# Patient Record
Sex: Male | Born: 1955 | Race: White | Hispanic: No | Marital: Married | Smoking: Never smoker
Health system: Southern US, Community
[De-identification: ages and names within clinical notes are randomized; demographics above are authoritative.]

## PROBLEM LIST (undated history)

## (undated) DIAGNOSIS — J309 Allergic rhinitis, unspecified: Secondary | ICD-10-CM

## (undated) DIAGNOSIS — K219 Gastro-esophageal reflux disease without esophagitis: Secondary | ICD-10-CM

## (undated) DIAGNOSIS — R809 Proteinuria, unspecified: Secondary | ICD-10-CM

## (undated) DIAGNOSIS — R638 Other symptoms and signs concerning food and fluid intake: Secondary | ICD-10-CM

## (undated) DIAGNOSIS — F419 Anxiety disorder, unspecified: Secondary | ICD-10-CM

## (undated) DIAGNOSIS — J449 Chronic obstructive pulmonary disease, unspecified: Secondary | ICD-10-CM

## (undated) DIAGNOSIS — G4730 Sleep apnea, unspecified: Secondary | ICD-10-CM

## (undated) DIAGNOSIS — G8929 Other chronic pain: Secondary | ICD-10-CM

## (undated) DIAGNOSIS — K2270 Barrett's esophagus without dysplasia: Secondary | ICD-10-CM

## (undated) DIAGNOSIS — H8109 Meniere's disease, unspecified ear: Secondary | ICD-10-CM

## (undated) DIAGNOSIS — IMO0001 Reserved for inherently not codable concepts without codable children: Secondary | ICD-10-CM

## (undated) DIAGNOSIS — F29 Unspecified psychosis not due to a substance or known physiological condition: Secondary | ICD-10-CM

## (undated) DIAGNOSIS — E7800 Pure hypercholesterolemia, unspecified: Secondary | ICD-10-CM

## (undated) DIAGNOSIS — E875 Hyperkalemia: Secondary | ICD-10-CM

## (undated) DIAGNOSIS — D649 Anemia, unspecified: Secondary | ICD-10-CM

## (undated) DIAGNOSIS — E785 Hyperlipidemia, unspecified: Secondary | ICD-10-CM

## (undated) DIAGNOSIS — Z9889 Other specified postprocedural states: Secondary | ICD-10-CM

## (undated) DIAGNOSIS — N2581 Secondary hyperparathyroidism of renal origin: Secondary | ICD-10-CM

## (undated) DIAGNOSIS — M5030 Other cervical disc degeneration, unspecified cervical region: Secondary | ICD-10-CM

## (undated) DIAGNOSIS — J40 Bronchitis, not specified as acute or chronic: Secondary | ICD-10-CM

## (undated) DIAGNOSIS — S060X9A Concussion with loss of consciousness of unspecified duration, initial encounter: Secondary | ICD-10-CM

## (undated) DIAGNOSIS — Z8669 Personal history of other diseases of the nervous system and sense organs: Secondary | ICD-10-CM

## (undated) DIAGNOSIS — C649 Malignant neoplasm of unspecified kidney, except renal pelvis: Secondary | ICD-10-CM

## (undated) DIAGNOSIS — G709 Myoneural disorder, unspecified: Secondary | ICD-10-CM

## (undated) DIAGNOSIS — N289 Disorder of kidney and ureter, unspecified: Secondary | ICD-10-CM

## (undated) DIAGNOSIS — M549 Dorsalgia, unspecified: Secondary | ICD-10-CM

## (undated) DIAGNOSIS — I2510 Atherosclerotic heart disease of native coronary artery without angina pectoris: Secondary | ICD-10-CM

## (undated) DIAGNOSIS — I10 Essential (primary) hypertension: Secondary | ICD-10-CM

## (undated) DIAGNOSIS — R1310 Dysphagia, unspecified: Secondary | ICD-10-CM

## (undated) DIAGNOSIS — E119 Type 2 diabetes mellitus without complications: Secondary | ICD-10-CM

## (undated) DIAGNOSIS — I219 Acute myocardial infarction, unspecified: Secondary | ICD-10-CM

## (undated) DIAGNOSIS — I209 Angina pectoris, unspecified: Secondary | ICD-10-CM

## (undated) HISTORY — PX: APPENDECTOMY: SHX54

## (undated) HISTORY — PX: CORONARY ANGIOPLASTY: SHX604

## (undated) HISTORY — PX: SPINAL CORD STIMULATOR IMPLANT: SHX2422

## (undated) HISTORY — PX: LABRINTHECTOMY: SHX1901

## (undated) HISTORY — PX: NEPHRECTOMY: SHX65

## (undated) HISTORY — DX: Concussion with loss of consciousness of unspecified duration, initial encounter: S06.0X9A

## (undated) HISTORY — DX: Unspecified psychosis not due to a substance or known physiological condition: F29

## (undated) HISTORY — PX: BACK SURGERY: SHX140

## (undated) HISTORY — DX: Chronic obstructive pulmonary disease, unspecified: J44.9

## (undated) HISTORY — PX: CARDIAC CATHETERIZATION: SHX172

## (undated) HISTORY — DX: Pure hypercholesterolemia, unspecified: E78.00

## (undated) HISTORY — DX: Personal history of other diseases of the nervous system and sense organs: Z86.69

## (undated) HISTORY — PX: CARDIOVASCULAR STRESS TEST: SHX262

## (undated) HISTORY — PX: OTHER SURGICAL HISTORY: SHX169

## (undated) HISTORY — DX: Reserved for inherently not codable concepts without codable children: IMO0001

## (undated) HISTORY — DX: Other symptoms and signs concerning food and fluid intake: R63.8

## (undated) HISTORY — DX: Other specified postprocedural states: Z98.890

## (undated) HISTORY — DX: Allergic rhinitis, unspecified: J30.9

---

## 2002-07-17 ENCOUNTER — Encounter: Payer: Self-pay | Admitting: Emergency Medicine

## 2002-07-17 ENCOUNTER — Emergency Department (HOSPITAL_COMMUNITY): Admission: AD | Admit: 2002-07-17 | Discharge: 2002-07-17 | Payer: Self-pay | Admitting: Emergency Medicine

## 2003-04-03 ENCOUNTER — Other Ambulatory Visit: Payer: Self-pay

## 2004-07-04 ENCOUNTER — Emergency Department: Payer: Self-pay | Admitting: Emergency Medicine

## 2004-08-10 ENCOUNTER — Ambulatory Visit: Payer: Self-pay | Admitting: Internal Medicine

## 2004-10-18 ENCOUNTER — Ambulatory Visit: Payer: Self-pay | Admitting: Unknown Physician Specialty

## 2004-10-31 ENCOUNTER — Ambulatory Visit: Payer: Self-pay | Admitting: Unknown Physician Specialty

## 2004-11-18 ENCOUNTER — Ambulatory Visit: Payer: Self-pay | Admitting: Unknown Physician Specialty

## 2004-12-19 ENCOUNTER — Ambulatory Visit: Payer: Self-pay | Admitting: Unknown Physician Specialty

## 2005-08-10 ENCOUNTER — Emergency Department: Payer: Self-pay | Admitting: Unknown Physician Specialty

## 2007-05-07 ENCOUNTER — Emergency Department: Payer: Self-pay | Admitting: Unknown Physician Specialty

## 2007-05-07 ENCOUNTER — Other Ambulatory Visit: Payer: Self-pay

## 2007-05-09 ENCOUNTER — Other Ambulatory Visit: Payer: Self-pay

## 2007-05-09 ENCOUNTER — Inpatient Hospital Stay: Payer: Self-pay | Admitting: Internal Medicine

## 2007-06-15 ENCOUNTER — Ambulatory Visit: Payer: Self-pay | Admitting: Anesthesiology

## 2007-07-14 ENCOUNTER — Ambulatory Visit: Payer: Self-pay | Admitting: Thoracic Surgery

## 2007-10-22 ENCOUNTER — Encounter: Payer: Self-pay | Admitting: Neurosurgery

## 2007-11-19 ENCOUNTER — Encounter: Payer: Self-pay | Admitting: Neurosurgery

## 2007-12-01 ENCOUNTER — Ambulatory Visit: Payer: Self-pay | Admitting: Anesthesiology

## 2007-12-20 ENCOUNTER — Encounter: Payer: Self-pay | Admitting: Neurosurgery

## 2007-12-29 ENCOUNTER — Ambulatory Visit: Payer: Self-pay | Admitting: Anesthesiology

## 2008-01-20 ENCOUNTER — Ambulatory Visit: Payer: Self-pay | Admitting: Anesthesiology

## 2008-02-25 ENCOUNTER — Encounter: Payer: Self-pay | Admitting: Orthopedic Surgery

## 2008-02-25 ENCOUNTER — Ambulatory Visit: Payer: Self-pay | Admitting: Anesthesiology

## 2008-04-07 ENCOUNTER — Ambulatory Visit: Payer: Self-pay | Admitting: Anesthesiology

## 2008-04-19 ENCOUNTER — Ambulatory Visit: Payer: Self-pay | Admitting: Unknown Physician Specialty

## 2008-05-16 ENCOUNTER — Ambulatory Visit: Payer: Self-pay | Admitting: Anesthesiology

## 2008-06-06 ENCOUNTER — Ambulatory Visit: Payer: Self-pay | Admitting: Unknown Physician Specialty

## 2008-06-24 ENCOUNTER — Ambulatory Visit: Payer: Self-pay | Admitting: Anesthesiology

## 2008-07-18 ENCOUNTER — Ambulatory Visit: Payer: Self-pay | Admitting: Anesthesiology

## 2008-08-18 ENCOUNTER — Ambulatory Visit: Payer: Self-pay | Admitting: Anesthesiology

## 2008-09-19 ENCOUNTER — Ambulatory Visit: Payer: Self-pay | Admitting: Anesthesiology

## 2008-11-14 ENCOUNTER — Ambulatory Visit: Payer: Self-pay | Admitting: Pain Medicine

## 2008-11-28 ENCOUNTER — Ambulatory Visit: Payer: Self-pay | Admitting: Pain Medicine

## 2009-02-20 ENCOUNTER — Ambulatory Visit: Payer: Self-pay | Admitting: Pain Medicine

## 2009-03-14 ENCOUNTER — Ambulatory Visit: Payer: Self-pay | Admitting: Physician Assistant

## 2009-04-12 ENCOUNTER — Ambulatory Visit: Payer: Self-pay | Admitting: Pain Medicine

## 2009-04-27 ENCOUNTER — Ambulatory Visit: Payer: Self-pay | Admitting: Pain Medicine

## 2009-05-10 ENCOUNTER — Ambulatory Visit: Payer: Self-pay | Admitting: Pain Medicine

## 2009-05-18 ENCOUNTER — Encounter: Payer: Self-pay | Admitting: Rehabilitation

## 2009-05-19 ENCOUNTER — Encounter: Payer: Self-pay | Admitting: Rehabilitation

## 2009-06-01 ENCOUNTER — Ambulatory Visit: Payer: Self-pay | Admitting: Pain Medicine

## 2009-06-18 ENCOUNTER — Encounter: Payer: Self-pay | Admitting: Rehabilitation

## 2009-07-10 ENCOUNTER — Ambulatory Visit: Payer: Self-pay | Admitting: Pain Medicine

## 2009-12-07 ENCOUNTER — Ambulatory Visit: Payer: Self-pay | Admitting: Pain Medicine

## 2009-12-20 ENCOUNTER — Ambulatory Visit: Payer: Self-pay | Admitting: Pain Medicine

## 2010-02-18 DIAGNOSIS — I219 Acute myocardial infarction, unspecified: Secondary | ICD-10-CM

## 2010-02-18 HISTORY — DX: Acute myocardial infarction, unspecified: I21.9

## 2010-06-04 ENCOUNTER — Ambulatory Visit: Payer: Self-pay | Admitting: Unknown Physician Specialty

## 2010-06-05 LAB — PATHOLOGY REPORT

## 2010-06-10 ENCOUNTER — Observation Stay: Payer: Self-pay | Admitting: Internal Medicine

## 2010-06-19 ENCOUNTER — Ambulatory Visit: Payer: Self-pay | Admitting: Internal Medicine

## 2010-07-03 ENCOUNTER — Encounter: Payer: Self-pay | Admitting: Internal Medicine

## 2010-07-03 NOTE — Letter (Signed)
Jul 14, 2007   Blima Dessert, MD  [address] [place], [place] [address]   Re:  Kenneth Osborn, Kenneth Osborn                 DOB:  [birth year]   Dear Dr. Arnoldo Morale:   I saw Kenneth Osborn in the office today.  This 55 year old patient was  worked up in [facility] for severe back pain 5 weeks ago.  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  He had an MRI which showed a  ruptured central disk at T8-9.  He saw you in consultation and was  recommended to have a diskectomy. In the meantime, he developed an upper  respiratory infection and went to [facility] where he was admitted  with questionable swine flu.  During that time, he had some workup there  and has been referred, I guess, to some other surgeon at [facility] for a  second opinion. He is having some weakness in his legs and numbness and  some urinary hesitancy and stumbling.  He feels that this pain is  getting worse.   PAST MEDICAL HISTORY:  Significant for:  1. Meniere's disease bilaterally for which he had a labyrinthectomy on      the right and some type of shunt on the left side.  2. 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  3. He has hypercholesterolemia.  4. Appendicitis.  5. Gastroesophageal reflux.  6. History of a fractured hip in the past.   ALLERGIES:  HYDROMORPHONE which causes a rash, but he was also taking  Dilaudid without problem.   MEDICATIONS:  1. Enalapril 2.5 mg day.  2. Triamterene 75/50 one daily.  3. Cyclobenzaprine 10 mg three times a day.  4. Diazepam 5 mg a day.  5. Meclizine  25 mg three times a  day.  6. Neurontin 1200 mg twice a day.  7. Lipitor 80 mg a day.  8. Nexium 40 mg twice a day.  9. Singulair 10 mg a day.  10.Lantus 50 units daily.  11.Janumet 40 mg twice a day.  12.Glyburide 4 mg daily.   FAMILY HISTORY:  Positive for vascular disease.   SOCIAL HISTORY:  He is married.  He is disabled and does not smoke or  drink.   REVIEW OF SYSTEMS:  He is 222 pounds and 5 feet 10 inches.  GENERAL:  His weights has been stable.  CARDIAC:  No angina or atrial  fibrillation.  PULMONARY:  No hemoptysis, fever, chills, asthma or  wheezing.  GI:  He has some reflux.  GU:  He has frequent urination.  No  dysuria or kidney disease.  VASCULAR:  No claudication, DVT, TIAs.  NEUROLOGICAL:  No dizziness, headaches, blackouts, seizures.  MUSCULOSKELETAL: No arthritis or joint pain.  PSYCHIATRIC:  No problems  with depression or nerves.  EYE AND ENT:  No changes in eyesight but see  Past Medical History or hearing.  HEMATOLOGIC:  No problems with  bleeding, clotting disorders or anemia.   PHYSICAL EXAMINATION:  VITAL SIGNS:  Blood pressure is 138/88, pulse  100, respirations 20, saturation 98%.  HEAD, EYES, EARS, NOSE AND THROAT:  Unremarkable except for surgical  scars on both his right this left ear in the mastoid area.  NECK:  Supple without thyromegaly.  There is no supraclavicular or  axillary adenopathy.  CHEST:  Clear to auscultation and  percussion.  HEART:  Regular sinus rhythm.  No murmurs.  ABDOMEN:  Soft.  There is no splenomegaly.  EXTREMITIES:  Pulses 2+.  There is no clubbing or edema.  NEUROLOGICAL:  He is oriented x3.  Sensory and motor intact.  Did not  have any definite weakness in either his right or his left leg.  SKIN:  Without lesions.   I discussed the thoracic exposure for Kenneth Osborn.  I think probably the  left side would be the better way to go, although we could also approach  this from the right side.  Essentially, the left would probably be the  better exposure.  I told that we would only have to do a rib resection  by another fusion.   I will be happy to do it at any time just as soon as he lets you know  when he wants to do surgery.  I appreciate the opportunity of seeing Mr.  Osborn.   Sincerely,   Nicanor Alcon, M.D.  Electronically Signed   DPB/MEDQ  D:  07/14/2007  T:   07/14/2007  Job:  [id number]

## 2010-07-20 ENCOUNTER — Encounter: Payer: Self-pay | Admitting: Internal Medicine

## 2010-08-06 ENCOUNTER — Emergency Department: Payer: Self-pay | Admitting: Emergency Medicine

## 2010-08-08 ENCOUNTER — Ambulatory Visit: Payer: Self-pay | Admitting: Pain Medicine

## 2010-08-14 ENCOUNTER — Ambulatory Visit: Payer: Self-pay | Admitting: Pain Medicine

## 2010-08-19 ENCOUNTER — Encounter: Payer: Self-pay | Admitting: Internal Medicine

## 2010-08-27 ENCOUNTER — Ambulatory Visit: Payer: Self-pay | Admitting: Pain Medicine

## 2010-09-17 ENCOUNTER — Ambulatory Visit: Payer: Self-pay | Admitting: Pain Medicine

## 2010-09-19 ENCOUNTER — Encounter: Payer: Self-pay | Admitting: Internal Medicine

## 2010-10-20 ENCOUNTER — Encounter: Payer: Self-pay | Admitting: Internal Medicine

## 2010-11-20 ENCOUNTER — Ambulatory Visit: Payer: Self-pay | Admitting: Internal Medicine

## 2010-11-23 ENCOUNTER — Other Ambulatory Visit: Payer: Self-pay

## 2010-12-03 ENCOUNTER — Encounter: Payer: Self-pay | Admitting: Internal Medicine

## 2010-12-10 ENCOUNTER — Ambulatory Visit: Payer: Self-pay | Admitting: Pain Medicine

## 2010-12-20 ENCOUNTER — Encounter: Payer: Self-pay | Admitting: Internal Medicine

## 2011-01-19 ENCOUNTER — Encounter: Payer: Self-pay | Admitting: Internal Medicine

## 2011-02-24 ENCOUNTER — Observation Stay: Payer: Self-pay | Admitting: Internal Medicine

## 2011-02-24 LAB — CBC
HCT: 44.1 % (ref 40.0–52.0)
HGB: 15 g/dL (ref 13.0–18.0)
MCH: 28.5 pg (ref 26.0–34.0)
MCHC: 34 g/dL (ref 32.0–36.0)
MCV: 84 fL (ref 80–100)
RBC: 5.26 10*6/uL (ref 4.40–5.90)

## 2011-02-24 LAB — CK TOTAL AND CKMB (NOT AT ARMC)
CK, Total: 147 U/L (ref 35–232)
CK-MB: 2.1 ng/mL (ref 0.5–3.6)

## 2011-02-24 LAB — BASIC METABOLIC PANEL
Anion Gap: 12 (ref 7–16)
BUN: 26 mg/dL — ABNORMAL HIGH (ref 7–18)
Creatinine: 1.61 mg/dL — ABNORMAL HIGH (ref 0.60–1.30)
EGFR (African American): 57 — ABNORMAL LOW
Osmolality: 294 (ref 275–301)

## 2011-02-24 LAB — TROPONIN I: Troponin-I: <0.02 ng/mL

## 2011-02-24 LAB — MAGNESIUM: Magnesium: 1.3 mg/dL — ABNORMAL LOW

## 2011-02-25 LAB — BASIC METABOLIC PANEL
BUN: 27 mg/dL — ABNORMAL HIGH (ref 7–18)
Chloride: 103 mmol/L (ref 98–107)
Creatinine: 1.46 mg/dL — ABNORMAL HIGH (ref 0.60–1.30)
Potassium: 4.2 mmol/L (ref 3.5–5.1)
Sodium: 142 mmol/L (ref 136–145)

## 2011-02-25 LAB — CBC WITH DIFFERENTIAL/PLATELET
Basophil %: 0.7 %
Eosinophil %: 2 %
HCT: 40.3 % (ref 40.0–52.0)
HGB: 13.8 g/dL (ref 13.0–18.0)
Lymphocyte %: 47.8 %
MCH: 28.7 pg (ref 26.0–34.0)
MCHC: 34.3 g/dL (ref 32.0–36.0)
Monocyte #: 0.7 10*3/uL (ref 0.0–0.7)
Neutrophil #: 3.2 10*3/uL (ref 1.4–6.5)
RBC: 4.82 10*6/uL (ref 4.40–5.90)

## 2011-02-25 LAB — TROPONIN I
Troponin-I: <0.02 ng/mL
Troponin-I: <0.02 ng/mL

## 2011-02-25 LAB — APTT: Activated PTT: 88.7 secs — ABNORMAL HIGH (ref 23.6–35.9)

## 2011-03-11 ENCOUNTER — Ambulatory Visit: Payer: Self-pay | Admitting: Pain Medicine

## 2011-03-19 ENCOUNTER — Encounter: Payer: Self-pay | Admitting: Internal Medicine

## 2011-03-22 ENCOUNTER — Encounter: Payer: Self-pay | Admitting: Internal Medicine

## 2011-04-19 ENCOUNTER — Encounter: Payer: Self-pay | Admitting: Internal Medicine

## 2011-06-19 ENCOUNTER — Ambulatory Visit: Payer: Self-pay | Admitting: Pain Medicine

## 2011-10-09 ENCOUNTER — Ambulatory Visit: Payer: Self-pay | Admitting: Pain Medicine

## 2012-02-05 ENCOUNTER — Ambulatory Visit: Payer: Self-pay | Admitting: Pain Medicine

## 2012-02-10 ENCOUNTER — Ambulatory Visit: Payer: Self-pay

## 2012-03-16 DIAGNOSIS — M25519 Pain in unspecified shoulder: Secondary | ICD-10-CM | POA: Insufficient documentation

## 2012-03-25 ENCOUNTER — Other Ambulatory Visit: Payer: Self-pay | Admitting: Orthopedic Surgery

## 2012-04-01 ENCOUNTER — Encounter (HOSPITAL_COMMUNITY)
Admission: RE | Admit: 2012-04-01 | Discharge: 2012-04-01 | Disposition: A | Discharge status: Home / Self Care | Payer: Managed Care, Other (non HMO) | Source: Ambulatory Visit | Attending: Orthopedic Surgery | Admitting: Orthopedic Surgery

## 2012-04-01 ENCOUNTER — Encounter (HOSPITAL_COMMUNITY)
Admission: RE | Admit: 2012-04-01 | Discharge: 2012-04-01 | Disposition: A | Discharge status: Home / Self Care | Payer: Managed Care, Other (non HMO) | Source: Ambulatory Visit | Attending: Anesthesiology | Admitting: Anesthesiology

## 2012-04-01 ENCOUNTER — Encounter (HOSPITAL_COMMUNITY): Payer: Self-pay | Admitting: Pharmacy Technician

## 2012-04-01 ENCOUNTER — Encounter (HOSPITAL_COMMUNITY): Payer: Self-pay

## 2012-04-01 HISTORY — DX: Atherosclerotic heart disease of native coronary artery without angina pectoris: I25.10

## 2012-04-01 HISTORY — DX: Meniere's disease, unspecified ear: H81.09

## 2012-04-01 HISTORY — DX: Dorsalgia, unspecified: M54.9

## 2012-04-01 HISTORY — DX: Essential (primary) hypertension: I10

## 2012-04-01 HISTORY — DX: Other chronic pain: G89.29

## 2012-04-01 HISTORY — DX: Bronchitis, not specified as acute or chronic: J40

## 2012-04-01 HISTORY — DX: Acute myocardial infarction, unspecified: I21.9

## 2012-04-01 HISTORY — DX: Gastro-esophageal reflux disease without esophagitis: K21.9

## 2012-04-01 HISTORY — DX: Sleep apnea, unspecified: G47.30

## 2012-04-01 HISTORY — DX: Myoneural disorder, unspecified: G70.9

## 2012-04-01 HISTORY — DX: Type 2 diabetes mellitus without complications: E11.9

## 2012-04-01 LAB — SURGICAL PCR SCREEN: Staphylococcus aureus: POSITIVE — AB

## 2012-04-01 LAB — PROTIME-INR: Prothrombin Time: 12 seconds (ref 11.6–15.2)

## 2012-04-01 LAB — BASIC METABOLIC PANEL
BUN: 24 mg/dL — ABNORMAL HIGH (ref 6–23)
Calcium: 10.2 mg/dL (ref 8.4–10.5)
Creatinine, Ser: 1.13 mg/dL (ref 0.50–1.35)
GFR calc non Af Amer: 70 mL/min — ABNORMAL LOW (ref >90)
Glucose, Bld: 137 mg/dL — ABNORMAL HIGH (ref 70–99)
Potassium: 4.3 mEq/L (ref 3.5–5.1)

## 2012-04-01 LAB — CBC
Hemoglobin: 15.2 g/dL (ref 13.0–17.0)
MCH: 28.1 pg (ref 26.0–34.0)
MCHC: 34.9 g/dL (ref 30.0–36.0)
RDW: 13.5 % (ref 11.5–15.5)

## 2012-04-01 MED ORDER — CHLORHEXIDINE GLUCONATE 4 % EX LIQD: 60.0000 mL | Freq: Once | CUTANEOUS | Status: DC

## 2012-04-01 NOTE — Pre-Procedure Instructions (Signed)
Kamen Hanken  04/01/2012   Your procedure is scheduled on:  Monday April 06, 2012  Report to [facility] at 10:00 AM.  Call this number if you have problems the morning of surgery: [phone]   Remember:   Do not eat food or drink liquids after midnight.   Take these medicines the morning of surgery with A SIP OF WATER: nexium, gabapentin, oxycodone, Nitrostat ( if needed), meclizine, diazepam, certirizine, metoprolol, dilitazem, isosorbide   Do not wear jewelry, make-up or nail polish.  Do not wear lotions, powders, or perfumes.  Do not shave 48 hours prior to surgery. Men may shave face and neck.  Do not bring valuables to the hospital.  Contacts, dentures or bridgework may not be worn into surgery.  Leave suitcase in the car. After surgery it may be brought to your room.  For patients admitted to the hospital, checkout time is 11:00 AM the day of  discharge.   Patients discharged the day of surgery will not be allowed to drive home.  Name and phone number of your driver: family / friend  Special Instructions: Shower using CHG 2 nights before surgery and the night before surgery.  If you shower the day of surgery use CHG.  Use special wash - you have one bottle of CHG for all showers.  You should use approximately 1/3 of the bottle for each shower.   Please read over the following fact sheets that you were given: Pain Booklet, Coughing and Deep Breathing, MRSA Information and Surgical Site Infection Prevention

## 2012-04-01 NOTE — Progress Notes (Signed)
Faxed Request to Dr. Ander Purpura office, requesting last office visit notes, EKG, stress test, ECho, and cardiac cath if available.

## 2012-04-02 ENCOUNTER — Encounter (HOSPITAL_COMMUNITY): Payer: Self-pay

## 2012-04-02 NOTE — Consult Note (Signed)
Anesthesia Chart Review:  Patient is a 57 year old male scheduled for left shoulder arthroscopy, subacromial decompression by Dr. Ronnie Derby on 04/06/12.  History includes obesity, non-smoker, CAD/MI s/p LAD stent with PCI LAD for restenosis '11/2010, DM2, HTN, GERD, OSA.  Cardiologist is Dr. [name], records pending, but Dr. Ruel Favors office did receive a note of cardiac clearance with permission for Effient to be held 5-7 days preoperatively.  EKG on 04/01/12 showed NSR, cannot rule out anterior infarct (age undetermined).  Poor r wave progression appears new since his EKG on 02/26/11 [facility]).  Cardiac cath on 02/25/11 [facility]) showed Proximal LAD:  10% stenosis at the site of a prior stent.  Mid LAD:  60% stenosis.  1st DIAG:  60% stenosis.  Proximal CX: 70% stenosis.  Mid CX: 60% stenosis.  OM1: 30% stenosis.  Proximal RCA 30% stenosis.  Recommendations: "Continue medical mgt of cad with consider pci of lcx artery at this time with concerns for small caliber artery."  He had a normal nuclear stress test at [facility] on 04/12/10 showing no evidence of ischemia, EF 72%.  CXR on 04/01/12 showed minimal chronic bronchitic changes.  Preoperative labs noted.  Additional cardiac records are pending from [facility], but his last cath and clearance note are on his surgical chart.  Additional records, if received, can be reviewed by his anesthesiologist on the day of surgery.  Myra Gianotti, PA-C 04/03/12 1138

## 2012-04-06 ENCOUNTER — Ambulatory Visit (HOSPITAL_COMMUNITY)
Admission: RE | Admit: 2012-04-06 | Discharge: 2012-04-06 | Disposition: A | Discharge status: Home / Self Care | Payer: Managed Care, Other (non HMO) | Source: Ambulatory Visit | Attending: Orthopedic Surgery | Admitting: Orthopedic Surgery

## 2012-04-06 ENCOUNTER — Encounter (HOSPITAL_COMMUNITY): Payer: Self-pay | Admitting: Vascular Surgery

## 2012-04-06 ENCOUNTER — Ambulatory Visit (HOSPITAL_COMMUNITY): Payer: Managed Care, Other (non HMO) | Admitting: Vascular Surgery

## 2012-04-06 ENCOUNTER — Encounter (HOSPITAL_COMMUNITY): Payer: Self-pay | Admitting: *Deleted

## 2012-04-06 ENCOUNTER — Encounter (HOSPITAL_COMMUNITY)
Admission: RE | Disposition: A | Discharge status: Home / Self Care | Payer: Self-pay | Source: Ambulatory Visit | Attending: Orthopedic Surgery

## 2012-04-06 DIAGNOSIS — Z794 Long term (current) use of insulin: Secondary | ICD-10-CM | POA: Insufficient documentation

## 2012-04-06 DIAGNOSIS — M19019 Primary osteoarthritis, unspecified shoulder: Secondary | ICD-10-CM | POA: Insufficient documentation

## 2012-04-06 DIAGNOSIS — I10 Essential (primary) hypertension: Secondary | ICD-10-CM | POA: Insufficient documentation

## 2012-04-06 DIAGNOSIS — E119 Type 2 diabetes mellitus without complications: Secondary | ICD-10-CM | POA: Insufficient documentation

## 2012-04-06 DIAGNOSIS — Z01818 Encounter for other preprocedural examination: Secondary | ICD-10-CM | POA: Insufficient documentation

## 2012-04-06 DIAGNOSIS — M25519 Pain in unspecified shoulder: Secondary | ICD-10-CM

## 2012-04-06 DIAGNOSIS — Z01812 Encounter for preprocedural laboratory examination: Secondary | ICD-10-CM | POA: Insufficient documentation

## 2012-04-06 DIAGNOSIS — M25819 Other specified joint disorders, unspecified shoulder: Secondary | ICD-10-CM | POA: Insufficient documentation

## 2012-04-06 DIAGNOSIS — Z01810 Encounter for preprocedural cardiovascular examination: Secondary | ICD-10-CM | POA: Insufficient documentation

## 2012-04-06 HISTORY — PX: SHOULDER ARTHROSCOPY WITH SUBACROMIAL DECOMPRESSION: SHX5684

## 2012-04-06 LAB — APTT: aPTT: 27 seconds (ref 24–37)

## 2012-04-06 LAB — GLUCOSE, CAPILLARY: Glucose-Capillary: 136 mg/dL — ABNORMAL HIGH (ref 70–99)

## 2012-04-06 SURGERY — SHOULDER ARTHROSCOPY WITH SUBACROMIAL DECOMPRESSION
Anesthesia: Regional | Site: Shoulder | Laterality: Left | Wound class: Clean

## 2012-04-06 MED ORDER — STERILE WATER FOR IRRIGATION IR SOLN
Status: DC | PRN
  Administered 2012-04-06: 1000 mL

## 2012-04-06 MED ORDER — BUPIVACAINE HCL (PF) 0.5 % IJ SOLN
INTRAMUSCULAR | Status: DC | PRN
  Administered 2012-04-06: 30 mL

## 2012-04-06 MED ORDER — LACTATED RINGERS IV SOLN
INTRAVENOUS | Status: DC
  Administered 2012-04-06: 11:00:00 via INTRAVENOUS

## 2012-04-06 MED ORDER — FENTANYL CITRATE 0.05 MG/ML IJ SOLN
INTRAMUSCULAR | Status: AC
  Administered 2012-04-06: 100 ug via INTRAVENOUS
  Filled 2012-04-06: qty 2

## 2012-04-06 MED ORDER — FENTANYL CITRATE 0.05 MG/ML IJ SOLN
50.0000 ug | INTRAMUSCULAR | Status: DC | PRN
  Administered 2012-04-06: 100 ug via INTRAVENOUS

## 2012-04-06 MED ORDER — FENTANYL CITRATE 0.05 MG/ML IJ SOLN
INTRAMUSCULAR | Status: DC | PRN
  Administered 2012-04-06: 150 ug via INTRAVENOUS

## 2012-04-06 MED ORDER — PROPOFOL 10 MG/ML IV BOLUS
INTRAVENOUS | Status: DC | PRN
  Administered 2012-04-06: 200 mg via INTRAVENOUS

## 2012-04-06 MED ORDER — LACTATED RINGERS IV SOLN
INTRAVENOUS | Status: DC | PRN
  Administered 2012-04-06 (×2): via INTRAVENOUS

## 2012-04-06 MED ORDER — NEOSTIGMINE METHYLSULFATE 1 MG/ML IJ SOLN
INTRAMUSCULAR | Status: DC | PRN
  Administered 2012-04-06: 3 mg via INTRAVENOUS

## 2012-04-06 MED ORDER — MUPIROCIN 2 % EX OINT
TOPICAL_OINTMENT | Freq: Two times a day (BID) | CUTANEOUS | Status: DC
  Administered 2012-04-06: 1 via NASAL
  Filled 2012-04-06: qty 22

## 2012-04-06 MED ORDER — MIDAZOLAM HCL 2 MG/2ML IJ SOLN
INTRAMUSCULAR | Status: AC
  Administered 2012-04-06: 2 mg via INTRAVENOUS
  Filled 2012-04-06: qty 2

## 2012-04-06 MED ORDER — LIDOCAINE HCL (CARDIAC) 20 MG/ML IV SOLN
INTRAVENOUS | Status: DC | PRN
  Administered 2012-04-06: 50 mg via INTRAVENOUS

## 2012-04-06 MED ORDER — GLYCOPYRROLATE 0.2 MG/ML IJ SOLN
INTRAMUSCULAR | Status: DC | PRN
  Administered 2012-04-06: 0.6 mg via INTRAVENOUS

## 2012-04-06 MED ORDER — MIDAZOLAM HCL 2 MG/2ML IJ SOLN
1.0000 mg | INTRAMUSCULAR | Status: DC | PRN
  Administered 2012-04-06: 2 mg via INTRAVENOUS

## 2012-04-06 MED ORDER — MUPIROCIN 2 % EX OINT
TOPICAL_OINTMENT | CUTANEOUS | Status: AC
  Administered 2012-04-06: 1 via NASAL
  Filled 2012-04-06: qty 22

## 2012-04-06 MED ORDER — ROCURONIUM BROMIDE 100 MG/10ML IV SOLN
INTRAVENOUS | Status: DC | PRN
  Administered 2012-04-06: 50 mg via INTRAVENOUS

## 2012-04-06 SURGICAL SUPPLY — 43 items
BIT DRILL TAK (DRILL) IMPLANT
BLADE CUTTER GATOR 3.5 (BLADE) ×2 IMPLANT
BLADE SURG 11 STRL SS (BLADE) ×2 IMPLANT
BUR OVAL 4.0 (BURR) ×2 IMPLANT
CANNULA SHOULDER 7CM (CANNULA) ×2 IMPLANT
CLOTH BEACON ORANGE TIMEOUT ST (SAFETY) ×2 IMPLANT
CLSR STERI-STRIP ANTIMIC 1/2X4 (GAUZE/BANDAGES/DRESSINGS) ×2 IMPLANT
DRAPE U-SHAPE 47X51 STRL (DRAPES) ×2 IMPLANT
DRILL TAK (DRILL)
DRSG EMULSION OIL 3X3 NADH (GAUZE/BANDAGES/DRESSINGS) ×2 IMPLANT
DRSG PAD ABDOMINAL 8X10 ST (GAUZE/BANDAGES/DRESSINGS) ×2 IMPLANT
DURAPREP 26ML APPLICATOR (WOUND CARE) ×2 IMPLANT
GLOVE BIO SURGEON STRL SZ8.5 (GLOVE) ×2 IMPLANT
GLOVE BIOGEL PI IND STRL 8.5 (GLOVE) ×1 IMPLANT
GLOVE BIOGEL PI INDICATOR 8.5 (GLOVE) ×1
GLOVE SURG ORTHO 8.0 STRL STRW (GLOVE) ×4 IMPLANT
GLOVE SURG SS PI 8.5 STRL IVOR (GLOVE) ×1
GLOVE SURG SS PI 8.5 STRL STRW (GLOVE) ×1 IMPLANT
GOWN PREVENTION PLUS XLARGE (GOWN DISPOSABLE) ×2 IMPLANT
GOWN PREVENTION PLUS XXLARGE (GOWN DISPOSABLE) ×2 IMPLANT
GOWN STRL NON-REIN LRG LVL3 (GOWN DISPOSABLE) ×2 IMPLANT
KIT BASIN OR (CUSTOM PROCEDURE TRAY) ×2 IMPLANT
KIT ROOM TURNOVER OR (KITS) ×2 IMPLANT
MANIFOLD NEPTUNE II (INSTRUMENTS) ×2 IMPLANT
NEEDLE HYPO 25GX1X1/2 BEV (NEEDLE) ×2 IMPLANT
NEEDLE SPNL 18GX3.5 QUINCKE PK (NEEDLE) ×2 IMPLANT
PACK SHOULDER (CUSTOM PROCEDURE TRAY) ×2 IMPLANT
PAD ARMBOARD 7.5X6 YLW CONV (MISCELLANEOUS) ×4 IMPLANT
SET ARTHROSCOPY TUBING (MISCELLANEOUS) ×1
SET ARTHROSCOPY TUBING LN (MISCELLANEOUS) ×1 IMPLANT
SLING ARM FOAM STRAP LRG (SOFTGOODS) ×2 IMPLANT
SPEAR FASTAKII (SLEEVE) IMPLANT
SPONGE GAUZE 4X4 12PLY (GAUZE/BANDAGES/DRESSINGS) ×2 IMPLANT
SPONGE LAP 4X18 X RAY DECT (DISPOSABLE) ×4 IMPLANT
STRIP CLOSURE SKIN 1/2X4 (GAUZE/BANDAGES/DRESSINGS) IMPLANT
SUT ETHIBOND 2 OS 4 DA (SUTURE) IMPLANT
SUT ETHILON 3 0 PS 1 (SUTURE) ×2 IMPLANT
SYR 50ML SLIP (SYRINGE) IMPLANT
SYR CONTROL 10ML LL (SYRINGE) IMPLANT
TAPE CLOTH SURG 6X10 WHT LF (GAUZE/BANDAGES/DRESSINGS) ×2 IMPLANT
TOWEL OR 17X24 6PK STRL BLUE (TOWEL DISPOSABLE) ×4 IMPLANT
WAND 90 DEG TURBOVAC W/CORD (SURGICAL WAND) ×2 IMPLANT
WATER STERILE IRR 1000ML POUR (IV SOLUTION) ×2 IMPLANT

## 2012-04-06 NOTE — Anesthesia Procedure Notes (Addendum)
Procedure Name: Intubation Date/Time: 04/06/2012 12:22 PM Performed by: Eligha Bridegroom Pre-anesthesia Checklist: Patient identified, Timeout performed, Emergency Drugs available, Suction available and Patient being monitored Patient Re-evaluated:Patient Re-evaluated prior to inductionOxygen Delivery Method: Circle system utilized Preoxygenation: Pre-oxygenation with 100% oxygen Intubation Type: IV induction Ventilation: Mask ventilation without difficulty and Oral airway inserted - appropriate to patient size Laryngoscope Size: Mac and 4 Grade View: Grade I Tube type: Oral Number of attempts: 1 Airway Equipment and Method: Stylet Placement Confirmation: ETT inserted through vocal cords under direct vision,  breath sounds checked- equal and bilateral and positive ETCO2 Secured at: 22 cm Tube secured with: Tape Dental Injury: Teeth and Oropharynx as per pre-operative assessment     Anesthesia Regional Block:   Narrative:    Anesthesia Regional Block:  Interscalene brachial plexus block  Pre-Anesthetic Checklist: ,, timeout performed, Correct Patient, Correct Site, Correct Laterality, Correct Procedure, Correct Position, site marked, Risks and benefits discussed,  Surgical consent,  Pre-op evaluation,  At surgeon's request and post-op pain management  Laterality: Left  Prep: chloraprep       Needles:  Injection technique: Single-shot  Needle Type: Echogenic Stimulator Needle     Needle Length: 5cm 5 cm Needle Gauge: 21 G    Additional Needles:  Procedures: ultrasound guided (picture in chart) and nerve stimulator Interscalene brachial plexus block  Nerve Stimulator or Paresthesia:  Response: 0.4 mA,   Additional Responses:   Narrative:  Start time: 04/06/2012 12:00 PM End time: 04/06/2012 12:15 PM Injection made incrementally with aspirations every 5 mL.  Performed by: Personally  Anesthesiologist: Lillia Abed MD  Additional Notes: Monitors applied. Patient  sedated. Sterile prep and drape,hand hygiene and sterile gloves were used. Relevant anatomy identified.Needle position confirmed.Local anesthetic injected incrementally after negative aspiration. Local anesthetic spread visualized around nerve(s). Vascular puncture avoided. No complications. Image printed for medical record.The patient tolerated the procedure well.       Interscalene brachial plexus block

## 2012-04-06 NOTE — Transfer of Care (Signed)
Immediate Anesthesia Transfer of Care Note  Patient: Kenneth Osborn  Procedure(s) Performed: Procedure(s) with comments: SHOULDER ARTHROSCOPY WITH SUBACROMIAL DECOMPRESSION (Left) - left shoulder arthroscopy, subacromial decompression and distal clavicle resection  Patient Location: PACU  Anesthesia Type:GA combined with regional for post-op pain  Level of Consciousness: awake, alert  and oriented  Airway & Oxygen Therapy: Patient Spontanous Breathing and Patient connected to nasal cannula oxygen  Post-op Assessment: Report given to PACU RN and Post -op Vital signs reviewed and stable  Post vital signs: Reviewed and stable  Complications: No apparent anesthesia complications

## 2012-04-06 NOTE — Preoperative (Signed)
Beta Blockers   Reason not to administer Beta Blockers:Not Applicable 

## 2012-04-06 NOTE — Progress Notes (Signed)
Ted hose measured and given, also IS teaching done and given for home use

## 2012-04-06 NOTE — Anesthesia Preprocedure Evaluation (Signed)
Anesthesia Evaluation  Patient identified by MRN, date of birth, ID band Patient awake    Reviewed: Allergy & Precautions, H&P , NPO status , Patient's Chart, lab work & pertinent test results  Airway Mallampati: I TM Distance: >3 FB Neck ROM: Full    Dental   Pulmonary sleep apnea ,          Cardiovascular hypertension, Pt. on medications + CAD     Neuro/Psych    GI/Hepatic GERD-  Medicated and Controlled,  Endo/Other  diabetes, Well Controlled, Type 2, Insulin Dependent and Oral Hypoglycemic Agents  Renal/GU      Musculoskeletal   Abdominal   Peds  Hematology   Anesthesia Other Findings   Reproductive/Obstetrics                           Anesthesia Physical Anesthesia Plan  ASA: III  Anesthesia Plan: General   Post-op Pain Management:    Induction: Intravenous  Airway Management Planned: Oral ETT  Additional Equipment:   Intra-op Plan:   Post-operative Plan: Extubation in OR  Informed Consent: I have reviewed the patients History and Physical, chart, labs and discussed the procedure including the risks, benefits and alternatives for the proposed anesthesia with the patient or authorized representative who has indicated his/her understanding and acceptance.     Plan Discussed with: CRNA and Surgeon  Anesthesia Plan Comments:         Anesthesia Quick Evaluation

## 2012-04-06 NOTE — Anesthesia Postprocedure Evaluation (Signed)
Anesthesia Post Note  Patient: Kenneth Osborn  Procedure(s) Performed: Procedure(s) (LRB): SHOULDER ARTHROSCOPY WITH SUBACROMIAL DECOMPRESSION (Left)  Anesthesia type: general  Patient location: PACU  Post pain: Pain level controlled  Post assessment: Patient's Cardiovascular Status Stable  Last Vitals:  Filed Vitals:   04/06/12 1426  BP: 140/79  Pulse: 76  Temp: 36.4 C  Resp: 18    Post vital signs: Reviewed and stable  Level of consciousness: sedated  Complications: No apparent anesthesia complications

## 2012-04-06 NOTE — H&P (Signed)
NAME: Kenneth Osborn MRN:   [id number] DOB:   1955-06-09     HISTORY AND PHYSICAL  CHIEF COMPLAINT:  Left shoulder pain  HISTORY:   Kenneth Osborn a 57 y.o. male  with left  Shoulder Pain Patient complains of left shoulder pain. The symptoms began several months ago. Aggravating factors: repetitive activity. Pain is located around the acromioclavicular [facility]) joint. Discomfort is described as sharp/stabbing. Symptoms are exacerbated by repetitive movements and overhead movements. Evaluation to date: plain films: abnormal ac osteoarthritis and MRI: abnormal partial rotator cuff tear. Therapy to date includes: rest, ice, avoidance of offending activity, OTC analgesics which are somewhat effective, home exercises which are somewhat effective, physical therapy which was somewhat effective and corticosteroid injection which was somewhat effective.     Plan for left sad dcr  PAST MEDICAL HISTORY:   Past Medical History  Diagnosis Date  . Myocardial infarction     Sees Dr. Drema [place], [place] [facility]  . Coronary artery disease     99% blockage  . Hypertension     sees Dr. Jenny Reichmann walker Jefm Bryant  . 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 Bronchitis     hx of  . Diabetes mellitus without complication     insulin dependent  . GERD (gastroesophageal reflux disease)   . Neuromuscular disorder     diabetic neuropathy in feet  . Chronic back pain     thoracic area  . Sleep apnea     uses cpap and 2 L o2 at hs    PAST SURGICAL HISTORY:   Past Surgical History  Procedure Laterality Date  . Cardiovascular stress test      jan 2014  . Cardiac catheterization      may 2012 and Nov 20, 2010  . Coronary angioplasty      stent placement  . Labrinthectomy      1999 right ear  . Mastoid shunt      left, 2002, 1997 right ear, 1980 right ear  . Appendectomy    . Back surgery      fusion thoracic area    MEDICATIONS:   No prescriptions prior to admission    ALLERGIES:   No Known Allergies  REVIEW OF SYSTEMS:   Negative except none  FAMILY HISTORY:  No family history on file.  SOCIAL HISTORY:   reports that he has never smoked. He does not have any smokeless tobacco history on file. He reports that he does not drink alcohol or use illicit drugs.  PHYSICAL EXAM:  General appearance: alert, cooperative and no distress Resp: clear to auscultation bilaterally Cardio: regular rate and rhythm, S1, S2 normal, no murmur, click, rub or gallop GI: soft, non-tender; bowel sounds normal; no masses,  no organomegaly Extremities: extremities normal, atraumatic, no cyanosis or edema and Homans sign is negative, no sign of DVT Pulses: 2+ and symmetric Skin: Skin color, texture, turgor normal. No rashes or lesions Neurologic: Alert and oriented X 3, normal strength and tone. Normal symmetric reflexes. Normal coordination and gait    LABORATORY STUDIES: No results found for this basename: WBC, HGB, HCT, PLT,  in the last 72 hours  No results found for this basename: NA, K, CL, CO2, GLUCOSE, BUN, CREATININE, CALCIUM,  in the last 72 hours  STUDIES/RESULTS:  Dg Chest 2 View  04/01/2012  *RADIOLOGY REPORT*  Clinical Data: Preoperative assessment for left shoulder arthroscopy, history hypertension, diabetes, MI, GERD, sleep apnea, coronary artery disease  CHEST - 2 VIEW  Comparison: 02/06/2012  Findings: Normal heart size, mediastinal contours, and pulmonary vascularity. Minimal chronic peribronchial thickening. Lungs otherwise clear. No pleural effusion or pneumothorax. No acute osseous findings.  IMPRESSION: Minimal chronic bronchitic changes.   Original Report Authenticated By: Lavonia Dana, M.D.     ASSESSMENT:   left shoulder pain        Active Problems:   * No active hospital problems. *    PLAN:  Left subacromial decompression  Trinadee Verhagen 04/06/2012. 7:01 AM

## 2012-04-08 ENCOUNTER — Encounter (HOSPITAL_COMMUNITY): Payer: Self-pay | Admitting: Orthopedic Surgery

## 2012-04-09 NOTE — Op Note (Signed)
  Dictation Number:  [id number]

## 2012-04-10 NOTE — Op Note (Signed)
NAMEJARMEL, LINHARDT NO.:  [id number]  MEDICAL RECORD NO.:  [id number]  LOCATION:  [facility]                         FACILITY:  [facility]  PHYSICIAN:  Estill Bamberg. Ronnie Derby, M.D. DATE OF BIRTH:  1956-01-29  DATE OF PROCEDURE:  04/06/2012 DATE OF DISCHARGE:  04/06/2012                              OPERATIVE REPORT   SURGEON:  Estill Bamberg. Ronnie Derby, M.D.  ASSISTANT:  [name], PA-C.  ANESTHESIA:  General.  PREOPERATIVE DIAGNOSIS:  Left shoulder impingement syndrome, acromioclavicular joint arthritis.  POSTOPERATIVE DIAGNOSIS:  Left shoulder impingement syndrome, acromioclavicular joint arthritis.  PROCEDURE:  Left shoulder arthroscopy, subacromial decompression, distal clavicle resection.  INDICATION FOR PROCEDURE:  The patient is a 57 year old white male with failure of conservative measures for shoulder impingement syndrome. Informed consent was obtained.  DESCRIPTION OF PROCEDURE:  The patient was laid supine and administered general anesthesia.  He was placed in beach chair position.  Left shoulder was prepped and draped in usual sterile fashion.  Inferolateral __________ portals were created with an #11 blade, blunt trocar, and cannula.  Diagnostic arthroscopy revealed minimal chondromalacia of the joint.  There was a little of antral labral tearing which was debrided through the anterior portal.  I then redirected the scope into the subacromial space from the posterior portal.  From the direct lateral portal, I performed a bursectomy.  I then released the ArthroCare debridement wand to clean off the undersurface of the distal clavicle and acromion __________ ligament.  I then used a 4.8 mm cylindrical bur to perform aggressive acromioplasty and distal clavicle resection, removing 8 to 10-mm of bone off the distal clavicle.  I then completed the acromioplasty of distal clavicle.  I then lavaged the joint.  The subacromial space was then closed with 4-0 nylon  suture.  Dressed with Xeroform, dressing sponges, and a new shoulder dressing.  COMPLICATIONS:  None.  DRAINS:  None.         ______________________________ Estill Bamberg. Ronnie Derby, M.D.    SDL/MEDQ  D:  04/09/2012  T:  04/10/2012  Job:  [id number]

## 2012-06-01 ENCOUNTER — Ambulatory Visit: Payer: Self-pay | Admitting: Pain Medicine

## 2012-09-25 ENCOUNTER — Ambulatory Visit: Payer: Self-pay | Admitting: Pain Medicine

## 2012-09-25 ENCOUNTER — Other Ambulatory Visit: Payer: Self-pay | Admitting: Pain Medicine

## 2012-09-25 LAB — MAGNESIUM: Magnesium: 1.5 mg/dL — ABNORMAL LOW

## 2012-12-11 DIAGNOSIS — M4802 Spinal stenosis, cervical region: Secondary | ICD-10-CM | POA: Insufficient documentation

## 2012-12-30 DIAGNOSIS — H81319 Aural vertigo, unspecified ear: Secondary | ICD-10-CM | POA: Insufficient documentation

## 2012-12-30 DIAGNOSIS — J309 Allergic rhinitis, unspecified: Secondary | ICD-10-CM | POA: Insufficient documentation

## 2012-12-30 HISTORY — DX: Allergic rhinitis, unspecified: J30.9

## 2013-01-04 HISTORY — PX: OTHER SURGICAL HISTORY: SHX169

## 2013-01-22 ENCOUNTER — Ambulatory Visit: Payer: Self-pay | Admitting: Pain Medicine

## 2013-05-14 ENCOUNTER — Ambulatory Visit: Payer: Self-pay | Admitting: Pain Medicine

## 2013-05-14 ENCOUNTER — Other Ambulatory Visit: Payer: Self-pay | Admitting: Pain Medicine

## 2013-05-14 LAB — BASIC METABOLIC PANEL
ANION GAP: 6 — AB (ref 7–16)
BUN: 26 mg/dL — AB (ref 7–18)
CALCIUM: 9.7 mg/dL (ref 8.5–10.1)
CREATININE: 1.54 mg/dL — AB (ref 0.60–1.30)
Chloride: 105 mmol/L (ref 98–107)
Co2: 28 mmol/L (ref 21–32)
EGFR (African American): 57 — ABNORMAL LOW
EGFR (Non-African Amer.): 49 — ABNORMAL LOW
Glucose: 256 mg/dL — ABNORMAL HIGH (ref 65–99)
Osmolality: 291 (ref 275–301)
Potassium: 3.9 mmol/L (ref 3.5–5.1)
Sodium: 139 mmol/L (ref 136–145)

## 2013-05-14 LAB — HEPATIC FUNCTION PANEL A (ARMC)
ALBUMIN: 4 g/dL (ref 3.4–5.0)
ALT: 52 U/L (ref 12–78)
AST: 42 U/L — AB (ref 15–37)
Alkaline Phosphatase: 127 U/L — ABNORMAL HIGH
Bilirubin,Total: 0.3 mg/dL (ref 0.2–1.0)
TOTAL PROTEIN: 7.4 g/dL (ref 6.4–8.2)

## 2013-05-14 LAB — SEDIMENTATION RATE: ERYTHROCYTE SED RATE: 7 mm/h (ref 0–20)

## 2013-05-14 LAB — MAGNESIUM: Magnesium: 1.5 mg/dL — ABNORMAL LOW

## 2013-08-13 DIAGNOSIS — I2510 Atherosclerotic heart disease of native coronary artery without angina pectoris: Secondary | ICD-10-CM | POA: Insufficient documentation

## 2013-08-13 DIAGNOSIS — E1169 Type 2 diabetes mellitus with other specified complication: Secondary | ICD-10-CM | POA: Insufficient documentation

## 2013-08-13 DIAGNOSIS — E785 Hyperlipidemia, unspecified: Secondary | ICD-10-CM

## 2013-08-18 ENCOUNTER — Ambulatory Visit: Payer: Self-pay | Admitting: Internal Medicine

## 2013-08-18 ENCOUNTER — Ambulatory Visit: Payer: Self-pay | Admitting: Pain Medicine

## 2013-08-18 LAB — COMPREHENSIVE METABOLIC PANEL
Albumin: 3.8 g/dL (ref 3.4–5.0)
Alkaline Phosphatase: 106 U/L
Anion Gap: 9 (ref 7–16)
BUN: 25 mg/dL — ABNORMAL HIGH (ref 7–18)
Bilirubin,Total: 0.3 mg/dL (ref 0.2–1.0)
Calcium, Total: 9.3 mg/dL (ref 8.5–10.1)
Chloride: 100 mmol/L (ref 98–107)
Co2: 27 mmol/L (ref 21–32)
Creatinine: 1.21 mg/dL (ref 0.60–1.30)
EGFR (African American): >60
EGFR (Non-African Amer.): >60
Glucose: 198 mg/dL — ABNORMAL HIGH (ref 65–99)
Osmolality: 282 (ref 275–301)
Potassium: 4 mmol/L (ref 3.5–5.1)
SGOT(AST): 48 U/L — ABNORMAL HIGH (ref 15–37)
SGPT (ALT): 70 U/L (ref 12–78)
Sodium: 136 mmol/L (ref 136–145)
Total Protein: 7.2 g/dL (ref 6.4–8.2)

## 2013-08-18 LAB — CK TOTAL AND CKMB (NOT AT ARMC)
CK, Total: 135 U/L
CK-MB: 1.7 ng/mL (ref 0.5–3.6)

## 2013-08-18 LAB — PROTIME-INR
INR: 0.9
PROTHROMBIN TIME: 11.7 s (ref 11.5–14.7)

## 2013-08-18 LAB — CBC
HCT: 46.4 % (ref 40.0–52.0)
HGB: 15.4 g/dL (ref 13.0–18.0)
MCH: 27.8 pg (ref 26.0–34.0)
MCHC: 33.1 g/dL (ref 32.0–36.0)
MCV: 84 fL (ref 80–100)
Platelet: 263 10*3/uL (ref 150–440)
RBC: 5.53 10*6/uL (ref 4.40–5.90)
RDW: 14.5 % (ref 11.5–14.5)
WBC: 8.6 10*3/uL (ref 3.8–10.6)

## 2013-08-18 LAB — TROPONIN I: Troponin-I: <0.02 ng/mL

## 2013-08-18 LAB — APTT: Activated PTT: 25.9 secs (ref 23.6–35.9)

## 2013-08-19 LAB — BASIC METABOLIC PANEL
ANION GAP: 10 (ref 7–16)
BUN: 21 mg/dL — AB (ref 7–18)
CALCIUM: 9.2 mg/dL (ref 8.5–10.1)
CO2: 28 mmol/L (ref 21–32)
Chloride: 101 mmol/L (ref 98–107)
Creatinine: 1.38 mg/dL — ABNORMAL HIGH (ref 0.60–1.30)
EGFR (Non-African Amer.): 56 — ABNORMAL LOW
GLUCOSE: 205 mg/dL — AB (ref 65–99)
Osmolality: 286 (ref 275–301)
Potassium: 3.8 mmol/L (ref 3.5–5.1)
Sodium: 139 mmol/L (ref 136–145)

## 2013-08-19 LAB — CK TOTAL AND CKMB (NOT AT ARMC)
CK, TOTAL: 84 U/L
CK-MB: 0.7 ng/mL (ref 0.5–3.6)

## 2013-12-01 ENCOUNTER — Ambulatory Visit: Payer: Self-pay | Admitting: Pain Medicine

## 2014-01-25 DIAGNOSIS — K219 Gastro-esophageal reflux disease without esophagitis: Secondary | ICD-10-CM

## 2014-02-23 ENCOUNTER — Observation Stay: Payer: Self-pay | Admitting: Internal Medicine

## 2014-02-23 LAB — CBC WITH DIFFERENTIAL/PLATELET
Basophil #: 0.1 10*3/uL (ref 0.0–0.1)
Basophil %: 1.3 %
EOS PCT: 2.5 %
Eosinophil #: 0.2 10*3/uL (ref 0.0–0.7)
HCT: 46 % (ref 40.0–52.0)
HGB: 15.4 g/dL (ref 13.0–18.0)
LYMPHS PCT: 36.5 %
Lymphocyte #: 2.6 10*3/uL (ref 1.0–3.6)
MCH: 28.3 pg (ref 26.0–34.0)
MCHC: 33.5 g/dL (ref 32.0–36.0)
MCV: 85 fL (ref 80–100)
MONO ABS: 0.6 x10 3/mm (ref 0.2–1.0)
MONOS PCT: 8.1 %
NEUTROS PCT: 51.6 %
Neutrophil #: 3.7 10*3/uL (ref 1.4–6.5)
PLATELETS: 234 10*3/uL (ref 150–440)
RBC: 5.43 10*6/uL (ref 4.40–5.90)
RDW: 14.8 % — ABNORMAL HIGH (ref 11.5–14.5)
WBC: 7.1 10*3/uL (ref 3.8–10.6)

## 2014-02-23 LAB — BASIC METABOLIC PANEL
ANION GAP: 7 (ref 7–16)
BUN: 15 mg/dL (ref 7–18)
CHLORIDE: 104 mmol/L (ref 98–107)
CREATININE: 1.26 mg/dL (ref 0.60–1.30)
Calcium, Total: 9.2 mg/dL (ref 8.5–10.1)
Co2: 29 mmol/L (ref 21–32)
EGFR (African American): >60
GLUCOSE: 250 mg/dL — AB (ref 65–99)
OSMOLALITY: 289 (ref 275–301)
POTASSIUM: 3.7 mmol/L (ref 3.5–5.1)
SODIUM: 140 mmol/L (ref 136–145)

## 2014-02-23 LAB — CK TOTAL AND CKMB (NOT AT ARMC)
CK, Total: 155 U/L (ref 39–308)
CK-MB: 3.3 ng/mL (ref 0.5–3.6)

## 2014-02-23 LAB — PROTIME-INR
INR: 1
Prothrombin Time: 12.9 secs (ref 11.5–14.7)

## 2014-02-23 LAB — D-DIMER(ARMC): D-DIMER: 174 ng/mL

## 2014-02-23 LAB — TROPONIN I: Troponin-I: <0.02 ng/mL

## 2014-02-24 LAB — BASIC METABOLIC PANEL
Anion Gap: 5 — ABNORMAL LOW (ref 7–16)
BUN: 12 mg/dL (ref 7–18)
Calcium, Total: 8.4 mg/dL — ABNORMAL LOW (ref 8.5–10.1)
Chloride: 106 mmol/L (ref 98–107)
Co2: 31 mmol/L (ref 21–32)
Creatinine: 1.24 mg/dL (ref 0.60–1.30)
GLUCOSE: 132 mg/dL — AB (ref 65–99)
OSMOLALITY: 285 (ref 275–301)
POTASSIUM: 3.9 mmol/L (ref 3.5–5.1)
SODIUM: 142 mmol/L (ref 136–145)

## 2014-06-12 NOTE — Consult Note (Signed)
General Aspect patient is a 59 year with a history of coronary artery disease status post PCI of the LAD with a drug-eluting stent in May of 2012 with restenosis of the LAD treated with balloon angioplasty in September of 2012.  Diabetes, hypertension hyperlipidemia as well as sleep apnea.  He presented to the emergency room with complaints of midsternal crushing chest pain which he states is identical to what he had previously.  Electrocardiogram cardiac markers are normal.  Patient states that his cardiac markers and electrocardiogram are always normal prior to his interventions.  he states he is is compliant with his CPAP.  He is on Effient as well as metoprolol, diltiazem.  He is on Zetia as well as Lipitor for his hyperlipidemia.  He is ruled out for myocardial infarction thus far.  He states since his pain is similar to his angina he is quite concerned about restenosis.  risk factors include hypertension, diabetes, hyperlipidemia and previous coronary artery disease.  He denies tobacco abuse.   Physical Exam:   GEN WN, NAD    HEENT PERRL    NECK supple    RESP normal resp effort  clear BS    CARD Regular rate and rhythm  No murmur    ABD denies tenderness  normal BS    LYMPH negative neck    EXTR negative cyanosis/clubbing, negative edema    SKIN normal to palpation    NEURO cranial nerves intact, motor/sensory function intact    PSYCH A+O to time, place, person   Review of Systems:   Subjective/Chief Complaint midsternal chest pain with radiation to his left arm    General: No Complaints    Skin: No Complaints    ENT: No Complaints    Eyes: No Complaints    Neck: No Complaints    Respiratory: No Complaints    Cardiovascular: Chest pain or discomfort    Gastrointestinal: No Complaints    Genitourinary: No Complaints    Vascular: No Complaints    Musculoskeletal: No Complaints    Neurologic: No Complaints    Hematologic: No Complaints    Endocrine: No  Complaints    Psychiatric: No Complaints    Review of Systems: All other systems were reviewed and found to be negative    Medications/Allergies Reviewed Medications/Allergies reviewed     Psychosis:    COPD:    Myocardial Infarct:    Hypercholesterolemia:    Hypertension:    Diabetes:    Menieres Disease:    Appendectomy:    Back Surgery: T 8-9 fusion of disc   Stent - Cardiac:    Shoulder Surgery - Right:   Home Medications:  Lipitor 80 mg oral tablet: 1 tab(s) orally once a day (at bedtime), Active  Nexium 40 mg oral delayed release capsule: 1 cap(s) orally 2 times a day, Active  metformin 500 mg oral tablet: 2 tab(s) orally 2 times a day, Active  gabapentin 800 mg oral tablet: 2 tab(s) orally 2 times a day, Active  Celebrex 200 mg oral capsule: 1 cap(s) orally 2 times a day, Active  Fish Oil: 1200 milligram(s) orally 2 times a day, Active  aspirin 81 mg oral tablet: 1 tab(s) orally once a day, Active  Niaspan ER 500 mg oral tablet, extended release: 1 tab(s) orally once a day (at bedtime), Active  meclizine 25 mg oral tablet: 1 tab(s) orally 3 times a day, Active  diazepam 5 mg oral tablet: tab(s) orally 3 times a day, Active  Zyrtec 10 mg oral tablet: 1 tab(s) orally once a day, Active  Victoza 18 mg/3 mL subcutaneous solution:  subcutaneous , Active  Humalog Mix 75/25 subcutaneous suspension: 50 units morning, 60 units PM, Active  Lantus 100 units/mL subcutaneous solution: 60  subcutaneous once a day, Active  glimepiride 4 mg oral tablet: 1 tab(s) orally once a day, Active  metoprolol succinate 50 mg oral tablet, extended release: 1 tab(s) orally once a day, Active  Effient 10 mg oral tablet: 1 tab(s) orally once a day, Active  Cardizem LA 240 mg/24 hours oral tablet, extended release: 1 tab(s) orally once a day, Active  Zetia 10 mg oral tablet: 1 tab(s) orally once a day, Active  oxycodone 5 mg oral tablet: 1 tab(s) orally 4 times a day, As Needed,  Active  enalapril 5 mg oral tablet: 1 tab(s) orally once a day, Active  Flexeril: 10 milligram(s) orally once a day, Active  hydrochlorothiazide-triamterene 50 mg-75 mg oral tablet: 1 tab(s) orally once a day, Active  EKG:   EKG NSR    No Known Allergies:     Impression 59 year old male with history of diabetes, hypertension, hyperlipidemia and history of coronary artery disease status post PCI of the LAD in May of 2012 followed by restenosis and treatment of the LAD lesion with balloon angioplasty in September of 2012.  Is with recurrent chest pain which he states is identical to his angina.  He is ruled out for myocardial infarction.  Electrocardiogram is normal.  Pain is improved with topical nitrates and continuation of his medications.  He states he has been compliant with Effient in addition to his other medications including CPAP for his sleep apnea.  Given his rest pain with symptoms similar to his coronary artery disease, we'll need to assess his stent patency with a cardiac catheterization.  Risk and benefits of cardiac catheterization including bleeding requiring transfusion, stroke, myocardial infarction were explained to the patient.    Plan 1.  Continue with current medications with the exception of holding metformin this a.m. secondary to pending cardiac catheterization 2.  Careful hydration given CK D3. 3.  Low-fat, low-cholesterol, low-sodium diet 4. proceed with left cardiac catheterization to evaluate stent patency in a patient with coronary artery disease and symptoms at rest similar to his angina. 5.  further recommendations after cardiac catheterization   Electronic Signatures: Judd, Mccubbin (MD)  (Signed 07-Jan-13 07:58)  Authored: General Aspect/Present Illness, History and Physical Exam, Review of System, Past Medical History, Home Medications, EKG , Allergies, Impression/Plan   Last Updated: 07-Jan-13 07:58 by Teodoro Spray (MD)

## 2014-06-12 NOTE — H&P (Signed)
PATIENT NAME:  Kenneth Osborn, Kenneth Osborn MR#:  [id number] DATE OF BIRTH:  [birth year]  DATE OF ADMISSION:  02/24/2011  PRIMARY CARE PHYSICIAN: Lisette Grinder, III, MD   CARDIOLOGIST: Serafina Royals, MD   REFERRING PHYSICIAN: Emergency Room physician, Dr. Thomasene Lot    CHIEF COMPLAINT: Chest pain.   HISTORY OF PRESENT ILLNESS: The patient is a 59 year old male with history of known coronary artery disease. The patient had stent to his LAD in April of 2012 and presented in October with chest pain at which time he had another cardiac catheterization which showed that the patient had in-stent stenosis within the LAD. He underwent balloon angioplasty of 99% LAD lesion. The patient was actually doing well until a couple of weeks ago when he started having chest tightness described as pressure with exertion. In addition to developing dyspnea on exertion, the patient reports that over time he has been getting short-winded with minimal exertion. In the last two days, the patient has been getting chest pressure with rest and has been taking several doses of nitroglycerin. He also was diaphoretic today. Therefore, he called Dr. Ubaldo Glassing who recommended that he come to the Emergency Room. Currently the patient is feeling better but continues to have some chest tightness.  PAST MEDICAL HISTORY: 1. Diabetes.  2. Hypertension.  3. Hyperlipidemia.  4. Coronary artery disease. 5. SVT/sinus  tachycardia. 6. Diabetic neuropathy. 7. Microalbuminuria. 8. Obesity. 9. Meniere's disease status post right labyrinthectomy. 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 10. Gastroesophageal reflux disease.  11. History of Barrett's esophagus. 12. History of H. pylori in the past. 13. MR.  14. Sleep apnea, on CPAP. 15. History of degenerative joint disease. 16. Chronic neck and back pain.  PAST SURGICAL HISTORY: 1. Surgery on both ears for Meniere's disease. 2. Discectomy T9 in 2009. 3. Repair of frozen right  shoulder.  4. Appendectomy.  5. Epidural steroid injections.  CURRENT MEDICATIONS:  1. Lipitor 80 mg daily.  2. Nexium 40 mg b.i.d.  3. Metformin 1000 mg b.i.d.  4. Neurontin 800 mg b.i.d.  5. Celebrex 200 mg b.i.d.  6. Flexeril 10 mg t.i.d.  7. Fish Oil 1200 mg b.i.d.  8. Baby aspirin 81 mg daily.  9. Niaspan 500 mg daily.  10. Oxycodone 5 mg p.r.n.  11. Meclizine 25 mg t.i.d.  12. Diazepam 5 mg q.8 hours p.r.n.  13. Triamterene/HCTZ 75/100 1 tablet daily.  14. Cetirizine 10 mg daily.  15. Victoza 18 mg sub-Q daily.  16. Humalog 75/25 50 units in a.m., 60 units in p.m.  17. Lantus 60 units daily.  18. Glimepiride 4 mg daily.  19. Toprol-XL 50 mg daily.  20. Enalapril 2.5 mg daily.  21. Effient 10 mg daily.  22. Cardizem 240 mg daily. 23. Zetia 10 mg daily.   SOCIAL HISTORY: The patient is on disability since 1999. Denies any history of smoking, alcohol or drug abuse.   FAMILY HISTORY: The patient has extensive history of coronary artery disease and congestive heart failure in both his parents. Family history is also positive for hypertension and diabetes.   REVIEW OF SYSTEMS: CONSTITUTIONAL: The patient reports weakness and fatigue. Denies any fever or recent weight changes. EYES: Wears glasses. Denies any vision changes. No glaucoma. ENT: Denies any tinnitus or ear pain. 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 CARDIOVASCULAR: Is report chest pain, tachycardia, palpitations. RESPIRATORY: Denies any painful respiration, wheezing, or cough. GI: Denies any nausea, vomiting, diarrhea,  or abdominal pain. GU: Denies any nocturia or pyuria. MUSCULOSKELETAL: Denies any joint pain, stiffness, or arthritis. INTEGUMENTARY: Denies any rashes or eruptions. NEUROLOGICAL: Denies any fainting, blackouts, or seizures. PSYCH: Denies any personality disorder. ENDOCRINE: Denies any thyroid problems, heat or cold intolerance. HEME/LYMPH: Denies any anemia or easy  bruisability.   PHYSICAL EXAMINATION:   VITAL SIGNS: Temperature 94.9, heart rate 98, respiratory rate 20, blood pressure 156/83, pulse oximetry 96% on room air.   GENERAL: The patient is a middle-aged Caucasian male sitting comfortably in bed not in acute distress.   HEAD: Atraumatic, normocephalic.   EYES: No pallor, icterus, or cyanosis. Pupils equal, round, and reactive to light and accommodation. Extraocular movements intact.   ENT: Wet mucous membranes. No oropharyngeal erythema or thrush.   NECK: Supple. No masses. No JVD. No thyromegaly or lymphadenopathy.   CHEST WALL: Not using accessory muscles of respiration. No intercostal muscle retractions.   LUNGS: Bilaterally clear to auscultation. No wheezing, rales, or rhonchi.   CARDIOVASCULAR: S1, S2 regular. No murmur, rubs, or gallops.   ABDOMEN: Soft, nontender, nondistended. No guarding. No rigidity. No organomegaly.   MUSCULOSKELETAL: No cyanosis or clubbing.   SKIN: No rashes or lesions.   PERIPHERIES: No pedal edema. 2+ pedal pulses.   NEUROLOGICAL: Awake, alert, oriented x3. Nonfocal neurological exam. Cranial nerves grossly intact.   PSYCH: Normal mood and affect.   LABORATORY, DIAGNOSTIC, AND RADIOLOGICAL DATA: CBC normal. Cardiac enzymes negative. Glucose 206, BUN 26, creatinine 1.61. Normal electrolytes other than magnesium of 1.3.   ASSESSMENT AND PLAN: This is a 59 year old male with history of coronary artery disease, diabetes, and hypertension who presents with anginal symptoms.  1. Unstable angina. The patient will be admitted to the hospital. Will check serial cardiac enzymes. Will continue the patient's aspirin, Effient, beta-blocker, ACE inhibitor, and statin. Will place on nitro patch and also nitroglycerin p.r.n. He will be started on a heparin drip. Will obtain a Cardiology consultation. Will hold the patient's metformin in case he needs a cardiac catheterization. 2. 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 Will monitor closely. Avoid any nephrotoxic medications. Will monitor strict I's and O's and monitor his creatinine.  3. Hypomagnesemia. Will replace his low magnesium. His potassium is normal.  4. Diabetes, well controlled at present. Will continue the patient's current regimen.  5. Hyperlipidemia. Will continue the patient's statin therapy.  6. Hypertension, appears to be well controlled at present. Will continue the patient's current treatment regimen.  7. Gastroesophageal reflux disease. Will continue the patient's PPI.  8. History of sleep apnea. Will provide oxygen supplementation and CPAP at night.   Reviewed old medical records, discussed with the patient and his wife at bedside the plan of care and management.   TIME SPENT: 75 minutes.  ____________________________ Cherre Huger, MD sp:drc D: 02/24/2011 20:47:52 ET T: 02/25/2011 06:35:46 ET JOB#: [id number]  cc: Cherre Huger, MD, <Dictator> John B. Sarina Ser, MD Cherre Huger MD ELECTRONICALLY SIGNED 02/25/2011 12:15

## 2014-06-20 DIAGNOSIS — E1142 Type 2 diabetes mellitus with diabetic polyneuropathy: Secondary | ICD-10-CM

## 2014-08-12 DIAGNOSIS — R1310 Dysphagia, unspecified: Secondary | ICD-10-CM | POA: Insufficient documentation

## 2014-08-12 DIAGNOSIS — IMO0001 Reserved for inherently not codable concepts without codable children: Secondary | ICD-10-CM

## 2014-08-12 DIAGNOSIS — Z860101 Personal history of adenomatous and serrated colon polyps: Secondary | ICD-10-CM | POA: Insufficient documentation

## 2014-08-12 DIAGNOSIS — K2270 Barrett's esophagus without dysplasia: Secondary | ICD-10-CM | POA: Insufficient documentation

## 2014-08-12 DIAGNOSIS — Z86010 Personal history of colonic polyps: Secondary | ICD-10-CM | POA: Insufficient documentation

## 2014-08-12 HISTORY — DX: Reserved for inherently not codable concepts without codable children: IMO0001

## 2014-09-14 DIAGNOSIS — I10 Essential (primary) hypertension: Secondary | ICD-10-CM | POA: Insufficient documentation

## 2014-09-14 DIAGNOSIS — E1159 Type 2 diabetes mellitus with other circulatory complications: Secondary | ICD-10-CM | POA: Insufficient documentation

## 2014-09-16 ENCOUNTER — Encounter: Payer: Self-pay | Admitting: *Deleted

## 2014-09-19 ENCOUNTER — Encounter
Admission: RE | Disposition: A | Discharge status: Home / Self Care | Payer: Self-pay | Source: Ambulatory Visit | Attending: Unknown Physician Specialty

## 2014-09-19 ENCOUNTER — Ambulatory Visit: Payer: Managed Care, Other (non HMO) | Admitting: Anesthesiology

## 2014-09-19 ENCOUNTER — Encounter: Payer: Self-pay | Admitting: *Deleted

## 2014-09-19 ENCOUNTER — Ambulatory Visit
Admission: RE | Admit: 2014-09-19 | Discharge: 2014-09-19 | Disposition: A | Discharge status: Home / Self Care | Payer: Managed Care, Other (non HMO) | Source: Ambulatory Visit | Attending: Unknown Physician Specialty | Admitting: Unknown Physician Specialty

## 2014-09-19 DIAGNOSIS — I10 Essential (primary) hypertension: Secondary | ICD-10-CM | POA: Diagnosis not present

## 2014-09-19 DIAGNOSIS — R1310 Dysphagia, unspecified: Secondary | ICD-10-CM | POA: Diagnosis not present

## 2014-09-19 DIAGNOSIS — I252 Old myocardial infarction: Secondary | ICD-10-CM | POA: Diagnosis not present

## 2014-09-19 DIAGNOSIS — K2270 Barrett's esophagus without dysplasia: Secondary | ICD-10-CM | POA: Diagnosis present

## 2014-09-19 DIAGNOSIS — I2510 Atherosclerotic heart disease of native coronary artery without angina pectoris: Secondary | ICD-10-CM | POA: Diagnosis not present

## 2014-09-19 DIAGNOSIS — K2970 Gastritis, unspecified, without bleeding: Secondary | ICD-10-CM | POA: Diagnosis not present

## 2014-09-19 DIAGNOSIS — K640 First degree hemorrhoids: Secondary | ICD-10-CM | POA: Diagnosis not present

## 2014-09-19 DIAGNOSIS — M5030 Other cervical disc degeneration, unspecified cervical region: Secondary | ICD-10-CM | POA: Insufficient documentation

## 2014-09-19 DIAGNOSIS — H9191 Unspecified hearing loss, right ear: Secondary | ICD-10-CM | POA: Diagnosis not present

## 2014-09-19 DIAGNOSIS — M549 Dorsalgia, unspecified: Secondary | ICD-10-CM | POA: Insufficient documentation

## 2014-09-19 DIAGNOSIS — Z86010 Personal history of colonic polyps: Secondary | ICD-10-CM | POA: Diagnosis not present

## 2014-09-19 DIAGNOSIS — E1140 Type 2 diabetes mellitus with diabetic neuropathy, unspecified: Secondary | ICD-10-CM | POA: Diagnosis not present

## 2014-09-19 DIAGNOSIS — G8929 Other chronic pain: Secondary | ICD-10-CM | POA: Diagnosis not present

## 2014-09-19 DIAGNOSIS — Z794 Long term (current) use of insulin: Secondary | ICD-10-CM | POA: Diagnosis not present

## 2014-09-19 DIAGNOSIS — K219 Gastro-esophageal reflux disease without esophagitis: Secondary | ICD-10-CM | POA: Diagnosis not present

## 2014-09-19 DIAGNOSIS — G4730 Sleep apnea, unspecified: Secondary | ICD-10-CM | POA: Diagnosis not present

## 2014-09-19 DIAGNOSIS — D122 Benign neoplasm of ascending colon: Secondary | ICD-10-CM | POA: Diagnosis not present

## 2014-09-19 DIAGNOSIS — Z955 Presence of coronary angioplasty implant and graft: Secondary | ICD-10-CM | POA: Diagnosis not present

## 2014-09-19 DIAGNOSIS — G709 Myoneural disorder, unspecified: Secondary | ICD-10-CM | POA: Insufficient documentation

## 2014-09-19 DIAGNOSIS — D120 Benign neoplasm of cecum: Secondary | ICD-10-CM | POA: Diagnosis not present

## 2014-09-19 DIAGNOSIS — E785 Hyperlipidemia, unspecified: Secondary | ICD-10-CM | POA: Insufficient documentation

## 2014-09-19 DIAGNOSIS — H8101 Meniere's disease, right ear: Secondary | ICD-10-CM | POA: Diagnosis not present

## 2014-09-19 DIAGNOSIS — Z7982 Long term (current) use of aspirin: Secondary | ICD-10-CM | POA: Diagnosis not present

## 2014-09-19 HISTORY — DX: Barrett's esophagus without dysplasia: K22.70

## 2014-09-19 HISTORY — DX: Dysphagia, unspecified: R13.10

## 2014-09-19 HISTORY — PX: SAVORY DILATION: SHX5439

## 2014-09-19 HISTORY — DX: Other cervical disc degeneration, unspecified cervical region: M50.30

## 2014-09-19 HISTORY — PX: ESOPHAGOGASTRODUODENOSCOPY: SHX5428

## 2014-09-19 HISTORY — DX: Hyperlipidemia, unspecified: E78.5

## 2014-09-19 HISTORY — PX: COLONOSCOPY WITH PROPOFOL: SHX5780

## 2014-09-19 LAB — GLUCOSE, CAPILLARY: GLUCOSE-CAPILLARY: 224 mg/dL — AB (ref 65–99)

## 2014-09-19 SURGERY — COLONOSCOPY WITH PROPOFOL
Anesthesia: General

## 2014-09-19 MED ORDER — FENTANYL CITRATE (PF) 100 MCG/2ML IJ SOLN
INTRAMUSCULAR | Status: DC | PRN
  Administered 2014-09-19: 50 ug via INTRAVENOUS

## 2014-09-19 MED ORDER — PROPOFOL INFUSION 10 MG/ML OPTIME
INTRAVENOUS | Status: DC | PRN
  Administered 2014-09-19: 140 ug/kg/min via INTRAVENOUS

## 2014-09-19 MED ORDER — PHENYLEPHRINE HCL 10 MG/ML IJ SOLN
INTRAMUSCULAR | Status: DC | PRN
  Administered 2014-09-19: 50 ug via INTRAVENOUS

## 2014-09-19 MED ORDER — SODIUM CHLORIDE 0.9 % IV SOLN: INTRAVENOUS | Status: DC

## 2014-09-19 MED ORDER — SODIUM CHLORIDE 0.9 % IV SOLN
INTRAVENOUS | Status: DC
  Administered 2014-09-19: 08:00:00 via INTRAVENOUS

## 2014-09-19 MED ORDER — GLYCOPYRROLATE 0.2 MG/ML IJ SOLN
INTRAMUSCULAR | Status: DC | PRN
  Administered 2014-09-19: 0.1 mg via INTRAVENOUS

## 2014-09-19 MED ORDER — MIDAZOLAM HCL 2 MG/2ML IJ SOLN
INTRAMUSCULAR | Status: DC | PRN
  Administered 2014-09-19: 1 mg via INTRAVENOUS

## 2014-09-19 MED ORDER — LIDOCAINE HCL (CARDIAC) 20 MG/ML IV SOLN
INTRAVENOUS | Status: DC | PRN
  Administered 2014-09-19: 60 mg via INTRAVENOUS

## 2014-09-19 NOTE — H&P (Signed)
Primary Care Physician:  Madelyn Brunner, MD Primary Gastroenterologist:  Dr. Vira Agar  Pre-Procedure History & Physical: HPI:  Kenneth Osborn is a 59 y.o. male is here for an endoscopy and colonoscopy.   Past Medical History  Diagnosis Date  . Myocardial infarction     Sees Dr. Drema Dallas, [place] [facility]  . Coronary artery disease     99% blockage  . Hypertension     sees Dr. Jenny Reichmann walker Jefm Bryant  . 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 Bronchitis     hx of  . Diabetes mellitus without complication     insulin dependent  . GERD (gastroesophageal reflux disease)   . Neuromuscular disorder     diabetic neuropathy in feet  . Chronic back pain     thoracic area  . Sleep apnea     uses cpap and 2 L o2 at hs  . Hyperlipidemia   . DDD (degenerative disc disease), cervical   . Short-segment Barrett's esophagus   . Dysphagia     Past Surgical History  Procedure Laterality Date  . Cardiovascular stress test      jan 2014  . Cardiac catheterization      may 2012 and Nov 20, 2010  . Coronary angioplasty      stent placement  . Labrinthectomy      1999 right ear  . Mastoid shunt      left, 2002, 1997 right ear, 1980 right ear  . Appendectomy    . Back surgery      fusion thoracic area  . Shoulder arthroscopy with subacromial decompression Left 04/06/2012    Procedure: SHOULDER ARTHROSCOPY WITH SUBACROMIAL DECOMPRESSION;  Surgeon: Vickey Huger, MD;  Location: [facility];  Service: Orthopedics;  Laterality: Left;  left shoulder arthroscopy, subacromial decompression and distal clavicle resection  . Arthrodesis anterior anterior cervicle spine  01/04/2013    Prior to Admission medications   Medication Sig Start Date End Date Taking? Authorizing Provider  aspirin EC 81 MG tablet Take 81 mg by mouth daily.   Yes Historical Provider, MD  celecoxib (CELEBREX) 200 MG capsule Take 200 mg by mouth 3 (three) times daily.   Yes Historical Provider, MD  cetirizine  (ZYRTEC) 10 MG tablet Take 10 mg by mouth daily.   Yes Historical Provider, MD  cyclobenzaprine (FLEXERIL) 10 MG tablet Take 10 mg by mouth 3 (three) times daily.   Yes Historical Provider, MD  diazepam (VALIUM) 5 MG tablet Take 5 mg by mouth 3 (three) times daily.   Yes Historical Provider, MD  diltiazem (DILACOR XR) 240 MG 24 hr capsule Take 240 mg by mouth daily.   Yes Historical Provider, MD  enalapril (VASOTEC) 5 MG tablet Take 5 mg by mouth daily.   Yes Historical Provider, MD  esomeprazole (NEXIUM) 40 MG capsule Take 40 mg by mouth 2 (two) times daily.   Yes Historical Provider, MD  gabapentin (NEURONTIN) 800 MG tablet Take 1,600 mg by mouth 2 (two) times daily.   Yes Historical Provider, MD  meclizine (ANTIVERT) 25 MG tablet Take 25 mg by mouth 3 (three) times daily.   Yes Historical Provider, MD  metoprolol succinate (TOPROL-XL) 50 MG 24 hr tablet Take 100 mg by mouth daily. Take with or immediately following a meal.   Yes Historical Provider, MD  oxyCODONE (OXY IR/ROXICODONE) 5 MG immediate release tablet Take 5-10 mg by mouth every 4 (four) hours as needed for pain (for  pain).   Yes Historical Provider, MD  prasugrel (EFFIENT) 10 MG TABS Take 10 mg by mouth daily.   Yes Historical Provider, MD  triamterene-hydrochlorothiazide (MAXZIDE) 75-50 MG per tablet Take 1 tablet by mouth daily.   Yes Historical Provider, MD  atorvastatin (LIPITOR) 80 MG tablet Take 80 mg by mouth daily.    Historical Provider, MD  ezetimibe (ZETIA) 10 MG tablet Take 10 mg by mouth daily.    Historical Provider, MD  glimepiride (AMARYL) 4 MG tablet Take 4 mg by mouth every morning.    Historical Provider, MD  insulin glargine (LANTUS) 100 UNIT/ML injection Inject 80 Units into the skin at bedtime.    Historical Provider, MD  insulin lispro protamine-insulin lispro (HUMALOG 75/25) (75-25) 100 UNIT/ML SUSP Inject 50-60 Units into the skin 2 (two) times daily with a meal. 50 units every morning and 60 units every  evening    Historical Provider, MD  isosorbide mononitrate (IMDUR) 30 MG 24 hr tablet Take 30 mg by mouth daily.    Historical Provider, MD  magnesium oxide (MAG-OX) 400 MG tablet Take 400 mg by mouth daily.    Historical Provider, MD  metFORMIN (GLUCOPHAGE) 1000 MG tablet Take 1,000 mg by mouth 2 (two) times daily with a meal.    Historical Provider, MD  niacin (NIASPAN) 500 MG CR tablet Take 500 mg by mouth at bedtime.    Historical Provider, MD  nitroGLYCERIN (NITROSTAT) 0.4 MG SL tablet Place 0.4 mg under the tongue every 5 (five) minutes as needed for chest pain (chest pain).    Historical Provider, MD  Omega-3 Fatty Acids (FISH OIL) 1200 MG CAPS Take 1,200 mg by mouth 2 (two) times daily.    Historical Provider, MD    Allergies as of 08/17/2014  . (No Known Allergies)    History reviewed. No pertinent family history.  History   Social History  . Marital Status: Married    Spouse Name: N/A  . Number of Children: N/A  . Years of Education: N/A   Occupational History  . Not on file.   Social History Main Topics  . Smoking status: Never Smoker   . Smokeless tobacco: Never Used  . Alcohol Use: No  . Drug Use: No  . Sexual Activity: Not on file   Other Topics Concern  . Not on file   Social History Narrative    Review of Systems: See HPI, otherwise negative ROS  Physical Exam: BP 111/84 mmHg  Pulse 116  Temp(Src) 97.4 F (36.3 C) (Tympanic)  Resp 20  Ht 5' 9"  (1.753 m)  Wt 102.059 kg (225 lb)  BMI 33.21 kg/m2  SpO2 96% General:   Alert,  pleasant and cooperative in NAD Head:  Normocephalic and atraumatic. Neck:  Supple; no masses or thyromegaly. Lungs:  Clear throughout to auscultation.    Heart:  Regular rate and rhythm. Abdomen:  Soft, nontender and nondistended. Normal bowel sounds, without guarding, and without rebound.   Neurologic:  Alert and  oriented x4;  grossly normal neurologically.  Impression/Plan: Kenneth Osborn is here for an endoscopy and  colonoscopy to be performed for Barretts esophagus and PH of colon polyps  Risks, benefits, limitations, and alternatives regarding  endoscopy and colonoscopy have been reviewed with the patient.  Questions have been answered.  All parties agreeable.   Gaylyn Cheers, MD  09/19/2014, 8:06 AM   Primary Care Physician:  Madelyn Brunner, MD Primary Gastroenterologist:  Dr. Vira Agar  Pre-Procedure History &  Physical: HPI:  Kenneth Osborn is a 59 y.o. male is here for an endoscopy and colonoscopy.   Past Medical History  Diagnosis Date  . Myocardial infarction     Sees Dr. Drema Dallas, [place] [facility]  . Coronary artery disease     99% blockage  . Hypertension     sees Dr. Jenny Reichmann walker Jefm Bryant  . 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 Bronchitis     hx of  . Diabetes mellitus without complication     insulin dependent  . GERD (gastroesophageal reflux disease)   . Neuromuscular disorder     diabetic neuropathy in feet  . Chronic back pain     thoracic area  . Sleep apnea     uses cpap and 2 L o2 at hs  . Hyperlipidemia   . DDD (degenerative disc disease), cervical   . Short-segment Barrett's esophagus   . Dysphagia     Past Surgical History  Procedure Laterality Date  . Cardiovascular stress test      jan 2014  . Cardiac catheterization      may 2012 and Nov 20, 2010  . Coronary angioplasty      stent placement  . Labrinthectomy      1999 right ear  . Mastoid shunt      left, 2002, 1997 right ear, 1980 right ear  . Appendectomy    . Back surgery      fusion thoracic area  . Shoulder arthroscopy with subacromial decompression Left 04/06/2012    Procedure: SHOULDER ARTHROSCOPY WITH SUBACROMIAL DECOMPRESSION;  Surgeon: Vickey Huger, MD;  Location: [facility];  Service: Orthopedics;  Laterality: Left;  left shoulder arthroscopy, subacromial decompression and distal clavicle resection  . Arthrodesis anterior anterior cervicle spine  01/04/2013    Prior to  Admission medications   Medication Sig Start Date End Date Taking? Authorizing Provider  aspirin EC 81 MG tablet Take 81 mg by mouth daily.   Yes Historical Provider, MD  celecoxib (CELEBREX) 200 MG capsule Take 200 mg by mouth 3 (three) times daily.   Yes Historical Provider, MD  cetirizine (ZYRTEC) 10 MG tablet Take 10 mg by mouth daily.   Yes Historical Provider, MD  cyclobenzaprine (FLEXERIL) 10 MG tablet Take 10 mg by mouth 3 (three) times daily.   Yes Historical Provider, MD  diazepam (VALIUM) 5 MG tablet Take 5 mg by mouth 3 (three) times daily.   Yes Historical Provider, MD  diltiazem (DILACOR XR) 240 MG 24 hr capsule Take 240 mg by mouth daily.   Yes Historical Provider, MD  enalapril (VASOTEC) 5 MG tablet Take 5 mg by mouth daily.   Yes Historical Provider, MD  esomeprazole (NEXIUM) 40 MG capsule Take 40 mg by mouth 2 (two) times daily.   Yes Historical Provider, MD  gabapentin (NEURONTIN) 800 MG tablet Take 1,600 mg by mouth 2 (two) times daily.   Yes Historical Provider, MD  meclizine (ANTIVERT) 25 MG tablet Take 25 mg by mouth 3 (three) times daily.   Yes Historical Provider, MD  metoprolol succinate (TOPROL-XL) 50 MG 24 hr tablet Take 100 mg by mouth daily. Take with or immediately following a meal.   Yes Historical Provider, MD  oxyCODONE (OXY IR/ROXICODONE) 5 MG immediate release tablet Take 5-10 mg by mouth every 4 (four) hours as needed for pain (for pain).   Yes Historical Provider, MD  prasugrel (EFFIENT) 10 MG TABS Take 10 mg by mouth daily.  Yes Historical Provider, MD  triamterene-hydrochlorothiazide (MAXZIDE) 75-50 MG per tablet Take 1 tablet by mouth daily.   Yes Historical Provider, MD  atorvastatin (LIPITOR) 80 MG tablet Take 80 mg by mouth daily.    Historical Provider, MD  ezetimibe (ZETIA) 10 MG tablet Take 10 mg by mouth daily.    Historical Provider, MD  glimepiride (AMARYL) 4 MG tablet Take 4 mg by mouth every morning.    Historical Provider, MD  insulin glargine  (LANTUS) 100 UNIT/ML injection Inject 80 Units into the skin at bedtime.    Historical Provider, MD  insulin lispro protamine-insulin lispro (HUMALOG 75/25) (75-25) 100 UNIT/ML SUSP Inject 50-60 Units into the skin 2 (two) times daily with a meal. 50 units every morning and 60 units every evening    Historical Provider, MD  isosorbide mononitrate (IMDUR) 30 MG 24 hr tablet Take 30 mg by mouth daily.    Historical Provider, MD  magnesium oxide (MAG-OX) 400 MG tablet Take 400 mg by mouth daily.    Historical Provider, MD  metFORMIN (GLUCOPHAGE) 1000 MG tablet Take 1,000 mg by mouth 2 (two) times daily with a meal.    Historical Provider, MD  niacin (NIASPAN) 500 MG CR tablet Take 500 mg by mouth at bedtime.    Historical Provider, MD  nitroGLYCERIN (NITROSTAT) 0.4 MG SL tablet Place 0.4 mg under the tongue every 5 (five) minutes as needed for chest pain (chest pain).    Historical Provider, MD  Omega-3 Fatty Acids (FISH OIL) 1200 MG CAPS Take 1,200 mg by mouth 2 (two) times daily.    Historical Provider, MD    Allergies as of 08/17/2014  . (No Known Allergies)    History reviewed. No pertinent family history.  History   Social History  . Marital Status: Married    Spouse Name: N/A  . Number of Children: N/A  . Years of Education: N/A   Occupational History  . Not on file.   Social History Main Topics  . Smoking status: Never Smoker   . Smokeless tobacco: Never Used  . Alcohol Use: No  . Drug Use: No  . Sexual Activity: Not on file   Other Topics Concern  . Not on file   Social History Narrative    Review of Systems: See HPI, otherwise negative ROS  Physical Exam: BP 111/84 mmHg  Pulse 116  Temp(Src) 97.4 F (36.3 C) (Tympanic)  Resp 20  Ht 5' 9"  (1.753 m)  Wt 102.059 kg (225 lb)  BMI 33.21 kg/m2  SpO2 96% General:   Alert,  pleasant and cooperative in NAD Head:  Normocephalic and atraumatic. Neck:  Supple; no masses or thyromegaly. Lungs:  Clear throughout to  auscultation.    Heart:  Regular rate and rhythm. Abdomen:  Soft, nontender and nondistended. Normal bowel sounds, without guarding, and without rebound.   Neurologic:  Alert and  oriented x4;  grossly normal neurologically.  Impression/Plan: Kenneth Osborn is here for an endoscopy and colonoscopy to be performed for Barretts esophagus and PH colon polyps  Risks, benefits, limitations, and alternatives regarding  endoscopy and colonoscopy have been reviewed with the patient.  Questions have been answered.  All parties agreeable.   Gaylyn Cheers, MD  09/19/2014, 8:06 AM

## 2014-09-19 NOTE — Anesthesia Preprocedure Evaluation (Signed)
Anesthesia Evaluation  Patient identified by MRN, date of birth, ID band Patient awake    Reviewed: Allergy & Precautions, NPO status , Patient's Chart, lab work & pertinent test results  History of Anesthesia Complications Negative for: history of anesthetic complications  Airway Mallampati: II  TM Distance: >3 FB Neck ROM: Full    Dental  (+) Teeth Intact   Pulmonary sleep apnea (CPAP occ.), Continuous Positive Airway Pressure Ventilation and Oxygen sleep apnea ,          Cardiovascular hypertension, Pt. on medications and Pt. on home beta blockers + CAD, + Past MI and + Cardiac Stents     Neuro/Psych  Neuromuscular disease (C4-5 fusion, T8-9 fusion)    GI/Hepatic GERD-  Medicated and Controlled,  Endo/Other  diabetes, Type 2, Oral Hypoglycemic Agents, Insulin Dependent  Renal/GU      Musculoskeletal  (+) Arthritis -, Osteoarthritis,    Abdominal   Peds  Hematology   Anesthesia Other Findings   Reproductive/Obstetrics                             Anesthesia Physical Anesthesia Plan  ASA: III  Anesthesia Plan: General   Post-op Pain Management:    Induction: Intravenous  Airway Management Planned: Nasal Cannula  Additional Equipment:   Intra-op Plan:   Post-operative Plan:   Informed Consent: I have reviewed the patients History and Physical, chart, labs and discussed the procedure including the risks, benefits and alternatives for the proposed anesthesia with the patient or authorized representative who has indicated his/her understanding and acceptance.     Plan Discussed with:   Anesthesia Plan Comments:         Anesthesia Quick Evaluation

## 2014-09-19 NOTE — Transfer of Care (Signed)
Immediate Anesthesia Transfer of Care Note  Patient: Kenneth Osborn  Procedure(s) Performed: Procedure(s): COLONOSCOPY WITH PROPOFOL (N/A) ESOPHAGOGASTRODUODENOSCOPY (EGD) (N/A) SAVORY DILATION (N/A)  Patient Location: PACU  Anesthesia Type:General  Level of Consciousness: awake, alert  and sedated  Airway & Oxygen Therapy: Patient Spontanous Breathing and Patient connected to nasal cannula oxygen  Post-op Assessment: Report given to RN and Post -op Vital signs reviewed and stable  Post vital signs: Reviewed and stable  Last Vitals:  Filed Vitals:   09/19/14 0852  BP:   Pulse: 102  Temp: 36.5 C  Resp: 18    Complications: No apparent anesthesia complications

## 2014-09-19 NOTE — Anesthesia Postprocedure Evaluation (Signed)
  Anesthesia Post-op Note  Patient: Kenneth Osborn  Procedure(s) Performed: Procedure(s): COLONOSCOPY WITH PROPOFOL (N/A) ESOPHAGOGASTRODUODENOSCOPY (EGD) (N/A) SAVORY DILATION (N/A)  Anesthesia type:General  Patient location: PACU  Post pain: Pain level controlled  Post assessment: Post-op Vital signs reviewed, Patient's Cardiovascular Status Stable, Respiratory Function Stable, Patent Airway and No signs of Nausea or vomiting  Post vital signs: Reviewed and stable  Last Vitals:  Filed Vitals:   09/19/14 0919  BP: 111/79  Pulse: 96  Temp:   Resp: 20    Level of consciousness: awake, alert  and patient cooperative  Complications: No apparent anesthesia complications

## 2014-09-19 NOTE — Anesthesia Procedure Notes (Signed)
Performed by: Vaughan Sine Pre-anesthesia Checklist: Patient identified, Emergency Drugs available, Suction available, Patient being monitored and Timeout performed Patient Re-evaluated:Patient Re-evaluated prior to inductionOxygen Delivery Method: Nasal cannula Preoxygenation: Pre-oxygenation with 100% oxygen Intubation Type: IV induction Airway Equipment and Method: Bite block

## 2014-09-19 NOTE — Op Note (Signed)
[facility] Patient Name: Kenneth Osborn Procedure Date: 09/19/2014 7:49 AM MRN: [id number] Account #: [id number] Date of Birth: [birth year] Admit Type: Outpatient Age: 59 Room: [facility] ENDO ROOM 1 Gender: Male Note Status: Finalized Procedure:         Colonoscopy Indications:       Personal history of colonic polyps Providers:         Manya Silvas, MD Medicines:         Propofol per Anesthesia Complications:     No immediate complications. Procedure:         Pre-Anesthesia Assessment:                    - After reviewing the risks and benefits, the patient was                     deemed in satisfactory condition to undergo the procedure.                    After obtaining informed consent, the colonoscope was                     passed under direct vision. Throughout the procedure, the                     patient's blood pressure, pulse, and oxygen saturations                     were monitored continuously. The Olympus PCF-H180AL                     colonoscope ( S#: [id number] ) was introduced through the                     anus and advanced to the the cecum, identified by                     appendiceal orifice and ileocecal valve. The colonoscopy                     was performed without difficulty. The patient tolerated                     the procedure well. The quality of the bowel preparation                     was good. Findings:      Four sessile polyps were found in the ascending colon. The polyps were       diminutive in size. These polyps were removed with a jumbo cold forceps.       Resection and retrieval were complete.      A diminutive polyp was found in the cecum. The polyp was sessile. The       polyp was removed with a jumbo cold forceps. Resection and retrieval       were complete.      A diminutive polyp was found in the descending colon. The polyp was       sessile. The polyp was removed with a jumbo cold forceps. Resection  and       retrieval were complete.      Internal hemorrhoids were found during endoscopy. The hemorrhoids were       small and Grade I (internal hemorrhoids that do not prolapse). Impression:        -  Four diminutive polyps in the ascending colon. Resected                     and retrieved.                    - One diminutive polyp in the cecum. Resected and                     retrieved. Recommendation:    - Await pathology results. Manya Silvas, MD 09/19/2014 8:50:46 AM This report has been signed electronically. Number of Addenda: 0 Note Initiated On: 09/19/2014 7:49 AM Scope Withdrawal Time: 0 hours 17 minutes 15 seconds  Total Procedure Duration: 0 hours 22 minutes 52 seconds       [facility]

## 2014-09-19 NOTE — Op Note (Signed)
[facility] Patient Name: Kenneth Osborn Procedure Date: 09/19/2014 7:49 AM MRN: [id number] Account #: [id number] Date of Birth: 1955/10/29 Admit Type: Outpatient Age: 59 Room: [facility] ENDO ROOM 1 Gender: Male Note Status: Finalized Procedure:         Upper GI endoscopy Indications:       Follow-up of Barrett's esophagus Providers:         Manya Silvas, MD Referring MD:      Hewitt Blade. Sarina Ser, MD (Referring MD) Medicines:         Propofol per Anesthesia Complications:     No immediate complications. Procedure:         Pre-Anesthesia Assessment:                    - After reviewing the risks and benefits, the patient was                     deemed in satisfactory condition to undergo the procedure.                    After obtaining informed consent, the endoscope was passed                     under direct vision. Throughout the procedure, the                     patient's blood pressure, pulse, and oxygen saturations                     were monitored continuously. The Olympus GIF-160 endoscope                     (S#. [id number]) was introduced through the mouth, and                     advanced to the second part of duodenum. The upper GI                     endoscopy was accomplished without difficulty. The patient                     tolerated the procedure well. Findings:      There were esophageal mucosal changes secondary to established       short-segment Barrett's disease present at the lower esophageal       sphincter. The maximum longitudinal extent of these mucosal changes was       1-2 cm in length. Biopsies were taken with a cold forceps for histology.       GEJ 40cm. No evidence of any esophageal narrowing so no dilatation was       done.      Diffuse mildly erythematous mucosa without bleeding was found in the       gastric body and in the gastric antrum. Biopsies were taken with a cold       forceps for Helicobacter pylori  testing.      The examined duodenum was normal. Impression:        - Esophageal mucosal changes secondary to established                     short-segment Barrett's disease. Biopsied.                    - Erythematous mucosa in the  gastric body and antrum.                     Biopsied.                    - Normal examined duodenum. Recommendation:    - Await pathology results. Manya Silvas, MD 09/19/2014 8:22:56 AM This report has been signed electronically. Number of Addenda: 0 Note Initiated On: 09/19/2014 7:49 AM      [facility]

## 2014-09-20 LAB — SURGICAL PATHOLOGY

## 2014-09-22 ENCOUNTER — Encounter: Payer: Self-pay | Admitting: Unknown Physician Specialty

## 2014-09-22 DIAGNOSIS — I959 Hypotension, unspecified: Secondary | ICD-10-CM | POA: Insufficient documentation

## 2014-09-22 DIAGNOSIS — I9589 Other hypotension: Secondary | ICD-10-CM

## 2014-09-22 DIAGNOSIS — E861 Hypovolemia: Secondary | ICD-10-CM | POA: Insufficient documentation

## 2014-09-26 DIAGNOSIS — N1832 Chronic kidney disease, stage 3b: Secondary | ICD-10-CM | POA: Insufficient documentation

## 2014-09-26 DIAGNOSIS — N183 Chronic kidney disease, stage 3 unspecified: Secondary | ICD-10-CM | POA: Insufficient documentation

## 2014-11-14 DIAGNOSIS — Z9889 Other specified postprocedural states: Secondary | ICD-10-CM | POA: Insufficient documentation

## 2014-12-21 ENCOUNTER — Encounter: Payer: Self-pay | Admitting: Pain Medicine

## 2014-12-21 ENCOUNTER — Other Ambulatory Visit: Payer: Self-pay | Admitting: Pain Medicine

## 2014-12-21 ENCOUNTER — Ambulatory Visit: Payer: Managed Care, Other (non HMO) | Attending: Pain Medicine | Admitting: Pain Medicine

## 2014-12-21 VITALS — BP 122/75 | HR 102 | Temp 98.6°F | Resp 16 | Ht 69.0 in | Wt 225.0 lb

## 2014-12-21 DIAGNOSIS — M7918 Myalgia, other site: Secondary | ICD-10-CM

## 2014-12-21 DIAGNOSIS — M47812 Spondylosis without myelopathy or radiculopathy, cervical region: Secondary | ICD-10-CM | POA: Diagnosis not present

## 2014-12-21 DIAGNOSIS — M961 Postlaminectomy syndrome, not elsewhere classified: Secondary | ICD-10-CM

## 2014-12-21 DIAGNOSIS — M5384 Other specified dorsopathies, thoracic region: Secondary | ICD-10-CM

## 2014-12-21 DIAGNOSIS — Z9889 Other specified postprocedural states: Secondary | ICD-10-CM | POA: Diagnosis not present

## 2014-12-21 DIAGNOSIS — M47816 Spondylosis without myelopathy or radiculopathy, lumbar region: Secondary | ICD-10-CM

## 2014-12-21 DIAGNOSIS — F1120 Opioid dependence, uncomplicated: Secondary | ICD-10-CM | POA: Insufficient documentation

## 2014-12-21 DIAGNOSIS — M47814 Spondylosis without myelopathy or radiculopathy, thoracic region: Secondary | ICD-10-CM

## 2014-12-21 DIAGNOSIS — I2510 Atherosclerotic heart disease of native coronary artery without angina pectoris: Secondary | ICD-10-CM | POA: Insufficient documentation

## 2014-12-21 DIAGNOSIS — I252 Old myocardial infarction: Secondary | ICD-10-CM | POA: Diagnosis not present

## 2014-12-21 DIAGNOSIS — Z9689 Presence of other specified functional implants: Secondary | ICD-10-CM

## 2014-12-21 DIAGNOSIS — M5416 Radiculopathy, lumbar region: Secondary | ICD-10-CM

## 2014-12-21 DIAGNOSIS — E1140 Type 2 diabetes mellitus with diabetic neuropathy, unspecified: Secondary | ICD-10-CM | POA: Diagnosis not present

## 2014-12-21 DIAGNOSIS — G4730 Sleep apnea, unspecified: Secondary | ICD-10-CM | POA: Insufficient documentation

## 2014-12-21 DIAGNOSIS — F1190 Opioid use, unspecified, uncomplicated: Secondary | ICD-10-CM | POA: Insufficient documentation

## 2014-12-21 DIAGNOSIS — M47896 Other spondylosis, lumbar region: Secondary | ICD-10-CM

## 2014-12-21 DIAGNOSIS — M545 Low back pain: Secondary | ICD-10-CM | POA: Insufficient documentation

## 2014-12-21 DIAGNOSIS — I129 Hypertensive chronic kidney disease with stage 1 through stage 4 chronic kidney disease, or unspecified chronic kidney disease: Secondary | ICD-10-CM | POA: Diagnosis not present

## 2014-12-21 DIAGNOSIS — M5412 Radiculopathy, cervical region: Secondary | ICD-10-CM

## 2014-12-21 DIAGNOSIS — M549 Dorsalgia, unspecified: Secondary | ICD-10-CM | POA: Insufficient documentation

## 2014-12-21 DIAGNOSIS — M79605 Pain in left leg: Secondary | ICD-10-CM

## 2014-12-21 DIAGNOSIS — Z79891 Long term (current) use of opiate analgesic: Secondary | ICD-10-CM | POA: Insufficient documentation

## 2014-12-21 DIAGNOSIS — G8929 Other chronic pain: Secondary | ICD-10-CM | POA: Insufficient documentation

## 2014-12-21 DIAGNOSIS — K219 Gastro-esophageal reflux disease without esophagitis: Secondary | ICD-10-CM | POA: Insufficient documentation

## 2014-12-21 DIAGNOSIS — Z5181 Encounter for therapeutic drug level monitoring: Secondary | ICD-10-CM | POA: Insufficient documentation

## 2014-12-21 DIAGNOSIS — Z8669 Personal history of other diseases of the nervous system and sense organs: Secondary | ICD-10-CM | POA: Insufficient documentation

## 2014-12-21 DIAGNOSIS — K2270 Barrett's esophagus without dysplasia: Secondary | ICD-10-CM | POA: Diagnosis not present

## 2014-12-21 DIAGNOSIS — M5442 Lumbago with sciatica, left side: Secondary | ICD-10-CM

## 2014-12-21 DIAGNOSIS — M5382 Other specified dorsopathies, cervical region: Secondary | ICD-10-CM

## 2014-12-21 DIAGNOSIS — M546 Pain in thoracic spine: Secondary | ICD-10-CM

## 2014-12-21 DIAGNOSIS — Z7901 Long term (current) use of anticoagulants: Secondary | ICD-10-CM | POA: Diagnosis not present

## 2014-12-21 DIAGNOSIS — Z79899 Other long term (current) drug therapy: Secondary | ICD-10-CM

## 2014-12-21 DIAGNOSIS — M542 Cervicalgia: Secondary | ICD-10-CM | POA: Diagnosis present

## 2014-12-21 DIAGNOSIS — M792 Neuralgia and neuritis, unspecified: Secondary | ICD-10-CM | POA: Insufficient documentation

## 2014-12-21 DIAGNOSIS — M79602 Pain in left arm: Secondary | ICD-10-CM

## 2014-12-21 DIAGNOSIS — G4733 Obstructive sleep apnea (adult) (pediatric): Secondary | ICD-10-CM | POA: Insufficient documentation

## 2014-12-21 DIAGNOSIS — M47894 Other spondylosis, thoracic region: Secondary | ICD-10-CM | POA: Insufficient documentation

## 2014-12-21 DIAGNOSIS — M539 Dorsopathy, unspecified: Secondary | ICD-10-CM

## 2014-12-21 DIAGNOSIS — M791 Myalgia: Secondary | ICD-10-CM

## 2014-12-21 HISTORY — DX: Personal history of other diseases of the nervous system and sense organs: Z86.69

## 2014-12-21 HISTORY — DX: Other specified postprocedural states: Z98.890

## 2014-12-21 MED ORDER — OXYCODONE HCL 10 MG PO TABS: 10.0000 mg | ORAL_TABLET | Freq: Four times a day (QID) | ORAL | Status: DC | PRN

## 2014-12-21 MED ORDER — GABAPENTIN 800 MG PO TABS: 1600.0000 mg | ORAL_TABLET | Freq: Two times a day (BID) | ORAL | Status: DC

## 2014-12-21 MED ORDER — CYCLOBENZAPRINE HCL 10 MG PO TABS: 10.0000 mg | ORAL_TABLET | Freq: Three times a day (TID) | ORAL | Status: DC | PRN

## 2014-12-21 NOTE — Progress Notes (Signed)
Patient's Name: Kenneth Osborn MRN: [id number] DOB: 1955/10/02 DOS: 12/21/2014  Primary Reason(s) for Visit: Encounter for Medication Management. CC: Back Pain and Neck Pain   HPI:   Kenneth Osborn is a 59 y.o. year old, male patient, who returns today as an established patient. 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 Beverely Pace at [facility] on 01/04/2013); Long term current use of opiate analgesic; Long term prescription opiate use; Opiate use; Opiate dependence ([place]); Encounter for therapeutic drug level monitoring; Neurogenic pain; Neuropathic pain; Thoracic facet syndrome (T8-10); Lumbar facet syndrome (Bilateral); Cervical facet syndrome (right side); Cervical spondylosis; Lumbar spondylosis; Chronic pain of left upper extremity; Chronic cervical radicular pain (left side); Chronic upper back pain; History of thoracic spine surgery (S/P T9-10 IVD spacer); Failed back surgical syndrome; Musculoskeletal pain; Myofascial pain; Chronic left lower extremity pain; Chronic radicular lumbar pain (left L4 dermatomal pain); Coronary artery disease; History of MI (myocardial infarction) (May 2012); Chronic anticoagulation; History of Meniere's disease; and Sleep apnea on his problem list.. His primarily concern today is the Back Pain and Neck Pain    The patient returns today after having had his spinal cord stimulator implanted by Dr. Mearl Latin at [facility] neurosurgery.  He is doing well on his medicines and since he is taking oxycodone 5 mg 2 tablets at a time, today I will be switching him to the 10 mg pills so that he can take only 1 tablet. Today's Pain Score: 3  Pain Type: Chronic pain Pain Location: Back (pain is mostly from surgery SCS) Pain Descriptors / Indicators: Sharp Pain Frequency: Constant  Date of Last Visit: Date of Last Visit: 09/22/14 Service Provided on Last Visit: Service Provided on Last Visit: Med Refill  Pharmacotherapy Review:   Side-effects or Adverse reactions: None reported. Effectiveness: Described as relatively effective, allowing for increase in activities of daily living (ADL). Onset of action: Within expected pharmacological parameters. Duration of action: Within normal limits for medication. Peak effect: Timing and results are as within normal expected parameters. [place] PMP: Compliant with practice rules and regulations. DST: Compliant with practice rules and regulations. Lab work: No new labs ordered by our practice. Treatment compliance: Compliant. Substance Use Disorder (SUD) Risk Level: Low Planned course of action: Continue therapy as is.  Allergies: Kenneth Osborn has No Known Allergies.  Meds: The patient has a current medication list which includes the following prescription(s): alprazolam, aspirin ec, atorvastatin, cetirizine, cyclobenzaprine, diazepam, diltiazem, enalapril, esomeprazole, ezetimibe, gabapentin, insulin glargine, insulin lispro protamine-lispro, meclizine, metformin, metoprolol succinate, niacin, nitroglycerin, fish oil, prasugrel, oxycodone hcl, oxycodone hcl, and oxycodone hcl. Requested Prescriptions   Signed Prescriptions Disp Refills  . cyclobenzaprine (FLEXERIL) 10 MG tablet 90 tablet 2    Sig: Take 1 tablet (10 mg total) by mouth every 8 (eight) hours as needed for muscle spasms.  Marland Kitchen gabapentin (NEURONTIN) 800 MG tablet 120 tablet 2    Sig: Take 2 tablets (1,600 mg total) by mouth 2 (two)  times daily.  . Oxycodone HCl 10 MG TABS 120 tablet 0    Sig: Take 1 tablet (  10 mg total) by mouth every 6 (six) hours as needed.  . Oxycodone HCl 10 MG TABS 120 tablet 0    Sig: Take 1 tablet (10 mg total) by mouth every 6 (six) hours as needed.  . Oxycodone HCl 10 MG TABS 120 tablet 0    Sig: Take 1 tablet (10 mg total) by mouth every 6 (six) hours as needed.    ROS: Constitutional: Afebrile, no chills, well hydrated and well nourished Gastrointestinal: negative Musculoskeletal:negative Neurological: negative Behavioral/Psych: negative  PFSH: Medical:  Kenneth Osborn  has a past medical history of Myocardial infarction ([place]); Coronary artery disease; Hypertension; Meniere's disease; Bronchitis; Diabetes mellitus without complication ([place]); GERD (gastroesophageal reflux disease); Neuromuscular disorder ([place]); Chronic back pain; Sleep apnea; Hyperlipidemia; DDD (degenerative disc disease), cervical; Short-segment Barrett's esophagus; Dysphagia; Psychosis; COPD (chronic obstructive pulmonary disease) ([place]); Hypercholesteremia; History of thoracic spine surgery (S/P T9-10 IVD spacer) (12/21/2014); and History of Meniere's disease (12/21/2014). Family: family history includes Cancer in his sister; Diabetes in his mother; Heart disease in his father, maternal aunt, maternal uncle, and mother. Surgical:  has past surgical history that includes Cardiovascular stress test; Cardiac catheterization; Coronary angioplasty; Labrinthectomy; mastoid shunt; Appendectomy; Back surgery; Shoulder arthroscopy with subacromial decompression (Left, 04/06/2012); ARTHRODESIS ANTERIOR ANTERIOR CERVICLE SPINE (01/04/2013); Colonoscopy with propofol (N/A, 09/19/2014); Esophagogastroduodenoscopy (N/A, 09/19/2014); Savory dilation (N/A, 09/19/2014); and Spinal cord stimulator implant (Right). Tobacco:  reports that he has never smoked. He has never used smokeless tobacco. Alcohol:  reports that he does not drink alcohol. Drug:   reports that he does not use illicit drugs.  Physical Exam: Vitals:  Today's Vitals   12/21/14 1034 12/21/14 1058  BP: 122/75   Pulse: 102   Temp: 98.6 F (37 C)   TempSrc: Oral   Resp: 16   Height: 5' 9"  (1.753 m)   Weight: 225 lb (102.059 kg)   SpO2: 97%   PainSc: 3  3   PainLoc: Back   Calculated BMI: Body mass index is 33.21 kg/(m^2). General appearance: alert, cooperative, appears stated age, no distress and moderately obese Eyes: conjunctivae/corneas clear. PERRL, EOM's intact. Fundi benign. Lungs: No evidence respiratory distress, no audible rales or ronchi and no use of accessory muscles of respiration Neck: no adenopathy, no carotid bruit, no JVD, supple, symmetrical, trachea midline and thyroid not enlarged, symmetric, no tenderness/mass/nodules Back: symmetric, no curvature. ROM normal. No CVA tenderness. Extremities: extremities normal, atraumatic, no cyanosis or edema Pulses: 2+ and symmetric Skin: Skin color, texture, turgor normal. No rashes or lesions Neurologic: Grossly normal    Assessment: Encounter Diagnosis:  Primary Diagnosis: Chronic pain [G89.29]  Plan: Kenneth Osborn was seen today for back pain and neck pain.  Diagnoses and all orders for this visit:  Chronic pain -     COMPLETE METABOLIC PANEL WITH GFR; Future -     C-reactive protein; Future -     Magnesium; Future -     Sedimentation rate; Future -     Vitamin D2,D3 Panel; Future -     cyclobenzaprine (FLEXERIL) 10 MG tablet; Take 1 tablet (10 mg total) by mouth every 8 (eight) hours as needed for muscle spasms. -     Oxycodone HCl 10 MG TABS; Take 1 tablet (10 mg total) by mouth every 6 (six) hours as needed. -     Oxycodone HCl 10 MG TABS; Take 1 tablet (10 mg total) by mouth every 6 (six) hours as needed. -     Oxycodone HCl 10 MG TABS; Take 1 tablet (  10 mg total) by mouth every 6 (six) hours as needed.  S/P insertion of spinal cord stimulator  Chronic low back pain  Failed cervical  surgery syndrome  Long term current use of opiate analgesic -     Drugs of abuse screen w/o alc, rtn urine-sln; Future  Long term prescription opiate use  Opiate use  Uncomplicated opioid dependence ([place])  Encounter for therapeutic drug level monitoring  Neurogenic pain  Neuropathic pain -     gabapentin (NEURONTIN) 800 MG tablet; Take 2 tablets (1,600 mg total) by mouth 2 (two) times daily.  Thoracic facet syndrome (T8-10)  Lumbar facet syndrome (Bilateral)  Cervical facet syndrome (right side)  Cervical spondylosis  Other osteoarthritis of spine, lumbar region  Chronic pain of left upper extremity  Chronic cervical radicular pain (left side)  Chronic upper back pain  History of thoracic spine surgery (S/P T9-10 IVD spacer)  Failed back surgical syndrome  Musculoskeletal pain  Myofascial pain  Chronic left lower extremity pain  Chronic radicular lumbar pain (left L4 dermatomal pain)  History of MI (myocardial infarction) (May 2012)  Chronic anticoagulation  History of Meniere's disease  Sleep apnea     There are no Patient Instructions on file for this visit. Medications discontinued today:  Medications Discontinued During This Encounter  Medication Reason  . celecoxib (CELEBREX) 200 MG capsule Error  . glimepiride (AMARYL) 4 MG tablet Error  . isosorbide mononitrate (IMDUR) 30 MG 24 hr tablet Error  . triamterene-hydrochlorothiazide (MAXZIDE) 75-50 MG per tablet Error  . oxyCODONE (OXY IR/ROXICODONE) 5 MG immediate release tablet Cost of medication  . magnesium oxide (MAG-OX) 400 MG tablet Discontinued by provider  . cyclobenzaprine (FLEXERIL) 10 MG tablet Reorder  . gabapentin (NEURONTIN) 800 MG tablet Reorder   Medications administered today:  Kenneth Osborn had no medications administered during this visit.  Primary Care Physician: Madelyn Brunner, MD Location: [facility] Note by: Kathlen Brunswick Dossie Arbour, M.D,  DABA, DABAPM, DABPM, DABIPP, FIPP

## 2014-12-21 NOTE — Progress Notes (Signed)
Safety precautions to be maintained throughout the outpatient stay will include: orient to surroundings, keep bed in low position, maintain call bell within reach at all times, provide assistance with transfer out of bed and ambulation.  Pill count oxycodone #16/240

## 2014-12-30 LAB — TOXASSURE SELECT 13 (MW), URINE: PDF: 0

## 2015-01-24 ENCOUNTER — Other Ambulatory Visit: Payer: Self-pay | Admitting: Pain Medicine

## 2015-03-15 DIAGNOSIS — E1165 Type 2 diabetes mellitus with hyperglycemia: Secondary | ICD-10-CM | POA: Insufficient documentation

## 2015-03-15 DIAGNOSIS — E1142 Type 2 diabetes mellitus with diabetic polyneuropathy: Secondary | ICD-10-CM | POA: Insufficient documentation

## 2015-03-15 DIAGNOSIS — Z794 Long term (current) use of insulin: Secondary | ICD-10-CM

## 2015-03-22 ENCOUNTER — Encounter: Payer: Self-pay | Admitting: Pain Medicine

## 2015-03-22 ENCOUNTER — Other Ambulatory Visit: Payer: Self-pay | Admitting: Pain Medicine

## 2015-03-22 ENCOUNTER — Ambulatory Visit: Payer: Managed Care, Other (non HMO) | Attending: Pain Medicine | Admitting: Pain Medicine

## 2015-03-22 VITALS — BP 107/72 | HR 97 | Temp 97.8°F | Resp 18 | Ht 69.0 in | Wt 225.0 lb

## 2015-03-22 DIAGNOSIS — M47812 Spondylosis without myelopathy or radiculopathy, cervical region: Secondary | ICD-10-CM | POA: Diagnosis not present

## 2015-03-22 DIAGNOSIS — E7801 Familial hypercholesterolemia: Secondary | ICD-10-CM | POA: Insufficient documentation

## 2015-03-22 DIAGNOSIS — K219 Gastro-esophageal reflux disease without esophagitis: Secondary | ICD-10-CM | POA: Insufficient documentation

## 2015-03-22 DIAGNOSIS — M4802 Spinal stenosis, cervical region: Secondary | ICD-10-CM | POA: Diagnosis not present

## 2015-03-22 DIAGNOSIS — J449 Chronic obstructive pulmonary disease, unspecified: Secondary | ICD-10-CM | POA: Insufficient documentation

## 2015-03-22 DIAGNOSIS — I129 Hypertensive chronic kidney disease with stage 1 through stage 4 chronic kidney disease, or unspecified chronic kidney disease: Secondary | ICD-10-CM | POA: Diagnosis not present

## 2015-03-22 DIAGNOSIS — Z9889 Other specified postprocedural states: Secondary | ICD-10-CM | POA: Diagnosis not present

## 2015-03-22 DIAGNOSIS — M545 Low back pain, unspecified: Secondary | ICD-10-CM

## 2015-03-22 DIAGNOSIS — I2510 Atherosclerotic heart disease of native coronary artery without angina pectoris: Secondary | ICD-10-CM | POA: Diagnosis not present

## 2015-03-22 DIAGNOSIS — IMO0001 Reserved for inherently not codable concepts without codable children: Secondary | ICD-10-CM

## 2015-03-22 DIAGNOSIS — Z5181 Encounter for therapeutic drug level monitoring: Secondary | ICD-10-CM

## 2015-03-22 DIAGNOSIS — M47816 Spondylosis without myelopathy or radiculopathy, lumbar region: Secondary | ICD-10-CM | POA: Insufficient documentation

## 2015-03-22 DIAGNOSIS — Z462 Encounter for fitting and adjustment of other devices related to nervous system and special senses: Secondary | ICD-10-CM | POA: Insufficient documentation

## 2015-03-22 DIAGNOSIS — H8109 Meniere's disease, unspecified ear: Secondary | ICD-10-CM | POA: Diagnosis not present

## 2015-03-22 DIAGNOSIS — I252 Old myocardial infarction: Secondary | ICD-10-CM | POA: Diagnosis not present

## 2015-03-22 DIAGNOSIS — F1190 Opioid use, unspecified, uncomplicated: Secondary | ICD-10-CM | POA: Diagnosis not present

## 2015-03-22 DIAGNOSIS — K2270 Barrett's esophagus without dysplasia: Secondary | ICD-10-CM | POA: Diagnosis not present

## 2015-03-22 DIAGNOSIS — M5412 Radiculopathy, cervical region: Secondary | ICD-10-CM | POA: Diagnosis not present

## 2015-03-22 DIAGNOSIS — G8929 Other chronic pain: Secondary | ICD-10-CM

## 2015-03-22 DIAGNOSIS — M549 Dorsalgia, unspecified: Secondary | ICD-10-CM | POA: Diagnosis present

## 2015-03-22 DIAGNOSIS — Z79891 Long term (current) use of opiate analgesic: Secondary | ICD-10-CM

## 2015-03-22 DIAGNOSIS — R1310 Dysphagia, unspecified: Secondary | ICD-10-CM

## 2015-03-22 DIAGNOSIS — E1342 Other specified diabetes mellitus with diabetic polyneuropathy: Secondary | ICD-10-CM | POA: Insufficient documentation

## 2015-03-22 DIAGNOSIS — Z9689 Presence of other specified functional implants: Secondary | ICD-10-CM | POA: Diagnosis not present

## 2015-03-22 DIAGNOSIS — M792 Neuralgia and neuritis, unspecified: Secondary | ICD-10-CM

## 2015-03-22 LAB — COMPREHENSIVE METABOLIC PANEL
ALBUMIN: 4.1 g/dL (ref 3.5–5.0)
ALT: 34 U/L (ref 17–63)
ANION GAP: 9 (ref 5–15)
AST: 27 U/L (ref 15–41)
Alkaline Phosphatase: 84 U/L (ref 38–126)
BILIRUBIN TOTAL: 0.4 mg/dL (ref 0.3–1.2)
BUN: 32 mg/dL — ABNORMAL HIGH (ref 6–20)
CO2: 23 mmol/L (ref 22–32)
Calcium: 9.8 mg/dL (ref 8.9–10.3)
Chloride: 104 mmol/L (ref 101–111)
Creatinine, Ser: 1.47 mg/dL — ABNORMAL HIGH (ref 0.61–1.24)
GFR calc non Af Amer: 50 mL/min — ABNORMAL LOW (ref >60)
GFR, EST AFRICAN AMERICAN: 58 mL/min — AB (ref >60)
GLUCOSE: 324 mg/dL — AB (ref 65–99)
POTASSIUM: 5.2 mmol/L — AB (ref 3.5–5.1)
Sodium: 136 mmol/L (ref 135–145)
TOTAL PROTEIN: 7.2 g/dL (ref 6.5–8.1)

## 2015-03-22 LAB — MAGNESIUM: MAGNESIUM: 1.7 mg/dL (ref 1.7–2.4)

## 2015-03-22 LAB — SEDIMENTATION RATE: SED RATE: 9 mm/h (ref 0–20)

## 2015-03-22 LAB — C-REACTIVE PROTEIN: CRP: 1.4 mg/dL — AB (ref <1.0)

## 2015-03-22 MED ORDER — OXYCODONE HCL 10 MG PO TABS
10.0000 mg | ORAL_TABLET | Freq: Four times a day (QID) | ORAL | Status: DC | PRN
Start: 2015-03-22 — End: 2015-06-14

## 2015-03-22 MED ORDER — CYCLOBENZAPRINE HCL 10 MG PO TABS: 10.0000 mg | ORAL_TABLET | Freq: Three times a day (TID) | ORAL | Status: DC | PRN

## 2015-03-22 MED ORDER — GABAPENTIN 800 MG PO TABS: 1600.0000 mg | ORAL_TABLET | Freq: Two times a day (BID) | ORAL | Status: DC

## 2015-03-22 MED ORDER — OXYCODONE HCL 10 MG PO TABS: 10.0000 mg | ORAL_TABLET | Freq: Four times a day (QID) | ORAL | Status: DC | PRN

## 2015-03-22 NOTE — Progress Notes (Signed)
Safety precautions to be maintain Oxycodone pill count # 120/120  Filled 03/19/15.

## 2015-03-22 NOTE — Progress Notes (Signed)
Patient's Name: Kenneth Osborn MRN: [id number] DOB: [birth year] DOS: 03/22/2015  Primary Reason(s) for Visit: Encounter for Medication Management CC: Back Pain   HPI  Mr. Brackins is a 60 y.o. year old, male patient, who returns today as an established patient. 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 Beverely Pace at [facility] on 01/04/2013); Long term current use of opiate analgesic; Long term prescription opiate use; Opiate use; Opiate dependence ([place]); Encounter for therapeutic drug level monitoring; Neurogenic pain; Neuropathic pain; Thoracic facet syndrome (T8-10); Lumbar facet syndrome (Bilateral); Cervical facet syndrome (right side); Cervical spondylosis; Lumbar spondylosis; Chronic pain of left upper extremity; Chronic cervical radicular pain (left side); Chronic upper back pain; History of thoracic spine surgery (S/P T9-10 IVD spacer); Failed back surgical syndrome; Musculoskeletal pain; Myofascial pain; Chronic left lower extremity pain; Chronic radicular lumbar pain (left L4 dermatomal pain); Coronary artery disease; History of MI (myocardial infarction) (May 2012); Chronic anticoagulation (on [place]); History of Meniere's disease; Sleep apnea; Type 2 diabetes mellitus with hyperglycemia ([place]); Dysphagia; Encounter for interrogation of neurostimulator; and Status post insertion of spinal cord stimulator on his problem list.. His primarily concern  today is the Back Pain   The patient returns to the clinic today for pharmacological management of his chronic pain. On 11/01/2014 he had his lumbar spinal cord stimulator implanted by Dr. Leonia Corona, ([facility]) who implanted a Medtronic MRI compatible neurostimulator. The patient indicates doing rather well with the device and not having any problems in the area covered by device. He returns today requesting refills on his medications including the Celebrex. I have denied day refill on the Celebrex since it was written for 200 g 3 times a day and in view of the fact that he is on chronic anticoagulation, I do not feel that it is safe to continue that regimen due to the risk of GI bleeding. Along those lines, the patient has been having problems with dysphagia since November 2015 when he had the anterior cervical discectomy and fusion. He indicates that he had an upper endoscopy done some time ago by Dr. Vira Agar, who told him that he had "stretched it" as much as she could. There is no clear evidence on the chart that he has a history of esophageal strictures, but at this point I will be sending him for another consult with Dr. Vira Agar at as well as a barium swallow since he indicates having trouble swallowing small things.  Reported Pain Score: 3  Reported level is inconsistent with clinical obrservations. Pain Type: Chronic pain Pain Location: Back Pain Orientation: Mid Pain Descriptors / Indicators: Sharp Pain Frequency: Constant   Pharmacotherapy  Medication(s): We have been prescribing for this patient Flexeril 10 mg one tablet every 8 hours when necessary for pain, gabapentin 800 mg 2 tablets by mouth twice a day, and oxycodone IR 10 mg every 6 hours when necessary for pain. The following evaluation is for the opioid. Onset of action: Within expected pharmacological parameters. (10-15 minutes without food and 1 hour when he takes it with food) Time to Peak  effect: Timing and results  are as within normal expected parameters. (1-2 hours without food and 2 hours with food) Analgesic Effect: More than 50% (75%) Activity Facilitation: Medication(s) allow patient to sit, stand, walk, and do the basic ADLs Perceived Effectiveness: Described as relatively effective, allowing for increase in activities of daily living (ADL) Side-effects or Adverse reactions: None reported Duration of action: Within normal limits for medication. (4-6 hours) [place] PMP: Compliant with practice rules and regulations UDS Results: Last UDS done on 11-16 came back with unexpected results. It was positive for alcohol, but because glucose was also present on the sample, this is likely to be secondary to the diabetes. In addition, it was positive for oxazepam and temazepam, both of which are metabolites for diazepam. The patient was provided with a warning with regards to this based on the CDC guidelines. UDS Interpretation: Abnormal results discussed with patient Medication Assessment Form: Reviewed. Patient indicates being compliant with therapy Treatment compliance: Compliant Substance Use Disorder (SUD) Risk Level: Low Pharmacologic Plan: Continue therapy as is  Lab Work: Illicit Drugs No results found for: THCU, COCAINSCRNUR, PCPSCRNUR, MDMA, AMPHETMU, METHADONE, ETOH  Inflammation Markers Lab Results  Component Value Date   ESRSEDRATE 7 05/14/2013    Renal Function Lab Results  Component Value Date   BUN 12 02/24/2014   CREATININE 1.24 02/24/2014   GFRAA >60 02/24/2014   GFRNONAA >60 02/24/2014    Hepatic Function Lab Results  Component Value Date   AST 48* 08/18/2013   ALT 70 08/18/2013   ALBUMIN 3.8 08/18/2013    Electrolytes Lab Results  Component Value Date   NA 142 02/24/2014   K 3.9 02/24/2014   CL 106 02/24/2014   CALCIUM 8.4* 02/24/2014   MG 1.5* 05/14/2013    Allergies  Mr. Callow has No Known Allergies.  Meds  The patient has a current medication list which  includes the following prescription(s): accu-chek aviva plus, alprazolam, aspirin ec, azithromycin, celecoxib, cetirizine, cyclobenzaprine, diazepam, diltiazem, enalapril, esomeprazole, ezetimibe, gabapentin, humalog mix 75/25 kwikpen, insulin glargine, insulin lispro, pen needles 31gx5/16", insulin syringe-needle u-100, magnesium oxide, meclizine, metformin, metoprolol succinate, montelukast, niacin, nitroglycerin, fish oil, oxycodone hcl, oxycodone hcl, oxycodone hcl, prasugrel, ranitidine, rosuvastatin, and triamterene-hydrochlorothiazide.  Current Outpatient Prescriptions on File Prior to Visit  Medication Sig  . ALPRAZolam (XANAX) 0.5 MG tablet Take 0.5 mg by mouth at bedtime as needed for anxiety.  Marland Kitchen aspirin EC 81 MG tablet Take 81 mg by mouth daily.  . cetirizine (ZYRTEC) 10 MG tablet Take 10 mg by mouth daily.  . diazepam (VALIUM) 5 MG tablet Take 5 mg by mouth 3 (three) times daily.  Marland Kitchen diltiazem (DILACOR XR) 240 MG 24 hr capsule Take 240 mg by mouth daily.  Marland Kitchen esomeprazole (NEXIUM) 40 MG capsule Take 40 mg by mouth 2 (two) times daily.  Marland Kitchen ezetimibe (ZETIA) 10 MG tablet Take 10 mg by mouth daily.  . insulin glargine (LANTUS) 100 UNIT/ML injection Inject 100 Units into the skin at bedtime.   . meclizine (ANTIVERT) 25 MG tablet Take 25 mg by mouth 3 (three) times daily.  . metFORMIN (GLUCOPHAGE) 1000 MG tablet Take 1,000 mg by mouth 2 (two) times daily with a meal.  . metoprolol succinate (TOPROL-XL) 50 MG 24 hr tablet Take 100 mg by mouth daily. Take with or immediately following a meal.  . niacin (NIASPAN) 500 MG CR tablet Take 500 mg by mouth at bedtime.  . nitroGLYCERIN (NITROSTAT) 0.4 MG SL tablet Place 0.4 mg under the  tongue every 5 (five) minutes as needed for chest pain (chest pain).  . Omega-3 Fatty Acids (FISH OIL) 1200 MG CAPS Take 1,200 mg by mouth 2 (two) times daily.  . prasugrel (EFFIENT) 10 MG TABS Take 10 mg by mouth daily.   No current facility-administered medications on  file prior to visit.    ROS  Constitutional: Afebrile, no chills, well hydrated and well nourished Gastrointestinal: negative Musculoskeletal:negative Neurological: negative Behavioral/Psych: negative  PFSH  Medical:  Mr. Diiorio  has a past medical history of Myocardial infarction ([place]); Coronary artery disease; Hypertension; Meniere's disease; Bronchitis; Diabetes mellitus without complication ([place]); GERD (gastroesophageal reflux disease); Neuromuscular disorder ([place]); Chronic back pain; Sleep apnea; Hyperlipidemia; DDD (degenerative disc disease), cervical; Short-segment Barrett's esophagus; Dysphagia; Psychosis; COPD (chronic obstructive pulmonary disease) ([place]); Hypercholesteremia; History of thoracic spine surgery (S/P T9-10 IVD spacer) (12/21/2014); and History of Meniere's disease (12/21/2014). Family: family history includes Cancer in his sister; Diabetes in his mother; Heart disease in his father, maternal aunt, maternal uncle, and mother. Surgical:  has past surgical history that includes Cardiovascular stress test; Cardiac catheterization; Coronary angioplasty; Labrinthectomy; mastoid shunt; Appendectomy; Back surgery; Shoulder arthroscopy with subacromial decompression (Left, 04/06/2012); ARTHRODESIS ANTERIOR ANTERIOR CERVICLE SPINE (01/04/2013); Colonoscopy with propofol (N/A, 09/19/2014); Esophagogastroduodenoscopy (N/A, 09/19/2014); Savory dilation (N/A, 09/19/2014); and Spinal cord stimulator implant (Right). Tobacco:  reports that he has never smoked. He has never used smokeless tobacco. Alcohol:  reports that he does not drink alcohol. Drug:  reports that he does not use illicit drugs.  Physical Exam  Vitals:  Today's Vitals   03/22/15 0814 03/22/15 0815  BP:  107/72  Pulse: 97   Temp: 97.8 F (36.6 C)   TempSrc: Oral   Resp: 18   Height: 5' 9"  (1.753 m)   Weight: 225 lb (102.059 kg)   SpO2: 98%   PainSc: 3  3   PainLoc: Back     Calculated BMI: Body mass index is 33.21  kg/(m^2).  General appearance: alert, cooperative, appears stated age, no distress and mildly obese Eyes: PERLA Respiratory: No evidence respiratory distress, no audible rales or ronchi and no use of accessory muscles of respiration  Cervical Spine Inspection: Normal anatomy Alignment: Symetrical ROM: Adequate  Upper Extremities Inspection: No gross anomalies detected ROM: Adequate Sensory: Normal Motor: Unremarkable  Thoracic Spine Inspection: No gross anomalies detected Alignment: Symetrical ROM: Adequate Palpation: WNL  Lumbar Spine Inspection: No gross anomalies detected Alignment: Symetrical ROM: Adequate Gait: WNL  Lower Extremities Inspection: No gross anomalies detected ROM: Adequate Sensory:  Normal Motor: Unremarkable  Assessment & Plan  Primary Diagnosis & Pertinent Problem List: The primary encounter diagnosis was Chronic pain. Diagnoses of Chronic low back pain, Cervical spondylosis, Chronic cervical radicular pain (left side), Encounter for therapeutic drug level monitoring, Long term current use of opiate analgesic, Neuropathic pain, Can't get food down, Dysphagia, and Status post insertion of spinal cord stimulator were also pertinent to this visit.  Visit Diagnosis: 1. Chronic pain   2. Chronic low back pain   3. Cervical spondylosis   4. Chronic cervical radicular pain (left side)   5. Encounter for therapeutic drug level monitoring   6. Long term current use of opiate analgesic   7. Neuropathic pain   8. Can't get food down   9. Dysphagia   10. Status post insertion of spinal cord stimulator     Assessment: Status post insertion of spinal cord stimulator Device implanted by Dr. Eber Jones LAD ([facility]) on 11/01/2014. Device seemed to  be working well for controlling his lower body pain. His implant is a Medtronic implant (MRI compatible).   Plan of Care  Pharmacotherapy (Medications Ordered): Meds ordered this encounter  Medications   . cyclobenzaprine (FLEXERIL) 10 MG tablet    Sig: Take 1 tablet (10 mg total) by mouth every 8 (eight) hours as needed for muscle spasms.    Dispense:  90 tablet    Refill:  2    Do not place this medication, or any other prescription from our practice, on "Automatic Refill". Patient may have prescription filled one day early if pharmacy is closed on scheduled refill date.  . Oxycodone HCl 10 MG TABS    Sig: Take 1 tablet (10 mg total) by mouth every 6 (six) hours as needed.    Dispense:  120 tablet    Refill:  0    Do not place this medication, or any other prescription from our practice, on "Automatic Refill". Patient may have prescription filled one day early if pharmacy is closed on scheduled refill date. Do not fill until: 03/22/15 To last until: 04/21/15  . Oxycodone HCl 10 MG TABS    Sig: Take 1 tablet (10 mg total) by mouth every 6 (six) hours as needed.    Dispense:  120 tablet    Refill:  0    Do not place this medication, or any other prescription from our practice, on "Automatic Refill". Patient may have prescription filled one day early if pharmacy is closed on scheduled refill date. Do not fill until: 04/21/15 To last until: 05/21/15  . Oxycodone HCl 10 MG TABS    Sig: Take 1 tablet (10 mg total) by mouth every 6 (six) hours as needed.    Dispense:  120 tablet    Refill:  0    Do not place this medication, or any other prescription from our practice, on "Automatic Refill". Patient may have prescription filled one day early if pharmacy is closed on scheduled refill date. Do not fill until: 05/21/15 To last until: 06/20/15  . gabapentin (NEURONTIN) 800 MG tablet    Sig: Take 2 tablets (1,600 mg total) by mouth 2 (two) times daily.    Dispense:  120 tablet    Refill:  2    Do not place this medication, or any other prescription from our practice, on "Automatic Refill". Patient may have prescription filled one day early if pharmacy is closed on scheduled refill date.     Lab-work & Procedure Ordered: Orders Placed This Encounter  Procedures  . DG Esophagus    Standing Status: Future     Number of Occurrences:      Standing Expiration Date: 05/19/2016    Scheduling Instructions:     Please evaluate this patient for a complaint of dysphagia since anterior cervical discectomy and fusion on November 2015. Lately he has been having problems swallowing small things.    Order Specific Question:  Reason for Exam (SYMPTOM  OR DIAGNOSIS REQUIRED)    Answer:  Dysphagia (difficulty swallowing) since anterior cervical discectomy and fusion on November 2015.    Order Specific Question:  Preferred imaging location?    Answer:  [facility]    Order Specific Question:  Call Results- Best Contact Number?    Answer:  Please send results to Dr. Vira Agar (gastroenterology)  . Drugs of abuse screen w/o alc, rtn urine-sln    Volume: 10 ml(s). Minimum 3 ml of urine is needed. Document temperature of fresh sample. Indications: Long  term (current) use of opiate analgesic (Z79.891) Test#: [id number] ([employer] Select-13)  . Comprehensive metabolic panel    Order Specific Question:  Has the patient fasted?    Answer:  No  . C-reactive protein  . Magnesium  . Sedimentation rate  . Vitamin B12    Indication: Bone Pain (M89.9)  . Vitamin D pnl(25-hydrxy+1,25-dihy)-bld  . Ambulatory referral to Gastroenterology    Referral Priority:  Routine    Referral Type:  Consultation    Referral Reason:  Specialty Services Required    Referred to Provider:  Manya Silvas, MD    Number of Visits Requested:  1    Imaging Ordered: AMB REFERRAL TO GASTROENTEROLOGY DG ESOPHAGUS  Interventional Therapies: Scheduled: None at this time PRN Procedures: None at this time    Referral(s) or Consult(s): Referral to gastroenterology for possible esophageal strictures.  Medications administered during this visit: Mr. Rambert had no medications administered during this visit.  Future  Appointments Date Time Provider [place]  06/14/2015 8:20 AM Milinda Pointer, MD [facility] None    Primary Care Physician: Madelyn Brunner, MD Location: [facility] Facility Note by: Kathlen Brunswick Dossie Arbour, M.D, DABA, DABAPM, DABPM, DABIPP, FIPP

## 2015-03-22 NOTE — Patient Instructions (Addendum)
Flexeril and Gabapentin sent to pharmacy for pick up.  Patient informed that he is ordered labwork at pre admit testing.  Patient states understanding.

## 2015-03-22 NOTE — Assessment & Plan Note (Signed)
Device implanted by Dr. Eber Jones LAD ([facility]) on 11/01/2014. Device seemed to be working well for controlling his lower body pain. His implant is a Medtronic implant (MRI compatible).

## 2015-03-28 LAB — TOXASSURE SELECT 13 (MW), URINE: PDF: 0

## 2015-04-01 NOTE — Progress Notes (Signed)
Quick Note:   Normal levels of C-Reactive Protein for our Lab are less than 1.0 mg/L. C-reactive protein (CRP) is produced by the liver. The level of CRP rises when there is inflammation throughout the body. CRP goes up in response to inflammation. High levels suggests the presence of chronic inflammation but do not identify its location or cause. High levels have been observed in obese patients, individuals with bacterial infections, chronic inflammation, or flare-ups of inflammatory conditions. Drops of previously elevated levels suggest that the inflammation or infection is subsiding and/or responding to treatment. ______

## 2015-04-01 NOTE — Progress Notes (Signed)
Quick Note:   Normal Potassium levels are between 3.5 and 5.0 mEq/L. Levels between 5.1 and 6.0 are considered to be mild hyperkalemia. Between 6.1 to 7.0 are moderate, and anything above 7.0 mEq/L is considered to be severe. Hyperkalemia can result from increased potassium intake, decreased potassium excretion, or a shift of potassium from the intracellular to the extracellular space. The most common causes involve decreased excretion. The most common complaints are weakness and fatigue. Occasionally, a patient may complain of frank muscle paralysis or shortness of breath. Patients also may complain of palpitations or chest pain. Patients may report nausea, vomiting, and paresthesias. Levels higher than 5.5 are considered significant and those above 7 mEq/L are associated with significant hemodynamic and neurological consequences.  Normal fasting (NPO x 8 hours) glucose levels are between 65-99 mg/dl, with 2 hour fasting, levels are usually less than 140 mg/dl. Any random blood glucose level greater than 200 mg/dl is considered to be Diabetes.  BUN levels between 7 to 20 mg/dL (2.5 to 7.1 mmol/L) are considered normal. Elevated blood urea nitrogen can also be due to: urinary tract obstruction; congestive heart failure or recent heart attack; gastrointestinal bleeding; dehydration; shock; severe burns; certain medications, such as corticosteroids and some antibiotics; and/or a high protein diet.  Normal Creatinine levels are between 0.5 and 0.9 mg/dl for our lab. Any condition that impairs the function of the kidneys is likely to raise the creatinine level in the blood. The most common causes of longstanding (chronic) kidney disease in adults are high blood pressure and diabetes. Other causes of elevated blood creatinine levels include drugs, ingestion of a large amount of dietary meat, kidney infections, rhabdomyolysis (abnormal muscle breakdown), and urinary tract obstruction.  BUN-to-creatinine ratio  >20:1 (BUN dispropertionally higher than the creatinine levels) suggests prerenal azotemia (dehydration or renal hypoperfusion), while <10:1 levels suggest renal damage.  eGFR (Estimated Glomerular Filtration Rate) results are reported as milliliters/minute/1.97m (mL/min/1.789m. Because some laboratories do not collect information on a patient's race when the sample is collected for testing, they may report calculated results for both African Americans and non-African Americans.  The NaNationwide Mutual InsuranceNCasper [facility] D[facility] Centersuggests only reporting actual results once values are < 60 mL/min. 1. Normal values: 90-120 mL/min 2. Below 60 mL/min suggests that some kidney damage has occurred. 3. Between 5980nd 30 indicate (Moderate) Stage 3 kidney disease. 4. Between 29 and 15 represent (Severe) Stage 4 kidney disease. 5. Less than 15 is considered (Kidney Failure) Stage 5. ______

## 2015-04-01 NOTE — Progress Notes (Signed)
Quick Note:  Lab results reviewed and found to be within normal limits. ______ 

## 2015-06-08 DIAGNOSIS — I200 Unstable angina: Secondary | ICD-10-CM | POA: Diagnosis present

## 2015-06-14 ENCOUNTER — Ambulatory Visit: Payer: Managed Care, Other (non HMO) | Attending: Pain Medicine | Admitting: Pain Medicine

## 2015-06-14 ENCOUNTER — Encounter: Payer: Self-pay | Admitting: Pain Medicine

## 2015-06-14 VITALS — BP 129/96 | HR 95 | Temp 98.8°F | Resp 18 | Ht 69.0 in | Wt 227.0 lb

## 2015-06-14 DIAGNOSIS — K219 Gastro-esophageal reflux disease without esophagitis: Secondary | ICD-10-CM | POA: Diagnosis not present

## 2015-06-14 DIAGNOSIS — Z86010 Personal history of colonic polyps: Secondary | ICD-10-CM | POA: Diagnosis not present

## 2015-06-14 DIAGNOSIS — I2510 Atherosclerotic heart disease of native coronary artery without angina pectoris: Secondary | ICD-10-CM | POA: Insufficient documentation

## 2015-06-14 DIAGNOSIS — G8929 Other chronic pain: Secondary | ICD-10-CM | POA: Diagnosis not present

## 2015-06-14 DIAGNOSIS — E7801 Familial hypercholesterolemia: Secondary | ICD-10-CM | POA: Diagnosis not present

## 2015-06-14 DIAGNOSIS — M791 Myalgia: Secondary | ICD-10-CM | POA: Diagnosis not present

## 2015-06-14 DIAGNOSIS — Z981 Arthrodesis status: Secondary | ICD-10-CM | POA: Diagnosis not present

## 2015-06-14 DIAGNOSIS — F1190 Opioid use, unspecified, uncomplicated: Secondary | ICD-10-CM

## 2015-06-14 DIAGNOSIS — Z9689 Presence of other specified functional implants: Secondary | ICD-10-CM | POA: Diagnosis not present

## 2015-06-14 DIAGNOSIS — M25519 Pain in unspecified shoulder: Secondary | ICD-10-CM | POA: Insufficient documentation

## 2015-06-14 DIAGNOSIS — G4733 Obstructive sleep apnea (adult) (pediatric): Secondary | ICD-10-CM | POA: Insufficient documentation

## 2015-06-14 DIAGNOSIS — M7918 Myalgia, other site: Secondary | ICD-10-CM

## 2015-06-14 DIAGNOSIS — R1310 Dysphagia, unspecified: Secondary | ICD-10-CM | POA: Insufficient documentation

## 2015-06-14 DIAGNOSIS — K2270 Barrett's esophagus without dysplasia: Secondary | ICD-10-CM | POA: Insufficient documentation

## 2015-06-14 DIAGNOSIS — M542 Cervicalgia: Secondary | ICD-10-CM | POA: Diagnosis not present

## 2015-06-14 DIAGNOSIS — I252 Old myocardial infarction: Secondary | ICD-10-CM | POA: Insufficient documentation

## 2015-06-14 DIAGNOSIS — M47816 Spondylosis without myelopathy or radiculopathy, lumbar region: Secondary | ICD-10-CM | POA: Insufficient documentation

## 2015-06-14 DIAGNOSIS — M4802 Spinal stenosis, cervical region: Secondary | ICD-10-CM | POA: Insufficient documentation

## 2015-06-14 DIAGNOSIS — M47812 Spondylosis without myelopathy or radiculopathy, cervical region: Secondary | ICD-10-CM | POA: Diagnosis not present

## 2015-06-14 DIAGNOSIS — Z5181 Encounter for therapeutic drug level monitoring: Secondary | ICD-10-CM | POA: Diagnosis not present

## 2015-06-14 DIAGNOSIS — E119 Type 2 diabetes mellitus without complications: Secondary | ICD-10-CM | POA: Insufficient documentation

## 2015-06-14 DIAGNOSIS — I129 Hypertensive chronic kidney disease with stage 1 through stage 4 chronic kidney disease, or unspecified chronic kidney disease: Secondary | ICD-10-CM | POA: Diagnosis not present

## 2015-06-14 DIAGNOSIS — E1142 Type 2 diabetes mellitus with diabetic polyneuropathy: Secondary | ICD-10-CM | POA: Insufficient documentation

## 2015-06-14 DIAGNOSIS — Z79891 Long term (current) use of opiate analgesic: Secondary | ICD-10-CM

## 2015-06-14 DIAGNOSIS — M549 Dorsalgia, unspecified: Secondary | ICD-10-CM | POA: Diagnosis present

## 2015-06-14 MED ORDER — OXYCODONE HCL 10 MG PO TABS: 10.0000 mg | ORAL_TABLET | Freq: Four times a day (QID) | ORAL | Status: DC | PRN

## 2015-06-14 MED ORDER — OXYCODONE HCL 10 MG PO TABS
10.0000 mg | ORAL_TABLET | Freq: Four times a day (QID) | ORAL | Status: DC | PRN
Start: 2015-06-14 — End: 2015-06-16

## 2015-06-14 MED ORDER — CYCLOBENZAPRINE HCL 10 MG PO TABS
10.0000 mg | ORAL_TABLET | Freq: Three times a day (TID) | ORAL | Status: DC | PRN
Start: 2015-06-14 — End: 2015-09-06

## 2015-06-14 MED ORDER — CYCLOBENZAPRINE HCL 10 MG PO TABS: 10.0000 mg | ORAL_TABLET | Freq: Three times a day (TID) | ORAL | Status: DC | PRN

## 2015-06-14 NOTE — Progress Notes (Signed)
Safety precautions to be maintained throughout the outpatient stay will include: orient to surroundings, keep bed in low position, maintain call bell within reach at all times, provide assistance with transfer out of bed and ambulation. Oxycodone pill count 10 mg #44/120  Filled 05-25-15

## 2015-06-14 NOTE — Progress Notes (Signed)
Patient's Name: Kenneth Osborn  Patient type: Established  MRN: [id number]  Service setting: Ambulatory outpatient  DOB: [birth year]  Location: [facility]  DOS: 06/14/2015  Primary Care Physician: Madelyn Brunner, MD  Note by: Kathlen Brunswick Dossie Arbour, M.D, DABA, DABAPM, DABPM, DABIPP, FIPP  Referring Physician: Madelyn Brunner, MD  Specialty: Board-Certified Interventional Pain Management     Primary Reason(s) for Visit: Encounter for prescription drug management (Level of risk: moderate) CC: Back Pain   HPI  Mr. Treese is a 60 y.o. year old, male patient, who returns today as an established patient. 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 Beverely Pace at [facility] on 01/04/2013); Long term current use of opiate analgesic; Long term prescription opiate use; Opiate use (60 MME/Day); Opiate dependence ([place]); Encounter for therapeutic drug level monitoring; Neurogenic pain; Neuropathic pain; Thoracic facet syndrome (T8-10); Lumbar facet syndrome (Bilateral); Cervical facet syndrome (right side); Cervical spondylosis; Lumbar spondylosis; Chronic pain of left upper extremity; Chronic cervical radicular pain (left side); Chronic upper back pain; History of thoracic spine surgery (S/P T9-10 IVD spacer); Failed back surgical syndrome; Musculoskeletal pain; Myofascial pain; Chronic left lower extremity pain; Chronic radicular lumbar  pain (left L4 dermatomal pain); Coronary artery disease; History of MI (myocardial infarction) (May 2012); Chronic anticoagulation (on [place]); History of Meniere's disease; Sleep apnea; Type 2 diabetes mellitus with hyperglycemia ([place]); Dysphagia; Encounter for interrogation of neurostimulator; Status post insertion of spinal cord stimulator; Unstable angina ([place]); Controlled type 2 diabetes mellitus without complication ([place]); Cervical pain; and Arthralgia of shoulder on his problem list.. His primarily concern today is the Back Pain   Pain Assessment: Self-Reported Pain Score: 2  Reported level is compatible with observation Pain Type: Chronic pain Pain Location: Back Pain Orientation: Lower Pain Descriptors / Indicators: Aching, Sharp Pain Frequency: Constant  The patient comes into the clinics today for pharmacological management of his chronic pain. The patient is pending a cardiac catheterization tomorrow.  Date of Last Visit: 03/22/15 Service Provided on Last Visit: Med Refill  Controlled Substance Pharmacotherapy Assessment & REMS (Risk Evaluation and Mitigation Strategy)  Analgesic: Oxycodone IR 10 mg every 6 hours (40 mg/day) Pill Count: Oxycodone pill count 10 mg #44/120 Filled 05-25-15 MME/day: 60 mg/day Pharmacokinetics: Onset of action (Liberation/Absorption): Within expected pharmacological parameters Time to Peak effect (Distribution): Timing and results are as within normal expected parameters Duration of action (Metabolism/Excretion): Within normal limits for medication Pharmacodynamics: Analgesic Effect: More than 50% Activity Facilitation: Medication(s) allow patient to sit, stand, walk, and do the basic ADLs Perceived Effectiveness: Described as relatively effective, allowing for increase in activities of daily living (ADL) Side-effects or Adverse reactions: None reported Monitoring: [place] PMP: Online review of the past 32-monthperiod conducted. Compliant with  practice rules and regulations UDS Results/interpretation: The patient's last UDS was done on 03/22/2015 and it came back within normal limits with no unexpected results except for the presence of some alcohol which was attributed to his diabetes since there was also glucose present in the sample. Medication Assessment Form: Reviewed.  Patient indicates being compliant with therapy Treatment compliance: Compliant Risk Assessment: Aberrant Behavior: None observed today Substance Use Disorder (SUD) Risk Level: No change since last visit Risk of opioid abuse or dependence: 0.7-3.0% with doses ? 36 MME/day and 6.1-26% with doses ? 120 MME/day. Opioid Risk Tool (ORT) Score: Total Score: 0 Low Risk for SUD (Score <3) Depression Scale Score: PHQ-2: PHQ-2 Total Score: 0 No depression (0) PHQ-9: PHQ-9 Total Score: 0 No depression (0-4)  Pharmacologic Plan: No change in therapy, at this time  Laboratory Chemistry  Inflammation Markers Lab Results  Component Value Date   ESRSEDRATE 9 03/22/2015   CRP 1.4* 03/22/2015    Renal Function Lab Results  Component Value Date   BUN 32* 03/22/2015   CREATININE 1.47* 03/22/2015   GFRAA 58* 03/22/2015   GFRNONAA 50* 03/22/2015    Hepatic Function Lab Results  Component Value Date   AST 27 03/22/2015   ALT 34 03/22/2015   ALBUMIN 4.1 03/22/2015    Electrolytes Lab Results  Component Value Date   NA 136 03/22/2015   K 5.2* 03/22/2015   CL 104 03/22/2015   CALCIUM 9.8 03/22/2015   MG 1.7 03/22/2015    Pain Modulating Vitamins No results found for: VD25OH, VD125OH2TOT, NT6144RX5, QM0867YP9, VITAMINB12  Coagulation Parameters Lab Results  Component Value Date   INR 1.0 02/23/2014   LABPROT 12.9 02/23/2014    Note: I personally reviewed the above data. Results shared with patient.  Meds  The patient has a current medication list which includes the following prescription(s): accu-chek aviva plus, alprazolam, aspirin ec, cartia xt,  cetirizine, cyclobenzaprine, diazepam, enalapril, esomeprazole, ezetimibe, gabapentin, humalog mix 75/25 kwikpen, insulin glargine, insulin lispro, insulin lispro prot & lispro, pen needles 31gx5/16", insulin syringe-needle u-100, magnesium oxide, meclizine, metformin, metoprolol succinate, montelukast, niacin, nitroglycerin, fish oil, oxycodone hcl, oxycodone hcl, oxycodone hcl, prasugrel, ranitidine, rosuvastatin, triamterene-hydrochlorothiazide, and triamterene-hydrochlorothiazide.  Current Outpatient Prescriptions on File Prior to Visit  Medication Sig  . ACCU-CHEK AVIVA PLUS test strip   . ALPRAZolam (XANAX) 0.5 MG tablet Take 0.5 mg by mouth at bedtime as needed for anxiety.  Marland Kitchen aspirin EC 81 MG tablet Take 81 mg by mouth daily.  . cetirizine (ZYRTEC) 10 MG tablet Take 10 mg by mouth daily.  . diazepam (VALIUM) 5 MG tablet Take 5 mg by mouth every 8 (eight) hours as needed.   . enalapril (VASOTEC) 10 MG tablet TAKE ONE TABLET BY MOUTH TWICE DAILY  . esomeprazole (NEXIUM) 40 MG capsule Take 40 mg by mouth 2 (two) times daily.  Marland Kitchen ezetimibe (ZETIA) 10 MG tablet Take 10 mg by mouth daily.  Marland Kitchen gabapentin (NEURONTIN) 800 MG tablet Take 2 tablets (1,600 mg total) by mouth 2 (two) times daily. (Patient taking differently: Take 1,600 mg by mouth 3 (three) times daily. )  . HUMALOG MIX 75/25 KWIKPEN (75-25) 100 UNIT/ML Kwikpen Inject 60 Units into the skin 2 (two) times daily.   . insulin glargine (LANTUS) 100 UNIT/ML injection Inject 100 Units into the skin at bedtime.   . insulin lispro (HUMALOG) 100 UNIT/ML KiwkPen Take 30 units before lunch.  . Insulin Pen Needle (PEN NEEDLES 31GX5/16") 31G X 8 MM MISC Use as directed. 3 times daily  . Insulin Syringe-Needle U-100 (BD INSULIN SYRINGE ULTRAFINE) 31G X 5/16" 0.5 ML MISC Use one daily  . meclizine (ANTIVERT) 25 MG tablet Take 25 mg by mouth 3 (three) times daily.  . metFORMIN (GLUCOPHAGE) 1000 MG tablet Take 1,000 mg by mouth 2 (two)  times daily with a  meal.  . metoprolol succinate (TOPROL-XL) 50 MG 24 hr tablet Take 100 mg by mouth daily. Take with or immediately following a meal.  . montelukast (SINGULAIR) 10 MG tablet Take 10 mg by mouth at bedtime.   . niacin (NIASPAN) 500 MG CR tablet Take 500 mg by mouth at bedtime.  . nitroGLYCERIN (NITROSTAT) 0.4 MG SL tablet Place 0.4 mg under the tongue every 5 (five) minutes as needed for chest pain (chest pain).  . Omega-3 Fatty Acids (FISH OIL) 1200 MG CAPS Take 1,200 mg by mouth 2 (two) times daily.  . prasugrel (EFFIENT) 10 MG TABS Take 10 mg by mouth daily.  . ranitidine (ZANTAC) 150 MG tablet Take 150 mg by mouth.  . rosuvastatin (CRESTOR) 10 MG tablet Take 20 mg by mouth.  . triamterene-hydrochlorothiazide (MAXZIDE) 75-50 MG tablet Take 1 tablet by mouth daily.    No current facility-administered medications on file prior to visit.    ROS  Constitutional: Afebrile, no chills, well hydrated and well nourished Gastrointestinal: No upper or lower GI bleeding, no nausea, no vomiting and no acute GI distress Musculoskeletal: No acute joint swelling or redness, no acute loss of range of motion and no acute onset weakness Neurological: Denies any acute onset apraxia, no episodes of paralysis, no acute loss of coordination, no acute loss of consciousness and no acute onset aphasia, dysarthria, agnosia, or amnesia  Allergies  Mr. Pelley has No Known Allergies.  [place]  Medical:  Mr. Gains  has a past medical history of Myocardial infarction ([place]); Coronary artery disease; Hypertension; Meniere's disease; Bronchitis; Diabetes mellitus without complication ([place]); GERD (gastroesophageal reflux disease); Neuromuscular disorder ([place]); Chronic back pain; Sleep apnea; Hyperlipidemia; DDD (degenerative disc disease), cervical; Short-segment Barrett's esophagus; Dysphagia; Psychosis; COPD (chronic obstructive pulmonary disease) ([place]); Hypercholesteremia; History of thoracic spine surgery (S/P T9-10 IVD  spacer) (12/21/2014); and History of Meniere's disease (12/21/2014). Family: family history includes Cancer in his sister; Diabetes in his mother; Heart disease in his father, maternal aunt, maternal uncle, and mother. Surgical:  has past surgical history that includes Cardiovascular stress test; Cardiac catheterization; Coronary angioplasty; Labrinthectomy; mastoid shunt; Appendectomy; Back surgery; Shoulder arthroscopy with subacromial decompression (Left, 04/06/2012); ARTHRODESIS ANTERIOR ANTERIOR CERVICLE SPINE (01/04/2013); Colonoscopy with propofol (N/A, 09/19/2014); Esophagogastroduodenoscopy (N/A, 09/19/2014); Savory dilation (N/A, 09/19/2014); and Spinal cord stimulator implant (Right). Tobacco:  reports that he has never smoked. He has never used smokeless tobacco. Alcohol:  reports that he does not drink alcohol. Drug:  reports that he does not use illicit drugs.  Physical Examination  Constitutional Vitals: Blood pressure 129/96, pulse 95, temperature 98.8 F (37.1 C), resp. rate 18, height 5' 9"  (1.753 m), weight 227 lb (102.967 kg), SpO2 99 %. Calculated BMI: Body mass index is 33.51 kg/(m^2).       General appearance: Alert, cooperative, oriented x 3, in no acute distress, well nourished, well developed, well hydrated Eyes: PERLA Respiratory: No evidence respiratory distress, no audible rales or ronchi and no use of accessory muscles of respiration Psych: Alert, oriented to person, oriented to place and oriented to time  Cervical Spine Exam  Inspection: Normal anatomy, no anomalies observed Cervical Lordosis: Normal Alignment: Symetrical Functional ROM: Within functional limits [facility]) AROM: WFL Sensory: No sensory anomalies reported or detected  Upper Extremity Exam    Right  Left  Inspection: No gross anomalies detected  Inspection: No gross anomalies detected  Functional ROM: Adequate  Functional ROM: Adequate  AROM: Adequate  AROM: Adequate  Sensory:  No sensory anomalies  reported or detected  Sensory: No sensory anomalies reported or detected  Motor: Unremarkable  Motor: Unremarkable  Vascular: Normal skin color, temperature, and hair growth. No peripheral edema or cyanosis  Vascular: Normal skin color, temperature, and hair growth. No peripheral edema or cyanosis   Thoracic Spine  Inspection: No gross anomalies detected Alignment: Symetrical Functional ROM: Within functional limits [facility]) AROM: Adequate Palpation: WNL  Lumbar Spine  Inspection: No gross anomalies detected Alignment: Symetrical Functional ROM: Within functional limits (WFL) AROM: Adequate Sensory: No sensory anomalies reported or detected Palpation: WNL Provocative Tests: Lumbar Hyperextension and rotation test: deferred Patrick's Maneuver: deferred  Gait Assessment  Gait: WNL  Lower Extremities    Right  Left  Inspection: No gross anomalies detected  Inspection: No gross anomalies detected  Functional ROM: Within functional limits [facility])  Functional ROM: Within functional limits (WFL)  AROM: Adequate  AROM: Adequate  Sensory: No sensory anomalies reported or detected  Sensory: No sensory anomalies reported or detected  Motor: Unremarkable  Motor: Unremarkable  Toe walk (S1): WNL  Toe walk (S1): WNL  Heal walk (L5): WNL  Heal walk (L5): WNL   Assessment & Plan  Primary Diagnosis & Pertinent Problem List: The primary encounter diagnosis was Chronic pain. Diagnoses of Encounter for therapeutic drug level monitoring, Long term current use of opiate analgesic, Musculoskeletal pain, and Opiate use (60 MME/Day) were also pertinent to this visit.  Visit Diagnosis: 1. Chronic pain   2. Encounter for therapeutic drug level monitoring   3. Long term current use of opiate analgesic   4. Musculoskeletal pain   5. Opiate use (60 MME/Day)     Problems updated and reviewed during this visit: Problem  Diabetic Polyneuropathy ([facility])  Opiate use (60 MME/Day)   Oxycodone IR 10 mg every 6  hours (40 mg/day)   Controlled Type 2 Diabetes Mellitus Without Complication ([facility])  Type 2 Diabetes Mellitus With Hyperglycemia ([facility])  Can't Get Food Down  Cervical Pain  Arthralgia of Shoulder    Problem-specific Plan(s): No problem-specific assessment & plan notes found for this encounter.  No new assessment & plan notes have been filed under this hospital service since the last note was generated. Service: Pain Management   Plan of Care   Problem List Items Addressed This Visit      High   Chronic pain - Primary (Chronic)   Relevant Medications   Oxycodone HCl 10 MG TABS   Oxycodone HCl 10 MG TABS   Oxycodone HCl 10 MG TABS   cyclobenzaprine (FLEXERIL) 10 MG tablet   Musculoskeletal pain (Chronic)   Relevant Medications   cyclobenzaprine (FLEXERIL) 10 MG tablet     Medium   Encounter for therapeutic drug level monitoring   Long term current use of opiate analgesic (Chronic)   Relevant Orders   ToxASSURE Select 13 (MW), Urine   Opiate use (60 MME/Day) (Chronic)       Pharmacotherapy (Medications Ordered): Meds ordered this encounter  Medications  . Oxycodone HCl 10 MG TABS    Sig: Take 1 tablet (10 mg total) by mouth every 6 (six) hours as needed.    Dispense:  120 tablet    Refill:  0    Do not place this medication, or any other prescription from our practice, on "Automatic Refill". Patient may have prescription filled one day early if pharmacy is closed on scheduled refill date. Do not fill until: 06/20/15 To last until: 07/20/15  . Oxycodone HCl 10  MG TABS    Sig: Take 1 tablet (10 mg total) by mouth every 6 (six) hours as needed.    Dispense:  120 tablet    Refill:  0    Do not place this medication, or any other prescription from our practice, on "Automatic Refill". Patient may have prescription filled one day early if pharmacy is closed on scheduled refill date. Do not fill until: 07/20/15 To last until: 08/19/15  . Oxycodone HCl 10 MG TABS    Sig:  Take 1 tablet (10 mg total) by mouth every 6 (six) hours as needed.    Dispense:  120 tablet    Refill:  0    Do not place this medication, or any other prescription from our practice, on "Automatic Refill". Patient may have prescription filled one day early if pharmacy is closed on scheduled refill date. Do not fill until: 08/19/15 To last until: 07/311/17  . DISCONTD: cyclobenzaprine (FLEXERIL) 10 MG tablet    Sig: Take 1 tablet (10 mg total) by mouth every 8 (eight) hours as needed for muscle spasms.    Dispense:  90 tablet    Refill:  2    Do not place this medication, or any other prescription from our practice, on "Automatic Refill". Patient may have prescription filled one day early if pharmacy is closed on scheduled refill date.  . cyclobenzaprine (FLEXERIL) 10 MG tablet    Sig: Take 1 tablet (10 mg total) by mouth every 8 (eight) hours as needed for muscle spasms.    Dispense:  270 tablet    Refill:  0    3 Month refill.    Lab-work & Procedure Ordered: Orders Placed This Encounter  Procedures  . ToxASSURE Select 13 (MW), Urine    Imaging Ordered: None  Interventional Therapies: Scheduled:  None at this time.    Considering:  None at this time.    PRN Procedures:  None at this time.    Referral(s) or Consult(s): None at this time.  New Prescriptions   No medications on file    Medications administered during this visit: Mr. Payer had no medications administered during this visit.  No future appointments.  Primary Care Physician: Madelyn Brunner, MD Location: [facility] Note by: Kathlen Brunswick Dossie Arbour, M.D, DABA, DABAPM, DABPM, DABIPP, FIPP  Pain Score Disclaimer: We use the NRS-11 scale. This is a self-reported, subjective measurement of pain severity with only modest accuracy. It is used primarily to identify changes within a particular patient. It must be understood that outpatient pain scales are significantly less accurate  that those used for research, where they can be applied under ideal controlled circumstances with minimal exposure to variables. In reality, the score is likely to be a combination of pain intensity and pain affect, where pain affect describes the degree of emotional arousal or changes in action readiness caused by the sensory experience of pain. Factors such as social and work situation, setting, emotional state, anxiety levels, expectation, and prior pain experience may influence pain perception and show large inter-individual differences that may also be affected by time variables.

## 2015-06-15 ENCOUNTER — Encounter
Admission: RE | Disposition: A | Discharge status: Home / Self Care | Payer: Self-pay | Source: Ambulatory Visit | Attending: Cardiology

## 2015-06-15 ENCOUNTER — Encounter: Payer: Self-pay | Admitting: *Deleted

## 2015-06-15 ENCOUNTER — Ambulatory Visit
Admission: RE | Admit: 2015-06-15 | Discharge: 2015-06-16 | Disposition: A | Discharge status: Home / Self Care | Payer: Managed Care, Other (non HMO) | Source: Ambulatory Visit | Attending: Cardiology | Admitting: Cardiology

## 2015-06-15 DIAGNOSIS — Z79899 Other long term (current) drug therapy: Secondary | ICD-10-CM | POA: Insufficient documentation

## 2015-06-15 DIAGNOSIS — Z86010 Personal history of colonic polyps: Secondary | ICD-10-CM | POA: Insufficient documentation

## 2015-06-15 DIAGNOSIS — I471 Supraventricular tachycardia: Secondary | ICD-10-CM | POA: Insufficient documentation

## 2015-06-15 DIAGNOSIS — I340 Nonrheumatic mitral (valve) insufficiency: Secondary | ICD-10-CM | POA: Diagnosis not present

## 2015-06-15 DIAGNOSIS — I25119 Atherosclerotic heart disease of native coronary artery with unspecified angina pectoris: Secondary | ICD-10-CM | POA: Insufficient documentation

## 2015-06-15 DIAGNOSIS — M5416 Radiculopathy, lumbar region: Secondary | ICD-10-CM | POA: Diagnosis not present

## 2015-06-15 DIAGNOSIS — K2270 Barrett's esophagus without dysplasia: Secondary | ICD-10-CM | POA: Diagnosis not present

## 2015-06-15 DIAGNOSIS — Z981 Arthrodesis status: Secondary | ICD-10-CM | POA: Insufficient documentation

## 2015-06-15 DIAGNOSIS — H918X3 Other specified hearing loss, bilateral: Secondary | ICD-10-CM | POA: Insufficient documentation

## 2015-06-15 DIAGNOSIS — Z7982 Long term (current) use of aspirin: Secondary | ICD-10-CM | POA: Diagnosis not present

## 2015-06-15 DIAGNOSIS — I10 Essential (primary) hypertension: Secondary | ICD-10-CM | POA: Insufficient documentation

## 2015-06-15 DIAGNOSIS — E7800 Pure hypercholesterolemia, unspecified: Secondary | ICD-10-CM | POA: Insufficient documentation

## 2015-06-15 DIAGNOSIS — E669 Obesity, unspecified: Secondary | ICD-10-CM | POA: Diagnosis not present

## 2015-06-15 DIAGNOSIS — Z955 Presence of coronary angioplasty implant and graft: Secondary | ICD-10-CM | POA: Diagnosis not present

## 2015-06-15 DIAGNOSIS — R1310 Dysphagia, unspecified: Secondary | ICD-10-CM | POA: Insufficient documentation

## 2015-06-15 DIAGNOSIS — Z794 Long term (current) use of insulin: Secondary | ICD-10-CM | POA: Diagnosis not present

## 2015-06-15 DIAGNOSIS — Z833 Family history of diabetes mellitus: Secondary | ICD-10-CM | POA: Diagnosis not present

## 2015-06-15 DIAGNOSIS — I200 Unstable angina: Secondary | ICD-10-CM | POA: Diagnosis present

## 2015-06-15 DIAGNOSIS — H8109 Meniere's disease, unspecified ear: Secondary | ICD-10-CM | POA: Insufficient documentation

## 2015-06-15 DIAGNOSIS — G4730 Sleep apnea, unspecified: Secondary | ICD-10-CM | POA: Diagnosis not present

## 2015-06-15 DIAGNOSIS — K219 Gastro-esophageal reflux disease without esophagitis: Secondary | ICD-10-CM | POA: Insufficient documentation

## 2015-06-15 DIAGNOSIS — E1142 Type 2 diabetes mellitus with diabetic polyneuropathy: Secondary | ICD-10-CM | POA: Insufficient documentation

## 2015-06-15 DIAGNOSIS — Z8249 Family history of ischemic heart disease and other diseases of the circulatory system: Secondary | ICD-10-CM | POA: Diagnosis not present

## 2015-06-15 DIAGNOSIS — I252 Old myocardial infarction: Secondary | ICD-10-CM | POA: Insufficient documentation

## 2015-06-15 DIAGNOSIS — R079 Chest pain, unspecified: Secondary | ICD-10-CM | POA: Diagnosis present

## 2015-06-15 DIAGNOSIS — I2510 Atherosclerotic heart disease of native coronary artery without angina pectoris: Secondary | ICD-10-CM | POA: Diagnosis present

## 2015-06-15 HISTORY — PX: CARDIAC CATHETERIZATION: SHX172

## 2015-06-15 LAB — GLUCOSE, CAPILLARY
GLUCOSE-CAPILLARY: 139 mg/dL — AB (ref 65–99)
GLUCOSE-CAPILLARY: 213 mg/dL — AB (ref 65–99)

## 2015-06-15 SURGERY — LEFT HEART CATH AND CORONARY ANGIOGRAPHY
Anesthesia: Moderate Sedation

## 2015-06-15 MED ORDER — METFORMIN HCL 500 MG PO TABS
1000.0000 mg | ORAL_TABLET | Freq: Two times a day (BID) | ORAL | Status: DC
  Administered 2015-06-15 – 2015-06-16 (×2): 1000 mg via ORAL
  Filled 2015-06-15 (×2): qty 2

## 2015-06-15 MED ORDER — ASPIRIN 81 MG PO CHEW
CHEWABLE_TABLET | ORAL | Status: AC
  Filled 2015-06-15: qty 3

## 2015-06-15 MED ORDER — SODIUM CHLORIDE 0.9% FLUSH: 3.0000 mL | Freq: Two times a day (BID) | INTRAVENOUS | Status: DC

## 2015-06-15 MED ORDER — FENTANYL CITRATE (PF) 100 MCG/2ML IJ SOLN
INTRAMUSCULAR | Status: DC | PRN
  Administered 2015-06-15: 25 ug via INTRAVENOUS

## 2015-06-15 MED ORDER — ASPIRIN EC 325 MG PO TBEC
325.0000 mg | DELAYED_RELEASE_TABLET | Freq: Every day | ORAL | Status: DC
  Administered 2015-06-16: 325 mg via ORAL
  Filled 2015-06-15: qty 1

## 2015-06-15 MED ORDER — DEXTROSE 5 % IV SOLN
INTRAVENOUS | Status: DC
  Filled 2015-06-15: qty 1000

## 2015-06-15 MED ORDER — SODIUM CHLORIDE 0.9 % WEIGHT BASED INFUSION: 3.0000 mL/kg/h | INTRAVENOUS | Status: DC

## 2015-06-15 MED ORDER — MIDAZOLAM HCL 2 MG/2ML IJ SOLN
INTRAMUSCULAR | Status: AC
  Filled 2015-06-15: qty 2

## 2015-06-15 MED ORDER — SODIUM CHLORIDE 0.9 % IV SOLN: 250.0000 mL | INTRAVENOUS | Status: DC | PRN

## 2015-06-15 MED ORDER — SODIUM CHLORIDE 0.9% FLUSH
3.0000 mL | Freq: Two times a day (BID) | INTRAVENOUS | Status: DC
  Administered 2015-06-15 (×2): 3 mL via INTRAVENOUS

## 2015-06-15 MED ORDER — CYCLOBENZAPRINE HCL 10 MG PO TABS: 10.0000 mg | ORAL_TABLET | Freq: Three times a day (TID) | ORAL | Status: DC | PRN

## 2015-06-15 MED ORDER — ENALAPRIL MALEATE 10 MG PO TABS
10.0000 mg | ORAL_TABLET | Freq: Two times a day (BID) | ORAL | Status: DC
  Administered 2015-06-15 – 2015-06-16 (×2): 10 mg via ORAL
  Filled 2015-06-15 (×2): qty 1

## 2015-06-15 MED ORDER — SODIUM CHLORIDE 0.9% FLUSH: 3.0000 mL | INTRAVENOUS | Status: DC | PRN

## 2015-06-15 MED ORDER — ACETAMINOPHEN 325 MG PO TABS: 650.0000 mg | ORAL_TABLET | ORAL | Status: DC | PRN

## 2015-06-15 MED ORDER — SODIUM CHLORIDE 0.9 % IV SOLN
250.0000 mg | INTRAVENOUS | Status: DC | PRN
  Administered 2015-06-15: 1.75 mg/kg/h via INTRAVENOUS

## 2015-06-15 MED ORDER — OXYCODONE HCL 5 MG PO TABS
5.0000 mg | ORAL_TABLET | ORAL | Status: DC | PRN
  Administered 2015-06-15 – 2015-06-16 (×4): 5 mg via ORAL
  Filled 2015-06-15 (×3): qty 1

## 2015-06-15 MED ORDER — ASPIRIN 81 MG PO CHEW
CHEWABLE_TABLET | ORAL | Status: AC
  Filled 2015-06-15: qty 1

## 2015-06-15 MED ORDER — MIDAZOLAM HCL 2 MG/2ML IJ SOLN
INTRAMUSCULAR | Status: DC | PRN
  Administered 2015-06-15: 1 mg via INTRAVENOUS

## 2015-06-15 MED ORDER — DILTIAZEM HCL ER COATED BEADS 240 MG PO CP24
240.0000 mg | ORAL_CAPSULE | Freq: Every day | ORAL | Status: DC
  Administered 2015-06-16: 240 mg via ORAL
  Filled 2015-06-15: qty 1

## 2015-06-15 MED ORDER — BIVALIRUDIN BOLUS VIA INFUSION - CUPID
INTRAVENOUS | Status: DC | PRN
  Administered 2015-06-15: 77.25 mg via INTRAVENOUS

## 2015-06-15 MED ORDER — PRASUGREL HCL 10 MG PO TABS
ORAL_TABLET | ORAL | Status: AC
  Filled 2015-06-15: qty 2

## 2015-06-15 MED ORDER — BIVALIRUDIN 250 MG IV SOLR
INTRAVENOUS | Status: AC
  Filled 2015-06-15: qty 250

## 2015-06-15 MED ORDER — TRIAMTERENE-HCTZ 37.5-25 MG PO TABS
2.0000 | ORAL_TABLET | Freq: Every day | ORAL | Status: DC
  Administered 2015-06-16: 2 via ORAL
  Filled 2015-06-15: qty 2

## 2015-06-15 MED ORDER — SODIUM BICARBONATE 8.4 % IV SOLN: INTRAVENOUS | Status: DC

## 2015-06-15 MED ORDER — ROSUVASTATIN CALCIUM 20 MG PO TABS
20.0000 mg | ORAL_TABLET | Freq: Every day | ORAL | Status: DC
  Administered 2015-06-15: 20 mg via ORAL
  Filled 2015-06-15: qty 1

## 2015-06-15 MED ORDER — ASPIRIN 81 MG PO CHEW: 81.0000 mg | CHEWABLE_TABLET | ORAL | Status: DC

## 2015-06-15 MED ORDER — SODIUM CHLORIDE 0.9 % WEIGHT BASED INFUSION: 1.0000 mL/kg/h | INTRAVENOUS | Status: DC

## 2015-06-15 MED ORDER — FENTANYL CITRATE (PF) 100 MCG/2ML IJ SOLN
INTRAMUSCULAR | Status: AC
  Filled 2015-06-15: qty 2

## 2015-06-15 MED ORDER — PRASUGREL HCL 10 MG PO TABS
10.0000 mg | ORAL_TABLET | Freq: Every day | ORAL | Status: DC
  Administered 2015-06-16: 10 mg via ORAL
  Filled 2015-06-15 (×3): qty 1

## 2015-06-15 MED ORDER — METOPROLOL SUCCINATE ER 50 MG PO TB24
50.0000 mg | ORAL_TABLET | Freq: Every day | ORAL | Status: DC
  Administered 2015-06-16: 50 mg via ORAL
  Filled 2015-06-15: qty 1

## 2015-06-15 MED ORDER — MECLIZINE HCL 25 MG PO TABS
25.0000 mg | ORAL_TABLET | Freq: Two times a day (BID) | ORAL | Status: DC | PRN
  Filled 2015-06-15: qty 1

## 2015-06-15 MED ORDER — MONTELUKAST SODIUM 10 MG PO TABS
10.0000 mg | ORAL_TABLET | Freq: Every day | ORAL | Status: DC
  Administered 2015-06-15: 10 mg via ORAL
  Filled 2015-06-15: qty 1

## 2015-06-15 MED ORDER — SODIUM CHLORIDE 0.9 % IV SOLN
INTRAVENOUS | Status: DC
  Administered 2015-06-16: 03:00:00 via INTRAVENOUS

## 2015-06-15 MED ORDER — MAGNESIUM OXIDE 400 (241.3 MG) MG PO TABS
400.0000 mg | ORAL_TABLET | Freq: Every day | ORAL | Status: DC
Start: 2015-06-15 — End: 2015-06-16
  Administered 2015-06-16: 400 mg via ORAL
  Filled 2015-06-15: qty 1

## 2015-06-15 MED ORDER — ONDANSETRON HCL 4 MG/2ML IJ SOLN: 4.0000 mg | Freq: Four times a day (QID) | INTRAMUSCULAR | Status: DC | PRN

## 2015-06-15 MED ORDER — EZETIMIBE 10 MG PO TABS
10.0000 mg | ORAL_TABLET | Freq: Every day | ORAL | Status: DC
  Administered 2015-06-16: 10 mg via ORAL
  Filled 2015-06-15: qty 1

## 2015-06-15 MED ORDER — NITROGLYCERIN 1 MG/10 ML FOR IR/CATH LAB
INTRA_ARTERIAL | Status: DC | PRN
  Administered 2015-06-15: 200 ug via INTRACORONARY

## 2015-06-15 MED ORDER — GABAPENTIN 400 MG PO CAPS
800.0000 mg | ORAL_CAPSULE | Freq: Three times a day (TID) | ORAL | Status: DC
  Administered 2015-06-15 – 2015-06-16 (×3): 800 mg via ORAL
  Filled 2015-06-15 (×3): qty 2

## 2015-06-15 MED ORDER — HEPARIN (PORCINE) IN NACL 2-0.9 UNIT/ML-% IJ SOLN
INTRAMUSCULAR | Status: AC
  Filled 2015-06-15: qty 1000

## 2015-06-15 MED ORDER — INSULIN GLARGINE 100 UNIT/ML ~~LOC~~ SOLN
100.0000 [IU] | Freq: Every day | SUBCUTANEOUS | Status: DC
  Administered 2015-06-15: 100 [IU] via SUBCUTANEOUS
  Filled 2015-06-15 (×2): qty 1

## 2015-06-15 MED ORDER — OXYCODONE-ACETAMINOPHEN 5-325 MG PO TABS
2.0000 | ORAL_TABLET | ORAL | Status: DC | PRN
  Administered 2015-06-15 – 2015-06-16 (×3): 2 via ORAL
  Filled 2015-06-15 (×4): qty 2

## 2015-06-15 MED ORDER — ASPIRIN 81 MG PO CHEW
CHEWABLE_TABLET | ORAL | Status: DC | PRN
  Administered 2015-06-15: 324 mg via ORAL

## 2015-06-15 MED ORDER — FAMOTIDINE 20 MG PO TABS
10.0000 mg | ORAL_TABLET | Freq: Every day | ORAL | Status: DC
  Administered 2015-06-16: 10 mg via ORAL
  Filled 2015-06-15: qty 1

## 2015-06-15 MED ORDER — NIACIN ER 500 MG PO CPCR
500.0000 mg | ORAL_CAPSULE | Freq: Every day | ORAL | Status: DC
  Filled 2015-06-15 (×2): qty 1

## 2015-06-15 MED ORDER — ALPRAZOLAM 0.25 MG PO TABS: 0.5000 mg | ORAL_TABLET | Freq: Two times a day (BID) | ORAL | Status: DC | PRN

## 2015-06-15 MED ORDER — NITROGLYCERIN 5 MG/ML IV SOLN
INTRAVENOUS | Status: AC
  Filled 2015-06-15: qty 10

## 2015-06-15 MED ORDER — SODIUM BICARBONATE BOLUS VIA INFUSION: INTRAVENOUS | Status: DC

## 2015-06-15 MED ORDER — PRASUGREL HCL 10 MG PO TABS
ORAL_TABLET | ORAL | Status: DC | PRN
  Administered 2015-06-15: 20 mg via ORAL

## 2015-06-15 MED ORDER — SODIUM CHLORIDE 0.9 % WEIGHT BASED INFUSION: 3.0000 mL/kg/h | INTRAVENOUS | Status: AC

## 2015-06-15 MED ORDER — INSULIN ASPART PROT & ASPART (70-30 MIX) 100 UNIT/ML ~~LOC~~ SUSP
60.0000 [IU] | Freq: Two times a day (BID) | SUBCUTANEOUS | Status: DC
  Administered 2015-06-15 – 2015-06-16 (×2): 60 [IU] via SUBCUTANEOUS
  Filled 2015-06-15 (×2): qty 60

## 2015-06-15 SURGICAL SUPPLY — 17 items
BALLN ~~LOC~~ TREK RX 3.5X15 (BALLOONS) ×3
BALLOON ~~LOC~~ TREK RX 3.5X15 (BALLOONS) ×1 IMPLANT
CATH INFINITI 5FR ANG PIGTAIL (CATHETERS) ×3 IMPLANT
CATH INFINITI 5FR JL4 (CATHETERS) ×3 IMPLANT
CATH INFINITI JR4 5F (CATHETERS) ×3 IMPLANT
CATH VISTA GUIDE 6FR JR4 SH (CATHETERS) ×3 IMPLANT
DEVICE CLOSURE MYNXGRIP 6/7F (Vascular Products) ×3 IMPLANT
DEVICE INFLAT 30 PLUS (MISCELLANEOUS) ×3 IMPLANT
KIT MANI 3VAL PERCEP (MISCELLANEOUS) ×3 IMPLANT
NEEDLE PERC 18GX7CM (NEEDLE) ×3 IMPLANT
PACK CARDIAC CATH (CUSTOM PROCEDURE TRAY) ×3 IMPLANT
SHEATH AVANTI 6FR X 11CM (SHEATH) ×3 IMPLANT
SHEATH PINNACLE 5F 10CM (SHEATH) ×3 IMPLANT
STENT XIENCE ALPINE RX 3.0X15 (Permanent Stent) ×3 IMPLANT
WIRE ASAHI PROWATER 180CM (WIRE) ×3 IMPLANT
WIRE EMERALD 3MM-J .035X150CM (WIRE) ×3 IMPLANT
WIRE EMERALD ST .035X150CM (WIRE) IMPLANT

## 2015-06-15 NOTE — OR Nursing (Signed)
Pt transferred to telemetry.  Right groin site stable.  No complications.  VSS

## 2015-06-15 NOTE — Progress Notes (Signed)
A &O. Room air. R groin shows no bleeding or hematoma. Wife at the bedside. Pt has no further concerns at this time.

## 2015-06-16 DIAGNOSIS — I25119 Atherosclerotic heart disease of native coronary artery with unspecified angina pectoris: Secondary | ICD-10-CM | POA: Diagnosis not present

## 2015-06-16 LAB — CBC
HCT: 38.5 % — ABNORMAL LOW (ref 40.0–52.0)
Hemoglobin: 13.1 g/dL (ref 13.0–18.0)
MCH: 27.8 pg (ref 26.0–34.0)
MCHC: 34.1 g/dL (ref 32.0–36.0)
MCV: 81.4 fL (ref 80.0–100.0)
PLATELETS: 180 10*3/uL (ref 150–440)
RBC: 4.73 MIL/uL (ref 4.40–5.90)
RDW: 14 % (ref 11.5–14.5)
WBC: 7.2 10*3/uL (ref 3.8–10.6)

## 2015-06-16 LAB — BASIC METABOLIC PANEL
Anion gap: 6 (ref 5–15)
BUN: 20 mg/dL (ref 6–20)
CHLORIDE: 107 mmol/L (ref 101–111)
CO2: 26 mmol/L (ref 22–32)
CREATININE: 1.24 mg/dL (ref 0.61–1.24)
Calcium: 9.5 mg/dL (ref 8.9–10.3)
Glucose, Bld: 170 mg/dL — ABNORMAL HIGH (ref 65–99)
Potassium: 4.7 mmol/L (ref 3.5–5.1)
SODIUM: 139 mmol/L (ref 135–145)

## 2015-06-16 LAB — GLUCOSE, CAPILLARY: Glucose-Capillary: 137 mg/dL — ABNORMAL HIGH (ref 65–99)

## 2015-06-16 NOTE — Progress Notes (Signed)
Patient alert and oriented, vss.  No complaints of pain.  Vascular discharge instructions given.  No hematoma at groin site.  Wife at bedside.  Tele and PIV removed.  PAtient to be escorted out of the hospital via wheelchair by volunteers.

## 2015-06-16 NOTE — Discharge Summary (Signed)
[facility] Cardiology Discharge Summary  Patient ID: Kenneth Osborn MRN: [id number] DOB/AGE: 1955/06/08 60 y.o.  Admit date: 06/15/2015 Discharge date: 06/16/2015  Primary Discharge Diagnosis: Ischemic chest pain I20.9 and Coronary artery disease with angina I25.119 Secondary Discharge Diagnosis high blood pressure and high cholesterol  Significant Diagnostic Studies: Cardiac cath with left ventricular angiogram and selective coronary injection as well as PCI and stent placement of right coronary artery.  Hospital Course: The patient was admitted to specials for cardiac cath with selective coronary angiogram after full consent, risk and benefits explained, and time out called with all approprate details voiced and discussed. The patient has had progressive canadian class 4 angina consistent with ischemic chest pain and or anginal equivalent with coronary artery risk factors including high blood pressure and high cholesterol. The procedure was performed without complication and it revealed normal left ventricular function with ejection fraction of 55%.  It was found that the patient had severe 1 vessel coronary atherosclerosis with significant right coronary artery stenosis requiring further intervention. Therefore, the patient had a PCI and drug eluding stent placed without complication. The patient has been ambulating without further significant symptoms and has reached his maximal hospital benefit and will be discharged to home in good condition.  Cardiac rehabilitation has been discussed and recommended. Medication management of cardiovascular risk factors will be given post discharge and modified as an outpatient.   Discharge Exam: Blood pressure 125/73, pulse 90, temperature 97.5 F (36.4 C), temperature source Oral, resp. rate 18, height 5' 9"  (1.753 m), weight 227 lb (102.967 kg), SpO2 98 %.  Constitutional: [place] oriented to person, place, and time. No distress.  HENT: No nasal  discharge.  Head: Normocephalic and atraumatic.  Eyes: Pupils are equal and round. No discharge.  Neck: Normal range of motion. Neck supple. No JVD present. No thyromegaly present.  Cardiovascular: Normal rate, regular rhythm, normal S1 S2, no gallop, no friction rub. No murmur Pulmonary/Chest: Effort normal, No stridor. No respiratory distress. no wheezes.  no rales.    Abdominal: Soft. Bowel sounds are normal.  no distension.  no tenderness. There is no rebound and no guarding.  Musculoskeletal: No edema, no cyanosis, normal pulses, no bleeding, Normal range of motion. no tenderness.  Neurological:  alert and oriented to person, place, and time. Coordination normal.  Skin: Skin is warm and dry. No rash noted. No erythema. No pallor.  Psychiatric:  normal mood and affect. behavior is normal.    Labs:   Lab Results  Component Value Date   WBC 7.2 06/16/2015   HGB 13.1 06/16/2015   HCT 38.5* 06/16/2015   MCV 81.4 06/16/2015   PLT 180 06/16/2015    Recent Labs Lab 06/16/15 0522  NA 139  K 4.7  CL 107  CO2 26  BUN 20  CREATININE 1.24  CALCIUM 9.5  GLUCOSE 170*    EKG: NSR without evidence of new changes  FOLLOW UP IN ONE TO TWO WEEKS    Medication List    ASK your doctor about these medications        ACCU-CHEK AVIVA PLUS test strip  Generic drug:  glucose blood     ALPRAZolam 0.5 MG tablet  Commonly known as:  XANAX  Take 0.5 mg by mouth at bedtime as needed for anxiety.     aspirin EC 81 MG tablet  Take 81 mg by mouth daily.     BD INSULIN SYRINGE ULTRAFINE 31G X 5/16" 0.5 ML Misc  Generic drug:  Insulin Syringe-Needle U-100  Use one daily     CARTIA XT 240 MG 24 hr capsule  Generic drug:  diltiazem  Take 240 mg by mouth daily.     cetirizine 10 MG tablet  Commonly known as:  ZYRTEC  Take 10 mg by mouth daily.     cyclobenzaprine 10 MG tablet  Commonly known as:  FLEXERIL  Take 1 tablet (10 mg total) by mouth every 8 (eight) hours as needed for  muscle spasms.     diazepam 5 MG tablet  Commonly known as:  VALIUM  Take 5 mg by mouth every 8 (eight) hours as needed.     enalapril 10 MG tablet  Commonly known as:  VASOTEC  TAKE ONE TABLET BY MOUTH TWICE DAILY     esomeprazole 40 MG capsule  Commonly known as:  NEXIUM  Take 40 mg by mouth 2 (two) times daily.     ezetimibe 10 MG tablet  Commonly known as:  ZETIA  Take 10 mg by mouth daily.     Fish Oil 1200 MG Caps  Take 1,200 mg by mouth 2 (two) times daily.     gabapentin 800 MG tablet  Commonly known as:  NEURONTIN  Take 2 tablets (1,600 mg total) by mouth 2 (two) times daily.     Insulin Lispro Prot & Lispro (50-50) 100 UNIT/ML Kwikpen  Commonly known as:  HUMALOG 50/50 MIX  Inject into the skin.     HUMALOG MIX 75/25 KWIKPEN (75-25) 100 UNIT/ML Kwikpen  Generic drug:  Insulin Lispro Prot & Lispro  Inject 60 Units into the skin 2 (two) times daily.     insulin glargine 100 UNIT/ML injection  Commonly known as:  LANTUS  Inject 100 Units into the skin at bedtime.     insulin lispro 100 UNIT/ML KiwkPen  Commonly known as:  HUMALOG  Take 30 units before lunch.     magnesium oxide 400 MG tablet  Commonly known as:  MAG-OX  Take 400 mg by mouth.     meclizine 25 MG tablet  Commonly known as:  ANTIVERT  Take 25 mg by mouth 3 (three) times daily.     metFORMIN 1000 MG tablet  Commonly known as:  GLUCOPHAGE  Take 1,000 mg by mouth 2 (two) times daily with a meal.     metoprolol succinate 50 MG 24 hr tablet  Commonly known as:  TOPROL-XL  Take 100 mg by mouth daily. Take with or immediately following a meal.     niacin 500 MG CR tablet  Commonly known as:  NIASPAN  Take 500 mg by mouth at bedtime.     nitroGLYCERIN 0.4 MG SL tablet  Commonly known as:  NITROSTAT  Place 0.4 mg under the tongue every 5 (five) minutes as needed for chest pain (chest pain).     Oxycodone HCl 10 MG Tabs  Take 1 tablet (10 mg total) by mouth every 6 (six) hours as needed.      Oxycodone HCl 10 MG Tabs  Take 1 tablet (10 mg total) by mouth every 6 (six) hours as needed.     Oxycodone HCl 10 MG Tabs  Take 1 tablet (10 mg total) by mouth every 6 (six) hours as needed.     PEN NEEDLES 31GX5/16" 31G X 8 MM Misc  Use as directed. 3 times daily     prasugrel 10 MG Tabs tablet  Commonly known as:  EFFIENT  Take 10 mg by mouth daily.  ranitidine 150 MG tablet  Commonly known as:  ZANTAC  Take 150 mg by mouth.     rosuvastatin 10 MG tablet  Commonly known as:  CRESTOR  Take 20 mg by mouth.     SINGULAIR 10 MG tablet  Generic drug:  montelukast  Take 10 mg by mouth at bedtime.     triamterene-hydrochlorothiazide 75-50 MG tablet  Commonly known as:  MAXZIDE  Take 1 tablet by mouth daily.     triamterene-hydrochlorothiazide 75-50 MG tablet  Commonly known as:  MAXZIDE  Take by mouth.         THE PATIENT  SHALL BRING ALL MEDICATIONS TO FOLLOW UP APPOINTMENT  Signed:  Corey Skains MD, [facility] [address] 06/16/2015, 8:13 AM

## 2015-06-16 NOTE — Discharge Instructions (Signed)
**  PLEASE REMEMBER TO BRING ALL OF YOUR MEDICATIONS TO EACH OF YOUR FOLLOW-UP OFFICE VISITS. ° °NO HEAVY LIFTING OR SEXUAL ACTIVITY X 7 DAYS. °NO DRIVING X 3-5 DAYS. °NO SOAKING BATHS, HOT TUBS, POOLS, ETC., X 7 DAYS. ° °Groin Site Care °Refer to this sheet in the next few weeks. These instructions provide you with information on caring for yourself after your procedure. Your caregiver may also give you more specific instructions. Your treatment has been planned according to current medical practices, but problems sometimes occur. Call your caregiver if you have any problems or questions after your procedure. °HOME CARE INSTRUCTIONS °· You may shower 24 hours after the procedure. Remove the bandage (dressing) and gently wash the site with plain soap and water. Gently pat the site dry.  °· Do not apply powder or lotion to the site.  °· Do not sit in a bathtub, swimming pool, or whirlpool for 5 to 7 days.  °· No bending, squatting, or lifting anything over 10 pounds (4.5 kg) as directed by your caregiver.  °· Inspect the site at least twice daily.  °· Do not drive home if you are discharged the same day of the procedure. Have someone else drive you.  ° °What to expect: °· Any bruising will usually fade within 1 to 2 weeks.  °· Blood that collects in the tissue (hematoma) may be painful to the touch. It should usually decrease in size and tenderness within 1 to 2 weeks.  °SEEK IMMEDIATE MEDICAL CARE IF: °· You have unusual pain at the groin site or down the affected leg.  °· You have redness, warmth, swelling, or pain at the groin site.  °· You have drainage (other than a small amount of blood on the dressing).  °· You have chills.  °· You have a fever or persistent symptoms for more than 72 hours.  °· You have a fever and your symptoms suddenly get worse.  °· Your leg becomes pale, cool, tingly, or numb.  °You have heavy bleeding from the site. Hold pressure on the site. . ° °

## 2015-06-16 NOTE — Care Management (Signed)
patient has been on Effient for 5 years.  no discharge needs

## 2015-06-20 LAB — TOXASSURE SELECT 13 (MW), URINE: PDF: 0

## 2015-07-04 ENCOUNTER — Other Ambulatory Visit: Payer: Self-pay | Admitting: Pain Medicine

## 2015-08-10 NOTE — Telephone Encounter (Signed)

## 2015-08-31 DIAGNOSIS — I471 Supraventricular tachycardia: Secondary | ICD-10-CM | POA: Insufficient documentation

## 2015-09-04 ENCOUNTER — Ambulatory Visit: Payer: Medicare Other | Admitting: Pain Medicine

## 2015-09-06 ENCOUNTER — Encounter: Payer: Self-pay | Admitting: Pain Medicine

## 2015-09-06 ENCOUNTER — Ambulatory Visit: Payer: Medicare Other | Attending: Pain Medicine | Admitting: Pain Medicine

## 2015-09-06 VITALS — BP 134/82 | HR 102 | Temp 98.2°F | Resp 18 | Ht 69.0 in | Wt 220.0 lb

## 2015-09-06 DIAGNOSIS — M545 Low back pain: Secondary | ICD-10-CM

## 2015-09-06 DIAGNOSIS — K2270 Barrett's esophagus without dysplasia: Secondary | ICD-10-CM | POA: Diagnosis not present

## 2015-09-06 DIAGNOSIS — F1190 Opioid use, unspecified, uncomplicated: Secondary | ICD-10-CM | POA: Diagnosis not present

## 2015-09-06 DIAGNOSIS — M792 Neuralgia and neuritis, unspecified: Secondary | ICD-10-CM

## 2015-09-06 DIAGNOSIS — Z794 Long term (current) use of insulin: Secondary | ICD-10-CM | POA: Insufficient documentation

## 2015-09-06 DIAGNOSIS — M47816 Spondylosis without myelopathy or radiculopathy, lumbar region: Secondary | ICD-10-CM | POA: Diagnosis not present

## 2015-09-06 DIAGNOSIS — K219 Gastro-esophageal reflux disease without esophagitis: Secondary | ICD-10-CM | POA: Insufficient documentation

## 2015-09-06 DIAGNOSIS — E1140 Type 2 diabetes mellitus with diabetic neuropathy, unspecified: Secondary | ICD-10-CM | POA: Diagnosis not present

## 2015-09-06 DIAGNOSIS — J309 Allergic rhinitis, unspecified: Secondary | ICD-10-CM | POA: Diagnosis not present

## 2015-09-06 DIAGNOSIS — H8109 Meniere's disease, unspecified ear: Secondary | ICD-10-CM | POA: Diagnosis not present

## 2015-09-06 DIAGNOSIS — M542 Cervicalgia: Secondary | ICD-10-CM | POA: Diagnosis present

## 2015-09-06 DIAGNOSIS — I129 Hypertensive chronic kidney disease with stage 1 through stage 4 chronic kidney disease, or unspecified chronic kidney disease: Secondary | ICD-10-CM | POA: Diagnosis not present

## 2015-09-06 DIAGNOSIS — Z9689 Presence of other specified functional implants: Secondary | ICD-10-CM | POA: Insufficient documentation

## 2015-09-06 DIAGNOSIS — N183 Chronic kidney disease, stage 3 (moderate): Secondary | ICD-10-CM | POA: Diagnosis not present

## 2015-09-06 DIAGNOSIS — I252 Old myocardial infarction: Secondary | ICD-10-CM | POA: Diagnosis not present

## 2015-09-06 DIAGNOSIS — Z86010 Personal history of colonic polyps: Secondary | ICD-10-CM | POA: Diagnosis not present

## 2015-09-06 DIAGNOSIS — G8929 Other chronic pain: Secondary | ICD-10-CM | POA: Insufficient documentation

## 2015-09-06 DIAGNOSIS — M4802 Spinal stenosis, cervical region: Secondary | ICD-10-CM | POA: Insufficient documentation

## 2015-09-06 DIAGNOSIS — M5412 Radiculopathy, cervical region: Secondary | ICD-10-CM | POA: Insufficient documentation

## 2015-09-06 DIAGNOSIS — I2510 Atherosclerotic heart disease of native coronary artery without angina pectoris: Secondary | ICD-10-CM | POA: Diagnosis not present

## 2015-09-06 DIAGNOSIS — Z79891 Long term (current) use of opiate analgesic: Secondary | ICD-10-CM

## 2015-09-06 DIAGNOSIS — Z981 Arthrodesis status: Secondary | ICD-10-CM | POA: Insufficient documentation

## 2015-09-06 DIAGNOSIS — M47812 Spondylosis without myelopathy or radiculopathy, cervical region: Secondary | ICD-10-CM | POA: Diagnosis not present

## 2015-09-06 DIAGNOSIS — G4733 Obstructive sleep apnea (adult) (pediatric): Secondary | ICD-10-CM | POA: Insufficient documentation

## 2015-09-06 DIAGNOSIS — M791 Myalgia: Secondary | ICD-10-CM | POA: Diagnosis not present

## 2015-09-06 DIAGNOSIS — M4804 Spinal stenosis, thoracic region: Secondary | ICD-10-CM | POA: Insufficient documentation

## 2015-09-06 DIAGNOSIS — E1122 Type 2 diabetes mellitus with diabetic chronic kidney disease: Secondary | ICD-10-CM | POA: Diagnosis not present

## 2015-09-06 DIAGNOSIS — M5416 Radiculopathy, lumbar region: Secondary | ICD-10-CM

## 2015-09-06 DIAGNOSIS — M7918 Myalgia, other site: Secondary | ICD-10-CM

## 2015-09-06 DIAGNOSIS — E782 Mixed hyperlipidemia: Secondary | ICD-10-CM | POA: Insufficient documentation

## 2015-09-06 DIAGNOSIS — M5124 Other intervertebral disc displacement, thoracic region: Secondary | ICD-10-CM | POA: Insufficient documentation

## 2015-09-06 DIAGNOSIS — M549 Dorsalgia, unspecified: Secondary | ICD-10-CM | POA: Diagnosis present

## 2015-09-06 DIAGNOSIS — Z9889 Other specified postprocedural states: Secondary | ICD-10-CM | POA: Diagnosis not present

## 2015-09-06 DIAGNOSIS — Z7982 Long term (current) use of aspirin: Secondary | ICD-10-CM | POA: Insufficient documentation

## 2015-09-06 MED ORDER — GABAPENTIN 800 MG PO TABS: 1600.0000 mg | ORAL_TABLET | Freq: Two times a day (BID) | ORAL | Status: DC

## 2015-09-06 MED ORDER — OXYCODONE HCL 10 MG PO TABS: 10.0000 mg | ORAL_TABLET | Freq: Four times a day (QID) | ORAL | Status: DC | PRN

## 2015-09-06 MED ORDER — CYCLOBENZAPRINE HCL 10 MG PO TABS: 10.0000 mg | ORAL_TABLET | Freq: Three times a day (TID) | ORAL | Status: DC | PRN

## 2015-09-06 MED ORDER — NALOXONE HCL 2 MG/2ML IJ SOSY: PREFILLED_SYRINGE | INTRAMUSCULAR | Status: DC

## 2015-09-06 NOTE — Progress Notes (Signed)
Patient's Name: Kenneth [facility]  Patient type: Established  MRN: [id number]  Service setting: Ambulatory outpatient  DOB: [birth year]  Location: [facility]  DOS: 09/06/2015  Primary Care Physician: Madelyn Brunner, MD  Note by: Kathlen Brunswick Dossie Arbour, M.D, DABA, DABAPM, DABPM, DABIPP, FIPP  Referring Physician: Madelyn Brunner, MD  Specialty: Board-Certified Interventional Pain Management  Last Visit to Pain Management: 07/04/2015   Primary Reason(s) for Visit: Encounter for prescription drug management (Level of risk: moderate) CC: Back Pain and Neck Pain   HPI  [facility] is a 60 y.o. year old, male patient, who returns today as an established patient. 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 Beverely Pace at [facility] on 01/04/2013); Long term current use of opiate analgesic; Long term prescription opiate use; Opiate use (60 MME/Day); Opiate dependence ([place]); Encounter for therapeutic drug level monitoring; Neurogenic pain; Neuropathic pain; Thoracic facet syndrome (T8-10); Lumbar facet syndrome (Bilateral); Cervical facet syndrome (right side); Cervical spondylosis; Lumbar spondylosis; Chronic pain of left upper extremity; Chronic cervical radicular pain (left side); Chronic upper back pain; History of thoracic spine surgery (S/P T9-10 IVD spacer); Failed back surgical syndrome; Musculoskeletal pain; Myofascial pain;  Chronic left lower extremity pain; Chronic radicular lumbar pain (left L4 dermatomal pain); Coronary artery disease; History of MI (myocardial infarction) (May 2012); Chronic anticoagulation (on Effient); History of Meniere's disease; Sleep apnea; Type 2 diabetes mellitus with hyperglycemia ([facility]); Dysphagia; Encounter for interrogation of neurostimulator; Status post insertion of spinal cord stimulator; Unstable angina ([facility]); Controlled type 2 diabetes mellitus without complication ([place]); Cervical pain; Arthralgia of shoulder; and CAD (coronary artery disease) on his problem list.. His primarily concern today is the Back Pain and Neck Pain   Pain Assessment: Self-Reported Pain Score: 4  Clinically the patient looks like a 1/10 Reported level is inconsistent with clinical obrservations Information on the proper use of the pain score provided to the patient today. Pain Type: Chronic pain Pain Location: Back Pain Orientation: Lower, Mid, Left Pain Descriptors / Indicators:  (back is dull to sharp, neck is aching) Pain Frequency: Constant  The patient comes into the clinics today for pharmacological management of his chronic pain. I last saw this patient on 07/04/2015. The patient  reports that he does not use illicit drugs. His body mass index is 32.47 kg/(m^2).  Date of Last Visit: 06/14/15 Service Provided on Last Visit: Med Refill  Controlled Substance Pharmacotherapy Assessment & REMS (Risk Evaluation and Mitigation Strategy)  Analgesic: Oxycodone IR 10 mg every 6 hours (40 mg/day) MME/day: 60 mg/day Pill Count: Oxycodone pill count #77/120 Filled 08-21-15. Pharmacokinetics: Onset of action (Liberation/Absorption): Within expected pharmacological parameters Time to Peak effect (Distribution): Timing and results are as within normal expected parameters Duration of action (Metabolism/Excretion): Within normal limits for medication Pharmacodynamics: Analgesic Effect: More than 50% Activity  Facilitation: Medication(s) allow patient to sit, stand, walk, and do the basic ADLs Perceived Effectiveness: Described as relatively effective, allowing for increase in activities of daily living (ADL) Side-effects or Adverse reactions: None reported Monitoring: [place] PMP:  Online review of the past 49-monthperiod conducted. Compliant with practice rules and regulations Last UDS on record: TOXASSURE SELECT 13  Date Value Ref Range Status  06/07/2015 FINAL  Final    Comment:    ==================================================================== TOXASSURE SELECT 13 (MW) ==================================================================== Test                             Result       Flag       Units Drug Present and Declared for Prescription Verification   Desmethyldiazepam              229          EXPECTED   ng/mg creat   Oxazepam                       1183         EXPECTED   ng/mg creat   Temazepam                      383          EXPECTED   ng/mg creat    Desmethyldiazepam, oxazepam, and temazepam are expected    metabolites of diazepam. Desmethyldiazepam and oxazepam are also    expected metabolites of other drugs, including chlordiazepoxide,    prazepam, clorazepate, and halazepam. Oxazepam is an expected    metabolite of temazepam. Oxazepam and temazepam are also    available as scheduled prescription medications.   Oxycodone                      2359         EXPECTED   ng/mg creat   Oxymorphone                    2172         EXPECTED   ng/mg creat   Noroxycodone                   2001         EXPECTED   ng/mg creat    Sources of oxycodone include scheduled prescription medications.    Oxymorphone and noroxycodone are expected metabolites of    oxycodone. Oxymorphone is also available as a scheduled    prescription medication. Drug Absent but Declared for Prescription Verification   Alprazolam                     Not Detected UNEXPECTED ng/mg  creat ==================================================================== Test                      Result    Flag   Units      Ref Range   Creatinine              75               mg/dL      >=20 ==================================================================== Declared Medications:  The flagging and interpretation on this report are based on the  following declared medications.  Unexpected results may arise from  inaccuracies in the declared medications.  **Note: The testing scope of this panel includes these medications:  Alprazolam (Xanax)  Diazepam (Valium)  Oxycodone  **Note: The testing scope of this panel does not include following  reported medications:  Aspirin (Aspirin 81)  Cetirizine (Zyrtec)  Cyclobenzaprine (Flexeril)  Diltiazem (Cartia  XT)  Enalapril (Vasotec)  Ezetimibe (Zetia)  Gabapentin (Neurontin)  Hydrochlorothiazide (Maxzide)  Insulin (Humalog)  Insulin (Lantus)  Magnesium (Mag-Ox)  Meclizine (Antivert)  Metformin (Glucophage)  Metoprolol (Toprol)  Montelukast (Singulair)  Niacin (Niaspan)  Nitroglycerin (Nitrostat)  Omega-3 Fatty Acids (Fish Oil)  Omeprazole (Nexium)  Prasugrel (Effient)  Ranitidine (Zantac)  Rosuvastatin (Crestor)  Triamterene (Maxzide) ==================================================================== For clinical consultation, please call [phone]. ====================================================================    UDS interpretation: Compliant          Medication Assessment Form: Reviewed. Patient indicates being compliant with therapy Treatment compliance: Compliant Risk Assessment: Aberrant Behavior: None observed today Substance Use Disorder (SUD) Risk Level: Low-to-moderate Risk of opioid abuse or dependence: 0.7-3.0% with doses ? 36 MME/day and 6.1-26% with doses ? 120 MME/day. Opioid Risk Tool (ORT) Score: Total Score: 0 Low Risk for SUD (Score <3) Depression Scale Score: PHQ-2: PHQ-2 Total Score:  0 No depression (0) PHQ-9: PHQ-9 Total Score: 0 No depression (0-4)  Pharmacologic Plan: No change in therapy, at this time  Laboratory Chemistry  Inflammation Markers Lab Results  Component Value Date   ESRSEDRATE 9 03/22/2015   CRP 1.4* 03/22/2015    Renal Function Lab Results  Component Value Date   BUN 20 06/16/2015   CREATININE 1.24 06/16/2015   GFRAA >60 06/16/2015   GFRNONAA >60 06/16/2015    Hepatic Function Lab Results  Component Value Date   AST 27 03/22/2015   ALT 34 03/22/2015   ALBUMIN 4.1 03/22/2015    Electrolytes Lab Results  Component Value Date   NA 139 06/16/2015   K 4.7 06/16/2015   CL 107 06/16/2015   CALCIUM 9.5 06/16/2015   MG 1.7 03/22/2015    Pain Modulating Vitamins No results found for: Maralyn Sago [id number], [id number], [id number], 25OHVITD1, 25OHVITD2, 25OHVITD3, VITAMINB12  Coagulation Parameters Lab Results  Component Value Date   INR 1.0 02/23/2014   LABPROT 12.9 02/23/2014   APTT 25.9 08/18/2013   PLT 180 06/16/2015    Cardiovascular Lab Results  Component Value Date   HGB 13.1 06/16/2015   HCT 38.5* 06/16/2015    Note: Lab results reviewed.  Recent Diagnostic Imaging  Cervical Imaging: Cervical MR wo contrast:  Results for orders placed in visit on 08/14/10  MR C Spine Ltd W/O Cm   Narrative * PRIOR REPORT IMPORTED FROM AN EXTERNAL SYSTEM *   PRIOR REPORT IMPORTED FROM THE SYNGO [place] EXAM:    neck pain cervical radiculitis  COMMENTS:   PROCEDURE:     MR  - MR CERVICAL SPINE WO CONT  - Aug 14 2010  8:13AM   RESULT:   History: Neck pain.   Comparison Study: No prior.   Findings: Multiplanar, multisequence imaging of the cervical spine is  obtained. There is degenerative endplate osteophyte formation and annular  bulge at C5-C6 with mild narrowing of the left neural foramen. No spinal  stenosis. These changes are very mild.   At C6-C7 degenerative endplate osteophyte formation with  annular bulge is  present. This results in bilateral moderate neural foraminal narrowing. A  small central disc protrusion may be present at this level.   At C7-T1, annular bulge and degenerative endplate osteophyte formation is  present with mild bilateral neural foraminal narrowing. No spinal  stenosis.  Cervical cord and craniovertebral junction and bony structures are normal.   IMPRESSION:   1. Mild degenerative disc disease with level specific findings as above.  No  prominent disc protrusion or high-grade spinal stenosis  noted.   Thank you for the opportunity to contribute to the care of your patient.       Thoracic Imaging: Thoracic MR w/wo contrast:  Results for orders placed in visit on 05/09/07  MR Thoracic Spine W Wo Contrast   Narrative * PRIOR REPORT IMPORTED FROM AN EXTERNAL SYSTEM *   PRIOR REPORT IMPORTED FROM THE SYNGO WORKFLOW SYSTEM   REASON FOR EXAM:    severe thoracic pain,r/o disc,discitis,frax,    diabetic  COMMENTS:   PROCEDURE:     MR  - MR THORACIC SPINE WO/W  - May 09 2007  1:10PM   RESULT:     Multiplanar multisequence MR imaging of the thoracic spine is  performed in standard fashion. There is no previous exam for comparison.   There is abnormal appearance at what appears to be the T8-T9 level  consistent with disc herniation and extrusion superiorly and to a lesser  extent inferiorly. This is causing deformity of the thecal sac and spinal  stenosis with deformity of the spinal cord as well. No cord edema is  evident. Surgical consultation is recommended. At other levels there is no  disc herniation. There is no other area of spinal stenosis. No paraspinal  masses appreciated.   IMPRESSION:      T8-T9 disc herniation with spinal stenosis and cord  deformity without cord edema. Surgical consultation is recommended. This  appears to be a central herniation. No definite foraminal stenosis is  evident.       Thoracic DG 2-3 views:  Results  for orders placed in visit on 05/09/07  DG Thoracic Spine 2 View   Narrative * PRIOR REPORT IMPORTED FROM AN EXTERNAL SYSTEM *   PRIOR REPORT IMPORTED FROM THE SYNGO WORKFLOW SYSTEM   REASON FOR EXAM:    pain  COMMENTS:   PROCEDURE:     DXR - DXR THORACIC  AP AND LATERAL  - May 09 2007  9:30AM   RESULT:     AP and lateral images of the thoracic spine demonstrate the  vertebral body heights are maintained. The spinal alignment appears to be  normal. No fracture is evident.   IMPRESSION:   No acute bony abnormality demonstrated.   Thank you for the opportunity to contribute to the care of your patient.       Lumbosacral Imaging: Lumbar CT wo contrast:  Results for orders placed in visit on 05/09/07  CT Lumbar Spine Wo Contrast   Narrative * PRIOR REPORT IMPORTED FROM AN EXTERNAL SYSTEM *   PRIOR REPORT IMPORTED FROM THE SYNGO WORKFLOW SYSTEM   REASON FOR EXAM:    back pain  COMMENTS:   PROCEDURE:     CT  - CT LUMBAR SPINE WO  - May 09 2007  6:01AM   RESULT:     Emergent CT scan of the lumbar spine is performed utilizing  multislice helical acquisition with multilevel reconstructions in the  axial  plane and soft tissue window settings submitted. Axial, sagittal and  coronal  bone window reconstructions are also submitted. There is some mild  degenerative spurring. There is mild disc bulge at L3-L4 and L4-L5.  Atherosclerotic calcification is present in the aorta. No fracture is  seen.  The alignment is maintained.   IMPRESSION:   No acute bony abnormality evident. If there is concern for acute disc  disease, MRI followup would be suggested. There is no definite evidence of  spinal stenosis on this exam. MRI would be more sensitive.  Thank you for the opportunity to contribute to the care of your patient.       Note: Imaging results reviewed.  Meds  The patient has a current medication list which includes the following prescription(s): accu-chek aviva plus,  alprazolam, aspirin ec, cartia xt, cetirizine, cyclobenzaprine, diazepam, esomeprazole, ezetimibe, gabapentin, humalog mix 75/25 kwikpen, insulin glargine, insulin lispro, insulin syringe-needle u-100, insulin syringe-needle u-100, magnesium oxide, meclizine, metformin, metoprolol succinate, montelukast, niacin, nitroglycerin, fish oil, oxycodone hcl, oxycodone hcl, oxycodone hcl, prasugrel, ranitidine, rosuvastatin, triamterene-hydrochlorothiazide, and naloxone.  Current Outpatient Prescriptions on File Prior to Visit  Medication Sig  . ACCU-CHEK AVIVA PLUS test strip   . ALPRAZolam (XANAX) 0.5 MG tablet Take 0.5 mg by mouth at bedtime as needed for anxiety.  Marland Kitchen aspirin EC 81 MG tablet Take 81 mg by mouth daily.  Marland Kitchen CARTIA XT 240 MG 24 hr capsule Take 240 mg by mouth daily.   . cetirizine (ZYRTEC) 10 MG tablet Take 10 mg by mouth daily.  . diazepam (VALIUM) 5 MG tablet Take 5 mg by mouth every 8 (eight) hours as needed.   Marland Kitchen esomeprazole (NEXIUM) 40 MG capsule Take 40 mg by mouth 2 (two) times daily.  Marland Kitchen ezetimibe (ZETIA) 10 MG tablet Take 10 mg by mouth daily.  Marland Kitchen HUMALOG MIX 75/25 KWIKPEN (75-25) 100 UNIT/ML Kwikpen Inject 60 Units into the skin 2 (two) times daily.   . insulin glargine (LANTUS) 100 UNIT/ML injection Inject 100 Units into the skin at bedtime.   . insulin lispro (HUMALOG) 100 UNIT/ML KiwkPen Take 30 units before lunch.  . Insulin Syringe-Needle U-100 (BD INSULIN SYRINGE ULTRAFINE) 31G X 5/16" 0.5 ML MISC Use one daily  . magnesium oxide (MAG-OX) 400 MG tablet Take 400 mg by mouth.  . metFORMIN (GLUCOPHAGE) 1000 MG tablet Take 1,000 mg by mouth 2 (two) times daily with a meal.  . metoprolol succinate (TOPROL-XL) 50 MG 24 hr tablet Take 100 mg by mouth 2 (two) times daily. Take with or immediately following a meal.  . montelukast (SINGULAIR) 10 MG tablet Take 10 mg by mouth at bedtime.   . niacin (NIASPAN) 500 MG CR tablet Take 500 mg by mouth at bedtime.  . nitroGLYCERIN (NITROSTAT)  0.4 MG SL tablet Place 0.4 mg under the tongue every 5 (five) minutes as needed for chest pain (chest pain).  . Omega-3 Fatty Acids (FISH OIL) 1200 MG CAPS Take 1,200 mg by mouth 2 (two) times daily.  . prasugrel (EFFIENT) 10 MG TABS Take 10 mg by mouth daily.  . ranitidine (ZANTAC) 150 MG tablet Take 150 mg by mouth.  . rosuvastatin (CRESTOR) 10 MG tablet Take 20 mg by mouth.  . triamterene-hydrochlorothiazide (MAXZIDE) 75-50 MG tablet Take 1 tablet by mouth daily.    No current facility-administered medications on file prior to visit.    ROS  Constitutional: Denies any fever or chills Gastrointestinal: No reported hemesis, hematochezia, vomiting, or acute GI distress Musculoskeletal: Denies any acute onset joint swelling, redness, loss of ROM, or weakness Neurological: No reported episodes of acute onset apraxia, aphasia, dysarthria, agnosia, amnesia, paralysis, loss of coordination, or loss of consciousness  Allergies  Mr. Carriker has No Known Allergies.  [place]  Medical:  Mr. Deady  has a past medical history of Myocardial infarction ([place]); Coronary artery disease; Hypertension; Meniere's disease; Bronchitis; Diabetes mellitus without complication ([place]); GERD (gastroesophageal reflux disease); Neuromuscular disorder ([place]); Chronic back pain; Sleep apnea; Hyperlipidemia; DDD (degenerative disc disease), cervical; Short-segment Barrett's esophagus; Dysphagia; Psychosis; COPD (chronic obstructive  pulmonary disease) ([place]); Hypercholesteremia; History of thoracic spine surgery (S/P T9-10 IVD spacer) (12/21/2014); and History of Meniere's disease (12/21/2014). Family: family history includes Cancer in his sister; Diabetes in his mother; Heart disease in his father, maternal aunt, maternal uncle, and mother. Surgical:  has past surgical history that includes Cardiovascular stress test; Cardiac catheterization; Coronary angioplasty; Labrinthectomy; mastoid shunt; Appendectomy; Back surgery; Shoulder  arthroscopy with subacromial decompression (Left, 04/06/2012); ARTHRODESIS ANTERIOR ANTERIOR CERVICLE SPINE (01/04/2013); Colonoscopy with propofol (N/A, 09/19/2014); Esophagogastroduodenoscopy (N/A, 09/19/2014); Savory dilation (N/A, 09/19/2014); Spinal cord stimulator implant (Right); Cardiac catheterization (N/A, 06/15/2015); and Cardiac catheterization (N/A, 06/15/2015). Tobacco:  reports that he has never smoked. He has never used smokeless tobacco. Alcohol:  reports that he does not drink alcohol. Drug:  reports that he does not use illicit drugs.  Constitutional Exam  Vitals: Blood pressure 134/82, pulse 102, temperature 98.2 F (36.8 C), resp. rate 18, height 5' 9"  (1.753 m), weight 220 lb (99.791 kg), SpO2 100 %. General appearance: Well nourished, well developed, and well hydrated. In no acute distress Calculated BMI/Body habitus: Body mass index is 32.47 kg/(m^2). (30-34.9 kg/m2) Obese (Class I) - 68% higher incidence of chronic pain Psych/Mental status: Alert and oriented x 3 (person, place, & time) Eyes: PERLA Respiratory: No evidence of acute respiratory distress  Cervical Spine Exam  Inspection: No masses, redness, or swelling Alignment: Symmetrical ROM: Functional: Reduced ROM Stability: No instability detected Muscle strength & Tone: Functionally intact Sensory: Movement-associated discomfort Palpation: No complaints of tenderness  Upper Extremity (UE) Exam    Side: Right upper extremity  Side: Left upper extremity  Inspection: No masses, redness, swelling, or asymmetry  Inspection: No masses, redness, swelling, or asymmetry  ROM:  ROM:  Functional: ROM is within functional limits [facility])        Functional: ROM is within functional limits [facility])        Muscle strength & Tone: Functionally intact  Muscle strength & Tone: Functionally intact  Sensory: Unimpaired  Sensory: Unimpaired  Palpation: No complaints of tenderness  Palpation: No complaints of tenderness   Thoracic Spine  Exam  Inspection: No masses, redness, or swelling Alignment: Symmetrical ROM: Functional: ROM is within functional limits [facility]) Stability: No instability detected Sensory: Unimpaired Muscle strength & Tone: Functionally intact Palpation: No complaints of tenderness  Lumbar Spine Exam  Inspection: No masses, redness, or swelling Alignment: Symmetrical ROM: Functional: Reduced ROM Stability: No instability detected Muscle strength & Tone: Functionally intact Sensory: Movement-associated discomfort Palpation: No complaints of tenderness Provocative Tests: Lumbar Hyperextension and rotation test: evaluation deferred today       Patrick's Maneuver: evaluation deferred today              Gait & Posture Assessment  Ambulation: Unassisted Gait: Unaffected Posture: WNL   Lower Extremity Exam    Side: Right lower extremity  Side: Left lower extremity  Inspection: No masses, redness, swelling, or asymmetry ROM:  Inspection: No masses, redness, swelling, or asymmetry ROM:  Functional: ROM is within functional limits [facility])        Functional: ROM is within functional limits [facility])        Muscle strength & Tone: Functionally intact  Muscle strength & Tone: Functionally intact  Sensory: Unimpaired  Sensory: Unimpaired  Palpation: No complaints of tenderness  Palpation: No complaints of tenderness   Assessment & Plan  Primary Diagnosis & Pertinent Problem List: The primary encounter diagnosis was Chronic pain. Diagnoses of Long term current use of opiate analgesic, Opiate use (60  MME/Day), Chronic cervical radicular pain (left side), Chronic radicular lumbar pain (left L4 dermatomal pain), Lumbar spondylosis, unspecified spinal osteoarthritis, Lumbar facet syndrome (Bilateral), Musculoskeletal pain, and Neuropathic pain were also pertinent to this visit.  Visit Diagnosis: 1. Chronic pain   2. Long term current use of opiate analgesic   3. Opiate use (60 MME/Day)   4. Chronic cervical  radicular pain (left side)   5. Chronic radicular lumbar pain (left L4 dermatomal pain)   6. Lumbar spondylosis, unspecified spinal osteoarthritis   7. Lumbar facet syndrome (Bilateral)   8. Musculoskeletal pain   9. Neuropathic pain     Problems updated and reviewed during this visit: No problems updated.  Problem-specific Plan(s): No problem-specific assessment & plan notes found for this encounter.  No new assessment & plan notes have been filed under this [facility] service since the last note was generated. Service: Pain Management   Plan of Care   Problem List Items Addressed This Visit      High   Chronic cervical radicular pain (left side) (Chronic)   Relevant Medications   cyclobenzaprine (FLEXERIL) 10 MG tablet   gabapentin (NEURONTIN) 800 MG tablet   Chronic pain - Primary (Chronic)   Relevant Medications   Oxycodone HCl 10 MG TABS   cyclobenzaprine (FLEXERIL) 10 MG tablet   Oxycodone HCl 10 MG TABS   Oxycodone HCl 10 MG TABS   gabapentin (NEURONTIN) 800 MG tablet   Chronic radicular lumbar pain (left L4 dermatomal pain) (Chronic)   Lumbar facet syndrome (Bilateral) (Chronic)   Relevant Medications   Oxycodone HCl 10 MG TABS   cyclobenzaprine (FLEXERIL) 10 MG tablet   Oxycodone HCl 10 MG TABS   Oxycodone HCl 10 MG TABS   Lumbar spondylosis (Chronic)   Relevant Medications   Oxycodone HCl 10 MG TABS   cyclobenzaprine (FLEXERIL) 10 MG tablet   Oxycodone HCl 10 MG TABS   Oxycodone HCl 10 MG TABS   Musculoskeletal pain (Chronic)   Relevant Medications   cyclobenzaprine (FLEXERIL) 10 MG tablet   Neuropathic pain (Chronic)   Relevant Medications   gabapentin (NEURONTIN) 800 MG tablet     Medium   Long term current use of opiate analgesic (Chronic)   Opiate use (60 MME/Day) (Chronic)       Pharmacotherapy (Medications Ordered): Meds ordered this encounter  Medications  . Oxycodone HCl 10 MG TABS    Sig: Take 1 tablet (10 mg total) by mouth every 6  (six) hours as needed.    Dispense:  120 tablet    Refill:  0    Do not place this medication, or any other prescription from our practice, on "Automatic Refill". Patient may have prescription filled one day early if pharmacy is closed on scheduled refill date. Do not fill until: 09/18/15 To last until: 10/18/15  . cyclobenzaprine (FLEXERIL) 10 MG tablet    Sig: Take 1 tablet (10 mg total) by mouth every 8 (eight) hours as needed for muscle spasms.    Dispense:  270 tablet    Refill:  0    3 Month refill.  Marland Kitchen DISCONTD: gabapentin (NEURONTIN) 800 MG tablet    Sig: Take 2 tablets (1,600 mg total) by mouth 2 (two) times daily.    Dispense:  120 tablet    Refill:  2    Do not place this medication, or any other prescription from our practice, on "Automatic Refill". Patient may have prescription filled one day early if pharmacy is closed on scheduled  refill date.  . Oxycodone HCl 10 MG TABS    Sig: Take 1 tablet (10 mg total) by mouth every 6 (six) hours as needed.    Dispense:  120 tablet    Refill:  0    Do not place this medication, or any other prescription from our practice, on "Automatic Refill". Patient may have prescription filled one day early if pharmacy is closed on scheduled refill date. Do not fill until: 10/18/15 To last until: 11/17/15  . Oxycodone HCl 10 MG TABS    Sig: Take 1 tablet (10 mg total) by mouth every 6 (six) hours as needed.    Dispense:  120 tablet    Refill:  0    Do not place this medication, or any other prescription from our practice, on "Automatic Refill". Patient may have prescription filled one day early if pharmacy is closed on scheduled refill date. Do not fill until: 11/17/15 To last until: 12/17/15  . gabapentin (NEURONTIN) 800 MG tablet    Sig: Take 2 tablets (1,600 mg total) by mouth 2 (two) times daily.    Dispense:  360 tablet    Refill:  0    Do not place this medication, or any other prescription from our practice, on "Automatic Refill".  Patient may have prescription filled one day early if pharmacy is closed on scheduled refill date. (3 mo refill)  . naloxone (NARCAN) 2 MG/2ML injection    Sig: Inject content of syringe into thigh muscle. Call 911.    Dispense:  2 Syringe    Refill:  1    NDC # [id number]. Please teach proper use of device.    Lab-work & Procedure Ordered: No orders of the defined types were placed in this encounter.    Imaging Ordered: None  Interventional Therapies: Scheduled: None at this time.    Considering: None at this time.    PRN Procedures: None at this time.        Referral(s) or Consult(s): None at this time.  New Prescriptions   NALOXONE (NARCAN) 2 MG/2ML INJECTION    Inject content of syringe into thigh muscle. Call 911.    Medications administered during this visit: Mr. Wiseman had no medications administered during this visit.  Requested PM Follow-up: Return in about 3 months (around 11/27/2015) for Med-Mgmt, (3-Mo).  Future Appointments Date Time Provider [place]  11/27/2015 8:40 AM Milinda Pointer, MD [place] None    Primary Care Physician: Madelyn Brunner, MD Location: [facility] Note by: Kathlen Brunswick Dossie Arbour, M.D, DABA, DABAPM, DABPM, DABIPP, FIPP  Pain Score Disclaimer: We use the NRS-11 scale. This is a self-reported, subjective measurement of pain severity with only modest accuracy. It is used primarily to identify changes within a particular patient. It must be understood that outpatient pain scales are significantly less accurate that those used for research, where they can be applied under ideal controlled circumstances with minimal exposure to variables. In reality, the score is likely to be a combination of pain intensity and pain affect, where pain affect describes the degree of emotional arousal or changes in action readiness caused by the sensory experience of pain. Factors such as social and work  situation, setting, emotional state, anxiety levels, expectation, and prior pain experience may influence pain perception and show large inter-individual differences that may also be affected by time variables.  Patient instructions provided during this appointment: There are no Patient Instructions on file for this visit.

## 2015-09-06 NOTE — Progress Notes (Signed)
Safety precautions to be maintained throughout the outpatient stay will include: orient to surroundings, keep bed in low position, maintain call bell within reach at all times, provide assistance with transfer out of bed and ambulation. Oxycodone pill count #77/120  Filled 08-21-15

## 2015-09-19 DIAGNOSIS — S060XAA Concussion with loss of consciousness status unknown, initial encounter: Secondary | ICD-10-CM

## 2015-09-19 DIAGNOSIS — S060X9A Concussion with loss of consciousness of unspecified duration, initial encounter: Secondary | ICD-10-CM

## 2015-09-19 HISTORY — DX: Concussion with loss of consciousness status unknown, initial encounter: S06.0XAA

## 2015-09-19 HISTORY — DX: Concussion with loss of consciousness of unspecified duration, initial encounter: S06.0X9A

## 2015-10-01 ENCOUNTER — Emergency Department (HOSPITAL_COMMUNITY): Payer: Medicare Other

## 2015-10-01 ENCOUNTER — Encounter (HOSPITAL_COMMUNITY): Payer: Self-pay

## 2015-10-01 ENCOUNTER — Emergency Department (HOSPITAL_COMMUNITY)
Admission: EM | Admit: 2015-10-01 | Discharge: 2015-10-01 | Disposition: A | Discharge status: Home / Self Care | Payer: Medicare Other | Attending: Emergency Medicine | Admitting: Emergency Medicine

## 2015-10-01 DIAGNOSIS — R55 Syncope and collapse: Secondary | ICD-10-CM | POA: Diagnosis present

## 2015-10-01 DIAGNOSIS — Z955 Presence of coronary angioplasty implant and graft: Secondary | ICD-10-CM | POA: Diagnosis not present

## 2015-10-01 DIAGNOSIS — N183 Chronic kidney disease, stage 3 (moderate): Secondary | ICD-10-CM | POA: Diagnosis not present

## 2015-10-01 DIAGNOSIS — Y939 Activity, unspecified: Secondary | ICD-10-CM | POA: Insufficient documentation

## 2015-10-01 DIAGNOSIS — J449 Chronic obstructive pulmonary disease, unspecified: Secondary | ICD-10-CM | POA: Diagnosis not present

## 2015-10-01 DIAGNOSIS — S0990XA Unspecified injury of head, initial encounter: Secondary | ICD-10-CM | POA: Diagnosis not present

## 2015-10-01 DIAGNOSIS — Z7982 Long term (current) use of aspirin: Secondary | ICD-10-CM | POA: Diagnosis not present

## 2015-10-01 DIAGNOSIS — Z794 Long term (current) use of insulin: Secondary | ICD-10-CM | POA: Insufficient documentation

## 2015-10-01 DIAGNOSIS — E119 Type 2 diabetes mellitus without complications: Secondary | ICD-10-CM | POA: Insufficient documentation

## 2015-10-01 DIAGNOSIS — E1165 Type 2 diabetes mellitus with hyperglycemia: Secondary | ICD-10-CM | POA: Insufficient documentation

## 2015-10-01 DIAGNOSIS — I2510 Atherosclerotic heart disease of native coronary artery without angina pectoris: Secondary | ICD-10-CM | POA: Insufficient documentation

## 2015-10-01 DIAGNOSIS — E1142 Type 2 diabetes mellitus with diabetic polyneuropathy: Secondary | ICD-10-CM | POA: Diagnosis not present

## 2015-10-01 DIAGNOSIS — I129 Hypertensive chronic kidney disease with stage 1 through stage 4 chronic kidney disease, or unspecified chronic kidney disease: Secondary | ICD-10-CM | POA: Insufficient documentation

## 2015-10-01 DIAGNOSIS — Y999 Unspecified external cause status: Secondary | ICD-10-CM | POA: Diagnosis not present

## 2015-10-01 DIAGNOSIS — I252 Old myocardial infarction: Secondary | ICD-10-CM | POA: Insufficient documentation

## 2015-10-01 DIAGNOSIS — Z7984 Long term (current) use of oral hypoglycemic drugs: Secondary | ICD-10-CM | POA: Insufficient documentation

## 2015-10-01 DIAGNOSIS — Y929 Unspecified place or not applicable: Secondary | ICD-10-CM | POA: Insufficient documentation

## 2015-10-01 DIAGNOSIS — W11XXXA Fall on and from ladder, initial encounter: Secondary | ICD-10-CM | POA: Diagnosis not present

## 2015-10-01 LAB — CBG MONITORING, ED: Glucose-Capillary: 281 mg/dL — ABNORMAL HIGH (ref 65–99)

## 2015-10-01 MED ORDER — INSULIN ASPART 100 UNIT/ML ~~LOC~~ SOLN
20.0000 [IU] | Freq: Once | SUBCUTANEOUS | Status: AC
  Administered 2015-10-01: 20 [IU] via SUBCUTANEOUS
  Filled 2015-10-01: qty 1

## 2015-10-01 MED ORDER — MECLIZINE HCL 25 MG PO TABS
25.0000 mg | ORAL_TABLET | Freq: Once | ORAL | Status: AC
  Administered 2015-10-01: 25 mg via ORAL
  Filled 2015-10-01: qty 1

## 2015-10-01 MED ORDER — ACETAMINOPHEN 500 MG PO TABS
1000.0000 mg | ORAL_TABLET | Freq: Once | ORAL | Status: AC
Start: 2015-10-01 — End: 2015-10-01
  Administered 2015-10-01: 1000 mg via ORAL
  Filled 2015-10-01: qty 2

## 2015-10-01 MED ORDER — LORAZEPAM 2 MG/ML IJ SOLN
1.0000 mg | Freq: Once | INTRAMUSCULAR | Status: AC
  Administered 2015-10-01: 1 mg via INTRAVENOUS
  Filled 2015-10-01: qty 1

## 2015-10-01 MED ORDER — MORPHINE SULFATE (PF) 4 MG/ML IV SOLN
4.0000 mg | Freq: Once | INTRAVENOUS | Status: AC
  Administered 2015-10-01: 4 mg via INTRAVENOUS
  Filled 2015-10-01: qty 1

## 2015-10-01 MED ORDER — DIAZEPAM 5 MG PO TABS
5.0000 mg | ORAL_TABLET | Freq: Once | ORAL | Status: DC
  Filled 2015-10-01: qty 1

## 2015-10-01 NOTE — ED Provider Notes (Signed)
[facility] DEPT Provider Note   CSN: [id number] Arrival date & time: 10/01/15  1608  First Provider Contact:  First MD Initiated Contact with Patient 10/01/15 1613        History   Chief Complaint Chief Complaint  Patient presents with  . Fall  . Loss of Consciousness    possible    HPI Kenneth [place] is a 60 y.o. male.Patient fell off the back of a stationary RV immediately prior to coming here striking his head. He thinks brief loss of consciousness. He complains of occipital headache and left-sided paracervical neck pain since the event. EMS obtained a CBG which was 327. Patient reports he did not take his regularly scheduled afternoon dose of insulin, which is Humalog 30 units no other associated symptoms. He felt well prior to the event.  HPI.  Past Medical History:  Diagnosis Date  . Bronchitis    hx of  . Chronic back pain    thoracic area  . COPD (chronic obstructive pulmonary disease) ([place])   . Coronary artery disease    99% blockage  . DDD (degenerative disc disease), cervical   . 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 Dysphagia   . GERD (gastroesophageal reflux disease)   . History of Meniere's disease 12/21/2014  . History of thoracic spine surgery (S/P T9-10 IVD spacer) 12/21/2014  . Hypercholesteremia   . Hyperlipidemia   . Hypertension    sees Dr. Jenny Reichmann walker Jefm Bryant  . 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 Myocardial infarction [facility])    Sees Dr. Drema [place], [place] [facility]  . Neuromuscular disorder ([place])    diabetic neuropathy in feet  . Psychosis   . Short-segment Barrett's esophagus   . Sleep apnea    uses cpap and 2 L o2 at hs    Patient Active Problem List   Diagnosis Date Noted  . Paroxysmal supraventricular tachycardia ([place]) 08/31/2015  . CAD (coronary artery disease) 06/15/2015  . Controlled type 2 diabetes mellitus without complication ([place]) 70/02/7492  . Unstable angina ([place])  06/08/2015  . Dysphagia 03/22/2015  . Encounter for interrogation of neurostimulator 03/22/2015  . Status post insertion of spinal cord stimulator 03/22/2015  . Type 2 diabetes mellitus with hyperglycemia ([place]) 03/15/2015  . Type 2 diabetes mellitus ([place]) 12/21/2014  . Acid reflux 12/21/2014  . Obstructive apnea 12/21/2014  . Chronic pain 12/21/2014  . S/P insertion of spinal cord stimulator 12/21/2014  . Chronic low back pain 12/21/2014  . 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 Beverely Pace at [facility] on 01/04/2013) 12/21/2014  . Long term current use of opiate analgesic 12/21/2014  . Long term prescription opiate use 12/21/2014  . Opiate use (60 MME/Day) 12/21/2014  . Opiate dependence ([place]) 12/21/2014  . Encounter for therapeutic drug level monitoring 12/21/2014  . Neurogenic pain 12/21/2014  . Neuropathic pain 12/21/2014  . Thoracic facet syndrome (T8-10) 12/21/2014  . Lumbar facet syndrome (Bilateral) 12/21/2014  . Cervical facet syndrome (right side) 12/21/2014  . Cervical spondylosis 12/21/2014  . Lumbar spondylosis 12/21/2014  . Chronic pain of left upper extremity 12/21/2014  . Chronic cervical radicular pain (left side) 12/21/2014  . Chronic upper back pain 12/21/2014  . History of thoracic spine surgery (S/P T9-10 IVD spacer) 12/21/2014  . 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 Musculoskeletal pain 12/21/2014  . Myofascial pain 12/21/2014  . Chronic left lower extremity pain 12/21/2014  . Chronic radicular lumbar  pain (left L4 dermatomal pain) 12/21/2014  . Coronary artery disease 12/21/2014  . History of MI (myocardial infarction) (May 2012) 12/21/2014  . Chronic anticoagulation (on Effient) 12/21/2014  . History of Meniere's disease 12/21/2014  . Sleep apnea 12/21/2014  . History of spinal surgery 11/14/2014  . Chronic kidney disease (CKD), stage III (moderate) 09/26/2014  . Drug-induced hypotension 09/22/2014  . Benign essential HTN 09/14/2014  .  Barrett esophagus 08/12/2014  . Can't get food down 08/12/2014  . H/O adenomatous polyp of colon 08/12/2014  . Diabetic polyneuropathy ([place]) 06/20/2014  . Reflux 01/25/2014  . Arteriosclerosis of coronary artery 08/13/2013  . Combined fat and carbohydrate induced hyperlipemia 08/13/2013  . Auditory vertigo 12/30/2012  . Allergic rhinitis 12/30/2012  . Cervical spinal stenosis 12/11/2012  . Cervical pain 10/01/2012  . Nerve root pain 03/24/2012  . Arthralgia of shoulder 03/16/2012    Past Surgical History:  Procedure Laterality Date  . APPENDECTOMY    . ARTHRODESIS ANTERIOR ANTERIOR CERVICLE SPINE  01/04/2013  . BACK SURGERY     fusion thoracic area  . CARDIAC CATHETERIZATION     may 2012 and Nov 20, 2010  . CARDIAC CATHETERIZATION N/A 06/15/2015   Procedure: Left Heart Cath and Coronary Angiography;  Surgeon: Corey Skains, MD;  Location: [facility];  Service: Cardiovascular;  Laterality: N/A;  . CARDIAC CATHETERIZATION N/A 06/15/2015   Procedure: Coronary Stent Intervention;  Surgeon: Isaias Cowman, MD;  Location: [facility];  Service: Cardiovascular;  Laterality: N/A;  . CARDIOVASCULAR STRESS TEST     jan 2014  . COLONOSCOPY WITH PROPOFOL N/A 09/19/2014   Procedure: COLONOSCOPY WITH PROPOFOL;  Surgeon: Manya Silvas, MD;  Location: [facility] ENDOSCOPY;  Service: Endoscopy;  Laterality: N/A;  . CORONARY ANGIOPLASTY     stent placement  . ESOPHAGOGASTRODUODENOSCOPY N/A 09/19/2014   Procedure: ESOPHAGOGASTRODUODENOSCOPY (EGD);  Surgeon: Manya Silvas, MD;  Location: [facility] ENDOSCOPY;  Service: Endoscopy;  Laterality: N/A;  . [place] right ear  . mastoid shunt     left, 2002, 1997 right ear, 1980 right ear  . SAVORY DILATION N/A 09/19/2014   Procedure: SAVORY DILATION;  Surgeon: Manya Silvas, MD;  Location: [facility] ENDOSCOPY;  Service: Endoscopy;  Laterality: N/A;  . SHOULDER ARTHROSCOPY WITH SUBACROMIAL DECOMPRESSION Left 04/06/2012    Procedure: SHOULDER ARTHROSCOPY WITH SUBACROMIAL DECOMPRESSION;  Surgeon: Vickey Huger, MD;  Location: [facility];  Service: Orthopedics;  Laterality: Left;  left shoulder arthroscopy, subacromial decompression and distal clavicle resection  . SPINAL CORD STIMULATOR IMPLANT Right        Home Medications    Prior to Admission medications   Medication Sig Start Date End Date Taking? Authorizing Provider  ACCU-CHEK AVIVA PLUS test strip  01/29/15   Historical Provider, MD  ALPRAZolam Duanne Moron) 0.5 MG tablet Take 0.5 mg by mouth at bedtime as needed for anxiety.    Historical Provider, MD  aspirin EC 81 MG tablet Take 81 mg by mouth daily.    Historical Provider, MD  CARTIA XT 240 MG 24 hr capsule Take 240 mg by mouth daily.  04/17/15   Historical Provider, MD  cetirizine (ZYRTEC) 10 MG tablet Take 10 mg by mouth daily.    Historical Provider, MD  cyclobenzaprine (FLEXERIL) 10 MG tablet Take 1 tablet (10 mg total) by mouth every 8 (eight) hours as needed for muscle spasms. 09/06/15   Milinda Pointer, MD  diazepam (VALIUM) 5 MG tablet Take 5 mg by mouth every  8 (eight) hours as needed.     Historical Provider, MD  esomeprazole (NEXIUM) 40 MG capsule Take 40 mg by mouth 2 (two) times daily.    Historical Provider, MD  ezetimibe (ZETIA) 10 MG tablet Take 10 mg by mouth daily.    Historical Provider, MD  gabapentin (NEURONTIN) 800 MG tablet Take 2 tablets (1,600 mg total) by mouth 2 (two) times daily. 09/06/15   Milinda Pointer, MD  HUMALOG MIX 75/25 KWIKPEN (75-25) 100 UNIT/ML Kwikpen Inject 60 Units into the skin 2 (two) times daily.  03/18/15   Historical Provider, MD  insulin glargine (LANTUS) 100 UNIT/ML injection Inject 100 Units into the skin at bedtime.     Historical Provider, MD  insulin lispro (HUMALOG) 100 UNIT/ML KiwkPen Take 30 units before lunch. 03/15/15   Historical Provider, MD  Insulin Syringe-Needle U-100 (BD INSULIN SYRINGE ULTRAFINE) 31G X 15/64" 1 ML MISC  09/05/15   Historical Provider,  MD  Insulin Syringe-Needle U-100 (BD INSULIN SYRINGE ULTRAFINE) 31G X 5/16" 0.5 ML MISC Use one daily 02/15/15   Historical Provider, MD  magnesium oxide (MAG-OX) 400 MG tablet Take 400 mg by mouth.    Historical Provider, MD  meclizine (ANTIVERT) 25 MG tablet  07/06/15   Historical Provider, MD  metFORMIN (GLUCOPHAGE) 1000 MG tablet Take 1,000 mg by mouth 2 (two) times daily with a meal.    Historical Provider, MD  metoprolol succinate (TOPROL-XL) 50 MG 24 hr tablet Take 100 mg by mouth 2 (two) times daily. Take with or immediately following a meal.    Historical Provider, MD  montelukast (SINGULAIR) 10 MG tablet Take 10 mg by mouth at bedtime.  02/23/15 02/23/16  Historical Provider, MD  naloxone Karma Greaser) 2 MG/2ML injection Inject content of syringe into thigh muscle. Call 911. 09/06/15   Milinda Pointer, MD  niacin (NIASPAN) 500 MG CR tablet Take 500 mg by mouth at bedtime.    Historical Provider, MD  nitroGLYCERIN (NITROSTAT) 0.4 MG SL tablet Place 0.4 mg under the tongue every 5 (five) minutes as needed for chest pain (chest pain).    Historical Provider, MD  Omega-3 Fatty Acids (FISH OIL) 1200 MG CAPS Take 1,200 mg by mouth 2 (two) times daily.    Historical Provider, MD  Oxycodone HCl 10 MG TABS Take 1 tablet (10 mg total) by mouth every 6 (six) hours as needed. 09/06/15   Milinda Pointer, MD  Oxycodone HCl 10 MG TABS Take 1 tablet (10 mg total) by mouth every 6 (six) hours as needed. 09/06/15   Milinda Pointer, MD  Oxycodone HCl 10 MG TABS Take 1 tablet (10 mg total) by mouth every 6 (six) hours as needed. 09/06/15   Milinda Pointer, MD  prasugrel (EFFIENT) 10 MG TABS Take 10 mg by mouth daily.    Historical Provider, MD  ranitidine (ZANTAC) 150 MG tablet Take 150 mg by mouth. 02/23/15   Historical Provider, MD  rosuvastatin (CRESTOR) 10 MG tablet Take 20 mg by mouth.    Historical Provider, MD  triamterene-hydrochlorothiazide (MAXZIDE) 75-50 MG tablet Take 1 tablet by mouth daily.  02/23/15    Historical Provider, MD    Family History Family History  Problem Relation Age of Onset  . Heart disease Mother   . Diabetes Mother   . Heart disease Father   . Cancer Sister   . Heart disease Maternal Aunt   . Heart disease Maternal Uncle     Social History Social History  Substance Use Topics  . Smoking  status: Never Smoker  . Smokeless tobacco: Never Used  . Alcohol use No     Allergies   Review of patient's allergies indicates no known allergies.   Review of Systems Review of Systems  Constitutional: Negative.   HENT: Positive for hearing loss.        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   Cardiovascular: Negative.   Gastrointestinal: Negative.   Musculoskeletal: Positive for neck pain.  Skin: Positive for wound.       Abrasion to scalp  Allergic/Immunologic: Positive for immunocompromised state.       Last tetanus immunization less than 10 years ago. Diabetic  Neurological: Positive for headaches.  Psychiatric/Behavioral: Negative.   All other systems reviewed and are negative.    Physical Exam Updated Vital Signs BP 124/80 (BP Location: Right Arm)   Pulse 92   Temp 98.7 F (37.1 C) (Oral)   Resp 20   SpO2 97%   Physical Exam  Constitutional: He is oriented to person, place, and time. He appears well-developed and well-nourished. No distress.  Alert Glasgow Coma Score 15  HENT:  Golf ball size hematoma with overlying abrasion at the vertex of scalp otherwise normocephalic atraumatic  Eyes: Conjunctivae are normal. Pupils are equal, round, and reactive to light.  Neck: Neck supple. No tracheal deviation present. No thyromegaly present.  Cardiovascular: Normal rate and regular rhythm.   No murmur heard. Pulmonary/Chest: Effort normal and breath sounds normal.  Abdominal: Soft. Bowel sounds are normal. He exhibits no distension. There is no tenderness.  Musculoskeletal: Normal range of motion. He exhibits no edema, tenderness or  deformity.  Entire spine nontender. He does have left-sided paracervical tenderness. Pelvis stable nontender All 4 extremities without deformity or tenderness, neurovascularly intact  Neurological: He is alert and oriented to person, place, and time. No cranial nerve deficit. Coordination normal.  Motor strength 5 over 5 overall  Skin: Skin is warm and dry. Capillary refill takes less than 2 seconds. No rash noted.  Psychiatric: He has a normal mood and affect. His behavior is normal. Judgment and thought content normal.  Nursing note and vitals reviewed.    ED Treatments / Results  Labs (all labs ordered are listed, but only abnormal results are displayed) Labs Reviewed - No data to display  EKG  EKG Interpretation None       Radiology No results found.  Procedures Procedures (including critical care time)  Medications Ordered in ED Medications  insulin aspart (novoLOG) injection 20 Units (not administered)  acetaminophen (TYLENOL) tablet 1,000 mg (not administered)     Initial Impression / Assessment and Plan / ED Course  I have reviewed the triage vital signs and the nursing notes.  Pertinent labs & imaging results that were available during my care of the patient were reviewed by me and considered in my medical decision making (see chart for details).  Clinical Course  Comment By Time  Continues to complain of severe headache after treatment with Tylenol. Patient is awake alert Glasgow Coma Score 15. IV morphine ordered. Orlie Dakin, MD 08/13 1715  Further history from patient's wife arrived. He fell prostate 3 feet off the last rung of a ladder of an RV. He stumbled backward. No definite loss of consciousness  Orlie Dakin, MD 08/13 1715  Patient presently complains of dizziness i.e. sensation of room spinning typical of Mnire's disease that he's had in the past. He is unable to stand due to sensation of room spinning. He is  prescribed Valium and meclizine for  vertigo. Oral Valium and meclizine ordered Orlie Dakin, MD 08/13 1832   Results for orders placed or performed during the hospital encounter of 10/01/15  CBG monitoring, ED  Result Value Ref Range   Glucose-Capillary 281 (H) 65 - 99 mg/dL   Ct Head Wo Contrast  Result Date: 10/01/2015 CLINICAL DATA:  Fall backwards and hit head. Altered mental status. Headache and nausea. Posterior neck pain. Initial encounter. EXAM: CT HEAD WITHOUT CONTRAST CT CERVICAL SPINE WITHOUT CONTRAST TECHNIQUE: Multidetector CT imaging of the head and cervical spine was performed following the standard protocol without intravenous contrast. Multiplanar CT image reconstructions of the cervical spine were also generated. COMPARISON:  02/06/2012 FINDINGS: CT HEAD FINDINGS No evidence of intracranial hemorrhage, brain edema, or other signs of acute infarction. No evidence of intracranial mass lesion or mass effect. No abnormal extraaxial fluid collections identified. Ventricles are normal in size. No skull fracture identified. Previous bilateral mastoidectomies again noted. CT CERVICAL SPINE FINDINGS No evidence of acute fracture, subluxation, or prevertebral soft tissue swelling. Previous anterior cervical spine fusion seen at C5-6. Other intervertebral disc spaces are maintained. No evidence facet arthropathy or other bone lesions. IMPRESSION: Negative noncontrast head CT. Prior bilateral mastoidectomies noted. No evidence of acute cervical spine fracture or subluxation. Prior anterior cervical spine fusion at C5-6. Electronically Signed   By: Earle Gell M.D.   On: 10/01/2015 17:30   Ct Cervical Spine Wo Contrast  Result Date: 10/01/2015 CLINICAL DATA:  Fall backwards and hit head. Altered mental status. Headache and nausea. Posterior neck pain. Initial encounter. EXAM: CT HEAD WITHOUT CONTRAST CT CERVICAL SPINE WITHOUT CONTRAST TECHNIQUE: Multidetector CT imaging of the head and cervical spine was performed following the  standard protocol without intravenous contrast. Multiplanar CT image reconstructions of the cervical spine were also generated. COMPARISON:  02/06/2012 FINDINGS: CT HEAD FINDINGS No evidence of intracranial hemorrhage, brain edema, or other signs of acute infarction. No evidence of intracranial mass lesion or mass effect. No abnormal extraaxial fluid collections identified. Ventricles are normal in size. No skull fracture identified. Previous bilateral mastoidectomies again noted. CT CERVICAL SPINE FINDINGS No evidence of acute fracture, subluxation, or prevertebral soft tissue swelling. Previous anterior cervical spine fusion seen at C5-6. Other intervertebral disc spaces are maintained. No evidence facet arthropathy or other bone lesions. IMPRESSION: Negative noncontrast head CT. Prior bilateral mastoidectomies noted. No evidence of acute cervical spine fracture or subluxation. Prior anterior cervical spine fusion at C5-6. Electronically Signed   By: Earle Gell M.D.   On: 10/01/2015 17:30   Correction he fell approximately 3 feet. 9:15 PM patient feels improved after treatment with intravenous Ativan and meclizine. He is able to walk gait mildly unsteady. Patient was offered overnight observation in the hospital which he declined. He has meclizine and Valium at home which she takes regularly for vertigo. His wife agrees to his side knowing that he is a fall risk. Final Clinical Impressions(s) / ED Diagnoses  Diagnosis #1 fall #2 minor closed head trauma #3 vertigo Final diagnoses:  None    New Prescriptions New Prescriptions   No medications on file     Orlie Dakin, MD 10/01/15 2134

## 2015-10-01 NOTE — ED Triage Notes (Signed)
Patient brought by EMS for a fall while standing on a ladder off the back of an RV.  States he cannot remember if he had LOC.  Patient on Effient, blood thinner.  Minimal bleeding to occipital region of the head. Patient A&Ox4

## 2015-10-01 NOTE — Discharge Instructions (Signed)
Take your Valium and/or meclizine as needed for dizziness. Take Tylenol as directed for pain. CT scan of your brain showed no bleeding into the brain. CT scan of the spine showed no injury. Wash the wound on your scalp every day with soap and water and place a thin layer of bacitracin and over the wound. Return if concern for any reason or see your primary care physician

## 2015-10-01 NOTE — ED Notes (Signed)
Patient Alert and oriented X4. Stable and ambulatory. Patient verbalized understanding of the discharge instructions.  Patient belongings were taken by the patient.  

## 2015-10-04 ENCOUNTER — Ambulatory Visit
Admission: RE | Admit: 2015-10-04 | Discharge: 2015-10-04 | Disposition: A | Discharge status: Home / Self Care | Payer: Medicare Other | Source: Ambulatory Visit | Attending: Physician Assistant | Admitting: Physician Assistant

## 2015-10-04 ENCOUNTER — Other Ambulatory Visit: Payer: Self-pay | Admitting: Physician Assistant

## 2015-10-04 DIAGNOSIS — X58XXXA Exposure to other specified factors, initial encounter: Secondary | ICD-10-CM | POA: Diagnosis not present

## 2015-10-04 DIAGNOSIS — S0990XD Unspecified injury of head, subsequent encounter: Secondary | ICD-10-CM

## 2015-10-04 DIAGNOSIS — S0003XA Contusion of scalp, initial encounter: Secondary | ICD-10-CM | POA: Diagnosis not present

## 2015-10-09 DIAGNOSIS — R42 Dizziness and giddiness: Secondary | ICD-10-CM | POA: Insufficient documentation

## 2015-10-09 DIAGNOSIS — G44229 Chronic tension-type headache, not intractable: Secondary | ICD-10-CM | POA: Insufficient documentation

## 2015-10-09 DIAGNOSIS — G44309 Post-traumatic headache, unspecified, not intractable: Secondary | ICD-10-CM | POA: Insufficient documentation

## 2015-10-09 DIAGNOSIS — R2689 Other abnormalities of gait and mobility: Secondary | ICD-10-CM | POA: Insufficient documentation

## 2015-10-31 ENCOUNTER — Other Ambulatory Visit: Payer: Self-pay | Admitting: Pain Medicine

## 2015-10-31 DIAGNOSIS — M792 Neuralgia and neuritis, unspecified: Secondary | ICD-10-CM

## 2015-11-02 ENCOUNTER — Other Ambulatory Visit: Payer: Self-pay | Admitting: Neurology

## 2015-11-02 DIAGNOSIS — R519 Headache, unspecified: Secondary | ICD-10-CM

## 2015-11-02 DIAGNOSIS — R51 Headache: Secondary | ICD-10-CM

## 2015-11-02 DIAGNOSIS — R42 Dizziness and giddiness: Secondary | ICD-10-CM

## 2015-11-13 ENCOUNTER — Ambulatory Visit
Admission: RE | Admit: 2015-11-13 | Discharge: 2015-11-13 | Disposition: A | Discharge status: Home / Self Care | Payer: Medicare Other | Source: Ambulatory Visit | Attending: Neurology | Admitting: Neurology

## 2015-11-13 DIAGNOSIS — R519 Headache, unspecified: Secondary | ICD-10-CM

## 2015-11-13 DIAGNOSIS — R51 Headache: Secondary | ICD-10-CM | POA: Diagnosis not present

## 2015-11-13 DIAGNOSIS — R42 Dizziness and giddiness: Secondary | ICD-10-CM | POA: Insufficient documentation

## 2015-11-27 ENCOUNTER — Encounter: Payer: Self-pay | Admitting: Pain Medicine

## 2015-11-27 ENCOUNTER — Ambulatory Visit: Payer: Medicare Other | Attending: Pain Medicine | Admitting: Pain Medicine

## 2015-11-27 VITALS — BP 143/83 | HR 101 | Temp 97.6°F | Resp 16 | Ht 69.0 in | Wt 225.0 lb

## 2015-11-27 DIAGNOSIS — Z79891 Long term (current) use of opiate analgesic: Secondary | ICD-10-CM | POA: Insufficient documentation

## 2015-11-27 DIAGNOSIS — M545 Low back pain: Secondary | ICD-10-CM | POA: Diagnosis not present

## 2015-11-27 DIAGNOSIS — R4701 Aphasia: Secondary | ICD-10-CM | POA: Diagnosis not present

## 2015-11-27 DIAGNOSIS — K219 Gastro-esophageal reflux disease without esophagitis: Secondary | ICD-10-CM | POA: Insufficient documentation

## 2015-11-27 DIAGNOSIS — Z79899 Other long term (current) drug therapy: Secondary | ICD-10-CM | POA: Insufficient documentation

## 2015-11-27 DIAGNOSIS — N183 Chronic kidney disease, stage 3 (moderate): Secondary | ICD-10-CM | POA: Insufficient documentation

## 2015-11-27 DIAGNOSIS — M791 Myalgia: Secondary | ICD-10-CM | POA: Insufficient documentation

## 2015-11-27 DIAGNOSIS — M7918 Myalgia, other site: Secondary | ICD-10-CM

## 2015-11-27 DIAGNOSIS — Z794 Long term (current) use of insulin: Secondary | ICD-10-CM | POA: Insufficient documentation

## 2015-11-27 DIAGNOSIS — I252 Old myocardial infarction: Secondary | ICD-10-CM | POA: Diagnosis not present

## 2015-11-27 DIAGNOSIS — R488 Other symbolic dysfunctions: Secondary | ICD-10-CM | POA: Diagnosis not present

## 2015-11-27 DIAGNOSIS — I25110 Atherosclerotic heart disease of native coronary artery with unstable angina pectoris: Secondary | ICD-10-CM | POA: Diagnosis not present

## 2015-11-27 DIAGNOSIS — M542 Cervicalgia: Secondary | ICD-10-CM | POA: Diagnosis not present

## 2015-11-27 DIAGNOSIS — I129 Hypertensive chronic kidney disease with stage 1 through stage 4 chronic kidney disease, or unspecified chronic kidney disease: Secondary | ICD-10-CM | POA: Insufficient documentation

## 2015-11-27 DIAGNOSIS — E1142 Type 2 diabetes mellitus with diabetic polyneuropathy: Secondary | ICD-10-CM | POA: Insufficient documentation

## 2015-11-27 DIAGNOSIS — G894 Chronic pain syndrome: Secondary | ICD-10-CM

## 2015-11-27 DIAGNOSIS — I471 Supraventricular tachycardia: Secondary | ICD-10-CM | POA: Diagnosis not present

## 2015-11-27 DIAGNOSIS — E1165 Type 2 diabetes mellitus with hyperglycemia: Secondary | ICD-10-CM | POA: Diagnosis not present

## 2015-11-27 DIAGNOSIS — Z7982 Long term (current) use of aspirin: Secondary | ICD-10-CM | POA: Diagnosis not present

## 2015-11-27 DIAGNOSIS — F1190 Opioid use, unspecified, uncomplicated: Secondary | ICD-10-CM

## 2015-11-27 DIAGNOSIS — E0842 Diabetes mellitus due to underlying condition with diabetic polyneuropathy: Secondary | ICD-10-CM

## 2015-11-27 DIAGNOSIS — M792 Neuralgia and neuritis, unspecified: Secondary | ICD-10-CM

## 2015-11-27 DIAGNOSIS — G3184 Mild cognitive impairment, so stated: Secondary | ICD-10-CM | POA: Insufficient documentation

## 2015-11-27 DIAGNOSIS — M961 Postlaminectomy syndrome, not elsewhere classified: Secondary | ICD-10-CM

## 2015-11-27 DIAGNOSIS — E7801 Familial hypercholesterolemia: Secondary | ICD-10-CM | POA: Insufficient documentation

## 2015-11-27 MED ORDER — GABAPENTIN 800 MG PO TABS: 1600.0000 mg | ORAL_TABLET | Freq: Two times a day (BID) | ORAL | 0 refills | Status: DC

## 2015-11-27 MED ORDER — OXYCODONE HCL 10 MG PO TABS: 10.0000 mg | ORAL_TABLET | Freq: Four times a day (QID) | ORAL | 0 refills | Status: DC | PRN

## 2015-11-27 MED ORDER — CYCLOBENZAPRINE HCL 10 MG PO TABS: 10.0000 mg | ORAL_TABLET | Freq: Three times a day (TID) | ORAL | 0 refills | Status: DC | PRN

## 2015-11-27 NOTE — Progress Notes (Signed)
Safety precautions to be maintained throughout the outpatient stay will include: orient to surroundings, keep bed in low position, maintain call bell within reach at all times, provide assistance with transfer out of bed and ambulation.   Pt did not bring medicatins today- Instructed him on policy and need to bring each appt or empty bottle- pt states he is having some memory loss from a fall/concussion in august  Dr Melrose Nakayama increased Nortiptyline to 60m QD

## 2015-11-27 NOTE — Progress Notes (Addendum)
Patient's Name: Kenneth Osborn  MRN: [id number]  Referring Provider: Madelyn Brunner, MD  DOB: 09-23-1955  PCP: Madelyn Brunner, MD  DOS: 11/27/2015  Note by: Kathlen Brunswick. Dossie Arbour, MD  Service setting: Ambulatory outpatient  Specialty: Interventional Pain Management  Location: [facility] (AMB) Pain Management Facility    Patient type: Established   Primary Reason(s) for Visit: Encounter for prescription drug management (Level of risk: moderate) CC: Back Pain (lower)  HPI  Kenneth Osborn is a 60 y.o. year old, male patient, who comes today for an initial evaluation. 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 Beverely Pace at [facility] on 01/04/2013); Long term current use of opiate analgesic; Long term prescription opiate use; Opiate use (60 MME/Day); Opiate dependence ([place]); Encounter for therapeutic drug level monitoring; Neurogenic pain; Neuropathic pain; Thoracic facet syndrome (T8-10); Lumbar facet syndrome (Bilateral); Cervical facet syndrome (right side); Cervical spondylosis; Lumbar spondylosis; Chronic pain of left upper extremity; Chronic cervical radicular pain (left side); Chronic upper back pain; History of thoracic spine surgery (S/P T9-10 IVD spacer); Failed back surgical syndrome; Musculoskeletal pain; Myofascial pain; Chronic left lower extremity pain; Chronic radicular lumbar pain (left L4 dermatomal pain); Coronary artery disease; History of MI  (myocardial infarction) (May 2012); Chronic anticoagulation (on Effient); History of Meniere's disease; Sleep apnea; Type 2 diabetes mellitus with hyperglycemia ([place]); Dysphagia; Encounter for interrogation of neurostimulator; Status post insertion of spinal cord stimulator; Unstable angina ([place]); Controlled type 2 diabetes mellitus without complication ([place]); Cervical pain; Arthralgia of shoulder; CAD (coronary artery disease); Paroxysmal supraventricular tachycardia ([place]); Balance problem; Chronic tension-type headache, not intractable; Dizziness; Post concussion syndrome; Mild cognitive impairment; and 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 His primarily concern today is the Back Pain (lower)  Pain Assessment: Self-Reported Pain Score: 3 /10             Reported level is compatible with observation.       Pain Type: Chronic pain Pain Location: Back Pain Orientation: Mid, Lower Pain Descriptors / Indicators: Nagging, Constant Pain Frequency: Constant  The patient comes into the clinics today for pharmacological management of his chronic pain. I last saw this patient on 10/31/2015. The patient  reports that he does not use drugs. His body mass index is 33.23 kg/m. The patient recently fell and hit his head his head, losing track of time. According to him he lost balance and fell. He has a long-standing history of balance problems with vertigo for which she takes meclizine. He had a right labyrinthectomy done by Dr. Idelle Crouch at [facility] in 1999. He has been on the meclizine since 1993. Since the contusion, he has had problems vocalizing words that he refers he can think about them but he cannot bring up into sentences.  Date of Last Visit: 09/06/15 Service Provided on Last Visit: Med Refill  Controlled Substance Pharmacotherapy Assessment & REMS (Risk Evaluation and Mitigation Strategy)  Analgesic: Oxycodone IR 10 mg every 6 hours (40 mg/day) MME/day: 60  mg/day  Pill Count: Pt did not bring  medicatins today- Instructed him on policy and need to bring each appt or empty bottle- pt states he is having some memory loss from a fall/concussion in august. Pharmacokinetics: Onset of action (Liberation/Absorption): Within expected pharmacological parameters Time to Peak effect (Distribution): Timing and results are as within normal expected parameters Duration of action (Metabolism/Excretion): Within normal limits for medication Pharmacodynamics: Analgesic Effect: More than 50% Activity Facilitation: Medication(s) allow patient to sit, stand, walk, and do the basic ADLs Perceived Effectiveness: Described as relatively effective, allowing for increase in activities of daily living (ADL) Side-effects or Adverse reactions: None reported Monitoring: [place] PMP: Online review of the past 29-monthperiod conducted. Compliant with practice rules and regulations List of all UDS test(s) done:  Lab Results  Component Value Date   TOXASSSELUR FINAL 06/07/2015   TOXASSSELUR FINAL 03/22/2015   TTallmadgeFINAL 12/21/2014   Last UDS on record: ToxAssure Select 13  Date Value Ref Range Status  06/07/2015 FINAL  Final    Comment:    ==================================================================== TOXASSURE SELECT 13 (MW) ==================================================================== Test                             Result       Flag       Units Drug Present and Declared for Prescription Verification   Desmethyldiazepam              229          EXPECTED   ng/mg creat   Oxazepam                       1183         EXPECTED   ng/mg creat   Temazepam                      383          EXPECTED   ng/mg creat    Desmethyldiazepam, oxazepam, and temazepam are expected    metabolites of diazepam. Desmethyldiazepam and oxazepam are also    expected metabolites of other drugs, including chlordiazepoxide,    prazepam, clorazepate, and halazepam. Oxazepam is  an expected    metabolite of temazepam. Oxazepam and temazepam are also    available as scheduled prescription medications.   Oxycodone                      2359         EXPECTED   ng/mg creat   Oxymorphone                    2172         EXPECTED   ng/mg creat   Noroxycodone                   2001         EXPECTED   ng/mg creat    Sources of oxycodone include scheduled prescription medications.    Oxymorphone and noroxycodone are expected metabolites of    oxycodone. Oxymorphone is also available as a scheduled    prescription medication. Drug Absent but Declared for Prescription Verification   Alprazolam                     Not Detected UNEXPECTED ng/mg creat ==================================================================== Test  Result    Flag   Units      Ref Range   Creatinine              75               mg/dL      >=20 ==================================================================== Declared Medications:  The flagging and interpretation on this report are based on the  following declared medications.  Unexpected results may arise from  inaccuracies in the declared medications.  **Note: The testing scope of this panel includes these medications:  Alprazolam (Xanax)  Diazepam (Valium)  Oxycodone  **Note: The testing scope of this panel does not include following  reported medications:  Aspirin (Aspirin 81)  Cetirizine (Zyrtec)  Cyclobenzaprine (Flexeril)  Diltiazem (Cartia XT)  Enalapril (Vasotec)  Ezetimibe (Zetia)  Gabapentin (Neurontin)  Hydrochlorothiazide (Maxzide)  Insulin (Humalog)  Insulin (Lantus)  Magnesium (Mag-Ox)  Meclizine (Antivert)  Metformin (Glucophage)  Metoprolol (Toprol)  Montelukast (Singulair)  Niacin (Niaspan)  Nitroglycerin (Nitrostat)  Omega-3 Fatty Acids (Fish Oil)  Omeprazole (Nexium)  Prasugrel (Effient)  Ranitidine (Zantac)  Rosuvastatin (Crestor)  Triamterene  (Maxzide) ==================================================================== For clinical consultation, please call [phone]. ====================================================================    UDS interpretation: Compliant          Medication Assessment Form: Reviewed. Patient indicates being compliant with therapy Treatment compliance: Compliant Risk Assessment: Aberrant Behavior: None observed today Substance Use Disorder (SUD) Risk Level: No change since last visit Risk of opioid abuse or dependence: 0.7-3.0% with doses ? 36 MME/day and 6.1-26% with doses ? 120 MME/day. Opioid Risk Tool (ORT) Score: 0           Depression Scale Score: PHQ-2: 0           PHQ-9: 0            Pharmacologic Plan: No change in therapy, at this time  Laboratory Chemistry  Inflammation Markers Lab Results  Component Value Date   ESRSEDRATE 9 03/22/2015   CRP 1.4 (H) 03/22/2015   Renal Function Lab Results  Component Value Date   BUN 20 06/16/2015   CREATININE 1.24 06/16/2015   GFRAA >60 06/16/2015   GFRNONAA >60 06/16/2015   Hepatic Function Lab Results  Component Value Date   AST 27 03/22/2015   ALT 34 03/22/2015   ALBUMIN 4.1 03/22/2015   Electrolytes Lab Results  Component Value Date   NA 139 06/16/2015   K 4.7 06/16/2015   CL 107 06/16/2015   CALCIUM 9.5 06/16/2015   MG 1.7 03/22/2015   Pain Modulating Vitamins No results found for: Maralyn Sago [id number], [id number], [id number], 25OHVITD1, 25OHVITD2, 25OHVITD3, VITAMINB12 Coagulation Parameters Lab Results  Component Value Date   INR 1.0 02/23/2014   LABPROT 12.9 02/23/2014   APTT 25.9 08/18/2013   PLT 180 06/16/2015   Cardiovascular Lab Results  Component Value Date   HGB 13.1 06/16/2015   HCT 38.5 (L) 06/16/2015   Note: Lab results reviewed. Recent Diagnostic Imaging  Mr Brain Wo Contrast  Result Date: 11/13/2015 CLINICAL DATA:  2 concussive episodes last month. Headaches. Abnormal balance and vertigo.  Slurred speech and confusion. EXAM: MRI HEAD WITHOUT CONTRAST TECHNIQUE: Multiplanar, multiecho pulse sequences of the brain and surrounding structures were obtained without intravenous contrast. COMPARISON:  CT head without contrast 10/04/2015 FINDINGS: Brain: Mild generalized atrophy is present. No acute infarct, hemorrhage, or mass lesion is present. No significant white matter changes are present. There is no evidence for microhemorrhage. Vascular: Flow is present in the major intracranial arteries. Skull  and upper cervical spine: Skullbase is within normal limits. The internal auditory canals are normal bilaterally. Midline sagittal structures are unremarkable. Sinuses/Orbits: The paranasal sinuses are clear. The globes and orbits are intact. There is minimal fluid posteriorly in the mastoid air cells bilaterally. No obstructing nasopharyngeal lesion is evident. Other: Previously-seen is scalp soft tissue swelling has resolved. IMPRESSION: 1. Stable age advanced atrophy. 2. No acute intracranial abnormality. 3. No evidence for intracranial trauma. Electronically Signed   By: San Morelle M.D.   On: 11/13/2015 16:35   Meds  The patient has a current medication list which includes the following prescription(s): accu-chek aviva plus, alprazolam, aspirin ec, cetirizine, diazepam, diltiazem, enalapril, esomeprazole, ezetimibe, insulin glargine, insulin lispro, insulin lispro prot & lispro, insulin syringe-needle u-100, insulin syringe-needle u-100, magnesium oxide, meclizine, metformin, metoprolol succinate, montelukast, naloxone, niacin, nortriptyline, fish oil, prasugrel, ranitidine, rosuvastatin, triamterene-hydrochlorothiazide, cyclobenzaprine, gabapentin, oxycodone hcl, oxycodone hcl, and oxycodone hcl.  Current Outpatient Prescriptions on File Prior to Visit  Medication Sig  . ACCU-CHEK AVIVA PLUS test strip   . ALPRAZolam (XANAX) 0.5 MG tablet Take 0.5 mg by mouth at bedtime as needed  (Meniere's disease).   Marland Kitchen aspirin EC 81 MG tablet Take 81 mg by mouth at bedtime.   . cetirizine (ZYRTEC) 10 MG tablet Take 10 mg by mouth daily.  . diazepam (VALIUM) 5 MG tablet Take 5 mg by mouth 3 (three) times daily as needed (Meniere's disease).   Marland Kitchen diltiazem (CARTIA XT) 240 MG 24 hr capsule Take 240 mg by mouth at bedtime.  . enalapril (VASOTEC) 10 MG tablet Take 10 mg by mouth 2 (two) times daily.  Marland Kitchen esomeprazole (NEXIUM) 40 MG capsule Take 40 mg by mouth 2 (two) times daily.  Marland Kitchen ezetimibe (ZETIA) 10 MG tablet Take 10 mg by mouth 2 (two) times daily.   . insulin glargine (LANTUS) 100 UNIT/ML injection Inject 100 Units into the skin at bedtime.   . insulin lispro (HUMALOG KWIKPEN) 100 UNIT/ML KiwkPen Inject 30 Units into the skin daily before lunch.   . Insulin Lispro Prot & Lispro (HUMALOG MIX 75/25 KWIKPEN) (75-25) 100 UNIT/ML Kwikpen Inject 60 Units into the skin 2 (two) times daily with a meal.  . Insulin Syringe-Needle U-100 (BD INSULIN SYRINGE ULTRAFINE) 31G X 15/64" 1 ML MISC   . Insulin Syringe-Needle U-100 (BD INSULIN SYRINGE ULTRAFINE) 31G X 5/16" 0.5 ML MISC Use one daily  . magnesium oxide (MAG-OX) 400 MG tablet Take 400 mg by mouth daily.   . meclizine (ANTIVERT) 25 MG tablet Take 25 mg by mouth 3 (three) times daily as needed for dizziness.   . metFORMIN (GLUCOPHAGE) 1000 MG tablet Take 1,000 mg by mouth 2 (two) times daily with a meal.  . metoprolol succinate (TOPROL-XL) 50 MG 24 hr tablet Take 50 mg by mouth 2 (two) times daily. Take with or immediately following a meal.   . montelukast (SINGULAIR) 10 MG tablet Take 10 mg by mouth daily.   . naloxone (NARCAN) 2 MG/2ML injection Inject content of syringe into thigh muscle. Call 911.  . niacin (NIASPAN) 500 MG CR tablet Take 500 mg by mouth at bedtime.  . Omega-3 Fatty Acids (FISH OIL) 1000 MG CAPS Take 1,000 mg by mouth 2 (two) times daily.  . prasugrel (EFFIENT) 10 MG TABS Take 10 mg by mouth daily.  . ranitidine (ZANTAC)  150 MG tablet Take 150 mg by mouth at bedtime.   . rosuvastatin (CRESTOR) 20 MG tablet Take 20 mg by mouth at bedtime.  Marland Kitchen  triamterene-hydrochlorothiazide (MAXZIDE) 75-50 MG tablet Take 1 tablet by mouth daily.    No current facility-administered medications on file prior to visit.    ROS  Constitutional: Denies any fever or chills Gastrointestinal: No reported hemesis, hematochezia, vomiting, or acute GI distress Musculoskeletal: Denies any acute onset joint swelling, redness, loss of ROM, or weakness Neurological: No reported episodes of acute onset apraxia, aphasia, dysarthria, agnosia, amnesia, paralysis, loss of coordination, or loss of consciousness  Allergies  Kenneth Osborn has No Known Allergies.  [place]  Medical:  Kenneth Osborn  has a past medical history of Bronchitis; Chronic back pain; Concussion (09/2015); COPD (chronic obstructive pulmonary disease) ([place]); Coronary artery disease; DDD (degenerative disc disease), cervical; Diabetes mellitus without complication ([place]); Dysphagia; GERD (gastroesophageal reflux disease); History of Meniere's disease (12/21/2014); History of thoracic spine surgery (S/P T9-10 IVD spacer) (12/21/2014); Hypercholesteremia; Hyperlipidemia; Hypertension; Meniere's disease; Myocardial infarction; Neuromuscular disorder ([place]); Psychosis; Short-segment Barrett's esophagus; and Sleep apnea. Family: family history includes Cancer in his sister; Diabetes in his mother; Heart disease in his father, maternal aunt, maternal uncle, and mother. Surgical:  has a past surgical history that includes Cardiovascular stress test; Cardiac catheterization; Coronary angioplasty; Labrinthectomy; mastoid shunt; Appendectomy; Back surgery; Shoulder arthroscopy with subacromial decompression (Left, 04/06/2012); ARTHRODESIS ANTERIOR ANTERIOR CERVICLE SPINE (01/04/2013); Colonoscopy with propofol (N/A, 09/19/2014); Esophagogastroduodenoscopy (N/A, 09/19/2014); Savory dilation (N/A, 09/19/2014); Spinal  cord stimulator implant (Right); Cardiac catheterization (N/A, 06/15/2015); and Cardiac catheterization (N/A, 06/15/2015). Tobacco:  reports that he has never smoked. He has never used smokeless tobacco. Alcohol:  reports that he does not drink alcohol. Drug:  reports that he does not use drugs.  Constitutional Exam  General appearance: Well nourished, well developed, and well hydrated. In no acute distress Vitals:   11/27/15 1300  BP: (!) 143/83  Pulse: (!) 101  Resp: 16  Temp: 97.6 F (36.4 C)  SpO2: 99%  Weight: 225 lb (102.1 kg)  Height: 5' 9"  (1.753 m)  BMI Assessment: Estimated body mass index is 33.23 kg/m as calculated from the following:   Height as of this encounter: 5' 9"  (1.753 m).   Weight as of this encounter: 225 lb (102.1 kg).   BMI interpretation: (30-34.9 kg/m2) = Obese (Class I): This range is associated with a 68% higher incidence of chronic pain. BMI Readings from Last 4 Encounters:  11/27/15 33.23 kg/m  09/06/15 32.49 kg/m  06/15/15 33.52 kg/m  06/14/15 33.52 kg/m   Wt Readings from Last 4 Encounters:  11/27/15 225 lb (102.1 kg)  09/06/15 220 lb (99.8 kg)  06/15/15 227 lb (103 kg)  06/14/15 227 lb (103 kg)  Psych/Mental status: Alert and oriented x 3 (person, place, & time) Eyes: PERLA Respiratory: No evidence of acute respiratory distress  Cervical Spine Exam  Inspection: No masses, redness, or swelling Alignment: Symmetrical Functional ROM: Unrestricted ROM Stability: No instability detected Muscle strength & Tone: Functionally intact Sensory: Unimpaired Palpation: Non-contributory  Upper Extremity (UE) Exam    Side: Right upper extremity  Side: Left upper extremity  Inspection: No masses, redness, swelling, or asymmetry  Inspection: No masses, redness, swelling, or asymmetry  Functional ROM: Unrestricted ROM         Functional ROM: Unrestricted ROM          Muscle strength & Tone: Functionally intact  Muscle strength & Tone: Functionally  intact  Sensory: Unimpaired  Sensory: Unimpaired  Palpation: Non-contributory  Palpation: Non-contributory   Thoracic Spine Exam  Inspection: No masses, redness, or swelling Alignment: Symmetrical Functional ROM: Unrestricted  ROM Stability: No instability detected Sensory: Unimpaired Muscle strength & Tone: Functionally intact Palpation: Non-contributory  Lumbar Spine Exam  Inspection: No masses, redness, or swelling Alignment: Symmetrical Functional ROM: Unrestricted ROM Stability: No instability detected Muscle strength & Tone: Functionally intact Sensory: Unimpaired Palpation: Non-contributory Provocative Tests: Lumbar Hyperextension and rotation test: evaluation deferred today       Patrick's Maneuver: evaluation deferred today              Gait & Posture Assessment  Ambulation: Unassisted Gait: Relatively normal for age and body habitus Posture: WNL   Lower Extremity Exam    Side: Right lower extremity  Side: Left lower extremity  Inspection: No masses, redness, swelling, or asymmetry  Inspection: No masses, redness, swelling, or asymmetry  Functional ROM: Unrestricted ROM          Functional ROM: Unrestricted ROM          Muscle strength & Tone: Functionally intact  Muscle strength & Tone: Functionally intact  Sensory: Unimpaired  Sensory: Unimpaired  Palpation: Non-contributory  Palpation: Non-contributory   Assessment  Primary Diagnosis & Pertinent Problem List: The primary encounter diagnosis was Long term current use of opiate analgesic. 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  Visit Diagnosis: 1. Long term current use of opiate analgesic   2. Chronic pain syndrome   3. Musculoskeletal  pain   4. Neuropathic pain   5. Opiate use (60 MME/Day)   6. Diabetic polyneuropathy associated with diabetes mellitus due to underlying condition ([place])   7. Failed back surgical syndrome   8. 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 Oxycodone HCl 10 MG TABS    Sig: Take 1 tablet (10 mg total) by mouth every 6 (six) hours as needed.    Dispense:  120 tablet    Refill:  0    Do not place this medication, or any other prescription from our practice, on "Automatic Refill". Patient may have prescription filled one day early if pharmacy is closed on scheduled refill date. Do not fill until: 12/17/15 To last until: 01/16/16  . Oxycodone HCl 10 MG TABS    Sig: Take 1 tablet (10 mg total) by mouth every 6 (six) hours as needed.    Dispense:  120 tablet    Refill:  0    Do not place this medication, or any other prescription from our practice, on "Automatic Refill". Patient may have prescription filled one day early if pharmacy is closed on scheduled refill date. Do not fill until: 01/16/16 To last until: 02/15/16  . Oxycodone HCl 10 MG TABS    Sig: Take 1 tablet (10 mg total) by mouth every 6 (six) hours as needed.    Dispense:  120 tablet    Refill:  0    Do not place this medication, or any other prescription from our practice, on "Automatic Refill". Patient may have prescription filled one day early if pharmacy is closed on scheduled refill date. Do not fill until: 02/15/16 To last until: 03/16/16  . cyclobenzaprine (FLEXERIL) 10 MG tablet    Sig: Take 1 tablet (10 mg total) by mouth every 8 (eight) hours as needed for muscle  spasms.    Dispense:  270 tablet    Refill:  0    3 Month refill. Do not add this medication to the electronic "Automatic Refill" notification system. Patient may have prescription filled one day early if pharmacy is closed on scheduled refill date.   . gabapentin (NEURONTIN) 800 MG tablet    Sig: Take 2 tablets (1,600 mg total) by mouth 2 (two) times daily.    Dispense:  360 tablet    Refill:  0    Do not place this medication, or any other prescription from our practice, on "Automatic Refill". Patient may have prescription filled one day early if pharmacy is closed on scheduled refill date. (3 mo refill)   New Prescriptions   No medications on file   Medications administered during this visit: Kenneth Osborn had no medications administered during this visit. Lab-work, Procedure(s), & Referral(s) Ordered: Orders Placed This Encounter  Procedures  . ToxASSURE Select 13 (MW), Urine   Imaging & Referral(s) Ordered: None  Interventional Therapies: Scheduled:  None at this time.    Considering:  None at this time.    PRN Procedures:  None at this time.    Requested PM Follow-up: Return in about 3 months (around 02/27/2016) for Med-Mgmt.  Future Appointments Date Time Provider [place]  02/29/2016 8:15 AM Milinda Pointer, MD [facility] None   Primary Care Physician: Madelyn Brunner, MD Location: [facility] Management Facility Note by: Kathlen Brunswick Dossie Arbour, M.D, DABA, DABAPM, DABPM, DABIPP, FIPP  Pain Score Disclaimer: We use the NRS-11 scale. This is a self-reported, subjective measurement of pain severity with only modest accuracy. It is used primarily to identify changes within a particular patient. It must be understood that outpatient pain scales are significantly less accurate that those used for research, where they can be applied under ideal controlled circumstances with minimal exposure to variables. In reality, the score is likely to be a combination of pain intensity and pain affect, where pain affect describes the degree of emotional arousal or changes in action readiness caused by the sensory experience of pain. Factors such as social and work situation, setting, emotional state, anxiety levels,  expectation, and prior pain experience may influence pain perception and show large inter-individual differences that may also be affected by time variables.  Patient instructions provided during this appointment: Patient Instructions  Pain Management Discharge Instructions  General Discharge Instructions :  If you need to reach your doctor call: Monday-Friday 8:00 am - 4:00 pm at [phone] or toll free [phone].  After clinic hours [phone] to have operator reach doctor.  Bring all of your medication bottles to all your appointments in the pain clinic.  To cancel or reschedule your appointment with Pain Management please remember to call 24 hours in advance to avoid a fee.  Refer to the educational materials which you have been given on: General Risks, I had my Procedure. Discharge Instructions, Post Sedation.  Post Procedure Instructions:  The drugs you were given will stay in your system until tomorrow, so for the next 24 hours you should not drive, make any legal decisions or drink any alcoholic beverages.  You may eat anything you prefer, but it is better to start with liquids then soups and crackers, and gradually work up to solid foods.  Please notify your doctor immediately if you have any unusual bleeding, trouble breathing or pain that is not related to your normal pain.  Depending on the type of procedure that was  done, some parts of your body may feel week and/or numb.  This usually clears up by tonight or the next day.  Walk with the use of an assistive device or accompanied by an adult for the 24 hours.  You may use ice on the affected area for the first 24 hours.  Put ice in a Ziploc bag and cover with a towel and place against area 15 minutes on 15 minutes off.  You may switch to heat after 24 hours.

## 2015-11-27 NOTE — Patient Instructions (Signed)

## 2015-12-02 LAB — TOXASSURE SELECT 13 (MW), URINE

## 2016-01-24 DIAGNOSIS — I6523 Occlusion and stenosis of bilateral carotid arteries: Secondary | ICD-10-CM | POA: Insufficient documentation

## 2016-02-29 ENCOUNTER — Ambulatory Visit: Payer: Medicare Other | Attending: Pain Medicine | Admitting: Pain Medicine

## 2016-02-29 ENCOUNTER — Encounter: Payer: Self-pay | Admitting: Pain Medicine

## 2016-02-29 VITALS — BP 134/77 | HR 96 | Temp 99.3°F | Resp 18 | Ht 69.0 in | Wt 225.0 lb

## 2016-02-29 DIAGNOSIS — K219 Gastro-esophageal reflux disease without esophagitis: Secondary | ICD-10-CM | POA: Diagnosis not present

## 2016-02-29 DIAGNOSIS — M792 Neuralgia and neuritis, unspecified: Secondary | ICD-10-CM | POA: Diagnosis not present

## 2016-02-29 DIAGNOSIS — M1288 Other specific arthropathies, not elsewhere classified, other specified site: Secondary | ICD-10-CM

## 2016-02-29 DIAGNOSIS — E1142 Type 2 diabetes mellitus with diabetic polyneuropathy: Secondary | ICD-10-CM | POA: Diagnosis not present

## 2016-02-29 DIAGNOSIS — E0842 Diabetes mellitus due to underlying condition with diabetic polyneuropathy: Secondary | ICD-10-CM | POA: Diagnosis not present

## 2016-02-29 DIAGNOSIS — G3184 Mild cognitive impairment, so stated: Secondary | ICD-10-CM | POA: Insufficient documentation

## 2016-02-29 DIAGNOSIS — M488X4 Other specified spondylopathies, thoracic region: Secondary | ICD-10-CM | POA: Diagnosis not present

## 2016-02-29 DIAGNOSIS — Z7982 Long term (current) use of aspirin: Secondary | ICD-10-CM | POA: Insufficient documentation

## 2016-02-29 DIAGNOSIS — M25519 Pain in unspecified shoulder: Secondary | ICD-10-CM | POA: Insufficient documentation

## 2016-02-29 DIAGNOSIS — I2510 Atherosclerotic heart disease of native coronary artery without angina pectoris: Secondary | ICD-10-CM | POA: Diagnosis not present

## 2016-02-29 DIAGNOSIS — M791 Myalgia: Secondary | ICD-10-CM | POA: Insufficient documentation

## 2016-02-29 DIAGNOSIS — E1122 Type 2 diabetes mellitus with diabetic chronic kidney disease: Secondary | ICD-10-CM | POA: Diagnosis not present

## 2016-02-29 DIAGNOSIS — G894 Chronic pain syndrome: Secondary | ICD-10-CM | POA: Diagnosis not present

## 2016-02-29 DIAGNOSIS — M47816 Spondylosis without myelopathy or radiculopathy, lumbar region: Secondary | ICD-10-CM | POA: Insufficient documentation

## 2016-02-29 DIAGNOSIS — Z794 Long term (current) use of insulin: Secondary | ICD-10-CM | POA: Insufficient documentation

## 2016-02-29 DIAGNOSIS — M7918 Myalgia, other site: Secondary | ICD-10-CM

## 2016-02-29 DIAGNOSIS — G709 Myoneural disorder, unspecified: Secondary | ICD-10-CM | POA: Insufficient documentation

## 2016-02-29 DIAGNOSIS — Z79899 Other long term (current) drug therapy: Secondary | ICD-10-CM | POA: Diagnosis not present

## 2016-02-29 DIAGNOSIS — I6523 Occlusion and stenosis of bilateral carotid arteries: Secondary | ICD-10-CM | POA: Insufficient documentation

## 2016-02-29 DIAGNOSIS — M5412 Radiculopathy, cervical region: Secondary | ICD-10-CM

## 2016-02-29 DIAGNOSIS — Z981 Arthrodesis status: Secondary | ICD-10-CM | POA: Insufficient documentation

## 2016-02-29 DIAGNOSIS — Z833 Family history of diabetes mellitus: Secondary | ICD-10-CM | POA: Diagnosis not present

## 2016-02-29 DIAGNOSIS — M47894 Other spondylosis, thoracic region: Secondary | ICD-10-CM

## 2016-02-29 DIAGNOSIS — G4730 Sleep apnea, unspecified: Secondary | ICD-10-CM | POA: Insufficient documentation

## 2016-02-29 DIAGNOSIS — J449 Chronic obstructive pulmonary disease, unspecified: Secondary | ICD-10-CM | POA: Diagnosis not present

## 2016-02-29 DIAGNOSIS — M488X2 Other specified spondylopathies, cervical region: Secondary | ICD-10-CM | POA: Diagnosis not present

## 2016-02-29 DIAGNOSIS — M961 Postlaminectomy syndrome, not elsewhere classified: Secondary | ICD-10-CM

## 2016-02-29 DIAGNOSIS — Z8249 Family history of ischemic heart disease and other diseases of the circulatory system: Secondary | ICD-10-CM | POA: Insufficient documentation

## 2016-02-29 DIAGNOSIS — M5010 Cervical disc disorder with radiculopathy, unspecified cervical region: Secondary | ICD-10-CM | POA: Insufficient documentation

## 2016-02-29 DIAGNOSIS — M47812 Spondylosis without myelopathy or radiculopathy, cervical region: Secondary | ICD-10-CM

## 2016-02-29 DIAGNOSIS — M4802 Spinal stenosis, cervical region: Secondary | ICD-10-CM | POA: Insufficient documentation

## 2016-02-29 DIAGNOSIS — Z5181 Encounter for therapeutic drug level monitoring: Secondary | ICD-10-CM | POA: Insufficient documentation

## 2016-02-29 DIAGNOSIS — N183 Chronic kidney disease, stage 3 (moderate): Secondary | ICD-10-CM | POA: Insufficient documentation

## 2016-02-29 DIAGNOSIS — E785 Hyperlipidemia, unspecified: Secondary | ICD-10-CM | POA: Diagnosis not present

## 2016-02-29 DIAGNOSIS — G8929 Other chronic pain: Secondary | ICD-10-CM

## 2016-02-29 DIAGNOSIS — M47814 Spondylosis without myelopathy or radiculopathy, thoracic region: Secondary | ICD-10-CM

## 2016-02-29 DIAGNOSIS — M545 Low back pain: Secondary | ICD-10-CM | POA: Diagnosis present

## 2016-02-29 DIAGNOSIS — I252 Old myocardial infarction: Secondary | ICD-10-CM | POA: Insufficient documentation

## 2016-02-29 DIAGNOSIS — M4722 Other spondylosis with radiculopathy, cervical region: Secondary | ICD-10-CM | POA: Diagnosis not present

## 2016-02-29 DIAGNOSIS — Z79891 Long term (current) use of opiate analgesic: Secondary | ICD-10-CM | POA: Insufficient documentation

## 2016-02-29 DIAGNOSIS — I129 Hypertensive chronic kidney disease with stage 1 through stage 4 chronic kidney disease, or unspecified chronic kidney disease: Secondary | ICD-10-CM | POA: Diagnosis not present

## 2016-02-29 MED ORDER — CYCLOBENZAPRINE HCL 10 MG PO TABS: 10.0000 mg | ORAL_TABLET | Freq: Three times a day (TID) | ORAL | 0 refills | Status: DC | PRN

## 2016-02-29 MED ORDER — OXYCODONE HCL 10 MG PO TABS: 10.0000 mg | ORAL_TABLET | Freq: Four times a day (QID) | ORAL | 0 refills | Status: DC | PRN

## 2016-02-29 MED ORDER — GABAPENTIN 800 MG PO TABS: 1600.0000 mg | ORAL_TABLET | Freq: Two times a day (BID) | ORAL | 0 refills | Status: DC

## 2016-02-29 NOTE — Progress Notes (Signed)
Nursing Pain Medication Assessment:  Safety precautions to be maintained throughout the outpatient stay will include: orient to surroundings, keep bed in low position, maintain call bell within reach at all times, provide assistance with transfer out of bed and ambulation.  Medication Inspection Compliance: Pill count conducted under aseptic conditions, in front of the patient. Neither the pills nor the bottle was removed from the patient's sight at any time. Once count was completed pills were immediately returned to the patient in their original bottle.  Medication: Oxycodone IR Pill Count: 79 of 120 pills remain Bottle Appearance: Standard pharmacy container. Clearly labeled. Filled Date: 62 / 30 / 2017 Medication last intake: 02-29-16 at 0730

## 2016-02-29 NOTE — Progress Notes (Signed)
Patient's Name: Kenneth Osborn  MRN: [id number]  Referring Provider: Madelyn Brunner, MD  DOB: [birth year]  PCP: Madelyn Brunner, MD  DOS: 02/29/2016  Note by: Kathlen Brunswick. Dossie Arbour, MD  Service setting: Ambulatory outpatient  Specialty: Interventional Pain Management  Location: [facility] (AMB) Pain Management Facility    Patient type: Established   Primary Reason(s) for Visit: Encounter for prescription drug management (Level of risk: moderate) CC: Back Pain (low) and Foot Pain (mostly the right)  HPI  Kenneth Osborn is a 61 y.o. year old, male patient, who comes today for a medication management evaluation. 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 Beverely Pace at [facility] on 01/04/2013); Long term current use of opiate analgesic; Long term prescription opiate use; Opiate use (60 MME/Day); Opiate dependence ([place]); Encounter for therapeutic drug level monitoring; Neurogenic pain; Neuropathic pain; Thoracic facet syndrome (T8-10); Lumbar facet syndrome (Bilateral); Cervical facet syndrome (right side); Cervical spondylosis; Lumbar spondylosis; Chronic pain of left upper extremity; Chronic cervical radicular pain (left side); Chronic upper back pain; History of thoracic spine surgery (S/P T9-10 IVD spacer); Failed back surgical syndrome; Musculoskeletal pain; Myofascial pain; Chronic left lower extremity pain; Chronic radicular lumbar pain (left L4 dermatomal pain); Coronary artery disease; History of  MI (myocardial infarction) (May 2012); Chronic anticoagulation (on [place]); History of Meniere's disease; Sleep apnea; Type 2 diabetes mellitus with hyperglycemia ([place]); Dysphagia; Encounter for interrogation of neurostimulator; Status post insertion of spinal cord stimulator; Unstable angina ([place]); Controlled type 2 diabetes mellitus without complication ([place]); Arthralgia of shoulder; CAD (coronary artery disease); Paroxysmal supraventricular tachycardia ([place]); Balance problem; Chronic tension-type headache, not intractable; Dizziness; Post concussion syndrome; Mild cognitive impairment; 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 His primarily concern today is the Back Pain (low) and Foot Pain (mostly the right)  Pain Assessment: Self-Reported Pain Score: 4 /10 Clinically the patient looks like a 1/10 Reported level is inconsistent with clinical observations. Information on the proper use of the pain score provided to the patient today. Pain Location: Back Pain Orientation: Lower Pain Descriptors / Indicators: Tightness, Other (Comment) (twisting knot-like pain)  Kenneth Osborn was last seen on 11/27/2015 for medication management. During today's appointment we reviewed Kenneth Osborn's chronic pain status, as well as his outpatient medication regimen.  The patient  reports that he does not use drugs. His body mass index is 33.23 kg/m.  Further details on both, my assessment(s), as well as the proposed treatment plan, please see below.  Controlled Substance Pharmacotherapy Assessment REMS (Risk Evaluation and Mitigation Strategy)  Analgesic:Oxycodone IR 10 mg every 6 hours (40 mg/day) MME/day:60 mg/day  Kenneth Martins, RN  02/29/2016 12:46 PM  Sign at close encounter Nursing Pain Medication Assessment:  Safety precautions to be maintained throughout the outpatient stay will include: orient to surroundings, keep  bed in low position, maintain call bell within reach at all times, provide assistance with transfer out of bed and ambulation.  Medication Inspection Compliance: Pill count conducted under aseptic conditions, in front of the patient.  Neither the pills nor the bottle was removed from the patient's sight at any time. Once count was completed pills were immediately returned to the patient in their original bottle.  Medication: Oxycodone IR Pill Count: 79 of 120 pills remain Bottle Appearance: Standard pharmacy container. Clearly labeled. Filled Date: 55 / 30 / 2017 Medication last intake: 02-29-16 at [address], [place]  02/29/2016  8:54 AM  Sign at close encounter Nursing Pain Medication Assessment:  Safety precautions to be maintained throughout the outpatient stay will include: orient to surroundings, keep bed in low position, maintain call bell within reach at all times, provide assistance with transfer out of bed and ambulation.  Medication Inspection Compliance: Kenneth Osborn did not comply with our request to bring his pills to be counted. He was reminded that bringing the medication bottles, even when empty, is a requirement. Pill Count: No pills available to be counted today. Bottle Appearance: No container available. Did not bring bottle(s) to appointment. Medication: None brought in. Filled Date: N/A Medication last intake: 02-29-2016 at 0800   Pharmacokinetics: Liberation and absorption (onset of action): WNL Distribution (time to peak effect): WNL Metabolism and excretion (duration of action): WNL         Pharmacodynamics: Desired effects: Analgesia: Kenneth Osborn reports >50% benefit. Functional ability: Patient reports that medication allows him to accomplish basic ADLs Clinically meaningful improvement in function (CMIF): Sustained CMIF goals met Perceived effectiveness: Described as relatively effective, allowing for increase in activities of daily living (ADL) Undesirable  effects: Side-effects or Adverse reactions: None reported Monitoring: [place] PMP: Online review of the past 62-monthperiod conducted. Compliant with practice rules and regulations List of all UDS test(s) done:  Lab Results  Component Value Date   TOXASSSELUR FINAL 11/27/2015   [place]AL 06/07/2015   TS[place]FINAL 03/22/2015   TS[place]FINAL 12/21/2014   Last UDS on record: ToxAssure Select 13  Date Value Ref Range Status  11/27/2015 FINAL  Final    Comment:    ==================================================================== TOXASSURE SELECT 13 (MW) ==================================================================== Test                             Result       Flag       Units Drug Present and Declared for Prescription Verification   Desmethyldiazepam              271          EXPECTED   ng/mg creat   Oxazepam                       872          EXPECTED   ng/mg creat   Temazepam                      484          EXPECTED   ng/mg creat    Desmethyldiazepam, oxazepam, and temazepam are expected    metabolites of diazepam. Desmethyldiazepam and oxazepam are also    expected metabolites of other drugs, including chlordiazepoxide,    prazepam, clorazepate, and halazepam. Oxazepam is an expected    metabolite of temazepam. Oxazepam and temazepam are also    available as scheduled prescription medications.   Oxycodone                      1232  EXPECTED   ng/mg creat   Oxymorphone                    2523         EXPECTED   ng/mg creat   Noroxycodone                   1120         EXPECTED   ng/mg creat   Noroxymorphone                 961          EXPECTED   ng/mg creat    Sources of oxycodone are scheduled prescription medications.    Oxymorphone, noroxycodone, and noroxymorphone are expected    metabolites of oxycodone. Oxymorphone is also available as a    scheduled prescription medication. Drug Absent but Declared for Prescription Verification   Alprazolam                      Not Detected UNEXPECTED ng/mg creat ==================================================================== Test                      Result    Flag   Units      Ref Range   Creatinine              120              mg/dL      >=20 ==================================================================== Declared Medications:  The flagging and interpretation on this report are based on the  following declared medications.  Unexpected results may arise from  inaccuracies in the declared medications.  **Note: The testing scope of this panel includes these medications:  Alprazolam (Xanax)  Diazepam (Valium)  Oxycodone  **Note: The testing scope of this panel does not include following  reported medications:  Aspirin  Cetirizine (Zyrtec)  Cyclobenzaprine (Flexeril)  Diltiazem (Cardizem CD)  Enalapril (Vasotec)  Ezetimibe (Zetia)  Gabapentin  Hydrochlorothiazide (Maxzide)  Insulin (Humalog)  Magnesium Oxide  Meclizine (Antivert)  Metformin  Metoprolol (Toprol)  Montelukast (Singulair)  Naloxone (Narcan)  Nortriptyline (Pamelor)  Omega-3 Fatty Acids (Fish Oil)  Omeprazole (Nexium)  Prasugrel (Effient)  Ranitidine (Zantac)  Rosuvastatin (Crestor)  Triamterene (Maxzide) ==================================================================== For clinical consultation, please call [phone]. ====================================================================    UDS interpretation: Compliant          Medication Assessment Form: Reviewed. Patient indicates being compliant with therapy Treatment compliance: Compliant Risk Assessment Profile: Aberrant behavior: See prior evaluations. None observed or detected today Comorbid factors increasing risk of overdose: See prior notes. No additional risks detected today Risk of substance use disorder (SUD): Low Opioid Risk Tool (ORT) Total Score:    Interpretation Table:  Score <3 = Low Risk for SUD  Score between 4-7 =  Moderate Risk for SUD  Score >8 = High Risk for Opioid Abuse   Risk Mitigation Strategies:  Patient Counseling: Covered Patient-Prescriber Agreement (PPA): Present and active  Notification to other healthcare providers: Done  Pharmacologic Plan: No change in therapy, at this time  Laboratory Chemistry  Inflammation Markers Lab Results  Component Value Date   ESRSEDRATE 9 03/22/2015   CRP 1.4 (H) 03/22/2015   Renal Function Lab Results  Component Value Date   BUN 20 06/16/2015   CREATININE 1.24 06/16/2015   GFRAA >60 06/16/2015   GFRNONAA >60 06/16/2015   Hepatic Function Lab Results  Component Value Date   AST 27 03/22/2015  ALT 34 03/22/2015   ALBUMIN 4.1 03/22/2015   Electrolytes Lab Results  Component Value Date   NA 139 06/16/2015   K 4.7 06/16/2015   CL 107 06/16/2015   CALCIUM 9.5 06/16/2015   MG 1.7 03/22/2015   Pain Modulating Vitamins No results found for: Maralyn Sago [id number], [id number], [id number], 25OHVITD1, 25OHVITD2, 25OHVITD3, VITAMINB12 Coagulation Parameters Lab Results  Component Value Date   INR 1.0 02/23/2014   LABPROT 12.9 02/23/2014   APTT 25.9 08/18/2013   PLT 180 06/16/2015   Cardiovascular Lab Results  Component Value Date   HGB 13.1 06/16/2015   HCT 38.5 (L) 06/16/2015   Note: Lab results reviewed.  Recent Diagnostic Imaging Review  Mr Brain Wo Contrast  Result Date: 11/13/2015 CLINICAL DATA:  2 concussive episodes last month. Headaches. Abnormal balance and vertigo. Slurred speech and confusion. EXAM: MRI HEAD WITHOUT CONTRAST TECHNIQUE: Multiplanar, multiecho pulse sequences of the brain and surrounding structures were obtained without intravenous contrast. COMPARISON:  CT head without contrast 10/04/2015 FINDINGS: Brain: Mild generalized atrophy is present. No acute infarct, hemorrhage, or mass lesion is present. No significant white matter changes are present. There is no evidence for microhemorrhage. Vascular: Flow is present  in the major intracranial arteries. Skull and upper cervical spine: Skullbase is within normal limits. The internal auditory canals are normal bilaterally. Midline sagittal structures are unremarkable. Sinuses/Orbits: The paranasal sinuses are clear. The globes and orbits are intact. There is minimal fluid posteriorly in the mastoid air cells bilaterally. No obstructing nasopharyngeal lesion is evident. Other: Previously-seen is scalp soft tissue swelling has resolved. IMPRESSION: 1. Stable age advanced atrophy. 2. No acute intracranial abnormality. 3. No evidence for intracranial trauma. Electronically Signed   By: San Morelle M.D.   On: 11/13/2015 16:35   Note: Imaging results reviewed.          Meds  The patient has a current medication list which includes the following prescription(s): accu-chek aviva plus, alprazolam, aspirin ec, cetirizine, cyclobenzaprine, diazepam, diltiazem, enalapril, esomeprazole, ezetimibe, gabapentin, insulin glargine, insulin lispro, insulin lispro prot & lispro, insulin syringe-needle u-100, insulin syringe-needle u-100, magnesium oxide, meclizine, metformin, methylprednisolone, metoprolol succinate, naloxone, niacin, nortriptyline, fish oil, oxycodone hcl, oxycodone hcl, oxycodone hcl, prasugrel, ranitidine, rosuvastatin, triamterene-hydrochlorothiazide, and montelukast.  Current Outpatient Prescriptions on File Prior to Visit  Medication Sig  . ACCU-CHEK AVIVA PLUS test strip   . ALPRAZolam (XANAX) 0.5 MG tablet Take 0.5 mg by mouth at bedtime as needed (Meniere's disease).   Marland Kitchen aspirin EC 81 MG tablet Take 81 mg by mouth at bedtime.   . cetirizine (ZYRTEC) 10 MG tablet Take 10 mg by mouth daily.  . diazepam (VALIUM) 5 MG tablet Take 5 mg by mouth 3 (three) times daily as needed (Meniere's disease).   Marland Kitchen diltiazem (CARTIA XT) 240 MG 24 hr capsule Take 240 mg by mouth at bedtime.  . enalapril (VASOTEC) 10 MG tablet Take 10 mg by mouth 2 (two) times daily.  Marland Kitchen  esomeprazole (NEXIUM) 40 MG capsule Take 40 mg by mouth 2 (two) times daily.  Marland Kitchen ezetimibe (ZETIA) 10 MG tablet Take 10 mg by mouth 2 (two) times daily.   . insulin glargine (LANTUS) 100 UNIT/ML injection Inject 100 Units into the skin at bedtime.   . insulin lispro (HUMALOG KWIKPEN) 100 UNIT/ML KiwkPen Inject 30 Units into the skin daily before lunch.   . Insulin Lispro Prot & Lispro (HUMALOG MIX 75/25 KWIKPEN) (75-25) 100 UNIT/ML Kwikpen Inject 60 Units into the skin 2 (two) times daily  with a meal.  . Insulin Syringe-Needle U-100 (BD INSULIN SYRINGE ULTRAFINE) 31G X 15/64" 1 ML MISC   . Insulin Syringe-Needle U-100 (BD INSULIN SYRINGE ULTRAFINE) 31G X 5/16" 0.5 ML MISC Use one daily  . magnesium oxide (MAG-OX) 400 MG tablet Take 400 mg by mouth daily.   . meclizine (ANTIVERT) 25 MG tablet Take 25 mg by mouth 3 (three) times daily as needed for dizziness.   . metFORMIN (GLUCOPHAGE) 1000 MG tablet Take 1,000 mg by mouth 2 (two) times daily with a meal.  . metoprolol succinate (TOPROL-XL) 50 MG 24 hr tablet Take 50 mg by mouth 2 (two) times daily. Take with or immediately following a meal.   . naloxone (NARCAN) 2 MG/2ML injection Inject content of syringe into thigh muscle. Call 911.  . niacin (NIASPAN) 500 MG CR tablet Take 500 mg by mouth at bedtime.  . nortriptyline (PAMELOR) 50 MG capsule Take 50 mg by mouth at bedtime.  . Omega-3 Fatty Acids (FISH OIL) 1000 MG CAPS Take 1,000 mg by mouth 2 (two) times daily.  . prasugrel (EFFIENT) 10 MG TABS Take 10 mg by mouth daily.  . ranitidine (ZANTAC) 150 MG tablet Take 150 mg by mouth at bedtime.   . rosuvastatin (CRESTOR) 20 MG tablet Take 20 mg by mouth at bedtime.  . triamterene-hydrochlorothiazide (MAXZIDE) 75-50 MG tablet Take 1 tablet by mouth daily.   . montelukast (SINGULAIR) 10 MG tablet Take 10 mg by mouth daily.    No current facility-administered medications on file prior to visit.    ROS  Constitutional: Denies any fever or  chills Gastrointestinal: No reported hemesis, hematochezia, vomiting, or acute GI distress Musculoskeletal: Denies any acute onset joint swelling, redness, loss of ROM, or weakness Neurological: No reported episodes of acute onset apraxia, aphasia, dysarthria, agnosia, amnesia, paralysis, loss of coordination, or loss of consciousness  Allergies  Mr. Acevedo has No Known Allergies.  PFSH  Drug: Mr. Minus  reports that he does not use drugs. Alcohol:  reports that he does not drink alcohol. Tobacco:  reports that he has never smoked. He has never used smokeless tobacco. Medical:  has a past medical history of Allergic rhinitis (12/30/2012); Bronchitis; Can't get food down (08/12/2014); Chronic back pain; Concussion (09/2015); COPD (chronic obstructive pulmonary disease) ([place]); Coronary artery disease; DDD (degenerative disc disease), cervical; Diabetes mellitus without complication ([place]); Dysphagia; GERD (gastroesophageal reflux disease); History of Meniere's disease (12/21/2014); History of thoracic spine surgery (S/P T9-10 IVD spacer) (12/21/2014); Hypercholesteremia; Hyperlipidemia; Hypertension; Meniere's disease; Myocardial infarction; Neuromuscular disorder ([place]); Psychosis; Short-segment Barrett's esophagus; and Sleep apnea. Family: family history includes Cancer in his sister; Diabetes in his mother; Heart disease in his father, maternal aunt, maternal uncle, and mother.  Past Surgical History:  Procedure Laterality Date  . APPENDECTOMY    . ARTHRODESIS ANTERIOR ANTERIOR CERVICLE SPINE  01/04/2013  . BACK SURGERY     fusion thoracic area  . CARDIAC CATHETERIZATION     may 2012 and Nov 20, 2010  . CARDIAC CATHETERIZATION N/A 06/15/2015   Procedure: Left Heart Cath and Coronary Angiography;  Surgeon: Corey Skains, MD;  Location: [place] [facility];  Service: Cardiovascular;  Laterality: N/A;  . CARDIAC CATHETERIZATION N/A 06/15/2015   Procedure: Coronary Stent Intervention;  Surgeon:  Isaias Cowman, MD;  Location: [place] [facility];  Service: Cardiovascular;  Laterality: N/A;  . CARDIOVASCULAR STRESS TEST     jan 2014  . COLONOSCOPY WITH PROPOFOL N/A 09/19/2014   Procedure: COLONOSCOPY WITH PROPOFOL;  Surgeon: Manya Silvas, MD;  Location: [facility] ENDOSCOPY;  Service: Endoscopy;  Laterality: N/A;  . CORONARY ANGIOPLASTY     stent placement  . ESOPHAGOGASTRODUODENOSCOPY N/A 09/19/2014   Procedure: ESOPHAGOGASTRODUODENOSCOPY (EGD);  Surgeon: Manya Silvas, MD;  Location: [facility] ENDOSCOPY;  Service: Endoscopy;  Laterality: N/A;  . [place] right ear  . mastoid shunt     left, 2002, 1997 right ear, 1980 right ear  . SAVORY DILATION N/A 09/19/2014   Procedure: SAVORY DILATION;  Surgeon: Manya Silvas, MD;  Location: [facility] ENDOSCOPY;  Service: Endoscopy;  Laterality: N/A;  . SHOULDER ARTHROSCOPY WITH SUBACROMIAL DECOMPRESSION Left 04/06/2012   Procedure: SHOULDER ARTHROSCOPY WITH SUBACROMIAL DECOMPRESSION;  Surgeon: Vickey Huger, MD;  Location: [place];  Service: Orthopedics;  Laterality: Left;  left shoulder arthroscopy, subacromial decompression and distal clavicle resection  . SPINAL CORD STIMULATOR IMPLANT Right    Constitutional Exam  General appearance: Well nourished, well developed, and well hydrated. In no apparent acute distress Vitals:   02/29/16 0844  BP: 134/77  Pulse: 96  Resp: 18  Temp: 99.3 F (37.4 C)  TempSrc: Oral  SpO2: 98%  Weight: 225 lb (102.1 kg)  Height: _0  (1.753 m)   BMI Assessment: Estimated body mass index is 33.23 kg/m as calculated from the following:   Height as of this encounter: _1  (1.753 m).   Weight as of this encounter: 225 lb (102.1 kg).  BMI interpretation table: BMI level Category Range association with higher incidence of chronic pain  <18 kg/m2 Underweight   18.5-24.9 kg/m2 Ideal body weight   25-29.9 kg/m2 Overweight Increased incidence by 20%  30-34.9 kg/m2 Obese (Class I) Increased incidence  by 68%  35-39.9 kg/m2 Severe obesity (Class II) Increased incidence by 136%  >40 kg/m2 Extreme obesity (Class III) Increased incidence by 254%   BMI Readings from Last 4 Encounters:  02/29/16 33.23 kg/m  11/27/15 33.23 kg/m  09/06/15 32.49 kg/m  06/15/15 33.52 kg/m   Wt Readings from Last 4 Encounters:  02/29/16 225 lb (102.1 kg)  11/27/15 225 lb (102.1 kg)  09/06/15 220 lb (99.8 kg)  06/15/15 227 lb (103 kg)  Psych/Mental status: Alert, oriented x 3 (person, place, & time) Eyes: PERLA Respiratory: No evidence of acute respiratory distress  Cervical Spine Exam  Inspection: No masses, redness, or swelling Alignment: Symmetrical Functional ROM: Diminished ROM Stability: No instability detected Muscle strength & Tone: Functionally intact Sensory: Movement-associated discomfort Palpation: Non-contributory  Upper Extremity (UE) Exam    Side: Right upper extremity  Side: Left upper extremity  Inspection: No masses, redness, swelling, or asymmetry  Inspection: No masses, redness, swelling, or asymmetry  Functional ROM: Unrestricted ROM          Functional ROM: Unrestricted ROM          Muscle strength & Tone: Functionally intact  Muscle strength & Tone: Functionally intact  Sensory: Unimpaired  Sensory: Unimpaired  Palpation: Non-contributory  Palpation: Non-contributory   Thoracic Spine Exam  Inspection: No masses, redness, or swelling Alignment: Symmetrical Functional ROM: Unrestricted ROM Stability: No instability detected Sensory: Unimpaired Muscle strength & Tone: Functionally intact Palpation: Non-contributory  Lumbar Spine Exam  Inspection: No masses, redness, or swelling Alignment: Symmetrical Functional ROM: Unrestricted ROM Stability: No instability detected Muscle strength & Tone: Functionally intact Sensory: Unimpaired Palpation: Non-contributory Provocative Tests: Lumbar Hyperextension and rotation test: evaluation deferred today       Patrick's  Maneuver: evaluation deferred today  Gait & Posture Assessment  Ambulation: Unassisted Gait: Relatively normal for age and body habitus Posture: WNL   Lower Extremity Exam    Side: Right lower extremity  Side: Left lower extremity  Inspection: No masses, redness, swelling, or asymmetry  Inspection: No masses, redness, swelling, or asymmetry  Functional ROM: Unrestricted ROM          Functional ROM: Unrestricted ROM          Muscle strength & Tone: Functionally intact  Muscle strength & Tone: Functionally intact  Sensory: Unimpaired  Sensory: Unimpaired  Palpation: Non-contributory  Palpation: Non-contributory   Assessment  Primary Diagnosis & Pertinent Problem List: The primary encounter diagnosis was Chronic pain syndrome. 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 Beverely Pace at [facility] on 01/04/2013), and Thoracic facet syndrome (T8-10) were also pertinent to this visit.  Status Diagnosis  Stable Stable Stable 1. Chronic pain syndrome   2. Neuropathic pain   3. Diabetic polyneuropathy associated with diabetes mellitus due to underlying condition ([place])   4. Musculoskeletal pain   5. Chronic cervical radicular pain (left side)   6. Cervical facet syndrome (right side)   7. 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 Beverely Pace at [facility] on 01/04/2013)   8. Thoracic facet syndrome (T8-10)      Plan of Care  Pharmacotherapy (Medications Ordered): Meds ordered this encounter  Medications  . gabapentin (NEURONTIN) 800 MG tablet    Sig: Take 2 tablets (1,600 mg total) by mouth 2 (two) times daily.    Dispense:  360 tablet    Refill:  0    Do not place this medication, or any other prescription from our practice, on "Automatic  Refill". Patient may have prescription filled one day early if pharmacy is closed on scheduled refill date. (3 mo refill)  . cyclobenzaprine (FLEXERIL) 10 MG tablet    Sig: Take 1 tablet (10 mg total) by mouth every 8 (eight) hours as needed for muscle spasms.    Dispense:  270 tablet    Refill:  0    3 Month refill. Do not add this medication to the electronic "Automatic Refill" notification system. Patient may have prescription filled one day early if pharmacy is closed on scheduled refill date.  . Oxycodone HCl 10 MG TABS    Sig: Take 1 tablet (10 mg total) by mouth every 6 (six) hours as needed.    Dispense:  120 tablet    Refill:  0    Do not place this medication, or any other prescription from our practice, on "Automatic Refill". Patient may have prescription filled one day early if pharmacy is closed on scheduled refill date. Do not fill until: 03/16/16 To last until: 04/15/16  . Oxycodone HCl 10 MG TABS    Sig: Take 1 tablet (10 mg total) by mouth every 6 (six) hours as needed.    Dispense:  120 tablet    Refill:  0    Do not place this medication, or any other prescription from our practice, on "Automatic Refill". Patient may have prescription filled one day early if pharmacy is closed on scheduled refill date. Do not fill until: 04/15/16 To last until: 05/15/16  . Oxycodone HCl 10 MG TABS    Sig: Take 1 tablet (10 mg total) by mouth every 6 (six)  hours as needed.    Dispense:  120 tablet    Refill:  0    Do not place this medication, or any other prescription from our practice, on "Automatic Refill". Patient may have prescription filled one day early if pharmacy is closed on scheduled refill date. Do not fill until: 05/15/16 To last until: 06/14/16   New Prescriptions   No medications on file   Medications administered today: Mr. Hosea had no medications administered during this visit. Lab-work, procedure(s), and/or referral(s): No orders of the defined types were placed  in this encounter.  Imaging and/or referral(s): None  Interventional therapies: Planned, scheduled, and/or pending:   None at this time.   Considering:   None at this time.   Palliative PRN treatment(s):   Not at this time.   Provider-requested follow-up: Return in about 3 months (around 05/29/2016) for (MD) Med-Mgmt.  Future Appointments Date Time Provider [place]  05/28/2016 8:00 AM Milinda Pointer, MD [facility] None   Primary Care Physician: Madelyn Brunner, MD Location: [facility] Note by: Kathlen Brunswick Dossie Arbour, M.D, DABA, DABAPM, DABPM, DABIPP, FIPP Date: 02/29/16; Time: 5:40 PM  Pain Score Disclaimer: We use the NRS-11 scale. This is a self-reported, subjective measurement of pain severity with only modest accuracy. It is used primarily to identify changes within a particular patient. It must be understood that outpatient pain scales are significantly less accurate that those used for research, where they can be applied under ideal controlled circumstances with minimal exposure to variables. In reality, the score is likely to be a combination of pain intensity and pain affect, where pain affect describes the degree of emotional arousal or changes in action readiness caused by the sensory experience of pain. Factors such as social and work situation, setting, emotional state, anxiety levels, expectation, and prior pain experience may influence pain perception and show large inter-individual differences that may also be affected by time variables.  Patient instructions provided during this appointment: There are no Patient Instructions on file for this visit.

## 2016-02-29 NOTE — Progress Notes (Signed)
Nursing Pain Medication Assessment:  Safety precautions to be maintained throughout the outpatient stay will include: orient to surroundings, keep bed in low position, maintain call bell within reach at all times, provide assistance with transfer out of bed and ambulation.  Medication Inspection Compliance: Kenneth Osborn did not comply with our request to bring his pills to be counted. He was reminded that bringing the medication bottles, even when empty, is a requirement. Pill Count: No pills available to be counted today. Bottle Appearance: No container available. Did not bring bottle(s) to appointment. Medication: None brought in. Filled Date: N/A Medication last intake: 02-29-2016 at 0800

## 2016-03-26 ENCOUNTER — Other Ambulatory Visit: Payer: Self-pay | Admitting: Pain Medicine

## 2016-03-26 DIAGNOSIS — M7918 Myalgia, other site: Secondary | ICD-10-CM

## 2016-04-25 ENCOUNTER — Other Ambulatory Visit: Payer: Self-pay | Admitting: Pain Medicine

## 2016-04-25 DIAGNOSIS — M7918 Myalgia, other site: Secondary | ICD-10-CM

## 2016-05-10 ENCOUNTER — Other Ambulatory Visit: Payer: Self-pay | Admitting: Pain Medicine

## 2016-05-10 DIAGNOSIS — M7918 Myalgia, other site: Secondary | ICD-10-CM

## 2016-05-28 ENCOUNTER — Encounter: Payer: Self-pay | Admitting: Pain Medicine

## 2016-05-28 ENCOUNTER — Ambulatory Visit: Payer: Medicare Other | Attending: Pain Medicine | Admitting: Pain Medicine

## 2016-05-28 VITALS — BP 110/70 | HR 77 | Temp 98.1°F | Resp 16 | Ht 69.0 in | Wt 230.0 lb

## 2016-05-28 DIAGNOSIS — G894 Chronic pain syndrome: Secondary | ICD-10-CM

## 2016-05-28 DIAGNOSIS — Z79891 Long term (current) use of opiate analgesic: Secondary | ICD-10-CM | POA: Diagnosis not present

## 2016-05-28 DIAGNOSIS — Z7982 Long term (current) use of aspirin: Secondary | ICD-10-CM | POA: Insufficient documentation

## 2016-05-28 DIAGNOSIS — I2510 Atherosclerotic heart disease of native coronary artery without angina pectoris: Secondary | ICD-10-CM | POA: Diagnosis not present

## 2016-05-28 DIAGNOSIS — M961 Postlaminectomy syndrome, not elsewhere classified: Secondary | ICD-10-CM | POA: Insufficient documentation

## 2016-05-28 DIAGNOSIS — Z8249 Family history of ischemic heart disease and other diseases of the circulatory system: Secondary | ICD-10-CM | POA: Insufficient documentation

## 2016-05-28 DIAGNOSIS — R42 Dizziness and giddiness: Secondary | ICD-10-CM | POA: Insufficient documentation

## 2016-05-28 DIAGNOSIS — M545 Low back pain: Secondary | ICD-10-CM | POA: Insufficient documentation

## 2016-05-28 DIAGNOSIS — Z833 Family history of diabetes mellitus: Secondary | ICD-10-CM | POA: Diagnosis not present

## 2016-05-28 DIAGNOSIS — M7918 Myalgia, other site: Secondary | ICD-10-CM

## 2016-05-28 DIAGNOSIS — E785 Hyperlipidemia, unspecified: Secondary | ICD-10-CM | POA: Insufficient documentation

## 2016-05-28 DIAGNOSIS — G4730 Sleep apnea, unspecified: Secondary | ICD-10-CM | POA: Insufficient documentation

## 2016-05-28 DIAGNOSIS — M5010 Cervical disc disorder with radiculopathy, unspecified cervical region: Secondary | ICD-10-CM | POA: Diagnosis not present

## 2016-05-28 DIAGNOSIS — M791 Myalgia: Secondary | ICD-10-CM

## 2016-05-28 DIAGNOSIS — Z9689 Presence of other specified functional implants: Secondary | ICD-10-CM | POA: Diagnosis not present

## 2016-05-28 DIAGNOSIS — M488X2 Other specified spondylopathies, cervical region: Secondary | ICD-10-CM | POA: Insufficient documentation

## 2016-05-28 DIAGNOSIS — E1122 Type 2 diabetes mellitus with diabetic chronic kidney disease: Secondary | ICD-10-CM | POA: Insufficient documentation

## 2016-05-28 DIAGNOSIS — M488X6 Other specified spondylopathies, lumbar region: Secondary | ICD-10-CM | POA: Insufficient documentation

## 2016-05-28 DIAGNOSIS — Z794 Long term (current) use of insulin: Secondary | ICD-10-CM | POA: Diagnosis not present

## 2016-05-28 DIAGNOSIS — J449 Chronic obstructive pulmonary disease, unspecified: Secondary | ICD-10-CM | POA: Diagnosis not present

## 2016-05-28 DIAGNOSIS — M47816 Spondylosis without myelopathy or radiculopathy, lumbar region: Secondary | ICD-10-CM | POA: Insufficient documentation

## 2016-05-28 DIAGNOSIS — T85848S Pain due to other internal prosthetic devices, implants and grafts, sequela: Secondary | ICD-10-CM

## 2016-05-28 DIAGNOSIS — E0842 Diabetes mellitus due to underlying condition with diabetic polyneuropathy: Secondary | ICD-10-CM

## 2016-05-28 DIAGNOSIS — M4722 Other spondylosis with radiculopathy, cervical region: Secondary | ICD-10-CM | POA: Insufficient documentation

## 2016-05-28 DIAGNOSIS — E1142 Type 2 diabetes mellitus with diabetic polyneuropathy: Secondary | ICD-10-CM | POA: Diagnosis not present

## 2016-05-28 DIAGNOSIS — I129 Hypertensive chronic kidney disease with stage 1 through stage 4 chronic kidney disease, or unspecified chronic kidney disease: Secondary | ICD-10-CM | POA: Diagnosis not present

## 2016-05-28 DIAGNOSIS — K219 Gastro-esophageal reflux disease without esophagitis: Secondary | ICD-10-CM | POA: Diagnosis not present

## 2016-05-28 DIAGNOSIS — I471 Supraventricular tachycardia: Secondary | ICD-10-CM | POA: Insufficient documentation

## 2016-05-28 DIAGNOSIS — N183 Chronic kidney disease, stage 3 (moderate): Secondary | ICD-10-CM | POA: Diagnosis not present

## 2016-05-28 DIAGNOSIS — G3184 Mild cognitive impairment, so stated: Secondary | ICD-10-CM | POA: Diagnosis not present

## 2016-05-28 DIAGNOSIS — E1165 Type 2 diabetes mellitus with hyperglycemia: Secondary | ICD-10-CM | POA: Insufficient documentation

## 2016-05-28 DIAGNOSIS — Z79899 Other long term (current) drug therapy: Secondary | ICD-10-CM | POA: Insufficient documentation

## 2016-05-28 DIAGNOSIS — M792 Neuralgia and neuritis, unspecified: Secondary | ICD-10-CM

## 2016-05-28 DIAGNOSIS — Z9889 Other specified postprocedural states: Secondary | ICD-10-CM

## 2016-05-28 DIAGNOSIS — I252 Old myocardial infarction: Secondary | ICD-10-CM | POA: Insufficient documentation

## 2016-05-28 DIAGNOSIS — F1190 Opioid use, unspecified, uncomplicated: Secondary | ICD-10-CM

## 2016-05-28 MED ORDER — OXYCODONE HCL 10 MG PO TABS: 10.0000 mg | ORAL_TABLET | Freq: Four times a day (QID) | ORAL | 0 refills | Status: DC | PRN

## 2016-05-28 MED ORDER — GABAPENTIN 800 MG PO TABS: 1600.0000 mg | ORAL_TABLET | Freq: Two times a day (BID) | ORAL | 0 refills | Status: DC

## 2016-05-28 MED ORDER — CYCLOBENZAPRINE HCL 10 MG PO TABS: 10.0000 mg | ORAL_TABLET | Freq: Three times a day (TID) | ORAL | 0 refills | Status: DC | PRN

## 2016-05-28 NOTE — Progress Notes (Signed)
Nursing Pain Medication Assessment:  Safety precautions to be maintained throughout the outpatient stay will include: orient to surroundings, keep bed in low position, maintain call bell within reach at all times, provide assistance with transfer out of bed and ambulation.  Medication Inspection Compliance: Pill count conducted under aseptic conditions, in front of the patient. Neither the pills nor the bottle was removed from the patient's sight at any time. Once count was completed pills were immediately returned to the patient in their original bottle. Pill Count: 86 of 120 pills remain Bottle Appearance: Standard pharmacy container. Clearly labeled. Medication: Oxycodone ER (OxyContin) Filled Date: 03 / 30 / 2018

## 2016-05-28 NOTE — Progress Notes (Signed)
Patient's Name: Kenneth Osborn  MRN: [id number]  Referring Provider: Madelyn Brunner, MD  DOB: 1955/04/26  PCP: Madelyn Brunner, MD  DOS: 05/28/2016  Note by: Kathlen Brunswick. Dossie Arbour, MD  Service setting: Ambulatory outpatient  Specialty: Interventional Pain Management  Location: [facility] (AMB) Pain Management Facility    Patient type: Established   Primary Reason(s) for Visit: Encounter for prescription drug management (Level of risk: moderate) CC: Back Pain (lower)  HPI  Kenneth Osborn is a 61 y.o. year old, male patient, who comes today for a medication management evaluation. 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 Beverely Pace at [facility] on 01/04/2013); Long term current use of opiate analgesic; Long term prescription opiate use; Opiate use (60 MME/Day); Opiate dependence ([place]); Encounter for therapeutic drug level monitoring; Neurogenic pain; Neuropathic pain; Thoracic facet syndrome (T8-10); Lumbar facet syndrome (Bilateral); Cervical facet syndrome (right side); Cervical spondylosis; Lumbar spondylosis; Chronic pain of left upper extremity; Chronic cervical radicular pain (left side); Chronic upper back pain; History of thoracic spine surgery (S/P T9-10 IVD spacer); Failed back surgical syndrome; Musculoskeletal pain; Myofascial pain; Chronic left lower extremity pain; Chronic radicular lumbar pain (left L4 dermatomal pain); Coronary artery disease; History of MI (myocardial infarction) (May 2012); Chronic  anticoagulation (on Effient); History of Meniere's disease; Sleep apnea; Type 2 diabetes mellitus with hyperglycemia ([place]); Dysphagia; Encounter for interrogation of neurostimulator; [employer] spinal cord stimulator (implant date: 11/01/2014); Unstable angina ([employer]); Controlled type 2 diabetes mellitus without complication ([place]); Arthralgia of shoulder; CAD (coronary artery disease); Paroxysmal supraventricular tachycardia ([place]); Balance problem; Chronic tension-type headache, not intractable; Dizziness; Post concussion syndrome; Mild cognitive impairment; 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 His primarily concern today is the Back Pain (lower)  Pain Assessment: Self-Reported Pain Score: 5 /10 Clinically the patient looks like a 1/10 Reported level is inconsistent with clinical observations. Information on the proper use of the pain scale provided to the patient today Pain Location: Back Pain Orientation: Lower Pain Descriptors / Indicators: Spasm, Aching, Discomfort Pain Frequency: Constant  Kenneth Osborn was last scheduled for an appointment on 05/10/2016 for medication management. During today's appointment we reviewed Kenneth Osborn's chronic pain status, as well as his outpatient medication regimen. The patient indicates that he had a [employer] spinal cord stimulator implanted on 11/01/2014 by Dr. Leonia Corona, [facility]. The patient indicates that [employer] recently attempted to reprogram it but it only made things worse. He will like to have them return and see if they can make this a little better. He is also experiencing pain in the area of the battery implant. He was recently started on Elavil but he  had some problems with it and he lost balance and fell. This could've initiated the problem with the spinal cord stimulator as he indicates that it started hurting approximately 2 months ago. At this point he also  indicates that it has been shocking him. We'll make the necessary arrangements for the [employer] representative to be here during his next appointment at which time I will go ahead and do a trigger point injection over the generator implant with some local anesthetic and steroid. Because he cannot come off of his blood thinners, will have to do it with a small needle in today we have talked to him about the risks of such procedure. He is well aware of this and he has accepted.  The patient  reports that he does not use drugs. His body mass index is 33.97 kg/m.  Further details on both, my assessment(s), as well as the proposed treatment plan, please see below.  Controlled Substance Pharmacotherapy Assessment REMS (Risk Evaluation and Mitigation Strategy)  Analgesic:Oxycodone IR 10 mg every 6 hours (40 mg/day) MME/day:60 mg/day Ignatius Specking, RN  05/28/2016  8:26 AM  Sign at close encounter Nursing Pain Medication Assessment:  Safety precautions to be maintained throughout the outpatient stay will include: orient to surroundings, keep bed in low position, maintain call bell within reach at all times, provide assistance with transfer out of bed and ambulation.  Medication Inspection Compliance: Pill count conducted under aseptic conditions, in front of the patient. Neither the pills nor the bottle was removed from the patient's sight at any time. Once count was completed pills were immediately returned to the patient in their original bottle. Pill Count: 86 of 120 pills remain Bottle Appearance: Standard pharmacy container. Clearly labeled. Medication: Oxycodone ER (OxyContin) Filled Date: 03 / 30 / 2018   Pharmacokinetics: Liberation and absorption (onset of action):  WNL Distribution (time to peak effect): WNL Metabolism and excretion (duration of action): WNL         Pharmacodynamics: Desired effects: Analgesia: Mr. Maddix reports >50% benefit. Functional ability: Patient reports that medication allows him to accomplish basic ADLs Clinically meaningful improvement in function (CMIF): Sustained CMIF goals met Perceived effectiveness: Described as relatively effective, allowing for increase in activities of daily living (ADL) Undesirable effects: Side-effects or Adverse reactions: None reported Monitoring: [place] PMP: Online review of the past 100-monthperiod conducted. Compliant with practice rules and regulations List of all UDS test(s) done:  Lab Results  Component Value Date   TOXASSSELUR FINAL 11/27/2015   [place] 06/07/2015   TFort SalongaFINAL 03/22/2015   TGrantFINAL 12/21/2014   Last UDS on record: ToxAssure Select 13  Date Value Ref Range Status  11/27/2015 FINAL  Final    Comment:    ==================================================================== TOXASSURE SELECT 13 (MW) ==================================================================== Test                             Result       Flag       Units Drug Present and Declared for Prescription Verification   Desmethyldiazepam              271          EXPECTED   ng/mg creat   Oxazepam                       872          EXPECTED   ng/mg creat   Temazepam                      484          EXPECTED   ng/mg creat  Desmethyldiazepam, oxazepam, and temazepam are expected    metabolites of diazepam. Desmethyldiazepam and oxazepam are also    expected metabolites of other drugs, including chlordiazepoxide,    prazepam, clorazepate, and halazepam. Oxazepam is an expected    metabolite of temazepam. Oxazepam and temazepam are also    available as scheduled prescription medications.   Oxycodone                      1232         EXPECTED   ng/mg creat   Oxymorphone                     2523         EXPECTED   ng/mg creat   Noroxycodone                   1120         EXPECTED   ng/mg creat   Noroxymorphone                 961          EXPECTED   ng/mg creat    Sources of oxycodone are scheduled prescription medications.    Oxymorphone, noroxycodone, and noroxymorphone are expected    metabolites of oxycodone. Oxymorphone is also available as a    scheduled prescription medication. Drug Absent but Declared for Prescription Verification   Alprazolam                     Not Detected UNEXPECTED ng/mg creat ==================================================================== Test                      Result    Flag   Units      Ref Range   Creatinine              120              mg/dL      >=20 ==================================================================== Declared Medications:  The flagging and interpretation on this report are based on the  following declared medications.  Unexpected results may arise from  inaccuracies in the declared medications.  **Note: The testing scope of this panel includes these medications:  Alprazolam (Xanax)  Diazepam (Valium)  Oxycodone  **Note: The testing scope of this panel does not include following  reported medications:  Aspirin  Cetirizine (Zyrtec)  Cyclobenzaprine (Flexeril)  Diltiazem (Cardizem CD)  Enalapril (Vasotec)  Ezetimibe (Zetia)  Gabapentin  Hydrochlorothiazide (Maxzide)  Insulin (Humalog)  Magnesium Oxide  Meclizine (Antivert)  Metformin  Metoprolol (Toprol)  Montelukast (Singulair)  Naloxone (Narcan)  Nortriptyline (Pamelor)  Omega-3 Fatty Acids (Fish Oil)  Omeprazole (Nexium)  Prasugrel (Effient)  Ranitidine (Zantac)  Rosuvastatin (Crestor)  Triamterene (Maxzide) ==================================================================== For clinical consultation, please call [phone]. ====================================================================    UDS interpretation: Compliant           Medication Assessment Form: Reviewed. Patient indicates being compliant with therapy Treatment compliance: Compliant Risk Assessment Profile: Aberrant behavior: See prior evaluations. None observed or detected today Comorbid factors increasing risk of overdose: See prior notes. No additional risks detected today Risk of substance use disorder (SUD): Low Opioid Risk Tool (ORT) Total Score: 0  Interpretation Table:  Score <3 = Low Risk for SUD  Score between 4-7 = Moderate Risk for SUD  Score >8 = High Risk for Opioid Abuse   Risk Mitigation Strategies:  Patient Counseling: Covered Patient-Prescriber Agreement (PPA):  Present and active  Notification to other healthcare providers: Done  Pharmacologic Plan: No change in therapy, at this time  Laboratory Chemistry  Inflammation Markers Lab Results  Component Value Date   CRP 1.4 (H) 03/22/2015   ESRSEDRATE 9 03/22/2015   (CRP: Acute Phase) (ESR: Chronic Phase) Renal Function Markers Lab Results  Component Value Date   BUN 20 06/16/2015   CREATININE 1.24 06/16/2015   GFRAA >60 06/16/2015   GFRNONAA >60 06/16/2015   Hepatic Function Markers Lab Results  Component Value Date   AST 27 03/22/2015   ALT 34 03/22/2015   ALBUMIN 4.1 03/22/2015   ALKPHOS 84 03/22/2015   Electrolytes Lab Results  Component Value Date   NA 139 06/16/2015   K 4.7 06/16/2015   CL 107 06/16/2015   CALCIUM 9.5 06/16/2015   MG 1.7 03/22/2015   Neuropathy Markers No results found for: [id number] Bone Pathology Markers Lab Results  Component Value Date   ALKPHOS 84 03/22/2015   CALCIUM 9.5 06/16/2015   Coagulation Parameters Lab Results  Component Value Date   INR 1.0 02/23/2014   LABPROT 12.9 02/23/2014   APTT 25.9 08/18/2013   PLT 180 06/16/2015   Cardiovascular Markers Lab Results  Component Value Date   HGB 13.1 06/16/2015   HCT 38.5 (L) 06/16/2015   Note: Lab results reviewed.  Recent Diagnostic Imaging Review  Mr  Brain Wo Contrast Result Date: 11/13/2015 CLINICAL DATA:  2 concussive episodes last month. Headaches. Abnormal balance and vertigo. Slurred speech and confusion. EXAM: MRI HEAD WITHOUT CONTRAST TECHNIQUE: Multiplanar, multiecho pulse sequences of the brain and surrounding structures were obtained without intravenous contrast. COMPARISON:  CT head without contrast 10/04/2015 FINDINGS: Brain: Mild generalized atrophy is present. No acute infarct, hemorrhage, or mass lesion is present. No significant white matter changes are present. There is no evidence for microhemorrhage. Vascular: Flow is present in the major intracranial arteries. Skull and upper cervical spine: Skullbase is within normal limits. The internal auditory canals are normal bilaterally. Midline sagittal structures are unremarkable. Sinuses/Orbits: The paranasal sinuses are clear. The globes and orbits are intact. There is minimal fluid posteriorly in the mastoid air cells bilaterally. No obstructing nasopharyngeal lesion is evident. Other: Previously-seen is scalp soft tissue swelling has resolved. IMPRESSION: 1. Stable age advanced atrophy. 2. No acute intracranial abnormality. 3. No evidence for intracranial trauma. Electronically Signed   By: San Morelle M.D.   On: 11/13/2015 16:35   Note: Imaging results reviewed.          Meds  The patient has a current medication list which includes the following prescription(s): accu-chek aviva plus, alprazolam, aspirin ec, cetirizine, cyclobenzaprine, diazepam, diltiazem, enalapril, esomeprazole, ezetimibe, gabapentin, insulin glargine, insulin lispro, insulin lispro prot & lispro, insulin syringe-needle u-100, insulin syringe-needle u-100, magnesium oxide, meclizine, metformin, methylprednisolone, metoprolol succinate, naloxone, niacin, nortriptyline, fish oil, oxycodone hcl, oxycodone hcl, oxycodone hcl, prasugrel, ranitidine, rosuvastatin, triamterene-hydrochlorothiazide, and  montelukast.  Current Outpatient Prescriptions on File Prior to Visit  Medication Sig  . ACCU-CHEK AVIVA PLUS test strip   . ALPRAZolam (XANAX) 0.5 MG tablet Take 0.5 mg by mouth at bedtime as needed (Meniere's disease).   Marland Kitchen aspirin EC 81 MG tablet Take 81 mg by mouth at bedtime.   . cetirizine (ZYRTEC) 10 MG tablet Take 10 mg by mouth daily.  . diazepam (VALIUM) 5 MG tablet Take 5 mg by mouth 3 (three) times daily as needed (Meniere's disease).   Marland Kitchen diltiazem (CARTIA XT) 240 MG 24 hr capsule Take  240 mg by mouth at bedtime.  . enalapril (VASOTEC) 10 MG tablet Take 10 mg by mouth 2 (two) times daily.  Marland Kitchen esomeprazole (NEXIUM) 40 MG capsule Take 40 mg by mouth 2 (two) times daily.  Marland Kitchen ezetimibe (ZETIA) 10 MG tablet Take 10 mg by mouth 2 (two) times daily.   . insulin glargine (LANTUS) 100 UNIT/ML injection Inject 100 Units into the skin at bedtime.   . insulin lispro (HUMALOG KWIKPEN) 100 UNIT/ML KiwkPen Inject 30 Units into the skin daily before lunch.   . Insulin Lispro Prot & Lispro (HUMALOG MIX 75/25 KWIKPEN) (75-25) 100 UNIT/ML Kwikpen Inject 60 Units into the skin 2 (two) times daily with a meal.  . Insulin Syringe-Needle U-100 (BD INSULIN SYRINGE ULTRAFINE) 31G X 15/64" 1 ML MISC   . Insulin Syringe-Needle U-100 (BD INSULIN SYRINGE ULTRAFINE) 31G X 5/16" 0.5 ML MISC Use one daily  . magnesium oxide (MAG-OX) 400 MG tablet Take 400 mg by mouth daily.   . meclizine (ANTIVERT) 25 MG tablet Take 25 mg by mouth 3 (three) times daily as needed for dizziness.   . metFORMIN (GLUCOPHAGE) 1000 MG tablet Take 1,000 mg by mouth 2 (two) times daily with a meal.  . methylPREDNISolone (MEDROL DOSEPAK) 4 MG TBPK tablet   . metoprolol succinate (TOPROL-XL) 50 MG 24 hr tablet Take 50 mg by mouth 2 (two) times daily. Take with or immediately following a meal.   . naloxone (NARCAN) 2 MG/2ML injection Inject content of syringe into thigh muscle. Call 911.  . niacin (NIASPAN) 500 MG CR tablet Take 500 mg by  mouth at bedtime.  . nortriptyline (PAMELOR) 50 MG capsule Take 50 mg by mouth at bedtime.  . Omega-3 Fatty Acids (FISH OIL) 1000 MG CAPS Take 1,000 mg by mouth 2 (two) times daily.  . prasugrel (EFFIENT) 10 MG TABS Take 10 mg by mouth daily.  . ranitidine (ZANTAC) 150 MG tablet Take 150 mg by mouth at bedtime.   . rosuvastatin (CRESTOR) 20 MG tablet Take 20 mg by mouth at bedtime.  . triamterene-hydrochlorothiazide (MAXZIDE) 75-50 MG tablet Take 1 tablet by mouth daily.   . montelukast (SINGULAIR) 10 MG tablet Take 10 mg by mouth daily.    No current facility-administered medications on file prior to visit.    ROS  Constitutional: Denies any fever or chills Gastrointestinal: No reported hemesis, hematochezia, vomiting, or acute GI distress Musculoskeletal: Denies any acute onset joint swelling, redness, loss of ROM, or weakness Neurological: No reported episodes of acute onset apraxia, aphasia, dysarthria, agnosia, amnesia, paralysis, loss of coordination, or loss of consciousness  Allergies  Mr. Falotico has No Known Allergies.  PFSH  Drug: Mr. Treu  reports that he does not use drugs. Alcohol:  reports that he does not drink alcohol. Tobacco:  reports that he has never smoked. He has never used smokeless tobacco. Medical:  has a past medical history of Allergic rhinitis (12/30/2012); Bronchitis; Can't get food down (08/12/2014); Chronic back pain; Concussion (09/2015); COPD (chronic obstructive pulmonary disease) ([place]); Coronary artery disease; DDD (degenerative disc disease), cervical; Diabetes mellitus without complication ([place]); Dysphagia; GERD (gastroesophageal reflux disease); History of Meniere's disease (12/21/2014); History of thoracic spine surgery (S/P T9-10 IVD spacer) (12/21/2014); Hypercholesteremia; Hyperlipidemia; Hypertension; Meniere's disease; Myocardial infarction; Neuromuscular disorder ([place]); Psychosis; Short-segment Barrett's esophagus; and Sleep apnea. Family: family  history includes Cancer in his sister; Diabetes in his mother; Heart disease in his father, maternal aunt, maternal uncle, and mother.  Past Surgical History:  Procedure Laterality  Date  . APPENDECTOMY    . ARTHRODESIS ANTERIOR ANTERIOR CERVICLE SPINE  01/04/2013  . BACK SURGERY     fusion thoracic area  . CARDIAC CATHETERIZATION     may 2012 and Nov 20, 2010  . CARDIAC CATHETERIZATION N/A 06/15/2015   Procedure: Left Heart Cath and Coronary Angiography;  Surgeon: Corey Skains, MD;  Location: [place] [facility];  Service: Cardiovascular;  Laterality: N/A;  . CARDIAC CATHETERIZATION N/A 06/15/2015   Procedure: Coronary Stent Intervention;  Surgeon: Isaias Cowman, MD;  Location: [place] [facility];  Service: Cardiovascular;  Laterality: N/A;  . CARDIOVASCULAR STRESS TEST     jan 2014  . COLONOSCOPY WITH PROPOFOL N/A 09/19/2014   Procedure: COLONOSCOPY WITH PROPOFOL;  Surgeon: Manya Silvas, MD;  [facility] END[facility];  Service: Endoscopy;  Laterality: N/A;  . CORONARY ANGIOPLASTY     stent placement  . ESOPHAGOGASTRODUODENOSCOPY N/A 09/19/2014   Procedure: ESOPHAGOGASTRODUODENOSCOPY (EGD);  Surgeon: Manya Silvas, MD;  Location: [facility] ENDOSCOPY;  Service: Endoscopy;  Laterality: N/A;  . Ward right ear  . mastoid shunt     left, 2002, 1997 right ear, 1980 right ear  . SAVORY DILATION N/A 09/19/2014   Procedure: SAVORY DILATION;  Surgeon: Manya Silvas, MD;  Location: [facility] ENDOSCOPY;  Service: Endoscopy;  Laterality: N/A;  . SHOULDER ARTHROSCOPY WITH SUBACROMIAL DECOMPRESSION Left 04/06/2012   Procedure: SHOULDER ARTHROSCOPY WITH SUBACROMIAL DECOMPRESSION;  Surgeon: Vickey Huger, MD;  Location: [place];  Service: Orthopedics;  Laterality: Left;  left shoulder arthroscopy, subacromial decompression and distal clavicle resection  . SPINAL CORD STIMULATOR IMPLANT Right    Constitutional Exam  General appearance: Well nourished, well developed, and well  hydrated. In no apparent acute distress Vitals:   05/28/16 0810  BP: 110/70  Pulse: 77  Resp: 16  Temp: 98.1 F (36.7 C)  TempSrc: Oral  SpO2: 97%  Weight: 230 lb (104.3 kg)  Height: 5' 9"  (1.753 m)   BMI Assessment: Estimated body mass index is 33.97 kg/m as calculated from the following:   Height as of this encounter: 5' 9"  (1.753 m).   Weight as of this encounter: 230 lb (104.3 kg).  BMI interpretation table: BMI level Category Range association with higher incidence of chronic pain  <18 kg/m2 Underweight   18.5-24.9 kg/m2 Ideal body weight   25-29.9 kg/m2 Overweight Increased incidence by 20%  30-34.9 kg/m2 Obese (Class I) Increased incidence by 68%  35-39.9 kg/m2 Severe obesity (Class II) Increased incidence by 136%  >40 kg/m2 Extreme obesity (Class III) Increased incidence by 254%   BMI Readings from Last 4 Encounters:  05/28/16 33.97 kg/m  02/29/16 33.23 kg/m  11/27/15 33.23 kg/m  09/06/15 32.49 kg/m   Wt Readings from Last 4 Encounters:  05/28/16 230 lb (104.3 kg)  02/29/16 225 lb (102.1 kg)  11/27/15 225 lb (102.1 kg)  09/06/15 220 lb (99.8 kg)  Psych/Mental status: Alert, oriented x 3 (person, place, & time)       Eyes: PERLA Respiratory: No evidence of acute respiratory distress  Cervical Spine Exam  Inspection: No masses, redness, or swelling Alignment: Symmetrical Functional ROM: Unrestricted ROM Stability: No instability detected Muscle strength & Tone: Functionally intact Sensory: Unimpaired Palpation: No palpable anomalies  Upper Extremity (UE) Exam    Side: Right upper extremity  Side: Left upper extremity  Inspection: No masses, redness, swelling, or asymmetry. No contractures  Inspection: No masses, redness, swelling, or asymmetry. No contractures  Functional ROM: Unrestricted  ROM          Functional ROM: Unrestricted ROM          Muscle strength & Tone: Functionally intact  Muscle strength & Tone: Functionally intact  Sensory: Unimpaired   Sensory: Unimpaired  Palpation: No palpable anomalies  Palpation: No palpable anomalies  Specialized Test(s): Deferred         Specialized Test(s): Deferred          Thoracic Spine Exam  Inspection: No masses, redness, or swelling Alignment: Symmetrical Functional ROM: Unrestricted ROM Stability: No instability detected Sensory: Unimpaired Muscle strength & Tone: No palpable anomalies  Lumbar Spine Exam  Inspection: No masses, redness, or swelling Alignment: Symmetrical Functional ROM: Unrestricted ROM Stability: No instability detected Muscle strength & Tone: Functionally intact Sensory: Unimpaired Palpation: No palpable anomalies Provocative Tests: Lumbar Hyperextension and rotation test: evaluation deferred today       Patrick's Maneuver: evaluation deferred today              Gait & Posture Assessment  Ambulation: Unassisted Gait: Relatively normal for age and body habitus Posture: WNL   Lower Extremity Exam    Side: Right lower extremity  Side: Left lower extremity  Inspection: No masses, redness, swelling, or asymmetry. No contractures  Inspection: No masses, redness, swelling, or asymmetry. No contractures  Functional ROM: Unrestricted ROM          Functional ROM: Unrestricted ROM          Muscle strength & Tone: Functionally intact  Muscle strength & Tone: Functionally intact  Sensory: Unimpaired  Sensory: Unimpaired  Palpation: No palpable anomalies  Palpation: No palpable anomalies   Assessment  Primary Diagnosis & Pertinent Problem List: The primary encounter diagnosis was Chronic pain syndrome. Diagnoses of Neuropathic pain, Diabetic polyneuropathy associated with diabetes mellitus due to underlying condition [facility]), Musculoskeletal pain, Long term prescription opiate use, Opiate use (60 MME/Day), Pain from implanted hardware, sequela, and [employer] spinal cord stimulator (implant date: 11/01/2014) were also pertinent to this visit.  Status Diagnosis   Controlled Controlled Controlled 1. Chronic pain syndrome   2. Neuropathic pain   3. Diabetic polyneuropathy associated with diabetes mellitus due to underlying condition ([place])   4. Musculoskeletal pain   5. Long term prescription opiate use   6. Opiate use (60 MME/Day)   7. Pain from implanted hardware, sequela   8. [employer] spinal cord stimulator (implant date: 11/01/2014)      Plan of Care  Pharmacotherapy (Medications Ordered): Meds ordered this encounter  Medications  . Oxycodone HCl 10 MG TABS    Sig: Take 1 tablet (10 mg total) by mouth every 6 (six) hours as needed.    Dispense:  120 tablet    Refill:  0    Do not place this medication, or any other prescription from our practice, on "Automatic Refill". Patient may have prescription filled one day early if pharmacy is closed on scheduled refill date. Do not fill until: 07/18/16 To last until: 08/17/16  . Oxycodone HCl 10 MG TABS    Sig: Take 1 tablet (10 mg total) by mouth every 6 (six) hours as needed.    Dispense:  120 tablet    Refill:  0    Do not place this medication, or any other prescription from our practice, on "Automatic Refill". Patient may have prescription filled one day early if pharmacy is closed on scheduled refill date. Do not fill until: 06/18/16 To last until: 07/18/16  . Oxycodone HCl 10  MG TABS    Sig: Take 1 tablet (10 mg total) by mouth every 6 (six) hours as needed.    Dispense:  120 tablet    Refill:  0    Do not place this medication, or any other prescription from our practice, on "Automatic Refill". Patient may have prescription filled one day early if pharmacy is closed on scheduled refill date. Do not fill until: 08/17/16 To last until: 09/16/16  . gabapentin (NEURONTIN) 800 MG tablet    Sig: Take 2 tablets (1,600 mg total) by mouth 2 (two) times daily.    Dispense:  360 tablet    Refill:  0    Do not place this medication, or any other prescription from our practice, on "Automatic  Refill". Patient may have prescription filled one day early if pharmacy is closed on scheduled refill date. (3 mo refill)  . cyclobenzaprine (FLEXERIL) 10 MG tablet    Sig: Take 1 tablet (10 mg total) by mouth every 8 (eight) hours as needed for muscle spasms.    Dispense:  270 tablet    Refill:  0    3 Month refill. Do not add this medication to the electronic "Automatic Refill" notification system. Patient may have prescription filled one day early if pharmacy is closed on scheduled refill date.   New Prescriptions   No medications on file   Medications administered today: [name] had no medications administered during this visit. Lab-work, procedure(s), and/or referral(s): Orders Placed This Encounter  Procedures  . TRIGGER POINT INJECTION  . Comprehensive metabolic panel  . C-reactive protein  . Magnesium  . Sedimentation rate  . Vitamin B12  . 25-Hydroxyvitamin D Lcms D2+D3  . ToxASSURE Select 13 (MW), Urine   Imaging and/or referral(s): None  Interventional therapies: Planned, scheduled, and/or pending:   Implant site injection with local and steroid with 25-G needle. No need to stop blood thinner. Combine with [employer] rep programming.   Considering:   None at this time. Patient is anticoagulated with severe cardiovascular disease    Palliative PRN treatment(s):   None at this time.   Provider-requested follow-up: Return in 3 months (on 09/02/2016) for (Nurse Practitioner) Med-Mgmt.  Future Appointments Date Time Provider [place]  06/03/2016 9:15 AM Milinda Pointer, MD [facility] None  08/26/2016 9:00 AM [place], NP [facility] None   Primary Care Physician: Madelyn Brunner, MD Location: [facility] Note by: Kathlen Brunswick Dossie Arbour, M.D, DABA, DABAPM, DABPM, DABIPP, FIPP Date: 05/28/2016; Time: 6:28 PM  Pain Score Disclaimer: We use the NRS-11 scale. This is a self-reported, subjective measurement of pain severity with  only modest accuracy. It is used primarily to identify changes within a particular patient. It must be understood that outpatient pain scales are significantly less accurate that those used for research, where they can be applied under ideal controlled circumstances with minimal exposure to variables. In reality, the score is likely to be a combination of pain intensity and pain affect, where pain affect describes the degree of emotional arousal or changes in action readiness caused by the sensory experience of pain. Factors such as social and work situation, setting, emotional state, anxiety levels, expectation, and prior pain experience may influence pain perception and show large inter-individual differences that may also be affected by time variables.  Patient instructions provided during this appointment: Patient Instructions   Pain Score  Introduction: The pain score used by this practice is the Verbal Numerical Rating Scale (VNRS-11). This  is an 11-point scale. It is for adults and children 10 years or older. There are significant differences in how the pain score is reported, used, and applied. Forget everything you learned in the past and learn this scoring system.  General Information: The scale should reflect your current level of pain. Unless you are specifically asked for the level of your worst pain, or your average pain. If you are asked for one of these two, then it should be understood that it is over the past 24 hours.  Basic Activities of Daily Living (ADL): Personal hygiene, dressing, eating, transferring, and using restroom.  Instructions: Most patients tend to report their level of pain as a combination of two factors, their physical pain and their psychosocial pain. This last one is also known as "suffering" and it is reflection of how physical pain affects you socially and psychologically. From now on, report them separately. From this point on, when asked to report your pain  level, report only your physical pain. Use the following table for reference.  Pain Clinic Pain Levels (0-5/10)  Pain Level Score Description  No Pain 0   Mild pain 1 Nagging, annoying, but does not interfere with basic activities of daily living (ADL). Patients are able to eat, bathe, get dressed, toileting (being able to get on and off the toilet and perform personal hygiene functions), transfer (move in and out of bed or a chair without assistance), and maintain continence (able to control bladder and bowel functions). Blood pressure and heart rate are unaffected. A normal heart rate for a healthy adult ranges from 60 to 100 bpm (beats per minute).   Mild to moderate pain 2 Noticeable and distracting. Impossible to hide from other people. More frequent flare-ups. Still possible to adapt and function close to normal. It can be very annoying and may have occasional stronger flare-ups. With discipline, patients may get used to it and adapt.   Moderate pain 3 Interferes significantly with activities of daily living (ADL). It becomes difficult to feed, bathe, get dressed, get on and off the toilet or to perform personal hygiene functions. Difficult to get in and out of bed or a chair without assistance. Very distracting. With effort, it can be ignored when deeply involved in activities.   Moderately severe pain 4 Impossible to ignore for more than a few minutes. With effort, patients may still be able to manage work or participate in some social activities. Very difficult to concentrate. Signs of autonomic nervous system discharge are evident: dilated pupils (mydriasis); mild sweating (diaphoresis); sleep interference. Heart rate becomes elevated (>115 bpm). Diastolic blood pressure (lower number) rises above 100 mmHg. Patients find relief in laying down and not moving.   Severe pain 5 Intense and extremely unpleasant. Associated with frowning face and frequent crying. Pain overwhelms the senses.   Ability to do any activity or maintain social relationships becomes significantly limited. Conversation becomes difficult. Pacing back and forth is common, as getting into a comfortable position is nearly impossible. Pain wakes you up from deep sleep. Physical signs will be obvious: pupillary dilation; increased sweating; goosebumps; brisk reflexes; cold, clammy hands and feet; nausea, vomiting or dry heaves; loss of appetite; significant sleep disturbance with inability to fall asleep or to remain asleep. When persistent, significant weight loss is observed due to the complete loss of appetite and sleep deprivation.  Blood pressure and heart rate becomes significantly elevated. Caution: If elevated blood pressure triggers a pounding headache associated with blurred vision, then  the patient should immediately seek attention at an urgent or emergency care unit, as these may be signs of an impending stroke.    Emergency Department Pain Levels (6-10/10)  Emergency Room Pain 6 Severely limiting. Requires emergency care and should not be seen or managed at an outpatient pain management facility. Communication becomes difficult and requires great effort. Assistance to reach the emergency department may be required. Facial flushing and profuse sweating along with potentially dangerous increases in heart rate and blood pressure will be evident.   Distressing pain 7 Self-care is very difficult. Assistance is required to transport, or use restroom. Assistance to reach the emergency department will be required. Tasks requiring coordination, such as bathing and getting dressed become very difficult.   Disabling pain 8 Self-care is no longer possible. At this level, pain is disabling. The individual is unable to do even the most "basic" activities such as walking, eating, bathing, dressing, transferring to a bed, or toileting. Fine motor skills are lost. It is difficult to think clearly.   Incapacitating pain 9 Pain  becomes incapacitating. Thought processing is no longer possible. Difficult to remember your own name. Control of movement and coordination are lost.   The worst pain imaginable 10 At this level, most patients pass out from pain. When this level is reached, collapse of the autonomic nervous system occurs, leading to a sudden drop in blood pressure and heart rate. This in turn results in a temporary and dramatic drop in blood flow to the brain, leading to a loss of consciousness. Fainting is one of the body's self defense mechanisms. Passing out puts the brain in a calmed state and causes it to shut down for a while, in order to begin the healing process.    Summary: 1. Refer to this scale when providing [name] with your pain level. 2. Be accurate and careful when reporting your pain level. This will help with your care. 3. Over-reporting your pain level will lead to loss of credibility. 4. Even a level of 1/10 means that there is pain and will be treated at our facility. 5. High, inaccurate reporting will be documented as "Symptom Exaggeration", leading to loss of credibility and suspicions of possible secondary gains such as obtaining more narcotics, or wanting to appear disabled, for fraudulent reasons. 6. Only pain levels of 5 or below will be seen at our facility. 7. Pain levels of 6 and above will be sent to the Emergency Department and the appointment cancelled. _____________________________________________________________________________________________   Trigger Point Injection Trigger points are areas where you have pain. A trigger point injection is a shot given in the trigger point to help relieve pain for a few days to a few months. Common places for trigger points include:  The neck.  The shoulders.  The upper back.  The lower back. A trigger point injection will not cure long-lasting (chronic) pain permanently. These injections do not always work for every person, but for some  people they can help to relieve pain for a few days to a few months. Tell a health care provider about:  Any allergies you have.  All medicines you are taking, including vitamins, herbs, eye drops, creams, and over-the-counter medicines.  Any problems you or family members have had with anesthetic medicines.  Any blood disorders you have.  Any surgeries you have had.  Any medical conditions you have. What are the risks? Generally, this is a safe procedure. However, problems may occur, including:  Infection.  Bleeding.  Allergic  reaction to the injected medicine.  Irritation of the skin around the injection site. What happens before the procedure?  Ask your health care provider about changing or stopping your regular medicines. This is especially important if you are taking diabetes medicines or blood thinners. What happens during the procedure?  Your health care provider will feel for trigger points. A marker may be used to circle the area for the injection.  The skin over the trigger point will be washed with a germ-killing (antiseptic) solution.  A thin needle is used for the shot. You may feel pain or a twitching feeling when the needle enters the trigger point.  A numbing solution may be injected into the trigger point. Sometimes a medicine to keep down swelling, redness, and warmth (inflammation) is also injected.  Your health care provider may move the needle around the area where the trigger point is located until the tightness and twitching goes away.  After the injection, your health care provider may put gentle pressure over the injection site.  The injection site will be covered with a bandage (dressing). The procedure may vary among health care providers and hospitals. What happens after the procedure?  The dressing can be taken off in a few hours or as told by your health care provider.  You may feel sore and stiff for 1-2 days. This information is not  intended to replace advice given to you by your health care provider. Make sure you discuss any questions you have with your health care provider. Document Released: 01/24/2011 Document Revised: 10/08/2015 Document Reviewed: 07/25/2014 Elsevier Interactive Patient Education  2017 [employer].

## 2016-05-28 NOTE — Patient Instructions (Addendum)
Pain Score  Introduction: The pain score used by this practice is the Verbal Numerical Rating Scale (VNRS-11). This is an 11-point scale. It is for adults and children 10 years or older. There are significant differences in how the pain score is reported, used, and applied. Forget everything you learned in the past and learn this scoring system.  General Information: The scale should reflect your current level of pain. Unless you are specifically asked for the level of your worst pain, or your average pain. If you are asked for one of these two, then it should be understood that it is over the past 24 hours.  Basic Activities of Daily Living (ADL): Personal hygiene, dressing, eating, transferring, and using restroom.  Instructions: Most patients tend to report their level of pain as a combination of two factors, their physical pain and their psychosocial pain. This last one is also known as "suffering" and it is reflection of how physical pain affects you socially and psychologically. From now on, report them separately. From this point on, when asked to report your pain level, report only your physical pain. Use the following table for reference.  Pain Clinic Pain Levels (0-5/10)  Pain Level Score Description  No Pain 0   Mild pain 1 Nagging, annoying, but does not interfere with basic activities of daily living (ADL). Patients are able to eat, bathe, get dressed, toileting (being able to get on and off the toilet and perform personal hygiene functions), transfer (move in and out of bed or a chair without assistance), and maintain continence (able to control bladder and bowel functions). Blood pressure and heart rate are unaffected. A normal heart rate for a healthy adult ranges from 60 to 100 bpm (beats per minute).   Mild to moderate pain 2 Noticeable and distracting. Impossible to hide from other people. More frequent flare-ups. Still possible to adapt and function close to normal. It can be very  annoying and may have occasional stronger flare-ups. With discipline, patients may get used to it and adapt.   Moderate pain 3 Interferes significantly with activities of daily living (ADL). It becomes difficult to feed, bathe, get dressed, get on and off the toilet or to perform personal hygiene functions. Difficult to get in and out of bed or a chair without assistance. Very distracting. With effort, it can be ignored when deeply involved in activities.   Moderately severe pain 4 Impossible to ignore for more than a few minutes. With effort, patients may still be able to manage work or participate in some social activities. Very difficult to concentrate. Signs of autonomic nervous system discharge are evident: dilated pupils (mydriasis); mild sweating (diaphoresis); sleep interference. Heart rate becomes elevated (>115 bpm). Diastolic blood pressure (lower number) rises above 100 mmHg. Patients find relief in laying down and not moving.   Severe pain 5 Intense and extremely unpleasant. Associated with frowning face and frequent crying. Pain overwhelms the senses.  Ability to do any activity or maintain social relationships becomes significantly limited. Conversation becomes difficult. Pacing back and forth is common, as getting into a comfortable position is nearly impossible. Pain wakes you up from deep sleep. Physical signs will be obvious: pupillary dilation; increased sweating; goosebumps; brisk reflexes; cold, clammy hands and feet; nausea, vomiting or dry heaves; loss of appetite; significant sleep disturbance with inability to fall asleep or to remain asleep. When persistent, significant weight loss is observed due to the complete loss of appetite and sleep deprivation.  Blood pressure and heart  rate becomes significantly elevated. Caution: If elevated blood pressure triggers a pounding headache associated with blurred vision, then the patient should immediately seek attention at an urgent or  emergency care unit, as these may be signs of an impending stroke.    Emergency Department Pain Levels (6-10/10)  Emergency Room Pain 6 Severely limiting. Requires emergency care and should not be seen or managed at an outpatient pain management facility. Communication becomes difficult and requires great effort. Assistance to reach the emergency department may be required. Facial flushing and profuse sweating along with potentially dangerous increases in heart rate and blood pressure will be evident.   Distressing pain 7 Self-care is very difficult. Assistance is required to transport, or use restroom. Assistance to reach the emergency department will be required. Tasks requiring coordination, such as bathing and getting dressed become very difficult.   Disabling pain 8 Self-care is no longer possible. At this level, pain is disabling. The individual is unable to do even the most "basic" activities such as walking, eating, bathing, dressing, transferring to a bed, or toileting. Fine motor skills are lost. It is difficult to think clearly.   Incapacitating pain 9 Pain becomes incapacitating. Thought processing is no longer possible. Difficult to remember your own name. Control of movement and coordination are lost.   The worst pain imaginable 10 At this level, most patients pass out from pain. When this level is reached, collapse of the autonomic nervous system occurs, leading to a sudden drop in blood pressure and heart rate. This in turn results in a temporary and dramatic drop in blood flow to the brain, leading to a loss of consciousness. Fainting is one of the body's self defense mechanisms. Passing out puts the brain in a calmed state and causes it to shut down for a while, in order to begin the healing process.    Summary: 1. Refer to this scale when providing [name] with your pain level. 2. Be accurate and careful when reporting your pain level. This will help with your care. 3. Over-reporting  your pain level will lead to loss of credibility. 4. Even a level of 1/10 means that there is pain and will be treated at our facility. 5. High, inaccurate reporting will be documented as "Symptom Exaggeration", leading to loss of credibility and suspicions of possible secondary gains such as obtaining more narcotics, or wanting to appear disabled, for fraudulent reasons. 6. Only pain levels of 5 or below will be seen at our facility. 7. Pain levels of 6 and above will be sent to the Emergency Department and the appointment cancelled. _____________________________________________________________________________________________   Trigger Point Injection Trigger points are areas where you have pain. A trigger point injection is a shot given in the trigger point to help relieve pain for a few days to a few months. Common places for trigger points include:  The neck.  The shoulders.  The upper back.  The lower back. A trigger point injection will not cure long-lasting (chronic) pain permanently. These injections do not always work for every person, but for some people they can help to relieve pain for a few days to a few months. Tell a health care provider about:  Any allergies you have.  All medicines you are taking, including vitamins, herbs, eye drops, creams, and over-the-counter medicines.  Any problems you or family members have had with anesthetic medicines.  Any blood disorders you have.  Any surgeries you have had.  Any medical conditions you have. What are the  risks? Generally, this is a safe procedure. However, problems may occur, including:  Infection.  Bleeding.  Allergic reaction to the injected medicine.  Irritation of the skin around the injection site. What happens before the procedure?  Ask your health care provider about changing or stopping your regular medicines. This is especially important if you are taking diabetes medicines or blood thinners. What  happens during the procedure?  Your health care provider will feel for trigger points. A marker may be used to circle the area for the injection.  The skin over the trigger point will be washed with a germ-killing (antiseptic) solution.  A thin needle is used for the shot. You may feel pain or a twitching feeling when the needle enters the trigger point.  A numbing solution may be injected into the trigger point. Sometimes a medicine to keep down swelling, redness, and warmth (inflammation) is also injected.  Your health care provider may move the needle around the area where the trigger point is located until the tightness and twitching goes away.  After the injection, your health care provider may put gentle pressure over the injection site.  The injection site will be covered with a bandage (dressing). The procedure may vary among health care providers and hospitals. What happens after the procedure?  The dressing can be taken off in a few hours or as told by your health care provider.  You may feel sore and stiff for 1-2 days. This information is not intended to replace advice given to you by your health care provider. Make sure you discuss any questions you have with your health care provider. Document Released: 01/24/2011 Document Revised: 10/08/2015 Document Reviewed: 07/25/2014 Elsevier Interactive Patient Education  2017 [employer].

## 2016-05-29 ENCOUNTER — Telehealth: Payer: Self-pay

## 2016-05-29 NOTE — Telephone Encounter (Signed)
Called [employer] as Dr Lowella Dandy requested to have someone come in and speak with pt about stimulator

## 2016-06-03 ENCOUNTER — Encounter: Payer: Self-pay | Admitting: Pain Medicine

## 2016-06-03 ENCOUNTER — Ambulatory Visit
Admission: RE | Admit: 2016-06-03 | Discharge: 2016-06-03 | Disposition: A | Discharge status: Home / Self Care | Payer: Medicare Other | Source: Ambulatory Visit | Attending: Pain Medicine | Admitting: Pain Medicine

## 2016-06-03 ENCOUNTER — Ambulatory Visit (HOSPITAL_BASED_OUTPATIENT_CLINIC_OR_DEPARTMENT_OTHER): Payer: Medicare Other | Admitting: Pain Medicine

## 2016-06-03 VITALS — BP 115/88 | HR 86 | Resp 14 | Ht 69.0 in | Wt 225.0 lb

## 2016-06-03 DIAGNOSIS — M7918 Myalgia, other site: Secondary | ICD-10-CM

## 2016-06-03 DIAGNOSIS — M545 Low back pain: Secondary | ICD-10-CM | POA: Insufficient documentation

## 2016-06-03 DIAGNOSIS — G8929 Other chronic pain: Secondary | ICD-10-CM

## 2016-06-03 DIAGNOSIS — Z9861 Coronary angioplasty status: Secondary | ICD-10-CM | POA: Insufficient documentation

## 2016-06-03 DIAGNOSIS — Y838 Other surgical procedures as the cause of abnormal reaction of the patient, or of later complication, without mention of misadventure at the time of the procedure: Secondary | ICD-10-CM | POA: Insufficient documentation

## 2016-06-03 DIAGNOSIS — M791 Myalgia: Secondary | ICD-10-CM

## 2016-06-03 DIAGNOSIS — T85848S Pain due to other internal prosthetic devices, implants and grafts, sequela: Secondary | ICD-10-CM | POA: Diagnosis not present

## 2016-06-03 LAB — TOXASSURE SELECT 13 (MW), URINE

## 2016-06-03 MED ORDER — ROPIVACAINE HCL 2 MG/ML IJ SOLN
20.0000 mg | Freq: Once | INTRAMUSCULAR | Status: AC
  Administered 2016-06-03: 20 mg
  Filled 2016-06-03: qty 10

## 2016-06-03 MED ORDER — TRIAMCINOLONE ACETONIDE 40 MG/ML IJ SUSP
40.0000 mg | Freq: Once | INTRAMUSCULAR | Status: AC
  Administered 2016-06-03: 40 mg via INTRAMUSCULAR
  Filled 2016-06-03: qty 1

## 2016-06-03 MED ORDER — LIDOCAINE HCL (PF) 1 % IJ SOLN
10.0000 mL | Freq: Once | INTRAMUSCULAR | Status: AC
  Administered 2016-06-03: 5 mL
  Filled 2016-06-03: qty 10

## 2016-06-03 NOTE — Progress Notes (Signed)
Patient's Name: Kenneth Osborn  MRN: [id number]  Referring Provider: Milinda Pointer, MD  DOB: 1955-11-17  PCP: Madelyn Brunner, MD  DOS: 06/03/2016  Note by: Kathlen Brunswick. Dossie Arbour, MD  Service setting: Ambulatory outpatient  Location: [facility] (AMB) Pain Management Facility  Visit type: Procedure  Specialty: Interventional Pain Management  Patient type: Established   Primary Reason for Visit: Interventional Pain Management Treatment. CC: Back Pain (low)  Procedure:  Anesthesia, Analgesia, Anxiolysis:  Type: Trigger Point Injection Level: PSIS Laterality: Right side Position: Prone Target Muscle: Gluteus maximus Approach: Posterior percutaneous Region: Lower  Lumbar area. Primary Purpose: Diagnostic  Type: Local Anesthesia Local Anesthetic: Lidocaine 1% Route: Infiltration ([place]/IM) IV Access: Declined Sedation: Declined  Indication(s): Analgesia          Indications: 1. Pain from implanted hardware, sequela   2. Chronic low back pain without sciatica, unspecified back pain laterality   3. Myofascial pain    Pain Score: Pre-procedure: 4 /10 Post-procedure: 0-No pain/10  Pre-op Assessment:  Previous date of service: 05/28/16 Service provided: Med Refill Kenneth Osborn is a 61 y.o. (year old), male patient, seen today for interventional treatment. He  has a past surgical history that includes Cardiovascular stress test; Cardiac catheterization; Coronary angioplasty; Labrinthectomy; mastoid shunt; Appendectomy; Back surgery; Shoulder arthroscopy with subacromial decompression (Left, 04/06/2012); ARTHRODESIS ANTERIOR ANTERIOR CERVICLE SPINE (01/04/2013); Colonoscopy with propofol (N/A, 09/19/2014); Esophagogastroduodenoscopy (N/A, 09/19/2014); Savory dilation (N/A, 09/19/2014); Spinal cord stimulator implant (Right); Cardiac catheterization (N/A, 06/15/2015); and Cardiac catheterization (N/A, 06/15/2015). His primarily concern today is the Back Pain (low)  Initial Vital Signs: Blood pressure 115/88,  pulse 86, resp. rate 14, height 5' 9"  (1.753 m), weight 225 lb (102.1 kg), SpO2 97 %. BMI: 33.23 kg/m  Risk Assessment: Allergies: Reviewed. He has No Known Allergies.  Allergy Precautions: None required Coagulopathies: "Reviewed. None identified.  Blood-thinner therapy: None at this time Active Infection(s): Reviewed. None identified. Kenneth Osborn is afebrile  Site Confirmation: Kenneth Osborn was asked to confirm the procedure and laterality before marking the site Procedure checklist: Completed Consent: Before the procedure and under the influence of no sedative(s), amnesic(s), or anxiolytics, the patient was informed of the treatment options, risks and possible complications. To fulfill our ethical and legal obligations, as recommended by the American Medical Association's Code of Ethics, I have informed the patient of my clinical impression; the nature and purpose of the treatment or procedure; the risks, benefits, and possible complications of the intervention; the alternatives, including doing nothing; the risk(s) and benefit(s) of the alternative treatment(s) or procedure(s); and the risk(s) and benefit(s) of doing nothing. The patient was provided information about the general risks and possible complications associated with the procedure. These may include, but are not limited to: failure to achieve desired goals, infection, bleeding, organ or nerve damage, allergic reactions, paralysis, and death. In addition, the patient was informed of those risks and complications associated to the procedure, such as failure to decrease pain; infection; bleeding; organ or nerve damage with subsequent damage to sensory, motor, and/or autonomic systems, resulting in permanent pain, numbness, and/or weakness of one or several areas of the body; allergic reactions; (i.e.: anaphylactic reaction); and/or death. Furthermore, the patient was informed of those risks and complications associated with the medications.  These include, but are not limited to: allergic reactions (i.e.: anaphylactic or anaphylactoid reaction(s)); adrenal axis suppression; blood sugar elevation that in diabetics may result in ketoacidosis or comma; water retention that in patients with history of congestive heart failure may result  in shortness of breath, pulmonary edema, and decompensation with resultant heart failure; weight gain; swelling or edema; medication-induced neural toxicity; particulate matter embolism and blood vessel occlusion with resultant organ, and/or nervous system infarction; and/or aseptic necrosis of one or more joints. Finally, the patient was informed that Medicine is not an exact science; therefore, there is also the possibility of unforeseen or unpredictable risks and/or possible complications that may result in a catastrophic outcome. The patient indicated having understood very clearly. We have given the patient no guarantees and we have made no promises. Enough time was given to the patient to ask questions, all of which were answered to the patient's satisfaction. Kenneth Osborn has indicated that he wanted to continue with the procedure. Attestation: I, the ordering provider, attest that I have discussed with the patient the benefits, risks, side-effects, alternatives, likelihood of achieving goals, and potential problems during recovery for the procedure that I have provided informed consent. Date: 06/03/2016; Time: 5:06 PM  Pre-Procedure Preparation:  Monitoring: As per clinic protocol. Respiration, ETCO2, SpO2, BP, heart rate and rhythm monitor placed and checked for adequate function Safety Precautions: Patient was assessed for positional comfort and pressure points before starting the procedure. Time-out: I initiated and conducted the "Time-out" before starting the procedure, as per protocol. The patient was asked to participate by confirming the accuracy of the "Time Out" information. Verification of the correct  person, site, and procedure were performed and confirmed by me, the nursing staff, and the patient. "Time-out" conducted as per Joint Commission's Universal Protocol (UP.01.01.01). "Time-out" Date & Time: 06/03/2016; 1104 hrs.  Description of Procedure Process:   Position: Jacknife Target Area: Trigger Point Approach: Ipsilateral approach. Area Prepped: Entire     Region Prepping solution: ChloraPrep (2% chlorhexidine gluconate and 70% isopropyl alcohol) Safety Precautions: Aspiration looking for blood return was conducted prior to all injections. At no point did we inject any substances, as a needle was being advanced. No attempts were made at seeking any paresthesias. Safe injection practices and needle disposal techniques used. Medications properly checked for expiration dates. SDV (single dose vial) medications used. Description of the Procedure: Protocol guidelines were followed. The patient was placed in position over the fluoroscopy table. The target area was identified and the area prepped in the usual manner. Skin desensitized using vapocoolant spray. Skin & deeper tissues infiltrated with local anesthetic. Appropriate amount of time allowed to pass for local anesthetics to take effect. The procedure needles were then advanced to the target area. Proper needle placement secured. Negative aspiration confirmed. Solution injected in intermittent fashion, asking for systemic symptoms every 0.5cc of injectate. The needles were then removed and the area cleansed, making sure to leave some of the prepping solution back to take advantage of its long term bactericidal properties. Vitals:   06/03/16 0853 06/03/16 1105 06/03/16 1109  BP:  122/77 115/88  Pulse:  86 86  Resp:  12 14  SpO2:  94% 97%  Weight: 225 lb (102.1 kg)    Height: 5' 9"  (1.753 m)      Start Time: 1104 hrs. End Time: 1107 hrs. Materials:  Needle(s) Type: Regular needle Gauge: 25G Length: 3.5-in Medication(s): We administered  lidocaine (PF), ropivacaine (PF) 2 mg/mL (0.2%), and triamcinolone acetonide. Please see chart orders for dosing details.  Imaging Guidance:  Type of Imaging Technique: None used Indication(s): N/A Exposure Time: No patient exposure Contrast: None used. Fluoroscopic Guidance: N/A Ultrasound Guidance: N/A Interpretation: N/A  Antibiotic Prophylaxis:  Indication(s): None identified Antibiotic given:  None  Post-operative Assessment:  EBL: None Complications: No immediate post-treatment complications observed by team, or reported by patient. Note: The patient tolerated the entire procedure well. A repeat set of vitals were taken after the procedure and the patient was kept under observation following institutional policy, for this type of procedure. Post-procedural neurological assessment was performed, showing return to baseline, prior to discharge. The patient was provided with post-procedure discharge instructions, including a section on how to identify potential problems. Should any problems arise concerning this procedure, the patient was given instructions to immediately contact us, at any time, without hesitation. In any case, we plan to contact the patient by telephone for a follow-up status report regarding this interventional procedure. Comments:  No additional relevant information.  Plan of Care  Disposition: Discharge home  Discharge Date & Time: 06/03/2016; 1115 hrs.  Physician-requested Follow-up:  Return in about 2 weeks (around 06/17/2016) for Post-Procedure evaluation.  Future Appointments Date Time Provider [place]  06/26/2016 1:30 PM Milinda Pointer, MD [facility] None  08/26/2016 9:00 AM Vevelyn Francois, NP [facility] None   Medications ordered for procedure: Meds ordered this encounter  Medications  . lidocaine (PF) (XYLOCAINE) 1 % injection 10 mL  . ropivacaine (PF) 2 mg/mL (0.2%) (NAROPIN) injection 20 mg    Preservative-free (MPF), single use vial.  .  triamcinolone acetonide (KENALOG-40) injection 40 mg   Medications administered: We administered lidocaine (PF), ropivacaine (PF) 2 mg/mL (0.2%), and triamcinolone acetonide.  See the medical record for exact dosing, route, and time of administration.  Lab-work, Procedure(s), & Referral(s) Ordered: Orders Placed This Encounter  Procedures  . TRIGGER POINT INJECTION  . DG C-Arm 1-60 Min-No Report  . Discharge instructions  . Follow-up  . Informed Consent Details: Transcribe to consent form and obtain patient signature  . Provider attestation of informed consent for procedure/surgical case  . Verify informed consent   Imaging Ordered: Results for orders placed in visit on 06/03/16  DG C-Arm 1-60 Min-No Report   Narrative Fluoroscopy was utilized by the requesting physician.  No radiographic  interpretation.    New Prescriptions   No medications on file   Primary Care Physician: Madelyn Brunner, MD Location: [facility] Note by: Kathlen Brunswick. Dossie Arbour, M.D, DABA, DABAPM, DABPM, DABIPP, FIPP Date: 06/03/2016; Time: 5:07 PM  Disclaimer:  Medicine is not an Chief Strategy Officer. The only guarantee in medicine is that nothing is guaranteed. It is important to note that the decision to proceed with this intervention was based on the information collected from the patient. The Data and conclusions were drawn from the patient's questionnaire, the interview, and the physical examination. Because the information was provided in large part by the patient, it cannot be guaranteed that it has not been purposely or unconsciously manipulated. Every effort has been made to obtain as much relevant data as possible for this evaluation. It is important to note that the conclusions that lead to this procedure are derived in large part from the available data. Always take into account that the treatment will also be dependent on availability of resources and existing treatment  guidelines, considered by other Pain Management Practitioners as being common knowledge and practice, at the time of the intervention. For Medico-Legal purposes, it is also important to point out that variation in procedural techniques and pharmacological choices are the acceptable norm. The indications, contraindications, technique, and results of the above procedure should only be interpreted and judged by a Board-Certified Interventional Pain Specialist  with extensive familiarity and expertise in the same exact procedure and technique. Attempts at providing opinions without similar or greater experience and expertise than that of the treating physician will be considered as inappropriate and unethical, and shall result in a formal complaint to the state medical board and applicable specialty societies.  Instructions provided at this appointment: Patient Instructions  Post-Procedure instructions Instructions:  Apply ice: Fill a plastic sandwich bag with crushed ice. Cover it with a small towel and apply to injection site. Apply for 15 minutes then remove x 15 minutes. Repeat sequence on day of procedure, until you go to bed. The purpose is to minimize swelling and discomfort after procedure.  Apply heat: Apply heat to procedure site starting the day following the procedure. The purpose is to treat any soreness and discomfort from the procedure.  Food intake: Start with clear liquids (like water) and advance to regular food, as tolerated.   Physical activities: Keep activities to a minimum for the first 8 hours after the procedure.   Driving: If you have received any sedation, you are not allowed to drive for 24 hours after your procedure.  Blood thinner: Restart your blood thinner 6 hours after your procedure. (Only for those taking blood thinners)  Insulin: As soon as you can eat, you may resume your normal dosing schedule. (Only for those taking insulin)  Infection prevention: Keep procedure  site clean and dry.  Post-procedure Pain Diary: Extremely important that this be done correctly and accurately. Recorded information will be used to determine the next step in treatment.  Pain evaluated is that of treated area only. Do not include pain from an untreated area.  Complete every hour, on the hour, for the initial 8 hours. Set an alarm to help you do this part accurately.  Do not go to sleep and have it completed later. It will not be accurate.  Follow-up appointment: Keep your follow-up appointment after the procedure. Usually 2 weeks for most procedures. (6 weeks in the case of radiofrequency.) Bring you pain diary.  Expect:  From numbing medicine (AKA: Local Anesthetics): Numbness or decrease in pain.  Onset: Full effect within 15 minutes of injected.  Duration: It will depend on the type of local anesthetic used. On the average, 1 to 8 hours.   From steroids: Decrease in swelling or inflammation. Once inflammation is improved, relief of the pain will follow.  Onset of benefits: Depends on the amount of swelling present. The more swelling, the longer it will take for the benefits to be seen.   Duration: Steroids will stay in the system x 2 weeks. Duration of benefits will depend on multiple posibilities including persistent irritating factors.  From procedure: Some discomfort is to be expected once the numbing medicine wears off. This should be minimal if ice and heat are applied as instructed. Call if:  You experience numbness and weakness that gets worse with time, as opposed to wearing off.  New onset bowel or bladder incontinence. (Spinal procedures only)  Emergency Numbers:  [place] business hours (Monday - Thursday, 8:00 AM - 4:00 PM) (Friday, 9:00 AM - 12:00 Noon): [phone]  After hours: [phone]   __________________________________________________________________________________________

## 2016-06-03 NOTE — Patient Instructions (Signed)
Post-Procedure instructions Instructions:  Apply ice: Fill a plastic sandwich bag with crushed ice. Cover it with a small towel and apply to injection site. Apply for 15 minutes then remove x 15 minutes. Repeat sequence on day of procedure, until you go to bed. The purpose is to minimize swelling and discomfort after procedure.  Apply heat: Apply heat to procedure site starting the day following the procedure. The purpose is to treat any soreness and discomfort from the procedure.  Food intake: Start with clear liquids (like water) and advance to regular food, as tolerated.   Physical activities: Keep activities to a minimum for the first 8 hours after the procedure.   Driving: If you have received any sedation, you are not allowed to drive for 24 hours after your procedure.  Blood thinner: Restart your blood thinner 6 hours after your procedure. (Only for those taking blood thinners)  Insulin: As soon as you can eat, you may resume your normal dosing schedule. (Only for those taking insulin)  Infection prevention: Keep procedure site clean and dry.  Post-procedure Pain Diary: Extremely important that this be done correctly and accurately. Recorded information will be used to determine the next step in treatment.  Pain evaluated is that of treated area only. Do not include pain from an untreated area.  Complete every hour, on the hour, for the initial 8 hours. Set an alarm to help you do this part accurately.  Do not go to sleep and have it completed later. It will not be accurate.  Follow-up appointment: Keep your follow-up appointment after the procedure. Usually 2 weeks for most procedures. (6 weeks in the case of radiofrequency.) Bring you pain diary.  Expect:  From numbing medicine (AKA: Local Anesthetics): Numbness or decrease in pain.  Onset: Full effect within 15 minutes of injected.  Duration: It will depend on the type of local anesthetic used. On the average, 1 to 8  hours.   From steroids: Decrease in swelling or inflammation. Once inflammation is improved, relief of the pain will follow.  Onset of benefits: Depends on the amount of swelling present. The more swelling, the longer it will take for the benefits to be seen.   Duration: Steroids will stay in the system x 2 weeks. Duration of benefits will depend on multiple posibilities including persistent irritating factors.  From procedure: Some discomfort is to be expected once the numbing medicine wears off. This should be minimal if ice and heat are applied as instructed. Call if:  You experience numbness and weakness that gets worse with time, as opposed to wearing off.  New onset bowel or bladder incontinence. (Spinal procedures only)  Emergency Numbers:  [place] business hours (Monday - Thursday, 8:00 AM - 4:00 PM) (Friday, 9:00 AM - 12:00 Noon): [phone]  After hours: [phone]   __________________________________________________________________________________________

## 2016-06-03 NOTE — Progress Notes (Signed)
Safety precautions to be maintained throughout the outpatient stay will include: orient to surroundings, keep bed in low position, maintain call bell within reach at all times, provide assistance with transfer out of bed and ambulation.  

## 2016-06-04 ENCOUNTER — Telehealth: Payer: Self-pay | Admitting: *Deleted

## 2016-06-04 NOTE — Telephone Encounter (Signed)
Spoke with patient re; procedure, no questions or concerns.

## 2016-06-14 ENCOUNTER — Other Ambulatory Visit: Payer: Self-pay | Admitting: Pain Medicine

## 2016-06-14 DIAGNOSIS — M7918 Myalgia, other site: Secondary | ICD-10-CM

## 2016-06-26 ENCOUNTER — Ambulatory Visit: Payer: Medicare Other | Admitting: Pain Medicine

## 2016-07-17 ENCOUNTER — Telehealth: Payer: Self-pay | Admitting: Pain Medicine

## 2016-07-17 NOTE — Telephone Encounter (Signed)
Spoke with pharmacy and they had put the dosage in wrong for Oxycodone. Pharmacists states that patient needs Narcan order.  Informed her that Narcan had been ordered last July.  No further questions or concerns.

## 2016-07-17 NOTE — Telephone Encounter (Signed)
Please call [facility] regarding script for oxycodone that is doubled they have to confirm this is correct

## 2016-08-26 ENCOUNTER — Encounter: Payer: Self-pay | Admitting: Nurse Practitioner

## 2016-08-26 ENCOUNTER — Ambulatory Visit: Payer: Medicare Other | Attending: Nurse Practitioner | Admitting: Nurse Practitioner

## 2016-08-26 VITALS — BP 111/73 | HR 85 | Temp 98.3°F | Resp 16 | Ht 69.0 in | Wt 230.0 lb

## 2016-08-26 DIAGNOSIS — I471 Supraventricular tachycardia: Secondary | ICD-10-CM | POA: Insufficient documentation

## 2016-08-26 DIAGNOSIS — Z7901 Long term (current) use of anticoagulants: Secondary | ICD-10-CM | POA: Insufficient documentation

## 2016-08-26 DIAGNOSIS — E7801 Familial hypercholesterolemia: Secondary | ICD-10-CM | POA: Insufficient documentation

## 2016-08-26 DIAGNOSIS — M47816 Spondylosis without myelopathy or radiculopathy, lumbar region: Secondary | ICD-10-CM | POA: Insufficient documentation

## 2016-08-26 DIAGNOSIS — M79605 Pain in left leg: Secondary | ICD-10-CM | POA: Insufficient documentation

## 2016-08-26 DIAGNOSIS — E1122 Type 2 diabetes mellitus with diabetic chronic kidney disease: Secondary | ICD-10-CM | POA: Diagnosis not present

## 2016-08-26 DIAGNOSIS — M791 Myalgia: Secondary | ICD-10-CM | POA: Insufficient documentation

## 2016-08-26 DIAGNOSIS — I219 Acute myocardial infarction, unspecified: Secondary | ICD-10-CM | POA: Diagnosis not present

## 2016-08-26 DIAGNOSIS — M25519 Pain in unspecified shoulder: Secondary | ICD-10-CM | POA: Insufficient documentation

## 2016-08-26 DIAGNOSIS — M79602 Pain in left arm: Secondary | ICD-10-CM | POA: Diagnosis not present

## 2016-08-26 DIAGNOSIS — H8109 Meniere's disease, unspecified ear: Secondary | ICD-10-CM | POA: Insufficient documentation

## 2016-08-26 DIAGNOSIS — M5030 Other cervical disc degeneration, unspecified cervical region: Secondary | ICD-10-CM | POA: Insufficient documentation

## 2016-08-26 DIAGNOSIS — I6523 Occlusion and stenosis of bilateral carotid arteries: Secondary | ICD-10-CM | POA: Insufficient documentation

## 2016-08-26 DIAGNOSIS — N183 Chronic kidney disease, stage 3 (moderate): Secondary | ICD-10-CM | POA: Diagnosis not present

## 2016-08-26 DIAGNOSIS — Z981 Arthrodesis status: Secondary | ICD-10-CM | POA: Insufficient documentation

## 2016-08-26 DIAGNOSIS — M5126 Other intervertebral disc displacement, lumbar region: Secondary | ICD-10-CM | POA: Insufficient documentation

## 2016-08-26 DIAGNOSIS — I252 Old myocardial infarction: Secondary | ICD-10-CM | POA: Diagnosis not present

## 2016-08-26 DIAGNOSIS — I129 Hypertensive chronic kidney disease with stage 1 through stage 4 chronic kidney disease, or unspecified chronic kidney disease: Secondary | ICD-10-CM | POA: Diagnosis not present

## 2016-08-26 DIAGNOSIS — G894 Chronic pain syndrome: Secondary | ICD-10-CM | POA: Diagnosis not present

## 2016-08-26 DIAGNOSIS — R488 Other symbolic dysfunctions: Secondary | ICD-10-CM | POA: Insufficient documentation

## 2016-08-26 DIAGNOSIS — E0842 Diabetes mellitus due to underlying condition with diabetic polyneuropathy: Secondary | ICD-10-CM | POA: Diagnosis not present

## 2016-08-26 DIAGNOSIS — M542 Cervicalgia: Secondary | ICD-10-CM | POA: Diagnosis present

## 2016-08-26 DIAGNOSIS — K219 Gastro-esophageal reflux disease without esophagitis: Secondary | ICD-10-CM | POA: Insufficient documentation

## 2016-08-26 DIAGNOSIS — M546 Pain in thoracic spine: Secondary | ICD-10-CM | POA: Diagnosis present

## 2016-08-26 DIAGNOSIS — M7918 Myalgia, other site: Secondary | ICD-10-CM

## 2016-08-26 DIAGNOSIS — M4802 Spinal stenosis, cervical region: Secondary | ICD-10-CM | POA: Diagnosis not present

## 2016-08-26 DIAGNOSIS — M47892 Other spondylosis, cervical region: Secondary | ICD-10-CM | POA: Insufficient documentation

## 2016-08-26 DIAGNOSIS — I25110 Atherosclerotic heart disease of native coronary artery with unstable angina pectoris: Secondary | ICD-10-CM | POA: Diagnosis not present

## 2016-08-26 DIAGNOSIS — Z79891 Long term (current) use of opiate analgesic: Secondary | ICD-10-CM | POA: Insufficient documentation

## 2016-08-26 DIAGNOSIS — M4692 Unspecified inflammatory spondylopathy, cervical region: Secondary | ICD-10-CM | POA: Diagnosis not present

## 2016-08-26 DIAGNOSIS — E1165 Type 2 diabetes mellitus with hyperglycemia: Secondary | ICD-10-CM | POA: Insufficient documentation

## 2016-08-26 DIAGNOSIS — M50123 Cervical disc disorder at C6-C7 level with radiculopathy: Secondary | ICD-10-CM | POA: Insufficient documentation

## 2016-08-26 DIAGNOSIS — M792 Neuralgia and neuritis, unspecified: Secondary | ICD-10-CM | POA: Diagnosis not present

## 2016-08-26 DIAGNOSIS — Z794 Long term (current) use of insulin: Secondary | ICD-10-CM | POA: Diagnosis not present

## 2016-08-26 DIAGNOSIS — M47812 Spondylosis without myelopathy or radiculopathy, cervical region: Secondary | ICD-10-CM

## 2016-08-26 DIAGNOSIS — E1142 Type 2 diabetes mellitus with diabetic polyneuropathy: Secondary | ICD-10-CM | POA: Diagnosis not present

## 2016-08-26 DIAGNOSIS — J449 Chronic obstructive pulmonary disease, unspecified: Secondary | ICD-10-CM | POA: Insufficient documentation

## 2016-08-26 DIAGNOSIS — K2270 Barrett's esophagus without dysplasia: Secondary | ICD-10-CM | POA: Diagnosis not present

## 2016-08-26 DIAGNOSIS — Z7982 Long term (current) use of aspirin: Secondary | ICD-10-CM | POA: Insufficient documentation

## 2016-08-26 MED ORDER — OXYCODONE HCL 10 MG PO TABS: 10.0000 mg | ORAL_TABLET | Freq: Four times a day (QID) | ORAL | 0 refills | Status: DC | PRN

## 2016-08-26 MED ORDER — CYCLOBENZAPRINE HCL 10 MG PO TABS: 10.0000 mg | ORAL_TABLET | Freq: Three times a day (TID) | ORAL | 0 refills | Status: DC | PRN

## 2016-08-26 MED ORDER — GABAPENTIN 800 MG PO TABS: 1600.0000 mg | ORAL_TABLET | Freq: Two times a day (BID) | ORAL | 0 refills | Status: DC

## 2016-08-26 NOTE — Progress Notes (Signed)
Nursing Pain Medication Assessment:  Safety precautions to be maintained throughout the outpatient stay will include: orient to surroundings, keep bed in low position, maintain call bell within reach at all times, provide assistance with transfer out of bed and ambulation.  Medication Inspection Compliance: Pill count conducted under aseptic conditions, in front of the patient. Neither the pills nor the bottle was removed from the patient's sight at any time. Once count was completed pills were immediately returned to the patient in their original bottle.  Medication: Oxycodone IR Pill/Patch Count: 104 of 120 pills remain Pill/Patch Appearance: Markings consistent with prescribed medication Bottle Appearance: Standard pharmacy container. Clearly labeled. Filled Date: 07 / 11 / 2018 Last Medication intake:  Today

## 2016-08-26 NOTE — Progress Notes (Signed)
Patient's Name: Kenneth Osborn  MRN: [id number]  Referring Provider: Madelyn Brunner, MD  DOB: 04/07/1955  PCP: Madelyn Brunner, MD  DOS: 08/26/2016  Note by: Vevelyn Francois NP  Service setting: Ambulatory outpatient  Specialty: Interventional Pain Management  Location: [facility] (AMB) Pain Management Facility    Patient type: Established    Primary Reason(s) for Visit: Encounter for prescription drug management & post-procedure evaluation of chronic illness with mild to moderate exacerbation(Level of risk: moderate) CC: Back Pain (mid back, approx where the fusion was) and Neck Pain (middle)  HPI  Kenneth Osborn is a 61 y.o. year old, male patient, who comes today for a post-procedure evaluation and medication management. 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 Beverely Pace at [facility] on 01/04/2013); Long term current use of opiate analgesic; Long term prescription opiate use; Opiate use (60 MME/Day); Opiate dependence ([place]); Encounter for therapeutic drug level monitoring; Neurogenic pain; Neuropathic pain; Thoracic facet syndrome (T8-10); Lumbar facet syndrome (Bilateral); Cervical facet syndrome (right side); Cervical spondylosis; Lumbar spondylosis; Chronic pain of left upper extremity; Chronic cervical radicular pain (left side); Chronic upper back pain; History of thoracic spine surgery (S/P T9-10 IVD spacer); Failed back surgical syndrome; Musculoskeletal pain; Myofascial pain; Chronic left lower  extremity pain; Chronic radicular lumbar pain (left L4 dermatomal pain); Coronary artery disease; History of MI (myocardial infarction) (May 2012); Chronic anticoagulation (on [place]); History of Meniere's disease; Sleep apnea; Type 2 diabetes mellitus with hyperglycemia ([place]); Dysphagia; Encounter for interrogation of neurostimulator; [employer] spinal cord stimulator (implant date: 11/01/2014); Unstable angina ([place]); Controlled type 2 diabetes mellitus without complication ([place]); Arthralgia of shoulder; CAD (coronary artery disease); Paroxysmal supraventricular tachycardia ([place]); Balance problem; Chronic tension-type headache, not intractable; Dizziness; Post-concussion headache; Mild cognitive impairment; 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 His primarily concern today is the Back Pain (mid back, approx where the fusion was) and Neck Pain (middle)  Pain Assessment: Location: Mid Back (neck) Radiating: neck pain sometimes goes down Left arm.  back pain sometimes goes down left leg and occassionally on the right Onset: More than a month ago Duration: Chronic pain Quality: Sharp, Radiating (mid back; dull, aching, discomfort,  spinal cord stimulator site has sharp shooting pain. ) Severity: 2 /10 (self-reported pain score)  Note: Reported level is compatible with observation.                   Effect on ADL: patient feels that he has to compensate by being close to a counter for support, or walking close to walls in case he loses his balance.  patient does state he has a cane and walker but he tries not to use them.  Timing: Constant Modifying factors: medications, procedure  Kenneth Osborn was last seen  on 06/03/16 for a procedure. During today's appointment we reviewed Kenneth Osborn's post-procedure results, as  well as his outpatient medication regimen. He admits that since the trigger point injection that he has very well. He states that he has not had any pain at the SCS area.   Further details on both, my assessment(s), as well as the proposed treatment plan, please see below.  Controlled Substance Pharmacotherapy Assessment REMS (Risk Evaluation and Mitigation Strategy)  Analgesic:Oxycodone IR 10 mg every 6 hours (40 mg/day) MME/day:60 mg/day Janett Billow, RN  08/26/2016  9:31 AM  Sign at close encounter Nursing Pain Medication Assessment:  Safety precautions to be maintained throughout the outpatient stay will include: orient to surroundings, keep bed in low position, maintain call bell within reach at all times, provide assistance with transfer out of bed and ambulation.  Medication Inspection Compliance: Pill count conducted under aseptic conditions, in front of the patient. Neither the pills nor the bottle was removed from the patient's sight at any time. Once count was completed pills were immediately returned to the patient in their original bottle.  Medication: Oxycodone IR Pill/Patch Count: 104 of 120 pills remain Pill/Patch Appearance: Markings consistent with prescribed medication Bottle Appearance: Standard pharmacy container. Clearly labeled. Filled Date: 07 / 11 / 2018 Last Medication intake:  Today   Pharmacokinetics: Liberation and absorption (onset of action): WNL Distribution (time to peak effect): WNL Metabolism and excretion (duration of action): WNL         Pharmacodynamics: Desired effects: Analgesia: Kenneth Osborn reports >50% benefit. Functional ability: Patient reports that medication allows him to accomplish basic ADLs Clinically meaningful improvement in function (CMIF): Sustained CMIF goals met Perceived effectiveness: Described as relatively effective,  allowing for increase in activities of daily living (ADL) Undesirable effects: Side-effects or Adverse reactions: None reported Monitoring: [place] PMP: Online review of the past 65-monthperiod conducted. Compliant with practice rules and regulations List of all UDS test(s) done:  Lab Results  Component Value Date   TOXASSSELUR FINAL 11/27/2015   T[place]FINAL 06/07/2015   TE[place]aineFINAL 03/22/2015   [place]ph[place]FINAL 12/21/2014   SUMMARY FINAL 05/28/2016   Last UDS on record: ToxAssure Select 13  Date Value Ref Range Status  11/27/2015 FINAL  Final    Comment:    ==================================================================== TOXASSURE SELECT 13 (MW) ==================================================================== Test                             Result       Flag       Units Drug Present and Declared for Prescription Verification   Desmethyldiazepam              271          EXPECTED   ng/mg creat   Oxazepam                       872          EXPECTED   ng/mg creat   Temazepam                      484          EXPECTED   ng/mg creat    Desmethyldiazepam, oxazepam, and temazepam are expected    metabolites of diazepam. Desmethyldiazepam and oxazepam are also    expected metabolites of other drugs, including chlordiazepoxide,    prazepam, clorazepate, and halazepam. Oxazepam is an expected    metabolite of temazepam. Oxazepam and temazepam are also  available as scheduled prescription medications.   Oxycodone                      1232         EXPECTED   ng/mg creat   Oxymorphone                    2523         EXPECTED   ng/mg creat   Noroxycodone                   1120         EXPECTED   ng/mg creat   Noroxymorphone                 961          EXPECTED   ng/mg creat    Sources of oxycodone are scheduled prescription medications.    Oxymorphone, noroxycodone, and noroxymorphone are expected    metabolites of oxycodone. Oxymorphone is also available as a     scheduled prescription medication. Drug Absent but Declared for Prescription Verification   Alprazolam                     Not Detected UNEXPECTED ng/mg creat ==================================================================== Test                      Result    Flag   Units      Ref Range   Creatinine              120              mg/dL      >=20 ==================================================================== Declared Medications:  The flagging and interpretation on this report are based on the  following declared medications.  Unexpected results may arise from  inaccuracies in the declared medications.  **Note: The testing scope of this panel includes these medications:  Alprazolam (Xanax)  Diazepam (Valium)  Oxycodone  **Note: The testing scope of this panel does not include following  reported medications:  Aspirin  Cetirizine (Zyrtec)  Cyclobenzaprine (Flexeril)  Diltiazem (Cardizem CD)  Enalapril (Vasotec)  Ezetimibe (Zetia)  Gabapentin  Hydrochlorothiazide (Maxzide)  Insulin (Humalog)  Magnesium Oxide  Meclizine (Antivert)  Metformin  Metoprolol (Toprol)  Montelukast (Singulair)  Naloxone (Narcan)  Nortriptyline (Pamelor)  Omega-3 Fatty Acids (Fish Oil)  Omeprazole (Nexium)  Prasugrel (Effient)  Ranitidine (Zantac)  Rosuvastatin (Crestor)  Triamterene (Maxzide) ==================================================================== For clinical consultation, please call [phone]. ====================================================================    Summary  Date Value Ref Range Status  05/28/2016 FINAL  Final    Comment:    ==================================================================== TOXASSURE SELECT 13 (MW) ==================================================================== Test                             Result       Flag       Units Drug Present and Declared for Prescription Verification   Desmethyldiazepam              274           EXPECTED   ng/mg creat   Oxazepam                       826          EXPECTED   ng/mg creat   Temazepam  363          EXPECTED   ng/mg creat    Desmethyldiazepam, oxazepam, and temazepam are expected    metabolites of diazepam. Desmethyldiazepam and oxazepam are also    expected metabolites of other drugs, including chlordiazepoxide,    prazepam, clorazepate, and halazepam. Oxazepam is an expected    metabolite of temazepam. Oxazepam and temazepam are also    available as scheduled prescription medications.   Oxycodone                      2390         EXPECTED   ng/mg creat   Oxymorphone                    3491         EXPECTED   ng/mg creat   Noroxycodone                   1327         EXPECTED   ng/mg creat   Noroxymorphone                 3689         EXPECTED   ng/mg creat    Sources of oxycodone are scheduled prescription medications.    Oxymorphone, noroxycodone, and noroxymorphone are expected    metabolites of oxycodone. Oxymorphone is also available as a    scheduled prescription medication. Drug Absent but Declared for Prescription Verification   Alprazolam                     Not Detected UNEXPECTED ng/mg creat ==================================================================== Test                      Result    Flag   Units      Ref Range   Creatinine              147              mg/dL      >=20 ==================================================================== Declared Medications:  The flagging and interpretation on this report are based on the  following declared medications.  Unexpected results may arise from  inaccuracies in the declared medications.  **Note: The testing scope of this panel includes these medications:  Alprazolam (Xanax)  Diazepam (Valium)  Oxycodone  **Note: The testing scope of this panel does not include following  reported medications:  Aspirin  Cetirizine (Zyrtec)  Cyclobenzaprine (Flexeril)  Diltiazem (Cardizem  CD)  Enalapril (Vasotec)  Ezetimibe (Zetia)  Gabapentin  Hydrochlorothiazide (Maxzide)  Insulin (Humalog)  Insulin (Lantus)  Magnesium Oxide  Meclizine (Antivert)  Metformin (Glucophage)  Methylprednisolone (Medrol Dose Pack)  Metoprolol (Toprol)  Montelukast (Singulair)  Naloxone (Narcan)  Niacin (Niaspan)  Nortriptyline (Pamelor)  Omega-3 Fatty Acids (Fish Oil)  Omeprazole (Nexium)  Prasugrel (Effient)  Ranitidine (Zantac)  Rosuvastatin (Crestor)  Triamterene (Maxzide) ==================================================================== For clinical consultation, please call [phone]. ====================================================================    UDS interpretation: Compliant          Medication Assessment Form: Reviewed. Patient indicates being compliant with therapy Treatment compliance: Compliant Risk Assessment Profile: Aberrant behavior: See prior evaluations. None observed or detected today Comorbid factors increasing risk of overdose: See prior notes. No additional risks detected today Risk of substance use disorder (SUD): Low Opioid Risk Tool (ORT) Total Score: 0  Interpretation Table:  Score <3 = Low Risk for SUD  Score  between 4-7 = Moderate Risk for SUD  Score >8 = High Risk for Opioid Abuse   Risk Mitigation Strategies:  Patient Counseling: Covered Patient-Prescriber Agreement (PPA): Present and active  Notification to other healthcare providers: Done  Pharmacologic Plan: No change in therapy, at this time  Post-Procedure Assessment  Visit date Procedure: 06/03/16 Pre-procedure pain score:  4/10 Post-procedure pain score: 0/10         Influential Factors: BMI: 33.97 kg/m Intra-procedural challenges: None observed Assessment challenges: None detected         Post-procedural adverse reactions or complications: None reported Reported side-effects: None  Sedation: Please see nurses note. When no sedatives are used, the analgesic  levels obtained are directly associated to the effectiveness of the local anesthetics. However, when sedation is provided, the level of analgesia obtained during the initial 1 hour following the intervention, is believed to be the result of a combination of factors. These factors may include, but are not limited to: 1. The effectiveness of the local anesthetics used. 2. The effects of the analgesic(s) and/or anxiolytic(s) used. 3. The degree of discomfort experienced by the patient at the time of the procedure. 4. The patients ability and reliability in recalling and recording the events. 5. The presence and influence of possible secondary gains and/or psychosocial factors. Reported result: Relief experienced during the 1st hour after the procedure: 100 % (Ultra-Short Term Relief) Interpretative annotation: Analgesia during this period is likely to be Local Anesthetic and/or IV Sedative (Analgesic/Anxiolitic) related.          Effects of local anesthetic: The analgesic effects attained during this period are directly associated to the localized infiltration of local anesthetics and therefore cary significant diagnostic value as to the etiological location, or anatomical origin, of the pain. Expected duration of relief is directly dependent on the pharmacodynamics of the local anesthetic used. Long-acting (4-6 hours) anesthetics used.  Reported result: Relief during the next 4 to 6 hour after the procedure: 100 % (Short-Term Relief) Interpretative annotation: Complete relief would suggest area to be the source of the pain.          Long-term benefit: Defined as the period of time past the expected duration of local anesthetics. With the possible exception of prolonged sympathetic blockade from the local anesthetics, benefits during this period are typically attributed to, or associated with, other factors such as analgesic sensory neuropraxia, antiinflammatory effects, or beneficial biochemical changes  provided by agents other than the local anesthetics Reported result: Extended relief following procedure: 100 % (Long-Term Relief) Interpretative annotation: Good relief. This could suggest inflammation to be a significant component in the etiology to the pain.          Current benefits: Defined as persistent relief that continues at this point in time.   Reported results: Treated area: 100 %       Interpretative annotation: Ongoing benefits would suggest effective therapeutic approach  Interpretation: Results would suggest a successful diagnostic intervention.          Laboratory Chemistry  Inflammation Markers (CRP: Acute Phase) (ESR: Chronic Phase) Lab Results  Component Value Date   CRP 1.4 (H) 03/22/2015   ESRSEDRATE 9 03/22/2015                 Renal Function Markers Lab Results  Component Value Date   BUN 20 06/16/2015   CREATININE 1.24 06/16/2015   GFRAA >60 06/16/2015   GFRNONAA >60 06/16/2015  Hepatic Function Markers Lab Results  Component Value Date   AST 27 03/22/2015   ALT 34 03/22/2015   ALBUMIN 4.1 03/22/2015   ALKPHOS 84 03/22/2015                 Electrolytes Lab Results  Component Value Date   NA 139 06/16/2015   K 4.7 06/16/2015   CL 107 06/16/2015   CALCIUM 9.5 06/16/2015   MG 1.7 03/22/2015                 Neuropathy Markers No results found for: [id number]               Bone Pathology Markers Lab Results  Component Value Date   ALKPHOS 84 03/22/2015   CALCIUM 9.5 06/16/2015                 Coagulation Parameters Lab Results  Component Value Date   INR 1.0 02/23/2014   LABPROT 12.9 02/23/2014   APTT 25.9 08/18/2013   PLT 180 06/16/2015                 Cardiovascular Markers Lab Results  Component Value Date   HGB 13.1 06/16/2015   HCT 38.5 (L) 06/16/2015                 Note: Lab results reviewed.  Recent Diagnostic Imaging Review  Dg C-arm 1-60 Min-no Report  Result Date: 06/03/2016 Fluoroscopy was  utilized by the requesting physician.  No radiographic interpretation.   Note: Imaging results reviewed.          Meds   Current Meds  Medication Sig  . ACCU-CHEK AVIVA PLUS test strip   . ALPRAZolam (XANAX) 0.5 MG tablet Take 0.5 mg by mouth at bedtime as needed (Meniere's disease).   Marland Kitchen aspirin EC 81 MG tablet Take 81 mg by mouth at bedtime.   . cetirizine (ZYRTEC) 10 MG tablet Take 10 mg by mouth daily.  . cyclobenzaprine (FLEXERIL) 10 MG tablet Take 1 tablet (10 mg total) by mouth every 8 (eight) hours as needed for muscle spasms.  . diazepam (VALIUM) 5 MG tablet Take 5 mg by mouth 3 (three) times daily as needed (Meniere's disease).   Marland Kitchen diltiazem (CARTIA XT) 240 MG 24 hr capsule Take 240 mg by mouth at bedtime.  . enalapril (VASOTEC) 10 MG tablet Take 10 mg by mouth 2 (two) times daily.  Marland Kitchen esomeprazole (NEXIUM) 40 MG capsule Take 40 mg by mouth 2 (two) times daily.  Marland Kitchen ezetimibe (ZETIA) 10 MG tablet Take 10 mg by mouth 2 (two) times daily.   Marland Kitchen gabapentin (NEURONTIN) 800 MG tablet Take 2 tablets (1,600 mg total) by mouth 2 (two) times daily.  . insulin glargine (LANTUS) 100 UNIT/ML injection Inject 100 Units into the skin at bedtime.   . insulin lispro (HUMALOG KWIKPEN) 100 UNIT/ML KiwkPen Inject 30 Units into the skin daily before lunch.   . Insulin Lispro Prot & Lispro (HUMALOG MIX 75/25 KWIKPEN) (75-25) 100 UNIT/ML Kwikpen Inject 60 Units into the skin 2 (two) times daily with a meal.  . Insulin Syringe-Needle U-100 (BD INSULIN SYRINGE ULTRAFINE) 31G X 15/64" 1 ML MISC   . magnesium oxide (MAG-OX) 400 MG tablet Take 400 mg by mouth daily.   . meclizine (ANTIVERT) 25 MG tablet Take 25 mg by mouth 3 (three) times daily as needed for dizziness.   . metFORMIN (GLUCOPHAGE) 1000 MG tablet Take 1,000 mg by mouth 2 (two) times daily with  a meal.  . metoprolol succinate (TOPROL-XL) 50 MG 24 hr tablet Take 50 mg by mouth 2 (two) times daily. Take with or immediately following a meal.   .  montelukast (SINGULAIR) 10 MG tablet Take by mouth daily.   . naloxone (NARCAN) 2 MG/2ML injection Inject content of syringe into thigh muscle. Call 911.  . niacin (NIASPAN) 500 MG CR tablet Take 500 mg by mouth at bedtime.  . Omega-3 Fatty Acids (FISH OIL) 1000 MG CAPS Take 1,200 mg by mouth 2 (two) times daily.   . Oxycodone HCl 10 MG TABS Take 1 tablet (10 mg total) by mouth every 6 (six) hours as needed.  . prasugrel (EFFIENT) 10 MG TABS Take 10 mg by mouth daily.  . ranitidine (ZANTAC) 150 MG tablet Take 150 mg by mouth at bedtime.   . rosuvastatin (CRESTOR) 20 MG tablet Take 20 mg by mouth at bedtime.  . triamterene-hydrochlorothiazide (MAXZIDE) 75-50 MG tablet Take 1 tablet by mouth daily.     ROS  Constitutional: Denies any fever or chills Gastrointestinal: No reported hemesis, hematochezia, vomiting, or acute GI distress Musculoskeletal: Denies any acute onset joint swelling, redness, loss of ROM, or weakness Neurological: No reported episodes of acute onset apraxia, aphasia, dysarthria, agnosia, amnesia, paralysis, loss of coordination, or loss of consciousness  Allergies  Kenneth Osborn has No Known Allergies.  PFSH  Drug: Kenneth Osborn  reports that he does not use drugs. Alcohol:  reports that he does not drink alcohol. Tobacco:  reports that he has never smoked. He has never used smokeless tobacco. Medical:  has a past medical history of Allergic rhinitis (12/30/2012); Bronchitis; Can't get food down (08/12/2014); Chronic back pain; Concussion (09/2015); COPD (chronic obstructive pulmonary disease) ([place]); Coronary artery disease; DDD (degenerative disc disease), cervical; Diabetes mellitus without complication ([place]); Dysphagia; GERD (gastroesophageal reflux disease); History of Meniere's disease (12/21/2014); History of thoracic spine surgery (S/P T9-10 IVD spacer) (12/21/2014); Hypercholesteremia; Hyperlipidemia; Hypertension; Meniere's disease; Myocardial infarction ([place]); Neuromuscular  disorder ([place]); Psychosis; Short-segment Barrett's esophagus; and Sleep apnea. Surgical: Kenneth Osborn  has a past surgical history that includes Cardiovascular stress test; Cardiac catheterization; Coronary angioplasty; Labrinthectomy; mastoid shunt; Appendectomy; Back surgery; Shoulder arthroscopy with subacromial decompression (Left, 04/06/2012); ARTHRODESIS ANTERIOR ANTERIOR CERVICLE SPINE (01/04/2013); Colonoscopy with propofol (N/A, 09/19/2014); Esophagogastroduodenoscopy (N/A, 09/19/2014); Savory dilation (N/A, 09/19/2014); Spinal cord stimulator implant (Right); Cardiac catheterization (N/A, 06/15/2015); and Cardiac catheterization (N/A, 06/15/2015). Family: family history includes Cancer in his sister; Diabetes in his mother; Heart disease in his father, maternal aunt, maternal uncle, and mother.  Constitutional Exam  General appearance: Well nourished, well developed, and well hydrated. In no apparent acute distress Vitals:   08/26/16 0927  BP: 111/73  Pulse: 85  Resp: 16  Temp: 98.3 F (36.8 C)  TempSrc: Oral  SpO2: 98%  Weight: 230 lb (104.3 kg)  Height: 5' 9"  (1.753 m)   BMI Assessment: Estimated body mass index is 33.97 kg/m as calculated from the following:   Height as of this encounter: 5' 9"  (1.753 m).   Weight as of this encounter: 230 lb (104.3 kg).  BMI interpretation table: BMI level Category Range association with higher incidence of chronic pain  <18 kg/m2 Underweight   18.5-24.9 kg/m2 Ideal body weight   25-29.9 kg/m2 Overweight Increased incidence by 20%  30-34.9 kg/m2 Obese (Class I) Increased incidence by 68%  35-39.9 kg/m2 Severe obesity (Class II) Increased incidence by 136%  >40 kg/m2 Extreme obesity (Class III) Increased incidence by 254%   BMI  Readings from Last 4 Encounters:  08/26/16 33.97 kg/m  06/03/16 33.23 kg/m  05/28/16 33.97 kg/m  02/29/16 33.23 kg/m   Wt Readings from Last 4 Encounters:  08/26/16 230 lb (104.3 kg)  06/03/16 225 lb (102.1 kg)   05/28/16 230 lb (104.3 kg)  02/29/16 225 lb (102.1 kg)  Psych/Mental status: oriented x 3 (person, place, & time)  Easily aroused but does seem oversedated.    Eyes: PERLA Respiratory: No evidence of acute respiratory distress  Cervical Spine Exam  Inspection: No masses, redness, or swelling Alignment: Symmetrical Functional ROM: Unrestricted ROM      Stability: No instability detected Muscle strength & Tone: Functionally intact Sensory: Unimpaired Palpation: Complains of area being tender to palpation              Upper Extremity (UE) Exam    Side: Right upper extremity  Side: Left upper extremity  Inspection: No masses, redness, swelling, or asymmetry. No contractures  Inspection: No masses, redness, swelling, or asymmetry. No contractures  Functional ROM: Unrestricted ROM          Functional ROM: Unrestricted ROM          Muscle strength & Tone: Functionally intact  Muscle strength & Tone: Functionally intact  Sensory: Unimpaired  Sensory: Unimpaired  Palpation: No palpable anomalies              Palpation: No palpable anomalies              Specialized Test(s): Deferred         Specialized Test(s): Deferred          Thoracic Spine Exam  Inspection: No masses, redness, or swelling Alignment: Symmetrical Functional ROM: Unrestricted ROM Stability: No instability detected Sensory: Unimpaired Muscle strength & Tone: No palpable anomalies  Lumbar Spine Exam  Inspection: No masses, redness, or swelling Alignment: Symmetrical Functional ROM: Unrestricted ROM      Stability: No instability detected Muscle strength & Tone: Functionally intact Sensory: Unimpaired Palpation: No palpable anomalies       Provocative Tests: Lumbar Hyperextension and rotation test: evaluation deferred today       Patrick's Maneuver: evaluation deferred today                    Gait & Posture Assessment  Ambulation: Unassisted Gait: Relatively normal for age and body habitus Posture: WNL    Lower Extremity Exam    Side: Right lower extremity  Side: Left lower extremity  Inspection: No masses, redness, swelling, or asymmetry. No contractures  Inspection: No masses, redness, swelling, or asymmetry. No contractures  Functional ROM: Unrestricted ROM          Functional ROM: Unrestricted ROM          Muscle strength & Tone: Functionally intact  Muscle strength & Tone: Functionally intact  Sensory: Unimpaired  Sensory: Unimpaired  Palpation: No palpable anomalies  Palpation: No palpable anomalies   Assessment  Primary Diagnosis & Pertinent Problem List: Diagnoses of Neuropathic pain, Diabetic polyneuropathy associated with diabetes mellitus due to underlying condition ([place]), Chronic pain syndrome, and Musculoskeletal pain were pertinent to this visit.  Status Diagnosis  Controlled Controlled Controlled 1. Neuropathic pain   2. Diabetic polyneuropathy associated with diabetes mellitus due to underlying condition ([place])   3. Chronic pain syndrome   4. 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 Beverely Pace at [facility] on 01/04/2013)  Long Term Prescription Opiate Use  Neurogenic Pain  Neuropathic Pain  Thoracic facet syndrome (T8-10)  Lumbar facet syndrome (Bilateral)  Cervical facet syndrome (right side)  Cervical Spondylosis   C5-6 degenerative endplate osteophyte, annular bulge, left neural foraminal narrowing. C6-7 degenerative endplate osteophyte, annular bulge, bilateral moderate neural foraminal narrowing, central disc protrusion. C7-T1 annular bulge, degenerative endplate osteophyte, and bilateral neural foraminal narrowing.   Lumbar Spondylosis   L3-4, L4-5, and L5-S1 disc bulge. L3-4 facet hypertrophy. L4-L5 and L5-S1 disc tears.   Chronic Pain of Left Upper Extremity  Chronic cervical radicular pain (left side)   Chronic Upper Back Pain  History of thoracic spine surgery (S/P T9-10 IVD spacer)  Failed Back Surgical Syndrome  Musculoskeletal Pain  Myofascial Pain  Chronic left lower extremity pain  Chronic radicular lumbar pain (left L4 dermatomal pain)  Chronic anticoagulation (on Effient)  Diabetic Polyneuropathy (Hcc)  Cervical Spinal Stenosis  Nerve Root Pain  Long Term Current Use of Opiate Analgesic  Opiate use (60 MME/Day)   Oxycodone IR 10 mg every 6 hours (40 mg/day)   Opiate Dependence (Hcc)  Pain From Implanted Hardware  Pain, Arm, Left  Bilateral Carotid Artery Stenosis   Overview:  Less than 50% 2017   Mild Cognitive Impairment  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 And Lcx 2015 and mid RCA 2017 RESTENOSIS OF THE LAD STENT 11/2010 REQUIRING BALLOON ANGIOPLASTY   Last Assessment & Plan:  Relevant Hx: Course: Daily Update: Today's Plan:   Controlled Type 2 Diabetes Mellitus Without Complication (Hcc)  Unstable Angina (Hcc)  Encounter for Interrogation of Neurostimulator  [employer]tron[employer] spinal cord stimulator (implant date: 11/01/2014)   Medtronic spinal cord stimulator system. Implant date: 11/01/2014 Serial #: [id number] Model #: [id number] Implanter & Emergency Physician contact: Dr. Leonia Corona, [facility] Physician contact number: [phone] MRI compatible: Yes Medtr[employer] contact number for changes: [phone]   Type 2 Diabetes Mellitus With Hyperglycemia (Hcc)  Uncontrolled Type 2 Diabetes Mellitus With Hyperglycemia, With Long-Term Current Use of Insulin (Hcc)  DM Type 2 With Diabetic Peripheral Neuropathy (Hcc)  Type 2 Diabetes Mellitus (Hcc)  Acid Reflux  Obstructive Apnea   S/P Insertion of Spinal Cord Stimulator  Encounter for Therapeutic Drug Level Monitoring  Coronary Artery Disease  History of MI (myocardial infarction) (May 2012)  History of Meniere's Disease  Sleep Apnea  History of Spinal Surgery  Chronic Kidney Disease (Ckd), Stage III (Moderate)  Drug-Induced Hypotension  Benign Essential Htn  Barrett Esophagus  H/O Adenomatous Polyp of Colon  Reflux  Arteriosclerosis of Coronary Artery   Overview:   PCI AND STENT PLACEMENT OF LAD WITH A DRUG ELUTING STENT 06/2010. And Lcx 2015 and mid RCA 2017 RESTENOSIS OF THE LAD STENT 11/2010 REQUIRING BALLOON ANGIOPLASTY   Last Assessment & Plan:  Relevant Hx: Course: Daily Update: Today's Plan: Overview:  WITH PCI AND STENT PLACEMENT OF LAD WITH A DRUG ELUTING STENT 06/2010. RESTENOSIS OF THE LAD STENT 11/2010 REQUIRING BALLOON ANGIOPLASTY. RECATHED 02/2011, WITH PROGRESSION OF DISEASE WITH A 70% STENOSIS OF A SMALL LIFT CIRCUMFLEX ARTERY WITH PCi and stent 2015  Last Assessment & Plan:  Relevant Hx: Course: Daily Update: Today's Plan:  Combined Fat and Carbohydrate Induced Hyperlipemia  Auditory Vertigo  Arthralgia of Shoulder   Plan of Care  Pharmacotherapy (Medications Ordered): No orders of the defined types were placed in this encounter.  New Prescriptions   No medications on file   Medications administered today: Kenneth Osborn had no medications administered during this visit. Lab-work, procedure(s), and/or referral(s): No orders of the defined types were placed in this encounter.  Imaging and/or referral(s): None  Interventional therapies: Planned, scheduled, and/or pending:   None at this time.   Considering:   None at this time.   Palliative PRN treatment(s):   Right side posterior superior iliac spine Trigger Point Injection   Provider-requested follow-up: Return in about 3 months (around 11/26/2016) for MedMgmt.  No future appointments. Primary Care Physician:  Madelyn Brunner, MD Location: [facility] Note by: Vevelyn Francois NP Date: 08/26/2016; Time: 10:09 AM  Pain Score Disclaimer: We use the NRS-11 scale. This is a self-reported, subjective measurement of pain severity with only modest accuracy. It is used primarily to identify changes within a particular patient. It must be understood that outpatient pain scales are significantly less accurate that those used for research, where they can be applied under ideal controlled circumstances with minimal exposure to variables. In reality, the score is likely to be a combination of pain intensity and pain affect, where pain affect describes the degree of emotional arousal or changes in action readiness caused by the sensory experience of pain. Factors such as social and work situation, setting, emotional state, anxiety levels, expectation, and prior pain experience may influence pain perception and show large inter-individual differences that may also be affected by time variables.  Patient instructions provided during this appointment: Patient Instructions   ____________________________________________________________________________________________  Medication Rules  Applies to: All patients receiving prescriptions (written or electronic).  Pharmacy of record: Pharmacy where electronic prescriptions will be sent. If written prescriptions are taken to a different pharmacy, please inform the nursing staff. The pharmacy listed in the electronic medical record should be the one where you would like electronic prescriptions to be sent.  Prescription refills: Only during scheduled appointments. Applies to both, written and electronic prescriptions.  NOTE: The following applies primarily to controlled substances (Opioid* Pain Medications).   Patient's responsibilities: 1. Pain Pills: Bring all pain pills to every appointment (except for procedure appointments). 2. Pill  Bottles: Bring pills in original pharmacy bottle. Always bring newest bottle. Bring bottle, even if empty. 3. Medication refills: You are responsible for knowing and keeping track of what medications you need refilled. The day before your appointment, write a list of all prescriptions that need to be refilled. Bring that list to your appointment and give it to the admitting nurse. Prescriptions will be written only during appointments. If you forget a medication, it will not be "Called in", "Faxed", or "electronically sent". You will need to get another appointment to get these prescribed. 4. Prescription Accuracy: You are responsible for carefully inspecting your prescriptions before leaving our office. Have the discharge nurse carefully go over each prescription with you, before taking them home. Make sure that your name is accurately spelled, that your address is correct. Check the name and dose of your medication to make sure it is accurate. Check the number of pills, and the written instructions to make sure they are clear and accurate. Make sure that you are given enough medication to last until your next medication refill appointment. 5. Taking Medication: Take medication  as prescribed. Never take more pills than instructed. Never take medication more frequently than prescribed. Taking less pills or less frequently is permitted and encouraged, when it comes to controlled substances (written prescriptions).  6. Inform other Doctors: Always inform, all of your healthcare providers, of all the medications you take. 7. Pain Medication from other Providers: You are not allowed to accept any additional pain medication from any other Doctor or Healthcare provider. There are two exceptions to this rule. (see below) In the event that you require additional pain medication, you are responsible for notifying us, as stated below. 8. Medication Agreement: You are responsible for carefully reading and following our  Medication Agreement. This must be signed before receiving any prescriptions from our practice. Safely store a copy of your signed Agreement. Violations to the Agreement will result in no further prescriptions. (Additional copies of our Medication Agreement are available upon request.) 9. Laws, Rules, & Regulations: All patients are expected to follow all Federal and [employer], Rules, [employer]. Ignorance of the Laws does not constitute a valid excuse. The use of any illegal substances is prohibited. 10. Adopted CDC guidelines & recommendations: Target dosing levels will be at or below 60 MME/day. Use of benzodiazepines** is not recommended.  Exceptions: There are only two exceptions to the rule of not receiving pain medications from other Healthcare Providers. 1. Exception #1 (Emergencies): In the event of an emergency (i.e.: accident requiring emergency care), you are allowed to receive additional pain medication. However, you are responsible for: As soon as you are able, call our office [phone], at any time of the day or night, and leave a message stating your name, the date and nature of the emergency, and the name and dose of the medication prescribed. In the event that your call is answered by a member of our staff, make sure to document and save the date, time, and the name of the person that took your information.  2. Exception #2 (Planned Surgery): In the event that you are scheduled by another doctor or dentist to have any type of surgery or procedure, you are allowed (for a period no longer than 30 days), to receive additional pain medication, for the acute post-op pain. However, in this case, you are responsible for picking up a copy of our "Post-op Pain Management for Surgeons" handout, and giving it to your surgeon or dentist. This document is available at our office, and does not require an appointment to obtain it. Simply go to our office during business hours  (Monday-Thursday from 8:00 AM to 4:00 PM) (Friday 8:00 AM to 12:00 Noon) or if you have a scheduled appointment with [name], prior to your surgery, and ask for it by name. In addition, you will need to provide [name] with your name, name of your surgeon, type of surgery, and date of procedure or surgery.  *Opioid medications include: morphine, codeine, oxycodone, oxymorphone, hydrocodone, hydromorphone, meperidine, tramadol, tapentadol, buprenorphine, fentanyl, methadone. **Benzodiazepine medications include: diazepam (Valium), alprazolam (Xanax), clonazepam (Klonopine), lorazepam (Ativan), clorazepate (Tranxene), chlordiazepoxide (Librium), estazolam (Prosom), oxazepam (Serax), temazepam (Restoril), triazolam (Halcion)  ____________________________________________________________________________________________

## 2016-08-26 NOTE — Patient Instructions (Signed)

## 2016-08-26 NOTE — Progress Notes (Deleted)
Patient's Name: Kenneth Osborn  MRN: [id number]  Referring Provider: Madelyn Brunner, MD  DOB: November 07, 1955  PCP: Madelyn Brunner, MD  DOS: 08/26/2016  Note by: Vevelyn Francois NP  Service setting: Ambulatory outpatient  Specialty: Interventional Pain Management  Location: [facility] (AMB) Pain Management Facility    Patient type: Established    Primary Reason(s) for Visit: Encounter for prescription drug management. (Level of risk: moderate)  CC: No chief complaint on file.  HPI  [name] is a 61 y.o. year old, male patient, who comes today for a medication management evaluation. 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 Beverely Pace at [facility] on 01/04/2013); Long term current use of opiate analgesic; Long term prescription opiate use; Opiate use (60 MME/Day); Opiate dependence ([place]); Encounter for therapeutic drug level monitoring; Neurogenic pain; Neuropathic pain; Thoracic facet syndrome (T8-10); Lumbar facet syndrome (Bilateral); Cervical facet syndrome (right side); Cervical spondylosis; Lumbar spondylosis; Chronic pain of left upper extremity; Chronic cervical radicular pain (left side); Chronic upper back pain; History of thoracic spine surgery (S/P T9-10 IVD spacer); Failed back surgical syndrome; Musculoskeletal pain; Myofascial pain; Chronic left lower extremity pain; Chronic radicular lumbar pain (left L4 dermatomal pain); Coronary artery disease; History of MI (myocardial infarction) (May 2012);  Chronic anticoagulation (on [place]); History of Meniere's disease; Sleep apnea; Type 2 diabetes mellitus with hyperglycemia ([place]); Dysphagia; Encounter for interrogation of neurostimulator; [employer] spinal cord stimulator (implant date: 11/01/2014); Unstable angina ([place]); Controlled type 2 diabetes mellitus without complication ([place]); Arthralgia of shoulder; CAD (coronary artery disease); Paroxysmal supraventricular tachycardia ([place]); Balance problem; Chronic tension-type headache, not intractable; Dizziness; Post concussion syndrome; Mild cognitive impairment; 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 His primarily concern today is the No chief complaint on file.  Pain Assessment: Location:     Radiating:   Onset:   Duration:   Quality:   Severity:  /10 (self-reported pain score)  Note: Reported level is compatible with observation.                   Effect on ADL:   Timing:   Modifying factors:    [name] was last scheduled for an appointment on Visit date not found for medication management. During today's appointment we reviewed [name]'s chronic pain status, as well as his outpatient medication regimen.  The patient  reports that he does not use drugs. His body mass index is unknown because there is no height or weight on file.  Further details on both, my assessment(s), as well as the proposed treatment plan, please see below.  Controlled Substance Pharmacotherapy Assessment REMS (Risk Evaluation and Mitigation Strategy)  Analgesic:Oxycodone IR 10 mg every 6 hours (40 mg/day) MME/day:60 mg/day  No notes on file Pharmacokinetics: Liberation and absorption (onset of action):  WNL Distribution (time to peak effect): WNL Metabolism and excretion (duration  of action): WNL         Pharmacodynamics: Desired effects: Analgesia: [name] reports >50% benefit. Functional ability: Patient reports that medication allows him to accomplish basic ADLs Clinically meaningful improvement in function (CMIF): Sustained CMIF goals met Perceived effectiveness: Described as relatively effective, allowing for increase in activities of daily living (ADL) Undesirable effects: Side-effects or Adverse reactions: None reported Monitoring: [place] PMP: Online review of the past 26-monthperiod conducted. Compliant with practice rules and regulations List of all UDS test(s) done:  Lab Results  Component Value Date   TOXASSSELUR FINAL 11/27/2015   [place]AL 06/07/2015   [place]FINAL 03/22/2015   [place]FINAL 12/21/2014   SUMMARY FINAL 05/28/2016   Last UDS on record: [employer] Select 13  Date Value Ref Range Status  11/27/2015 FINAL  Final    Comment:    ==================================================================== TOXASSURE SELECT 13 (MW) ==================================================================== Test                             Result       Flag       Units Drug Present and Declared for Prescription Verification   Desmethyldiazepam              271          EXPECTED   ng/mg creat   Oxazepam                       872          EXPECTED   ng/mg creat   Temazepam                      484          EXPECTED   ng/mg creat    Desmethyldiazepam, oxazepam, and temazepam are expected    metabolites of diazepam. Desmethyldiazepam and oxazepam are also    expected metabolites of other drugs, including chlordiazepoxide,    prazepam, clorazepate, and halazepam. Oxazepam is an expected    metabolite of temazepam. Oxazepam and temazepam are also    available as scheduled prescription medications.   Oxycodone                      1232         EXPECTED   ng/mg  creat   Oxymorphone                    2523         EXPECTED   ng/mg creat   Noroxycodone                   1120         EXPECTED   ng/mg creat   Noroxymorphone                 961          EXPECTED   ng/mg creat    Sources of oxycodone are scheduled prescription medications.    Oxymorphone, noroxycodone, and noroxymorphone are expected    metabolites of oxycodone. Oxymorphone is also available as a    scheduled prescription medication. Drug Absent but Declared for Prescription Verification   Alprazolam                     Not Detected UNEXPECTED ng/mg creat ==================================================================== Test  Result    Flag   Units      Ref Range   Creatinine              120              mg/dL      >=20 ==================================================================== Declared Medications:  The flagging and interpretation on this report are based on the  following declared medications.  Unexpected results may arise from  inaccuracies in the declared medications.  **Note: The testing scope of this panel includes these medications:  Alprazolam (Xanax)  Diazepam (Valium)  Oxycodone  **Note: The testing scope of this panel does not include following  reported medications:  Aspirin  Cetirizine (Zyrtec)  Cyclobenzaprine (Flexeril)  Diltiazem (Cardizem CD)  Enalapril (Vasotec)  Ezetimibe (Zetia)  Gabapentin  Hydrochlorothiazide (Maxzide)  Insulin (Humalog)  Magnesium Oxide  Meclizine (Antivert)  Metformin  Metoprolol (Toprol)  Montelukast (Singulair)  Naloxone (Narcan)  Nortriptyline (Pamelor)  Omega-3 Fatty Acids (Fish Oil)  Omeprazole (Nexium)  Prasugrel (Effient)  Ranitidine (Zantac)  Rosuvastatin (Crestor)  Triamterene (Maxzide) ==================================================================== For clinical consultation, please call [phone]. ====================================================================     Summary  Date Value Ref Range Status  05/28/2016 FINAL  Final    Comment:    ==================================================================== TOXASSURE SELECT 13 (MW) ==================================================================== Test                             Result       Flag       Units Drug Present and Declared for Prescription Verification   Desmethyldiazepam              274          EXPECTED   ng/mg creat   Oxazepam                       826          EXPECTED   ng/mg creat   Temazepam                      363          EXPECTED   ng/mg creat    Desmethyldiazepam, oxazepam, and temazepam are expected    metabolites of diazepam. Desmethyldiazepam and oxazepam are also    expected metabolites of other drugs, including chlordiazepoxide,    prazepam, clorazepate, and halazepam. Oxazepam is an expected    metabolite of temazepam. Oxazepam and temazepam are also    available as scheduled prescription medications.   Oxycodone                      2390         EXPECTED   ng/mg creat   Oxymorphone                    3491         EXPECTED   ng/mg creat   Noroxycodone                   1327         EXPECTED   ng/mg creat   Noroxymorphone                 3689         EXPECTED   ng/mg creat    Sources of oxycodone are scheduled prescription medications.    Oxymorphone, noroxycodone,  and noroxymorphone are expected    metabolites of oxycodone. Oxymorphone is also available as a    scheduled prescription medication. Drug Absent but Declared for Prescription Verification   Alprazolam                     Not Detected UNEXPECTED ng/mg creat ==================================================================== Test                      Result    Flag   Units      Ref Range   Creatinine              147              mg/dL      >=20 ==================================================================== Declared Medications:  The flagging and interpretation on this report are based on  the  following declared medications.  Unexpected results may arise from  inaccuracies in the declared medications.  **Note: The testing scope of this panel includes these medications:  Alprazolam (Xanax)  Diazepam (Valium)  Oxycodone  **Note: The testing scope of this panel does not include following  reported medications:  Aspirin  Cetirizine (Zyrtec)  Cyclobenzaprine (Flexeril)  Diltiazem (Cardizem CD)  Enalapril (Vasotec)  Ezetimibe (Zetia)  Gabapentin  Hydrochlorothiazide (Maxzide)  Insulin (Humalog)  Insulin (Lantus)  Magnesium Oxide  Meclizine (Antivert)  Metformin (Glucophage)  Methylprednisolone (Medrol Dose Pack)  Metoprolol (Toprol)  Montelukast (Singulair)  Naloxone (Narcan)  Niacin (Niaspan)  Nortriptyline (Pamelor)  Omega-3 Fatty Acids (Fish Oil)  Omeprazole (Nexium)  Prasugrel (Effient)  Ranitidine (Zantac)  Rosuvastatin (Crestor)  Triamterene (Maxzide) ==================================================================== For clinical consultation, please call [phone]. ====================================================================    UDS interpretation: Compliant          Medication Assessment Form: Reviewed. Patient indicates being compliant with therapy Treatment compliance: Compliant Risk Assessment Profile: Aberrant behavior: See prior evaluations. None observed or detected today Comorbid factors increasing risk of overdose: See prior notes. No additional risks detected today Risk of substance use disorder (SUD): Low Opioid Risk Tool (ORT) Total Score:    Interpretation Table:  Score <3 = Low Risk for SUD  Score between 4-7 = Moderate Risk for SUD  Score >8 = High Risk for Opioid Abuse   Risk Mitigation Strategies:  Patient Counseling: Covered Patient-Prescriber Agreement (PPA): Present and active  Notification to other healthcare providers: Done  Pharmacologic Plan: No change in therapy, at this time  Laboratory Chemistry   Inflammation Markers (CRP: Acute Phase) (ESR: Chronic Phase) Lab Results  Component Value Date   CRP 1.4 (H) 03/22/2015   ESRSEDRATE 9 03/22/2015                 Renal Function Markers Lab Results  Component Value Date   BUN 20 06/16/2015   CREATININE 1.24 06/16/2015   GFRAA >60 06/16/2015   GFRNONAA >60 06/16/2015                 Hepatic Function Markers Lab Results  Component Value Date   AST 27 03/22/2015   ALT 34 03/22/2015   ALBUMIN 4.1 03/22/2015   ALKPHOS 84 03/22/2015                 Electrolytes Lab Results  Component Value Date   NA 139 06/16/2015   K 4.7 06/16/2015   CL 107 06/16/2015   CALCIUM 9.5 06/16/2015   MG 1.7 03/22/2015  Neuropathy Markers No results found for: [id number]               Bone Pathology Markers Lab Results  Component Value Date   ALKPHOS 84 03/22/2015   CALCIUM 9.5 06/16/2015                 Coagulation Parameters Lab Results  Component Value Date   INR 1.0 02/23/2014   LABPROT 12.9 02/23/2014   APTT 25.9 08/18/2013   PLT 180 06/16/2015                 Cardiovascular Markers Lab Results  Component Value Date   HGB 13.1 06/16/2015   HCT 38.5 (L) 06/16/2015                 Note: Lab results reviewed.  Recent Diagnostic Imaging Review  Dg C-arm 1-60 Min-no Report  Result Date: 06/03/2016 Fluoroscopy was utilized by the requesting physician.  No radiographic interpretation.   Note: Imaging results reviewed.          Meds   No outpatient prescriptions have been marked as taking for the 08/26/16 encounter (Appointment) with Vevelyn Francois, NP.    ROS  Constitutional: Denies any fever or chills Gastrointestinal: No reported hemesis, hematochezia, vomiting, or acute GI distress Musculoskeletal: Denies any acute onset joint swelling, redness, loss of ROM, or weakness Neurological: No reported episodes of acute onset apraxia, aphasia, dysarthria, agnosia, amnesia, paralysis, loss of coordination,  or loss of consciousness  Allergies  Mr. [place]ar[place] has No Known Allergies.  PFSH  Drug: Mr. Skelton  reports that he does not use drugs. Alcohol:  reports that he does not drink alcohol. Tobacco:  reports that he has never smoked. He has never used smokeless tobacco. Medical:  has a past medical history of Allergic rhinitis (12/30/2012); Bronchitis; Can't get food down (08/12/2014); Chronic back pain; Concussion (09/2015); COPD (chronic obstructive pulmonary disease) ([place]); Coronary artery disease; DDD (degenerative disc disease), cervical; Diabetes mellitus without complication ([place]); Dysphagia; GERD (gastroesophageal reflux disease); History of Meniere's disease (12/21/2014); History of thoracic spine surgery (S/P T9-10 IVD spacer) (12/21/2014); Hypercholesteremia; Hyperlipidemia; Hypertension; Meniere's disease; Myocardial infarction ([place]); Neuromuscular disorder ([place]); Psychosis; Short-segment Barrett's esophagus; and Sleep apnea. Surgical: Mr. Codispoti  has a past surgical history that includes Cardiovascular stress test; Cardiac catheterization; Coronary angioplasty; Labrinthectomy; mastoid shunt; Appendectomy; Back surgery; Shoulder arthroscopy with subacromial decompression (Left, 04/06/2012); ARTHRODESIS ANTERIOR ANTERIOR CERVICLE SPINE (01/04/2013); Colonoscopy with propofol (N/A, 09/19/2014); Esophagogastroduodenoscopy (N/A, 09/19/2014); Savory dilation (N/A, 09/19/2014); Spinal cord stimulator implant (Right); Cardiac catheterization (N/A, 06/15/2015); and Cardiac catheterization (N/A, 06/15/2015). Family: family history includes Cancer in his sister; Diabetes in his mother; Heart disease in his father, maternal aunt, maternal uncle, and mother.  Constitutional Exam  General appearance: Well nourished, well developed, and well hydrated. In no apparent acute distress There were no vitals filed for this visit. BMI Assessment: Estimated body mass index is 33.23 kg/m as calculated from the following:   Height  as of 06/03/16: 5' 9"  (1.753 m).   Weight as of 06/03/16: 225 lb (102.1 kg).  BMI interpretation table: BMI level Category Range association with higher incidence of chronic pain  <18 kg/m2 Underweight   18.5-24.9 kg/m2 Ideal body weight   25-29.9 kg/m2 Overweight Increased incidence by 20%  30-34.9 kg/m2 Obese (Class I) Increased incidence by 68%  35-39.9 kg/m2 Severe obesity (Class II) Increased incidence by 136%  >40 kg/m2 Extreme obesity (Class III) Increased incidence by 254%   BMI Readings from  Last 4 Encounters:  06/03/16 33.23 kg/m  05/28/16 33.97 kg/m  02/29/16 33.23 kg/m  11/27/15 33.23 kg/m   Wt Readings from Last 4 Encounters:  06/03/16 225 lb (102.1 kg)  05/28/16 230 lb (104.3 kg)  02/29/16 225 lb (102.1 kg)  11/27/15 225 lb (102.1 kg)  Psych/Mental status: Alert, oriented x 3 (person, place, & time)       Eyes: PERLA Respiratory: No evidence of acute respiratory distress  Cervical Spine Exam  Inspection: No masses, redness, or swelling Alignment: Symmetrical Functional ROM: Unrestricted ROM      Stability: No instability detected Muscle strength & Tone: Functionally intact Sensory: Unimpaired Palpation: No palpable anomalies              Upper Extremity (UE) Exam    Side: Right upper extremity  Side: Left upper extremity  Inspection: No masses, redness, swelling, or asymmetry. No contractures  Inspection: No masses, redness, swelling, or asymmetry. No contractures  Functional ROM: Unrestricted ROM          Functional ROM: Unrestricted ROM          Muscle strength & Tone: Functionally intact  Muscle strength & Tone: Functionally intact  Sensory: Unimpaired  Sensory: Unimpaired  Palpation: No palpable anomalies              Palpation: No palpable anomalies              Specialized Test(s): Deferred         Specialized Test(s): Deferred          Thoracic Spine Exam  Inspection: No masses, redness, or swelling Alignment: Symmetrical Functional ROM:  Unrestricted ROM Stability: No instability detected Sensory: Unimpaired Muscle strength & Tone: No palpable anomalies  Lumbar Spine Exam  Inspection: No masses, redness, or swelling Alignment: Symmetrical Functional ROM: Unrestricted ROM      Stability: No instability detected Muscle strength & Tone: Functionally intact Sensory: Unimpaired Palpation: No palpable anomalies       Provocative Tests: Lumbar Hyperextension and rotation test: evaluation deferred today       Patrick's Maneuver: evaluation deferred today                    Gait & Posture Assessment  Ambulation: Unassisted Gait: Relatively normal for age and body habitus Posture: WNL   Lower Extremity Exam    Side: Right lower extremity  Side: Left lower extremity  Inspection: No masses, redness, swelling, or asymmetry. No contractures  Inspection: No masses, redness, swelling, or asymmetry. No contractures  Functional ROM: Unrestricted ROM          Functional ROM: Unrestricted ROM          Muscle strength & Tone: Functionally intact  Muscle strength & Tone: Functionally intact  Sensory: Unimpaired  Sensory: Unimpaired  Palpation: No palpable anomalies  Palpation: No palpable anomalies   Assessment  Primary Diagnosis & Pertinent Problem List: There were no encounter diagnoses.  Status Diagnosis  Controlled Controlled Controlled No diagnosis found.  Problems updated and reviewed during this visit: No problems updated. Plan of Care  Pharmacotherapy (Medications Ordered): No orders of the defined types were placed in this encounter.  New Prescriptions   No medications on file   Medications administered today: Mr. Musick had no medications administered during this visit. Lab-work, procedure(s), and/or referral(s): No orders of the defined types were placed in this encounter.  Imaging and/or referral(s): None  Interventional therapies: Planned, scheduled, and/or pending:   Not at  this time.   Considering:    ***   Palliative PRN treatment(s):   ***   Provider-requested follow-up: No Follow-up on file.  Future Appointments Date Time Provider [place]  08/26/2016 9:00 AM Vevelyn Francois, NP [facility] None   Primary Care Physician: Madelyn Brunner, MD Location: [facility] Out[facility] Pain Management Facility Note by: Vevelyn Francois NP Date: 08/26/2016; Time: 8:36 AM  Pain Score Disclaimer: We use the NRS-11 scale. This is a self-reported, subjective measurement of pain severity with only modest accuracy. It is used primarily to identify changes within a particular patient. It must be understood that outpatient pain scales are significantly less accurate that those used for research, where they can be applied under ideal controlled circumstances with minimal exposure to variables. In reality, the score is likely to be a combination of pain intensity and pain affect, where pain affect describes the degree of emotional arousal or changes in action readiness caused by the sensory experience of pain. Factors such as social and work situation, setting, emotional state, anxiety levels, expectation, and prior pain experience may influence pain perception and show large inter-individual differences that may also be affected by time variables.  Patient instructions provided during this appointment: There are no Patient Instructions on file for this visit.

## 2016-09-11 DIAGNOSIS — R251 Tremor, unspecified: Secondary | ICD-10-CM | POA: Insufficient documentation

## 2016-11-12 DIAGNOSIS — H8103 Meniere's disease, bilateral: Secondary | ICD-10-CM | POA: Insufficient documentation

## 2016-11-12 DIAGNOSIS — R51 Headache: Secondary | ICD-10-CM

## 2016-11-12 DIAGNOSIS — R519 Headache, unspecified: Secondary | ICD-10-CM | POA: Insufficient documentation

## 2016-11-12 DIAGNOSIS — R200 Anesthesia of skin: Secondary | ICD-10-CM | POA: Insufficient documentation

## 2016-11-12 DIAGNOSIS — R202 Paresthesia of skin: Secondary | ICD-10-CM

## 2016-11-12 DIAGNOSIS — E1140 Type 2 diabetes mellitus with diabetic neuropathy, unspecified: Secondary | ICD-10-CM | POA: Insufficient documentation

## 2016-11-12 DIAGNOSIS — E1142 Type 2 diabetes mellitus with diabetic polyneuropathy: Secondary | ICD-10-CM | POA: Insufficient documentation

## 2016-11-12 DIAGNOSIS — R000 Tachycardia, unspecified: Secondary | ICD-10-CM | POA: Insufficient documentation

## 2016-11-12 DIAGNOSIS — Z794 Long term (current) use of insulin: Secondary | ICD-10-CM

## 2016-11-26 ENCOUNTER — Ambulatory Visit: Payer: Medicare Other | Attending: Nurse Practitioner | Admitting: Nurse Practitioner

## 2016-11-26 ENCOUNTER — Encounter: Payer: Self-pay | Admitting: Nurse Practitioner

## 2016-11-26 VITALS — BP 142/79 | HR 95 | Temp 98.1°F | Resp 16 | Ht 69.0 in | Wt 230.0 lb

## 2016-11-26 DIAGNOSIS — Z794 Long term (current) use of insulin: Secondary | ICD-10-CM | POA: Insufficient documentation

## 2016-11-26 DIAGNOSIS — G4730 Sleep apnea, unspecified: Secondary | ICD-10-CM | POA: Diagnosis not present

## 2016-11-26 DIAGNOSIS — I25110 Atherosclerotic heart disease of native coronary artery with unstable angina pectoris: Secondary | ICD-10-CM | POA: Insufficient documentation

## 2016-11-26 DIAGNOSIS — G894 Chronic pain syndrome: Secondary | ICD-10-CM | POA: Diagnosis not present

## 2016-11-26 DIAGNOSIS — I471 Supraventricular tachycardia: Secondary | ICD-10-CM | POA: Insufficient documentation

## 2016-11-26 DIAGNOSIS — Z79899 Other long term (current) drug therapy: Secondary | ICD-10-CM | POA: Insufficient documentation

## 2016-11-26 DIAGNOSIS — E1142 Type 2 diabetes mellitus with diabetic polyneuropathy: Secondary | ICD-10-CM | POA: Insufficient documentation

## 2016-11-26 DIAGNOSIS — E1122 Type 2 diabetes mellitus with diabetic chronic kidney disease: Secondary | ICD-10-CM | POA: Diagnosis not present

## 2016-11-26 DIAGNOSIS — I252 Old myocardial infarction: Secondary | ICD-10-CM | POA: Diagnosis not present

## 2016-11-26 DIAGNOSIS — M7918 Myalgia, other site: Secondary | ICD-10-CM

## 2016-11-26 DIAGNOSIS — M5442 Lumbago with sciatica, left side: Secondary | ICD-10-CM

## 2016-11-26 DIAGNOSIS — N183 Chronic kidney disease, stage 3 (moderate): Secondary | ICD-10-CM | POA: Diagnosis not present

## 2016-11-26 DIAGNOSIS — Z5181 Encounter for therapeutic drug level monitoring: Secondary | ICD-10-CM | POA: Insufficient documentation

## 2016-11-26 DIAGNOSIS — M792 Neuralgia and neuritis, unspecified: Secondary | ICD-10-CM

## 2016-11-26 DIAGNOSIS — K2270 Barrett's esophagus without dysplasia: Secondary | ICD-10-CM | POA: Insufficient documentation

## 2016-11-26 DIAGNOSIS — M549 Dorsalgia, unspecified: Secondary | ICD-10-CM

## 2016-11-26 DIAGNOSIS — M5412 Radiculopathy, cervical region: Secondary | ICD-10-CM

## 2016-11-26 DIAGNOSIS — I129 Hypertensive chronic kidney disease with stage 1 through stage 4 chronic kidney disease, or unspecified chronic kidney disease: Secondary | ICD-10-CM | POA: Insufficient documentation

## 2016-11-26 DIAGNOSIS — Z7982 Long term (current) use of aspirin: Secondary | ICD-10-CM | POA: Insufficient documentation

## 2016-11-26 DIAGNOSIS — I6523 Occlusion and stenosis of bilateral carotid arteries: Secondary | ICD-10-CM | POA: Insufficient documentation

## 2016-11-26 DIAGNOSIS — G8929 Other chronic pain: Secondary | ICD-10-CM

## 2016-11-26 DIAGNOSIS — Z79891 Long term (current) use of opiate analgesic: Secondary | ICD-10-CM

## 2016-11-26 DIAGNOSIS — M5441 Lumbago with sciatica, right side: Secondary | ICD-10-CM | POA: Diagnosis not present

## 2016-11-26 DIAGNOSIS — E7801 Familial hypercholesterolemia: Secondary | ICD-10-CM | POA: Diagnosis not present

## 2016-11-26 MED ORDER — OXYCODONE HCL 10 MG PO TABS: 10.0000 mg | ORAL_TABLET | Freq: Four times a day (QID) | ORAL | 0 refills | Status: DC | PRN

## 2016-11-26 MED ORDER — GABAPENTIN 800 MG PO TABS: 1600.0000 mg | ORAL_TABLET | Freq: Two times a day (BID) | ORAL | 0 refills | Status: DC

## 2016-11-26 MED ORDER — CYCLOBENZAPRINE HCL 10 MG PO TABS: 10.0000 mg | ORAL_TABLET | Freq: Three times a day (TID) | ORAL | 0 refills | Status: DC | PRN

## 2016-11-26 NOTE — Progress Notes (Signed)
Nursing Pain Medication Assessment:  Safety precautions to be maintained throughout the outpatient stay will include: orient to surroundings, keep bed in low position, maintain call bell within reach at all times, provide assistance with transfer out of bed and ambulation.  Medication Inspection Compliance: Pill count conducted under aseptic conditions, in front of the patient. Neither the pills nor the bottle was removed from the patient's sight at any time. Once count was completed pills were immediately returned to the patient in their original bottle.  Medication: See above Pill/Patch Count: 119 of 120 pills remain Pill/Patch Appearance: Markings consistent with prescribed medication Bottle Appearance: Standard pharmacy container. Clearly labeled. Filled Date: 11 / 05 / 2018 Last Medication intake:  Today

## 2016-11-26 NOTE — Patient Instructions (Addendum)
____________________________________________________________________________________________  Medication Rules  Applies to: All patients receiving prescriptions (written or electronic).  Pharmacy of record: Pharmacy where electronic prescriptions will be sent. If written prescriptions are taken to a different pharmacy, please inform the nursing staff. The pharmacy listed in the electronic medical record should be the one where you would like electronic prescriptions to be sent.  Prescription refills: Only during scheduled appointments. Applies to both, written and electronic prescriptions.  NOTE: The following applies primarily to controlled substances (Opioid* Pain Medications).   Patient's responsibilities: 1. Pain Pills: Bring all pain pills to every appointment (except for procedure appointments). 2. Pill Bottles: Bring pills in original pharmacy bottle. Always bring newest bottle. Bring bottle, even if empty. 3. Medication refills: You are responsible for knowing and keeping track of what medications you need refilled. The day before your appointment, write a list of all prescriptions that need to be refilled. Bring that list to your appointment and give it to the admitting nurse. Prescriptions will be written only during appointments. If you forget a medication, it will not be "Called in", "Faxed", or "electronically sent". You will need to get another appointment to get these prescribed. 4. Prescription Accuracy: You are responsible for carefully inspecting your prescriptions before leaving our office. Have the discharge nurse carefully go over each prescription with you, before taking them home. Make sure that your name is accurately spelled, that your address is correct. Check the name and dose of your medication to make sure it is accurate. Check the number of pills, and the written instructions to make sure they are clear and accurate. Make sure that you are given enough medication to  last until your next medication refill appointment. 5. Taking Medication: Take medication as prescribed. Never take more pills than instructed. Never take medication more frequently than prescribed. Taking less pills or less frequently is permitted and encouraged, when it comes to controlled substances (written prescriptions).  6. Inform other Doctors: Always inform, all of your healthcare providers, of all the medications you take. 7. Pain Medication from other Providers: You are not allowed to accept any additional pain medication from any other Doctor or Healthcare provider. There are two exceptions to this rule. (see below) In the event that you require additional pain medication, you are responsible for notifying us, as stated below. 8. Medication Agreement: You are responsible for carefully reading and following our Medication Agreement. This must be signed before receiving any prescriptions from our practice. Safely store a copy of your signed Agreement. Violations to the Agreement will result in no further prescriptions. (Additional copies of our Medication Agreement are available upon request.) 9. Laws, Rules, & Regulations: All patients are expected to follow all Federal and [employer], [employer], Rules, [employer] [facility] [employer]. Ignorance of the Laws does not constitute a valid excuse. The use of any illegal substances is prohibited. 10. Adopted CDC guidelines & recommendations: Target dosing levels will be at or below 60 MME/day. Use of benzodiazepines** is not recommended.  Exceptions: There are only two exceptions to the rule of not receiving pain medications from other Healthcare Providers. 1. Exception #1 (Emergencies): In the event of an emergency (i.e.: accident requiring emergency care), you are allowed to receive additional pain medication. However, you are responsible for: As soon as you are able, call our office [phone], at any time of the day or night, and leave a message stating your  name, the date and nature of the emergency, and the name and dose of the medication  prescribed. In the event that your call is answered by a member of our staff, make sure to document and save the date, time, and the name of the person that took your information.  2. Exception #2 (Planned Surgery): In the event that you are scheduled by another doctor or dentist to have any type of surgery or procedure, you are allowed (for a period no longer than 30 days), to receive additional pain medication, for the acute post-op pain. However, in this case, you are responsible for picking up a copy of our "Post-op Pain Management for Surgeons" handout, and giving it to your surgeon or dentist. This document is available at our office, and does not require an appointment to obtain it. Simply go to our office during business hours (Monday-Thursday from 8:00 AM to 4:00 PM) (Friday 8:00 AM to 12:00 Noon) or if you have a scheduled appointment with [name], prior to your surgery, and ask for it by name. In addition, you will need to provide [name] with your name, name of your surgeon, type of surgery, and date of procedure or surgery.  *Opioid medications include: morphine, codeine, oxycodone, oxymorphone, hydrocodone, hydromorphone, meperidine, tramadol, tapentadol, buprenorphine, fentanyl, methadone. **Benzodiazepine medications include: diazepam (Valium), alprazolam (Xanax), clonazepam (Klonopine), lorazepam (Ativan), clorazepate (Tranxene), chlordiazepoxide (Librium), estazolam (Prosom), oxazepam (Serax), temazepam (Restoril), triazolam (Halcion)  ____________________________________________________________________________________________ BMI Assessment: Estimated body mass index is 33.97 kg/m as calculated from the following:   Height as of this encounter: 5' 9"  (1.753 m).   Weight as of this encounter: 230 lb (104.3 kg).  BMI interpretation table: BMI level Category Range association with higher incidence of chronic pain   <18 kg/m2 Underweight   18.5-24.9 kg/m2 Ideal body weight   25-29.9 kg/m2 Overweight Increased incidence by 20%  30-34.9 kg/m2 Obese (Class I) Increased incidence by 68%  35-39.9 kg/m2 Severe obesity (Class II) Increased incidence by 136%  >40 kg/m2 Extreme obesity (Class III) Increased incidence by 254%   BMI Readings from Last 4 Encounters:  11/26/16 33.97 kg/m  08/26/16 33.97 kg/m  06/03/16 33.23 kg/m  05/28/16 33.97 kg/m   Wt Readings from Last 4 Encounters:  11/26/16 230 lb (104.3 kg)  08/26/16 230 lb (104.3 kg)  06/03/16 225 lb (102.1 kg)  05/28/16 230 lb (104.3 kg)  GENERAL RISKS AND COMPLICATIONS  What are the risk, side effects and possible complications? Generally speaking, most procedures are safe.  However, with any procedure there are risks, side effects, and the possibility of complications.  The risks and complications are dependent upon the sites that are lesioned, or the type of nerve block to be performed.  The closer the procedure is to the spine, the more serious the risks are.  Great care is taken when placing the radio frequency needles, block needles or lesioning probes, but sometimes complications can occur. 1. Infection: Any time there is an injection through the skin, there is a risk of infection.  This is why sterile conditions are used for these blocks.  There are four possible types of infection. 1. Localized skin infection. 2. Central Nervous System Infection-This can be in the form of Meningitis, which can be deadly. 3. Epidural Infections-This can be in the form of an epidural abscess, which can cause pressure inside of the spine, causing compression of the spinal cord with subsequent paralysis. This would require an emergency surgery to decompress, and there are no guarantees that the patient would recover from the paralysis. 4. Discitis-This is an infection of the intervertebral discs.  It occurs in about 1% of discography procedures.  It is difficult  to treat and it may lead to surgery.        2. Pain: the needles have to go through skin and soft tissues, will cause soreness.       3. Damage to internal structures:  The nerves to be lesioned may be near blood vessels or    other nerves which can be potentially damaged.       4. Bleeding: Bleeding is more common if the patient is taking blood thinners such as  aspirin, Coumadin, Ticiid, Plavix, etc., or if he/she have some genetic predisposition  such as hemophilia. Bleeding into the spinal canal can cause compression of the spinal  cord with subsequent paralysis.  This would require an emergency surgery to  decompress and there are no guarantees that the patient would recover from the  paralysis.       5. Pneumothorax:  Puncturing of a lung is a possibility, every time a needle is introduced in  the area of the chest or upper back.  Pneumothorax refers to free air around the  collapsed lung(s), inside of the thoracic cavity (chest cavity).  Another two possible  complications related to a similar event would include: Hemothorax and Chylothorax.   These are variations of the Pneumothorax, where instead of air around the collapsed  lung(s), you may have blood or chyle, respectively.       6. Spinal headaches: They may occur with any procedures in the area of the spine.       7. Persistent CSF (Cerebro-Spinal Fluid) leakage: This is a rare problem, but may occur  with prolonged intrathecal or epidural catheters either due to the formation of a fistulous  track or a dural tear.       8. Nerve damage: By working so close to the spinal cord, there is always a possibility of  nerve damage, which could be as serious as a permanent spinal cord injury with  paralysis.       9. Death:  Although rare, severe deadly allergic reactions known as "Anaphylactic  reaction" can occur to any of the medications used.      10. Worsening of the symptoms:  We can always make thing worse.  What are the chances of something  like this happening? Chances of any of this occuring are extremely low.  By statistics, you have more of a chance of getting killed in a motor vehicle accident: while driving to the hospital than any of the above occurring .  Nevertheless, you should be aware that they are possibilities.  In general, it is similar to taking a shower.  Everybody knows that you can slip, hit your head and get killed.  Does that mean that you should not shower again?  Nevertheless always keep in mind that statistics do not mean anything if you happen to be on the wrong side of them.  Even if a procedure has a 1 (one) in a 1,000,000 (million) chance of going wrong, it you happen to be that one..Also, keep in mind that by statistics, you have more of a chance of having something go wrong when taking medications.  Who should not have this procedure? If you are on a blood thinning medication (e.g. Coumadin, Plavix, see list of "Blood Thinners"), or if you have an active infection going on, you should not have the procedure.  If you are taking any blood thinners, please inform your physician.  How should I prepare for this procedure?  Do not eat or drink anything at least six hours prior to the procedure.  Bring a driver with you .  It cannot be a taxi.  Come accompanied by an adult that can drive you back, and that is strong enough to help you if your legs get weak or numb from the local anesthetic.  Take all of your medicines the morning of the procedure with just enough water to swallow them.  If you have diabetes, make sure that you are scheduled to have your procedure done first thing in the morning, whenever possible.  If you have diabetes, take only half of your insulin dose and notify our nurse that you have done so as soon as you arrive at the clinic.  If you are diabetic, but only take blood sugar pills (oral hypoglycemic), then do not take them on the morning of your procedure.  You may take them after you  have had the procedure.  Do not take aspirin or any aspirin-containing medications, at least eleven (11) days prior to the procedure.  They may prolong bleeding.  Wear loose fitting clothing that may be easy to take off and that you would not mind if it got stained with Betadine or blood.  Do not wear any jewelry or perfume  Remove any nail coloring.  It will interfere with some of our monitoring equipment.  NOTE: Remember that this is not meant to be interpreted as a complete list of all possible complications.  Unforeseen problems may occur.  BLOOD THINNERS The following drugs contain aspirin or other products, which can cause increased bleeding during surgery and should not be taken for 2 weeks prior to and 1 week after surgery.  If you should need take something for relief of minor pain, you may take acetaminophen which is found in Tylenol,m Datril, Anacin-3 and Panadol. It is not blood thinner. The products listed below are.  Do not take any of the products listed below in addition to any listed on your instruction sheet.  A.P.C or A.P.C with Codeine Codeine Phosphate Capsules #3 Ibuprofen Ridaura  ABC compound Congesprin Imuran rimadil  Advil Cope Indocin Robaxisal  Alka-Seltzer Effervescent Pain Reliever and Antacid Coricidin or Coricidin-D  Indomethacin Rufen  Alka-Seltzer plus Cold Medicine Cosprin Ketoprofen S-A-C Tablets  Anacin Analgesic Tablets or Capsules Coumadin Korlgesic Salflex  Anacin Extra Strength Analgesic tablets or capsules CP-2 Tablets Lanoril Salicylate  Anaprox Cuprimine Capsules Levenox Salocol  Anexsia-D Dalteparin Magan Salsalate  Anodynos Darvon compound Magnesium Salicylate Sine-off  Ansaid Dasin Capsules Magsal Sodium Salicylate  Anturane Depen Capsules Marnal Soma  APF Arthritis pain formula Dewitt's Pills Measurin Stanback  Argesic Dia-Gesic Meclofenamic Sulfinpyrazone  Arthritis Bayer Timed Release Aspirin Diclofenac Meclomen Sulindac  Arthritis pain  formula Anacin Dicumarol Medipren Supac  Analgesic (Safety coated) Arthralgen Diffunasal Mefanamic Suprofen  Arthritis Strength Bufferin Dihydrocodeine Mepro Compound Suprol  Arthropan liquid Dopirydamole Methcarbomol with Aspirin Synalgos  ASA tablets/Enseals Disalcid Micrainin Tagament  Ascriptin Doan's Midol Talwin  Ascriptin A/D Dolene Mobidin Tanderil  Ascriptin Extra Strength Dolobid Moblgesic Ticlid  Ascriptin with Codeine Doloprin or Doloprin with Codeine Momentum Tolectin  Asperbuf Duoprin Mono-gesic Trendar  Aspergum Duradyne Motrin or Motrin IB Triminicin  Aspirin plain, buffered or enteric coated Durasal Myochrisine Trigesic  Aspirin Suppositories Easprin Nalfon Trillsate  Aspirin with Codeine Ecotrin Regular or Extra Strength Naprosyn Uracel  Atromid-S Efficin Naproxen Ursinus  Auranofin Capsules Elmiron Neocylate Vanquish  Axotal Emagrin Norgesic Verin  Azathioprine  Empirin or Empirin with Codeine Normiflo Vitamin E  Azolid Emprazil Nuprin Voltaren  Bayer Aspirin plain, buffered or children's or timed BC Tablets or powders Encaprin Orgaran Warfarin Sodium  Buff-a-Comp Enoxaparin Orudis Zorpin  Buff-a-Comp with Codeine Equegesic Os-Cal-Gesic   Buffaprin Excedrin plain, buffered or Extra Strength Oxalid   Bufferin Arthritis Strength Feldene Oxphenbutazone   Bufferin plain or Extra Strength Feldene Capsules Oxycodone with Aspirin   Bufferin with Codeine Fenoprofen Fenoprofen Pabalate or Pabalate-SF   Buffets II Flogesic Panagesic   Buffinol plain or Extra Strength Florinal or Florinal with Codeine Panwarfarin   Buf-Tabs Flurbiprofen Penicillamine   Butalbital Compound Four-way cold tablets Penicillin   Butazolidin Fragmin Pepto-Bismol   Carbenicillin Geminisyn Percodan   Carna Arthritis Reliever Geopen Persantine   Carprofen Gold's salt Persistin   Chloramphenicol Goody's Phenylbutazone   Chloromycetin Haltrain Piroxlcam   Clmetidine heparin Plaquenil   Cllnoril  Hyco-pap Ponstel   Clofibrate Hydroxy chloroquine Propoxyphen         Before stopping any of these medications, be sure to consult the physician who ordered them.  Some, such as Coumadin (Warfarin) are ordered to prevent or treat serious conditions such as "deep thrombosis", "pumonary embolisms", and other heart problems.  The amount of time that you may need off of the medication may also vary with the medication and the reason for which you were taking it.  If you are taking any of these medications, please make sure you notify your pain physician before you undergo any procedures.

## 2016-11-26 NOTE — Progress Notes (Signed)
Patient's Name: Kenneth Osborn  MRN: [id number]  Referring Provider: Madelyn Brunner, MD  DOB: Jun 17, 1955  PCP: Madelyn Brunner, MD  DOS: 11/26/2016  Note by: Vevelyn Francois NP  Service setting: Ambulatory outpatient  Specialty: Interventional Pain Management  Location: [facility] (AMB) Pain Management Facility    Patient type: Established    Primary Reason(s) for Visit: Encounter for prescription drug management. (Level of risk: moderate)  CC: Back Pain (mid)  HPI  Kenneth Osborn is a 61 y.o. year old, male patient, who comes today for a medication management evaluation. 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 Beverely Pace at [facility] on 01/04/2013); Long term current use of opiate analgesic; Long term prescription opiate use; Opiate use (60 MME/Day); Opiate dependence ([place]); Encounter for therapeutic drug level monitoring; Neurogenic pain; Neuropathic pain; Thoracic facet syndrome (T8-10); Lumbar facet syndrome (Bilateral); Cervical facet syndrome (right side); Cervical spondylosis; Lumbar spondylosis; Chronic pain of left upper extremity; Chronic cervical radicular pain (left side); Chronic upper back pain; History of thoracic spine surgery (S/P T9-10 IVD spacer); Failed back surgical syndrome; Musculoskeletal pain; Myofascial pain; Chronic left lower extremity pain; Chronic radicular lumbar pain (left L4 dermatomal pain); Coronary artery disease; History of MI (myocardial infarction) (May 2012);  Chronic anticoagulation (on [place]); History of Meniere's disease; Sleep apnea; Type 2 diabetes mellitus with hyperglycemia ([place]); Dysphagia; Encounter for interrogation of neurostimulator; [employer] spinal cord stimulator (implant date: 11/01/2014); Unstable angina ([place]); Controlled type 2 diabetes mellitus without complication ([place]); Arthralgia of shoulder; CAD (coronary artery disease); Paroxysmal supraventricular tachycardia ([place]); Balance problem; Chronic tension-type headache, not intractable; Dizziness; Post-concussion headache; Mild cognitive impairment; 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 His primarily concern today is the Back Pain (mid)  Pain Assessment: Location: Mid Back Radiating: right buttocks down back of leg to ankle, also radiates up back Onset: More than a month ago Duration: Chronic pain Quality: Aching, Constant, Spasm, Discomfort, Nagging Severity: 8 /10 (self-reported pain score)  Note: Reported level is compatible with observation.                    Effect on ADL: bending head, twisting, standing long periods of time Timing: Constant Modifying factors: medications, procedures  Kenneth Osborn was last scheduled for an appointment on 08/26/2016 for medication management. During today's appointment we reviewed Kenneth Osborn's chronic pain status, as well as his outpatient medication regimen. He admits that the pain around his SCS is causing him more pain. He did have injection which was effective. He admits that he spasms are getting worse but he does not feel like it is quite time to repeat. He denies any constipation or over sedation.    The patient  reports that he does not use  drugs. His body mass index is 33.97 kg/m.  Further details on both, my assessment(s), as well  as the proposed treatment plan, please see below.  Controlled Substance Pharmacotherapy Assessment REMS (Risk Evaluation and Mitigation Strategy)  Analgesic:Oxycodone IR 10 mg every 6 hours (40 mg/day) MME/day:60 mg/day Ignatius Specking, RN  11/26/2016  9:57 AM  Sign at close encounter Nursing Pain Medication Assessment:  Safety precautions to be maintained throughout the outpatient stay will include: orient to surroundings, keep bed in low position, maintain call bell within reach at all times, provide assistance with transfer out of bed and ambulation.  Medication Inspection Compliance: Pill count conducted under aseptic conditions, in front of the patient. Neither the pills nor the bottle was removed from the patient's sight at any time. Once count was completed pills were immediately returned to the patient in their original bottle.  Medication: See above Pill/Patch Count: 119 of 120 pills remain Pill/Patch Appearance: Markings consistent with prescribed medication Bottle Appearance: Standard pharmacy container. Clearly labeled. Filled Date: 36 / 05 / 2018 Last Medication intake:  Today   Pharmacokinetics: Liberation and absorption (onset of action): WNL Distribution (time to peak effect): WNL Metabolism and excretion (duration of action): WNL         Pharmacodynamics: Desired effects: Analgesia: Kenneth Osborn reports >50% benefit. Functional ability: Patient reports that medication allows him to accomplish basic ADLs Clinically meaningful improvement in function (CMIF): Sustained CMIF goals met Perceived effectiveness: Described as relatively effective, allowing for increase in activities of daily living (ADL) Undesirable effects: Side-effects or Adverse reactions: None reported Monitoring: [place] PMP: Online review of the past 4-monthperiod conducted. Compliant with practice rules and  regulations Last UDS on record: Summary  Date Value Ref Range Status  05/28/2016 FINAL  Final    Comment:    ==================================================================== TOXASSURE SELECT 13 (MW) ==================================================================== Test                             Result       Flag       Units Drug Present and Declared for Prescription Verification   Desmethyldiazepam              274          EXPECTED   ng/mg creat   Oxazepam                       826          EXPECTED   ng/mg creat   Temazepam                      363          EXPECTED   ng/mg creat    Desmethyldiazepam, oxazepam, and temazepam are expected    metabolites of diazepam. Desmethyldiazepam and oxazepam are also    expected metabolites of other drugs, including chlordiazepoxide,    prazepam, clorazepate, and halazepam. Oxazepam is an expected    metabolite of temazepam. Oxazepam and temazepam are also    available as scheduled prescription medications.   Oxycodone                      2390         EXPECTED   ng/mg creat   Oxymorphone                    3491         EXPECTED   ng/mg creat   Noroxycodone  1327         EXPECTED   ng/mg creat   Noroxymorphone                 3689         EXPECTED   ng/mg creat    Sources of oxycodone are scheduled prescription medications.    Oxymorphone, noroxycodone, and noroxymorphone are expected    metabolites of oxycodone. Oxymorphone is also available as a    scheduled prescription medication. Drug Absent but Declared for Prescription Verification   Alprazolam                     Not Detected UNEXPECTED ng/mg creat ==================================================================== Test                      Result    Flag   Units      Ref Range   Creatinine              147              mg/dL      >=20 ==================================================================== Declared Medications:  The flagging and interpretation  on this report are based on the  following declared medications.  Unexpected results may arise from  inaccuracies in the declared medications.  **Note: The testing scope of this panel includes these medications:  Alprazolam (Xanax)  Diazepam (Valium)  Oxycodone  **Note: The testing scope of this panel does not include following  reported medications:  Aspirin  Cetirizine (Zyrtec)  Cyclobenzaprine (Flexeril)  Diltiazem (Cardizem CD)  Enalapril (Vasotec)  Ezetimibe (Zetia)  Gabapentin  Hydrochlorothiazide (Maxzide)  Insulin (Humalog)  Insulin (Lantus)  Magnesium Oxide  Meclizine (Antivert)  Metformin (Glucophage)  Methylprednisolone (Medrol Dose Pack)  Metoprolol (Toprol)  Montelukast (Singulair)  Naloxone (Narcan)  Niacin (Niaspan)  Nortriptyline (Pamelor)  Omega-3 Fatty Acids (Fish Oil)  Omeprazole (Nexium)  Prasugrel (Effient)  Ranitidine (Zantac)  Rosuvastatin (Crestor)  Triamterene (Maxzide) ==================================================================== For clinical consultation, please call [phone]. ====================================================================    UDS interpretation: Compliant          Medication Assessment Form: Reviewed. Patient indicates being compliant with therapy Treatment compliance: Compliant Risk Assessment Profile: Aberrant behavior: See prior evaluations. None observed or detected today Comorbid factors increasing risk of overdose: See prior notes. No additional risks detected today Risk of substance use disorder (SUD): Low     Opioid Risk Tool - 11/26/16 0952      Family History of Substance Abuse   Alcohol Negative   Illegal Drugs Negative   Rx Drugs Negative     Personal History of Substance Abuse   Alcohol Negative   Illegal Drugs Negative   Rx Drugs Negative     Age   Age between 69-45 years  No     History of Preadolescent Sexual Abuse   History of Preadolescent Sexual Abuse Negative or Male      Psychological Disease   Psychological Disease Negative     Total Score   Opioid Risk Tool Scoring 0   Opioid Risk Interpretation Low Risk     ORT Scoring interpretation table:  Score <3 = Low Risk for SUD  Score between 4-7 = Moderate Risk for SUD  Score >8 = High Risk for Opioid Abuse   Risk Mitigation Strategies:  Patient Counseling: Covered Patient-Prescriber Agreement (PPA): Present and active  Notification to other healthcare providers: Done  Pharmacologic Plan: No change in therapy, at this time  Laboratory Chemistry  Inflammation Markers (CRP: Acute Phase) (ESR: Chronic Phase) Lab Results  Component Value Date   CRP 1.4 (H) 03/22/2015   ESRSEDRATE 9 03/22/2015                 Renal Function Markers Lab Results  Component Value Date   BUN 20 06/16/2015   CREATININE 1.24 06/16/2015   GFRAA >60 06/16/2015   GFRNONAA >60 06/16/2015                 Hepatic Function Markers Lab Results  Component Value Date   AST 27 03/22/2015   ALT 34 03/22/2015   ALBUMIN 4.1 03/22/2015   ALKPHOS 84 03/22/2015                 Electrolytes Lab Results  Component Value Date   NA 139 06/16/2015   K 4.7 06/16/2015   CL 107 06/16/2015   CALCIUM 9.5 06/16/2015   MG 1.7 03/22/2015                 Neuropathy Markers No results found for: [id number]               Bone Pathology Markers Lab Results  Component Value Date   ALKPHOS 84 03/22/2015   CALCIUM 9.5 06/16/2015                 Coagulation Parameters Lab Results  Component Value Date   INR 1.0 02/23/2014   LABPROT 12.9 02/23/2014   APTT 25.9 08/18/2013   PLT 180 06/16/2015                 Cardiovascular Markers Lab Results  Component Value Date   HGB 13.1 06/16/2015   HCT 38.5 (L) 06/16/2015                 Note: Lab results reviewed.  Recent Diagnostic Imaging Results  DG C-Arm 1-60 Min-No Report Fluoroscopy was utilized by the requesting physician.  No radiographic  interpretation.   Complexity  Note: Imaging results reviewed. Results shared with Mr. Loy, using Layman's terms.                         Meds   Current Outpatient Prescriptions:  .  ACCU-CHEK AVIVA PLUS test strip, , Disp: , Rfl: 2 .  ALPRAZolam (XANAX) 0.5 MG tablet, Take 0.5 mg by mouth at bedtime as needed (Meniere's disease). , Disp: , Rfl:  .  aspirin EC 81 MG tablet, Take 81 mg by mouth at bedtime. , Disp: , Rfl:  .  cetirizine (ZYRTEC) 10 MG tablet, Take 10 mg by mouth daily., Disp: , Rfl:  .  diazepam (VALIUM) 5 MG tablet, Take 5 mg by mouth 3 (three) times daily as needed (Meniere's disease). , Disp: , Rfl:  .  diltiazem (CARTIA XT) 240 MG 24 hr capsule, Take 240 mg by mouth at bedtime., Disp: , Rfl:  .  enalapril (VASOTEC) 10 MG tablet, Take 10 mg by mouth 2 (two) times daily., Disp: , Rfl:  .  esomeprazole (NEXIUM) 40 MG capsule, Take 40 mg by mouth 2 (two) times daily., Disp: , Rfl:  .  ezetimibe (ZETIA) 10 MG tablet, Take 10 mg by mouth 2 (two) times daily. , Disp: , Rfl:  .  insulin glargine (LANTUS) 100 UNIT/ML injection, Inject 100 Units into the skin at bedtime. , Disp: , Rfl:  .  insulin lispro (HUMALOG KWIKPEN) 100 UNIT/ML KiwkPen, Inject 30 Units into  the skin daily before lunch. , Disp: , Rfl:  .  Insulin Lispro Prot & Lispro (HUMALOG MIX 75/25 KWIKPEN) (75-25) 100 UNIT/ML Kwikpen, Inject 60 Units into the skin 2 (two) times daily with a meal., Disp: , Rfl:  .  Insulin Syringe-Needle U-100 (BD INSULIN SYRINGE ULTRAFINE) 31G X 15/64" 1 ML MISC, , Disp: , Rfl:  .  magnesium oxide (MAG-OX) 400 MG tablet, Take 400 mg by mouth daily. , Disp: , Rfl:  .  meclizine (ANTIVERT) 25 MG tablet, Take 25 mg by mouth 3 (three) times daily as needed for dizziness. , Disp: , Rfl:  .  metFORMIN (GLUCOPHAGE) 1000 MG tablet, Take 1,000 mg by mouth 2 (two) times daily with a meal., Disp: , Rfl:  .  metoprolol succinate (TOPROL-XL) 50 MG 24 hr tablet, Take 50 mg by mouth 2 (two) times daily. Take with or immediately  following a meal. , Disp: , Rfl:  .  montelukast (SINGULAIR) 10 MG tablet, Take by mouth daily. , Disp: , Rfl:  .  naloxone (NARCAN) 2 MG/2ML injection, Inject content of syringe into thigh muscle. Call 911., Disp: 2 Syringe, Rfl: 1 .  niacin (NIASPAN) 500 MG CR tablet, Take 500 mg by mouth at bedtime., Disp: , Rfl:  .  Omega-3 Fatty Acids (FISH OIL) 1000 MG CAPS, Take 1,200 mg by mouth 2 (two) times daily. , Disp: , Rfl:  .  prasugrel (EFFIENT) 10 MG TABS, Take 10 mg by mouth daily., Disp: , Rfl:  .  ranitidine (ZANTAC) 150 MG tablet, Take 150 mg by mouth at bedtime. , Disp: , Rfl:  .  rosuvastatin (CRESTOR) 20 MG tablet, Take 20 mg by mouth at bedtime., Disp: , Rfl:  .  triamterene-hydrochlorothiazide (MAXZIDE) 75-50 MG tablet, Take 1 tablet by mouth daily. , Disp: , Rfl:  .  venlafaxine (EFFEXOR) 75 MG tablet, Take by mouth., Disp: , Rfl:  .  cyclobenzaprine (FLEXERIL) 10 MG tablet, Take 1 tablet (10 mg total) by mouth every 8 (eight) hours as needed for muscle spasms., Disp: 270 tablet, Rfl: 0 .  gabapentin (NEURONTIN) 800 MG tablet, Take 2 tablets (1,600 mg total) by mouth 2 (two) times daily., Disp: 360 tablet, Rfl: 0 .  montelukast (SINGULAIR) 10 MG tablet, Take 10 mg by mouth daily. , Disp: , Rfl:  .  [START ON 12/22/2016] Oxycodone HCl 10 MG TABS, Take 1 tablet (10 mg total) by mouth every 6 (six) hours as needed., Disp: 120 tablet, Rfl: 0 .  [START ON 01/21/2017] Oxycodone HCl 10 MG TABS, Take 1 tablet (10 mg total) by mouth every 6 (six) hours as needed., Disp: 120 tablet, Rfl: 0 .  [START ON 02/20/2017] Oxycodone HCl 10 MG TABS, Take 1 tablet (10 mg total) by mouth every 6 (six) hours as needed., Disp: 120 tablet, Rfl: 0  ROS  Constitutional: Denies any fever or chills Gastrointestinal: No reported hemesis, hematochezia, vomiting, or acute GI distress Musculoskeletal: Denies any acute onset joint swelling, redness, loss of ROM, or weakness Neurological: No reported episodes of acute  onset apraxia, aphasia, dysarthria, agnosia, amnesia, paralysis, loss of coordination, or loss of consciousness  Allergies  Mr. Gee has No Known Allergies.  PFSH  Drug: Mr. Adkison  reports that he does not use drugs. Alcohol:  reports that he does not drink alcohol. Tobacco:  reports that he has never smoked. He has never used smokeless tobacco. Medical:  has a past medical history of Allergic rhinitis (12/30/2012); Bronchitis; Can't get food down (  08/12/2014); Chronic back pain; Concussion (09/2015); COPD (chronic obstructive pulmonary disease) ([place]); Coronary artery disease; DDD (degenerative disc disease), cervical; Diabetes mellitus without complication ([place]); Dysphagia; GERD (gastroesophageal reflux disease); History of Meniere's disease (12/21/2014); History of thoracic spine surgery (S/P T9-10 IVD spacer) (12/21/2014); Hypercholesteremia; Hyperlipidemia; Hypertension; Meniere's disease; Myocardial infarction ([place]); Neuromuscular disorder ([place]); Psychosis ([place]); Short-segment Barrett's esophagus; and Sleep apnea. Surgical: Mr. Fazzino  has a past surgical history that includes Cardiovascular stress test; Cardiac catheterization; Coronary angioplasty; Labrinthectomy; mastoid shunt; Appendectomy; Back surgery; Shoulder arthroscopy with subacromial decompression (Left, 04/06/2012); ARTHRODESIS ANTERIOR ANTERIOR CERVICLE SPINE (01/04/2013); Colonoscopy with propofol (N/A, 09/19/2014); Esophagogastroduodenoscopy (N/A, 09/19/2014); Savory dilation (N/A, 09/19/2014); Spinal cord stimulator implant (Right); Cardiac catheterization (N/A, 06/15/2015); and Cardiac catheterization (N/A, 06/15/2015). Family: family history includes Cancer in his sister; Diabetes in his mother; Heart disease in his father, maternal aunt, maternal uncle, and mother.  Constitutional Exam  General appearance: Well nourished, well developed, and well hydrated. In no apparent acute distress Vitals:   11/26/16 0943  BP: (!) 142/79  Pulse: 95   Resp: 16  Temp: 98.1 F (36.7 C)  SpO2: 99%  Weight: 230 lb (104.3 kg)  Height: 5' 9"  (1.753 m)   BMI Assessment: Estimated body mass index is 33.97 kg/m as calculated from the following:   Height as of this encounter: 5' 9"  (1.753 m).   Weight as of this encounter: 230 lb (104.3 kg). Psych/Mental status: Alert, oriented x 3 (person, place, & time)       Eyes: PERLA Respiratory: No evidence of acute respiratory distress  Cervical Spine Area Exam  Skin & Axial Inspection: No masses, redness, edema, swelling, or associated skin lesions Alignment: Symmetrical Functional ROM: Unrestricted ROM      Stability: No instability detected Muscle Tone/Strength: Functionally intact. No obvious neuro-muscular anomalies detected. Sensory (Neurological): Unimpaired Palpation: No palpable anomalies              Upper Extremity (UE) Exam    Side: Right upper extremity  Side: Left upper extremity  Skin & Extremity Inspection: Skin color, temperature, and hair growth are WNL. No peripheral edema or cyanosis. No masses, redness, swelling, asymmetry, or associated skin lesions. No contractures.  Skin & Extremity Inspection: Skin color, temperature, and hair growth are WNL. No peripheral edema or cyanosis. No masses, redness, swelling, asymmetry, or associated skin lesions. No contractures.  Functional ROM: Unrestricted ROM          Functional ROM: Unrestricted ROM          Muscle Tone/Strength: Functionally intact. No obvious neuro-muscular anomalies detected.  Muscle Tone/Strength: Functionally intact. No obvious neuro-muscular anomalies detected.  Sensory (Neurological): Unimpaired          Sensory (Neurological): Unimpaired          Palpation: No palpable anomalies              Palpation: No palpable anomalies              Specialized Test(s): Deferred         Specialized Test(s): Deferred          Thoracic Spine Area Exam  Skin & Axial Inspection: No masses, redness, or swelling Alignment:  Symmetrical Functional ROM: Unrestricted ROM Stability: No instability detected Muscle Tone/Strength: Functionally intact. No obvious neuro-muscular anomalies detected. Sensory (Neurological): Unimpaired Muscle strength & Tone: No palpable anomalies  Lumbar Spine Area Exam  Skin & Axial Inspection: No masses, redness, or swelling SCS patent Alignment: Symmetrical Functional ROM: Unrestricted ROM  Stability: No instability detected Muscle Tone/Strength: Functionally intact. No obvious neuro-muscular anomalies detected. Sensory (Neurological): Unimpaired Palpation: Complains of area being tender to palpation       Provocative Tests: Lumbar Hyperextension and rotation test: Positive bilaterally for facet joint pain. Lumbar Lateral bending test: evaluation deferred today       Patrick's Maneuver: evaluation deferred today                    Gait & Posture Assessment  Ambulation: Unassisted Gait: Relatively normal for age and body habitus Posture: WNL   Lower Extremity Exam    Side: Right lower extremity  Side: Left lower extremity  Skin & Extremity Inspection: Skin color, temperature, and hair growth are WNL. No peripheral edema or cyanosis. No masses, redness, swelling, asymmetry, or associated skin lesions. No contractures.  Skin & Extremity Inspection: Skin color, temperature, and hair growth are WNL. No peripheral edema or cyanosis. No masses, redness, swelling, asymmetry, or associated skin lesions. No contractures.  Functional ROM: Unrestricted ROM          Functional ROM: Unrestricted ROM          Muscle Tone/Strength: Functionally intact. No obvious neuro-muscular anomalies detected.  Muscle Tone/Strength: Functionally intact. No obvious neuro-muscular anomalies detected.  Sensory (Neurological): Unimpaired  Sensory (Neurological): Unimpaired  Palpation: No palpable anomalies  Palpation: No palpable anomalies   Assessment  Primary Diagnosis & Pertinent Problem List: The  primary encounter diagnosis was Chronic low back pain with bilateral sciatica, unspecified back pain laterality. Diagnoses of Chronic cervical radicular pain (left side), Chronic upper back pain, Neuropathic pain, Chronic pain syndrome, Musculoskeletal pain, and Long term current use of opiate analgesic were also pertinent to this visit.  Status Diagnosis  Persistent Controlled Controlled 1. Chronic low back pain with bilateral sciatica, unspecified back pain laterality   2. Chronic cervical radicular pain (left side)   3. Chronic upper back pain   4. Neuropathic pain   5. Chronic pain syndrome   6. Musculoskeletal pain   7. Long term current use of opiate analgesic     Problems updated and reviewed during this visit: No problems updated. Plan of Care  Pharmacotherapy (Medications Ordered): Meds ordered this encounter  Medications  . Oxycodone HCl 10 MG TABS    Sig: Take 1 tablet (10 mg total) by mouth every 6 (six) hours as needed.    Dispense:  120 tablet    Refill:  0    Do not place this medication, or any other prescription from our practice, on "Automatic Refill". Patient may have prescription filled one day early if pharmacy is closed on scheduled refill date. Do not fill until: 12/22/2016 To last until: 01/21/2017    Order Specific Question:   Supervising Provider    Answer:   Milinda Pointer [phone]  . Oxycodone HCl 10 MG TABS    Sig: Take 1 tablet (10 mg total) by mouth every 6 (six) hours as needed.    Dispense:  120 tablet    Refill:  0    Do not place this medication, or any other prescription from our practice, on "Automatic Refill". Patient may have prescription filled one day early if pharmacy is closed on scheduled refill date. Do not fill until:01/21/2017 To last until: 02/20/2017    Order Specific Question:   Supervising Provider    Answer:   Milinda Pointer [phone]  . Oxycodone HCl 10 MG TABS    Sig: Take 1 tablet (10 mg total)  by mouth every 6 (six) hours  as needed.    Dispense:  120 tablet    Refill:  0    Do not place this medication, or any other prescription from our practice, on "Automatic Refill". Patient may have prescription filled one day early if pharmacy is closed on scheduled refill date. Do not fill until: 02/20/2017 To last until:03/22/2017    Order Specific Question:   Supervising Provider    Answer:   Milinda Pointer [phone]  . gabapentin (NEURONTIN) 800 MG tablet    Sig: Take 2 tablets (1,600 mg total) by mouth 2 (two) times daily.    Dispense:  360 tablet    Refill:  0    Do not place this medication, or any other prescription from our practice, on "Automatic Refill". Patient may have prescription filled one day early if pharmacy is closed on scheduled refill date. (3 mo refill)    Order Specific Question:   Supervising Provider    Answer:   Milinda Pointer [phone]  . cyclobenzaprine (FLEXERIL) 10 MG tablet    Sig: Take 1 tablet (10 mg total) by mouth every 8 (eight) hours as needed for muscle spasms.    Dispense:  270 tablet    Refill:  0    3 Month refill. Do not add this medication to the electronic "Automatic Refill" notification system. Patient may have prescription filled one day early if pharmacy is closed on scheduled refill date.    Order Specific Question:   Supervising Provider    Answer:   Milinda Pointer [[id number]]   New Prescriptions   No medications on file   Medications administered today: Mr. Satz had no medications administered during this visit. Lab-work, procedure(s), and/or referral(s): Orders Placed This Encounter  Procedures  . ToxASSURE Select 13 (MW), Urine   Imaging and/or referral(s): None  Interventional therapies: Planned, scheduled, and/or pending: None at this time. He will call for procedure in the future   Considering: None at this time.   Palliative PRN treatment(s): Right side posterior superior iliac spine Trigger PointInjection   Provider-requested  follow-up: No Follow-up on file.  Future Appointments Date Time Provider [place]  02/26/2017 8:30 AM Vevelyn Francois, NP [facility] None   Primary Care Physician: Madelyn Brunner, MD Location: [facility] Note by: Vevelyn Francois NP Date: 11/26/2016; Time: 11:06 AM  Pain Score Disclaimer: We use the NRS-11 scale. This is a self-reported, subjective measurement of pain severity with only modest accuracy. It is used primarily to identify changes within a particular patient. It must be understood that outpatient pain scales are significantly less accurate that those used for research, where they can be applied under ideal controlled circumstances with minimal exposure to variables. In reality, the score is likely to be a combination of pain intensity and pain affect, where pain affect describes the degree of emotional arousal or changes in action readiness caused by the sensory experience of pain. Factors such as social and work situation, setting, emotional state, anxiety levels, expectation, and prior pain experience may influence pain perception and show large inter-individual differences that may also be affected by time variables.  Patient instructions provided during this appointment: Patient Instructions    ____________________________________________________________________________________________  Medication Rules  Applies to: All patients receiving prescriptions (written or electronic).  Pharmacy of record: Pharmacy where electronic prescriptions will be sent. If written prescriptions are taken to a different pharmacy, please inform the nursing staff. The pharmacy listed in the  electronic medical record should be the one where you would like electronic prescriptions to be sent.  Prescription refills: Only during scheduled appointments. Applies to both, written and electronic prescriptions.  NOTE: The following applies primarily to controlled  substances (Opioid* Pain Medications).   Patient's responsibilities: 1. Pain Pills: Bring all pain pills to every appointment (except for procedure appointments). 2. Pill Bottles: Bring pills in original pharmacy bottle. Always bring newest bottle. Bring bottle, even if empty. 3. Medication refills: You are responsible for knowing and keeping track of what medications you need refilled. The day before your appointment, write a list of all prescriptions that need to be refilled. Bring that list to your appointment and give it to the admitting nurse. Prescriptions will be written only during appointments. If you forget a medication, it will not be "Called in", "Faxed", or "electronically sent". You will need to get another appointment to get these prescribed. 4. Prescription Accuracy: You are responsible for carefully inspecting your prescriptions before leaving our office. Have the discharge nurse carefully go over each prescription with you, before taking them home. Make sure that your name is accurately spelled, that your address is correct. Check the name and dose of your medication to make sure it is accurate. Check the number of pills, and the written instructions to make sure they are clear and accurate. Make sure that you are given enough medication to last until your next medication refill appointment. 5. Taking Medication: Take medication as prescribed. Never take more pills than instructed. Never take medication more frequently than prescribed. Taking less pills or less frequently is permitted and encouraged, when it comes to controlled substances (written prescriptions).  6. Inform other Doctors: Always inform, all of your healthcare providers, of all the medications you take. 7. Pain Medication from other Providers: You are not allowed to accept any additional pain medication from any other Doctor or Healthcare provider. There are two exceptions to this rule. (see below) In the event that you  require additional pain medication, you are responsible for notifying us, as stated below. 8. Medication Agreement: You are responsible for carefully reading and following our Medication Agreement. This must be signed before receiving any prescriptions from our practice. Safely store a copy of your signed Agreement. Violations to the Agreement will result in no further prescriptions. (Additional copies of our Medication Agreement are available upon request.) 9. Laws, Rules, & Regulations: All patients are expected to follow all Federal and [employer], [employer], Rules, [employer]. Ignorance of the Laws does not constitute a valid excuse. The use of any illegal substances is prohibited. 10. Adopted CDC guidelines & recommendations: Target dosing levels will be at or below 60 MME/day. Use of benzodiazepines** is not recommended.  Exceptions: There are only two exceptions to the rule of not receiving pain medications from other Healthcare Providers. 1. Exception #1 (Emergencies): In the event of an emergency (i.e.: accident requiring emergency care), you are allowed to receive additional pain medication. However, you are responsible for: As soon as you are able, call our office [phone], at any time of the day or night, and leave a message stating your name, the date and nature of the emergency, and the name and dose of the medication prescribed. In the event that your call is answered by a member of our staff, make sure to document and save the date, time, and the name of the person that took your information.  2. Exception #2 (Planned Surgery): In the event that  you are scheduled by another doctor or dentist to have any type of surgery or procedure, you are allowed (for a period no longer than 30 days), to receive additional pain medication, for the acute post-op pain. However, in this case, you are responsible for picking up a copy of our "Post-op Pain Management for Surgeons" handout, and giving it  to your surgeon or dentist. This document is available at our office, and does not require an appointment to obtain it. Simply go to our office during business hours (Monday-Thursday from 8:00 AM to 4:00 PM) (Friday 8:00 AM to 12:00 Noon) or if you have a scheduled appointment with [name], prior to your surgery, and ask for it by name. In addition, you will need to provide [name] with your name, name of your surgeon, type of surgery, and date of procedure or surgery.  *Opioid medications include: morphine, codeine, oxycodone, oxymorphone, hydrocodone, hydromorphone, meperidine, tramadol, tapentadol, buprenorphine, fentanyl, methadone. **Benzodiazepine medications include: diazepam (Valium), alprazolam (Xanax), clonazepam (Klonopine), lorazepam (Ativan), clorazepate (Tranxene), chlordiazepoxide (Librium), estazolam (Prosom), oxazepam (Serax), temazepam (Restoril), triazolam (Halcion)  ____________________________________________________________________________________________ BMI Assessment: Estimated body mass index is 33.97 kg/m as calculated from the following:   Height as of this encounter: 5' 9"  (1.753 m).   Weight as of this encounter: 230 lb (104.3 kg).  BMI interpretation table: BMI level Category Range association with higher incidence of chronic pain  <18 kg/m2 Underweight   18.5-24.9 kg/m2 Ideal body weight   25-29.9 kg/m2 Overweight Increased incidence by 20%  30-34.9 kg/m2 Obese (Class I) Increased incidence by 68%  35-39.9 kg/m2 Severe obesity (Class II) Increased incidence by 136%  >40 kg/m2 Extreme obesity (Class III) Increased incidence by 254%   BMI Readings from Last 4 Encounters:  11/26/16 33.97 kg/m  08/26/16 33.97 kg/m  06/03/16 33.23 kg/m  05/28/16 33.97 kg/m   Wt Readings from Last 4 Encounters:  11/26/16 230 lb (104.3 kg)  08/26/16 230 lb (104.3 kg)  06/03/16 225 lb (102.1 kg)  05/28/16 230 lb (104.3 kg)  GENERAL RISKS AND COMPLICATIONS  What are the risk, side  effects and possible complications? Generally speaking, most procedures are safe.  However, with any procedure there are risks, side effects, and the possibility of complications.  The risks and complications are dependent upon the sites that are lesioned, or the type of nerve block to be performed.  The closer the procedure is to the spine, the more serious the risks are.  Great care is taken when placing the radio frequency needles, block needles or lesioning probes, but sometimes complications can occur. 1. Infection: Any time there is an injection through the skin, there is a risk of infection.  This is why sterile conditions are used for these blocks.  There are four possible types of infection. 1. Localized skin infection. 2. Central Nervous System Infection-This can be in the form of Meningitis, which can be deadly. 3. Epidural Infections-This can be in the form of an epidural abscess, which can cause pressure inside of the spine, causing compression of the spinal cord with subsequent paralysis. This would require an emergency surgery to decompress, and there are no guarantees that the patient would recover from the paralysis. 4. Discitis-This is an infection of the intervertebral discs.  It occurs in about 1% of discography procedures.  It is difficult to treat and it may lead to surgery.        2. Pain: the needles have to go through skin and soft tissues, will cause soreness.  3. Damage to internal structures:  The nerves to be lesioned may be near blood vessels or    other nerves which can be potentially damaged.       4. Bleeding: Bleeding is more common if the patient is taking blood thinners such as  aspirin, Coumadin, Ticiid, Plavix, etc., or if he/she have some genetic predisposition  such as hemophilia. Bleeding into the spinal canal can cause compression of the spinal  cord with subsequent paralysis.  This would require an emergency surgery to  decompress and there are no guarantees  that the patient would recover from the  paralysis.       5. Pneumothorax:  Puncturing of a lung is a possibility, every time a needle is introduced in  the area of the chest or upper back.  Pneumothorax refers to free air around the  collapsed lung(s), inside of the thoracic cavity (chest cavity).  Another two possible  complications related to a similar event would include: Hemothorax and Chylothorax.   These are variations of the Pneumothorax, where instead of air around the collapsed  lung(s), you may have blood or chyle, respectively.       6. Spinal headaches: They may occur with any procedures in the area of the spine.       7. Persistent CSF (Cerebro-Spinal Fluid) leakage: This is a rare problem, but may occur  with prolonged intrathecal or epidural catheters either due to the formation of a fistulous  track or a dural tear.       8. Nerve damage: By working so close to the spinal cord, there is always a possibility of  nerve damage, which could be as serious as a permanent spinal cord injury with  paralysis.       9. Death:  Although rare, severe deadly allergic reactions known as "Anaphylactic  reaction" can occur to any of the medications used.      10. Worsening of the symptoms:  We can always make thing worse.  What are the chances of something like this happening? Chances of any of this occuring are extremely low.  By statistics, you have more of a chance of getting killed in a motor vehicle accident: while driving to the hospital than any of the above occurring .  Nevertheless, you should be aware that they are possibilities.  In general, it is similar to taking a shower.  Everybody knows that you can slip, hit your head and get killed.  Does that mean that you should not shower again?  Nevertheless always keep in mind that statistics do not mean anything if you happen to be on the wrong side of them.  Even if a procedure has a 1 (one) in a 1,000,000 (million) chance of going wrong, it you  happen to be that one..Also, keep in mind that by statistics, you have more of a chance of having something go wrong when taking medications.  Who should not have this procedure? If you are on a blood thinning medication (e.g. Coumadin, Plavix, see list of "Blood Thinners"), or if you have an active infection going on, you should not have the procedure.  If you are taking any blood thinners, please inform your physician.  How should I prepare for this procedure?  Do not eat or drink anything at least six hours prior to the procedure.  Bring a driver with you .  It cannot be a taxi.  Come accompanied by an adult that can drive you back, and  that is strong enough to help you if your legs get weak or numb from the local anesthetic.  Take all of your medicines the morning of the procedure with just enough water to swallow them.  If you have diabetes, make sure that you are scheduled to have your procedure done first thing in the morning, whenever possible.  If you have diabetes, take only half of your insulin dose and notify our nurse that you have done so as soon as you arrive at the clinic.  If you are diabetic, but only take blood sugar pills (oral hypoglycemic), then do not take them on the morning of your procedure.  You may take them after you have had the procedure.  Do not take aspirin or any aspirin-containing medications, at least eleven (11) days prior to the procedure.  They may prolong bleeding.  Wear loose fitting clothing that may be easy to take off and that you would not mind if it got stained with Betadine or blood.  Do not wear any jewelry or perfume  Remove any nail coloring.  It will interfere with some of our monitoring equipment.  NOTE: Remember that this is not meant to be interpreted as a complete list of all possible complications.  Unforeseen problems may occur.  BLOOD THINNERS The following drugs contain aspirin or other products, which can cause increased  bleeding during surgery and should not be taken for 2 weeks prior to and 1 week after surgery.  If you should need take something for relief of minor pain, you may take acetaminophen which is found in Tylenol,m Datril, Anacin-3 and Panadol. It is not blood thinner. The products listed below are.  Do not take any of the products listed below in addition to any listed on your instruction sheet.  A.P.C or A.P.C with Codeine Codeine Phosphate Capsules #3 Ibuprofen Ridaura  ABC compound Congesprin Imuran rimadil  Advil Cope Indocin Robaxisal  Alka-Seltzer Effervescent Pain Reliever and Antacid Coricidin or Coricidin-D  Indomethacin Rufen  Alka-Seltzer plus Cold Medicine Cosprin Ketoprofen S-A-C Tablets  Anacin Analgesic Tablets or Capsules Coumadin Korlgesic Salflex  Anacin Extra Strength Analgesic tablets or capsules CP-2 Tablets Lanoril Salicylate  Anaprox Cuprimine Capsules Levenox Salocol  Anexsia-D Dalteparin Magan Salsalate  Anodynos Darvon compound Magnesium Salicylate Sine-off  Ansaid Dasin Capsules Magsal Sodium Salicylate  Anturane Depen Capsules Marnal Soma  APF Arthritis pain formula Dewitt's Pills Measurin Stanback  Argesic Dia-Gesic Meclofenamic Sulfinpyrazone  Arthritis Bayer Timed Release Aspirin Diclofenac Meclomen Sulindac  Arthritis pain formula Anacin Dicumarol Medipren Supac  Analgesic (Safety coated) Arthralgen Diffunasal Mefanamic Suprofen  Arthritis Strength Bufferin Dihydrocodeine Mepro Compound Suprol  Arthropan liquid Dopirydamole Methcarbomol with Aspirin Synalgos  ASA tablets/Enseals Disalcid Micrainin Tagament  Ascriptin Doan's Midol Talwin  Ascriptin A/D Dolene Mobidin Tanderil  Ascriptin Extra Strength Dolobid Moblgesic Ticlid  Ascriptin with Codeine Doloprin or Doloprin with Codeine Momentum Tolectin  Asperbuf Duoprin Mono-gesic Trendar  Aspergum Duradyne Motrin or Motrin IB Triminicin  Aspirin plain, buffered or enteric coated Durasal Myochrisine Trigesic   Aspirin Suppositories Easprin Nalfon Trillsate  Aspirin with Codeine Ecotrin Regular or Extra Strength Naprosyn Uracel  Atromid-S Efficin Naproxen Ursinus  Auranofin Capsules Elmiron Neocylate Vanquish  Axotal Emagrin Norgesic Verin  Azathioprine Empirin or Empirin with Codeine Normiflo Vitamin E  Azolid Emprazil Nuprin Voltaren  Bayer Aspirin plain, buffered or children's or timed BC Tablets or powders Encaprin Orgaran Warfarin Sodium  Buff-a-Comp Enoxaparin Orudis Zorpin  Buff-a-Comp with Codeine Equegesic Os-Cal-Gesic   Buffaprin Excedrin plain, buffered  or Extra Strength Oxalid   Bufferin Arthritis Strength Feldene Oxphenbutazone   Bufferin plain or Extra Strength Feldene Capsules Oxycodone with Aspirin   Bufferin with Codeine Fenoprofen Fenoprofen Pabalate or Pabalate-SF   Buffets II Flogesic Panagesic   Buffinol plain or Extra Strength Florinal or Florinal with Codeine Panwarfarin   Buf-Tabs Flurbiprofen Penicillamine   Butalbital Compound Four-way cold tablets Penicillin   Butazolidin Fragmin Pepto-Bismol   Carbenicillin Geminisyn Percodan   Carna Arthritis Reliever Geopen Persantine   Carprofen Gold's salt Persistin   Chloramphenicol Goody's Phenylbutazone   Chloromycetin Haltrain Piroxlcam   Clmetidine heparin Plaquenil   Cllnoril Hyco-pap Ponstel   Clofibrate Hydroxy chloroquine Propoxyphen         Before stopping any of these medications, be sure to consult the physician who ordered them.  Some, such as Coumadin (Warfarin) are ordered to prevent or treat serious conditions such as "deep thrombosis", "pumonary embolisms", and other heart problems.  The amount of time that you may need off of the medication may also vary with the medication and the reason for which you were taking it.  If you are taking any of these medications, please make sure you notify your pain physician before you undergo any procedures.

## 2016-12-01 LAB — TOXASSURE SELECT 13 (MW), URINE

## 2016-12-18 DIAGNOSIS — G629 Polyneuropathy, unspecified: Secondary | ICD-10-CM | POA: Insufficient documentation

## 2016-12-25 DIAGNOSIS — M960 Pseudarthrosis after fusion or arthrodesis: Secondary | ICD-10-CM | POA: Insufficient documentation

## 2016-12-25 DIAGNOSIS — M549 Dorsalgia, unspecified: Secondary | ICD-10-CM | POA: Insufficient documentation

## 2016-12-25 NOTE — Progress Notes (Addendum)
Patient's Name: Kenneth Osborn  MRN: [id number]  Referring Provider: Madelyn Brunner, MD  DOB: Apr 13, 1955  PCP: Baxter Hire, MD  DOS: 12/26/2016  Note by: Gaspar Cola, MD  Service setting: Ambulatory outpatient  Specialty: Interventional Pain Management  Patient type: Established  Location: [facility] (AMB) Pain Management Facility  Visit type: Interventional Procedure   Primary Reason for Visit: Interventional Pain Management Treatment. CC: Back Pain (low)  Procedure:  Anesthesia, Analgesia, Anxiolysis:  Type: Trigger Point Injection (1-2 muscle groups) (posterior superior iliac spine) #2 CPT: 20552 Region:Posterior Lumbar Level: Lumbosacral Laterality: Right Paraspinal  Type: Local Anesthesia Local Anesthetic: Lidocaine 1% Route: Infiltration ([place]/IM) IV Access: Declined Sedation: Declined  Indication(s): Analgesia           Indications: 1. Chronic musculoskeletal pain   2. Trigger point posterior superior iliac spine (PSIS) (Right)   3. Chronic low back pain (Bilateral) (L>R)   4. Failed back surgical syndrome   5. Lumbar facet syndrome (Bilateral)   6. Lumbar spondylosis   7. Encounter for interrogation of neurostimulator   8. Malfunction of spinal cord stimulator, initial encounter ([place])   9. [employer] spinal cord stimulator (implant date: 11/01/2014)    Pain Score: Pre-procedure: 4 /10 Post-procedure: 0-No pain/10  Pre-op Assessment:  Kenneth Osborn is a 61 y.o. (year old), male patient, seen today for interventional treatment. He  has a past surgical history that includes Cardiovascular stress test; Cardiac catheterization; Coronary angioplasty; Labrinthectomy; mastoid shunt; Appendectomy; Back surgery; ARTHRODESIS ANTERIOR ANTERIOR CERVICLE SPINE (01/04/2013); Spinal cord stimulator implant (Right); Left Heart Cath and Coronary Angiography (N/A, 06/15/2015); Coronary Stent Intervention (N/A, 06/15/2015); COLONOSCOPY WITH PROPOFOL (N/A, 09/19/2014);  ESOPHAGOGASTRODUODENOSCOPY (EGD) (N/A, 09/19/2014); SAVORY DILATION (N/A, 09/19/2014); and SHOULDER ARTHROSCOPY WITH SUBACROMIAL DECOMPRESSION (Left, 04/06/2012). Kenneth Osborn has a current medication list which includes the following prescription(s): accu-chek aviva plus, alprazolam, aspirin ec, cetirizine, cyclobenzaprine, diazepam, diltiazem, enalapril, esomeprazole, ezetimibe, gabapentin, insulin glargine, insulin lispro, insulin lispro prot & lispro, insulin syringe-needle u-100, magnesium oxide, meclizine, metformin, metoprolol succinate, montelukast, naloxone, niacin, fish oil, oxycodone hcl, oxycodone hcl, oxycodone hcl, prasugrel, ranitidine, rosuvastatin, triamterene-hydrochlorothiazide, venlafaxine, and montelukast. His primarily concern today is the Back Pain (low)  The patient indicates that the spinal cord stimulator does not seem to be working well and therefore we have ordered a readout of the device as well as some x-rays of the lumbar and thoracic spine, to identify whether or not there are any mechanical problems with the leads.  Initial Vital Signs: There were no vitals taken for this visit. BMI: Estimated body mass index is 33.23 kg/m as calculated from the following:   Height as of this encounter: 5' 9"  (1.753 m).   Weight as of this encounter: 225 lb (102.1 kg).  Risk Assessment: Allergies: Reviewed. He has No Known Allergies.  Allergy Precautions: None required Coagulopathies: Reviewed. None identified.  Blood-thinner therapy: None at this time Active Infection(s): Reviewed. None identified. Kenneth Osborn is afebrile  Site Confirmation: Kenneth Osborn was asked to confirm the procedure and laterality before marking the site Procedure checklist: Completed Consent: Before the procedure and under the influence of no sedative(s), amnesic(s), or anxiolytics, the patient was informed of the treatment options, risks and possible complications. To fulfill our ethical and legal obligations, as  recommended by the American Medical Association's Code of Ethics, I have informed the patient of my clinical impression; the nature and purpose of the treatment or procedure; the risks, benefits, and possible complications of the intervention; the alternatives, including  doing nothing; the risk(s) and benefit(s) of the alternative treatment(s) or procedure(s); and the risk(s) and benefit(s) of doing nothing. The patient was provided information about the general risks and possible complications associated with the procedure. These may include, but are not limited to: failure to achieve desired goals, infection, bleeding, organ or nerve damage, allergic reactions, paralysis, and death. In addition, the patient was informed of those risks and complications associated to the procedure, such as failure to decrease pain; infection; bleeding; organ or nerve damage with subsequent damage to sensory, motor, and/or autonomic systems, resulting in permanent pain, numbness, and/or weakness of one or several areas of the body; allergic reactions; (i.e.: anaphylactic reaction); and/or death. Furthermore, the patient was informed of those risks and complications associated with the medications. These include, but are not limited to: allergic reactions (i.e.: anaphylactic or anaphylactoid reaction(s)); adrenal axis suppression; blood sugar elevation that in diabetics may result in ketoacidosis or comma; water retention that in patients with history of congestive heart failure may result in shortness of breath, pulmonary edema, and decompensation with resultant heart failure; weight gain; swelling or edema; medication-induced neural toxicity; particulate matter embolism and blood vessel occlusion with resultant organ, and/or nervous system infarction; and/or aseptic necrosis of one or more joints. Finally, the patient was informed that Medicine is not an exact science; therefore, there is also the possibility of unforeseen or  unpredictable risks and/or possible complications that may result in a catastrophic outcome. The patient indicated having understood very clearly. We have given the patient no guarantees and we have made no promises. Enough time was given to the patient to ask questions, all of which were answered to the patient's satisfaction. Mr. Biggins has indicated that he wanted to continue with the procedure. Attestation: I, the ordering provider, attest that I have discussed with the patient the benefits, risks, side-effects, alternatives, likelihood of achieving goals, and potential problems during recovery for the procedure that I have provided informed consent. Date: 12/26/2016; Time: 6:58 PM  Pre-Procedure Preparation:  Monitoring: As per clinic protocol. Respiration, ETCO2, SpO2, BP, heart rate and rhythm monitor placed and checked for adequate function Safety Precautions: Patient was assessed for positional comfort and pressure points before starting the procedure. Time-out: I initiated and conducted the "Time-out" before starting the procedure, as per protocol. The patient was asked to participate by confirming the accuracy of the "Time Out" information. Verification of the correct person, site, and procedure were performed and confirmed by me, the nursing staff, and the patient. "Time-out" conducted as per Joint Commission's Universal Protocol (UP.01.01.01). "Time-out" Date & Time: 12/26/2016; 0958 hrs.  Description of Procedure Process:   Position: Prone Target Area: Trigger Point Approach: Ipsilateral approach. Area Prepped: Entire Posterior Lumbosacral Region Prepping solution: ChloraPrep (2% chlorhexidine gluconate and 70% isopropyl alcohol) Safety Precautions: Aspiration looking for blood return was conducted prior to all injections. At no point did we inject any substances, as a needle was being advanced. No attempts were made at seeking any paresthesias. Safe injection practices and needle disposal  techniques used. Medications properly checked for expiration dates. SDV (single dose vial) medications used. Description of the Procedure: Protocol guidelines were followed. The patient was placed in position over the fluoroscopy table. The target area was identified and the area prepped in the usual manner. Skin desensitized using vapocoolant spray. Skin & deeper tissues infiltrated with local anesthetic. Appropriate amount of time allowed to pass for local anesthetics to take effect. The procedure needles were then advanced to the target  area. Proper needle placement secured. Negative aspiration confirmed. Solution injected in intermittent fashion, asking for systemic symptoms every 0.5cc of injectate. The needles were then removed and the area cleansed, making sure to leave some of the prepping solution back to take advantage of its long term bactericidal properties. Vitals:   12/26/16 0935 12/26/16 1000 12/26/16 1002  BP: 119/75 111/67 (!) 127/59  Pulse: (!) 109 95 95  Resp: 16 16 16   Temp: 98.8 F (37.1 C)    TempSrc: Oral    SpO2: 98% 98% 98%  Weight: 225 lb (102.1 kg)    Height: 5' 9"  (1.753 m)      Start Time: 1000 hrs. End Time: 1002 hrs. Materials:  Needle(s) Type: Epidural needle Gauge: 25G Length: 1.5-in Medication(s): We administered lidocaine, triamcinolone acetonide, and ropivacaine (PF) 2 mg/mL (0.2%). Please see chart orders for dosing details.  Imaging Guidance:  Type of Imaging Technique: None used Indication(s): N/A Exposure Time: No patient exposure Contrast: None used. Fluoroscopic Guidance: N/A Ultrasound Guidance: N/A Interpretation: N/A  Antibiotic Prophylaxis:  Indication(s): None identified Antibiotic given: None  Post-operative Assessment:  EBL: None Complications: No immediate post-treatment complications observed by team, or reported by patient. Note: The patient tolerated the entire procedure well. A repeat set of vitals were taken after the  procedure and the patient was kept under observation following institutional policy, for this type of procedure. Post-procedural neurological assessment was performed, showing return to baseline, prior to discharge. The patient was provided with post-procedure discharge instructions, including a section on how to identify potential problems. Should any problems arise concerning this procedure, the patient was given instructions to immediately contact us, at any time, without hesitation. In any case, we plan to contact the patient by telephone for a follow-up status report regarding this interventional procedure. Comments:  No additional relevant information.  Plan of Care    Imaging Orders     DG Lumbar Spine Complete W/Bend     DG Thoracic Spine 2 View  Procedure Orders     TRIGGER POINT INJECTION  Medications ordered for procedure: Meds ordered this encounter  Medications  . lidocaine (XYLOCAINE) 2 % (with pres) injection 200 mg  . triamcinolone acetonide (KENALOG-40) injection 40 mg  . ropivacaine (PF) 2 mg/mL (0.2%) (NAROPIN) injection 9 mL   Medications administered: We administered lidocaine, triamcinolone acetonide, and ropivacaine (PF) 2 mg/mL (0.2%).  See the medical record for exact dosing, route, and time of administration.  This SmartLink is deprecated. Use AVSMEDLIST instead to display the medication list for a patient. Disposition: Discharge home  Discharge Date & Time: 12/26/2016; 1030 hrs.   Physician-requested Follow-up: Return for Med-Mgmt + post-procedure eval (w/ Dionisio David, NP). Future Appointments  Date Time Provider [place]  02/26/2017  8:30 AM Vevelyn Francois, NP [facility] None   Primary Care Physician: Baxter Hire, MD Location: [facility] Outpatient Pain Management Facility Note by: Gaspar Cola, MD Date: 12/26/2016; Time: 11:08 AM  Disclaimer:  Medicine is not an Chief Strategy Officer. The only guarantee in medicine is that nothing is guaranteed.  It is important to note that the decision to proceed with this intervention was based on the information collected from the patient. The Data and conclusions were drawn from the patient's questionnaire, the interview, and the physical examination. Because the information was provided in large part by the patient, it cannot be guaranteed that it has not been purposely or unconsciously manipulated. Every effort has been made to obtain as much relevant data  as possible for this evaluation. It is important to note that the conclusions that lead to this procedure are derived in large part from the available data. Always take into account that the treatment will also be dependent on availability of resources and existing treatment guidelines, considered by other Pain Management Practitioners as being common knowledge and practice, at the time of the intervention. For Medico-Legal purposes, it is also important to point out that variation in procedural techniques and pharmacological choices are the acceptable norm. The indications, contraindications, technique, and results of the above procedure should only be interpreted and judged by a Board-Certified Interventional Pain Specialist with extensive familiarity and expertise in the same exact procedure and technique. there

## 2016-12-26 ENCOUNTER — Other Ambulatory Visit: Payer: Self-pay

## 2016-12-26 ENCOUNTER — Encounter: Payer: Self-pay | Admitting: Pain Medicine

## 2016-12-26 ENCOUNTER — Ambulatory Visit
Admission: RE | Admit: 2016-12-26 | Discharge: 2016-12-26 | Disposition: A | Discharge status: Home / Self Care | Payer: Medicare Other | Source: Ambulatory Visit | Attending: Pain Medicine | Admitting: Pain Medicine

## 2016-12-26 ENCOUNTER — Ambulatory Visit: Payer: Medicare Other | Attending: Pain Medicine | Admitting: Pain Medicine

## 2016-12-26 VITALS — BP 127/59 | HR 95 | Temp 98.8°F | Resp 16 | Ht 69.0 in | Wt 225.0 lb

## 2016-12-26 DIAGNOSIS — M47816 Spondylosis without myelopathy or radiculopathy, lumbar region: Secondary | ICD-10-CM | POA: Insufficient documentation

## 2016-12-26 DIAGNOSIS — G8929 Other chronic pain: Secondary | ICD-10-CM | POA: Diagnosis not present

## 2016-12-26 DIAGNOSIS — T85192A Other mechanical complication of implanted electronic neurostimulator (electrode) of spinal cord, initial encounter: Secondary | ICD-10-CM | POA: Insufficient documentation

## 2016-12-26 DIAGNOSIS — Z9889 Other specified postprocedural states: Secondary | ICD-10-CM

## 2016-12-26 DIAGNOSIS — X58XXXA Exposure to other specified factors, initial encounter: Secondary | ICD-10-CM | POA: Diagnosis not present

## 2016-12-26 DIAGNOSIS — Z9689 Presence of other specified functional implants: Secondary | ICD-10-CM | POA: Insufficient documentation

## 2016-12-26 DIAGNOSIS — M549 Dorsalgia, unspecified: Secondary | ICD-10-CM

## 2016-12-26 DIAGNOSIS — Z462 Encounter for fitting and adjustment of other devices related to nervous system and special senses: Secondary | ICD-10-CM

## 2016-12-26 DIAGNOSIS — M7918 Myalgia, other site: Secondary | ICD-10-CM | POA: Diagnosis not present

## 2016-12-26 DIAGNOSIS — M5442 Lumbago with sciatica, left side: Principal | ICD-10-CM

## 2016-12-26 DIAGNOSIS — M961 Postlaminectomy syndrome, not elsewhere classified: Secondary | ICD-10-CM

## 2016-12-26 MED ORDER — LIDOCAINE HCL 2 % IJ SOLN
10.0000 mL | Freq: Once | INTRAMUSCULAR | Status: AC
  Administered 2016-12-26: 400 mg
  Filled 2016-12-26: qty 20

## 2016-12-26 MED ORDER — ROPIVACAINE HCL 2 MG/ML IJ SOLN
9.0000 mL | Freq: Once | INTRAMUSCULAR | Status: AC
  Administered 2016-12-26: 10 mL via PERINEURAL
  Filled 2016-12-26: qty 10

## 2016-12-26 MED ORDER — TRIAMCINOLONE ACETONIDE 40 MG/ML IJ SUSP
40.0000 mg | Freq: Once | INTRAMUSCULAR | Status: AC
  Administered 2016-12-26: 40 mg
  Filled 2016-12-26: qty 1

## 2016-12-26 NOTE — Patient Instructions (Signed)

## 2016-12-27 ENCOUNTER — Telehealth: Payer: Self-pay

## 2016-12-27 NOTE — Telephone Encounter (Signed)
Post procedure phone call.  States he is doing good.

## 2017-02-24 ENCOUNTER — Ambulatory Visit: Payer: Medicare Other | Admitting: Internal Medicine

## 2017-02-26 ENCOUNTER — Other Ambulatory Visit: Payer: Self-pay

## 2017-02-26 ENCOUNTER — Ambulatory Visit: Payer: Medicare Other | Attending: Nurse Practitioner | Admitting: Nurse Practitioner

## 2017-02-26 VITALS — BP 127/73 | HR 86 | Temp 97.7°F | Resp 16 | Ht 69.0 in | Wt 225.0 lb

## 2017-02-26 DIAGNOSIS — M7918 Myalgia, other site: Secondary | ICD-10-CM | POA: Insufficient documentation

## 2017-02-26 DIAGNOSIS — I129 Hypertensive chronic kidney disease with stage 1 through stage 4 chronic kidney disease, or unspecified chronic kidney disease: Secondary | ICD-10-CM | POA: Insufficient documentation

## 2017-02-26 DIAGNOSIS — G4733 Obstructive sleep apnea (adult) (pediatric): Secondary | ICD-10-CM | POA: Insufficient documentation

## 2017-02-26 DIAGNOSIS — G8929 Other chronic pain: Secondary | ICD-10-CM | POA: Diagnosis not present

## 2017-02-26 DIAGNOSIS — M5412 Radiculopathy, cervical region: Secondary | ICD-10-CM

## 2017-02-26 DIAGNOSIS — M4802 Spinal stenosis, cervical region: Secondary | ICD-10-CM | POA: Insufficient documentation

## 2017-02-26 DIAGNOSIS — I252 Old myocardial infarction: Secondary | ICD-10-CM | POA: Diagnosis not present

## 2017-02-26 DIAGNOSIS — M5416 Radiculopathy, lumbar region: Secondary | ICD-10-CM

## 2017-02-26 DIAGNOSIS — I6523 Occlusion and stenosis of bilateral carotid arteries: Secondary | ICD-10-CM | POA: Insufficient documentation

## 2017-02-26 DIAGNOSIS — Z79891 Long term (current) use of opiate analgesic: Secondary | ICD-10-CM | POA: Diagnosis not present

## 2017-02-26 DIAGNOSIS — E1140 Type 2 diabetes mellitus with diabetic neuropathy, unspecified: Secondary | ICD-10-CM | POA: Diagnosis not present

## 2017-02-26 DIAGNOSIS — R251 Tremor, unspecified: Secondary | ICD-10-CM | POA: Insufficient documentation

## 2017-02-26 DIAGNOSIS — Z7982 Long term (current) use of aspirin: Secondary | ICD-10-CM | POA: Diagnosis not present

## 2017-02-26 DIAGNOSIS — I471 Supraventricular tachycardia: Secondary | ICD-10-CM | POA: Insufficient documentation

## 2017-02-26 DIAGNOSIS — E7801 Familial hypercholesterolemia: Secondary | ICD-10-CM | POA: Diagnosis not present

## 2017-02-26 DIAGNOSIS — M792 Neuralgia and neuritis, unspecified: Secondary | ICD-10-CM

## 2017-02-26 DIAGNOSIS — Z794 Long term (current) use of insulin: Secondary | ICD-10-CM | POA: Diagnosis not present

## 2017-02-26 DIAGNOSIS — I25110 Atherosclerotic heart disease of native coronary artery with unstable angina pectoris: Secondary | ICD-10-CM | POA: Insufficient documentation

## 2017-02-26 DIAGNOSIS — Z79899 Other long term (current) drug therapy: Secondary | ICD-10-CM | POA: Insufficient documentation

## 2017-02-26 DIAGNOSIS — M545 Low back pain: Secondary | ICD-10-CM | POA: Insufficient documentation

## 2017-02-26 DIAGNOSIS — G894 Chronic pain syndrome: Secondary | ICD-10-CM | POA: Diagnosis not present

## 2017-02-26 DIAGNOSIS — G4730 Sleep apnea, unspecified: Secondary | ICD-10-CM | POA: Diagnosis not present

## 2017-02-26 DIAGNOSIS — J449 Chronic obstructive pulmonary disease, unspecified: Secondary | ICD-10-CM | POA: Insufficient documentation

## 2017-02-26 DIAGNOSIS — M47816 Spondylosis without myelopathy or radiculopathy, lumbar region: Secondary | ICD-10-CM | POA: Diagnosis not present

## 2017-02-26 DIAGNOSIS — E1165 Type 2 diabetes mellitus with hyperglycemia: Secondary | ICD-10-CM | POA: Insufficient documentation

## 2017-02-26 DIAGNOSIS — K219 Gastro-esophageal reflux disease without esophagitis: Secondary | ICD-10-CM | POA: Diagnosis not present

## 2017-02-26 DIAGNOSIS — N183 Chronic kidney disease, stage 3 (moderate): Secondary | ICD-10-CM | POA: Diagnosis not present

## 2017-02-26 DIAGNOSIS — E1122 Type 2 diabetes mellitus with diabetic chronic kidney disease: Secondary | ICD-10-CM | POA: Diagnosis not present

## 2017-02-26 DIAGNOSIS — G709 Myoneural disorder, unspecified: Secondary | ICD-10-CM | POA: Insufficient documentation

## 2017-02-26 DIAGNOSIS — K2270 Barrett's esophagus without dysplasia: Secondary | ICD-10-CM | POA: Insufficient documentation

## 2017-02-26 MED ORDER — OXYCODONE HCL 10 MG PO TABS: 10.0000 mg | ORAL_TABLET | Freq: Four times a day (QID) | ORAL | 0 refills | Status: DC | PRN

## 2017-02-26 MED ORDER — CYCLOBENZAPRINE HCL 10 MG PO TABS: 10.0000 mg | ORAL_TABLET | Freq: Three times a day (TID) | ORAL | 0 refills | Status: DC | PRN

## 2017-02-26 MED ORDER — GABAPENTIN 800 MG PO TABS: 1600.0000 mg | ORAL_TABLET | Freq: Two times a day (BID) | ORAL | 0 refills | Status: DC

## 2017-02-26 NOTE — Progress Notes (Signed)
Patient's Name: Kenneth Osborn  MRN: [id number]  Referring Provider: Madelyn Brunner, MD  DOB: Jun 06, 1955  PCP: Kenneth Osborn  DOS: 02/26/2017  Note by: Kenneth Francois NP  Service setting: Ambulatory outpatient  Specialty: Interventional Pain Management  Location: [facility] (AMB) Pain Management Facility    Patient type: Established    Primary Reason(s) for Visit: Encounter for prescription drug management & post-procedure evaluation of chronic illness with mild to moderate exacerbation(Level of risk: moderate) CC: Back Pain (lower)  HPI  Kenneth Osborn is a 62 y.o. year old, male patient, who comes today for a post-procedure evaluation and medication management. 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 by Kenneth [facility] at [facility] on 01/04/2013); Long term current use of opiate analgesic; Long term prescription opiate use; Opiate use (60 MME/Day); Opiate dependence (Kenneth Osborn); Encounter for therapeutic drug level monitoring; Neurogenic pain; Thoracic facet syndrome (T8-10); Lumbar facet syndrome (Bilateral); Cervical facet syndrome (Right); Cervical spondylosis; Lumbar spondylosis; Chronic upper extremity pain (Left); Chronic cervical radicular pain (Left); Chronic upper back pain; History of thoracic spine surgery (S/P T9-10 IVD spacer); Failed back surgical syndrome; Chronic musculoskeletal pain; Myofascial pain; Chronic lower extremity pain (Left); Chronic radicular lumbar pain (left L4 dermatomal pain); Coronary artery disease; History of MI (myocardial infarction) (May 2012); Chronic  anticoagulation (on Eff[place]); History of Meniere's disease; Sleep apnea; Type 2 diabetes mellitus with hyperglycemia (Kenneth Osborn); Dysphagia; Encounter for interrogation of neurostimulator; [employer] spinal cord stimulator (implant date: 11/01/2014); Unstable angina (Kenneth Osborn); Controlled type 2 diabetes mellitus without complication (Kenneth Osborn); Arthralgia of shoulder; CAD (coronary artery disease); Paroxysmal supraventricular tachycardia (Kenneth Osborn); Balance problem; Chronic tension-type headache, not intractable; Dizziness; Post-concussion headache; Mild cognitive impairment; 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); Bilateral carotid artery stenosis; Uncontrolled type 2 diabetes mellitus with hyperglycemia, with long-term current use of insulin (Kenneth Osborn); DM type 2 with diabetic peripheral neuropathy (Kenneth Osborn); Chronic pain syndrome; Numbness and tingling; Tachycardia with heart rate 100-120 beats per minute; Tremor; Meniere disease, bilateral; CKD (chronic kidney disease) stage 3, GFR 30-59 ml/min (Kenneth Osborn); Headache disorder; Type 2 diabetes mellitus with diabetic neuropathy (Kenneth Osborn); Trigger point posterior superior iliac spine (PSIS) (Right); 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 His primarily concern today is the Back Pain (lower)  Pain Assessment: Location:   Back Onset: More than a month ago Duration: Chronic pain Quality: Aching, Spasm, Discomfort Severity: 1 /10 (self-reported pain score)  Note: Reported level is compatible with observation.                          Effect on ADL: walk long distances Timing: Constant Modifying factors: spinal cord stim  Kenneth Osborn was last seen on 11/26/2016 for a procedure. During today's appointment we reviewed Kenneth Osborn's post-procedure results, as well as his outpatient medication regimen. He admits that his pain is stable. He denies any concerns today.  Further details on both, my assessment(s),  as well as the proposed treatment plan, please see below.  Controlled Substance Pharmacotherapy Assessment REMS (Risk Evaluation and Mitigation Strategy)  Analgesic:Oxycodone IR 10 mg every 6 hours (40 mg/day) MME/day:60 mg/day   Kenneth Osborn,  Kenneth Goltz, RN  02/26/2017  8:55 AM  Sign at close encounter Nursing Pain Medication Assessment:  Safety precautions to be maintained throughout the outpatient stay will include: orient to surroundings, keep bed in low position, maintain call bell within reach at all times, provide assistance with transfer out of bed and ambulation.  Medication Inspection Compliance: Pill count conducted under aseptic conditions, in front of the patient. Neither the pills nor the bottle was removed from the patient's sight at any time. Once count was completed pills were immediately returned to the patient in their original bottle.  Medication: See above Pill/Patch Count: 107 of 120 pills remain Pill/Patch Appearance: Markings consistent with prescribed medication Bottle Appearance: Standard pharmacy container. Clearly labeled. Filled Date: 01 / 05 / 2019 Last Medication intake:  Today   Pharmacokinetics: Liberation and absorption (onset of action): WNL Distribution (time to peak effect): WNL Metabolism and excretion (duration of action): WNL         Pharmacodynamics: Desired effects: Analgesia: Kenneth Osborn reports >50% benefit. Functional ability: Patient reports that medication allows him to accomplish basic ADLs Clinically meaningful improvement in function (CMIF): Sustained CMIF goals met Perceived effectiveness: Described as relatively effective, allowing for increase in activities of daily living (ADL) Undesirable effects: Side-effects or Adverse reactions: None reported Monitoring: [place] PMP: Online review of the past 71-monthperiod conducted. Compliant with practice rules and regulations Last UDS on record: Summary  Date Value Ref Range Status  11/26/2016  FINAL  Final    Comment:    ==================================================================== TOXASSURE SELECT 13 (MW) ==================================================================== Test                             Result       Flag       Units Drug Present and Declared for Prescription Verification   Desmethyldiazepam              150          EXPECTED   ng/mg creat   Oxazepam                       427          EXPECTED   ng/mg creat   Temazepam                      204          EXPECTED   ng/mg creat    Desmethyldiazepam, oxazepam, and temazepam are expected    metabolites of diazepam. Desmethyldiazepam and oxazepam are also    expected metabolites of other drugs, including chlordiazepoxide,    prazepam, clorazepate, and halazepam. Oxazepam is an expected    metabolite of temazepam. Oxazepam and temazepam are also    available as scheduled prescription medications.   Oxycodone                      683          EXPECTED   ng/mg creat   Oxymorphone                    1200         EXPECTED   ng/mg creat   Noroxycodone                   488          EXPECTED   ng/mg creat  Noroxymorphone                 442          EXPECTED   ng/mg creat    Sources of oxycodone are scheduled prescription medications.    Oxymorphone, noroxycodone, and noroxymorphone are expected    metabolites of oxycodone. Oxymorphone is also available as a    scheduled prescription medication. Drug Absent but Declared for Prescription Verification   Alprazolam                     Not Detected UNEXPECTED ng/mg creat ==================================================================== Test                      Result    Flag   Units      Ref Range   Creatinine              48               mg/dL      >=20 ==================================================================== Declared Medications:  The flagging and interpretation on this report are based on the  following declared medications.  Unexpected results  may arise from  inaccuracies in the declared medications.  **Note: The testing scope of this panel includes these medications:  Alprazolam (Xanax)  Diazepam (Valium)  Oxycodone  **Note: The testing scope of this panel does not include following  reported medications:  Aspirin (Aspirin 81)  Cetirizine (Zyrtec)  Cyclobenzaprine (Flexeril)  Diltiazem (Cardizem)  Enalapril (Vasotec)  Ezetimibe (Zetia)  Gabapentin (Neurontin)  Hydrochlorothiazide (Maxzide)  Insulin (Humalog)  Magnesium (Mag-Ox)  Meclizine (Antivert)  Metformin (Glucophage)  Metoprolol (Toprol)  Montelukast (Singulair)  Naloxone (Narcan)  Niacin (Niaspan)  Omega-3 Fatty Acids (Fish Oil)  Omeprazole (Nexium)  Prasugrel (Effient)  Ranitidine (Zantac)  Rosuvastatin (Crestor)  Triamterene (Maxzide)  Venlafaxine (Effexor) ==================================================================== For clinical consultation, please call [phone]. ====================================================================    UDS interpretation: Compliant          Medication Assessment Form: Reviewed. Patient indicates being compliant with therapy Treatment compliance: Compliant Risk Assessment Profile: Aberrant behavior: See prior evaluations. None observed or detected today Comorbid factors increasing risk of overdose: See prior notes. No additional risks detected today Risk of substance use disorder (SUD): Low Opioid Risk Tool - 02/26/17 0850      Family History of Substance Abuse   Alcohol  Negative    Illegal Drugs  Negative    Rx Drugs  Negative      Personal History of Substance Abuse   Alcohol  Negative    Illegal Drugs  Negative    Rx Drugs  Negative      Total Score   Opioid Risk Tool Scoring  0    Opioid Risk Interpretation  Low Risk      ORT Scoring interpretation table:  Score <3 = Low Risk for SUD  Score between 4-7 = Moderate Risk for SUD  Score >8 = High Risk for Opioid Abuse   Risk  Mitigation Strategies:  Patient Counseling: Covered Patient-Prescriber Agreement (PPA): Present and active  Notification to other healthcare providers: Done  Pharmacologic Plan: No change in therapy, at this time.             Post-Procedure Assessment  12/26/2016 Procedure: Trigger point injection posterior superior Iliac spine Pre-procedure pain score:  4/10 Post-procedure pain score: 0/10         Influential Factors: BMI: 33.23 kg/m Intra-procedural challenges: None observed.  Assessment challenges: None detected.              Reported side-effects: None.        Post-procedural adverse reactions or complications: None reported         Sedation: Please see nurses note. When no sedatives are used, the analgesic levels obtained are directly associated to the effectiveness of the local anesthetics. However, when sedation is provided, the level of analgesia obtained during the initial 1 hour following the intervention, is believed to be the result of a combination of factors. These factors may include, but are not limited to: 1. The effectiveness of the local anesthetics used. 2. The effects of the analgesic(s) and/or anxiolytic(s) used. 3. The degree of discomfort experienced by the patient at the time of the procedure. 4. The patients ability and reliability in recalling and recording the events. 5. The presence and influence of possible secondary gains and/or psychosocial factors. Reported result: Relief experienced during the 1st hour after the procedure: 100 % (Ultra-Short Term Relief)            Interpretative annotation: Clinically appropriate result. Analgesia during this period is likely to be Local Anesthetic and/or IV Sedative (Analgesic/Anxiolytic) related.          Effects of local anesthetic: The analgesic effects attained during this period are directly associated to the localized infiltration of local anesthetics and therefore cary significant diagnostic value as to  the etiological location, or anatomical origin, of the pain. Expected duration of relief is directly dependent on the pharmacodynamics of the local anesthetic used. Long-acting (4-6 hours) anesthetics used.  Reported result: Relief during the next 4 to 6 hour after the procedure: 100 % (Short-Term Relief)            Interpretative annotation: Clinically appropriate result. Analgesia during this period is likely to be Local Anesthetic-related.          Long-term benefit: Defined as the period of time past the expected duration of local anesthetics (1 hour for short-acting and 4-6 hours for long-acting). With the possible exception of prolonged sympathetic blockade from the local anesthetics, benefits during this period are typically attributed to, or associated with, other factors such as analgesic sensory neuropraxia, antiinflammatory effects, or beneficial biochemical changes provided by agents other than the local anesthetics.  Reported result: Extended relief following procedure: 100 %(37month) (Long-Term Relief)            Interpretative annotation: Clinically appropriate result. Good relief. No permanent benefit expected. Inflammation plays a part in the etiology to the pain.          Current benefits: Defined as reported results that persistent at this point in time.   Analgesia: >75 % Mr. RMcclenahanreports improvement of axial symptoms. Function: Somewhat improved ROM: Somewhat improved Interpretative annotation: Ongoing benefit.  Effective palliative intervention.          Interpretation: Results would suggest therapy to have a positive impact on the patient's condition.                  Plan:  Please see "Plan of Care" for details.        Laboratory Chemistry  Inflammation Markers (CRP: Acute Phase) (ESR: Chronic Phase) Lab Results  Component Value Date   CRP 1.4 (H) 03/22/2015   ESRSEDRATE 9 03/22/2015                 Rheumatology Markers No results found for: RF, ANA, LABURIC,  URICUR, LYMEIGGIGMAB, LYMEABIGMQN  Renal Function Markers Lab Results  Component Value Date   BUN 20 06/16/2015   CREATININE 1.24 06/16/2015   GFRAA >60 06/16/2015   GFRNONAA >60 06/16/2015                 Hepatic Function Markers Lab Results  Component Value Date   AST 27 03/22/2015   ALT 34 03/22/2015   ALBUMIN 4.1 03/22/2015   ALKPHOS 84 03/22/2015                 Electrolytes Lab Results  Component Value Date   NA 139 06/16/2015   K 4.7 06/16/2015   CL 107 06/16/2015   CALCIUM 9.5 06/16/2015   MG 1.7 03/22/2015                 Neuropathy Markers No results found for: VITAMINB12, FOLATE, HGBA1C, HIV               Bone Pathology Markers No results found for: VD25OH, VD125OH2TOT, [id number], [id number], 25OHVITD1, 25OHVITD2, 25OHVITD3, TESTOFREE, TESTOSTERONE               Coagulation Parameters Lab Results  Component Value Date   INR 1.0 02/23/2014   LABPROT 12.9 02/23/2014   APTT 25.9 08/18/2013   PLT 180 06/16/2015                 Cardiovascular Markers Lab Results  Component Value Date   CKTOTAL 155 02/23/2014   CKMB 3.3 02/23/2014   TROPONINI < 0.02 02/23/2014   HGB 13.1 06/16/2015   HCT 38.5 (L) 06/16/2015                 CA Markers No results found for: CEA, CA125, LABCA2               Note: Lab results reviewed.  Recent Diagnostic Imaging Results  DG Lumbar Spine Complete W/Bend CLINICAL DATA:  Ionic upper lower back pain. Status post spinal stimulator placement in April 2018  EXAM: [place]  COMPARISON:  MRI of the lumbar spine of March 12, 2013  FINDINGS: Nerve stimulator electrodes enter the posterior paraspinous soft tissues at L 3 4 and extends superiorly to approximately T9 through T12. The lumbar vertebral bodies are preserved in height. There is spina bifida occulta at L5 and S1. There is no spondylolisthesis. There are no pars defects. The disc space heights are well maintained.  There is calcification in the wall of the abdominal aorta.  IMPRESSION: There is no acute bony abnormality of the lumbar spine. There is no significant degenerative disc disease and there is no spondylolisthesis.  Electronically Signed   By: David  Martinique M.D.   On: 12/26/2016 13:00 DG Thoracic Spine 2 View CLINICAL DATA:  Upper back pain, lower back pain, no injury  EXAM: THORACIC SPINE 2 VIEWS  COMPARISON:  Chest x-ray of 08/18/2013  FINDINGS: The thoracic vertebrae are in normal alignment. Intervertebral disc spaces appear normal. Interbody fusion plug is Breath and T8-9. Neurostimulator electrode leads overlie the mid lower thoracic spinal canal. No compression deformity is seen. No prominent paravertebral soft tissue is noted. Lower anterior cervical spine fusion plate is present which appears to be at C6-7  IMPRESSION: No acute abnormality.  Normal alignment.  Electronically Signed   By: Ivar Drape M.D.   On: 12/26/2016 13:00  Complexity Note: Imaging results reviewed. Results shared with Mr. Bagot, using Layman's terms.  Meds   Current Outpatient Medications:  .  ACCU-CHEK AVIVA PLUS test strip, , Disp: , Rfl: 2 .  ALPRAZolam (XANAX) 0.5 MG tablet, Take 0.5 mg by mouth at bedtime as needed (Meniere's disease). , Disp: , Rfl:  .  aspirin EC 81 MG tablet, Take 81 mg by mouth at bedtime. , Disp: , Rfl:  .  cetirizine (ZYRTEC) 10 MG tablet, Take 10 mg by mouth daily., Disp: , Rfl:  .  diazepam (VALIUM) 5 MG tablet, Take 5 mg by mouth 3 (three) times daily as needed (Meniere's disease). , Disp: , Rfl:  .  diltiazem (CARTIA XT) 240 MG 24 hr capsule, Take 240 mg by mouth at bedtime., Disp: , Rfl:  .  enalapril (VASOTEC) 10 MG tablet, Take 10 mg by mouth 2 (two) times daily., Disp: , Rfl:  .  esomeprazole (NEXIUM) 40 MG capsule, Take 40 mg by mouth 2 (two) times daily., Disp: , Rfl:  .  ezetimibe (ZETIA) 10 MG tablet, Take 10 mg by mouth 2 (two)  times daily. , Disp: , Rfl:  .  insulin glargine (LANTUS) 100 UNIT/ML injection, Inject 100 Units into the skin at bedtime. , Disp: , Rfl:  .  insulin lispro (HUMALOG KWIKPEN) 100 UNIT/ML KiwkPen, Inject 30 Units into the skin daily before lunch. , Disp: , Rfl:  .  Insulin Lispro Prot & Lispro (HUMALOG MIX 75/25 KWIKPEN) (75-25) 100 UNIT/ML Kwikpen, Inject 60 Units into the skin 2 (two) times daily with a meal., Disp: , Rfl:  .  Insulin Syringe-Needle U-100 (BD INSULIN SYRINGE ULTRAFINE) 31G X 15/64" 1 ML MISC, , Disp: , Rfl:  .  magnesium oxide (MAG-OX) 400 MG tablet, Take 400 mg by mouth daily. , Disp: , Rfl:  .  meclizine (ANTIVERT) 25 MG tablet, Take 25 mg by mouth 3 (three) times daily as needed for dizziness. , Disp: , Rfl:  .  metFORMIN (GLUCOPHAGE) 1000 MG tablet, Take 1,000 mg by mouth 2 (two) times daily with a meal., Disp: , Rfl:  .  metoprolol succinate (TOPROL-XL) 50 MG 24 hr tablet, Take 50 mg by mouth 2 (two) times daily. Take with or immediately following a meal. , Disp: , Rfl:  .  montelukast (SINGULAIR) 10 MG tablet, Take by mouth daily. , Disp: , Rfl:  .  naloxone (NARCAN) 2 MG/2ML injection, Inject content of syringe into thigh muscle. Call 911., Disp: 2 Syringe, Rfl: 1 .  niacin (NIASPAN) 500 MG CR tablet, Take 500 mg by mouth at bedtime., Disp: , Rfl:  .  Omega-3 Fatty Acids (FISH OIL) 1000 MG CAPS, Take 1,200 mg by mouth 2 (two) times daily. , Disp: , Rfl:  .  [START ON 05/23/2017] Oxycodone HCl 10 MG TABS, Take 1 tablet (10 mg total) by mouth every 6 (six) hours as needed., Disp: 120 tablet, Rfl: 0 .  prasugrel (EFFIENT) 10 MG TABS, Take 10 mg by mouth daily., Disp: , Rfl:  .  ranitidine (ZANTAC) 150 MG tablet, Take 150 mg by mouth at bedtime. , Disp: , Rfl:  .  rosuvastatin (CRESTOR) 20 MG tablet, Take 20 mg by mouth at bedtime., Disp: , Rfl:  .  triamterene-hydrochlorothiazide (MAXZIDE) 75-50 MG tablet, Take 1 tablet by mouth daily. , Disp: , Rfl:  .  [START ON 03/24/2017]  cyclobenzaprine (FLEXERIL) 10 MG tablet, Take 1 tablet (10 mg total) by mouth every 8 (eight) hours as needed for muscle spasms., Disp: 270 tablet, Rfl: 0 .  [START ON 03/24/2017] gabapentin (  NEURONTIN) 800 MG tablet, Take 2 tablets (1,600 mg total) by mouth 2 (two) times daily., Disp: 360 tablet, Rfl: 0 .  montelukast (SINGULAIR) 10 MG tablet, Take 10 mg by mouth daily. , Disp: , Rfl:  .  [START ON 04/23/2017] Oxycodone HCl 10 MG TABS, Take 1 tablet (10 mg total) by mouth every 6 (six) hours as needed., Disp: 120 tablet, Rfl: 0 .  [START ON 03/24/2017] Oxycodone HCl 10 MG TABS, Take 1 tablet (10 mg total) by mouth every 6 (six) hours as needed., Disp: 120 tablet, Rfl: 0 .  venlafaxine (EFFEXOR) 75 MG tablet, Take by mouth., Disp: , Rfl:   ROS  Constitutional: Denies any fever or chills Gastrointestinal: No reported hemesis, hematochezia, vomiting, or acute GI distress Musculoskeletal: Denies any acute onset joint swelling, redness, loss of ROM, or weakness Neurological: No reported episodes of acute onset apraxia, aphasia, dysarthria, agnosia, amnesia, paralysis, loss of coordination, or loss of consciousness  Allergies  Mr. Lina has No Known Allergies.  PFSH  Drug: Mr. Etheredge  reports that he does not use drugs. Alcohol:  reports that he does not drink alcohol. Tobacco:  reports that  has never smoked. he has never used smokeless tobacco. Medical:  has a past medical history of Allergic rhinitis (12/30/2012), Bronchitis, Can't get food down (08/12/2014), Chronic back pain, Concussion (09/2015), COPD (chronic obstructive pulmonary disease) ([place]), Coronary artery disease, DDD (degenerative disc disease), cervical, Diabetes mellitus without complication ([place]), Dysphagia, GERD (gastroesophageal reflux disease), History of Meniere's disease (12/21/2014), History of thoracic spine surgery (S/P T9-10 IVD spacer) (12/21/2014), Hypercholesteremia, Hyperlipidemia, Hypertension, Meniere's disease, Myocardial  infarction ([place]), Neuromuscular disorder ([place]), Psychosis ([place]), Short-segment Barrett's esophagus, and Sleep apnea. Surgical: Mr. Thivierge  has a past surgical history that includes Cardiovascular stress test; Cardiac catheterization; Coronary angioplasty; Labrinthectomy; mastoid shunt; Appendectomy; Back surgery; Shoulder arthroscopy with subacromial decompression (Left, 04/06/2012); ARTHRODESIS ANTERIOR ANTERIOR CERVICLE SPINE (01/04/2013); Colonoscopy with propofol (N/A, 09/19/2014); Esophagogastroduodenoscopy (N/A, 09/19/2014); Savory dilation (N/A, 09/19/2014); Spinal cord stimulator implant (Right); Cardiac catheterization (N/A, 06/15/2015); and Cardiac catheterization (N/A, 06/15/2015). Family: family history includes Cancer in his sister; Diabetes in his mother; Heart disease in his father, maternal aunt, maternal uncle, and mother.  Constitutional Exam  General appearance: Well nourished, well developed, and well hydrated. In no apparent acute distress Vitals:   02/26/17 0845  BP: 127/73  Pulse: 86  Resp: 16  Temp: 97.7 F (36.5 C)  SpO2: 98%  Weight: 225 lb (102.1 kg)  Height: 5' 9"  (1.753 m)   BMI Assessment: Estimated body mass index is 33.23 kg/m as calculated from the following:   Height as of this encounter: 5' 9"  (1.753 m).   Weight as of this encounter: 225 lb (102.1 kg). Psych/Mental status: Alert, oriented x 3 (person, place, & time)       Eyes: PERLA Respiratory: No evidence of acute respiratory distress  Cervical Spine Area Exam  Skin & Axial Inspection: No masses, redness, edema, swelling, or associated skin lesions Alignment: Symmetrical Functional ROM: Unrestricted ROM      Stability: No instability detected Muscle Tone/Strength: Functionally intact. No obvious neuro-muscular anomalies detected. Sensory (Neurological): Unimpaired Palpation: No palpable anomalies              Upper Extremity (UE) Exam    Side: Right upper extremity  Side: Left upper extremity  Skin &  Extremity Inspection: Skin color, temperature, and hair growth are WNL. No peripheral edema or cyanosis. No masses, redness, swelling, asymmetry, or associated skin lesions. No  contractures.  Skin & Extremity Inspection: Skin color, temperature, and hair growth are WNL. No peripheral edema or cyanosis. No masses, redness, swelling, asymmetry, or associated skin lesions. No contractures.  Functional ROM: Unrestricted ROM          Functional ROM: Unrestricted ROM          Muscle Tone/Strength: Functionally intact. No obvious neuro-muscular anomalies detected.  Muscle Tone/Strength: Functionally intact. No obvious neuro-muscular anomalies detected.  Sensory (Neurological): Unimpaired          Sensory (Neurological): Unimpaired          Palpation: No palpable anomalies              Palpation: No palpable anomalies              Specialized Test(s): Deferred         Specialized Test(s): Deferred          Thoracic Spine Area Exam  Skin & Axial Inspection: No masses, redness, or swelling Alignment: Symmetrical Functional ROM: Unrestricted ROM Stability: No instability detected Muscle Tone/Strength: Functionally intact. No obvious neuro-muscular anomalies detected. Sensory (Neurological): Unimpaired Muscle strength & Tone: No palpable anomalies  Lumbar Spine Area Exam  Skin & Axial Inspection: SCS noted Alignment: Symmetrical Functional ROM: Unrestricted ROM      Stability: No instability detected Muscle Tone/Strength: Functionally intact. No obvious neuro-muscular anomalies detected. Sensory (Neurological): Unimpaired Palpation: No palpable anomalies       Provocative Tests: Lumbar Hyperextension and rotation test: evaluation deferred today       Lumbar Lateral bending test: evaluation deferred today       Patrick's Maneuver: evaluation deferred today                    Gait & Posture Assessment  Ambulation: Unassisted Gait: Relatively normal for age and body habitus Posture: WNL   Lower  Extremity Exam    Side: Right lower extremity  Side: Left lower extremity  Skin & Extremity Inspection: Skin color, temperature, and hair growth are WNL. No peripheral edema or cyanosis. No masses, redness, swelling, asymmetry, or associated skin lesions. No contractures.  Skin & Extremity Inspection: Skin color, temperature, and hair growth are WNL. No peripheral edema or cyanosis. No masses, redness, swelling, asymmetry, or associated skin lesions. No contractures.  Functional ROM: Unrestricted ROM          Functional ROM: Unrestricted ROM          Muscle Tone/Strength: Functionally intact. No obvious neuro-muscular anomalies detected.  Muscle Tone/Strength: Functionally intact. No obvious neuro-muscular anomalies detected.  Sensory (Neurological): Unimpaired  Sensory (Neurological): Unimpaired  Palpation: No palpable anomalies  Palpation: No palpable anomalies   Assessment  Primary Diagnosis & Pertinent Problem List: The primary encounter diagnosis was Chronic cervical radicular pain (Left). 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  Status Diagnosis  Controlled Controlled Controlled 1. Chronic cervical radicular pain (Left)   2. 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 Lumbar facet syndrome (Bilateral)   4. Chronic pain syndrome   5. Musculoskeletal pain   6. Neuropathic pain   7. Long term prescription opiate use   8. Myofascial pain     Problems updated and reviewed during this visit: No problems updated. Plan of Care  Pharmacotherapy (Medications Ordered): Meds ordered this encounter  Medications  . Oxycodone HCl 10 MG TABS    Sig: Take 1 tablet (10 mg total) by mouth every 6 (six) hours as needed.    Dispense:  120 tablet    Refill:  0    Do  not place this medication, or any other prescription from our practice, on "Automatic Refill". Patient may have prescription filled one day early if pharmacy is closed on scheduled refill date. Do not fill until:05/23/2017 To last until:06/22/2017    Order Specific Question:   Supervising Provider    Answer:   Milinda Pointer [phone]  . Oxycodone HCl 10 MG TABS    Sig: Take 1 tablet (10 mg total) by mouth every 6 (six) hours as needed.    Dispense:  120 tablet    Refill:  0    Do not place this medication, or any other prescription from our practice, on "Automatic Refill". Patient may have prescription filled one day early if pharmacy is closed on scheduled refill date. Do not fill until: 04/23/2017 To last until: 05/23/2017    Order Specific Question:   Supervising Provider    Answer:   Milinda Pointer [phone]  . Oxycodone HCl 10 MG TABS    Sig: Take 1 tablet (10 mg total) by mouth every 6 (six) hours as needed.    Dispense:  120 tablet    Refill:  0    Do not place this medication, or any other prescription from our practice, on "Automatic Refill". Patient may have prescription filled one day early if pharmacy is closed on scheduled refill date. Do not fill until:03/24/2017 To last until: 04/23/2017    Order Specific Question:   Supervising Provider    Answer:   Milinda Pointer [phone]  . cyclobenzaprine (FLEXERIL) 10 MG tablet    Sig: Take 1 tablet (10 mg total) by mouth every 8 (eight) hours as needed for muscle spasms.    Dispense:  270 tablet    Refill:  0    3 Month refill. Do not add this medication to the electronic "Automatic Refill" notification system. Patient may have prescription filled one day early if pharmacy is closed on scheduled refill date.    Order Specific Question:   Supervising Provider    Answer:   Milinda Pointer [phone]  . gabapentin (NEURONTIN) 800 MG tablet    Sig: Take 2 tablets (1,600 mg total) by mouth 2 (two) times daily.    Dispense:  360 tablet     Refill:  0    Do not place this medication, or any other prescription from our practice, on "Automatic Refill". Patient may have prescription filled one day early if pharmacy is closed on scheduled refill date. (3 mo refill)    Order Specific Question:   Supervising Provider    Answer:   Milinda Pointer [phone]  This SmartLink is deprecated. Use AVSMEDLIST instead to display the medication list for a patient. Medications administered today: [name] had no medications administered during this visit. Lab-work, procedure(s), and/or referral(s): Orders Placed This Encounter  Procedures  . TRIGGER POINT INJECTION  . ToxASSURE Select 13 (MW), Urine   Imaging and/or referral(s): None  Interventional therapies: Planned, scheduled, and/or pending: None at this time. He will call for procedure in the future   Considering: None at this time.   Palliative PRN treatment(s): Right side posterior superior iliac spine Trigger PointInjection    Provider-requested follow-up: Return in about 3 months (around 05/27/2017) for MedMgmt with [facility] Osborn),  in addition, w/ Dr. Dossie Arbour.  Future Appointments  Date Time Provider [place]  05/27/2017  9:00 AM Kenneth Francois, NP [facility] None   Primary Care Physician: Kenneth Osborn Location: [facility] Note by: Kenneth Francois NP Date: 02/26/2017; Time: 3:17 PM  Pain Score Disclaimer: We use the NRS-11 scale. This is a self-reported, subjective measurement of pain severity with only modest accuracy. It is used primarily to identify changes within a particular patient. It must be understood that outpatient pain scales are significantly less accurate that those used for research, where they can be applied under ideal controlled circumstances with minimal exposure to variables. In reality, the score is likely to be a combination of pain intensity and pain affect, where pain affect  describes the degree of emotional arousal or changes in action readiness caused by the sensory experience of pain. Factors such as social and work situation, setting, emotional state, anxiety levels, expectation, and prior pain experience may influence pain perception and show large inter-individual differences that may also be affected by time variables.  Patient instructions provided during this appointment: Patient Instructions   ____________________________________________________________________________________________  Medication Rules  Applies to: All patients receiving prescriptions (written or electronic).  Pharmacy of record: Pharmacy where electronic prescriptions will be sent. If written prescriptions are taken to a different pharmacy, please inform the nursing staff. The pharmacy listed in the electronic medical record should be the one where you would like electronic prescriptions to be sent.  Prescription refills: Only during scheduled appointments. Applies to both, written and electronic prescriptions.  NOTE: The following applies primarily to controlled substances (Opioid* Pain Medications).   Patient's responsibilities: 1. Pain Pills: Bring all pain pills to every appointment (except for procedure appointments). 2. Pill Bottles: Bring pills in original pharmacy bottle. Always bring newest bottle. Bring bottle, even if empty. 3. Medication refills: You are responsible for knowing and keeping track of what medications you need refilled. The day before your appointment, write a list of all prescriptions that need to be refilled. Bring that list to your appointment and give it to the admitting nurse. Prescriptions will be written only during appointments. If you forget a medication, it will not be "Called in", "Faxed", or "electronically sent". You will need to get another appointment to get these prescribed. 4. Prescription Accuracy: You are responsible for carefully inspecting  your prescriptions before leaving our office. Have the discharge nurse carefully go over each prescription with you, before taking them home. Make sure that your name is accurately spelled, that your address is correct. Check the name and dose of your medication to make sure it is accurate. Check the number of pills, and the written instructions to make sure they are clear and accurate. Make sure that you are given enough medication to last until your next medication refill appointment. 5. Taking Medication: Take medication as prescribed. Never take more pills than instructed. Never take medication more frequently than prescribed. Taking less pills or less frequently is permitted and encouraged, when it comes to controlled substances (written prescriptions).  6. Inform other Doctors: Always inform, all of your healthcare providers, of all the medications you take. 7. Pain Medication from other Providers: You are not allowed to accept any additional pain medication from any other Doctor or Healthcare provider. There are two exceptions to this rule. (see below) In the event that you require additional pain medication, you are responsible for notifying us, as stated below. 8. Medication Agreement: You are responsible  for carefully reading and following our Medication Agreement. This must be signed before receiving any prescriptions from our practice. Safely store a copy of your signed Agreement. Violations to the Agreement will result in no further prescriptions. (Additional copies of our Medication Agreement are available upon request.) 9. Laws, Rules, & Regulations: All patients are expected to follow all Federal and [employer], [employer], Rules, [employer]. Ignorance of the Laws does not constitute a valid excuse. The use of any illegal substances is prohibited. 10. Adopted CDC guidelines & recommendations: Target dosing levels will be at or below 60 MME/day. Use of benzodiazepines** is not  recommended.  Exceptions: There are only two exceptions to the rule of not receiving pain medications from other Healthcare Providers. 1. Exception #1 (Emergencies): In the event of an emergency (i.e.: accident requiring emergency care), you are allowed to receive additional pain medication. However, you are responsible for: As soon as you are able, call our office [phone], at any time of the day or night, and leave a message stating your name, the date and nature of the emergency, and the name and dose of the medication prescribed. In the event that your call is answered by a member of our staff, make sure to document and save the date, time, and the name of the person that took your information.  2. Exception #2 (Planned Surgery): In the event that you are scheduled by another doctor or dentist to have any type of surgery or procedure, you are allowed (for a period no longer than 30 days), to receive additional pain medication, for the acute post-op pain. However, in this case, you are responsible for picking up a copy of our "Post-op Pain Management for Surgeons" handout, and giving it to your surgeon or dentist. This document is available at our office, and does not require an appointment to obtain it. Simply go to our office during business hours (Monday-Thursday from 8:00 AM to 4:00 PM) (Friday 8:00 AM to 12:00 Noon) or if you have a scheduled appointment with [name], prior to your surgery, and ask for it by name. In addition, you will need to provide [name] with your name, name of your surgeon, type of surgery, and date of procedure or surgery.  *Opioid medications include: morphine, codeine, oxycodone, oxymorphone, hydrocodone, hydromorphone, meperidine, tramadol, tapentadol, buprenorphine, fentanyl, methadone. **Benzodiazepine medications include: diazepam (Valium), alprazolam (Xanax), clonazepam (Klonopine), lorazepam (Ativan), clorazepate (Tranxene), chlordiazepoxide (Librium), estazolam (Prosom),  oxazepam (Serax), temazepam (Restoril), triazolam (Halcion)  ____________________________________________________________________________________________

## 2017-02-26 NOTE — Progress Notes (Signed)
Nursing Pain Medication Assessment:  Safety precautions to be maintained throughout the outpatient stay will include: orient to surroundings, keep bed in low position, maintain call bell within reach at all times, provide assistance with transfer out of bed and ambulation.  Medication Inspection Compliance: Pill count conducted under aseptic conditions, in front of the patient. Neither the pills nor the bottle was removed from the patient's sight at any time. Once count was completed pills were immediately returned to the patient in their original bottle.  Medication: See above Pill/Patch Count: 107 of 120 pills remain Pill/Patch Appearance: Markings consistent with prescribed medication Bottle Appearance: Standard pharmacy container. Clearly labeled. Filled Date: 01 / 05 / 2019 Last Medication intake:  Today

## 2017-02-26 NOTE — Patient Instructions (Signed)

## 2017-05-06 ENCOUNTER — Ambulatory Visit: Payer: Medicare Other | Admitting: Dietician

## 2017-05-13 ENCOUNTER — Encounter: Payer: Medicare Other | Attending: Endocrinology | Admitting: *Deleted

## 2017-05-13 ENCOUNTER — Encounter: Payer: Self-pay | Admitting: *Deleted

## 2017-05-13 VITALS — BP 90/60 | Ht 69.0 in | Wt 220.6 lb

## 2017-05-13 DIAGNOSIS — Z794 Long term (current) use of insulin: Secondary | ICD-10-CM | POA: Insufficient documentation

## 2017-05-13 DIAGNOSIS — E119 Type 2 diabetes mellitus without complications: Secondary | ICD-10-CM | POA: Diagnosis not present

## 2017-05-13 DIAGNOSIS — Z713 Dietary counseling and surveillance: Secondary | ICD-10-CM | POA: Diagnosis present

## 2017-05-13 NOTE — Patient Instructions (Signed)
Check blood sugars 4 x day before each meal and before bed every day Bring blood sugar records to the next appointment  Exercise: Walk as tolerated  Eat 3 meals day,  1-2  snacks a day Space meals 4-6 hours apart Avoid sugar sweetened drinks (juices) unless treating a low blood sugar Complete 3 Day Food Record and bring to next appt  Carry fast acting glucose and a snack at all times Rotate injection sites  Return for appointment on:  Tuesday June 10, 2017 at 11:30 am with [facility] (dietitian)

## 2017-05-13 NOTE — Progress Notes (Signed)
Diabetes Self-Management Education  Visit Type: First/Initial  Appt. Start Time: 0900 Appt. End Time: 1020  05/13/2017  [name], identified by name and date of birth, is a 62 y.o. male with a diagnosis of Diabetes: Type 2.   ASSESSMENT  Blood pressure 90/60, height 5' 9"  (1.753 m), weight 220 lb 9.6 oz (100.1 kg). Body mass index is 32.58 kg/m.  Diabetes Self-Management Education - 05/13/17 1121      Visit Information   Visit Type  First/Initial      Initial Visit   Diabetes Type  Type 2    Are you currently following a meal plan?  No    Are you taking your medications as prescribed?  Yes    Date Diagnosed  20 years ago      Health Coping   How would you rate your overall health?  Fair      Psychosocial Assessment   Patient Belief/Attitude about Diabetes  Other (comment) "it is no fun - have limitations"    Self-care barriers  None    Self-management support  Doctor's office;Family    Other persons present  Spouse/SO    Patient Concerns  Nutrition/Meal planning;Glycemic Control;Medication;Monitoring;Healthy Lifestyle    Special Needs  None    Preferred Learning Style  Visual;Auditory    Learning Readiness  Change in progress    How often do you need to have someone help you when you read instructions, pamphlets, or other written materials from your doctor or pharmacy?  1 - Never    What is the last grade level you completed in school?  12th      Pre-Education Assessment   Patient understands the diabetes disease and treatment process.  Needs Review    Patient understands incorporating nutritional management into lifestyle.  Needs Instruction    Patient undertands incorporating physical activity into lifestyle.  Needs Instruction    Patient understands using medications safely.  Needs Review    Patient understands monitoring blood glucose, interpreting and using results  Needs Review    Patient understands prevention, detection, and treatment of acute  complications.  Needs Review    Patient understands prevention, detection, and treatment of chronic complications.  Needs Review    Patient understands how to develop strategies to address psychosocial issues.  Needs Review    Patient understands how to develop strategies to promote health/change behavior.  Needs Review      Complications   Last HgB A1C per patient/outside source  10.5 % 03/12/17    How often do you check your blood sugar?  3-4 times/day    Fasting Blood glucose range (mg/dL)  <70;70-129;130-179 FBG's 80-167 mg/dL wtih 1 reading of 49 mg/dL    Postprandial Blood glucose range (mg/dL)  -- Pre-lunch readings 88-189 mg/dL with 1 reading of 252 mg/dL; pre-supper 91-165 mg/dL with 1 reading of 65 mg/dL; bedtime 119-129 mg/dL.     Number of hypoglycemic episodes per month  2    Can you tell when your blood sugar is low?  Yes    What do you do if your blood sugar is low?  drink juice    Have you had a dilated eye exam in the past 12 months?  Yes    Have you had a dental exam in the past 12 months?  Yes    Are you checking your feet?  Yes    How many days per week are you checking your feet?  7  Dietary Intake   Breakfast  bacon, egg and cheese biscuit; muffin; cereal, milk and fruit; eggs, grits, sausage or bacon    Snack (morning)  cookie, fruit    Lunch  burger, ham sandwich and fruit    Dinner  baked chicken, pork, occasional fish with potatoes, beans, peas, corn, rice, past, cuccumbers, onions, tomatoes    Snack (evening)  fruit or cookie    Beverage(s)  diet soda, little water      Exercise   Exercise Type  ADL's      Patient Education   Previous Diabetes Education  Yes - 10 years ago   Disease state   Explored patient's options for treatment of their diabetes    Nutrition management   Role of diet in the treatment of diabetes and the relationship between the three main macronutrients and blood glucose level;Carbohydrate counting;Meal timing in regards to the  patients' current diabetes medication.;Reviewed blood glucose goals for pre and post meals and how to evaluate the patients' food intake on their blood glucose level.    Physical activity and exercise   Helped patient identify appropriate exercises in relation to his/her diabetes, diabetes complications and other health issue.    Medications  Taught/reviewed insulin injection, site rotation, insulin storage and needle disposal.;Reviewed patients medication for diabetes, action, purpose, timing of dose and side effects.    Monitoring  Taught/discussed recording of test results and interpretation of SMBG.;Identified appropriate SMBG and/or A1C goals.    Acute complications  Taught treatment of hypoglycemia - the 15 rule.    Chronic complications  Relationship between chronic complications and blood glucose control    Psychosocial adjustment  Identified and addressed patients feelings and concerns about diabetes      Individualized Goals (developed by patient)   Reducing Risk  Improve blood sugars Decrease medications Prevent diabetes complications Lose weight Lead a healthier lifestyle     Outcomes   Expected Outcomes  Demonstrated interest in learning. Expect positive outcomes    Future DMSE  4-6 wks       Individualized Plan for Diabetes Self-Management Training:   Learning Objective:  Patient will have a greater understanding of diabetes self-management. Patient education plan is to attend individual and/or group sessions per assessed needs and concerns.   Plan:   Patient Instructions  Check blood sugars 4 x day before each meal and before bed every day Bring blood sugar records to the next appointment Exercise: Walk as tolerated Eat 3 meals day,  1-2  snacks a day Space meals 4-6 hours apart Avoid sugar sweetened drinks (juices) unless treating a low blood sugar Complete 3 Day Food Record and bring to next appt Carry fast acting glucose and a snack at all times Rotate injection  sites Return for appointment on:  Tuesday June 10, 2017 at 11:30 am with [facility] (dietitian)  Expected Outcomes:  Demonstrated interest in learning. Expect positive outcomes  Education material provided:  General Meal Planning Guidelines Simple Meal Plan 3 Day Food Record Symptoms, causes and treatments of Hypoglycemia  If problems or questions, patient to contact team via:  Johny Drilling, [place], [place], CDE [phone]  Future DSME appointment: 4-6 wks  June 10, 2017 with the dietitian

## 2017-05-27 ENCOUNTER — Ambulatory Visit: Payer: Medicare Other | Attending: Nurse Practitioner | Admitting: Nurse Practitioner

## 2017-05-27 ENCOUNTER — Encounter: Payer: Self-pay | Admitting: Nurse Practitioner

## 2017-05-27 ENCOUNTER — Other Ambulatory Visit: Payer: Self-pay

## 2017-05-27 VITALS — BP 111/70 | HR 78 | Temp 98.0°F | Resp 16 | Ht 69.0 in | Wt 220.0 lb

## 2017-05-27 DIAGNOSIS — Z5181 Encounter for therapeutic drug level monitoring: Secondary | ICD-10-CM | POA: Diagnosis present

## 2017-05-27 DIAGNOSIS — Z79891 Long term (current) use of opiate analgesic: Secondary | ICD-10-CM | POA: Diagnosis present

## 2017-05-27 DIAGNOSIS — M546 Pain in thoracic spine: Secondary | ICD-10-CM | POA: Diagnosis not present

## 2017-05-27 DIAGNOSIS — M47812 Spondylosis without myelopathy or radiculopathy, cervical region: Secondary | ICD-10-CM

## 2017-05-27 DIAGNOSIS — G894 Chronic pain syndrome: Secondary | ICD-10-CM

## 2017-05-27 DIAGNOSIS — M7918 Myalgia, other site: Secondary | ICD-10-CM | POA: Diagnosis not present

## 2017-05-27 DIAGNOSIS — M792 Neuralgia and neuritis, unspecified: Secondary | ICD-10-CM | POA: Diagnosis not present

## 2017-05-27 DIAGNOSIS — G8929 Other chronic pain: Secondary | ICD-10-CM | POA: Diagnosis not present

## 2017-05-27 DIAGNOSIS — M47816 Spondylosis without myelopathy or radiculopathy, lumbar region: Secondary | ICD-10-CM | POA: Diagnosis not present

## 2017-05-27 DIAGNOSIS — M549 Dorsalgia, unspecified: Secondary | ICD-10-CM

## 2017-05-27 MED ORDER — OXYCODONE HCL 10 MG PO TABS: 10.0000 mg | ORAL_TABLET | Freq: Four times a day (QID) | ORAL | 0 refills | Status: DC | PRN

## 2017-05-27 MED ORDER — CYCLOBENZAPRINE HCL 10 MG PO TABS: 10.0000 mg | ORAL_TABLET | Freq: Three times a day (TID) | ORAL | 1 refills | Status: DC | PRN

## 2017-05-27 MED ORDER — GABAPENTIN 800 MG PO TABS: 1600.0000 mg | ORAL_TABLET | Freq: Two times a day (BID) | ORAL | 1 refills | Status: DC

## 2017-05-27 NOTE — Patient Instructions (Addendum)
____________________________________________________________________________________________  Medication Rules  Applies to: All patients receiving prescriptions (written or electronic).  Pharmacy of record: Pharmacy where electronic prescriptions will be sent. If written prescriptions are taken to a different pharmacy, please inform the nursing staff. The pharmacy listed in the electronic medical record should be the one where you would like electronic prescriptions to be sent.  Prescription refills: Only during scheduled appointments. Applies to both, written and electronic prescriptions.  NOTE: The following applies primarily to controlled substances (Opioid* Pain Medications).   Patient's responsibilities: 1. Pain Pills: Bring all pain pills to every appointment (except for procedure appointments). 2. Pill Bottles: Bring pills in original pharmacy bottle. Always bring newest bottle. Bring bottle, even if empty. 3. Medication refills: You are responsible for knowing and keeping track of what medications you need refilled. The day before your appointment, write a list of all prescriptions that need to be refilled. Bring that list to your appointment and give it to the admitting nurse. Prescriptions will be written only during appointments. If you forget a medication, it will not be "Called in", "Faxed", or "electronically sent". You will need to get another appointment to get these prescribed. 4. Prescription Accuracy: You are responsible for carefully inspecting your prescriptions before leaving our office. Have the discharge nurse carefully go over each prescription with you, before taking them home. Make sure that your name is accurately spelled, that your address is correct. Check the name and dose of your medication to make sure it is accurate. Check the number of pills, and the written instructions to make sure they are clear and accurate. Make sure that you are given enough medication to last  until your next medication refill appointment. 5. Taking Medication: Take medication as prescribed. Never take more pills than instructed. Never take medication more frequently than prescribed. Taking less pills or less frequently is permitted and encouraged, when it comes to controlled substances (written prescriptions).  6. Inform other Doctors: Always inform, all of your healthcare providers, of all the medications you take. 7. Pain Medication from other Providers: You are not allowed to accept any additional pain medication from any other Doctor or Healthcare provider. There are two exceptions to this rule. (see below) In the event that you require additional pain medication, you are responsible for notifying us, as stated below. 8. Medication Agreement: You are responsible for carefully reading and following our Medication Agreement. This must be signed before receiving any prescriptions from our practice. Safely store a copy of your signed Agreement. Violations to the Agreement will result in no further prescriptions. (Additional copies of our Medication Agreement are available upon request.) 9. Laws, Rules, & Regulations: All patients are expected to follow all Federal and [employer], [employer], Rules, [employer]. Ignorance of the Laws does not constitute a valid excuse. The use of any illegal substances is prohibited. 10. Adopted CDC guidelines & recommendations: Target dosing levels will be at or below 60 MME/day. Use of benzodiazepines** is not recommended.  Exceptions: There are only two exceptions to the rule of not receiving pain medications from other Healthcare Providers. 1. Exception #1 (Emergencies): In the event of an emergency (i.e.: accident requiring emergency care), you are allowed to receive additional pain medication. However, you are responsible for: As soon as you are able, call our office [phone], at any time of the day or night, and leave a message stating your name, the  date and nature of the emergency, and the name and dose of the medication  prescribed. In the event that your call is answered by a member of our staff, make sure to document and save the date, time, and the name of the person that took your information.  2. Exception #2 (Planned Surgery): In the event that you are scheduled by another doctor or dentist to have any type of surgery or procedure, you are allowed (for a period no longer than 30 days), to receive additional pain medication, for the acute post-op pain. However, in this case, you are responsible for picking up a copy of our "Post-op Pain Management for Surgeons" handout, and giving it to your surgeon or dentist. This document is available at our office, and does not require an appointment to obtain it. Simply go to our office during business hours (Monday-Thursday from 8:00 AM to 4:00 PM) (Friday 8:00 AM to 12:00 Noon) or if you have a scheduled appointment with [name], prior to your surgery, and ask for it by name. In addition, you will need to provide [name] with your name, name of your surgeon, type of surgery, and date of procedure or surgery.  *Opioid medications include: morphine, codeine, oxycodone, oxymorphone, hydrocodone, hydromorphone, meperidine, tramadol, tapentadol, buprenorphine, fentanyl, methadone. **Benzodiazepine medications include: diazepam (Valium), alprazolam (Xanax), clonazepam (Klonopine), lorazepam (Ativan), clorazepate (Tranxene), chlordiazepoxide (Librium), estazolam (Prosom), oxazepam (Serax), temazepam (Restoril), triazolam (Halcion) (Last updated: 04/17/2017) ____________________________________________________________________________________________    BMI Assessment: Estimated body mass index is 32.49 kg/m as calculated from the following:   Height as of this encounter: 5' 9"  (1.753 m).   Weight as of this encounter: 220 lb (99.8 kg).  BMI interpretation table: BMI level Category Range association with higher  incidence of chronic pain  <18 kg/m2 Underweight   18.5-24.9 kg/m2 Ideal body weight   25-29.9 kg/m2 Overweight Increased incidence by 20%  30-34.9 kg/m2 Obese (Class I) Increased incidence by 68%  35-39.9 kg/m2 Severe obesity (Class II) Increased incidence by 136%  >40 kg/m2 Extreme obesity (Class III) Increased incidence by 254%   BMI Readings from Last 4 Encounters:  05/27/17 32.49 kg/m  05/13/17 32.58 kg/m  02/26/17 33.23 kg/m  12/26/16 33.23 kg/m   Wt Readings from Last 4 Encounters:  05/27/17 220 lb (99.8 kg)  05/13/17 220 lb 9.6 oz (100.1 kg)  02/26/17 225 lb (102.1 kg)  12/26/16 225 lb (102.1 kg)

## 2017-05-27 NOTE — Progress Notes (Signed)
Nursing Pain Medication Assessment:  Safety precautions to be maintained throughout the outpatient stay will include: orient to surroundings, keep bed in low position, maintain call bell within reach at all times, provide assistance with transfer out of bed and ambulation.  Medication Inspection Compliance: Pill count conducted under aseptic conditions, in front of the patient. Neither the pills nor the bottle was removed from the patient's sight at any time. Once count was completed pills were immediately returned to the patient in their original bottle.  Medication: See above Pill/Patch Count: 34 of 120 pills remain Pill/Patch Appearance: Markings consistent with prescribed medication Bottle Appearance: Standard pharmacy container. Clearly labeled. Filled Date: 03 / 16 / 2019 Last Medication intake:  Today

## 2017-05-27 NOTE — Progress Notes (Signed)
Patient's Name: Kenneth Osborn  MRN: [id number]  Referring Provider: Ellene Route  DOB: Jan 28, 1956  PCP: Ellene Route  DOS: 05/27/2017  Note by: Vevelyn Francois NP  Service setting: Ambulatory outpatient  Specialty: Interventional Pain Management  Location: [facility] (AMB) Pain Management Facility    Patient type: Established    Primary Reason(s) for Visit: Encounter for prescription drug management. (Level of risk: moderate)  CC: No chief complaint on file.  HPI  Kenneth Osborn is a 62 y.o. year old, male patient, who comes today for a medication management evaluation. 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 Beverely Pace at [facility] on 01/04/2013); Long term current use of opiate analgesic; Long term prescription opiate use; Opiate use (60 MME/Day); Opiate dependence ([place]); Encounter for therapeutic drug level monitoring; Neurogenic pain; Thoracic facet syndrome (T8-10); Lumbar facet syndrome (Bilateral); Cervical facet syndrome (Right); Cervical spondylosis; Lumbar spondylosis; Chronic upper extremity pain (Left); Chronic cervical radicular pain (Left); Chronic upper back pain; History of thoracic spine surgery (S/P T9-10 IVD spacer); Failed back surgical syndrome; Chronic musculoskeletal pain; Myofascial pain; Chronic lower extremity pain (Left); Chronic radicular lumbar pain (left L4 dermatomal pain); Coronary artery disease; History of MI (myocardial infarction) (May 2012); Chronic anticoagulation (on [place]); History of Meniere's disease; Sleep apnea; Type 2 diabetes  mellitus with hyperglycemia ([place]); Dysphagia; Encounter for interrogation of neurostimulator; [employer] spinal cord stimulator (implant date: 11/01/2014); Unstable angina ([place]); Controlled type 2 diabetes mellitus without complication ([place]); Arthralgia of shoulder; CAD (coronary artery disease); Paroxysmal supraventricular tachycardia ([place]); Balance problem; Chronic tension-type headache, not intractable; Dizziness; Post-concussion headache; Mild cognitive impairment; 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); Bilateral carotid artery stenosis; Uncontrolled type 2 diabetes mellitus with hyperglycemia, with long-term current use of insulin ([place]); DM type 2 with diabetic peripheral neuropathy ([place]); Chronic pain syndrome; Numbness and tingling; Tachycardia with heart rate 100-120 beats per minute; Tremor; Meniere disease, bilateral; CKD (chronic kidney disease) stage 3, GFR 30-59 ml/min ([place]); Headache disorder; Type 2 diabetes mellitus with diabetic neuropathy ([place]); Trigger point posterior superior iliac spine (PSIS) (Right); 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 His primarily concern today is the No chief complaint on file.   Pain Assessment: Location: Lower Back Radiating: up back  Onset: More than a month ago Duration: Chronic pain Quality: Aching, Nagging, Radiating Severity: 1 /10 (self-reported pain score)  Note: Reported level is compatible with observation.                          Effect on ADL: lifting, moving Timing: Constant Modifying factors: injections, spinal stimulator battery dead  Kenneth Osborn was last scheduled for an appointment on 02/26/2017 for medication management. During today's appointment we reviewed Kenneth Osborn's chronic pain status, as well as his outpatient medication regimen. He admits that his pain is getting back to baseline . He  states [place] and [place] not able to fill his medication . He admits that when he returned home he was diagnosed with influenza and PNX.  He feels like being without thee medication contributed  to his  Illness.  HE admits that the battery died on his SCS. He feels like this my have been effective being off verses [place]..   The patient  reports that he does not use drugs. His body mass index is 32.49 kg/m.  Further details on both, my assessment(s), as well as the proposed treatment plan, please see below.  Controlled Substance Pharmacotherapy Assessment REMS (Risk Evaluation and Mitigation Strategy)  Analgesic:Oxycodone IR 10 mg every 6 hours (40 mg/day) MME/day:60 mg/day   Ignatius Specking, RN  05/27/2017  9:25 AM  Sign at close encounter Nursing Pain Medication Assessment:  Safety precautions to be maintained throughout the outpatient stay will include: orient to surroundings, keep bed in low position, maintain call bell within reach at all times, provide assistance with transfer out of bed and ambulation.  Medication Inspection Compliance: Pill count conducted under aseptic conditions, in front of the patient. Neither the pills nor the bottle was removed from the patient's sight at any time. Once count was completed pills were immediately returned to the patient in their original bottle.  Medication: See above Pill/Patch Count: 34 of 120 pills remain Pill/Patch Appearance: Markings consistent with prescribed medication Bottle Appearance: Standard pharmacy container. Clearly labeled. Filled Date: 03 / 16 / 2019 Last Medication intake:  Today   Pharmacokinetics: Liberation and absorption (onset of action): WNL Distribution (time to peak effect): WNL Metabolism and excretion (duration of action): WNL         Pharmacodynamics: Desired effects: Analgesia: Kenneth Osborn reports >50% benefit. Functional ability: Patient reports that medication allows him to accomplish basic  ADLs Clinically meaningful improvement in function (CMIF): Sustained CMIF goals met Perceived effectiveness: Described as relatively effective, allowing for increase in activities of daily living (ADL) Undesirable effects: Side-effects or Adverse reactions: None reported Monitoring: [place] PMP: Online review of the past 62-monthperiod conducted. Compliant with practice rules and regulations Last UDS on record: Summary  Date Value Ref Range Status  11/26/2016 FINAL  Final    Comment:    ==================================================================== TOXASSURE SELECT 13 (MW) ==================================================================== Test                             Result       Flag       Units Drug Present and Declared for Prescription Verification   Desmethyldiazepam              150          EXPECTED   ng/mg creat   Oxazepam                       427          EXPECTED   ng/mg creat   Temazepam                      204          EXPECTED   ng/mg creat    Desmethyldiazepam, oxazepam, and temazepam are expected    metabolites of diazepam. Desmethyldiazepam and oxazepam are also    expected metabolites of other drugs, including chlordiazepoxide,    prazepam, clorazepate, and halazepam. Oxazepam is an expected    metabolite of temazepam. Oxazepam and temazepam are also    available as scheduled prescription medications.   Oxycodone  683          EXPECTED   ng/mg creat   Oxymorphone                    1200         EXPECTED   ng/mg creat   Noroxycodone                   488          EXPECTED   ng/mg creat   Noroxymorphone                 442          EXPECTED   ng/mg creat    Sources of oxycodone are scheduled prescription medications.    Oxymorphone, noroxycodone, and noroxymorphone are expected    metabolites of oxycodone. Oxymorphone is also available as a    scheduled prescription medication. Drug Absent but Declared for Prescription Verification    Alprazolam                     Not Detected UNEXPECTED ng/mg creat ==================================================================== Test                      Result    Flag   Units      Ref Range   Creatinine              48               mg/dL      >=20 ==================================================================== Declared Medications:  The flagging and interpretation on this report are based on the  following declared medications.  Unexpected results may arise from  inaccuracies in the declared medications.  **Note: The testing scope of this panel includes these medications:  Alprazolam (Xanax)  Diazepam (Valium)  Oxycodone  **Note: The testing scope of this panel does not include following  reported medications:  Aspirin (Aspirin 81)  Cetirizine (Zyrtec)  Cyclobenzaprine (Flexeril)  Diltiazem (Cardizem)  Enalapril (Vasotec)  Ezetimibe (Zetia)  Gabapentin (Neurontin)  Hydrochlorothiazide (Maxzide)  Insulin (Humalog)  Magnesium (Mag-Ox)  Meclizine (Antivert)  Metformin (Glucophage)  Metoprolol (Toprol)  Montelukast (Singulair)  Naloxone (Narcan)  Niacin (Niaspan)  Omega-3 Fatty Acids (Fish Oil)  Omeprazole (Nexium)  Prasugrel (Effient)  Ranitidine (Zantac)  Rosuvastatin (Crestor)  Triamterene (Maxzide)  Venlafaxine (Effexor) ==================================================================== For clinical consultation, please call [phone]. ====================================================================    UDS interpretation: Compliant          Medication Assessment Form: Reviewed. Patient indicates being compliant with therapy Treatment compliance: Compliant Risk Assessment Profile: Aberrant behavior: See prior evaluations. None observed or detected today Comorbid factors increasing risk of overdose: See prior notes. No additional risks detected today Risk of substance use disorder (SUD): Low Opioid Risk Tool - 05/27/17 0923      Family  History of Substance Abuse   Alcohol  Negative    Illegal Drugs  Negative    Rx Drugs  Negative      Personal History of Substance Abuse   Alcohol  Negative    Illegal Drugs  Negative    Rx Drugs  Negative      Psychological Disease   Psychological Disease  Negative    Depression  Negative      Total Score   Opioid Risk Tool Scoring  0    Opioid Risk Interpretation  Low Risk      ORT Scoring interpretation table:  Score <3 =  Low Risk for SUD  Score between 4-7 = Moderate Risk for SUD  Score >8 = High Risk for Opioid Abuse   Risk Mitigation Strategies:  Patient Counseling: Covered Patient-Prescriber Agreement (PPA): Present and active  Notification to other healthcare providers: Done  Pharmacologic Plan: No change in therapy, at this time.             Laboratory Chemistry  Inflammation Markers (CRP: Acute Phase) (ESR: Chronic Phase) Lab Results  Component Value Date   CRP 1.4 (H) 03/22/2015   ESRSEDRATE 9 03/22/2015                         Rheumatology Markers No results found for: Elayne Guerin, [facility]                      Renal Function Markers Lab Results  Component Value Date   BUN 20 06/16/2015   CREATININE 1.24 06/16/2015   GFRAA >60 06/16/2015   GFRNONAA >60 06/16/2015                              Hepatic Function Markers Lab Results  Component Value Date   AST 27 03/22/2015   ALT 34 03/22/2015   ALBUMIN 4.1 03/22/2015   ALKPHOS 84 03/22/2015                        Electrolytes Lab Results  Component Value Date   NA 139 06/16/2015   K 4.7 06/16/2015   CL 107 06/16/2015   CALCIUM 9.5 06/16/2015   MG 1.7 03/22/2015                        Neuropathy Markers No results found for: VITAMINB12, FOLATE, HGBA1C, HIV                      Bone Pathology Markers No results found for: VD25OH, VD125OH2TOT, IR5188CZ6, SA6301SW1, 25OHVITD1, 25OHVITD2, 25OHVITD3, TESTOFREE, TESTOSTERONE                       Coagulation  Parameters Lab Results  Component Value Date   INR 1.0 02/23/2014   LABPROT 12.9 02/23/2014   APTT 25.9 08/18/2013   PLT 180 06/16/2015                        Cardiovascular Markers Lab Results  Component Value Date   CKTOTAL 155 02/23/2014   CKMB 3.3 02/23/2014   TROPONINI < 0.02 02/23/2014   HGB 13.1 06/16/2015   HCT 38.5 (L) 06/16/2015                         CA Markers No results found for: CEA, CA125, LABCA2                      Note: Lab results reviewed.  Recent Diagnostic Imaging Results  DG Lumbar Spine Complete W/Bend CLINICAL DATA:  Ionic upper lower back pain. Status post spinal stimulator placement in April 2018  EXAM: [place]  COMPARISON:  MRI of the lumbar spine of March 12, 2013  FINDINGS: Nerve stimulator electrodes enter the posterior paraspinous soft tissues at L 3 4 and extends superiorly to approximately T9 through  T12. The lumbar vertebral bodies are preserved in height. There is spina bifida occulta at L5 and S1. There is no spondylolisthesis. There are no pars defects. The disc space heights are well maintained. There is calcification in the wall of the abdominal aorta.  IMPRESSION: There is no acute bony abnormality of the lumbar spine. There is no significant degenerative disc disease and there is no spondylolisthesis.  Electronically Signed   By: David  Martinique M.D.   On: 12/26/2016 13:00 DG Thoracic Spine 2 View CLINICAL DATA:  Upper back pain, lower back pain, no injury  EXAM: THORACIC SPINE 2 VIEWS  COMPARISON:  Chest x-ray of 08/18/2013  FINDINGS: The thoracic vertebrae are in normal alignment. Intervertebral disc spaces appear normal. Interbody fusion plug is Breath and T8-9. Neurostimulator electrode leads overlie the mid lower thoracic spinal canal. No compression deformity is seen. No prominent paravertebral soft tissue is noted. Lower anterior cervical spine fusion plate is present  which appears to be at C6-7  IMPRESSION: No acute abnormality.  Normal alignment.  Electronically Signed   By: Ivar Drape M.D.   On: 12/26/2016 13:00  Complexity Note: Imaging results reviewed. Results shared with Mr. Bonn, using Layman's terms.                         Meds   Current Outpatient Medications:  .  ACCU-CHEK AVIVA PLUS test strip, , Disp: , Rfl: 2 .  ALPRAZolam (XANAX) 0.5 MG tablet, Take 0.5 mg by mouth at bedtime as needed (Meniere's disease). , Disp: , Rfl:  .  aspirin EC 81 MG tablet, Take 81 mg by mouth at bedtime. , Disp: , Rfl:  .  cetirizine (ZYRTEC) 10 MG tablet, Take 10 mg by mouth daily., Disp: , Rfl:  .  cyclobenzaprine (FLEXERIL) 10 MG tablet, Take 1 tablet (10 mg total) by mouth every 8 (eight) hours as needed for muscle spasms., Disp: 270 tablet, Rfl: 1 .  diazepam (VALIUM) 5 MG tablet, Take 5 mg by mouth 3 (three) times daily as needed (Meniere's disease). , Disp: , Rfl:  .  diltiazem (CARTIA XT) 240 MG 24 hr capsule, Take 240 mg by mouth at bedtime., Disp: , Rfl:  .  docusate sodium (COLACE) 100 MG capsule, Take 100 mg by mouth daily., Disp: , Rfl:  .  enalapril (VASOTEC) 10 MG tablet, Take 10 mg by mouth 2 (two) times daily., Disp: , Rfl:  .  esomeprazole (NEXIUM) 40 MG capsule, Take 40 mg by mouth 2 (two) times daily., Disp: , Rfl:  .  ezetimibe (ZETIA) 10 MG tablet, Take 10 mg by mouth daily., Disp: , Rfl:  .  gabapentin (NEURONTIN) 800 MG tablet, Take 2 tablets (1,600 mg total) by mouth 2 (two) times daily., Disp: 360 tablet, Rfl: 1 .  HUMULIN R U-500 KWIKPEN 500 UNIT/ML kwikpen, 65 units before breakfast and lunch; 55 units before supper, Disp: , Rfl: 0 .  magnesium oxide (MAG-OX) 400 MG tablet, Take 400 mg by mouth daily. , Disp: , Rfl:  .  meclizine (ANTIVERT) 25 MG tablet, Take 25 mg by mouth 3 (three) times daily as needed for dizziness. , Disp: , Rfl:  .  metFORMIN (GLUCOPHAGE) 1000 MG tablet, Take 1,000 mg by mouth 2 (two) times daily with a  meal., Disp: , Rfl:  .  methylPREDNISolone (MEDROL DOSEPAK) 4 MG TBPK tablet, PRN for sinus, Disp: , Rfl:  .  metoprolol succinate (TOPROL-XL) 50 MG 24 hr tablet,  Take 50 mg by mouth 2 (two) times daily. Take with or immediately following a meal. , Disp: , Rfl:  .  montelukast (SINGULAIR) 10 MG tablet, Take by mouth daily. , Disp: , Rfl:  .  naloxone (NARCAN) 2 MG/2ML injection, Inject content of syringe into thigh muscle. Call 911., Disp: 2 Syringe, Rfl: 1 .  niacin (NIASPAN) 500 MG CR tablet, Take 500 mg by mouth at bedtime., Disp: , Rfl:  .  Omega-3 Fatty Acids (FISH OIL) 1000 MG CAPS, Take 1,200 mg by mouth 2 (two) times daily. , Disp: , Rfl:  .  prasugrel (EFFIENT) 10 MG TABS, Take 10 mg by mouth daily., Disp: , Rfl:  .  ranitidine (ZANTAC) 150 MG tablet, Take 150 mg by mouth at bedtime. , Disp: , Rfl:  .  rosuvastatin (CRESTOR) 20 MG tablet, Take 20 mg by mouth at bedtime., Disp: , Rfl:  .  triamterene-hydrochlorothiazide (MAXZIDE) 75-50 MG tablet, Take 1 tablet by mouth daily. , Disp: , Rfl:  .  [START ON 08/31/2017] Oxycodone HCl 10 MG TABS, Take 1 tablet (10 mg total) by mouth every 6 (six) hours as needed., Disp: 120 tablet, Rfl: 0 .  [START ON 08/01/2017] Oxycodone HCl 10 MG TABS, Take 1 tablet (10 mg total) by mouth every 6 (six) hours as needed., Disp: 120 tablet, Rfl: 0 .  [START ON 07/02/2017] Oxycodone HCl 10 MG TABS, Take 1 tablet (10 mg total) by mouth every 6 (six) hours as needed., Disp: 120 tablet, Rfl: 0  ROS  Constitutional: Denies any fever or chills Gastrointestinal: No reported hemesis, hematochezia, vomiting, or acute GI distress Musculoskeletal: Denies any acute onset joint swelling, redness, loss of ROM, or weakness Neurological: No reported episodes of acute onset apraxia, aphasia, dysarthria, agnosia, amnesia, paralysis, loss of coordination, or loss of consciousness  Allergies  Kenneth Osborn has No Known Allergies.  PFSH  Drug: Kenneth Osborn  reports that he does not use  drugs. Alcohol:  reports that he does not drink alcohol. Tobacco:  reports that he has never smoked. He has never used smokeless tobacco. Medical:  has a past medical history of Allergic rhinitis (12/30/2012), Bronchitis, Can't get food down (08/12/2014), Chronic back pain, Concussion (09/2015), COPD (chronic obstructive pulmonary disease) ([place]), Coronary artery disease, DDD (degenerative disc disease), cervical, Dehydration symptoms, Diabetes mellitus without complication ([place]), Dysphagia, GERD (gastroesophageal reflux disease), History of Meniere's disease (12/21/2014), History of thoracic spine surgery (S/P T9-10 IVD spacer) (12/21/2014), Hypercholesteremia, Hyperlipidemia, Hypertension, Meniere's disease, Myocardial infarction ([place]), Neuromuscular disorder ([place]), Psychosis ([place]), Short-segment Barrett's esophagus, and Sleep apnea. Surgical: Kenneth Osborn  has a past surgical history that includes Cardiovascular stress test; Cardiac catheterization; Coronary angioplasty; Labrinthectomy; mastoid shunt; Appendectomy; Back surgery; Shoulder arthroscopy with subacromial decompression (Left, 04/06/2012); ARTHRODESIS ANTERIOR ANTERIOR CERVICLE SPINE (01/04/2013); Colonoscopy with propofol (N/A, 09/19/2014); Esophagogastroduodenoscopy (N/A, 09/19/2014); Savory dilation (N/A, 09/19/2014); Spinal cord stimulator implant (Right); Cardiac catheterization (N/A, 06/15/2015); and Cardiac catheterization (N/A, 06/15/2015). Family: family history includes Cancer in his sister; Diabetes in his maternal grandmother, mother, and paternal grandmother; Heart disease in his father, maternal aunt, maternal uncle, and mother.  Constitutional Exam  General appearance: Well nourished, well developed, and well hydrated. In no apparent acute distress Vitals:   05/27/17 0913  BP: 111/70  Pulse: 78  Resp: 16  Temp: 98 F (36.7 C)  SpO2: 98%  Weight: 220 lb (99.8 kg)  Height: 5' 9"  (1.753 m)  Psych/Mental status: Alert, oriented x 3  (person, place, & time)  Eyes: PERLA Respiratory: No evidence of acute respiratory distress  Cervical Spine Area Exam  Skin & Axial Inspection: No masses, redness, edema, swelling, or associated skin lesions Alignment: Symmetrical Functional ROM: Unrestricted ROM      Stability: No instability detected Muscle Tone/Strength: Functionally intact. No obvious neuro-muscular anomalies detected. Sensory (Neurological): Unimpaired Palpation: No palpable anomalies              Upper Extremity (UE) Exam    Side: Right upper extremity  Side: Left upper extremity  Skin & Extremity Inspection: Skin color, temperature, and hair growth are WNL. No peripheral edema or cyanosis. No masses, redness, swelling, asymmetry, or associated skin lesions. No contractures.  Skin & Extremity Inspection: Skin color, temperature, and hair growth are WNL. No peripheral edema or cyanosis. No masses, redness, swelling, asymmetry, or associated skin lesions. No contractures.  Functional ROM: Unrestricted ROM          Functional ROM: Unrestricted ROM          Muscle Tone/Strength: Functionally intact. No obvious neuro-muscular anomalies detected.  Muscle Tone/Strength: Functionally intact. No obvious neuro-muscular anomalies detected.  Sensory (Neurological): Unimpaired          Sensory (Neurological): Unimpaired          Palpation: No palpable anomalies              Palpation: No palpable anomalies              Specialized Test(s): Deferred         Specialized Test(s): Deferred          Thoracic Spine Area Exam  Skin & Axial Inspection: No masses, redness, or swelling Alignment: Symmetrical Functional ROM: Unrestricted ROM Stability: No instability detected Muscle Tone/Strength: Functionally intact. No obvious neuro-muscular anomalies detected. Sensory (Neurological): Unimpaired Muscle strength & Tone: No palpable anomalies  Lumbar Spine Area Exam  Skin & Axial Inspection: No masses, redness, or swelling  SCS Alignment: Symmetrical Functional ROM: Unrestricted ROM      Stability: No instability detected Muscle Tone/Strength: Functionally intact. No obvious neuro-muscular anomalies detected. Sensory (Neurological): Unimpaired Palpation: Non-tender       Provocative Tests: Lumbar Hyperextension and rotation test: evaluation deferred today       Lumbar Lateral bending test: evaluation deferred today       Patrick's Maneuver: evaluation deferred today                    Gait & Posture Assessment  Ambulation: Unassisted Gait: Relatively normal for age and body habitus Posture: WNL   Lower Extremity Exam    Side: Right lower extremity  Side: Left lower extremity  Skin & Extremity Inspection: Skin color, temperature, and hair growth are WNL. No peripheral edema or cyanosis. No masses, redness, swelling, asymmetry, or associated skin lesions. No contractures.  Skin & Extremity Inspection: Skin color, temperature, and hair growth are WNL. No peripheral edema or cyanosis. No masses, redness, swelling, asymmetry, or associated skin lesions. No contractures.  Functional ROM: Unrestricted ROM          Functional ROM: Unrestricted ROM          Muscle Tone/Strength: Functionally intact. No obvious neuro-muscular anomalies detected.  Muscle Tone/Strength: Functionally intact. No obvious neuro-muscular anomalies detected.  Sensory (Neurological): Unimpaired  Sensory (Neurological): Unimpaired  Palpation: No palpable anomalies  Palpation: No palpable anomalies   Assessment  Primary Diagnosis & Pertinent Problem List: The primary encounter diagnosis was Lumbar spondylosis. Diagnoses of Osteoarthritis of cervical  spine, unspecified spinal osteoarthritis complication status, Chronic upper back pain, Musculoskeletal pain, Chronic pain syndrome, Neuropathic pain, and Long term prescription opiate use were also pertinent to this visit.  Status Diagnosis  Controlled Controlled Controlled 1. Lumbar spondylosis    2. Osteoarthritis of cervical spine, unspecified spinal osteoarthritis complication status   3. Chronic upper back pain   4. Musculoskeletal pain   5. Chronic pain syndrome   6. Neuropathic pain   7. Long term prescription opiate use     Problems updated and reviewed during this visit: Problem  Polyneuropathy  Gerd (Gastroesophageal Reflux Disease)   Plan of Care  Pharmacotherapy (Medications Ordered): Meds ordered this encounter  Medications  . cyclobenzaprine (FLEXERIL) 10 MG tablet    Sig: Take 1 tablet (10 mg total) by mouth every 8 (eight) hours as needed for muscle spasms.    Dispense:  270 tablet    Refill:  1    3 Month refill. Do not add this medication to the electronic "Automatic Refill" notification system. Patient may have prescription filled one day early if pharmacy is closed on scheduled refill date.    Order Specific Question:   Supervising Provider    Answer:   Milinda Pointer [phone]  . gabapentin (NEURONTIN) 800 MG tablet    Sig: Take 2 tablets (1,600 mg total) by mouth 2 (two) times daily.    Dispense:  360 tablet    Refill:  1    Do not place this medication, or any other prescription from our practice, on "Automatic Refill". Patient may have prescription filled one day early if pharmacy is closed on scheduled refill date. (3 mo refill)    Order Specific Question:   Supervising Provider    Answer:   Milinda Pointer [phone]  . Oxycodone HCl 10 MG TABS    Sig: Take 1 tablet (10 mg total) by mouth every 6 (six) hours as needed.    Dispense:  120 tablet    Refill:  0    Do not place this medication, or any other prescription from our practice, on "Automatic Refill". Patient may have prescription filled one day early if pharmacy is closed on scheduled refill date. Do not fill until:08/31/2017 To last until: 09/30/2017    Order Specific Question:   Supervising Provider    Answer:   Milinda Pointer [phone]  . Oxycodone HCl 10 MG TABS    Sig: Take 1  tablet (10 mg total) by mouth every 6 (six) hours as needed.    Dispense:  120 tablet    Refill:  0    Do not place this medication, or any other prescription from our practice, on "Automatic Refill". Patient may have prescription filled one day early if pharmacy is closed on scheduled refill date. Do not fill until: 08/01/2017 To last until:08/31/2017    Order Specific Question:   Supervising Provider    Answer:   Milinda Pointer [phone]  . Oxycodone HCl 10 MG TABS    Sig: Take 1 tablet (10 mg total) by mouth every 6 (six) hours as needed.    Dispense:  120 tablet    Refill:  0    Do not place this medication, or any other prescription from our practice, on "Automatic Refill". Patient may have prescription filled one day early if pharmacy is closed on scheduled refill date. Do not fill until:07/02/2017 To last until: 08/01/2017    Order Specific Question:   Supervising Provider    Answer:   Milinda Pointer [  [id number]]   New Prescriptions   No medications on file   Medications administered today: [name] had no medications administered during this visit. Lab-work, procedure(s), and/or referral(s): Orders Placed This Encounter  Procedures  . ToxASSURE Select 13 (MW), Urine   Imaging and/or referral(s): None Interventional therapies: Planned, scheduled, and/or pending: None at this time.He will call for procedure in the future   Considering: None at this time.   Palliative PRN treatment(s): Right side posterior superior iliac spine Trigger PointInjection       Interventional therapies: Planned, scheduled, and/or pending: None at this time.He will call for procedure in the future   Considering: None at this time.   Palliative PRN treatment(s): Right side posterior superior iliac spine Trigger PointInjection     Provider-requested follow-up: Return in about 4 months (around 09/16/2017) for MedMgmt with Me Donella Stade Edison Pace).  Future  Appointments  Date Time Provider [place]  06/10/2017 11:30 AM Georgina Peer, RD [facility] None  09/11/2017  9:00 AM Vevelyn Francois, NP [facility] None   Primary Care Physician: Ellene Route Location: [facility] Out[facility] Facility Note by: Vevelyn Francois NP Date: 05/27/2017; Time: 1:04 PM  Pain Score Disclaimer: We use the NRS-11 scale. This is a self-reported, subjective measurement of pain severity with only modest accuracy. It is used primarily to identify changes within a particular patient. It must be understood that outpatient pain scales are significantly less accurate that those used for research, where they can be applied under ideal controlled circumstances with minimal exposure to variables. In reality, the score is likely to be a combination of pain intensity and pain affect, where pain affect describes the degree of emotional arousal or changes in action readiness caused by the sensory experience of pain. Factors such as social and work situation, setting, emotional state, anxiety levels, expectation, and prior pain experience may influence pain perception and show large inter-individual differences that may also be affected by time variables.  Patient instructions provided during this appointment: Patient Instructions   ____________________________________________________________________________________________  Medication Rules  Applies to: All patients receiving prescriptions (written or electronic).  Pharmacy of record: Pharmacy where electronic prescriptions will be sent. If written prescriptions are taken to a different pharmacy, please inform the nursing staff. The pharmacy listed in the electronic medical record should be the one where you would like electronic prescriptions to be sent.  Prescription refills: Only during scheduled appointments. Applies to both, written and electronic prescriptions.  NOTE: The following applies primarily to  controlled substances (Opioid* Pain Medications).   Patient's responsibilities: 1. Pain Pills: Bring all pain pills to every appointment (except for procedure appointments). 2. Pill Bottles: Bring pills in original pharmacy bottle. Always bring newest bottle. Bring bottle, even if empty. 3. Medication refills: You are responsible for knowing and keeping track of what medications you need refilled. The day before your appointment, write a list of all prescriptions that need to be refilled. Bring that list to your appointment and give it to the admitting nurse. Prescriptions will be written only during appointments. If you forget a medication, it will not be "Called in", "Faxed", or "electronically sent". You will need to get another appointment to get these prescribed. 4. Prescription Accuracy: You are responsible for carefully inspecting your prescriptions before leaving our office. Have the discharge nurse carefully go over each prescription with you, before taking them home. Make sure that your name is accurately spelled, that your address is correct. Check the name and dose of  your medication to make sure it is accurate. Check the number of pills, and the written instructions to make sure they are clear and accurate. Make sure that you are given enough medication to last until your next medication refill appointment. 5. Taking Medication: Take medication as prescribed. Never take more pills than instructed. Never take medication more frequently than prescribed. Taking less pills or less frequently is permitted and encouraged, when it comes to controlled substances (written prescriptions).  6. Inform other Doctors: Always inform, all of your healthcare providers, of all the medications you take. 7. Pain Medication from other Providers: You are not allowed to accept any additional pain medication from any other Doctor or Healthcare provider. There are two exceptions to this rule. (see below) In the event  that you require additional pain medication, you are responsible for notifying us, as stated below. 8. Medication Agreement: You are responsible for carefully reading and following our Medication Agreement. This must be signed before receiving any prescriptions from our practice. Safely store a copy of your signed Agreement. Violations to the Agreement will result in no further prescriptions. (Additional copies of our Medication Agreement are available upon request.) 9. Laws, Rules, & Regulations: All patients are expected to follow all Federal and [employer], [employer], Rules, [employer]. Ignorance of the Laws does not constitute a valid excuse. The use of any illegal substances is prohibited. 10. Adopted CDC guidelines & recommendations: Target dosing levels will be at or below 60 MME/day. Use of benzodiazepines** is not recommended.  Exceptions: There are only two exceptions to the rule of not receiving pain medications from other Healthcare Providers. 1. Exception #1 (Emergencies): In the event of an emergency (i.e.: accident requiring emergency care), you are allowed to receive additional pain medication. However, you are responsible for: As soon as you are able, call our office [phone], at any time of the day or night, and leave a message stating your name, the date and nature of the emergency, and the name and dose of the medication prescribed. In the event that your call is answered by a member of our staff, make sure to document and save the date, time, and the name of the person that took your information.  2. Exception #2 (Planned Surgery): In the event that you are scheduled by another doctor or dentist to have any type of surgery or procedure, you are allowed (for a period no longer than 30 days), to receive additional pain medication, for the acute post-op pain. However, in this case, you are responsible for picking up a copy of our "Post-op Pain Management for Surgeons" handout, and  giving it to your surgeon or dentist. This document is available at our office, and does not require an appointment to obtain it. Simply go to our office during business hours (Monday-Thursday from 8:00 AM to 4:00 PM) (Friday 8:00 AM to 12:00 Noon) or if you have a scheduled appointment with [name], prior to your surgery, and ask for it by name. In addition, you will need to provide [name] with your name, name of your surgeon, type of surgery, and date of procedure or surgery.  *Opioid medications include: morphine, codeine, oxycodone, oxymorphone, hydrocodone, hydromorphone, meperidine, tramadol, tapentadol, buprenorphine, fentanyl, methadone. **Benzodiazepine medications include: diazepam (Valium), alprazolam (Xanax), clonazepam (Klonopine), lorazepam (Ativan), clorazepate (Tranxene), chlordiazepoxide (Librium), estazolam (Prosom), oxazepam (Serax), temazepam (Restoril), triazolam (Halcion) (Last updated: 04/17/2017) ____________________________________________________________________________________________    BMI Assessment: Estimated body mass index is 32.49 kg/m as calculated from the following:  Height as of this encounter: 5' 9"  (1.753 m).   Weight as of this encounter: 220 lb (99.8 kg).  BMI interpretation table: BMI level Category Range association with higher incidence of chronic pain  <18 kg/m2 Underweight   18.5-24.9 kg/m2 Ideal body weight   25-29.9 kg/m2 Overweight Increased incidence by 20%  30-34.9 kg/m2 Obese (Class I) Increased incidence by 68%  35-39.9 kg/m2 Severe obesity (Class II) Increased incidence by 136%  >40 kg/m2 Extreme obesity (Class III) Increased incidence by 254%   BMI Readings from Last 4 Encounters:  05/27/17 32.49 kg/m  05/13/17 32.58 kg/m  02/26/17 33.23 kg/m  12/26/16 33.23 kg/m   Wt Readings from Last 4 Encounters:  05/27/17 220 lb (99.8 kg)  05/13/17 220 lb 9.6 oz (100.1 kg)  02/26/17 225 lb (102.1 kg)  12/26/16 225 lb (102.1 kg)

## 2017-05-31 LAB — TOXASSURE SELECT 13 (MW), URINE

## 2017-06-10 ENCOUNTER — Encounter: Payer: Self-pay | Admitting: Dietician

## 2017-06-10 ENCOUNTER — Encounter: Payer: Medicare Other | Attending: Endocrinology | Admitting: Dietician

## 2017-06-10 VITALS — BP 96/62 | Ht 69.0 in | Wt 222.1 lb

## 2017-06-10 DIAGNOSIS — Z794 Long term (current) use of insulin: Secondary | ICD-10-CM | POA: Diagnosis not present

## 2017-06-10 DIAGNOSIS — Z713 Dietary counseling and surveillance: Secondary | ICD-10-CM | POA: Diagnosis present

## 2017-06-10 DIAGNOSIS — E119 Type 2 diabetes mellitus without complications: Secondary | ICD-10-CM | POA: Diagnosis not present

## 2017-06-10 NOTE — Patient Instructions (Signed)
   Continue to control carbohydrate intake at meals, great job!  Include some extra nuts, or a boiled egg, when you have cereal for breakfast.  Add some water exercises as able. Make sure to have a snack on hand as blood sugar might drop low more easily.

## 2017-06-10 NOTE — Progress Notes (Signed)
Diabetes Self-Management Education  Visit Type:  Follow-up  Appt. Start Time: 1125 Appt. End Time: 1225  06/10/2017  [name], identified by name and date of birth, is a 62 y.o. male with a diagnosis of Diabetes:  .   ASSESSMENT  Blood pressure 96/62, height 5' 9"  (1.753 m), weight 222 lb 1.6 oz (100.7 kg). Body mass index is 32.8 kg/m.   Diabetes Self-Management Education - 75/91/63 8466      Complications   How often do you check your blood sugar?  3-4 times/day    Fasting Blood glucose range (mg/dL)  180-200;70-129;130-179    Number of hypoglycemic episodes per month  6    Can you tell when your blood sugar is low?  Yes    What do you do if your blood sugar is low?  drinks juice    Have you had a dilated eye exam in the past 12 months?  Yes    Have you had a dental exam in the past 12 months?  Yes on wait list for dental appt    Are you checking your feet?  Yes    How many days per week are you checking your feet?  7      Dietary Intake   Breakfast  3 meals and 1-2 snacks daily      Exercise   Exercise Type  ADL's plans to start water exercise      Patient Education   Nutrition management   Role of diet in the treatment of diabetes and the relationship between the three main macronutrients and blood glucose level;Food label reading, portion sizes and measuring food.;Meal timing in regards to the patients' current diabetes medication.;Meal options for control of blood glucose level and chronic complications.    Physical activity and exercise   Role of exercise on diabetes management, blood pressure control and cardiac health.    Monitoring  Taught/discussed recording of test results and interpretation of SMBG.      Post-Education Assessment   Patient understands the diabetes disease and treatment process.  Demonstrates understanding / competency    Patient understands incorporating nutritional management into lifestyle.  Demonstrates understanding / competency    Patient undertands incorporating physical activity into lifestyle.  Demonstrates understanding / competency    Patient understands using medications safely.  Demonstrates understanding / competency    Patient understands monitoring blood glucose, interpreting and using results  Demonstrates understanding / competency    Patient understands prevention, detection, and treatment of acute complications.  Demonstrates understanding / competency    Patient understands prevention, detection, and treatment of chronic complications.  Demonstrates understanding / competency    Patient understands how to develop strategies to address psychosocial issues.  Demonstrates understanding / competency    Patient understands how to develop strategies to promote health/change behavior.  Demonstrates understanding / competency      Outcomes   Program Status  Completed       Learning Objective:  Patient will have a greater understanding of diabetes self-management. Patient education plan is to attend individual and/or group sessions per assessed needs and concerns.  [name] is generally eating balanced meals and controlling amounts of carbohydrate. He feels he is doing well. He does plan to begin water exercise within the next several weeks.    Plan:   Patient Instructions   Continue to control carbohydrate intake at meals, great job!  Include some extra nuts, or a boiled egg, when you have cereal for breakfast.  Add some water exercises as able. Make sure to have a snack on hand as blood sugar might drop low more easily.     Expected Outcomes:  Demonstrated interest in learning. Expect positive outcomes  Education material provided: Quick and Easy Meal Ideas; Diabetes-Friendly Recipes and Menus  If problems or questions, patient to contact team via:  Phone and Email

## 2017-09-03 DIAGNOSIS — G8929 Other chronic pain: Secondary | ICD-10-CM | POA: Insufficient documentation

## 2017-09-03 DIAGNOSIS — Z8679 Personal history of other diseases of the circulatory system: Secondary | ICD-10-CM | POA: Insufficient documentation

## 2017-09-03 DIAGNOSIS — M549 Dorsalgia, unspecified: Secondary | ICD-10-CM

## 2017-09-08 DIAGNOSIS — R55 Syncope and collapse: Secondary | ICD-10-CM | POA: Insufficient documentation

## 2017-09-11 ENCOUNTER — Encounter: Payer: Self-pay | Admitting: Nurse Practitioner

## 2017-09-11 ENCOUNTER — Ambulatory Visit: Payer: Medicare Other | Attending: Nurse Practitioner | Admitting: Nurse Practitioner

## 2017-09-11 ENCOUNTER — Other Ambulatory Visit: Payer: Self-pay

## 2017-09-11 VITALS — BP 120/83 | HR 92 | Temp 98.2°F | Resp 16 | Ht 69.0 in | Wt 228.0 lb

## 2017-09-11 DIAGNOSIS — Z7982 Long term (current) use of aspirin: Secondary | ICD-10-CM | POA: Insufficient documentation

## 2017-09-11 DIAGNOSIS — I2510 Atherosclerotic heart disease of native coronary artery without angina pectoris: Secondary | ICD-10-CM | POA: Diagnosis not present

## 2017-09-11 DIAGNOSIS — Z79899 Other long term (current) drug therapy: Secondary | ICD-10-CM | POA: Insufficient documentation

## 2017-09-11 DIAGNOSIS — Z5181 Encounter for therapeutic drug level monitoring: Secondary | ICD-10-CM | POA: Diagnosis not present

## 2017-09-11 DIAGNOSIS — G894 Chronic pain syndrome: Secondary | ICD-10-CM | POA: Insufficient documentation

## 2017-09-11 DIAGNOSIS — M47816 Spondylosis without myelopathy or radiculopathy, lumbar region: Secondary | ICD-10-CM | POA: Diagnosis not present

## 2017-09-11 DIAGNOSIS — E1149 Type 2 diabetes mellitus with other diabetic neurological complication: Secondary | ICD-10-CM | POA: Insufficient documentation

## 2017-09-11 DIAGNOSIS — H8103 Meniere's disease, bilateral: Secondary | ICD-10-CM | POA: Insufficient documentation

## 2017-09-11 DIAGNOSIS — E1165 Type 2 diabetes mellitus with hyperglycemia: Secondary | ICD-10-CM | POA: Diagnosis not present

## 2017-09-11 DIAGNOSIS — M47892 Other spondylosis, cervical region: Secondary | ICD-10-CM | POA: Insufficient documentation

## 2017-09-11 DIAGNOSIS — J449 Chronic obstructive pulmonary disease, unspecified: Secondary | ICD-10-CM | POA: Insufficient documentation

## 2017-09-11 DIAGNOSIS — K2270 Barrett's esophagus without dysplasia: Secondary | ICD-10-CM | POA: Insufficient documentation

## 2017-09-11 DIAGNOSIS — I129 Hypertensive chronic kidney disease with stage 1 through stage 4 chronic kidney disease, or unspecified chronic kidney disease: Secondary | ICD-10-CM | POA: Insufficient documentation

## 2017-09-11 DIAGNOSIS — M7918 Myalgia, other site: Secondary | ICD-10-CM | POA: Insufficient documentation

## 2017-09-11 DIAGNOSIS — M5030 Other cervical disc degeneration, unspecified cervical region: Secondary | ICD-10-CM | POA: Insufficient documentation

## 2017-09-11 DIAGNOSIS — Z8249 Family history of ischemic heart disease and other diseases of the circulatory system: Secondary | ICD-10-CM | POA: Insufficient documentation

## 2017-09-11 DIAGNOSIS — M79605 Pain in left leg: Secondary | ICD-10-CM | POA: Diagnosis not present

## 2017-09-11 DIAGNOSIS — M792 Neuralgia and neuritis, unspecified: Secondary | ICD-10-CM

## 2017-09-11 DIAGNOSIS — M546 Pain in thoracic spine: Secondary | ICD-10-CM | POA: Diagnosis not present

## 2017-09-11 DIAGNOSIS — Z833 Family history of diabetes mellitus: Secondary | ICD-10-CM | POA: Insufficient documentation

## 2017-09-11 DIAGNOSIS — M4722 Other spondylosis with radiculopathy, cervical region: Secondary | ICD-10-CM | POA: Insufficient documentation

## 2017-09-11 DIAGNOSIS — N183 Chronic kidney disease, stage 3 (moderate): Secondary | ICD-10-CM | POA: Insufficient documentation

## 2017-09-11 DIAGNOSIS — M47896 Other spondylosis, lumbar region: Secondary | ICD-10-CM | POA: Diagnosis not present

## 2017-09-11 DIAGNOSIS — K219 Gastro-esophageal reflux disease without esophagitis: Secondary | ICD-10-CM | POA: Insufficient documentation

## 2017-09-11 DIAGNOSIS — E1122 Type 2 diabetes mellitus with diabetic chronic kidney disease: Secondary | ICD-10-CM | POA: Insufficient documentation

## 2017-09-11 DIAGNOSIS — M25519 Pain in unspecified shoulder: Secondary | ICD-10-CM | POA: Diagnosis not present

## 2017-09-11 DIAGNOSIS — Z7984 Long term (current) use of oral hypoglycemic drugs: Secondary | ICD-10-CM | POA: Insufficient documentation

## 2017-09-11 DIAGNOSIS — M79602 Pain in left arm: Secondary | ICD-10-CM | POA: Diagnosis not present

## 2017-09-11 DIAGNOSIS — M545 Low back pain: Secondary | ICD-10-CM | POA: Insufficient documentation

## 2017-09-11 DIAGNOSIS — E0842 Diabetes mellitus due to underlying condition with diabetic polyneuropathy: Secondary | ICD-10-CM | POA: Diagnosis not present

## 2017-09-11 DIAGNOSIS — E785 Hyperlipidemia, unspecified: Secondary | ICD-10-CM | POA: Insufficient documentation

## 2017-09-11 DIAGNOSIS — G4733 Obstructive sleep apnea (adult) (pediatric): Secondary | ICD-10-CM | POA: Insufficient documentation

## 2017-09-11 DIAGNOSIS — Z79891 Long term (current) use of opiate analgesic: Secondary | ICD-10-CM | POA: Diagnosis not present

## 2017-09-11 DIAGNOSIS — M47812 Spondylosis without myelopathy or radiculopathy, cervical region: Secondary | ICD-10-CM | POA: Diagnosis not present

## 2017-09-11 DIAGNOSIS — I252 Old myocardial infarction: Secondary | ICD-10-CM | POA: Insufficient documentation

## 2017-09-11 DIAGNOSIS — E1142 Type 2 diabetes mellitus with diabetic polyneuropathy: Secondary | ICD-10-CM | POA: Diagnosis not present

## 2017-09-11 MED ORDER — OXYCODONE HCL 10 MG PO TABS: 10.0000 mg | ORAL_TABLET | Freq: Four times a day (QID) | ORAL | 0 refills | Status: DC | PRN

## 2017-09-11 MED ORDER — CYCLOBENZAPRINE HCL 10 MG PO TABS: 10.0000 mg | ORAL_TABLET | Freq: Three times a day (TID) | ORAL | 1 refills | Status: DC | PRN

## 2017-09-11 MED ORDER — NALOXONE HCL 2 MG/2ML IJ SOSY: PREFILLED_SYRINGE | INTRAMUSCULAR | 1 refills | Status: DC

## 2017-09-11 MED ORDER — GABAPENTIN 800 MG PO TABS: 1600.0000 mg | ORAL_TABLET | Freq: Two times a day (BID) | ORAL | 1 refills | Status: DC

## 2017-09-11 NOTE — Patient Instructions (Addendum)
____________________________________________________________________________________________  Medication Rules  Applies to: All patients receiving prescriptions (written or electronic).  Pharmacy of record: Pharmacy where electronic prescriptions will be sent. If written prescriptions are taken to a different pharmacy, please inform the nursing staff. The pharmacy listed in the electronic medical record should be the one where you would like electronic prescriptions to be sent.  Prescription refills: Only during scheduled appointments. Applies to both, written and electronic prescriptions.  NOTE: The following applies primarily to controlled substances (Opioid* Pain Medications).   Patient's responsibilities: 1. Pain Pills: Bring all pain pills to every appointment (except for procedure appointments). 2. Pill Bottles: Bring pills in original pharmacy bottle. Always bring newest bottle. Bring bottle, even if empty. 3. Medication refills: You are responsible for knowing and keeping track of what medications you need refilled. The day before your appointment, write a list of all prescriptions that need to be refilled. Bring that list to your appointment and give it to the admitting nurse. Prescriptions will be written only during appointments. If you forget a medication, it will not be "Called in", "Faxed", or "electronically sent". You will need to get another appointment to get these prescribed. 4. Prescription Accuracy: You are responsible for carefully inspecting your prescriptions before leaving our office. Have the discharge nurse carefully go over each prescription with you, before taking them home. Make sure that your name is accurately spelled, that your address is correct. Check the name and dose of your medication to make sure it is accurate. Check the number of pills, and the written instructions to make sure they are clear and accurate. Make sure that you are given enough medication to last  until your next medication refill appointment. 5. Taking Medication: Take medication as prescribed. Never take more pills than instructed. Never take medication more frequently than prescribed. Taking less pills or less frequently is permitted and encouraged, when it comes to controlled substances (written prescriptions).  6. Inform other Doctors: Always inform, all of your healthcare providers, of all the medications you take. 7. Pain Medication from other Providers: You are not allowed to accept any additional pain medication from any other Doctor or Healthcare provider. There are two exceptions to this rule. (see below) In the event that you require additional pain medication, you are responsible for notifying us, as stated below. 8. Medication Agreement: You are responsible for carefully reading and following our Medication Agreement. This must be signed before receiving any prescriptions from our practice. Safely store a copy of your signed Agreement. Violations to the Agreement will result in no further prescriptions. (Additional copies of our Medication Agreement are available upon request.) 9. Laws, Rules, & Regulations: All patients are expected to follow all Federal and [employer], [employer], Rules, [employer]. Ignorance of the Laws does not constitute a valid excuse. The use of any illegal substances is prohibited. 10. Adopted CDC guidelines & recommendations: Target dosing levels will be at or below 60 MME/day. Use of benzodiazepines** is not recommended.  Exceptions: There are only two exceptions to the rule of not receiving pain medications from other Healthcare Providers. 1. Exception #1 (Emergencies): In the event of an emergency (i.e.: accident requiring emergency care), you are allowed to receive additional pain medication. However, you are responsible for: As soon as you are able, call our office [phone], at any time of the day or night, and leave a message stating your name, the  date and nature of the emergency, and the name and dose of the medication  prescribed. In the event that your call is answered by a member of our staff, make sure to document and save the date, time, and the name of the person that took your information.  2. Exception #2 (Planned Surgery): In the event that you are scheduled by another doctor or dentist to have any type of surgery or procedure, you are allowed (for a period no longer than 30 days), to receive additional pain medication, for the acute post-op pain. However, in this case, you are responsible for picking up a copy of our "Post-op Pain Management for Surgeons" handout, and giving it to your surgeon or dentist. This document is available at our office, and does not require an appointment to obtain it. Simply go to our office during business hours (Monday-Thursday from 8:00 AM to 4:00 PM) (Friday 8:00 AM to 12:00 Noon) or if you have a scheduled appointment with [name], prior to your surgery, and ask for it by name. In addition, you will need to provide [name] with your name, name of your surgeon, type of surgery, and date of procedure or surgery.  *Opioid medications include: morphine, codeine, oxycodone, oxymorphone, hydrocodone, hydromorphone, meperidine, tramadol, tapentadol, buprenorphine, fentanyl, methadone. **Benzodiazepine medications include: diazepam (Valium), alprazolam (Xanax), clonazepam (Klonopine), lorazepam (Ativan), clorazepate (Tranxene), chlordiazepoxide (Librium), estazolam (Prosom), oxazepam (Serax), temazepam (Restoril), triazolam (Halcion) (Last updated: 04/17/2017) ____________________________________________________________________________________________    BMI Assessment: Estimated body mass index is 33.67 kg/m as calculated from the following:   Height as of this encounter: 5' 9"  (1.753 m).   Weight as of this encounter: 228 lb (103.4 kg).  BMI interpretation table: BMI level Category Range association with higher  incidence of chronic pain  <18 kg/m2 Underweight   18.5-24.9 kg/m2 Ideal body weight   25-29.9 kg/m2 Overweight Increased incidence by 20%  30-34.9 kg/m2 Obese (Class I) Increased incidence by 68%  35-39.9 kg/m2 Severe obesity (Class II) Increased incidence by 136%  >40 kg/m2 Extreme obesity (Class III) Increased incidence by 254%   Patient's current BMI Ideal Body weight  Body mass index is 33.67 kg/m. Ideal body weight: 70.7 kg (155 lb 13.8 oz) Adjusted ideal body weight: 83.8 kg (184 lb 11.5 oz)   BMI Readings from Last 4 Encounters:  09/11/17 33.67 kg/m  06/10/17 32.80 kg/m  05/27/17 32.49 kg/m  05/13/17 32.58 kg/m   Wt Readings from Last 4 Encounters:  09/11/17 228 lb (103.4 kg)  06/10/17 222 lb 1.6 oz (100.7 kg)  05/27/17 220 lb (99.8 kg)  05/13/17 220 lb 9.6 oz (100.1 kg)   You were given 3 prescriptions for Oxycodone today. Prescriptions for Narcan, Flexeril, and Gabapentin were sent to your pharmacy.

## 2017-09-11 NOTE — Progress Notes (Signed)
Patient's Name: Kenneth Osborn  MRN: [id number]  Referring Provider: Ellene Route  DOB: 09/18/1955  PCP: Ellene Route  DOS: 09/11/2017  Note by: Vevelyn Francois NP  Service setting: Ambulatory outpatient  Specialty: Interventional Pain Management  Location: [facility] (AMB) Pain Management Facility    Patient type: Established    Primary Reason(s) for Visit: Encounter for prescription drug management. (Level of risk: moderate)  CC: Back Pain (lower, worse on right side)  HPI  Kenneth Osborn is a 62 y.o. year old, male patient, who comes today for a medication management evaluation. 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 Beverely Pace at [facility] on 01/04/2013); Long term current use of opiate analgesic; Long term prescription opiate use; Opiate use (60 MME/Day); Opiate dependence ([place]); Encounter for therapeutic drug level monitoring; Neurogenic pain; Thoracic facet syndrome (T8-10); Lumbar facet syndrome (Bilateral); Cervical facet syndrome (Right); Cervical spondylosis; Lumbar spondylosis; Chronic upper extremity pain (Left); Chronic cervical radicular pain (Left); Chronic upper back pain; History of thoracic spine surgery (S/P T9-10 IVD spacer); Failed back surgical syndrome; Chronic musculoskeletal pain; Myofascial pain; Chronic lower extremity pain (Left); Chronic radicular lumbar pain (left L4 dermatomal pain); Coronary artery disease; History of MI (myocardial infarction) (May 2012); Chronic anticoagulation (on Effient); History of Meniere's disease; Sleep apnea; Type 2 diabetes mellitus with  hyperglycemia ([place]); Encounter for interrogation of neurostimulator; [employer] spinal cord stimulator (implant date: 11/01/2014); Unstable angina ([place]); Diabetes mellitus, type II ([place]); Arthralgia of shoulder; CAD (coronary artery disease); Paroxysmal supraventricular tachycardia ([place]); Balance problem; Chronic tension-type headache, not intractable; Dizziness; Post-concussion headache; Mild cognitive impairment; 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); Bilateral carotid artery stenosis; Uncontrolled type 2 diabetes mellitus with hyperglycemia, with long-term current use of insulin ([place]); DM type 2 with diabetic peripheral neuropathy ([place]); Chronic pain syndrome; Numbness and tingling; Tachycardia with heart rate 100-120 beats per minute; Tremor; Meniere disease, bilateral; CKD (chronic kidney disease) stage 3, GFR 30-59 ml/min ([place]); Headache disorder; Type 2 diabetes mellitus with diabetic neuropathy ([place]); Trigger point posterior superior iliac spine (PSIS) (Right); 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 His primarily concern today is the Back Pain (lower, worse on right side)  Pain Assessment: Location: Lower Back Radiating: left leg to the toes Onset: More than a month ago Duration: Chronic pain Quality: Dull Severity: 4 /10 (subjective, self-reported pain score)  Note: Reported level is compatible with observation.                          Effect on ADL:   Timing: Constant Modifying factors: medications, rest BP: 120/83  HR: 92  Mr. Minturn was last scheduled for an appointment on 05/27/2017 for medication management. During today's appointment we reviewed Kenneth Osborn's chronic pain status, as well as his outpatient medication regimen. He admits that he has numbness in  his left arm and leg. He has not contacted [employer] for his SCS. He admits that it has been off for 3-4 months. He is not sure how effective it is. He admits that  it was effective during the trial. He wonders if starting fresh would be more effective. He admits that he continues to have limitation doing any bending or lifting so he just avoids this. He denies any new concerns today. He denies any side effects of his medication.   The patient  reports that he does not use drugs. His body mass index is 33.67 kg/m.  Further details on both, my assessment(s), as well as the proposed treatment plan, please see below.  Controlled Substance Pharmacotherapy Assessment REMS (Risk Evaluation and Mitigation Strategy)  Analgesic:Oxycodone IR 10 mg every 6 hours (40 mg/day) MME/day:60 mg/day   Kenneth Martins, RN  09/11/2017  9:07 AM  Sign at close encounter Nursing Pain Medication Assessment:  Safety precautions to be maintained throughout the outpatient stay will include: orient to surroundings, keep bed in low position, maintain call bell within reach at all times, provide assistance with transfer out of bed and ambulation.  Medication Inspection Compliance: Pill count conducted under aseptic conditions, in front of the patient. Neither the pills nor the bottle was removed from the patient's sight at any time. Once count was completed pills were immediately returned to the patient in their original bottle.  Medication: Oxycodone IR Pill/Patch Count: 86 of 120 pills remain Pill/Patch Appearance: Markings consistent with prescribed medication Bottle Appearance: Standard pharmacy container. Clearly labeled. Filled Date: 07/14 / 2019 Last Medication intake:  Today   Pharmacokinetics: Liberation and absorption (onset of action): WNL Distribution (time to peak effect): WNL Metabolism and excretion (duration of action): WNL         Pharmacodynamics: Desired effects: Analgesia: Kenneth Osborn reports  >50% benefit. Functional ability: Patient reports that medication allows him to accomplish basic ADLs Clinically meaningful improvement in function (CMIF): Sustained CMIF goals met Perceived effectiveness: Described as relatively effective, allowing for increase in activities of daily living (ADL) Undesirable effects: Side-effects or Adverse reactions: None reported Monitoring: [place] PMP: Online review of the past 82-monthperiod conducted. Compliant with practice rules and regulations Last UDS on record: Summary  Date Value Ref Range Status  05/27/2017 FINAL  Final    Comment:    ==================================================================== TOXASSURE SELECT 13 (MW) ==================================================================== Test                             Result       Flag       Units Drug Present and Declared for Prescription Verification   Desmethyldiazepam              177          EXPECTED   ng/mg creat   Oxazepam                       766          EXPECTED   ng/mg creat   Temazepam                      331          EXPECTED   ng/mg creat    Desmethyldiazepam, oxazepam, and temazepam are expected    metabolites of diazepam. Desmethyldiazepam and oxazepam are also    expected metabolites of other drugs, including chlordiazepoxide,    prazepam, clorazepate, and halazepam. Oxazepam is an expected    metabolite of temazepam. Oxazepam and temazepam are also    available as scheduled prescription medications.   Oxycodone  1616         EXPECTED   ng/mg creat   Oxymorphone                    5968         EXPECTED   ng/mg creat   Noroxycodone                   1338         EXPECTED   ng/mg creat   Noroxymorphone                 2197         EXPECTED   ng/mg creat    Sources of oxycodone are scheduled prescription medications.    Oxymorphone, noroxycodone, and noroxymorphone are expected    metabolites of oxycodone. Oxymorphone is also available as a     scheduled prescription medication. Drug Absent but Declared for Prescription Verification   Alprazolam                     Not Detected UNEXPECTED ng/mg creat ==================================================================== Test                      Result    Flag   Units      Ref Range   Creatinine              115              mg/dL      >=20 ==================================================================== Declared Medications:  The flagging and interpretation on this report are based on the  following declared medications.  Unexpected results may arise from  inaccuracies in the declared medications.  **Note: The testing scope of this panel includes these medications:  Alprazolam (Xanax)  Diazepam (Valium)  Oxycodone  **Note: The testing scope of this panel does not include following  reported medications:  Aspirin  Cetirizine (Zyrtec)  Cyclobenzaprine  Diltiazem (Cardizem)  Docusate (Colace)  Enalapril (Vasotec)  Ezetimibe (Zetia)  Gabapentin  Hydrochlorothiazide (Maxzide)  Insulin (Humulin)  Magnesium  Magnesium (Mag-Ox)  Meclizine (Antivert)  Metformin  Methylprednisolone (Medrol Dose Pack)  Metoprolol  Montelukast (Singulair)  Naloxone (Narcan)  Niacin (Niaspan)  Omega-3 Fatty Acids (Fish Oil)  Omeprazole (Nexium)  Prasugrel (Effient)  Ranitidine (Zantac)  Rosuvastatin (Crestor)  Triamterene (Maxzide) ==================================================================== For clinical consultation, please call [phone]. ====================================================================    UDS interpretation: Compliant          Medication Assessment Form: Reviewed. Patient indicates being compliant with therapy Treatment compliance: Compliant Risk Assessment Profile: Aberrant behavior: See prior evaluations. None observed or detected today Comorbid factors increasing risk of overdose: See prior notes. No additional risks detected today Risk of  substance use disorder (SUD): Low Opioid Risk Tool - 09/11/17 0905      Family History of Substance Abuse   Alcohol  Negative    Illegal Drugs  Negative    Rx Drugs  Negative      Personal History of Substance Abuse   Alcohol  Negative    Illegal Drugs  Negative    Rx Drugs  Negative      Age   Age between 81-45 years   No      History of Preadolescent Sexual Abuse   History of Preadolescent Sexual Abuse  Negative or Male      Psychological Disease   Psychological Disease  Negative    Depression  Negative  Total Score   Opioid Risk Tool Scoring  0    Opioid Risk Interpretation  Low Risk      ORT Scoring interpretation table:  Score <3 = Low Risk for SUD  Score between 4-7 = Moderate Risk for SUD  Score >8 = High Risk for Opioid Abuse   Risk Mitigation Strategies:  Patient Counseling: Covered Patient-Prescriber Agreement (PPA): Present and active  Notification to other healthcare providers: Done  Pharmacologic Plan: No change in therapy, at this time.             Laboratory Chemistry  Inflammation Markers (CRP: Acute Phase) (ESR: Chronic Phase) Lab Results  Component Value Date   CRP 1.4 (H) 03/22/2015   ESRSEDRATE 9 03/22/2015                         Rheumatology Markers No results found for: RF, ANA, LABURIC, URICUR, LYMEIGGIGMAB, LYMEABIGMQN, HLAB27                      Renal Function Markers Lab Results  Component Value Date   BUN 20 06/16/2015   CREATININE 1.24 06/16/2015   GFRAA >60 06/16/2015   GFRNONAA >60 06/16/2015                             Hepatic Function Markers Lab Results  Component Value Date   AST 27 03/22/2015   ALT 34 03/22/2015   ALBUMIN 4.1 03/22/2015   ALKPHOS 84 03/22/2015                        Electrolytes Lab Results  Component Value Date   NA 139 06/16/2015   K 4.7 06/16/2015   CL 107 06/16/2015   CALCIUM 9.5 06/16/2015   MG 1.7 03/22/2015                        Neuropathy Markers No results found for:  VITAMINB12, FOLATE, HGBA1C, HIV                      Bone Pathology Markers No results found for: VD25OH, VD125OH2TOT, TK2409BD5, HG9924QA8, 25OHVITD1, 25OHVITD2, 25OHVITD3, TESTOFREE, TESTOSTERONE                       Coagulation Parameters Lab Results  Component Value Date   INR 1.0 02/23/2014   LABPROT 12.9 02/23/2014   APTT 25.9 08/18/2013   PLT 180 06/16/2015                        Cardiovascular Markers Lab Results  Component Value Date   CKTOTAL 155 02/23/2014   CKMB 3.3 02/23/2014   TROPONINI < 0.02 02/23/2014   HGB 13.1 06/16/2015   HCT 38.5 (L) 06/16/2015                         CA Markers No results found for: CEA, CA125, LABCA2                      Note: Lab results reviewed.  Recent Diagnostic Imaging Results  DG Lumbar Spine Complete W/Bend CLINICAL DATA:  Ionic upper lower back pain. Status post spinal stimulator placement in April 2018  EXAM: [place]  COMPARISON:  MRI of the lumbar spine of March 12, 2013  FINDINGS: Nerve stimulator electrodes enter the posterior paraspinous soft tissues at L 3 4 and extends superiorly to approximately T9 through T12. The lumbar vertebral bodies are preserved in height. There is spina bifida occulta at L5 and S1. There is no spondylolisthesis. There are no pars defects. The disc space heights are well maintained. There is calcification in the wall of the abdominal aorta.  IMPRESSION: There is no acute bony abnormality of the lumbar spine. There is no significant degenerative disc disease and there is no spondylolisthesis.  Electronically Signed   By: David  Martinique M.D.   On: 12/26/2016 13:00 DG Thoracic Spine 2 View CLINICAL DATA:  Upper back pain, lower back pain, no injury  EXAM: THORACIC SPINE 2 VIEWS  COMPARISON:  Chest x-ray of 08/18/2013  FINDINGS: The thoracic vertebrae are in normal alignment. Intervertebral disc spaces appear normal. Interbody fusion plug  is Breath and T8-9. Neurostimulator electrode leads overlie the mid lower thoracic spinal canal. No compression deformity is seen. No prominent paravertebral soft tissue is noted. Lower anterior cervical spine fusion plate is present which appears to be at C6-7  IMPRESSION: No acute abnormality.  Normal alignment.  Electronically Signed   By: Ivar Drape M.D.   On: 12/26/2016 13:00  Complexity Note: Imaging results reviewed. Results shared with Mr. Chisolm, using Layman's terms.                         Meds   Current Outpatient Medications:  .  ACCU-CHEK AVIVA PLUS test strip, , Disp: , Rfl: 2 .  ALPRAZolam (XANAX) 0.5 MG tablet, Take 0.5 mg by mouth at bedtime as needed (Meniere's disease). , Disp: , Rfl:  .  aspirin EC 81 MG tablet, Take 81 mg by mouth at bedtime. , Disp: , Rfl:  .  cetirizine (ZYRTEC) 10 MG tablet, Take 10 mg by mouth daily., Disp: , Rfl:  .  diazepam (VALIUM) 5 MG tablet, Take 5 mg by mouth 3 (three) times daily as needed (Meniere's disease). , Disp: , Rfl:  .  diltiazem (CARTIA XT) 240 MG 24 hr capsule, Take 240 mg by mouth at bedtime., Disp: , Rfl:  .  docusate sodium (COLACE) 100 MG capsule, Take 100 mg by mouth daily., Disp: , Rfl:  .  enalapril (VASOTEC) 10 MG tablet, Take 10 mg by mouth 2 (two) times daily., Disp: , Rfl:  .  esomeprazole (NEXIUM) 40 MG capsule, Take 40 mg by mouth 2 (two) times daily., Disp: , Rfl:  .  ezetimibe (ZETIA) 10 MG tablet, Take 10 mg by mouth daily., Disp: , Rfl:  .  fenofibrate 160 MG tablet, Take 160 mg by mouth daily., Disp: , Rfl:  .  HUMULIN R U-500 KWIKPEN 500 UNIT/ML kwikpen, 65 units before breakfast and lunch; 55 units before supper, Disp: , Rfl: 0 .  Magnesium Oxide 500 MG TABS, Take by mouth., Disp: , Rfl:  .  meclizine (ANTIVERT) 25 MG tablet, Take 25 mg by mouth 3 (three) times daily as needed for dizziness. , Disp: , Rfl:  .  metFORMIN (GLUCOPHAGE) 1000 MG tablet, Take 1,000 mg by mouth 2 (two) times daily with a  meal., Disp: , Rfl:  .  metoprolol succinate (TOPROL-XL) 50 MG 24 hr tablet, Take 50 mg by mouth 2 (two) times daily. Take with or immediately following a meal. , Disp: , Rfl:  .  montelukast (SINGULAIR) 10 MG  tablet, Take by mouth daily. , Disp: , Rfl:  .  naloxone (NARCAN) 2 MG/2ML injection, Inject content of syringe into thigh muscle. Call 911., Disp: 2 Syringe, Rfl: 1 .  niacin (NIASPAN) 500 MG CR tablet, Take 500 mg by mouth at bedtime., Disp: , Rfl:  .  Omega-3 Fatty Acids (FISH OIL) 1000 MG CAPS, Take 1,200 mg by mouth 2 (two) times daily. , Disp: , Rfl:  .  [START ON 11/29/2017] Oxycodone HCl 10 MG TABS, Take 1 tablet (10 mg total) by mouth every 6 (six) hours as needed., Disp: 120 tablet, Rfl: 0 .  prasugrel (EFFIENT) 10 MG TABS, Take 10 mg by mouth daily., Disp: , Rfl:  .  ranitidine (ZANTAC) 150 MG tablet, Take 150 mg by mouth at bedtime. , Disp: , Rfl:  .  rosuvastatin (CRESTOR) 20 MG tablet, Take 20 mg by mouth at bedtime., Disp: , Rfl:  .  triamterene-hydrochlorothiazide (MAXZIDE) 75-50 MG tablet, Take 1 tablet by mouth daily. , Disp: , Rfl:  .  [START ON 09/30/2017] cyclobenzaprine (FLEXERIL) 10 MG tablet, Take 1 tablet (10 mg total) by mouth every 8 (eight) hours as needed for muscle spasms., Disp: 270 tablet, Rfl: 1 .  [START ON 09/30/2017] gabapentin (NEURONTIN) 800 MG tablet, Take 2 tablets (1,600 mg total) by mouth 2 (two) times daily., Disp: 360 tablet, Rfl: 1 .  [START ON 10/30/2017] Oxycodone HCl 10 MG TABS, Take 1 tablet (10 mg total) by mouth every 6 (six) hours as needed., Disp: 120 tablet, Rfl: 0 .  [START ON 09/30/2017] Oxycodone HCl 10 MG TABS, Take 1 tablet (10 mg total) by mouth every 6 (six) hours as needed., Disp: 120 tablet, Rfl: 0  ROS  Constitutional: Denies any fever or chills Gastrointestinal: No reported hemesis, hematochezia, vomiting, or acute GI distress Musculoskeletal: Denies any acute onset joint swelling, redness, loss of ROM, or weakness Neurological:  No reported episodes of acute onset apraxia, aphasia, dysarthria, agnosia, amnesia, paralysis, loss of coordination, or loss of consciousness  Allergies  Mr. Bacchi has No Known Allergies.  PFSH  Drug: Mr. Devol  reports that he does not use drugs. Alcohol:  reports that he does not drink alcohol. Tobacco:  reports that he has never smoked. He has never used smokeless tobacco. Medical:  has a past medical history of Allergic rhinitis (12/30/2012), Bronchitis, Can't get food down (08/12/2014), Chronic back pain, Concussion (09/2015), COPD (chronic obstructive pulmonary disease) ([place]), Coronary artery disease, DDD (degenerative disc disease), cervical, Dehydration symptoms, Diabetes mellitus without complication ([place]), Dysphagia, GERD (gastroesophageal reflux disease), History of Meniere's disease (12/21/2014), History of thoracic spine surgery (S/P T9-10 IVD spacer) (12/21/2014), Hypercholesteremia, Hyperlipidemia, Hypertension, Meniere's disease, Myocardial infarction ([place]), Neuromuscular disorder ([place]), Psychosis ([place]), Short-segment Barrett's esophagus, and Sleep apnea. Surgical: Mr. Mounsey  has a past surgical history that includes Cardiovascular stress test; Cardiac catheterization; Coronary angioplasty; Labrinthectomy; mastoid shunt; Appendectomy; Back surgery; Shoulder arthroscopy with subacromial decompression (Left, 04/06/2012); ARTHRODESIS ANTERIOR ANTERIOR CERVICLE SPINE (01/04/2013); Colonoscopy with propofol (N/A, 09/19/2014); Esophagogastroduodenoscopy (N/A, 09/19/2014); Savory dilation (N/A, 09/19/2014); Spinal cord stimulator implant (Right); Cardiac catheterization (N/A, 06/15/2015); and Cardiac catheterization (N/A, 06/15/2015). Family: family history includes Cancer in his sister; Diabetes in his maternal grandmother, mother, and paternal grandmother; Heart disease in his father, maternal aunt, maternal uncle, and mother.  Constitutional Exam  General appearance: Well nourished, well developed, and  well hydrated. In no apparent acute distress Vitals:   09/11/17 0855  BP: 120/83  Pulse: 92  Resp: 16  Temp: 98.2  F (36.8 C)  TempSrc: Oral  SpO2: 96%  Weight: 228 lb (103.4 kg)  Height: _0  (1.753 m)  Psych/Mental status: Alert, oriented x 3 (person, place, & time)       Eyes: PERLA Respiratory: No evidence of acute respiratory distress  Cervical Spine Area Exam  Skin & Axial Inspection: No masses, redness, edema, swelling, or associated skin lesions Alignment: Symmetrical Functional ROM: Unrestricted ROM      Stability: No instability detected Muscle Tone/Strength: Functionally intact. No obvious neuro-muscular anomalies detected. Sensory (Neurological): Unimpaired Palpation: No palpable anomalies              Upper Extremity (UE) Exam    Side: Right upper extremity  Side: Left upper extremity  Skin & Extremity Inspection: Skin color, temperature, and hair growth are WNL. No peripheral edema or cyanosis. No masses, redness, swelling, asymmetry, or associated skin lesions. No contractures.  Skin & Extremity Inspection: Skin color, temperature, and hair growth are WNL. No peripheral edema or cyanosis. No masses, redness, swelling, asymmetry, or associated skin lesions. No contractures.  Functional ROM: Unrestricted ROM          Functional ROM: Unrestricted ROM          Muscle Tone/Strength: Functionally intact. No obvious neuro-muscular anomalies detected.  Muscle Tone/Strength: Functionally intact. No obvious neuro-muscular anomalies detected.  Sensory (Neurological): Unimpaired          Sensory (Neurological): Unimpaired          Palpation: No palpable anomalies              Palpation: No palpable anomalies              Provocative Test(s):  Phalen's test: deferred Tinel's test: deferred Apley's scratch test (touch opposite shoulder):  Action 1 (Across chest): deferred Action 2 (Overhead): deferred Action 3 (LB reach): deferred   Provocative Test(s):  Phalen's test:  deferred Tinel's test: deferred Apley's scratch test (touch opposite shoulder):  Action 1 (Across chest): deferred Action 2 (Overhead): deferred Action 3 (LB reach): deferred    Thoracic Spine Area Exam  Skin & Axial Inspection: No masses, redness, or swelling Alignment: Symmetrical Functional ROM: Unrestricted ROM Stability: No instability detected Muscle Tone/Strength: Functionally intact. No obvious neuro-muscular anomalies detected. Sensory (Neurological): Unimpaired Muscle strength & Tone: No palpable anomalies  Lumbar Spine Area Exam  Skin & Axial Inspection: Well healed scar from previous spine surgery detected SCS palpable.  Alignment: Symmetrical Functional ROM: Unrestricted ROM       Stability: No instability detected Muscle Tone/Strength: Functionally intact. No obvious neuro-muscular anomalies detected. Sensory (Neurological): Unimpaired Palpation: Complains of area being tender to palpation       Provocative Tests: Lumbar Hyperextension/rotation test: deferred today      declined Lumbar quadrant test (Kemp's test): deferred today       Lumbar Lateral bending test: deferred today        Gait & Posture Assessment  Ambulation: Unassisted Gait: Relatively normal for age and body habitus Posture: WNL   Lower Extremity Exam    Side: Right lower extremity  Side: Left lower extremity  Stability: No instability observed          Stability: No instability observed          Skin & Extremity Inspection: Skin color, temperature, and hair growth are WNL. No peripheral edema or cyanosis. No masses, redness, swelling, asymmetry, or associated skin lesions. No contractures.  Skin & Extremity Inspection: Skin color, temperature, and hair growth  are WNL. No peripheral edema or cyanosis. No masses, redness, swelling, asymmetry, or associated skin lesions. No contractures.  Functional ROM: Unrestricted ROM                  Functional ROM: Unrestricted ROM                  Muscle  Tone/Strength: Functionally intact. No obvious neuro-muscular anomalies detected.  Muscle Tone/Strength: Functionally intact. No obvious neuro-muscular anomalies detected.  Sensory (Neurological): Unimpaired  Sensory (Neurological): Unimpaired  Palpation: No palpable anomalies  Palpation: No palpable anomalies   Assessment  Primary Diagnosis & Pertinent Problem List: The primary encounter diagnosis was Diabetic polyneuropathy associated with diabetes mellitus due to underlying condition ([place]). Diagnoses of Lumbar spondylosis, Osteoarthritis of cervical spine, unspecified spinal osteoarthritis complication status, Musculoskeletal pain, Chronic pain syndrome, and Neuropathic pain were also pertinent to this visit.  Status Diagnosis  Controlled Controlled Controlled 1. Diabetic polyneuropathy associated with diabetes mellitus due to underlying condition ([place])   2. Lumbar spondylosis   3. Osteoarthritis of cervical spine, unspecified spinal osteoarthritis complication status   4. Musculoskeletal pain   5. Chronic pain syndrome   6. Neuropathic pain     Problems updated and reviewed during this visit: Problem  Diabetes Mellitus, Type II (Hcc)  Hypotension Due to Hypovolemia  Hyperlipidemia  Cardiac Syncope  History of Coronary Artery Disease  Chronic Back Pain  Dysphagia  Gerd (Gastroesophageal Reflux Disease)  Dysphagia (Resolved)   Plan of Care  Pharmacotherapy (Medications Ordered): Meds ordered this encounter  Medications  . cyclobenzaprine (FLEXERIL) 10 MG tablet    Sig: Take 1 tablet (10 mg total) by mouth every 8 (eight) hours as needed for muscle spasms.    Dispense:  270 tablet    Refill:  1    3 Month refill. Do not add this medication to the electronic "Automatic Refill" notification system. Patient may have prescription filled one day early if pharmacy is closed on scheduled refill date.    Order Specific Question:   Supervising Provider    Answer:   Milinda Pointer  [phone]  . gabapentin (NEURONTIN) 800 MG tablet    Sig: Take 2 tablets (1,600 mg total) by mouth 2 (two) times daily.    Dispense:  360 tablet    Refill:  1    Do not place this medication, or any other prescription from our practice, on "Automatic Refill". Patient may have prescription filled one day early if pharmacy is closed on scheduled refill date. (3 mo refill)    Order Specific Question:   Supervising Provider    Answer:   Milinda Pointer [phone]  . Oxycodone HCl 10 MG TABS    Sig: Take 1 tablet (10 mg total) by mouth every 6 (six) hours as needed.    Dispense:  120 tablet    Refill:  0    Do not place this medication, or any other prescription from our practice, on "Automatic Refill". Patient may have prescription filled one day early if pharmacy is closed on scheduled refill date. Do not fill until:11/29/2017 To last until: 12/29/2017    Order Specific Question:   Supervising Provider    Answer:   Milinda Pointer [phone]  . Oxycodone HCl 10 MG TABS    Sig: Take 1 tablet (10 mg total) by mouth every 6 (six) hours as needed.    Dispense:  120 tablet    Refill:  0    Do not place this medication, or  any other prescription from our practice, on "Automatic Refill". Patient may have prescription filled one day early if pharmacy is closed on scheduled refill date. Do not fill until: 10/30/2017 To last until:11/29/2017    Order Specific Question:   Supervising Provider    Answer:   Milinda Pointer [phone]  . Oxycodone HCl 10 MG TABS    Sig: Take 1 tablet (10 mg total) by mouth every 6 (six) hours as needed.    Dispense:  120 tablet    Refill:  0    Do not place this medication, or any other prescription from our practice, on "Automatic Refill". Patient may have prescription filled one day early if pharmacy is closed on scheduled refill date. Do not fill until:09/30/2017 To last until: 10/30/2017    Order Specific Question:   Supervising Provider    Answer:   Milinda Pointer [phone]  . naloxone (NARCAN) 2 MG/2ML injection    Sig: Inject content of syringe into thigh muscle. Call 911.    Dispense:  2 Syringe    Refill:  1    NDC # [id number]. Please teach proper use of device.    Order Specific Question:   Supervising Provider    Answer:   Milinda Pointer [[id number]]   New Prescriptions   No medications on file   Medications administered today: [name] had no medications administered during this visit. Lab-work, procedure(s), and/or referral(s): No orders of the defined types were placed in this encounter.  Imaging and/or referral(s): None  Interventional therapies: Planned, scheduled, and/or pending: None at this time.He will call for procedure in the future   Considering: None at this time.   Palliative PRN treatment(s): Right side posterior superior iliac spine Trigger PointInjection  Provider-requested follow-up: Return in about 3 months (around 12/12/2017) for MedMgmt with Me Donella St[facility]).  Future Appointments  Date Time Provider [place]  12/03/2017  8:30 AM Vevelyn Francois, NP [facility] None   Primary Care Physician: Ellene Route Location: [facility] Note by: Vevelyn Francois NP Date: 09/11/2017; Time: 10:28 AM  Pain Score Disclaimer: We use the NRS-11 scale. This is a self-reported, subjective measurement of pain severity with only modest accuracy. It is used primarily to identify changes within a particular patient. It must be understood that outpatient pain scales are significantly less accurate that those used for research, where they can be applied under ideal controlled circumstances with minimal exposure to variables. In reality, the score is likely to be a combination of pain intensity and pain affect, where pain affect describes the degree of emotional arousal or changes in action readiness caused by the sensory experience of pain. Factors such as  social and work situation, setting, emotional state, anxiety levels, expectation, and prior pain experience may influence pain perception and show large inter-individual differences that may also be affected by time variables.  Patient instructions provided during this appointment: Patient Instructions   ____________________________________________________________________________________________  Medication Rules  Applies to: All patients receiving prescriptions (written or electronic).  Pharmacy of record: Pharmacy where electronic prescriptions will be sent. If written prescriptions are taken to a different pharmacy, please inform the nursing staff. The pharmacy listed in the electronic medical record should be the one where you would like electronic prescriptions to be sent.  Prescription refills: Only during scheduled appointments. Applies to both, written and electronic prescriptions.  NOTE: The following applies primarily to controlled substances (Opioid* Pain Medications).   Patient's responsibilities: 1. Pain Pills: Bring  all pain pills to every appointment (except for procedure appointments). 2. Pill Bottles: Bring pills in original pharmacy bottle. Always bring newest bottle. Bring bottle, even if empty. 3. Medication refills: You are responsible for knowing and keeping track of what medications you need refilled. The day before your appointment, write a list of all prescriptions that need to be refilled. Bring that list to your appointment and give it to the admitting nurse. Prescriptions will be written only during appointments. If you forget a medication, it will not be "Called in", "Faxed", or "electronically sent". You will need to get another appointment to get these prescribed. 4. Prescription Accuracy: You are responsible for carefully inspecting your prescriptions before leaving our office. Have the discharge nurse carefully go over each prescription with you, before taking  them home. Make sure that your name is accurately spelled, that your address is correct. Check the name and dose of your medication to make sure it is accurate. Check the number of pills, and the written instructions to make sure they are clear and accurate. Make sure that you are given enough medication to last until your next medication refill appointment. 5. Taking Medication: Take medication as prescribed. Never take more pills than instructed. Never take medication more frequently than prescribed. Taking less pills or less frequently is permitted and encouraged, when it comes to controlled substances (written prescriptions).  6. Inform other Doctors: Always inform, all of your healthcare providers, of all the medications you take. 7. Pain Medication from other Providers: You are not allowed to accept any additional pain medication from any other Doctor or Healthcare provider. There are two exceptions to this rule. (see below) In the event that you require additional pain medication, you are responsible for notifying us, as stated below. 8. Medication Agreement: You are responsible for carefully reading and following our Medication Agreement. This must be signed before receiving any prescriptions from our practice. Safely store a copy of your signed Agreement. Violations to the Agreement will result in no further prescriptions. (Additional copies of our Medication Agreement are available upon request.) 9. Laws, Rules, & Regulations: All patients are expected to follow all Federal and [employer], Rules, [employer]. Ignorance of the Laws does not constitute a valid excuse. The use of any illegal substances is prohibited. 10. Adopted CDC guidelines & recommendations: Target dosing levels will be at or below 60 MME/day. Use of benzodiazepines** is not recommended.  Exceptions: There are only two exceptions to the rule of not receiving pain medications from other Healthcare  Providers. 1. Exception #1 (Emergencies): In the event of an emergency (i.e.: accident requiring emergency care), you are allowed to receive additional pain medication. However, you are responsible for: As soon as you are able, call our office [phone], at any time of the day or night, and leave a message stating your name, the date and nature of the emergency, and the name and dose of the medication prescribed. In the event that your call is answered by a member of our staff, make sure to document and save the date, time, and the name of the person that took your information.  2. Exception #2 (Planned Surgery): In the event that you are scheduled by another doctor or dentist to have any type of surgery or procedure, you are allowed (for a period no longer than 30 days), to receive additional pain medication, for the acute post-op pain. However, in this case, you are responsible for picking up a copy of  our "Post-op Pain Management for Surgeons" handout, and giving it to your surgeon or dentist. This document is available at our office, and does not require an appointment to obtain it. Simply go to our office during business hours (Monday-Thursday from 8:00 AM to 4:00 PM) (Friday 8:00 AM to 12:00 Noon) or if you have a scheduled appointment with [name], prior to your surgery, and ask for it by name. In addition, you will need to provide [name] with your name, name of your surgeon, type of surgery, and date of procedure or surgery.  *Opioid medications include: morphine, codeine, oxycodone, oxymorphone, hydrocodone, hydromorphone, meperidine, tramadol, tapentadol, buprenorphine, fentanyl, methadone. **Benzodiazepine medications include: diazepam (Valium), alprazolam (Xanax), clonazepam (Klonopine), lorazepam (Ativan), clorazepate (Tranxene), chlordiazepoxide (Librium), estazolam (Prosom), oxazepam (Serax), temazepam (Restoril), triazolam (Halcion) (Last updated:  04/17/2017) ____________________________________________________________________________________________    BMI Assessment: Estimated body mass index is 33.67 kg/m as calculated from the following:   Height as of this encounter: _0  (1.753 m).   Weight as of this encounter: 228 lb (103.4 kg).  BMI interpretation table: BMI level Category Range association with higher incidence of chronic pain  <18 kg/m2 Underweight   18.5-24.9 kg/m2 Ideal body weight   25-29.9 kg/m2 Overweight Increased incidence by 20%  30-34.9 kg/m2 Obese (Class I) Increased incidence by 68%  35-39.9 kg/m2 Severe obesity (Class II) Increased incidence by 136%  >40 kg/m2 Extreme obesity (Class III) Increased incidence by 254%   Patient's current BMI Ideal Body weight  Body mass index is 33.67 kg/m. Ideal body weight: 70.7 kg (155 lb 13.8 oz) Adjusted ideal body weight: 83.8 kg (184 lb 11.5 oz)   BMI Readings from Last 4 Encounters:  09/11/17 33.67 kg/m  06/10/17 32.80 kg/m  05/27/17 32.49 kg/m  05/13/17 32.58 kg/m   Wt Readings from Last 4 Encounters:  09/11/17 228 lb (103.4 kg)  06/10/17 222 lb 1.6 oz (100.7 kg)  05/27/17 220 lb (99.8 kg)  05/13/17 220 lb 9.6 oz (100.1 kg)   You were given 3 prescriptions for Oxycodone today. Prescriptions for Narcan, Flexeril, and Gabapentin were sent to your pharmacy.

## 2017-09-11 NOTE — Progress Notes (Signed)
Nursing Pain Medication Assessment:  Safety precautions to be maintained throughout the outpatient stay will include: orient to surroundings, keep bed in low position, maintain call bell within reach at all times, provide assistance with transfer out of bed and ambulation.  Medication Inspection Compliance: Pill count conducted under aseptic conditions, in front of the patient. Neither the pills nor the bottle was removed from the patient's sight at any time. Once count was completed pills were immediately returned to the patient in their original bottle.  Medication: Oxycodone IR Pill/Patch Count: 86 of 120 pills remain Pill/Patch Appearance: Markings consistent with prescribed medication Bottle Appearance: Standard pharmacy container. Clearly labeled. Filled Date: 07/14 / 2019 Last Medication intake:  Today

## 2017-11-13 ENCOUNTER — Ambulatory Visit: Payer: Medicare Other | Attending: Pain Medicine | Admitting: Pain Medicine

## 2017-11-13 ENCOUNTER — Other Ambulatory Visit: Payer: Self-pay

## 2017-11-13 ENCOUNTER — Encounter: Payer: Self-pay | Admitting: Pain Medicine

## 2017-11-13 VITALS — BP 106/62 | HR 81 | Temp 98.3°F | Resp 16 | Ht 69.0 in | Wt 230.0 lb

## 2017-11-13 DIAGNOSIS — Z981 Arthrodesis status: Secondary | ICD-10-CM | POA: Diagnosis not present

## 2017-11-13 DIAGNOSIS — M47817 Spondylosis without myelopathy or radiculopathy, lumbosacral region: Secondary | ICD-10-CM

## 2017-11-13 DIAGNOSIS — M545 Low back pain: Secondary | ICD-10-CM | POA: Diagnosis present

## 2017-11-13 DIAGNOSIS — M549 Dorsalgia, unspecified: Secondary | ICD-10-CM

## 2017-11-13 DIAGNOSIS — G8929 Other chronic pain: Secondary | ICD-10-CM | POA: Diagnosis not present

## 2017-11-13 DIAGNOSIS — M7918 Myalgia, other site: Secondary | ICD-10-CM | POA: Insufficient documentation

## 2017-11-13 DIAGNOSIS — M47816 Spondylosis without myelopathy or radiculopathy, lumbar region: Secondary | ICD-10-CM

## 2017-11-13 DIAGNOSIS — M5442 Lumbago with sciatica, left side: Secondary | ICD-10-CM

## 2017-11-13 DIAGNOSIS — Z7901 Long term (current) use of anticoagulants: Secondary | ICD-10-CM

## 2017-11-13 MED ORDER — TRIAMCINOLONE ACETONIDE 40 MG/ML IJ SUSP
40.0000 mg | Freq: Once | INTRAMUSCULAR | Status: AC
  Administered 2017-11-13: 40 mg
  Filled 2017-11-13: qty 1

## 2017-11-13 MED ORDER — ROPIVACAINE HCL 2 MG/ML IJ SOLN
4.0000 mL | Freq: Once | INTRAMUSCULAR | Status: AC
  Administered 2017-11-13: 4 mL
  Filled 2017-11-13: qty 10

## 2017-11-13 NOTE — Progress Notes (Signed)
Safety precautions to be maintained throughout the outpatient stay will include: orient to surroundings, keep bed in low position, maintain call bell within reach at all times, provide assistance with transfer out of bed and ambulation.  

## 2017-11-13 NOTE — Patient Instructions (Addendum)
____________________________________________________________________________________________  Post-Procedure Discharge Instructions  Instructions:  Apply ice: Fill a plastic sandwich bag with crushed ice. Cover it with a small towel and apply to injection site. Apply for 15 minutes then remove x 15 minutes. Repeat sequence on day of procedure, until you go to bed. The purpose is to minimize swelling and discomfort after procedure.  Apply heat: Apply heat to procedure site starting the day following the procedure. The purpose is to treat any soreness and discomfort from the procedure.  Food intake: Start with clear liquids (like water) and advance to regular food, as tolerated.   Physical activities: Keep activities to a minimum for the first 8 hours after the procedure.   Driving: If you have received any sedation, you are not allowed to drive for 24 hours after your procedure.  Blood thinner: Restart your blood thinner 6 hours after your procedure. (Only for those taking blood thinners)  Insulin: As soon as you can eat, you may resume your normal dosing schedule. (Only for those taking insulin)  Infection prevention: Keep procedure site clean and dry.  Post-procedure Pain Diary: Extremely important that this be done correctly and accurately. Recorded information will be used to determine the next step in treatment.  Pain evaluated is that of treated area only. Do not include pain from an untreated area.  Complete every hour, on the hour, for the initial 8 hours. Set an alarm to help you do this part accurately.  Do not go to sleep and have it completed later. It will not be accurate.  Follow-up appointment: Keep your follow-up appointment after the procedure. Usually 2 weeks for most procedures. (6 weeks in the case of radiofrequency.) Bring you pain diary.   Expect:  From numbing medicine (AKA: Local Anesthetics): Numbness or decrease in pain.  Onset: Full effect within 15  minutes of injected.  Duration: It will depend on the type of local anesthetic used. On the average, 1 to 8 hours.   From steroids: Decrease in swelling or inflammation. Once inflammation is improved, relief of the pain will follow.  Onset of benefits: Depends on the amount of swelling present. The more swelling, the longer it will take for the benefits to be seen. In some cases, up to 10 days.  Duration: Steroids will stay in the system x 2 weeks. Duration of benefits will depend on multiple posibilities including persistent irritating factors.  Occasional side-effects: Facial flushing, cramps (if present, drink Gatorade and take over-the-counter Magnesium 450-500 mg once to twice a day).  From procedure: Some discomfort is to be expected once the numbing medicine wears off. This should be minimal if ice and heat are applied as instructed.  Call if:  You experience numbness and weakness that gets worse with time, as opposed to wearing off.  New onset bowel or bladder incontinence. (This applies to Spinal procedures only)  Emergency Numbers:  Durning business hours (Monday - Thursday, 8:00 AM - 4:00 PM) (Friday, 9:00 AM - 12:00 Noon): [phone]  After hours: [phone] ____________________________________________________________________________________________   ____________________________________________________________________________________________  Preparing for Procedure with Sedation  Instructions: . Oral Intake: Do not eat or drink anything for at least 8 hours prior to your procedure. . Transportation: Public transportation is not allowed. Bring an adult driver. The driver must be physically present in our waiting room before any procedure can be started. Marland Kitchen Physical Assistance: Bring an adult physically capable of assisting you, in the event you need help. This adult should keep you company at  home for at least 6 hours after the procedure. . Blood Pressure  Medicine: Take your blood pressure medicine with a sip of water the morning of the procedure. . Blood thinners: Notify our staff if you are taking any blood thinners. Depending on which one you take, there will be specific instructions on how and when to stop it. . Diabetics on insulin: Notify the staff so that you can be scheduled 1st case in the morning. If your diabetes requires high dose insulin, take only  of your normal insulin dose the morning of the procedure and notify the staff that you have done so. . Preventing infections: Shower with an antibacterial soap the morning of your procedure. . Build-up your immune system: Take 1000 mg of Vitamin C with every meal (3 times a day) the day prior to your procedure. Marland Kitchen Antibiotics: Inform the staff if you have a condition or reason that requires you to take antibiotics before dental procedures. . Pregnancy: If you are pregnant, call and cancel the procedure. . Sickness: If you have a cold, fever, or any active infections, call and cancel the procedure. . Arrival: You must be in the facility at least 30 minutes prior to your scheduled procedure. . Children: Do not bring children with you. . Dress appropriately: Bring dark clothing that you would not mind if they get stained. . Valuables: Do not bring any jewelry or valuables.  Procedure appointments are reserved for interventional treatments only. Marland Kitchen No Prescription Refills. . No medication changes will be discussed during procedure appointments. . No disability issues will be discussed.  Reasons to call and reschedule or cancel your procedure: (Following these recommendations will minimize the risk of a serious complication.) . Surgeries: Avoid having procedures within 2 weeks of any surgery. (Avoid for 2 weeks before or after any surgery). . Flu Shots: Avoid having procedures within 2 weeks of a flu shots or . (Avoid for 2 weeks before or after immunizations). . Barium: Avoid having a  procedure within 7-10 days after having had a radiological study involving the use of radiological contrast. (Myelograms, Barium swallow or enema study). . Heart attacks: Avoid any elective procedures or surgeries for the initial 6 months after a "Myocardial Infarction" (Heart Attack). . Blood thinners: It is imperative that you stop these medications before procedures. Let us know if you if you take any blood thinner.  . Infection: Avoid procedures during or within two weeks of an infection (including chest colds or gastrointestinal problems). Symptoms associated with infections include: Localized redness, fever, chills, night sweats or profuse sweating, burning sensation when voiding, cough, congestion, stuffiness, runny nose, sore throat, diarrhea, nausea, vomiting, cold or Flu symptoms, recent or current infections. It is specially important if the infection is over the area that we intend to treat. Marland Kitchen Heart and lung problems: Symptoms that may suggest an active cardiopulmonary problem include: cough, chest pain, breathing difficulties or shortness of breath, dizziness, ankle swelling, uncontrolled high or unusually low blood pressure, and/or palpitations. If you are experiencing any of these symptoms, cancel your procedure and contact your primary care physician for an evaluation.  Remember:  Regular Business hours are:  Monday to Thursday 8:00 AM to 4:00 PM  Provider's Schedule: Milinda Pointer, MD:  Procedure days: Tuesday and Thursday 7:30 AM to 4:00 PM  Gillis Santa, MD:  Procedure days: Monday and Wednesday 7:30 AM to 4:00 PM ____________________________________________________________________________________________  ____________________________________________________________________________________________  Preparing for your procedure (without sedation)  Instructions: . Oral Intake: Do not eat  or drink anything for at least 3 hours prior to your procedure. . Transportation:  Unless otherwise stated by your physician, you may drive yourself after the procedure. . Blood Pressure Medicine: Take your blood pressure medicine with a sip of water the morning of the procedure. . Blood thinners: Notify our staff if you are taking any blood thinners. Depending on which one you take, there will be specific instructions on how and when to stop it. . Diabetics on insulin: Notify the staff so that you can be scheduled 1st case in the morning. If your diabetes requires high dose insulin, take only  of your normal insulin dose the morning of the procedure and notify the staff that you have done so. . Preventing infections: Shower with an antibacterial soap the morning of your procedure.  . Build-up your immune system: Take 1000 mg of Vitamin C with every meal (3 times a day) the day prior to your procedure. Marland Kitchen Antibiotics: Inform the staff if you have a condition or reason that requires you to take antibiotics before dental procedures. . Pregnancy: If you are pregnant, call and cancel the procedure. . Sickness: If you have a cold, fever, or any active infections, call and cancel the procedure. . Arrival: You must be in the facility at least 30 minutes prior to your scheduled procedure. . Children: Do not bring any children with you. . Dress appropriately: Bring dark clothing that you would not mind if they get stained. . Valuables: Do not bring any jewelry or valuables.  Procedure appointments are reserved for interventional treatments only. Marland Kitchen No Prescription Refills. . No medication changes will be discussed during procedure appointments. . No disability issues will be discussed.  Reasons to call and reschedule or cancel your procedure: (Following these recommendations will minimize the risk of a serious complication.) . Surgeries: Avoid having procedures within 2 weeks of any surgery. (Avoid for 2 weeks before or after any surgery). . Flu Shots: Avoid having procedures within 2  weeks of a flu shots or . (Avoid for 2 weeks before or after immunizations). . Barium: Avoid having a procedure within 7-10 days after having had a radiological study involving the use of radiological contrast. (Myelograms, Barium swallow or enema study). . Heart attacks: Avoid any elective procedures or surgeries for the initial 6 months after a "Myocardial Infarction" (Heart Attack). . Blood thinners: It is imperative that you stop these medications before procedures. Let us know if you if you take any blood thinner.  . Infection: Avoid procedures during or within two weeks of an infection (including chest colds or gastrointestinal problems). Symptoms associated with infections include: Localized redness, fever, chills, night sweats or profuse sweating, burning sensation when voiding, cough, congestion, stuffiness, runny nose, sore throat, diarrhea, nausea, vomiting, cold or Flu symptoms, recent or current infections. It is specially important if the infection is over the area that we intend to treat. Marland Kitchen Heart and lung problems: Symptoms that may suggest an active cardiopulmonary problem include: cough, chest pain, breathing difficulties or shortness of breath, dizziness, ankle swelling, uncontrolled high or unusually low blood pressure, and/or palpitations. If you are experiencing any of these symptoms, cancel your procedure and contact your primary care physician for an evaluation.  Remember:  Regular Business hours are:  Monday to Thursday 8:00 AM to 4:00 PM  Provider's Schedule: Milinda Pointer, MD:  Procedure days: Tuesday and Thursday 7:30 AM to 4:00 PM  Gillis Santa, MD:  Procedure days: Monday and Wednesday 7:30 AM to  4:00 PM ____________________________________________________________________________________________  Facet Blocks Patient Information  Description: The facets are joints in the spine between the vertebrae.  Like any joints in the body, facets can become irritated and  painful.  Arthritis can also effect the facets.  By injecting steroids and local anesthetic in and around these joints, we can temporarily block the nerve supply to them.  Steroids act directly on irritated nerves and tissues to reduce selling and inflammation which often leads to decreased pain.  Facet blocks may be done anywhere along the spine from the neck to the low back depending upon the location of your pain.   After numbing the skin with local anesthetic (like Novocaine), a small needle is passed onto the facet joints under x-ray guidance.  You may experience a sensation of pressure while this is being done.  The entire block usually lasts about 15-25 minutes.   Conditions which may be treated by facet blocks:   Low back/buttock pain  Neck/shoulder pain  Certain types of headaches  Preparation for the injection:  1. Do not eat any solid food or dairy products within 8 hours of your appointment. 2. You may drink clear liquid up to 3 hours before appointment.  Clear liquids include water, black coffee, juice or soda.  No milk or cream please. 3. You may take your regular medication, including pain medications, with a sip of water before your appointment.  Diabetics should hold regular insulin (if taken separately) and take 1/2 normal NPH dose the morning of the procedure.  Carry some sugar containing items with you to your appointment. 4. A driver must accompany you and be prepared to drive you home after your procedure. 5. Bring all your current medications with you. 6. An IV may be inserted and sedation may be given at the discretion of the physician. 7. A blood pressure cuff, EKG and other monitors will often be applied during the procedure.  Some patients may need to have extra oxygen administered for a short period. 8. You will be asked to provide medical information, including your allergies and medications, prior to the procedure.  We must know immediately if you are taking blood  thinners (like Coumadin/Warfarin) or if you are allergic to IV iodine contrast (dye).  We must know if you could possible be pregnant.  Possible side-effects:   Bleeding from needle site  Infection (rare, may require surgery)  Nerve injury (rare)  Numbness & tingling (temporary)  Difficulty urinating (rare, temporary)  Spinal headache (a headache worse with upright posture)  Light-headedness (temporary)  Pain at injection site (serveral days)  Decreased blood pressure (rare, temporary)  Weakness in arm/leg (temporary)  Pressure sensation in back/neck (temporary)   Call if you experience:   Fever/chills associated with headache or increased back/neck pain  Headache worsened by an upright position  New onset, weakness or numbness of an extremity below the injection site  Hives or difficulty breathing (go to the emergency room)  Inflammation or drainage at the injection site(s)  Severe back/neck pain greater than usual  New symptoms which are concerning to you  Please note:  Although the local anesthetic injected can often make your back or neck feel good for several hours after the injection, the pain will likely return. It takes 3-7 days for steroids to work.  You may not notice any pain relief for at least one week.  If effective, we will often do a series of 2-3 injections spaced 3-6 weeks apart to maximally decrease your  pain.  After the initial series, you may be a candidate for a more permanent nerve block of the facets.  If you have any questions, please call #[phone] [facility]

## 2017-11-13 NOTE — Progress Notes (Signed)
Patient's Name: Kenneth Osborn  MRN: [id number]  Referring Provider: Ellene Route  DOB: [birth year]  PCP: Ellene Route  DOS: 11/13/2017  Note by: Gaspar Cola, MD  Service setting: Ambulatory outpatient  Specialty: Interventional Pain Management  Patient type: Established  Location: [facility] (AMB) Pain Management Facility  Visit type: Interventional Procedure   Primary Reason for Visit: Interventional Pain Management Treatment. CC: Back Pain (low right at implant)  Procedure:          Anesthesia, Analgesia, Anxiolysis:  Type: Trigger Point Injection (1-2 muscle groups) #3  CPT: 20552 (Right side posterior superior iliac spine Trigger PointInjection) Primary Purpose: Palliative Region: Posterior Lumbosacral Level: Lumbosacral Target Area: Trigger Point Approach: Percutaneous, ipsilateral approach. Laterality: Right-Sided Paravertebral  Type: Local Anesthesia Indication(s): Analgesia         Local Anesthetic: Lidocaine 1-2% Route: Infiltration ([place]/IM) IV Access: Declined Sedation: Declined    Position: Jacknife   Indications: 1. Trigger point posterior superior iliac spine (PSIS) (Right)   2. Chronic musculoskeletal pain   3. Chronic low back pain (Bilateral) (L>R)    Pain Score: Pre-procedure: 4 /10 Post-procedure: 2 /10  Pre-op Assessment:  Kenneth Osborn is a 62 y.o. (year old), male patient, seen today for interventional treatment. He  has a past surgical history that includes Cardiovascular stress test; Cardiac catheterization; Coronary angioplasty; Labrinthectomy; mastoid shunt; Appendectomy; Back surgery; Shoulder arthroscopy with subacromial decompression (Left, 04/06/2012); ARTHRODESIS ANTERIOR ANTERIOR CERVICLE SPINE (01/04/2013); Colonoscopy with propofol (N/A, 09/19/2014); Esophagogastroduodenoscopy (N/A, 09/19/2014); Savory dilation (N/A, 09/19/2014); Spinal cord stimulator implant (Right); Cardiac catheterization (N/A, 06/15/2015); and Cardiac catheterization  (N/A, 06/15/2015). Kenneth Osborn has a current medication list which includes the following prescription(s): accu-chek aviva plus, alprazolam, aspirin ec, cetirizine, cyclobenzaprine, diazepam, diltiazem, docusate sodium, enalapril, esomeprazole, ezetimibe, gabapentin, humulin r u-500 kwikpen, magnesium oxide, meclizine, metformin, metoprolol succinate, montelukast, naloxone, niacin, fish oil, oxycodone hcl, oxycodone hcl, prasugrel, ranitidine, rosuvastatin, triamterene-hydrochlorothiazide, fenofibrate, and oxycodone hcl. His primarily concern today is the Back Pain (low right at implant)  Initial Vital Signs:  Pulse/HCG Rate: 84  Temp: 98.3 F (36.8 C) Resp: 18 BP: 97/68 SpO2: 96 %  BMI: Estimated body mass index is 33.97 kg/m as calculated from the following:   Height as of this encounter: 5' 9"  (1.753 m).   Weight as of this encounter: 230 lb (104.3 kg).  Risk Assessment: Allergies: Reviewed. He has No Known Allergies.  Allergy Precautions: None required Coagulopathies: Reviewed. None identified.  Blood-thinner therapy: None at this time Active Infection(s): Reviewed. None identified. Kenneth Osborn is afebrile  Site Confirmation: Kenneth Osborn was asked to confirm the procedure and laterality before marking the site Procedure checklist: Completed Consent: Before the procedure and under the influence of no sedative(s), amnesic(s), or anxiolytics, the patient was informed of the treatment options, risks and possible complications. To fulfill our ethical and legal obligations, as recommended by the American Medical Association's Code of Ethics, I have informed the patient of my clinical impression; the nature and purpose of the treatment or procedure; the risks, benefits, and possible complications of the intervention; the alternatives, including doing nothing; the risk(s) and benefit(s) of the alternative treatment(s) or procedure(s); and the risk(s) and benefit(s) of doing nothing. The patient was  provided information about the general risks and possible complications associated with the procedure. These may include, but are not limited to: failure to achieve desired goals, infection, bleeding, organ or nerve damage, allergic reactions, paralysis, and death. In addition, the patient was informed of those risks and complications  associated to the procedure, such as failure to decrease pain; infection; bleeding; organ or nerve damage with subsequent damage to sensory, motor, and/or autonomic systems, resulting in permanent pain, numbness, and/or weakness of one or several areas of the body; allergic reactions; (i.e.: anaphylactic reaction); and/or death. Furthermore, the patient was informed of those risks and complications associated with the medications. These include, but are not limited to: allergic reactions (i.e.: anaphylactic or anaphylactoid reaction(s)); adrenal axis suppression; blood sugar elevation that in diabetics may result in ketoacidosis or comma; water retention that in patients with history of congestive heart failure may result in shortness of breath, pulmonary edema, and decompensation with resultant heart failure; weight gain; swelling or edema; medication-induced neural toxicity; particulate matter embolism and blood vessel occlusion with resultant organ, and/or nervous system infarction; and/or aseptic necrosis of one or more joints. Finally, the patient was informed that Medicine is not an exact science; therefore, there is also the possibility of unforeseen or unpredictable risks and/or possible complications that may result in a catastrophic outcome. The patient indicated having understood very clearly. We have given the patient no guarantees and we have made no promises. Enough time was given to the patient to ask questions, all of which were answered to the patient's satisfaction. Kenneth Osborn has indicated that he wanted to continue with the procedure. Attestation: I, the ordering  provider, attest that I have discussed with the patient the benefits, risks, side-effects, alternatives, likelihood of achieving goals, and potential problems during recovery for the procedure that I have provided informed consent. Date  Time: 11/13/2017  8:38 AM  Pre-Procedure Preparation:  Monitoring: As per clinic protocol. Respiration, ETCO2, SpO2, BP, heart rate and rhythm monitor placed and checked for adequate function Safety Precautions: Patient was assessed for positional comfort and pressure points before starting the procedure. Time-out: I initiated and conducted the "Time-out" before starting the procedure, as per protocol. The patient was asked to participate by confirming the accuracy of the "Time Out" information. Verification of the correct person, site, and procedure were performed and confirmed by me, the nursing staff, and the patient. "Time-out" conducted as per Joint Commission's Universal Protocol (UP.01.01.01). Time: 0901  Description of Procedure:          Area Prepped: Entire             Region Prepping solution: ChloraPrep (2% chlorhexidine gluconate and 70% isopropyl alcohol) Safety Precautions: Aspiration looking for blood return was conducted prior to all injections. At no point did we inject any substances, as a needle was being advanced. No attempts were made at seeking any paresthesias. Safe injection practices and needle disposal techniques used. Medications properly checked for expiration dates. SDV (single dose vial) medications used. Description of the Procedure: Protocol guidelines were followed. The patient was placed in position over the fluoroscopy table. The target area was identified and the area prepped in the usual manner. Skin & deeper tissues infiltrated with local anesthetic. Appropriate amount of time allowed to pass for local anesthetics to take effect. The procedure needles were then advanced to the target area. Proper needle placement secured. Negative  aspiration confirmed. Solution injected in intermittent fashion, asking for systemic symptoms every 0.5cc of injectate. The needles were then removed and the area cleansed, making sure to leave some of the prepping solution back to take advantage of its long term bactericidal properties.  Vitals:   11/13/17 0836 11/13/17 0838 11/13/17 0906  BP:  97/68 106/62  Pulse: 84  81  Resp: 18  16  Temp: 98.3 F (36.8 C)    SpO2: 96%  98%  Weight: 230 lb (104.3 kg)    Height: 5' 9"  (1.753 m)      Start Time: 0901 hrs. End Time: 0903 hrs. Materials:  Needle(s) Type: Epidural needle Gauge: 25G Length: 1.5-in Medication(s): Please see orders for medications and dosing details.  Imaging Guidance:          Type of Imaging Technique: None used Indication(s): N/A Exposure Time: No patient exposure Contrast: None used. Fluoroscopic Guidance: N/A Ultrasound Guidance: N/A Interpretation: N/A  Antibiotic Prophylaxis:   Anti-infectives (From admission, onward)   None     Indication(s): None identified  Post-operative Assessment:  Post-procedure Vital Signs:  Pulse/HCG Rate: 81  Temp: 98.3 F (36.8 C) Resp: 16 BP: 106/62 SpO2: 98 %  EBL: None  Complications: No immediate post-treatment complications observed by team, or reported by patient.  Note: The patient tolerated the entire procedure well. A repeat set of vitals were taken after the procedure and the patient was kept under observation following institutional policy, for this type of procedure. Post-procedural neurological assessment was performed, showing return to baseline, prior to discharge. The patient was provided with post-procedure discharge instructions, including a section on how to identify potential problems. Should any problems arise concerning this procedure, the patient was given instructions to immediately contact us, at any time, without hesitation. In any case, we plan to contact the patient by telephone for a  follow-up status report regarding this interventional procedure.  Comments:  No additional relevant information.  Plan of Care   Possible POC:  Diagnostic right-sided lumbar facet block under fluoroscopic guidance and IV sedation (PRN)   Imaging Orders  No imaging studies ordered today    Procedure Orders     TRIGGER POINT INJECTION     LUMBAR FACET(MEDIAL BRANCH NERVE BLOCK) MBNB  Medications ordered for procedure: Meds ordered this encounter  Medications  . triamcinolone acetonide (KENALOG-40) injection 40 mg  . ropivacaine (PF) 2 mg/mL (0.2%) (NAROPIN) injection 4 mL   Medications administered: We administered triamcinolone acetonide and ropivacaine (PF) 2 mg/mL (0.2%).  See the medical record for exact dosing, route, and time of administration.  New Prescriptions   No medications on file   Disposition: Discharge home  Discharge Date & Time: 11/13/2017; 0910 hrs.   Physician-requested Follow-up: Return for post-procedure eval (2 wks), w/ Dionisio David, NP.  Future Appointments  Date Time Provider [place]  12/03/2017  8:30 AM Vevelyn Francois, NP [facility] None   Primary Care Physician: Ellene Route Location: [facility] Outpatient Pain Management Facility Note by: Gaspar Cola, MD Date: 11/13/2017; Time: 9:17 AM  Disclaimer:  Medicine is not an exact science. The only guarantee in medicine is that nothing is guaranteed. It is important to note that the decision to proceed with this intervention was based on the information collected from the patient. The Data and conclusions were drawn from the patient's questionnaire, the interview, and the physical examination. Because the information was provided in large part by the patient, it cannot be guaranteed that it has not been purposely or unconsciously manipulated. Every effort has been made to obtain as much relevant data as possible for this evaluation. It is important to note that the conclusions that  lead to this procedure are derived in large part from the available data. Always take into account that the treatment will also be dependent on availability of resources and existing treatment guidelines, considered by other Pain  Management Practitioners as being common knowledge and practice, at the time of the intervention. For Medico-Legal purposes, it is also important to point out that variation in procedural techniques and pharmacological choices are the acceptable norm. The indications, contraindications, technique, and results of the above procedure should only be interpreted and judged by a Board-Certified Interventional Pain Specialist with extensive familiarity and expertise in the same exact procedure and technique.

## 2017-11-14 ENCOUNTER — Telehealth: Payer: Self-pay

## 2017-11-14 NOTE — Telephone Encounter (Signed)
Post procedure phone call.  Patient states he is doing well.

## 2017-12-03 ENCOUNTER — Ambulatory Visit: Payer: Medicare Other | Attending: Nurse Practitioner | Admitting: Nurse Practitioner

## 2017-12-03 ENCOUNTER — Encounter: Payer: Self-pay | Admitting: Nurse Practitioner

## 2017-12-03 VITALS — BP 106/57 | HR 77 | Temp 98.1°F | Resp 16 | Ht 69.0 in | Wt 220.0 lb

## 2017-12-03 DIAGNOSIS — E1142 Type 2 diabetes mellitus with diabetic polyneuropathy: Secondary | ICD-10-CM | POA: Diagnosis not present

## 2017-12-03 DIAGNOSIS — M47812 Spondylosis without myelopathy or radiculopathy, cervical region: Secondary | ICD-10-CM

## 2017-12-03 DIAGNOSIS — G894 Chronic pain syndrome: Secondary | ICD-10-CM

## 2017-12-03 DIAGNOSIS — J449 Chronic obstructive pulmonary disease, unspecified: Secondary | ICD-10-CM | POA: Diagnosis not present

## 2017-12-03 DIAGNOSIS — M4722 Other spondylosis with radiculopathy, cervical region: Secondary | ICD-10-CM | POA: Insufficient documentation

## 2017-12-03 DIAGNOSIS — K2270 Barrett's esophagus without dysplasia: Secondary | ICD-10-CM | POA: Insufficient documentation

## 2017-12-03 DIAGNOSIS — G4730 Sleep apnea, unspecified: Secondary | ICD-10-CM | POA: Diagnosis not present

## 2017-12-03 DIAGNOSIS — N183 Chronic kidney disease, stage 3 (moderate): Secondary | ICD-10-CM | POA: Diagnosis not present

## 2017-12-03 DIAGNOSIS — I129 Hypertensive chronic kidney disease with stage 1 through stage 4 chronic kidney disease, or unspecified chronic kidney disease: Secondary | ICD-10-CM | POA: Insufficient documentation

## 2017-12-03 DIAGNOSIS — H8109 Meniere's disease, unspecified ear: Secondary | ICD-10-CM | POA: Diagnosis not present

## 2017-12-03 DIAGNOSIS — M5030 Other cervical disc degeneration, unspecified cervical region: Secondary | ICD-10-CM | POA: Diagnosis not present

## 2017-12-03 DIAGNOSIS — J309 Allergic rhinitis, unspecified: Secondary | ICD-10-CM | POA: Insufficient documentation

## 2017-12-03 DIAGNOSIS — Z8249 Family history of ischemic heart disease and other diseases of the circulatory system: Secondary | ICD-10-CM | POA: Insufficient documentation

## 2017-12-03 DIAGNOSIS — M7918 Myalgia, other site: Secondary | ICD-10-CM | POA: Insufficient documentation

## 2017-12-03 DIAGNOSIS — E1122 Type 2 diabetes mellitus with diabetic chronic kidney disease: Secondary | ICD-10-CM | POA: Diagnosis not present

## 2017-12-03 DIAGNOSIS — E1165 Type 2 diabetes mellitus with hyperglycemia: Secondary | ICD-10-CM | POA: Insufficient documentation

## 2017-12-03 DIAGNOSIS — I252 Old myocardial infarction: Secondary | ICD-10-CM | POA: Insufficient documentation

## 2017-12-03 DIAGNOSIS — K219 Gastro-esophageal reflux disease without esophagitis: Secondary | ICD-10-CM | POA: Diagnosis not present

## 2017-12-03 DIAGNOSIS — E785 Hyperlipidemia, unspecified: Secondary | ICD-10-CM | POA: Insufficient documentation

## 2017-12-03 DIAGNOSIS — M545 Low back pain: Secondary | ICD-10-CM | POA: Diagnosis not present

## 2017-12-03 DIAGNOSIS — Z5181 Encounter for therapeutic drug level monitoring: Secondary | ICD-10-CM | POA: Insufficient documentation

## 2017-12-03 DIAGNOSIS — M47816 Spondylosis without myelopathy or radiculopathy, lumbar region: Secondary | ICD-10-CM | POA: Diagnosis not present

## 2017-12-03 DIAGNOSIS — Z7982 Long term (current) use of aspirin: Secondary | ICD-10-CM | POA: Insufficient documentation

## 2017-12-03 DIAGNOSIS — I2510 Atherosclerotic heart disease of native coronary artery without angina pectoris: Secondary | ICD-10-CM | POA: Insufficient documentation

## 2017-12-03 DIAGNOSIS — Z79899 Other long term (current) drug therapy: Secondary | ICD-10-CM | POA: Diagnosis not present

## 2017-12-03 DIAGNOSIS — Z79891 Long term (current) use of opiate analgesic: Secondary | ICD-10-CM | POA: Insufficient documentation

## 2017-12-03 DIAGNOSIS — Z8669 Personal history of other diseases of the nervous system and sense organs: Secondary | ICD-10-CM

## 2017-12-03 DIAGNOSIS — M792 Neuralgia and neuritis, unspecified: Secondary | ICD-10-CM

## 2017-12-03 DIAGNOSIS — Z794 Long term (current) use of insulin: Secondary | ICD-10-CM | POA: Insufficient documentation

## 2017-12-03 MED ORDER — GABAPENTIN 800 MG PO TABS: 1600.0000 mg | ORAL_TABLET | Freq: Two times a day (BID) | ORAL | 2 refills | Status: DC

## 2017-12-03 MED ORDER — OXYCODONE HCL 10 MG PO TABS: 10.0000 mg | ORAL_TABLET | Freq: Four times a day (QID) | ORAL | 0 refills | Status: DC | PRN

## 2017-12-03 MED ORDER — CYCLOBENZAPRINE HCL 10 MG PO TABS: 10.0000 mg | ORAL_TABLET | Freq: Three times a day (TID) | ORAL | 0 refills | Status: DC | PRN

## 2017-12-03 NOTE — Patient Instructions (Addendum)
____________________________________________________________________________________________  Medication Rules  Applies to: All patients receiving prescriptions (written or electronic).  Pharmacy of record: Pharmacy where electronic prescriptions will be sent. If written prescriptions are taken to a different pharmacy, please inform the nursing staff. The pharmacy listed in the electronic medical record should be the one where you would like electronic prescriptions to be sent.  Prescription refills: Only during scheduled appointments. Applies to both, written and electronic prescriptions.  NOTE: The following applies primarily to controlled substances (Opioid* Pain Medications).   Patient's responsibilities: 1. Pain Pills: Bring all pain pills to every appointment (except for procedure appointments). 2. Pill Bottles: Bring pills in original pharmacy bottle. Always bring newest bottle. Bring bottle, even if empty. 3. Medication refills: You are responsible for knowing and keeping track of what medications you need refilled. The day before your appointment, write a list of all prescriptions that need to be refilled. Bring that list to your appointment and give it to the admitting nurse. Prescriptions will be written only during appointments. If you forget a medication, it will not be "Called in", "Faxed", or "electronically sent". You will need to get another appointment to get these prescribed. 4. Prescription Accuracy: You are responsible for carefully inspecting your prescriptions before leaving our office. Have the discharge nurse carefully go over each prescription with you, before taking them home. Make sure that your name is accurately spelled, that your address is correct. Check the name and dose of your medication to make sure it is accurate. Check the number of pills, and the written instructions to make sure they are clear and accurate. Make sure that you are given enough medication to last  until your next medication refill appointment. 5. Taking Medication: Take medication as prescribed. Never take more pills than instructed. Never take medication more frequently than prescribed. Taking less pills or less frequently is permitted and encouraged, when it comes to controlled substances (written prescriptions).  6. Inform other Doctors: Always inform, all of your healthcare providers, of all the medications you take. 7. Pain Medication from other Providers: You are not allowed to accept any additional pain medication from any other Doctor or Healthcare provider. There are two exceptions to this rule. (see below) In the event that you require additional pain medication, you are responsible for notifying us, as stated below. 8. Medication Agreement: You are responsible for carefully reading and following our Medication Agreement. This must be signed before receiving any prescriptions from our practice. Safely store a copy of your signed Agreement. Violations to the Agreement will result in no further prescriptions. (Additional copies of our Medication Agreement are available upon request.) 9. Laws, Rules, & Regulations: All patients are expected to follow all Federal and [employer], [employer], Rules, [employer]. Ignorance of the Laws does not constitute a valid excuse. The use of any illegal substances is prohibited. 10. Adopted CDC guidelines & recommendations: Target dosing levels will be at or below 60 MME/day. Use of benzodiazepines** is not recommended.  Exceptions: There are only two exceptions to the rule of not receiving pain medications from other Healthcare Providers. 1. Exception #1 (Emergencies): In the event of an emergency (i.e.: accident requiring emergency care), you are allowed to receive additional pain medication. However, you are responsible for: As soon as you are able, call our office [phone], at any time of the day or night, and leave a message stating your name, the  date and nature of the emergency, and the name and dose of the medication  prescribed. In the event that your call is answered by a member of our staff, make sure to document and save the date, time, and the name of the person that took your information.  2. Exception #2 (Planned Surgery): In the event that you are scheduled by another doctor or dentist to have any type of surgery or procedure, you are allowed (for a period no longer than 30 days), to receive additional pain medication, for the acute post-op pain. However, in this case, you are responsible for picking up a copy of our "Post-op Pain Management for Surgeons" handout, and giving it to your surgeon or dentist. This document is available at our office, and does not require an appointment to obtain it. Simply go to our office during business hours (Monday-Thursday from 8:00 AM to 4:00 PM) (Friday 8:00 AM to 12:00 Noon) or if you have a scheduled appointment with [name], prior to your surgery, and ask for it by name. In addition, you will need to provide [name] with your name, name of your surgeon, type of surgery, and date of procedure or surgery.  *Opioid medications include: morphine, codeine, oxycodone, oxymorphone, hydrocodone, hydromorphone, meperidine, tramadol, tapentadol, buprenorphine, fentanyl, methadone. **Benzodiazepine medications include: diazepam (Valium), alprazolam (Xanax), clonazepam (Klonopine), lorazepam (Ativan), clorazepate (Tranxene), chlordiazepoxide (Librium), estazolam (Prosom), oxazepam (Serax), temazepam (Restoril), triazolam (Halcion) (Last updated: 04/17/2017) ____________________________________________________________________________________________    BMI Assessment: Estimated body mass index is 32.49 kg/m as calculated from the following:   Height as of this encounter: 5' 9"  (1.753 m).   Weight as of this encounter: 220 lb (99.8 kg).  BMI interpretation table: BMI level Category Range association with higher  incidence of chronic pain  <18 kg/m2 Underweight   18.5-24.9 kg/m2 Ideal body weight   25-29.9 kg/m2 Overweight Increased incidence by 20%  30-34.9 kg/m2 Obese (Class I) Increased incidence by 68%  35-39.9 kg/m2 Severe obesity (Class II) Increased incidence by 136%  >40 kg/m2 Extreme obesity (Class III) Increased incidence by 254%   Patient's current BMI Ideal Body weight  Body mass index is 32.49 kg/m. Ideal body weight: 70.7 kg (155 lb 13.8 oz) Adjusted ideal body weight: 82.3 kg (181 lb 8.3 oz)   BMI Readings from Last 4 Encounters:  12/03/17 32.49 kg/m  11/13/17 33.97 kg/m  09/11/17 33.67 kg/m  06/10/17 32.80 kg/m   Wt Readings from Last 4 Encounters:  12/03/17 220 lb (99.8 kg)  11/13/17 230 lb (104.3 kg)  09/11/17 228 lb (103.4 kg)  06/10/17 222 lb 1.6 oz (100.7 kg)    Flexeril 10 mg x 3 months, gabapentin x 3 months and oxycodone hcl 10 mg x 3 months to begin filling on 12/31/17 escribed to your pharmacy

## 2017-12-03 NOTE — Progress Notes (Signed)
Patient's Name: Kenneth Osborn  MRN: [id number]  Referring Provider: Ellene Route  DOB: [birth year]  PCP: Ellene Route  DOS: 12/03/2017  Note by: Vevelyn Francois NP  Service setting: Ambulatory outpatient  Specialty: Interventional Pain Management  Location: [facility] (AMB) Pain Management Facility    Patient type: Established    Primary Reason(s) for Visit: Encounter for prescription drug management & post-procedure evaluation of chronic illness with mild to moderate exacerbation(Level of risk: moderate) CC: Back Pain (lumbar bilateral )  HPI  Kenneth Osborn is a 62 y.o. year old, male patient, who comes today for a post-procedure evaluation and medication management. 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 Beverely Pace at [facility] on 01/04/2013); Long term current use of opiate analgesic; Long term prescription opiate use; Opiate use (60 MME/Day); Opiate dependence ([place]); Encounter for therapeutic drug level monitoring; Neurogenic pain; Thoracic facet syndrome (T8-10); Lumbar facet syndrome (Bilateral) (R>L); Cervical facet syndrome (Right); Cervical spondylosis; Lumbar spondylosis; Chronic upper extremity pain (Left); Chronic cervical radicular pain (Left); Chronic upper back pain; History of thoracic spine surgery (S/P T9-10 IVD spacer); Failed back surgical syndrome; Chronic musculoskeletal pain; Myofascial pain; Chronic lower extremity pain (Left); Chronic radicular lumbar pain (left L4 dermatomal pain); Coronary artery disease; History of MI (myocardial infarction) (May 2012); Chronic anticoagulation  ([place]); History of Meniere's disease; Sleep apnea; Type 2 diabetes mellitus with hyperglycemia ([place]); Encounter for interrogation of neurostimulator; [employer] spinal cord stimulator (implant date: 11/01/2014); Unstable angina (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); Bilateral carotid artery stenosis; Uncontrolled type 2 diabetes mellitus with hyperglycemia, with long-term current use of insulin ([place]); DM type 2 with diabetic peripheral neuropathy ([place]); Chronic pain syndrome; Numbness and tingling; Tachycardia with heart rate 100-120 beats per minute; Tremor; Meniere disease, bilateral; CKD (chronic kidney disease) stage 3, GFR 30-59 ml/min ([place]); Headache disorder; Type 2 diabetes mellitus with diabetic neuropathy ([place]); Trigger point posterior superior iliac spine (PSIS) (Right); 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); Hyperlipidemia associated with type 2 diabetes mellitus ([place]); and Spondylosis without myelopathy or radiculopathy, lumbosacral region on their problem list. His primarily concern today is the Back Pain (lumbar bilateral )  Pain Assessment: Location: Lower, Left, Right Back Radiating: into hips and legs to around the back of the knees  Onset: More than a month ago Duration: Chronic pain Quality: Discomfort, Sharp, Spasm, Constant Severity: 4 /10 (subjective, self-reported  pain score)  Note: Reported level is compatible with observation.                          Effect on ADL: difficulty lifting and moving anything such as a gallon of milk from store to car  Timing: Constant(has spinal cord stimulator, when the battery is low the pain is constant and while  he is charging the pain is constant) Modifying factors: spinal cord stimulator.  medications. rest  BP: (!) 106/57  HR: 77  Kenneth Osborn was last seen on 11/13/2017 for a procedure. During today's appointment we reviewed Kenneth Osborn's post-procedure results, as well as his outpatient medication regimen.  He admits that he does not feel like his spinal cord stimulator is effective.  He is going to call the manufacturer's.  He is consider having this removed.  He admits that the trigger point injections was not effective as it was in the past.  He admits that he may try to do some additional procedures i.e. lumbar facet nerve blocks.  He admits that he had these in the past before he had his heart attack they worked well.  He admits that he desires to lose weight and be more active but is limited secondary to his surgeries and his pain.  He also admits that he has been treated by ENT at [facility] for many years however the doctor is getting ready to retire he has seen Dr. Tami Ribas in the past and would like to return there for his Mnire's disease.  He admits that is just maintenance that he goes annually.  He is also requesting an handicap decal renewal.   Further details on both, my assessment(s), as well as the proposed treatment plan, please see below.  Controlled Substance Pharmacotherapy Assessment REMS (Risk Evaluation and Mitigation Strategy)  Analgesic:Oxycodone IR 10 mg every 6 hours (40 mg/day) MME/day:60 mg/day Janett Billow, RN  12/03/2017  8:55 AM  Sign at close encounter Nursing Pain Medication Assessment:  Safety precautions to be maintained throughout the outpatient stay will include: orient to  surroundings, keep bed in low position, maintain call bell within reach at all times, provide assistance with transfer out of bed and ambulation.  Medication Inspection Compliance: Pill count conducted under aseptic conditions, in front of the patient. Neither the pills nor the bottle was removed from the patient's sight at any time. Once count was completed pills were immediately returned to the patient in their original bottle.  Medication: Oxycodone IR Pill/Patch Count: 114 of 120 pills remain Pill/Patch Appearance: Markings consistent with prescribed medication Bottle Appearance: Standard pharmacy container. Clearly labeled. Filled Date: 10 / 14 / 2019 Last Medication intake:  Today   Pharmacokinetics: Liberation and absorption (onset of action): WNL Distribution (time to peak effect): WNL Metabolism and excretion (duration of action): WNL         Pharmacodynamics: Desired effects: Analgesia: Mr. Winkels reports >50% benefit. Functional ability: Patient reports that medication allows him to accomplish basic ADLs Clinically meaningful improvement in function (CMIF): Sustained CMIF goals met Perceived effectiveness: Described as relatively effective, allowing for increase in activities of Osborn living (ADL) Undesirable effects: Side-effects or Adverse reactions: None reported Monitoring: [place] PMP: Online review of the past 58-monthperiod conducted. Compliant with practice rules and regulations Last UDS on record: Summary  Date Value Ref Range Status  05/27/2017 FINAL  Final    Comment:    ==================================================================== TOXASSURE SELECT 13 (MW) ==================================================================== Test                             Result       Flag       Units Drug Present and Declared for Prescription Verification   Desmethyldiazepam              177  EXPECTED   ng/mg creat   Oxazepam                       766           EXPECTED   ng/mg creat   Temazepam                      331          EXPECTED   ng/mg creat    Desmethyldiazepam, oxazepam, and temazepam are expected    metabolites of diazepam. Desmethyldiazepam and oxazepam are also    expected metabolites of other drugs, including chlordiazepoxide,    prazepam, clorazepate, and halazepam. Oxazepam is an expected    metabolite of temazepam. Oxazepam and temazepam are also    available as scheduled prescription medications.   Oxycodone                      1616         EXPECTED   ng/mg creat   Oxymorphone                    5968         EXPECTED   ng/mg creat   Noroxycodone                   1338         EXPECTED   ng/mg creat   Noroxymorphone                 2197         EXPECTED   ng/mg creat    Sources of oxycodone are scheduled prescription medications.    Oxymorphone, noroxycodone, and noroxymorphone are expected    metabolites of oxycodone. Oxymorphone is also available as a    scheduled prescription medication. Drug Absent but Declared for Prescription Verification   Alprazolam                     Not Detected UNEXPECTED ng/mg creat ==================================================================== Test                      Result    Flag   Units      Ref Range   Creatinine              115              mg/dL      >=20 ==================================================================== Declared Medications:  The flagging and interpretation on this report are based on the  following declared medications.  Unexpected results may arise from  inaccuracies in the declared medications.  **Note: The testing scope of this panel includes these medications:  Alprazolam (Xanax)  Diazepam (Valium)  Oxycodone  **Note: The testing scope of this panel does not include following  reported medications:  Aspirin  Cetirizine (Zyrtec)  Cyclobenzaprine  Diltiazem (Cardizem)  Docusate (Colace)  Enalapril (Vasotec)  Ezetimibe (Zetia)  Gabapentin   Hydrochlorothiazide (Maxzide)  Insulin (Humulin)  Magnesium  Magnesium (Mag-Ox)  Meclizine (Antivert)  Metformin  Methylprednisolone (Medrol Dose Pack)  Metoprolol  Montelukast (Singulair)  Naloxone (Narcan)  Niacin (Niaspan)  Omega-3 Fatty Acids (Fish Oil)  Omeprazole (Nexium)  Prasugrel (Effient)  Ranitidine (Zantac)  Rosuvastatin (Crestor)  Triamterene (Maxzide) ==================================================================== For clinical consultation, please call [phone]. ====================================================================    UDS interpretation: Compliant          Medication Assessment  Form: Reviewed. Patient indicates being compliant with therapy Treatment compliance: Compliant Risk Assessment Profile: Aberrant behavior: See prior evaluations. None observed or detected today Comorbid factors increasing risk of overdose: See prior notes. No additional risks detected today Opioid risk tool (ORT) (Total Score):   Personal History of Substance Abuse (SUD-Substance use disorder):  Alcohol:    Illegal Drugs:    Rx Drugs:    ORT Risk Level calculation:   Risk of substance use disorder (SUD): Low  ORT Scoring interpretation table:  Score <3 = Low Risk for SUD  Score between 4-7 = Moderate Risk for SUD  Score >8 = High Risk for Opioid Abuse   Risk Mitigation Strategies:  Patient Counseling: Covered Patient-Prescriber Agreement (PPA): Present and active  Notification to other healthcare providers: Done  Pharmacologic Plan: No change in therapy, at this time.             Post-Procedure Assessment  11/13/2017 Procedure: Trigger Point  Pre-procedure pain score:  4/10 Post-procedure pain score: 2/10         Influential Factors: BMI: 32.49 kg/m Intra-procedural challenges: None observed.         Assessment challenges: None detected.              Reported side-effects: None.        Post-procedural adverse reactions or complications: None  reported         Sedation: Please see nurses note. When no sedatives are used, the analgesic levels obtained are directly associated to the effectiveness of the local anesthetics. However, when sedation is provided, the level of analgesia obtained during the initial 1 hour following the intervention, is believed to be the result of a combination of factors. These factors may include, but are not limited to: 1. The effectiveness of the local anesthetics used. 2. The effects of the analgesic(s) and/or anxiolytic(s) used. 3. The degree of discomfort experienced by the patient at the time of the procedure. 4. The patients ability and reliability in recalling and recording the events. 5. The presence and influence of possible secondary gains and/or psychosocial factors. Reported result: Relief experienced during the 1st hour after the procedure: 100 % (Ultra-Short Term Relief)            Interpretative annotation: Clinically appropriate result. Analgesia during this period is likely to be Local Anesthetic and/or IV Sedative (Analgesic/Anxiolytic) related.          Effects of local anesthetic: The analgesic effects attained during this period are directly associated to the localized infiltration of local anesthetics and therefore cary significant diagnostic value as to the etiological location, or anatomical origin, of the pain. Expected duration of relief is directly dependent on the pharmacodynamics of the local anesthetic used. Long-acting (4-6 hours) anesthetics used.  Reported result: Relief during the next 4 to 6 hour after the procedure: 100 % (Short-Term Relief)            Interpretative annotation: Clinically appropriate result. Analgesia during this period is likely to be Local Anesthetic-related.          Long-term benefit: Defined as the period of time past the expected duration of local anesthetics (1 hour for short-acting and 4-6 hours for long-acting). With the possible exception of prolonged  sympathetic blockade from the local anesthetics, benefits during this period are typically attributed to, or associated with, other factors such as analgesic sensory neuropraxia, antiinflammatory effects, or beneficial biochemical changes provided by agents other than the local anesthetics.  Reported result: Extended  relief following procedure: 100 %(pain relief was good for approx 3 - 4 days and then he could not tell a difference. ) (Long-Term Relief)            Interpretative annotation: Clinically possible results. Good relief. No permanent benefit expected. Inflammation plays a part in the etiology to the pain.          Current benefits: Defined as reported results that persistent at this point in time.   Analgesia: <50 %            Function: Back to baseline ROM: Back to baseline Interpretative annotation: Recurrence of symptoms. No permanent benefit expected. Effective diagnostic intervention.           Interpretation: Results would suggest a successful diagnostic intervention.                  Plan:  Re-structuring of treatment plan.                Laboratory Chemistry  Inflammation Markers (CRP: Acute Phase) (ESR: Chronic Phase) Lab Results  Component Value Date   CRP 1.4 (H) 03/22/2015   ESRSEDRATE 9 03/22/2015                         Rheumatology Markers No results found for: RF, ANA, LABURIC, URICUR, LYMEIGGIGMAB, LYMEABIGMQN, HLAB27                      Renal Function Markers Lab Results  Component Value Date   BUN 20 06/16/2015   CREATININE 1.24 06/16/2015   GFRAA >60 06/16/2015   GFRNONAA >60 06/16/2015                             Hepatic Function Markers Lab Results  Component Value Date   AST 27 03/22/2015   ALT 34 03/22/2015   ALBUMIN 4.1 03/22/2015   ALKPHOS 84 03/22/2015                        Electrolytes Lab Results  Component Value Date   NA 139 06/16/2015   K 4.7 06/16/2015   CL 107 06/16/2015   CALCIUM 9.5 06/16/2015   MG 1.7 03/22/2015                         Neuropathy Markers No results found for: VITAMINB12, FOLATE, HGBA1C, HIV                      CNS Tests No results found for: COLORCSF, APPEARCSF, RBCCOUNTCSF, WBCCSF, POLYSCSF, LYMPHSCSF, EOSCSF, PROTEINCSF, GLUCCSF, JCVIRUS, CSFOLI, IGGCSF                      Bone Pathology Markers No results found for: VD25OH, [id number], [id number], [id number], 25OHVITD1, 25OHVITD2, 25OHVITD3, TESTOFREE, TESTOSTERONE                       Coagulation Parameters Lab Results  Component Value Date   INR 1.0 02/23/2014   LABPROT 12.9 02/23/2014   APTT 25.9 08/18/2013   PLT 180 06/16/2015                        Cardiovascular Markers Lab Results  Component Value Date   CKTOTAL 155 02/23/2014   CKMB 3.3 02/23/2014  TROPONINI < 0.02 02/23/2014   HGB 13.1 06/16/2015   HCT 38.5 (L) 06/16/2015                         CA Markers No results found for: CEA, CA125, LABCA2                      Note: Lab results reviewed.  Recent Diagnostic Imaging Results  DG Lumbar Spine Complete W/Bend CLINICAL DATA:  Ionic upper lower back pain. Status post spinal stimulator placement in April 2018  EXAM: [place]  COMPARISON:  MRI of the lumbar spine of March 12, 2013  FINDINGS: Nerve stimulator electrodes enter the posterior paraspinous soft tissues at L 3 4 and extends superiorly to approximately T9 through T12. The lumbar vertebral bodies are preserved in height. There is spina bifida occulta at L5 and S1. There is no spondylolisthesis. There are no pars defects. The disc space heights are well maintained. There is calcification in the wall of the abdominal aorta.  IMPRESSION: There is no acute bony abnormality of the lumbar spine. There is no significant degenerative disc disease and there is no spondylolisthesis.  Electronically Signed   By: David  Martinique M.D.   On: 12/26/2016 13:00 DG Thoracic Spine 2 View CLINICAL DATA:  Upper back  pain, lower back pain, no injury  EXAM: THORACIC SPINE 2 VIEWS  COMPARISON:  Chest x-ray of 08/18/2013  FINDINGS: The thoracic vertebrae are in normal alignment. Intervertebral disc spaces appear normal. Interbody fusion plug is Breath and T8-9. Neurostimulator electrode leads overlie the mid lower thoracic spinal canal. No compression deformity is seen. No prominent paravertebral soft tissue is noted. Lower anterior cervical spine fusion plate is present which appears to be at C6-7  IMPRESSION: No acute abnormality.  Normal alignment.  Electronically Signed   By: Ivar Drape M.D.   On: 12/26/2016 13:00  Complexity Note: Imaging results reviewed. Results shared with Kenneth Osborn, using Layman's terms.                         Meds   Current Outpatient Medications:  .  ACCU-CHEK AVIVA PLUS test strip, , Disp: , Rfl: 2 .  ALPRAZolam (XANAX) 0.5 MG tablet, Take 0.5 mg by mouth at bedtime as needed (Meniere's disease). , Disp: , Rfl:  .  aspirin EC 81 MG tablet, Take 81 mg by mouth at bedtime. , Disp: , Rfl:  .  cetirizine (ZYRTEC) 10 MG tablet, Take 10 mg by mouth Osborn., Disp: , Rfl:  .  cyclobenzaprine (FLEXERIL) 10 MG tablet, Take 1 tablet (10 mg total) by mouth every 8 (eight) hours as needed for muscle spasms., Disp: 270 tablet, Rfl: 0 .  diazepam (VALIUM) 5 MG tablet, Take 5 mg by mouth 3 (three) times Osborn as needed (Meniere's disease). , Disp: , Rfl:  .  diltiazem (CARTIA XT) 240 MG 24 hr capsule, Take 240 mg by mouth at bedtime., Disp: , Rfl:  .  docusate sodium (COLACE) 100 MG capsule, Take 100 mg by mouth Osborn., Disp: , Rfl:  .  enalapril (VASOTEC) 10 MG tablet, Take 10 mg by mouth 2 (two) times Osborn., Disp: , Rfl:  .  esomeprazole (NEXIUM) 40 MG capsule, Take 40 mg by mouth 2 (two) times Osborn., Disp: , Rfl:  .  ezetimibe (ZETIA) 10 MG tablet, Take 10 mg by mouth Osborn., Disp: ,  Rfl:  .  gabapentin (NEURONTIN) 800 MG tablet, Take 2 tablets (1,600 mg total) by mouth 2  (two) times Osborn., Disp: 120 tablet, Rfl: 2 .  HUMULIN R U-500 KWIKPEN 500 UNIT/ML kwikpen, 70 Units. , Disp: , Rfl: 0 .  Magnesium Oxide 500 MG TABS, Take by mouth., Disp: , Rfl:  .  meclizine (ANTIVERT) 25 MG tablet, Take 25 mg by mouth 3 (three) times Osborn as needed for dizziness. , Disp: , Rfl:  .  metFORMIN (GLUCOPHAGE) 1000 MG tablet, Take 1,000 mg by mouth 2 (two) times Osborn with a meal., Disp: , Rfl:  .  metoprolol succinate (TOPROL-XL) 50 MG 24 hr tablet, Take 50 mg by mouth 2 (two) times Osborn. Take with or immediately following a meal. , Disp: , Rfl:  .  montelukast (SINGULAIR) 10 MG tablet, Take by mouth Osborn. , Disp: , Rfl:  .  naloxone (NARCAN) 2 MG/2ML injection, Inject content of syringe into thigh muscle. Call 911., Disp: 2 Syringe, Rfl: 1 .  niacin (NIASPAN) 500 MG CR tablet, Take 500 mg by mouth at bedtime., Disp: , Rfl:  .  Omega-3 Fatty Acids (FISH OIL) 1000 MG CAPS, Take 1,200 mg by mouth 2 (two) times Osborn. , Disp: , Rfl:  .  [START ON 03/01/2018] Oxycodone HCl 10 MG TABS, Take 1 tablet (10 mg total) by mouth every 6 (six) hours as needed., Disp: 120 tablet, Rfl: 0 .  prasugrel (EFFIENT) 10 MG TABS, Take 10 mg by mouth Osborn., Disp: , Rfl:  .  ranitidine (ZANTAC) 150 MG tablet, Take 150 mg by mouth at bedtime. , Disp: , Rfl:  .  rosuvastatin (CRESTOR) 20 MG tablet, Take 20 mg by mouth at bedtime., Disp: , Rfl:  .  triamterene-hydrochlorothiazide (MAXZIDE) 75-50 MG tablet, Take 1 tablet by mouth Osborn. , Disp: , Rfl:  .  [START ON 01/30/2018] Oxycodone HCl 10 MG TABS, Take 1 tablet (10 mg total) by mouth every 6 (six) hours as needed., Disp: 120 tablet, Rfl: 0 .  [START ON 12/31/2017] Oxycodone HCl 10 MG TABS, Take 1 tablet (10 mg total) by mouth every 6 (six) hours as needed., Disp: 120 tablet, Rfl: 0  ROS  Constitutional: Denies any fever or chills Gastrointestinal: No reported hemesis, hematochezia, vomiting, or acute GI distress Musculoskeletal: Denies any acute  onset joint swelling, redness, loss of ROM, or weakness Neurological: No reported episodes of acute onset apraxia, aphasia, dysarthria, agnosia, amnesia, paralysis, loss of coordination, or loss of consciousness  Allergies  Kenneth Osborn has No Known Allergies.  PFSH  Drug: Kenneth Osborn  reports that he does not use drugs. Alcohol:  reports that he does not drink alcohol. Tobacco:  reports that he has never smoked. He has never used smokeless tobacco. Medical:  has a past medical history of Allergic rhinitis (12/30/2012), Bronchitis, Can't get food down (08/12/2014), Chronic back pain, Concussion (09/2015), COPD (chronic obstructive pulmonary disease) ([place]), Coronary artery disease, DDD (degenerative disc disease), cervical, Dehydration symptoms, Diabetes mellitus without complication ([place]), Dysphagia, GERD (gastroesophageal reflux disease), History of Meniere's disease (12/21/2014), History of thoracic spine surgery (S/P T9-10 IVD spacer) (12/21/2014), Hypercholesteremia, Hyperlipidemia, Hypertension, Meniere's disease, Myocardial infarction ([place]), Neuromuscular disorder ([place]), Psychosis ([place]), Short-segment Barrett's esophagus, and Sleep apnea. Surgical: Kenneth Osborn  has a past surgical history that includes Cardiovascular stress test; Cardiac catheterization; Coronary angioplasty; Labrinthectomy; mastoid shunt; Appendectomy; Back surgery; Shoulder arthroscopy with subacromial decompression (Left, 04/06/2012); ARTHRODESIS ANTERIOR ANTERIOR CERVICLE SPINE (01/04/2013); Colonoscopy with propofol (N/A, 09/19/2014); Esophagogastroduodenoscopy (N/A,  09/19/2014); Savory dilation (N/A, 09/19/2014); Spinal cord stimulator implant (Right); Cardiac catheterization (N/A, 06/15/2015); and Cardiac catheterization (N/A, 06/15/2015). Family: family history includes Cancer in his sister; Diabetes in his maternal grandmother, mother, and paternal grandmother; Heart disease in his father, maternal aunt, maternal uncle, and  mother.  Constitutional Exam  General appearance: cooperative, somnolent and mildly obese Vitals:   12/03/17 0842  BP: (!) 106/57  Pulse: 77  Resp: 16  Temp: 98.1 F (36.7 C)  TempSrc: Oral  SpO2: 97%  Weight: 220 lb (99.8 kg)  Height: 5' 9"  (1.753 m)  Psych/Mental status: Alert, oriented x 3 (person, place, & time)       Eyes: PERLA Respiratory: No evidence of acute respiratory distress  Cervical Spine Area Exam  Skin & Axial Inspection: No masses, redness, edema, swelling, or associated skin lesions Alignment: Symmetrical Functional ROM: Unrestricted ROM      Stability: No instability detected Muscle Tone/Strength: Functionally intact. No obvious neuro-muscular anomalies detected. Sensory (Neurological): Unimpaired Palpation: No palpable anomalies              Lumbar Spine Area Exam  Skin & Axial Inspection: No masses, redness, or swelling Alignment: Symmetrical Functional ROM: Unrestricted ROM       Stability: No instability detected Muscle Tone/Strength: Functionally intact. No obvious neuro-muscular anomalies detected. Sensory (Neurological): Unimpaired Palpation: No palpable anomalies       Provocative Tests: Hyperextension/rotation test: deferred today       Lumbar quadrant test (Kemp's test): deferred today       Lateral bending test: deferred today       Patrick's Maneuver: deferred today                   FABER test: deferred today                   S-I anterior distraction/compression test: deferred today         S-I lateral compression test: deferred today         S-I Thigh-thrust test: deferred today         S-I Gaenslen's test: deferred today          Gait & Posture Assessment  Ambulation: Unassisted Gait: Relatively normal for age and body habitus Posture: WNL   Lower Extremity Exam    Side: Right lower extremity  Side: Left lower extremity  Stability: No instability observed          Stability: No instability observed          Skin & Extremity  Inspection: Skin color, temperature, and hair growth are WNL. No peripheral edema or cyanosis. No masses, redness, swelling, asymmetry, or associated skin lesions. No contractures.  Skin & Extremity Inspection: Skin color, temperature, and hair growth are WNL. No peripheral edema or cyanosis. No masses, redness, swelling, asymmetry, or associated skin lesions. No contractures.  Functional ROM: Unrestricted ROM                  Functional ROM: Unrestricted ROM                  Muscle Tone/Strength: Functionally intact. No obvious neuro-muscular anomalies detected.  Muscle Tone/Strength: Functionally intact. No obvious neuro-muscular anomalies detected.  Sensory (Neurological): Unimpaired  Sensory (Neurological): Unimpaired  Palpation: No palpable anomalies  Palpation: No palpable anomalies   Assessment  Primary Diagnosis & Pertinent Problem List: The primary encounter diagnosis was Lumbar spondylosis. Diagnoses of DM type 2 with diabetic peripheral neuropathy ([place]),  Cervical spondylosis, Musculoskeletal pain, Neuropathic pain, History of Meniere's disease, and Chronic pain syndrome were also pertinent to this visit.  Status Diagnosis  Controlled Controlled Controlled 1. Lumbar spondylosis   2. DM type 2 with diabetic peripheral neuropathy ([facility])   3. Cervical spondylosis   4. Musculoskeletal pain   5. Neuropathic pain   6. History of Meniere's disease   7. Chronic pain syndrome     Problems updated and reviewed during this visit: No problems updated. Plan of Care  Pharmacotherapy (Medications Ordered): Meds ordered this encounter  Medications  . cyclobenzaprine (FLEXERIL) 10 MG tablet    Sig: Take 1 tablet (10 mg total) by mouth every 8 (eight) hours as needed for muscle spasms.    Dispense:  270 tablet    Refill:  0    Do not add this medication to the electronic "Automatic Refill" notification system. Patient may have prescription filled one day early if pharmacy is closed on  scheduled refill date.    Order Specific Question:   Supervising Provider    Answer:   Milinda Pointer [phone]  . Oxycodone HCl 10 MG TABS    Sig: Take 1 tablet (10 mg total) by mouth every 6 (six) hours as needed.    Dispense:  120 tablet    Refill:  0    Do not place this medication, or any other prescription from our practice, on "Automatic Refill". Patient may have prescription filled one day early if pharmacy is closed on scheduled refill date.    Order Specific Question:   Supervising Provider    Answer:   Milinda Pointer [phone]  . Oxycodone HCl 10 MG TABS    Sig: Take 1 tablet (10 mg total) by mouth every 6 (six) hours as needed.    Dispense:  120 tablet    Refill:  0    Do not place this medication, or any other prescription from our practice, on "Automatic Refill". Patient may have prescription filled one day early if pharmacy is closed on scheduled refill date.    Order Specific Question:   Supervising Provider    Answer:   Milinda Pointer [phone]  . Oxycodone HCl 10 MG TABS    Sig: Take 1 tablet (10 mg total) by mouth every 6 (six) hours as needed.    Dispense:  120 tablet    Refill:  0    Do not place this medication, or any other prescription from our practice, on "Automatic Refill". Patient may have prescription filled one day early if pharmacy is closed on scheduled refill date.    Order Specific Question:   Supervising Provider    Answer:   Milinda Pointer [phone]  . gabapentin (NEURONTIN) 800 MG tablet    Sig: Take 2 tablets (1,600 mg total) by mouth 2 (two) times Osborn.    Dispense:  120 tablet    Refill:  2    Do not place this medication, or any other prescription from our practice, on "Automatic Refill". Patient may have prescription filled one day early if pharmacy is closed on scheduled refill date. (3 mo refill)    Order Specific Question:   Supervising Provider    Answer:   Milinda Pointer [phone]   New Prescriptions   No medications on  file   Medications administered today: Kenneth Osborn had no medications administered during this visit. Lab-work, procedure(s), and/or referral(s): No orders of the defined types were placed in this encounter.  Imaging and/or referral(s): None  Interventional therapies: Planned, scheduled, and/or pending: None at this time.He will call for procedure in the future for lumbar facet.  Instructed that handicap decals were to be obtained by primary care.  Encourage patient to call spinal cord stimulator manufacturer.  Did inform patient that I would try to get him a referral to ENT Dr. Tami Ribas.   Considering: None at this time.   Palliative PRN treatment(s): Right side posterior superior iliac spine Trigger PointInjection Diagnostic right-sided lumbar facet block under fluoroscopic guidance and IV sedation (PRN)    Provider-requested follow-up: Return in about 15 weeks (around 03/18/2018) for MedMgmt.  Future Appointments  Date Time Provider [place]  03/18/2018  8:30 AM Vevelyn Francois, NP [facility] None   Primary Care Physician: Ellene Route Location: [facility] Note by: Vevelyn Francois NP Date: 12/03/2017; Time: 1:38 PM  Pain Score Disclaimer: We use the NRS-11 scale. This is a self-reported, subjective measurement of pain severity with only modest accuracy. It is used primarily to identify changes within a particular patient. It must be understood that outpatient pain scales are significantly less accurate that those used for research, where they can be applied under ideal controlled circumstances with minimal exposure to variables. In reality, the score is likely to be a combination of pain intensity and pain affect, where pain affect describes the degree of emotional arousal or changes in action readiness caused by the sensory experience of pain. Factors such as social and work situation, setting, emotional state, anxiety  levels, expectation, and prior pain experience may influence pain perception and show large inter-individual differences that may also be affected by time variables.  Patient instructions provided during this appointment: Patient Instructions   ____________________________________________________________________________________________  Medication Rules  Applies to: All patients receiving prescriptions (written or electronic).  Pharmacy of record: Pharmacy where electronic prescriptions will be sent. If written prescriptions are taken to a different pharmacy, please inform the nursing staff. The pharmacy listed in the electronic medical record should be the one where you would like electronic prescriptions to be sent.  Prescription refills: Only during scheduled appointments. Applies to both, written and electronic prescriptions.  NOTE: The following applies primarily to controlled substances (Opioid* Pain Medications).   Patient's responsibilities: 1. Pain Pills: Bring all pain pills to every appointment (except for procedure appointments). 2. Pill Bottles: Bring pills in original pharmacy bottle. Always bring newest bottle. Bring bottle, even if empty. 3. Medication refills: You are responsible for knowing and keeping track of what medications you need refilled. The day before your appointment, write a list of all prescriptions that need to be refilled. Bring that list to your appointment and give it to the admitting nurse. Prescriptions will be written only during appointments. If you forget a medication, it will not be "Called in", "Faxed", or "electronically sent". You will need to get another appointment to get these prescribed. 4. Prescription Accuracy: You are responsible for carefully inspecting your prescriptions before leaving our office. Have the discharge nurse carefully go over each prescription with you, before taking them home. Make sure that your name is accurately spelled, that  your address is correct. Check the name and dose of your medication to make sure it is accurate. Check the number of pills, and the written instructions to make sure they are clear and accurate. Make sure that you are given enough medication to last until your next medication refill appointment. 5. Taking Medication: Take medication as prescribed. Never take more pills than instructed.  Never take medication more frequently than prescribed. Taking less pills or less frequently is permitted and encouraged, when it comes to controlled substances (written prescriptions).  6. Inform other Doctors: Always inform, all of your healthcare providers, of all the medications you take. 7. Pain Medication from other Providers: You are not allowed to accept any additional pain medication from any other Doctor or Healthcare provider. There are two exceptions to this rule. (see below) In the event that you require additional pain medication, you are responsible for notifying us, as stated below. 8. Medication Agreement: You are responsible for carefully reading and following our Medication Agreement. This must be signed before receiving any prescriptions from our practice. Safely store a copy of your signed Agreement. Violations to the Agreement will result in no further prescriptions. (Additional copies of our Medication Agreement are available upon request.) 9. Laws, Rules, & Regulations: All patients are expected to follow all Federal and [employer], [employer], Rules, [employer]. Ignorance of the Laws does not constitute a valid excuse. The use of any illegal substances is prohibited. 10. Adopted CDC guidelines & recommendations: Target dosing levels will be at or below 60 MME/day. Use of benzodiazepines** is not recommended.  Exceptions: There are only two exceptions to the rule of not receiving pain medications from other Healthcare Providers. 1. Exception #1 (Emergencies): In the event of an emergency (i.e.:  accident requiring emergency care), you are allowed to receive additional pain medication. However, you are responsible for: As soon as you are able, call our office [phone], at any time of the day or night, and leave a message stating your name, the date and nature of the emergency, and the name and dose of the medication prescribed. In the event that your call is answered by a member of our staff, make sure to document and save the date, time, and the name of the person that took your information.  2. Exception #2 (Planned Surgery): In the event that you are scheduled by another doctor or dentist to have any type of surgery or procedure, you are allowed (for a period no longer than 30 days), to receive additional pain medication, for the acute post-op pain. However, in this case, you are responsible for picking up a copy of our "Post-op Pain Management for Surgeons" handout, and giving it to your surgeon or dentist. This document is available at our office, and does not require an appointment to obtain it. Simply go to our office during business hours (Monday-Thursday from 8:00 AM to 4:00 PM) (Friday 8:00 AM to 12:00 Noon) or if you have a scheduled appointment with [name], prior to your surgery, and ask for it by name. In addition, you will need to provide [name] with your name, name of your surgeon, type of surgery, and date of procedure or surgery.  *Opioid medications include: morphine, codeine, oxycodone, oxymorphone, hydrocodone, hydromorphone, meperidine, tramadol, tapentadol, buprenorphine, fentanyl, methadone. **Benzodiazepine medications include: diazepam (Valium), alprazolam (Xanax), clonazepam (Klonopine), lorazepam (Ativan), clorazepate (Tranxene), chlordiazepoxide (Librium), estazolam (Prosom), oxazepam (Serax), temazepam (Restoril), triazolam (Halcion) (Last updated: 04/17/2017) ____________________________________________________________________________________________    BMI Assessment:  Estimated body mass index is 32.49 kg/m as calculated from the following:   Height as of this encounter: 5' 9"  (1.753 m).   Weight as of this encounter: 220 lb (99.8 kg).  BMI interpretation table: BMI level Category Range association with higher incidence of chronic pain  <18 kg/m2 Underweight   18.5-24.9 kg/m2 Ideal body weight   25-29.9 kg/m2 Overweight Increased incidence  by 20%  30-34.9 kg/m2 Obese (Class I) Increased incidence by 68%  35-39.9 kg/m2 Severe obesity (Class II) Increased incidence by 136%  >40 kg/m2 Extreme obesity (Class III) Increased incidence by 254%   Patient's current BMI Ideal Body weight  Body mass index is 32.49 kg/m. Ideal body weight: 70.7 kg (155 lb 13.8 oz) Adjusted ideal body weight: 82.3 kg (181 lb 8.3 oz)   BMI Readings from Last 4 Encounters:  12/03/17 32.49 kg/m  11/13/17 33.97 kg/m  09/11/17 33.67 kg/m  06/10/17 32.80 kg/m   Wt Readings from Last 4 Encounters:  12/03/17 220 lb (99.8 kg)  11/13/17 230 lb (104.3 kg)  09/11/17 228 lb (103.4 kg)  06/10/17 222 lb 1.6 oz (100.7 kg)    Flexeril 10 mg x 3 months, gabapentin x 3 months and oxycodone hcl 10 mg x 3 months to begin filling on 12/31/17 escribed to your pharmacy

## 2017-12-03 NOTE — Progress Notes (Signed)
Nursing Pain Medication Assessment:  Safety precautions to be maintained throughout the outpatient stay will include: orient to surroundings, keep bed in low position, maintain call bell within reach at all times, provide assistance with transfer out of bed and ambulation.  Medication Inspection Compliance: Pill count conducted under aseptic conditions, in front of the patient. Neither the pills nor the bottle was removed from the patient's sight at any time. Once count was completed pills were immediately returned to the patient in their original bottle.  Medication: Oxycodone IR Pill/Patch Count: 114 of 120 pills remain Pill/Patch Appearance: Markings consistent with prescribed medication Bottle Appearance: Standard pharmacy container. Clearly labeled. Filled Date: 10 / 14 / 2019 Last Medication intake:  Today

## 2018-01-18 ENCOUNTER — Other Ambulatory Visit: Payer: Self-pay

## 2018-01-18 ENCOUNTER — Emergency Department: Payer: Medicare Other

## 2018-01-18 ENCOUNTER — Encounter: Payer: Self-pay | Admitting: Emergency Medicine

## 2018-01-18 ENCOUNTER — Inpatient Hospital Stay
Admission: EM | Admit: 2018-01-18 | Discharge: 2018-01-19 | DRG: 641 | Disposition: A | Discharge status: Home Health | Payer: Medicare Other | Attending: Internal Medicine | Admitting: Internal Medicine

## 2018-01-18 DIAGNOSIS — I10 Essential (primary) hypertension: Secondary | ICD-10-CM | POA: Diagnosis present

## 2018-01-18 DIAGNOSIS — Z794 Long term (current) use of insulin: Secondary | ICD-10-CM

## 2018-01-18 DIAGNOSIS — R296 Repeated falls: Secondary | ICD-10-CM | POA: Diagnosis present

## 2018-01-18 DIAGNOSIS — G4730 Sleep apnea, unspecified: Secondary | ICD-10-CM | POA: Diagnosis present

## 2018-01-18 DIAGNOSIS — Z79899 Other long term (current) drug therapy: Secondary | ICD-10-CM | POA: Diagnosis not present

## 2018-01-18 DIAGNOSIS — Z981 Arthrodesis status: Secondary | ICD-10-CM | POA: Diagnosis not present

## 2018-01-18 DIAGNOSIS — E1140 Type 2 diabetes mellitus with diabetic neuropathy, unspecified: Secondary | ICD-10-CM | POA: Diagnosis present

## 2018-01-18 DIAGNOSIS — J449 Chronic obstructive pulmonary disease, unspecified: Secondary | ICD-10-CM | POA: Diagnosis present

## 2018-01-18 DIAGNOSIS — Z7982 Long term (current) use of aspirin: Secondary | ICD-10-CM | POA: Diagnosis not present

## 2018-01-18 DIAGNOSIS — H8109 Meniere's disease, unspecified ear: Secondary | ICD-10-CM | POA: Diagnosis present

## 2018-01-18 DIAGNOSIS — E7800 Pure hypercholesterolemia, unspecified: Secondary | ICD-10-CM | POA: Diagnosis present

## 2018-01-18 DIAGNOSIS — E875 Hyperkalemia: Secondary | ICD-10-CM | POA: Diagnosis present

## 2018-01-18 DIAGNOSIS — R531 Weakness: Secondary | ICD-10-CM

## 2018-01-18 DIAGNOSIS — Z9989 Dependence on other enabling machines and devices: Secondary | ICD-10-CM

## 2018-01-18 DIAGNOSIS — I959 Hypotension, unspecified: Secondary | ICD-10-CM | POA: Diagnosis present

## 2018-01-18 DIAGNOSIS — Z955 Presence of coronary angioplasty implant and graft: Secondary | ICD-10-CM

## 2018-01-18 DIAGNOSIS — K219 Gastro-esophageal reflux disease without esophagitis: Secondary | ICD-10-CM | POA: Diagnosis present

## 2018-01-18 DIAGNOSIS — I2510 Atherosclerotic heart disease of native coronary artery without angina pectoris: Secondary | ICD-10-CM | POA: Diagnosis present

## 2018-01-18 DIAGNOSIS — I252 Old myocardial infarction: Secondary | ICD-10-CM | POA: Diagnosis not present

## 2018-01-18 LAB — CBC
HCT: 43 % (ref 39.0–52.0)
Hemoglobin: 14.1 g/dL (ref 13.0–17.0)
MCH: 27.1 pg (ref 26.0–34.0)
MCHC: 32.8 g/dL (ref 30.0–36.0)
MCV: 82.7 fL (ref 80.0–100.0)
Platelets: 248 10*3/uL (ref 150–400)
RBC: 5.2 MIL/uL (ref 4.22–5.81)
RDW: 13.5 % (ref 11.5–15.5)
WBC: 10.7 10*3/uL — ABNORMAL HIGH (ref 4.0–10.5)
nRBC: 0 % (ref 0.0–0.2)

## 2018-01-18 LAB — URINALYSIS, COMPLETE (UACMP) WITH MICROSCOPIC
BILIRUBIN URINE: NEGATIVE
Glucose, UA: >=500 mg/dL — AB
HGB URINE DIPSTICK: NEGATIVE
Ketones, ur: NEGATIVE mg/dL
Leukocytes, UA: NEGATIVE
NITRITE: NEGATIVE
Protein, ur: NEGATIVE mg/dL
Specific Gravity, Urine: 1.014 (ref 1.005–1.030)
Squamous Epithelial / HPF: NONE SEEN (ref 0–5)
pH: 7 (ref 5.0–8.0)

## 2018-01-18 LAB — COMPREHENSIVE METABOLIC PANEL
ALBUMIN: 4.1 g/dL (ref 3.5–5.0)
ALT: 33 U/L (ref 0–44)
AST: 31 U/L (ref 15–41)
Alkaline Phosphatase: 68 U/L (ref 38–126)
Anion gap: 10 (ref 5–15)
BUN: 26 mg/dL — ABNORMAL HIGH (ref 8–23)
CO2: 23 mmol/L (ref 22–32)
Calcium: 9.6 mg/dL (ref 8.9–10.3)
Chloride: 101 mmol/L (ref 98–111)
Creatinine, Ser: 1.5 mg/dL — ABNORMAL HIGH (ref 0.61–1.24)
GFR calc Af Amer: 57 mL/min — ABNORMAL LOW (ref >60)
GFR calc non Af Amer: 49 mL/min — ABNORMAL LOW (ref >60)
GLUCOSE: 340 mg/dL — AB (ref 70–99)
Potassium: 7 mmol/L (ref 3.5–5.1)
Sodium: 134 mmol/L — ABNORMAL LOW (ref 135–145)
Total Bilirubin: 0.8 mg/dL (ref 0.3–1.2)
Total Protein: 7.3 g/dL (ref 6.5–8.1)

## 2018-01-18 LAB — GLUCOSE, CAPILLARY
Glucose-Capillary: 293 mg/dL — ABNORMAL HIGH (ref 70–99)
Glucose-Capillary: 251 mg/dL — ABNORMAL HIGH (ref 70–99)
Glucose-Capillary: 277 mg/dL — ABNORMAL HIGH (ref 70–99)
Glucose-Capillary: 295 mg/dL — ABNORMAL HIGH (ref 70–99)

## 2018-01-18 LAB — TROPONIN I: Troponin I: <0.03 ng/mL (ref <0.03)

## 2018-01-18 LAB — GROUP A STREP BY PCR: Group A Strep by PCR: NOT DETECTED

## 2018-01-18 LAB — POTASSIUM: Potassium: 6.2 mmol/L — ABNORMAL HIGH (ref 3.5–5.1)

## 2018-01-18 MED ORDER — MAGNESIUM OXIDE 400 (241.3 MG) MG PO TABS
400.0000 mg | ORAL_TABLET | Freq: Every day | ORAL | Status: DC
  Administered 2018-01-18 – 2018-01-19 (×2): 400 mg via ORAL
  Filled 2018-01-18 (×2): qty 1

## 2018-01-18 MED ORDER — ALPRAZOLAM 0.5 MG PO TABS: 0.5000 mg | ORAL_TABLET | Freq: Every evening | ORAL | Status: DC | PRN

## 2018-01-18 MED ORDER — ACETAMINOPHEN 325 MG PO TABS: 650.0000 mg | ORAL_TABLET | Freq: Four times a day (QID) | ORAL | Status: DC | PRN

## 2018-01-18 MED ORDER — ASPIRIN EC 81 MG PO TBEC
81.0000 mg | DELAYED_RELEASE_TABLET | Freq: Every day | ORAL | Status: DC
  Administered 2018-01-18: 81 mg via ORAL
  Filled 2018-01-18: qty 1

## 2018-01-18 MED ORDER — ONDANSETRON HCL 4 MG/2ML IJ SOLN: 4.0000 mg | Freq: Four times a day (QID) | INTRAMUSCULAR | Status: DC | PRN

## 2018-01-18 MED ORDER — SODIUM CHLORIDE 0.9 % IV BOLUS
1000.0000 mL | Freq: Once | INTRAVENOUS | Status: AC
  Administered 2018-01-18: 1000 mL via INTRAVENOUS

## 2018-01-18 MED ORDER — PANTOPRAZOLE SODIUM 40 MG PO TBEC
40.0000 mg | DELAYED_RELEASE_TABLET | Freq: Every day | ORAL | Status: DC
  Administered 2018-01-18 – 2018-01-19 (×2): 40 mg via ORAL
  Filled 2018-01-18 (×2): qty 1

## 2018-01-18 MED ORDER — DOCUSATE SODIUM 100 MG PO CAPS: 100.0000 mg | ORAL_CAPSULE | Freq: Every day | ORAL | Status: DC

## 2018-01-18 MED ORDER — ENOXAPARIN SODIUM 40 MG/0.4ML ~~LOC~~ SOLN
40.0000 mg | SUBCUTANEOUS | Status: DC
  Administered 2018-01-18: 40 mg via SUBCUTANEOUS
  Filled 2018-01-18: qty 0.4

## 2018-01-18 MED ORDER — NIACIN ER (ANTIHYPERLIPIDEMIC) 500 MG PO TBCR
500.0000 mg | EXTENDED_RELEASE_TABLET | Freq: Every day | ORAL | Status: DC
  Administered 2018-01-18: 500 mg via ORAL
  Filled 2018-01-18 (×2): qty 1

## 2018-01-18 MED ORDER — INSULIN ASPART 100 UNIT/ML ~~LOC~~ SOLN
0.0000 [IU] | Freq: Three times a day (TID) | SUBCUTANEOUS | Status: DC
  Administered 2018-01-19: 15 [IU] via SUBCUTANEOUS
  Administered 2018-01-19: 11 [IU] via SUBCUTANEOUS
  Filled 2018-01-18 (×2): qty 1

## 2018-01-18 MED ORDER — METOPROLOL SUCCINATE ER 50 MG PO TB24
50.0000 mg | ORAL_TABLET | Freq: Two times a day (BID) | ORAL | Status: DC
  Administered 2018-01-18 – 2018-01-19 (×2): 50 mg via ORAL
  Filled 2018-01-18 (×2): qty 1

## 2018-01-18 MED ORDER — ALBUTEROL SULFATE (2.5 MG/3ML) 0.083% IN NEBU: 2.5000 mg | INHALATION_SOLUTION | RESPIRATORY_TRACT | Status: DC | PRN

## 2018-01-18 MED ORDER — INSULIN REGULAR HUMAN (CONC) 500 UNIT/ML ~~LOC~~ SOPN
70.0000 [IU] | PEN_INJECTOR | Freq: Every day | SUBCUTANEOUS | Status: DC
  Filled 2018-01-18: qty 3

## 2018-01-18 MED ORDER — EZETIMIBE 10 MG PO TABS
10.0000 mg | ORAL_TABLET | Freq: Every day | ORAL | Status: DC
  Administered 2018-01-18 – 2018-01-19 (×2): 10 mg via ORAL
  Filled 2018-01-18 (×2): qty 1

## 2018-01-18 MED ORDER — ACETAMINOPHEN 650 MG RE SUPP: 650.0000 mg | Freq: Four times a day (QID) | RECTAL | Status: DC | PRN

## 2018-01-18 MED ORDER — DOCUSATE SODIUM 100 MG PO CAPS
100.0000 mg | ORAL_CAPSULE | Freq: Two times a day (BID) | ORAL | Status: DC
  Administered 2018-01-18 – 2018-01-19 (×2): 100 mg via ORAL
  Filled 2018-01-18 (×2): qty 1

## 2018-01-18 MED ORDER — OXYCODONE HCL 5 MG PO TABS: 10.0000 mg | ORAL_TABLET | Freq: Four times a day (QID) | ORAL | Status: DC | PRN

## 2018-01-18 MED ORDER — FAMOTIDINE 20 MG PO TABS
10.0000 mg | ORAL_TABLET | Freq: Every day | ORAL | Status: DC
  Administered 2018-01-18 – 2018-01-19 (×2): 10 mg via ORAL
  Filled 2018-01-18 (×2): qty 1

## 2018-01-18 MED ORDER — INSULIN ASPART 100 UNIT/ML ~~LOC~~ SOLN
10.0000 [IU] | Freq: Once | SUBCUTANEOUS | Status: AC
  Administered 2018-01-18: 10 [IU] via INTRAVENOUS
  Filled 2018-01-18: qty 1

## 2018-01-18 MED ORDER — AZITHROMYCIN 250 MG PO TABS
250.0000 mg | ORAL_TABLET | Freq: Every day | ORAL | Status: DC
  Administered 2018-01-18 – 2018-01-19 (×2): 250 mg via ORAL
  Filled 2018-01-18 (×2): qty 1

## 2018-01-18 MED ORDER — PATIROMER SORBITEX CALCIUM 8.4 G PO PACK
16.8000 g | PACK | ORAL | Status: AC
  Administered 2018-01-18: 16.8 g via ORAL
  Filled 2018-01-18: qty 2

## 2018-01-18 MED ORDER — PRASUGREL HCL 10 MG PO TABS
10.0000 mg | ORAL_TABLET | Freq: Every day | ORAL | Status: DC
  Administered 2018-01-19: 10 mg via ORAL
  Filled 2018-01-18 (×2): qty 1

## 2018-01-18 MED ORDER — CYCLOBENZAPRINE HCL 10 MG PO TABS: 10.0000 mg | ORAL_TABLET | Freq: Three times a day (TID) | ORAL | Status: DC | PRN

## 2018-01-18 MED ORDER — INSULIN ASPART 100 UNIT/ML ~~LOC~~ SOLN
0.0000 [IU] | Freq: Every day | SUBCUTANEOUS | Status: DC
  Administered 2018-01-18: 3 [IU] via SUBCUTANEOUS
  Filled 2018-01-18: qty 1

## 2018-01-18 MED ORDER — ROSUVASTATIN CALCIUM 10 MG PO TABS
20.0000 mg | ORAL_TABLET | Freq: Every day | ORAL | Status: DC
  Administered 2018-01-18: 20 mg via ORAL
  Filled 2018-01-18: qty 2

## 2018-01-18 MED ORDER — DEXTROSE 50 % IV SOLN
25.0000 g | Freq: Once | INTRAVENOUS | Status: AC
  Administered 2018-01-18: 25 g via INTRAVENOUS
  Filled 2018-01-18: qty 50

## 2018-01-18 MED ORDER — MONTELUKAST SODIUM 10 MG PO TABS
10.0000 mg | ORAL_TABLET | Freq: Every day | ORAL | Status: DC
  Administered 2018-01-19: 10 mg via ORAL
  Filled 2018-01-18 (×2): qty 1

## 2018-01-18 MED ORDER — MECLIZINE HCL 25 MG PO TABS
25.0000 mg | ORAL_TABLET | Freq: Every day | ORAL | Status: DC
  Administered 2018-01-18: 25 mg via ORAL
  Filled 2018-01-18 (×2): qty 1

## 2018-01-18 MED ORDER — POLYETHYLENE GLYCOL 3350 17 G PO PACK: 17.0000 g | PACK | Freq: Every day | ORAL | Status: DC | PRN

## 2018-01-18 MED ORDER — DILTIAZEM HCL ER COATED BEADS 120 MG PO CP24
240.0000 mg | ORAL_CAPSULE | Freq: Every day | ORAL | Status: DC
  Administered 2018-01-18: 240 mg via ORAL
  Filled 2018-01-18: qty 2

## 2018-01-18 MED ORDER — MECLIZINE HCL 25 MG PO TABS
25.0000 mg | ORAL_TABLET | Freq: Three times a day (TID) | ORAL | Status: DC | PRN
  Filled 2018-01-18: qty 1

## 2018-01-18 MED ORDER — ONDANSETRON HCL 4 MG PO TABS: 4.0000 mg | ORAL_TABLET | Freq: Four times a day (QID) | ORAL | Status: DC | PRN

## 2018-01-18 MED ORDER — OMEGA-3-ACID ETHYL ESTERS 1 G PO CAPS
1.0000 g | ORAL_CAPSULE | Freq: Every day | ORAL | Status: DC
  Administered 2018-01-18 – 2018-01-19 (×2): 1 g via ORAL
  Filled 2018-01-18 (×2): qty 1

## 2018-01-18 MED ORDER — OXYCODONE HCL 5 MG PO TABS: 10.0000 mg | ORAL_TABLET | Freq: Two times a day (BID) | ORAL | Status: DC | PRN

## 2018-01-18 MED ORDER — INSULIN REGULAR HUMAN (CONC) 500 UNIT/ML ~~LOC~~ SOPN
70.0000 [IU] | PEN_INJECTOR | Freq: Three times a day (TID) | SUBCUTANEOUS | Status: DC
  Administered 2018-01-19: 70 [IU] via SUBCUTANEOUS
  Filled 2018-01-18: qty 3

## 2018-01-18 MED ORDER — SODIUM CHLORIDE 0.9 % IV SOLN
INTRAVENOUS | Status: DC
  Administered 2018-01-18 – 2018-01-19 (×2): via INTRAVENOUS

## 2018-01-18 MED ORDER — PHENOL 1.4 % MT LIQD
1.0000 | OROMUCOSAL | Status: DC | PRN
  Administered 2018-01-18: 1 via OROMUCOSAL
  Filled 2018-01-18: qty 177

## 2018-01-18 MED ORDER — LORATADINE 10 MG PO TABS
10.0000 mg | ORAL_TABLET | Freq: Every day | ORAL | Status: DC
  Administered 2018-01-18 – 2018-01-19 (×2): 10 mg via ORAL
  Filled 2018-01-18 (×2): qty 1

## 2018-01-18 NOTE — Progress Notes (Signed)
Paged Dr. Brett Albino re: patient's home med list updated and need sliding scale coverage.

## 2018-01-18 NOTE — ED Notes (Signed)
First Nurse Note: Pt to ED with wife who states that pt has had AMS, unable to control bladder, and off balance.

## 2018-01-18 NOTE — H&P (Signed)
[facility] NAME: Kenneth Osborn    MR#:  [id number]  DATE OF BIRTH:  Jul 28, 1955  DATE OF ADMISSION:  01/18/2018  PRIMARY CARE PHYSICIAN: Ellene Route   REQUESTING/REFERRING PHYSICIAN: Dr Vicente Males  CHIEF COMPLAINT:  frequent falls due to balance issues and generalized weakness HISTORY OF PRESENT ILLNESS:  Kenneth Osborn  is a 62 y.o. male with a known history of Mnire's disease follows with Dr. Tami Ribas ENT undergoes vestibular physical therapy, hypertension, CAD, COPD to the emergency room accompanied by wife and daughter. Patient has been having frequent falls at home. He said he has a walker and a cane however he has not been using as recommended by therapist. Patient has had several falls according to the wife become weak. In the ER he was found to have potassium of 7.0. Received IV fluids IV dextrose with insulin and [place] EKG does not show PT waves. Patient is being admitted for further evaluation and management  denies chest pain shortness of breath blood pressure was a bit on the lower side.   PAST MEDICAL HISTORY:   Past Medical History:  Diagnosis Date  . Allergic rhinitis 12/30/2012  . Bronchitis    hx of  . Can't get food down 08/12/2014  . Chronic back pain    thoracic area  . Concussion 09/2015  . COPD (chronic obstructive pulmonary disease) ([place])   . Coronary artery disease    99% blockage  . DDD (degenerative disc disease), cervical   . Dehydration symptoms    2019  . 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 Dysphagia   . GERD (gastroesophageal reflux disease)   . History of Meniere's disease 12/21/2014  . History of thoracic spine surgery (S/P T9-10 IVD spacer) 12/21/2014  . Hypercholesteremia   . Hyperlipidemia   . Hypertension    sees Dr. Jenny Reichmann walker Jefm Bryant  . 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 Myocardial infarction [facility])     Sees Dr. Drema [place], [place] [facility]  . Neuromuscular disorder ([place])    diabetic neuropathy in feet  . Psychosis ([place])   . Short-segment Barrett's esophagus   . Sleep apnea    uses cpap and 2 L o2 at hs    PAST SURGICAL HISTOIRY:   Past Surgical History:  Procedure Laterality Date  . APPENDECTOMY    . ARTHRODESIS ANTERIOR ANTERIOR CERVICLE SPINE  01/04/2013  . BACK SURGERY     fusion thoracic area  . CARDIAC CATHETERIZATION     may 2012 and Nov 20, 2010  . CARDIAC CATHETERIZATION N/A 06/15/2015   Procedure: Left Heart Cath and Coronary Angiography;  Surgeon: Corey Skains, MD;  Location: [place] CV LAB;  Service: Cardiovascular;  Laterality: N/A;  . CARDIAC CATHETERIZATION N/A 06/15/2015   Procedure: Coronary Stent Intervention;  Surgeon: Isaias Cowman, MD;  Location: [place] [facility];  Service: Cardiovascular;  Laterality: N/A;  . CARDIOVASCULAR STRESS TEST     jan 2014  . COLONOSCOPY WITH PROPOFOL N/A 09/19/2014   Procedure: COLONOSCOPY WITH PROPOFOL;  Surgeon: Manya Silvas, MD;  Location: [facility] ENDOSCOPY;  Service: Endoscopy;  Laterality: N/A;  . CORONARY ANGIOPLASTY     stent placement  . ESOPHAGOGASTRODUODENOSCOPY N/A 09/19/2014   Procedure: ESOPHAGOGASTRODUODENOSCOPY (EGD);  Surgeon: Manya Silvas, MD;  Location: [facility] ENDOSCOPY;  Service: Endoscopy;  Laterality: N/A;  . [place] right ear  .  mastoid shunt     left, 2002, 1997 right ear, 1980 right ear  . SAVORY DILATION N/A 09/19/2014   Procedure: SAVORY DILATION;  Surgeon: Robert T Elliott, MD;  Location: [facility] ENDOSCOPY;  Service: Endoscopy;  Laterality: N/A;  . SHOULDER ARTHROSCOPY WITH SUBACROMIAL DECOMPRESSION Left 04/06/2012   Procedure: SHOULDER ARTHROSCOPY WITH SUBACROMIAL DECOMPRESSION;  Surgeon: Steve Lucey, MD;  Location: [facility] OR;  Service: Orthopedics;  Laterality: Left;  left shoulder arthroscopy, subacromial decompression and distal clavicle resection  . SPINAL CORD STIMULATOR IMPLANT  Right     SOCIAL HISTORY:   Social History   Tobacco Use  . Smoking status: Never Smoker  . Smokeless tobacco: Never Used  Substance Use Topics  . Alcohol use: No    FAMILY HISTORY:   Family History  Problem Relation Age of Onset  . Heart disease Mother   . Diabetes Mother   . Heart disease Father   . Cancer Sister   . Heart disease Maternal Aunt   . Heart disease Maternal Uncle   . Diabetes Maternal Grandmother   . Diabetes Paternal Grandmother     DRUG ALLERGIES:  No Known Allergies  REVIEW OF SYSTEMS:  Review of Systems  Constitutional: Positive for malaise/fatigue. Negative for chills, fever and weight loss.  HENT: Negative for ear discharge, ear pain and nosebleeds.   Eyes: Negative for blurred vision, pain and discharge.  Respiratory: Negative for sputum production, shortness of breath, wheezing and stridor.   Cardiovascular: Negative for chest pain, palpitations, orthopnea and PND.  Gastrointestinal: Negative for abdominal pain, diarrhea, nausea and vomiting.  Genitourinary: Negative for frequency and urgency.  Musculoskeletal: Positive for falls. Negative for back pain and joint pain.  Neurological: Positive for dizziness and weakness. Negative for sensory change, speech change and focal weakness.  Psychiatric/Behavioral: Negative for depression and hallucinations. The patient is not nervous/anxious.      MEDICATIONS AT HOME:   Prior to Admission medications   Medication Sig Start Date End Date Taking? Authorizing Provider  ALPRAZolam (XANAX) 0.5 MG tablet Take 0.5 mg by mouth at bedtime as needed (Meniere's disease).    Yes [provider]  aspirin EC 81 MG tablet Take 81 mg by mouth at bedtime.    Yes [provider]  azithromycin (ZITHROMAX) 250 MG tablet Take 250-500 mg by mouth See admin instructions. Take 2 tablets (500MG) on day 1 and then take 1 tablet (250MG) by mouth daily for next 4 days 01/18/18 01/23/18 Yes [provider]  cetirizine (ZYRTEC) 10 MG tablet Take 10 mg by mouth daily.   Yes [provider]  cyclobenzaprine (FLEXERIL) 10 MG tablet Take 1 tablet (10 mg total) by mouth every 8 (eight) hours as needed for muscle spasms. 12/03/17 03/03/18 Yes King, Crystal M, NP  diazepam (VALIUM) 5 MG tablet Take 5 mg by mouth 3 (three) times daily as needed (Meniere's disease).    Yes [provider]  diltiazem (CARTIA XT) 240 MG 24 hr capsule Take 240 mg by mouth at bedtime.   Yes [provider]  docusate sodium (COLACE) 100 MG capsule Take 100 mg by mouth daily.   Yes [provider]  enalapril (VASOTEC) 10 MG tablet Take 10 mg by mouth 2 (two) times daily.   Yes [provider]  esomeprazole (NEXIUM) 40 MG capsule Take 40 mg by mouth 2 (two) times daily.   Yes [provider]  ezetimibe (ZETIA) 10 MG tablet Take 10 mg by mouth daily. 03/26/17  Yes   [provider]  gabapentin (NEURONTIN) 800 MG tablet Take 2 tablets (1,600 mg total) by mouth 2 (two) times daily. Patient taking differently: Take 1,600 mg by mouth 3 (three) times daily.  12/03/17 03/03/18 Yes King, Diona Foley, NP  HUMULIN R U-500 KWIKPEN 500 UNIT/ML kwikpen Inject 70 Units into the skin 3 (three) times daily with meals.  05/08/17  Yes [provider]  Magnesium Oxide 500 MG TABS Take 500 mg by mouth daily.  01/22/12  Yes [provider]  meclizine (ANTIVERT) 25 MG tablet Take 25 mg by mouth 3 (three) times daily as needed for dizziness.  07/06/15  Yes [provider]  metFORMIN (GLUCOPHAGE) 1000 MG tablet Take 1,000 mg by mouth 2 (two) times daily with a meal.   Yes [provider]  metoprolol succinate (TOPROL-XL) 50 MG 24 hr tablet Take 50 mg by mouth 2 (two) times daily. Take with or immediately following a meal.    Yes [provider]  montelukast (SINGULAIR) 10 MG tablet Take 10 mg by mouth every evening.    Yes [provider]  naloxone  [facility]) 2 MG/2ML injection Inject content of syringe into thigh muscle. Call 911. 09/11/17  Yes Vevelyn Francois, NP  niacin (NIASPAN) 500 MG CR tablet Take 500 mg by mouth at bedtime.   Yes [provider]  Omega-3 Fatty Acids (FISH OIL) 1000 MG CAPS Take 1,200 mg by mouth 2 (two) times daily.    Yes [provider]  Oxycodone HCl 10 MG TABS Take 1 tablet (10 mg total) by mouth every 6 (six) hours as needed. 03/01/18 03/31/18 Yes Vevelyn Francois, NP  prasugrel (EFFIENT) 10 MG TABS Take 10 mg by mouth daily.   Yes [provider]  ranitidine (ZANTAC) 150 MG tablet Take 150 mg by mouth at bedtime.  02/23/15  Yes [provider]  rosuvastatin (CRESTOR) 20 MG tablet Take 20 mg by mouth at bedtime.   Yes [provider]  triamterene-hydrochlorothiazide (DYAZIDE) 37.5-25 MG capsule Take 1 capsule by mouth daily. 01/08/18  Yes [provider]  Oxycodone HCl 10 MG TABS Take 1 tablet (10 mg total) by mouth every 6 (six) hours as needed. 01/30/18 03/01/18  Vevelyn Francois, NP  Oxycodone HCl 10 MG TABS Take 1 tablet (10 mg total) by mouth every 6 (six) hours as needed. 12/31/17 01/30/18  Vevelyn Francois, NP      VITAL SIGNS:  Blood pressure 136/74, pulse 80, temperature 98.5 F (36.9 C), temperature source Oral, resp. rate 20, height 5' 9" (1.753 m), weight 102.1 kg, SpO2 98 %.  PHYSICAL EXAMINATION:  GENERAL:  62 y.o.-year-old patient lying in the bed with no acute distress.  EYES: Pupils equal, round, reactive to light and accommodation. No scleral icterus. Extraocular muscles intact.  HEENT: Head atraumatic, normocephalic. Oropharynx and nasopharynx clear.  NECK:  Supple, no jugular venous distention. No thyroid enlargement, no tenderness.  LUNGS: Normal breath sounds bilaterally, no wheezing, rales,rhonchi or crepitation. No use of accessory muscles of respiration.  CARDIOVASCULAR: S1, S2 normal. No murmurs, rubs, or gallops.  ABDOMEN: Soft,  nontender, nondistended. Bowel sounds present. No organomegaly or mass.  EXTREMITIES: No pedal edema, cyanosis, or clubbing.  NEUROLOGIC: Cranial nerves II through XII are intact. Muscle strength 5/5 in all extremities. Sensation intact. Gait not checked. Weak overall PSYCHIATRIC: The patient is alert and oriented x 3.  SKIN: No obvious rash, lesion, or ulcer.   LABORATORY PANEL:   CBC Recent Labs  Lab 01/18/18 1238  WBC 10.7*  HGB 14.1  HCT 43.0  PLT 248   ------------------------------------------------------------------------------------------------------------------  Chemistries  Recent Labs  Lab 01/18/18 1238  NA 134*  K 7.0*  CL 101  CO2 23  GLUCOSE 340*  BUN 26*  CREATININE 1.50*  CALCIUM 9.6  AST 31  ALT 33  ALKPHOS 68  BILITOT 0.8   ------------------------------------------------------------------------------------------------------------------  Cardiac Enzymes Recent Labs  Lab 01/18/18 1238  TROPONINI <0.03   ------------------------------------------------------------------------------------------------------------------  RADIOLOGY:  Ct Head Wo Contrast  Result Date: 01/18/2018 CLINICAL DATA:  Multiple falls. EXAM: CT HEAD WITHOUT CONTRAST TECHNIQUE: Contiguous axial images were obtained from the base of the skull through the vertex without intravenous contrast. COMPARISON:  09/03/2017 FINDINGS: Brain: No evidence of acute infarction, hemorrhage, hydrocephalus, extra-axial collection or mass lesion/mass effect. Moderate brain parenchymal volume loss. Vascular: No hyperdense vessel or unexpected calcification. Skull: Normal. Negative for fracture or focal lesion. Sinuses/Orbits: No acute finding. Other: None. IMPRESSION: No acute intracranial abnormality. Moderate brain parenchymal volume loss. Electronically Signed   By: Fidela Salisbury M.D.   On: 01/18/2018 13:38    EKG:  NSR no ST-T changes and no peaked T waves  IMPRESSION AND PLAN:    Kenneth Osborn  is a 62 y.o. male with a known history of Mnire's disease follows with Dr. Tami Ribas ENT undergoes vestibular physical therapy, hypertension, CAD, COPD to the emergency room accompanied by wife and daughter.  1. acute hyperkalemia -admit to medical floor with cardiac monitor -patient received treatment for hyperkalemia in the ER. With repeat labs in evening. -IV fluids -hold lisinopril and triamterene hydrochlorothiazide -follow-up met B  2. generalized weakness with frequent falls secondary to balance issues from meniere's dz -continue PRN meclizine, Valium -patient follows with ENT Dr. Tami Ribas -vestibular physical therapy--- at home -will get physical therapy due to his generalized weakness  3. CAD -continue cardiac meds  4. Relative hypotension -continue diltiazem and metoprolol withholding parameters -holding lisinopril and triamterene hydrochlorothiazide due to hyperkalemia and mild elevated creatinine  5. DVT prophylaxis subcu Lovenox  Discussed with patient and wife  All the records are reviewed and case discussed with ED provider. Management plans discussed with the patient, family and they are in agreement.  CODE STATUS: full  TOTAL TIME TAKING CARE OF THIS PATIENT: *50* minutes.    Fritzi Mandes M.D on 01/18/2018 at 3:45 PM  Between 7am to 6pm - Pager - [phone]  After 6pm go to [email or web address] - password [email or web address] [facility]  [facility]  Office  [phone]  CC: Primary care physician; Ellene Route

## 2018-01-18 NOTE — ED Notes (Signed)
Pt taken to CT.

## 2018-01-18 NOTE — ED Provider Notes (Signed)
[facility] Emergency Department Provider Note  Time seen: 12:25 PM  I have reviewed the triage vital signs and the nursing notes.   HISTORY  Chief Complaint Weakness and Fall    HPI Kenneth Osborn is a 62 y.o. male with a past medical history of Mnire's disease, COPD, CAD, diabetes, MI, presents to the emergency department for weakness, falls and frequent urination.  According to the patient he has a history of Mnire's disease, and is off balance at baseline.  Has a history of very frequent falls per patient.  States the falls are somewhat chronic however the weakness has been much worse over the past 2 to 3 days.  Also is having episodes of incontinence although the patient states he is not incontinent he needs to urinate and cannot get to the bathroom in time.  Denies any dysuria or hematuria.  Denies any fever, nausea vomiting or diarrhea.  Denies any chest pain or abdominal pain.  Largely negative review of systems.  Currently the patient is calm cooperative lying in bed watching TV and in no distress.   Past Medical History:  Diagnosis Date  . Allergic rhinitis 12/30/2012  . Bronchitis    hx of  . Can't get food down 08/12/2014  . Chronic back pain    thoracic area  . Concussion 09/2015  . COPD (chronic obstructive pulmonary disease) ([place])   . Coronary artery disease    99% blockage  . DDD (degenerative disc disease), cervical   . Dehydration symptoms    2019  . 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 Dysphagia   . GERD (gastroesophageal reflux disease)   . History of Meniere's disease 12/21/2014  . History of thoracic spine surgery (S/P T9-10 IVD spacer) 12/21/2014  . Hypercholesteremia   . Hyperlipidemia   . Hypertension    sees Dr. Jenny Reichmann walker Jefm Bryant  . 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 Myocardial infarction [facility])    Sees Dr. Drema Dallas, [place] [facility]  . Neuromuscular disorder ([place])     diabetic neuropathy in feet  . Psychosis ([place])   . Short-segment Barrett's esophagus   . Sleep apnea    uses cpap and 2 L o2 at hs    Patient Active Problem List   Diagnosis Date Noted  . Spondylosis without myelopathy or radiculopathy, lumbosacral region 11/13/2017  . Cardiac syncope 09/08/2017  . History of coronary artery disease 09/03/2017  . Chronic back pain 09/03/2017  . Spinal cord stimulator dysfunction ([place]) 12/26/2016  . Trigger point posterior superior iliac spine (PSIS) (Right) 12/25/2016  . 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 Polyneuropathy 12/18/2016  . Numbness and tingling 11/12/2016  . Tachycardia with heart rate 100-120 beats per minute 11/12/2016  . Meniere disease, bilateral 11/12/2016  . Headache disorder 11/12/2016  . Type 2 diabetes mellitus with diabetic neuropathy ([place]) 11/12/2016  . Type 2 diabetes mellitus with diabetic polyneuropathy, with long-term current use of insulin ([place]) 11/12/2016  . Tremor 09/11/2016  . Chronic pain syndrome 05/28/2016  . Bilateral carotid artery stenosis 01/24/2016  . Mild cognitive impairment 11/27/2015  . 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 Balance problem 10/09/2015  . Chronic tension-type headache, not intractable 10/09/2015  . Dizziness 10/09/2015  . Post-concussion headache 10/09/2015  . Paroxysmal supraventricular tachycardia ([place]) 08/31/2015  . CAD (coronary artery disease) 06/15/2015  . Diabetes mellitus, type II ([place]) 06/14/2015  .  Unstable angina ([place]) 06/08/2015  . Encounter for interrogation of neurostimulator 03/22/2015  . [employer] spinal cord stimulator (implant date: 11/01/2014) 03/22/2015  . Type 2 diabetes mellitus with hyperglycemia ([place]) 03/15/2015  . Uncontrolled type 2 diabetes mellitus with hyperglycemia, with long-term current use of insulin ([place]) 03/15/2015  . DM type 2 with diabetic peripheral neuropathy ([place]) 03/15/2015  . Acid  reflux 12/21/2014  . Obstructive apnea 12/21/2014  . S/P insertion of spinal cord stimulator 12/21/2014  . Chronic low back pain (Bilateral) (L>R) 12/21/2014  . 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 Beverely Pace at [facility] on 01/04/2013) 12/21/2014  . Long term current use of opiate analgesic 12/21/2014  . Long term prescription opiate use 12/21/2014  . Opiate use (60 MME/Day) 12/21/2014  . Opiate dependence ([place]) 12/21/2014  . Encounter for therapeutic drug level monitoring 12/21/2014  . Neurogenic pain 12/21/2014  . Thoracic facet syndrome (T8-10) 12/21/2014  . Lumbar facet syndrome (Bilateral) (R>L) 12/21/2014  . Cervical facet syndrome (Right) 12/21/2014  . Cervical spondylosis 12/21/2014  . Lumbar spondylosis 12/21/2014  . Chronic upper extremity pain (Left) 12/21/2014  . Chronic cervical radicular pain (Left) 12/21/2014  . Chronic upper back pain 12/21/2014  . History of thoracic spine surgery (S/P T9-10 IVD spacer) 12/21/2014  . 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 Chronic musculoskeletal pain 12/21/2014  . Myofascial pain 12/21/2014  . Chronic lower extremity pain (Left) 12/21/2014  . Chronic radicular lumbar pain (left L4 dermatomal pain) 12/21/2014  . Coronary artery disease 12/21/2014  . History of MI (myocardial infarction) (May 2012) 12/21/2014  . Chronic anticoagulation ([place]) 12/21/2014  . History of Meniere's disease 12/21/2014  . Sleep apnea 12/21/2014  . History of spinal surgery 11/14/2014  . CKD (chronic kidney disease) stage 3, GFR 30-59 ml/min (HCC) 09/26/2014  . Hypotension due to hypovolemia 09/22/2014  . Benign essential HTN 09/14/2014  . Hypertension associated with diabetes ([place]) 09/14/2014  . Barrett esophagus 08/12/2014  . H/O adenomatous polyp of colon 08/12/2014  . Dysphagia 08/12/2014  . Diabetic polyneuropathy ([place]) 06/20/2014  . Reflux 01/25/2014  . GERD (gastroesophageal reflux disease) 01/25/2014  . Arteriosclerosis of  coronary artery 08/13/2013  . Hyperlipidemia 08/13/2013  . Hyperlipidemia associated with type 2 diabetes mellitus ([place]) 08/13/2013  . Auditory vertigo 12/30/2012  . Cervical spinal stenosis 12/11/2012  . Arthralgia of shoulder 03/16/2012    Past Surgical History:  Procedure Laterality Date  . APPENDECTOMY    . ARTHRODESIS ANTERIOR ANTERIOR CERVICLE SPINE  01/04/2013  . BACK SURGERY     fusion thoracic area  . CARDIAC CATHETERIZATION     may 2012 and Nov 20, 2010  . CARDIAC CATHETERIZATION N/A 06/15/2015   Procedure: Left Heart Cath and Coronary Angiography;  Surgeon: Corey Skains, MD;  Location: [facility];  Service: Cardiovascular;  Laterality: N/A;  . CARDIAC CATHETERIZATION N/A 06/15/2015   Procedure: Coronary Stent Intervention;  Surgeon: Isaias Cowman, MD;  Location: [place] [facility];  Service: Cardiovascular;  Laterality: N/A;  . CARDIOVASCULAR STRESS TEST     jan 2014  . COLONOSCOPY WITH PROPOFOL N/A 09/19/2014   Procedure: COLONOSCOPY WITH PROPOFOL;  Surgeon: Manya Silvas, MD;  Location: [facility] ENDOSCOPY;  Service: Endoscopy;  Laterality: N/A;  . CORONARY ANGIOPLASTY     stent placement  . ESOPHAGOGASTRODUODENOSCOPY N/A 09/19/2014   Procedure: ESOPHAGOGASTRODUODENOSCOPY (EGD);  Surgeon: Manya Silvas, MD;  Location: [facility] ENDOSCOPY;  Service: Endoscopy;  Laterality: N/A;  . [place] right  ear  . mastoid shunt     left, 2002, 1997 right ear, 1980 right ear  . SAVORY DILATION N/A 09/19/2014   Procedure: SAVORY DILATION;  Surgeon: Manya Silvas, MD;  Location: [facility] ENDOSCOPY;  Service: Endoscopy;  Laterality: N/A;  . SHOULDER ARTHROSCOPY WITH SUBACROMIAL DECOMPRESSION Left 04/06/2012   Procedure: SHOULDER ARTHROSCOPY WITH SUBACROMIAL DECOMPRESSION;  Surgeon: Vickey Huger, MD;  Location: [place];  Service: Orthopedics;  Laterality: Left;  left shoulder arthroscopy, subacromial decompression and distal clavicle resection  . SPINAL CORD  STIMULATOR IMPLANT Right     Prior to Admission medications   Medication Sig Start Date End Date Taking? Authorizing Provider  ACCU-CHEK AVIVA PLUS test strip  01/29/15   [provider]  ALPRAZolam Duanne Moron) 0.5 MG tablet Take 0.5 mg by mouth at bedtime as needed (Meniere's disease).     [provider]  aspirin EC 81 MG tablet Take 81 mg by mouth at bedtime.     [provider]  cetirizine (ZYRTEC) 10 MG tablet Take 10 mg by mouth daily.    [provider]  cyclobenzaprine (FLEXERIL) 10 MG tablet Take 1 tablet (10 mg total) by mouth every 8 (eight) hours as needed for muscle spasms. 12/03/17 03/03/18  Vevelyn Francois, NP  diazepam (VALIUM) 5 MG tablet Take 5 mg by mouth 3 (three) times daily as needed (Meniere's disease).     [provider]  diltiazem (CARTIA XT) 240 MG 24 hr capsule Take 240 mg by mouth at bedtime.    [provider]  docusate sodium (COLACE) 100 MG capsule Take 100 mg by mouth daily.    [provider]  enalapril (VASOTEC) 10 MG tablet Take 10 mg by mouth 2 (two) times daily.    [provider]  esomeprazole (NEXIUM) 40 MG capsule Take 40 mg by mouth 2 (two) times daily.    [provider]  ezetimibe (ZETIA) 10 MG tablet Take 10 mg by mouth daily. 03/26/17   [provider]  gabapentin (NEURONTIN) 800 MG tablet Take 2 tablets (1,600 mg total) by mouth 2 (two) times daily. 12/03/17 03/03/18  Vevelyn Francois, NP  HUMULIN R U-500 KWIKPEN 500 UNIT/ML kwikpen 70 Units.  05/08/17   [provider]  Magnesium Oxide 500 MG TABS Take by mouth. 01/22/12   [provider]  meclizine (ANTIVERT) 25 MG tablet Take 25 mg by mouth 3 (three) times daily as needed for dizziness.  07/06/15   [provider]  metFORMIN (GLUCOPHAGE) 1000 MG tablet Take 1,000 mg by mouth 2 (two) times daily with a meal.    [provider]  metoprolol succinate (TOPROL-XL) 50 MG 24 hr tablet Take  50 mg by mouth 2 (two) times daily. Take with or immediately following a meal.     [provider]  montelukast (SINGULAIR) 10 MG tablet Take by mouth daily.     [provider]  naloxone [facility]) 2 MG/2ML injection Inject content of syringe into thigh muscle. Call 911. 09/11/17   Vevelyn Francois, NP  niacin (NIASPAN) 500 MG CR tablet Take 500 mg by mouth at bedtime.    [provider]  Omega-3 Fatty Acids (FISH OIL) 1000 MG CAPS Take 1,200 mg by mouth 2 (two) times daily.     [provider]  Oxycodone HCl 10 MG TABS Take 1 tablet (10 mg total) by mouth every 6 (six) hours as needed. 03/01/18 03/31/18  Vevelyn Francois, NP  Oxycodone HCl 10  MG TABS Take 1 tablet (10 mg total) by mouth every 6 (six) hours as needed. 01/30/18 03/01/18  Vevelyn Francois, NP  Oxycodone HCl 10 MG TABS Take 1 tablet (10 mg total) by mouth every 6 (six) hours as needed. 12/31/17 01/30/18  Vevelyn Francois, NP  prasugrel (EFFIENT) 10 MG TABS Take 10 mg by mouth daily.    [provider]  ranitidine (ZANTAC) 150 MG tablet Take 150 mg by mouth at bedtime.  02/23/15   [provider]  rosuvastatin (CRESTOR) 20 MG tablet Take 20 mg by mouth at bedtime.    [provider]  triamterene-hydrochlorothiazide (MAXZIDE) 75-50 MG tablet Take 1 tablet by mouth daily.  02/23/15   [provider]    No Known Allergies  Family History  Problem Relation Age of Onset  . Heart disease Mother   . Diabetes Mother   . Heart disease Father   . Cancer Sister   . Heart disease Maternal Aunt   . Heart disease Maternal Uncle   . Diabetes Maternal Grandmother   . Diabetes Paternal Grandmother     Social History Social History   Tobacco Use  . Smoking status: Never Smoker  . Smokeless tobacco: Never Used  Substance Use Topics  . Alcohol use: No  . Drug use: No    Review of Systems Constitutional: Negative for fever.  Positive for generalized weakness. Eyes: Negative  for visual complaints ENT: Sore throat.  Dry mouth. Cardiovascular: Negative for chest pain. Respiratory: Negative for shortness of breath. Gastrointestinal: Negative for abdominal pain, vomiting and diarrhea. Genitourinary: Negative for dysuria or hematuria.  Positive for urinary incontinence/accidents Musculoskeletal: Negative for musculoskeletal complaints Skin: Negative for skin complaints  Neurological: Negative for headache All other ROS negative  ____________________________________________   PHYSICAL EXAM:  VITAL SIGNS: ED Triage Vitals  Enc Vitals Group     BP 01/18/18 1125 118/66     Pulse Rate 01/18/18 1125 79     Resp 01/18/18 1125 18     Temp 01/18/18 1125 98.5 F (36.9 C)     Temp Source 01/18/18 1125 Oral     SpO2 01/18/18 1125 92 %     Weight 01/18/18 1125 225 lb (102.1 kg)     Height 01/18/18 1125 5' 9"  (1.753 m)     Head Circumference --      Peak Flow --      Pain Score 01/18/18 1130 0     Pain Loc --      Pain Edu? --      Excl. in [place]? --    Constitutional: Alert and oriented. Well appearing and in no distress. Eyes: Normal exam ENT   Head: Normocephalic and atraumatic.   Mouth/Throat: Very dry mucous membranes.  Mild pharyngeal erythema but no exudates noted. Cardiovascular: Normal rate, regular rhythm.  Respiratory: Normal respiratory effort without tachypnea nor retractions. Breath sounds are clear  Gastrointestinal: Soft and nontender. No distention.  Musculoskeletal: Nontender with normal range of motion in all extremities.  Neurologic:  Normal speech and language. No gross focal neurologic deficits Skin:  Skin is warm, dry and intact.  Psychiatric: Mood and affect are normal.   ____________________________________________    EKG  EKG viewed and interpreted by myself shows a normal sinus rhythm 80 bpm with a narrow QRS, normal axis, normal intervals, no concerning ST changes  ____________________________________________     RADIOLOGY  CT negative for acute abnormality  ____________________________________________   INITIAL IMPRESSION / ASSESSMENT AND PLAN /  ED COURSE  Pertinent labs & imaging results that were available during my care of the patient were reviewed by me and considered in my medical decision making (see chart for details).  Patient presents to the emergency department for weakness, chronic falls, urinary incontinence.  We will check labs including urinalysis, dose IV fluids obtain CT imaging of the head as a precaution and continue to closely monitor.  Overall the patient appears well at this time with no concerning findings on physical examination.  Patient's labs have resulted showing a significant hyperkalemia with a potassium of 7.0.  Also with mild renal insufficiency.  Troponin is negative.  Group A strep is negative.  Urinalysis is pending.  Given the patient's hyperkalemia this could explain his weakness.  We will dose insulin and glucose, IV hydrate, dose of [place] and continue to closely monitor.  Patient will require admission to the hospital for hyperkalemia and generalized weakness.  Urinalysis largely negative.  CRITICAL CARE Performed by: Harvest Dark   Total critical care time: 30 minutes  Critical care time was exclusive of separately billable procedures and treating other patients.  Critical care was necessary to treat or prevent imminent or life-threatening deterioration.  Critical care was time spent personally by me on the following activities: development of treatment plan with patient and/or surrogate as well as nursing, discussions with consultants, evaluation of patient's response to treatment, examination of patient, obtaining history from patient or surrogate, ordering and performing treatments and interventions, ordering and review of laboratory studies, ordering and review of radiographic studies, pulse oximetry and re-evaluation of patient's  condition.   ____________________________________________   FINAL CLINICAL IMPRESSION(S) / ED DIAGNOSES  Falls Weakness Sore throat Hyperkalemia   Harvest Dark, MD 01/18/18 [phone]

## 2018-01-18 NOTE — ED Triage Notes (Signed)
Pt has had multiple falls over last week. Wife reports she has had to pick up off floor.  increased weakness.  Multiple episodes of bladder incontinence over last week.  Wife reports similar with dehydration in past.  Pt knows needs to use bathroom just can't get to restroom in time.

## 2018-01-18 NOTE — ED Notes (Signed)
Date and time results received: 01/18/18 1305   Test: K+ Critical Value: 7.0  Name of Provider Notified: [facility]

## 2018-01-19 LAB — BASIC METABOLIC PANEL
Anion gap: 7 (ref 5–15)
BUN: 26 mg/dL — ABNORMAL HIGH (ref 8–23)
CO2: 24 mmol/L (ref 22–32)
Calcium: 9.7 mg/dL (ref 8.9–10.3)
Chloride: 107 mmol/L (ref 98–111)
Creatinine, Ser: 1.17 mg/dL (ref 0.61–1.24)
GFR calc Af Amer: >60 mL/min (ref >60)
Glucose, Bld: 283 mg/dL — ABNORMAL HIGH (ref 70–99)
Potassium: 5.3 mmol/L — ABNORMAL HIGH (ref 3.5–5.1)
SODIUM: 138 mmol/L (ref 135–145)

## 2018-01-19 LAB — URINE CULTURE: CULTURE: NO GROWTH

## 2018-01-19 LAB — GLUCOSE, CAPILLARY
Glucose-Capillary: 266 mg/dL — ABNORMAL HIGH (ref 70–99)
Glucose-Capillary: 325 mg/dL — ABNORMAL HIGH (ref 70–99)

## 2018-01-19 MED ORDER — INSULIN REGULAR HUMAN (CONC) 500 UNIT/ML ~~LOC~~ SOPN
40.0000 [IU] | PEN_INJECTOR | Freq: Three times a day (TID) | SUBCUTANEOUS | Status: DC
  Administered 2018-01-19: 40 [IU] via SUBCUTANEOUS
  Filled 2018-01-19: qty 3

## 2018-01-19 MED ORDER — HUMULIN R U-500 KWIKPEN 500 UNIT/ML ~~LOC~~ SOPN: 40.0000 [IU] | PEN_INJECTOR | Freq: Three times a day (TID) | SUBCUTANEOUS | 0 refills | Status: DC

## 2018-01-19 MED ORDER — OXYCODONE HCL 5 MG PO TABS: 10.0000 mg | ORAL_TABLET | Freq: Two times a day (BID) | ORAL | Status: DC | PRN

## 2018-01-19 NOTE — Discharge Instructions (Signed)
Hyperkalemia Hyperkalemia is when you have too much potassium in your blood. Potassium is normally removed (excreted) from your body by your kidneys. If there is too much potassium in your blood, it can affect how your heart works. Follow these instructions at home:  Take medicines only as told by your doctor.  Do not take any supplements, natural products, herbs, or vitamins unless your doctor says it is okay.  Limit your alcohol intake as told by your doctor.  Stop illegal drug use. If you need help quitting, ask your doctor.  Keep all follow-up visits as told by your doctor. This is important.  If you have kidney disease, you may need to follow a low potassium diet. A food specialist (dietitian) can help you. Contact a doctor if:  Your heartbeat is not regular or very slow.  You feel dizzy (light-headed).  You feel weak.  You feel sick to your stomach (nauseous).  You have tingling in your hands or feet.  You cannot feel your hands or feet. Get help right away if:  You are short of breath.  You have chest pain.  You pass out (faint).  You cannot move your muscles. This information is not intended to replace advice given to you by your health care provider. Make sure you discuss any questions you have with your health care provider. Document Released: 02/04/2005 Document Revised: 07/13/2015 Document Reviewed: 05/12/2013 Elsevier Interactive Patient Education  2018 [employer].

## 2018-01-19 NOTE — Care Management (Signed)
Patient is agreeable to have home health.  Agency preference after reviewing medicare.gov list is [facility].  Referral called for RN PT OT aide and accepted.

## 2018-01-19 NOTE — Care Management Note (Signed)
Case Management Note  Patient Details  Name: Kenneth Osborn MRN: [id number] Date of Birth: [birth year]  Subjective/Objective:                 Admitted from home with weakness, increase in falls and found to have a potassium of 7.0.  Lives with wife. History of Mnire's disease  which causes chronic balance issues.  has walker and cane in the home.  Current with pcp Dr Kary Kos .  PT consult is pending   Action/Plan:   Expected Discharge Date:  01/20/18               Expected Discharge Plan:     In-House Referral:     Discharge planning Services     Post Acute Care Choice:    Choice offered to:     DME Arranged:    DME Agency:     HH Arranged:    HH Agency:     Status of Service:     If discussed at [employer], dates discussed:    Additional Comments:  Katrina Stack, RN 01/19/2018, 8:17 AM

## 2018-01-19 NOTE — Evaluation (Signed)
Physical Therapy Evaluation Patient Details Name: Kenneth Osborn MRN: [id number] DOB: 01-21-1956 Today's Date: 01/19/2018   History of Present Illness  62 yo male with onset of instability with gait and L side UE weakness was referred to PT for mobility testing with a fall history.  Pt is having elevated K+ managed, has memory changes that are pre-exisiting, as well as denial of his mobility changes.  PMHx: L shoulder subacromial decompression, HTN, COPD, DDD, MI, concussion, back pain, Meniere's disease, PN, DM  Clinical Impression  Pt was seen for evaluation of mobility and strength, noted balance changes and difficulty with maneuvering in confined spaces.  His solution is to drop the walker and maneuver with insufficient support.  Talked with pt and wife about his limitations and wife is in agreement, but pt would prefer not to use any assistive devices.  Follow up acutely as needed for safety.    Follow Up Recommendations Home health PT;Supervision for mobility/OOB;Supervision/Assistance - 24 hour    Equipment Recommendations  Rolling walker with 5" wheels    Recommendations for Other Services       Precautions / Restrictions Precautions Precautions: Fall;Other (comment)(LUE weakness with low grip control) Restrictions Weight Bearing Restrictions: No      Mobility  Bed Mobility Overal bed mobility: Needs Assistance Bed Mobility: Supine to Sit;Sit to Supine     Supine to sit: Min assist Sit to supine: Min assist   General bed mobility comments: help to transition but uses bedrail for help  Transfers Overall transfer level: Needs assistance Equipment used: Rolling walker (2 wheeled);1 person hand held assist(IV pole) Transfers: Sit to/from Stand Sit to Stand: Min guard         General transfer comment: uses rails and wall bars to assist   Ambulation/Gait Ambulation/Gait assistance: Min assist Gait Distance (Feet): 150 Feet Assistive device: Rolling walker (2  wheeled);1 person hand held assist;IV Pole Gait Pattern/deviations: Step-through pattern;Step-to pattern;Wide base of support;Shuffle;Decreased stride length Gait velocity: reduced Gait velocity interpretation: <1.8 ft/sec, indicate of risk for recurrent falls General Gait Details: wide turns and lists to L side but cannot use LUE effectively with IV pole, which pt insisted he should use  Stairs            Wheelchair Mobility    Modified Rankin (Stroke Patients Only)       Balance Overall balance assessment: History of Falls                                           Pertinent Vitals/Pain Pain Assessment: No/denies pain    Home Living Family/patient expects to be discharged to:: Private residence Living Arrangements: Spouse/significant other Available Help at Discharge: Family Type of Home: House Home Access: Ramped entrance     Home Layout: One level Home Equipment: Environmental consultant - 2 wheels;Cane - single point Additional Comments: pt has history of TBI with impulsivity and LUE weakness    Prior Function Level of Independence: Independent         Comments: did not use AD but has been falling     Hand Dominance   Dominant Hand: Right    Extremity/Trunk Assessment   Upper Extremity Assessment Upper Extremity Assessment: LUE deficits/detail LUE Deficits / Details: has shoulder repair with IR posture and weak grip L hand LUE Sensation: history of peripheral neuropathy LUE Coordination: decreased fine motor;decreased gross motor  Lower Extremity Assessment Lower Extremity Assessment: Overall WFL for tasks assessed    Cervical / Trunk Assessment Cervical / Trunk Assessment: Kyphotic  Communication   Communication: No difficulties  Cognition Arousal/Alertness: Awake/alert Behavior During Therapy: Impulsive Overall Cognitive Status: History of cognitive impairments - at baseline                                 General  Comments: wife is aware of cognitive changes from TBI      General Comments General comments (skin integrity, edema, etc.): pt is getting up to walk with RW well with min guard for cued safety and then with IV pole was extremely unsafe, listing to L and unable to control his transitional balance    Exercises     Assessment/Plan    PT Assessment Patient needs continued PT services  PT Problem List Decreased strength;Decreased range of motion;Decreased activity tolerance;Decreased balance;Decreased mobility;Decreased coordination;Decreased cognition;Decreased knowledge of use of DME;Decreased safety awareness;Obesity       PT Treatment Interventions DME instruction;Gait training;Functional mobility training;Therapeutic activities;Therapeutic exercise;Balance training;Neuromuscular re-education;Patient/family education    PT Goals (Current goals can be found in the Care Plan section)  Acute Rehab PT Goals Patient Stated Goal: to get home and get back to independence PT Goal Formulation: With patient/family Time For Goal Achievement: 02/02/18 Potential to Achieve Goals: Good    Frequency Min 2X/week   Barriers to discharge   has accessible home per wife    Co-evaluation               AM-PAC PT "6 Clicks" Mobility  Outcome Measure Help needed turning from your back to your side while in a flat bed without using bedrails?: None Help needed moving from lying on your back to sitting on the side of a flat bed without using bedrails?: A Little Help needed moving to and from a bed to a chair (including a wheelchair)?: A Little Help needed standing up from a chair using your arms (e.g., wheelchair or bedside chair)?: A Little Help needed to walk in hospital room?: A Little Help needed climbing 3-5 steps with a railing? : A Lot 6 Click Score: 18    End of Session Equipment Utilized During Treatment: Gait belt Activity Tolerance: Treatment limited secondary to agitation Patient  left: in bed;with call bell/phone within reach;with bed alarm set;with family/visitor present Nurse Communication: Mobility status PT Visit Diagnosis: Unsteadiness on feet (R26.81);Other abnormalities of gait and mobility (R26.89);Repeated falls (R29.6);History of falling (Z91.81);Difficulty in walking, not elsewhere classified (R26.2);Other (comment)(L side changes of UE and lack of awareness of that UE change)    Time: 1165-7903 PT Time Calculation (min) (ACUTE ONLY): 24 min   Charges:   PT Evaluation $PT Eval Moderate Complexity: 1 Mod PT Treatments $Gait Training: 8-22 mins       Ramond Dial 01/19/2018, 12:23 PM   Mee Hives, PT MS Acute Rehab Dept. Number: [place] [facility] [place]

## 2018-01-19 NOTE — Progress Notes (Addendum)
Inpatient Diabetes Program Recommendations  AACE/ADA: New Consensus Statement on Inpatient Glycemic Control (2019)  Target Ranges:  Prepandial:   less than 140 mg/dL      Peak postprandial:   less than 180 mg/dL (1-2 hours)      Critically ill patients:  140 - 180 mg/dL   Results for BENJAMAN, ARTMAN (MRN [id number]) as of 01/19/2018 08:57  Ref. Range 01/18/2018 14:07 01/18/2018 17:59 01/18/2018 21:35 01/18/2018 22:01 01/19/2018 07:55  Glucose-Capillary Latest Ref Range: 70 - 99 mg/dL 293 (H) 251 (H) 277 (H) 295 (H) 266 (H)   Review of Glycemic Control  Diabetes history: DM2 Outpatient Diabetes medications: Humulin R U500 70 units TID with meals (patient notes that he decreases to 50 units sometimes depending on glucose) Current orders for Inpatient glycemic control: Humulin R U500 70 units TID with meals, Novolog 0-20 units TID with meals, Novolog 0-5 units QHS  Inpatient Diabetes Program Recommendations:  Insulin - Basal: Please consider decreasing Humulin R U500 to 40 units TID with meals. HgbA1C: Per Care Everywhere, last A1C 10.8% on 11/04/17 indicating an average glucose of 263 mg/dl.  NOTE: Spoke with patient over the phone about diabetes and home regimen for diabetes control. Patient reports that he is followed by Dr. Honor Junes for diabetes management and currently he takes Humulin R U500 70 units TID with meals and Metformin 1000 mg BID as an outpatient for diabetes control. Patient reports that he is taking insulin as prescribed consistently. Patient notes that he sometimes decreases the dose of Humulin R U500 to 50-60 units depending on glucose.  Patient was last seen by Dr. Honor Junes on 12/04/17 and per office note, patient was instructed to change Humulin R U500 to 75 units with breakfast, Humulin R U500 70 units with lunch, and Humulin R U500 55 units with supper.  Patient states that he does not use that particular regimen because he feels it does not work as well as taking the Humulin R  U500 70 units TID (and sometimes decreasing dose to 50-60 units depending on glucose). Patient states that he checks his glucose 3-4 times per day and that it usually ranges from 50-160's mg/dl.  Patient notes that he has 1-2 hypoglycemic episodes per week. Patient was using the FreeStyle Libre glucose monitoring sensor but he states that his wife washed his reader device in the washing machine so he can not use the sensor. Informed patient that since he had an I-phone he could download the [employer] app and use the phone app to monitor glucose with the sensor.  Encouraged patient to continue checking glucose 3-4 times per day and to try to download the [facility] app so he can use his phone app to monitor glucose with the FreeStyle [place] sensor. Encouraged patient to be sure Dr. Honor Junes is aware of current insulin dosages and adjustments he is making. Patient did NOT receive any Humulin R U500 insulin on 01/18/18 since arriving at the hospital and fasting glucose this morning is 266 mg/dl.  Informed patient it would be requested that Humulin R U500 dose be decreased while inpatient but it would be up to attending MD if they agree.  Patient verbalized understanding of information discussed and he states that he has no further questions at this time related to diabetes.  Thanks, Barnie Alderman, RN, MSN, CDE Diabetes Coordinator Inpatient Diabetes Program [phone] (Team Pager)

## 2018-01-20 NOTE — Discharge Summary (Signed)
[facility] [place] NAME: Kenneth Osborn    MR#:  [id number]  DATE OF BIRTH:  [birth year]  DATE OF ADMISSION:  01/18/2018   ADMITTING PHYSICIAN: Fritzi Mandes, MD  DATE OF DISCHARGE: 01/19/2018  2:55 PM  PRIMARY CARE PHYSICIAN: [name], MD   ADMISSION DIAGNOSIS:  Hyperkalemia [E87.5] Weakness [R53.1] DISCHARGE DIAGNOSIS:  Active Problems:   Hyperkalemia  SECONDARY DIAGNOSIS:   Past Medical History:  Diagnosis Date  . Allergic rhinitis 12/30/2012  . Bronchitis    hx of  . Can't get food down 08/12/2014  . Chronic back pain    thoracic area  . Concussion 09/2015  . COPD (chronic obstructive pulmonary disease) ([place])   . Coronary artery disease    99% blockage  . DDD (degenerative disc disease), cervical   . Dehydration symptoms    2019  . 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 Dysphagia   . GERD (gastroesophageal reflux disease)   . History of Meniere's disease 12/21/2014  . History of thoracic spine surgery (S/P T9-10 IVD spacer) 12/21/2014  . Hypercholesteremia   . Hyperlipidemia   . Hypertension    sees Dr. Jenny Reichmann walker [facility]  . 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 Myocardial infarction [facility])    Sees Dr. Drema Dallas, [place] [facility]  . Neuromuscular disorder ([place])    diabetic neuropathy in feet  . Psychosis ([place])   . Short-segment Barrett's esophagus   . Sleep apnea    uses cpap and 2 L o2 at hs   HOSPITAL COURSE:  Ladarien Beeks  is a 62 y.o. male with a known history of Mnire's disease follows with Dr. Tami Ribas ENT undergoes vestibular physical therapy, hypertension, CAD, COPD admitted for elevated K  1. Acute hyperkalemia -likely due to ARF and being ace-I and may be iatrogenic (patient states he was on K although he's not able to confirm dosing and if took it) -hold lisinopril and triamterene/hydrochlorothiazide until f/up with PCP -follow-up met B at PCP  office on f/up  2. Generalized weakness with frequent falls secondary to balance issues from meniere's dz -continue PRN meclizine, Valium -patient follows with ENT Dr. Tami Ribas -vestibular physical therapy--- at home  3. CAD - continue cardiac meds  4. Relative Hypotension - resolved -holding lisinopril and triamterene hydrochlorothiazide due to hyperkalemia and mild elevated creatinine DISCHARGE CONDITIONS:  stable CONSULTS OBTAINED:   DRUG ALLERGIES:  No Known Allergies DISCHARGE MEDICATIONS:   Allergies as of 01/19/2018   No Known Allergies     Medication List    STOP taking these medications   enalapril 10 MG tablet Commonly known as:  VASOTEC   Magnesium Oxide 500 MG Tabs   triamterene-hydrochlorothiazide 37.5-25 MG capsule Commonly known as:  DYAZIDE     TAKE these medications   ALPRAZolam 0.5 MG tablet Commonly known as:  XANAX Take 0.5 mg by mouth at bedtime as needed (Meniere's disease).   aspirin EC 81 MG tablet Take 81 mg by mouth at bedtime.   azithromycin 250 MG tablet Commonly known as:  ZITHROMAX Take 250-500 mg by mouth See admin instructions. Take 2 tablets (500MG) on day 1 and then take 1 tablet (250MG) by mouth daily for next 4 days   CARTIA XT 240 MG 24 hr capsule Generic drug:  diltiazem Take 240 mg by mouth at bedtime.   cetirizine 10 MG tablet Commonly known as:  ZYRTEC Take 10  mg by mouth daily.   cyclobenzaprine 10 MG tablet Commonly known as:  FLEXERIL Take 10 mg by mouth 2 (two) times daily.   cyclobenzaprine 10 MG tablet Commonly known as:  FLEXERIL Take 1 tablet (10 mg total) by mouth every 8 (eight) hours as needed for muscle spasms.   diazepam 5 MG tablet Commonly known as:  VALIUM Take 5 mg by mouth daily with breakfast.   diazepam 5 MG tablet Commonly known as:  VALIUM Take 5 mg by mouth 3 (three) times daily as needed (Meniere's disease).   docusate sodium 100 MG capsule Commonly known as:  COLACE Take 100 mg  by mouth daily.   esomeprazole 40 MG capsule Commonly known as:  NEXIUM Take 40 mg by mouth 2 (two) times daily.   ezetimibe 10 MG tablet Commonly known as:  ZETIA Take 10 mg by mouth daily.   Fish Oil 1000 MG Caps Take 1,200 mg by mouth 2 (two) times daily.   gabapentin 800 MG tablet Commonly known as:  NEURONTIN Take 2 tablets (1,600 mg total) by mouth 2 (two) times daily. What changed:  when to take this   HUMULIN R U-500 KWIKPEN 500 UNIT/ML kwikpen Generic drug:  insulin regular human CONCENTRATED Inject 40 Units into the skin 3 (three) times daily with meals. What changed:  how much to take   meclizine 25 MG tablet Commonly known as:  ANTIVERT Take 25 mg by mouth at bedtime.   meclizine 25 MG tablet Commonly known as:  ANTIVERT Take 25 mg by mouth 3 (three) times daily as needed for dizziness.   metFORMIN 1000 MG tablet Commonly known as:  GLUCOPHAGE Take 1,000 mg by mouth 2 (two) times daily with a meal.   metoprolol succinate 50 MG 24 hr tablet Commonly known as:  TOPROL-XL Take 50 mg by mouth 2 (two) times daily. Take with or immediately following a meal.   montelukast 10 MG tablet Commonly known as:  SINGULAIR Take 10 mg by mouth every morning.   naloxone 2 MG/2ML injection Commonly known as:  NARCAN Inject content of syringe into thigh muscle. Call 911.   niacin 500 MG CR tablet Commonly known as:  NIASPAN Take 500 mg by mouth at bedtime.   oxyCODONE 5 MG immediate release tablet Commonly known as:  Oxy IR/ROXICODONE Take 10 mg by mouth 2 (two) times daily. What changed:  Another medication with the same name was removed. Continue taking this medication, and follow the directions you see here.   Oxycodone HCl 10 MG Tabs Take 1 tablet (10 mg total) by mouth every 6 (six) hours as needed. Start taking on:  01/30/2018 What changed:  Another medication with the same name was removed. Continue taking this medication, and follow the directions you see  here.   Oxycodone HCl 10 MG Tabs Take 1 tablet (10 mg total) by mouth every 6 (six) hours as needed. Start taking on:  03/01/2018 What changed:  Another medication with the same name was removed. Continue taking this medication, and follow the directions you see here.   prasugrel 10 MG Tabs tablet Commonly known as:  EFFIENT Take 10 mg by mouth every morning.   ranitidine 150 MG tablet Commonly known as:  ZANTAC Take 150 mg by mouth at bedtime.   rosuvastatin 20 MG tablet Commonly known as:  CRESTOR Take 40 mg by mouth at bedtime.        DISCHARGE INSTRUCTIONS:   DIET:  Renal diet DISCHARGE CONDITION:  Good  ACTIVITY:  Activity as tolerated OXYGEN:  Home Oxygen: No.  Oxygen Delivery: room air DISCHARGE LOCATION:  home   If you experience worsening of your admission symptoms, develop shortness of breath, life threatening emergency, suicidal or homicidal thoughts you must seek medical attention immediately by calling 911 or calling your MD immediately  if symptoms less severe.  You Must read complete instructions/literature along with all the possible adverse reactions/side effects for all the Medicines you take and that have been prescribed to you. Take any new Medicines after you have completely understood and accpet all the possible adverse reactions/side effects.   Please note  You were cared for by a hospitalist during your hospital stay. If you have any questions about your discharge medications or the care you received while you were in the hospital after you are discharged, you can call the unit and asked to speak with the hospitalist on call if the hospitalist that took care of you is not available. Once you are discharged, your primary care physician will handle any further medical issues. Please note that NO REFILLS for any discharge medications will be authorized once you are discharged, as it is imperative that you return to your primary care physician (or  establish a relationship with a primary care physician if you do not have one) for your aftercare needs so that they can reassess your need for medications and monitor your lab values.    On the day of Discharge:  VITAL SIGNS:  Blood pressure 118/65, pulse 86, temperature (!) 97.3 F (36.3 C), temperature source Oral, resp. rate 20, height 5' 9" (1.753 m), weight 102.1 kg, SpO2 97 %. PHYSICAL EXAMINATION:  GENERAL:  62 y.o.-year-old patient lying in the bed with no acute distress.  EYES: Pupils equal, round, reactive to light and accommodation. No scleral icterus. Extraocular muscles intact.  HEENT: Head atraumatic, normocephalic. Oropharynx and nasopharynx clear.  NECK:  Supple, no jugular venous distention. No thyroid enlargement, no tenderness.  LUNGS: Normal breath sounds bilaterally, no wheezing, rales,rhonchi or crepitation. No use of accessory muscles of respiration.  CARDIOVASCULAR: S1, S2 normal. No murmurs, rubs, or gallops.  ABDOMEN: Soft, non-tender, non-distended. Bowel sounds present. No organomegaly or mass.  EXTREMITIES: No pedal edema, cyanosis, or clubbing.  NEUROLOGIC: Cranial nerves II through XII are intact. Muscle strength 5/5 in all extremities. Sensation intact. Gait not checked.  PSYCHIATRIC: The patient is alert and oriented x 3.  SKIN: No obvious rash, lesion, or ulcer.  DATA REVIEW:   CBC Recent Labs  Lab 01/18/18 1238  WBC 10.7*  HGB 14.1  HCT 43.0  PLT 248    Chemistries  Recent Labs  Lab 01/18/18 1238  01/19/18 0331  NA 134*  --  138  K 7.0*   < > 5.3*  CL 101  --  107  CO2 23  --  24  GLUCOSE 340*  --  283*  BUN 26*  --  26*  CREATININE 1.50*  --  1.17  CALCIUM 9.6  --  9.7  AST 31  --   --   ALT 33  --   --   ALKPHOS 68  --   --   BILITOT 0.8  --   --    < > = values in this interval not displayed.    Follow-up Information    [facility], [facility]. Go on 01/20/2018.   Why:  @ 10 am scheduled Contact information: [address] [place] [address] [phone]  Corey Skains, MD. Go on 01/21/2018.   Specialty:  Cardiology Why:  @ 3:30 as scheduled Contact information: [address] [facility] [place] [address] [place], [facility] [place] Follow up.   Specialty:  [place] Why:  Agency will contact you to scedule first home visit  Contact information: [place] [address] [phone]            Management plans discussed with the patient, family and they are in agreement.  CODE STATUS: Prior   TOTAL TIME TAKING CARE OF THIS PATIENT: 45 minutes.    Max Sane M.D on 01/20/2018 at 3:10 PM  Between 7am to 6pm - Pager - [phone]  After 6pm go to [email or web address] - Proofreader  [facility]  Office  [phone]  CC: Primary care physician; [name], MD   Note: This dictation was prepared with Dragon dictation along with smaller phrase technology. Any transcriptional errors that result from this process are unintentional.

## 2018-02-07 DIAGNOSIS — R296 Repeated falls: Secondary | ICD-10-CM | POA: Insufficient documentation

## 2018-02-18 ENCOUNTER — Inpatient Hospital Stay
Admission: EM | Admit: 2018-02-18 | Discharge: 2018-02-20 | DRG: 684 | Disposition: A | Discharge status: Home Health | Payer: Medicare Other | Attending: Internal Medicine | Admitting: Internal Medicine

## 2018-02-18 ENCOUNTER — Encounter: Payer: Self-pay | Admitting: Emergency Medicine

## 2018-02-18 ENCOUNTER — Other Ambulatory Visit: Payer: Self-pay

## 2018-02-18 DIAGNOSIS — H8101 Meniere's disease, right ear: Secondary | ICD-10-CM | POA: Diagnosis not present

## 2018-02-18 DIAGNOSIS — N179 Acute kidney failure, unspecified: Secondary | ICD-10-CM | POA: Diagnosis not present

## 2018-02-18 DIAGNOSIS — Z833 Family history of diabetes mellitus: Secondary | ICD-10-CM

## 2018-02-18 DIAGNOSIS — K219 Gastro-esophageal reflux disease without esophagitis: Secondary | ICD-10-CM | POA: Diagnosis not present

## 2018-02-18 DIAGNOSIS — E785 Hyperlipidemia, unspecified: Secondary | ICD-10-CM | POA: Diagnosis not present

## 2018-02-18 DIAGNOSIS — N289 Disorder of kidney and ureter, unspecified: Secondary | ICD-10-CM

## 2018-02-18 DIAGNOSIS — Z7982 Long term (current) use of aspirin: Secondary | ICD-10-CM

## 2018-02-18 DIAGNOSIS — E1140 Type 2 diabetes mellitus with diabetic neuropathy, unspecified: Secondary | ICD-10-CM | POA: Diagnosis present

## 2018-02-18 DIAGNOSIS — E875 Hyperkalemia: Secondary | ICD-10-CM | POA: Diagnosis present

## 2018-02-18 DIAGNOSIS — G8929 Other chronic pain: Secondary | ICD-10-CM | POA: Diagnosis not present

## 2018-02-18 DIAGNOSIS — E7800 Pure hypercholesterolemia, unspecified: Secondary | ICD-10-CM | POA: Diagnosis present

## 2018-02-18 DIAGNOSIS — Z79899 Other long term (current) drug therapy: Secondary | ICD-10-CM | POA: Diagnosis not present

## 2018-02-18 DIAGNOSIS — I252 Old myocardial infarction: Secondary | ICD-10-CM | POA: Diagnosis not present

## 2018-02-18 DIAGNOSIS — G4730 Sleep apnea, unspecified: Secondary | ICD-10-CM | POA: Diagnosis not present

## 2018-02-18 DIAGNOSIS — R531 Weakness: Secondary | ICD-10-CM | POA: Diagnosis present

## 2018-02-18 DIAGNOSIS — Z66 Do not resuscitate: Secondary | ICD-10-CM | POA: Diagnosis not present

## 2018-02-18 DIAGNOSIS — Z794 Long term (current) use of insulin: Secondary | ICD-10-CM

## 2018-02-18 DIAGNOSIS — I10 Essential (primary) hypertension: Secondary | ICD-10-CM | POA: Diagnosis present

## 2018-02-18 DIAGNOSIS — Z79891 Long term (current) use of opiate analgesic: Secondary | ICD-10-CM

## 2018-02-18 DIAGNOSIS — E860 Dehydration: Secondary | ICD-10-CM | POA: Diagnosis not present

## 2018-02-18 DIAGNOSIS — J449 Chronic obstructive pulmonary disease, unspecified: Secondary | ICD-10-CM | POA: Diagnosis present

## 2018-02-18 DIAGNOSIS — Z8249 Family history of ischemic heart disease and other diseases of the circulatory system: Secondary | ICD-10-CM

## 2018-02-18 DIAGNOSIS — I2510 Atherosclerotic heart disease of native coronary artery without angina pectoris: Secondary | ICD-10-CM | POA: Diagnosis present

## 2018-02-18 DIAGNOSIS — R296 Repeated falls: Secondary | ICD-10-CM | POA: Diagnosis not present

## 2018-02-18 LAB — URINALYSIS, COMPLETE (UACMP) WITH MICROSCOPIC
Bacteria, UA: NONE SEEN
Bilirubin Urine: NEGATIVE
Glucose, UA: NEGATIVE mg/dL
Hgb urine dipstick: NEGATIVE
Ketones, ur: NEGATIVE mg/dL
Leukocytes, UA: NEGATIVE
NITRITE: NEGATIVE
Protein, ur: NEGATIVE mg/dL
Specific Gravity, Urine: 1.013 (ref 1.005–1.030)
Squamous Epithelial / HPF: NONE SEEN (ref 0–5)
pH: 5 (ref 5.0–8.0)

## 2018-02-18 LAB — BASIC METABOLIC PANEL
Anion gap: 9 (ref 5–15)
BUN: 71 mg/dL — ABNORMAL HIGH (ref 8–23)
CHLORIDE: 99 mmol/L (ref 98–111)
CO2: 24 mmol/L (ref 22–32)
Calcium: 9.8 mg/dL (ref 8.9–10.3)
Creatinine, Ser: 2.76 mg/dL — ABNORMAL HIGH (ref 0.61–1.24)
GFR calc Af Amer: 27 mL/min — ABNORMAL LOW (ref >60)
GFR calc non Af Amer: 23 mL/min — ABNORMAL LOW (ref >60)
Glucose, Bld: 141 mg/dL — ABNORMAL HIGH (ref 70–99)
Potassium: 6.6 mmol/L (ref 3.5–5.1)
SODIUM: 132 mmol/L — AB (ref 135–145)

## 2018-02-18 LAB — CBC
HEMATOCRIT: 44.5 % (ref 39.0–52.0)
Hemoglobin: 14.3 g/dL (ref 13.0–17.0)
MCH: 26.7 pg (ref 26.0–34.0)
MCHC: 32.1 g/dL (ref 30.0–36.0)
MCV: 83 fL (ref 80.0–100.0)
Platelets: 308 10*3/uL (ref 150–400)
RBC: 5.36 MIL/uL (ref 4.22–5.81)
RDW: 14.1 % (ref 11.5–15.5)
WBC: 12.5 10*3/uL — ABNORMAL HIGH (ref 4.0–10.5)
nRBC: 0.2 % (ref 0.0–0.2)

## 2018-02-18 LAB — GLUCOSE, CAPILLARY: Glucose-Capillary: 122 mg/dL — ABNORMAL HIGH (ref 70–99)

## 2018-02-18 MED ORDER — SODIUM CHLORIDE 0.9 % IV BOLUS
1000.0000 mL | Freq: Once | INTRAVENOUS | Status: AC
  Administered 2018-02-18: 1000 mL via INTRAVENOUS

## 2018-02-18 MED ORDER — PATIROMER SORBITEX CALCIUM 8.4 G PO PACK
8.4000 g | PACK | Freq: Every day | ORAL | Status: DC
  Administered 2018-02-18 – 2018-02-20 (×3): 8.4 g via ORAL
  Filled 2018-02-18 (×3): qty 1

## 2018-02-18 NOTE — ED Notes (Signed)
Urine specimen sent

## 2018-02-18 NOTE — ED Provider Notes (Signed)
[facility] Emergency Department Provider Note  Time seen: 10:29 PM  I have reviewed the triage vital signs and the nursing notes.   HISTORY  Chief Complaint Weakness and Fall    HPI Kenneth Osborn is a 63 y.o. male the past medical history of Mnire's disease, COPD, hypertension, hyperlipidemia, presents to the emergency department for generalized weakness and fatigue.  Patient states similar symptoms the beginning of December when he was admitted to the hospital for similar findings.  Per record review patient had acute renal insufficiency as well as hyperkalemia at that time.  Patient denies any chest pain or trouble breathing but does states generalized body aches today.  Has not noted any decreased urination.  Does state dark urine at times.  Admits that he has not been hydrating as well as he should at home.  Patient did have a fall several days ago.  States a history of frequent falls secondary to Mnire's disease.   Past Medical History:  Diagnosis Date  . Allergic rhinitis 12/30/2012  . Bronchitis    hx of  . Can't get food down 08/12/2014  . Chronic back pain    thoracic area  . Concussion 09/2015  . COPD (chronic obstructive pulmonary disease) ([place])   . Coronary artery disease    99% blockage  . DDD (degenerative disc disease), cervical   . Dehydration symptoms    2019  . 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 Dysphagia   . GERD (gastroesophageal reflux disease)   . History of Meniere's disease 12/21/2014  . History of thoracic spine surgery (S/P T9-10 IVD spacer) 12/21/2014  . Hypercholesteremia   . Hyperlipidemia   . Hypertension    sees Dr. Jenny Reichmann walker Jefm Bryant  . 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 Myocardial infarction [facility])    Sees Dr. Drema [place], [place] [facility]  . Neuromuscular disorder ([place])    diabetic neuropathy in feet  . Psychosis ([place])   . Short-segment Barrett's esophagus    . Sleep apnea    uses cpap and 2 L o2 at hs    Patient Active Problem List   Diagnosis Date Noted  . Hyperkalemia 01/18/2018  . Spondylosis without myelopathy or radiculopathy, lumbosacral region 11/13/2017  . Cardiac syncope 09/08/2017  . History of coronary artery disease 09/03/2017  . Chronic back pain 09/03/2017  . Spinal cord stimulator dysfunction ([place]) 12/26/2016  . Trigger point posterior superior iliac spine (PSIS) (Right) 12/25/2016  . 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 Polyneuropathy 12/18/2016  . Numbness and tingling 11/12/2016  . Tachycardia with heart rate 100-120 beats per minute 11/12/2016  . Meniere disease, bilateral 11/12/2016  . Headache disorder 11/12/2016  . Type 2 diabetes mellitus with diabetic neuropathy ([place]) 11/12/2016  . Type 2 diabetes mellitus with diabetic polyneuropathy, with long-term current use of insulin ([place]) 11/12/2016  . Tremor 09/11/2016  . Chronic pain syndrome 05/28/2016  . Bilateral carotid artery stenosis 01/24/2016  . Mild cognitive impairment 11/27/2015  . 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 Balance problem 10/09/2015  . Chronic tension-type headache, not intractable 10/09/2015  . Dizziness 10/09/2015  . Post-concussion headache 10/09/2015  . Paroxysmal supraventricular tachycardia ([place]) 08/31/2015  . CAD (coronary artery disease) 06/15/2015  . Diabetes mellitus, type II ([place]) 06/14/2015  . Unstable angina ([place]) 06/08/2015  . Encounter for interrogation of neurostimulator 03/22/2015  . [employer] spinal cord stimulator (  implant date: 11/01/2014) 03/22/2015  . Type 2 diabetes mellitus with hyperglycemia ([place]) 03/15/2015  . Uncontrolled type 2 diabetes mellitus with hyperglycemia, with long-term current use of insulin ([place]) 03/15/2015  . DM type 2 with diabetic peripheral neuropathy ([place]) 03/15/2015  . Acid reflux 12/21/2014  . Obstructive apnea 12/21/2014  . S/P  insertion of spinal cord stimulator 12/21/2014  . Chronic low back pain (Bilateral) (L>R) 12/21/2014  . 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 Beverely Pace at [facility] on 01/04/2013) 12/21/2014  . Long term current use of opiate analgesic 12/21/2014  . Long term prescription opiate use 12/21/2014  . Opiate use (60 MME/Day) 12/21/2014  . Opiate dependence ([place]) 12/21/2014  . Encounter for therapeutic drug level monitoring 12/21/2014  . Neurogenic pain 12/21/2014  . Thoracic facet syndrome (T8-10) 12/21/2014  . Lumbar facet syndrome (Bilateral) (R>L) 12/21/2014  . Cervical facet syndrome (Right) 12/21/2014  . Cervical spondylosis 12/21/2014  . Lumbar spondylosis 12/21/2014  . Chronic upper extremity pain (Left) 12/21/2014  . Chronic cervical radicular pain (Left) 12/21/2014  . Chronic upper back pain 12/21/2014  . History of thoracic spine surgery (S/P T9-10 IVD spacer) 12/21/2014  . 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 Chronic musculoskeletal pain 12/21/2014  . Myofascial pain 12/21/2014  . Chronic lower extremity pain (Left) 12/21/2014  . Chronic radicular lumbar pain (left L4 dermatomal pain) 12/21/2014  . Coronary artery disease 12/21/2014  . History of MI (myocardial infarction) (May 2012) 12/21/2014  . Chronic anticoagulation ([place]) 12/21/2014  . History of Meniere's disease 12/21/2014  . Sleep apnea 12/21/2014  . History of spinal surgery 11/14/2014  . CKD (chronic kidney disease) stage 3, GFR 30-59 ml/min (HCC) 09/26/2014  . Hypotension due to hypovolemia 09/22/2014  . Benign essential HTN 09/14/2014  . Hypertension associated with diabetes ([place]) 09/14/2014  . Barrett esophagus 08/12/2014  . H/O adenomatous polyp of colon 08/12/2014  . Dysphagia 08/12/2014  . Diabetic polyneuropathy ([place]) 06/20/2014  . Reflux 01/25/2014  . GERD (gastroesophageal reflux disease) 01/25/2014  . Arteriosclerosis of coronary artery 08/13/2013  . Hyperlipidemia 08/13/2013   . Hyperlipidemia associated with type 2 diabetes mellitus ([place]) 08/13/2013  . Auditory vertigo 12/30/2012  . Cervical spinal stenosis 12/11/2012  . Arthralgia of shoulder 03/16/2012    Past Surgical History:  Procedure Laterality Date  . APPENDECTOMY    . ARTHRODESIS ANTERIOR ANTERIOR CERVICLE SPINE  01/04/2013  . BACK SURGERY     fusion thoracic area  . CARDIAC CATHETERIZATION     may 2012 and Nov 20, 2010  . CARDIAC CATHETERIZATION N/A 06/15/2015   Procedure: Left Heart Cath and Coronary Angiography;  Surgeon: Corey Skains, MD;  Location: [place] CV LAB;  Service: Cardiovascular;  Laterality: N/A;  . CARDIAC CATHETERIZATION N/A 06/15/2015   Procedure: Coronary Stent Intervention;  Surgeon: Isaias Cowman, MD;  Location: [place] CV [facility];  Service: Cardiovascular;  Laterality: N/A;  . CARDIOVASCULAR STRESS TEST     jan 2014  . COLONOSCOPY WITH PROPOFOL N/A 09/19/2014   Procedure: COLONOSCOPY WITH PROPOFOL;  Surgeon: Manya Silvas, MD;  Location: [facility] ENDOSCOPY;  Service: Endoscopy;  Laterality: N/A;  . CORONARY ANGIOPLASTY     stent placement  . ESOPHAGOGASTRODUODENOSCOPY N/A 09/19/2014   Procedure: ESOPHAGOGASTRODUODENOSCOPY (EGD);  Surgeon: Manya Silvas, MD;  Location: [facility] ENDOSCOPY;  Service: Endoscopy;  Laterality: N/A;  . [place] right ear  . mastoid shunt     left, 2002, 1997 right ear, 1980 right ear  .  SAVORY DILATION N/A 09/19/2014   Procedure: SAVORY DILATION;  Surgeon: Manya Silvas, MD;  Location: [facility] ENDOSCOPY;  Service: Endoscopy;  Laterality: N/A;  . SHOULDER ARTHROSCOPY WITH SUBACROMIAL DECOMPRESSION Left 04/06/2012   Procedure: SHOULDER ARTHROSCOPY WITH SUBACROMIAL DECOMPRESSION;  Surgeon: Vickey Huger, MD;  Location: [place];  Service: Orthopedics;  Laterality: Left;  left shoulder arthroscopy, subacromial decompression and distal clavicle resection  . SPINAL CORD STIMULATOR IMPLANT Right     Prior to Admission medications    Medication Sig Start Date End Date Taking? Authorizing Provider  ALPRAZolam Duanne Moron) 0.5 MG tablet Take 0.5 mg by mouth at bedtime as needed (Meniere's disease).     [provider]  aspirin EC 81 MG tablet Take 81 mg by mouth at bedtime.     [provider]  cetirizine (ZYRTEC) 10 MG tablet Take 10 mg by mouth daily.    [provider]  cyclobenzaprine (FLEXERIL) 10 MG tablet Take 1 tablet (10 mg total) by mouth every 8 (eight) hours as needed for muscle spasms. 12/03/17 03/03/18  Vevelyn Francois, NP  cyclobenzaprine (FLEXERIL) 10 MG tablet Take 10 mg by mouth 2 (two) times daily.    [provider]  diazepam (VALIUM) 5 MG tablet Take 5 mg by mouth 3 (three) times daily as needed (Meniere's disease).     [provider]  diazepam (VALIUM) 5 MG tablet Take 5 mg by mouth daily with breakfast.    [provider]  diltiazem (CARTIA XT) 240 MG 24 hr capsule Take 240 mg by mouth at bedtime.    [provider]  docusate sodium (COLACE) 100 MG capsule Take 100 mg by mouth daily.    [provider]  esomeprazole (NEXIUM) 40 MG capsule Take 40 mg by mouth 2 (two) times daily.    [provider]  ezetimibe (ZETIA) 10 MG tablet Take 10 mg by mouth daily. 03/26/17   [provider]  gabapentin (NEURONTIN) 800 MG tablet Take 2 tablets (1,600 mg total) by mouth 2 (two) times daily. Patient taking differently: Take 1,600 mg by mouth 3 (three) times daily.  12/03/17 03/03/18  Vevelyn Francois, NP  HUMULIN R U-500 KWIKPEN 500 UNIT/ML kwikpen Inject 40 Units into the skin 3 (three) times daily with meals. 01/19/18   Max Sane, MD  meclizine (ANTIVERT) 25 MG tablet Take 25 mg by mouth 3 (three) times daily as needed for dizziness.  07/06/15   [provider]  meclizine (ANTIVERT) 25 MG tablet Take 25 mg by mouth at bedtime.    [provider]  metFORMIN (GLUCOPHAGE) 1000 MG tablet Take 1,000 mg by mouth 2 (two)  times daily with a meal.    [provider]  metoprolol succinate (TOPROL-XL) 50 MG 24 hr tablet Take 50 mg by mouth 2 (two) times daily. Take with or immediately following a meal.     [provider]  montelukast (SINGULAIR) 10 MG tablet Take 10 mg by mouth every morning.     [provider]  naloxone [facility]) 2 MG/2ML injection Inject content of syringe into thigh muscle. Call 911. 09/11/17   Vevelyn Francois, NP  niacin (NIASPAN) 500 MG CR tablet Take 500 mg by mouth at bedtime.    [provider]  Omega-3 Fatty Acids (FISH OIL) 1000 MG CAPS Take 1,200 mg by mouth 2 (two) times daily.     [provider]  oxyCODONE (OXY IR/ROXICODONE) 5 MG immediate release tablet Take 10 mg by mouth  2 (two) times daily.    [provider]  Oxycodone HCl 10 MG TABS Take 1 tablet (10 mg total) by mouth every 6 (six) hours as needed. 03/01/18 03/31/18  Vevelyn Francois, NP  Oxycodone HCl 10 MG TABS Take 1 tablet (10 mg total) by mouth every 6 (six) hours as needed. 01/30/18 03/01/18  Vevelyn Francois, NP  prasugrel (EFFIENT) 10 MG TABS Take 10 mg by mouth every morning.     [provider]  ranitidine (ZANTAC) 150 MG tablet Take 150 mg by mouth at bedtime.  02/23/15   [provider]  rosuvastatin (CRESTOR) 20 MG tablet Take 40 mg by mouth at bedtime.     [provider]    No Known Allergies  Family History  Problem Relation Age of Onset  . Heart disease Mother   . Diabetes Mother   . Heart disease Father   . Cancer Sister   . Heart disease Maternal Aunt   . Heart disease Maternal Uncle   . Diabetes Maternal Grandmother   . Diabetes Paternal Grandmother     Social History Social History   Tobacco Use  . Smoking status: Never Smoker  . Smokeless tobacco: Never Used  Substance Use Topics  . Alcohol use: No  . Drug use: No    Review of Systems Constitutional: Negative for fever.  Positive for generalized  fatigue/weakness Cardiovascular: Negative for chest pain. Respiratory: Negative for shortness of breath. Gastrointestinal: Negative for abdominal pain, vomiting  Genitourinary: Negative for urinary compaints Musculoskeletal: Body aches/cramps. Skin: Negative for skin complaints  Neurological: Negative for headache All other ROS negative  ____________________________________________   PHYSICAL EXAM:  VITAL SIGNS: ED Triage Vitals  Enc Vitals Group     BP 02/18/18 2029 (!) 146/118     Pulse Rate 02/18/18 2029 73     Resp 02/18/18 2029 14     Temp 02/18/18 2029 98.7 F (37.1 C)     Temp Source 02/18/18 2029 Oral     SpO2 02/18/18 2029 95 %     Weight 02/18/18 2031 224 lb 13.9 oz (102 kg)     Height 02/18/18 2031 5' 9"  (1.753 m)     Head Circumference --      Peak Flow --      Pain Score 02/18/18 2030 5     Pain Loc --      Pain Edu? --      Excl. in [place]? --    Constitutional: Alert and oriented. Well appearing and in no distress. Eyes: Normal exam ENT   Head: Normocephalic and atraumatic.   Mouth/Throat: Mucous membranes are moist. Cardiovascular: Normal rate, regular rhythm. No murmur Respiratory: Normal respiratory effort without tachypnea nor retractions. Breath sounds are clear.  Mild right chest wall tenderness to palpation secondary to a fall 2 weeks ago. Gastrointestinal: Soft and nontender. No distention. Musculoskeletal: Nontender with normal range of motion in all extremities. No lower extremity tenderness or edema. Neurologic:  Normal speech and language. No gross focal neurologic deficits  Skin:  Skin is warm, dry and intact.  Psychiatric: Mood and affect are normal.   ____________________________________________    EKG  EKG viewed and interpreted by myself shows a normal sinus rhythm at 74 bpm with a narrow QRS, largely normal axis, normal intervals, no concerning ST changes.  ____________________________________________   INITIAL IMPRESSION /  ASSESSMENT AND PLAN / ED COURSE  Pertinent labs & imaging results that were available during my care of the  patient were reviewed by me and considered in my medical decision making (see chart for details).  Patient presents to the emergency department for generalized fatigue and weakness.  Labs have resulted showing hyperkalemia with a potassium of 6.6 and acute renal insufficiency.  This is likely the cause of the patient's weakness.  Possibly related to dehydration.  We will continue to IV hydrate in the emergency department.  We will dose [place].  Patient will require admission to the hospital for further treatment.  Patient agreeable to plan of care.  No signs of EKG changes.  ____________________________________________   FINAL CLINICAL IMPRESSION(S) / ED DIAGNOSES  Hyperkalemia Acute renal insufficiency Generalized weakness   Harvest Dark, MD 02/18/18 2232

## 2018-02-18 NOTE — ED Triage Notes (Addendum)
Pt in via POV with complaints of generalized weakness and numerous falls over the last week.  Pt seen here for same recently.  Pt alert to self only, some confusion noted in triage.  Pt hypertensive upon arrival, other vitals WDL.  NAD noted at this time.

## 2018-02-19 ENCOUNTER — Encounter: Payer: Self-pay | Admitting: *Deleted

## 2018-02-19 DIAGNOSIS — N179 Acute kidney failure, unspecified: Secondary | ICD-10-CM | POA: Diagnosis present

## 2018-02-19 DIAGNOSIS — Z79891 Long term (current) use of opiate analgesic: Secondary | ICD-10-CM | POA: Diagnosis not present

## 2018-02-19 DIAGNOSIS — E785 Hyperlipidemia, unspecified: Secondary | ICD-10-CM | POA: Diagnosis not present

## 2018-02-19 DIAGNOSIS — Z833 Family history of diabetes mellitus: Secondary | ICD-10-CM | POA: Diagnosis not present

## 2018-02-19 DIAGNOSIS — I252 Old myocardial infarction: Secondary | ICD-10-CM | POA: Diagnosis not present

## 2018-02-19 DIAGNOSIS — Z79899 Other long term (current) drug therapy: Secondary | ICD-10-CM | POA: Diagnosis not present

## 2018-02-19 DIAGNOSIS — G8929 Other chronic pain: Secondary | ICD-10-CM | POA: Diagnosis not present

## 2018-02-19 DIAGNOSIS — E875 Hyperkalemia: Secondary | ICD-10-CM | POA: Diagnosis not present

## 2018-02-19 DIAGNOSIS — Z8249 Family history of ischemic heart disease and other diseases of the circulatory system: Secondary | ICD-10-CM | POA: Diagnosis not present

## 2018-02-19 DIAGNOSIS — J449 Chronic obstructive pulmonary disease, unspecified: Secondary | ICD-10-CM | POA: Diagnosis not present

## 2018-02-19 DIAGNOSIS — H8101 Meniere's disease, right ear: Secondary | ICD-10-CM | POA: Diagnosis not present

## 2018-02-19 DIAGNOSIS — Z7982 Long term (current) use of aspirin: Secondary | ICD-10-CM | POA: Diagnosis not present

## 2018-02-19 DIAGNOSIS — Z794 Long term (current) use of insulin: Secondary | ICD-10-CM | POA: Diagnosis not present

## 2018-02-19 DIAGNOSIS — I2510 Atherosclerotic heart disease of native coronary artery without angina pectoris: Secondary | ICD-10-CM | POA: Diagnosis not present

## 2018-02-19 DIAGNOSIS — E1140 Type 2 diabetes mellitus with diabetic neuropathy, unspecified: Secondary | ICD-10-CM | POA: Diagnosis not present

## 2018-02-19 DIAGNOSIS — I10 Essential (primary) hypertension: Secondary | ICD-10-CM | POA: Diagnosis not present

## 2018-02-19 DIAGNOSIS — E7800 Pure hypercholesterolemia, unspecified: Secondary | ICD-10-CM | POA: Diagnosis not present

## 2018-02-19 DIAGNOSIS — E860 Dehydration: Secondary | ICD-10-CM | POA: Diagnosis not present

## 2018-02-19 DIAGNOSIS — R531 Weakness: Secondary | ICD-10-CM | POA: Diagnosis present

## 2018-02-19 LAB — GLUCOSE, CAPILLARY
GLUCOSE-CAPILLARY: 82 mg/dL (ref 70–99)
GLUCOSE-CAPILLARY: 342 mg/dL — AB (ref 70–99)
Glucose-Capillary: 88 mg/dL (ref 70–99)
Glucose-Capillary: 248 mg/dL — ABNORMAL HIGH (ref 70–99)
Glucose-Capillary: 262 mg/dL — ABNORMAL HIGH (ref 70–99)

## 2018-02-19 LAB — BASIC METABOLIC PANEL
Anion gap: 8 (ref 5–15)
BUN: 68 mg/dL — ABNORMAL HIGH (ref 8–23)
CO2: 23 mmol/L (ref 22–32)
Calcium: 9.4 mg/dL (ref 8.9–10.3)
Chloride: 106 mmol/L (ref 98–111)
Creatinine, Ser: 2.19 mg/dL — ABNORMAL HIGH (ref 0.61–1.24)
GFR calc Af Amer: 36 mL/min — ABNORMAL LOW (ref >60)
GFR calc non Af Amer: 31 mL/min — ABNORMAL LOW (ref >60)
Glucose, Bld: 85 mg/dL (ref 70–99)
Potassium: 4.7 mmol/L (ref 3.5–5.1)
Sodium: 137 mmol/L (ref 135–145)

## 2018-02-19 LAB — CBC
HCT: 38.4 % — ABNORMAL LOW (ref 39.0–52.0)
Hemoglobin: 12.4 g/dL — ABNORMAL LOW (ref 13.0–17.0)
MCH: 26.9 pg (ref 26.0–34.0)
MCHC: 32.3 g/dL (ref 30.0–36.0)
MCV: 83.3 fL (ref 80.0–100.0)
PLATELETS: 235 10*3/uL (ref 150–400)
RBC: 4.61 MIL/uL (ref 4.22–5.81)
RDW: 14 % (ref 11.5–15.5)
WBC: 9.8 10*3/uL (ref 4.0–10.5)
nRBC: 0 % (ref 0.0–0.2)

## 2018-02-19 MED ORDER — SODIUM CHLORIDE 0.9 % IV SOLN
INTRAVENOUS | Status: DC
  Administered 2018-02-19 – 2018-02-20 (×2): via INTRAVENOUS

## 2018-02-19 MED ORDER — ALPRAZOLAM 0.5 MG PO TABS: 0.5000 mg | ORAL_TABLET | Freq: Every evening | ORAL | Status: DC | PRN

## 2018-02-19 MED ORDER — DIAZEPAM 5 MG PO TABS: 5.0000 mg | ORAL_TABLET | Freq: Three times a day (TID) | ORAL | Status: DC | PRN

## 2018-02-19 MED ORDER — ONDANSETRON HCL 4 MG/2ML IJ SOLN: 4.0000 mg | Freq: Four times a day (QID) | INTRAMUSCULAR | Status: DC | PRN

## 2018-02-19 MED ORDER — ACETAMINOPHEN 325 MG PO TABS: 650.0000 mg | ORAL_TABLET | Freq: Four times a day (QID) | ORAL | Status: DC | PRN

## 2018-02-19 MED ORDER — OXYCODONE HCL 5 MG PO TABS
10.0000 mg | ORAL_TABLET | Freq: Four times a day (QID) | ORAL | Status: DC | PRN
  Administered 2018-02-19 (×2): 10 mg via ORAL
  Filled 2018-02-19 (×2): qty 2

## 2018-02-19 MED ORDER — INSULIN ASPART 100 UNIT/ML ~~LOC~~ SOLN
0.0000 [IU] | Freq: Three times a day (TID) | SUBCUTANEOUS | Status: DC
  Administered 2018-02-19: 8 [IU] via SUBCUTANEOUS
  Administered 2018-02-19: 5 [IU] via SUBCUTANEOUS
  Administered 2018-02-20: 8 [IU] via SUBCUTANEOUS
  Filled 2018-02-19 (×4): qty 1

## 2018-02-19 MED ORDER — PRASUGREL HCL 10 MG PO TABS
10.0000 mg | ORAL_TABLET | Freq: Every morning | ORAL | Status: DC
  Administered 2018-02-19 – 2018-02-20 (×2): 10 mg via ORAL
  Filled 2018-02-19 (×2): qty 1

## 2018-02-19 MED ORDER — DIAZEPAM 5 MG PO TABS
5.0000 mg | ORAL_TABLET | Freq: Every day | ORAL | Status: DC
  Administered 2018-02-19 – 2018-02-20 (×2): 5 mg via ORAL
  Filled 2018-02-19 (×2): qty 1

## 2018-02-19 MED ORDER — ASPIRIN EC 81 MG PO TBEC
81.0000 mg | DELAYED_RELEASE_TABLET | Freq: Every day | ORAL | Status: DC
  Administered 2018-02-19 (×2): 81 mg via ORAL
  Filled 2018-02-19 (×2): qty 1

## 2018-02-19 MED ORDER — DOCUSATE SODIUM 100 MG PO CAPS
100.0000 mg | ORAL_CAPSULE | Freq: Two times a day (BID) | ORAL | Status: DC
  Administered 2018-02-19 – 2018-02-20 (×4): 100 mg via ORAL
  Filled 2018-02-19 (×4): qty 1

## 2018-02-19 MED ORDER — EZETIMIBE 10 MG PO TABS
10.0000 mg | ORAL_TABLET | Freq: Every day | ORAL | Status: DC
  Administered 2018-02-19 (×2): 10 mg via ORAL
  Filled 2018-02-19 (×2): qty 1

## 2018-02-19 MED ORDER — HEPARIN SODIUM (PORCINE) 5000 UNIT/ML IJ SOLN
5000.0000 [IU] | Freq: Three times a day (TID) | INTRAMUSCULAR | Status: DC
  Administered 2018-02-19 – 2018-02-20 (×4): 5000 [IU] via SUBCUTANEOUS
  Filled 2018-02-19 (×4): qty 1

## 2018-02-19 MED ORDER — INSULIN ASPART 100 UNIT/ML ~~LOC~~ SOLN
0.0000 [IU] | Freq: Every day | SUBCUTANEOUS | Status: DC
  Administered 2018-02-19: 4 [IU] via SUBCUTANEOUS
  Filled 2018-02-19: qty 1

## 2018-02-19 MED ORDER — ONDANSETRON HCL 4 MG PO TABS: 4.0000 mg | ORAL_TABLET | Freq: Four times a day (QID) | ORAL | Status: DC | PRN

## 2018-02-19 MED ORDER — INSULIN GLARGINE 100 UNIT/ML ~~LOC~~ SOLN
20.0000 [IU] | Freq: Every day | SUBCUTANEOUS | Status: DC
  Administered 2018-02-19: 20 [IU] via SUBCUTANEOUS
  Filled 2018-02-19 (×2): qty 0.2

## 2018-02-19 MED ORDER — INSULIN ASPART 100 UNIT/ML ~~LOC~~ SOLN
4.0000 [IU] | Freq: Three times a day (TID) | SUBCUTANEOUS | Status: DC
  Administered 2018-02-19 – 2018-02-20 (×3): 4 [IU] via SUBCUTANEOUS
  Filled 2018-02-19 (×4): qty 1

## 2018-02-19 MED ORDER — NIACIN ER (ANTIHYPERLIPIDEMIC) 500 MG PO TBCR
500.0000 mg | EXTENDED_RELEASE_TABLET | Freq: Every day | ORAL | Status: DC
  Administered 2018-02-19: 500 mg via ORAL
  Filled 2018-02-19 (×2): qty 1

## 2018-02-19 MED ORDER — HALOPERIDOL LACTATE 5 MG/ML IJ SOLN
INTRAMUSCULAR | Status: AC
  Administered 2018-02-19: 16:00:00
  Filled 2018-02-19: qty 1

## 2018-02-19 MED ORDER — BISACODYL 5 MG PO TBEC: 5.0000 mg | DELAYED_RELEASE_TABLET | Freq: Every day | ORAL | Status: DC | PRN

## 2018-02-19 MED ORDER — ACETAMINOPHEN 650 MG RE SUPP: 650.0000 mg | Freq: Four times a day (QID) | RECTAL | Status: DC | PRN

## 2018-02-19 MED ORDER — HALOPERIDOL LACTATE 5 MG/ML IJ SOLN: 1.0000 mg | Freq: Four times a day (QID) | INTRAMUSCULAR | Status: DC | PRN

## 2018-02-19 MED ORDER — ROSUVASTATIN CALCIUM 10 MG PO TABS
40.0000 mg | ORAL_TABLET | Freq: Every day | ORAL | Status: DC
  Administered 2018-02-19: 40 mg via ORAL
  Filled 2018-02-19 (×2): qty 4

## 2018-02-19 MED ORDER — PANTOPRAZOLE SODIUM 40 MG PO TBEC
40.0000 mg | DELAYED_RELEASE_TABLET | Freq: Every day | ORAL | Status: DC
  Administered 2018-02-19 – 2018-02-20 (×2): 40 mg via ORAL
  Filled 2018-02-19 (×2): qty 1

## 2018-02-19 MED ORDER — MECLIZINE HCL 25 MG PO TABS
25.0000 mg | ORAL_TABLET | Freq: Three times a day (TID) | ORAL | Status: DC | PRN
  Filled 2018-02-19: qty 1

## 2018-02-19 MED ORDER — FAMOTIDINE 20 MG PO TABS
20.0000 mg | ORAL_TABLET | Freq: Every day | ORAL | Status: DC
  Administered 2018-02-19 (×2): 20 mg via ORAL
  Filled 2018-02-19 (×2): qty 1

## 2018-02-19 MED ORDER — ISOSORBIDE MONONITRATE ER 30 MG PO TB24
30.0000 mg | ORAL_TABLET | Freq: Every day | ORAL | Status: DC
  Administered 2018-02-19: 30 mg via ORAL
  Filled 2018-02-19: qty 1

## 2018-02-19 MED ORDER — METOPROLOL SUCCINATE ER 50 MG PO TB24
50.0000 mg | ORAL_TABLET | Freq: Two times a day (BID) | ORAL | Status: DC
  Administered 2018-02-19: 50 mg via ORAL
  Filled 2018-02-19: qty 1

## 2018-02-19 MED ORDER — DILTIAZEM HCL ER COATED BEADS 120 MG PO CP24
240.0000 mg | ORAL_CAPSULE | Freq: Every day | ORAL | Status: DC
  Administered 2018-02-19 (×2): 240 mg via ORAL
  Filled 2018-02-19 (×2): qty 2

## 2018-02-19 MED ORDER — NITROGLYCERIN 0.4 MG SL SUBL: 0.4000 mg | SUBLINGUAL_TABLET | SUBLINGUAL | Status: DC | PRN

## 2018-02-19 MED ORDER — SODIUM CHLORIDE 0.9% FLUSH
3.0000 mL | Freq: Two times a day (BID) | INTRAVENOUS | Status: DC
  Administered 2018-02-19: 3 mL via INTRAVENOUS

## 2018-02-19 NOTE — Plan of Care (Signed)
Insulin pump in left arm, but not using, other half taken home by wife. Diabetes coordinator consult placed. Problem: Clinical Measurements: Goal: Respiratory complications will improve Outcome: Progressing   Problem: Activity: Goal: Risk for activity intolerance will decrease Outcome: Progressing Note:  Stood at bedside, with one assist, tolerating well   Problem: Pain Managment: Goal: General experience of comfort will improve Outcome: Progressing Note:  Complaints of chronic back pain treated once with oxycodone with relief   Problem: Safety: Goal: Ability to remain free from injury will improve Outcome: Progressing Note:  Low bed in place, history of multiple falls   Problem: Skin Integrity: Goal: Risk for impaired skin integrity will decrease Outcome: Progressing   Problem: Education: Goal: Knowledge of General Education information will improve Description Including pain rating scale, medication(s)/side effects and non-pharmacologic comfort measures Outcome: Completed/Met

## 2018-02-19 NOTE — Care Management Note (Signed)
Case Management Note  Patient Details  Name: Kenneth Osborn MRN: [id number] Date of Birth: May 28, 1955  Subjective/Objective:            Patient is from home with wife.  Admitted with acute kidney injury; hyperkalemia.  He has had multiple falls.  He has 2 walkers and 2 canes at home.  Current with PCP.  Denies difficulties obtaining medications or with medical care.  Pharmacy is [facility] in [place].    Provided [place] list; mentioned that [facility] has a "[facility]" home health PT program that he could benefit from.  They would like to think about it.  Wife states [facility] is sending a PT out on January 13th.  This would be too far out if patient discharges within the next couple of days.  Will continue to follow and assist with discharge planning.        Action/Plan:   Expected Discharge Date:                  Expected Discharge Plan:  [place]  In-House Referral:     Discharge planning Services  CM Consult  Post Acute Care Choice:  Home Health Choice offered to:  Spouse, Patient  DME Arranged:    DME Agency:     HH Arranged:  PT([facility]) [place] Agency:  [facility] (now [facility])  Status of Service:  In process, will continue to follow  If discussed at Long Length of Stay Meetings, dates discussed:    Additional Comments:  Elza Rafter, RN 02/19/2018, 2:48 PM

## 2018-02-19 NOTE — Progress Notes (Signed)
The patient is alert, awake and orientated x3 but he looks confused.  The wife said this is not his baseline.  He wants to leave and go home.  I explained to him and his wife about his current condition and treatment plan.  I stated that he is not ready to be discharged home today.  And then he was agitated and pulled out IV.  He was given Haldol. Vitals and labs are reviewed, physical examination is done.  1.acute hyperkalemia -patient received treatment for hyperkalemia in the ER.   -hold lisinopril and triamterene hydrochlorothiazide Improved.  2. AKI : Likely related to dehydration.   Continue IV hydration.  Hold nephrotoxic agent.  Monitor BMP.  3.generalized weakness with frequent falls secondary to balance issues from meniere's dz -continue PRN meclizine, Valium  4.CAD -continue cardiac meds  5. Chronic other medical problems: Monitor.  Continue home medication as ordered  Discussed with the patient, his wife, RN and case Freight forwarder.  Additional time spent 40 minutes.

## 2018-02-19 NOTE — Progress Notes (Signed)
Inpatient Diabetes Program Recommendations  AACE/ADA: New Consensus Statement on Inpatient Glycemic Control (2019)  Target Ranges:  Prepandial:   less than 140 mg/dL      Peak postprandial:   less than 180 mg/dL (1-2 hours)      Critically ill patients:  140 - 180 mg/dL   Results for Kenneth Osborn, Kenneth Osborn (MRN [id number]) as of 02/19/2018 11:33  Ref. Range 02/18/2018 20:53 02/19/2018 02:11 02/19/2018 07:39  Glucose-Capillary Latest Ref Range: 70 - 99 mg/dL 122 (H) 82 88   Review of Glycemic Control  Diabetes history: DM2 Outpatient Diabetes medications: Humulin R U500 40-70 units TID with meals, Metformin 1000 mg BID Current orders for Inpatient glycemic control: Novolog 0-15 units TID with meals, Novolog 0-5 units QHS  Inpatient Diabetes Program Recommendations:  Insulin - Basal: Please consider ordering Lantus 20 units Q24H (based on 101 kg x 0.2 units) starting now. Insulin - Meal Coverage: Please consider ordering Novolog 4 units TID with meals for meal coverage if patient eats at least 50% of meals.  NOTE: Spoke with patient and his wife about diabetes and home regimen for diabetes control. Patient reports that he is followed by Dr. Honor Junes for diabetes management and currently he takes Humulin R U500 70 units TID with meals and Metformin 1000 mg BID as an outpatient for diabetes control. Patient reports that he is taking insulin as prescribed consistently but notes that he sometimes skips and/or decreases the dose of U500 depending on glucose.  Patient notes that he sometimes decreases the dose of Humulin R U500 to 40-60 units depending on glucose and at times skips it all together if his glucose is running too low and he is not eating.  Patient was last seen by Dr. Honor Junes on 12/04/17 and patient reports that he has an appointment this month coming up with Dr. Honor Junes (per chart on 03/12/18). Patient is using a FreeStyle Libre (flash glucose monitoring sensor) to monitor glucose. Patient notes  that he likes the device and currently has a FreeStyle Libre sensor on his left upper arm. Of note, the FreeStyle Libre sensor is just a glucose monitoring sensor, not an insulin pump - as noted in consult for diabetes coordinator).  Patient also has the reader device with him at this time and when he scanned the sensor it reports that his glucose is now 280 mg/dl.  Patient notes that the scanned value has been a little off from the actual finger stick values obtained by nursing staff. Informed patient that the finger stick is most accurate as the sensor is reading glucose in the interstitial fluid so there is a delay in the sensor reading when compared to the finger stick glucose.  Patient admits that he has hypoglycemia from time to time. Patient has recently been trying to change his diet and notes that he has not needed to take as much Humulin R U500.  Informed patient that he is currently only ordered Novolog correction and that it will be requested to order basal and meal coverage insulin as well while inpatient. Encouraged patient to continue checking glucose 3-4 times per day and to try to download the [employer] app (on his cell phone) so he can use his phone app to monitor glucose with the FreeStyle Libre sensor even when he does not have the reader device. Encouraged patient to be sure Dr. Honor Junes is aware of current insulin dosages and adjustments he is making. Asked patient to keep a detailed record of glucose and exact  dose of Humulin R U500 he takes (even if he skips it to write zero for dose or write skipped). Patient verbalized understanding of information discussed and he states that he has no further questions at this time related to diabetes.  Thanks, Barnie Alderman, RN, MSN, CDE Diabetes Coordinator Inpatient Diabetes Program [phone] (Team Pager from 8am to 5pm)

## 2018-02-19 NOTE — Progress Notes (Signed)
Pt threatening to leave AMA / Dr. Bridgett Larsson paged to make aware/ Dr. Bridgett Larsson spoke with pt and pts wife over the phone/ advised pt that he is not safe to be discharged/ pt becoming more agitated and combative/ trying to pull out IV/ pt is unsteady on his feet/  CODE 300 called/ med given / pt has calmed down some/ pt persuaded to sit on edge of bed/ family, nurse, and police officer at bedside/ will continue to monitor.

## 2018-02-19 NOTE — Progress Notes (Signed)
Advanced Care Plan.  Purpose of Encounter: CODE STATUS. Parties in Attendance: The patient, his wife and me. Patient's Decisional Capacity: Yes. Medical Story: Kenneth Osborn  is a 63 y.o. male with multiple medical problem including COPD, CAD, hypertension, diabetes, Mnire's disease, psychosis, sleep apnea and etc.  Patient is admitted for hyperkalemia and acute renal failure.  I discussed with the patient about his current condition, prognosis and CODE STATUS.  He stated that he does not now want to be resuscitated and intubated if he has cardiopulmonary arrest.  This is confirmed with his wife. Plan:  Code Status: Full code. Other documents completed: Changed to DNR order and signed DNR paper. Time spent discussing advance care planning: 18 minutes.

## 2018-02-19 NOTE — H&P (Signed)
[facility] [facility] [place] NAME: Kenneth Osborn    MR#:  [id number]  DATE OF BIRTH:  Dec 22, 1955  DATE OF ADMISSION:  02/18/2018  PRIMARY CARE PHYSICIAN: [name], MD   REQUESTING/REFERRING PHYSICIAN: Dr. Kerman Passey  CHIEF COMPLAINT:   Chief Complaint  Patient presents with  . Weakness  . Fall    HISTORY OF PRESENT ILLNESS:  Kenneth Osborn  is a 63 y.o. male with a known history listed below presented to emergency room for evaluation of generalized weakness, fatigue and tiredness.  Patient and family said he has similar symptoms on last admission when his potassium was high.  Patient recently started back on his potassium pills by primary care physician.  Patient and family noticed generalized weakness and tiredness.  No UTI symptoms.  No fever or chills.  No chest pain or shortness of breath.  No other complaints.  In emergency room patient has initial evaluation that showed acute kidney injury and hyperkalemia.  No EKG changes.  Patient received medication for hyperkalemia.  Patient started on IV fluid.  Hospitalist team requested for admission.  PAST MEDICAL HISTORY:   Past Medical History:  Diagnosis Date  . Allergic rhinitis 12/30/2012  . Bronchitis    hx of  . Can't get food down 08/12/2014  . Chronic back pain    thoracic area  . Concussion 09/2015  . COPD (chronic obstructive pulmonary disease) ([place])   . Coronary artery disease    99% blockage  . DDD (degenerative disc disease), cervical   . Dehydration symptoms    2019  . 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 Dysphagia   . GERD (gastroesophageal reflux disease)   . History of Meniere's disease 12/21/2014  . History of thoracic spine surgery (S/P T9-10 IVD spacer) 12/21/2014  . Hypercholesteremia   . Hyperlipidemia   . Hypertension    sees Dr. Jenny Reichmann walker Jefm Bryant  . 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 Myocardial  infarction [facility])    Sees Dr. Drema Dallas, [place] [facility]  . Neuromuscular disorder ([place])    diabetic neuropathy in feet  . Psychosis ([place])   . Short-segment Barrett's esophagus   . Sleep apnea    uses cpap and 2 L o2 at hs    PAST SURGICAL HISTORY:   Past Surgical History:  Procedure Laterality Date  . APPENDECTOMY    . ARTHRODESIS ANTERIOR ANTERIOR CERVICLE SPINE  01/04/2013  . BACK SURGERY     fusion thoracic area  . CARDIAC CATHETERIZATION     may 2012 and Nov 20, 2010  . CARDIAC CATHETERIZATION N/A 06/15/2015   Procedure: Left Heart Cath and Coronary Angiography;  Surgeon: Corey Skains, MD;  Location: [place] CV LAB;  Service: Cardiovascular;  Laterality: N/A;  . CARDIAC CATHETERIZATION N/A 06/15/2015   Procedure: Coronary Stent Intervention;  Surgeon: Isaias Cowman, MD;  Location: [place] CV LAB;  Service: Cardiovascular;  Laterality: N/A;  . CARDIOVASCULAR STRESS TEST     jan 2014  . COLONOSCOPY WITH PROPOFOL N/A 09/19/2014   Procedure: COLONOSCOPY WITH PROPOFOL;  Surgeon: Manya Silvas, MD;  Location: [facility] ENDOSCOPY;  Service: Endoscopy;  Laterality: N/A;  . CORONARY ANGIOPLASTY     stent placement  . ESOPHAGOGASTRODUODENOSCOPY N/A 09/19/2014   Procedure: ESOPHAGOGASTRODUODENOSCOPY (EGD);  Surgeon: Manya Silvas, MD;  Location: [facility] ENDOSCOPY;  Service: Endoscopy;  Laterality: N/A;  . LABRINTHECTOMY  1999 right ear  . mastoid shunt     left, 2002, 1997 right ear, 1980 right ear  . SAVORY DILATION N/A 09/19/2014   Procedure: SAVORY DILATION;  Surgeon: Manya Silvas, MD;  Location: [facility] ENDOSCOPY;  Service: Endoscopy;  Laterality: N/A;  . SHOULDER ARTHROSCOPY WITH SUBACROMIAL DECOMPRESSION Left 04/06/2012   Procedure: SHOULDER ARTHROSCOPY WITH SUBACROMIAL DECOMPRESSION;  Surgeon: Vickey Huger, MD;  Location: [place];  Service: Orthopedics;  Laterality: Left;  left shoulder arthroscopy, subacromial decompression and distal clavicle resection  . SPINAL  CORD STIMULATOR IMPLANT Right     SOCIAL HISTORY:   Social History   Tobacco Use  . Smoking status: Never Smoker  . Smokeless tobacco: Never Used  Substance Use Topics  . Alcohol use: No    FAMILY HISTORY:   Family History  Problem Relation Age of Onset  . Heart disease Mother   . Diabetes Mother   . Heart disease Father   . Cancer Sister   . Heart disease Maternal Aunt   . Heart disease Maternal Uncle   . Diabetes Maternal Grandmother   . Diabetes Paternal Grandmother     DRUG ALLERGIES:  No Known Allergies  REVIEW OF SYSTEMS:   ROS 12 point review of system reviewed positive as per HPI otherwise negative.  MEDICATIONS AT HOME:   Prior to Admission medications   Medication Sig Start Date End Date Taking? Authorizing Provider  aspirin EC 81 MG tablet Take 81 mg by mouth at bedtime.    Yes [provider]  cetirizine (ZYRTEC) 10 MG tablet Take 10 mg by mouth at bedtime.    Yes [provider]  cyclobenzaprine (FLEXERIL) 10 MG tablet Take 1 tablet (10 mg total) by mouth every 8 (eight) hours as needed for muscle spasms. 12/03/17 03/03/18 Yes King, Diona Foley, NP  diazepam (VALIUM) 5 MG tablet Take 5 mg by mouth 3 (three) times daily as needed (Meniere's disease).    Yes [provider]  diazepam (VALIUM) 5 MG tablet Take 5 mg by mouth daily with breakfast.   Yes [provider]  diltiazem (CARTIA XT) 240 MG 24 hr capsule Take 240 mg by mouth at bedtime.   Yes [provider]  enalapril (VASOTEC) 10 MG tablet Take 10 mg by mouth 2 (two) times daily.   Yes [provider]  esomeprazole (NEXIUM) 40 MG capsule Take 40 mg by mouth 2 (two) times daily.   Yes [provider]  ezetimibe (ZETIA) 10 MG tablet Take 10 mg by mouth at bedtime.  03/26/17  Yes [provider]  gabapentin (NEURONTIN) 800 MG tablet Take 2 tablets (1,600 mg total) by mouth 2 (two) times daily. 12/03/17 03/03/18 Yes King, Diona Foley, NP    HUMULIN R U-500 KWIKPEN 500 UNIT/ML kwikpen Inject 40 Units into the skin 3 (three) times daily with meals. Patient taking differently: Inject 70 Units into the skin 3 (three) times daily with meals. Can adjust according to blood sugar 01/19/18  Yes Max Sane, MD  isosorbide mononitrate (IMDUR) 30 MG 24 hr tablet Take 30 mg by mouth at bedtime.   Yes [provider]  Magnesium Oxide 500 MG TABS Take 500 mg by mouth daily.   Yes [provider]  meclizine (ANTIVERT) 25 MG tablet Take 25 mg by mouth 3 (three) times daily as needed for dizziness.  07/06/15  Yes [provider]  meclizine (ANTIVERT) 25 MG tablet Take 25 mg by mouth daily.    Yes [provider]  metFORMIN (GLUCOPHAGE) 1000 MG tablet Take 1,000 mg by mouth 2 (two) times daily with a meal.   Yes [provider]  metoprolol succinate (TOPROL-XL) 50 MG 24 hr tablet Take 50 mg by mouth 2 (two) times daily. Take with or immediately following a meal.    Yes [provider]  montelukast (SINGULAIR) 10 MG tablet Take 10 mg by mouth every morning.    Yes [provider]  niacin (NIASPAN) 500 MG CR tablet Take 500 mg by mouth at bedtime.   Yes [provider]  Omega-3 Fatty Acids (FISH OIL) 1000 MG CAPS Take 1,200 mg by mouth 2 (two) times daily.    Yes [provider]  Oxycodone HCl 10 MG TABS Take 1 tablet (10 mg total) by mouth every 6 (six) hours as needed. 03/01/18 03/31/18 Yes Vevelyn Francois, NP  prasugrel (EFFIENT) 10 MG TABS Take 10 mg by mouth every morning.    Yes [provider]  ranitidine (ZANTAC) 150 MG tablet Take 150 mg by mouth at bedtime.  02/23/15  Yes [provider]  rosuvastatin (CRESTOR) 20 MG tablet Take 40 mg by mouth at bedtime.    Yes [provider]  triamterene-hydrochlorothiazide (MAXZIDE) 75-50 MG tablet Take 1 tablet by mouth daily.   Yes [provider]  ALPRAZolam Duanne Moron) 0.5 MG tablet Take 0.5 mg by  mouth at bedtime as needed (Meniere's disease).     [provider]  naloxone [facility]) 2 MG/2ML injection Inject content of syringe into thigh muscle. Call 911. 09/11/17   Vevelyn Francois, NP  nitroGLYCERIN (NITROSTAT) 0.4 MG SL tablet Place 0.4 mg under the tongue every 5 (five) minutes as needed for chest pain.    [provider]  Oxycodone HCl 10 MG TABS Take 1 tablet (10 mg total) by mouth every 6 (six) hours as needed. 01/30/18 03/01/18  Vevelyn Francois, NP      VITAL SIGNS:  Blood pressure 126/63, pulse 84, temperature 98.7 F (37.1 C), temperature source Oral, resp. rate 16, height 5' 9"  (1.753 m), weight 102 kg, SpO2 96 %.  PHYSICAL EXAMINATION:  Physical Exam  GENERAL:  63 y.o.-year-old patient lying in the bed with no acute distress.  EYES: Pupils equal, round, reactive to light and accommodation. No scleral icterus. Extraocular muscles intact.  HEENT: Head atraumatic, normocephalic. Oropharynx and nasopharynx clear.  NECK:  Supple, no jugular venous distention. No thyroid enlargement, no tenderness.  LUNGS: Normal breath sounds bilaterally, no wheezing, rales,rhonchi or crepitation. No use of accessory muscles of respiration.  CARDIOVASCULAR: S1, S2 normal. No murmurs, rubs, or gallops.  ABDOMEN: Soft, nontender, nondistended. Bowel sounds present. No organomegaly or mass.  EXTREMITIES: No pedal edema, cyanosis, or clubbing.  NEUROLOGIC: Cranial nerves II through XII are intact. Muscle strength 5/5 in all extremities. Sensation intact. Gait not checked.  PSYCHIATRIC: The patient is alert and oriented x 3.  SKIN: No obvious rash, lesion, or ulcer.   LABORATORY PANEL:   CBC Recent Labs  Lab 02/18/18 2036  WBC 12.5*  HGB 14.3  HCT 44.5  PLT 308   ------------------------------------------------------------------------------------------------------------------  Chemistries  Recent Labs  Lab 02/18/18 2036  NA 132*  K 6.6*  CL 99  CO2 24  GLUCOSE  141*  BUN 71*  CREATININE 2.76*  CALCIUM 9.8   ------------------------------------------------------------------------------------------------------------------  Cardiac Enzymes No results for input(s): TROPONINI in the last 168 hours. ------------------------------------------------------------------------------------------------------------------  RADIOLOGY:  No results found.    IMPRESSION AND PLAN:  1. acute hyperkalemia -admit to medical floor with cardiac monitor -patient received treatment for hyperkalemia in the ER.  Repeat BMP ordered.. -Started on IV fluids -hold lisinopril and triamterene hydrochlorothiazide -follow-up BMP.   2. AKI : Likely related to dehydration.  IV hydration.  Hold nephrotoxic agent.  Monitor BMP.  3. generalized weakness with frequent falls secondary to balance issues from meniere's dz -continue PRN meclizine, Valium  4. CAD -continue cardiac meds  5. Chronic other medical problems: Monitor.  Continue home medication as ordered   DVT prophylaxis subcu heparin  Moderate to high risk secondary to above  Estimated length of stay more than 2 midnights  Further treatment based on clinical course.  Case discussed with patient and family who is in agreement.     All the records are reviewed and case discussed with ED provider. Management plans discussed with the patient, family and they are in agreement.  CODE STATUS: Full  TOTAL TIME TAKING CARE OF THIS PATIENT: 45 minutes.    Sedalia Muta M.D on 02/19/2018 at 1:46 AM  Between 7am to 6pm - Pager - [phone]  After 6pm go to [email or web address] - Proofreader  [facility] [place] [facility]  Office  [phone]  CC: Primary care physician; [name], MD

## 2018-02-20 DIAGNOSIS — R531 Weakness: Secondary | ICD-10-CM | POA: Diagnosis not present

## 2018-02-20 DIAGNOSIS — N179 Acute kidney failure, unspecified: Secondary | ICD-10-CM | POA: Diagnosis not present

## 2018-02-20 LAB — BASIC METABOLIC PANEL
ANION GAP: 10 (ref 5–15)
BUN: 34 mg/dL — ABNORMAL HIGH (ref 8–23)
CO2: 25 mmol/L (ref 22–32)
Calcium: 10.1 mg/dL (ref 8.9–10.3)
Chloride: 107 mmol/L (ref 98–111)
Creatinine, Ser: 1.49 mg/dL — ABNORMAL HIGH (ref 0.61–1.24)
GFR calc Af Amer: 57 mL/min — ABNORMAL LOW (ref >60)
GFR calc non Af Amer: 49 mL/min — ABNORMAL LOW (ref >60)
Glucose, Bld: 332 mg/dL — ABNORMAL HIGH (ref 70–99)
Potassium: 4.2 mmol/L (ref 3.5–5.1)
Sodium: 142 mmol/L (ref 135–145)

## 2018-02-20 LAB — GLUCOSE, CAPILLARY: Glucose-Capillary: 291 mg/dL — ABNORMAL HIGH (ref 70–99)

## 2018-02-20 LAB — HIV ANTIBODY (ROUTINE TESTING W REFLEX): HIV Screen 4th Generation wRfx: NONREACTIVE

## 2018-02-20 MED ORDER — INSULIN GLARGINE 100 UNIT/ML ~~LOC~~ SOLN
25.0000 [IU] | Freq: Every day | SUBCUTANEOUS | Status: DC
  Filled 2018-02-20: qty 0.25

## 2018-02-20 MED ORDER — INSULIN GLARGINE 100 UNIT/ML ~~LOC~~ SOLN
35.0000 [IU] | Freq: Every day | SUBCUTANEOUS | Status: DC
  Administered 2018-02-20: 35 [IU] via SUBCUTANEOUS
  Filled 2018-02-20: qty 0.35

## 2018-02-20 MED ORDER — INSULIN ASPART 100 UNIT/ML ~~LOC~~ SOLN: 10.0000 [IU] | Freq: Three times a day (TID) | SUBCUTANEOUS | Status: DC

## 2018-02-20 NOTE — Progress Notes (Signed)
Inpatient Diabetes Program Recommendations  AACE/ADA: New Consensus Statement on Inpatient Glycemic Control (2019)  Target Ranges:  Prepandial:   less than 140 mg/dL      Peak postprandial:   less than 180 mg/dL (1-2 hours)      Critically ill patients:  140 - 180 mg/dL   Results for ZAYDRIAN, BATTA (MRN [id number]) as of 02/20/2018 07:38  Ref. Range 02/19/2018 07:39 02/19/2018 12:06 02/19/2018 17:15 02/19/2018 21:04 02/20/2018 07:25  Glucose-Capillary Latest Ref Range: 70 - 99 mg/dL 88 248 (H) 262 (H) 342 (H) 291 (H)   Review of Glycemic Control  Diabetes history: DM2 Outpatient Diabetes medications: Humulin R U500 40-70 units TID with meals, Metformin 1000 mg BID Current orders for Inpatient glycemic control: Lantus 25 units daily, Novolog 4 units TID with meals for meal coverage, Novolog 0-15 units TID with meals, Novolog 0-5 units QHS  Inpatient Diabetes Program Recommendations:   Insulin - Basal: Please consider increasing Lantus to 35 units Q24H.  Insulin - Meal Coverage: Please consider increasing meal coverage to Novolog 10 units TID with meals if patient eats at least 50% of meals.  Thanks, Barnie Alderman, RN, MSN, CDE Diabetes Coordinator Inpatient Diabetes Program [phone] (Team Pager from 8am to 5pm)

## 2018-02-20 NOTE — Plan of Care (Signed)
  Problem: Health Behavior/Discharge Planning: Goal: Ability to manage health-related needs will improve Outcome: Adequate for Discharge   Problem: Clinical Measurements: Goal: Ability to maintain clinical measurements within normal limits will improve Outcome: Adequate for Discharge Goal: Will remain free from infection Outcome: Adequate for Discharge Goal: Diagnostic test results will improve Outcome: Adequate for Discharge Goal: Respiratory complications will improve Outcome: Adequate for Discharge Goal: Cardiovascular complication will be avoided Outcome: Adequate for Discharge   Problem: Activity: Goal: Risk for activity intolerance will decrease Outcome: Adequate for Discharge   Problem: Pain Managment: Goal: General experience of comfort will improve Outcome: Adequate for Discharge   Problem: Safety: Goal: Ability to remain free from injury will improve Outcome: Adequate for Discharge   Problem: Skin Integrity: Goal: Risk for impaired skin integrity will decrease Outcome: Adequate for Discharge

## 2018-02-20 NOTE — Progress Notes (Signed)
Patient given discharge instructions with family at bedside. IV taken out and tele monitor off. Patient and wife verbalized understanding with no further questions or concerns. No prescriptions. Patient going home via family vehicle.

## 2018-02-20 NOTE — Discharge Summary (Signed)
[place] at [place] NAME: Kenneth Osborn    MR#:  [id number]  DATE OF BIRTH:  [birth year]  DATE OF ADMISSION:  02/18/2018   ADMITTING PHYSICIAN: Sedalia Muta, MD  DATE OF DISCHARGE: 02/20/2018 11:40 AM  PRIMARY CARE PHYSICIAN: [name], MD   ADMISSION DIAGNOSIS:  Hyperkalemia [E87.5] Renal insufficiency [N28.9] Generalized weakness [R53.1] DISCHARGE DIAGNOSIS:  Active Problems:   AKI (acute kidney injury) ([place])  SECONDARY DIAGNOSIS:   Past Medical History:  Diagnosis Date  . Allergic rhinitis 12/30/2012  . Bronchitis    hx of  . Can't get food down 08/12/2014  . Chronic back pain    thoracic area  . Concussion 09/2015  . COPD (chronic obstructive pulmonary disease) ([place])   . Coronary artery disease    99% blockage  . DDD (degenerative disc disease), cervical   . Dehydration symptoms    2019  . 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 Dysphagia   . GERD (gastroesophageal reflux disease)   . History of Meniere's disease 12/21/2014  . History of thoracic spine surgery (S/P T9-10 IVD spacer) 12/21/2014  . Hypercholesteremia   . Hyperlipidemia   . Hypertension    sees Dr. Jenny Reichmann walker Jefm Bryant  . 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 Myocardial infarction [facility])    Sees Dr. Drema Dallas, [place] [facility]  . Neuromuscular disorder ([place])    diabetic neuropathy in feet  . Psychosis ([place])   . Short-segment Barrett's esophagus   . Sleep apnea    uses cpap and 2 L o2 at hs   HOSPITAL COURSE:  1.acute hyperkalemia -patient received treatment for hyperkalemia in the ER. -hold enalapril and triamterene hydrochlorothiazide Improved.  2. AKI :Likely related to dehydration.  Improving with IV hydration. Hold Maxide and enalapril, fllow-up BMP with PCP.  3.generalized weakness with frequent falls secondary to balance issues from meniere's dz -continue PRN meclizine,  Valium The patient is advised to avoid narcotics, Flexeril and hypertension medication the same time. Home health and PT.  4.CAD -continue cardiac meds  5. Chronic other medical problems: Monitor.Continue home medication as ordered  Diabetes.  The patient is treated with Lantus and sliding scale in the hospital resume home medication after discharge.  But hold metformin due to renal failure.  Follow-up PCP to resume metformin. Hypertension.  Continue home hypertension medication but hold enalapril and Maxide. DISCHARGE CONDITIONS:  Stable, discharged to home with home health and PT today. CONSULTS OBTAINED:   DRUG ALLERGIES:  No Known Allergies DISCHARGE MEDICATIONS:   Allergies as of 02/20/2018   No Known Allergies     Medication List    STOP taking these medications   enalapril 10 MG tablet Commonly known as:  VASOTEC   metFORMIN 1000 MG tablet Commonly known as:  GLUCOPHAGE   triamterene-hydrochlorothiazide 75-50 MG tablet Commonly known as:  MAXZIDE     TAKE these medications   ALPRAZolam 0.5 MG tablet Commonly known as:  XANAX Take 0.5 mg by mouth at bedtime as needed (Meniere's disease).   aspirin EC 81 MG tablet Take 81 mg by mouth at bedtime.   CARTIA XT 240 MG 24 hr capsule Generic drug:  diltiazem Take 240 mg by mouth at bedtime.   cetirizine 10 MG tablet Commonly known as:  ZYRTEC Take 10 mg by mouth at bedtime.   cyclobenzaprine 10 MG tablet Commonly known as:  FLEXERIL Take 1 tablet (10  mg total) by mouth every 8 (eight) hours as needed for muscle spasms.   diazepam 5 MG tablet Commonly known as:  VALIUM Take 5 mg by mouth daily with breakfast.   diazepam 5 MG tablet Commonly known as:  VALIUM Take 5 mg by mouth 3 (three) times daily as needed (Meniere's disease).   esomeprazole 40 MG capsule Commonly known as:  NEXIUM Take 40 mg by mouth 2 (two) times daily.   ezetimibe 10 MG tablet Commonly known as:  ZETIA Take 10 mg by mouth at  bedtime.   Fish Oil 1000 MG Caps Take 1,200 mg by mouth 2 (two) times daily.   gabapentin 800 MG tablet Commonly known as:  NEURONTIN Take 2 tablets (1,600 mg total) by mouth 2 (two) times daily.   HUMULIN R U-500 KWIKPEN 500 UNIT/ML kwikpen Generic drug:  insulin regular human CONCENTRATED Inject 40 Units into the skin 3 (three) times daily with meals. What changed:    how much to take  additional instructions   isosorbide mononitrate 30 MG 24 hr tablet Commonly known as:  IMDUR Take 30 mg by mouth at bedtime.   Magnesium Oxide 500 MG Tabs Take 500 mg by mouth daily.   meclizine 25 MG tablet Commonly known as:  ANTIVERT Take 25 mg by mouth daily.   meclizine 25 MG tablet Commonly known as:  ANTIVERT Take 25 mg by mouth 3 (three) times daily as needed for dizziness.   metoprolol succinate 50 MG 24 hr tablet Commonly known as:  TOPROL-XL Take 50 mg by mouth 2 (two) times daily. Take with or immediately following a meal.   montelukast 10 MG tablet Commonly known as:  SINGULAIR Take 10 mg by mouth every morning.   naloxone 2 MG/2ML injection Commonly known as:  NARCAN Inject content of syringe into thigh muscle. Call 911.   niacin 500 MG CR tablet Commonly known as:  NIASPAN Take 500 mg by mouth at bedtime.   nitroGLYCERIN 0.4 MG SL tablet Commonly known as:  NITROSTAT Place 0.4 mg under the tongue every 5 (five) minutes as needed for chest pain.   Oxycodone HCl 10 MG Tabs Take 1 tablet (10 mg total) by mouth every 6 (six) hours as needed.   Oxycodone HCl 10 MG Tabs Take 1 tablet (10 mg total) by mouth every 6 (six) hours as needed. Start taking on:  March 01, 2018   prasugrel 10 MG Tabs tablet Commonly known as:  EFFIENT Take 10 mg by mouth every morning.   ranitidine 150 MG tablet Commonly known as:  ZANTAC Take 150 mg by mouth at bedtime.   rosuvastatin 20 MG tablet Commonly known as:  CRESTOR Take 40 mg by mouth at bedtime.         DISCHARGE INSTRUCTIONS:  See AVS.  If you experience worsening of your admission symptoms, develop shortness of breath, life threatening emergency, suicidal or homicidal thoughts you must seek medical attention immediately by calling 911 or calling your MD immediately  if symptoms less severe.  You Must read complete instructions/literature along with all the possible adverse reactions/side effects for all the Medicines you take and that have been prescribed to you. Take any new Medicines after you have completely understood and accpet all the possible adverse reactions/side effects.   Please note  You were cared for by a hospitalist during your hospital stay. If you have any questions about your discharge medications or the care you received while you were in the hospital after  you are discharged, you can call the unit and asked to speak with the hospitalist on call if the hospitalist that took care of you is not available. Once you are discharged, your primary care physician will handle any further medical issues. Please note that NO REFILLS for any discharge medications will be authorized once you are discharged, as it is imperative that you return to your primary care physician (or establish a relationship with a primary care physician if you do not have one) for your aftercare needs so that they can reassess your need for medications and monitor your lab values.    On the day of Discharge:  VITAL SIGNS:  Blood pressure (!) 133/95, pulse (!) 102, temperature 97.6 F (36.4 C), temperature source Oral, resp. rate 18, height 5' 9"  (1.753 m), weight 96.6 kg, SpO2 98 %. PHYSICAL EXAMINATION:  GENERAL:  63 y.o.-year-old patient lying in the bed with no acute distress.  EYES: Pupils equal, round, reactive to light and accommodation. No scleral icterus. Extraocular muscles intact.  HEENT: Head atraumatic, normocephalic. Oropharynx and nasopharynx clear.  NECK:  Supple, no jugular venous  distention. No thyroid enlargement, no tenderness.  LUNGS: Normal breath sounds bilaterally, no wheezing, rales,rhonchi or crepitation. No use of accessory muscles of respiration.  CARDIOVASCULAR: S1, S2 normal. No murmurs, rubs, or gallops.  ABDOMEN: Soft, non-tender, non-distended. Bowel sounds present. No organomegaly or mass.  EXTREMITIES: No pedal edema, cyanosis, or clubbing.  NEUROLOGIC: Cranial nerves II through XII are intact. Muscle strength 4/5 in all extremities. Sensation intact. Gait not checked.  PSYCHIATRIC: The patient is alert and oriented x 3.  SKIN: No obvious rash, lesion, or ulcer.  DATA REVIEW:   CBC Recent Labs  Lab 02/19/18 0453  WBC 9.8  HGB 12.4*  HCT 38.4*  PLT 235    Chemistries  Recent Labs  Lab 02/20/18 0825  NA 142  K 4.2  CL 107  CO2 25  GLUCOSE 332*  BUN 34*  CREATININE 1.49*  CALCIUM 10.1     Microbiology Results  Results for orders placed or performed during the hospital encounter of 01/18/18  Group A Strep by PCR ([place] Only)     Status: None   Collection Time: 01/18/18 12:38 PM  Result Value Ref Range Status   Group A Strep by PCR NOT DETECTED NOT DETECTED Final    Comment: Performed at [facility], [address] [place], [place] [address]  Urine Culture     Status: None   Collection Time: 01/18/18  1:48 PM  Result Value Ref Range Status   Specimen Description   Final    URINE, RANDOM Performed at [facility], [address] [place], [place] [address]    Special Requests   Final    NONE Performed at [facility], [address], [place], [place] [address]    Culture   Final    NO GROWTH Performed at [facility], [place] [address] [place], [place] [address]    Report Status 01/19/2018 FINAL  Final    RADIOLOGY:  No results found.   Management plans discussed with the patient, his wife and daughter and they are in agreement.  CODE STATUS: DNR   TOTAL TIME TAKING CARE OF THIS  PATIENT: 35 minutes.    Demetrios Loll M.D on 02/20/2018 at 12:26 PM  Between 7am to 6pm - Pager - [phone]  After 6pm go to [email or web address] - [facility]  Office  [phone]  CC: Primary care physician; [name], MD   Note: This dictation was prepared with Dragon dictation along with smaller phrase technology. Any transcriptional errors that result from this process are unintentional.

## 2018-02-20 NOTE — Plan of Care (Signed)
  Problem: Activity: Goal: Risk for activity intolerance will decrease Outcome: Progressing Note:  Up with standby assist to side of bed to use urinal, tolerating well   Problem: Pain Managment: Goal: General experience of comfort will improve Outcome: Progressing Note:  No complaints of pain this shift   Problem: Safety: Goal: Ability to remain free from injury will improve Outcome: Progressing Note:  Low bed in place   Problem: Skin Integrity: Goal: Risk for impaired skin integrity will decrease Outcome: Progressing

## 2018-02-20 NOTE — Discharge Instructions (Signed)
HHPT Fall precaution Avoid narcotics, flexeril if possible, don't take with HTN meds at the same time.

## 2018-02-20 NOTE — Care Management Note (Signed)
Case Management Note  Patient Details  Name: Kenneth Osborn MRN: [id number] Date of Birth: 1956-01-06  Subjective/Objective:      Referral made to [facility] for PT/safe strides.  Helene Kelp aware of patient discharge today.                Action/Plan:   Expected Discharge Date:  02/20/18               Expected Discharge Plan:  [place]  In-House Referral:     Discharge planning Services  CM Consult  Post Acute Care Choice:  Home Health Choice offered to:  Spouse, Patient  DME Arranged:    DME Agency:  [facility] (now [facility])  [place] Arranged:  PT(Safe strides) [place] Agency:  [facility] (now [facility])  Status of Service:  Completed, signed off  If discussed at [employer], dates discussed:    Additional Comments:  Elza Rafter, RN 02/20/2018, 11:40 AM

## 2018-03-18 ENCOUNTER — Ambulatory Visit: Payer: Medicare Other | Attending: Nurse Practitioner | Admitting: Nurse Practitioner

## 2018-03-18 ENCOUNTER — Encounter: Payer: Self-pay | Admitting: Nurse Practitioner

## 2018-03-18 VITALS — BP 135/72 | HR 83 | Temp 98.4°F | Resp 16 | Ht 69.0 in | Wt 225.0 lb

## 2018-03-18 DIAGNOSIS — M792 Neuralgia and neuritis, unspecified: Secondary | ICD-10-CM | POA: Insufficient documentation

## 2018-03-18 DIAGNOSIS — M4802 Spinal stenosis, cervical region: Secondary | ICD-10-CM | POA: Diagnosis not present

## 2018-03-18 DIAGNOSIS — G894 Chronic pain syndrome: Secondary | ICD-10-CM | POA: Diagnosis not present

## 2018-03-18 DIAGNOSIS — M7918 Myalgia, other site: Secondary | ICD-10-CM | POA: Diagnosis present

## 2018-03-18 DIAGNOSIS — Z79891 Long term (current) use of opiate analgesic: Secondary | ICD-10-CM

## 2018-03-18 DIAGNOSIS — M47816 Spondylosis without myelopathy or radiculopathy, lumbar region: Secondary | ICD-10-CM | POA: Insufficient documentation

## 2018-03-18 DIAGNOSIS — E0842 Diabetes mellitus due to underlying condition with diabetic polyneuropathy: Secondary | ICD-10-CM | POA: Insufficient documentation

## 2018-03-18 MED ORDER — CYCLOBENZAPRINE HCL 10 MG PO TABS: 10.0000 mg | ORAL_TABLET | Freq: Three times a day (TID) | ORAL | 0 refills | Status: DC | PRN

## 2018-03-18 MED ORDER — OXYCODONE HCL 10 MG PO TABS: 10.0000 mg | ORAL_TABLET | Freq: Four times a day (QID) | ORAL | 0 refills | Status: DC | PRN

## 2018-03-18 MED ORDER — GABAPENTIN 800 MG PO TABS: 1600.0000 mg | ORAL_TABLET | Freq: Two times a day (BID) | ORAL | 2 refills | Status: DC

## 2018-03-18 NOTE — Progress Notes (Signed)
Patient's Name: Kenneth Osborn  MRN: [id number]  Referring Provider: Ellene Route  DOB: 1955-08-10  PCP: [name], MD  DOS: 03/18/2018  Note by: Vevelyn Francois NP  Service setting: Ambulatory outpatient  Specialty: Interventional Pain Management  Location: [facility] (AMB) Pain Management Facility    Patient type: Established    Primary Reason(s) for Visit: Encounter for prescription drug management. (Level of risk: moderate)  CC: Back Pain (mid to lower left is worse ) and Neck Pain (s/p titanium plate and fusion K2,I0)  HPI  [name] is a 63 y.o. year old, male patient, who comes today for a medication management evaluation. 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 Beverely Pace at [facility] on 01/04/2013); Long term current use of opiate analgesic; Long term prescription opiate use; Opiate use (60 MME/Day); Opiate dependence ([place]); Encounter for therapeutic drug level monitoring; Neurogenic pain; Thoracic facet syndrome (T8-10); Lumbar facet syndrome (Bilateral) (R>L); Cervical facet syndrome (Right); Cervical spondylosis; Lumbar spondylosis; Chronic upper extremity pain (Left); Chronic cervical radicular pain (Left); Chronic upper back pain; History of thoracic spine surgery (S/P T9-10 IVD spacer); Failed back surgical syndrome; Chronic musculoskeletal pain; Myofascial pain; Chronic lower extremity pain (Left); Chronic radicular lumbar pain (left L4 dermatomal pain); Coronary artery disease; History of MI (myocardial infarction) (May 2012); Chronic anticoagulation ([place]); History of Meniere's disease;  Sleep apnea; Type 2 diabetes mellitus with hyperglycemia ([place]); Encounter for interrogation of neurostimulator; [employer] spinal cord stimulator (implant date: 11/01/2014); Unstable angina ([place]); Diabetes mellitus, type II ([place]); Arthralgia of shoulder; CAD (coronary artery disease); Paroxysmal supraventricular tachycardia ([place]); Balance problem; Chronic tension-type headache, not intractable; Dizziness; Post-concussion headache; Mild cognitive impairment; 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); Bilateral carotid artery stenosis; Uncontrolled type 2 diabetes mellitus with hyperglycemia, with long-term current use of insulin ([place]); DM type 2 with diabetic peripheral neuropathy ([place]); Chronic pain syndrome; Numbness and tingling; Tachycardia with heart rate 100-120 beats per minute; Tremor; Meniere disease, bilateral; CKD (chronic kidney disease) stage 3, GFR 30-59 ml/min ([place]); Headache disorder; Type 2 diabetes mellitus with diabetic neuropathy ([place]); Trigger point posterior superior iliac spine (PSIS) (Right); 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); Spinal cord stimulator dysfunction ([place]); Polyneuropathy; GERD (gastroesophageal reflux disease); Cardiac syncope; History of coronary artery disease; Dysphagia; Chronic back pain; Hypertension associated with diabetes ([place]); Type 2 diabetes mellitus with diabetic polyneuropathy, with long-term current use of insulin ([place]); Hyperlipidemia associated with type 2 diabetes mellitus ([place]); Spondylosis without myelopathy or radiculopathy, lumbosacral region; Hyperkalemia; AKI (acute kidney injury) ([place]); and Frequent falls on their problem list. His primarily concern today is the Back Pain (mid to lower left is worse ) and Neck Pain (s/p titanium plate and fusion X7,D5)  Pain Assessment: Location: Mid, Lower, Left Back(neck) Radiating: back pain goes down the legs to approx the knees, thoracic pain s/p fusion T8,T9.  Onset: More than a month  ago Duration: Chronic pain Quality: Sharp, Discomfort, Dull, Aching, Stabbing, Constant(states he turned to look to the left and felt like a nice sliced him from right to left in his neck.  wondering if something is going on with fusion ) Severity: 3 /10 (subjective, self-reported pain score)  Note: Reported level is compatible with  observation.                          Effect on ADL: limited ROM. medications allow him to do more during the day,  afternoons are difficult  Timing: Constant Modifying factors: medications, SCS that is not on BP: 135/72  HR: 83  Mr. Biskup was last scheduled for an appointment on 12/03/2017 for medication management. During today's appointment we reviewed Mr. Ogburn's chronic pain status, as well as his outpatient medication regimen.  The patient  reports no history of drug use. His body mass index is 33.23 kg/m.  Further details on both, my assessment(s), as well as the proposed treatment plan, please see below.  Controlled Substance Pharmacotherapy Assessment REMS (Risk Evaluation and Mitigation Strategy)  Analgesic:Oxycodone IR 10 mg every 6 hours (40 mg/day) MME/day:60 mg/day Janett Billow, RN  03/18/2018  9:05 AM  Sign when Signing Visit Nursing Pain Medication Assessment:  Safety precautions to be maintained throughout the outpatient stay will include: orient to surroundings, keep bed in low position, maintain call bell within reach at all times, provide assistance with transfer out of bed and ambulation.  Medication Inspection Compliance: Pill count conducted under aseptic conditions, in front of the patient. Neither the pills nor the bottle was removed from the patient's sight at any time. Once count was completed pills were immediately returned to the patient in their original bottle.  Medication: Oxycodone IR Pill/Patch Count: 88 of 120 pills remain Pill/Patch Appearance: Markings consistent with prescribed medication Bottle Appearance:  Standard pharmacy container. Clearly labeled. Filled Date: 01 / 18 / 2020 Last Medication intake:  Today   Pharmacokinetics: Liberation and absorption (onset of action): WNL Distribution (time to peak effect): WNL Metabolism and excretion (duration of action): WNL         Pharmacodynamics: Desired effects: Analgesia: Mr. Popper reports >50% benefit. Functional ability: Patient reports that medication allows him to accomplish basic ADLs Clinically meaningful improvement in function (CMIF): Sustained CMIF goals met Perceived effectiveness: Described as relatively effective, allowing for increase in activities of daily living (ADL) Undesirable effects: Side-effects or Adverse reactions: None reported Monitoring: [place] PMP: Online review of the past 25-monthperiod conducted. Compliant with practice rules and regulations Last UDS on record: Summary  Date Value Ref Range Status  05/27/2017 FINAL  Final    Comment:    ==================================================================== TOXASSURE SELECT 13 (MW) ==================================================================== Test                             Result       Flag       Units Drug Present and Declared for Prescription Verification   Desmethyldiazepam              177          EXPECTED   ng/mg creat   Oxazepam                       766          EXPECTED   ng/mg creat   Temazepam                      331          EXPECTED   ng/mg creat    Desmethyldiazepam, oxazepam, and temazepam are expected    metabolites of diazepam. Desmethyldiazepam and oxazepam are also  expected metabolites of other drugs, including chlordiazepoxide,    prazepam, clorazepate, and halazepam. Oxazepam is an expected    metabolite of temazepam. Oxazepam and temazepam are also    available as scheduled prescription medications.   Oxycodone                      1616         EXPECTED   ng/mg creat   Oxymorphone                    5968         EXPECTED    ng/mg creat   Noroxycodone                   1338         EXPECTED   ng/mg creat   Noroxymorphone                 2197         EXPECTED   ng/mg creat    Sources of oxycodone are scheduled prescription medications.    Oxymorphone, noroxycodone, and noroxymorphone are expected    metabolites of oxycodone. Oxymorphone is also available as a    scheduled prescription medication. Drug Absent but Declared for Prescription Verification   Alprazolam                     Not Detected UNEXPECTED ng/mg creat ==================================================================== Test                      Result    Flag   Units      Ref Range   Creatinine              115              mg/dL      >=20 ==================================================================== Declared Medications:  The flagging and interpretation on this report are based on the  following declared medications.  Unexpected results may arise from  inaccuracies in the declared medications.  **Note: The testing scope of this panel includes these medications:  Alprazolam (Xanax)  Diazepam (Valium)  Oxycodone  **Note: The testing scope of this panel does not include following  reported medications:  Aspirin  Cetirizine (Zyrtec)  Cyclobenzaprine  Diltiazem (Cardizem)  Docusate (Colace)  Enalapril (Vasotec)  Ezetimibe (Zetia)  Gabapentin  Hydrochlorothiazide (Maxzide)  Insulin (Humulin)  Magnesium  Magnesium (Mag-Ox)  Meclizine (Antivert)  Metformin  Methylprednisolone (Medrol Dose Pack)  Metoprolol  Montelukast (Singulair)  Naloxone (Narcan)  Niacin (Niaspan)  Omega-3 Fatty Acids (Fish Oil)  Omeprazole (Nexium)  Prasugrel (Effient)  Ranitidine (Zantac)  Rosuvastatin (Crestor)  Triamterene (Maxzide) ==================================================================== For clinical consultation, please call [phone]. ====================================================================    UDS interpretation:  Compliant Patient informed of the CDC guidelines and recommendations to stay away from the concomitant use of benzodiazepines and opioids due to the increased risk of respiratory depression and death. Medication Assessment Form: Reviewed. Patient indicates being compliant with therapy Treatment compliance: Compliant Risk Assessment Profile: Aberrant behavior: See prior evaluations. None observed or detected today Comorbid factors increasing risk of overdose: Benzodiazepine use, caucasian and kidney disease Opioid risk tool (ORT) (Total Score): 0 Risk of substance use disorder (SUD): Low-to-Moderate Opioid Risk Tool - 03/18/18 0959      Family History of Substance Abuse   Alcohol  Negative    Illegal Drugs  Negative    Rx Drugs  Negative  Personal History of Substance Abuse   Alcohol  Negative    Illegal Drugs  Negative    Rx Drugs  Negative      Age   Age between 106-45 years   No      History of Preadolescent Sexual Abuse   History of Preadolescent Sexual Abuse  Negative or Male      Psychological Disease   Psychological Disease  Negative    Depression  Negative      Total Score   Opioid Risk Tool Scoring  0    Opioid Risk Interpretation  Low Risk      ORT Scoring interpretation table:  Score <3 = Low Risk for SUD  Score between 4-7 = Moderate Risk for SUD  Score >8 = High Risk for Opioid Abuse   Risk Mitigation Strategies:  Patient Counseling: Covered Patient-Prescriber Agreement (PPA): Present and active  Notification to other healthcare providers: Done  Pharmacologic Plan: No change in therapy, at this time.             Laboratory Chemistry  Inflammation Markers (CRP: Acute Phase) (ESR: Chronic Phase) Lab Results  Component Value Date   CRP 1.4 (H) 03/22/2015   ESRSEDRATE 9 03/22/2015                         Rheumatology Markers No results found for: RF, ANA, LABURIC, URICUR, LYMEIGGIGMAB, LYMEABIGMQN, HLAB27                      Renal Function  Markers Lab Results  Component Value Date   BUN 34 (H) 02/20/2018   CREATININE 1.49 (H) 02/20/2018   GFRAA 57 (L) 02/20/2018   GFRNONAA 49 (L) 02/20/2018                             Hepatic Function Markers Lab Results  Component Value Date   AST 31 01/18/2018   ALT 33 01/18/2018   ALBUMIN 4.1 01/18/2018   ALKPHOS 68 01/18/2018                        Electrolytes Lab Results  Component Value Date   NA 142 02/20/2018   K 4.2 02/20/2018   CL 107 02/20/2018   CALCIUM 10.1 02/20/2018   MG 1.7 03/22/2015                        Neuropathy Markers Lab Results  Component Value Date   HIV Non Reactive 02/19/2018                        CNS Tests No results found for: COLORCSF, APPEARCSF, RBCCOUNTCSF, WBCCSF, POLYSCSF, LYMPHSCSF, EOSCSF, PROTEINCSF, GLUCCSF, JCVIRUS, CSFOLI, IGGCSF                      Bone Pathology Markers No results found for: VD25OH, VD125OH2TOT, [id number], [id number], 25OHVITD1, 25OHVITD2, 25OHVITD3, TESTOFREE, TESTOSTERONE                       Coagulation Parameters Lab Results  Component Value Date   INR 1.0 02/23/2014   LABPROT 12.9 02/23/2014   APTT 25.9 08/18/2013   PLT 235 02/19/2018  Cardiovascular Markers Lab Results  Component Value Date   CKTOTAL 155 02/23/2014   CKMB 3.3 02/23/2014   TROPONINI <0.03 01/18/2018   HGB 12.4 (L) 02/19/2018   HCT 38.4 (L) 02/19/2018                         CA Markers No results found for: CEA, CA125, LABCA2                      Note: Lab results reviewed.  Recent Diagnostic Imaging Results  CT Head Wo Contrast CLINICAL DATA:  Multiple falls.  EXAM: CT HEAD WITHOUT CONTRAST  TECHNIQUE: Contiguous axial images were obtained from the base of the skull through the vertex without intravenous contrast.  COMPARISON:  09/03/2017  FINDINGS: Brain: No evidence of acute infarction, hemorrhage, hydrocephalus, extra-axial collection or mass lesion/mass effect. Moderate  brain parenchymal volume loss.  Vascular: No hyperdense vessel or unexpected calcification.  Skull: Normal. Negative for fracture or focal lesion.  Sinuses/Orbits: No acute finding.  Other: None.  IMPRESSION: No acute intracranial abnormality.  Moderate brain parenchymal volume loss.  Electronically Signed   By: Fidela Salisbury M.D.   On: 01/18/2018 13:38  Complexity Note: Imaging results reviewed. Results shared with Mr. Brimage, using Layman's terms.                         Meds   Current Outpatient Medications:  .  ALPRAZolam (XANAX) 0.5 MG tablet, Take 0.5 mg by mouth at bedtime as needed (Meniere's disease). , Disp: , Rfl:  .  aspirin EC 81 MG tablet, Take 81 mg by mouth at bedtime. , Disp: , Rfl:  .  cetirizine (ZYRTEC) 10 MG tablet, Take 10 mg by mouth at bedtime. , Disp: , Rfl:  .  diazepam (VALIUM) 5 MG tablet, Take 5 mg by mouth 3 (three) times daily as needed (Meniere's disease). , Disp: , Rfl:  .  diltiazem (CARTIA XT) 240 MG 24 hr capsule, Take 240 mg by mouth at bedtime., Disp: , Rfl:  .  esomeprazole (NEXIUM) 40 MG capsule, Take 40 mg by mouth 2 (two) times daily., Disp: , Rfl:  .  ezetimibe (ZETIA) 10 MG tablet, Take 10 mg by mouth at bedtime. , Disp: , Rfl:  .  HUMULIN R U-500 KWIKPEN 500 UNIT/ML kwikpen, Inject 40 Units into the skin 3 (three) times daily with meals. (Patient taking differently: Inject 70 Units into the skin 3 (three) times daily with meals. Can adjust according to blood sugar), Disp: , Rfl: 0 .  isosorbide mononitrate (IMDUR) 30 MG 24 hr tablet, Take 30 mg by mouth at bedtime., Disp: , Rfl:  .  Magnesium Oxide 500 MG TABS, Take 500 mg by mouth daily., Disp: , Rfl:  .  meclizine (ANTIVERT) 25 MG tablet, Take 25 mg by mouth 3 (three) times daily as needed for dizziness. , Disp: , Rfl:  .  metFORMIN (GLUCOPHAGE) 1000 MG tablet, Take 1,000 mg by mouth 2 (two) times daily with a meal., Disp: , Rfl:  .  metoprolol succinate (TOPROL-XL) 50 MG 24 hr  tablet, Take 50 mg by mouth 2 (two) times daily. Take with or immediately following a meal. , Disp: , Rfl:  .  montelukast (SINGULAIR) 10 MG tablet, Take 10 mg by mouth every morning. , Disp: , Rfl:  .  naloxone (NARCAN) 2 MG/2ML injection, Inject content of syringe into thigh muscle. Call  911., Disp: 2 Syringe, Rfl: 1 .  niacin (NIASPAN) 500 MG CR tablet, Take 500 mg by mouth at bedtime., Disp: , Rfl:  .  nitroGLYCERIN (NITROSTAT) 0.4 MG SL tablet, Place 0.4 mg under the tongue every 5 (five) minutes as needed for chest pain., Disp: , Rfl:  .  Omega-3 Fatty Acids (FISH OIL) 1000 MG CAPS, Take 1,200 mg by mouth 2 (two) times daily. , Disp: , Rfl:  .  [START ON 05/30/2018] Oxycodone HCl 10 MG TABS, Take 1 tablet (10 mg total) by mouth every 6 (six) hours as needed for up to 30 days., Disp: 120 tablet, Rfl: 0 .  prasugrel (EFFIENT) 10 MG TABS, Take 10 mg by mouth every morning. , Disp: , Rfl:  .  rosuvastatin (CRESTOR) 20 MG tablet, Take 40 mg by mouth at bedtime. , Disp: , Rfl:  .  [START ON 03/31/2018] cyclobenzaprine (FLEXERIL) 10 MG tablet, Take 1 tablet (10 mg total) by mouth every 8 (eight) hours as needed for muscle spasms., Disp: 270 tablet, Rfl: 0 .  [START ON 03/31/2018] gabapentin (NEURONTIN) 800 MG tablet, Take 2 tablets (1,600 mg total) by mouth 2 (two) times daily., Disp: 120 tablet, Rfl: 2 .  [START ON 04/30/2018] Oxycodone HCl 10 MG TABS, Take 1 tablet (10 mg total) by mouth every 6 (six) hours as needed for up to 30 days., Disp: 120 tablet, Rfl: 0 .  [START ON 03/31/2018] Oxycodone HCl 10 MG TABS, Take 1 tablet (10 mg total) by mouth every 6 (six) hours as needed for up to 30 days., Disp: 120 tablet, Rfl: 0  ROS  Constitutional: Denies any fever or chills Gastrointestinal: No reported hemesis, hematochezia, vomiting, or acute GI distress Musculoskeletal: Denies any acute onset joint swelling, redness, loss of ROM, or weakness Neurological: No reported episodes of acute onset apraxia,  aphasia, dysarthria, agnosia, amnesia, paralysis, loss of coordination, or loss of consciousness  Allergies  Mr. Cocozza has No Known Allergies.  PFSH  Drug: Mr. Cure  reports no history of drug use. Alcohol:  reports no history of alcohol use. Tobacco:  reports that he has never smoked. He has never used smokeless tobacco. Medical:  has a past medical history of Allergic rhinitis (12/30/2012), Bronchitis, Can't get food down (08/12/2014), Chronic back pain, Concussion (09/2015), COPD (chronic obstructive pulmonary disease) ([place]), Coronary artery disease, DDD (degenerative disc disease), cervical, Dehydration symptoms, Diabetes mellitus without complication ([place]), Dysphagia, GERD (gastroesophageal reflux disease), History of Meniere's disease (12/21/2014), History of thoracic spine surgery (S/P T9-10 IVD spacer) (12/21/2014), Hypercholesteremia, Hyperlipidemia, Hypertension, Meniere's disease, Myocardial infarction ([place]), Neuromuscular disorder ([place]), Psychosis ([place]), Short-segment Barrett's esophagus, and Sleep apnea. Surgical: Mr. Zanni  has a past surgical history that includes Cardiovascular stress test; Cardiac catheterization; Coronary angioplasty; Labrinthectomy; mastoid shunt; Appendectomy; Back surgery; Shoulder arthroscopy with subacromial decompression (Left, 04/06/2012); ARTHRODESIS ANTERIOR ANTERIOR CERVICLE SPINE (01/04/2013); Colonoscopy with propofol (N/A, 09/19/2014); Esophagogastroduodenoscopy (N/A, 09/19/2014); Savory dilation (N/A, 09/19/2014); Spinal cord stimulator implant (Right); Cardiac catheterization (N/A, 06/15/2015); and Cardiac catheterization (N/A, 06/15/2015). Family: family history includes Cancer in his sister; Diabetes in his maternal grandmother, mother, and paternal grandmother; Heart disease in his father, maternal aunt, maternal uncle, and mother.  Constitutional Exam  General appearance: cooperative and slowed mentation Vitals:   03/18/18 0846  BP: 135/72  Pulse: 83   Resp: 16  Temp: 98.4 F (36.9 C)  TempSrc: Oral  SpO2: 96%  Weight: 225 lb (102.1 kg)  Height: _0  (1.753 m)  Psych/Mental status: Alert, oriented x  3 (person, place, & time)       Eyes: PERLA Respiratory: No evidence of acute respiratory distress  Cervical Spine Area Exam  Skin & Axial Inspection: Well healed scar from previous spine surgery detected Alignment: Symmetrical Functional ROM: Unrestricted ROM      Stability: No instability detected Muscle Tone/Strength: Functionally intact. No obvious neuro-muscular anomalies detected. Sensory (Neurological): Unimpaired Palpation: No palpable anomalies              Upper Extremity (UE) Exam    Side: Right upper extremity  Side: Left upper extremity  Skin & Extremity Inspection: Skin color, temperature, and hair growth are WNL. No peripheral edema or cyanosis. No masses, redness, swelling, asymmetry, or associated skin lesions. No contractures.  Skin & Extremity Inspection: Skin color, temperature, and hair growth are WNL. No peripheral edema or cyanosis. No masses, redness, swelling, asymmetry, or associated skin lesions. No contractures.  Functional ROM: Unrestricted ROM          Functional ROM: Unrestricted ROM          Muscle Tone/Strength: Functionally intact. No obvious neuro-muscular anomalies detected.  Muscle Tone/Strength: Functionally intact. No obvious neuro-muscular anomalies detected.  Sensory (Neurological): Unimpaired          Sensory (Neurological): Unimpaired          Palpation: No palpable anomalies              Palpation: No palpable anomalies                   Thoracic Spine Area Exam  Skin & Axial Inspection: Well healed scar from previous spine surgery detected Alignment: Symmetrical Functional ROM: Unrestricted ROM Stability: No instability detected Muscle Tone/Strength: Functionally intact. No obvious neuro-muscular anomalies detected. Sensory (Neurological): Unimpaired Muscle strength & Tone: No palpable  anomalies  Lumbar Spine Area Exam  Skin & Axial Inspection: No masses, redness, or swelling Alignment: Symmetrical Functional ROM: Unrestricted ROM       Stability: No instability detected Muscle Tone/Strength: Functionally intact. No obvious neuro-muscular anomalies detected. Sensory (Neurological): Unimpaired Palpation: No palpable anomalies         Gait & Posture Assessment  Ambulation: Unassisted Gait: Relatively normal for age and body habitus Posture: WNL   Lower Extremity Exam    Side: Right lower extremity  Side: Left lower extremity  Stability: No instability observed          Stability: No instability observed          Skin & Extremity Inspection: Skin color, temperature, and hair growth are WNL. No peripheral edema or cyanosis. No masses, redness, swelling, asymmetry, or associated skin lesions. No contractures.  Skin & Extremity Inspection: Skin color, temperature, and hair growth are WNL. No peripheral edema or cyanosis. No masses, redness, swelling, asymmetry, or associated skin lesions. No contractures.  Functional ROM: Unrestricted ROM                  Functional ROM: Unrestricted ROM                  Muscle Tone/Strength: Functionally intact. No obvious neuro-muscular anomalies detected.  Muscle Tone/Strength: Functionally intact. No obvious neuro-muscular anomalies detected.  Sensory (Neurological): Unimpaired        Sensory (Neurological): Unimpaired            Palpation: No palpable anomalies  Palpation: No palpable anomalies   Assessment  Primary Diagnosis & Pertinent Problem List: The primary encounter diagnosis was Diabetic polyneuropathy associated  with diabetes mellitus due to underlying condition ([place]). Diagnoses of Cervical spinal stenosis, Lumbar spondylosis, Chronic pain syndrome, Musculoskeletal pain, Neuropathic pain, and Long term prescription opiate use were also pertinent to this visit.  Status Diagnosis  Controlled Controlled Controlled 1.  Diabetic polyneuropathy associated with diabetes mellitus due to underlying condition ([place])   2. Cervical spinal stenosis   3. Lumbar spondylosis   4. Chronic pain syndrome   5. Musculoskeletal pain   6. Neuropathic pain   7. Long term prescription opiate use     Problems updated and reviewed during this visit: Problem  Frequent Falls  Hyperkalemia   Plan of Care  Pharmacotherapy (Medications Ordered): Meds ordered this encounter  Medications  . Oxycodone HCl 10 MG TABS    Sig: Take 1 tablet (10 mg total) by mouth every 6 (six) hours as needed for up to 30 days.    Dispense:  120 tablet    Refill:  0    Do not place this medication, or any other prescription from our practice, on "Automatic Refill". Patient may have prescription filled one day early if pharmacy is closed on scheduled refill date.    Order Specific Question:   Supervising Provider    Answer:   Milinda Pointer [id number]  . Oxycodone HCl 10 MG TABS    Sig: Take 1 tablet (10 mg total) by mouth every 6 (six) hours as needed for up to 30 days.    Dispense:  120 tablet    Refill:  0    Do not place this medication, or any other prescription from our practice, on "Automatic Refill". Patient may have prescription filled one day early if pharmacy is closed on scheduled refill date.    Order Specific Question:   Supervising Provider    Answer:   Milinda Pointer [phone]  . cyclobenzaprine (FLEXERIL) 10 MG tablet    Sig: Take 1 tablet (10 mg total) by mouth every 8 (eight) hours as needed for muscle spasms.    Dispense:  270 tablet    Refill:  0    Do not add this medication to the electronic "Automatic Refill" notification system. Patient may have prescription filled one day early if pharmacy is closed on scheduled refill date.    Order Specific Question:   Supervising Provider    Answer:   Milinda Pointer [phone]  . gabapentin (NEURONTIN) 800 MG tablet    Sig: Take 2 tablets (1,600 mg total) by mouth 2 (two)  times daily.    Dispense:  120 tablet    Refill:  2    Do not place this medication, or any other prescription from our practice, on "Automatic Refill". Patient may have prescription filled one day early if pharmacy is closed on scheduled refill date. (3 mo refill)    Order Specific Question:   Supervising Provider    Answer:   Milinda Pointer [phone]  . Oxycodone HCl 10 MG TABS    Sig: Take 1 tablet (10 mg total) by mouth every 6 (six) hours as needed for up to 30 days.    Dispense:  120 tablet    Refill:  0    Do not place this medication, or any other prescription from our practice, on "Automatic Refill". Patient may have prescription filled one day early if pharmacy is closed on scheduled refill date.    Order Specific Question:   Supervising Provider    Answer:   Milinda Pointer [[id number]]   New Prescriptions   No  medications on file   Medications administered today: [name] had no medications administered during this visit. Lab-work, procedure(s), and/or referral(s): Orders Placed This Encounter  Procedures  . ToxASSURE Select 13 (MW), Urine   Imaging and/or referral(s): None  Interventional therapies: Planned, scheduled, and/or pending: None at this time.   Considering: None at this time.   Palliative PRN treatment(s): Right side posterior superior iliac spine Trigger PointInjection Diagnostic right-sided lumbar facet blockunder fluoroscopic guidance and IV sedation (PRN)    Provider-requested follow-up: Return in about 3 months (around 06/17/2018) for MedMgmt.  Future Appointments  Date Time Provider [place]  06/18/2018 11:00 AM Vevelyn Francois, NP [facility] None   Primary Care Physician: [name], MD Location: [facility] Note by: Vevelyn Francois NP Date: 03/18/2018; Time: 7:15 PM  Pain Score Disclaimer: We use the NRS-11 scale. This is a self-reported, subjective measurement of pain  severity with only modest accuracy. It is used primarily to identify changes within a particular patient. It must be understood that outpatient pain scales are significantly less accurate that those used for research, where they can be applied under ideal controlled circumstances with minimal exposure to variables. In reality, the score is likely to be a combination of pain intensity and pain affect, where pain affect describes the degree of emotional arousal or changes in action readiness caused by the sensory experience of pain. Factors such as social and work situation, setting, emotional state, anxiety levels, expectation, and prior pain experience may influence pain perception and show large inter-individual differences that may also be affected by time variables.  Patient instructions provided during this appointment: Patient Instructions   ____________________________________________________________________________________________  Medication Rules  Purpose: To inform patients, and their family members, of our rules and regulations.  Applies to: All patients receiving prescriptions (written or electronic).  Pharmacy of record: Pharmacy where electronic prescriptions will be sent. If written prescriptions are taken to a different pharmacy, please inform the nursing staff. The pharmacy listed in the electronic medical record should be the one where you would like electronic prescriptions to be sent.  Electronic prescriptions: In compliance with the [place] (STOP) Act of 2017 (Session Lanny Cramp [phone]), effective February 18, 2018, all controlled substances must be electronically prescribed. Calling prescriptions to the pharmacy will cease to exist.  Prescription refills: Only during scheduled appointments. Applies to all prescriptions.  NOTE: The following applies primarily to controlled substances (Opioid* Pain Medications).   Patient's  responsibilities: 1. Pain Pills: Bring all pain pills to every appointment (except for procedure appointments). 2. Pill Bottles: Bring pills in original pharmacy bottle. Always bring the newest bottle. Bring bottle, even if empty. 3. Medication refills: You are responsible for knowing and keeping track of what medications you take and those you need refilled. The day before your appointment: write a list of all prescriptions that need to be refilled. The day of the appointment: give the list to the admitting nurse. Prescriptions will be written only during appointments. If you forget a medication: it will not be "Called in", "Faxed", or "electronically sent". You will need to get another appointment to get these prescribed. No early refills. Do not call asking to have your prescription filled early. 4. Prescription Accuracy: You are responsible for carefully inspecting your prescriptions before leaving our office. Have the discharge nurse carefully go over each prescription with you, before taking them home. Make sure that your name is accurately spelled, that your address is  correct. Check the name and dose of your medication to make sure it is accurate. Check the number of pills, and the written instructions to make sure they are clear and accurate. Make sure that you are given enough medication to last until your next medication refill appointment. 5. Taking Medication: Take medication as prescribed. When it comes to controlled substances, taking less pills or less frequently than prescribed is permitted and encouraged. Never take more pills than instructed. Never take medication more frequently than prescribed.  6. Inform other Doctors: Always inform, all of your healthcare providers, of all the medications you take. 7. Pain Medication from other Providers: You are not allowed to accept any additional pain medication from any other Doctor or Healthcare provider. There are two exceptions to this  rule. (see below) In the event that you require additional pain medication, you are responsible for notifying us, as stated below. 8. Medication Agreement: You are responsible for carefully reading and following our Medication Agreement. This must be signed before receiving any prescriptions from our practice. Safely store a copy of your signed Agreement. Violations to the Agreement will result in no further prescriptions. (Additional copies of our Medication Agreement are available upon request.) 9. Laws, Rules, & Regulations: All patients are expected to follow all Federal and [employer], [employer], Rules, [employer]. Ignorance of the Laws does not constitute a valid excuse. The use of any illegal substances is prohibited. 10. Adopted CDC guidelines & recommendations: Target dosing levels will be at or below 60 MME/day. Use of benzodiazepines** is not recommended.  Exceptions: There are only two exceptions to the rule of not receiving pain medications from other Healthcare Providers. 1. Exception #1 (Emergencies): In the event of an emergency (i.e.: accident requiring emergency care), you are allowed to receive additional pain medication. However, you are responsible for: As soon as you are able, call our office [phone], at any time of the day or night, and leave a message stating your name, the date and nature of the emergency, and the name and dose of the medication prescribed. In the event that your call is answered by a member of our staff, make sure to document and save the date, time, and the name of the person that took your information.  2. Exception #2 (Planned Surgery): In the event that you are scheduled by another doctor or dentist to have any type of surgery or procedure, you are allowed (for a period no longer than 30 days), to receive additional pain medication, for the acute post-op pain. However, in this case, you are responsible for picking up a copy of our "Post-op Pain  Management for Surgeons" handout, and giving it to your surgeon or dentist. This document is available at our office, and does not require an appointment to obtain it. Simply go to our office during business hours (Monday-Thursday from 8:00 AM to 4:00 PM) (Friday 8:00 AM to 12:00 Noon) or if you have a scheduled appointment with [name], prior to your surgery, and ask for it by name. In addition, you will need to provide [name] with your name, name of your surgeon, type of surgery, and date of procedure or surgery.  *Opioid medications include: morphine, codeine, oxycodone, oxymorphone, hydrocodone, hydromorphone, meperidine, tramadol, tapentadol, buprenorphine, fentanyl, methadone. **Benzodiazepine medications include: diazepam (Valium), alprazolam (Xanax), clonazepam (Klonopine), lorazepam (Ativan), clorazepate (Tranxene), chlordiazepoxide (Librium), estazolam (Prosom), oxazepam (Serax), temazepam (Restoril), triazolam (Halcion) (Last updated: 04/17/2017) ____________________________________________________________________________________________    BMI Assessment: Estimated body mass index is 33.23  kg/m as calculated from the following:   Height as of this encounter: _0  (1.753 m).   Weight as of this encounter: 225 lb (102.1 kg).  BMI interpretation table: BMI level Category Range association with higher incidence of chronic pain  <18 kg/m2 Underweight   18.5-24.9 kg/m2 Ideal body weight   25-29.9 kg/m2 Overweight Increased incidence by 20%  30-34.9 kg/m2 Obese (Class I) Increased incidence by 68%  35-39.9 kg/m2 Severe obesity (Class II) Increased incidence by 136%  >40 kg/m2 Extreme obesity (Class III) Increased incidence by 254%   Patient's current BMI Ideal Body weight  Body mass index is 33.23 kg/m. Ideal body weight: 70.7 kg (155 lb 13.8 oz) Adjusted ideal body weight: 83.2 kg (183 lb 8.3 oz)   BMI Readings from Last 4 Encounters:  03/18/18 33.23 kg/m  02/20/18 31.44 kg/m  01/18/18  33.23 kg/m  12/03/17 32.49 kg/m   Wt Readings from Last 4 Encounters:  03/18/18 225 lb (102.1 kg)  02/20/18 212 lb 14.4 oz (96.6 kg)  01/18/18 225 lb (102.1 kg)  12/03/17 220 lb (99.8 kg)   ____________________________________________________________________________________________  CANNABIDIOL (AKA: CBD Oil or Pills)  Applies to: All patients receiving prescriptions of controlled substances (written and/or electronic).  General Information: Cannabidiol (CBD) was discovered in 24. It is one of some 113 identified cannabinoids in cannabis (Marijuana) plants, accounting for up to 40% of the plant's extract. As of 2018, preliminary clinical research on cannabidiol included studies of anxiety, cognition, movement disorders, and pain.  Cannabidiol is consummed in multiple ways, including inhalation of cannabis smoke or vapor, as an aerosol spray into the cheek, and by mouth. It may be supplied as CBD oil containing CBD as the active ingredient (no added tetrahydrocannabinol (THC) or terpenes), a full-plant CBD-dominant hemp extract oil, capsules, dried cannabis, or as a liquid solution. CBD is thought not have the same psychoactivity as THC, and may affect the actions of THC. Studies suggest that CBD may interact with different biological targets, including cannabinoid receptors and other neurotransmitter receptors. As of 2018 the mechanism of action for its biological effects has not been determined.  In the [place], cannabidiol has a limited approval by the Food and Drug Administration (FDA) for treatment of only two types of epilepsy disorders. The side effects of long-term use of the drug include somnolence, decreased appetite, diarrhea, fatigue, malaise, weakness, sleeping problems, and others.  CBD remains a Schedule I drug prohibited for any use.  Legality: Some manufacturers ship CBD products nationally, an illegal action which the FDA has not enforced in 2018, with CBD remaining  the subject of an FDA investigational new drug evaluation, and is not considered legal as a dietary supplement or food ingredient as of December 2018. Federal illegality has made it difficult historically to conduct research on CBD. CBD is openly sold in head shops and health food stores in some states where such sales have not been explicitly legalized.  Warning: Because it is not FDA approved for general use or treatment of pain, it is not required to undergo the same manufacturing controls as prescription drugs.  This means that the available cannabidiol (CBD) may be contaminated with THC.  If this is the case, it will trigger a positive urine drug screen (UDS) test for cannabinoids (Marijuana).  Because a positive UDS for illicit substances is a violation of our medication agreement, your opioid analgesics (pain medicine) may be permanently discontinued. (Last update: 05/08/2017) ____________________________________________________________________________________________  Oxycodone hcl 10 mg x 3 months  escribed to your pharmacy fill dates:  03/31/18, 04/30/18, 05/30/18   Gabapentin and Flexeril escribed to [facility] mail order.

## 2018-03-18 NOTE — Progress Notes (Signed)
Nursing Pain Medication Assessment:  Safety precautions to be maintained throughout the outpatient stay will include: orient to surroundings, keep bed in low position, maintain call bell within reach at all times, provide assistance with transfer out of bed and ambulation.  Medication Inspection Compliance: Pill count conducted under aseptic conditions, in front of the patient. Neither the pills nor the bottle was removed from the patient's sight at any time. Once count was completed pills were immediately returned to the patient in their original bottle.  Medication: Oxycodone IR Pill/Patch Count: 88 of 120 pills remain Pill/Patch Appearance: Markings consistent with prescribed medication Bottle Appearance: Standard pharmacy container. Clearly labeled. Filled Date: 01 / 18 / 2020 Last Medication intake:  Today

## 2018-03-18 NOTE — Patient Instructions (Addendum)
____________________________________________________________________________________________  Medication Rules  Purpose: To inform patients, and their family members, of our rules and regulations.  Applies to: All patients receiving prescriptions (written or electronic).  Pharmacy of record: Pharmacy where electronic prescriptions will be sent. If written prescriptions are taken to a different pharmacy, please inform the nursing staff. The pharmacy listed in the electronic medical record should be the one where you would like electronic prescriptions to be sent.  Electronic prescriptions: In compliance with the [place] (STOP) Act of 2017 (Session Lanny Cramp [phone]), effective February 18, 2018, all controlled substances must be electronically prescribed. Calling prescriptions to the pharmacy will cease to exist.  Prescription refills: Only during scheduled appointments. Applies to all prescriptions.  NOTE: The following applies primarily to controlled substances (Opioid* Pain Medications).   Patient's responsibilities: 1. Pain Pills: Bring all pain pills to every appointment (except for procedure appointments). 2. Pill Bottles: Bring pills in original pharmacy bottle. Always bring the newest bottle. Bring bottle, even if empty. 3. Medication refills: You are responsible for knowing and keeping track of what medications you take and those you need refilled. The day before your appointment: write a list of all prescriptions that need to be refilled. The day of the appointment: give the list to the admitting nurse. Prescriptions will be written only during appointments. If you forget a medication: it will not be "Called in", "Faxed", or "electronically sent". You will need to get another appointment to get these prescribed. No early refills. Do not call asking to have your prescription filled early. 4. Prescription Accuracy: You are responsible for  carefully inspecting your prescriptions before leaving our office. Have the discharge nurse carefully go over each prescription with you, before taking them home. Make sure that your name is accurately spelled, that your address is correct. Check the name and dose of your medication to make sure it is accurate. Check the number of pills, and the written instructions to make sure they are clear and accurate. Make sure that you are given enough medication to last until your next medication refill appointment. 5. Taking Medication: Take medication as prescribed. When it comes to controlled substances, taking less pills or less frequently than prescribed is permitted and encouraged. Never take more pills than instructed. Never take medication more frequently than prescribed.  6. Inform other Doctors: Always inform, all of your healthcare providers, of all the medications you take. 7. Pain Medication from other Providers: You are not allowed to accept any additional pain medication from any other Doctor or Healthcare provider. There are two exceptions to this rule. (see below) In the event that you require additional pain medication, you are responsible for notifying us, as stated below. 8. Medication Agreement: You are responsible for carefully reading and following our Medication Agreement. This must be signed before receiving any prescriptions from our practice. Safely store a copy of your signed Agreement. Violations to the Agreement will result in no further prescriptions. (Additional copies of our Medication Agreement are available upon request.) 9. Laws, Rules, & Regulations: All patients are expected to follow all Federal and [employer], [employer], Rules, [employer]. Ignorance of the Laws does not constitute a valid excuse. The use of any illegal substances is prohibited. 10. Adopted CDC guidelines & recommendations: Target dosing levels will be at or below 60 MME/day. Use of benzodiazepines** is not  recommended.  Exceptions: There are only two exceptions to the rule of not receiving pain medications from other Healthcare Providers. 1.  Exception #1 (Emergencies): In the event of an emergency (i.e.: accident requiring emergency care), you are allowed to receive additional pain medication. However, you are responsible for: As soon as you are able, call our office [phone], at any time of the day or night, and leave a message stating your name, the date and nature of the emergency, and the name and dose of the medication prescribed. In the event that your call is answered by a member of our staff, make sure to document and save the date, time, and the name of the person that took your information.  2. Exception #2 (Planned Surgery): In the event that you are scheduled by another doctor or dentist to have any type of surgery or procedure, you are allowed (for a period no longer than 30 days), to receive additional pain medication, for the acute post-op pain. However, in this case, you are responsible for picking up a copy of our "Post-op Pain Management for Surgeons" handout, and giving it to your surgeon or dentist. This document is available at our office, and does not require an appointment to obtain it. Simply go to our office during business hours (Monday-Thursday from 8:00 AM to 4:00 PM) (Friday 8:00 AM to 12:00 Noon) or if you have a scheduled appointment with [name], prior to your surgery, and ask for it by name. In addition, you will need to provide [name] with your name, name of your surgeon, type of surgery, and date of procedure or surgery.  *Opioid medications include: morphine, codeine, oxycodone, oxymorphone, hydrocodone, hydromorphone, meperidine, tramadol, tapentadol, buprenorphine, fentanyl, methadone. **Benzodiazepine medications include: diazepam (Valium), alprazolam (Xanax), clonazepam (Klonopine), lorazepam (Ativan), clorazepate (Tranxene), chlordiazepoxide (Librium), estazolam (Prosom),  oxazepam (Serax), temazepam (Restoril), triazolam (Halcion) (Last updated: 04/17/2017) ____________________________________________________________________________________________    BMI Assessment: Estimated body mass index is 33.23 kg/m as calculated from the following:   Height as of this encounter: 5' 9"  (1.753 m).   Weight as of this encounter: 225 lb (102.1 kg).  BMI interpretation table: BMI level Category Range association with higher incidence of chronic pain  <18 kg/m2 Underweight   18.5-24.9 kg/m2 Ideal body weight   25-29.9 kg/m2 Overweight Increased incidence by 20%  30-34.9 kg/m2 Obese (Class I) Increased incidence by 68%  35-39.9 kg/m2 Severe obesity (Class II) Increased incidence by 136%  >40 kg/m2 Extreme obesity (Class III) Increased incidence by 254%   Patient's current BMI Ideal Body weight  Body mass index is 33.23 kg/m. Ideal body weight: 70.7 kg (155 lb 13.8 oz) Adjusted ideal body weight: 83.2 kg (183 lb 8.3 oz)   BMI Readings from Last 4 Encounters:  03/18/18 33.23 kg/m  02/20/18 31.44 kg/m  01/18/18 33.23 kg/m  12/03/17 32.49 kg/m   Wt Readings from Last 4 Encounters:  03/18/18 225 lb (102.1 kg)  02/20/18 212 lb 14.4 oz (96.6 kg)  01/18/18 225 lb (102.1 kg)  12/03/17 220 lb (99.8 kg)   ____________________________________________________________________________________________  CANNABIDIOL (AKA: CBD Oil or Pills)  Applies to: All patients receiving prescriptions of controlled substances (written and/or electronic).  General Information: Cannabidiol (CBD) was discovered in 67. It is one of some 113 identified cannabinoids in cannabis (Marijuana) plants, accounting for up to 40% of the plant's extract. As of 2018, preliminary clinical research on cannabidiol included studies of anxiety, cognition, movement disorders, and pain.  Cannabidiol is consummed in multiple ways, including inhalation of cannabis smoke or vapor, as an aerosol spray into  the cheek, and by mouth. It may be supplied as CBD  oil containing CBD as the active ingredient (no added tetrahydrocannabinol (THC) or terpenes), a full-plant CBD-dominant hemp extract oil, capsules, dried cannabis, or as a liquid solution. CBD is thought not have the same psychoactivity as THC, and may affect the actions of THC. Studies suggest that CBD may interact with different biological targets, including cannabinoid receptors and other neurotransmitter receptors. As of 2018 the mechanism of action for its biological effects has not been determined.  In the [place], cannabidiol has a limited approval by the Food and Drug Administration (FDA) for treatment of only two types of epilepsy disorders. The side effects of long-term use of the drug include somnolence, decreased appetite, diarrhea, fatigue, malaise, weakness, sleeping problems, and others.  CBD remains a Schedule I drug prohibited for any use.  Legality: Some manufacturers ship CBD products nationally, an illegal action which the FDA has not enforced in 2018, with CBD remaining the subject of an FDA investigational new drug evaluation, and is not considered legal as a dietary supplement or food ingredient as of December 2018. Federal illegality has made it difficult historically to conduct research on CBD. CBD is openly sold in head shops and health food stores in some states where such sales have not been explicitly legalized.  Warning: Because it is not FDA approved for general use or treatment of pain, it is not required to undergo the same manufacturing controls as prescription drugs.  This means that the available cannabidiol (CBD) may be contaminated with THC.  If this is the case, it will trigger a positive urine drug screen (UDS) test for cannabinoids (Marijuana).  Because a positive UDS for illicit substances is a violation of our medication agreement, your opioid analgesics (pain medicine) may be permanently discontinued. (Last  update: 05/08/2017) ____________________________________________________________________________________________  Oxycodone hcl 10 mg x 3 months escribed to your pharmacy fill dates:  03/31/18, 04/30/18, 05/30/18   Gabapentin and Flexeril escribed to [facility] mail order.

## 2018-03-20 ENCOUNTER — Other Ambulatory Visit: Payer: Self-pay | Admitting: Nurse Practitioner

## 2018-03-20 DIAGNOSIS — M792 Neuralgia and neuritis, unspecified: Secondary | ICD-10-CM

## 2018-03-22 LAB — TOXASSURE SELECT 13 (MW), URINE

## 2018-06-18 ENCOUNTER — Ambulatory Visit: Payer: Medicare Other | Attending: Nurse Practitioner | Admitting: Nurse Practitioner

## 2018-06-18 ENCOUNTER — Encounter: Payer: Self-pay | Admitting: Nurse Practitioner

## 2018-06-18 ENCOUNTER — Telehealth: Payer: Self-pay | Admitting: Nurse Practitioner

## 2018-06-18 ENCOUNTER — Other Ambulatory Visit: Payer: Self-pay

## 2018-06-18 DIAGNOSIS — E0842 Diabetes mellitus due to underlying condition with diabetic polyneuropathy: Secondary | ICD-10-CM

## 2018-06-18 DIAGNOSIS — M7918 Myalgia, other site: Secondary | ICD-10-CM | POA: Diagnosis not present

## 2018-06-18 DIAGNOSIS — M792 Neuralgia and neuritis, unspecified: Secondary | ICD-10-CM

## 2018-06-18 DIAGNOSIS — M47816 Spondylosis without myelopathy or radiculopathy, lumbar region: Secondary | ICD-10-CM

## 2018-06-18 DIAGNOSIS — G894 Chronic pain syndrome: Secondary | ICD-10-CM

## 2018-06-18 DIAGNOSIS — M4802 Spinal stenosis, cervical region: Secondary | ICD-10-CM

## 2018-06-18 MED ORDER — CYCLOBENZAPRINE HCL 10 MG PO TABS: 10.0000 mg | ORAL_TABLET | Freq: Three times a day (TID) | ORAL | 0 refills | Status: DC | PRN

## 2018-06-18 MED ORDER — OXYCODONE HCL 10 MG PO TABS: 10.0000 mg | ORAL_TABLET | Freq: Four times a day (QID) | ORAL | 0 refills | Status: DC | PRN

## 2018-06-18 MED ORDER — GABAPENTIN 800 MG PO TABS: 1600.0000 mg | ORAL_TABLET | Freq: Two times a day (BID) | ORAL | 2 refills | Status: DC

## 2018-06-18 NOTE — Telephone Encounter (Signed)
Yes

## 2018-06-18 NOTE — Patient Instructions (Addendum)
____________________________________________________________________________________________  Medication Rules  Purpose: To inform patients, and their family members, of our rules and regulations.  Applies to: All patients receiving prescriptions (written or electronic).  Pharmacy of record: Pharmacy where electronic prescriptions will be sent. If written prescriptions are taken to a different pharmacy, please inform the nursing staff. The pharmacy listed in the electronic medical record should be the one where you would like electronic prescriptions to be sent.  Electronic prescriptions: In compliance with the [place] Strengthen Opioid Misuse Prevention (STOP) Act of 2017 (Session Law 2017-74/H243), effective February 18, 2018, all controlled substances must be electronically prescribed. Calling prescriptions to the pharmacy will cease to exist.  Prescription refills: Only during scheduled appointments. Applies to all prescriptions.  NOTE: The following applies primarily to controlled substances (Opioid* Pain Medications).   Patient's responsibilities: 1. Pain Pills: Bring all pain pills to every appointment (except for procedure appointments). 2. Pill Bottles: Bring pills in original pharmacy bottle. Always bring the newest bottle. Bring bottle, even if empty. 3. Medication refills: You are responsible for knowing and keeping track of what medications you take and those you need refilled. The day before your appointment: write a list of all prescriptions that need to be refilled. The day of the appointment: give the list to the admitting nurse. Prescriptions will be written only during appointments. No prescriptions will be written on procedure days. If you forget a medication: it will not be "Called in", "Faxed", or "electronically sent". You will need to get another appointment to get these prescribed. No early refills. Do not call asking to have your prescription filled  early. 4. Prescription Accuracy: You are responsible for carefully inspecting your prescriptions before leaving our office. Have the discharge nurse carefully go over each prescription with you, before taking them home. Make sure that your name is accurately spelled, that your address is correct. Check the name and dose of your medication to make sure it is accurate. Check the number of pills, and the written instructions to make sure they are clear and accurate. Make sure that you are given enough medication to last until your next medication refill appointment. 5. Taking Medication: Take medication as prescribed. When it comes to controlled substances, taking less pills or less frequently than prescribed is permitted and encouraged. Never take more pills than instructed. Never take medication more frequently than prescribed.  6. Inform other Doctors: Always inform, all of your healthcare providers, of all the medications you take. 7. Pain Medication from other Providers: You are not allowed to accept any additional pain medication from any other Doctor or Healthcare provider. There are two exceptions to this rule. (see below) In the event that you require additional pain medication, you are responsible for notifying us, as stated below. 8. Medication Agreement: You are responsible for carefully reading and following our Medication Agreement. This must be signed before receiving any prescriptions from our practice. Safely store a copy of your signed Agreement. Violations to the Agreement will result in no further prescriptions. (Additional copies of our Medication Agreement are available upon request.) 9. Laws, Rules, & Regulations: All patients are expected to follow all Federal and State Laws, Statutes, Rules, & Regulations. Ignorance of the Laws does not constitute a valid excuse. The use of any illegal substances is prohibited. 10. Adopted CDC guidelines & recommendations: Target dosing levels will be  at or below 60 MME/day. Use of benzodiazepines** is not recommended.  Exceptions: There are only two exceptions to the rule of not   receiving pain medications from other Healthcare Providers. 1. Exception #1 (Emergencies): In the event of an emergency (i.e.: accident requiring emergency care), you are allowed to receive additional pain medication. However, you are responsible for: As soon as you are able, call our office [phone], at any time of the day or night, and leave a message stating your name, the date and nature of the emergency, and the name and dose of the medication prescribed. In the event that your call is answered by a member of our staff, make sure to document and save the date, time, and the name of the person that took your information.  2. Exception #2 (Planned Surgery): In the event that you are scheduled by another doctor or dentist to have any type of surgery or procedure, you are allowed (for a period no longer than 30 days), to receive additional pain medication, for the acute post-op pain. However, in this case, you are responsible for picking up a copy of our "Post-op Pain Management for Surgeons" handout, and giving it to your surgeon or dentist. This document is available at our office, and does not require an appointment to obtain it. Simply go to our office during business hours (Monday-Thursday from 8:00 AM to 4:00 PM) (Friday 8:00 AM to 12:00 Noon) or if you have a scheduled appointment with us, prior to your surgery, and ask for it by name. In addition, you will need to provide us with your name, name of your surgeon, type of surgery, and date of procedure or surgery.  *Opioid medications include: morphine, codeine, oxycodone, oxymorphone, hydrocodone, hydromorphone, meperidine, tramadol, tapentadol, buprenorphine, fentanyl, methadone. **Benzodiazepine medications include: diazepam (Valium), alprazolam (Xanax), clonazepam (Klonopine), lorazepam (Ativan), clorazepate  (Tranxene), chlordiazepoxide (Librium), estazolam (Prosom), oxazepam (Serax), temazepam (Restoril), triazolam (Halcion) (Last updated: 04/17/2017) ____________________________________________________________________________________________    

## 2018-06-18 NOTE — Telephone Encounter (Signed)
Patient notified of refill.

## 2018-06-18 NOTE — Progress Notes (Signed)
Pain Management Encounter Note - Virtual Visit via Telephone Telehealth (real-time audio visits between healthcare provider and patient).  Patient's Phone No. & Preferred Pharmacy:  [phone] (home); [phone] (mobile); (Preferred) [place], [place] - [address] [place] [address] Phone: [phone] Fax: [phone]  [place], [facility] [address] [place] [address] [place] [address] Phone: [phone] Fax: [phone]   Pre-screening note:  Our staff contacted Kenneth Osborn and offered him an "in person", "face-to-face" appointment versus a telephone encounter. He indicated preferring the telephone encounter, at this time.  Reason for Virtual Visit: COVID-19*  Social distancing based on CDC and AMA recommendations.   I contacted Kenneth Osborn on 06/18/2018 at 11:00 AM by telephone and clearly identified myself as Dionisio David, NP. I verified that I was speaking with the correct person using two identifiers (Name and date of birth: [birth year]).  Advanced Informed Consent I sought verbal advanced consent from Kenneth Osborn for telemedicine interactions and virtual visit. I informed Kenneth Osborn of the security and privacy concerns, risks, and limitations associated with performing an evaluation and management service by telephone. I also informed Kenneth Osborn of the availability of "in person" appointments and I informed him of the possibility of a patient responsible charge related to this service. Kenneth Osborn expressed understanding and agreed to proceed.   Historic Elements   Kenneth Osborn is a 63 y.o. year old, male patient evaluated today after his last encounter by our practice on 03/20/2018. Kenneth Osborn  has a past medical history of Allergic rhinitis (12/30/2012), Bronchitis, Can't get food down (08/12/2014), Chronic back pain, Concussion (09/2015), COPD (chronic  obstructive pulmonary disease) ([place]), Coronary artery disease, DDD (degenerative disc disease), cervical, Dehydration symptoms, Diabetes mellitus without complication ([place]), Dysphagia, GERD (gastroesophageal reflux disease), History of Meniere's disease (12/21/2014), History of thoracic spine surgery (S/P T9-10 IVD spacer) (12/21/2014), Hypercholesteremia, Hyperlipidemia, Hypertension, Meniere's disease, Myocardial infarction ([place]), Neuromuscular disorder ([place]), Psychosis ([place]), Short-segment Barrett's esophagus, and Sleep apnea. He also  has a past surgical history that includes Cardiovascular stress test; Cardiac catheterization; Coronary angioplasty; Labrinthectomy; mastoid shunt; Appendectomy; Back surgery; Shoulder arthroscopy with subacromial decompression (Left, 04/06/2012); ARTHRODESIS ANTERIOR ANTERIOR CERVICLE SPINE (01/04/2013); Colonoscopy with propofol (N/A, 09/19/2014); Esophagogastroduodenoscopy (N/A, 09/19/2014); Savory dilation (N/A, 09/19/2014); Spinal cord stimulator implant (Right); Cardiac catheterization (N/A, 06/15/2015); and Cardiac catheterization (N/A, 06/15/2015). Kenneth Osborn has a current medication list which includes the following prescription(s): alprazolam, aspirin ec, cetirizine, cyclobenzaprine, diazepam, diltiazem, esomeprazole, ezetimibe, gabapentin, humulin r u-500 kwikpen, isosorbide mononitrate, magnesium oxide, meclizine, metformin, metoprolol succinate, montelukast, naloxone, niacin, nitroglycerin, fish oil, oxycodone hcl, oxycodone hcl, oxycodone hcl, prasugrel, and rosuvastatin. He  reports that he has never smoked. He has never used smokeless tobacco. He reports that he does not drink alcohol or use drugs. Kenneth Osborn has No Known Allergies.   HPI  I last saw him on 03/20/2018. He is being evaluated for medication management. He rates his pain 3/10. He continues to have middle and low back pain. He admits that he is going to allow his SCS battery to die and have the SCS removed. He  feels like this makes his back spasms so much worse.   Pharmacotherapy Assessment  Analgesic:Oxycodone IR 10 mg every 6 hours (40 mg/day) MME/day:60 mg/day  Monitoring: Pharmacotherapy: No side-effects or adverse reactions reported. [place] PMP: PDMP not reviewed this encounter.  Compliance: No problems identified. Plan: Refer to "POC".  Review of recent tests  CT Head Wo Contrast CLINICAL DATA:  Multiple falls.  EXAM: CT HEAD WITHOUT CONTRAST  TECHNIQUE: Contiguous axial images were obtained from the base of the skull through the vertex without intravenous contrast.  COMPARISON:  09/03/2017  FINDINGS: Brain: No evidence of acute infarction, hemorrhage, hydrocephalus, extra-axial collection or mass lesion/mass effect. Moderate brain parenchymal volume loss.  Vascular: No hyperdense vessel or unexpected calcification.  Skull: Normal. Negative for fracture or focal lesion.  Sinuses/Orbits: No acute finding.  Other: None.  IMPRESSION: No acute intracranial abnormality.  Moderate brain parenchymal volume loss.  Electronically Signed   By: Fidela Salisbury M.D.   On: 01/18/2018 13:38   Office Visit on 03/18/2018  Component Date Value Ref Range Status  . Summary 03/18/2018 FINAL   Final   Comment: ==================================================================== TOXASSURE SELECT 13 (MW) ==================================================================== Test                             Result       Flag       Units Drug Present and Declared for Prescription Verification   Desmethyldiazepam              123          EXPECTED   ng/mg creat   Oxazepam                       >897         EXPECTED   ng/mg creat   Temazepam                      291          EXPECTED   ng/mg creat    Desmethyldiazepam, oxazepam, and temazepam are expected    metabolites of diazepam. Desmethyldiazepam and oxazepam are also    expected metabolites of other drugs, including  chlordiazepoxide,    prazepam, clorazepate, and halazepam. Oxazepam is an expected    metabolite of temazepam. Oxazepam and temazepam are also    available as scheduled prescription medications.   Oxycodone                      1956         EXPECTED   ng/mg creat   Oxymorphone                    4052         EXPECTED                             ng/mg creat   Noroxycodone                   957          EXPECTED   ng/mg creat   Noroxymorphone                 485          EXPECTED   ng/mg creat    Sources of oxycodone are scheduled prescription medications.    Oxymorphone, noroxycodone, and noroxymorphone are expected    metabolites of oxycodone. Oxymorphone is also available as a    scheduled prescription medication. Drug Absent but Declared for Prescription Verification   Alprazolam  Not Detected UNEXPECTED ng/mg creat ==================================================================== Test                      Result    Flag   Units      Ref Range   Creatinine              223              mg/dL      >=20 ==================================================================== Declared Medications:  The flagging and interpretation on this report are based on the  following declared medications.  Unexpected results may arise from  inaccuracies in the declared medications.  **Note: The testing                           scope of this panel includes these medications:  Alprazolam (Xanax)  Diazepam (Valium)  Oxycodone  **Note: The testing scope of this panel does not include following  reported medications:  Aspirin  Cetirizine (Zyrtec)  Cyclobenzaprine (Flexeril)  Diltiazem (Cardizem)  Ezetimibe (Zetia)  Gabapentin (Neurontin)  Insulin (Humulin)  Isosorbide (Imdur)  Magnesium  Meclizine (Antivert)  Metformin (Glucophage)  Metoprolol (Toprol)  Montelukast (Singulair)  Naloxone (Narcan)  Niacin (Niaspan)  Nitroglycerin (Nitrostat)  Omega-3 Fatty Acids (Fish  Oil)  Omeprazole (Nexium)  Prasugrel (Effient)  Rosuvastatin (Crestor) ==================================================================== For clinical consultation, please call [phone]. ====================================================================    Assessment  The primary encounter diagnosis was Diabetic polyneuropathy associated with diabetes mellitus due to underlying condition ([place]). Diagnoses of Musculoskeletal pain, Neuropathic pain, Chronic pain syndrome, Cervical spinal stenosis, and Lumbar spondylosis were also pertinent to this visit.  Plan of Care  I am having Kenneth Osborn maintain his diazepam, cetirizine, metoprolol succinate, prasugrel, esomeprazole, niacin, aspirin EC, ALPRAZolam, meclizine, diltiazem, Fish Oil, rosuvastatin, montelukast, ezetimibe, naloxone, HumuLIN R U-500 KwikPen, isosorbide mononitrate, Magnesium Oxide, nitroGLYCERIN, metFORMIN, cyclobenzaprine, gabapentin, Oxycodone HCl, Oxycodone HCl, and Oxycodone HCl.  Pharmacotherapy (Medications Ordered): Meds ordered this encounter  Medications  . cyclobenzaprine (FLEXERIL) 10 MG tablet    Sig: Take 1 tablet (10 mg total) by mouth every 8 (eight) hours as needed for muscle spasms.    Dispense:  270 tablet    Refill:  0    Do not add this medication to the electronic "Automatic Refill" notification system. Patient may have prescription filled one day early if pharmacy is closed on scheduled refill date.    Order Specific Question:   Supervising Provider    Answer:   Milinda Pointer [phone]  . gabapentin (NEURONTIN) 800 MG tablet    Sig: Take 2 tablets (1,600 mg total) by mouth 2 (two) times daily.    Dispense:  120 tablet    Refill:  2    Do not place this medication, or any other prescription from our practice, on "Automatic Refill". Patient may have prescription filled one day early if pharmacy is closed on scheduled refill date. (3 mo refill)    Order Specific Question:   Supervising  Provider    Answer:   Milinda Pointer [phone]  . Oxycodone HCl 10 MG TABS    Sig: Take 1 tablet (10 mg total) by mouth every 6 (six) hours as needed for up to 30 days.    Dispense:  120 tablet    Refill:  0    Do not place this medication, or any other prescription from our practice, on "Automatic Refill". Patient may have prescription filled one day early if pharmacy is  closed on scheduled refill date.    Order Specific Question:   Supervising Provider    Answer:   Milinda Pointer [phone]  . Oxycodone HCl 10 MG TABS    Sig: Take 1 tablet (10 mg total) by mouth every 6 (six) hours as needed for up to 30 days.    Dispense:  120 tablet    Refill:  0    Do not place this medication, or any other prescription from our practice, on "Automatic Refill". Patient may have prescription filled one day early if pharmacy is closed on scheduled refill date.    Order Specific Question:   Supervising Provider    Answer:   Milinda Pointer [phone]  . Oxycodone HCl 10 MG TABS    Sig: Take 1 tablet (10 mg total) by mouth every 6 (six) hours as needed for up to 30 days.    Dispense:  120 tablet    Refill:  0    Do not place this medication, or any other prescription from our practice, on "Automatic Refill". Patient may have prescription filled one day early if pharmacy is closed on scheduled refill date.    Order Specific Question:   Supervising Provider    Answer:   Milinda Pointer [phone]   Orders:  No orders of the defined types were placed in this encounter.  Follow-up plan:   Return in about 3 months (around 09/17/2018) for MedMgmt.   I discussed the assessment and treatment plan with the patient. The patient was provided an opportunity to ask questions and all were answered. The patient agreed with the plan and demonstrated an understanding of the instructions.  Patient advised to call back or seek an in-person evaluation if the symptoms or condition worsens.  Total duration of  non-face-to-face encounter: 13 minutes.  Note by: Dionisio David, NP Date: 06/18/2018; Time: 3:55 PM  Disclaimer:  * Given the special circumstances of the COVID-19 pandemic, the federal government has announced that the Office for Civil Rights (OCR) will exercise its enforcement discretion and will not impose penalties on physicians using telehealth in the event of noncompliance with regulatory requirements under the [place] and [place] (HIPAA) in connection with the good faith provision of telehealth during the KXFGH-82 national public health emergency. ([place])

## 2018-06-18 NOTE — Telephone Encounter (Signed)
Pt states that he forgot to tell Crystal during his phone visit that he also needs a refill on his muscle relaxer as well.

## 2018-08-19 ENCOUNTER — Other Ambulatory Visit
Admission: RE | Admit: 2018-08-19 | Discharge: 2018-08-19 | Disposition: A | Discharge status: Home / Self Care | Payer: Medicare Other | Source: Ambulatory Visit | Attending: Pain Medicine | Admitting: Pain Medicine

## 2018-08-19 ENCOUNTER — Other Ambulatory Visit: Payer: Self-pay

## 2018-08-19 DIAGNOSIS — Z1159 Encounter for screening for other viral diseases: Secondary | ICD-10-CM | POA: Insufficient documentation

## 2018-08-19 DIAGNOSIS — Z01812 Encounter for preprocedural laboratory examination: Secondary | ICD-10-CM | POA: Diagnosis present

## 2018-08-19 LAB — SARS CORONAVIRUS 2 (TAT 6-24 HRS): SARS Coronavirus 2: NEGATIVE

## 2018-08-21 ENCOUNTER — Other Ambulatory Visit
Admission: RE | Admit: 2018-08-21 | Discharge: 2018-08-21 | Disposition: A | Discharge status: Home / Self Care | Payer: Medicare Other | Source: Ambulatory Visit | Attending: Pain Medicine | Admitting: Pain Medicine

## 2018-08-25 ENCOUNTER — Other Ambulatory Visit: Payer: Self-pay

## 2018-08-25 ENCOUNTER — Ambulatory Visit (HOSPITAL_BASED_OUTPATIENT_CLINIC_OR_DEPARTMENT_OTHER): Payer: Medicare Other | Admitting: Pain Medicine

## 2018-08-25 ENCOUNTER — Encounter: Payer: Self-pay | Admitting: Pain Medicine

## 2018-08-25 ENCOUNTER — Ambulatory Visit
Admission: RE | Admit: 2018-08-25 | Discharge: 2018-08-25 | Disposition: A | Discharge status: Home / Self Care | Payer: Medicare Other | Source: Ambulatory Visit | Attending: Pain Medicine | Admitting: Pain Medicine

## 2018-08-25 VITALS — BP 154/82 | HR 103 | Temp 98.2°F | Resp 22 | Ht 69.0 in | Wt 220.0 lb

## 2018-08-25 DIAGNOSIS — M47816 Spondylosis without myelopathy or radiculopathy, lumbar region: Secondary | ICD-10-CM | POA: Diagnosis not present

## 2018-08-25 DIAGNOSIS — M961 Postlaminectomy syndrome, not elsewhere classified: Secondary | ICD-10-CM | POA: Insufficient documentation

## 2018-08-25 DIAGNOSIS — M47817 Spondylosis without myelopathy or radiculopathy, lumbosacral region: Secondary | ICD-10-CM | POA: Diagnosis present

## 2018-08-25 DIAGNOSIS — Z7901 Long term (current) use of anticoagulants: Secondary | ICD-10-CM | POA: Insufficient documentation

## 2018-08-25 DIAGNOSIS — M5442 Lumbago with sciatica, left side: Secondary | ICD-10-CM

## 2018-08-25 DIAGNOSIS — G8929 Other chronic pain: Secondary | ICD-10-CM

## 2018-08-25 MED ORDER — ROPIVACAINE HCL 2 MG/ML IJ SOLN
INTRAMUSCULAR | Status: AC
  Filled 2018-08-25: qty 10

## 2018-08-25 MED ORDER — TRIAMCINOLONE ACETONIDE 40 MG/ML IJ SUSP
40.0000 mg | Freq: Once | INTRAMUSCULAR | Status: AC
  Administered 2018-08-25: 09:00:00 40 mg

## 2018-08-25 MED ORDER — FENTANYL CITRATE (PF) 100 MCG/2ML IJ SOLN: 25.0000 ug | INTRAMUSCULAR | Status: DC | PRN

## 2018-08-25 MED ORDER — TRIAMCINOLONE ACETONIDE 40 MG/ML IJ SUSP
INTRAMUSCULAR | Status: AC
  Filled 2018-08-25: qty 1

## 2018-08-25 MED ORDER — MIDAZOLAM HCL 5 MG/5ML IJ SOLN: 1.0000 mg | INTRAMUSCULAR | Status: DC | PRN

## 2018-08-25 MED ORDER — LACTATED RINGERS IV SOLN: 1000.0000 mL | Freq: Once | INTRAVENOUS | Status: DC

## 2018-08-25 MED ORDER — LIDOCAINE HCL 2 % IJ SOLN
INTRAMUSCULAR | Status: AC
  Filled 2018-08-25: qty 20

## 2018-08-25 MED ORDER — LIDOCAINE HCL 2 % IJ SOLN
20.0000 mL | Freq: Once | INTRAMUSCULAR | Status: AC
  Administered 2018-08-25: 09:00:00 400 mg

## 2018-08-25 MED ORDER — ROPIVACAINE HCL 2 MG/ML IJ SOLN
9.0000 mL | Freq: Once | INTRAMUSCULAR | Status: AC
  Administered 2018-08-25: 9 mL via PERINEURAL

## 2018-08-25 NOTE — Patient Instructions (Signed)

## 2018-08-25 NOTE — Progress Notes (Signed)
Safety precautions to be maintained throughout the outpatient stay will include: orient to surroundings, keep bed in low position, maintain call bell within reach at all times, provide assistance with transfer out of bed and ambulation.  

## 2018-08-25 NOTE — Progress Notes (Signed)
Patient's Name: Kenneth Osborn  MRN: [id number]  Referring Provider: [name], MD  DOB: November 08, 1955  PCP: [name], MD  DOS: 08/25/2018  Note by: Gaspar Cola, MD  Service setting: Ambulatory outpatient  Specialty: Interventional Pain Management  Patient type: Established  Location: [facility] (AMB) Pain Management Facility  Visit type: Interventional Procedure   Primary Reason for Visit: Interventional Pain Management Treatment. CC: Back Pain  Procedure:          Anesthesia, Analgesia, Anxiolysis:  Type: Lumbar Facet, Medial Branch Block(s) #1  Primary Purpose: Diagnostic Region: Posterolateral Lumbosacral Spine Level: L2, L3, L4, L5, & S1 Medial Branch Level(s). Injecting these levels blocks the L3-4, L4-5, and L5-S1 lumbar facet joints. Laterality: Right  Type: Local Anesthesia Indication(s): Analgesia         Route: Infiltration (Coleman/IM) IV Access: Declined Sedation: Declined  Local Anesthetic: Lidocaine 1-2%  Position: Prone   Indications: 1. Lumbar facet syndrome (Bilateral) (R>L)   2. Spondylosis without myelopathy or radiculopathy, lumbosacral region   3. Lumbar spondylosis   4. Failed back surgical syndrome   5. Chronic low back pain (Bilateral) (L>R)   6. Chronic anticoagulation (Effient)    Pain Score: Pre-procedure: 5 /10 Post-procedure: 5 /10  Pre-op Assessment:  Kenneth Osborn is a 63 y.o. (year old), male patient, seen today for interventional treatment. He  has a past surgical history that includes Cardiovascular stress test; Cardiac catheterization; Coronary angioplasty; Labrinthectomy; mastoid shunt; Appendectomy; Back surgery; Shoulder arthroscopy with subacromial decompression (Left, 04/06/2012); ARTHRODESIS ANTERIOR ANTERIOR CERVICLE SPINE (01/04/2013); Colonoscopy with propofol (N/A, 09/19/2014); Esophagogastroduodenoscopy (N/A, 09/19/2014); Savory dilation (N/A, 09/19/2014); Spinal cord stimulator implant (Right); Cardiac catheterization (N/A, 06/15/2015); and  Cardiac catheterization (N/A, 06/15/2015). Kenneth Osborn has a current medication list which includes the following prescription(s): alprazolam, aspirin ec, cetirizine, cyclobenzaprine, diazepam, diltiazem, enalapril, esomeprazole, ezetimibe, gabapentin, humulin r u-500 kwikpen, isosorbide mononitrate, magnesium oxide, meclizine, metformin, metoprolol succinate, montelukast, naloxone, niacin, nitroglycerin, fish oil, oxycodone hcl, oxycodone hcl, prasugrel, rosuvastatin, and oxycodone hcl. His primarily concern today is the Back Pain  Initial Vital Signs:  Pulse/HCG Rate: (!) 112  Temp: 98.2 F (36.8 C) Resp: 18 BP: (!) 150/84 SpO2: 98 %  BMI: Estimated body mass index is 32.49 kg/m as calculated from the following:   Height as of this encounter: 5' 9"  (1.753 m).   Weight as of this encounter: 220 lb (99.8 kg).  Risk Assessment: Allergies: Reviewed. He has No Known Allergies.  Allergy Precautions: None required Coagulopathies: Reviewed. None identified.  Blood-thinner therapy: None at this time Active Infection(s): Reviewed. None identified. Kenneth Osborn is afebrile  Site Confirmation: Kenneth Osborn was asked to confirm the procedure and laterality before marking the site Procedure checklist: Completed Consent: Before the procedure and under the influence of no sedative(s), amnesic(s), or anxiolytics, the patient was informed of the treatment options, risks and possible complications. To fulfill our ethical and legal obligations, as recommended by the American Medical Association's Code of Ethics, I have informed the patient of my clinical impression; the nature and purpose of the treatment or procedure; the risks, benefits, and possible complications of the intervention; the alternatives, including doing nothing; the risk(s) and benefit(s) of the alternative treatment(s) or procedure(s); and the risk(s) and benefit(s) of doing nothing. The patient was provided information about the general risks and  possible complications associated with the procedure. These may include, but are not limited to: failure to achieve desired goals, infection, bleeding, organ or nerve damage, allergic reactions, paralysis, and death. In  addition, the patient was informed of those risks and complications associated to Spine-related procedures, such as failure to decrease pain; infection (i.e.: Meningitis, epidural or intraspinal abscess); bleeding (i.e.: epidural hematoma, subarachnoid hemorrhage, or any other type of intraspinal or peri-dural bleeding); organ or nerve damage (i.e.: Any type of peripheral nerve, nerve root, or spinal cord injury) with subsequent damage to sensory, motor, and/or autonomic systems, resulting in permanent pain, numbness, and/or weakness of one or several areas of the body; allergic reactions; (i.e.: anaphylactic reaction); and/or death. Furthermore, the patient was informed of those risks and complications associated with the medications. These include, but are not limited to: allergic reactions (i.e.: anaphylactic or anaphylactoid reaction(s)); adrenal axis suppression; blood sugar elevation that in diabetics may result in ketoacidosis or comma; water retention that in patients with history of congestive heart failure may result in shortness of breath, pulmonary edema, and decompensation with resultant heart failure; weight gain; swelling or edema; medication-induced neural toxicity; particulate matter embolism and blood vessel occlusion with resultant organ, and/or nervous system infarction; and/or aseptic necrosis of one or more joints. Finally, the patient was informed that Medicine is not an exact science; therefore, there is also the possibility of unforeseen or unpredictable risks and/or possible complications that may result in a catastrophic outcome. The patient indicated having understood very clearly. We have given the patient no guarantees and we have made no promises. Enough time was  given to the patient to ask questions, all of which were answered to the patient's satisfaction. Kenneth Osborn has indicated that he wanted to continue with the procedure. Attestation: I, the ordering provider, attest that I have discussed with the patient the benefits, risks, side-effects, alternatives, likelihood of achieving goals, and potential problems during recovery for the procedure that I have provided informed consent. Date   Time: 08/25/2018  9:07 AM  Pre-Procedure Preparation:  Monitoring: As per clinic protocol. Respiration, ETCO2, SpO2, BP, heart rate and rhythm monitor placed and checked for adequate function Safety Precautions: Patient was assessed for positional comfort and pressure points before starting the procedure. Time-out: I initiated and conducted the "Time-out" before starting the procedure, as per protocol. The patient was asked to participate by confirming the accuracy of the "Time Out" information. Verification of the correct person, site, and procedure were performed and confirmed by me, the nursing staff, and the patient. "Time-out" conducted as per Joint Commission's Universal Protocol (UP.01.01.01). Time: 0938  Description of Procedure:          Laterality: Right Levels:  L2, L3, L4, L5, & S1 Medial Branch Level(s) Area Prepped: Posterior Lumbosacral Region Prepping solution: DuraPrep (Iodine Povacrylex [0.7% available iodine] and Isopropyl Alcohol, 74% w/w) Safety Precautions: Aspiration looking for blood return was conducted prior to all injections. At no point did we inject any substances, as a needle was being advanced. Before injecting, the patient was told to immediately notify me if he was experiencing any new onset of "ringing in the ears, or metallic taste in the mouth". No attempts were made at seeking any paresthesias. Safe injection practices and needle disposal techniques used. Medications properly checked for expiration dates. SDV (single dose vial) medications  used. After the completion of the procedure, all disposable equipment used was discarded in the proper designated medical waste containers. Local Anesthesia: Protocol guidelines were followed. The patient was positioned over the fluoroscopy table. The area was prepped in the usual manner. The time-out was completed. The target area was identified using fluoroscopy. A 12-in long, straight, sterile  hemostat was used with fluoroscopic guidance to locate the targets for each level blocked. Once located, the skin was marked with an approved surgical skin marker. Once all sites were marked, the skin (epidermis, dermis, and hypodermis), as well as deeper tissues (fat, connective tissue and muscle) were infiltrated with a small amount of a short-acting local anesthetic, loaded on a 10cc syringe with a 25G, 1.5-in  Needle. An appropriate amount of time was allowed for local anesthetics to take effect before proceeding to the next step. Local Anesthetic: Lidocaine 2.0% The unused portion of the local anesthetic was discarded in the proper designated containers. Technical explanation of process:  L2 Medial Branch Nerve Block (MBB): The target area for the L2 medial branch is at the junction of the postero-lateral aspect of the superior articular process and the superior, posterior, and medial edge of the transverse process of L3. Under fluoroscopic guidance, a Quincke needle was inserted until contact was made with os over the superior postero-lateral aspect of the pedicular shadow (target area). After negative aspiration for blood, 0.5 mL of the nerve block solution was injected without difficulty or complication. The needle was removed intact. L3 Medial Branch Nerve Block (MBB): The target area for the L3 medial branch is at the junction of the postero-lateral aspect of the superior articular process and the superior, posterior, and medial edge of the transverse process of L4. Under fluoroscopic guidance, a Quincke  needle was inserted until contact was made with os over the superior postero-lateral aspect of the pedicular shadow (target area). After negative aspiration for blood, 0.5 mL of the nerve block solution was injected without difficulty or complication. The needle was removed intact. L4 Medial Branch Nerve Block (MBB): The target area for the L4 medial branch is at the junction of the postero-lateral aspect of the superior articular process and the superior, posterior, and medial edge of the transverse process of L5. Under fluoroscopic guidance, a Quincke needle was inserted until contact was made with os over the superior postero-lateral aspect of the pedicular shadow (target area). After negative aspiration for blood, 0.5 mL of the nerve block solution was injected without difficulty or complication. The needle was removed intact. L5 Medial Branch Nerve Block (MBB): The target area for the L5 medial branch is at the junction of the postero-lateral aspect of the superior articular process and the superior, posterior, and medial edge of the sacral ala. Under fluoroscopic guidance, a Quincke needle was inserted until contact was made with os over the superior postero-lateral aspect of the pedicular shadow (target area). After negative aspiration for blood, 0.5 mL of the nerve block solution was injected without difficulty or complication. The needle was removed intact. S1 Medial Branch Nerve Block (MBB): The target area for the S1 medial branch is at the posterior and inferior 6 o'clock position of the L5-S1 facet joint. Under fluoroscopic guidance, the Quincke needle inserted for the L5 MBB was redirected until contact was made with os over the inferior and postero aspect of the sacrum, at the 6 o' clock position under the L5-S1 facet joint (Target area). After negative aspiration for blood, 0.5 mL of the nerve block solution was injected without difficulty or complication. The needle was removed intact.  Nerve  block solution: 0.2% PF-Ropivacaine + Triamcinolone (40 mg/mL) diluted to a final concentration of 4 mg of Triamcinolone/mL of Ropivacaine The unused portion of the solution was discarded in the proper designated containers. Procedural Needles: 22-gauge, 3.5-inch, Quincke needles used for all levels.  Once the entire procedure was completed, the treated area was cleaned, making sure to leave some of the prepping solution back to take advantage of its long term bactericidal properties.   Illustration of the posterior view of the lumbar spine and the posterior neural structures. Laminae of L2 through S1 are labeled. DPRL5, dorsal primary ramus of L5; DPRS1, dorsal primary ramus of S1; DPR3, dorsal primary ramus of L3; FJ, facet (zygapophyseal) joint L3-L4; I, inferior articular process of L4; LB1, lateral branch of dorsal primary ramus of L1; IAB, inferior articular branches from L3 medial branch (supplies L4-L5 facet joint); IBP, intermediate branch plexus; MB3, medial branch of dorsal primary ramus of L3; NR3, third lumbar nerve root; S, superior articular process of L5; SAB, superior articular branches from L4 (supplies L4-5 facet joint also); TP3, transverse process of L3.  Vitals:   08/25/18 0906 08/25/18 0935 08/25/18 0940 08/25/18 0947  BP: (!) 150/84 (!) 111/94 (!) 143/94 (!) 154/82  Pulse: (!) 112 (!) 107 (!) 102 (!) 103  Resp: 18 17 (!) 23 (!) 22  Temp: 98.2 F (36.8 C)     SpO2: 98% 97% 96% 97%  Weight: 220 lb (99.8 kg)     Height: 5' 9"  (1.753 m)        Start Time: 0938 hrs. End Time: 0944 hrs.  Imaging Guidance (Spinal):          Type of Imaging Technique: Fluoroscopy Guidance (Spinal) Indication(s): Assistance in needle guidance and placement for procedures requiring needle placement in or near specific anatomical locations not easily accessible without such assistance. Exposure Time: Please see nurses notes. Contrast: None used. Fluoroscopic Guidance: I was personally present  during the use of fluoroscopy. "Tunnel Vision Technique" used to obtain the best possible view of the target area. Parallax error corrected before commencing the procedure. "Direction-depth-direction" technique used to introduce the needle under continuous pulsed fluoroscopy. Once target was reached, antero-posterior, oblique, and lateral fluoroscopic projection used confirm needle placement in all planes. Images permanently stored in EMR. Interpretation: No contrast injected. I personally interpreted the imaging intraoperatively. Adequate needle placement confirmed in multiple planes. Permanent images saved into the patient's record.  Antibiotic Prophylaxis:   Anti-infectives (From admission, onward)   None     Indication(s): None identified  Post-operative Assessment:  Post-procedure Vital Signs:  Pulse/HCG Rate: (!) 103  Temp: 98.2 F (36.8 C) Resp: (!) 22 BP: (!) 154/82 SpO2: 97 %  EBL: None  Complications: No immediate post-treatment complications observed by team, or reported by patient.  Note: The patient tolerated the entire procedure well. A repeat set of vitals were taken after the procedure and the patient was kept under observation following institutional policy, for this type of procedure. Post-procedural neurological assessment was performed, showing return to baseline, prior to discharge. The patient was provided with post-procedure discharge instructions, including a section on how to identify potential problems. Should any problems arise concerning this procedure, the patient was given instructions to immediately contact us, at any time, without hesitation. In any case, we plan to contact the patient by telephone for a follow-up status report regarding this interventional procedure.  Comments:  No additional relevant information.  Plan of Care  Orders:  Orders Placed This Encounter  Procedures   LUMBAR FACET(MEDIAL BRANCH NERVE BLOCK) MBNB    Scheduling  Instructions:     Side: Right-sided     Level: L3-4, L4-5, & L5-S1 Facets (L2, L3, L4, L5, & S1 Medial Branch Nerves)  Sedation: With Sedation.     Timeframe: Today    Order Specific Question:   Where will this procedure be performed?    Answer:   [facility]   DG PAIN CLIN[facility] C-ARM 1-60 MIN NO REPORT    Intraoperative interpretation by procedural physician at [place].    Standing Status:   Standing    Number of Occurrences:   1    Order Specific Question:   Reason for exam:    Answer:   Assistance in needle guidance and placement for procedures requiring needle placement in or near specific anatomical locations not easily accessible without such assistance.   Care order/instruction: Please confirm that the patient has stopped the Effient (Prasugrel) x 10 days prior to procedure or surgery.    Please confirm that the patient has stopped the Effient (Prasugrel) x 10 days prior to procedure or surgery.    Standing Status:   Standing    Number of Occurrences:   1   Provider attestation of informed consent for procedure/surgical case    I, the ordering provider, attest that I have discussed with the patient the benefits, risks, side effects, alternatives, likelihood of achieving goals and potential problems during recovery for the procedure that I have provided informed consent.    Standing Status:   Standing    Number of Occurrences:   1   Informed Consent Details: Transcribe to consent form and obtain patient signature    Standing Status:   Standing    Number of Occurrences:   1    Order Specific Question:   Procedure    Answer:   Lumbar facet block (medial branch block) under fluoroscopic guidance. (See notes for levels and laterality.)    Order Specific Question:   Surgeon    Answer:   Alister Staver A. Dossie Arbour, MD    Order Specific Question:   Indication/Reason    Answer:   Low back pain with or without lower extremity pain.   Chronic Opioid Analgesic:   Oxycodone IR 10 mg every 6 hours (40 mg/day) MME/day:60 mg/day.   Medications ordered for procedure: Meds ordered this encounter  Medications   lidocaine (XYLOCAINE) 2 % (with pres) injection 400 mg   DISCONTD: lactated ringers infusion 1,000 mL   DISCONTD: midazolam (VERSED) 5 MG/5ML injection 1-2 mg    Make sure Flumazenil is available in the pyxis when using this medication. If oversedation occurs, administer 0.2 mg IV over 15 sec. If after 45 sec no response, administer 0.2 mg again over 1 min; may repeat at 1 min intervals; not to exceed 4 doses (1 mg)   DISCONTD: fentaNYL (SUBLIMAZE) injection 25-50 mcg    Make sure Narcan is available in the pyxis when using this medication. In the event of respiratory depression (RR< 8/min): Titrate NARCAN (naloxone) in increments of 0.1 to 0.2 mg IV at 2-3 minute intervals, until desired degree of reversal.   ropivacaine (PF) 2 mg/mL (0.2%) (NAROPIN) injection 9 mL   triamcinolone acetonide (KENALOG-40) injection 40 mg   Medications administered: We administered lidocaine, ropivacaine (PF) 2 mg/mL (0.2%), and triamcinolone acetonide.  See the medical record for exact dosing, route, and time of administration.  Follow-up plan:   Return in 2 weeks (on 09/08/2018) for (VV), E/M (PP).      Considering: NOTE: EFFIENT Anticoagulation (Stop: 7-10 days; Resume: 6 hrs) Diagnostic right-sided lumbar facet block #2    Palliative PRN treatment(s): Palliative right-sided PSIS MNB/TPI     Recent Visits  Date Type Provider Dept  06/18/18 Office Visit Vevelyn Francois, NP [place] recent visits within past 90 days and meeting all other requirements   Today's Visits Date Type Provider Dept  08/25/18 Procedure visit Milinda Pointer, MD [facility]  Showing today's visits and meeting all other requirements   Future Appointments Date Type Provider Dept  09/10/18 Appointment Milinda Pointer, MD [facility]  09/23/18 Appointment Milinda Pointer, MD [facility]  Showing future appointments within next 90 days and meeting all other requirements   Disposition: Discharge home  Discharge Date & Time: 08/25/2018; 1001 hrs.   Primary Care Physician: [name], MD Location: [facility] Note by: Gaspar Cola, MD Date: 08/25/2018; Time: 12:47 PM  Disclaimer:  Medicine is not an Chief Strategy Officer. The only guarantee in medicine is that nothing is guaranteed. It is important to note that the decision to proceed with this intervention was based on the information collected from the patient. The Data and conclusions were drawn from the patient's questionnaire, the interview, and the physical examination. Because the information was provided in large part by the patient, it cannot be guaranteed that it has not been purposely or unconsciously manipulated. Every effort has been made to obtain as much relevant data as possible for this evaluation. It is important to note that the conclusions that lead to this procedure are derived in large part from the available data. Always take into account that the treatment will also be dependent on availability of resources and existing treatment guidelines, considered by other Pain Management Practitioners as being common knowledge and practice, at the time of the intervention. For Medico-Legal purposes, it is also important to point out that variation in procedural techniques and pharmacological choices are the acceptable norm. The indications, contraindications, technique, and results of the above procedure should only be interpreted and judged by a Board-Certified Interventional Pain Specialist with extensive familiarity and expertise in the same exact procedure and technique.

## 2018-08-26 ENCOUNTER — Telehealth: Payer: Self-pay | Admitting: *Deleted

## 2018-08-26 NOTE — Telephone Encounter (Signed)
Spoke with patient and he denies any questions or concerns re; procedure on yesterday.

## 2018-09-09 ENCOUNTER — Encounter: Payer: Self-pay | Admitting: Pain Medicine

## 2018-09-10 ENCOUNTER — Other Ambulatory Visit: Payer: Self-pay

## 2018-09-10 ENCOUNTER — Ambulatory Visit: Payer: Medicare Other | Attending: Pain Medicine | Admitting: Pain Medicine

## 2018-09-10 DIAGNOSIS — M7918 Myalgia, other site: Secondary | ICD-10-CM

## 2018-09-10 DIAGNOSIS — G894 Chronic pain syndrome: Secondary | ICD-10-CM | POA: Diagnosis not present

## 2018-09-10 DIAGNOSIS — M47814 Spondylosis without myelopathy or radiculopathy, thoracic region: Secondary | ICD-10-CM | POA: Diagnosis not present

## 2018-09-10 DIAGNOSIS — M961 Postlaminectomy syndrome, not elsewhere classified: Secondary | ICD-10-CM | POA: Diagnosis not present

## 2018-09-10 DIAGNOSIS — G8929 Other chronic pain: Secondary | ICD-10-CM

## 2018-09-10 DIAGNOSIS — M47816 Spondylosis without myelopathy or radiculopathy, lumbar region: Secondary | ICD-10-CM | POA: Diagnosis not present

## 2018-09-10 DIAGNOSIS — K219 Gastro-esophageal reflux disease without esophagitis: Secondary | ICD-10-CM

## 2018-09-10 DIAGNOSIS — M792 Neuralgia and neuritis, unspecified: Secondary | ICD-10-CM

## 2018-09-10 DIAGNOSIS — E0842 Diabetes mellitus due to underlying condition with diabetic polyneuropathy: Secondary | ICD-10-CM

## 2018-09-10 DIAGNOSIS — M47894 Other spondylosis, thoracic region: Secondary | ICD-10-CM

## 2018-09-10 DIAGNOSIS — K2270 Barrett's esophagus without dysplasia: Secondary | ICD-10-CM

## 2018-09-10 DIAGNOSIS — G629 Polyneuropathy, unspecified: Secondary | ICD-10-CM

## 2018-09-10 MED ORDER — OXYCODONE HCL 10 MG PO TABS: 10.0000 mg | ORAL_TABLET | Freq: Four times a day (QID) | ORAL | 0 refills | Status: DC | PRN

## 2018-09-10 MED ORDER — GABAPENTIN 800 MG PO TABS: 1600.0000 mg | ORAL_TABLET | Freq: Two times a day (BID) | ORAL | 2 refills | Status: DC

## 2018-09-10 MED ORDER — MAGNESIUM OXIDE -MG SUPPLEMENT 500 MG PO TABS: 500.0000 mg | ORAL_TABLET | Freq: Every day | ORAL | 0 refills | Status: DC

## 2018-09-10 MED ORDER — CYCLOBENZAPRINE HCL 10 MG PO TABS: 10.0000 mg | ORAL_TABLET | Freq: Three times a day (TID) | ORAL | 0 refills | Status: DC | PRN

## 2018-09-10 NOTE — Progress Notes (Signed)
Pain Management Virtual Encounter Note - Virtual Visit via Telephone Telehealth (real-time audio visits between healthcare provider and patient).   Patient's Phone No. & Preferred Pharmacy:  [phone] (home); [phone] (mobile); (Preferred) [phone] [email or web address]  [place], [place] - [address] [place] [address] Phone: [phone] Fax: [phone]    Pre-screening note:  Our staff contacted Mr. Murtagh and offered him an "in person", "face-to-face" appointment versus a telephone encounter. He indicated preferring the telephone encounter, at this time.   Reason for Virtual Visit: COVID-19*  Social distancing based on CDC and AMA recommendations.   I contacted REYDEN SMITH on 09/10/2018 via telephone.      I clearly identified myself as Gaspar Cola, MD. I verified that I was speaking with the correct person using two identifiers (Name: OLAND ARQUETTE, and date of birth: 1956/02/07).  Advanced Informed Consent I sought verbal advanced consent from [name] for virtual visit interactions. I informed Mr. Bitter of possible security and privacy concerns, risks, and limitations associated with providing "not-in-person" medical evaluation and management services. I also informed Mr. Alred of the availability of "in-person" appointments. Finally, I informed him that there would be a charge for the virtual visit and that he could be  personally, fully or partially, financially responsible for it. Mr. Spratling expressed understanding and agreed to proceed.   Historic Elements   Mr. FELIBERTO STOCKLEY is a 63 y.o. year old, male patient evaluated today after his last encounter by our practice on 08/26/2018. Mr. Kneale  has a past medical history of Allergic rhinitis (12/30/2012), Bronchitis, Can't get food down (08/12/2014), Chronic back pain, Concussion (09/2015), COPD (chronic obstructive pulmonary disease) ([place]), Coronary  artery disease, DDD (degenerative disc disease), cervical, Dehydration symptoms, Diabetes mellitus without complication ([place]), Dysphagia, GERD (gastroesophageal reflux disease), History of Meniere's disease (12/21/2014), History of thoracic spine surgery (S/P T9-10 IVD spacer) (12/21/2014), Hypercholesteremia, Hyperlipidemia, Hypertension, Meniere's disease, Myocardial infarction ([place]), Neuromuscular disorder ([place]), Psychosis ([place]), Short-segment Barrett's esophagus, and Sleep apnea. He also  has a past surgical history that includes Cardiovascular stress test; Cardiac catheterization; Coronary angioplasty; Labrinthectomy; mastoid shunt; Appendectomy; Back surgery; Shoulder arthroscopy with subacromial decompression (Left, 04/06/2012); ARTHRODESIS ANTERIOR ANTERIOR CERVICLE SPINE (01/04/2013); Colonoscopy with propofol (N/A, 09/19/2014); Esophagogastroduodenoscopy (N/A, 09/19/2014); Savory dilation (N/A, 09/19/2014); Spinal cord stimulator implant (Right); Cardiac catheterization (N/A, 06/15/2015); and Cardiac catheterization (N/A, 06/15/2015). Mr. Robin has a current medication list which includes the following prescription(s): alprazolam, aspirin ec, cetirizine, cyclobenzaprine, diazepam, diltiazem, enalapril, esomeprazole, ezetimibe, gabapentin, humulin r u-500 kwikpen, isosorbide mononitrate, magnesium oxide, meclizine, metformin, metoprolol succinate, montelukast, naloxone, niacin, nitroglycerin, fish oil, oxycodone hcl, oxycodone hcl, oxycodone hcl, prasugrel, and rosuvastatin. He  reports that he has never smoked. He has never used smokeless tobacco. He reports that he does not drink alcohol or use drugs. Mr. Rallis has No Known Allergies.   HPI  Today, he is being contacted for both, medication management and a post-procedure assessment.   Post-Procedure Evaluation  Procedure: Diagnostic right-sided lumbar facet block #1 under fluoroscopic guidance, no sedation Pre-procedure pain level:  5/10 Post-procedure:  5/10 No initial benefit, possibly due to rapid discharge after no sedation procedure, without enough time to allow full onset of block.  Sedation: None.  Effectiveness during initial hour after procedure(Ultra-Short Term Relief): 100 %   Local anesthetic used: Long-acting (4-6 hours) Effectiveness: Defined as any analgesic benefit obtained secondary to the administration of local anesthetics. This carries significant diagnostic value  as to the etiological location, or anatomical origin, of the pain. Duration of benefit is expected to coincide with the duration of the local anesthetic used.  Effectiveness during initial 4-6 hours after procedure(Short-Term Relief): 90 %   Long-term benefit: Defined as any relief past the pharmacologic duration of the local anesthetics.  Effectiveness past the initial 6 hours after procedure(Long-Term Relief): 50 %(present)   Current benefits: Defined as benefit that persist at this time.   Analgesia:  >50% relief Function: Mr. Enriques reports improvement in function ROM: Mr. Buchberger reports improvement in ROM  Note: Today the patient took the time to underlying how much functional benefit he gets from this initial diagnostic injection.  He refers that while in the past he was having some difficulty achieving all of his ADLs, at this point he is having none.  When questioned about the 50% benefit, he pointed out that he thinks that the reason why he cannot describe a full 100% relief is because we only did the right side and he has now noticed that he has some issues on the left as well.  Because of this, we will reschedule this and this time we will do both sides.  Pharmacotherapy Assessment  Analgesic: Oxycodone IR 10 mg, 1 tab PO q 6 hrs (40 mg/day of oxycodone) MME/day:60 mg/day.   Monitoring: Pharmacotherapy: No side-effects or adverse reactions reported. [place] PMP: PDMP reviewed during this encounter.       Compliance: No problems identified. Effectiveness:  Clinically acceptable. Plan: Refer to "POC".  Pertinent Labs   SAFETY SCREENING Profile Lab Results  Component Value Date   SARSCOV2NAA NEGATIVE 08/19/2018   STAPHAUREUS POSITIVE (A) 04/01/2012   MRSAPCR NEGATIVE 04/01/2012   HIV Non Reactive 02/19/2018   Renal Function Lab Results  Component Value Date   BUN 34 (H) 02/20/2018   CREATININE 1.49 (H) 02/20/2018   GFRAA 57 (L) 02/20/2018   GFRNONAA 49 (L) 02/20/2018   Hepatic Function Lab Results  Component Value Date   AST 31 01/18/2018   ALT 33 01/18/2018   ALBUMIN 4.1 01/18/2018   UDS Summary  Date Value Ref Range Status  03/18/2018 FINAL  Final    Comment:    ==================================================================== TOXASSURE SELECT 13 (MW) ==================================================================== Test                             Result       Flag       Units Drug Present and Declared for Prescription Verification   Desmethyldiazepam              123          EXPECTED   ng/mg creat   Oxazepam                       >897         EXPECTED   ng/mg creat   Temazepam                      291          EXPECTED   ng/mg creat    Desmethyldiazepam, oxazepam, and temazepam are expected    metabolites of diazepam. Desmethyldiazepam and oxazepam are also    expected metabolites of other drugs, including chlordiazepoxide,    prazepam, clorazepate, and halazepam. Oxazepam is an expected    metabolite of temazepam. Oxazepam and temazepam are also  available as scheduled prescription medications.   Oxycodone                      1956         EXPECTED   ng/mg creat   Oxymorphone                    4052         EXPECTED   ng/mg creat   Noroxycodone                   957          EXPECTED   ng/mg creat   Noroxymorphone                 485          EXPECTED   ng/mg creat    Sources of oxycodone are scheduled prescription medications.    Oxymorphone, noroxycodone, and noroxymorphone are expected    metabolites  of oxycodone. Oxymorphone is also available as a    scheduled prescription medication. Drug Absent but Declared for Prescription Verification   Alprazolam                     Not Detected UNEXPECTED ng/mg creat ==================================================================== Test                      Result    Flag   Units      Ref Range   Creatinine              223              mg/dL      >=20 ==================================================================== Declared Medications:  The flagging and interpretation on this report are based on the  following declared medications.  Unexpected results may arise from  inaccuracies in the declared medications.  **Note: The testing scope of this panel includes these medications:  Alprazolam (Xanax)  Diazepam (Valium)  Oxycodone  **Note: The testing scope of this panel does not include following  reported medications:  Aspirin  Cetirizine (Zyrtec)  Cyclobenzaprine (Flexeril)  Diltiazem (Cardizem)  Ezetimibe (Zetia)  Gabapentin (Neurontin)  Insulin (Humulin)  Isosorbide (Imdur)  Magnesium  Meclizine (Antivert)  Metformin (Glucophage)  Metoprolol (Toprol)  Montelukast (Singulair)  Naloxone (Narcan)  Niacin (Niaspan)  Nitroglycerin (Nitrostat)  Omega-3 Fatty Acids (Fish Oil)  Omeprazole (Nexium)  Prasugrel (Effient)  Rosuvastatin (Crestor) ==================================================================== For clinical consultation, please call [phone]. ====================================================================    Note: Above Lab results reviewed.  Recent imaging  DG PAIN CLINIC C-ARM 1-60 MIN NO REPORT Fluoro was used, but no Radiologist interpretation will be provided.  Please refer to "NOTES" tab for provider progress note.  Assessment  The primary encounter diagnosis was Lumbar facet syndrome (Bilateral) (R>L). 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 Beverely Pace at [facility] on  01/04/2013), Thoracic facet syndrome (T8-10), Chronic pain syndrome, Chronic musculoskeletal pain, Musculoskeletal pain, Neuropathic pain, Polyneuropathy, Diabetic polyneuropathy associated with diabetes mellitus due to underlying condition ([place]), Gastroesophageal reflux disease without esophagitis, and Barrett's esophagus without dysplasia were also pertinent to this visit.  Plan of Care  I have discontinued Ibrohim Simmers. Weltz's Oxycodone HCl and Oxycodone HCl. I have also changed his Oxycodone HCl and Magnesium Oxide. Additionally, I am having him start on Oxycodone HCl and Oxycodone HCl. Lastly, I am having him maintain his diazepam, cetirizine, metoprolol succinate, prasugrel, esomeprazole, niacin, aspirin EC, ALPRAZolam, meclizine,  Fish Oil, rosuvastatin, montelukast, ezetimibe, naloxone, HumuLIN R U-500 KwikPen, isosorbide mononitrate, nitroGLYCERIN, metFORMIN, enalapril, diltiazem, cyclobenzaprine, and gabapentin.  Pharmacotherapy (Medications Ordered): Meds ordered this encounter  Medications  . Oxycodone HCl 10 MG TABS    Sig: Take 1 tablet (10 mg total) by mouth every 6 (six) hours as needed. Must last 30 days    Dispense:  120 tablet    Refill:  0    Chronic Pain: STOP Act (Not applicable) Fill 1 day early if closed on refill date. Do not fill until: 09/27/2018. To last until: 10/27/2018. Avoid benzodiazepines within 8 hours of opioids  . cyclobenzaprine (FLEXERIL) 10 MG tablet    Sig: Take 1 tablet (10 mg total) by mouth every 8 (eight) hours as needed for muscle spasms.    Dispense:  270 tablet    Refill:  0    Fill one day early if pharmacy is closed on scheduled refill date. May substitute for generic if available.  . gabapentin (NEURONTIN) 800 MG tablet    Sig: Take 2 tablets (1,600 mg total) by mouth 2 (two) times daily.    Dispense:  120 tablet    Refill:  2    Fill one day early if pharmacy is closed on scheduled refill date. May substitute for generic if available.  . Magnesium  Oxide 500 MG TABS    Sig: Take 1 tablet (500 mg total) by mouth daily.    Dispense:  90 tablet    Refill:  0    Fill one day early if pharmacy is closed on scheduled refill date. May substitute for generic if available.  . Oxycodone HCl 10 MG TABS    Sig: Take 1 tablet (10 mg total) by mouth every 6 (six) hours as needed. Must last 30 days    Dispense:  120 tablet    Refill:  0    Chronic Pain: STOP Act (Not applicable) Fill 1 day early if closed on refill date. Do not fill until: 10/27/2018. To last until: 11/26/2018. Avoid benzodiazepines within 8 hours of opioids  . Oxycodone HCl 10 MG TABS    Sig: Take 1 tablet (10 mg total) by mouth every 6 (six) hours as needed. Must last 30 days    Dispense:  120 tablet    Refill:  0    Chronic Pain: STOP Act (Not applicable) Fill 1 day early if closed on refill date. Do not fill until: 11/26/2018. To last until: 12/26/2018. Avoid benzodiazepines within 8 hours of opioids   Orders:  Orders Placed This Encounter  Procedures  . LUMBAR FACET(MEDIAL BRANCH NERVE BLOCK) MBNB    Standing Status:   Future    Standing Expiration Date:   10/11/2018    Scheduling Instructions:     Side: Bilateral     Level: L3-4, L4-5, & L5-S1 Facets (L2, L3, L4, L5, & S1 Medial Branch Nerves)     Sedation: Patient's choice.     Timeframe: ASAA    Order Specific Question:   Where will this procedure be performed?    Answer:   [facility]   Follow-up plan:   Return in about 3 months (around 12/23/2018) for (VV), E/M (MM), in addition, Procedure (w/ sedation): (B) L-FCT Blk #2.      Considering: NOTE: EFFIENT Anticoagulation (Stop: 7-10 days; Re-start: 6 hrs) Diagnostic left-sided lumbar facet block #1  Diagnostic right-sided lumbar facet block #2  Possible right-sided lumbar facet RFA  Possible left-sided lumbar facet RFA  Palliative PRN treatment(s): Palliative right-sided PSIS MNB/TPI  Palliative right-sided lumbar facet block #2      Recent  Visits Date Type Provider Dept  08/25/18 Procedure visit Milinda Pointer, MD [facility]  06/18/18 Office Visit Vevelyn Francois, NP [employer] recent visits within past 90 days and meeting all other requirements   Today's Visits Date Type Provider Dept  09/10/18 Office Visit Milinda Pointer, MD [facility]  Showing today's visits and meeting all other requirements   Future Appointments Date Type Provider Dept  09/23/18 Appointment Milinda Pointer, MD [facility]  Showing future appointments within next 90 days and meeting all other requirements   I discussed the assessment and treatment plan with the patient. The patient was provided an opportunity to ask questions and all were answered. The patient agreed with the plan and demonstrated an understanding of the instructions.  Patient advised to call back or seek an in-person evaluation if the symptoms or condition worsens.  Total duration of non-face-to-face encounter: 12 minutes.  Note by: Gaspar Cola, MD Date: 09/10/2018; Time: 2:49 PM  Note: This dictation was prepared with Dragon dictation. Any transcriptional errors that may result from this process are unintentional.  Disclaimer:  * Given the special circumstances of the COVID-19 pandemic, the federal government has announced that the Office for Civil Rights (OCR) will exercise its enforcement discretion and will not impose penalties on physicians using telehealth in the event of noncompliance with regulatory requirements under the [employer] (HIPAA) in connection with the good faith provision of telehealth during the YPPJK-93 national public health emergency. ([place])

## 2018-09-10 NOTE — Patient Instructions (Signed)
____________________________________________________________________________________________  Preparing for Procedure with Sedation  Procedure appointments are limited to planned procedures: . No Prescription Refills. . No disability issues will be discussed. . No medication changes will be discussed.  Instructions: . Oral Intake: Do not eat or drink anything for at least 8 hours prior to your procedure. . Transportation: Public transportation is not allowed. Bring an adult driver. The driver must be physically present in our waiting room before any procedure can be started. Marland Kitchen Physical Assistance: Bring an adult physically capable of assisting you, in the event you need help. This adult should keep you company at home for at least 6 hours after the procedure. . Blood Pressure Medicine: Take your blood pressure medicine with a sip of water the morning of the procedure. . Blood thinners: Notify our staff if you are taking any blood thinners. Depending on which one you take, there will be specific instructions on how and when to stop it. . Diabetics on insulin: Notify the staff so that you can be scheduled 1st case in the morning. If your diabetes requires high dose insulin, take only  of your normal insulin dose the morning of the procedure and notify the staff that you have done so. . Preventing infections: Shower with an antibacterial soap the morning of your procedure. . Build-up your immune system: Take 1000 mg of Vitamin C with every meal (3 times a day) the day prior to your procedure. Marland Kitchen Antibiotics: Inform the staff if you have a condition or reason that requires you to take antibiotics before dental procedures. . Pregnancy: If you are pregnant, call and cancel the procedure. . Sickness: If you have a cold, fever, or any active infections, call and cancel the procedure. . Arrival: You must be in the facility at least 30 minutes prior to your scheduled procedure. . Children: Do not bring  children with you. . Dress appropriately: Bring dark clothing that you would not mind if they get stained. . Valuables: Do not bring any jewelry or valuables.  Reasons to call and reschedule or cancel your procedure: (Following these recommendations will minimize the risk of a serious complication.) . Surgeries: Avoid having procedures within 2 weeks of any surgery. (Avoid for 2 weeks before or after any surgery). . Flu Shots: Avoid having procedures within 2 weeks of a flu shots or . (Avoid for 2 weeks before or after immunizations). . Barium: Avoid having a procedure within 7-10 days after having had a radiological study involving the use of radiological contrast. (Myelograms, Barium swallow or enema study). . Heart attacks: Avoid any elective procedures or surgeries for the initial 6 months after a "Myocardial Infarction" (Heart Attack). . Blood thinners: It is imperative that you stop these medications before procedures. Let us know if you if you take any blood thinner.  . Infection: Avoid procedures during or within two weeks of an infection (including chest colds or gastrointestinal problems). Symptoms associated with infections include: Localized redness, fever, chills, night sweats or profuse sweating, burning sensation when voiding, cough, congestion, stuffiness, runny nose, sore throat, diarrhea, nausea, vomiting, cold or Flu symptoms, recent or current infections. It is specially important if the infection is over the area that we intend to treat. Marland Kitchen Heart and lung problems: Symptoms that may suggest an active cardiopulmonary problem include: cough, chest pain, breathing difficulties or shortness of breath, dizziness, ankle swelling, uncontrolled high or unusually low blood pressure, and/or palpitations. If you are experiencing any of these symptoms, cancel your procedure and contact  your primary care physician for an evaluation.  Remember:  Regular Business hours are:  Monday to Thursday  8:00 AM to 4:00 PM  Provider's Schedule: Milinda Pointer, MD:  Procedure days: Tuesday and Thursday 7:30 AM to 4:00 PM  Gillis Santa, MD:  Procedure days: Monday and Wednesday 7:30 AM to 4:00 PM ____________________________________________________________________________________________   ____________________________________________________________________________________________  Blood Thinners  Recommended Time Interval Before and After Neuraxial Block or Catheter Removal  Drug (Generic) Brand Name Time Before Time After Comments  Abciximab Reopro 15 days 2 hours   Alteplase Activase 10 days 10 days   Apixaban Eliquis 3 days 6 hours   Aspirin > 325 mg Goody Powders/Excedrin 11 days  (Usually not stopped)  Aspirin ? 81 mg  7 days  (Usually not stopped)  Cholesterol Medication Lipitor 4 days    Cilostazol Pletal 3 days 5 hours   Clopidogrel Plavix 7-10 days 2 hours   Dabigatran Pradaxa 5 days 6 hours   Delteparin Fragmin 24 hours 4 hours   Dipyridamole + ASA Aggrenox 11days 2 hours   Enoxaparin  Lovenox 24 hours 4 hours   Eptifibatide Integrillin 8 hours 2 hours   Fish oil  4 days    Fondaparinux  Arixtra 72 hours 12 hours   Garlic supplements  7 days    Ginkgo biloba  36 hours    Ginseng  24 hours    Heparin (IV)  4 hours 2 hours   Heparin ([place])  12 hours 2 hours   Hydroxychloroquine Plaquenil 11 days    LMW Heparin  24 hours    LMWH  24 hours    NSAIDs  3 days  (Usually not stopped)  Prasugrel Effient 7-10 days 6 hours   Reteplase Retavase 10 days 10 days   Rivaroxaban Xarelto 3 days 6 hours   Streptokinase Streptase 10 days 10 days   Tenecteplase TNKase 10 days 10 days   Thrombolytics  10 days  10 days Avoid x 10 days after inj.  Ticagrelor Brilinta 5-7 days 6 hours   Ticlodipine Ticlid 10-14 days 2 hours   Tinzaparin Innohep 24 hours 4 hours   Tirofiban Aggrastat 8 hours 2 hours   Vitamin E  4 days    Warfarin Coumadin 5 days 2 hours    ____________________________________________________________________________________________

## 2018-09-14 ENCOUNTER — Encounter: Payer: Medicare Other | Admitting: Pain Medicine

## 2018-09-21 DIAGNOSIS — M5137 Other intervertebral disc degeneration, lumbosacral region: Secondary | ICD-10-CM | POA: Insufficient documentation

## 2018-09-21 DIAGNOSIS — M51379 Other intervertebral disc degeneration, lumbosacral region without mention of lumbar back pain or lower extremity pain: Secondary | ICD-10-CM | POA: Insufficient documentation

## 2018-09-21 NOTE — Progress Notes (Signed)
Patient's Name: Kenneth Osborn  MRN: [id number]  Referring Provider: Milinda Pointer, MD  DOB: [birth year]  PCP: [name], MD  DOS: 09/22/2018  Note by: Gaspar Cola, MD  Service setting: Ambulatory outpatient  Specialty: Interventional Pain Management  Patient type: Established  Location: [facility] (AMB) Pain Management Facility  Visit type: Interventional Procedure   Primary Reason for Visit: Interventional Pain Management Treatment. CC: Back Pain (lower)  Procedure:          Anesthesia, Analgesia, Anxiolysis:  Type: Lumbar Facet, Medial Branch Block(s) #2  Primary Purpose: Diagnostic Region: Posterolateral Lumbosacral Spine Level: L2, L3, L4, L5, & S1 Medial Branch Level(s). Injecting these levels blocks the L3-4, L4-5, and L5-S1 lumbar facet joints. Laterality: Bilateral  Type: Local Anesthesia Indication(s): Analgesia         Route: Infiltration ([place]/IM) IV Access: Declined Sedation: Declined  Local Anesthetic: Lidocaine 1-2%  Position: Prone   Indications: 1. Lumbar facet syndrome (Bilateral) (R>L)   2. Spondylosis without myelopathy or radiculopathy, lumbosacral region   3. Failed back surgical syndrome   4. Chronic low back pain (Bilateral) (L>R)   5. Lumbar spondylosis   6. DDD (degenerative disc disease), lumbosacral   7. Chronic anticoagulation (Effient)    Pain Score: Pre-procedure: 3 /10 Post-procedure: 0-No pain/10   Pre-op Assessment:  Kenneth Osborn is a 63 y.o. (year old), male patient, seen today for interventional treatment. He  has a past surgical history that includes Cardiovascular stress test; Cardiac catheterization; Coronary angioplasty; Labrinthectomy; mastoid shunt; Appendectomy; Back surgery; Shoulder arthroscopy with subacromial decompression (Left, 04/06/2012); ARTHRODESIS ANTERIOR ANTERIOR CERVICLE SPINE (01/04/2013); Colonoscopy with propofol (N/A, 09/19/2014); Esophagogastroduodenoscopy (N/A, 09/19/2014); Savory dilation (N/A, 09/19/2014); Spinal cord  stimulator implant (Right); Cardiac catheterization (N/A, 06/15/2015); and Cardiac catheterization (N/A, 06/15/2015). Kenneth Osborn has a current medication list which includes the following prescription(s): alprazolam, cetirizine, cyclobenzaprine, diazepam, diltiazem, enalapril, esomeprazole, ezetimibe, gabapentin, humulin r u-500 kwikpen, isosorbide mononitrate, magnesium oxide, meclizine, metformin, metoprolol succinate, montelukast, naloxone, niacin, nitroglycerin, fish oil, oxycodone hcl, oxycodone hcl, oxycodone hcl, rosuvastatin, aspirin ec, and prasugrel. His primarily concern today is the Back Pain (lower)  Initial Vital Signs:  Pulse/HCG Rate: 83ECG Heart Rate: 77 Temp: 98.3 F (36.8 C) Resp: 16 BP: (!) 144/86 SpO2: 97 %  BMI: Estimated body mass index is 32.28 kg/m as calculated from the following:   Height as of this encounter: 5' 9"  (1.753 m).   Weight as of this encounter: 218 lb 9.6 oz (99.2 kg).  Risk Assessment: Allergies: Reviewed. He has No Known Allergies.  Allergy Precautions: None required Coagulopathies: Reviewed. None identified.  Blood-thinner therapy: None at this time Active Infection(s): Reviewed. None identified. Kenneth Osborn is afebrile  Site Confirmation: Kenneth Osborn was asked to confirm the procedure and laterality before marking the site Procedure checklist: Completed Consent: Before the procedure and under the influence of no sedative(s), amnesic(s), or anxiolytics, the patient was informed of the treatment options, risks and possible complications. To fulfill our ethical and legal obligations, as recommended by the American Medical Association's Code of Ethics, I have informed the patient of my clinical impression; the nature and purpose of the treatment or procedure; the risks, benefits, and possible complications of the intervention; the alternatives, including doing nothing; the risk(s) and benefit(s) of the alternative treatment(s) or procedure(s); and the risk(s)  and benefit(s) of doing nothing. The patient was provided information about the general risks and possible complications associated with the procedure. These may include, but are not limited to: failure to achieve  desired goals, infection, bleeding, organ or nerve damage, allergic reactions, paralysis, and death. In addition, the patient was informed of those risks and complications associated to Spine-related procedures, such as failure to decrease pain; infection (i.e.: Meningitis, epidural or intraspinal abscess); bleeding (i.e.: epidural hematoma, subarachnoid hemorrhage, or any other type of intraspinal or peri-dural bleeding); organ or nerve damage (i.e.: Any type of peripheral nerve, nerve root, or spinal cord injury) with subsequent damage to sensory, motor, and/or autonomic systems, resulting in permanent pain, numbness, and/or weakness of one or several areas of the body; allergic reactions; (i.e.: anaphylactic reaction); and/or death. Furthermore, the patient was informed of those risks and complications associated with the medications. These include, but are not limited to: allergic reactions (i.e.: anaphylactic or anaphylactoid reaction(s)); adrenal axis suppression; blood sugar elevation that in diabetics may result in ketoacidosis or comma; water retention that in patients with history of congestive heart failure may result in shortness of breath, pulmonary edema, and decompensation with resultant heart failure; weight gain; swelling or edema; medication-induced neural toxicity; particulate matter embolism and blood vessel occlusion with resultant organ, and/or nervous system infarction; and/or aseptic necrosis of one or more joints. Finally, the patient was informed that Medicine is not an exact science; therefore, there is also the possibility of unforeseen or unpredictable risks and/or possible complications that may result in a catastrophic outcome. The patient indicated having understood very  clearly. We have given the patient no guarantees and we have made no promises. Enough time was given to the patient to ask questions, all of which were answered to the patient's satisfaction. Mr. Erhard has indicated that he wanted to continue with the procedure. Attestation: I, the ordering provider, attest that I have discussed with the patient the benefits, risks, side-effects, alternatives, likelihood of achieving goals, and potential problems during recovery for the procedure that I have provided informed consent. Date   Time: 09/22/2018  7:58 AM  Pre-Procedure Preparation:  Monitoring: As per clinic protocol. Respiration, ETCO2, SpO2, BP, heart rate and rhythm monitor placed and checked for adequate function Safety Precautions: Patient was assessed for positional comfort and pressure points before starting the procedure. Time-out: I initiated and conducted the "Time-out" before starting the procedure, as per protocol. The patient was asked to participate by confirming the accuracy of the "Time Out" information. Verification of the correct person, site, and procedure were performed and confirmed by me, the nursing staff, and the patient. "Time-out" conducted as per Joint Commission's Universal Protocol (UP.01.01.01). Time: 3235  Description of Procedure:          Laterality: Bilateral. The procedure was performed in identical fashion on both sides. Levels:  L2, L3, L4, L5, & S1 Medial Branch Level(s) Area Prepped: Posterior Lumbosacral Region Prepping solution: DuraPrep (Iodine Povacrylex [0.7% available iodine] and Isopropyl Alcohol, 74% w/w) Safety Precautions: Aspiration looking for blood return was conducted prior to all injections. At no point did we inject any substances, as a needle was being advanced. Before injecting, the patient was told to immediately notify me if he was experiencing any new onset of "ringing in the ears, or metallic taste in the mouth". No attempts were made at seeking  any paresthesias. Safe injection practices and needle disposal techniques used. Medications properly checked for expiration dates. SDV (single dose vial) medications used. After the completion of the procedure, all disposable equipment used was discarded in the proper designated medical waste containers. Local Anesthesia: Protocol guidelines were followed. The patient was positioned over the fluoroscopy table.  The area was prepped in the usual manner. The time-out was completed. The target area was identified using fluoroscopy. A 12-in long, straight, sterile hemostat was used with fluoroscopic guidance to locate the targets for each level blocked. Once located, the skin was marked with an approved surgical skin marker. Once all sites were marked, the skin (epidermis, dermis, and hypodermis), as well as deeper tissues (fat, connective tissue and muscle) were infiltrated with a small amount of a short-acting local anesthetic, loaded on a 10cc syringe with a 25G, 1.5-in  Needle. An appropriate amount of time was allowed for local anesthetics to take effect before proceeding to the next step. Local Anesthetic: Lidocaine 2.0% The unused portion of the local anesthetic was discarded in the proper designated containers. Technical explanation of process:  L2 Medial Branch Nerve Block (MBB): The target area for the L2 medial branch is at the junction of the postero-lateral aspect of the superior articular process and the superior, posterior, and medial edge of the transverse process of L3. Under fluoroscopic guidance, a Quincke needle was inserted until contact was made with os over the superior postero-lateral aspect of the pedicular shadow (target area). After negative aspiration for blood, 0.5 mL of the nerve block solution was injected without difficulty or complication. The needle was removed intact. L3 Medial Branch Nerve Block (MBB): The target area for the L3 medial branch is at the junction of the  postero-lateral aspect of the superior articular process and the superior, posterior, and medial edge of the transverse process of L4. Under fluoroscopic guidance, a Quincke needle was inserted until contact was made with os over the superior postero-lateral aspect of the pedicular shadow (target area). After negative aspiration for blood, 0.5 mL of the nerve block solution was injected without difficulty or complication. The needle was removed intact. L4 Medial Branch Nerve Block (MBB): The target area for the L4 medial branch is at the junction of the postero-lateral aspect of the superior articular process and the superior, posterior, and medial edge of the transverse process of L5. Under fluoroscopic guidance, a Quincke needle was inserted until contact was made with os over the superior postero-lateral aspect of the pedicular shadow (target area). After negative aspiration for blood, 0.5 mL of the nerve block solution was injected without difficulty or complication. The needle was removed intact. L5 Medial Branch Nerve Block (MBB): The target area for the L5 medial branch is at the junction of the postero-lateral aspect of the superior articular process and the superior, posterior, and medial edge of the sacral ala. Under fluoroscopic guidance, a Quincke needle was inserted until contact was made with os over the superior postero-lateral aspect of the pedicular shadow (target area). After negative aspiration for blood, 0.5 mL of the nerve block solution was injected without difficulty or complication. The needle was removed intact. S1 Medial Branch Nerve Block (MBB): The target area for the S1 medial branch is at the posterior and inferior 6 o'clock position of the L5-S1 facet joint. Under fluoroscopic guidance, the Quincke needle inserted for the L5 MBB was redirected until contact was made with os over the inferior and postero aspect of the sacrum, at the 6 o' clock position under the L5-S1 facet joint  (Target area). After negative aspiration for blood, 0.5 mL of the nerve block solution was injected without difficulty or complication. The needle was removed intact.  Nerve block solution: 0.2% PF-Ropivacaine + Triamcinolone (40 mg/mL) diluted to a final concentration of 4 mg of Triamcinolone/mL of  Ropivacaine The unused portion of the solution was discarded in the proper designated containers. Procedural Needles: 22-gauge, 3.5-inch, Quincke needles used for all levels.  Once the entire procedure was completed, the treated area was cleaned, making sure to leave some of the prepping solution back to take advantage of its long term bactericidal properties.   Illustration of the posterior view of the lumbar spine and the posterior neural structures. Laminae of L2 through S1 are labeled. DPRL5, dorsal primary ramus of L5; DPRS1, dorsal primary ramus of S1; DPR3, dorsal primary ramus of L3; FJ, facet (zygapophyseal) joint L3-L4; I, inferior articular process of L4; LB1, lateral branch of dorsal primary ramus of L1; IAB, inferior articular branches from L3 medial branch (supplies L4-L5 facet joint); IBP, intermediate branch plexus; MB3, medial branch of dorsal primary ramus of L3; NR3, third lumbar nerve root; S, superior articular process of L5; SAB, superior articular branches from L4 (supplies L4-5 facet joint also); TP3, transverse process of L3.  Vitals:   09/22/18 0827 09/22/18 0832 09/22/18 0837 09/22/18 0843  BP: (!) 141/79 140/77 133/82 124/81  Pulse:      Resp: 19 (!) 21 (!) 23 20  Temp:      SpO2: 92% 93% 92% 92%  Weight:      Height:         Start Time: 0832 hrs. End Time: 0841 hrs.  Imaging Guidance (Spinal):          Type of Imaging Technique: Fluoroscopy Guidance (Spinal) Indication(s): Assistance in needle guidance and placement for procedures requiring needle placement in or near specific anatomical locations not easily accessible without such assistance. Exposure Time: Please  see nurses notes. Contrast: None used. Fluoroscopic Guidance: I was personally present during the use of fluoroscopy. "Tunnel Vision Technique" used to obtain the best possible view of the target area. Parallax error corrected before commencing the procedure. "Direction-depth-direction" technique used to introduce the needle under continuous pulsed fluoroscopy. Once target was reached, antero-posterior, oblique, and lateral fluoroscopic projection used confirm needle placement in all planes. Images permanently stored in EMR. Interpretation: No contrast injected. I personally interpreted the imaging intraoperatively. Adequate needle placement confirmed in multiple planes. Permanent images saved into the patient's record.  Antibiotic Prophylaxis:   Anti-infectives (From admission, onward)   None     Indication(s): None identified  Post-operative Assessment:  Post-procedure Vital Signs:  Pulse/HCG Rate: 8378 Temp: 98.3 F (36.8 C) Resp: 20 BP: 124/81 SpO2: 92 %  EBL: None  Complications: No immediate post-treatment complications observed by team, or reported by patient.  Note: The patient tolerated the entire procedure well. A repeat set of vitals were taken after the procedure and the patient was kept under observation following institutional policy, for this type of procedure. Post-procedural neurological assessment was performed, showing return to baseline, prior to discharge. The patient was provided with post-procedure discharge instructions, including a section on how to identify potential problems. Should any problems arise concerning this procedure, the patient was given instructions to immediately contact us, at any time, without hesitation. In any case, we plan to contact the patient by telephone for a follow-up status report regarding this interventional procedure.  Comments:  No additional relevant information.  Plan of Care  Orders:  Orders Placed This Encounter  Procedures    LUMBAR FACET(MEDIAL BRANCH NERVE BLOCK) MBNB    Scheduling Instructions:     Side: Bilateral     Level: L3-4, L4-5, & L5-S1 Facets (L2, L3, L4, L5, & S1 Medial Branch  Nerves)     Sedation: With Sedation.     Timeframe: Today    Order Specific Question:   Where will this procedure be performed?    Answer:   [facility]   DG PAIN [facility] C-ARM 1-60 MIN NO REPORT    Intraoperative interpretation by procedural physician at [place].    Standing Status:   Standing    Number of Occurrences:   1    Order Specific Question:   Reason for exam:    Answer:   Assistance in needle guidance and placement for procedures requiring needle placement in or near specific anatomical locations not easily accessible without such assistance.   Care order/instruction: Please confirm that the patient has stopped the Effient (Prasugrel) x 10 days prior to procedure or surgery.    Please confirm that the patient has stopped the Effient (Prasugrel) x 10 days prior to procedure or surgery.    Standing Status:   Standing    Number of Occurrences:   1   Provider attestation of informed consent for procedure/surgical case    I, the ordering provider, attest that I have discussed with the patient the benefits, risks, side effects, alternatives, likelihood of achieving goals and potential problems during recovery for the procedure that I have provided informed consent.    Standing Status:   Standing    Number of Occurrences:   1   Informed Consent Details: Transcribe to consent form and obtain patient signature    Standing Status:   Standing    Number of Occurrences:   1    Order Specific Question:   Procedure    Answer:   Bilateral Lumbar facet block (medial branch block) under fluoroscopic guidance. (See notes for levels.)    Order Specific Question:   Surgeon    Answer:   Teyla Skidgel A. Dossie Arbour, MD    Order Specific Question:   Indication/Reason    Answer:   Bilateral low back pain with or  without lower extremity pain   Bleeding precautions    Standing Status:   Standing    Number of Occurrences:   1   Chronic Opioid Analgesic:  Oxycodone IR 10 mg, 1 tab PO q 6 hrs (40 mg/day of oxycodone) MME/day:60 mg/day.   Medications ordered for procedure: Meds ordered this encounter  Medications   lidocaine (XYLOCAINE) 2 % (with pres) injection 400 mg   DISCONTD: lactated ringers infusion 1,000 mL   DISCONTD: midazolam (VERSED) 5 MG/5ML injection 1-2 mg    Make sure Flumazenil is available in the pyxis when using this medication. If oversedation occurs, administer 0.2 mg IV over 15 sec. If after 45 sec no response, administer 0.2 mg again over 1 min; may repeat at 1 min intervals; not to exceed 4 doses (1 mg)   DISCONTD: fentaNYL (SUBLIMAZE) injection 25-50 mcg    Make sure Narcan is available in the pyxis when using this medication. In the event of respiratory depression (RR< 8/min): Titrate NARCAN (naloxone) in increments of 0.1 to 0.2 mg IV at 2-3 minute intervals, until desired degree of reversal.   ropivacaine (PF) 2 mg/mL (0.2%) (NAROPIN) injection 18 mL   triamcinolone acetonide (KENALOG-40) injection 80 mg   Medications administered: We administered lidocaine, ropivacaine (PF) 2 mg/mL (0.2%), and triamcinolone acetonide.  See the medical record for exact dosing, route, and time of administration.  Follow-up plan:   Return for (VV), 2 wk PP-F/U Eval.       Considering: NOTE: EFFIENT  Anticoagulation (Stop: 7-10 days; Re-start: 6 hrs) Possible right-sided lumbar facet RFA  Possible left-sided lumbar facet RFA    Palliative PRN treatment(s): Palliative right-sided PSIS MNB/TPI  Diagnostic/palliative right lumbar facet block #3  Diagnostic/palliative left lumbar facet block #2     Recent Visits Date Type Provider Dept  09/10/18 Office Visit Milinda Pointer, MD [facility]  08/25/18 Procedure visit Milinda Pointer, MD [facility]    Showing recent visits within past 90 days and meeting all other requirements   Today's Visits Date Type Provider Dept  09/22/18 Procedure visit Milinda Pointer, MD [facility]  Showing today's visits and meeting all other requirements   Future Appointments No visits were found meeting these conditions.  Showing future appointments within next 90 days and meeting all other requirements   Disposition: Discharge home  Discharge Date & Time: 09/22/2018; 0849 hrs.   Primary Care Physician: [name], MD Location: [facility] Note by: Gaspar Cola, MD Date: 09/22/2018; Time: 8:51 AM  Disclaimer:  Medicine is not an Chief Strategy Officer. The only guarantee in medicine is that nothing is guaranteed. It is important to note that the decision to proceed with this intervention was based on the information collected from the patient. The Data and conclusions were drawn from the patient's questionnaire, the interview, and the physical examination. Because the information was provided in large part by the patient, it cannot be guaranteed that it has not been purposely or unconsciously manipulated. Every effort has been made to obtain as much relevant data as possible for this evaluation. It is important to note that the conclusions that lead to this procedure are derived in large part from the available data. Always take into account that the treatment will also be dependent on availability of resources and existing treatment guidelines, considered by other Pain Management Practitioners as being common knowledge and practice, at the time of the intervention. For Medico-Legal purposes, it is also important to point out that variation in procedural techniques and pharmacological choices are the acceptable norm. The indications, contraindications, technique, and results of the above procedure should only be interpreted and judged by a Board-Certified Interventional  Pain Specialist with extensive familiarity and expertise in the same exact procedure and technique.

## 2018-09-21 NOTE — Patient Instructions (Signed)

## 2018-09-22 ENCOUNTER — Ambulatory Visit
Admission: RE | Admit: 2018-09-22 | Discharge: 2018-09-22 | Disposition: A | Discharge status: Home / Self Care | Payer: Medicare Other | Source: Ambulatory Visit | Attending: Pain Medicine | Admitting: Pain Medicine

## 2018-09-22 ENCOUNTER — Other Ambulatory Visit: Payer: Self-pay

## 2018-09-22 ENCOUNTER — Ambulatory Visit (HOSPITAL_BASED_OUTPATIENT_CLINIC_OR_DEPARTMENT_OTHER): Payer: Medicare Other | Admitting: Pain Medicine

## 2018-09-22 ENCOUNTER — Encounter: Payer: Self-pay | Admitting: Pain Medicine

## 2018-09-22 VITALS — BP 124/81 | HR 83 | Temp 98.3°F | Resp 20 | Ht 69.0 in | Wt 218.6 lb

## 2018-09-22 DIAGNOSIS — G8929 Other chronic pain: Secondary | ICD-10-CM | POA: Insufficient documentation

## 2018-09-22 DIAGNOSIS — M47817 Spondylosis without myelopathy or radiculopathy, lumbosacral region: Secondary | ICD-10-CM | POA: Diagnosis not present

## 2018-09-22 DIAGNOSIS — M47816 Spondylosis without myelopathy or radiculopathy, lumbar region: Secondary | ICD-10-CM | POA: Insufficient documentation

## 2018-09-22 DIAGNOSIS — M5442 Lumbago with sciatica, left side: Secondary | ICD-10-CM

## 2018-09-22 DIAGNOSIS — M5137 Other intervertebral disc degeneration, lumbosacral region: Secondary | ICD-10-CM | POA: Insufficient documentation

## 2018-09-22 DIAGNOSIS — M961 Postlaminectomy syndrome, not elsewhere classified: Secondary | ICD-10-CM | POA: Diagnosis present

## 2018-09-22 DIAGNOSIS — Z7901 Long term (current) use of anticoagulants: Secondary | ICD-10-CM | POA: Insufficient documentation

## 2018-09-22 MED ORDER — MIDAZOLAM HCL 5 MG/5ML IJ SOLN: 1.0000 mg | INTRAMUSCULAR | Status: DC | PRN

## 2018-09-22 MED ORDER — LACTATED RINGERS IV SOLN: 1000.0000 mL | Freq: Once | INTRAVENOUS | Status: DC

## 2018-09-22 MED ORDER — FENTANYL CITRATE (PF) 100 MCG/2ML IJ SOLN: 25.0000 ug | INTRAMUSCULAR | Status: DC | PRN

## 2018-09-22 MED ORDER — TRIAMCINOLONE ACETONIDE 40 MG/ML IJ SUSP
80.0000 mg | Freq: Once | INTRAMUSCULAR | Status: AC
  Administered 2018-09-22: 80 mg
  Filled 2018-09-22: qty 2

## 2018-09-22 MED ORDER — ROPIVACAINE HCL 2 MG/ML IJ SOLN
18.0000 mL | Freq: Once | INTRAMUSCULAR | Status: AC
  Administered 2018-09-22: 18 mL via PERINEURAL
  Filled 2018-09-22: qty 20

## 2018-09-22 MED ORDER — LIDOCAINE HCL 2 % IJ SOLN
20.0000 mL | Freq: Once | INTRAMUSCULAR | Status: AC
  Administered 2018-09-22: 400 mg
  Filled 2018-09-22: qty 40

## 2018-09-22 NOTE — Progress Notes (Signed)
Safety precautions to be maintained throughout the outpatient stay will include: orient to surroundings, keep bed in low position, maintain call bell within reach at all times, provide assistance with transfer out of bed and ambulation.  

## 2018-09-23 ENCOUNTER — Telehealth: Payer: Self-pay | Admitting: *Deleted

## 2018-09-23 ENCOUNTER — Ambulatory Visit: Payer: Medicare Other | Admitting: Pain Medicine

## 2018-09-23 NOTE — Telephone Encounter (Signed)
Denies any post procure issues. Does state one injection area is "sore" but not hot or red. Temp this am 97.4. Instructed to call us back  for any concerns.

## 2018-10-29 ENCOUNTER — Encounter: Payer: Self-pay | Admitting: Pain Medicine

## 2018-11-01 NOTE — Progress Notes (Signed)
Pain Management Virtual Encounter Note - Virtual Visit via Telephone Telehealth (real-time audio visits between healthcare provider and patient).   Patient's Phone No. & Preferred Pharmacy:  [phone] (home); [phone] (mobile); (Preferred) [phone] [email or web address]  [place], [place] - [address] [place] [address] Phone: [phone] Fax: [phone]    Pre-screening note:  Our staff contacted Kenneth Osborn and offered him an "in person", "face-to-face" appointment versus a telephone encounter. He indicated preferring the telephone encounter, at this time.   Reason for Virtual Visit: COVID-19*   Social distancing based on CDC and AMA recommendations.   I contacted Kenneth Osborn on 11/02/2018 via telephone.      I clearly identified myself as Gaspar Cola, MD. I verified that I was speaking with the correct person using two identifiers (Name: Kenneth Osborn, and date of birth: 09-30-1955).  Advanced Informed Consent I sought verbal advanced consent from Kenneth Osborn for virtual visit interactions. I informed Kenneth Osborn of possible security and privacy concerns, risks, and limitations associated with providing "not-in-person" medical evaluation and management services. I also informed Kenneth Osborn of the availability of "in-person" appointments. Finally, I informed him that there would be a charge for the virtual visit and that he could be  personally, fully or partially, financially responsible for it. Kenneth Osborn expressed understanding and agreed to proceed.   Historic Elements   Kenneth Osborn is a 63 y.o. year old, male patient evaluated today after his last encounter by our practice on 09/23/2018. Kenneth Osborn  has a past medical history of Allergic rhinitis (12/30/2012), Bronchitis, Can't get food down (08/12/2014), Chronic back pain, Concussion (09/2015), COPD (chronic obstructive pulmonary disease) ([place]), Coronary  artery disease, DDD (degenerative disc disease), cervical, Dehydration symptoms, Diabetes mellitus without complication ([place]), Dysphagia, GERD (gastroesophageal reflux disease), History of Meniere's disease (12/21/2014), History of thoracic spine surgery (S/P T9-10 IVD spacer) (12/21/2014), Hypercholesteremia, Hyperlipidemia, Hypertension, Meniere's disease, Myocardial infarction ([place]), Neuromuscular disorder ([place]), Psychosis ([place]), Short-segment Barrett's esophagus, and Sleep apnea. He also  has a past surgical history that includes Cardiovascular stress test; Cardiac catheterization; Coronary angioplasty; Labrinthectomy; mastoid shunt; Appendectomy; Back surgery; Shoulder arthroscopy with subacromial decompression (Left, 04/06/2012); ARTHRODESIS ANTERIOR ANTERIOR CERVICLE SPINE (01/04/2013); Colonoscopy with propofol (N/A, 09/19/2014); Esophagogastroduodenoscopy (N/A, 09/19/2014); Savory dilation (N/A, 09/19/2014); Spinal cord stimulator implant (Right); Cardiac catheterization (N/A, 06/15/2015); and Cardiac catheterization (N/A, 06/15/2015). Kenneth Osborn has a current medication list which includes the following prescription(s): alprazolam, aspirin ec, cetirizine, cyclobenzaprine, diazepam, diltiazem, enalapril, esomeprazole, ezetimibe, gabapentin, humulin r u-500 kwikpen, isosorbide mononitrate, magnesium oxide, meclizine, metformin, metoprolol succinate, montelukast, naloxone, niacin, nitroglycerin, fish oil, oxycodone hcl, oxycodone hcl, prasugrel, rosuvastatin, and oxycodone hcl. He  reports that he has never smoked. He has never used smokeless tobacco. He reports that he does not drink alcohol or use drugs. Kenneth Osborn has No Known Allergies.   HPI  Today, he is being contacted for a post-procedure assessment.  Although the patient indicates having done great after the diagnostic lumbar facet block, this seems to be slowly wearing off.  Today we have talked about the radiofrequency ablation and I have informed the  patient of the benefits as well as the risk and possible complications including a neuritis.  The patient also indicates that his spinal cord stimulator is not working and he asked if there was any problems with that and the procedure.  I informed the patient that we do radiofrequencies some patients with implants  without any major problems.  We just need to make arrangements for [employer] to be present after the procedure so that they can attempt removing his spinal cord stimulator again.  He indicates that it has not been working and that this is the second time that he needs to have it "jump started".  The patient was reminded that those devices typically will be permanently damaged, especially if this happens 3 times.  He indicates being aware of that.  He also indicates that he has not been providing him with much relief and he is actually considering having the device removed.  Today he had me explain the radiofrequency ablation procedure to him in detail, twice.  Once for him and the second time to his wife.  Post-Procedure Evaluation  Procedure: Diagnostic bilateral lumbar facet block #2 (#3 for right side) under fluoroscopic guidance, no sedation Pre-procedure pain level:  3/10 Post-procedure: 0/10 (100% relief)  Sedation: None.  Effectiveness during initial hour after procedure(Ultra-Short Term Relief): 100 %   Local anesthetic used: Long-acting (4-6 hours) Effectiveness: Defined as any analgesic benefit obtained secondary to the administration of local anesthetics. This carries significant diagnostic value as to the etiological location, or anatomical origin, of the pain. Duration of benefit is expected to coincide with the duration of the local anesthetic used.  Effectiveness during initial 4-6 hours after procedure(Short-Term Relief): 100 %   Long-term benefit: Defined as any relief past the pharmacologic duration of the local anesthetics.  Effectiveness past the initial 6 hours after  procedure(Long-Term Relief): 75 %(currently)   Current benefits: Defined as benefit that persist at this time.   Analgesia:  75% relief Function: Kenneth Osborn reports improvement in function ROM: Kenneth Osborn reports improvement in ROM  Medical Necessity: Kenneth Osborn has experienced debilitating chronic pain from the Lumbosacral Facet Syndrome (Spondylosis without myelopathy or radiculopathy, lumbosacral region [M47.817]) that has persisted for longer than three months of failed non-surgical care and has either failed to respond, or was unable to tolerate, or simply did not get enough benefit from other more conservative therapies including, but not limited to: 1. Over-the-counter oral analgesic medications (i.e.: ibuprofen, naproxen, etc.) 2. Anti-inflammatory medications 3. Muscle relaxants 4. Membrane stabilizers 5. Opioids 6. Physical therapy (PT), chiropractic manipulation, and/or home exercise program (HEP). 7. Modalities (Heat, ice, etc.) 8. Invasive techniques such as nerve blocks and/or surgery.  Kenneth Osborn has attained greater than 50% reduction in pain from at least two (2) diagnostic medial branch blocks conducted in separate occasions. For this reason, I believe it is medically necessary to proceed with Non-Pulsed Radiofrequency Ablation for the purpose of attempting to prolong the duration of the benefits seen with the diagnostic injections.  Pharmacotherapy Assessment  Analgesic: Oxycodone IR 10 mg, 1 tab PO q 6 hrs (40 mg/day of oxycodone) MME/day:60 mg/day.   Monitoring: Pharmacotherapy: No side-effects or adverse reactions reported. [place] PMP: PDMP reviewed during this encounter.       Compliance: No problems identified. Effectiveness: Clinically acceptable. Plan: Refer to "POC".  UDS:  Summary  Date Value Ref Range Status  03/18/2018 FINAL  Final    Comment:    ==================================================================== TOXASSURE SELECT 13  (MW) ==================================================================== Test                             Result       Flag       Units Drug Present and Declared for Prescription Verification   Desmethyldiazepam  123          EXPECTED   ng/mg creat   Oxazepam                       >897         EXPECTED   ng/mg creat   Temazepam                      291          EXPECTED   ng/mg creat    Desmethyldiazepam, oxazepam, and temazepam are expected    metabolites of diazepam. Desmethyldiazepam and oxazepam are also    expected metabolites of other drugs, including chlordiazepoxide,    prazepam, clorazepate, and halazepam. Oxazepam is an expected    metabolite of temazepam. Oxazepam and temazepam are also    available as scheduled prescription medications.   Oxycodone                      1956         EXPECTED   ng/mg creat   Oxymorphone                    4052         EXPECTED   ng/mg creat   Noroxycodone                   957          EXPECTED   ng/mg creat   Noroxymorphone                 485          EXPECTED   ng/mg creat    Sources of oxycodone are scheduled prescription medications.    Oxymorphone, noroxycodone, and noroxymorphone are expected    metabolites of oxycodone. Oxymorphone is also available as a    scheduled prescription medication. Drug Absent but Declared for Prescription Verification   Alprazolam                     Not Detected UNEXPECTED ng/mg creat ==================================================================== Test                      Result    Flag   Units      Ref Range   Creatinine              223              mg/dL      >=20 ==================================================================== Declared Medications:  The flagging and interpretation on this report are based on the  following declared medications.  Unexpected results may arise from  inaccuracies in the declared medications.  **Note: The testing scope of this panel includes these  medications:  Alprazolam (Xanax)  Diazepam (Valium)  Oxycodone  **Note: The testing scope of this panel does not include following  reported medications:  Aspirin  Cetirizine (Zyrtec)  Cyclobenzaprine (Flexeril)  Diltiazem (Cardizem)  Ezetimibe (Zetia)  Gabapentin (Neurontin)  Insulin (Humulin)  Isosorbide (Imdur)  Magnesium  Meclizine (Antivert)  Metformin (Glucophage)  Metoprolol (Toprol)  Montelukast (Singulair)  Naloxone (Narcan)  Niacin (Niaspan)  Nitroglycerin (Nitrostat)  Omega-3 Fatty Acids (Fish Oil)  Omeprazole (Nexium)  Prasugrel (Effient)  Rosuvastatin (Crestor) ==================================================================== For clinical consultation, please call [phone]. ====================================================================    Laboratory Chemistry Profile (12 mo)  Renal: 02/20/2018: BUN 34; Creatinine, Ser 1.49  Lab Results  Component Value Date   GFRAA 57 (L) 02/20/2018   GFRNONAA 49 (L) 02/20/2018   Hepatic: 01/18/2018: Albumin 4.1 Lab Results  Component Value Date   AST 31 01/18/2018   ALT 33 01/18/2018   Other: No results found for requested labs within last 8760 hours. Note: Above Lab results reviewed.  Imaging  Last 90 days:  Dg Pain Clinic C-arm 1-60 Min No Report  Result Date: 09/22/2018 Fluoro was used, but no Radiologist interpretation will be provided. Please refer to "NOTES" tab for provider progress note.  Dg Pain Clinic C-arm 1-60 Min No Report  Result Date: 08/25/2018 Fluoro was used, but no Radiologist interpretation will be provided. Please refer to "NOTES" tab for provider progress note.       Assessment  The primary encounter diagnosis was Chronic low back pain (Primary Area of Pain) (Bilateral) (L>R). Diagnoses of Lumbar facet syndrome (Bilateral) (R>L), Failed back surgical syndrome, Chronic pain syndrome, Chronic anticoagulation (Effient), [employer] spinal cord stimulator (implant date: 11/01/2014),  and Malfunction of spinal cord stimulator, sequela were also pertinent to this visit.  Plan of Care  I am having Kenneth Osborn maintain his diazepam, cetirizine, metoprolol succinate, prasugrel, esomeprazole, niacin, aspirin EC, ALPRAZolam, meclizine, Fish Oil, rosuvastatin, montelukast, ezetimibe, naloxone, HumuLIN R U-500 KwikPen, isosorbide mononitrate, nitroGLYCERIN, metFORMIN, enalapril, diltiazem, Oxycodone HCl, cyclobenzaprine, gabapentin, Magnesium Oxide, Oxycodone HCl, and Oxycodone HCl.  Pharmacotherapy (Medications Ordered): No orders of the defined types were placed in this encounter.  Orders:  Orders Placed This Encounter  Procedures   Radiofrequency,Lumbar    Standing Status:   Future    Standing Expiration Date:   04/30/2020    Scheduling Instructions:     Side(s): Left-sided     Level: L3-4, L4-5, & L5-S1 Facets (L2, L3, L4, L5, & S1 Medial Branch Nerves)     Sedation: With Sedation     Scheduling Timeframe: As soon as pre-approved    Order Specific Question:   Where will this procedure be performed?    Answer:   [facility]   Blood Thinner Instructions to Nursing    If the patient requires a Lovenox-bridge therapy, make sure arrangements are made to institute it with the assistance of the PCP.    Standing Status:   Standing    Number of Occurrences:   36    Standing Expiration Date:   05/01/2020    Scheduling Instructions:     Always stop the Effient (Prasugrel) x 10 days prior to procedure or surgery.   Follow-up plan:   Return for RFA-, Procedure (w/ sedation): (L) L-FCT RFA #1, (Blood-thinner Protocol).      Considering: NOTE: EFFIENT Anticoagulation (Stop: 7-10 days; Re-start: 6 hrs) Possible right-sided lumbar facet RFA  Possible left-sided lumbar facet RFA    Palliative PRN treatment(s): Palliative right-sided PSIS MNB/TPI  Diagnostic/palliative right lumbar facet block #3  Diagnostic/palliative left lumbar facet block #2      Recent  Visits Date Type Provider Dept  09/22/18 Procedure visit Milinda Pointer, MD [facility]  09/10/18 Office Visit Milinda Pointer, MD [facility]  08/25/18 Procedure visit Milinda Pointer, MD [facility]  Showing recent visits within past 90 days and meeting all other requirements   Today's Visits Date Type Provider Dept  11/02/18 Office Visit Milinda Pointer, MD [facility]  Showing today's visits and meeting all other requirements   Future Appointments Date Type Provider Dept  12/03/18 Appointment Milinda Pointer, [facility]  12/17/18 Appointment  Milinda Pointer, MD [facility]  12/23/18 Appointment Milinda Pointer, MD [facility]  Showing future appointments within next 90 days and meeting all other requirements   I discussed the assessment and treatment plan with the patient. The patient was provided an opportunity to ask questions and all were answered. The patient agreed with the plan and demonstrated an understanding of the instructions.  Patient advised to call back or seek an in-person evaluation if the symptoms or condition worsens.  Total duration of non-face-to-face encounter: 20 minutes.  Note by: Gaspar Cola, MD Date: 11/02/2018; Time: 10:24 AM  Note: This dictation was prepared with Dragon dictation. Any transcriptional errors that may result from this process are unintentional.  Disclaimer:  * Given the special circumstances of the COVID-19 pandemic, the federal government has announced that the Office for Civil Rights (OCR) will exercise its enforcement discretion and will not impose penalties on physicians using telehealth in the event of noncompliance with regulatory requirements under the [place] and [place] (HIPAA) in connection with the good faith provision of telehealth during the HYWVP-71 national public health emergency. ([place])

## 2018-11-02 ENCOUNTER — Ambulatory Visit: Payer: Medicare Other | Attending: Pain Medicine | Admitting: Pain Medicine

## 2018-11-02 ENCOUNTER — Other Ambulatory Visit: Payer: Self-pay

## 2018-11-02 DIAGNOSIS — Z7901 Long term (current) use of anticoagulants: Secondary | ICD-10-CM

## 2018-11-02 DIAGNOSIS — Z9689 Presence of other specified functional implants: Secondary | ICD-10-CM

## 2018-11-02 DIAGNOSIS — G894 Chronic pain syndrome: Secondary | ICD-10-CM | POA: Diagnosis not present

## 2018-11-02 DIAGNOSIS — Z9889 Other specified postprocedural states: Secondary | ICD-10-CM

## 2018-11-02 DIAGNOSIS — M47816 Spondylosis without myelopathy or radiculopathy, lumbar region: Secondary | ICD-10-CM | POA: Diagnosis not present

## 2018-11-02 DIAGNOSIS — M5442 Lumbago with sciatica, left side: Secondary | ICD-10-CM

## 2018-11-02 DIAGNOSIS — T85192S Other mechanical complication of implanted electronic neurostimulator (electrode) of spinal cord, sequela: Secondary | ICD-10-CM

## 2018-11-02 DIAGNOSIS — M961 Postlaminectomy syndrome, not elsewhere classified: Secondary | ICD-10-CM | POA: Diagnosis not present

## 2018-11-02 DIAGNOSIS — G8929 Other chronic pain: Secondary | ICD-10-CM

## 2018-11-02 NOTE — Patient Instructions (Signed)

## 2018-11-30 ENCOUNTER — Telehealth: Payer: Self-pay | Admitting: Pain Medicine

## 2018-11-30 NOTE — Telephone Encounter (Signed)
Patient is calling to make sure Kenneth Osborn will be here at his appt on thurs. Says his SCS battery is dead and they are supposed to help with that. I have put a call to [name] to see if this has been scheduled.

## 2018-12-02 NOTE — Patient Instructions (Addendum)
___________________________________________________________________________________________  Post-Radiofrequency (RF) Discharge Instructions  You have just completed a Radiofrequency Neurotomy.  The following instructions will provide you with information and guidelines for self-care upon discharge.  If at any time you have questions or concerns please call your physician. DO NOT DRIVE YOURSELF!!  Instructions:  Apply ice: Fill a plastic sandwich bag with crushed ice. Cover it with a small towel and apply to injection site. Apply for 15 minutes then remove x 15 minutes. Repeat sequence on day of procedure, until you go to bed. The purpose is to minimize swelling and discomfort after procedure.  Apply heat: Apply heat to procedure site starting the day following the procedure. The purpose is to treat any soreness and discomfort from the procedure.  Food intake: No eating limitations, unless stipulated above.  Nevertheless, if you have had sedation, you may experience some nausea.  In this case, it may be wise to wait at least two hours prior to resuming regular diet.  Physical activities: Keep activities to a minimum for the first 8 hours after the procedure. For the first 24 hours after the procedure, do not drive a motor vehicle,  Operate heavy machinery, power tools, or handle any weapons.  Consider walking with the use of an assistive device or accompanied by an adult for the first 24 hours.  Do not drink alcoholic beverages including beer.  Do not make any important decisions or sign any legal documents. Go home and rest today.  Resume activities tomorrow, as tolerated.  Use caution in moving about as you may experience mild leg weakness.  Use caution in cooking, use of household electrical appliances and climbing steps.  Driving: If you have received any sedation, you are not allowed to drive for 24 hours after your procedure.  Blood thinner: Restart your blood thinner 6 hours after your  procedure. (Only for those taking blood thinners)  Insulin: As soon as you can eat, you may resume your normal dosing schedule. (Only for those taking insulin)  Medications: May resume pre-procedure medications.  Do not take any drugs, other than what has been prescribed to you.  Infection prevention: Keep procedure site clean and dry.  Post-procedure Pain Diary: Extremely important that this be done correctly and accurately. Recorded information will be used to determine the next step in treatment.  Pain evaluated is that of treated area only. Do not include pain from an untreated area.  Complete every hour, on the hour, for the initial 8 hours. Set an alarm to help you do this part accurately.  Do not go to sleep and have it completed later. It will not be accurate.  Follow-up appointment: Keep your follow-up appointment after the procedure. Usually 2 weeks for most procedures. (6 weeks in the case of radiofrequency.) Bring you pain diary.   Expect:  From numbing medicine (AKA: Local Anesthetics): Numbness or decrease in pain.  Onset: Full effect within 15 minutes of injected.  Duration: It will depend on the type of local anesthetic used. On the average, 1 to 8 hours.   From steroids: Decrease in swelling or inflammation. Once inflammation is improved, relief of the pain will follow.  Onset of benefits: Depends on the amount of swelling present. The more swelling, the longer it will take for the benefits to be seen. In some cases, up to 10 days.  Duration: Steroids will stay in the system x 2 weeks. Duration of benefits will depend on multiple posibilities including persistent irritating factors.  From procedure: Some  discomfort is to be expected once the numbing medicine wears off. This should be minimal if ice and heat are applied as instructed.  Call if:  You experience numbness and weakness that gets worse with time, as opposed to wearing off.  He experience any unusual  bleeding, difficulty breathing, or loss of the ability to control your bowel and bladder. (This applies to Spinal procedures only)  You experience any redness, swelling, heat, red streaks, elevated temperature, fever, or any other signs of a possible infection.  Emergency Numbers:  [place] business hours (Monday - Thursday, 8:00 AM - 4:00 PM) (Friday, 9:00 AM - 12:00 Noon): [phone]  After hours: [phone] ____________________________________________________________________________________________  Prescriptions for Flexeril, Magnesium, and Gabapentin were sent to [facility]. Three prescriptions for Oxycodone and two for Hydrocodone were sent to your local pharmacy.

## 2018-12-02 NOTE — Progress Notes (Signed)
Patient's Name: Kenneth Osborn  MRN: [id number]  Referring Provider: [name], MD  DOB: [birth year]  PCP: [name], MD  DOS: 12/03/2018  Note by: Gaspar Cola, MD  Service setting: Ambulatory outpatient  Specialty: Interventional Pain Management  Patient type: Established  Location: [facility] (AMB) Pain Management Facility  Visit type: Interventional Procedure   Primary Reason for Visit: Interventional Pain Management Treatment. CC: Back Pain (lumbar bilateral )  Procedure:          Anesthesia, Analgesia, Anxiolysis:  Type: Thermal Lumbar Facet, Medial Branch Radiofrequency Ablation/Neurotomy  #1  Primary Purpose: Therapeutic Region: Posterolateral Lumbosacral Spine Level: L2, L3, L4, L5, & S1 Medial Branch Level(s). These levels will denervate the L3-4, L4-5, and the L5-S1 lumbar facet joints. Laterality: Left  Type: Moderate (Conscious) Sedation combined with Local Anesthesia Indication(s): Analgesia and Anxiety Route: Intravenous (IV) IV Access: Secured Sedation: Meaningful verbal contact was maintained at all times during the procedure  Local Anesthetic: Lidocaine 1-2%  Position: Prone   Indications: 1. Lumbar facet syndrome (Bilateral) (R>L)   2. Spondylosis without myelopathy or radiculopathy, lumbosacral region   3. Lumbar spondylosis   4. Failed back surgical syndrome   5. DDD (degenerative disc disease), lumbosacral   6. Chronic low back pain (Primary Area of Pain) (Bilateral) (L>R)    Kenneth Osborn has been dealing with the above chronic pain for longer than three months and has either failed to respond, was unable to tolerate, or simply did not get enough benefit from other more conservative therapies including, but not limited to: 1. Over-the-counter medications 2. Anti-inflammatory medications 3. Muscle relaxants 4. Membrane stabilizers 5. Opioids 6. Physical therapy and/or chiropractic manipulation 7. Modalities (Heat, ice, etc.) 8. Invasive techniques  such as nerve blocks. Kenneth Osborn has attained more than 50% relief of the pain from a series of diagnostic injections conducted in separate occasions.  Pain Score: Pre-procedure: 2 /10 Post-procedure: 0-No pain/10  Pharmacotherapy Assessment  Analgesic: Oxycodone IR 10 mg, 1 tab PO q 6 hrs (40 mg/day of oxycodone) MME/day:60 mg/day.   Monitoring: Pharmacotherapy: No side-effects or adverse reactions reported. [place] PMP: PDMP reviewed during this encounter.       Compliance: No problems identified. Effectiveness: Clinically acceptable. Plan: Refer to "POC".  UDS:  Summary  Date Value Ref Range Status  03/18/2018 FINAL  Final    Comment:    ==================================================================== TOXASSURE SELECT 13 (MW) ==================================================================== Test                             Result       Flag       Units Drug Present and Declared for Prescription Verification   Desmethyldiazepam              123          EXPECTED   ng/mg creat   Oxazepam                       >897         EXPECTED   ng/mg creat   Temazepam                      291          EXPECTED   ng/mg creat    Desmethyldiazepam, oxazepam, and temazepam are expected    metabolites of diazepam. Desmethyldiazepam and oxazepam are also    expected metabolites of  other drugs, including chlordiazepoxide,    prazepam, clorazepate, and halazepam. Oxazepam is an expected    metabolite of temazepam. Oxazepam and temazepam are also    available as scheduled prescription medications.   Oxycodone                      1956         EXPECTED   ng/mg creat   Oxymorphone                    4052         EXPECTED   ng/mg creat   Noroxycodone                   957          EXPECTED   ng/mg creat   Noroxymorphone                 485          EXPECTED   ng/mg creat    Sources of oxycodone are scheduled prescription medications.    Oxymorphone, noroxycodone, and noroxymorphone are  expected    metabolites of oxycodone. Oxymorphone is also available as a    scheduled prescription medication. Drug Absent but Declared for Prescription Verification   Alprazolam                     Not Detected UNEXPECTED ng/mg creat ==================================================================== Test                      Result    Flag   Units      Ref Range   Creatinine              223              mg/dL      >=20 ==================================================================== Declared Medications:  The flagging and interpretation on this report are based on the  following declared medications.  Unexpected results may arise from  inaccuracies in the declared medications.  **Note: The testing scope of this panel includes these medications:  Alprazolam (Xanax)  Diazepam (Valium)  Oxycodone  **Note: The testing scope of this panel does not include following  reported medications:  Aspirin  Cetirizine (Zyrtec)  Cyclobenzaprine (Flexeril)  Diltiazem (Cardizem)  Ezetimibe (Zetia)  Gabapentin (Neurontin)  Insulin (Humulin)  Isosorbide (Imdur)  Magnesium  Meclizine (Antivert)  Metformin (Glucophage)  Metoprolol (Toprol)  Montelukast (Singulair)  Naloxone (Narcan)  Niacin (Niaspan)  Nitroglycerin (Nitrostat)  Omega-3 Fatty Acids (Fish Oil)  Omeprazole (Nexium)  Prasugrel (Effient)  Rosuvastatin (Crestor) ==================================================================== For clinical consultation, please call [phone]. ====================================================================    Pre-op Assessment:  Kenneth Osborn is a 63 y.o. (year old), male patient, seen today for interventional treatment. He  has a past surgical history that includes Cardiovascular stress test; Cardiac catheterization; Coronary angioplasty; Labrinthectomy; mastoid shunt; Appendectomy; Back surgery; Shoulder arthroscopy with subacromial decompression (Left, 04/06/2012); ARTHRODESIS  ANTERIOR ANTERIOR CERVICLE SPINE (01/04/2013); Colonoscopy with propofol (N/A, 09/19/2014); Esophagogastroduodenoscopy (N/A, 09/19/2014); Savory dilation (N/A, 09/19/2014); Spinal cord stimulator implant (Right); Cardiac catheterization (N/A, 06/15/2015); and Cardiac catheterization (N/A, 06/15/2015). Mr. Feltus has a current medication list which includes the following prescription(s): alprazolam, alprazolam, aspirin ec, cetirizine, cyclobenzaprine, diazepam, diltiazem, enalapril, esomeprazole, ezetimibe, gabapentin, humulin r u-500 kwikpen, isosorbide mononitrate, magnesium oxide, meclizine, metformin, metoprolol succinate, montelukast, naloxone, niacin, nitroglycerin, fish oil, oxycodone hcl, oxycodone hcl, oxycodone hcl, oxycodone hcl, prasugrel, rosuvastatin, hydrocodone-acetaminophen, and hydrocodone-acetaminophen. His  primarily concern today is the Back Pain (lumbar bilateral )  Initial Vital Signs:  Pulse/HCG Rate: 83ECG Heart Rate: 84 Temp: 98.3 F (36.8 C) Resp: 16 BP: 129/83 SpO2: 97 %  BMI: Estimated body mass index is 33.23 kg/m as calculated from the following:   Height as of this encounter: 5' 9"  (1.753 m).   Weight as of this encounter: 225 lb (102.1 kg).  Risk Assessment: Allergies: Reviewed. He has No Known Allergies.  Allergy Precautions: None required Coagulopathies: Reviewed. None identified.  Blood-thinner therapy: None at this time Active Infection(s): Reviewed. None identified. Mr. Lincks is afebrile  Site Confirmation: Mr. Schreier was asked to confirm the procedure and laterality before marking the site Procedure checklist: Completed Consent: Before the procedure and under the influence of no sedative(s), amnesic(s), or anxiolytics, the patient was informed of the treatment options, risks and possible complications. To fulfill our ethical and legal obligations, as recommended by the American Medical Association's Code of Ethics, I have informed the patient of my clinical  impression; the nature and purpose of the treatment or procedure; the risks, benefits, and possible complications of the intervention; the alternatives, including doing nothing; the risk(s) and benefit(s) of the alternative treatment(s) or procedure(s); and the risk(s) and benefit(s) of doing nothing. The patient was provided information about the general risks and possible complications associated with the procedure. These may include, but are not limited to: failure to achieve desired goals, infection, bleeding, organ or nerve damage, allergic reactions, paralysis, and death. In addition, the patient was informed of those risks and complications associated to Spine-related procedures, such as failure to decrease pain; infection (i.e.: Meningitis, epidural or intraspinal abscess); bleeding (i.e.: epidural hematoma, subarachnoid hemorrhage, or any other type of intraspinal or peri-dural bleeding); organ or nerve damage (i.e.: Any type of peripheral nerve, nerve root, or spinal cord injury) with subsequent damage to sensory, motor, and/or autonomic systems, resulting in permanent pain, numbness, and/or weakness of one or several areas of the body; allergic reactions; (i.e.: anaphylactic reaction); and/or death. Furthermore, the patient was informed of those risks and complications associated with the medications. These include, but are not limited to: allergic reactions (i.e.: anaphylactic or anaphylactoid reaction(s)); adrenal axis suppression; blood sugar elevation that in diabetics may result in ketoacidosis or comma; water retention that in patients with history of congestive heart failure may result in shortness of breath, pulmonary edema, and decompensation with resultant heart failure; weight gain; swelling or edema; medication-induced neural toxicity; particulate matter embolism and blood vessel occlusion with resultant organ, and/or nervous system infarction; and/or aseptic necrosis of one or more  joints. Finally, the patient was informed that Medicine is not an exact science; therefore, there is also the possibility of unforeseen or unpredictable risks and/or possible complications that may result in a catastrophic outcome. The patient indicated having understood very clearly. We have given the patient no guarantees and we have made no promises. Enough time was given to the patient to ask questions, all of which were answered to the patient's satisfaction. Mr. Raz has indicated that he wanted to continue with the procedure. Attestation: I, the ordering provider, attest that I have discussed with the patient the benefits, risks, side-effects, alternatives, likelihood of achieving goals, and potential problems during recovery for the procedure that I have provided informed consent. Date   Time: 12/03/2018 10:17 AM  Pre-Procedure Preparation:  Monitoring: As per clinic protocol. Respiration, ETCO2, SpO2, BP, heart rate and rhythm monitor placed and checked for adequate function Safety Precautions:  Patient was assessed for positional comfort and pressure points before starting the procedure. Time-out: I initiated and conducted the "Time-out" before starting the procedure, as per protocol. The patient was asked to participate by confirming the accuracy of the "Time Out" information. Verification of the correct person, site, and procedure were performed and confirmed by me, the nursing staff, and the patient. "Time-out" conducted as per Joint Commission's Universal Protocol (UP.01.01.01). Time: 1138  Description of Procedure:          Laterality: Left Levels:  L2, L3, L4, L5, & S1 Medial Branch Level(s), at the L3-4, L4-5, and the L5-S1 lumbar facet joints. Area Prepped: Lumbosacral Prepping solution: DuraPrep (Iodine Povacrylex [0.7% available iodine] and Isopropyl Alcohol, 74% w/w) Safety Precautions: Aspiration looking for blood return was conducted prior to all injections. At no point did we  inject any substances, as a needle was being advanced. Before injecting, the patient was told to immediately notify me if he was experiencing any new onset of "ringing in the ears, or metallic taste in the mouth". No attempts were made at seeking any paresthesias. Safe injection practices and needle disposal techniques used. Medications properly checked for expiration dates. SDV (single dose vial) medications used. After the completion of the procedure, all disposable equipment used was discarded in the proper designated medical waste containers. Local Anesthesia: Protocol guidelines were followed. The patient was positioned over the fluoroscopy table. The area was prepped in the usual manner. The time-out was completed. The target area was identified using fluoroscopy. A 12-in long, straight, sterile hemostat was used with fluoroscopic guidance to locate the targets for each level blocked. Once located, the skin was marked with an approved surgical skin marker. Once all sites were marked, the skin (epidermis, dermis, and hypodermis), as well as deeper tissues (fat, connective tissue and muscle) were infiltrated with a small amount of a short-acting local anesthetic, loaded on a 10cc syringe with a 25G, 1.5-in  Needle. An appropriate amount of time was allowed for local anesthetics to take effect before proceeding to the next step. Local Anesthetic: Lidocaine 2.0% The unused portion of the local anesthetic was discarded in the proper designated containers. Technical explanation of process:  Radiofrequency Ablation (RFA) L2 Medial Branch Nerve RFA: The target area for the L2 medial branch is at the junction of the postero-lateral aspect of the superior articular process and the superior, posterior, and medial edge of the transverse process of L3. Under fluoroscopic guidance, a Radiofrequency needle was inserted until contact was made with os over the superior postero-lateral aspect of the pedicular shadow  (target area). Sensory and motor testing was conducted to properly adjust the position of the needle. Once satisfactory placement of the needle was achieved, the numbing solution was slowly injected after negative aspiration for blood. 2.0 mL of the nerve block solution was injected without difficulty or complication. After waiting for at least 3 minutes, the ablation was performed. Once completed, the needle was removed intact. L3 Medial Branch Nerve RFA: The target area for the L3 medial branch is at the junction of the postero-lateral aspect of the superior articular process and the superior, posterior, and medial edge of the transverse process of L4. Under fluoroscopic guidance, a Radiofrequency needle was inserted until contact was made with os over the superior postero-lateral aspect of the pedicular shadow (target area). Sensory and motor testing was conducted to properly adjust the position of the needle. Once satisfactory placement of the needle was achieved, the numbing solution was slowly  injected after negative aspiration for blood. 2.0 mL of the nerve block solution was injected without difficulty or complication. After waiting for at least 3 minutes, the ablation was performed. Once completed, the needle was removed intact. L4 Medial Branch Nerve RFA: The target area for the L4 medial branch is at the junction of the postero-lateral aspect of the superior articular process and the superior, posterior, and medial edge of the transverse process of L5. Under fluoroscopic guidance, a Radiofrequency needle was inserted until contact was made with os over the superior postero-lateral aspect of the pedicular shadow (target area). Sensory and motor testing was conducted to properly adjust the position of the needle. Once satisfactory placement of the needle was achieved, the numbing solution was slowly injected after negative aspiration for blood. 2.0 mL of the nerve block solution was injected without  difficulty or complication. After waiting for at least 3 minutes, the ablation was performed. Once completed, the needle was removed intact. L5 Medial Branch Nerve RFA: The target area for the L5 medial branch is at the junction of the postero-lateral aspect of the superior articular process of S1 and the superior, posterior, and medial edge of the sacral ala. Under fluoroscopic guidance, a Radiofrequency needle was inserted until contact was made with os over the superior postero-lateral aspect of the pedicular shadow (target area). Sensory and motor testing was conducted to properly adjust the position of the needle. Once satisfactory placement of the needle was achieved, the numbing solution was slowly injected after negative aspiration for blood. 2.0 mL of the nerve block solution was injected without difficulty or complication. After waiting for at least 3 minutes, the ablation was performed. Once completed, the needle was removed intact. S1 Medial Branch Nerve RFA: The target area for the S1 medial branch is located inferior to the junction of the S1 superior articular process and the L5 inferior articular process, posterior, inferior, and lateral to the 6 o'clock position of the L5-S1 facet joint, just superior to the S1 posterior foramen. Under fluoroscopic guidance, the Radiofrequency needle was advanced until contact was made with os over the Target area. Sensory and motor testing was conducted to properly adjust the position of the needle. Once satisfactory placement of the needle was achieved, the numbing solution was slowly injected after negative aspiration for blood. 2.0 mL of the nerve block solution was injected without difficulty or complication. After waiting for at least 3 minutes, the ablation was performed. Once completed, the needle was removed intact. Radiofrequency lesioning (ablation):  Radiofrequency Generator: NeuroTherm [id number] Sensory Stimulation Parameters: 50 Hz was used to locate  & identify the nerve, making sure that the needle was positioned such that there was no sensory stimulation below 0.3 V or above 0.7 V. Motor Stimulation Parameters: 2 Hz was used to evaluate the motor component. Care was taken not to lesion any nerves that demonstrated motor stimulation of the lower extremities at an output of less than 2.5 times that of the sensory threshold, or a maximum of 2.0 V. Lesioning Technique Parameters: Standard Radiofrequency settings. (Not bipolar or pulsed.) Temperature Settings: 80 degrees C Lesioning time: 60 seconds Intra-operative Compliance: Compliant Materials & Medications: Needle(s) (Electrode/Cannula) Type: Teflon-coated, curved tip, Radiofrequency needle(s) Gauge: 22G Length: 10cm Numbing solution: 0.2% PF-Ropivacaine + Triamcinolone (40 mg/mL) diluted to a final concentration of 4 mg of Triamcinolone/mL of Ropivacaine The unused portion of the solution was discarded in the proper designated containers.  Once the entire procedure was completed, the treated area was cleaned,  making sure to leave some of the prepping solution back to take advantage of its long term bactericidal properties.  Illustration of the posterior view of the lumbar spine and the posterior neural structures. Laminae of L2 through S1 are labeled. DPRL5, dorsal primary ramus of L5; DPRS1, dorsal primary ramus of S1; DPR3, dorsal primary ramus of L3; FJ, facet (zygapophyseal) joint L3-L4; I, inferior articular process of L4; LB1, lateral branch of dorsal primary ramus of L1; IAB, inferior articular branches from L3 medial branch (supplies L4-L5 facet joint); IBP, intermediate branch plexus; MB3, medial branch of dorsal primary ramus of L3; NR3, third lumbar nerve root; S, superior articular process of L5; SAB, superior articular branches from L4 (supplies L4-5 facet joint also); TP3, transverse process of L3.  Vitals:   12/03/18 1210 12/03/18 1220 12/03/18 1230 12/03/18 1238  BP:  122/69  128/71 120/78  Pulse:      Resp: 13 17 14 19   Temp:  98.4 F (36.9 C)    TempSrc:      SpO2: 94% 95% 96% 98%  Weight:      Height:        Start Time: 1138 hrs. End Time: 1209 hrs.  Imaging Guidance (Spinal):          Type of Imaging Technique: Fluoroscopy Guidance (Spinal) Indication(s): Assistance in needle guidance and placement for procedures requiring needle placement in or near specific anatomical locations not easily accessible without such assistance. Exposure Time: Please see nurses notes. Contrast: None used. Fluoroscopic Guidance: I was personally present during the use of fluoroscopy. "Tunnel Vision Technique" used to obtain the best possible view of the target area. Parallax error corrected before commencing the procedure. "Direction-depth-direction" technique used to introduce the needle under continuous pulsed fluoroscopy. Once target was reached, antero-posterior, oblique, and lateral fluoroscopic projection used confirm needle placement in all planes. Images permanently stored in EMR. Interpretation: No contrast injected. I personally interpreted the imaging intraoperatively. Adequate needle placement confirmed in multiple planes. Permanent images saved into the patient's record.  Antibiotic Prophylaxis:   Anti-infectives (From admission, onward)   None     Indication(s): None identified  Post-operative Assessment:  Post-procedure Vital Signs:  Pulse/HCG Rate: 8383 Temp: 98.4 F (36.9 C) Resp: 19 BP: 120/78 SpO2: 98 %  EBL: None  Complications: No immediate post-treatment complications observed by team, or reported by patient.  Note: The patient tolerated the entire procedure well. A repeat set of vitals were taken after the procedure and the patient was kept under observation following institutional policy, for this type of procedure. Post-procedural neurological assessment was performed, showing return to baseline, prior to discharge. The patient was  provided with post-procedure discharge instructions, including a section on how to identify potential problems. Should any problems arise concerning this procedure, the patient was given instructions to immediately contact us, at any time, without hesitation. In any case, we plan to contact the patient by telephone for a follow-up status report regarding this interventional procedure.  Comments:  No additional relevant information.  Plan of Care  Orders:  Orders Placed This Encounter  Procedures   Radiofrequency,Lumbar    Scheduling Instructions:     Side(s): Left-sided     Level: L3-4, L4-5, & L5-S1 Facets (L2, L3, L4, L5, & S1 Medial Branch Nerves)     Sedation: With Sedation     Timeframe: Today    Order Specific Question:   Where will this procedure be performed?    Answer:   [facility]  Radiofrequency,Lumbar    Standing Status:   Future    Standing Expiration Date:   06/01/2020    Scheduling Instructions:     Side(s): Right-sided     Level: L3-4, L4-5, & L5-S1 Facets (L2, L3, L4, L5, & S1 Medial Branch Nerves)     Sedation: With Sedation     Scheduling Timeframe: 2 weeks from now    Order Specific Question:   Where will this procedure be performed?    Answer:   [facility]   DG PAIN CLINIC C-ARM 1-60 MIN NO REPORT    Intraoperative interpretation by procedural physician at [place].    Standing Status:   Standing    Number of Occurrences:   1    Order Specific Question:   Reason for exam:    Answer:   Assistance in needle guidance and placement for procedures requiring needle placement in or near specific anatomical locations not easily accessible without such assistance.   Informed Consent Details: Physician/Practitioner Attestation; Transcribe to consent form and obtain patient signature    Provider Attestation: I, [place] Dossie Arbour, MD, (Pain Management Specialist), the physician/practitioner, attest that I have discussed with the  patient the benefits, risks, side effects, alternatives, likelihood of achieving goals and potential problems during recovery for the procedure that I have provided informed consent.    Scheduling Instructions:     Procedure: Lumbar Facet Radiofrequency Ablation     Indication/Reason: Low Back Pain, with our without leg pain, due to Facet Joint Arthralgia (Joint Pain) known as Lumbar Facet Syndrome, secondary to Lumbar, and/or Lumbosacral Spondylosis (Arthritis of the Spine), without myelopathy or radiculopathy (Nerve Damage).     Note: Always confirm laterality of pain with Mr. Robling, before procedure.     Transcribe to consent form and obtain patient signature.   Provide equipment / supplies at bedside    Equipment required: Single use, disposable, "Block Tray"    Standing Status:   Standing    Number of Occurrences:   1    Order Specific Question:   Specify    Answer:   [place] order/instruction: Please confirm that the patient has stopped the Effient (Prasugrel) x 10 days prior to procedure or surgery.    Please confirm that the patient has stopped the Effient (Prasugrel) x 10 days prior to procedure or surgery.    Standing Status:   Standing    Number of Occurrences:   1   Bleeding precautions    Standing Status:   Standing    Number of Occurrences:   1   Chronic Opioid Analgesic:  Oxycodone IR 10 mg, 1 tab PO q 6 hrs (40 mg/day of oxycodone) MME/day:60 mg/day.   Medications ordered for procedure: Meds ordered this encounter  Medications   lidocaine (XYLOCAINE) 2 % (with pres) injection 400 mg   lactated ringers infusion 1,000 mL   midazolam (VERSED) 5 MG/5ML injection 1-2 mg    Make sure Flumazenil is available in the pyxis when using this medication. If oversedation occurs, administer 0.2 mg IV over 15 sec. If after 45 sec no response, administer 0.2 mg again over 1 min; may repeat at 1 min intervals; not to exceed 4 doses (1 mg)   fentaNYL (SUBLIMAZE) injection  25-50 mcg    Make sure Narcan is available in the pyxis when using this medication. In the event of respiratory depression (RR< 8/min): Titrate NARCAN (naloxone) in increments of 0.1 to 0.2 mg IV at  2-3 minute intervals, until desired degree of reversal.   triamcinolone acetonide (KENALOG-40) injection 40 mg   ropivacaine (PF) 2 mg/mL (0.2%) (NAROPIN) injection 9 mL   Oxycodone HCl 10 MG TABS    Sig: Take 1 tablet (10 mg total) by mouth every 6 (six) hours as needed. Must last 30 days    Dispense:  120 tablet    Refill:  0    Chronic Pain: STOP Act (Not applicable) Fill 1 day early if closed on refill date. Do not fill until: 01/08/2019. To last until: 02/07/2019. Avoid benzodiazepines within 8 hours of opioids   Oxycodone HCl 10 MG TABS    Sig: Take 1 tablet (10 mg total) by mouth every 6 (six) hours as needed. Must last 30 days    Dispense:  120 tablet    Refill:  0    Chronic Pain: STOP Act (Not applicable) Fill 1 day early if closed on refill date. Do not fill until: 02/07/2019. To last until: 03/09/2019. Avoid benzodiazepines within 8 hours of opioids   Oxycodone HCl 10 MG TABS    Sig: Take 1 tablet (10 mg total) by mouth every 6 (six) hours as needed. Must last 30 days    Dispense:  120 tablet    Refill:  0    Chronic Pain: STOP Act (Not applicable) Fill 1 day early if closed on refill date. Do not fill until: 03/09/2019. To last until: 04/08/2019. Avoid benzodiazepines within 8 hours of opioids   HYDROcodone-acetaminophen (NORCO/VICODIN) 5-325 MG tablet    Sig: Take 1 tablet by mouth every 8 (eight) hours as needed for up to 7 days for severe pain. Must last 7 days.    Dispense:  21 tablet    Refill:  0    For acute post-operative pain. Not to be refilled. Must last 7 days.   HYDROcodone-acetaminophen (NORCO/VICODIN) 5-325 MG tablet    Sig: Take 1 tablet by mouth every 8 (eight) hours as needed for up to 7 days for severe pain. Must last for 7 days.    Dispense:  21 tablet     Refill:  0    For acute post-operative pain. Not to be refilled. Must last 7 days.   Medications administered: We administered lidocaine, lactated ringers, midazolam, fentaNYL, triamcinolone acetonide, and ropivacaine (PF) 2 mg/mL (0.2%).  See the medical record for exact dosing, route, and time of administration.  Follow-up plan:   Return in about 2 weeks (around 12/17/2018) for RFA (w/ sedation): (R) L-FCT RFA #1.       Considering: NOTE: EFFIENT Anticoagulation (Stop: 7-10 days   Re-start: 6 hrs) Therapeutic right-sided lumbar facet RFA #1 (Next)   Palliative PRN treatment(s): Palliative right PSIS MNB/TPI  Diagnostic/palliative right lumbar facet block #3  Diagnostic/palliative left lumbar facet block #2  Palliative left-sided lumbar facet RFA #2 (last done 12/03/2018)    Recent Visits Date Type Provider Dept  12/03/18 Procedure visit Milinda Pointer, MD [facility]  11/02/18 Office Visit Milinda Pointer, MD [facility]  09/22/18 Procedure visit Milinda Pointer, MD [facility]  09/10/18 Office Visit Milinda Pointer, MD [facility]  Showing recent visits within past 90 days and meeting all other requirements   Future Appointments Date Type Provider Dept  12/17/18 Appointment Milinda Pointer, [facility]  12/23/18 Appointment Milinda Pointer, MD [facility]  Showing future appointments within next 90 days and meeting all other requirements   Disposition: Discharge home  Discharge Date &  Time: 12/03/2018; 1239 hrs.   Primary Care Physician: [name], MD Location: [facility] Note by: Gaspar Cola, MD Date: 12/03/2018; Time: 12:26 PM  Disclaimer:  Medicine is not an Chief Strategy Officer. The only guarantee in medicine is that nothing is guaranteed. It is important to note that the decision to proceed with this intervention was based on the information  collected from the patient. The Data and conclusions were drawn from the patient's questionnaire, the interview, and the physical examination. Because the information was provided in large part by the patient, it cannot be guaranteed that it has not been purposely or unconsciously manipulated. Every effort has been made to obtain as much relevant data as possible for this evaluation. It is important to note that the conclusions that lead to this procedure are derived in large part from the available data. Always take into account that the treatment will also be dependent on availability of resources and existing treatment guidelines, considered by other Pain Management Practitioners as being common knowledge and practice, at the time of the intervention. For Medico-Legal purposes, it is also important to point out that variation in procedural techniques and pharmacological choices are the acceptable norm. The indications, contraindications, technique, and results of the above procedure should only be interpreted and judged by a Board-Certified Interventional Pain Specialist with extensive familiarity and expertise in the same exact procedure and technique.

## 2018-12-03 ENCOUNTER — Encounter: Payer: Self-pay | Admitting: Pain Medicine

## 2018-12-03 ENCOUNTER — Ambulatory Visit
Admission: RE | Admit: 2018-12-03 | Discharge: 2018-12-03 | Disposition: A | Discharge status: Home / Self Care | Payer: Medicare Other | Source: Ambulatory Visit | Attending: Pain Medicine | Admitting: Pain Medicine

## 2018-12-03 ENCOUNTER — Other Ambulatory Visit: Payer: Self-pay | Admitting: Pain Medicine

## 2018-12-03 ENCOUNTER — Other Ambulatory Visit: Payer: Self-pay

## 2018-12-03 ENCOUNTER — Ambulatory Visit (HOSPITAL_BASED_OUTPATIENT_CLINIC_OR_DEPARTMENT_OTHER): Payer: Medicare Other | Admitting: Pain Medicine

## 2018-12-03 VITALS — BP 120/78 | HR 83 | Temp 98.4°F | Resp 19 | Ht 69.0 in | Wt 225.0 lb

## 2018-12-03 DIAGNOSIS — M47817 Spondylosis without myelopathy or radiculopathy, lumbosacral region: Secondary | ICD-10-CM | POA: Diagnosis present

## 2018-12-03 DIAGNOSIS — G8918 Other acute postprocedural pain: Secondary | ICD-10-CM

## 2018-12-03 DIAGNOSIS — Z7901 Long term (current) use of anticoagulants: Secondary | ICD-10-CM | POA: Insufficient documentation

## 2018-12-03 DIAGNOSIS — M5137 Other intervertebral disc degeneration, lumbosacral region: Secondary | ICD-10-CM | POA: Insufficient documentation

## 2018-12-03 DIAGNOSIS — E0842 Diabetes mellitus due to underlying condition with diabetic polyneuropathy: Secondary | ICD-10-CM

## 2018-12-03 DIAGNOSIS — M47816 Spondylosis without myelopathy or radiculopathy, lumbar region: Secondary | ICD-10-CM

## 2018-12-03 DIAGNOSIS — M5442 Lumbago with sciatica, left side: Secondary | ICD-10-CM | POA: Insufficient documentation

## 2018-12-03 DIAGNOSIS — G8929 Other chronic pain: Secondary | ICD-10-CM | POA: Insufficient documentation

## 2018-12-03 DIAGNOSIS — G894 Chronic pain syndrome: Secondary | ICD-10-CM

## 2018-12-03 DIAGNOSIS — M7918 Myalgia, other site: Secondary | ICD-10-CM

## 2018-12-03 DIAGNOSIS — M792 Neuralgia and neuritis, unspecified: Secondary | ICD-10-CM

## 2018-12-03 DIAGNOSIS — K2270 Barrett's esophagus without dysplasia: Secondary | ICD-10-CM

## 2018-12-03 DIAGNOSIS — M961 Postlaminectomy syndrome, not elsewhere classified: Secondary | ICD-10-CM

## 2018-12-03 DIAGNOSIS — M51379 Other intervertebral disc degeneration, lumbosacral region without mention of lumbar back pain or lower extremity pain: Secondary | ICD-10-CM

## 2018-12-03 DIAGNOSIS — K219 Gastro-esophageal reflux disease without esophagitis: Secondary | ICD-10-CM

## 2018-12-03 DIAGNOSIS — G629 Polyneuropathy, unspecified: Secondary | ICD-10-CM

## 2018-12-03 HISTORY — DX: Other acute postprocedural pain: G89.18

## 2018-12-03 MED ORDER — HYDROCODONE-ACETAMINOPHEN 5-325 MG PO TABS: 1.0000 | ORAL_TABLET | Freq: Three times a day (TID) | ORAL | 0 refills | Status: AC | PRN

## 2018-12-03 MED ORDER — MIDAZOLAM HCL 5 MG/5ML IJ SOLN
1.0000 mg | INTRAMUSCULAR | Status: AC | PRN
  Administered 2018-12-03: 2 mg via INTRAVENOUS
  Filled 2018-12-03: qty 5

## 2018-12-03 MED ORDER — OXYCODONE HCL 10 MG PO TABS: 10.0000 mg | ORAL_TABLET | Freq: Four times a day (QID) | ORAL | 0 refills | Status: DC | PRN

## 2018-12-03 MED ORDER — ROPIVACAINE HCL 2 MG/ML IJ SOLN
9.0000 mL | Freq: Once | INTRAMUSCULAR | Status: AC
  Administered 2018-12-03: 12:00:00 10 mL via PERINEURAL
  Filled 2018-12-03: qty 10

## 2018-12-03 MED ORDER — LIDOCAINE HCL 2 % IJ SOLN
20.0000 mL | Freq: Once | INTRAMUSCULAR | Status: AC
  Administered 2018-12-03: 400 mg
  Filled 2018-12-03: qty 20

## 2018-12-03 MED ORDER — TRIAMCINOLONE ACETONIDE 40 MG/ML IJ SUSP
40.0000 mg | Freq: Once | INTRAMUSCULAR | Status: AC
  Administered 2018-12-03: 40 mg
  Filled 2018-12-03: qty 1

## 2018-12-03 MED ORDER — CYCLOBENZAPRINE HCL 10 MG PO TABS: 10.0000 mg | ORAL_TABLET | Freq: Three times a day (TID) | ORAL | 1 refills | Status: DC | PRN

## 2018-12-03 MED ORDER — GABAPENTIN 800 MG PO TABS: 1600.0000 mg | ORAL_TABLET | Freq: Two times a day (BID) | ORAL | 1 refills | Status: DC

## 2018-12-03 MED ORDER — FENTANYL CITRATE (PF) 100 MCG/2ML IJ SOLN
25.0000 ug | INTRAMUSCULAR | Status: AC | PRN
  Administered 2018-12-03: 75 ug via INTRAVENOUS
  Filled 2018-12-03: qty 2

## 2018-12-03 MED ORDER — MAGNESIUM OXIDE -MG SUPPLEMENT 500 MG PO TABS: 500.0000 mg | ORAL_TABLET | Freq: Every day | ORAL | 1 refills | Status: DC

## 2018-12-03 MED ORDER — LACTATED RINGERS IV SOLN
1000.0000 mL | Freq: Once | INTRAVENOUS | Status: AC
  Administered 2018-12-03: 1000 mL via INTRAVENOUS

## 2018-12-04 ENCOUNTER — Telehealth: Payer: Self-pay | Admitting: *Deleted

## 2018-12-04 NOTE — Telephone Encounter (Signed)
Spoke with patient re; procedure on yesterday, verbalizes no questions or concerns.  He did state that last night he experienced a metal taste in his mouth and he was thinking back to during the procedure when Dr Dossie Arbour was going through his checks of metal taste, ringing in the ears etc.  States the taste went away once he drank some water and has not returned.  Patient reassurred and has not other questions or concerns.

## 2018-12-08 ENCOUNTER — Other Ambulatory Visit: Payer: Self-pay | Admitting: Pain Medicine

## 2018-12-08 DIAGNOSIS — G8929 Other chronic pain: Secondary | ICD-10-CM

## 2018-12-08 DIAGNOSIS — G629 Polyneuropathy, unspecified: Secondary | ICD-10-CM

## 2018-12-08 DIAGNOSIS — M792 Neuralgia and neuritis, unspecified: Secondary | ICD-10-CM

## 2018-12-08 DIAGNOSIS — E0842 Diabetes mellitus due to underlying condition with diabetic polyneuropathy: Secondary | ICD-10-CM

## 2018-12-08 DIAGNOSIS — M7918 Myalgia, other site: Secondary | ICD-10-CM

## 2018-12-17 ENCOUNTER — Ambulatory Visit
Admission: RE | Admit: 2018-12-17 | Discharge: 2018-12-17 | Disposition: A | Discharge status: Home / Self Care | Payer: Medicare Other | Source: Ambulatory Visit | Attending: Pain Medicine | Admitting: Pain Medicine

## 2018-12-17 ENCOUNTER — Other Ambulatory Visit: Payer: Self-pay

## 2018-12-17 ENCOUNTER — Ambulatory Visit (HOSPITAL_BASED_OUTPATIENT_CLINIC_OR_DEPARTMENT_OTHER): Payer: Medicare Other | Admitting: Pain Medicine

## 2018-12-17 ENCOUNTER — Encounter: Payer: Self-pay | Admitting: Pain Medicine

## 2018-12-17 VITALS — BP 128/89 | HR 94 | Temp 98.1°F | Resp 16 | Ht 69.0 in | Wt 221.0 lb

## 2018-12-17 DIAGNOSIS — M5137 Other intervertebral disc degeneration, lumbosacral region: Secondary | ICD-10-CM

## 2018-12-17 DIAGNOSIS — M47816 Spondylosis without myelopathy or radiculopathy, lumbar region: Secondary | ICD-10-CM | POA: Insufficient documentation

## 2018-12-17 DIAGNOSIS — G8918 Other acute postprocedural pain: Secondary | ICD-10-CM | POA: Insufficient documentation

## 2018-12-17 DIAGNOSIS — G8929 Other chronic pain: Secondary | ICD-10-CM | POA: Insufficient documentation

## 2018-12-17 DIAGNOSIS — M5442 Lumbago with sciatica, left side: Secondary | ICD-10-CM

## 2018-12-17 DIAGNOSIS — Z09 Encounter for follow-up examination after completed treatment for conditions other than malignant neoplasm: Secondary | ICD-10-CM | POA: Insufficient documentation

## 2018-12-17 DIAGNOSIS — Z7901 Long term (current) use of anticoagulants: Secondary | ICD-10-CM

## 2018-12-17 DIAGNOSIS — M47817 Spondylosis without myelopathy or radiculopathy, lumbosacral region: Secondary | ICD-10-CM | POA: Insufficient documentation

## 2018-12-17 MED ORDER — LACTATED RINGERS IV SOLN
1000.0000 mL | Freq: Once | INTRAVENOUS | Status: AC
  Administered 2018-12-17: 12:00:00 1000 mL via INTRAVENOUS

## 2018-12-17 MED ORDER — MIDAZOLAM HCL 5 MG/5ML IJ SOLN
1.0000 mg | INTRAMUSCULAR | Status: DC | PRN
  Administered 2018-12-17: 3 mg via INTRAVENOUS
  Filled 2018-12-17: qty 5

## 2018-12-17 MED ORDER — TRIAMCINOLONE ACETONIDE 40 MG/ML IJ SUSP
40.0000 mg | Freq: Once | INTRAMUSCULAR | Status: AC
  Administered 2018-12-17: 40 mg
  Filled 2018-12-17: qty 1

## 2018-12-17 MED ORDER — ROPIVACAINE HCL 2 MG/ML IJ SOLN
9.0000 mL | Freq: Once | INTRAMUSCULAR | Status: AC
  Administered 2018-12-17: 9 mL via PERINEURAL
  Filled 2018-12-17: qty 10

## 2018-12-17 MED ORDER — HYDROCODONE-ACETAMINOPHEN 5-325 MG PO TABS: 1.0000 | ORAL_TABLET | Freq: Three times a day (TID) | ORAL | 0 refills | Status: AC | PRN

## 2018-12-17 MED ORDER — LIDOCAINE HCL 2 % IJ SOLN
20.0000 mL | Freq: Once | INTRAMUSCULAR | Status: AC
  Administered 2018-12-17: 400 mg
  Filled 2018-12-17: qty 40

## 2018-12-17 MED ORDER — FENTANYL CITRATE (PF) 100 MCG/2ML IJ SOLN
25.0000 ug | INTRAMUSCULAR | Status: DC | PRN
  Administered 2018-12-17: 100 ug via INTRAVENOUS
  Filled 2018-12-17: qty 2

## 2018-12-17 NOTE — Patient Instructions (Signed)
___________________________________________________________________________________________  Post-Radiofrequency (RF) Discharge Instructions  You have just completed a Radiofrequency Neurotomy.  The following instructions will provide you with information and guidelines for self-care upon discharge.  If at any time you have questions or concerns please call your physician. DO NOT DRIVE YOURSELF!!  Instructions:  Apply ice: Fill a plastic sandwich bag with crushed ice. Cover it with a small towel and apply to injection site. Apply for 15 minutes then remove x 15 minutes. Repeat sequence on day of procedure, until you go to bed. The purpose is to minimize swelling and discomfort after procedure.  Apply heat: Apply heat to procedure site starting the day following the procedure. The purpose is to treat any soreness and discomfort from the procedure.  Food intake: No eating limitations, unless stipulated above.  Nevertheless, if you have had sedation, you may experience some nausea.  In this case, it may be wise to wait at least two hours prior to resuming regular diet.  Physical activities: Keep activities to a minimum for the first 8 hours after the procedure. For the first 24 hours after the procedure, do not drive a motor vehicle,  Operate heavy machinery, power tools, or handle any weapons.  Consider walking with the use of an assistive device or accompanied by an adult for the first 24 hours.  Do not drink alcoholic beverages including beer.  Do not make any important decisions or sign any legal documents. Go home and rest today.  Resume activities tomorrow, as tolerated.  Use caution in moving about as you may experience mild leg weakness.  Use caution in cooking, use of household electrical appliances and climbing steps.  Driving: If you have received any sedation, you are not allowed to drive for 24 hours after your procedure.  Blood thinner: Restart your blood thinner 6 hours after your  procedure. (Only for those taking blood thinners)  Insulin: As soon as you can eat, you may resume your normal dosing schedule. (Only for those taking insulin)  Medications: May resume pre-procedure medications.  Do not take any drugs, other than what has been prescribed to you.  Infection prevention: Keep procedure site clean and dry.  Post-procedure Pain Diary: Extremely important that this be done correctly and accurately. Recorded information will be used to determine the next step in treatment.  Pain evaluated is that of treated area only. Do not include pain from an untreated area.  Complete every hour, on the hour, for the initial 8 hours. Set an alarm to help you do this part accurately.  Do not go to sleep and have it completed later. It will not be accurate.  Follow-up appointment: Keep your follow-up appointment after the procedure. Usually 2 weeks for most procedures. (6 weeks in the case of radiofrequency.) Bring you pain diary.   Expect:  From numbing medicine (AKA: Local Anesthetics): Numbness or decrease in pain.  Onset: Full effect within 15 minutes of injected.  Duration: It will depend on the type of local anesthetic used. On the average, 1 to 8 hours.   From steroids: Decrease in swelling or inflammation. Once inflammation is improved, relief of the pain will follow.  Onset of benefits: Depends on the amount of swelling present. The more swelling, the longer it will take for the benefits to be seen. In some cases, up to 10 days.  Duration: Steroids will stay in the system x 2 weeks. Duration of benefits will depend on multiple posibilities including persistent irritating factors.  From procedure: Some  discomfort is to be expected once the numbing medicine wears off. This should be minimal if ice and heat are applied as instructed.  Call if:  You experience numbness and weakness that gets worse with time, as opposed to wearing off.  He experience any unusual  bleeding, difficulty breathing, or loss of the ability to control your bowel and bladder. (This applies to Spinal procedures only)  You experience any redness, swelling, heat, red streaks, elevated temperature, fever, or any other signs of a possible infection.  Emergency Numbers:  [place] business hours (Monday - Thursday, 8:00 AM - 4:00 PM) (Friday, 9:00 AM - 12:00 Noon): [phone]  After hours: [phone] ____________________________________________________________________________________________

## 2018-12-17 NOTE — Progress Notes (Signed)
Patient's Name: Kenneth Osborn  MRN: [id number]  Referring Provider: [name], MD  DOB: [birth year]  PCP: [name], MD  DOS: 12/17/2018  Note by: Gaspar Cola, MD  Service setting: Ambulatory outpatient  Specialty: Interventional Pain Management  Patient type: Established  Location: [facility] (AMB) Pain Management Facility  Visit type: Interventional Procedure   Primary Reason for Visit: Interventional Pain Management Treatment. CC: Back Pain  Procedure:          Anesthesia, Analgesia, Anxiolysis:  Type: Thermal Lumbar Facet, Medial Branch Radiofrequency Ablation/Neurotomy  #1  Primary Purpose: Therapeutic Region: Posterolateral Lumbosacral Spine Level: L2, L3, L4, L5, & S1 Medial Branch Level(s). These levels will denervate the L3-4, L4-5, and the L5-S1 lumbar facet joints. Laterality: Right  Type: Moderate (Conscious) Sedation combined with Local Anesthesia Indication(s): Analgesia and Anxiety Route: Intravenous (IV) IV Access: Secured Sedation: Meaningful verbal contact was maintained at all times during the procedure  Local Anesthetic: Lidocaine 1-2%  Position: Prone   Indications: 1. Lumbar facet syndrome (Bilateral) (R>L)   2. Spondylosis without myelopathy or radiculopathy, lumbosacral region   3. Lumbar spondylosis   4. DDD (degenerative disc disease), lumbosacral   5. Chronic low back pain (Primary Area of Pain) (Bilateral) (L>R)    Mr. Rihn has been dealing with the above chronic pain for longer than three months and has either failed to respond, was unable to tolerate, or simply did not get enough benefit from other more conservative therapies including, but not limited to: 1. Over-the-counter medications 2. Anti-inflammatory medications 3. Muscle relaxants 4. Membrane stabilizers 5. Opioids 6. Physical therapy and/or chiropractic manipulation 7. Modalities (Heat, ice, etc.) 8. Invasive techniques such as nerve blocks. Mr. Pankow has attained more than  50% relief of the pain from a series of diagnostic injections conducted in separate occasions.  Pain Score: Pre-procedure: 1 /10 Post-procedure: 0-No pain/10  Post-Procedure Evaluation  Procedure: Therapeutic left-sided lumbar facet RFA #1 under fluoroscopic guidance and IV sedation Pre-procedure pain level:  2/10 Post-procedure: 0/10 (100% relief)  Sedation: Sedation provided.  Effectiveness during initial hour after procedure(Ultra-Short Term Relief): 100 %   Local anesthetic used: Long-acting (4-6 hours) Effectiveness: Defined as any analgesic benefit obtained secondary to the administration of local anesthetics. This carries significant diagnostic value as to the etiological location, or anatomical origin, of the pain. Duration of benefit is expected to coincide with the duration of the local anesthetic used.  Effectiveness during initial 4-6 hours after procedure(Short-Term Relief): 100 %   Long-term benefit: Defined as any relief past the pharmacologic duration of the local anesthetics.  Effectiveness past the initial 6 hours after procedure(Long-Term Relief): 100 %   Current benefits: Defined as benefit that persist at this time.   Analgesia:  100% better Function: Mr. Alicea reports improvement in function ROM: Mr. Steinhoff reports improvement in ROM  Pre-op Assessment:  Mr. Dudash is a 63 y.o. (year old), male patient, seen today for interventional treatment. He  has a past surgical history that includes Cardiovascular stress test; Cardiac catheterization; Coronary angioplasty; Labrinthectomy; mastoid shunt; Appendectomy; Back surgery; Shoulder arthroscopy with subacromial decompression (Left, 04/06/2012); ARTHRODESIS ANTERIOR ANTERIOR CERVICLE SPINE (01/04/2013); Colonoscopy with propofol (N/A, 09/19/2014); Esophagogastroduodenoscopy (N/A, 09/19/2014); Savory dilation (N/A, 09/19/2014); Spinal cord stimulator implant (Right); Cardiac catheterization (N/A, 06/15/2015); and Cardiac  catheterization (N/A, 06/15/2015). Mr. Sheeler has a current medication list which includes the following prescription(s): alprazolam, alprazolam, aspirin ec, cetirizine, cyclobenzaprine, diazepam, diltiazem, enalapril, esomeprazole, ezetimibe, gabapentin, humulin r u-500 kwikpen, hydrocodone-acetaminophen, hydrocodone-acetaminophen, hydrocodone-acetaminophen, isosorbide mononitrate,  magnesium oxide, meclizine, metformin, metoprolol succinate, montelukast, naloxone, niacin, nitroglycerin, fish oil, oxycodone hcl, oxycodone hcl, oxycodone hcl, oxycodone hcl, prasugrel, and rosuvastatin, and the following Facility-Administered Medications: fentanyl and midazolam. His primarily concern today is the Back Pain  Initial Vital Signs:  Pulse/HCG Rate: 94ECG Heart Rate: 94 Temp: 98.6 F (37 C) Resp: 16 BP: 111/86 SpO2: 99 %  BMI: Estimated body mass index is 32.64 kg/m as calculated from the following:   Height as of this encounter: 5' 9"  (1.753 m).   Weight as of this encounter: 221 lb (100.2 kg).  Risk Assessment: Allergies: Reviewed. He has No Known Allergies.  Allergy Precautions: None required Coagulopathies: Reviewed. None identified.  Blood-thinner therapy: None at this time Active Infection(s): Reviewed. None identified. Mr. Borton is afebrile  Site Confirmation: Mr. Manson was asked to confirm the procedure and laterality before marking the site Procedure checklist: Completed Consent: Before the procedure and under the influence of no sedative(s), amnesic(s), or anxiolytics, the patient was informed of the treatment options, risks and possible complications. To fulfill our ethical and legal obligations, as recommended by the American Medical Association's Code of Ethics, I have informed the patient of my clinical impression; the nature and purpose of the treatment or procedure; the risks, benefits, and possible complications of the intervention; the alternatives, including doing nothing; the  risk(s) and benefit(s) of the alternative treatment(s) or procedure(s); and the risk(s) and benefit(s) of doing nothing. The patient was provided information about the general risks and possible complications associated with the procedure. These may include, but are not limited to: failure to achieve desired goals, infection, bleeding, organ or nerve damage, allergic reactions, paralysis, and death. In addition, the patient was informed of those risks and complications associated to Spine-related procedures, such as failure to decrease pain; infection (i.e.: Meningitis, epidural or intraspinal abscess); bleeding (i.e.: epidural hematoma, subarachnoid hemorrhage, or any other type of intraspinal or peri-dural bleeding); organ or nerve damage (i.e.: Any type of peripheral nerve, nerve root, or spinal cord injury) with subsequent damage to sensory, motor, and/or autonomic systems, resulting in permanent pain, numbness, and/or weakness of one or several areas of the body; allergic reactions; (i.e.: anaphylactic reaction); and/or death. Furthermore, the patient was informed of those risks and complications associated with the medications. These include, but are not limited to: allergic reactions (i.e.: anaphylactic or anaphylactoid reaction(s)); adrenal axis suppression; blood sugar elevation that in diabetics may result in ketoacidosis or comma; water retention that in patients with history of congestive heart failure may result in shortness of breath, pulmonary edema, and decompensation with resultant heart failure; weight gain; swelling or edema; medication-induced neural toxicity; particulate matter embolism and blood vessel occlusion with resultant organ, and/or nervous system infarction; and/or aseptic necrosis of one or more joints. Finally, the patient was informed that Medicine is not an exact science; therefore, there is also the possibility of unforeseen or unpredictable risks and/or possible complications  that may result in a catastrophic outcome. The patient indicated having understood very clearly. We have given the patient no guarantees and we have made no promises. Enough time was given to the patient to ask questions, all of which were answered to the patient's satisfaction. Mr. Wong has indicated that he wanted to continue with the procedure. Attestation: I, the ordering provider, attest that I have discussed with the patient the benefits, risks, side-effects, alternatives, likelihood of achieving goals, and potential problems during recovery for the procedure that I have provided informed consent. Date   Time:  12/17/2018 10:43 AM  Pre-Procedure Preparation:  Monitoring: As per clinic protocol. Respiration, ETCO2, SpO2, BP, heart rate and rhythm monitor placed and checked for adequate function Safety Precautions: Patient was assessed for positional comfort and pressure points before starting the procedure. Time-out: I initiated and conducted the "Time-out" before starting the procedure, as per protocol. The patient was asked to participate by confirming the accuracy of the "Time Out" information. Verification of the correct person, site, and procedure were performed and confirmed by me, the nursing staff, and the patient. "Time-out" conducted as per Joint Commission's Universal Protocol (UP.01.01.01). Time: 1153  Description of Procedure:          Laterality: Right Levels:  L2, L3, L4, L5, & S1 Medial Branch Level(s), at the L3-4, L4-5, and the L5-S1 lumbar facet joints. Area Prepped: Lumbosacral Prepping solution: DuraPrep (Iodine Povacrylex [0.7% available iodine] and Isopropyl Alcohol, 74% w/w) Safety Precautions: Aspiration looking for blood return was conducted prior to all injections. At no point did we inject any substances, as a needle was being advanced. Before injecting, the patient was told to immediately notify me if he was experiencing any new onset of "ringing in the ears, or  metallic taste in the mouth". No attempts were made at seeking any paresthesias. Safe injection practices and needle disposal techniques used. Medications properly checked for expiration dates. SDV (single dose vial) medications used. After the completion of the procedure, all disposable equipment used was discarded in the proper designated medical waste containers. Local Anesthesia: Protocol guidelines were followed. The patient was positioned over the fluoroscopy table. The area was prepped in the usual manner. The time-out was completed. The target area was identified using fluoroscopy. A 12-in long, straight, sterile hemostat was used with fluoroscopic guidance to locate the targets for each level blocked. Once located, the skin was marked with an approved surgical skin marker. Once all sites were marked, the skin (epidermis, dermis, and hypodermis), as well as deeper tissues (fat, connective tissue and muscle) were infiltrated with a small amount of a short-acting local anesthetic, loaded on a 10cc syringe with a 25G, 1.5-in  Needle. An appropriate amount of time was allowed for local anesthetics to take effect before proceeding to the next step. Local Anesthetic: Lidocaine 2.0% The unused portion of the local anesthetic was discarded in the proper designated containers. Technical explanation of process:  Radiofrequency Ablation (RFA) L2 Medial Branch Nerve RFA: The target area for the L2 medial branch is at the junction of the postero-lateral aspect of the superior articular process and the superior, posterior, and medial edge of the transverse process of L3. Under fluoroscopic guidance, a Radiofrequency needle was inserted until contact was made with os over the superior postero-lateral aspect of the pedicular shadow (target area). Sensory and motor testing was conducted to properly adjust the position of the needle. Once satisfactory placement of the needle was achieved, the numbing solution was slowly  injected after negative aspiration for blood. 2.0 mL of the nerve block solution was injected without difficulty or complication. After waiting for at least 3 minutes, the ablation was performed. Once completed, the needle was removed intact. L3 Medial Branch Nerve RFA: The target area for the L3 medial branch is at the junction of the postero-lateral aspect of the superior articular process and the superior, posterior, and medial edge of the transverse process of L4. Under fluoroscopic guidance, a Radiofrequency needle was inserted until contact was made with os over the superior postero-lateral aspect of the pedicular shadow (  target area). Sensory and motor testing was conducted to properly adjust the position of the needle. Once satisfactory placement of the needle was achieved, the numbing solution was slowly injected after negative aspiration for blood. 2.0 mL of the nerve block solution was injected without difficulty or complication. After waiting for at least 3 minutes, the ablation was performed. Once completed, the needle was removed intact. L4 Medial Branch Nerve RFA: The target area for the L4 medial branch is at the junction of the postero-lateral aspect of the superior articular process and the superior, posterior, and medial edge of the transverse process of L5. Under fluoroscopic guidance, a Radiofrequency needle was inserted until contact was made with os over the superior postero-lateral aspect of the pedicular shadow (target area). Sensory and motor testing was conducted to properly adjust the position of the needle. Once satisfactory placement of the needle was achieved, the numbing solution was slowly injected after negative aspiration for blood. 2.0 mL of the nerve block solution was injected without difficulty or complication. After waiting for at least 3 minutes, the ablation was performed. Once completed, the needle was removed intact. L5 Medial Branch Nerve RFA: The target area for the  L5 medial branch is at the junction of the postero-lateral aspect of the superior articular process of S1 and the superior, posterior, and medial edge of the sacral ala. Under fluoroscopic guidance, a Radiofrequency needle was inserted until contact was made with os over the superior postero-lateral aspect of the pedicular shadow (target area). Sensory and motor testing was conducted to properly adjust the position of the needle. Once satisfactory placement of the needle was achieved, the numbing solution was slowly injected after negative aspiration for blood. 2.0 mL of the nerve block solution was injected without difficulty or complication. After waiting for at least 3 minutes, the ablation was performed. Once completed, the needle was removed intact. S1 Medial Branch Nerve RFA: The target area for the S1 medial branch is located inferior to the junction of the S1 superior articular process and the L5 inferior articular process, posterior, inferior, and lateral to the 6 o'clock position of the L5-S1 facet joint, just superior to the S1 posterior foramen. Under fluoroscopic guidance, the Radiofrequency needle was advanced until contact was made with os over the Target area. Sensory and motor testing was conducted to properly adjust the position of the needle. Once satisfactory placement of the needle was achieved, the numbing solution was slowly injected after negative aspiration for blood. 2.0 mL of the nerve block solution was injected without difficulty or complication. After waiting for at least 3 minutes, the ablation was performed. Once completed, the needle was removed intact. Radiofrequency lesioning (ablation):  Radiofrequency Generator: NeuroTherm [id number] Sensory Stimulation Parameters: 50 Hz was used to locate & identify the nerve, making sure that the needle was positioned such that there was no sensory stimulation below 0.3 V or above 0.7 V. Motor Stimulation Parameters: 2 Hz was used to evaluate  the motor component. Care was taken not to lesion any nerves that demonstrated motor stimulation of the lower extremities at an output of less than 2.5 times that of the sensory threshold, or a maximum of 2.0 V. Lesioning Technique Parameters: Standard Radiofrequency settings. (Not bipolar or pulsed.) Temperature Settings: 80 degrees C Lesioning time: 60 seconds Intra-operative Compliance: Compliant Materials & Medications: Needle(s) (Electrode/Cannula) Type: Teflon-coated, curved tip, Radiofrequency needle(s) Gauge: 22G Length: 10cm Numbing solution: 0.2% PF-Ropivacaine + Triamcinolone (40 mg/mL) diluted to a final concentration of 4 mg  of Triamcinolone/mL of Ropivacaine The unused portion of the solution was discarded in the proper designated containers.  Once the entire procedure was completed, the treated area was cleaned, making sure to leave some of the prepping solution back to take advantage of its long term bactericidal properties.  Illustration of the posterior view of the lumbar spine and the posterior neural structures. Laminae of L2 through S1 are labeled. DPRL5, dorsal primary ramus of L5; DPRS1, dorsal primary ramus of S1; DPR3, dorsal primary ramus of L3; FJ, facet (zygapophyseal) joint L3-L4; I, inferior articular process of L4; LB1, lateral branch of dorsal primary ramus of L1; IAB, inferior articular branches from L3 medial branch (supplies L4-L5 facet joint); IBP, intermediate branch plexus; MB3, medial branch of dorsal primary ramus of L3; NR3, third lumbar nerve root; S, superior articular process of L5; SAB, superior articular branches from L4 (supplies L4-5 facet joint also); TP3, transverse process of L3.  Vitals:   12/17/18 1225 12/17/18 1235 12/17/18 1245 12/17/18 1254  BP: (!) 151/95 128/81 120/90 128/89  Pulse:      Resp: 18 (!) 22 14 16   Temp:  98.1 F (36.7 C)  98.1 F (36.7 C)  TempSrc:      SpO2: 96% 97% 97% 98%  Weight:      Height:        Start Time:  1153 hrs. End Time: 1225 hrs.  Imaging Guidance (Spinal):          Type of Imaging Technique: Fluoroscopy Guidance (Spinal) Indication(s): Assistance in needle guidance and placement for procedures requiring needle placement in or near specific anatomical locations not easily accessible without such assistance. Exposure Time: Please see nurses notes. Contrast: None used. Fluoroscopic Guidance: I was personally present during the use of fluoroscopy. "Tunnel Vision Technique" used to obtain the best possible view of the target area. Parallax error corrected before commencing the procedure. "Direction-depth-direction" technique used to introduce the needle under continuous pulsed fluoroscopy. Once target was reached, antero-posterior, oblique, and lateral fluoroscopic projection used confirm needle placement in all planes. Images permanently stored in EMR. Interpretation: No contrast injected. I personally interpreted the imaging intraoperatively. Adequate needle placement confirmed in multiple planes. Permanent images saved into the patient's record.  Antibiotic Prophylaxis:   Anti-infectives (From admission, onward)   None     Indication(s): None identified  Post-operative Assessment:  Post-procedure Vital Signs:  Pulse/HCG Rate: 9489 Temp: 98.1 F (36.7 C) Resp: 16 BP: 128/89 SpO2: 98 %  EBL: None  Complications: No immediate post-treatment complications observed by team, or reported by patient.  Note: The patient tolerated the entire procedure well. A repeat set of vitals were taken after the procedure and the patient was kept under observation following institutional policy, for this type of procedure. Post-procedural neurological assessment was performed, showing return to baseline, prior to discharge. The patient was provided with post-procedure discharge instructions, including a section on how to identify potential problems. Should any problems arise concerning this procedure,  the patient was given instructions to immediately contact us, at any time, without hesitation. In any case, we plan to contact the patient by telephone for a follow-up status report regarding this interventional procedure.  Comments:  No additional relevant information.  Plan of Care  Orders:  Orders Placed This Encounter  Procedures   Radiofrequency,Lumbar    Scheduling Instructions:     Side(s): Right-sided     Level: L3-4, L4-5, & L5-S1 Facets (L2, L3, L4, L5, & S1 Medial Branch Nerves)  Sedation: With Sedation     Timeframe: Today    Order Specific Question:   Where will this procedure be performed?    Answer:   [facility]   DG PAIN CLINIC C-ARM 1-60 MIN NO REPORT    Intraoperative interpretation by procedural physician at [place].    Standing Status:   Standing    Number of Occurrences:   1    Order Specific Question:   Reason for exam:    Answer:   Assistance in needle guidance and placement for procedures requiring needle placement in or near specific anatomical locations not easily accessible without such assistance.   Informed Consent Details: Physician/Practitioner Attestation; Transcribe to consent form and obtain patient signature    Nursing Order: Transcribe to consent form and obtain patient signature. Note: Always confirm laterality of pain with Mr. Pietsch, before procedure. Procedure: Lumbar Facet Radiofrequency Ablation Indication/Reason: Low Back Pain, with our without leg pain, due to Facet Joint Arthralgia (Joint Pain) known as Lumbar Facet Syndrome, secondary to Lumbar, and/or Lumbosacral Spondylosis (Arthritis of the Spine), without myelopathy or radiculopathy (Nerve Damage). Provider Attestation: I, Browndell Dossie Arbour, MD, (Pain Management Specialist), the physician/practitioner, attest that I have discussed with the patient the benefits, risks, side effects, alternatives, likelihood of achieving goals and potential problems during  recovery for the procedure that I have provided informed consent.   Care order/instruction: Please confirm that the patient has stopped the Effient (Prasugrel) x 10 days prior to procedure or surgery.    Please confirm that the patient has stopped the Effient (Prasugrel) x 10 days prior to procedure or surgery.    Standing Status:   Standing    Number of Occurrences:   1   Provide equipment / supplies at bedside    Equipment required: Single use, disposable, "Block Tray"    Standing Status:   Standing    Number of Occurrences:   1    Order Specific Question:   Specify    Answer:   Block Tray   Nursing Instructions:    Please complete this patient's postprocedure evaluation.    Scheduling Instructions:     Please complete this patient's postprocedure evaluation.   Bleeding precautions    Standing Status:   Standing    Number of Occurrences:   1   Chronic Opioid Analgesic:  Oxycodone IR 10 mg, 1 tab PO q 6 hrs (40 mg/day of oxycodone) MME/day:60 mg/day.   Medications ordered for procedure: Meds ordered this encounter  Medications   lidocaine (XYLOCAINE) 2 % (with pres) injection 400 mg   lactated ringers infusion 1,000 mL   midazolam (VERSED) 5 MG/5ML injection 1-2 mg    Make sure Flumazenil is available in the pyxis when using this medication. If oversedation occurs, administer 0.2 mg IV over 15 sec. If after 45 sec no response, administer 0.2 mg again over 1 min; may repeat at 1 min intervals; not to exceed 4 doses (1 mg)   fentaNYL (SUBLIMAZE) injection 25-50 mcg    Make sure Narcan is available in the pyxis when using this medication. In the event of respiratory depression (RR< 8/min): Titrate NARCAN (naloxone) in increments of 0.1 to 0.2 mg IV at 2-3 minute intervals, until desired degree of reversal.   triamcinolone acetonide (KENALOG-40) injection 40 mg   ropivacaine (PF) 2 mg/mL (0.2%) (NAROPIN) injection 9 mL   HYDROcodone-acetaminophen (NORCO/VICODIN) 5-325 MG  tablet    Sig: Take 1 tablet by mouth every 8 (eight) hours  as needed for up to 7 days for severe pain. Must last 7 days.    Dispense:  21 tablet    Refill:  0    For acute post-operative pain. Not to be refilled. Must last 7 days.   HYDROcodone-acetaminophen (NORCO/VICODIN) 5-325 MG tablet    Sig: Take 1 tablet by mouth every 8 (eight) hours as needed for up to 7 days for severe pain. Must last for 7 days.    Dispense:  21 tablet    Refill:  0    For acute post-operative pain. Not to be refilled. Must last 7 days.   Medications administered: We administered lidocaine, lactated ringers, midazolam, fentaNYL, triamcinolone acetonide, and ropivacaine (PF) 2 mg/mL (0.2%).  See the medical record for exact dosing, route, and time of administration.  Follow-up plan:   Return in about 6 weeks (around 01/28/2019) for (VV), (PP).       Considering: NOTE: EFFIENT Anticoagulation (Stop: 7-10 days   Re-start: 6 hrs) Therapeutic right-sided lumbar facet RFA #1 (Next)   Palliative PRN treatment(s): Palliative right PSIS MNB/TPI  Diagnostic/palliative right lumbar facet block #3  Diagnostic/palliative left lumbar facet block #2  Palliative left-sided lumbar facet RFA #2 (last done 12/03/2018)     Recent Visits Date Type Provider Dept  12/03/18 Procedure visit Milinda Pointer, MD [facility]  11/02/18 Office Visit Milinda Pointer, MD [facility]  09/22/18 Procedure visit Milinda Pointer, MD [facility]  Showing recent visits within past 90 days and meeting all other requirements   Today's Visits Date Type Provider Dept  12/17/18 Procedure visit Milinda Pointer, MD [facility]  Showing today's visits and meeting all other requirements   Future Appointments Date Type Provider Dept  12/23/18 Appointment Milinda Pointer, [facility]  01/25/19 Appointment Milinda Pointer, MD [facility]  Showing future  appointments within next 90 days and meeting all other requirements   Disposition: Discharge home  Discharge Date & Time: 12/17/2018; 1255 hrs.   Primary Care Physician: [name], MD Location: [facility] Note by: Gaspar Cola, MD Date: 12/17/2018; Time: 12:56 PM  Disclaimer:  Medicine is not an Chief Strategy Officer. The only guarantee in medicine is that nothing is guaranteed. It is important to note that the decision to proceed with this intervention was based on the information collected from the patient. The Data and conclusions were drawn from the patient's questionnaire, the interview, and the physical examination. Because the information was provided in large part by the patient, it cannot be guaranteed that it has not been purposely or unconsciously manipulated. Every effort has been made to obtain as much relevant data as possible for this evaluation. It is important to note that the conclusions that lead to this procedure are derived in large part from the available data. Always take into account that the treatment will also be dependent on availability of resources and existing treatment guidelines, considered by other Pain Management Practitioners as being common knowledge and practice, at the time of the intervention. For Medico-Legal purposes, it is also important to point out that variation in procedural techniques and pharmacological choices are the acceptable norm. The indications, contraindications, technique, and results of the above procedure should only be interpreted and judged by a Board-Certified Interventional Pain Specialist with extensive familiarity and expertise in the same exact procedure and technique.

## 2018-12-17 NOTE — Progress Notes (Signed)
Safety precautions to be maintained throughout the outpatient stay will include: orient to surroundings, keep bed in low position, maintain call bell within reach at all times, provide assistance with transfer out of bed and ambulation.  

## 2018-12-18 ENCOUNTER — Telehealth: Payer: Self-pay | Admitting: *Deleted

## 2018-12-18 NOTE — Telephone Encounter (Signed)
No problems post procedure. 

## 2018-12-23 ENCOUNTER — Ambulatory Visit: Payer: Medicare Other | Admitting: Pain Medicine

## 2019-01-06 ENCOUNTER — Other Ambulatory Visit: Payer: Self-pay | Admitting: Neurosurgery

## 2019-01-07 NOTE — Addendum Note (Signed)
Addended by: [name] on: 01/07/2019 08:13 AM   Modules accepted: Orders

## 2019-01-08 ENCOUNTER — Other Ambulatory Visit: Payer: Self-pay

## 2019-01-08 ENCOUNTER — Encounter
Admission: RE | Admit: 2019-01-08 | Discharge: 2019-01-08 | Disposition: A | Discharge status: Home / Self Care | Payer: Medicare Other | Source: Ambulatory Visit | Attending: Neurosurgery | Admitting: Neurosurgery

## 2019-01-08 DIAGNOSIS — Z01818 Encounter for other preprocedural examination: Secondary | ICD-10-CM | POA: Insufficient documentation

## 2019-01-08 DIAGNOSIS — I10 Essential (primary) hypertension: Secondary | ICD-10-CM | POA: Diagnosis not present

## 2019-01-08 DIAGNOSIS — R000 Tachycardia, unspecified: Secondary | ICD-10-CM | POA: Diagnosis not present

## 2019-01-08 DIAGNOSIS — E119 Type 2 diabetes mellitus without complications: Secondary | ICD-10-CM | POA: Insufficient documentation

## 2019-01-08 LAB — URINALYSIS, ROUTINE W REFLEX MICROSCOPIC
Bilirubin Urine: NEGATIVE
Glucose, UA: NEGATIVE mg/dL
Hgb urine dipstick: NEGATIVE
Ketones, ur: NEGATIVE mg/dL
Leukocytes,Ua: NEGATIVE
Nitrite: NEGATIVE
Protein, ur: NEGATIVE mg/dL
Specific Gravity, Urine: 1.011 (ref 1.005–1.030)
pH: 6 (ref 5.0–8.0)

## 2019-01-08 LAB — BASIC METABOLIC PANEL
Anion gap: 13 (ref 5–15)
BUN: 20 mg/dL (ref 8–23)
CO2: 25 mmol/L (ref 22–32)
Calcium: 9.7 mg/dL (ref 8.9–10.3)
Chloride: 101 mmol/L (ref 98–111)
Creatinine, Ser: 1.46 mg/dL — ABNORMAL HIGH (ref 0.61–1.24)
GFR calc Af Amer: 58 mL/min — ABNORMAL LOW (ref >60)
GFR calc non Af Amer: 50 mL/min — ABNORMAL LOW (ref >60)
Glucose, Bld: 159 mg/dL — ABNORMAL HIGH (ref 70–99)
Potassium: 4.4 mmol/L (ref 3.5–5.1)
Sodium: 139 mmol/L (ref 135–145)

## 2019-01-08 LAB — TYPE AND SCREEN
ABO/RH(D): O POS
Antibody Screen: NEGATIVE

## 2019-01-08 LAB — CBC
HCT: 45.9 % (ref 39.0–52.0)
Hemoglobin: 16 g/dL (ref 13.0–17.0)
MCH: 27.8 pg (ref 26.0–34.0)
MCHC: 34.9 g/dL (ref 30.0–36.0)
MCV: 79.7 fL — ABNORMAL LOW (ref 80.0–100.0)
Platelets: 206 10*3/uL (ref 150–400)
RBC: 5.76 MIL/uL (ref 4.22–5.81)
RDW: 14 % (ref 11.5–15.5)
WBC: 8.5 10*3/uL (ref 4.0–10.5)
nRBC: 0 % (ref 0.0–0.2)

## 2019-01-08 LAB — SURGICAL PCR SCREEN
MRSA, PCR: NEGATIVE
Staphylococcus aureus: NEGATIVE

## 2019-01-08 NOTE — Patient Instructions (Signed)
Your procedure is scheduled on: Monday 01/18/19.  Report to DAY SURGERY DEPARTMENT LOCATED ON 2ND FLOOR MEDICAL MALL ENTRANCE. To find out your arrival time please call [phone] between 1PM - 3PM on Friday 01/15/19.    Remember: Instructions that are not followed completely may result in serious medical risk, up to and including death, or upon the discretion of your surgeon and anesthesiologist your surgery may need to be rescheduled.      _X__ 1. Do not eat food after midnight the night before your procedure.                 No gum chewing or hard candies. You may drink clear liquids up to 2 hours                 before you are scheduled to arrive for your surgery- DO NOT drink clear                 liquids within 2 hours of the start of your surgery.                 Clear Liquids include:  water, apple juice without pulp, clear carbohydrate                 drink such as Clearfast or Gatorade, Black Coffee or Tea (Do not add                 milk or creamer to coffee or tea).    __X__2.  On the morning of surgery brush your teeth with toothpaste and water, you may rinse your mouth with mouthwash if you wish.  Do not swallow any toothpaste or mouthwash.      __X__3.  Notify your doctor if there is any change in your medical condition      (cold, fever, infections).       Do not wear jewelry, make-up, hairpins, clips or nail polish. Do not wear lotions, powders, or perfumes.  Do not shave 48 hours prior to surgery. Men may shave face and neck. Do not bring valuables to the hospital.      [facility] is not responsible for any belongings or valuables.    Contacts, dentures/partials or body piercings may not be worn into surgery. Bring a case for your contacts, glasses or hearing aids, a denture cup will be supplied.     Patients discharged the day of surgery will not be allowed to drive home.     Please read over the following fact sheets that you were given:    MRSA Information   __X__ Take these medicines the morning of surgery with A SIP OF WATER:     1. cetirizine (ZYRTEC) 10 MG tablet  2. esomeprazole (NEXIUM) 40 MG capsule  3. gabapentin (NEURONTIN) 800 MG tablet  4. metoprolol succinate (TOPROL-XL) 100 MG 24 hr tablet  5. Oxycodone HCl 10 MG TABS if needed  6. meclizine (ANTIVERT) 25 MG tablet if needed  7. diazepam (VALIUM) 5 MG tablet OR ALPRAZolam (XANAX) 0.5 MG tablet if needed  8. cyclobenzaprine (FLEXERIL) 10 MG tablet if needed     __X__ Use CHG Soap as directed    __X__ Stop Metformin 2 days prior to surgery. Last dose will be on Friday 01/15/19.      __X__ Take 1/2 of usual insulin dose the night before surgery. No insulin the morning          of surgery.  __X__ Stop Blood Thinners Effient & Aspirin according to Dr. Alveria Apley instructions.    __X__ Stop Anti-inflammatories 7 days before surgery such as Advil, Ibuprofen, Motrin, BC or Goodies Powder, Naprosyn, Naproxen, Aleve, Aspirin, Meloxicam. May take Tylenol if needed for pain or discomfort or Oxycodone HCl 10 MG TABS.     __X__ Don't start taking any new herbal supplements before your procedure.    __X__ Stop taking the following herbal supplements 7 days before your procedure: (Last dose will be on Monday 01/11/19.)   Omega-3 Fatty Acids (FISH OIL) 1200 MG CAPS

## 2019-01-15 ENCOUNTER — Other Ambulatory Visit: Payer: Self-pay

## 2019-01-15 ENCOUNTER — Other Ambulatory Visit
Admission: RE | Admit: 2019-01-15 | Discharge: 2019-01-15 | Disposition: A | Discharge status: Home / Self Care | Payer: Medicare Other | Source: Ambulatory Visit | Attending: Neurosurgery | Admitting: Neurosurgery

## 2019-01-15 DIAGNOSIS — Z01812 Encounter for preprocedural laboratory examination: Secondary | ICD-10-CM | POA: Insufficient documentation

## 2019-01-15 DIAGNOSIS — Z20828 Contact with and (suspected) exposure to other viral communicable diseases: Secondary | ICD-10-CM | POA: Diagnosis not present

## 2019-01-15 LAB — SARS CORONAVIRUS 2 (TAT 6-24 HRS): SARS Coronavirus 2: NEGATIVE

## 2019-01-18 ENCOUNTER — Ambulatory Visit
Admission: RE | Admit: 2019-01-18 | Discharge: 2019-01-18 | Disposition: A | Discharge status: Home / Self Care | Payer: Medicare Other | Attending: Neurosurgery | Admitting: Neurosurgery

## 2019-01-18 ENCOUNTER — Encounter: Payer: Self-pay | Admitting: *Deleted

## 2019-01-18 ENCOUNTER — Ambulatory Visit: Payer: Medicare Other | Admitting: Certified Registered Nurse Anesthetist

## 2019-01-18 ENCOUNTER — Encounter
Admission: RE | Disposition: A | Discharge status: Home / Self Care | Payer: Self-pay | Source: Home / Self Care | Attending: Neurosurgery

## 2019-01-18 DIAGNOSIS — I10 Essential (primary) hypertension: Secondary | ICD-10-CM | POA: Insufficient documentation

## 2019-01-18 DIAGNOSIS — Z794 Long term (current) use of insulin: Secondary | ICD-10-CM | POA: Diagnosis not present

## 2019-01-18 DIAGNOSIS — Z833 Family history of diabetes mellitus: Secondary | ICD-10-CM | POA: Diagnosis not present

## 2019-01-18 DIAGNOSIS — I2510 Atherosclerotic heart disease of native coronary artery without angina pectoris: Secondary | ICD-10-CM | POA: Insufficient documentation

## 2019-01-18 DIAGNOSIS — Z809 Family history of malignant neoplasm, unspecified: Secondary | ICD-10-CM | POA: Insufficient documentation

## 2019-01-18 DIAGNOSIS — G4730 Sleep apnea, unspecified: Secondary | ICD-10-CM | POA: Insufficient documentation

## 2019-01-18 DIAGNOSIS — Z955 Presence of coronary angioplasty implant and graft: Secondary | ICD-10-CM | POA: Diagnosis not present

## 2019-01-18 DIAGNOSIS — J449 Chronic obstructive pulmonary disease, unspecified: Secondary | ICD-10-CM | POA: Insufficient documentation

## 2019-01-18 DIAGNOSIS — Z7982 Long term (current) use of aspirin: Secondary | ICD-10-CM | POA: Diagnosis not present

## 2019-01-18 DIAGNOSIS — Z79899 Other long term (current) drug therapy: Secondary | ICD-10-CM | POA: Diagnosis not present

## 2019-01-18 DIAGNOSIS — Z8719 Personal history of other diseases of the digestive system: Secondary | ICD-10-CM | POA: Diagnosis not present

## 2019-01-18 DIAGNOSIS — Z981 Arthrodesis status: Secondary | ICD-10-CM | POA: Insufficient documentation

## 2019-01-18 DIAGNOSIS — E1140 Type 2 diabetes mellitus with diabetic neuropathy, unspecified: Secondary | ICD-10-CM | POA: Insufficient documentation

## 2019-01-18 DIAGNOSIS — Z8249 Family history of ischemic heart disease and other diseases of the circulatory system: Secondary | ICD-10-CM | POA: Diagnosis not present

## 2019-01-18 DIAGNOSIS — G894 Chronic pain syndrome: Secondary | ICD-10-CM | POA: Insufficient documentation

## 2019-01-18 DIAGNOSIS — Z419 Encounter for procedure for purposes other than remedying health state, unspecified: Secondary | ICD-10-CM

## 2019-01-18 DIAGNOSIS — H8109 Meniere's disease, unspecified ear: Secondary | ICD-10-CM | POA: Insufficient documentation

## 2019-01-18 DIAGNOSIS — E785 Hyperlipidemia, unspecified: Secondary | ICD-10-CM | POA: Insufficient documentation

## 2019-01-18 DIAGNOSIS — K219 Gastro-esophageal reflux disease without esophagitis: Secondary | ICD-10-CM | POA: Diagnosis not present

## 2019-01-18 DIAGNOSIS — I252 Old myocardial infarction: Secondary | ICD-10-CM | POA: Insufficient documentation

## 2019-01-18 DIAGNOSIS — M5030 Other cervical disc degeneration, unspecified cervical region: Secondary | ICD-10-CM | POA: Diagnosis not present

## 2019-01-18 DIAGNOSIS — E7800 Pure hypercholesterolemia, unspecified: Secondary | ICD-10-CM | POA: Diagnosis not present

## 2019-01-18 HISTORY — PX: PULSE GENERATOR IMPLANT: SHX5370

## 2019-01-18 LAB — GLUCOSE, CAPILLARY
Glucose-Capillary: 163 mg/dL — ABNORMAL HIGH (ref 70–99)
Glucose-Capillary: 170 mg/dL — ABNORMAL HIGH (ref 70–99)

## 2019-01-18 LAB — PROTIME-INR
INR: 1 (ref 0.8–1.2)
Prothrombin Time: 12.6 seconds (ref 11.4–15.2)

## 2019-01-18 LAB — APTT: aPTT: 28 seconds (ref 24–36)

## 2019-01-18 LAB — ABO/RH: ABO/RH(D): O POS

## 2019-01-18 SURGERY — UNILATERAL PULSE GENERATOR IMPLANT
Anesthesia: General | Site: Back

## 2019-01-18 MED ORDER — BUPIVACAINE-EPINEPHRINE (PF) 0.5% -1:200000 IJ SOLN
INTRAMUSCULAR | Status: DC | PRN
  Administered 2019-01-18: 10 mL

## 2019-01-18 MED ORDER — ONDANSETRON HCL 4 MG/2ML IJ SOLN
INTRAMUSCULAR | Status: DC | PRN
  Administered 2019-01-18: 4 mg via INTRAVENOUS

## 2019-01-18 MED ORDER — FENTANYL CITRATE (PF) 100 MCG/2ML IJ SOLN
INTRAMUSCULAR | Status: AC
  Filled 2019-01-18: qty 2

## 2019-01-18 MED ORDER — CEFAZOLIN SODIUM-DEXTROSE 2-4 GM/100ML-% IV SOLN
2.0000 g | INTRAVENOUS | Status: AC
  Administered 2019-01-18: 08:00:00 2 g via INTRAVENOUS

## 2019-01-18 MED ORDER — MIDAZOLAM HCL 2 MG/2ML IJ SOLN
INTRAMUSCULAR | Status: DC | PRN
  Administered 2019-01-18: 2 mg via INTRAVENOUS

## 2019-01-18 MED ORDER — PROPOFOL 500 MG/50ML IV EMUL
INTRAVENOUS | Status: DC | PRN
  Administered 2019-01-18: 25 ug/kg/min via INTRAVENOUS

## 2019-01-18 MED ORDER — MIDAZOLAM HCL 2 MG/2ML IJ SOLN
INTRAMUSCULAR | Status: AC
  Filled 2019-01-18: qty 2

## 2019-01-18 MED ORDER — SODIUM CHLORIDE 0.9 % IV SOLN
INTRAVENOUS | Status: DC
  Administered 2019-01-18: 07:00:00 via INTRAVENOUS

## 2019-01-18 MED ORDER — OXYCODONE HCL 5 MG/5ML PO SOLN: 5.0000 mg | Freq: Once | ORAL | Status: DC | PRN

## 2019-01-18 MED ORDER — BUPIVACAINE HCL (PF) 0.5 % IJ SOLN
INTRAMUSCULAR | Status: AC
  Filled 2019-01-18: qty 30

## 2019-01-18 MED ORDER — ONDANSETRON HCL 4 MG/2ML IJ SOLN
INTRAMUSCULAR | Status: AC
  Filled 2019-01-18: qty 2

## 2019-01-18 MED ORDER — EPINEPHRINE PF 1 MG/ML IJ SOLN
INTRAMUSCULAR | Status: AC
  Filled 2019-01-18: qty 1

## 2019-01-18 MED ORDER — VANCOMYCIN HCL 1000 MG IV SOLR
INTRAVENOUS | Status: AC
  Filled 2019-01-18: qty 1000

## 2019-01-18 MED ORDER — OXYCODONE HCL 5 MG PO TABS: 5.0000 mg | ORAL_TABLET | Freq: Once | ORAL | Status: DC | PRN

## 2019-01-18 MED ORDER — CEFAZOLIN SODIUM-DEXTROSE 2-4 GM/100ML-% IV SOLN
INTRAVENOUS | Status: AC
  Filled 2019-01-18: qty 100

## 2019-01-18 MED ORDER — PROPOFOL 500 MG/50ML IV EMUL
INTRAVENOUS | Status: AC
  Filled 2019-01-18: qty 50

## 2019-01-18 MED ORDER — FENTANYL CITRATE (PF) 100 MCG/2ML IJ SOLN: 25.0000 ug | INTRAMUSCULAR | Status: DC | PRN

## 2019-01-18 SURGICAL SUPPLY — 50 items
BLADE PHOTON ILLUMINATED (MISCELLANEOUS) ×3 IMPLANT
BUR NEURO DRILL SOFT 3.0X3.8M (BURR) ×3 IMPLANT
CANISTER SUCT 1200ML W/VALVE (MISCELLANEOUS) ×3 IMPLANT
CHLORAPREP W/TINT 26 (MISCELLANEOUS) ×6 IMPLANT
COUNTER NEEDLE 20/40 LG (NEEDLE) ×3 IMPLANT
COVER LIGHT HANDLE STERIS (MISCELLANEOUS) ×6 IMPLANT
COVER WAND RF STERILE (DRAPES) ×3 IMPLANT
CUP MEDICINE 2OZ PLAST GRAD ST (MISCELLANEOUS) ×3 IMPLANT
DERMABOND ADVANCED (GAUZE/BANDAGES/DRESSINGS)
DERMABOND ADVANCED .7 DNX12 (GAUZE/BANDAGES/DRESSINGS) IMPLANT
DEVICE IMPLANT NEUROSTIMINATOR (Neuro Prosthesis/Implant) ×1 IMPLANT
DEVICE IMPLANT NEUROSTIMULATOR (Neuro Prosthesis/Implant) ×2 IMPLANT
DRAPE C-ARMOR (DRAPES) IMPLANT
DRAPE INCISE IOBAN 66X45 STRL (DRAPES) ×3 IMPLANT
DRAPE LAPAROTOMY 77X122 PED (DRAPES) ×3 IMPLANT
DRAPE SURG 17X11 SM STRL (DRAPES) ×3 IMPLANT
DRSG TEGADERM 4X4.75 (GAUZE/BANDAGES/DRESSINGS) IMPLANT
DRSG TELFA 4X3 1S NADH ST (GAUZE/BANDAGES/DRESSINGS) IMPLANT
ELECT CAUTERY BLADE TIP 2.5 (TIP) ×3
ELECT REM PT RETURN 9FT ADLT (ELECTROSURGICAL) ×3
ELECTRODE CAUTERY BLDE TIP 2.5 (TIP) ×1 IMPLANT
ELECTRODE REM PT RTRN 9FT ADLT (ELECTROSURGICAL) ×1 IMPLANT
FEE INTRAOP MONITOR IMPULS NCS (MISCELLANEOUS) IMPLANT
GAUZE SPONGE 4X4 12PLY STRL (GAUZE/BANDAGES/DRESSINGS) ×3 IMPLANT
GLOVE BIOGEL PI IND STRL 7.0 (GLOVE) ×1 IMPLANT
GLOVE BIOGEL PI INDICATOR 7.0 (GLOVE) ×2
GLOVE INDICATOR 8.0 STRL GRN (GLOVE) ×3 IMPLANT
GLOVE SURG SYN 7.0 (GLOVE) ×6 IMPLANT
GLOVE SURG SYN 8.0 (GLOVE) ×6 IMPLANT
GOWN STRL REUS W/ TWL LRG LVL3 (GOWN DISPOSABLE) ×1 IMPLANT
GOWN STRL REUS W/ TWL XL LVL3 (GOWN DISPOSABLE) ×1 IMPLANT
GOWN STRL REUS W/TWL LRG LVL3 (GOWN DISPOSABLE) ×2
GOWN STRL REUS W/TWL XL LVL3 (GOWN DISPOSABLE) ×2
GRADUATE 1200CC STRL 31836 (MISCELLANEOUS) ×3 IMPLANT
INTRAOP MONITOR FEE IMPULS NCS (MISCELLANEOUS)
INTRAOP MONITOR FEE IMPULSE (MISCELLANEOUS)
KIT TURNOVER KIT A (KITS) ×3 IMPLANT
MARKER SKIN DUAL TIP RULER LAB (MISCELLANEOUS) ×3 IMPLANT
NEEDLE HYPO 22GX1.5 SAFETY (NEEDLE) ×3 IMPLANT
NS IRRIG 1000ML POUR BTL (IV SOLUTION) ×3 IMPLANT
PACK LAMINECTOMY NEURO (CUSTOM PROCEDURE TRAY) ×3 IMPLANT
RECHARGER INTELLIS (NEUROSURGERY SUPPLIES) ×3 IMPLANT
REPROGRAMMER INTELLIS (NEUROSURGERY SUPPLIES) ×3 IMPLANT
SUT ETHILON 3-0 FS-10 30 BLK (SUTURE)
SUT POLYSORB 2-0 5X18 GS-10 (SUTURE) ×9 IMPLANT
SUT SILK 2 0 PERMA HAND 18 BK (SUTURE) IMPLANT
SUT SILK 2 0 SH (SUTURE) ×3 IMPLANT
SUTURE EHLN 3-0 FS-10 30 BLK (SUTURE) IMPLANT
SYR BULB IRRIG 60ML STRL (SYRINGE) ×3 IMPLANT
TOWEL OR 17X26 4PK STRL BLUE (TOWEL DISPOSABLE) ×6 IMPLANT

## 2019-01-18 NOTE — Op Note (Signed)
Operative Note  SURGERY DATE:01/18/2019  PRE-OP DIAGNOSIS: Chronic Pain Syndrome(G89.4)  POST-OP DIAGNOSIS:Chronic Pain Syndrome(G89.4)  Procedure(s) with comments: Replacement Right Flank Pulse Generator  SURGEON:  * Malen Gauze, MD Marin Olp, [place] Assistant  ANESTHESIA:General  OPERATIVE FINDINGS:Failed Spinal Cord Stimulator Battery  Indications: Kenneth Osborn was seen in clinic on 11/17 and found to have inadequate coverage of back and leg pain with current spinal cord stimulator. The patient had good control of symptoms when it was placed. Xrays confirmed good lead placement. We discussed battery replacement only to control pain and return SCS to functioning status. The risks including hematoma, infection, damage to spinal cord stimulator wires, failure of relief of pain, and need for revision surgery were discussed. The patient elected to proceed with the battery exchange  Procedure:  The patient was brought to the operating room and placed with the right side up in a lateral position on a standard OR table. The anesthesia service had vascular access and provided sedation medications to the patient.  The right flank incision was prepped and draped in a sterile fashion and the previous incision marked.  A hard timeout was performed.  Local anesthetic was instilled into the incision.   Incision was opened sharply to the subcutaneous layer.  The photonblade was then used to dissect to the battery and the width of this was exposed taking care not to damage the wires.  The battery was removed and the pocket and the electrodes removed from the battery.  Wires were seen to be intact without any obvious damage and the scar tissue was removed using blunt dissection.  The battery pocket was expanded to accommodate the new battery.  Hemostasis was obtained and the battery pocket was irrigated with saline.  Electrodes were attached to the new battery and interrogated.  The impedences were found to be normal.  Electrodes were secured and tightened.  The battery was replaced into the pocket and secured with a 2-0 silk suture.    The deep tissue was closed with 0 Vicryl and subcutaneous with 2-0 Vicryl.  The skin was closed with surgical adhesive.  A dressing was applied.  Patient was turned to the supine position and sedation medications were stopped and the patient was awoken from anesthesia.  Patient was taken to the PACU for further recovery and seen to be at neurologic baseline.  I discussed the results with the family        ESTIMATED BLOOD LOSS: 5cc   IMPLANT Medtronic [id number] Surescan intellis pulse generator SN [id number]  SPECIMEN Explanted pulse generator  I performed the case in its entiretywith assistance of PA, Corrie Mckusick, [place]

## 2019-01-18 NOTE — Anesthesia Post-op Follow-up Note (Signed)
Anesthesia QCDR form completed.        

## 2019-01-18 NOTE — Anesthesia Preprocedure Evaluation (Signed)
Anesthesia Evaluation  Patient identified by MRN, date of birth, ID band Patient awake    Reviewed: Allergy & Precautions, H&P , NPO status , Patient's Chart, lab work & pertinent test results  History of Anesthesia Complications Negative for: history of anesthetic complications  Airway Mallampati: III  TM Distance: <3 FB Neck ROM: limited    Dental  (+) Chipped, Poor Dentition   Pulmonary sleep apnea , COPD,           Cardiovascular Exercise Tolerance: Good hypertension, (-) angina+ CAD, + Past MI and + Cardiac Stents  (-) DOE      Neuro/Psych  Headaches, PSYCHIATRIC DISORDERS  Neuromuscular disease    GI/Hepatic Neg liver ROS, GERD  Medicated and Controlled,  Endo/Other  diabetes, Type 2, Insulin Dependent  Renal/GU Renal disease  negative genitourinary   Musculoskeletal   Abdominal   Peds  Hematology negative hematology ROS (+)   Anesthesia Other Findings Past Medical History: 12/30/2012: Allergic rhinitis No date: Bronchitis     Comment:  hx of 08/12/2014: Can't get food down No date: Chronic back pain     Comment:  thoracic area 09/2015: Concussion No date: COPD (chronic obstructive pulmonary disease) (HCC) No date: Coronary artery disease     Comment:  99% blockage No date: DDD (degenerative disc disease), cervical No date: Dehydration symptoms     Comment:  2019 No date: Diabetes mellitus without complication (HCC)     Comment:  insulin dependent No date: Dysphagia No date: GERD (gastroesophageal reflux disease) 12/21/2014: History of Meniere's disease 12/21/2014: History of thoracic spine surgery (S/P T9-10 IVD spacer) No date: Hypercholesteremia No date: Hyperlipidemia No date: Hypertension     Comment:  sees Dr. Jenny Reichmann [facility] No date: Meniere's disease     Comment:  deaf in right ear, takes diazepam No date: Myocardial infarction [facility])     Comment:  Sees Dr. Drema Dallas, [facility] No date: Neuromuscular disorder [facility])     Comment:  diabetic neuropathy in feet No date: Psychosis ([place]) No date: Short-segment Barrett's esophagus No date: Sleep apnea     Comment:  uses cpap and 2 L o2 at hs  Past Surgical History: No date: APPENDECTOMY 01/04/2013: ARTHRODESIS ANTERIOR ANTERIOR CERVICLE SPINE No date: BACK SURGERY     Comment:  fusion thoracic area No date: CARDIAC CATHETERIZATION     Comment:  may 2012 and Nov 20, 2010 06/15/2015: CARDIAC CATHETERIZATION; N/A     Comment:  Procedure: Left Heart Cath and Coronary Angiography;                Surgeon: Corey Skains, MD;  Location: [place]               CV LAB;  Service: Cardiovascular;  Laterality: N/A; 06/15/2015: CARDIAC CATHETERIZATION; N/A     Comment:  Procedure: Coronary Stent Intervention;  Surgeon:               Isaias Cowman, MD;  Location: [place] CV LAB;              Service: Cardiovascular;  Laterality: N/A; No date: CARDIOVASCULAR STRESS TEST     Comment:  jan 2014 09/19/2014: COLONOSCOPY WITH PROPOFOL; N/A     Comment:  Procedure: COLONOSCOPY WITH PROPOFOL;  Surgeon: Manya Silvas, MD;  Location: [facility] ENDOSCOPY;  Service:  Endoscopy;  Laterality: N/A; No date: CORONARY ANGIOPLASTY     Comment:  stent placement 09/19/2014: ESOPHAGOGASTRODUODENOSCOPY; N/A     Comment:  Procedure: ESOPHAGOGASTRODUODENOSCOPY (EGD);  Surgeon:               Manya Silvas, MD;  Location: [facility] ENDOSCOPY;                Service: Endoscopy;  Laterality: N/A; No date: LABRINTHECTOMY     Comment:  1999 right ear No date: mastoid shunt     Comment:  left, 2002, 1997 right ear, 1980 right ear 09/19/2014: SAVORY DILATION; N/A     Comment:  Procedure: SAVORY DILATION;  Surgeon: Manya Silvas,               MD;  Location: [facility];  Service: Endoscopy;                Laterality: N/A; 04/06/2012: SHOULDER ARTHROSCOPY WITH SUBACROMIAL DECOMPRESSION; Left     Comment:   Procedure: SHOULDER ARTHROSCOPY WITH SUBACROMIAL               DECOMPRESSION;  Surgeon: Vickey Huger, MD;  Location: [place];  Service: Orthopedics;  Laterality: Left;  left               shoulder arthroscopy, subacromial decompression and               distal clavicle resection No date: SPINAL CORD STIMULATOR IMPLANT; Right     Reproductive/Obstetrics negative OB ROS                             Anesthesia Physical Anesthesia Plan  ASA: III  Anesthesia Plan: General   Post-op Pain Management:    Induction: Intravenous  PONV Risk Score and Plan: Propofol infusion and TIVA  Airway Management Planned: Natural Airway and Nasal Cannula  Additional Equipment:   Intra-op Plan:   Post-operative Plan:   Informed Consent: I have reviewed the patients History and Physical, chart, labs and discussed the procedure including the risks, benefits and alternatives for the proposed anesthesia with the patient or authorized representative who has indicated his/her understanding and acceptance.     Dental Advisory Given  Plan Discussed with: Anesthesiologist, CRNA and Surgeon  Anesthesia Plan Comments: (Patient consented for risks of anesthesia including but not limited to:  - adverse reactions to medications - risk of intubation if required - damage to teeth, lips or other oral mucosa - sore throat or hoarseness - Damage to heart, brain, lungs or loss of life  Patient voiced understanding.)        Anesthesia Quick Evaluation

## 2019-01-18 NOTE — Discharge Instructions (Addendum)
NEUROSURGERY DISCHARGE INSTRUCTIONS  The following are instructions to help in your recovery once you have been discharged from the hospital. Even if you feel well, it is important that you follow these activity guidelines.  What to do after you leave the hospital:  Recommended diet:  Increase protein intake to promote wound healing. You may return to your usual diet. However, you may experience discomfort when swallowing in the first month after your surgery. This is normal. You may find that softer foods are more comfortable for you to swallow. Be sure to stay hydrated.   Recommended activity:  Increase physical activity slowly as tolerated. Taking short walks is encouraged, but avoid strenuous exercise. Do not jog, run, bicycle, lift weights, or participate in any other exercises unless specifically allowed by your doctor.   You should not drive until cleared by your doctor.   Until released by your doctor, you should not return to work or school. You should rest at home and let your body heal.   You may shower the day after your surgery. After showering, lightly dab your incision dry. Do not take a tub bath or go swimming until approved by your doctor at your follow-up appointment.   If you smoke, we strongly recommend that you quit. Smoking has been proven to interfere with normal bone healing and will dramatically reduce the success rate of your surgery. Please contact [facility] (800-QUIT-NOW) and use the resources at www.[facility].com for assistance in stopping smoking.   Medications  Do not restart Aspirin until seven days after surgery  * Do not take anti-inflammatory medications for 3 days after surgery (naproxen [Aleve], ibuprofen [Advil, Motrin], celecoxib [Celebrex], etc.).   You may restart home medications.   Wound Care Instructions  If you have a dressing on your incision, remove it two days after your surgery. Keep your incision area clean and dry.   If you have  staples or stitches on your incision, you should have a follow up scheduled for removal. If you do not have staples or stitches, you will have steri-strips (small pieces of surgical tape) or Dermabond glue. The steri-strips/glue should begin to peel away within about a week (it is fine if the steri-strips fall off before then). If the strips are still in place one week after your surgery, you may gently remove them.    Please Report any of the following: Should you experience any of the following, contact us immediately:   New numbness or weakness   Pain that is progressively getting worse, and is not relieved by your pain medication, muscle relaxers, rest, and warm compresses   Bleeding, redness, swelling, pain, or drainage from surgical incision   Chills or flu-like symptoms   Fever greater than 101.0 F (38.3 C)   Inability to eat, drink fluids, or take medications   Problems with bowel or bladder functions   Difficulty breathing or shortness of breath   Warmth, tenderness, or swelling in your calf    Additional Follow up appointments During office hours (Monday-Friday 9 am to 5 pm), please call your physician at [phone] and ask for Berdine Addison.   After hours and weekends, please call the [place] Operator at [phone] and ask for the Neurosurgery Resident On Call   For a life-threatening emergency, call [place]   1) The drugs that you were given will stay in your system until tomorrow so for the next 24 hours you should not:  A)  Drive an automobile B) Make any legal decisions C) Drink any alcoholic beverage   2) You may resume regular meals tomorrow.  Today it is better to start with liquids and gradually work up to solid foods.  You may eat anything you prefer, but it is better to start with liquids, then soup and crackers, and gradually work up to solid foods.   3) Please notify your doctor immediately if you have  any unusual bleeding, trouble breathing, redness and pain at the surgery site, drainage, fever, or pain not relieved by medication.    4) Additional Instructions:        Please contact your physician with any problems or Same Day Surgery at [phone], Monday through Friday 6 am to 4 pm, or [place] at [facility] number at [phone].

## 2019-01-18 NOTE — Discharge Summary (Signed)
Procedure: spinal cord stimulator battery replacement Procedure date: 01/18/2019 Diagnosis: Chronic pain syndrome   History: CLEMENTE DEWEY is s/p spinal; cord stimulator battery replace for chronic pain syndrome  POD: Tolerated procedure well. Evaluated in post op recovery. Rep present, performing adjustments on device. Battery working appropriately. No complaints at this time.   Physical Exam: Vitals:   01/18/19 0619  BP: 121/83  Pulse: 86  Resp: 18  Temp: (!) 97 F (36.1 C)  SpO2: 95%   Strength:5/5 IS, DF, Quad Sensation: intact and symmetric throughout lower extremities Skin: Dressing clean and dry.   Data:  No results for input(s): NA, K, CL, CO2, BUN, CREATININE, LABGLOM, GLUCOSE, CALCIUM in the last 168 hours. No results for input(s): AST, ALT, ALKPHOS in the last 168 hours.  Invalid input(s): TBILI   No results for input(s): WBC, HGB, HCT, PLT in the last 168 hours. No results for input(s): APTT, INR in the last 168 hours.       Other tests/results: No imaging reviewed  Assessment/Plan:  QUADARIUS HENTON is POD0 s/p spinal cord stimulator battery replacement for chronic pain syndrome. Tolerated procedure well. Battery turned on in PACU without issue. He is scheduled to follow up in clinic in approximately 2 weeks. Advised to contact office if any questions or concerns arise before then.   Marin Olp PA-C Department of Neurosurgery

## 2019-01-18 NOTE — Anesthesia Postprocedure Evaluation (Signed)
Anesthesia Post Note  Patient: Kenneth Osborn  Procedure(s) Performed: [employer]TR[employer]IC SPINAL CORD STIMULATOR BATTERY EXCHANGE (N/A Back)  Patient location during evaluation: PACU Anesthesia Type: General Level of consciousness: awake and alert Pain management: pain level controlled Vital Signs Assessment: post-procedure vital signs reviewed and stable Respiratory status: spontaneous breathing, nonlabored ventilation, respiratory function stable and patient connected to nasal cannula oxygen Cardiovascular status: blood pressure returned to baseline and stable Postop Assessment: no apparent nausea or vomiting Anesthetic complications: no     Last Vitals:  Vitals:   01/18/19 0901 01/18/19 0915  BP: 122/80 122/78  Pulse: 71 70  Resp: 16 16  Temp: 36.6 C   SpO2: 95% 96%    Last Pain:  Vitals:   01/18/19 0915  TempSrc:   PainSc: 0-No pain                 Precious Haws [place]

## 2019-01-18 NOTE — H&P (Signed)
Kenneth Osborn is an 63 y.o. male.   Chief Complaint: chronic back and leg pain HPI: [name] was last seen in September with a nonfunctioning spinal cord stimulator that he says was also painful around the battery pocket. He was hoping to get a MRI of his spine but given the incompatibility from the stimulator, we discussed removal. He has worked with the representative and gotten the stimulator to work again but he is not getting any relief from it. He was considering a battery replacement as he continues to deal with the pain in the back and the legs. He does feel like the pain is more bilateral at this time. He continues to have some neck pain additionally. However, the back pain is more bothersome at this time. He does have some intermittent numbness in his lower extremities. Given these factors, he would like to proceed with a battery replacement to see if we can get relief   Past Medical History:  Diagnosis Date  . Allergic rhinitis 12/30/2012  . Bronchitis    hx of  . Can't get food down 08/12/2014  . Chronic back pain    thoracic area  . Concussion 09/2015  . COPD (chronic obstructive pulmonary disease) ([place])   . Coronary artery disease    99% blockage  . DDD (degenerative disc disease), cervical   . Dehydration symptoms    2019  . 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 Dysphagia   . GERD (gastroesophageal reflux disease)   . History of Meniere's disease 12/21/2014  . History of thoracic spine surgery (S/P T9-10 IVD spacer) 12/21/2014  . Hypercholesteremia   . Hyperlipidemia   . Hypertension    sees Dr. Jenny Reichmann walker Jefm Bryant  . 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 Myocardial infarction [facility])    Sees Dr. Drema [place], [place] [facility]  . Neuromuscular disorder ([place])    diabetic neuropathy in feet  . Psychosis ([place])   . Short-segment Barrett's esophagus   . Sleep apnea    uses cpap and 2 L o2 at hs    Past Surgical History:   Procedure Laterality Date  . APPENDECTOMY    . ARTHRODESIS ANTERIOR ANTERIOR CERVICLE SPINE  01/04/2013  . BACK SURGERY     fusion thoracic area  . CARDIAC CATHETERIZATION     may 2012 and Nov 20, 2010  . CARDIAC CATHETERIZATION N/A 06/15/2015   Procedure: Left Heart Cath and Coronary Angiography;  Surgeon: Corey Skains, MD;  Location: [facility] CV LAB;  Service: Cardiovascular;  Laterality: N/A;  . CARDIAC CATHETERIZATION N/A 06/15/2015   Procedure: Coronary Stent Intervention;  Surgeon: Isaias Cowman, MD;  Location: [facility] [place] [facility];  Service: Cardiovascular;  Laterality: N/A;  . CARDIOVASCULAR STRESS TEST     jan 2014  . COLONOSCOPY WITH PROPOFOL N/A 09/19/2014   Procedure: COLONOSCOPY WITH PROPOFOL;  Surgeon: Manya Silvas, MD;  Location: [facility] ENDOSCOPY;  Service: Endoscopy;  Laterality: N/A;  . CORONARY ANGIOPLASTY     stent placement  . ESOPHAGOGASTRODUODENOSCOPY N/A 09/19/2014   Procedure: ESOPHAGOGASTRODUODENOSCOPY (EGD);  Surgeon: Manya Silvas, MD;  Location: [facility] ENDOSCOPY;  Service: Endoscopy;  Laterality: N/A;  . [place] right ear  . mastoid shunt     left, 2002, 1997 right ear, 1980 right ear  . SAVORY DILATION N/A 09/19/2014   Procedure: SAVORY DILATION;  Surgeon: Manya Silvas, MD;  Location: [facility] ENDOSCOPY;  Service: Endoscopy;  Laterality: N/A;  . SHOULDER ARTHROSCOPY WITH SUBACROMIAL DECOMPRESSION Left 04/06/2012   Procedure: SHOULDER ARTHROSCOPY WITH SUBACROMIAL DECOMPRESSION;  Surgeon: Vickey Huger, MD;  Location: [facility];  Service: Orthopedics;  Laterality: Left;  left shoulder arthroscopy, subacromial decompression and distal clavicle resection  . SPINAL CORD STIMULATOR IMPLANT Right     Family History  Problem Relation Age of Onset  . Heart disease Mother   . Diabetes Mother   . Heart disease Father   . Cancer Sister   . Heart disease Maternal Aunt   . Heart disease Maternal Uncle   . Diabetes Maternal Grandmother   .  Diabetes Paternal Grandmother    Social History:  reports that he has never smoked. He has never used smokeless tobacco. He reports that he does not drink alcohol or use drugs.  Allergies: No Known Allergies  Medications Prior to Admission  Medication Sig Dispense Refill  . acetaminophen (TYLENOL) 500 MG tablet Take 1,000 mg by mouth every 6 (six) hours as needed for moderate pain or headache.    . ALPRAZolam (XANAX) 0.5 MG tablet Take 0.5 mg by mouth 3 (three) times daily as needed (Meniere's disease).     Marland Kitchen aspirin EC 81 MG tablet Take 81 mg by mouth at bedtime.     . cetirizine (ZYRTEC) 10 MG tablet Take 10 mg by mouth daily.     . cyclobenzaprine (FLEXERIL) 10 MG tablet Take 1 tablet (10 mg total) by mouth 3 (three) times daily as needed for muscle spasms. 270 tablet 1  . diazepam (VALIUM) 5 MG tablet Take 5 mg by mouth 3 (three) times daily as needed (Meniere's disease).     Marland Kitchen diltiazem (CARDIZEM CD) 240 MG 24 hr capsule Take 240 mg by mouth daily.     . enalapril (VASOTEC) 10 MG tablet Take 10 mg by mouth 2 (two) times daily.     Marland Kitchen esomeprazole (NEXIUM) 40 MG capsule Take 40 mg by mouth 2 (two) times daily.    Marland Kitchen ezetimibe (ZETIA) 10 MG tablet Take 10 mg by mouth at bedtime.     . gabapentin (NEURONTIN) 800 MG tablet Take 2 tablets (1,600 mg total) by mouth 2 (two) times daily. 360 tablet 1  . HUMULIN R U-500 KWIKPEN 500 UNIT/ML kwikpen Inject 40 Units into the skin 3 (three) times daily with meals. (Patient taking differently: Inject 65-75 Units into the skin See admin instructions. Can adjust according to blood sugar Inject 75 units with breakfast, 75 units with lunch, 65 units with dinner)  0  . isosorbide mononitrate (IMDUR) 30 MG 24 hr tablet Take 30 mg by mouth at bedtime.    . Magnesium Oxide 500 MG TABS Take 1 tablet (500 mg total) by mouth daily. 90 tablet 1  . meclizine (ANTIVERT) 25 MG tablet Take 25 mg by mouth 3 (three) times daily as needed for dizziness.     . metFORMIN  (GLUCOPHAGE) 1000 MG tablet Take 1,000 mg by mouth 2 (two) times daily with a meal.    . metoprolol succinate (TOPROL-XL) 100 MG 24 hr tablet Take 100 mg by mouth 2 (two) times daily. Take with or immediately following a meal.    . montelukast (SINGULAIR) 10 MG tablet Take 10 mg by mouth at bedtime.     . naloxone (NARCAN) nasal spray 4 mg/0.1 mL Place 1 spray into the nose daily as needed (opioid overdose).    . niacin (NIASPAN) 500 MG CR tablet Take 500 mg by  mouth at bedtime.    . nitroGLYCERIN (NITROSTAT) 0.4 MG SL tablet Place 0.4 mg under the tongue every 5 (five) minutes as needed for chest pain.    . Omega-3 Fatty Acids (FISH OIL) 1200 MG CAPS Take 1,200 mg by mouth 2 (two) times daily.    . Oxycodone HCl 10 MG TABS Take 1 tablet (10 mg total) by mouth every 6 (six) hours as needed. Must last 30 days (Patient taking differently: Take 10 mg by mouth every 6 (six) hours as needed (pain). Must last 30 days) 120 tablet 0  . prasugrel (EFFIENT) 10 MG TABS Take 10 mg by mouth daily.     . rosuvastatin (CRESTOR) 40 MG tablet Take 40 mg by mouth at bedtime.    . Glucagon (BAQSIMI ONE PACK NA) Place 1 Dose into the nose daily as needed (severely low blood sugar).      Results for orders placed or performed during the hospital encounter of 01/18/19 (from the past 48 hour(s))  Glucose, capillary     Status: Abnormal   Collection Time: 01/18/19  6:14 AM  Result Value Ref Range   Glucose-Capillary 163 (H) 70 - 99 mg/dL   No results found.  ROS General ROS: Negative Respiratory ROS: Negative Cardiovascular ROS: Negative Gastrointestinal ROS: Negative Genito-Urinary ROS: Negative Musculoskeletal ROS: Positive for back pain Neurological ROS: Positive for leg pain Dermatological ROS: Negative  Blood pressure 121/83, pulse 86, temperature (!) 97 F (36.1 C), temperature source Tympanic, resp. rate 18, SpO2 95 %. Physical Exam  General appearance: Alert, cooperative, in no acute distress CV:  Regular rate Pulm: Clear to auscultation  Neurologic exam:  Mental status: alertness: alert, affect: normal Speech: fluent and clear Motor:strength symmetric 5/5 bilateral lower extremities Sensory: intact to light touch in bilateral lower extremity at this time Gait: normal   Assessment/Plan We will proceed with replacement of the pulse generator today  [name], MD 01/18/2019, 6:37 AM

## 2019-01-18 NOTE — Transfer of Care (Signed)
Immediate Anesthesia Transfer of Care Note  Patient: Kenneth Osborn  Procedure(s) Performed: [employer] SPINAL CORD STIMULATOR BATTERY EXCHANGE (N/A Back)  Patient Location: PACU  Anesthesia Type:MAC  Level of Consciousness: sedated  Airway & Oxygen Therapy: Patient connected to face mask oxygen  Post-op Assessment: Post -op Vital signs reviewed and stable  Post vital signs: stable  Last Vitals:  Vitals Value Taken Time  BP 119/81 01/18/19 0823  Temp 36.7 C 01/18/19 0822  Pulse 71 01/18/19 0823  Resp 18 01/18/19 0823  SpO2 99 % 01/18/19 0823    Last Pain:  Vitals:   01/18/19 0619  TempSrc: Tympanic  PainSc: 1          Complications: No apparent anesthesia complications

## 2019-01-18 NOTE — Interval H&P Note (Signed)
History and Physical Interval Note:  01/18/2019 6:39 AM  Kenneth Osborn  has presented today for surgery, with the diagnosis of Chronic pain syndrome G89.4.  The various methods of treatment have been discussed with the patient and family. After consideration of risks, benefits and other options for treatment, the patient has consented to  Procedure(s): [place] (N/A) as a surgical intervention.  The patient's history has been reviewed, patient examined, no change in status, stable for surgery.  I have reviewed the patient's chart and labs.  Questions were answered to the patient's satisfaction.     [name]

## 2019-01-21 ENCOUNTER — Encounter: Payer: Self-pay | Admitting: Pain Medicine

## 2019-01-21 NOTE — Progress Notes (Signed)
Pain relief after procedure (treated area only): (Questions asked to patient) 1. Starting about 15 minutes after the procedure, and "while the area was still numb" (from the local anesthetics), were you having any of your usual pain "in that area" (the treated area)?  (NOTE: NOT including the discomfort from the needle sticks.) First 1 hour: 50 % better. First 4-6 hours: 60 % better. 2. Assuming that it did get numb. How long was the area numb? 60 % benefit, longer than 6 hours. How long? 6 weeks. 3. How much better is your pain now, when compared to before the procedure? Current benefit: unable to determine % better. 4. Can you move better now? Improvement in ROM (Range of Motion): No. 5. Can you do more now? Improvement in function: No. 4. Did you have any problems with the procedure? Side-effects/Complications: No.

## 2019-01-25 ENCOUNTER — Ambulatory Visit: Payer: Medicare Other | Attending: Pain Medicine | Admitting: Pain Medicine

## 2019-01-25 ENCOUNTER — Other Ambulatory Visit: Payer: Self-pay

## 2019-01-25 ENCOUNTER — Encounter: Payer: Self-pay | Admitting: Pain Medicine

## 2019-01-25 DIAGNOSIS — G894 Chronic pain syndrome: Secondary | ICD-10-CM

## 2019-01-25 DIAGNOSIS — M5442 Lumbago with sciatica, left side: Secondary | ICD-10-CM | POA: Diagnosis not present

## 2019-01-25 DIAGNOSIS — M79605 Pain in left leg: Secondary | ICD-10-CM | POA: Diagnosis not present

## 2019-01-25 DIAGNOSIS — M47816 Spondylosis without myelopathy or radiculopathy, lumbar region: Secondary | ICD-10-CM | POA: Diagnosis not present

## 2019-01-25 DIAGNOSIS — G8929 Other chronic pain: Secondary | ICD-10-CM

## 2019-01-25 MED ORDER — OXYCODONE HCL 10 MG PO TABS: 10.0000 mg | ORAL_TABLET | Freq: Four times a day (QID) | ORAL | 0 refills | Status: DC | PRN

## 2019-01-25 NOTE — Progress Notes (Addendum)
Pain Management Virtual Encounter Note - Virtual Visit via Telephone Telehealth (real-time audio visits between healthcare provider and patient).   Patient's Phone No. & Preferred Pharmacy:  [phone] (home); [phone] (mobile); (Preferred) [phone] [email or web address]  [place], [place] - [address] [place] [address] Phone: [phone] Fax: [phone]  [place], [facility] [address] [place] [address] [place] [address] Phone: [phone] Fax: [phone]    Pre-screening note:  Our staff contacted Mr. Shawler and offered him an "in person", "face-to-face" appointment versus a telephone encounter. He indicated preferring the telephone encounter, at this time.   Reason for Virtual Visit: COVID-19*  Social distancing based on CDC and AMA recommendations.   I contacted BREYER TEJERA on 01/25/2019 via telephone.      I clearly identified myself as Gaspar Cola, MD. I verified that I was speaking with the correct person using two identifiers (Name: YERIEL MINEO, and date of birth: [birth year]).  Advanced Informed Consent I sought verbal advanced consent from [name] for virtual visit interactions. I informed Mr. Kunst of possible security and privacy concerns, risks, and limitations associated with providing "not-in-person" medical evaluation and management services. I also informed Mr. Steck of the availability of "in-person" appointments. Finally, I informed him that there would be a charge for the virtual visit and that he could be  personally, fully or partially, financially responsible for it. Mr. Kopka expressed understanding and agreed to proceed.   Historic Elements   Mr. NICHOLE KELTNER is a 63 y.o. year old, male patient evaluated today after his last encounter by our practice on 12/18/2018. Mr. Naramore  has a past medical history of Acute postoperative  pain (12/03/2018), Allergic rhinitis (12/30/2012), Bronchitis, Can't get food down (08/12/2014), Chronic back pain, Concussion (09/2015), COPD (chronic obstructive pulmonary disease) ([place]), Coronary artery disease, DDD (degenerative disc disease), cervical, Dehydration symptoms, Diabetes mellitus without complication ([place]), Dysphagia, GERD (gastroesophageal reflux disease), History of Meniere's disease (12/21/2014), History of thoracic spine surgery (S/P T9-10 IVD spacer) (12/21/2014), Hypercholesteremia, Hyperlipidemia, Hypertension, Meniere's disease, Myocardial infarction ([place]), Neuromuscular disorder ([place]), Psychosis ([place]), Short-segment Barrett's esophagus, and Sleep apnea. He also  has a past surgical history that includes Cardiovascular stress test; Cardiac catheterization; Coronary angioplasty; Labrinthectomy; mastoid shunt; Appendectomy; Back surgery; Shoulder arthroscopy with subacromial decompression (Left, 04/06/2012); ARTHRODESIS ANTERIOR ANTERIOR CERVICLE SPINE (01/04/2013); Colonoscopy with propofol (N/A, 09/19/2014); Esophagogastroduodenoscopy (N/A, 09/19/2014); Savory dilation (N/A, 09/19/2014); Spinal cord stimulator implant (Right); Cardiac catheterization (N/A, 06/15/2015); Cardiac catheterization (N/A, 06/15/2015); and Pulse generator implant (N/A, 01/18/2019). Mr. Shiller has a current medication list which includes the following prescription(s): alprazolam, cetirizine, cyclobenzaprine, diazepam, diltiazem, docusate sodium, enalapril, esomeprazole, ezetimibe, gabapentin, glucagon, humulin r u-500 kwikpen, isosorbide mononitrate, magnesium oxide, meclizine, metformin, metoprolol succinate, montelukast, naloxone, niacin, nitroglycerin, fish oil, oxycodone hcl, prasugrel, and rosuvastatin. He  reports that he has never smoked. He has never used smokeless tobacco. He reports that he does not drink alcohol or use drugs. Mr. Goeden has No Known Allergies.   HPI  Today, he is being contacted for a post-procedure  assessment.  The patient had the spinal cord stimulator battery replacement surgery by Dr. Lysle Morales last week and he seems to be healing well.  He refers having attained good benefit from the radiofrequency ablation, but he still having some postoperative pain.  The patient indicates doing well with the current medication regimen. No adverse reactions or side effects  reported to the medications.  He indicates that the spinal cord stimulator is working well and the surgery fix told problems that he had with the device.  Post-Procedure Evaluation  Procedure: Therapeutic right-sided lumbar facet RFA #1 under fluoroscopic guidance and IV sedation Pre-procedure pain level:  1/10 Post-procedure: 0/10 (100% relief)  Sedation: Sedation provided.  [name], RN  01/25/2019  8:13 AM  Sign when Signing Visit Pain relief after procedure (treated area only): (Questions asked to patient) 1. Starting about 15 minutes after the procedure, and "while the area was still numb" (from the local anesthetics), were you having any of your usual pain "in that area" (the treated area)?  (NOTE: NOT including the discomfort from the needle sticks.) First 1 hour: 50 % better. First 4-6 hours: 60 % better. 2. Assuming that it did get numb. How long was the area numb? 60 % benefit, longer than 6 hours. How long? 6 weeks. 3. How much better is your pain now, when compared to before the procedure? Current benefit: 100 % better. 4. Can you move better now? Improvement in ROM (Range of Motion): yes. 5. Can you do more now? Improvement in function: yes. 4. Did you have any problems with the procedure? Side-effects/Complications: No.   Current benefits: Defined as benefit that persist at this time.   Analgesia:  90-100% better.  When I called him today he indicated having no pain in the area where we did the radiofrequency ablation, just some discomfort from his recent surgery. Function: Mr. Mathenia reports  improvement in function ROM: Mr. Falls reports improvement in ROM  Pharmacotherapy Assessment  Analgesic: Oxycodone IR 10 mg, 1 tab PO q 6 hrs (40 mg/day of oxycodone) MME/day:60 mg/day.   Monitoring: Pharmacotherapy: No side-effects or adverse reactions reported. [place] PMP: PDMP reviewed during this encounter.       Compliance: No problems identified. Effectiveness: Clinically acceptable. Plan: Refer to "POC".  UDS:  Summary  Date Value Ref Range Status  03/18/2018 FINAL  Final    Comment:    ==================================================================== TOXASSURE SELECT 13 (MW) ==================================================================== Test                             Result       Flag       Units Drug Present and Declared for Prescription Verification   Desmethyldiazepam              123          EXPECTED   ng/mg creat   Oxazepam                       >897         EXPECTED   ng/mg creat   Temazepam                      291          EXPECTED   ng/mg creat    Desmethyldiazepam, oxazepam, and temazepam are expected    metabolites of diazepam. Desmethyldiazepam and oxazepam are also    expected metabolites of other drugs, including chlordiazepoxide,    prazepam, clorazepate, and halazepam. Oxazepam is an expected    metabolite of temazepam. Oxazepam and temazepam are also    available as scheduled prescription medications.   Oxycodone  1956         EXPECTED   ng/mg creat   Oxymorphone                    4052         EXPECTED   ng/mg creat   Noroxycodone                   957          EXPECTED   ng/mg creat   Noroxymorphone                 485          EXPECTED   ng/mg creat    Sources of oxycodone are scheduled prescription medications.    Oxymorphone, noroxycodone, and noroxymorphone are expected    metabolites of oxycodone. Oxymorphone is also available as a    scheduled prescription medication. Drug Absent but Declared for Prescription  Verification   Alprazolam                     Not Detected UNEXPECTED ng/mg creat ==================================================================== Test                      Result    Flag   Units      Ref Range   Creatinine              223              mg/dL      >=20 ==================================================================== Declared Medications:  The flagging and interpretation on this report are based on the  following declared medications.  Unexpected results may arise from  inaccuracies in the declared medications.  **Note: The testing scope of this panel includes these medications:  Alprazolam (Xanax)  Diazepam (Valium)  Oxycodone  **Note: The testing scope of this panel does not include following  reported medications:  Aspirin  Cetirizine (Zyrtec)  Cyclobenzaprine (Flexeril)  Diltiazem (Cardizem)  Ezetimibe (Zetia)  Gabapentin (Neurontin)  Insulin (Humulin)  Isosorbide (Imdur)  Magnesium  Meclizine (Antivert)  Metformin (Glucophage)  Metoprolol (Toprol)  Montelukast (Singulair)  Naloxone (Narcan)  Niacin (Niaspan)  Nitroglycerin (Nitrostat)  Omega-3 Fatty Acids (Fish Oil)  Omeprazole (Nexium)  Prasugrel (Effient)  Rosuvastatin (Crestor) ==================================================================== For clinical consultation, please call [phone]. ====================================================================    Laboratory Chemistry Profile (12 mo)  Renal: 01/08/2019: BUN 20; Creatinine, Ser 1.46  Lab Results  Component Value Date   GFRAA 58 (L) 01/08/2019   GFRNONAA 50 (L) 01/08/2019   Hepatic: No results found for requested labs within last 8760 hours. Lab Results  Component Value Date   AST 31 01/18/2018   ALT 33 01/18/2018   Other: No results found for requested labs within last 8760 hours. Note: Above Lab results reviewed.  Imaging  DG PAIN CLINIC C-ARM 1-60 MIN NO REPORT Fluoro was used, but no Radiologist  interpretation will be provided.  Please refer to "NOTES" tab for provider progress note.   Assessment  The primary encounter diagnosis was Chronic pain syndrome. Diagnoses of Chronic low back pain (Primary Area of Pain) (Bilateral) (L>R), Chronic lower extremity pain (Left), and Lumbar facet syndrome (Bilateral) (R>L) were also pertinent to this visit.  Plan of Care  Problem-specific:  No problem-specific Assessment & Plan notes found for this encounter.  I have discontinued [name] "Wayne"'s acetaminophen. I am also having him maintain his diazepam, cetirizine, prasugrel, esomeprazole, niacin, ALPRAZolam, meclizine, montelukast, ezetimibe,  HumuLIN R U-500 KwikPen, isosorbide mononitrate, nitroGLYCERIN, metFORMIN, enalapril, diltiazem, Magnesium Oxide, cyclobenzaprine, gabapentin, Fish Oil, metoprolol succinate, naloxone, rosuvastatin, Glucagon (BAQSIMI ONE PACK NA), Docusate Sodium (STOOL SOFTENER LAXATIVE PO), and Oxycodone HCl.  Pharmacotherapy (Medications Ordered): Meds ordered this encounter  Medications  . Oxycodone HCl 10 MG TABS    Sig: Take 1 tablet (10 mg total) by mouth every 6 (six) hours as needed. Must last 30 days    Dispense:  120 tablet    Refill:  0    Chronic Pain: STOP Act (Not applicable) Fill 1 day early if closed on refill date. Do not fill until: 04/08/2019. To last until: 05/08/2019. Avoid benzodiazepines within 8 hours of opioids   Orders:  No orders of the defined types were placed in this encounter.  Follow-up plan:   Return in about 3 months (around 05/05/2019) for (VV), (MM).      Considering: NOTE: EFFIENT Anticoagulation (Stop: 7-10 days  Re-start: 6 hrs) Therapeutic right-sided lumbar facet RFA #1 (Next)   Palliative PRN treatment(s): Palliative right PSIS MNB/TPI  Diagnostic/palliative right lumbar facet block #3  Diagnostic/palliative left lumbar facet block #2  Palliative left-sided lumbar facet RFA #2 (last done 12/03/2018)       Recent Visits No visits were found meeting these conditions.  Showing recent visits within past 90 days and meeting all other requirements   Today's Visits Date Type Provider Dept  05/05/19 Telemedicine Milinda Pointer, MD [facility]  Showing today's visits and meeting all other requirements   Future Appointments No visits were found meeting these conditions.  Showing future appointments within next 90 days and meeting all other requirements   I discussed the assessment and treatment plan with the patient. The patient was provided an opportunity to ask questions and all were answered. The patient agreed with the plan and demonstrated an understanding of the instructions.  Patient advised to call back or seek an in-person evaluation if the symptoms or condition worsens.  Total duration of non-face-to-face encounter: 13 minutes.  Note by: Gaspar Cola, MD Date: 01/25/2019; Time: 6:59 AM  Note: This dictation was prepared with Dragon dictation. Any transcriptional errors that may result from this process are unintentional.  Disclaimer:  * Given the special circumstances of the COVID-19 pandemic, the federal government has announced that the Office for Civil Rights (OCR) will exercise its enforcement discretion and will not impose penalties on physicians using telehealth in the event of noncompliance with regulatory requirements under the [place] (HIPAA) in connection with the good faith provision of telehealth during the AFBXU-38 national public health emergency. ([place])

## 2019-03-31 ENCOUNTER — Other Ambulatory Visit: Payer: Self-pay | Admitting: Pain Medicine

## 2019-03-31 DIAGNOSIS — E0842 Diabetes mellitus due to underlying condition with diabetic polyneuropathy: Secondary | ICD-10-CM

## 2019-03-31 DIAGNOSIS — M792 Neuralgia and neuritis, unspecified: Secondary | ICD-10-CM

## 2019-03-31 DIAGNOSIS — G629 Polyneuropathy, unspecified: Secondary | ICD-10-CM

## 2019-05-03 ENCOUNTER — Ambulatory Visit: Payer: Medicare Other | Attending: Pain Medicine | Admitting: Pain Medicine

## 2019-05-04 ENCOUNTER — Telehealth: Payer: Self-pay

## 2019-05-04 ENCOUNTER — Encounter: Payer: Self-pay | Admitting: Pain Medicine

## 2019-05-04 ENCOUNTER — Telehealth: Payer: Self-pay | Admitting: *Deleted

## 2019-05-04 NOTE — Progress Notes (Signed)
Patient: Kenneth Osborn  Service Category: E/M  Provider: Gaspar Cola, MD  DOB: 15-Apr-1955  DOS: 05/05/2019  Location: Office  MRN: [id number]  Setting: Ambulatory outpatient  Referring Provider: [name], MD  Type: Established Patient  Specialty: Interventional Pain Management  PCP: [name], MD  Location: Remote location  Delivery: TeleHealth     Virtual Encounter - Pain Management PROVIDER NOTE: Information contained herein reflects review and annotations entered in association with encounter. Interpretation of such information and data should be left to medically-trained personnel. Information provided to patient can be located elsewhere in the medical record under "Patient Instructions". Document created using STT-dictation technology, any transcriptional errors that may result from process are unintentional.    Contact & Pharmacy Preferred: [place]: [phone] (home) Mobile: [phone] (mobile) E-mail: [email or web address]  [place], [place] - [address] [place] [address] Phone: [phone] Fax: [phone]  [place], [place] [facility] [address] [place] [address] [place] [address] Phone: [phone] Fax: [phone]   Pre-screening  [name] offered "in-person" vs "virtual" encounter. He indicated preferring virtual for this encounter.   Reason COVID-19*  Social distancing based on CDC and AMA recommendations.   I contacted Kenneth Osborn on 05/05/2019 via telephone.      I clearly identified myself as 05/18/2019, MD. I verified that I was speaking with the correct person using two identifiers (Name: Kenneth Osborn, and date of birth: 12-22-1955).  Consent I sought verbal advanced consent from 10/24/1955 for virtual visit interactions. I informed Kenneth Osborn of possible security and privacy concerns, risks, and limitations associated  with providing "not-in-person" medical evaluation and management services. I also informed Kenneth Osborn of the availability of "in-person" appointments. Finally, I informed him that there would be a charge for the virtual visit and that he could be  personally, fully or partially, financially responsible for it. Kenneth Osborn expressed understanding and agreed to proceed.   Historic Elements   Kenneth Osborn is a 64 y.o. year old, male patient evaluated today after his last contact with our practice on 05/04/2019. Kenneth Osborn  has a past medical history of Acute postoperative pain (12/03/2018), Allergic rhinitis (12/30/2012), Bronchitis, Can't get food down (08/12/2014), Chronic back pain, Concussion (09/2015), COPD (chronic obstructive pulmonary disease) ([place]), Coronary artery disease, DDD (degenerative disc disease), cervical, Dehydration symptoms, Diabetes mellitus without complication ([place]), Dysphagia, GERD (gastroesophageal reflux disease), History of Meniere's disease (12/21/2014), History of thoracic spine surgery (S/P T9-10 IVD spacer) (12/21/2014), Hypercholesteremia, Hyperlipidemia, Hypertension, Meniere's disease, Myocardial infarction ([place]), Neuromuscular disorder ([place]), Psychosis ([place]), Short-segment Barrett's esophagus, and Sleep apnea. He also  has a past surgical history that includes Cardiovascular stress test; Cardiac catheterization; Coronary angioplasty; Labrinthectomy; mastoid shunt; Appendectomy; Back surgery; Shoulder arthroscopy with subacromial decompression (Left, 04/06/2012); ARTHRODESIS ANTERIOR ANTERIOR CERVICLE SPINE (01/04/2013); Colonoscopy with propofol (N/A, 09/19/2014); Esophagogastroduodenoscopy (N/A, 09/19/2014); Savory dilation (N/A, 09/19/2014); Spinal cord stimulator implant (Right); Cardiac catheterization (N/A, 06/15/2015); Cardiac catheterization (N/A, 06/15/2015); and Pulse generator implant (N/A, 01/18/2019). Kenneth Osborn has a current medication list which includes the following  prescription(s): alprazolam, cetirizine, cyclobenzaprine, [START ON 06/24/2019] cyclobenzaprine, diazepam, diltiazem, docusate sodium, enalapril, esomeprazole, ezetimibe, gabapentin, [START ON 06/24/2019] gabapentin, glucagon, humulin r u-500 kwikpen, isosorbide mononitrate, magnesium oxide, [START ON 06/24/2019] magnesium oxide, meclizine, metformin, metoprolol succinate, montelukast, naloxone, niacin, nitroglycerin, fish oil, [START ON 05/08/2019] oxycodone hcl, [START ON 06/07/2019] oxycodone hcl, [START  ON 07/07/2019] oxycodone hcl, prasugrel, and rosuvastatin. He  reports that he has never smoked. He has never used smokeless tobacco. He reports that he does not drink alcohol or use drugs. Kenneth Osborn has No Known Allergies.   HPI  Today, he is being contacted for medication management. The patient indicates doing well with the current medication regimen. No adverse reactions or side effects reported to the medications.  The patient also refers doing extremely well after the radiofrequency ablation and he refers that the pain is significantly lower than before.  The patient was reminded that the RFA should last anywhere from 3 months to 18 months after waiting will begin to wear off and he simply needs to let us know when it does so that we can have it repeated.  He understood and accepted.  Pharmacotherapy Assessment  Analgesic: Oxycodone IR 10 mg, 1 tab PO q 6 hrs (40 mg/day of oxycodone) MME/day:60 mg/day.   Monitoring: [place] PMP: PDMP reviewed during this encounter.       Pharmacotherapy: No side-effects or adverse reactions reported. Compliance: No problems identified. Effectiveness: Clinically acceptable. Plan: Refer to "POC".  UDS:  Summary  Date Value Ref Range Status  03/18/2018 FINAL  Final    Comment:    ==================================================================== TOXASSURE SELECT 13 (MW) ==================================================================== Test                              Result       Flag       Units Drug Present and Declared for Prescription Verification   Desmethyldiazepam              123          EXPECTED   ng/mg creat   Oxazepam                       >897         EXPECTED   ng/mg creat   Temazepam                      291          EXPECTED   ng/mg creat    Desmethyldiazepam, oxazepam, and temazepam are expected    metabolites of diazepam. Desmethyldiazepam and oxazepam are also    expected metabolites of other drugs, including chlordiazepoxide,    prazepam, clorazepate, and halazepam. Oxazepam is an expected    metabolite of temazepam. Oxazepam and temazepam are also    available as scheduled prescription medications.   Oxycodone                      1956         EXPECTED   ng/mg creat   Oxymorphone                    4052         EXPECTED   ng/mg creat   Noroxycodone                   957          EXPECTED   ng/mg creat   Noroxymorphone                 485          EXPECTED   ng/mg creat    Sources of oxycodone are scheduled prescription medications.  Oxymorphone, noroxycodone, and noroxymorphone are expected    metabolites of oxycodone. Oxymorphone is also available as a    scheduled prescription medication. Drug Absent but Declared for Prescription Verification   Alprazolam                     Not Detected UNEXPECTED ng/mg creat ==================================================================== Test                      Result    Flag   Units      Ref Range   Creatinine              223              mg/dL      >=20 ==================================================================== Declared Medications:  The flagging and interpretation on this report are based on the  following declared medications.  Unexpected results may arise from  inaccuracies in the declared medications.  **Note: The testing scope of this panel includes these medications:  Alprazolam (Xanax)  Diazepam (Valium)  Oxycodone  **Note: The testing scope of this  panel does not include following  reported medications:  Aspirin  Cetirizine (Zyrtec)  Cyclobenzaprine (Flexeril)  Diltiazem (Cardizem)  Ezetimibe (Zetia)  Gabapentin (Neurontin)  Insulin (Humulin)  Isosorbide (Imdur)  Magnesium  Meclizine (Antivert)  Metformin (Glucophage)  Metoprolol (Toprol)  Montelukast (Singulair)  Naloxone (Narcan)  Niacin (Niaspan)  Nitroglycerin (Nitrostat)  Omega-3 Fatty Acids (Fish Oil)  Omeprazole (Nexium)  Prasugrel (Effient)  Rosuvastatin (Crestor) ==================================================================== For clinical consultation, please call [phone]. ====================================================================    Laboratory Chemistry Profile   Renal Lab Results  Component Value Date   BUN 20 01/08/2019   CREATININE 1.46 (H) 01/08/2019   GFRAA 58 (L) 01/08/2019   GFRNONAA 50 (L) 01/08/2019    Hepatic Lab Results  Component Value Date   AST 31 01/18/2018   ALT 33 01/18/2018   ALBUMIN 4.1 01/18/2018   ALKPHOS 68 01/18/2018    Electrolytes Lab Results  Component Value Date   NA 139 01/08/2019   K 4.4 01/08/2019   CL 101 01/08/2019   CALCIUM 9.7 01/08/2019   MG 1.7 03/22/2015    Bone No results found for: VD25OH, VD125OH2TOT, FA2130QM5, HQ4696EX5, 25OHVITD1, 25OHVITD2, 25OHVITD3, TESTOFREE, TESTOSTERONE  Inflammation (CRP: Acute Phase) (ESR: Chronic Phase) Lab Results  Component Value Date   CRP 1.4 (H) 03/22/2015   ESRSEDRATE 9 03/22/2015      Note: Above Lab results reviewed.  Imaging  DG PAIN CLINIC C-ARM 1-60 MIN NO REPORT Fluoro was used, but no Radiologist interpretation will be provided.  Please refer to "NOTES" tab for provider progress note.  Assessment  The primary encounter diagnosis was Chronic pain syndrome. 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), and Polyneuropathy were also pertinent to this visit.  Plan of Care  Problem-specific:  No problem-specific Assessment & Plan notes found for this encounter.  Kenneth Osborn has a current medication list which includes the following long-term medication(s): cetirizine, cyclobenzaprine, [START ON 06/24/2019] cyclobenzaprine, esomeprazole, ezetimibe, gabapentin, [START ON 06/24/2019] gabapentin, humulin r u-500 kwikpen, magnesium oxide, [START ON 06/24/2019] magnesium oxide, metoprolol succinate, montelukast, niacin, [START ON 05/08/2019] oxycodone hcl, [START ON 06/07/2019] oxycodone  hcl, and [START ON 07/07/2019] oxycodone hcl.  Pharmacotherapy (Medications Ordered): Meds ordered this encounter  Medications  . Oxycodone HCl 10 MG TABS    Sig: Take 1 tablet (10 mg total) by mouth every 6 (six) hours as needed. Must last 30 days    Dispense:  120 tablet    Refill:  0    Chronic Pain: STOP Act (Not applicable) Fill 1 day early if closed on refill date. Do not fill until: 05/08/2019. To last until: 06/07/2019. Avoid benzodiazepines within 8 hours of opioids  . Oxycodone HCl 10 MG TABS    Sig: Take 1 tablet (10 mg total) by mouth every 6 (six) hours as needed. Must last 30 days    Dispense:  120 tablet    Refill:  0    Chronic Pain: STOP Act (Not applicable) Fill 1 day early if closed on refill date. Do not fill until: 06/07/2019. To last until: 07/07/2019. Avoid benzodiazepines within 8 hours of opioids  . Oxycodone HCl 10 MG TABS    Sig: Take 1 tablet (10 mg total) by mouth every 6 (six) hours as needed. Must last 30 days    Dispense:  120 tablet    Refill:  0    Chronic Pain: STOP Act (Not applicable) Fill 1 day early if closed on refill  date. Do not fill until: 07/07/2019. To last until: 08/06/2019. Avoid benzodiazepines within 8 hours of opioids  . Magnesium Oxide 500 MG TABS    Sig: Take 1 tablet (500 mg total) by mouth daily.    Dispense:  90 tablet    Refill:  1    Fill one day early if pharmacy is closed on scheduled refill date. May substitute for generic if available.  . cyclobenzaprine (FLEXERIL) 10 MG tablet    Sig: Take 1 tablet (10 mg total) by mouth 3 (three) times daily as needed for muscle spasms.    Dispense:  270 tablet    Refill:  1    Fill one day early if pharmacy is closed on scheduled refill date. May substitute for generic if available.  . gabapentin (NEURONTIN) 800 MG tablet    Sig: Take 2 tablets (1,600 mg total) by mouth 2 (two) times daily.    Dispense:  360 tablet    Refill:  1    Fill one day early if pharmacy is closed on scheduled refill date. May substitute for generic if available.   Orders:  No orders of the defined types were placed in this encounter.  Follow-up plan:   Return in about 13 weeks (around 08/04/2019) for (VV), (MM).      Considering: NOTE: EFFIENT Anticoagulation (Stop: 7-10 days  Re-start: 6 hrs)    Palliative PRN treatment(s): Palliative right PSIS MNB/TPI  Diagnostic/palliative right lumbar facet block #3  Diagnostic/palliative left lumbar facet block #2  Palliative left-sided lumbar facet RFA #2 (last done 12/03/2018)  Palliative right sided lumbar facet RFA #2 (last done 12/17/2018)     Recent Visits No visits were found meeting these conditions.  Showing recent visits within past 90 days and meeting all other requirements   Today's Visits Date Type Provider Dept  05/05/19 Telemedicine Milinda Pointer, MD [facility]  Showing today's visits and meeting all other requirements   Future Appointments No visits were found meeting these conditions.  Showing future appointments within next 90 days and meeting all other requirements   I  discussed the assessment and treatment plan with the patient. The  patient was provided an opportunity to ask questions and all were answered. The patient agreed with the plan and demonstrated an understanding of the instructions.  Patient advised to call back or seek an in-person evaluation if the symptoms or condition worsens.  Duration of encounter: 12 minutes.  Note by: Gaspar Cola, MD Date: 05/05/2019; Time: 9:23 AM

## 2019-05-04 NOTE — Telephone Encounter (Signed)
Left voicemail for patient to call prior to appt on 05/05/19 to go over medications.

## 2019-05-04 NOTE — Telephone Encounter (Signed)
No answer. Left message to call us back to review medications for 05/05/19 VV

## 2019-05-05 ENCOUNTER — Ambulatory Visit: Payer: Medicare Other | Attending: Pain Medicine | Admitting: Pain Medicine

## 2019-05-05 ENCOUNTER — Other Ambulatory Visit: Payer: Self-pay

## 2019-05-05 DIAGNOSIS — G629 Polyneuropathy, unspecified: Secondary | ICD-10-CM

## 2019-05-05 DIAGNOSIS — G894 Chronic pain syndrome: Secondary | ICD-10-CM | POA: Diagnosis not present

## 2019-05-05 DIAGNOSIS — Z9889 Other specified postprocedural states: Secondary | ICD-10-CM

## 2019-05-05 DIAGNOSIS — M792 Neuralgia and neuritis, unspecified: Secondary | ICD-10-CM

## 2019-05-05 DIAGNOSIS — M5442 Lumbago with sciatica, left side: Secondary | ICD-10-CM

## 2019-05-05 DIAGNOSIS — K219 Gastro-esophageal reflux disease without esophagitis: Secondary | ICD-10-CM

## 2019-05-05 DIAGNOSIS — M960 Pseudarthrosis after fusion or arthrodesis: Secondary | ICD-10-CM | POA: Diagnosis not present

## 2019-05-05 DIAGNOSIS — G8929 Other chronic pain: Secondary | ICD-10-CM

## 2019-05-05 DIAGNOSIS — K2270 Barrett's esophagus without dysplasia: Secondary | ICD-10-CM

## 2019-05-05 DIAGNOSIS — E0842 Diabetes mellitus due to underlying condition with diabetic polyneuropathy: Secondary | ICD-10-CM

## 2019-05-05 DIAGNOSIS — M7918 Myalgia, other site: Secondary | ICD-10-CM

## 2019-05-05 MED ORDER — GABAPENTIN 800 MG PO TABS: 1600.0000 mg | ORAL_TABLET | Freq: Two times a day (BID) | ORAL | 1 refills | Status: DC

## 2019-05-05 MED ORDER — OXYCODONE HCL 10 MG PO TABS: 10.0000 mg | ORAL_TABLET | Freq: Four times a day (QID) | ORAL | 0 refills | Status: DC | PRN

## 2019-05-05 MED ORDER — CYCLOBENZAPRINE HCL 10 MG PO TABS: 10.0000 mg | ORAL_TABLET | Freq: Three times a day (TID) | ORAL | 1 refills | Status: DC | PRN

## 2019-05-05 MED ORDER — MAGNESIUM OXIDE -MG SUPPLEMENT 500 MG PO TABS: 500.0000 mg | ORAL_TABLET | Freq: Every day | ORAL | 1 refills | Status: DC

## 2019-07-14 ENCOUNTER — Other Ambulatory Visit: Payer: Self-pay | Admitting: Neurosurgery

## 2019-07-29 ENCOUNTER — Other Ambulatory Visit: Payer: Self-pay

## 2019-07-29 ENCOUNTER — Encounter
Admission: RE | Admit: 2019-07-29 | Discharge: 2019-07-29 | Disposition: A | Discharge status: Home / Self Care | Payer: Medicare Other | Source: Ambulatory Visit | Attending: Neurosurgery | Admitting: Neurosurgery

## 2019-07-29 HISTORY — DX: Angina pectoris, unspecified: I20.9

## 2019-07-29 NOTE — Patient Instructions (Signed)
Your procedure is scheduled on: 08/09/19 Report to [facility]. To find out your arrival time please call [phone] between 1PM - 3PM on 08/06/19.  Remember: Instructions that are not followed completely may result in serious medical risk, up to and including death, or upon the discretion of your surgeon and anesthesiologist your surgery may need to be rescheduled.     _X__ 1. Do not eat food after midnight the night before your procedure.                 No gum chewing or hard candies. You may drink clear liquids up to 2 hours                 before you are scheduled to arrive for your surgery- DO not drink clear                 liquids within 2 hours of the start of your surgery.                 Clear Liquids include:  water, apple juice without pulp, clear carbohydrate                 drink such as Clearfast or Gatorade, Black Coffee or Tea (Do not add                 anything to coffee or tea). Diabetics water only  __X__2.  On the morning of surgery brush your teeth with toothpaste and water, you                 may rinse your mouth with mouthwash if you wish.  Do not swallow any              toothpaste of mouthwash.     _X__ 3.  No Alcohol for 24 hours before or after surgery.   _X__ 4.  Do Not Smoke or use e-cigarettes For 24 Hours Prior to Your Surgery.                 Do not use any chewable tobacco products for at least 6 hours prior to                 surgery.  ____  5.  Bring all medications with you on the day of surgery if instructed.   __X__  6.  Notify your doctor if there is any change in your medical condition      (cold, fever, infections).     Do not wear jewelry, make-up, hairpins, clips or nail polish. Do not wear lotions, powders, or perfumes.  Do not shave 48 hours prior to surgery. Men may shave face and neck. Do not bring valuables to the hospital.    [facility] is not responsible for any belongings or  valuables.  Contacts, dentures/partials or body piercings may not be worn into surgery. Bring a case for your contacts, glasses or hearing aids, a denture cup will be supplied. Leave your suitcase in the car. After surgery it may be brought to your room. For patients admitted to the hospital, discharge time is determined by your treatment team.   Patients discharged the day of surgery will not be allowed to drive home.   Please read over the following fact sheets that you were given:   MRSA Information  __X__ Take these medicines the morning of surgery with A SIP OF WATER:  1. cetirizine (ZYRTEC) 10 MG tablet  2. diazepam (VALIUM) 5 MG tablet  3. esomeprazole (NEXIUM) 40 MG capsule  4. gabapentin (NEURONTIN) 1600 MG tablet  5. metoprolol succinate (TOPROL-XL) 100 MG 24 hr tablet  6. Oxycodone HCl 10 MG TABS IF NEEDED  ____ Fleet Enema (as directed)   __X__ Use CHG Soap/SAGE wipes as directed  ____ Use inhalers on the day of surgery  __X__ Stop metformin/Janumet/Farxiga 2 days prior to surgery    __X__ Take 1/2 of usual insulin dose the night before surgery. No insulin the morning          of surgery. YOU CAN TAKE THE FULL DINNER/SUPPER DOSE  __X__ Stop Blood Thinners Coumadin/Plavix/Xarelto/Pleta/Pradaxa/Eliquis/Effient/Aspirin AS INSTRUCTED    Or contact your Surgeon, Cardiologist or Medical Doctor regarding  ability to stop your blood thinners  __X__ Stop Anti-inflammatories 7 days before surgery such as Advil, Ibuprofen, Motrin,  BC or Goodies Powder, Naprosyn, Naproxen, Aleve, Aspirin    __X__ Stop all herbal supplements, fish oil or vitamin E until after surgery.  Stop 7 days before  ____ Bring C-Pap to the hospital.

## 2019-07-30 ENCOUNTER — Encounter: Payer: Self-pay | Admitting: Pain Medicine

## 2019-08-03 DIAGNOSIS — M899 Disorder of bone, unspecified: Secondary | ICD-10-CM | POA: Insufficient documentation

## 2019-08-03 DIAGNOSIS — Z79899 Other long term (current) drug therapy: Secondary | ICD-10-CM | POA: Insufficient documentation

## 2019-08-03 DIAGNOSIS — Z789 Other specified health status: Secondary | ICD-10-CM | POA: Insufficient documentation

## 2019-08-03 NOTE — Progress Notes (Signed)
Patient: TREMAR WICKENS  Service Category: E/M  Provider: Gaspar Cola, MD  DOB: 04/16/1955  DOS: 08/04/2019  Location: Office  MRN: [id number]  Setting: Ambulatory outpatient  Referring Provider: [name], MD  Type: Established Patient  Specialty: Interventional Pain Management  PCP: [name], MD  Location: Remote location  Delivery: TeleHealth     Virtual Encounter - Pain Management PROVIDER NOTE: Information contained herein reflects review and annotations entered in association with encounter. Interpretation of such information and data should be left to medically-trained personnel. Information provided to patient can be located elsewhere in the medical record under "Patient Instructions". Document created using STT-dictation technology, any transcriptional errors that may result from process are unintentional.    Contact & Pharmacy Preferred: [place]: [phone] (home) Mobile: [phone] (mobile) E-mail: [email or web address]  [facility] [place], [place] [address] [place] [address] Phone: [phone] Fax: [phone]  [place], [place], [address] [place], [place] [address] [place] [address] Phone: [phone] Fax: [phone]   Pre-screening  Mr. Debold offered "in-person" vs "virtual" encounter. He indicated preferring virtual for this encounter.   Reason COVID-19*  Social distancing based on CDC and AMA recommendations.   I contacted MIKIAS LANZ on 08/04/2019 via telephone.      I clearly identified myself as 08/17/2019, MD. I verified that I was speaking with the correct person using two identifiers (Name: REISS MOWREY, and date of birth: May 15, 1955).  Consent I sought verbal advanced consent from 10/24/1955 for virtual visit interactions. I informed Mr. Dahan of possible security and privacy concerns, risks, and  limitations associated with providing "not-in-person" medical evaluation and management services. I also informed Mr. Mezera of the availability of "in-person" appointments. Finally, I informed him that there would be a charge for the virtual visit and that he could be  personally, fully or partially, financially responsible for it. Mr. Shell expressed understanding and agreed to proceed.   Historic Elements   Mr. RASOOL ROMMEL is a 64 y.o. year old, male patient evaluated today after his last contact with our practice on 05/04/2019. Mr. Gaddie  has a past medical history of Acute postoperative pain (12/03/2018), Allergic rhinitis (12/30/2012), Anginal pain ([place]), Bronchitis, Can't get food down (08/12/2014), Chronic back pain, Concussion (09/2015), COPD (chronic obstructive pulmonary disease) ([place]), Coronary artery disease, DDD (degenerative disc disease), cervical, Dehydration symptoms, Diabetes mellitus without complication ([place]), Dysphagia, GERD (gastroesophageal reflux disease), History of Meniere's disease (12/21/2014), History of thoracic spine surgery (S/P T9-10 IVD spacer) (12/21/2014), Hypercholesteremia, Hyperlipidemia, Hypertension, Meniere's disease, Myocardial infarction ([place]), Neuromuscular disorder ([place]), Psychosis ([place]), Short-segment Barrett's esophagus, and Sleep apnea. He also  has a past surgical history that includes Cardiovascular stress test; Cardiac catheterization; Coronary angioplasty; Labrinthectomy; mastoid shunt; Appendectomy; Back surgery; Shoulder arthroscopy with subacromial decompression (Left, 04/06/2012); ARTHRODESIS ANTERIOR ANTERIOR CERVICLE SPINE (01/04/2013); Colonoscopy with propofol (N/A, 09/19/2014); Esophagogastroduodenoscopy (N/A, 09/19/2014); Savory dilation (N/A, 09/19/2014); Spinal cord stimulator implant (Right); Cardiac catheterization (N/A, 06/15/2015); Cardiac catheterization (N/A, 06/15/2015); and Pulse generator implant (N/A, 01/18/2019). Mr. Barkan has a current medication  list which includes the following prescription(s): alprazolam, cetirizine, cyclobenzaprine, diazepam, diltiazem, enalapril, esomeprazole, ezetimibe, gabapentin, glucagon, humulin r u-500 kwikpen, isosorbide mononitrate, magnesium, magnesium oxide, meclizine, metformin, metoprolol succinate, montelukast, naloxone, niacin, nitroglycerin, fish oil, [START ON 08/06/2019] oxycodone hcl, [START ON 09/05/2019] oxycodone hcl, [START ON 10/05/2019] oxycodone hcl, prasugrel, and rosuvastatin. He  reports that he has never smoked. He has never used smokeless tobacco. He reports that he does not drink alcohol and does not use drugs. Mr. Rafanan has No Known Allergies.   HPI  Today, he is being contacted for medication management. The patient indicates doing well with the current medication regimen. No adverse reactions or side effects reported to the medications.  The patient indicates that he is on the schedule to have the spinal cord stimulator removed this next Monday by Dr. Lacinda Axon.  He indicated that although the stimulator seems to be providing him with some relief of the lower extremity pain, the side effects that he is getting from it outweigh its benefits.  He indicates that since his surgery he has been experiencing more pain and spasms in the area of the lower back.  He attributes these to the implant.  Today we talked about a couple things that could be responsible for something like that including the facet joints.  I told the patient that since he has already made his mind regarding the next step, to just give me a call after he has had the device removed, especially if he still having problems with spasms.  When he comes then, we will review the findings on his back can see if there is any evidence of persistent problems with the facet joints that could account for the paravertebral muscles as a consequence of irritation of the medial nerve.  Pharmacotherapy Assessment  Analgesic: Oxycodone IR 10 mg, 1 tab PO q 6  hrs (40 mg/day of oxycodone) MME/day:60 mg/day.   Monitoring: [place] PMP: PDMP reviewed during this encounter.       Pharmacotherapy: No side-effects or adverse reactions reported. Compliance: No problems identified. Effectiveness: Clinically acceptable. Plan: Refer to "POC".  UDS:  Summary  Date Value Ref Range Status  03/18/2018 FINAL  Final    Comment:    ==================================================================== TOXASSURE SELECT 13 (MW) ==================================================================== Test                             Result       Flag       Units Drug Present and Declared for Prescription Verification   Desmethyldiazepam              123          EXPECTED   ng/mg creat   Oxazepam                       >897         EXPECTED   ng/mg creat   Temazepam                      291          EXPECTED   ng/mg creat    Desmethyldiazepam, oxazepam, and temazepam are expected    metabolites of diazepam. Desmethyldiazepam and oxazepam are also    expected metabolites of other drugs, including chlordiazepoxide,    prazepam, clorazepate, and halazepam. Oxazepam is an expected    metabolite of temazepam. Oxazepam and temazepam are also    available as scheduled prescription medications.   Oxycodone                      1956         EXPECTED   ng/mg creat   Oxymorphone  4052         EXPECTED   ng/mg creat   Noroxycodone                   957          EXPECTED   ng/mg creat   Noroxymorphone                 485          EXPECTED   ng/mg creat    Sources of oxycodone are scheduled prescription medications.    Oxymorphone, noroxycodone, and noroxymorphone are expected    metabolites of oxycodone. Oxymorphone is also available as a    scheduled prescription medication. Drug Absent but Declared for Prescription Verification   Alprazolam                     Not Detected UNEXPECTED ng/mg  creat ==================================================================== Test                      Result    Flag   Units      Ref Range   Creatinine              223              mg/dL      >=20 ==================================================================== Declared Medications:  The flagging and interpretation on this report are based on the  following declared medications.  Unexpected results may arise from  inaccuracies in the declared medications.  **Note: The testing scope of this panel includes these medications:  Alprazolam (Xanax)  Diazepam (Valium)  Oxycodone  **Note: The testing scope of this panel does not include following  reported medications:  Aspirin  Cetirizine (Zyrtec)  Cyclobenzaprine (Flexeril)  Diltiazem (Cardizem)  Ezetimibe (Zetia)  Gabapentin (Neurontin)  Insulin (Humulin)  Isosorbide (Imdur)  Magnesium  Meclizine (Antivert)  Metformin (Glucophage)  Metoprolol (Toprol)  Montelukast (Singulair)  Naloxone (Narcan)  Niacin (Niaspan)  Nitroglycerin (Nitrostat)  Omega-3 Fatty Acids (Fish Oil)  Omeprazole (Nexium)  Prasugrel (Effient)  Rosuvastatin (Crestor) ==================================================================== For clinical consultation, please call [phone]. ====================================================================     Laboratory Chemistry Profile   Renal Lab Results  Component Value Date   BUN 20 01/08/2019   CREATININE 1.46 (H) 01/08/2019   GFRAA 58 (L) 01/08/2019   GFRNONAA 50 (L) 01/08/2019     Hepatic Lab Results  Component Value Date   AST 31 01/18/2018   ALT 33 01/18/2018   ALBUMIN 4.1 01/18/2018   ALKPHOS 68 01/18/2018     Electrolytes Lab Results  Component Value Date   NA 139 01/08/2019   K 4.4 01/08/2019   CL 101 01/08/2019   CALCIUM 9.7 01/08/2019   MG 1.7 03/22/2015     Bone No results found for: VD25OH, VD125OH2TOT, UQ3335KT6, YB6389HT3, 25OHVITD1, 25OHVITD2, 25OHVITD3,  TESTOFREE, TESTOSTERONE   Inflammation (CRP: Acute Phase) (ESR: Chronic Phase) Lab Results  Component Value Date   CRP 1.4 (H) 03/22/2015   ESRSEDRATE 9 03/22/2015       Note: Above Lab results reviewed.  Imaging  DG PAIN CLINIC C-ARM 1-60 MIN NO REPORT Fluoro was used, but no Radiologist interpretation will be provided.  Please refer to "NOTES" tab for provider progress note.  Assessment  The primary encounter diagnosis was Chronic pain syndrome. Diagnoses of Chronic low back pain (Primary Area of Pain) (Bilateral) (L>R), [employer] spinal cord stimulator (implant date: 11/01/2014), Pharmacologic therapy, Disorder of skeletal system, and Problems influencing health  status were also pertinent to this visit.  Plan of Care  Problem-specific:  No problem-specific Assessment & Plan notes found for this encounter.  Mr. RUDY LUHMANN has a current medication list which includes the following long-term medication(s): cetirizine, cyclobenzaprine, esomeprazole, ezetimibe, gabapentin, humulin r u-500 kwikpen, magnesium oxide, metoprolol succinate, montelukast, niacin, [START ON 08/06/2019] oxycodone hcl, [START ON 09/05/2019] oxycodone hcl, and [START ON 10/05/2019] oxycodone hcl.  Pharmacotherapy (Medications Ordered): Meds ordered this encounter  Medications  . Oxycodone HCl 10 MG TABS    Sig: Take 1 tablet (10 mg total) by mouth every 6 (six) hours as needed. Must last 30 days    Dispense:  120 tablet    Refill:  0    Chronic Pain: STOP Act (Not applicable) Fill 1 day early if closed on refill date. Do not fill until: 08/06/2019. To last until: 09/05/2019. Avoid benzodiazepines within 8 hours of opioids  . Oxycodone HCl 10 MG TABS    Sig: Take 1 tablet (10 mg total) by mouth every 6 (six) hours as needed. Must last 30 days    Dispense:  120 tablet    Refill:  0    Chronic Pain: STOP Act (Not applicable) Fill 1 day early if closed on refill date. Do not fill until: 09/05/2019. To last until:  10/05/2019. Avoid benzodiazepines within 8 hours of opioids  . Oxycodone HCl 10 MG TABS    Sig: Take 1 tablet (10 mg total) by mouth every 6 (six) hours as needed. Must last 30 days    Dispense:  120 tablet    Refill:  0    Chronic Pain: STOP Act (Not applicable) Fill 1 day early if closed on refill date. Do not fill until: 10/05/2019. To last until: 11/04/2019. Avoid benzodiazepines within 8 hours of opioids   Orders:  Orders Placed This Encounter  Procedures  . ToxASSURE Select 13 (MW), Urine    Volume: 30 ml(s). Minimum 3 ml of urine is needed. Document temperature of fresh sample. Indications: Long term (current) use of opiate analgesic (B16.945)    Order Specific Question:   Release to patient    Answer:   Immediate   Follow-up plan:   Return in about 13 weeks (around 11/03/2019) for (F2F), (MM).      Considering: NOTE: EFFIENT Anticoagulation (Stop: 7-10 days  Re-start: 6 hrs)    Palliative PRN treatment(s): Palliative right PSIS MNB/TPI  Diagnostic/palliative right lumbar facet block #3  Diagnostic/palliative left lumbar facet block #2  Palliative left-sided lumbar facet RFA #2 (last done 12/03/2018)  Palliative right sided lumbar facet RFA #2 (last done 12/17/2018)     Recent Visits No visits were found meeting these conditions. Showing recent visits within past 90 days and meeting all other requirements Today's Visits Date Type Provider Dept  08/04/19 Telemedicine Milinda Pointer, MD [facility]  Showing today's visits and meeting all other requirements Future Appointments No visits were found meeting these conditions. Showing future appointments within next 90 days and meeting all other requirements  I discussed the assessment and treatment plan with the patient. The patient was provided an opportunity to ask questions and all were answered. The patient agreed with the plan and demonstrated an understanding of the instructions.  Patient advised to  call back or seek an in-person evaluation if the symptoms or condition worsens.  Duration of encounter: 25 minutes.  Note by: Gaspar Cola, MD Date: 08/04/2019; Time: 12:35 PM

## 2019-08-04 ENCOUNTER — Other Ambulatory Visit: Payer: Self-pay

## 2019-08-04 ENCOUNTER — Ambulatory Visit: Payer: Medicare Other | Attending: Pain Medicine | Admitting: Pain Medicine

## 2019-08-04 DIAGNOSIS — M899 Disorder of bone, unspecified: Secondary | ICD-10-CM

## 2019-08-04 DIAGNOSIS — Z789 Other specified health status: Secondary | ICD-10-CM

## 2019-08-04 DIAGNOSIS — Z79899 Other long term (current) drug therapy: Secondary | ICD-10-CM

## 2019-08-04 DIAGNOSIS — G8929 Other chronic pain: Secondary | ICD-10-CM

## 2019-08-04 DIAGNOSIS — M5442 Lumbago with sciatica, left side: Secondary | ICD-10-CM | POA: Diagnosis not present

## 2019-08-04 DIAGNOSIS — Z9689 Presence of other specified functional implants: Secondary | ICD-10-CM

## 2019-08-04 DIAGNOSIS — G894 Chronic pain syndrome: Secondary | ICD-10-CM | POA: Diagnosis not present

## 2019-08-04 MED ORDER — OXYCODONE HCL 10 MG PO TABS: 10.0000 mg | ORAL_TABLET | Freq: Four times a day (QID) | ORAL | 0 refills | Status: DC | PRN

## 2019-08-05 ENCOUNTER — Other Ambulatory Visit: Admission: RE | Admit: 2019-08-05 | Payer: Medicare Other | Source: Ambulatory Visit

## 2019-08-05 ENCOUNTER — Other Ambulatory Visit
Admission: RE | Admit: 2019-08-05 | Discharge: 2019-08-05 | Disposition: A | Discharge status: Home / Self Care | Payer: Medicare Other | Source: Ambulatory Visit | Attending: Neurosurgery | Admitting: Neurosurgery

## 2019-08-05 ENCOUNTER — Other Ambulatory Visit: Payer: Self-pay

## 2019-08-05 DIAGNOSIS — Z20822 Contact with and (suspected) exposure to covid-19: Secondary | ICD-10-CM | POA: Insufficient documentation

## 2019-08-05 DIAGNOSIS — Z01812 Encounter for preprocedural laboratory examination: Secondary | ICD-10-CM | POA: Insufficient documentation

## 2019-08-05 LAB — URINALYSIS, ROUTINE W REFLEX MICROSCOPIC
Bilirubin Urine: NEGATIVE
Glucose, UA: NEGATIVE mg/dL
Hgb urine dipstick: NEGATIVE
Ketones, ur: NEGATIVE mg/dL
Leukocytes,Ua: NEGATIVE
Nitrite: NEGATIVE
Protein, ur: NEGATIVE mg/dL
Specific Gravity, Urine: 1.009 (ref 1.005–1.030)
pH: 7 (ref 5.0–8.0)

## 2019-08-05 LAB — TYPE AND SCREEN
ABO/RH(D): O POS
Antibody Screen: NEGATIVE

## 2019-08-05 LAB — APTT: aPTT: 27 seconds (ref 24–36)

## 2019-08-05 LAB — CBC
HCT: 41.4 % (ref 39.0–52.0)
Hemoglobin: 14.4 g/dL (ref 13.0–17.0)
MCH: 27.7 pg (ref 26.0–34.0)
MCHC: 34.8 g/dL (ref 30.0–36.0)
MCV: 79.8 fL — ABNORMAL LOW (ref 80.0–100.0)
Platelets: 214 10*3/uL (ref 150–400)
RBC: 5.19 MIL/uL (ref 4.22–5.81)
RDW: 14.9 % (ref 11.5–15.5)
WBC: 6.6 10*3/uL (ref 4.0–10.5)
nRBC: 0 % (ref 0.0–0.2)

## 2019-08-05 LAB — SARS CORONAVIRUS 2 (TAT 6-24 HRS): SARS Coronavirus 2: NEGATIVE

## 2019-08-05 LAB — PROTIME-INR
INR: 0.9 (ref 0.8–1.2)
Prothrombin Time: 11.9 seconds (ref 11.4–15.2)

## 2019-08-05 LAB — SURGICAL PCR SCREEN
MRSA, PCR: NEGATIVE
Staphylococcus aureus: NEGATIVE

## 2019-08-06 ENCOUNTER — Inpatient Hospital Stay: Admission: RE | Admit: 2019-08-06 | Payer: Medicare Other | Source: Ambulatory Visit

## 2019-08-09 ENCOUNTER — Ambulatory Visit: Payer: Medicare Other

## 2019-08-09 ENCOUNTER — Other Ambulatory Visit: Payer: Self-pay

## 2019-08-09 ENCOUNTER — Ambulatory Visit: Payer: Medicare Other | Admitting: Anesthesiology

## 2019-08-09 ENCOUNTER — Encounter
Admission: RE | Disposition: A | Discharge status: Home / Self Care | Payer: Self-pay | Source: Home / Self Care | Attending: Neurosurgery

## 2019-08-09 ENCOUNTER — Encounter: Payer: Self-pay | Admitting: Neurosurgery

## 2019-08-09 ENCOUNTER — Ambulatory Visit
Admission: RE | Admit: 2019-08-09 | Discharge: 2019-08-09 | Disposition: A | Discharge status: Home / Self Care | Payer: Medicare Other | Attending: Neurosurgery | Admitting: Neurosurgery

## 2019-08-09 DIAGNOSIS — Y831 Surgical operation with implant of artificial internal device as the cause of abnormal reaction of the patient, or of later complication, without mention of misadventure at the time of the procedure: Secondary | ICD-10-CM | POA: Insufficient documentation

## 2019-08-09 DIAGNOSIS — J449 Chronic obstructive pulmonary disease, unspecified: Secondary | ICD-10-CM | POA: Insufficient documentation

## 2019-08-09 DIAGNOSIS — I252 Old myocardial infarction: Secondary | ICD-10-CM | POA: Insufficient documentation

## 2019-08-09 DIAGNOSIS — E7800 Pure hypercholesterolemia, unspecified: Secondary | ICD-10-CM | POA: Diagnosis not present

## 2019-08-09 DIAGNOSIS — K219 Gastro-esophageal reflux disease without esophagitis: Secondary | ICD-10-CM | POA: Diagnosis not present

## 2019-08-09 DIAGNOSIS — E785 Hyperlipidemia, unspecified: Secondary | ICD-10-CM | POA: Diagnosis not present

## 2019-08-09 DIAGNOSIS — Z79899 Other long term (current) drug therapy: Secondary | ICD-10-CM | POA: Insufficient documentation

## 2019-08-09 DIAGNOSIS — T85840A Pain due to nervous system prosthetic devices, implants and grafts, initial encounter: Secondary | ICD-10-CM | POA: Insufficient documentation

## 2019-08-09 DIAGNOSIS — E1140 Type 2 diabetes mellitus with diabetic neuropathy, unspecified: Secondary | ICD-10-CM | POA: Diagnosis not present

## 2019-08-09 DIAGNOSIS — Z794 Long term (current) use of insulin: Secondary | ICD-10-CM | POA: Insufficient documentation

## 2019-08-09 DIAGNOSIS — I10 Essential (primary) hypertension: Secondary | ICD-10-CM | POA: Diagnosis not present

## 2019-08-09 DIAGNOSIS — G894 Chronic pain syndrome: Secondary | ICD-10-CM | POA: Insufficient documentation

## 2019-08-09 DIAGNOSIS — G4730 Sleep apnea, unspecified: Secondary | ICD-10-CM | POA: Diagnosis not present

## 2019-08-09 DIAGNOSIS — T85113A Breakdown (mechanical) of implanted electronic neurostimulator, generator, initial encounter: Secondary | ICD-10-CM | POA: Diagnosis present

## 2019-08-09 DIAGNOSIS — Z7902 Long term (current) use of antithrombotics/antiplatelets: Secondary | ICD-10-CM | POA: Insufficient documentation

## 2019-08-09 DIAGNOSIS — I2510 Atherosclerotic heart disease of native coronary artery without angina pectoris: Secondary | ICD-10-CM | POA: Diagnosis not present

## 2019-08-09 DIAGNOSIS — Z419 Encounter for procedure for purposes other than remedying health state, unspecified: Secondary | ICD-10-CM

## 2019-08-09 HISTORY — PX: LUMBAR SPINAL CORD STIMULATOR LEAD REMOVAL: SHX6819

## 2019-08-09 LAB — GLUCOSE, CAPILLARY
Glucose-Capillary: 190 mg/dL — ABNORMAL HIGH (ref 70–99)
Glucose-Capillary: 190 mg/dL — ABNORMAL HIGH (ref 70–99)

## 2019-08-09 SURGERY — LUMBAR SPINAL CORD SIMULATOR LEAD REMOVAL
Anesthesia: General | Site: Back | Laterality: Right

## 2019-08-09 MED ORDER — PROPOFOL 500 MG/50ML IV EMUL
INTRAVENOUS | Status: DC | PRN
  Administered 2019-08-09: 25 ug/kg/min via INTRAVENOUS

## 2019-08-09 MED ORDER — LIDOCAINE HCL (PF) 2 % IJ SOLN
INTRAMUSCULAR | Status: AC
  Filled 2019-08-09: qty 5

## 2019-08-09 MED ORDER — PROPOFOL 10 MG/ML IV BOLUS
INTRAVENOUS | Status: AC
  Filled 2019-08-09: qty 20

## 2019-08-09 MED ORDER — KETAMINE HCL 50 MG/ML IJ SOLN
INTRAMUSCULAR | Status: AC
  Filled 2019-08-09: qty 10

## 2019-08-09 MED ORDER — FENTANYL CITRATE (PF) 100 MCG/2ML IJ SOLN
INTRAMUSCULAR | Status: AC
  Filled 2019-08-09: qty 2

## 2019-08-09 MED ORDER — DEXMEDETOMIDINE HCL IN NACL 200 MCG/50ML IV SOLN
INTRAVENOUS | Status: DC | PRN
Start: 2019-08-09 — End: 2019-08-09
  Administered 2019-08-09 (×2): 12 ug via INTRAVENOUS

## 2019-08-09 MED ORDER — BUPIVACAINE-EPINEPHRINE (PF) 0.5% -1:200000 IJ SOLN
INTRAMUSCULAR | Status: AC
  Filled 2019-08-09: qty 30

## 2019-08-09 MED ORDER — OXYCODONE HCL 5 MG PO TABS
ORAL_TABLET | ORAL | Status: AC
  Filled 2019-08-09: qty 1

## 2019-08-09 MED ORDER — THROMBIN 5000 UNITS EX SOLR
CUTANEOUS | Status: AC
  Filled 2019-08-09: qty 5000

## 2019-08-09 MED ORDER — ORAL CARE MOUTH RINSE: 15.0000 mL | Freq: Once | OROMUCOSAL | Status: AC

## 2019-08-09 MED ORDER — OXYCODONE HCL 5 MG PO TABS
5.0000 mg | ORAL_TABLET | Freq: Once | ORAL | Status: AC | PRN
  Administered 2019-08-09: 5 mg via ORAL

## 2019-08-09 MED ORDER — GLYCOPYRROLATE 0.2 MG/ML IJ SOLN
INTRAMUSCULAR | Status: DC | PRN
  Administered 2019-08-09: .2 mg via INTRAVENOUS

## 2019-08-09 MED ORDER — GLYCOPYRROLATE 0.2 MG/ML IJ SOLN
INTRAMUSCULAR | Status: AC
  Filled 2019-08-09: qty 1

## 2019-08-09 MED ORDER — CHLORHEXIDINE GLUCONATE 0.12 % MT SOLN
OROMUCOSAL | Status: AC
  Administered 2019-08-09: 15 mL via OROMUCOSAL
  Filled 2019-08-09: qty 15

## 2019-08-09 MED ORDER — MIDAZOLAM HCL 5 MG/5ML IJ SOLN
INTRAMUSCULAR | Status: DC | PRN
  Administered 2019-08-09: 2 mg via INTRAVENOUS

## 2019-08-09 MED ORDER — DEXMEDETOMIDINE HCL IN NACL 80 MCG/20ML IV SOLN
INTRAVENOUS | Status: AC
  Filled 2019-08-09: qty 20

## 2019-08-09 MED ORDER — FENTANYL CITRATE (PF) 100 MCG/2ML IJ SOLN
INTRAMUSCULAR | Status: DC | PRN
  Administered 2019-08-09 (×2): 50 ug via INTRAVENOUS

## 2019-08-09 MED ORDER — VANCOMYCIN HCL 1000 MG IV SOLR
INTRAVENOUS | Status: AC
  Filled 2019-08-09: qty 1000

## 2019-08-09 MED ORDER — ACETAMINOPHEN 10 MG/ML IV SOLN: 1000.0000 mg | Freq: Once | INTRAVENOUS | Status: DC | PRN

## 2019-08-09 MED ORDER — PHENYLEPHRINE HCL (PRESSORS) 10 MG/ML IV SOLN
INTRAVENOUS | Status: AC
  Filled 2019-08-09: qty 1

## 2019-08-09 MED ORDER — OXYCODONE HCL 5 MG/5ML PO SOLN: 5.0000 mg | Freq: Once | ORAL | Status: AC | PRN

## 2019-08-09 MED ORDER — NALOXONE HCL 4 MG/0.1ML NA LIQD
NASAL | 1 refills | Status: DC
Start: 2019-08-09 — End: 2020-02-22

## 2019-08-09 MED ORDER — LIDOCAINE 2% (20 MG/ML) 5 ML SYRINGE
INTRAMUSCULAR | Status: DC | PRN
  Administered 2019-08-09: 50 mg via INTRAVENOUS

## 2019-08-09 MED ORDER — CEFAZOLIN SODIUM-DEXTROSE 2-4 GM/100ML-% IV SOLN
INTRAVENOUS | Status: AC
  Filled 2019-08-09: qty 100

## 2019-08-09 MED ORDER — CHLORHEXIDINE GLUCONATE 0.12 % MT SOLN: 15.0000 mL | Freq: Once | OROMUCOSAL | Status: AC

## 2019-08-09 MED ORDER — MIDAZOLAM HCL 2 MG/2ML IJ SOLN
INTRAMUSCULAR | Status: AC
  Filled 2019-08-09: qty 2

## 2019-08-09 MED ORDER — FENTANYL CITRATE (PF) 100 MCG/2ML IJ SOLN: 25.0000 ug | INTRAMUSCULAR | Status: DC | PRN

## 2019-08-09 MED ORDER — ONDANSETRON HCL 4 MG/2ML IJ SOLN: 4.0000 mg | Freq: Once | INTRAMUSCULAR | Status: DC | PRN

## 2019-08-09 MED ORDER — SODIUM CHLORIDE 0.9 % IV SOLN: INTRAVENOUS | Status: DC

## 2019-08-09 MED ORDER — CEFAZOLIN SODIUM-DEXTROSE 2-4 GM/100ML-% IV SOLN
2.0000 g | Freq: Once | INTRAVENOUS | Status: AC
  Administered 2019-08-09: 2 g via INTRAVENOUS

## 2019-08-09 MED ORDER — BUPIVACAINE-EPINEPHRINE (PF) 0.5% -1:200000 IJ SOLN
INTRAMUSCULAR | Status: DC | PRN
  Administered 2019-08-09: 45 mL

## 2019-08-09 SURGICAL SUPPLY — 61 items
BLADE BOVIE TIP EXT 4 (BLADE) ×2 IMPLANT
BUR NEURO DRILL SOFT 3.0X3.8M (BURR) ×2 IMPLANT
CANISTER SUCT 1200ML W/VALVE (MISCELLANEOUS) ×4 IMPLANT
CHLORAPREP W/TINT 26 (MISCELLANEOUS) ×2 IMPLANT
CNTNR SPEC 2.5X3XGRAD LEK (MISCELLANEOUS) ×1
CONT SPEC 4OZ STER OR WHT (MISCELLANEOUS) ×1
CONT SPEC 4OZ STRL OR WHT (MISCELLANEOUS) ×1
CONTAINER SPEC 2.5X3XGRAD LEK (MISCELLANEOUS) ×1 IMPLANT
COUNTER NEEDLE 20/40 LG (NEEDLE) ×2 IMPLANT
COVER LIGHT HANDLE STERIS (MISCELLANEOUS) ×4 IMPLANT
COVER WAND RF STERILE (DRAPES) ×2 IMPLANT
CUP MEDICINE 2OZ PLAST GRAD ST (MISCELLANEOUS) ×2 IMPLANT
DERMABOND ADVANCED (GAUZE/BANDAGES/DRESSINGS) ×1
DERMABOND ADVANCED .7 DNX12 (GAUZE/BANDAGES/DRESSINGS) ×1 IMPLANT
DRAPE C-ARM XRAY 36X54 (DRAPES) ×4 IMPLANT
DRAPE LAPAROTOMY 100X77 ABD (DRAPES) ×2 IMPLANT
DRAPE MICROSCOPE SPINE 48X150 (DRAPES) IMPLANT
DRAPE SURG 17X11 SM STRL (DRAPES) ×2 IMPLANT
DURASEAL APPLICATOR TIP (TIP) IMPLANT
DURASEAL SPINE SEALANT 3ML (MISCELLANEOUS) IMPLANT
ELECT CAUTERY BLADE TIP 2.5 (TIP) ×2
ELECT EZSTD 165MM 6.5IN (MISCELLANEOUS) ×2
ELECT REM PT RETURN 9FT ADLT (ELECTROSURGICAL) ×2
ELECTRODE CAUTERY BLDE TIP 2.5 (TIP) ×1 IMPLANT
ELECTRODE EZSTD 165MM 6.5IN (MISCELLANEOUS) ×1 IMPLANT
ELECTRODE REM PT RTRN 9FT ADLT (ELECTROSURGICAL) ×1 IMPLANT
GAUZE SPONGE 4X4 12PLY STRL (GAUZE/BANDAGES/DRESSINGS) IMPLANT
GLOVE BIOGEL PI IND STRL 7.0 (GLOVE) ×1 IMPLANT
GLOVE BIOGEL PI IND STRL 8 (GLOVE) ×1 IMPLANT
GLOVE BIOGEL PI INDICATOR 7.0 (GLOVE) ×1
GLOVE BIOGEL PI INDICATOR 8 (GLOVE) ×1
GLOVE SURG SYN 7.0 (GLOVE) ×4 IMPLANT
GLOVE SURG SYN 8.0 (GLOVE) ×2 IMPLANT
GOWN STRL REUS W/ TWL LRG LVL3 (GOWN DISPOSABLE) ×1 IMPLANT
GOWN STRL REUS W/ TWL XL LVL3 (GOWN DISPOSABLE) ×2 IMPLANT
GOWN STRL REUS W/TWL LRG LVL3 (GOWN DISPOSABLE) ×1
GOWN STRL REUS W/TWL XL LVL3 (GOWN DISPOSABLE) ×4
GRADUATE 1200CC STRL 31836 (MISCELLANEOUS) ×2 IMPLANT
KIT TURNOVER KIT A (KITS) ×2 IMPLANT
KIT WILSON FRAME (KITS) ×2 IMPLANT
MARKER SKIN DUAL TIP RULER LAB (MISCELLANEOUS) ×2 IMPLANT
NDL SAFETY ECLIPSE 18X1.5 (NEEDLE) ×1 IMPLANT
NEEDLE HYPO 18GX1.5 SHARP (NEEDLE) ×1
NEEDLE HYPO 22GX1.5 SAFETY (NEEDLE) ×2 IMPLANT
NS IRRIG 1000ML POUR BTL (IV SOLUTION) ×2 IMPLANT
PACK LAMINECTOMY NEURO (CUSTOM PROCEDURE TRAY) ×2 IMPLANT
PAD ARMBOARD 7.5X6 YLW CONV (MISCELLANEOUS) ×2 IMPLANT
SPOGE SURGIFLO 8M (HEMOSTASIS)
SPONGE SURGIFLO 8M (HEMOSTASIS) IMPLANT
STAPLER SKIN PROX 35W (STAPLE) IMPLANT
SUT ETHILON 3-0 FS-10 30 BLK (SUTURE) ×4
SUT POLYSORB 2-0 5X18 GS-10 (SUTURE) ×4 IMPLANT
SUT VIC AB 0 CT1 18XCR BRD 8 (SUTURE) ×2 IMPLANT
SUT VIC AB 0 CT1 8-18 (SUTURE) ×4
SUTURE EHLN 3-0 FS-10 30 BLK (SUTURE) ×2 IMPLANT
SYR 10ML LL (SYRINGE) ×4 IMPLANT
SYR 20ML LL LF (SYRINGE) ×2 IMPLANT
SYR 30ML LL (SYRINGE) ×4 IMPLANT
SYR 3ML LL SCALE MARK (SYRINGE) ×2 IMPLANT
TOWEL OR 17X26 4PK STRL BLUE (TOWEL DISPOSABLE) ×4 IMPLANT
TUBING CONNECTING 10 (TUBING) ×2 IMPLANT

## 2019-08-09 NOTE — Anesthesia Postprocedure Evaluation (Signed)
Anesthesia Post Note  Patient: Kenneth Osborn  Procedure(s) Performed: REMOVAL SPINAL CORD STIMULATOR PERCUTANEOUS LEADS, REMOVAL PULSE GENERATOR (Right Back)  Patient location during evaluation: PACU Anesthesia Type: General Level of consciousness: awake and alert Pain management: pain level controlled Vital Signs Assessment: post-procedure vital signs reviewed and stable Respiratory status: spontaneous breathing, nonlabored ventilation, respiratory function stable and patient connected to nasal cannula oxygen Cardiovascular status: blood pressure returned to baseline and stable Postop Assessment: no apparent nausea or vomiting Anesthetic complications: no   No complications documented.   Last Vitals:  Vitals:   08/09/19 0855 08/09/19 0910  BP: 102/64 104/64  Pulse: 78 77  Resp: 17 17  Temp:    SpO2: 97% 97%    Last Pain:  Vitals:   08/09/19 0910  TempSrc:   PainSc: 2                  Arita Miss

## 2019-08-09 NOTE — Discharge Instructions (Addendum)
NEUROSURGERY DISCHARGE INSTRUCTIONS  The following are instructions to help in your recovery once you have been discharged from the hospital. Even if you feel well, it is important that you follow these activity guidelines.  What to do after you leave the hospital:  Recommended diet:  Increase protein intake to promote wound healing. You may return to your usual diet. However, you may experience discomfort when swallowing in the first month after your surgery. This is normal. You may find that softer foods are more comfortable for you to swallow. Be sure to stay hydrated.   Recommended activity: No bending, lifting, or twisting ("BLT"). Avoid lifting objects heavier than 10 pounds (gallon milk jug). Where possible, avoid household activities that involve lifting, bending, reaching, pushing, or pulling such as laundry, vacuuming, grocery shopping, and childcare. Try to arrange for help from friends and family for these activities while you heal.   Increase physical activity slowly as tolerated. Taking short walks is encouraged, but avoid strenuous exercise. Do not jog, run, bicycle, lift weights, or participate in any other exercises unless specifically allowed by your doctor.   You should not drive until cleared by your doctor.   Until released by your doctor, you should not return to work or school. You should rest at home and let your body heal.   You may shower the day after your surgery. After showering, lightly dab your incision dry. Do not take a tub bath or go swimming until approved by your doctor at your follow-up appointment.   If you smoke, we strongly recommend that you quit. Smoking has been proven to interfere with normal bone healing and will dramatically reduce the success rate of your surgery. Please contact [facility] (800-QUIT-NOW) and use the resources at www.[facility].com for assistance in stopping smoking.   Medications  DO NOT START EFFIENT FOR THREE DAYS AFTER  SURGERY.   * Do not take anti-inflammatory medications for 3 days after surgery (naproxen [Aleve], ibuprofen [Advil, Motrin], celecoxib [Celebrex], etc.).   You may restart home medications.   Wound Care Instructions  If you have a dressing on your incision, remove it two days after your surgery. Keep your incision area clean and dry.   If you have staples or stitches on your incision, you should have a follow up scheduled for removal. If you do not have staples or stitches, you will have steri-strips (small pieces of surgical tape) or Dermabond glue. The steri-strips/glue should begin to peel away within about a week (it is fine if the steri-strips fall off before then). If the strips are still in place one week after your surgery, you may gently remove them.    Please Report any of the following: Should you experience any of the following, contact us immediately:   New numbness or weakness   Pain that is progressively getting worse, and is not relieved by your pain medication, muscle relaxers, rest, and warm compresses   Bleeding, redness, swelling, pain, or drainage from surgical incision   Chills or flu-like symptoms   Fever greater than 101.0 F (38.3 C)   Inability to eat, drink fluids, or take medications   Problems with bowel or bladder functions   Difficulty breathing or shortness of breath   Warmth, tenderness, or swelling in your calf    Additional Follow up appointments During office hours (Monday-Friday 9 am to 5 pm), please call your physician at [phone] and ask for Berdine Addison.   After hours and weekends, please call [phone] and an  answering service will put you in touch with either Dr. Lacinda Axon or Dr. Izora Ribas.   For a life-threatening emergency, call [place]   1) The drugs that you were given will stay in your system until tomorrow so for the next 24 hours you should not:  A) Drive an  automobile B) Make any legal decisions C) Drink any alcoholic beverage   2) You may resume regular meals tomorrow.  Today it is better to start with liquids and gradually work up to solid foods.  You may eat anything you prefer, but it is better to start with liquids, then soup and crackers, and gradually work up to solid foods.   3) Please notify your doctor immediately if you have any unusual bleeding, trouble breathing, redness and pain at the surgery site, drainage, fever, or pain not relieved by medication.    4) Additional Instructions:        Please contact your physician with any problems or Same Day Surgery at [phone], Monday through Friday 6 am to 4 pm, or [place] at [facility] number at [phone].

## 2019-08-09 NOTE — Progress Notes (Signed)
   08/09/19 0740  Clinical Encounter Type  Visited With Family  Visit Type Initial  Referral From Other (Comment)  Consult/Referral To Chaplain  While rounding [facility] waiting area, chaplain spoke with patient's wife. She said all is well and chaplain told her if she needs her to have her paged.

## 2019-08-09 NOTE — H&P (Signed)
Kenneth Osborn is an 64 y.o. male.   Chief Complaint: Chronic pain, failed SCS Kenneth Osborn is here for follow-up of his known spinal cord stimulator. We had replaced his pulse generator in order to see if he can get better relief, however he states the stimulator is not helping him in terms of pain. He did have some facet ablations recently he felt like this helped his back pain overall. Currently, the only site of pain in his back is over the pulse generator. He finds it to be very tender and limits the positions he can be in. He presents today for removal of the spinal cord stimulator.   Past Medical History:  Diagnosis Date  . Acute postoperative pain 12/03/2018  . Allergic rhinitis 12/30/2012  . Anginal pain ([place])   . Bronchitis    hx of  . Can't get food down 08/12/2014  . Chronic back pain    thoracic area  . Concussion 09/2015  . COPD (chronic obstructive pulmonary disease) ([place])   . Coronary artery disease    99% blockage  . DDD (degenerative disc disease), cervical   . Dehydration symptoms    2019  . 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 Dysphagia   . GERD (gastroesophageal reflux disease)   . History of Meniere's disease 12/21/2014  . History of thoracic spine surgery (S/P T9-10 IVD spacer) 12/21/2014  . Hypercholesteremia   . Hyperlipidemia   . Hypertension    sees Dr. Jenny Reichmann walker Jefm Bryant  . 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 Myocardial infarction [facility])    Sees Dr. Drema [place], [place] [facility]  . Neuromuscular disorder ([place])    diabetic neuropathy in feet  . Psychosis ([place])   . Short-segment Barrett's esophagus   . Sleep apnea    uses cpap and 2 L o2 at hs    Past Surgical History:  Procedure Laterality Date  . APPENDECTOMY    . ARTHRODESIS ANTERIOR ANTERIOR CERVICLE SPINE  01/04/2013  . BACK SURGERY     fusion thoracic area  . CARDIAC CATHETERIZATION     may 2012 and Nov 20, 2010  . CARDIAC CATHETERIZATION  N/A 06/15/2015   Procedure: Left Heart Cath and Coronary Angiography;  Surgeon: Corey Skains, MD;  Location: [place] CV LAB;  Service: Cardiovascular;  Laterality: N/A;  . CARDIAC CATHETERIZATION N/A 06/15/2015   Procedure: Coronary Stent Intervention;  Surgeon: Isaias Cowman, MD;  Location: [place] CV LAB;  Service: Cardiovascular;  Laterality: N/A;  . CARDIOVASCULAR STRESS TEST     jan 2014  . COLONOSCOPY WITH PROPOFOL N/A 09/19/2014   Procedure: COLONOSCOPY WITH PROPOFOL;  Surgeon: Manya Silvas, MD;  Location: [facility] ENDOSCOPY;  Service: Endoscopy;  Laterality: N/A;  . CORONARY ANGIOPLASTY     stent placement  . ESOPHAGOGASTRODUODENOSCOPY N/A 09/19/2014   Procedure: ESOPHAGOGASTRODUODENOSCOPY (EGD);  Surgeon: Manya Silvas, MD;  Location: [facility] ENDOSCOPY;  Service: Endoscopy;  Laterality: N/A;  . [place] right ear  . mastoid shunt     left, 2002, 1997 right ear, 1980 right ear  . PULSE GENERATOR IMPLANT N/A 01/18/2019   Procedure: MEDTRONIC SPINAL CORD STIMULATOR BATTERY EXCHANGE;  Surgeon: [name], MD;  Location: [facility] ORS;  Service: Neurosurgery;  Laterality: N/A;  . SAVORY DILATION N/A 09/19/2014   Procedure: SAVORY DILATION;  Surgeon: Manya Silvas, MD;  Location: [facility] ENDOSCOPY;  Service: Endoscopy;  Laterality: N/A;  .  SHOULDER ARTHROSCOPY WITH SUBACROMIAL DECOMPRESSION Left 04/06/2012   Procedure: SHOULDER ARTHROSCOPY WITH SUBACROMIAL DECOMPRESSION;  Surgeon: Vickey Huger, MD;  Location: [place];  Service: Orthopedics;  Laterality: Left;  left shoulder arthroscopy, subacromial decompression and distal clavicle resection  . SPINAL CORD STIMULATOR IMPLANT Right     Family History  Problem Relation Age of Onset  . Heart disease Mother   . Diabetes Mother   . Heart disease Father   . Cancer Sister   . Heart disease Maternal Aunt   . Heart disease Maternal Uncle   . Diabetes Maternal Grandmother   . Diabetes Paternal Grandmother    Social  History:  reports that he has never smoked. He has never used smokeless tobacco. He reports that he does not drink alcohol and does not use drugs.  Allergies: No Known Allergies  Medications Prior to Admission  Medication Sig Dispense Refill  . ALPRAZolam (XANAX) 0.5 MG tablet Take 0.5 mg by mouth at bedtime.     . cetirizine (ZYRTEC) 10 MG tablet Take 10 mg by mouth daily.     . cyclobenzaprine (FLEXERIL) 10 MG tablet Take 1 tablet (10 mg total) by mouth 3 (three) times daily as needed for muscle spasms. 270 tablet 1  . diazepam (VALIUM) 5 MG tablet Take 5 mg by mouth 3 (three) times daily as needed (Meniere's disease).     Marland Kitchen diltiazem (CARDIZEM CD) 240 MG 24 hr capsule Take 240 mg by mouth at bedtime.     . enalapril (VASOTEC) 10 MG tablet Take 10 mg by mouth 2 (two) times daily.     Marland Kitchen esomeprazole (NEXIUM) 40 MG capsule Take 40 mg by mouth 2 (two) times daily.    Marland Kitchen ezetimibe (ZETIA) 10 MG tablet Take 10 mg by mouth at bedtime.     . gabapentin (NEURONTIN) 800 MG tablet Take 2 tablets (1,600 mg total) by mouth 2 (two) times daily. 360 tablet 1  . Glucagon (BAQSIMI ONE PACK NA) Place 1 Dose into the nose daily as needed (severely low blood sugar).    Marland Kitchen HUMULIN R U-500 KWIKPEN 500 UNIT/ML kwikpen Inject 40 Units into the skin 3 (three) times daily with meals. (Patient taking differently: Inject 70-80 Units into the skin See admin instructions. Can adjust according to blood sugar Inject 80 units with breakfast, 80 units with lunch, 70 units with dinner)  0  . isosorbide mononitrate (IMDUR) 30 MG 24 hr tablet Take 30 mg by mouth at bedtime.    . Magnesium 400 MG TABS Take 400 mg by mouth daily.    . Magnesium Oxide 500 MG TABS Take 1 tablet (500 mg total) by mouth daily. 90 tablet 1  . meclizine (ANTIVERT) 25 MG tablet Take 25 mg by mouth 3 (three) times daily as needed for dizziness.     . metFORMIN (GLUCOPHAGE) 1000 MG tablet Take 1,000 mg by mouth 2 (two) times daily with a meal.    .  metoprolol succinate (TOPROL-XL) 100 MG 24 hr tablet Take 100 mg by mouth 2 (two) times daily. Take with or immediately following a meal.    . montelukast (SINGULAIR) 10 MG tablet Take 10 mg by mouth at bedtime.     . naloxone (NARCAN) nasal spray 4 mg/0.1 mL Place 1 spray into the nose daily as needed (opioid overdose).    . niacin (NIASPAN) 500 MG CR tablet Take 500 mg by mouth at bedtime.    . nitroGLYCERIN (NITROSTAT) 0.4 MG SL tablet Place 0.4 mg  under the tongue every 5 (five) minutes as needed for chest pain.    . Omega-3 Fatty Acids (FISH OIL) 1200 MG CAPS Take 1,200 mg by mouth 2 (two) times daily.    . Oxycodone HCl 10 MG TABS Take 1 tablet (10 mg total) by mouth every 6 (six) hours as needed. Must last 30 days 120 tablet 0  . [START ON 09/05/2019] Oxycodone HCl 10 MG TABS Take 1 tablet (10 mg total) by mouth every 6 (six) hours as needed. Must last 30 days 120 tablet 0  . [START ON 10/05/2019] Oxycodone HCl 10 MG TABS Take 1 tablet (10 mg total) by mouth every 6 (six) hours as needed. Must last 30 days 120 tablet 0  . prasugrel (EFFIENT) 10 MG TABS Take 10 mg by mouth daily.     . rosuvastatin (CRESTOR) 40 MG tablet Take 40 mg by mouth at bedtime.      Results for orders placed or performed during the hospital encounter of 08/09/19 (from the past 48 hour(s))  Glucose, capillary     Status: Abnormal   Collection Time: 08/09/19  6:29 AM  Result Value Ref Range   Glucose-Capillary 190 (H) 70 - 99 mg/dL    Comment: Glucose reference range applies only to samples taken after fasting for at least 8 hours.   No results found.  Review of Systems General ROS: Negative Respiratory ROS: Negative Cardiovascular ROS: Negative Gastrointestinal ROS: Negative Genito-Urinary ROS: Negative Musculoskeletal ROS: Positive for back pain Neurological ROS: Negative Dermatological ROS: Negative  Blood pressure (!) 143/88, pulse 88, temperature (!) 97.1 F (36.2 C), temperature source Temporal, resp.  rate 16, height 5' 9"  (1.753 m), weight 105.2 kg, SpO2 97 %. Physical Exam  General appearance: Alert, cooperative, in no acute distress CV: Regular rate and rhythm Pulm: Clear to auscultation Back: Well-healed midline incision, well-healed right flank incision, some tenderness noted over this.  Neurologic exam:  Mental status: alertness: alert, affect: normal Speech: fluent and clear Motor: Grossly symmetric strength throughout bilateral lower extremities Sensory: intact to light touch in all extremities Gait: normal     Imaging: Thoracolumbar x-rays: There is evidence of 2 percutaneous leads in the lower thoracic spine with electrodes cold in the mid lumbar area. There is a right flank pulse generator.   Assessment/Plan Plan for removal of SCS  [name], MD 08/09/2019, 6:45 AM

## 2019-08-09 NOTE — Interval H&P Note (Signed)
History and Physical Interval Note:  08/09/2019 6:47 AM  Kenneth Osborn  has presented today for surgery, with the diagnosis of chronic pain, failed spinal cord stimulator.  The various methods of treatment have been discussed with the patient and family. After consideration of risks, benefits and other options for treatment, the patient has consented to  Procedure(s) with comments: [place] (Right) - LOCAL WITH MAC as a surgical intervention.  The patient's history has been reviewed, patient examined, no change in status, stable for surgery.  I have reviewed the patient's chart and labs.  Questions were answered to the patient's satisfaction.     [name]

## 2019-08-09 NOTE — Op Note (Signed)
Operative Note  SURGERY DATE:08/09/2019  PRE-OP DIAGNOSIS: Chronic pain syndrome, failed spinal cord stimulator  POST-OP DIAGNOSIS:Post-Op Diagnosis Codes: Chronic pain syndrome, failed spinal cord stimulator  Procedure(s) with comments: Removal of percutaneous spinal cord stimulator leads Removal of pulse generator  SURGEON:  * Malen Gauze, MD Lonell Face - assistant   ANESTHESIA:IV analgesic  OPERATIVE FINDINGS:Removal of bilateral SCS leads and pulse generator  Indication Kenneth Osborn seen in clinic on5/25 with back and leg pain without relief from his current spinal cord stimulator.  He had had a replacement of the pulse generator previously but the presence of this was actually causing him more pain..  We therefore recommended removal of the system and he wanted to proceed. Risks including weakness, hematoma, infection were discussed.   Procedure The patient was brought to the operating room where vascular access was obtained andIV sedation provided. He wasplaced prone on gel rolls. Antibiotics were given.  The patient was prepped and draped in a sterile fashion..A hard time out was performed. Local anesthetic was instilled intoplannedincisionsites.   The midline lumbar incision was opened and taken to the fascia using blunt dissection.  The lead anchors were identified and these were dissected free from the surrounding tissue.  The proximal leads were then retracted without resistance.  The tips were seen to be intact.  The leads were then cut at the incision.  Next, the right flank incision was opened and taken to the level of the pulse generator.  This was dissected free and the retaining suture cut.  The battery and attached leads were removed from the pocket ensuring the remaining proximal lead was removed.    A final fluoroscopic image was taken to show no remaining hardware.  The incisions were irrigated with saline.   Hemostasis was obtained.Next, 2-0 Vicryls were used to close the incisions with 3-0 nylon on the skin.  Sterile dressings were applied. The patient was returned to supine positionand the patient was seen to be moving all extremities symmetrically and was taken to PACU for recovery. The family was updated and all questions answered.   ESTIMATED BLOOD LOSS: 10cc  SPECIMENS Explanted pulse generator and SCS leads     I performed the case in its entiretywithassistance of Lonell Face, NP  [name], [place]

## 2019-08-09 NOTE — Anesthesia Preprocedure Evaluation (Signed)
Anesthesia Evaluation  Patient identified by MRN, date of birth, ID band Patient awake    Reviewed: Allergy & Precautions, H&P , NPO status , Patient's Chart, lab work & pertinent test results  History of Anesthesia Complications Negative for: history of anesthetic complications  Airway Mallampati: III  TM Distance: <3 FB Neck ROM: limited    Dental  (+) Chipped, Poor Dentition   Pulmonary sleep apnea , COPD,           Cardiovascular Exercise Tolerance: Good hypertension, (-) angina+ CAD, + Past MI and + Cardiac Stents  (-) DOE      Neuro/Psych  Headaches, PSYCHIATRIC DISORDERS  Neuromuscular disease    GI/Hepatic Neg liver ROS, GERD  Medicated and Controlled,  Endo/Other  diabetes, Type 2, Insulin Dependent  Renal/GU Renal disease  negative genitourinary   Musculoskeletal   Abdominal   Peds  Hematology negative hematology ROS (+)   Anesthesia Other Findings Past Medical History: 12/30/2012: Allergic rhinitis No date: Bronchitis     Comment:  hx of 08/12/2014: Can't get food down No date: Chronic back pain     Comment:  thoracic area 09/2015: Concussion No date: COPD (chronic obstructive pulmonary disease) (HCC) No date: Coronary artery disease     Comment:  99% blockage No date: DDD (degenerative disc disease), cervical No date: Dehydration symptoms     Comment:  2019 No date: Diabetes mellitus without complication (HCC)     Comment:  insulin dependent No date: Dysphagia No date: GERD (gastroesophageal reflux disease) 12/21/2014: History of Meniere's disease 12/21/2014: History of thoracic spine surgery (S/P T9-10 IVD spacer) No date: Hypercholesteremia No date: Hyperlipidemia No date: Hypertension     Comment:  sees Dr. Jenny Reichmann walker [facility] No date: Meniere's disease     Comment:  deaf in right ear, takes diazepam No date: Myocardial infarction [facility])     Comment:  Sees Dr. Drema [facility], [facility] No date: Neuromuscular disorder [facility])     Comment:  diabetic neuropathy in feet No date: Psychosis ([place]) No date: Short-segment Barrett's esophagus No date: Sleep apnea     Comment:  uses cpap and 2 L o2 at hs  Past Surgical History: No date: APPENDECTOMY 01/04/2013: ARTHRODESIS ANTERIOR ANTERIOR CERVICLE SPINE No date: BACK SURGERY     Comment:  fusion thoracic area No date: CARDIAC CATHETERIZATION     Comment:  may 2012 and Nov 20, 2010 06/15/2015: CARDIAC CATHETERIZATION; N/A     Comment:  Procedure: Left Heart Cath and Coronary Angiography;                Surgeon: Corey Skains, MD;  Location: [place]               CV LAB;  Service: Cardiovascular;  Laterality: N/A; 06/15/2015: CARDIAC CATHETERIZATION; N/A     Comment:  Procedure: Coronary Stent Intervention;  Surgeon:               Isaias Cowman, MD;  Location: [place] CV LAB;              Service: Cardiovascular;  Laterality: N/A; No date: CARDIOVASCULAR STRESS TEST     Comment:  jan 2014 09/19/2014: COLONOSCOPY WITH PROPOFOL; N/A     Comment:  Procedure: COLONOSCOPY WITH PROPOFOL;  Surgeon: Manya Silvas, MD;  Location: [facility] ENDOSCOPY;  Service:  Endoscopy;  Laterality: N/A; No date: CORONARY ANGIOPLASTY     Comment:  stent placement 09/19/2014: ESOPHAGOGASTRODUODENOSCOPY; N/A     Comment:  Procedure: ESOPHAGOGASTRODUODENOSCOPY (EGD);  Surgeon:               Manya Silvas, MD;  Location: [facility] ENDOSCOPY;                Service: Endoscopy;  Laterality: N/A; No date: LABRINTHECTOMY     Comment:  1999 right ear No date: mastoid shunt     Comment:  left, 2002, 1997 right ear, 1980 right ear 09/19/2014: SAVORY DILATION; N/A     Comment:  Procedure: SAVORY DILATION;  Surgeon: Manya Silvas,               MD;  Location: [place];  Service: Endoscopy;                Laterality: N/A; 04/06/2012: SHOULDER ARTHROSCOPY WITH SUBACROMIAL DECOMPRESSION; Left     Comment:   Procedure: SHOULDER ARTHROSCOPY WITH SUBACROMIAL               DECOMPRESSION;  Surgeon: Vickey Huger, MD;  Location: [place];  Service: Orthopedics;  Laterality: Left;  left               shoulder arthroscopy, subacromial decompression and               distal clavicle resection No date: SPINAL CORD STIMULATOR IMPLANT; Right     Reproductive/Obstetrics negative OB ROS                             Anesthesia Physical  Anesthesia Plan  ASA: III  Anesthesia Plan: MAC   Post-op Pain Management:    Induction: Intravenous  PONV Risk Score and Plan: 1 and Propofol infusion and TIVA  Airway Management Planned: Natural Airway and Nasal Cannula  Additional Equipment:   Intra-op Plan:   Post-operative Plan:   Informed Consent: I have reviewed the patients History and Physical, chart, labs and discussed the procedure including the risks, benefits and alternatives for the proposed anesthesia with the patient or authorized representative who has indicated his/her understanding and acceptance.     Dental Advisory Given  Plan Discussed with: Anesthesiologist, CRNA and Surgeon  Anesthesia Plan Comments: (Patient consented for risks of anesthesia including but not limited to:  - adverse reactions to medications - risk of intubation if required - damage to teeth, lips or other oral mucosa - sore throat or hoarseness - Damage to heart, brain, lungs or loss of life  Patient voiced understanding.)        Anesthesia Quick Evaluation

## 2019-08-09 NOTE — Transfer of Care (Signed)
Immediate Anesthesia Transfer of Care Note  Patient: Kenneth Osborn  Procedure(s) Performed: REMOVAL SPINAL CORD STIMULATOR PERCUTANEOUS LEADS, REMOVAL PULSE GENERATOR (Right Back)  Patient Location: PACU  Anesthesia Type:General  Level of Consciousness: awake, alert  and oriented  Airway & Oxygen Therapy: Patient Spontanous Breathing and Patient connected to nasal cannula oxygen  Post-op Assessment: Report given to RN and Post -op Vital signs reviewed and stable  Post vital signs: Reviewed  Last Vitals:  Vitals Value Taken Time  BP    Temp    Pulse 89 08/09/19 0825  Resp    SpO2 97 % 08/09/19 0825  Vitals shown include unvalidated device data.  Last Pain:  Vitals:   08/09/19 0627  TempSrc: Temporal  PainSc: 3       Patients Stated Pain Goal: 0 (06/89/34 0684)  Complications: No complications documented.

## 2019-08-09 NOTE — Discharge Summary (Signed)
Procedure: Removal of SCS Procedure date: 08/09/2019 Diagnosis: chronic pain   History: Kenneth Osborn is s/p removal of SCS POD0: Tolerated procedure well. Evaluated in post op recovery still disoriented from anesthesia but able to answer questions and obey commands.   Physical Exam: Vitals:   08/09/19 0627 08/09/19 0825  BP: (!) 143/88 (!) 130/45  Pulse: 88 89  Resp: 16 17  Temp: (!) 97.1 F (36.2 C) 98.5 F (36.9 C)  SpO2: 97% 97%    General: Alert and oriented, lying in bed Strength:5/5 throughout  Sensation: intact and symmetric throughout  Skin: incisions with dressings to back clean, dry, intact  Data:  No results for input(s): NA, K, CL, CO2, BUN, CREATININE, LABGLOM, GLUCOSE, CALCIUM in the last 168 hours. No results for input(s): AST, ALT, ALKPHOS in the last 168 hours.  Invalid input(s): TBILI   Recent Labs  Lab 08/05/19 1138  WBC 6.6  HGB 14.4  HCT 41.4  PLT 214   Recent Labs  Lab 08/05/19 1138  APTT 27  INR 0.9          Assessment/Plan:  Kenneth Osborn is POD0 s/p explantation of SCS.   Once able to tolerate PO, ambulate, and urinate, pt is appropriate for discharge.  He must hold his Effient for three days after surgery.    Lonell Face, NP Department of Neurosurgery

## 2019-08-10 ENCOUNTER — Encounter: Payer: Self-pay | Admitting: Neurosurgery

## 2019-08-10 ENCOUNTER — Telehealth: Payer: Self-pay | Admitting: *Deleted

## 2019-08-10 NOTE — Telephone Encounter (Signed)
Spoke with patient and informed him that the surgeon that removed the stimulator should manage the post op pain medication.  Patient states understanding.

## 2019-11-08 ENCOUNTER — Ambulatory Visit: Payer: Medicare Other | Admitting: Pain Medicine

## 2019-11-21 NOTE — Progress Notes (Signed)
PROVIDER NOTE: Information contained herein reflects review and annotations entered in association with encounter. Interpretation of such information and data should be left to medically-trained personnel. Information provided to patient can be located elsewhere in the medical record under "Patient Instructions". Document created using STT-dictation technology, any transcriptional errors that may result from process are unintentional.    Patient: Kenneth Osborn  Service Category: E/M  Provider: Gaspar Cola, MD  DOB: January 04, 1956  DOS: 11/22/2019  Specialty: Interventional Pain Management  MRN: [id number]  Setting: Ambulatory outpatient  PCP: [name], MD  Type: Established Patient    Referring Provider: [name], MD  Location: Office  Delivery: Face-to-face     HPI  Kenneth Osborn, a 64 y.o. year old male, is here today because of his Chronic pain syndrome [G89.4]. Kenneth Osborn primary complain today is Back Pain Last encounter: My last encounter with him was on Visit date not found. 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 Beverely Pace at [facility] on 01/04/2013); Neurogenic pain; Thoracic facet syndrome (T8-10); Lumbar facet syndrome (Bilateral) (R>L); Cervical facet syndrome (Right); Cervical spondylosis; Lumbar spondylosis; Chronic upper extremity pain (Left); Chronic cervical radicular pain (Left); Chronic upper back pain; History of thoracic spine surgery (S/P T9-10 IVD spacer); Failed back surgical syndrome; Chronic musculoskeletal pain; Myofascial pain; Chronic lower extremity pain (Left); Chronic radicular lumbar pain (left L4 dermatomal pain); Arthralgia of shoulder; Chronic tension-type headache, not intractable; 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 Pain Assessment: Severity of Chronic pain is reported as a 2 /10. Location: Back Right/pain radiaties down right side to his right foot. Onset: More than a month ago. Quality: [place], [place], [place], [place]. Timing: Constant. Modifying factor(s): meds and rest. Vitals:  height is _0  (1.753 m) and weight is 235 lb (106.6 kg). His temperature is 97.5 F (36.4 C) (abnormal). His blood pressure is 139/74 and his pulse is 87. His oxygen saturation is 98%.   Reason for encounter: medication management.  The patient indicates doing well with the current medication regimen. No adverse reactions or side effects reported to the medications.  As per the patient's plans, he had the lumbar spinal cord stimulator removed on 08/09/2019.  He indicates that he is still having the pain and spasms after the removal of the spinal cord stimulator which would indicate that the device was not responsible for those.  I had previously warned him that it was very likely that the symptoms were coming from the facet joint syndrome.  He now realizes that this is the case and he wants to go ahead and have the radiofrequency repeated.  We did both sides around October 2020.  He indicates that this provided him with significant benefit and therefore he wants to continue with that type of therapy.  Pertinent physical exam today was positive for pain returning to the area of the lumbar facets as  demonstrated by exact reproduction  of the patient's pain on hyperextension and rotation provocative maneuvers.  Medical Necessity: Kenneth Osborn has experienced debilitating chronic pain from the Lumbosacral Facet Syndrome (Spondylosis without myelopathy or radiculopathy, lumbosacral region [M47.817]) that has persisted for longer than three months of failed non-surgical care and has either failed to respond, or was unable to tolerate, or simply did not get enough benefit from other more conservative therapies including, but not limited to: 1. Over-the-counter oral analgesic medications (i.e.: ibuprofen, naproxen, etc.) 2. Anti-inflammatory medications 3. Muscle relaxants 4. Membrane stabilizers 5. Opioids 6. Physical therapy (PT), chiropractic manipulation, and/or home exercise program (HEP). 7. Modalities (Heat, ice, etc.) 8. Invasive techniques such as nerve blocks and/or surgery.  Kenneth Osborn has attained greater than 50% reduction in pain from at least two (2) diagnostic medial branch blocks conducted in separate occasions. For this reason, I believe it is medically necessary to proceed with Non-Pulsed Radiofrequency Ablation for the purpose of attempting to prolong the duration of the benefits seen with the diagnostic injections.  RTCB: 02/20/2020  Transfer nonopioids: Magnesium oxide 500 mg tablets, 1 tab p.o. daily (30/month); cyclobenzaprine (Flexeril) 10 mg, 1 tab p.o. 3 times daily (90/month); gabapentin (Neurontin) 800 mg tablet, 1 tablet p.o. 4 times daily (120/month)  Pharmacotherapy Assessment   Analgesic: Oxycodone IR 10 mg, 1 tab PO q 6 hrs (40 mg/day of oxycodone) MME/day:60 mg/day.   Monitoring: [place] PMP: PDMP reviewed during this encounter.       Pharmacotherapy: No side-effects or adverse reactions reported. Compliance: No problems identified. Effectiveness: Clinically acceptable.  Chauncey Fischer, RN  11/22/2019  8:20 AM  Sign when Signing Visit Nursing Pain Medication  Assessment:  Safety precautions to be maintained throughout the outpatient stay will include: orient to surroundings, keep bed in low position, maintain call bell within reach at all times, provide assistance with transfer out of bed and ambulation.  Medication Inspection Compliance: Pill count conducted under aseptic conditions, in front of the patient. Neither the pills nor the bottle was removed from the patient's sight at any time. Once count was completed pills were immediately returned to the patient in their original bottle.  Medication: Oxycodone IR Pill/Patch Count: 71 of 120 pills remain Pill/Patch Appearance: Markings consistent with prescribed medication Bottle Appearance: Standard pharmacy container. Clearly labeled. Filled Date: 61 / 77 / 21 Last Medication intake:  TodaySafety precautions to be maintained throughout the outpatient stay will include: orient to surroundings, keep bed in low position, maintain call bell within reach at all times, provide assistance with transfer out of bed and ambulation.     UDS:  Summary  Date Value Ref Range Status  03/18/2018 FINAL  Final    Comment:    ==================================================================== TOXASSURE SELECT 13 (MW) ==================================================================== Test                             Result       Flag       Units Drug Present and Declared for Prescription Verification   Desmethyldiazepam              123          EXPECTED   ng/mg creat   Oxazepam                       >897         EXPECTED   ng/mg creat   Temazepam  291          EXPECTED   ng/mg creat    Desmethyldiazepam, oxazepam, and temazepam are expected    metabolites of diazepam. Desmethyldiazepam and oxazepam are also    expected metabolites of other drugs, including chlordiazepoxide,    prazepam, clorazepate, and halazepam. Oxazepam is an expected    metabolite of temazepam. Oxazepam and temazepam  are also    available as scheduled prescription medications.   Oxycodone                      1956         EXPECTED   ng/mg creat   Oxymorphone                    4052         EXPECTED   ng/mg creat   Noroxycodone                   957          EXPECTED   ng/mg creat   Noroxymorphone                 485          EXPECTED   ng/mg creat    Sources of oxycodone are scheduled prescription medications.    Oxymorphone, noroxycodone, and noroxymorphone are expected    metabolites of oxycodone. Oxymorphone is also available as a    scheduled prescription medication. Drug Absent but Declared for Prescription Verification   Alprazolam                     Not Detected UNEXPECTED ng/mg creat ==================================================================== Test                      Result    Flag   Units      Ref Range   Creatinine              223              mg/dL      >=20 ==================================================================== Declared Medications:  The flagging and interpretation on this report are based on the  following declared medications.  Unexpected results may arise from  inaccuracies in the declared medications.  **Note: The testing scope of this panel includes these medications:  Alprazolam (Xanax)  Diazepam (Valium)  Oxycodone  **Note: The testing scope of this panel does not include following  reported medications:  Aspirin  Cetirizine (Zyrtec)  Cyclobenzaprine (Flexeril)  Diltiazem (Cardizem)  Ezetimibe (Zetia)  Gabapentin (Neurontin)  Insulin (Humulin)  Isosorbide (Imdur)  Magnesium  Meclizine (Antivert)  Metformin (Glucophage)  Metoprolol (Toprol)  Montelukast (Singulair)  Naloxone (Narcan)  Niacin (Niaspan)  Nitroglycerin (Nitrostat)  Omega-3 Fatty Acids (Fish Oil)  Omeprazole (Nexium)  Prasugrel (Effient)  Rosuvastatin (Crestor) ==================================================================== For clinical consultation, please call [phone]. ====================================================================      ROS  Constitutional: Denies any fever or chills Gastrointestinal: No reported hemesis, hematochezia, vomiting, or acute GI distress Musculoskeletal: Denies any acute onset joint swelling, redness, loss of ROM, or weakness Neurological: No reported episodes of acute onset apraxia, aphasia, dysarthria, agnosia, amnesia, paralysis, loss of coordination, or loss of consciousness  Medication Review  ALPRAZolam, Fish Oil, Glucagon, Magnesium, Magnesium Oxide, Oxycodone HCl, cetirizine, cyclobenzaprine, diazepam, diltiazem, enalapril, esomeprazole, ezetimibe, gabapentin, insulin regular human CONCENTRATED, isosorbide mononitrate, meclizine, metFORMIN, metoprolol succinate, montelukast, naloxone, niacin, nitroGLYCERIN, and rosuvastatin  History Review  Allergy: Kenneth Osborn has No Known Allergies. Drug: Kenneth Osborn  reports no history of drug use. Alcohol:  reports no history of alcohol use. Tobacco:  reports that he has never smoked. He has never used smokeless tobacco. Social: Kenneth Osborn  reports that he has never smoked. He has never used smokeless tobacco. He reports that he does not drink alcohol and does not use drugs. Medical:  has a past medical history of Acute postoperative pain (12/03/2018), Allergic rhinitis (12/30/2012), Anginal pain ([place]), Bronchitis, Can't get food down (08/12/2014), Chronic back pain, Concussion (09/2015), COPD (chronic obstructive pulmonary disease) ([place]), Coronary artery disease, DDD (degenerative disc disease), cervical, Dehydration symptoms, Diabetes mellitus without complication ([place]), Dysphagia, GERD (gastroesophageal reflux disease), History of Meniere's disease (12/21/2014), History of thoracic spine surgery (S/P T9-10 IVD spacer) (12/21/2014), Hypercholesteremia, Hyperlipidemia, Hypertension, Meniere's disease, Myocardial infarction ([place]), Neuromuscular disorder ([place]), Psychosis ([place]),  Short-segment Barrett's esophagus, and Sleep apnea. Surgical: Kenneth Osborn  has a past surgical history that includes Cardiovascular stress test; Cardiac catheterization; Coronary angioplasty; Labrinthectomy; mastoid shunt; Appendectomy; Back surgery; Shoulder arthroscopy with subacromial decompression (Left, 04/06/2012); ARTHRODESIS ANTERIOR ANTERIOR CERVICLE SPINE (01/04/2013); Colonoscopy with propofol (N/A, 09/19/2014); Esophagogastroduodenoscopy (N/A, 09/19/2014); Savory dilation (N/A, 09/19/2014); Spinal cord stimulator implant (Right); Cardiac catheterization (N/A, 06/15/2015); Cardiac catheterization (N/A, 06/15/2015); Pulse generator implant (N/A, 01/18/2019); and Lumbar spinal cord simulator lead removal (Right, 08/09/2019). Family: family history includes Cancer in his sister; Diabetes in his maternal grandmother, mother, and paternal grandmother; Heart disease in his father, maternal aunt, maternal uncle, and mother.  Laboratory Chemistry Profile   Renal Lab Results  Component Value Date   BUN 20 01/08/2019   CREATININE 1.46 (H) 01/08/2019   GFRAA 58 (L) 01/08/2019   GFRNONAA 50 (L) 01/08/2019     Hepatic Lab Results  Component Value Date   AST 31 01/18/2018   ALT 33 01/18/2018   ALBUMIN 4.1 01/18/2018   ALKPHOS 68 01/18/2018     Electrolytes Lab Results  Component Value Date   NA 139 01/08/2019   K 4.4 01/08/2019   CL 101 01/08/2019   CALCIUM 9.7 01/08/2019   MG 1.7 03/22/2015     Bone No results found for: VD25OH, VD125OH2TOT, MB8466ZL9, JT7017BL3, 25OHVITD1, 25OHVITD2, 25OHVITD3, TESTOFREE, TESTOSTERONE   Inflammation (CRP: Acute Phase) (ESR: Chronic Phase) Lab Results  Component Value Date   CRP 1.4 (H) 03/22/2015   ESRSEDRATE 9 03/22/2015       Note: Above Lab results reviewed.  Recent Imaging Review  DG Lumbar Spine 2-3 Views CLINICAL DATA:  Removal of spinal stimulator  EXAM: LUMBAR SPINE - 2-3 VIEW; DG C-ARM 1-60 MIN  COMPARISON:  None.  FLUOROSCOPY TIME:   Radiation Exposure Index (as provided by the fluoroscopic device): 2.05 mGy  If the device does not provide the exposure index:  Fluoroscopy Time:  10 seconds  Number of Acquired Images:  3  FINDINGS: Three spot films were obtained and reveal interval removal of a thoracic spinal stimulator. No residual components are noted.  IMPRESSION: Removal of spinal stimulator.  Electronically Signed   By: Inez Catalina M.D.   On: 08/09/2019 09:40 DG C-Arm 1-60 Min CLINICAL DATA:  Removal of spinal stimulator  EXAM: LUMBAR SPINE - 2-3 VIEW; DG C-ARM 1-60 MIN  COMPARISON:  None.  FLUOROSCOPY TIME:  Radiation Exposure Index (as provided by the fluoroscopic device): 2.05 mGy  If the device does not provide the exposure index:  Fluoroscopy Time:  10 seconds  Number of Acquired Images:  3  FINDINGS: Three  spot films were obtained and reveal interval removal of a thoracic spinal stimulator. No residual components are noted.  IMPRESSION: Removal of spinal stimulator.  Electronically Signed   By: Inez Catalina M.D.   On: 08/09/2019 09:40 Note: Reviewed        Physical Exam  General appearance: Well nourished, well developed, and well hydrated. In no apparent acute distress Mental status: Alert, oriented x 3 (person, place, & time)       Respiratory: No evidence of acute respiratory distress Eyes: PERLA Vitals: BP 139/74   Pulse 87   Temp (!) 97.5 F (36.4 C)   Ht _0  (1.753 m)   Wt 235 lb (106.6 kg)   SpO2 98%   BMI 34.70 kg/m  BMI: Estimated body mass index is 34.7 kg/m as calculated from the following:   Height as of this encounter: _1  (1.753 m).   Weight as of this encounter: 235 lb (106.6 kg). Ideal: Ideal body weight: 70.7 kg (155 lb 13.8 oz) Adjusted ideal body weight: 85.1 kg (187 lb 8.3 oz)  Assessment   Status Diagnosis  Controlled Controlled Controlled 1. Chronic pain syndrome   2. Chronic low back pain (Primary Area of Pain) (Bilateral) (L>R)    3. Lumbar facet syndrome (Bilateral) (R>L)   4. Failed back surgical syndrome   5. Pharmacologic therapy   6. Musculoskeletal pain   7. Chronic musculoskeletal pain   8. Gastroesophageal reflux disease without esophagitis   9. Barrett's esophagus without dysplasia   10. Neuropathic pain   11. Diabetic polyneuropathy associated with diabetes mellitus due to underlying condition ([place])   12. Polyneuropathy   13. Chronic anticoagulation (Effient)      Updated Problems: No problems updated.  Plan of Care  Problem-specific:  No problem-specific Assessment & Plan notes found for this encounter.  Kenneth Osborn has a current medication list which includes the following long-term medication(s): cetirizine, esomeprazole, ezetimibe, humulin r u-500 kwikpen, metoprolol succinate, montelukast, niacin, [START ON 12/21/2019] cyclobenzaprine, [START ON 12/21/2019] gabapentin, [START ON 12/21/2019] magnesium oxide, oxycodone hcl, [START ON 12/22/2019] oxycodone hcl, and [START ON 01/21/2020] oxycodone hcl.  Pharmacotherapy (Medications Ordered): Meds ordered this encounter  Medications  . Oxycodone HCl 10 MG TABS    Sig: Take 1 tablet (10 mg total) by mouth every 6 (six) hours as needed. Must last 30 days    Dispense:  120 tablet    Refill:  0    Chronic Pain: STOP Act (Not applicable) Fill 1 day early if closed on refill date. Avoid benzodiazepines within 8 hours of opioids  . Oxycodone HCl 10 MG TABS    Sig: Take 1 tablet (10 mg total) by mouth every 6 (six) hours as needed. Must last 30 days    Dispense:  120 tablet    Refill:  0    Chronic Pain: STOP Act (Not applicable) Fill 1 day early if closed on refill date. Avoid benzodiazepines within 8 hours of opioids  . Oxycodone HCl 10 MG TABS    Sig: Take 1 tablet (10 mg total) by mouth every 6 (six) hours as needed. Must last 30 days    Dispense:  120 tablet    Refill:  0    Chronic Pain: STOP Act (Not applicable) Fill 1 day early if closed  on refill date. Avoid benzodiazepines within 8 hours of opioids  . Magnesium Oxide 500 MG TABS    Sig: Take 1 tablet (500 mg total) by mouth daily.  Dispense:  90 tablet    Refill:  0    Fill one day early if pharmacy is closed on scheduled refill date. May substitute for generic if available.  . cyclobenzaprine (FLEXERIL) 10 MG tablet    Sig: Take 1 tablet (10 mg total) by mouth 3 (three) times daily as needed for muscle spasms.    Dispense:  270 tablet    Refill:  0    Fill one day early if pharmacy is closed on scheduled refill date. May substitute for generic if available.  . gabapentin (NEURONTIN) 800 MG tablet    Sig: Take 2 tablets (1,600 mg total) by mouth 2 (two) times daily.    Dispense:  360 tablet    Refill:  0    Fill one day early if pharmacy is closed on scheduled refill date. May substitute for generic if available.   Orders:  Orders Placed This Encounter  Procedures  . Radiofrequency,Lumbar    Standing Status:   Future    Standing Expiration Date:   11/21/2020    Scheduling Instructions:     Side(s): Right-sided     Level: L3-4, L4-5, & L5-S1 Facets (L2, L3, L4, L5, & S1 Medial Branch Nerves)     Sedation: Patient's choice.     Scheduling Timeframe: As soon as pre-approved    Order Specific Question:   Where will this procedure be performed?    Answer:   [facility]  . Blood Thinner Instructions to Nursing    If the patient requires a Lovenox-bridge therapy, make sure arrangements are made to institute it with the assistance of the PCP.    Standing Status:   Standing    Number of Occurrences:   36    Standing Expiration Date:   11/21/2020    Scheduling Instructions:     Always stop the Effient (Prasugrel) x 10 days prior to procedure or surgery.   Follow-up plan:   Return for Radio-Frequency: (R) L-FCT RFA #2, (Blood Thinner Protocol).      Considering: NOTE: EFFIENT Anticoagulation (Stop: 7-10 days  Re-start: 6 hrs)    Palliative PRN  treatment(s): Palliative right PSIS MNB/TPI  Diagnostic/palliative right lumbar facet block #3  Diagnostic/palliative left lumbar facet block #2  Palliative left lumbar facet RFA #2 (last done 12/03/2018)  Palliative right lumbar facet RFA #2 (last done 12/17/2018)     Recent Visits No visits were found meeting these conditions. Showing recent visits within past 90 days and meeting all other requirements Today's Visits Date Type Provider Dept  11/22/19 Office Visit Milinda Pointer, MD [facility]  Showing today's visits and meeting all other requirements Future Appointments Date Type Provider Dept  12/02/19 Appointment Milinda Pointer, MD [facility]  Showing future appointments within next 90 days and meeting all other requirements  I discussed the assessment and treatment plan with the patient. The patient was provided an opportunity to ask questions and all were answered. The patient agreed with the plan and demonstrated an understanding of the instructions.  Patient advised to call back or seek an in-person evaluation if the symptoms or condition worsens.  Duration of encounter: 30 minutes.  Note by: Gaspar Cola, MD Date: 11/22/2019; Time: 9:26 AM

## 2019-11-22 ENCOUNTER — Ambulatory Visit: Payer: Medicare Other | Attending: Pain Medicine | Admitting: Pain Medicine

## 2019-11-22 ENCOUNTER — Encounter: Payer: Self-pay | Admitting: Pain Medicine

## 2019-11-22 ENCOUNTER — Other Ambulatory Visit: Payer: Self-pay

## 2019-11-22 VITALS — BP 139/74 | HR 87 | Temp 97.5°F | Ht 69.0 in | Wt 235.0 lb

## 2019-11-22 DIAGNOSIS — M47816 Spondylosis without myelopathy or radiculopathy, lumbar region: Secondary | ICD-10-CM

## 2019-11-22 DIAGNOSIS — M961 Postlaminectomy syndrome, not elsewhere classified: Secondary | ICD-10-CM

## 2019-11-22 DIAGNOSIS — K2270 Barrett's esophagus without dysplasia: Secondary | ICD-10-CM

## 2019-11-22 DIAGNOSIS — K219 Gastro-esophageal reflux disease without esophagitis: Secondary | ICD-10-CM | POA: Diagnosis present

## 2019-11-22 DIAGNOSIS — G8929 Other chronic pain: Secondary | ICD-10-CM

## 2019-11-22 DIAGNOSIS — G629 Polyneuropathy, unspecified: Secondary | ICD-10-CM | POA: Diagnosis present

## 2019-11-22 DIAGNOSIS — G894 Chronic pain syndrome: Secondary | ICD-10-CM

## 2019-11-22 DIAGNOSIS — M7918 Myalgia, other site: Secondary | ICD-10-CM | POA: Diagnosis present

## 2019-11-22 DIAGNOSIS — E0842 Diabetes mellitus due to underlying condition with diabetic polyneuropathy: Secondary | ICD-10-CM

## 2019-11-22 DIAGNOSIS — Z79899 Other long term (current) drug therapy: Secondary | ICD-10-CM

## 2019-11-22 DIAGNOSIS — M5442 Lumbago with sciatica, left side: Secondary | ICD-10-CM | POA: Insufficient documentation

## 2019-11-22 DIAGNOSIS — Z7901 Long term (current) use of anticoagulants: Secondary | ICD-10-CM | POA: Diagnosis present

## 2019-11-22 DIAGNOSIS — M792 Neuralgia and neuritis, unspecified: Secondary | ICD-10-CM

## 2019-11-22 MED ORDER — CYCLOBENZAPRINE HCL 10 MG PO TABS: 10.0000 mg | ORAL_TABLET | Freq: Three times a day (TID) | ORAL | 0 refills | Status: DC | PRN

## 2019-11-22 MED ORDER — OXYCODONE HCL 10 MG PO TABS: 10.0000 mg | ORAL_TABLET | Freq: Four times a day (QID) | ORAL | 0 refills | Status: DC | PRN

## 2019-11-22 MED ORDER — MAGNESIUM OXIDE -MG SUPPLEMENT 500 MG PO TABS: 500.0000 mg | ORAL_TABLET | Freq: Every day | ORAL | 0 refills | Status: DC

## 2019-11-22 MED ORDER — GABAPENTIN 800 MG PO TABS: 1600.0000 mg | ORAL_TABLET | Freq: Two times a day (BID) | ORAL | 0 refills | Status: DC

## 2019-11-22 NOTE — Progress Notes (Signed)
Nursing Pain Medication Assessment:  Safety precautions to be maintained throughout the outpatient stay will include: orient to surroundings, keep bed in low position, maintain call bell within reach at all times, provide assistance with transfer out of bed and ambulation.  Medication Inspection Compliance: Pill count conducted under aseptic conditions, in front of the patient. Neither the pills nor the bottle was removed from the patient's sight at any time. Once count was completed pills were immediately returned to the patient in their original bottle.  Medication: Oxycodone IR Pill/Patch Count: 71 of 120 pills remain Pill/Patch Appearance: Markings consistent with prescribed medication Bottle Appearance: Standard pharmacy container. Clearly labeled. Filled Date: 37 / 46 / 21 Last Medication intake:  TodaySafety precautions to be maintained throughout the outpatient stay will include: orient to surroundings, keep bed in low position, maintain call bell within reach at all times, provide assistance with transfer out of bed and ambulation.

## 2019-11-22 NOTE — Patient Instructions (Addendum)
____________________________________________________________________________________________  Preparing for Procedure with Sedation  Procedure appointments are limited to planned procedures: . No Prescription Refills. . No disability issues will be discussed. . No medication changes will be discussed.  Instructions: . Oral Intake: Do not eat or drink anything for at least 8 hours prior to your procedure. (Exception: Blood Pressure Medication. See below.) . Transportation: Unless otherwise stated by your physician, you may drive yourself after the procedure. . Blood Pressure Medicine: Do not forget to take your blood pressure medicine with a sip of water the morning of the procedure. If your Diastolic (lower reading)is above 100 mmHg, elective cases will be cancelled/rescheduled. . Blood thinners: These will need to be stopped for procedures. Notify our staff if you are taking any blood thinners. Depending on which one you take, there will be specific instructions on how and when to stop it. . Diabetics on insulin: Notify the staff so that you can be scheduled 1st case in the morning. If your diabetes requires high dose insulin, take only  of your normal insulin dose the morning of the procedure and notify the staff that you have done so. . Preventing infections: Shower with an antibacterial soap the morning of your procedure. . Build-up your immune system: Take 1000 mg of Vitamin C with every meal (3 times a day) the day prior to your procedure. Marland Kitchen Antibiotics: Inform the staff if you have a condition or reason that requires you to take antibiotics before dental procedures. . Pregnancy: If you are pregnant, call and cancel the procedure. . Sickness: If you have a cold, fever, or any active infections, call and cancel the procedure. . Arrival: You must be in the facility at least 30 minutes prior to your scheduled procedure. . Children: Do not bring children with you. . Dress appropriately:  Bring dark clothing that you would not mind if they get stained. . Valuables: Do not bring any jewelry or valuables.  Reasons to call and reschedule or cancel your procedure: (Following these recommendations will minimize the risk of a serious complication.) . Surgeries: Avoid having procedures within 2 weeks of any surgery. (Avoid for 2 weeks before or after any surgery). . Flu Shots: Avoid having procedures within 2 weeks of a flu shots or . (Avoid for 2 weeks before or after immunizations). . Barium: Avoid having a procedure within 7-10 days after having had a radiological study involving the use of radiological contrast. (Myelograms, Barium swallow or enema study). . Heart attacks: Avoid any elective procedures or surgeries for the initial 6 months after a "Myocardial Infarction" (Heart Attack). . Blood thinners: It is imperative that you stop these medications before procedures. Let us know if you if you take any blood thinner.  . Infection: Avoid procedures during or within two weeks of an infection (including chest colds or gastrointestinal problems). Symptoms associated with infections include: Localized redness, fever, chills, night sweats or profuse sweating, burning sensation when voiding, cough, congestion, stuffiness, runny nose, sore throat, diarrhea, nausea, vomiting, cold or Flu symptoms, recent or current infections. It is specially important if the infection is over the area that we intend to treat. Marland Kitchen Heart and lung problems: Symptoms that may suggest an active cardiopulmonary problem include: cough, chest pain, breathing difficulties or shortness of breath, dizziness, ankle swelling, uncontrolled high or unusually low blood pressure, and/or palpitations. If you are experiencing any of these symptoms, cancel your procedure and contact your primary care physician for an evaluation.  Remember:  Regular Business hours are:  Monday to Thursday 8:00 AM to 4:00 PM  Provider's  Schedule: Milinda Pointer, MD:  Procedure days: Tuesday and Thursday 7:30 AM to 4:00 PM  Gillis Santa, MD:  Procedure days: Monday and Wednesday 7:30 AM to 4:00 PM ____________________________________________________________________________________________   ____________________________________________________________________________________________  Blood Thinners  IMPORTANT NOTICE:  If you take any of these, make sure to notify the nursing staff.  Failure to do so may result in injury.  Recommended time intervals to stop and restart blood-thinners, before & after invasive procedures  Generic Name Brand Name Stop Time. Must be stopped at least this long before procedures. After procedures, wait at least this long before re-starting.  Abciximab Reopro 15 days 2 hrs  Alteplase Activase 10 days 10 days  Anagrelide Agrylin    Apixaban Eliquis 3 days 6 hrs  Cilostazol Pletal 3 days 5 hrs  Clopidogrel Plavix 7-10 days 2 hrs  Dabigatran Pradaxa 5 days 6 hrs  Dalteparin Fragmin 24 hours 4 hrs  Dipyridamole Aggrenox 11days 2 hrs  Edoxaban Lixiana; Savaysa 3 days 2 hrs  Enoxaparin  Lovenox 24 hours 4 hrs  Eptifibatide Integrillin 8 hours 2 hrs  Fondaparinux  Arixtra 72 hours 12 hrs  Prasugrel Effient 7-10 days 6 hrs  Reteplase Retavase 10 days 10 days  Rivaroxaban Xarelto 3 days 6 hrs  Ticagrelor Brilinta 5-7 days 6 hrs  Ticlopidine Ticlid 10-14 days 2 hrs  Tinzaparin Innohep 24 hours 4 hrs  Tirofiban Aggrastat 8 hours 2 hrs  Warfarin Coumadin 5 days 2 hrs   Other medications with blood-thinning effects  Product indications Generic (Brand) names Note  Cholesterol Lipitor Stop 4 days before procedure  Blood thinner (injectable) Heparin (LMW or LMWH Heparin) Stop 24 hours before procedure  Cancer Ibrutinib (Imbruvica) Stop 7 days before procedure  Malaria/Rheumatoid Hydroxychloroquine (Plaquenil) Stop 11 days before procedure  Thrombolytics  10 days before or after procedures    Over-the-counter (OTC) Products with blood-thinning effects  Product Common names Stop Time  Aspirin > 325 mg Goody Powders, Excedrin, etc. 11 days  Aspirin ? 81 mg  7 days  Fish oil  4 days  Garlic supplements  7 days  Ginkgo biloba  36 hours  Ginseng  24 hours  NSAIDs Ibuprofen, Naprosyn, etc. 3 days  Vitamin E  4 days   ____________________________________________________________________________________________  ____________________________________________________________________________________________  General Risks and Possible Complications  Patient Responsibilities: It is important that you read this as it is part of your informed consent. It is our duty to inform you of the risks and possible complications associated with treatments offered to you. It is your responsibility as a patient to read this and to ask questions about anything that is not clear or that you believe was not covered in this document.  Patient's Rights: You have the right to refuse treatment. You also have the right to change your mind, even after initially having agreed to have the treatment done. However, under this last option, if you wait until the last second to change your mind, you may be charged for the materials used up to that point.  Introduction: Medicine is not an Chief Strategy Officer. Everything in Medicine, including the lack of treatment(s), carries the potential for danger, harm, or loss (which is by definition: Risk). In Medicine, a complication is a secondary problem, condition, or disease that can aggravate an already existing one. All treatments carry the risk of possible complications. The fact that a side effects or complications occurs, does not imply that the treatment was conducted  incorrectly. It must be clearly understood that these can happen even when everything is done following the highest safety standards.  No treatment: You can choose not to proceed with the proposed treatment  alternative. The "PRO(s)" would include: avoiding the risk of complications associated with the therapy. The "CON(s)" would include: not getting any of the treatment benefits. These benefits fall under one of three categories: diagnostic; therapeutic; and/or palliative. Diagnostic benefits include: getting information which can ultimately lead to improvement of the disease or symptom(s). Therapeutic benefits are those associated with the successful treatment of the disease. Finally, palliative benefits are those related to the decrease of the primary symptoms, without necessarily curing the condition (example: decreasing the pain from a flare-up of a chronic condition, such as incurable terminal cancer).  General Risks and Complications: These are associated to most interventional treatments. They can occur alone, or in combination. They fall under one of the following six (6) categories: no benefit or worsening of symptoms; bleeding; infection; nerve damage; allergic reactions; and/or death. 1. No benefits or worsening of symptoms: In Medicine there are no guarantees, only probabilities. No healthcare provider can ever guarantee that a medical treatment will work, they can only state the probability that it may. Furthermore, there is always the possibility that the condition may worsen, either directly, or indirectly, as a consequence of the treatment. 2. Bleeding: This is more common if the patient is taking a blood thinner, either prescription or over the counter (example: Goody Powders, Fish oil, Aspirin, Garlic, etc.), or if suffering a condition associated with impaired coagulation (example: Hemophilia, cirrhosis of the liver, low platelet counts, etc.). However, even if you do not have one on these, it can still happen. If you have any of these conditions, or take one of these drugs, make sure to notify your treating physician. 3. Infection: This is more common in patients with a compromised immune  system, either due to disease (example: diabetes, cancer, human immunodeficiency virus [HIV], etc.), or due to medications or treatments (example: therapies used to treat cancer and rheumatological diseases). However, even if you do not have one on these, it can still happen. If you have any of these conditions, or take one of these drugs, make sure to notify your treating physician. 4. Nerve Damage: This is more common when the treatment is an invasive one, but it can also happen with the use of medications, such as those used in the treatment of cancer. The damage can occur to small secondary nerves, or to large primary ones, such as those in the spinal cord and brain. This damage may be temporary or permanent and it may lead to impairments that can range from temporary numbness to permanent paralysis and/or brain death. 5. Allergic Reactions: Any time a substance or material comes in contact with our body, there is the possibility of an allergic reaction. These can range from a mild skin rash (contact dermatitis) to a severe systemic reaction (anaphylactic reaction), which can result in death. 6. Death: In general, any medical intervention can result in death, most of the time due to an unforeseen complication. ____________________________________________________________________________________________

## 2019-12-01 NOTE — Patient Instructions (Addendum)
___________________________________________________________________________________________  Post-Radiofrequency (RF) Discharge Instructions  You have just completed a Radiofrequency Neurotomy.  The following instructions will provide you with information and guidelines for self-care upon discharge.  If at any time you have questions or concerns please call your physician. DO NOT DRIVE YOURSELF!!  Instructions:  Apply ice: Fill a plastic sandwich bag with crushed ice. Cover it with a small towel and apply to injection site. Apply for 15 minutes then remove x 15 minutes. Repeat sequence on day of procedure, until you go to bed. The purpose is to minimize swelling and discomfort after procedure.  Apply heat: Apply heat to procedure site starting the day following the procedure. The purpose is to treat any soreness and discomfort from the procedure.  Food intake: No eating limitations, unless stipulated above.  Nevertheless, if you have had sedation, you may experience some nausea.  In this case, it may be wise to wait at least two hours prior to resuming regular diet.  Physical activities: Keep activities to a minimum for the first 8 hours after the procedure. For the first 24 hours after the procedure, do not drive a motor vehicle,  Operate heavy machinery, power tools, or handle any weapons.  Consider walking with the use of an assistive device or accompanied by an adult for the first 24 hours.  Do not drink alcoholic beverages including beer.  Do not make any important decisions or sign any legal documents. Go home and rest today.  Resume activities tomorrow, as tolerated.  Use caution in moving about as you may experience mild leg weakness.  Use caution in cooking, use of household electrical appliances and climbing steps.  Driving: If you have received any sedation, you are not allowed to drive for 24 hours after your procedure.  Blood thinner: Restart your blood thinner 6 hours after your  procedure. (Only for those taking blood thinners)  Insulin: As soon as you can eat, you may resume your normal dosing schedule. (Only for those taking insulin)  Medications: May resume pre-procedure medications.  Do not take any drugs, other than what has been prescribed to you.  Infection prevention: Keep procedure site clean and dry.  Post-procedure Pain Diary: Extremely important that this be done correctly and accurately. Recorded information will be used to determine the next step in treatment.  Pain evaluated is that of treated area only. Do not include pain from an untreated area.  Complete every hour, on the hour, for the initial 8 hours. Set an alarm to help you do this part accurately.  Do not go to sleep and have it completed later. It will not be accurate.  Follow-up appointment: Keep your follow-up appointment after the procedure. Usually 2-6 weeks after radiofrequency. Bring you pain diary. The information collected will be essential for your long-term care.   Expect:  From numbing medicine (AKA: Local Anesthetics): Numbness or decrease in pain.  Onset: Full effect within 15 minutes of injected.  Duration: It will depend on the type of local anesthetic used. On the average, 1 to 8 hours.   From steroids (when added): Decrease in swelling or inflammation. Once inflammation is improved, relief of the pain will follow.  Onset of benefits: Depends on the amount of swelling present. The more swelling, the longer it will take for the benefits to be seen. In some cases, up to 10 days.  Duration: Steroids will stay in the system x 2 weeks. Duration of benefits will depend on multiple posibilities including persistent irritating factors.  From procedure: Some discomfort is to be expected once the numbing medicine wears off. In the case of radiofrequency procedures, this may last as long as 6 weeks. Additional post-procedure pain medication is provided for this. Discomfort is  minimized if ice and heat are applied as instructed.  Call if:  You experience numbness and weakness that gets worse with time, as opposed to wearing off.  He experience any unusual bleeding, difficulty breathing, or loss of the ability to control your bowel and bladder. (This applies to Spinal procedures only)  You experience any redness, swelling, heat, red streaks, elevated temperature, fever, or any other signs of a possible infection.  Emergency Numbers:  [place] hours (Monday - Thursday, 8:00 AM - 4:00 PM) (Friday, 9:00 AM - 12:00 Noon): [phone]  After hours: [phone] ____________________________________________________________________________________________   ____________________________________________________________________________________________  Preparing for Procedure with Sedation  Procedure appointments are limited to planned procedures: . No Prescription Refills. . No disability issues will be discussed. . No medication changes will be discussed.  Instructions: . Oral Intake: Do not eat or drink anything for at least 8 hours prior to your procedure. (Exception: Blood Pressure Medication. See below.) . Transportation: Unless otherwise stated by your physician, you may drive yourself after the procedure. . Blood Pressure Medicine: Do not forget to take your blood pressure medicine with a sip of water the morning of the procedure. If your Diastolic (lower reading)is above 100 mmHg, elective cases will be cancelled/rescheduled. . Blood thinners: These will need to be stopped for procedures. Notify our staff if you are taking any blood thinners. Depending on which one you take, there will be specific instructions on how and when to stop it. . Diabetics on insulin: Notify the staff so that you can be scheduled 1st case in the morning. If your diabetes requires high dose insulin, take only  of your normal insulin dose the morning of the procedure and  notify the staff that you have done so. . Preventing infections: Shower with an antibacterial soap the morning of your procedure. . Build-up your immune system: Take 1000 mg of Vitamin C with every meal (3 times a day) the day prior to your procedure. Marland Kitchen Antibiotics: Inform the staff if you have a condition or reason that requires you to take antibiotics before dental procedures. . Pregnancy: If you are pregnant, call and cancel the procedure. . Sickness: If you have a cold, fever, or any active infections, call and cancel the procedure. . Arrival: You must be in the facility at least 30 minutes prior to your scheduled procedure. . Children: Do not bring children with you. . Dress appropriately: Bring dark clothing that you would not mind if they get stained. . Valuables: Do not bring any jewelry or valuables.  Reasons to call and reschedule or cancel your procedure: (Following these recommendations will minimize the risk of a serious complication.) . Surgeries: Avoid having procedures within 2 weeks of any surgery. (Avoid for 2 weeks before or after any surgery). . Flu Shots: Avoid having procedures within 2 weeks of a flu shots or . (Avoid for 2 weeks before or after immunizations). . Barium: Avoid having a procedure within 7-10 days after having had a radiological study involving the use of radiological contrast. (Myelograms, Barium swallow or enema study). . Heart attacks: Avoid any elective procedures or surgeries for the initial 6 months after a "Myocardial Infarction" (Heart Attack). . Blood thinners: It is imperative that you stop these medications before procedures. Let  us know if you if you take any blood thinner.  . Infection: Avoid procedures during or within two weeks of an infection (including chest colds or gastrointestinal problems). Symptoms associated with infections include: Localized redness, fever, chills, night sweats or profuse sweating, burning sensation when voiding, cough,  congestion, stuffiness, runny nose, sore throat, diarrhea, nausea, vomiting, cold or Flu symptoms, recent or current infections. It is specially important if the infection is over the area that we intend to treat. Marland Kitchen Heart and lung problems: Symptoms that may suggest an active cardiopulmonary problem include: cough, chest pain, breathing difficulties or shortness of breath, dizziness, ankle swelling, uncontrolled high or unusually low blood pressure, and/or palpitations. If you are experiencing any of these symptoms, cancel your procedure and contact your primary care physician for an evaluation.  Remember:  Regular Business hours are:  Monday to Thursday 8:00 AM to 4:00 PM  Provider's Schedule: Milinda Pointer, MD:  Procedure days: Tuesday and Thursday 7:30 AM to 4:00 PM  Gillis Santa, MD:  Procedure days: Monday and Wednesday 7:30 AM to 4:00 PM ____________________________________________________________________________________________

## 2019-12-01 NOTE — Progress Notes (Signed)
PROVIDER NOTE: Information contained herein reflects review and annotations entered in association with encounter. Interpretation of such information and data should be left to medically-trained personnel. Information provided to patient can be located elsewhere in the medical record under "Patient Instructions". Document created using STT-dictation technology, any transcriptional errors that may result from process are unintentional.    Patient: Kenneth Osborn  Service Category: Procedure  Provider: Gaspar Cola, MD  DOB: 10-06-1955  DOS: 12/02/2019  Location: [facility]  MRN: [id number]  Setting: Ambulatory - outpatient  Referring Provider: [name], MD  Type: Established Patient  Specialty: Interventional Pain Management  PCP: [name], MD   Primary Reason for Visit: Interventional Pain Management Treatment. CC: Back Pain (right, lower)  Procedure:          Anesthesia, Analgesia, Anxiolysis:  Type: Thermal Lumbar Facet, Medial Branch Radiofrequency Ablation/Neurotomy  #2  Primary Purpose: Therapeutic Region: Posterolateral Lumbosacral Spine Level: L2, L3, L4, L5, & S1 Medial Branch Level(s). These levels will denervate the L3-4, L4-5, and the L5-S1 lumbar facet joints. Laterality: Right  Type: Local Anesthesia Indication(s): Analgesia         Route: Infiltration ([place]/IM) IV Access: Declined Sedation: Declined  Local Anesthetic: Lidocaine 1-2%  Position: Prone   Indications: 1. Lumbar facet syndrome (Bilateral) (R>L)   2. Spondylosis without myelopathy or radiculopathy, lumbosacral region   3. DDD (degenerative disc disease), lumbosacral   4. Chronic low back pain (Primary Area of Pain) (Bilateral) (L>R)   5. Failed back surgical syndrome   6. Chronic anticoagulation (Effient)    Kenneth Osborn has been dealing with the above chronic pain for longer than three months and has either failed to respond, was unable to tolerate, or simply did not get enough  benefit from other more conservative therapies including, but not limited to: 1. Over-the-counter medications 2. Anti-inflammatory medications 3. Muscle relaxants 4. Membrane stabilizers 5. Opioids 6. Physical therapy and/or chiropractic manipulation 7. Modalities (Heat, ice, etc.) 8. Invasive techniques such as nerve blocks. Kenneth Osborn has attained more than 50% relief of the pain from a series of diagnostic injections conducted in separate occasions.  Pain Score: Pre-procedure: 3 /10 Post-procedure: 0-No pain/10  Pre-op Assessment:  Kenneth Osborn is a 64 y.o. (year old), male patient, seen today for interventional treatment. He  has a past surgical history that includes Cardiovascular stress test; Cardiac catheterization; Coronary angioplasty; Labrinthectomy; mastoid shunt; Appendectomy; Back surgery; Shoulder arthroscopy with subacromial decompression (Left, 04/06/2012); ARTHRODESIS ANTERIOR ANTERIOR CERVICLE SPINE (01/04/2013); Colonoscopy with propofol (N/A, 09/19/2014); Esophagogastroduodenoscopy (N/A, 09/19/2014); Savory dilation (N/A, 09/19/2014); Spinal cord stimulator implant (Right); Cardiac catheterization (N/A, 06/15/2015); Cardiac catheterization (N/A, 06/15/2015); Pulse generator implant (N/A, 01/18/2019); and Lumbar spinal cord simulator lead removal (Right, 08/09/2019). Kenneth Osborn has a current medication list which includes the following prescription(s): alprazolam, cetirizine, [START ON 12/21/2019] cyclobenzaprine, diazepam, diltiazem, esomeprazole, ezetimibe, [START ON 12/21/2019] gabapentin, glucagon, humulin r u-500 kwikpen, hydrocodone-acetaminophen, [START ON 12/09/2019] hydrocodone-acetaminophen, isosorbide mononitrate, [START ON 12/21/2019] magnesium oxide, meclizine, metformin, metoprolol succinate, montelukast, naloxone, naloxone, niacin, nitroglycerin, fish oil, oxycodone hcl, [START ON 12/22/2019] oxycodone hcl, [START ON 01/21/2020] oxycodone hcl, rosuvastatin, enalapril, and magnesium. His  primarily concern today is the Back Pain (right, lower)  Initial Vital Signs:  Pulse/HCG Rate: 86  Temp: (!) 97.3 F (36.3 C) Resp: 18 BP: 128/83 SpO2: 96 %  BMI: Estimated body mass index is 33.97 kg/m as calculated from the following:   Height as of this encounter: 5' 9"  (1.753 m).  Weight as of this encounter: 230 lb (104.3 kg).  Risk Assessment: Allergies: Reviewed. He has No Known Allergies.  Allergy Precautions: None required Coagulopathies: Reviewed. None identified.  Blood-thinner therapy: None at this time Active Infection(s): Reviewed. None identified. Kenneth Osborn is afebrile  Site Confirmation: Kenneth Osborn was asked to confirm the procedure and laterality before marking the site Procedure checklist: Completed Consent: Before the procedure and under the influence of no sedative(s), amnesic(s), or anxiolytics, the patient was informed of the treatment options, risks and possible complications. To fulfill our ethical and legal obligations, as recommended by the American Medical Association's Code of Ethics, I have informed the patient of my clinical impression; the nature and purpose of the treatment or procedure; the risks, benefits, and possible complications of the intervention; the alternatives, including doing nothing; the risk(s) and benefit(s) of the alternative treatment(s) or procedure(s); and the risk(s) and benefit(s) of doing nothing. The patient was provided information about the general risks and possible complications associated with the procedure. These may include, but are not limited to: failure to achieve desired goals, infection, bleeding, organ or nerve damage, allergic reactions, paralysis, and death. In addition, the patient was informed of those risks and complications associated to Spine-related procedures, such as failure to decrease pain; infection (i.e.: Meningitis, epidural or intraspinal abscess); bleeding (i.e.: epidural hematoma, subarachnoid hemorrhage,  or any other type of intraspinal or peri-dural bleeding); organ or nerve damage (i.e.: Any type of peripheral nerve, nerve root, or spinal cord injury) with subsequent damage to sensory, motor, and/or autonomic systems, resulting in permanent pain, numbness, and/or weakness of one or several areas of the body; allergic reactions; (i.e.: anaphylactic reaction); and/or death. Furthermore, the patient was informed of those risks and complications associated with the medications. These include, but are not limited to: allergic reactions (i.e.: anaphylactic or anaphylactoid reaction(s)); adrenal axis suppression; blood sugar elevation that in diabetics may result in ketoacidosis or comma; water retention that in patients with history of congestive heart failure may result in shortness of breath, pulmonary edema, and decompensation with resultant heart failure; weight gain; swelling or edema; medication-induced neural toxicity; particulate matter embolism and blood vessel occlusion with resultant organ, and/or nervous system infarction; and/or aseptic necrosis of one or more joints. Finally, the patient was informed that Medicine is not an exact science; therefore, there is also the possibility of unforeseen or unpredictable risks and/or possible complications that may result in a catastrophic outcome. The patient indicated having understood very clearly. We have given the patient no guarantees and we have made no promises. Enough time was given to the patient to ask questions, all of which were answered to the patient's satisfaction. Mr. Hissong has indicated that he wanted to continue with the procedure. Attestation: I, the ordering provider, attest that I have discussed with the patient the benefits, risks, side-effects, alternatives, likelihood of achieving goals, and potential problems during recovery for the procedure that I have provided informed consent. Date  Time: 12/02/2019 10:19 AM  Pre-Procedure  Preparation:  Monitoring: As per clinic protocol. Respiration, ETCO2, SpO2, BP, heart rate and rhythm monitor placed and checked for adequate function Safety Precautions: Patient was assessed for positional comfort and pressure points before starting the procedure. Time-out: I initiated and conducted the "Time-out" before starting the procedure, as per protocol. The patient was asked to participate by confirming the accuracy of the "Time Out" information. Verification of the correct person, site, and procedure were performed and confirmed by me, the nursing staff, and the  patient. "Time-out" conducted as per Joint Commission's Universal Protocol (UP.01.01.01). Time: 1122  Description of Procedure:          Laterality: Right Levels:  L2, L3, L4, L5, & S1 Medial Branch Level(s), at the L3-4, L4-5, and the L5-S1 lumbar facet joints. Area Prepped: Lumbosacral DuraPrep (Iodine Povacrylex [0.7% available iodine] and Isopropyl Alcohol, 74% w/w) Safety Precautions: Aspiration looking for blood return was conducted prior to all injections. At no point did we inject any substances, as a needle was being advanced. Before injecting, the patient was told to immediately notify me if he was experiencing any new onset of "ringing in the ears, or metallic taste in the mouth". No attempts were made at seeking any paresthesias. Safe injection practices and needle disposal techniques used. Medications properly checked for expiration dates. SDV (single dose vial) medications used. After the completion of the procedure, all disposable equipment used was discarded in the proper designated medical waste containers. Local Anesthesia: Protocol guidelines were followed. The patient was positioned over the fluoroscopy table. The area was prepped in the usual manner. The time-out was completed. The target area was identified using fluoroscopy. A 12-in long, straight, sterile hemostat was used with fluoroscopic guidance to locate the  targets for each level blocked. Once located, the skin was marked with an approved surgical skin marker. Once all sites were marked, the skin (epidermis, dermis, and hypodermis), as well as deeper tissues (fat, connective tissue and muscle) were infiltrated with a small amount of a short-acting local anesthetic, loaded on a 10cc syringe with a 25G, 1.5-in  Needle. An appropriate amount of time was allowed for local anesthetics to take effect before proceeding to the next step. Local Anesthetic: Lidocaine 2.0% The unused portion of the local anesthetic was discarded in the proper designated containers. Technical explanation of process:  Radiofrequency Ablation (RFA) L2 Medial Branch Nerve RFA: The target area for the L2 medial branch is at the junction of the postero-lateral aspect of the superior articular process and the superior, posterior, and medial edge of the transverse process of L3. Under fluoroscopic guidance, a Radiofrequency needle was inserted until contact was made with os over the superior postero-lateral aspect of the pedicular shadow (target area). Sensory and motor testing was conducted to properly adjust the position of the needle. Once satisfactory placement of the needle was achieved, the numbing solution was slowly injected after negative aspiration for blood. 2.0 mL of the nerve block solution was injected without difficulty or complication. After waiting for at least 3 minutes, the ablation was performed. Once completed, the needle was removed intact. L3 Medial Branch Nerve RFA: The target area for the L3 medial branch is at the junction of the postero-lateral aspect of the superior articular process and the superior, posterior, and medial edge of the transverse process of L4. Under fluoroscopic guidance, a Radiofrequency needle was inserted until contact was made with os over the superior postero-lateral aspect of the pedicular shadow (target area). Sensory and motor testing was  conducted to properly adjust the position of the needle. Once satisfactory placement of the needle was achieved, the numbing solution was slowly injected after negative aspiration for blood. 2.0 mL of the nerve block solution was injected without difficulty or complication. After waiting for at least 3 minutes, the ablation was performed. Once completed, the needle was removed intact. L4 Medial Branch Nerve RFA: The target area for the L4 medial branch is at the junction of the postero-lateral aspect of the superior articular process  and the superior, posterior, and medial edge of the transverse process of L5. Under fluoroscopic guidance, a Radiofrequency needle was inserted until contact was made with os over the superior postero-lateral aspect of the pedicular shadow (target area). Sensory and motor testing was conducted to properly adjust the position of the needle. Once satisfactory placement of the needle was achieved, the numbing solution was slowly injected after negative aspiration for blood. 2.0 mL of the nerve block solution was injected without difficulty or complication. After waiting for at least 3 minutes, the ablation was performed. Once completed, the needle was removed intact. L5 Medial Branch Nerve RFA: The target area for the L5 medial branch is at the junction of the postero-lateral aspect of the superior articular process of S1 and the superior, posterior, and medial edge of the sacral ala. Under fluoroscopic guidance, a Radiofrequency needle was inserted until contact was made with os over the superior postero-lateral aspect of the pedicular shadow (target area). Sensory and motor testing was conducted to properly adjust the position of the needle. Once satisfactory placement of the needle was achieved, the numbing solution was slowly injected after negative aspiration for blood. 2.0 mL of the nerve block solution was injected without difficulty or complication. After waiting for at least 3  minutes, the ablation was performed. Once completed, the needle was removed intact. S1 Medial Branch Nerve RFA: The target area for the S1 medial branch is located inferior to the junction of the S1 superior articular process and the L5 inferior articular process, posterior, inferior, and lateral to the 6 o'clock position of the L5-S1 facet joint, just superior to the S1 posterior foramen. Under fluoroscopic guidance, the Radiofrequency needle was advanced until contact was made with os over the Target area. Sensory and motor testing was conducted to properly adjust the position of the needle. Once satisfactory placement of the needle was achieved, the numbing solution was slowly injected after negative aspiration for blood. 2.0 mL of the nerve block solution was injected without difficulty or complication. After waiting for at least 3 minutes, the ablation was performed. Once completed, the needle was removed intact. Radiofrequency lesioning (ablation):  Radiofrequency Generator: NeuroTherm [id number] Sensory Stimulation Parameters: 50 Hz was used to locate & identify the nerve, making sure that the needle was positioned such that there was no sensory stimulation below 0.3 V or above 0.7 V. Motor Stimulation Parameters: 2 Hz was used to evaluate the motor component. Care was taken not to lesion any nerves that demonstrated motor stimulation of the lower extremities at an output of less than 2.5 times that of the sensory threshold, or a maximum of 2.0 V. Lesioning Technique Parameters: Standard Radiofrequency settings. (Not bipolar or pulsed.) Temperature Settings: 80 degrees C Lesioning time: 60 seconds Intra-operative Compliance: Compliant Materials & Medications: Needle(s) (Electrode/Cannula) Type: Teflon-coated, curved tip, Radiofrequency needle(s) Gauge: 22G Length: 10cm Numbing solution: 0.2% PF-Ropivacaine + Triamcinolone (40 mg/mL) diluted to a final concentration of 4 mg of Triamcinolone/mL of  Ropivacaine The unused portion of the solution was discarded in the proper designated containers.  Once the entire procedure was completed, the treated area was cleaned, making sure to leave some of the prepping solution back to take advantage of its long term bactericidal properties.  Illustration of the posterior view of the lumbar spine and the posterior neural structures. Laminae of L2 through S1 are labeled. DPRL5, dorsal primary ramus of L5; DPRS1, dorsal primary ramus of S1; DPR3, dorsal primary ramus of L3; FJ, facet (zygapophyseal) joint  L3-L4; I, inferior articular process of L4; LB1, lateral branch of dorsal primary ramus of L1; IAB, inferior articular branches from L3 medial branch (supplies L4-L5 facet joint); IBP, intermediate branch plexus; MB3, medial branch of dorsal primary ramus of L3; NR3, third lumbar nerve root; S, superior articular process of L5; SAB, superior articular branches from L4 (supplies L4-5 facet joint also); TP3, transverse process of L3.  Vitals:   12/02/19 1128 12/02/19 1138 12/02/19 1148 12/02/19 1158  BP: 120/68 121/69 115/64 124/65  Pulse: 79 77 78 78  Resp: 15 17 16 16   Temp:      TempSrc:      SpO2: 93% 93% 92% 91%  Weight:      Height:       Start Time: 1122 hrs. End Time: 1153 hrs.  Imaging Guidance (Spinal):          Type of Imaging Technique: Fluoroscopy Guidance (Spinal) Indication(s): Assistance in needle guidance and placement for procedures requiring needle placement in or near specific anatomical locations not easily accessible without such assistance. Exposure Time: Please see nurses notes. Contrast: None used. Fluoroscopic Guidance: I was personally present during the use of fluoroscopy. "Tunnel Vision Technique" used to obtain the best possible view of the target area. Parallax error corrected before commencing the procedure. "Direction-depth-direction" technique used to introduce the needle under continuous pulsed fluoroscopy. Once  target was reached, antero-posterior, oblique, and lateral fluoroscopic projection used confirm needle placement in all planes. Images permanently stored in EMR. Interpretation: No contrast injected. I personally interpreted the imaging intraoperatively. Adequate needle placement confirmed in multiple planes. Permanent images saved into the patient's record.  Antibiotic Prophylaxis:   Anti-infectives (From admission, onward)   None     Indication(s): None identified  Post-operative Assessment:  Post-procedure Vital Signs:  Pulse/HCG Rate: 78 (nsr)  Temp: (!) 97.3 F (36.3 C) Resp: 16 BP: 124/65 SpO2: 91 %  EBL: None  Complications: No immediate post-treatment complications observed by team, or reported by patient.  Note: The patient tolerated the entire procedure well. A repeat set of vitals were taken after the procedure and the patient was kept under observation following institutional policy, for this type of procedure. Post-procedural neurological assessment was performed, showing return to baseline, prior to discharge. The patient was provided with post-procedure discharge instructions, including a section on how to identify potential problems. Should any problems arise concerning this procedure, the patient was given instructions to immediately contact us, at any time, without hesitation. In any case, we plan to contact the patient by telephone for a follow-up status report regarding this interventional procedure.  Comments:  No additional relevant information.  Plan of Care  Orders:  Orders Placed This Encounter  Procedures  . Radiofrequency,Lumbar    Scheduling Instructions:     Side(s): Right-sided     Level: L3-4, L4-5, & L5-S1 Facets (L2, L3, L4, L5, & S1 Medial Branch Nerves)     Sedation: Patient's choice.     Timeframe: Today    Order Specific Question:   Where will this procedure be performed?    Answer:   [facility]  . Radiofrequency,Lumbar     Standing Status:   Future    Standing Expiration Date:   12/01/2020    Scheduling Instructions:     Side(s): Left-sided     Level: L3-4, L4-5, & L5-S1 Facets (L2, L3, L4, L5, & S1 Medial Branch Nerves)     Sedation: Patient's choice.     Scheduling Timeframe: 2  weeks from now    Order Specific Question:   Where will this procedure be performed?    Answer:   [facility]  . DG PA[facility] C-ARM 1-60 MIN NO REPORT    Intraoperative interpretation by procedural physician at [facility].    Standing Status:   Standing    Number of Occurrences:   1    Order Specific Question:   Reason for exam:    Answer:   Assistance in needle guidance and placement for procedures requiring needle placement in or near specific anatomical locations not easily accessible without such assistance.  . Informed Consent Details: Physician/Practitioner Attestation; Transcribe to consent form and obtain patient signature    Nursing Order: Transcribe to consent form and obtain patient signature. Note: Always confirm laterality of pain with Mr. Featherston, before procedure.    Order Specific Question:   Physician/Practitioner attestation of informed consent for procedure/surgical case    Answer:   I, the physician/practitioner, attest that I have discussed with the patient the benefits, risks, side effects, alternatives, likelihood of achieving goals and potential problems during recovery for the procedure that I have provided informed consent.    Order Specific Question:   Procedure    Answer:   Lumbar Facet Radiofrequency Ablation    Order Specific Question:   Physician/Practitioner performing the procedure    Answer:   Milinda Pointer MD    Order Specific Question:   Indication/Reason    Answer:   Low Back Pain, with our without leg pain, due to Facet Joint Arthralgia (Joint Pain) known as Lumbar Facet Syndrome, secondary to Lumbar, and/or Lumbosacral Spondylosis (Arthritis of the Spine), without  myelopathy or radiculopathy (Nerve Damage).  . Care order/instruction: Please confirm that the patient has stopped the Effient (Prasugrel) x 10 days prior to procedure or surgery.    Please confirm that the patient has stopped the Effient (Prasugrel) x 10 days prior to procedure or surgery.    Standing Status:   Standing    Number of Occurrences:   1  . Provide equipment / supplies at bedside    "Radiofrequency Tray"; Large hemostat (x1); Small hemostat (x1); Towels (x8); 4x4 sterile sponge pack (x1) Needle type: Teflon-coated Radiofrequency Needle (Disposable  single use) Size: Long Quantity: 5    Standing Status:   Standing    Number of Occurrences:   1    Order Specific Question:   Specify    Answer:   Radiofrequency Tray  . Bleeding precautions    Standing Status:   Standing    Number of Occurrences:   1   Chronic Opioid Analgesic:  Oxycodone IR 10 mg, 1 tab PO q 6 hrs (40 mg/day of oxycodone) MME/day:60 mg/day.   Medications ordered for procedure: Meds ordered this encounter  Medications  . lidocaine (XYLOCAINE) 2 % (with pres) injection 400 mg  . DISCONTD: lactated ringers infusion 1,000 mL  . DISCONTD: midazolam (VERSED) 5 MG/5ML injection 1-2 mg    Make sure Flumazenil is available in the pyxis when using this medication. If oversedation occurs, administer 0.2 mg IV over 15 sec. If after 45 sec no response, administer 0.2 mg again over 1 min; may repeat at 1 min intervals; not to exceed 4 doses (1 mg)  . DISCONTD: fentaNYL (SUBLIMAZE) injection 25-50 mcg    Make sure Narcan is available in the pyxis when using this medication. In the event of respiratory depression (RR< 8/min): Titrate NARCAN (naloxone) in increments of 0.1 to  0.2 mg IV at 2-3 minute intervals, until desired degree of reversal.  . ropivacaine (PF) 2 mg/mL (0.2%) (NAROPIN) injection 9 mL  . triamcinolone acetonide (KENALOG-40) injection 40 mg  . HYDROcodone-acetaminophen (NORCO/VICODIN) 5-325 MG tablet     Sig: Take 1 tablet by mouth every 8 (eight) hours as needed for up to 7 days for severe pain. Must last 7 days.    Dispense:  21 tablet    Refill:  0    For acute post-operative pain. Not to be refilled. Must last 7 days.  Marland Kitchen HYDROcodone-acetaminophen (NORCO/VICODIN) 5-325 MG tablet    Sig: Take 1 tablet by mouth every 8 (eight) hours as needed for up to 7 days for severe pain. Must last for 7 days.    Dispense:  21 tablet    Refill:  0    For acute post-operative pain. Not to be refilled. Must last 7 days.   Medications administered: We administered lidocaine, ropivacaine (PF) 2 mg/mL (0.2%), and triamcinolone acetonide.  See the medical record for exact dosing, route, and time of administration.  Follow-up plan:   Return in about 2 weeks (around 12/16/2019) for Radio-Frequency: (L) L-FCT RFA #2.       Considering: NOTE: EFFIENT Anticoagulation (Stop: 7-10 days  Re-start: 6 hrs)    Palliative PRN treatment(s): Palliative right PSIS MNB/TPI  Diagnostic/palliative right lumbar facet block #3  Diagnostic/palliative left lumbar facet block #2  Palliative left lumbar facet RFA #2 (last done 12/03/2018)  Palliative right lumbar facet RFA #2 (last done 12/17/2018)     Recent Visits Date Type Provider Dept  11/22/19 Office Visit Milinda Pointer, MD [facility]  Showing recent visits within past 90 days and meeting all other requirements Today's Visits Date Type Provider Dept  12/02/19 Procedure visit Milinda Pointer, MD [facility]  Showing today's visits and meeting all other requirements Future Appointments Date Type Provider Dept  12/16/19 Appointment Milinda Pointer, MD [facility]  02/21/20 Appointment Milinda Pointer, MD [facility]  Showing future appointments within next 90 days and meeting all other requirements  Disposition: Discharge home  Discharge (Date  Time): 12/02/2019; 1200 hrs.   Primary Care Physician:  [name], MD Location: [facility] Note by: Gaspar Cola, MD Date: 12/02/2019; Time: 3:06 PM  Disclaimer:  Medicine is not an Chief Strategy Officer. The only guarantee in medicine is that nothing is guaranteed. It is important to note that the decision to proceed with this intervention was based on the information collected from the patient. The Data and conclusions were drawn from the patient's questionnaire, the interview, and the physical examination. Because the information was provided in large part by the patient, it cannot be guaranteed that it has not been purposely or unconsciously manipulated. Every effort has been made to obtain as much relevant data as possible for this evaluation. It is important to note that the conclusions that lead to this procedure are derived in large part from the available data. Always take into account that the treatment will also be dependent on availability of resources and existing treatment guidelines, considered by other Pain Management Practitioners as being common knowledge and practice, at the time of the intervention. For Medico-Legal purposes, it is also important to point out that variation in procedural techniques and pharmacological choices are the acceptable norm. The indications, contraindications, technique, and results of the above procedure should only be interpreted and judged by a Board-Certified Interventional Pain Specialist with extensive familiarity  and expertise in the same exact procedure and technique.

## 2019-12-02 ENCOUNTER — Encounter: Payer: Self-pay | Admitting: Pain Medicine

## 2019-12-02 ENCOUNTER — Other Ambulatory Visit: Payer: Self-pay

## 2019-12-02 ENCOUNTER — Ambulatory Visit
Admission: RE | Admit: 2019-12-02 | Discharge: 2019-12-02 | Disposition: A | Discharge status: Home / Self Care | Payer: Medicare Other | Source: Ambulatory Visit | Attending: Pain Medicine | Admitting: Pain Medicine

## 2019-12-02 ENCOUNTER — Ambulatory Visit (HOSPITAL_BASED_OUTPATIENT_CLINIC_OR_DEPARTMENT_OTHER): Payer: Medicare Other | Admitting: Pain Medicine

## 2019-12-02 VITALS — BP 124/65 | HR 78 | Temp 97.3°F | Resp 16 | Ht 69.0 in | Wt 230.0 lb

## 2019-12-02 DIAGNOSIS — M5137 Other intervertebral disc degeneration, lumbosacral region: Secondary | ICD-10-CM | POA: Diagnosis not present

## 2019-12-02 DIAGNOSIS — M47816 Spondylosis without myelopathy or radiculopathy, lumbar region: Secondary | ICD-10-CM

## 2019-12-02 DIAGNOSIS — G8918 Other acute postprocedural pain: Secondary | ICD-10-CM

## 2019-12-02 DIAGNOSIS — M47817 Spondylosis without myelopathy or radiculopathy, lumbosacral region: Secondary | ICD-10-CM | POA: Diagnosis not present

## 2019-12-02 DIAGNOSIS — M961 Postlaminectomy syndrome, not elsewhere classified: Secondary | ICD-10-CM

## 2019-12-02 DIAGNOSIS — Z7901 Long term (current) use of anticoagulants: Secondary | ICD-10-CM

## 2019-12-02 DIAGNOSIS — M5442 Lumbago with sciatica, left side: Secondary | ICD-10-CM

## 2019-12-02 DIAGNOSIS — M51379 Other intervertebral disc degeneration, lumbosacral region without mention of lumbar back pain or lower extremity pain: Secondary | ICD-10-CM

## 2019-12-02 DIAGNOSIS — G8929 Other chronic pain: Secondary | ICD-10-CM | POA: Diagnosis present

## 2019-12-02 MED ORDER — HYDROCODONE-ACETAMINOPHEN 5-325 MG PO TABS: 1.0000 | ORAL_TABLET | Freq: Three times a day (TID) | ORAL | 0 refills | Status: DC | PRN

## 2019-12-02 MED ORDER — FENTANYL CITRATE (PF) 100 MCG/2ML IJ SOLN
25.0000 ug | INTRAMUSCULAR | Status: DC | PRN
  Filled 2019-12-02: qty 2

## 2019-12-02 MED ORDER — ROPIVACAINE HCL 2 MG/ML IJ SOLN
9.0000 mL | Freq: Once | INTRAMUSCULAR | Status: AC
  Administered 2019-12-02: 9 mL via PERINEURAL
  Filled 2019-12-02: qty 10

## 2019-12-02 MED ORDER — HYDROCODONE-ACETAMINOPHEN 5-325 MG PO TABS: 1.0000 | ORAL_TABLET | Freq: Three times a day (TID) | ORAL | 0 refills | Status: AC | PRN

## 2019-12-02 MED ORDER — TRIAMCINOLONE ACETONIDE 40 MG/ML IJ SUSP
40.0000 mg | Freq: Once | INTRAMUSCULAR | Status: AC
  Administered 2019-12-02: 40 mg
  Filled 2019-12-02: qty 1

## 2019-12-02 MED ORDER — LIDOCAINE HCL 2 % IJ SOLN
20.0000 mL | Freq: Once | INTRAMUSCULAR | Status: AC
  Administered 2019-12-02: 400 mg
  Filled 2019-12-02: qty 10

## 2019-12-02 MED ORDER — MIDAZOLAM HCL 5 MG/5ML IJ SOLN
1.0000 mg | INTRAMUSCULAR | Status: DC | PRN
  Filled 2019-12-02: qty 5

## 2019-12-02 MED ORDER — LACTATED RINGERS IV SOLN: 1000.0000 mL | Freq: Once | INTRAVENOUS | Status: DC

## 2019-12-03 ENCOUNTER — Telehealth: Payer: Self-pay

## 2019-12-03 NOTE — Telephone Encounter (Signed)
States he had a rough night and slept on ice. His medication was sent to another [facility]. Patient is going to pick up today. Instructed to call if any problems.

## 2019-12-16 ENCOUNTER — Encounter: Payer: Medicare Other | Admitting: Pain Medicine

## 2019-12-16 DIAGNOSIS — G8929 Other chronic pain: Secondary | ICD-10-CM | POA: Insufficient documentation

## 2019-12-16 DIAGNOSIS — R739 Hyperglycemia, unspecified: Secondary | ICD-10-CM | POA: Insufficient documentation

## 2019-12-16 DIAGNOSIS — M545 Low back pain, unspecified: Secondary | ICD-10-CM | POA: Insufficient documentation

## 2019-12-16 NOTE — Progress Notes (Signed)
The patient called and reschedule the procedure.  He has insulin-dependent diabetes and apparently he is having problems with hypoglycemia this morning.  He indicated feeling really bad and we recommended that he call his PCP or go to an urgent care facility if he is not able to get it under control.

## 2020-01-04 ENCOUNTER — Encounter: Payer: Self-pay | Admitting: Pain Medicine

## 2020-01-04 ENCOUNTER — Ambulatory Visit
Admission: RE | Admit: 2020-01-04 | Discharge: 2020-01-04 | Disposition: A | Discharge status: Home / Self Care | Payer: Medicare Other | Source: Ambulatory Visit | Attending: Pain Medicine | Admitting: Pain Medicine

## 2020-01-04 ENCOUNTER — Other Ambulatory Visit: Payer: Self-pay

## 2020-01-04 ENCOUNTER — Ambulatory Visit (HOSPITAL_BASED_OUTPATIENT_CLINIC_OR_DEPARTMENT_OTHER): Payer: Medicare Other | Admitting: Pain Medicine

## 2020-01-04 VITALS — BP 100/78 | HR 94 | Temp 97.5°F | Resp 17 | Ht 69.0 in | Wt 225.0 lb

## 2020-01-04 DIAGNOSIS — G8918 Other acute postprocedural pain: Secondary | ICD-10-CM

## 2020-01-04 DIAGNOSIS — Z09 Encounter for follow-up examination after completed treatment for conditions other than malignant neoplasm: Secondary | ICD-10-CM | POA: Insufficient documentation

## 2020-01-04 DIAGNOSIS — M545 Low back pain, unspecified: Secondary | ICD-10-CM | POA: Diagnosis not present

## 2020-01-04 DIAGNOSIS — M47817 Spondylosis without myelopathy or radiculopathy, lumbosacral region: Secondary | ICD-10-CM

## 2020-01-04 DIAGNOSIS — Z7901 Long term (current) use of anticoagulants: Secondary | ICD-10-CM

## 2020-01-04 DIAGNOSIS — G8929 Other chronic pain: Secondary | ICD-10-CM | POA: Insufficient documentation

## 2020-01-04 DIAGNOSIS — M47816 Spondylosis without myelopathy or radiculopathy, lumbar region: Secondary | ICD-10-CM | POA: Insufficient documentation

## 2020-01-04 DIAGNOSIS — M5137 Other intervertebral disc degeneration, lumbosacral region: Secondary | ICD-10-CM | POA: Diagnosis present

## 2020-01-04 MED ORDER — LIDOCAINE HCL 2 % IJ SOLN
20.0000 mL | Freq: Once | INTRAMUSCULAR | Status: AC
  Administered 2020-01-04: 400 mg
  Filled 2020-01-04: qty 40

## 2020-01-04 MED ORDER — LACTATED RINGERS IV SOLN
1000.0000 mL | Freq: Once | INTRAVENOUS | Status: AC
  Administered 2020-01-04: 1000 mL via INTRAVENOUS

## 2020-01-04 MED ORDER — ROPIVACAINE HCL 2 MG/ML IJ SOLN
9.0000 mL | Freq: Once | INTRAMUSCULAR | Status: AC
  Administered 2020-01-04: 9 mL via PERINEURAL
  Filled 2020-01-04: qty 10

## 2020-01-04 MED ORDER — HYDROCODONE-ACETAMINOPHEN 5-325 MG PO TABS: 1.0000 | ORAL_TABLET | Freq: Three times a day (TID) | ORAL | 0 refills | Status: AC | PRN

## 2020-01-04 MED ORDER — MIDAZOLAM HCL 5 MG/5ML IJ SOLN
1.0000 mg | INTRAMUSCULAR | Status: DC | PRN
  Administered 2020-01-04: 1 mg via INTRAVENOUS
  Filled 2020-01-04: qty 5

## 2020-01-04 MED ORDER — FENTANYL CITRATE (PF) 100 MCG/2ML IJ SOLN
25.0000 ug | INTRAMUSCULAR | Status: DC | PRN
  Administered 2020-01-04: 50 ug via INTRAVENOUS
  Filled 2020-01-04: qty 2

## 2020-01-04 MED ORDER — TRIAMCINOLONE ACETONIDE 40 MG/ML IJ SUSP
40.0000 mg | Freq: Once | INTRAMUSCULAR | Status: AC
  Administered 2020-01-04: 40 mg
  Filled 2020-01-04: qty 1

## 2020-01-04 NOTE — Patient Instructions (Signed)
___________________________________________________________________________________________  Post-Radiofrequency (RF) Discharge Instructions  You have just completed a Radiofrequency Neurotomy.  The following instructions will provide you with information and guidelines for self-care upon discharge.  If at any time you have questions or concerns please call your physician. DO NOT DRIVE YOURSELF!!  Instructions:  Apply ice: Fill a plastic sandwich bag with crushed ice. Cover it with a small towel and apply to injection site. Apply for 15 minutes then remove x 15 minutes. Repeat sequence on day of procedure, until you go to bed. The purpose is to minimize swelling and discomfort after procedure.  Apply heat: Apply heat to procedure site starting the day following the procedure. The purpose is to treat any soreness and discomfort from the procedure.  Food intake: No eating limitations, unless stipulated above.  Nevertheless, if you have had sedation, you may experience some nausea.  In this case, it may be wise to wait at least two hours prior to resuming regular diet.  Physical activities: Keep activities to a minimum for the first 8 hours after the procedure. For the first 24 hours after the procedure, do not drive a motor vehicle,  Operate heavy machinery, power tools, or handle any weapons.  Consider walking with the use of an assistive device or accompanied by an adult for the first 24 hours.  Do not drink alcoholic beverages including beer.  Do not make any important decisions or sign any legal documents. Go home and rest today.  Resume activities tomorrow, as tolerated.  Use caution in moving about as you may experience mild leg weakness.  Use caution in cooking, use of household electrical appliances and climbing steps.  Driving: If you have received any sedation, you are not allowed to drive for 24 hours after your procedure.  Blood thinner: Restart your blood thinner 6 hours after your  procedure. (Only for those taking blood thinners)  Insulin: As soon as you can eat, you may resume your normal dosing schedule. (Only for those taking insulin)  Medications: May resume pre-procedure medications.  Do not take any drugs, other than what has been prescribed to you.  Infection prevention: Keep procedure site clean and dry.  Post-procedure Pain Diary: Extremely important that this be done correctly and accurately. Recorded information will be used to determine the next step in treatment.  Pain evaluated is that of treated area only. Do not include pain from an untreated area.  Complete every hour, on the hour, for the initial 8 hours. Set an alarm to help you do this part accurately.  Do not go to sleep and have it completed later. It will not be accurate.  Follow-up appointment: Keep your follow-up appointment after the procedure. Usually 2-6 weeks after radiofrequency. Bring you pain diary. The information collected will be essential for your long-term care.   Expect:  From numbing medicine (AKA: Local Anesthetics): Numbness or decrease in pain.  Onset: Full effect within 15 minutes of injected.  Duration: It will depend on the type of local anesthetic used. On the average, 1 to 8 hours.   From steroids (when added): Decrease in swelling or inflammation. Once inflammation is improved, relief of the pain will follow.  Onset of benefits: Depends on the amount of swelling present. The more swelling, the longer it will take for the benefits to be seen. In some cases, up to 10 days.  Duration: Steroids will stay in the system x 2 weeks. Duration of benefits will depend on multiple posibilities including persistent irritating factors.  From procedure: Some discomfort is to be expected once the numbing medicine wears off. In the case of radiofrequency procedures, this may last as long as 6 weeks. Additional post-procedure pain medication is provided for this. Discomfort is  minimized if ice and heat are applied as instructed.  Call if:  You experience numbness and weakness that gets worse with time, as opposed to wearing off.  He experience any unusual bleeding, difficulty breathing, or loss of the ability to control your bowel and bladder. (This applies to Spinal procedures only)  You experience any redness, swelling, heat, red streaks, elevated temperature, fever, or any other signs of a possible infection.  Emergency Numbers:  [facility] business hours (Monday - Thursday, 8:00 AM - 4:00 PM) (Friday, 9:00 AM - 12:00 Noon): [phone]  After hours: [phone] ____________________________________________________________________________________________

## 2020-01-04 NOTE — Progress Notes (Signed)
Safety precautions to be maintained throughout the outpatient stay will include: orient to surroundings, keep bed in low position, maintain call bell within reach at all times, provide assistance with transfer out of bed and ambulation.  

## 2020-01-04 NOTE — Progress Notes (Signed)
PROVIDER NOTE: Information contained herein reflects review and annotations entered in association with encounter. Interpretation of such information and data should be left to medically-trained personnel. Information provided to patient can be located elsewhere in the medical record under "Patient Instructions". Document created using STT-dictation technology, any transcriptional errors that may result from process are unintentional.    Patient: Kenneth Osborn  Service Category: Procedure  Provider: Gaspar Cola, MD  DOB: 1955/11/11  DOS: 01/04/2020  Location: [facility]  MRN: [id number]  Setting: Ambulatory - outpatient  Referring Provider: [name], MD  Type: Established Patient  Specialty: Interventional Pain Management  PCP: [name], MD   Primary Reason for Visit: Interventional Pain Management Treatment. CC: Procedure  Procedure:          Anesthesia, Analgesia, Anxiolysis:  Type: Thermal Lumbar Facet, Medial Branch Radiofrequency Ablation/Neurotomy  #2 (last done 12/03/2018)  Primary Purpose: Therapeutic Region: Posterolateral Lumbosacral Spine Level: L2, L3, L4, L5, & S1 Medial Branch Level(s). These levels will denervate the L3-4, L4-5, and the L5-S1 lumbar facet joints. Laterality: Left  Type: Moderate (Conscious) Sedation combined with Local Anesthesia Indication(s): Analgesia and Anxiety Route: Intravenous (IV) IV Access: Secured Sedation: Meaningful verbal contact was maintained at all times during the procedure  Local Anesthetic: Lidocaine 1-2%  Position: Prone   Indications: 1. Lumbar facet syndrome (Bilateral) (R>L)   2. Spondylosis without myelopathy or radiculopathy, lumbosacral region   3. Lumbar spondylosis   4. DDD (degenerative disc disease), lumbosacral   5. Chronic low back pain (Bilateral) w/o sciatica   6. Chronic anticoagulation (Effient)    Mr. Koors has been dealing with the above chronic pain for longer than three  months and has either failed to respond, was unable to tolerate, or simply did not get enough benefit from other more conservative therapies including, but not limited to: 1. Over-the-counter medications 2. Anti-inflammatory medications 3. Muscle relaxants 4. Membrane stabilizers 5. Opioids 6. Physical therapy and/or chiropractic manipulation 7. Modalities (Heat, ice, etc.) 8. Invasive techniques such as nerve blocks. Mr. Feltus has attained more than 50% relief of the pain from a series of diagnostic injections conducted in separate occasions.  Pain Score: Pre-procedure: 5 /10 Post-procedure: 0-No pain/10  Post-Procedure Evaluation  Procedure (12/02/2019): Therapeutic/palliative right lumbar facet RFA #2 under fluoroscopic guidance and IV sedation Pre-procedure pain level: 3/10 Post-procedure: 0/10 (100% relief)  Sedation: Sedation provided.  Effectiveness during initial hour after procedure(Ultra-Short Term Relief): 100 %.  Local anesthetic used: Long-acting (4-6 hours) Effectiveness: Defined as any analgesic benefit obtained secondary to the administration of local anesthetics. This carries significant diagnostic value as to the etiological location, or anatomical origin, of the pain. Duration of benefit is expected to coincide with the duration of the local anesthetic used.  Effectiveness during initial 4-6 hours after procedure(Short-Term Relief): 100 %.  Long-term benefit: Defined as any relief past the pharmacologic duration of the local anesthetics.  Effectiveness past the initial 6 hours after procedure(Long-Term Relief): 80 %.  Current benefits: Defined as benefit that persist at this time.   Analgesia:  >75% relief Function: Mr. Grahn reports improvement in function ROM: Mr. Weniger reports improvement in ROM The patient indicates that the lowermost area where the procedure was done on the right side still has some sharp pain associated with it.  However, it has only been 4  weeks since the procedure and radiofrequency ablations typically take 6 to 8 weeks to fully recover from the procedure.  We will continue to monitor.  Pre-op H&P Assessment:  Mr. Sossamon is a 64 y.o. (year old), male patient, seen today for interventional treatment. He  has a past surgical history that includes Cardiovascular stress test; Cardiac catheterization; Coronary angioplasty; Labrinthectomy; mastoid shunt; Appendectomy; Back surgery; Shoulder arthroscopy with subacromial decompression (Left, 04/06/2012); ARTHRODESIS ANTERIOR ANTERIOR CERVICLE SPINE (01/04/2013); Colonoscopy with propofol (N/A, 09/19/2014); Esophagogastroduodenoscopy (N/A, 09/19/2014); Savory dilation (N/A, 09/19/2014); Spinal cord stimulator implant (Right); Cardiac catheterization (N/A, 06/15/2015); Cardiac catheterization (N/A, 06/15/2015); Pulse generator implant (N/A, 01/18/2019); and Lumbar spinal cord simulator lead removal (Right, 08/09/2019). Mr. Franek has a current medication list which includes the following prescription(s): alprazolam, cetirizine, cyclobenzaprine, diazepam, diltiazem, enalapril, esomeprazole, ezetimibe, gabapentin, glucagon, humulin r u-500 kwikpen, isosorbide mononitrate, magnesium, magnesium oxide, meclizine, metformin, metoprolol succinate, montelukast, naloxone, naloxone, niacin, nitroglycerin, fish oil, [START ON 01/21/2020] oxycodone hcl, rosuvastatin, hydrocodone-acetaminophen, and [START ON 01/11/2020] hydrocodone-acetaminophen, and the following Facility-Administered Medications: fentanyl and midazolam. His primarily concern today is the Procedure  Initial Vital Signs:  Pulse/HCG Rate: 94ECG Heart Rate: 87 Temp: 97.9 F (36.6 C) Resp: 18 BP: 128/86 SpO2: 96 %  BMI: Estimated body mass index is 33.23 kg/m as calculated from the following:   Height as of this encounter: 5' 9"  (1.753 m).   Weight as of this encounter: 225 lb (102.1 kg).  Risk Assessment: Allergies: Reviewed. He has No Known Allergies.   Allergy Precautions: None required Coagulopathies: Reviewed. None identified.  Blood-thinner therapy: None at this time Active Infection(s): Reviewed. None identified. Mr. Galluzzo is afebrile  Site Confirmation: Mr. Proia was asked to confirm the procedure and laterality before marking the site Procedure checklist: Completed Consent: Before the procedure and under the influence of no sedative(s), amnesic(s), or anxiolytics, the patient was informed of the treatment options, risks and possible complications. To fulfill our ethical and legal obligations, as recommended by the American Medical Association's Code of Ethics, I have informed the patient of my clinical impression; the nature and purpose of the treatment or procedure; the risks, benefits, and possible complications of the intervention; the alternatives, including doing nothing; the risk(s) and benefit(s) of the alternative treatment(s) or procedure(s); and the risk(s) and benefit(s) of doing nothing. The patient was provided information about the general risks and possible complications associated with the procedure. These may include, but are not limited to: failure to achieve desired goals, infection, bleeding, organ or nerve damage, allergic reactions, paralysis, and death. In addition, the patient was informed of those risks and complications associated to Spine-related procedures, such as failure to decrease pain; infection (i.e.: Meningitis, epidural or intraspinal abscess); bleeding (i.e.: epidural hematoma, subarachnoid hemorrhage, or any other type of intraspinal or peri-dural bleeding); organ or nerve damage (i.e.: Any type of peripheral nerve, nerve root, or spinal cord injury) with subsequent damage to sensory, motor, and/or autonomic systems, resulting in permanent pain, numbness, and/or weakness of one or several areas of the body; allergic reactions; (i.e.: anaphylactic reaction); and/or death. Furthermore, the patient was informed  of those risks and complications associated with the medications. These include, but are not limited to: allergic reactions (i.e.: anaphylactic or anaphylactoid reaction(s)); adrenal axis suppression; blood sugar elevation that in diabetics may result in ketoacidosis or comma; water retention that in patients with history of congestive heart failure may result in shortness of breath, pulmonary edema, and decompensation with resultant heart failure; weight gain; swelling or edema; medication-induced neural toxicity; particulate matter embolism and blood vessel occlusion with resultant organ, and/or nervous system infarction; and/or aseptic necrosis of one or more joints. Finally,  the patient was informed that Medicine is not an exact science; therefore, there is also the possibility of unforeseen or unpredictable risks and/or possible complications that may result in a catastrophic outcome. The patient indicated having understood very clearly. We have given the patient no guarantees and we have made no promises. Enough time was given to the patient to ask questions, all of which were answered to the patient's satisfaction. Mr. Steppe has indicated that he wanted to continue with the procedure. Attestation: I, the ordering provider, attest that I have discussed with the patient the benefits, risks, side-effects, alternatives, likelihood of achieving goals, and potential problems during recovery for the procedure that I have provided informed consent. Date  Time: 01/04/2020 10:04 AM  Pre-Procedure Preparation:  Monitoring: As per clinic protocol. Respiration, ETCO2, SpO2, BP, heart rate and rhythm monitor placed and checked for adequate function Safety Precautions: Patient was assessed for positional comfort and pressure points before starting the procedure. Time-out: I initiated and conducted the "Time-out" before starting the procedure, as per protocol. The patient was asked to participate by confirming the  accuracy of the "Time Out" information. Verification of the correct person, site, and procedure were performed and confirmed by me, the nursing staff, and the patient. "Time-out" conducted as per Joint Commission's Universal Protocol (UP.01.01.01). Time: 1055  Description of Procedure:          Laterality: Left Levels:  L2, L3, L4, L5, & S1 Medial Branch Level(s), at the L3-4, L4-5, and the L5-S1 lumbar facet joints. Area Prepped: Lumbosacral DuraPrep (Iodine Povacrylex [0.7% available iodine] and Isopropyl Alcohol, 74% w/w) Safety Precautions: Aspiration looking for blood return was conducted prior to all injections. At no point did we inject any substances, as a needle was being advanced. Before injecting, the patient was told to immediately notify me if he was experiencing any new onset of "ringing in the ears, or metallic taste in the mouth". No attempts were made at seeking any paresthesias. Safe injection practices and needle disposal techniques used. Medications properly checked for expiration dates. SDV (single dose vial) medications used. After the completion of the procedure, all disposable equipment used was discarded in the proper designated medical waste containers. Local Anesthesia: Protocol guidelines were followed. The patient was positioned over the fluoroscopy table. The area was prepped in the usual manner. The time-out was completed. The target area was identified using fluoroscopy. A 12-in long, straight, sterile hemostat was used with fluoroscopic guidance to locate the targets for each level blocked. Once located, the skin was marked with an approved surgical skin marker. Once all sites were marked, the skin (epidermis, dermis, and hypodermis), as well as deeper tissues (fat, connective tissue and muscle) were infiltrated with a small amount of a short-acting local anesthetic, loaded on a 10cc syringe with a 25G, 1.5-in  Needle. An appropriate amount of time was allowed for local  anesthetics to take effect before proceeding to the next step. Local Anesthetic: Lidocaine 2.0% The unused portion of the local anesthetic was discarded in the proper designated containers. Technical explanation of process:  Radiofrequency Ablation (RFA) L2 Medial Branch Nerve RFA: The target area for the L2 medial branch is at the junction of the postero-lateral aspect of the superior articular process and the superior, posterior, and medial edge of the transverse process of L3. Under fluoroscopic guidance, a Radiofrequency needle was inserted until contact was made with os over the superior postero-lateral aspect of the pedicular shadow (target area). Sensory and motor testing was conducted  to properly adjust the position of the needle. Once satisfactory placement of the needle was achieved, the numbing solution was slowly injected after negative aspiration for blood. 2.0 mL of the nerve block solution was injected without difficulty or complication. After waiting for at least 3 minutes, the ablation was performed. Once completed, the needle was removed intact. L3 Medial Branch Nerve RFA: The target area for the L3 medial branch is at the junction of the postero-lateral aspect of the superior articular process and the superior, posterior, and medial edge of the transverse process of L4. Under fluoroscopic guidance, a Radiofrequency needle was inserted until contact was made with os over the superior postero-lateral aspect of the pedicular shadow (target area). Sensory and motor testing was conducted to properly adjust the position of the needle. Once satisfactory placement of the needle was achieved, the numbing solution was slowly injected after negative aspiration for blood. 2.0 mL of the nerve block solution was injected without difficulty or complication. After waiting for at least 3 minutes, the ablation was performed. Once completed, the needle was removed intact. L4 Medial Branch Nerve RFA: The target  area for the L4 medial branch is at the junction of the postero-lateral aspect of the superior articular process and the superior, posterior, and medial edge of the transverse process of L5. Under fluoroscopic guidance, a Radiofrequency needle was inserted until contact was made with os over the superior postero-lateral aspect of the pedicular shadow (target area). Sensory and motor testing was conducted to properly adjust the position of the needle. Once satisfactory placement of the needle was achieved, the numbing solution was slowly injected after negative aspiration for blood. 2.0 mL of the nerve block solution was injected without difficulty or complication. After waiting for at least 3 minutes, the ablation was performed. Once completed, the needle was removed intact. L5 Medial Branch Nerve RFA: The target area for the L5 medial branch is at the junction of the postero-lateral aspect of the superior articular process of S1 and the superior, posterior, and medial edge of the sacral ala. Under fluoroscopic guidance, a Radiofrequency needle was inserted until contact was made with os over the superior postero-lateral aspect of the pedicular shadow (target area). Sensory and motor testing was conducted to properly adjust the position of the needle. Once satisfactory placement of the needle was achieved, the numbing solution was slowly injected after negative aspiration for blood. 2.0 mL of the nerve block solution was injected without difficulty or complication. After waiting for at least 3 minutes, the ablation was performed. Once completed, the needle was removed intact. S1 Medial Branch Nerve RFA: The target area for the S1 medial branch is located inferior to the junction of the S1 superior articular process and the L5 inferior articular process, posterior, inferior, and lateral to the 6 o'clock position of the L5-S1 facet joint, just superior to the S1 posterior foramen. Under fluoroscopic guidance, the  Radiofrequency needle was advanced until contact was made with os over the Target area. Sensory and motor testing was conducted to properly adjust the position of the needle. Once satisfactory placement of the needle was achieved, the numbing solution was slowly injected after negative aspiration for blood. 2.0 mL of the nerve block solution was injected without difficulty or complication. After waiting for at least 3 minutes, the ablation was performed. Once completed, the needle was removed intact. Radiofrequency lesioning (ablation):  Radiofrequency Generator: NeuroTherm [id number] Sensory Stimulation Parameters: 50 Hz was used to locate & identify the nerve, making  sure that the needle was positioned such that there was no sensory stimulation below 0.3 V or above 0.7 V. Motor Stimulation Parameters: 2 Hz was used to evaluate the motor component. Care was taken not to lesion any nerves that demonstrated motor stimulation of the lower extremities at an output of less than 2.5 times that of the sensory threshold, or a maximum of 2.0 V. Lesioning Technique Parameters: Standard Radiofrequency settings. (Not bipolar or pulsed.) Temperature Settings: 80 degrees C Lesioning time: 60 seconds Intra-operative Compliance: Compliant.  However, we did notice that the patient was a little bit more sedated than expected with only 1 mg of Versed and 50 mcg of fentanyl.  His level of sedation was moderate and we could easily talk to him and he would answer questions, but he was falling back to sleep easily as well. Materials & Medications: Needle(s) (Electrode/Cannula) Type: Teflon-coated, curved tip, Radiofrequency needle(s) Gauge: 20G Length: 15cm Numbing solution: 0.2% PF-Ropivacaine + Triamcinolone (40 mg/mL) diluted to a final concentration of 4 mg of Triamcinolone/mL of Ropivacaine The unused portion of the solution was discarded in the proper designated containers.  Once the entire procedure was completed, the  treated area was cleaned, making sure to leave some of the prepping solution back to take advantage of its long term bactericidal properties.  Illustration of the posterior view of the lumbar spine and the posterior neural structures. Laminae of L2 through S1 are labeled. DPRL5, dorsal primary ramus of L5; DPRS1, dorsal primary ramus of S1; DPR3, dorsal primary ramus of L3; FJ, facet (zygapophyseal) joint L3-L4; I, inferior articular process of L4; LB1, lateral branch of dorsal primary ramus of L1; IAB, inferior articular branches from L3 medial branch (supplies L4-L5 facet joint); IBP, intermediate branch plexus; MB3, medial branch of dorsal primary ramus of L3; NR3, third lumbar nerve root; S, superior articular process of L5; SAB, superior articular branches from L4 (supplies L4-5 facet joint also); TP3, transverse process of L3.  Vitals:   01/04/20 1138 01/04/20 1148 01/04/20 1158 01/04/20 1208  BP: 132/89 (!) 138/100 103/81 100/78  Pulse:      Resp: 17 17 14 17   Temp:  (!) 97.4 F (36.3 C)  (!) 97.5 F (36.4 C)  SpO2: 95% 96% 97% 96%  Weight:      Height:       Start Time: 1055 hrs. End Time: 1136 hrs.  Imaging Guidance (Spinal):          Type of Imaging Technique: Fluoroscopy Guidance (Spinal) Indication(s): Assistance in needle guidance and placement for procedures requiring needle placement in or near specific anatomical locations not easily accessible without such assistance. Exposure Time: Please see nurses notes. Contrast: None used. Fluoroscopic Guidance: I was personally present during the use of fluoroscopy. "Tunnel Vision Technique" used to obtain the best possible view of the target area. Parallax error corrected before commencing the procedure. "Direction-depth-direction" technique used to introduce the needle under continuous pulsed fluoroscopy. Once target was reached, antero-posterior, oblique, and lateral fluoroscopic projection used confirm needle placement in all  planes. Images permanently stored in EMR. Interpretation: No contrast injected. I personally interpreted the imaging intraoperatively. Adequate needle placement confirmed in multiple planes. Permanent images saved into the patient's record.  Antibiotic Prophylaxis:   Anti-infectives (From admission, onward)   None     Indication(s): None identified  Post-operative Assessment:  Post-procedure Vital Signs:  Pulse/HCG Rate: 9486 Temp: (!) 97.5 F (36.4 C) Resp: 17 BP: 100/78 SpO2: 96 %  EBL: None  Complications: No immediate post-treatment complications observed by team, or reported by patient.  Note: The patient tolerated the entire procedure well. A repeat set of vitals were taken after the procedure and the patient was kept under observation following institutional policy, for this type of procedure. Post-procedural neurological assessment was performed, showing return to baseline, prior to discharge. The patient was provided with post-procedure discharge instructions, including a section on how to identify potential problems. Should any problems arise concerning this procedure, the patient was given instructions to immediately contact us, at any time, without hesitation. In any case, we plan to contact the patient by telephone for a follow-up status report regarding this interventional procedure.  Comments:  No additional relevant information.  Plan of Care  Orders:  Orders Placed This Encounter  Procedures  . Radiofrequency,Lumbar    Scheduling Instructions:     Side(s): Left-sided     Level: L3-4, L4-5, & L5-S1 Facets (L2, L3, L4, L5, & S1 Medial Branch Nerves)     Sedation: Patient's choice.     Timeframe: Today    Order Specific Question:   Where will this procedure be performed?    Answer:   [facility]  . DG PAIN [facility] C-ARM 1-60 MIN NO REPORT    Intraoperative interpretation by procedural physician at [place].    Standing Status:   Standing      Number of Occurrences:   1    Order Specific Question:   Reason for exam:    Answer:   Assistance in needle guidance and placement for procedures requiring needle placement in or near specific anatomical locations not easily accessible without such assistance.  . Informed Consent Details: Physician/Practitioner Attestation; Transcribe to consent form and obtain patient signature    Nursing Order: Transcribe to consent form and obtain patient signature. Note: Always confirm laterality of pain with Mr. Goytia, before procedure.    Order Specific Question:   Physician/Practitioner attestation of informed consent for procedure/surgical case    Answer:   I, the physician/practitioner, attest that I have discussed with the patient the benefits, risks, side effects, alternatives, likelihood of achieving goals and potential problems during recovery for the procedure that I have provided informed consent.    Order Specific Question:   Procedure    Answer:   Lumbar Facet Radiofrequency Ablation    Order Specific Question:   Physician/Practitioner performing the procedure    Answer:   Aiman Noe A. Dossie Arbour, MD    Order Specific Question:   Indication/Reason    Answer:   Low Back Pain, with our without leg pain, due to Facet Joint Arthralgia (Joint Pain) known as Lumbar Facet Syndrome, secondary to Lumbar, and/or Lumbosacral Spondylosis (Arthritis of the Spine), without myelopathy or radiculopathy (Nerve Damage).  . Care order/instruction: Please confirm that the patient has stopped the Effient (Prasugrel) x 10 days prior to procedure or surgery.    Please confirm that the patient has stopped the Effient (Prasugrel) x 10 days prior to procedure or surgery.    Standing Status:   Standing    Number of Occurrences:   1  . Provide equipment / supplies at bedside    "Radiofrequency Tray"; Large hemostat (x1); Small hemostat (x1); Towels (x8); 4x4 sterile sponge pack (x1) Needle type: Teflon-coated  Radiofrequency Needle (Disposable  single use) Size: Long Quantity: 5    Standing Status:   Standing    Number of Occurrences:   1    Order Specific Question:   Specify  Answer:   Radiofrequency Tray  . Nursing Instructions:    Please complete this patient's postprocedure evaluation.    Scheduling Instructions:     Please complete this patient's postprocedure evaluation.  . Bleeding precautions    Standing Status:   Standing    Number of Occurrences:   1   Chronic Opioid Analgesic:  Oxycodone IR 10 mg, 1 tab PO q 6 hrs (40 mg/day of oxycodone) MME/day:60 mg/day.   Medications ordered for procedure: Meds ordered this encounter  Medications  . lidocaine (XYLOCAINE) 2 % (with pres) injection 400 mg  . lactated ringers infusion 1,000 mL  . midazolam (VERSED) 5 MG/5ML injection 1-2 mg    Make sure Flumazenil is available in the pyxis when using this medication. If oversedation occurs, administer 0.2 mg IV over 15 sec. If after 45 sec no response, administer 0.2 mg again over 1 min; may repeat at 1 min intervals; not to exceed 4 doses (1 mg)  . fentaNYL (SUBLIMAZE) injection 25-50 mcg    Make sure Narcan is available in the pyxis when using this medication. In the event of respiratory depression (RR< 8/min): Titrate NARCAN (naloxone) in increments of 0.1 to 0.2 mg IV at 2-3 minute intervals, until desired degree of reversal.  . ropivacaine (PF) 2 mg/mL (0.2%) (NAROPIN) injection 9 mL  . triamcinolone acetonide (KENALOG-40) injection 40 mg  . HYDROcodone-acetaminophen (NORCO/VICODIN) 5-325 MG tablet    Sig: Take 1 tablet by mouth every 8 (eight) hours as needed for up to 7 days for severe pain. Must last 7 days.    Dispense:  21 tablet    Refill:  0    For acute post-operative pain. Not to be refilled. Must last 7 days.  Marland Kitchen HYDROcodone-acetaminophen (NORCO/VICODIN) 5-325 MG tablet    Sig: Take 1 tablet by mouth every 8 (eight) hours as needed for up to 7 days for severe pain. Must  last for 7 days.    Dispense:  21 tablet    Refill:  0    For acute post-operative pain. Not to be refilled. Must last 7 days.   Medications administered: We administered lidocaine, lactated ringers, midazolam, fentaNYL, ropivacaine (PF) 2 mg/mL (0.2%), and triamcinolone acetonide.  See the medical record for exact dosing, route, and time of administration.  Follow-up plan:   Return in about 6 weeks (around 02/15/2020) for (F2F), (PP) Follow-up.       Considering: NOTE: EFFIENT Anticoagulation (Stop: 7-10 days  Re-start: 6 hrs)    Palliative PRN treatment(s): Palliative right PSIS MNB/TPI  Diagnostic/palliative right lumbar facet block #3  Diagnostic/palliative left lumbar facet block #2  Palliative left lumbar facet RFA #2 (last done 12/03/2018)  Palliative right lumbar facet RFA #3 (last done 12/02/2019)     Recent Visits Date Type Provider Dept  12/02/19 Procedure visit Milinda Pointer, MD [facility]  11/22/19 Office Visit Milinda Pointer, MD [facility]  Showing recent visits within past 90 days and meeting all other requirements Today's Visits Date Type Provider Dept  01/04/20 Procedure visit Milinda Pointer, MD [facility]  Showing today's visits and meeting all other requirements Future Appointments Date Type Provider Dept  02/14/20 Appointment Milinda Pointer, MD [facility]  02/21/20 Appointment Milinda Pointer, MD [facility]  Showing future appointments within next 90 days and meeting all other requirements  Disposition: Discharge home  Discharge (Date  Time): 01/04/2020; 1214 hrs.   Primary Care Physician: [name], MD Location: [facility]  Facility Note by: Gaspar Cola, MD Date: 01/04/2020; Time: 12:18 PM  Disclaimer:  Medicine is not an Chief Strategy Officer. The only guarantee in medicine is that nothing is guaranteed. It is important to note that the  decision to proceed with this intervention was based on the information collected from the patient. The Data and conclusions were drawn from the patient's questionnaire, the interview, and the physical examination. Because the information was provided in large part by the patient, it cannot be guaranteed that it has not been purposely or unconsciously manipulated. Every effort has been made to obtain as much relevant data as possible for this evaluation. It is important to note that the conclusions that lead to this procedure are derived in large part from the available data. Always take into account that the treatment will also be dependent on availability of resources and existing treatment guidelines, considered by other Pain Management Practitioners as being common knowledge and practice, at the time of the intervention. For Medico-Legal purposes, it is also important to point out that variation in procedural techniques and pharmacological choices are the acceptable norm. The indications, contraindications, technique, and results of the above procedure should only be interpreted and judged by a Board-Certified Interventional Pain Specialist with extensive familiarity and expertise in the same exact procedure and technique.

## 2020-01-05 ENCOUNTER — Telehealth: Payer: Self-pay

## 2020-01-05 NOTE — Telephone Encounter (Signed)
Post procedure phone call.  Patient states he is doing well.

## 2020-01-13 ENCOUNTER — Other Ambulatory Visit: Payer: Self-pay | Admitting: Pain Medicine

## 2020-01-13 DIAGNOSIS — M792 Neuralgia and neuritis, unspecified: Secondary | ICD-10-CM

## 2020-01-13 DIAGNOSIS — E0842 Diabetes mellitus due to underlying condition with diabetic polyneuropathy: Secondary | ICD-10-CM

## 2020-01-13 DIAGNOSIS — G629 Polyneuropathy, unspecified: Secondary | ICD-10-CM

## 2020-01-19 DIAGNOSIS — C801 Malignant (primary) neoplasm, unspecified: Secondary | ICD-10-CM

## 2020-01-19 HISTORY — DX: Malignant (primary) neoplasm, unspecified: C80.1

## 2020-02-07 ENCOUNTER — Emergency Department: Payer: Medicare Other

## 2020-02-07 ENCOUNTER — Other Ambulatory Visit: Payer: Self-pay

## 2020-02-07 ENCOUNTER — Encounter: Payer: Self-pay | Admitting: Emergency Medicine

## 2020-02-07 ENCOUNTER — Inpatient Hospital Stay
Admission: EM | Admit: 2020-02-07 | Discharge: 2020-02-18 | DRG: 871 | Disposition: A | Discharge status: Home Health | Payer: Medicare Other | Attending: Internal Medicine | Admitting: Internal Medicine

## 2020-02-07 DIAGNOSIS — A419 Sepsis, unspecified organism: Secondary | ICD-10-CM | POA: Diagnosis not present

## 2020-02-07 DIAGNOSIS — R1011 Right upper quadrant pain: Secondary | ICD-10-CM | POA: Diagnosis not present

## 2020-02-07 DIAGNOSIS — I2510 Atherosclerotic heart disease of native coronary artery without angina pectoris: Secondary | ICD-10-CM | POA: Diagnosis not present

## 2020-02-07 DIAGNOSIS — E872 Acidosis: Secondary | ICD-10-CM | POA: Diagnosis present

## 2020-02-07 DIAGNOSIS — N2889 Other specified disorders of kidney and ureter: Secondary | ICD-10-CM | POA: Diagnosis not present

## 2020-02-07 DIAGNOSIS — A4189 Other specified sepsis: Secondary | ICD-10-CM | POA: Diagnosis not present

## 2020-02-07 DIAGNOSIS — I10 Essential (primary) hypertension: Secondary | ICD-10-CM | POA: Diagnosis not present

## 2020-02-07 DIAGNOSIS — Z6833 Body mass index (BMI) 33.0-33.9, adult: Secondary | ICD-10-CM

## 2020-02-07 DIAGNOSIS — R6520 Severe sepsis without septic shock: Secondary | ICD-10-CM

## 2020-02-07 DIAGNOSIS — R933 Abnormal findings on diagnostic imaging of other parts of digestive tract: Secondary | ICD-10-CM | POA: Diagnosis not present

## 2020-02-07 DIAGNOSIS — E785 Hyperlipidemia, unspecified: Secondary | ICD-10-CM | POA: Diagnosis present

## 2020-02-07 DIAGNOSIS — E875 Hyperkalemia: Secondary | ICD-10-CM | POA: Diagnosis present

## 2020-02-07 DIAGNOSIS — J9601 Acute respiratory failure with hypoxia: Secondary | ICD-10-CM | POA: Diagnosis not present

## 2020-02-07 DIAGNOSIS — E119 Type 2 diabetes mellitus without complications: Secondary | ICD-10-CM

## 2020-02-07 DIAGNOSIS — E1122 Type 2 diabetes mellitus with diabetic chronic kidney disease: Secondary | ICD-10-CM | POA: Diagnosis present

## 2020-02-07 DIAGNOSIS — K8000 Calculus of gallbladder with acute cholecystitis without obstruction: Secondary | ICD-10-CM | POA: Diagnosis present

## 2020-02-07 DIAGNOSIS — E1140 Type 2 diabetes mellitus with diabetic neuropathy, unspecified: Secondary | ICD-10-CM | POA: Diagnosis present

## 2020-02-07 DIAGNOSIS — R748 Abnormal levels of other serum enzymes: Secondary | ICD-10-CM | POA: Diagnosis not present

## 2020-02-07 DIAGNOSIS — E1165 Type 2 diabetes mellitus with hyperglycemia: Secondary | ICD-10-CM | POA: Diagnosis present

## 2020-02-07 DIAGNOSIS — Z66 Do not resuscitate: Secondary | ICD-10-CM | POA: Diagnosis present

## 2020-02-07 DIAGNOSIS — R945 Abnormal results of liver function studies: Secondary | ICD-10-CM | POA: Diagnosis not present

## 2020-02-07 DIAGNOSIS — R7989 Other specified abnormal findings of blood chemistry: Secondary | ICD-10-CM

## 2020-02-07 DIAGNOSIS — G9341 Metabolic encephalopathy: Secondary | ICD-10-CM | POA: Diagnosis present

## 2020-02-07 DIAGNOSIS — Z9049 Acquired absence of other specified parts of digestive tract: Secondary | ICD-10-CM | POA: Diagnosis not present

## 2020-02-07 DIAGNOSIS — E7800 Pure hypercholesterolemia, unspecified: Secondary | ICD-10-CM | POA: Diagnosis present

## 2020-02-07 DIAGNOSIS — R4020 Unspecified coma: Secondary | ICD-10-CM | POA: Diagnosis present

## 2020-02-07 DIAGNOSIS — I129 Hypertensive chronic kidney disease with stage 1 through stage 4 chronic kidney disease, or unspecified chronic kidney disease: Secondary | ICD-10-CM | POA: Diagnosis present

## 2020-02-07 DIAGNOSIS — I252 Old myocardial infarction: Secondary | ICD-10-CM

## 2020-02-07 DIAGNOSIS — N183 Chronic kidney disease, stage 3 unspecified: Secondary | ICD-10-CM | POA: Diagnosis present

## 2020-02-07 DIAGNOSIS — E1169 Type 2 diabetes mellitus with other specified complication: Secondary | ICD-10-CM | POA: Diagnosis present

## 2020-02-07 DIAGNOSIS — J449 Chronic obstructive pulmonary disease, unspecified: Secondary | ICD-10-CM | POA: Diagnosis present

## 2020-02-07 DIAGNOSIS — A4151 Sepsis due to Escherichia coli [E. coli]: Secondary | ICD-10-CM | POA: Diagnosis not present

## 2020-02-07 DIAGNOSIS — K810 Acute cholecystitis: Secondary | ICD-10-CM | POA: Diagnosis present

## 2020-02-07 DIAGNOSIS — N1831 Chronic kidney disease, stage 3a: Secondary | ICD-10-CM | POA: Diagnosis present

## 2020-02-07 DIAGNOSIS — R079 Chest pain, unspecified: Secondary | ICD-10-CM | POA: Diagnosis not present

## 2020-02-07 DIAGNOSIS — K219 Gastro-esophageal reflux disease without esophagitis: Secondary | ICD-10-CM | POA: Diagnosis present

## 2020-02-07 DIAGNOSIS — Z20822 Contact with and (suspected) exposure to covid-19: Secondary | ICD-10-CM | POA: Diagnosis present

## 2020-02-07 DIAGNOSIS — Z955 Presence of coronary angioplasty implant and graft: Secondary | ICD-10-CM | POA: Diagnosis not present

## 2020-02-07 DIAGNOSIS — Z0189 Encounter for other specified special examinations: Secondary | ICD-10-CM

## 2020-02-07 DIAGNOSIS — R6510 Systemic inflammatory response syndrome (SIRS) of non-infectious origin without acute organ dysfunction: Secondary | ICD-10-CM

## 2020-02-07 DIAGNOSIS — N179 Acute kidney failure, unspecified: Secondary | ICD-10-CM | POA: Diagnosis not present

## 2020-02-07 DIAGNOSIS — G4730 Sleep apnea, unspecified: Secondary | ICD-10-CM | POA: Diagnosis present

## 2020-02-07 DIAGNOSIS — E876 Hypokalemia: Secondary | ICD-10-CM

## 2020-02-07 DIAGNOSIS — R6521 Severe sepsis with septic shock: Secondary | ICD-10-CM | POA: Diagnosis not present

## 2020-02-07 DIAGNOSIS — Z794 Long term (current) use of insulin: Secondary | ICD-10-CM

## 2020-02-07 DIAGNOSIS — R0602 Shortness of breath: Secondary | ICD-10-CM

## 2020-02-07 DIAGNOSIS — H8109 Meniere's disease, unspecified ear: Secondary | ICD-10-CM | POA: Diagnosis present

## 2020-02-07 DIAGNOSIS — R109 Unspecified abdominal pain: Secondary | ICD-10-CM

## 2020-02-07 DIAGNOSIS — B961 Klebsiella pneumoniae [K. pneumoniae] as the cause of diseases classified elsewhere: Secondary | ICD-10-CM | POA: Diagnosis present

## 2020-02-07 DIAGNOSIS — Z7901 Long term (current) use of anticoagulants: Secondary | ICD-10-CM

## 2020-02-07 DIAGNOSIS — N1832 Chronic kidney disease, stage 3b: Secondary | ICD-10-CM | POA: Diagnosis present

## 2020-02-07 DIAGNOSIS — B9620 Unspecified Escherichia coli [E. coli] as the cause of diseases classified elsewhere: Secondary | ICD-10-CM | POA: Diagnosis present

## 2020-02-07 DIAGNOSIS — Z9911 Dependence on respirator [ventilator] status: Secondary | ICD-10-CM | POA: Diagnosis not present

## 2020-02-07 DIAGNOSIS — K828 Other specified diseases of gallbladder: Secondary | ICD-10-CM | POA: Diagnosis present

## 2020-02-07 DIAGNOSIS — Z8669 Personal history of other diseases of the nervous system and sense organs: Secondary | ICD-10-CM

## 2020-02-07 DIAGNOSIS — Z978 Presence of other specified devices: Secondary | ICD-10-CM

## 2020-02-07 DIAGNOSIS — G8929 Other chronic pain: Secondary | ICD-10-CM | POA: Diagnosis present

## 2020-02-07 HISTORY — DX: Sepsis, unspecified organism: A41.9

## 2020-02-07 HISTORY — DX: Sepsis, unspecified organism: R65.20

## 2020-02-07 LAB — URINALYSIS, COMPLETE (UACMP) WITH MICROSCOPIC
Glucose, UA: 50 mg/dL — AB
Ketones, ur: NEGATIVE mg/dL
Leukocytes,Ua: NEGATIVE
Nitrite: NEGATIVE
Protein, ur: 100 mg/dL — AB
Specific Gravity, Urine: 1.029 (ref 1.005–1.030)
pH: 5 (ref 5.0–8.0)

## 2020-02-07 LAB — BASIC METABOLIC PANEL
Anion gap: 15 (ref 5–15)
BUN: 35 mg/dL — ABNORMAL HIGH (ref 8–23)
CO2: 20 mmol/L — ABNORMAL LOW (ref 22–32)
Calcium: 10 mg/dL (ref 8.9–10.3)
Chloride: 98 mmol/L (ref 98–111)
Creatinine, Ser: 1.84 mg/dL — ABNORMAL HIGH (ref 0.61–1.24)
GFR, Estimated: 40 mL/min — ABNORMAL LOW (ref >60)
Glucose, Bld: 240 mg/dL — ABNORMAL HIGH (ref 70–99)
Potassium: 5.9 mmol/L — ABNORMAL HIGH (ref 3.5–5.1)
Sodium: 133 mmol/L — ABNORMAL LOW (ref 135–145)

## 2020-02-07 LAB — HEPATIC FUNCTION PANEL
ALT: 1275 U/L — ABNORMAL HIGH (ref 0–44)
AST: 901 U/L — ABNORMAL HIGH (ref 15–41)
Albumin: 4.2 g/dL (ref 3.5–5.0)
Alkaline Phosphatase: 150 U/L — ABNORMAL HIGH (ref 38–126)
Bilirubin, Direct: 3.6 mg/dL — ABNORMAL HIGH (ref 0.0–0.2)
Indirect Bilirubin: 1.4 mg/dL — ABNORMAL HIGH (ref 0.3–0.9)
Total Bilirubin: 5 mg/dL — ABNORMAL HIGH (ref 0.3–1.2)
Total Protein: 7.6 g/dL (ref 6.5–8.1)

## 2020-02-07 LAB — CBC
HCT: 47.3 % (ref 39.0–52.0)
Hemoglobin: 15.9 g/dL (ref 13.0–17.0)
MCH: 27.7 pg (ref 26.0–34.0)
MCHC: 33.6 g/dL (ref 30.0–36.0)
MCV: 82.4 fL (ref 80.0–100.0)
Platelets: 212 10*3/uL (ref 150–400)
RBC: 5.74 MIL/uL (ref 4.22–5.81)
RDW: 13.8 % (ref 11.5–15.5)
WBC: 10.9 10*3/uL — ABNORMAL HIGH (ref 4.0–10.5)
nRBC: 0 % (ref 0.0–0.2)

## 2020-02-07 LAB — RESP PANEL BY RT-PCR (FLU A&B, COVID) ARPGX2
Influenza A by PCR: NEGATIVE
Influenza B by PCR: NEGATIVE
SARS Coronavirus 2 by RT PCR: NEGATIVE

## 2020-02-07 LAB — AMMONIA: Ammonia: 24 umol/L (ref 9–35)

## 2020-02-07 LAB — LACTIC ACID, PLASMA
Lactic Acid, Venous: 3.1 mmol/L (ref 0.5–1.9)
Lactic Acid, Venous: 1.8 mmol/L (ref 0.5–1.9)

## 2020-02-07 LAB — LIPASE, BLOOD: Lipase: 22 U/L (ref 11–51)

## 2020-02-07 LAB — TROPONIN I (HIGH SENSITIVITY)
Troponin I (High Sensitivity): 8 ng/L (ref <18)
Troponin I (High Sensitivity): 8 ng/L (ref <18)

## 2020-02-07 LAB — CBG MONITORING, ED: Glucose-Capillary: 193 mg/dL — ABNORMAL HIGH (ref 70–99)

## 2020-02-07 MED ORDER — SODIUM CHLORIDE 0.9 % IV SOLN: INTRAVENOUS | Status: DC

## 2020-02-07 MED ORDER — MONTELUKAST SODIUM 10 MG PO TABS
10.0000 mg | ORAL_TABLET | Freq: Every day | ORAL | Status: DC
  Administered 2020-02-08: 21:00:00 10 mg via ORAL
  Filled 2020-02-07 (×2): qty 1

## 2020-02-07 MED ORDER — LACTATED RINGERS IV BOLUS (SEPSIS): 500.0000 mL | Freq: Once | INTRAVENOUS | Status: DC

## 2020-02-07 MED ORDER — MECLIZINE HCL 25 MG PO TABS
25.0000 mg | ORAL_TABLET | Freq: Three times a day (TID) | ORAL | Status: DC | PRN
  Filled 2020-02-07: qty 1

## 2020-02-07 MED ORDER — SODIUM CHLORIDE 0.9 % IV SOLN: 2.0000 g | INTRAVENOUS | Status: DC

## 2020-02-07 MED ORDER — GABAPENTIN 400 MG PO CAPS
1600.0000 mg | ORAL_CAPSULE | Freq: Two times a day (BID) | ORAL | Status: DC
  Administered 2020-02-08 – 2020-02-10 (×5): 1600 mg via ORAL
  Filled 2020-02-07 (×4): qty 4
  Filled 2020-02-07: qty 16
  Filled 2020-02-07 (×2): qty 4

## 2020-02-07 MED ORDER — LORATADINE 10 MG PO TABS
10.0000 mg | ORAL_TABLET | Freq: Every day | ORAL | Status: DC
  Administered 2020-02-08 – 2020-02-09 (×2): 10 mg via ORAL
  Filled 2020-02-07 (×2): qty 1

## 2020-02-07 MED ORDER — ALPRAZOLAM 0.5 MG PO TABS
0.5000 mg | ORAL_TABLET | Freq: Once | ORAL | Status: AC
  Administered 2020-02-07: 22:00:00 0.5 mg via ORAL
  Filled 2020-02-07: qty 1

## 2020-02-07 MED ORDER — ALPRAZOLAM 0.5 MG PO TABS: 0.5000 mg | ORAL_TABLET | Freq: Every day | ORAL | Status: DC

## 2020-02-07 MED ORDER — ONDANSETRON HCL 4 MG/2ML IJ SOLN
4.0000 mg | Freq: Four times a day (QID) | INTRAMUSCULAR | Status: DC | PRN
  Administered 2020-02-09: 15:00:00 4 mg via INTRAVENOUS

## 2020-02-07 MED ORDER — ACETAMINOPHEN 500 MG PO TABS
1000.0000 mg | ORAL_TABLET | ORAL | Status: AC
  Administered 2020-02-07: 18:00:00 1000 mg via ORAL
  Filled 2020-02-07: qty 2

## 2020-02-07 MED ORDER — IOHEXOL 350 MG/ML SOLN
75.0000 mL | Freq: Once | INTRAVENOUS | Status: AC | PRN
  Administered 2020-02-07: 20:00:00 75 mL via INTRAVENOUS

## 2020-02-07 MED ORDER — LACTATED RINGERS IV BOLUS (SEPSIS): 1000.0000 mL | Freq: Once | INTRAVENOUS | Status: DC

## 2020-02-07 MED ORDER — METRONIDAZOLE IN NACL 5-0.79 MG/ML-% IV SOLN
500.0000 mg | Freq: Once | INTRAVENOUS | Status: DC
  Filled 2020-02-07: qty 100

## 2020-02-07 MED ORDER — ONDANSETRON HCL 4 MG PO TABS: 4.0000 mg | ORAL_TABLET | Freq: Four times a day (QID) | ORAL | Status: DC | PRN

## 2020-02-07 MED ORDER — LACTATED RINGERS IV BOLUS
1000.0000 mL | Freq: Once | INTRAVENOUS | Status: AC
  Administered 2020-02-07: 23:00:00 1000 mL via INTRAVENOUS

## 2020-02-07 MED ORDER — METOPROLOL SUCCINATE ER 100 MG PO TB24
100.0000 mg | ORAL_TABLET | Freq: Two times a day (BID) | ORAL | Status: DC
  Administered 2020-02-07 – 2020-02-09 (×4): 100 mg via ORAL
  Filled 2020-02-07 (×3): qty 2
  Filled 2020-02-07: qty 1

## 2020-02-07 MED ORDER — PIPERACILLIN-TAZOBACTAM 3.375 G IVPB
3.3750 g | Freq: Three times a day (TID) | INTRAVENOUS | Status: AC
  Administered 2020-02-07 – 2020-02-16 (×27): 3.375 g via INTRAVENOUS
  Filled 2020-02-07 (×28): qty 50

## 2020-02-07 MED ORDER — CYCLOBENZAPRINE HCL 10 MG PO TABS
10.0000 mg | ORAL_TABLET | Freq: Three times a day (TID) | ORAL | Status: DC | PRN
  Administered 2020-02-08 – 2020-02-09 (×4): 10 mg via ORAL
  Filled 2020-02-07 (×4): qty 1

## 2020-02-07 MED ORDER — LACTATED RINGERS IV BOLUS
1000.0000 mL | Freq: Once | INTRAVENOUS | Status: AC
  Administered 2020-02-07: 18:00:00 1000 mL via INTRAVENOUS

## 2020-02-07 MED ORDER — DIAZEPAM 5 MG PO TABS: 5.0000 mg | ORAL_TABLET | Freq: Three times a day (TID) | ORAL | Status: DC | PRN

## 2020-02-07 MED ORDER — NITROGLYCERIN 0.4 MG SL SUBL: 0.4000 mg | SUBLINGUAL_TABLET | SUBLINGUAL | Status: DC | PRN

## 2020-02-07 MED ORDER — ISOSORBIDE MONONITRATE ER 30 MG PO TB24
30.0000 mg | ORAL_TABLET | Freq: Every day | ORAL | Status: DC
  Administered 2020-02-07 – 2020-02-08 (×2): 30 mg via ORAL
  Filled 2020-02-07 (×2): qty 1

## 2020-02-07 MED ORDER — INSULIN ASPART 100 UNIT/ML IV SOLN
10.0000 [IU] | Freq: Once | INTRAVENOUS | Status: AC
  Administered 2020-02-07: 10 [IU] via INTRAVENOUS
  Filled 2020-02-07: qty 0.1

## 2020-02-07 MED ORDER — IBUPROFEN 400 MG PO TABS
400.0000 mg | ORAL_TABLET | Freq: Once | ORAL | Status: AC
  Administered 2020-02-07: 23:00:00 400 mg via ORAL
  Filled 2020-02-07: qty 1

## 2020-02-07 MED ORDER — DILTIAZEM HCL ER COATED BEADS 240 MG PO CP24
240.0000 mg | ORAL_CAPSULE | Freq: Every day | ORAL | Status: DC
  Filled 2020-02-07: qty 1

## 2020-02-07 MED ORDER — SODIUM CHLORIDE 0.9 % IV SOLN
2.0000 g | Freq: Once | INTRAVENOUS | Status: AC
  Administered 2020-02-07: 20:00:00 2 g via INTRAVENOUS
  Filled 2020-02-07: qty 20

## 2020-02-07 MED ORDER — INSULIN ASPART 100 UNIT/ML ~~LOC~~ SOLN
0.0000 [IU] | SUBCUTANEOUS | Status: DC
  Administered 2020-02-08: 17:00:00 5 [IU] via SUBCUTANEOUS
  Administered 2020-02-08: 01:00:00 8 [IU] via SUBCUTANEOUS
  Administered 2020-02-08 (×2): 5 [IU] via SUBCUTANEOUS
  Filled 2020-02-07 (×5): qty 1

## 2020-02-07 MED ORDER — DEXTROSE 50 % IV SOLN
1.0000 | Freq: Once | INTRAVENOUS | Status: AC
  Administered 2020-02-07: 50 mL via INTRAVENOUS
  Filled 2020-02-07: qty 50

## 2020-02-07 NOTE — ED Provider Notes (Signed)
[facility] Emergency Department Provider Note   ____________________________________________   Event Date/Time   First MD Initiated Contact with Patient 02/07/20 1708     (approximate)  I have reviewed the triage vital signs and the nursing notes.   HISTORY  Chief Complaint Chest Pain    HPI Kenneth Osborn is a 64 y.o. male known history of coronary disease, COPD, prior cardiac stent  Patient reports for a couple days now he just has not felt well.  Reports he went to [facility] ER yesterday but sat in the waiting room and after being there for several hours decided to leave without being seen  He is continued to have discomfort pain over the left side of the chest.  Radiates some across his chest towards his arm.  Positive for fevers and chills.  Feels fatigued.  Reports he feels like he might have coronavirus though he denies known exposure  No associated abdominal pain except for generalized nausea.  Fatigue and weakness  Patient reports chronic back pain multiple back surgeries known noted change in his back pain  Pain right now is mild somewhat sharp left-sided  Seen at cardiology clinic brought to the ER for further evaluation    Past Medical History:  Diagnosis Date  . Acute postoperative pain 12/03/2018  . Allergic rhinitis 12/30/2012  . Anginal pain ([place])   . Bronchitis    hx of  . Can't get food down 08/12/2014  . Chronic back pain    thoracic area  . Concussion 09/2015  . COPD (chronic obstructive pulmonary disease) ([place])   . Coronary artery disease    99% blockage  . DDD (degenerative disc disease), cervical   . Dehydration symptoms    2019  . 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 Dysphagia   . GERD (gastroesophageal reflux disease)   . History of Meniere's disease 12/21/2014  . History of thoracic spine surgery (S/P T9-10 IVD spacer) 12/21/2014  . Hypercholesteremia   . Hyperlipidemia   .  Hypertension    sees Dr. Jenny Reichmann walker Jefm Bryant  . 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 Myocardial infarction [facility])    Sees Dr. Drema [place], [place] [facility]  . Neuromuscular disorder ([place])    diabetic neuropathy in feet  . Psychosis ([place])   . Short-segment Barrett's esophagus   . Sleep apnea    uses cpap and 2 L o2 at hs    Patient Active Problem List   Diagnosis Date Noted  . Acute acalculous cholecystitis 02/07/2020  . Severe sepsis ([place]) 02/07/2020  . Chronic low back pain (Bilateral) w/o sciatica 12/16/2019  . Hyperglycemia 12/16/2019  . Pharmacologic therapy 08/03/2019  . Disorder of skeletal system 08/03/2019  . Problems influencing health status 08/03/2019  . Postop check 12/17/2018  . Acute postoperative pain 12/03/2018  . DDD (degenerative disc disease), lumbosacral 09/21/2018  . Diabetic polyneuropathy associated with diabetes mellitus due to underlying condition ([place]) 09/10/2018  . Neuropathic pain 09/10/2018  . Musculoskeletal pain 09/10/2018  . AKI (acute kidney injury) ([place]) 02/19/2018  . Frequent falls 02/07/2018  . Hyperkalemia 01/18/2018  . Spondylosis without myelopathy or radiculopathy, lumbosacral region 11/13/2017  . Cardiac syncope 09/08/2017  . History of coronary artery disease 09/03/2017  . Spinal cord stimulator dysfunction ([place]) 12/26/2016  . Trigger point posterior superior iliac spine (PSIS) (Right) 12/25/2016  . 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 Polyneuropathy 12/18/2016  .  Numbness and tingling 11/12/2016  . Tachycardia with heart rate 100-120 beats per minute 11/12/2016  . Meniere disease, bilateral 11/12/2016  . Headache disorder 11/12/2016  . Type 2 diabetes mellitus with diabetic neuropathy ([place]) 11/12/2016  . Type 2 diabetes mellitus with diabetic polyneuropathy, with long-term current use of insulin ([place]) 11/12/2016  . Tremor 09/11/2016  . Chronic pain syndrome 05/28/2016  . Bilateral carotid  artery stenosis 01/24/2016  . Mild cognitive impairment 11/27/2015  . 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 Balance problem 10/09/2015  . Chronic tension-type headache, not intractable 10/09/2015  . Dizziness 10/09/2015  . Post-concussion headache 10/09/2015  . PSVT (paroxysmal supraventricular tachycardia) ([place]) 08/31/2015  . Diabetes mellitus, type II ([place]) 06/14/2015  . Unstable angina ([place]) 06/08/2015  . Encounter for interrogation of neurostimulator 03/22/2015  . [employer] spinal cord stimulator (implant date: 11/01/2014) 03/22/2015  . Type 2 diabetes mellitus with hyperglycemia ([place]) 03/15/2015  . Uncontrolled type 2 diabetes mellitus with hyperglycemia, with long-term current use of insulin ([place]) 03/15/2015  . DM type 2 with diabetic peripheral neuropathy ([place]) 03/15/2015  . Acid reflux 12/21/2014  . OSA (obstructive sleep apnea) 12/21/2014  . S/P insertion of spinal cord stimulator 12/21/2014  . Chronic low back pain (Bilateral) (L>R) w/ sciatica (Left) 12/21/2014  . 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 Beverely Pace at [facility] on 01/04/2013) 12/21/2014  . Long term current use of opiate analgesic 12/21/2014  . Long term prescription opiate use 12/21/2014  . Opiate use (60 MME/Day) 12/21/2014  . Opiate dependence ([place]) 12/21/2014  . Encounter for therapeutic drug level monitoring 12/21/2014  . Neurogenic pain 12/21/2014  . Thoracic facet syndrome (T8-10) 12/21/2014  . Lumbar facet syndrome (Bilateral) (R>L) 12/21/2014  . Cervical facet syndrome (Right) 12/21/2014  . Cervical spondylosis 12/21/2014  . Lumbar spondylosis 12/21/2014  . Chronic upper extremity pain (Left) 12/21/2014  . Chronic cervical radicular pain (Left) 12/21/2014  . Chronic upper back pain 12/21/2014  . History of thoracic spine surgery (S/P T9-10 IVD spacer) 12/21/2014  . 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 Chronic musculoskeletal pain 12/21/2014  .  Myofascial pain 12/21/2014  . Chronic lower extremity pain (Left) 12/21/2014  . Chronic radicular lumbar pain (left L4 dermatomal pain) 12/21/2014  . Coronary artery disease 12/21/2014  . History of MI (myocardial infarction) (May 2012) 12/21/2014  . Chronic anticoagulation ([place]) 12/21/2014  . History of Meniere's disease 12/21/2014  . Sleep apnea 12/21/2014  . History of spinal surgery 11/14/2014  . CKD (chronic kidney disease) stage 3, GFR 30-59 ml/min (HCC) 09/26/2014  . Hypotension due to hypovolemia 09/22/2014  . Benign essential HTN 09/14/2014  . Hypertension associated with diabetes ([place]) 09/14/2014  . Barrett esophagus 08/12/2014  . H/O adenomatous polyp of colon 08/12/2014  . Dysphagia 08/12/2014  . GERD (gastroesophageal reflux disease) 01/25/2014  . Arteriosclerosis of coronary artery 08/13/2013  . Hyperlipidemia 08/13/2013  . CAD (coronary artery disease) 08/13/2013  . Hyperlipidemia associated with type 2 diabetes mellitus ([place]) 08/13/2013  . Auditory vertigo 12/30/2012  . Cervical spinal stenosis 12/11/2012  . Arthralgia of shoulder 03/16/2012    Past Surgical History:  Procedure Laterality Date  . APPENDECTOMY    . ARTHRODESIS ANTERIOR ANTERIOR CERVICLE SPINE  01/04/2013  . BACK SURGERY     fusion thoracic area  . CARDIAC CATHETERIZATION     may 2012 and Nov 20, 2010  . CARDIAC CATHETERIZATION N/A 06/15/2015   Procedure: Left Heart Cath and Coronary Angiography;  Surgeon:  Corey Skains, MD;  Location: [place] CV LAB;  Service: Cardiovascular;  Laterality: N/A;  . CARDIAC CATHETERIZATION N/A 06/15/2015   Procedure: Coronary Stent Intervention;  Surgeon: Isaias Cowman, MD;  Location: [place] CV LAB;  Service: Cardiovascular;  Laterality: N/A;  . CARDIOVASCULAR STRESS TEST     jan 2014  . COLONOSCOPY WITH PROPOFOL N/A 09/19/2014   Procedure: COLONOSCOPY WITH PROPOFOL;  Surgeon: Manya Silvas, MD;  Location: [facility] ENDOSCOPY;  Service:  Endoscopy;  Laterality: N/A;  . CORONARY ANGIOPLASTY     stent placement  . ESOPHAGOGASTRODUODENOSCOPY N/A 09/19/2014   Procedure: ESOPHAGOGASTRODUODENOSCOPY (EGD);  Surgeon: Manya Silvas, MD;  Location: [facility] ENDOSCOPY;  Service: Endoscopy;  Laterality: N/A;  . [place] right ear  . LUMBAR SPINAL CORD SIMULATOR LEAD REMOVAL Right 08/09/2019   Procedure: REMOVAL SPINAL CORD STIMULATOR PERCUTANEOUS LEADS, REMOVAL PULSE GENERATOR;  Surgeon: [name], MD;  Location: [facility] ORS;  Service: Neurosurgery;  Laterality: Right;  LOCAL WITH MAC  . mastoid shunt     left, 2002, 1997 right ear, 1980 right ear  . PULSE GENERATOR IMPLANT N/A 01/18/2019   Procedure: [employer] SPINAL CORD STIMULATOR BATTERY EXCHANGE;  Surgeon: [name], MD;  Location: [facility] ORS;  Service: Neurosurgery;  Laterality: N/A;  . SAVORY DILATION N/A 09/19/2014   Procedure: SAVORY DILATION;  Surgeon: Manya Silvas, MD;  Location: [facility] ENDOSCOPY;  Service: Endoscopy;  Laterality: N/A;  . SHOULDER ARTHROSCOPY WITH SUBACROMIAL DECOMPRESSION Left 04/06/2012   Procedure: SHOULDER ARTHROSCOPY WITH SUBACROMIAL DECOMPRESSION;  Surgeon: Vickey Huger, MD;  Location: [place];  Service: Orthopedics;  Laterality: Left;  left shoulder arthroscopy, subacromial decompression and distal clavicle resection  . SPINAL CORD STIMULATOR IMPLANT Right     Prior to Admission medications   Medication Sig Start Date End Date Taking? Authorizing Provider  ALPRAZolam Duanne Moron) 0.5 MG tablet Take 0.5 mg by mouth at bedtime.   Yes [provider]  cetirizine (ZYRTEC) 10 MG tablet Take 10 mg by mouth daily.    Yes [provider]  cyclobenzaprine (FLEXERIL) 10 MG tablet Take 1 tablet (10 mg total) by mouth 3 (three) times daily as needed for muscle spasms. 12/21/19 03/20/20 Yes Milinda Pointer, MD  diazepam (VALIUM) 5 MG tablet Take 5 mg by mouth 3 (three) times daily as needed (Meniere's disease).    Yes [provider]   diltiazem (CARDIZEM CD) 240 MG 24 hr capsule Take 240 mg by mouth at bedtime.  03/30/18  Yes [provider]  enalapril (VASOTEC) 10 MG tablet Take 10 mg by mouth 2 (two) times daily.  07/28/18  Yes [provider]  esomeprazole (NEXIUM) 40 MG capsule Take 40 mg by mouth 2 (two) times daily.   Yes [provider]  ezetimibe (ZETIA) 10 MG tablet Take 10 mg by mouth at bedtime.  03/26/17  Yes [provider]  gabapentin (NEURONTIN) 800 MG tablet Take 2 tablets (1,600 mg total) by mouth 2 (two) times daily. 12/21/19 03/20/20 Yes Milinda Pointer, MD  HUMULIN R U-500 KWIKPEN 500 UNIT/ML kwikpen Inject 40 Units into the skin 3 (three) times daily with meals. Patient taking differently: Inject 70-80 Units into the skin See admin instructions. Can adjust according to blood sugar Inject 80 units with breakfast, 80 units with lunch, 70 units with dinner 01/19/18  Yes Max Sane, MD  isosorbide mononitrate (IMDUR) 30 MG 24 hr tablet Take 30 mg by mouth at bedtime.   Yes [provider]  Magnesium  Oxide 500 MG TABS Take 1 tablet (500 mg total) by mouth daily. 12/21/19 03/20/20 Yes Milinda Pointer, MD  meclizine (ANTIVERT) 25 MG tablet Take 25 mg by mouth 3 (three) times daily as needed for dizziness.  07/06/15  Yes [provider]  metFORMIN (GLUCOPHAGE) 1000 MG tablet Take 1,000 mg by mouth 2 (two) times daily with a meal. 03/06/18  Yes [provider]  metoprolol succinate (TOPROL-XL) 100 MG 24 hr tablet Take 100 mg by mouth 2 (two) times daily. Take with or immediately following a meal.   Yes [provider]  montelukast (SINGULAIR) 10 MG tablet Take 10 mg by mouth at bedtime.    Yes [provider]  niacin (NIASPAN) 500 MG CR tablet Take 500 mg by mouth at bedtime.   Yes [provider]  Omega-3 Fatty Acids (FISH OIL) 1200 MG CAPS Take 1,200 mg by mouth 2 (two) times daily.   Yes [provider]  Oxycodone HCl 10  MG TABS Take 1 tablet (10 mg total) by mouth every 6 (six) hours as needed. Must last 30 days 01/21/20 02/20/20 Yes Milinda Pointer, MD  prasugrel (EFFIENT) 10 MG TABS tablet Take 10 mg by mouth daily. 11/22/19  Yes [provider]  rosuvastatin (CRESTOR) 40 MG tablet Take 40 mg by mouth at bedtime.   Yes [provider]  Glucagon (BAQSIMI ONE PACK NA) Place 1 Dose into the nose daily as needed (severely low blood sugar).    [provider]  HYDROcodone-acetaminophen (NORCO/VICODIN) 5-325 MG tablet Take 1 tablet by mouth 3 (three) times daily as needed. 01/28/20   [provider]  Magnesium 400 MG TABS Take 400 mg by mouth daily.     [provider]  naloxone [facility]) nasal spray 4 mg/0.1 mL Place 1 spray into the nose daily as needed (opioid overdose).    [provider]  naloxone [facility]) nasal spray 4 mg/0.1 mL For overdose/increased sleepiness. Call 911 if you need to use this medication 08/09/19   Zdeb, Altha Harm, NP  nitroGLYCERIN (NITROSTAT) 0.4 MG SL tablet Place 0.4 mg under the tongue every 5 (five) minutes as needed for chest pain.    [provider]    Allergies Patient has no known allergies.  Family History  Problem Relation Age of Onset  . Heart disease Mother   . Diabetes Mother   . Heart disease Father   . Cancer Sister   . Heart disease Maternal Aunt   . Heart disease Maternal Uncle   . Diabetes Maternal Grandmother   . Diabetes Paternal Grandmother     Social History Social History   Tobacco Use  . Smoking status: Never Smoker  . Smokeless tobacco: Never Used  Vaping Use  . Vaping Use: Never used  Substance Use Topics  . Alcohol use: No  . Drug use: No    Review of Systems Constitutional: Fevers and fatigue eyes: No visual changes. ENT: No sore throat. Cardiovascular: See HPI Respiratory: Mild feeling of shortness of breath. Gastrointestinal: No abdominal pain some nausea.   Genitourinary:  Negative for dysuria. Musculoskeletal: Negative for back pain aside from chronic discomfort. Skin: Negative for rash. Neurological: Negative for headaches, areas of focal weakness or numbness.    ____________________________________________   PHYSICAL EXAM:  VITAL SIGNS: ED Triage Vitals  Enc Vitals Group     BP 02/07/20 1654 124/74     Pulse Rate 02/07/20 1654 (!) 121     Resp 02/07/20 1654 20  Temp 02/07/20 1654 99.8 F (37.7 C)     Temp Source 02/07/20 1654 Oral     SpO2 02/07/20 1654 95 %     Weight 02/07/20 1645 225 lb (102.1 kg)     Height 02/07/20 1645 _0  (1.753 m)     Head Circumference --      Peak Flow --      Pain Score 02/07/20 1645 8     Pain Loc --      Pain Edu? --      Excl. in [place]? --     Constitutional: Alert and oriented to self and place but reports that is roughly the second or third day of December.  He does seem mildly confused at times. Eyes: Conjunctivae are normal. Head: Atraumatic.  Some erythema across the face upper torso Nose: No congestion/rhinnorhea. Mouth/Throat: Mucous membranes are dry. Neck: No stridor.  Cardiovascular: Mildly tachycardic rate, regular rhythm. Grossly normal heart sounds.  Good peripheral circulation. Respiratory: Normal respiratory effort.  No retractions. Lungs CTAB. Gastrointestinal: Soft and nontender. No distention. Musculoskeletal: No lower extremity tenderness nor edema. Neurologic:  Normal speech and language. No gross focal neurologic deficits are appreciated.  Skin:  Skin is warm, dry and intact. No rash noted. Psychiatric: Mood and affect are normal. Speech and behavior are normal.  ____________________________________________   LABS (all labs ordered are listed, but only abnormal results are displayed)  Labs Reviewed  BASIC METABOLIC PANEL - Abnormal; Notable for the following components:      Result Value   Sodium 133 (*)    Potassium 5.9 (*)    CO2 20 (*)    Glucose, Bld 240 (*)    BUN 35  (*)    Creatinine, Ser 1.84 (*)    GFR, Estimated 40 (*)    All other components within normal limits  CBC - Abnormal; Notable for the following components:   WBC 10.9 (*)    All other components within normal limits  LACTIC ACID, PLASMA - Abnormal; Notable for the following components:   Lactic Acid, Venous 3.1 (*)    All other components within normal limits  URINALYSIS, COMPLETE (UACMP) WITH MICROSCOPIC - Abnormal; Notable for the following components:   Color, Urine AMBER (*)    APPearance HAZY (*)    Glucose, UA 50 (*)    Hgb urine dipstick SMALL (*)    Bilirubin Urine SMALL (*)    Protein, ur 100 (*)    Bacteria, UA RARE (*)    Non Squamous Epithelial PRESENT (*)    All other components within normal limits  HEPATIC FUNCTION PANEL - Abnormal; Notable for the following components:   AST 901 (*)    ALT 1,275 (*)    Alkaline Phosphatase 150 (*)    Total Bilirubin 5.0 (*)    Bilirubin, Direct 3.6 (*)    Indirect Bilirubin 1.4 (*)    All other components within normal limits  RESP PANEL BY RT-PCR (FLU A&B, COVID) ARPGX2  CULTURE, BLOOD (ROUTINE X 2)  CULTURE, BLOOD (ROUTINE X 2)  URINE CULTURE  LIPASE, BLOOD  AMMONIA  TROPONIN I (HIGH SENSITIVITY)  TROPONIN I (HIGH SENSITIVITY)   ____________________________________________  EKG  Reviewed inter by me at 1640 Heart rate 120 QRS 90 QTc 430 Sinus tachycardia, old septal Q wave.  No obvious acute ischemia though there is slight ST abnormality noted in 1 and 2, non specific ____________________________________________  RADIOLOGY  DG Chest 1 View  Result Date: 02/07/2020 CLINICAL DATA:  Shortness of breath and chest pain. EXAM: CHEST  1 VIEW COMPARISON:  02/06/2020 FINDINGS: Stable cardiomediastinal contours. The lung volumes are low. No pleural effusion or edema. No airspace densities identified. Review of the visualized osseous structures is unremarkable. IMPRESSION: 1. Lungs are suboptimally inflated but clear.  Electronically Signed   By: Kerby Moors M.D.   On: 02/07/2020 17:49   CT Angio Chest PE W and/or Wo Contrast  Result Date: 02/07/2020 CLINICAL DATA:  64 year old male with abdominal pain and fever. EXAM: CT ANGIOGRAPHY CHEST CT ABDOMEN AND PELVIS WITH CONTRAST TECHNIQUE: Multidetector CT imaging of the chest was performed using the standard protocol during bolus administration of intravenous contrast. Multiplanar CT image reconstructions and MIPs were obtained to evaluate the vascular anatomy. Multidetector CT imaging of the abdomen and pelvis was performed using the standard protocol during bolus administration of intravenous contrast. CONTRAST:  28m OMNIPAQUE IOHEXOL 350 MG/ML SOLN COMPARISON:  Chest radiograph dated 02/07/2020 and CT of the abdomen pelvis dated 05/07/2007. FINDINGS: CTA CHEST FINDINGS Cardiovascular: Top-normal cardiac size. No pericardial effusion. Three-vessel coronary vascular calcification. The thoracic aorta is unremarkable. The origins of the great vessels of the aortic arch appear patent. Evaluation of the pulmonary arteries is somewhat limited due to respiratory motion artifact and suboptimal visualization of the peripheral branches. No central pulmonary artery embolus identified. Mediastinum/Nodes: No hilar or mediastinal adenopathy. The esophagus and the thyroid gland are grossly unremarkable. No mediastinal fluid collection. Lungs/Pleura: There are bibasilar subpleural atelectasis/scarring. No focal consolidation, pleural effusion, pneumothorax. The central airways are patent. Musculoskeletal: T8-T9 disc spacer. No acute osseous pathology. Review of the MIP images confirms the above findings. CT ABDOMEN and PELVIS FINDINGS No intra-abdominal free air or free fluid. Hepatobiliary: Fatty liver. No intrahepatic biliary dilatation. There is mild haziness of the gallbladder wall and pericholecystic fat. No calcified gallstone. Ultrasound may provide better evaluation of the  gallbladder if there is clinical concern for acute cholecystitis. Pancreas: Unremarkable. No pancreatic ductal dilatation or surrounding inflammatory changes. Spleen: Normal in size without focal abnormality. Adrenals/Urinary Tract: The adrenal glands unremarkable. There is a 5.6 x 5.9 cm heterogeneously enhancing mass in the upper pole of the left kidney most concerning for malignancy. Further characterization with renal mass protocol MRI is recommended. Additional 2 cm hypodense lesion in the upper pole of the left kidney laterally is not characterized but possibly a cyst. There is no hydronephrosis on either side. There is symmetric enhancement and excretion of contrast by both kidneys. The visualized ureters and urinary bladder appear unremarkable. Stomach/Bowel: There is no bowel obstruction or active inflammation. Appendectomy. Vascular/Lymphatic: Mild aortoiliac atherosclerotic disease. The IVC is unremarkable. No portal venous gas. There is no adenopathy. Reproductive: The prostate and seminal vesicles are grossly unremarkable. No pelvic mass. Other: None Musculoskeletal: No acute or significant osseous findings. Review of the MIP images confirms the above findings. IMPRESSION: 1. No acute intrathoracic pathology. No CT evidence of central pulmonary artery embolus. 2. Mild haziness of the gallbladder wall and pericholecystic fat. No calcified gallstone. Ultrasound may provide better evaluation of the gallbladder if there is clinical concern for acute cholecystitis. 3. A 5.6 x 5.9 cm heterogeneously enhancing mass in the upper pole of the left kidney most concerning for malignancy. Further characterization with renal mass protocol MRI is recommended. 4. Fatty liver. 5. No bowel obstruction. 6. Aortic Atherosclerosis (ICD10-I70.0). Electronically Signed   By: AAnner CreteM.D.   On: 02/07/2020 20:37   CT ABDOMEN PELVIS W CONTRAST  Result Date: 02/07/2020 CLINICAL  DATA:  64 year old male with abdominal  pain and fever. EXAM: CT ANGIOGRAPHY CHEST CT ABDOMEN AND PELVIS WITH CONTRAST TECHNIQUE: Multidetector CT imaging of the chest was performed using the standard protocol during bolus administration of intravenous contrast. Multiplanar CT image reconstructions and MIPs were obtained to evaluate the vascular anatomy. Multidetector CT imaging of the abdomen and pelvis was performed using the standard protocol during bolus administration of intravenous contrast. CONTRAST:  75m OMNIPAQUE IOHEXOL 350 MG/ML SOLN COMPARISON:  Chest radiograph dated 02/07/2020 and CT of the abdomen pelvis dated 05/07/2007. FINDINGS: CTA CHEST FINDINGS Cardiovascular: Top-normal cardiac size. No pericardial effusion. Three-vessel coronary vascular calcification. The thoracic aorta is unremarkable. The origins of the great vessels of the aortic arch appear patent. Evaluation of the pulmonary arteries is somewhat limited due to respiratory motion artifact and suboptimal visualization of the peripheral branches. No central pulmonary artery embolus identified. Mediastinum/Nodes: No hilar or mediastinal adenopathy. The esophagus and the thyroid gland are grossly unremarkable. No mediastinal fluid collection. Lungs/Pleura: There are bibasilar subpleural atelectasis/scarring. No focal consolidation, pleural effusion, pneumothorax. The central airways are patent. Musculoskeletal: T8-T9 disc spacer. No acute osseous pathology. Review of the MIP images confirms the above findings. CT ABDOMEN and PELVIS FINDINGS No intra-abdominal free air or free fluid. Hepatobiliary: Fatty liver. No intrahepatic biliary dilatation. There is mild haziness of the gallbladder wall and pericholecystic fat. No calcified gallstone. Ultrasound may provide better evaluation of the gallbladder if there is clinical concern for acute cholecystitis. Pancreas: Unremarkable. No pancreatic ductal dilatation or surrounding inflammatory changes. Spleen: Normal in size without focal  abnormality. Adrenals/Urinary Tract: The adrenal glands unremarkable. There is a 5.6 x 5.9 cm heterogeneously enhancing mass in the upper pole of the left kidney most concerning for malignancy. Further characterization with renal mass protocol MRI is recommended. Additional 2 cm hypodense lesion in the upper pole of the left kidney laterally is not characterized but possibly a cyst. There is no hydronephrosis on either side. There is symmetric enhancement and excretion of contrast by both kidneys. The visualized ureters and urinary bladder appear unremarkable. Stomach/Bowel: There is no bowel obstruction or active inflammation. Appendectomy. Vascular/Lymphatic: Mild aortoiliac atherosclerotic disease. The IVC is unremarkable. No portal venous gas. There is no adenopathy. Reproductive: The prostate and seminal vesicles are grossly unremarkable. No pelvic mass. Other: None Musculoskeletal: No acute or significant osseous findings. Review of the MIP images confirms the above findings. IMPRESSION: 1. No acute intrathoracic pathology. No CT evidence of central pulmonary artery embolus. 2. Mild haziness of the gallbladder wall and pericholecystic fat. No calcified gallstone. Ultrasound may provide better evaluation of the gallbladder if there is clinical concern for acute cholecystitis. 3. A 5.6 x 5.9 cm heterogeneously enhancing mass in the upper pole of the left kidney most concerning for malignancy. Further characterization with renal mass protocol MRI is recommended. 4. Fatty liver. 5. No bowel obstruction. 6. Aortic Atherosclerosis (ICD10-I70.0). Electronically Signed   By: AAnner CreteM.D.   On: 02/07/2020 20:37   [place] LIMITED RUQ (LIVER/GB)  Result Date: 02/07/2020 CLINICAL DATA:  Right upper quadrant pain x2 days. EXAM: ULTRASOUND ABDOMEN LIMITED RIGHT UPPER QUADRANT COMPARISON:  None. FINDINGS: Gallbladder: Echogenic sludge is seen within the gallbladder lumen, without evidence of gallstones.  Gallbladder wall thickening is noted (3.3 mm). A trace amount of pericholecystic fluid is also seen. No sonographic Murphy sign noted by sonographer. Common bile duct: Diameter: 3.6 mm Liver: No focal lesion identified. There is diffusely increased echogenicity of the liver parenchyma. Portal  vein is patent on color Doppler imaging with normal direction of blood flow towards the liver. Other: None. IMPRESSION: Gallbladder sludge with additional findings that may represent sequelae associated with acalculous cholecystitis. Further evaluation with a nuclear medicine hepatobiliary scan is recommended. Electronically Signed   By: Virgina Norfolk M.D.   On: 02/07/2020 21:46    Imaging reviewed, no acute pulmonary pathology.  A concerning lesion is noted on his left kidney.  Additionally, the patient has haziness of the gallbladder wall and pericholecystic fat.  No calcified gallstone is seen   Discussed the above imaging findings with Dr. Francine Graven, hospitalist will be admitting for further work-up.  LFTs added demonstrate transaminitis, and it is notable the patient is not complaining of abdominal tenderness to exam or right upper quadrant pain ____________________________________________   PROCEDURES  Procedure(s) performed: None  Procedures  Critical Care performed: No  ____________________________________________   INITIAL IMPRESSION / ASSESSMENT AND PLAN / ED COURSE  Pertinent labs & imaging results that were available during my care of the patient were reviewed by me and considered in my medical decision making (see chart for details).   Patient presents for fatigue, tachycardia and left-sided sharp chest pain.  Imaging here reassuring for no evidence of acute pulmonary pathology or PE.  He is however noted to be febrile, Covid test negative.  He also has slight confusion I am concerned about associated metabolic encephalopathy.  He denies headache no meningismus no neck pain, do not see  evidence of encephalitis or meningitis.  Because of the patient's elevated white count fever tachycardia history of diabetes I do feel there is a high risk for possible bacteremia of an unknown source.  I have dosed him with Rocephin as we continue his work-up.  Discussed case and care with Dr. Francine Graven.  Also discussed with the patient who is agreeable and understanding of plan for admission.  Clinical Course as of 02/07/20 2225  Mon Feb 07, 2020  1710 Records reviewed, have paged Dr. [name].  In addition, it appears the patient had a visit to a ER through [facility] yesterday but I am not able to see the physician note  [MQ]  [place] Communicated with Dr. [name], cardiology team advised patient had normal troponins and no EKG changes when seen at the [facility] ER.  This was just yesterday.  He was referred here as they noticed he seemed of altered mental status and tachycardia at the clinic today [MQ]  2213 LFTs of resulted concerning for significant transaminitis also elevated bilirubin.  In this context along with imaging a calculus cholecystitis is high on the differential, oddly the patient does not appear to have right upper quadrant pain however given his findings I certainly have concern for this. Updated Dr. Francine Graven regarding findings (admitting) [MQ]    Clinical Course User Index [MQ] Delman Kitten, MD   ----------------------------------------- 10:36 PM on 02/07/2020 -----------------------------------------  Additional, transaminases revealed elevation in right upper quadrant concerning for possible acalculous cholecystitis.  I have ordered broadening of antibiotics, and have discussed with Dr. Marius Ditch and Dr. Milas Gain will both provide consultation.  General surgery advising does not appear to be surgical at this moment, but will see in consult.  GI advising no noted evidence to suspect a high concern for choledocholithiasis at this time.  Discussed with the patient, he is  understanding of plan for admission.  He does meet criteria for sepsis but does not demonstrate hypotension or lactic acid greater than 4 at this time.  Reassessment he remains mildly tachycardic and slightly tachypneic.  He is awake and alert and better oriented at this time.  On abdominal exam he has no tenderness noted in the right upper quadrant and a negative Murphy at this time.  Patient also tells me he has not noticed abdominal pain.  However there remains significant concern for possible acalculous cholecystitis or other causes of sepsis for which she will be admitted and further work-up including consultations with GI and general surgery are called and confirmed with service  ____________________________________________   FINAL CLINICAL IMPRESSION(S) / ED DIAGNOSES  Final diagnoses:  SIRS (systemic inflammatory response syndrome) ([place])  Renal mass  Metabolic encephalopathy  Abdominal pain        Note:  This document was prepared using Dragon voice recognition software and may include unintentional dictation errors       Delman Kitten, MD 02/07/20 2237

## 2020-02-07 NOTE — Progress Notes (Signed)
64 yo obese male with DMII, presenting with LUQ pain, recently ruled out for ACS, now presenting with fever, and sepsis.  LFTs with marked elevation of transaminases, and T. Bili of 5.  U/s of GB w/o stones or CBD dilation.  Some stranding noted on GB on CT.   Very likely source of sepsis.  Advise initiation of broad spectrum Abx, concur with Zosyn.  Keep NPO.  IR evaluation for percutaneous cholecystostomy after confirming with HIDA tomorrow.    Full consult to follow.   Ronny Bacon, M.D., [facility]  02/07/2020 ; 10:48 PM

## 2020-02-07 NOTE — H&P (Addendum)
History and Physical    Kenneth Osborn GNF:[id number] DOB: [birth year] DOA: 02/07/2020  PCP: [name], MD   Patient coming from: Home  I have personally briefly reviewed patient's old medical records in [place]  Chief Complaint: Chest pain  HPI: Kenneth Osborn is a 64 y.o. 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  Pain is mostly across his chest with radiation to the right arm associated with fever, chills and fatigue.  He complains of nausea but denies having any abdominal pain.  Patient felt that he may have infection with coronavirus but denies having any known exposure.  He denies having any diaphoresis, no palpitations, no shortness of breath but he has felt dizzy and lightheaded. He denies having any diarrhea, no constipation, no melena stools, no hematochezia, no urinary frequency, no nocturia, no cough, no headache. He has chronic low back pain but has had multiple back surgeries and states that there has not been a change in the intensity of his pain. In the ER he was noted to have a fever with a T-max of 101.4 F, he was tachycardic and tachypneic as well.  Labs show sodium 133, calcium 9.9, chloride 99, bicarbonate 20, glucose 240, BUN 35, creatinine 1.84, calcium 10, anion gap 15, alkaline phosphatase 115, albumin 4.2, lipase 22, AST 901, ALT 1275, total protein 7.6, bilirubin 3.6 troponin 8, lactic acid 3.1, white count 10.9, hemoglobin 15.9 hematocrit 47.3, MCV 82, RDW 13.8, platelet count 212, PT 11.9, INR 0.9 Respiratory viral panel is negative Gallbladder ultrasound shows gallbladder sludge with findings that may represent sequelae of acalculus cholecystitis CT scan of abdomen and pelvis showed mild haziness of  the gallbladder wall and pericholecystic fat. No calcified gallstones. Ultrasound may provide better evaluation of the gallbladder if there is a concern for acute cholecystitis. 5.6 x 5.9 heterogeneously enhancing mass in the upper pole of the left kidney most concerning for malignancy. CT angiogram of the chest is negative for pulmonary embolism Chest x-ray reviewed by me shows clear lungs Twelve-lead EKG reviewed by me shows sinus tachycardia with a left posterior fascicular block    ED Course: Patient is a 64 year old patient male who presents to the emergency room for evaluation of chest pain was noted to be febrile, tachycardic and tachypneic with an elevated lactic acid level. White count is slightly elevated at 10.9 and source of sepsis appears to be secondary to acute acalculous cholecystitis.  He will be admitted to the hospital for further evaluation.     Review of Systems: As per HPI otherwise all systems reviewed and negative.    Past Medical History:  Diagnosis Date  . Acute postoperative pain 12/03/2018  . Allergic rhinitis 12/30/2012  . Anginal pain ([place])   . Bronchitis    hx of  . Can't get food down 08/12/2014  . Chronic back pain    thoracic area  . Concussion 09/2015  . COPD (chronic obstructive pulmonary disease) ([place])   . Coronary artery disease    99% blockage  . DDD (degenerative disc disease), cervical   . Dehydration symptoms    2019  . 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 Dysphagia   . GERD (gastroesophageal reflux disease)   . History of Meniere's disease 12/21/2014  .  History of thoracic spine surgery (S/P T9-10 IVD spacer) 12/21/2014  . Hypercholesteremia   . Hyperlipidemia   . Hypertension    sees Dr. Jenny Reichmann walker Jefm Bryant  . 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 Myocardial infarction [facility])    Sees Dr. Drema Dallas, [place] [facility]  . Neuromuscular disorder ([place])    diabetic neuropathy in feet  .  Psychosis ([place])   . Short-segment Barrett's esophagus   . Sleep apnea    uses cpap and 2 L o2 at hs    Past Surgical History:  Procedure Laterality Date  . APPENDECTOMY    . ARTHRODESIS ANTERIOR ANTERIOR CERVICLE SPINE  01/04/2013  . BACK SURGERY     fusion thoracic area  . CARDIAC CATHETERIZATION     may 2012 and Nov 20, 2010  . CARDIAC CATHETERIZATION N/A 06/15/2015   Procedure: Left Heart Cath and Coronary Angiography;  Surgeon: Corey Skains, MD;  Location: [place] CV LAB;  Service: Cardiovascular;  Laterality: N/A;  . CARDIAC CATHETERIZATION N/A 06/15/2015   Procedure: Coronary Stent Intervention;  Surgeon: Isaias Cowman, MD;  Location: [place] CV LAB;  Service: Cardiovascular;  Laterality: N/A;  . CARDIOVASCULAR STRESS TEST     jan 2014  . COLONOSCOPY WITH PROPOFOL N/A 09/19/2014   Procedure: COLONOSCOPY WITH PROPOFOL;  Surgeon: Manya Silvas, MD;  Location: [facility] ENDOSCOPY;  Service: Endoscopy;  Laterality: N/A;  . CORONARY ANGIOPLASTY     stent placement  . ESOPHAGOGASTRODUODENOSCOPY N/A 09/19/2014   Procedure: ESOPHAGOGASTRODUODENOSCOPY (EGD);  Surgeon: Manya Silvas, MD;  Location: [facility] ENDOSCOPY;  Service: Endoscopy;  Laterality: N/A;  . Rew right ear  . LUMBAR SPINAL CORD SIMULATOR LEAD REMOVAL Right 08/09/2019   Procedure: REMOVAL SPINAL CORD STIMULATOR PERCUTANEOUS LEADS, REMOVAL PULSE GENERATOR;  Surgeon: [name], MD;  Location: [facility] ORS;  Service: Neurosurgery;  Laterality: Right;  LOCAL WITH MAC  . mastoid shunt     left, 2002, 1997 right ear, 1980 right ear  . PULSE GENERATOR IMPLANT N/A 01/18/2019   Procedure: MEDTRONIC SPINAL CORD STIMULATOR BATTERY EXCHANGE;  Surgeon: [name], MD;  Location: [facility] ORS;  Service: Neurosurgery;  Laterality: N/A;  . SAVORY DILATION N/A 09/19/2014   Procedure: SAVORY DILATION;  Surgeon: Manya Silvas, MD;  Location: [facility] ENDOSCOPY;  Service: Endoscopy;  Laterality: N/A;  . SHOULDER  ARTHROSCOPY WITH SUBACROMIAL DECOMPRESSION Left 04/06/2012   Procedure: SHOULDER ARTHROSCOPY WITH SUBACROMIAL DECOMPRESSION;  Surgeon: Vickey Huger, MD;  Location: [place];  Service: Orthopedics;  Laterality: Left;  left shoulder arthroscopy, subacromial decompression and distal clavicle resection  . SPINAL CORD STIMULATOR IMPLANT Right      reports that he has never smoked. He has never used smokeless tobacco. He reports that he does not drink alcohol and does not use drugs.  No Known Allergies  Family History  Problem Relation Age of Onset  . Heart disease Mother   . Diabetes Mother   . Heart disease Father   . Cancer Sister   . Heart disease Maternal Aunt   . Heart disease Maternal Uncle   . Diabetes Maternal Grandmother   . Diabetes Paternal Grandmother      Prior to Admission medications   Medication Sig Start Date End Date Taking? Authorizing Provider  ALPRAZolam Duanne Moron) 0.5 MG tablet Take 0.5 mg by mouth at bedtime.   Yes [provider]  cetirizine (ZYRTEC) 10 MG tablet Take 10 mg by mouth daily.  Yes [provider]  cyclobenzaprine (FLEXERIL) 10 MG tablet Take 1 tablet (10 mg total) by mouth 3 (three) times daily as needed for muscle spasms. 12/21/19 03/20/20 Yes Milinda Pointer, MD  diazepam (VALIUM) 5 MG tablet Take 5 mg by mouth 3 (three) times daily as needed (Meniere's disease).    Yes [provider]  diltiazem (CARDIZEM CD) 240 MG 24 hr capsule Take 240 mg by mouth at bedtime.  03/30/18  Yes [provider]  enalapril (VASOTEC) 10 MG tablet Take 10 mg by mouth 2 (two) times daily.  07/28/18  Yes [provider]  esomeprazole (NEXIUM) 40 MG capsule Take 40 mg by mouth 2 (two) times daily.   Yes [provider]  ezetimibe (ZETIA) 10 MG tablet Take 10 mg by mouth at bedtime.  03/26/17  Yes [provider]  gabapentin (NEURONTIN) 800 MG tablet Take 2 tablets (1,600 mg total) by mouth 2 (two) times daily. 12/21/19  03/20/20 Yes Milinda Pointer, MD  HUMULIN R U-500 KWIKPEN 500 UNIT/ML kwikpen Inject 40 Units into the skin 3 (three) times daily with meals. Patient taking differently: Inject 70-80 Units into the skin See admin instructions. Can adjust according to blood sugar Inject 80 units with breakfast, 80 units with lunch, 70 units with dinner 01/19/18  Yes Max Sane, MD  isosorbide mononitrate (IMDUR) 30 MG 24 hr tablet Take 30 mg by mouth at bedtime.   Yes [provider]  Magnesium Oxide 500 MG TABS Take 1 tablet (500 mg total) by mouth daily. 12/21/19 03/20/20 Yes Milinda Pointer, MD  meclizine (ANTIVERT) 25 MG tablet Take 25 mg by mouth 3 (three) times daily as needed for dizziness.  07/06/15  Yes [provider]  metFORMIN (GLUCOPHAGE) 1000 MG tablet Take 1,000 mg by mouth 2 (two) times daily with a meal. 03/06/18  Yes [provider]  metoprolol succinate (TOPROL-XL) 100 MG 24 hr tablet Take 100 mg by mouth 2 (two) times daily. Take with or immediately following a meal.   Yes [provider]  montelukast (SINGULAIR) 10 MG tablet Take 10 mg by mouth at bedtime.    Yes [provider]  niacin (NIASPAN) 500 MG CR tablet Take 500 mg by mouth at bedtime.   Yes [provider]  Omega-3 Fatty Acids (FISH OIL) 1200 MG CAPS Take 1,200 mg by mouth 2 (two) times daily.   Yes [provider]  Oxycodone HCl 10 MG TABS Take 1 tablet (10 mg total) by mouth every 6 (six) hours as needed. Must last 30 days 01/21/20 02/20/20 Yes Milinda Pointer, MD  prasugrel (EFFIENT) 10 MG TABS tablet Take 10 mg by mouth daily. 11/22/19  Yes [provider]  rosuvastatin (CRESTOR) 40 MG tablet Take 40 mg by mouth at bedtime.   Yes [provider]  Glucagon (BAQSIMI ONE PACK NA) Place 1 Dose into the nose daily as needed (severely low blood sugar).    [provider]  HYDROcodone-acetaminophen (NORCO/VICODIN) 5-325 MG tablet Take 1 tablet by  mouth 3 (three) times daily as needed. 01/28/20   [provider]  Magnesium 400 MG TABS Take 400 mg by mouth daily.     [provider]  naloxone [facility]) nasal spray 4 mg/0.1 mL Place 1 spray into the nose daily as needed (opioid overdose).    [provider]  naloxone [facility]) nasal spray 4 mg/0.1 mL For overdose/increased sleepiness. Call 911 if you need to use this medication 08/09/19   Lonell Face,  NP  nitroGLYCERIN (NITROSTAT) 0.4 MG SL tablet Place 0.4 mg under the tongue every 5 (five) minutes as needed for chest pain.    [provider]    Physical Exam: Vitals:   02/07/20 1941 02/07/20 2100 02/07/20 2130 02/07/20 2200  BP: 132/62 121/70 (!) 132/92 (!) 140/57  Pulse: (!) 113 (!) 115 (!) 118 (!) 116  Resp: (!) 31 15 (!) 28 (!) 36  Temp:      TempSrc:      SpO2: 94% 95% 90% 93%  Weight:      Height:         Vitals:   02/07/20 1941 02/07/20 2100 02/07/20 2130 02/07/20 2200  BP: 132/62 121/70 (!) 132/92 (!) 140/57  Pulse: (!) 113 (!) 115 (!) 118 (!) 116  Resp: (!) 31 15 (!) 28 (!) 36  Temp:      TempSrc:      SpO2: 94% 95% 90% 93%  Weight:      Height:        Constitutional: NAD, alert and oriented x 3.  Appears acutely ill.  Face is flushed and warm to touch Eyes: PERRL, lids and conjunctivae normal ENMT: Mucous membranes are dry.  Neck: normal, supple, no masses, no thyromegaly Respiratory: clear to auscultation bilaterally, no wheezing, no crackles. Normal respiratory effort. No accessory muscle use.  Cardiovascular: Tachycardic, no murmurs / rubs / gallops. No extremity edema. 2+ pedal pulses. No carotid bruits.  Abdomen: no tenderness, no masses palpated. No hepatosplenomegaly. Bowel sounds positive.  Musculoskeletal: no clubbing / cyanosis. No joint deformity upper and lower extremities.  Skin: no rashes, lesions, ulcers.  Neurologic: No gross focal neurologic deficit.  Generalized weakness Psychiatric: Normal mood and  affect.   Labs on Admission: I have personally reviewed following labs and imaging studies  CBC: Recent Labs  Lab 02/07/20 1701  WBC 10.9*  HGB 15.9  HCT 47.3  MCV 82.4  PLT 248   Basic Metabolic Panel: Recent Labs  Lab 02/07/20 1701  NA 133*  K 5.9*  CL 98  CO2 20*  GLUCOSE 240*  BUN 35*  CREATININE 1.84*  CALCIUM 10.0   GFR: Estimated Creatinine Clearance: 47.2 mL/min (A) (by C-G formula based on SCr of 1.84 mg/dL (H)). Liver Function Tests: Recent Labs  Lab 02/07/20 1701  AST 901*  ALT 1,275*  ALKPHOS 150*  BILITOT 5.0*  PROT 7.6  ALBUMIN 4.2   Recent Labs  Lab 02/07/20 1701  LIPASE 22   No results for input(s): AMMONIA in the last 168 hours. Coagulation Profile: No results for input(s): INR, PROTIME in the last 168 hours. Cardiac Enzymes: No results for input(s): CKTOTAL, CKMB, CKMBINDEX, TROPONINI in the last 168 hours. BNP (last 3 results) No results for input(s): PROBNP in the last 8760 hours. HbA1C: No results for input(s): HGBA1C in the last 72 hours. CBG: No results for input(s): GLUCAP in the last 168 hours. Lipid Profile: No results for input(s): CHOL, HDL, LDLCALC, TRIG, CHOLHDL, LDLDIRECT in the last 72 hours. Thyroid Function Tests: No results for input(s): TSH, T4TOTAL, FREET4, T3FREE, THYROIDAB in the last 72 hours. Anemia Panel: No results for input(s): VITAMINB12, FOLATE, FERRITIN, TIBC, IRON, RETICCTPCT in the last 72 hours. Urine analysis:    Component Value Date/Time   COLORURINE AMBER (A) 02/07/2020 1746   APPEARANCEUR HAZY (A) 02/07/2020 1746   LABSPEC 1.029 02/07/2020 1746   PHURINE 5.0 02/07/2020 1746   GLUCOSEU 50 (A) 02/07/2020 1746   HGBUR SMALL (A) 02/07/2020 1746  BILIRUBINUR SMALL (A) 02/07/2020 1746   KETONESUR NEGATIVE 02/07/2020 1746   PROTEINUR 100 (A) 02/07/2020 1746   NITRITE NEGATIVE 02/07/2020 1746   LEUKOCYTESUR NEGATIVE 02/07/2020 1746    Radiological Exams on Admission: DG Chest 1  View  Result Date: 02/07/2020 CLINICAL DATA:  Shortness of breath and chest pain. EXAM: CHEST  1 VIEW COMPARISON:  02/06/2020 FINDINGS: Stable cardiomediastinal contours. The lung volumes are low. No pleural effusion or edema. No airspace densities identified. Review of the visualized osseous structures is unremarkable. IMPRESSION: 1. Lungs are suboptimally inflated but clear. Electronically Signed   By: Kerby Moors M.D.   On: 02/07/2020 17:49   CT Angio Chest PE W and/or Wo Contrast  Result Date: 02/07/2020 CLINICAL DATA:  64 year old male with abdominal pain and fever. EXAM: CT ANGIOGRAPHY CHEST CT ABDOMEN AND PELVIS WITH CONTRAST TECHNIQUE: Multidetector CT imaging of the chest was performed using the standard protocol during bolus administration of intravenous contrast. Multiplanar CT image reconstructions and MIPs were obtained to evaluate the vascular anatomy. Multidetector CT imaging of the abdomen and pelvis was performed using the standard protocol during bolus administration of intravenous contrast. CONTRAST:  43m OMNIPAQUE IOHEXOL 350 MG/ML SOLN COMPARISON:  Chest radiograph dated 02/07/2020 and CT of the abdomen pelvis dated 05/07/2007. FINDINGS: CTA CHEST FINDINGS Cardiovascular: Top-normal cardiac size. No pericardial effusion. Three-vessel coronary vascular calcification. The thoracic aorta is unremarkable. The origins of the great vessels of the aortic arch appear patent. Evaluation of the pulmonary arteries is somewhat limited due to respiratory motion artifact and suboptimal visualization of the peripheral branches. No central pulmonary artery embolus identified. Mediastinum/Nodes: No hilar or mediastinal adenopathy. The esophagus and the thyroid gland are grossly unremarkable. No mediastinal fluid collection. Lungs/Pleura: There are bibasilar subpleural atelectasis/scarring. No focal consolidation, pleural effusion, pneumothorax. The central airways are patent. Musculoskeletal: T8-T9  disc spacer. No acute osseous pathology. Review of the MIP images confirms the above findings. CT ABDOMEN and PELVIS FINDINGS No intra-abdominal free air or free fluid. Hepatobiliary: Fatty liver. No intrahepatic biliary dilatation. There is mild haziness of the gallbladder wall and pericholecystic fat. No calcified gallstone. Ultrasound may provide better evaluation of the gallbladder if there is clinical concern for acute cholecystitis. Pancreas: Unremarkable. No pancreatic ductal dilatation or surrounding inflammatory changes. Spleen: Normal in size without focal abnormality. Adrenals/Urinary Tract: The adrenal glands unremarkable. There is a 5.6 x 5.9 cm heterogeneously enhancing mass in the upper pole of the left kidney most concerning for malignancy. Further characterization with renal mass protocol MRI is recommended. Additional 2 cm hypodense lesion in the upper pole of the left kidney laterally is not characterized but possibly a cyst. There is no hydronephrosis on either side. There is symmetric enhancement and excretion of contrast by both kidneys. The visualized ureters and urinary bladder appear unremarkable. Stomach/Bowel: There is no bowel obstruction or active inflammation. Appendectomy. Vascular/Lymphatic: Mild aortoiliac atherosclerotic disease. The IVC is unremarkable. No portal venous gas. There is no adenopathy. Reproductive: The prostate and seminal vesicles are grossly unremarkable. No pelvic mass. Other: None Musculoskeletal: No acute or significant osseous findings. Review of the MIP images confirms the above findings. IMPRESSION: 1. No acute intrathoracic pathology. No CT evidence of central pulmonary artery embolus. 2. Mild haziness of the gallbladder wall and pericholecystic fat. No calcified gallstone. Ultrasound may provide better evaluation of the gallbladder if there is clinical concern for acute cholecystitis. 3. A 5.6 x 5.9 cm heterogeneously enhancing mass in the upper pole of the  left kidney most  concerning for malignancy. Further characterization with renal mass protocol MRI is recommended. 4. Fatty liver. 5. No bowel obstruction. 6. Aortic Atherosclerosis (ICD10-I70.0). Electronically Signed   By: Anner Crete M.D.   On: 02/07/2020 20:37   CT ABDOMEN PELVIS W CONTRAST  Result Date: 02/07/2020 CLINICAL DATA:  64 year old male with abdominal pain and fever. EXAM: CT ANGIOGRAPHY CHEST CT ABDOMEN AND PELVIS WITH CONTRAST TECHNIQUE: Multidetector CT imaging of the chest was performed using the standard protocol during bolus administration of intravenous contrast. Multiplanar CT image reconstructions and MIPs were obtained to evaluate the vascular anatomy. Multidetector CT imaging of the abdomen and pelvis was performed using the standard protocol during bolus administration of intravenous contrast. CONTRAST:  38m OMNIPAQUE IOHEXOL 350 MG/ML SOLN COMPARISON:  Chest radiograph dated 02/07/2020 and CT of the abdomen pelvis dated 05/07/2007. FINDINGS: CTA CHEST FINDINGS Cardiovascular: Top-normal cardiac size. No pericardial effusion. Three-vessel coronary vascular calcification. The thoracic aorta is unremarkable. The origins of the great vessels of the aortic arch appear patent. Evaluation of the pulmonary arteries is somewhat limited due to respiratory motion artifact and suboptimal visualization of the peripheral branches. No central pulmonary artery embolus identified. Mediastinum/Nodes: No hilar or mediastinal adenopathy. The esophagus and the thyroid gland are grossly unremarkable. No mediastinal fluid collection. Lungs/Pleura: There are bibasilar subpleural atelectasis/scarring. No focal consolidation, pleural effusion, pneumothorax. The central airways are patent. Musculoskeletal: T8-T9 disc spacer. No acute osseous pathology. Review of the MIP images confirms the above findings. CT ABDOMEN and PELVIS FINDINGS No intra-abdominal free air or free fluid. Hepatobiliary: Fatty liver.  No intrahepatic biliary dilatation. There is mild haziness of the gallbladder wall and pericholecystic fat. No calcified gallstone. Ultrasound may provide better evaluation of the gallbladder if there is clinical concern for acute cholecystitis. Pancreas: Unremarkable. No pancreatic ductal dilatation or surrounding inflammatory changes. Spleen: Normal in size without focal abnormality. Adrenals/Urinary Tract: The adrenal glands unremarkable. There is a 5.6 x 5.9 cm heterogeneously enhancing mass in the upper pole of the left kidney most concerning for malignancy. Further characterization with renal mass protocol MRI is recommended. Additional 2 cm hypodense lesion in the upper pole of the left kidney laterally is not characterized but possibly a cyst. There is no hydronephrosis on either side. There is symmetric enhancement and excretion of contrast by both kidneys. The visualized ureters and urinary bladder appear unremarkable. Stomach/Bowel: There is no bowel obstruction or active inflammation. Appendectomy. Vascular/Lymphatic: Mild aortoiliac atherosclerotic disease. The IVC is unremarkable. No portal venous gas. There is no adenopathy. Reproductive: The prostate and seminal vesicles are grossly unremarkable. No pelvic mass. Other: None Musculoskeletal: No acute or significant osseous findings. Review of the MIP images confirms the above findings. IMPRESSION: 1. No acute intrathoracic pathology. No CT evidence of central pulmonary artery embolus. 2. Mild haziness of the gallbladder wall and pericholecystic fat. No calcified gallstone. Ultrasound may provide better evaluation of the gallbladder if there is clinical concern for acute cholecystitis. 3. A 5.6 x 5.9 cm heterogeneously enhancing mass in the upper pole of the left kidney most concerning for malignancy. Further characterization with renal mass protocol MRI is recommended. 4. Fatty liver. 5. No bowel obstruction. 6. Aortic Atherosclerosis (ICD10-I70.0).  Electronically Signed   By: AAnner CreteM.D.   On: 02/07/2020 20:37   [place]ABDOMEN LIMITED RUQ (LIVER/GB)  Result Date: 02/07/2020 CLINICAL DATA:  Right upper quadrant pain x2 days. EXAM: ULTRASOUND ABDOMEN LIMITED RIGHT UPPER QUADRANT COMPARISON:  None. FINDINGS: Gallbladder: Echogenic sludge is seen within the gallbladder lumen,  without evidence of gallstones. Gallbladder wall thickening is noted (3.3 mm). A trace amount of pericholecystic fluid is also seen. No sonographic Murphy sign noted by sonographer. Common bile duct: Diameter: 3.6 mm Liver: No focal lesion identified. There is diffusely increased echogenicity of the liver parenchyma. Portal vein is patent on color Doppler imaging with normal direction of blood flow towards the liver. Other: None. IMPRESSION: Gallbladder sludge with additional findings that may represent sequelae associated with acalculous cholecystitis. Further evaluation with a nuclear medicine hepatobiliary scan is recommended. Electronically Signed   By: Virgina Norfolk M.D.   On: 02/07/2020 21:46    EKG: Independently reviewed.  Sinus tachycardia  Assessment/Plan Principal Problem:   Acute acalculous cholecystitis Active Problems:   Benign essential HTN   History of Meniere's disease   Diabetes mellitus, type II (HCC)   CAD (coronary artery disease)   CKD (chronic kidney disease) stage 3, GFR 30-59 ml/min (HCC)   Hyperkalemia   Severe sepsis (HCC)      Severe sepsis (POA) As evidenced by fever with a T-max of 101.4 F, tachycardia with heart rate in the 120s and tachypnea with mild leukocytosis of 10.9, elevated lactic acid level and a GI source of sepsis >> acalculous cholecystitis Patient noted to have markedly elevated liver enzymes and imaging suggestive of a calculus cholecystitis We will start patient empirically on IV antibiotics with Zosyn We will administer sepsis fluids per protocol Follow-up results of blood cultures We will consult  surgery    Transaminitis Most likely secondary to acute cholecystitis Hold statins, niacin and Zetia    Hyperkalemia Most likely ACE inhibitor induced Hold enalapril We will treat with dextrose and insulin Repeat potassium levels    History of coronary artery disease Continue nitrates and beta-blockers Hold antiplatelets for possible surgery Hold statins due to transaminitis    Diabetes mellitus with complications of stage III chronic kidney disease Keep patient n.p.o. for now Blood sugar checks every 4 hours with sliding scale insulin    Obesity (BMI 33) Complicates overall prognosis and care    Hypertension Continue nitrates and metoprolol Hold diltiazem for now  DVT prophylaxis: SCD Code Status: Full code Family Communication: Greater than 50% of time was spent discussing patient's condition and plan of care with him at the bedside.  All questions and concerns have been addressed.  He verbalizes understanding and agrees with the plan. Disposition Plan: Back to previous home environment Consults called: Surgery/GI    Collier Bullock MD [facility]     02/07/2020, 10:32 PM

## 2020-02-07 NOTE — ED Triage Notes (Signed)
Pt sent from [facility]. Pt c/o L sided CP that radiates to his R arm and SOB. Pt is lethargic on arrival. Pt is A&Ox4. Pt has a hx of MI approx 10 years ago.

## 2020-02-08 ENCOUNTER — Inpatient Hospital Stay: Payer: Medicare Other

## 2020-02-08 ENCOUNTER — Encounter: Payer: Self-pay | Admitting: Internal Medicine

## 2020-02-08 DIAGNOSIS — K810 Acute cholecystitis: Secondary | ICD-10-CM

## 2020-02-08 DIAGNOSIS — R748 Abnormal levels of other serum enzymes: Secondary | ICD-10-CM

## 2020-02-08 DIAGNOSIS — R933 Abnormal findings on diagnostic imaging of other parts of digestive tract: Secondary | ICD-10-CM

## 2020-02-08 DIAGNOSIS — R079 Chest pain, unspecified: Secondary | ICD-10-CM

## 2020-02-08 LAB — CBC
HCT: 39.3 % (ref 39.0–52.0)
Hemoglobin: 13.2 g/dL (ref 13.0–17.0)
MCH: 27.7 pg (ref 26.0–34.0)
MCHC: 33.6 g/dL (ref 30.0–36.0)
MCV: 82.6 fL (ref 80.0–100.0)
Platelets: 141 10*3/uL — ABNORMAL LOW (ref 150–400)
RBC: 4.76 MIL/uL (ref 4.22–5.81)
RDW: 13.9 % (ref 11.5–15.5)
WBC: 8.9 10*3/uL (ref 4.0–10.5)
nRBC: 0 % (ref 0.0–0.2)

## 2020-02-08 LAB — HEPATITIS PANEL, ACUTE
HCV Ab: NONREACTIVE
Hep A IgM: NONREACTIVE
Hep B C IgM: NONREACTIVE
Hepatitis B Surface Ag: NONREACTIVE

## 2020-02-08 LAB — CBG MONITORING, ED
Glucose-Capillary: 233 mg/dL — ABNORMAL HIGH (ref 70–99)
Glucose-Capillary: 238 mg/dL — ABNORMAL HIGH (ref 70–99)
Glucose-Capillary: 245 mg/dL — ABNORMAL HIGH (ref 70–99)
Glucose-Capillary: 253 mg/dL — ABNORMAL HIGH (ref 70–99)
Glucose-Capillary: 254 mg/dL — ABNORMAL HIGH (ref 70–99)

## 2020-02-08 LAB — URINE CULTURE: Culture: <10000 — AB

## 2020-02-08 LAB — CORTISOL-AM, BLOOD: Cortisol - AM: 13.3 ug/dL (ref 6.7–22.6)

## 2020-02-08 LAB — PROTIME-INR
INR: 1.4 — ABNORMAL HIGH (ref 0.8–1.2)
Prothrombin Time: 16.6 seconds — ABNORMAL HIGH (ref 11.4–15.2)

## 2020-02-08 LAB — COMPREHENSIVE METABOLIC PANEL
ALT: 731 U/L — ABNORMAL HIGH (ref 0–44)
AST: 359 U/L — ABNORMAL HIGH (ref 15–41)
Albumin: 3.1 g/dL — ABNORMAL LOW (ref 3.5–5.0)
Alkaline Phosphatase: 128 U/L — ABNORMAL HIGH (ref 38–126)
Anion gap: 9 (ref 5–15)
BUN: 26 mg/dL — ABNORMAL HIGH (ref 8–23)
CO2: 24 mmol/L (ref 22–32)
Calcium: 9.4 mg/dL (ref 8.9–10.3)
Chloride: 103 mmol/L (ref 98–111)
Creatinine, Ser: 1.4 mg/dL — ABNORMAL HIGH (ref 0.61–1.24)
GFR, Estimated: 56 mL/min — ABNORMAL LOW (ref >60)
Glucose, Bld: 252 mg/dL — ABNORMAL HIGH (ref 70–99)
Potassium: 4.5 mmol/L (ref 3.5–5.1)
Sodium: 136 mmol/L (ref 135–145)
Total Bilirubin: 4.5 mg/dL — ABNORMAL HIGH (ref 0.3–1.2)
Total Protein: 6.1 g/dL — ABNORMAL LOW (ref 6.5–8.1)

## 2020-02-08 LAB — HIV ANTIBODY (ROUTINE TESTING W REFLEX): HIV Screen 4th Generation wRfx: NONREACTIVE

## 2020-02-08 LAB — HEMOGLOBIN A1C
Hgb A1c MFr Bld: 8.2 % — ABNORMAL HIGH (ref 4.8–5.6)
Mean Plasma Glucose: 188.64 mg/dL

## 2020-02-08 LAB — ACETAMINOPHEN LEVEL: Acetaminophen (Tylenol), Serum: <10 ug/mL — ABNORMAL LOW (ref 10–30)

## 2020-02-08 LAB — PROCALCITONIN: Procalcitonin: 1.97 ng/mL

## 2020-02-08 MED ORDER — TECHNETIUM TC 99M MEBROFENIN IV KIT
5.0000 | PACK | Freq: Once | INTRAVENOUS | Status: AC | PRN
  Administered 2020-02-08: 5.2 via INTRAVENOUS
  Filled 2020-02-08: qty 5

## 2020-02-08 MED ORDER — OXYCODONE HCL 5 MG PO TABS
10.0000 mg | ORAL_TABLET | Freq: Four times a day (QID) | ORAL | Status: DC | PRN
  Administered 2020-02-08 – 2020-02-09 (×4): 10 mg via ORAL
  Filled 2020-02-08 (×4): qty 2

## 2020-02-08 MED ORDER — ACETAMINOPHEN 500 MG PO TABS
1000.0000 mg | ORAL_TABLET | Freq: Three times a day (TID) | ORAL | Status: DC | PRN
  Administered 2020-02-08 – 2020-02-09 (×2): 1000 mg via ORAL
  Filled 2020-02-08 (×2): qty 2

## 2020-02-08 MED ORDER — INSULIN ASPART 100 UNIT/ML ~~LOC~~ SOLN
SUBCUTANEOUS | Status: AC
  Filled 2020-02-08: qty 1

## 2020-02-08 MED ORDER — KETOROLAC TROMETHAMINE 30 MG/ML IJ SOLN
30.0000 mg | Freq: Once | INTRAMUSCULAR | Status: AC
  Administered 2020-02-08: 17:00:00 30 mg via INTRAVENOUS
  Filled 2020-02-08: qty 1

## 2020-02-08 MED ORDER — ENOXAPARIN SODIUM 60 MG/0.6ML ~~LOC~~ SOLN
0.5000 mg/kg | SUBCUTANEOUS | Status: DC
  Administered 2020-02-08 – 2020-02-15 (×8): 50 mg via SUBCUTANEOUS
  Filled 2020-02-08 (×9): qty 0.6

## 2020-02-08 MED ORDER — INSULIN REGULAR HUMAN (CONC) 500 UNIT/ML ~~LOC~~ SOPN
40.0000 [IU] | PEN_INJECTOR | Freq: Three times a day (TID) | SUBCUTANEOUS | Status: DC
  Filled 2020-02-08: qty 3

## 2020-02-08 MED ORDER — KETOROLAC TROMETHAMINE 30 MG/ML IJ SOLN: 15.0000 mg | Freq: Once | INTRAMUSCULAR | Status: DC

## 2020-02-08 MED ORDER — INSULIN ASPART 100 UNIT/ML ~~LOC~~ SOLN
0.0000 [IU] | Freq: Three times a day (TID) | SUBCUTANEOUS | Status: DC
  Administered 2020-02-08 – 2020-02-09 (×2): 8 [IU] via SUBCUTANEOUS
  Administered 2020-02-09: 08:00:00 11 [IU] via SUBCUTANEOUS
  Filled 2020-02-08 (×2): qty 1

## 2020-02-08 NOTE — ED Notes (Signed)
Pt assisted to bedside commode by NT per request. Unproductive attempt

## 2020-02-08 NOTE — ED Notes (Signed)
Pt taken for HIDA scan.

## 2020-02-08 NOTE — Consult Note (Signed)
[facility] SURGICAL CONSULTATION NOTE (initial) - cpt: 62229   HISTORY OF PRESENT ILLNESS (HPI):  64 y.o. male presented to [facility] ED yesterday for evaluation of chest pain. Patient reports the acute onset of chest pain Sunday which was radiating through to his right arm. He described this as a pressure like sensation. Nothing seemed to make this better. He reports a subjective fever, chills, fatigue, and nausea with the pain. No reports of cough, SOB, abdominal pain, emesis, urinary changes, or bowel changes. He does have a significant cardiac history including coronary artery disease status post stent angioplasty. Only previous intra-abdominal surgery is appendectomy. On presentation to the ED, he was found to be tachycardic and febrile with a T-Max of 101.98F. Laboratory results revealed a leukocytosis to 10.9K (now resolved at 8.9K), hyperkalemia to 5.9 (resolved, 4.5), sCr - 1.84 (improved to 1.40), Hyperbilirubinemia to 5.0 with primarily direct component at 3.6 (T-bili - 4.5 this morning), lactic acidosis to 3.1 (now resolved), and high-sensitivity troponin was normal. He underwent CTA Chest without evidence of PE, CT Abdomen/Pelvis and subsequent RUQ [name] were concerning for likely cholecystitis. He was started on Zosyn and admitted to medicine. He is currently pending HIDA scan.   Surgery is consulted by emergency medicine physician Dr. Delman Kitten, MD in this context for evaluation and management of cholecystitis.  PAST MEDICAL HISTORY (PMH):  Past Medical History:  Diagnosis Date  . Acute postoperative pain 12/03/2018  . Allergic rhinitis 12/30/2012  . Anginal pain ([place])   . Bronchitis    hx of  . Can't get food down 08/12/2014  . Chronic back pain    thoracic area  . Concussion 09/2015  . COPD (chronic obstructive pulmonary disease) ([place])   . Coronary artery disease    99% blockage  . DDD (degenerative disc disease), cervical   . Dehydration symptoms    2019  .  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 Dysphagia   . GERD (gastroesophageal reflux disease)   . History of Meniere's disease 12/21/2014  . History of thoracic spine surgery (S/P T9-10 IVD spacer) 12/21/2014  . Hypercholesteremia   . Hyperlipidemia   . Hypertension    sees Dr. Jenny Reichmann walker Jefm Bryant  . 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 Myocardial infarction [facility])    Sees Dr. Drema Dallas, [place] [facility]  . Neuromuscular disorder ([place])    diabetic neuropathy in feet  . Psychosis ([place])   . Short-segment Barrett's esophagus   . Sleep apnea    uses cpap and 2 L o2 at hs     PAST SURGICAL HISTORY [facility]):  Past Surgical History:  Procedure Laterality Date  . APPENDECTOMY    . ARTHRODESIS ANTERIOR ANTERIOR CERVICLE SPINE  01/04/2013  . BACK SURGERY     fusion thoracic area  . CARDIAC CATHETERIZATION     may 2012 and Nov 20, 2010  . CARDIAC CATHETERIZATION N/A 06/15/2015   Procedure: Left Heart Cath and Coronary Angiography;  Surgeon: Corey Skains, MD;  Location: [place] CV [facility];  Service: Cardiovascular;  Laterality: N/A;  . CARDIAC CATHETERIZATION N/A 06/15/2015   Procedure: Coronary Stent Intervention;  Surgeon: Isaias Cowman, MD;  Location: [place] CV [facility];  Service: Cardiovascular;  Laterality: N/A;  . CARDIOVASCULAR STRESS TEST     jan 2014  . COLONOSCOPY WITH PROPOFOL N/A 09/19/2014   Procedure: COLONOSCOPY WITH PROPOFOL;  Surgeon: Manya Silvas, MD;  Location: [facility] ENDOSCOPY;  Service: Endoscopy;  Laterality: N/A;  . CORONARY ANGIOPLASTY     stent placement  . ESOPHAGOGASTRODUODENOSCOPY N/A 09/19/2014   Procedure: ESOPHAGOGASTRODUODENOSCOPY (EGD);  Surgeon: Manya Silvas, MD;  Location: [facility] ENDOSCOPY;  Service: Endoscopy;  Laterality: N/A;  . [place] right ear  . LUMBAR SPINAL CORD SIMULATOR LEAD REMOVAL Right 08/09/2019   Procedure: REMOVAL SPINAL CORD STIMULATOR PERCUTANEOUS LEADS, REMOVAL  PULSE GENERATOR;  Surgeon: [name], MD;  Location: [facility] ORS;  Service: Neurosurgery;  Laterality: Right;  LOCAL WITH MAC  . mastoid shunt     left, 2002, 1997 right ear, 1980 right ear  . PULSE GENERATOR IMPLANT N/A 01/18/2019   Procedure: MEDTRONIC SPINAL CORD STIMULATOR BATTERY EXCHANGE;  Surgeon: [name], MD;  Location: [facility] ORS;  Service: Neurosurgery;  Laterality: N/A;  . SAVORY DILATION N/A 09/19/2014   Procedure: SAVORY DILATION;  Surgeon: Manya Silvas, MD;  Location: [facility] ENDOSCOPY;  Service: Endoscopy;  Laterality: N/A;  . SHOULDER ARTHROSCOPY WITH SUBACROMIAL DECOMPRESSION Left 04/06/2012   Procedure: SHOULDER ARTHROSCOPY WITH SUBACROMIAL DECOMPRESSION;  Surgeon: Vickey Huger, MD;  Location: [place];  Service: Orthopedics;  Laterality: Left;  left shoulder arthroscopy, subacromial decompression and distal clavicle resection  . SPINAL CORD STIMULATOR IMPLANT Right      MEDICATIONS:  Prior to Admission medications   Medication Sig Start Date End Date Taking? Authorizing Provider  ALPRAZolam Duanne Moron) 0.5 MG tablet Take 0.5 mg by mouth at bedtime.   Yes [provider]  cetirizine (ZYRTEC) 10 MG tablet Take 10 mg by mouth daily.    Yes [provider]  cyclobenzaprine (FLEXERIL) 10 MG tablet Take 1 tablet (10 mg total) by mouth 3 (three) times daily as needed for muscle spasms. 12/21/19 03/20/20 Yes Milinda Pointer, MD  diazepam (VALIUM) 5 MG tablet Take 5 mg by mouth 3 (three) times daily as needed (Meniere's disease).    Yes [provider]  diltiazem (CARDIZEM CD) 240 MG 24 hr capsule Take 240 mg by mouth at bedtime.  03/30/18  Yes [provider]  enalapril (VASOTEC) 10 MG tablet Take 10 mg by mouth 2 (two) times daily.  07/28/18  Yes [provider]  esomeprazole (NEXIUM) 40 MG capsule Take 40 mg by mouth 2 (two) times daily.   Yes [provider]  ezetimibe (ZETIA) 10 MG tablet Take 10 mg by mouth at bedtime.  03/26/17  Yes  [provider]  gabapentin (NEURONTIN) 800 MG tablet Take 2 tablets (1,600 mg total) by mouth 2 (two) times daily. 12/21/19 03/20/20 Yes Milinda Pointer, MD  HUMULIN R U-500 KWIKPEN 500 UNIT/ML kwikpen Inject 40 Units into the skin 3 (three) times daily with meals. Patient taking differently: Inject 70-80 Units into the skin See admin instructions. Can adjust according to blood sugar Inject 80 units with breakfast, 80 units with lunch, 70 units with dinner 01/19/18  Yes Max Sane, MD  isosorbide mononitrate (IMDUR) 30 MG 24 hr tablet Take 30 mg by mouth at bedtime.   Yes [provider]  Magnesium Oxide 500 MG TABS Take 1 tablet (500 mg total) by mouth daily. 12/21/19 03/20/20 Yes Milinda Pointer, MD  meclizine (ANTIVERT) 25 MG tablet Take 25 mg by mouth 3 (three) times daily as needed for dizziness.  07/06/15  Yes [provider]  metFORMIN (GLUCOPHAGE) 1000 MG tablet Take 1,000 mg by mouth 2 (two) times daily with a meal. 03/06/18  Yes [provider]  metoprolol succinate (TOPROL-XL) 100 MG 24 hr  tablet Take 100 mg by mouth 2 (two) times daily. Take with or immediately following a meal.   Yes [provider]  montelukast (SINGULAIR) 10 MG tablet Take 10 mg by mouth at bedtime.    Yes [provider]  niacin (NIASPAN) 500 MG CR tablet Take 500 mg by mouth at bedtime.   Yes [provider]  Omega-3 Fatty Acids (FISH OIL) 1200 MG CAPS Take 1,200 mg by mouth 2 (two) times daily.   Yes [provider]  Oxycodone HCl 10 MG TABS Take 1 tablet (10 mg total) by mouth every 6 (six) hours as needed. Must last 30 days 01/21/20 02/20/20 Yes Milinda Pointer, MD  prasugrel (EFFIENT) 10 MG TABS tablet Take 10 mg by mouth daily. 11/22/19  Yes [provider]  rosuvastatin (CRESTOR) 40 MG tablet Take 40 mg by mouth at bedtime.   Yes [provider]  Glucagon (BAQSIMI ONE PACK NA) Place 1 Dose into the nose daily as needed  (severely low blood sugar).    [provider]  HYDROcodone-acetaminophen (NORCO/VICODIN) 5-325 MG tablet Take 1 tablet by mouth 3 (three) times daily as needed. 01/28/20   [provider]  Magnesium 400 MG TABS Take 400 mg by mouth daily.     [provider]  naloxone [facility]) nasal spray 4 mg/0.1 mL Place 1 spray into the nose daily as needed (opioid overdose).    [provider]  naloxone [facility]) nasal spray 4 mg/0.1 mL For overdose/increased sleepiness. Call 911 if you need to use this medication 08/09/19   Zdeb, Altha Harm, NP  nitroGLYCERIN (NITROSTAT) 0.4 MG SL tablet Place 0.4 mg under the tongue every 5 (five) minutes as needed for chest pain.    [provider]     ALLERGIES:  No Known Allergies   SOCIAL HISTORY:  Social History   Socioeconomic History  . Marital status: Married    Spouse name: Not on file  . Number of children: Not on file  . Years of education: Not on file  . Highest education level: Not on file  Occupational History  . Not on file  Tobacco Use  . Smoking status: Never Smoker  . Smokeless tobacco: Never Used  Vaping Use  . Vaping Use: Never used  Substance and Sexual Activity  . Alcohol use: No  . Drug use: No  . Sexual activity: Not Currently  Other Topics Concern  . Not on file  Social History Narrative   Patient hospitalized x 3 events for high K+   Social Determinants of Health   Financial Resource Strain: Not on file  Food Insecurity: Not on file  Transportation Needs: Not on file  Physical Activity: Not on file  Stress: Not on file  Social Connections: Not on file  Intimate Partner Violence: Not on file     FAMILY HISTORY:  Family History  Problem Relation Age of Onset  . Heart disease Mother   . Diabetes Mother   . Heart disease Father   . Cancer Sister   . Heart disease Maternal Aunt   . Heart disease Maternal Uncle   . Diabetes Maternal Grandmother   . Diabetes Paternal  Grandmother       REVIEW OF SYSTEMS:  Review of Systems  Constitutional: Positive for fever and malaise/fatigue. Negative for chills.  HENT: Negative for congestion and sore throat.   Respiratory: Negative for cough and shortness of breath.   Cardiovascular: Positive for chest pain. Negative for palpitations.  Gastrointestinal:  Positive for abdominal pain and nausea. Negative for blood in stool, constipation, diarrhea and vomiting.  Genitourinary: Negative for dysuria and urgency.  All other systems reviewed and are negative.   VITAL SIGNS:  Temp:  [99.3 F (37.4 C)-101.4 F (38.6 C)] 99.3 F (37.4 C) (12/21 0655) Pulse Rate:  [98-121] 102 (12/21 0615) Resp:  [14-38] 29 (12/21 0615) BP: (95-146)/(50-92) 116/68 (12/21 0615) SpO2:  [90 %-95 %] 93 % (12/21 0615) Weight:  [102.1 kg] 102.1 kg (12/20 1645)     Height: _0  (175.3 cm) Weight: 102.1 kg BMI (Calculated): 33.21   INTAKE/OUTPUT:  12/20 0701 - 12/21 0700 In: 1100 [IV Piggyback:1100] Out: 600 [Urine:600]  PHYSICAL EXAM:  Physical Exam Vitals and nursing note reviewed.  Constitutional:      General: He is not in acute distress.    Appearance: He is well-developed. He is obese. He is not ill-appearing.  HENT:     Head: Normocephalic.  Eyes:     General: No scleral icterus.    Conjunctiva/sclera: Conjunctivae normal.  Cardiovascular:     Rate and Rhythm: Normal rate and regular rhythm.     Pulses: Normal pulses.  Pulmonary:     Effort: Pulmonary effort is normal. No respiratory distress.     Breath sounds: Normal breath sounds.  Abdominal:     General: Abdomen is protuberant. A surgical scar is present. There is no distension.     Palpations: Abdomen is soft.     Tenderness: There is abdominal tenderness in the right upper quadrant. There is no guarding or rebound. Negative signs include Murphy's sign.     Comments: Abdomen is protuberant consistent with degree of obesity, RUQ soreness, no rebound/guarding,  negative Murphy's Sign   Genitourinary:    Comments: deferred Musculoskeletal:     Right lower leg: No edema.     Left lower leg: No edema.  Skin:    General: Skin is warm and dry.     Coloration: Skin is not jaundiced.     Findings: No erythema.  Neurological:     General: No focal deficit present.     Mental Status: He is alert and oriented to person, place, and time.  Psychiatric:        Mood and Affect: Mood normal.        Behavior: Behavior normal.      Labs:  CBC Latest Ref Rng & Units 02/08/2020 02/07/2020 08/05/2019  WBC 4.0 - 10.5 K/uL 8.9 10.9(H) 6.6  Hemoglobin 13.0 - 17.0 g/dL 13.2 15.9 14.4  Hematocrit 39.0 - 52.0 % 39.3 47.3 41.4  Platelets 150 - 400 K/uL 141(L) 212 214   CMP Latest Ref Rng & Units 02/08/2020 02/07/2020 01/08/2019  Glucose 70 - 99 mg/dL 252(H) 240(H) 159(H)  BUN 8 - 23 mg/dL 26(H) 35(H) 20  Creatinine 0.61 - 1.24 mg/dL 1.40(H) 1.84(H) 1.46(H)  Sodium 135 - 145 mmol/L 136 133(L) 139  Potassium 3.5 - 5.1 mmol/L 4.5 5.9(H) 4.4  Chloride 98 - 111 mmol/L 103 98 101  CO2 22 - 32 mmol/L 24 20(L) 25  Calcium 8.9 - 10.3 mg/dL 9.4 10.0 9.7  Total Protein 6.5 - 8.1 g/dL 6.1(L) 7.6 -  Total Bilirubin 0.3 - 1.2 mg/dL 4.5(H) 5.0(H) -  Alkaline Phos 38 - 126 U/L 128(H) 150(H) -  AST 15 - 41 U/L 359(H) 901(H) -  ALT 0 - 44 U/L 731(H) 1,275(H) -     Imaging studies:   CTA Chest + CT Abdomen/Pelvis (02/07/2020) personally reviewed  showing possible stranding surrounding the gallbladder, no gross cholelithiasis, and radiologist report reviewed below:  IMPRESSION: 1. No acute intrathoracic pathology. No CT evidence of central pulmonary artery embolus. 2. Mild haziness of the gallbladder wall and pericholecystic fat. No calcified gallstone. Ultrasound may provide better evaluation of the gallbladder if there is clinical concern for acute cholecystitis. 3. A 5.6 x 5.9 cm heterogeneously enhancing mass in the upper pole of the left kidney most concerning  for malignancy. Further characterization with renal mass protocol MRI is recommended. 4. Fatty liver. 5. No bowel obstruction. 6. Aortic Atherosclerosis (ICD10-I70.0).   RUQ [name] (02/07/2020) personally reviewed with mild gallbladder wall thickening and a faint amount of pericholecystic fluid, no gross stones, some sludge, and radiologist report reviewed below:  IMPRESSION: Gallbladder sludge with additional findings that may represent sequelae associated with acalculous cholecystitis. Further evaluation with a nuclear medicine hepatobiliary scan is Recommended.   Assessment/Plan: (ICD-10's: K81.0) 64 y.o. male with sepsis most likely secondary to acute acalculous cholecystitis, complicated by numerous pertinent comorbidities including significant cardiovascular disease, CAD s/p PCI.   - Appreciate medicine admission  - Agree with Abx (Zosyn); follow up BCx  - Plan for HIDA scan  - Recommend consulting IR for evaluation for percutaneous cholecystostomy tube place. Patient is poor surgical candidate in this acute setting given his significant comorbid disease. He will benefit from control with cholecystostomy tube and optimization in the outpatient setting for interval cholecystectomy   - NPO for now + IVF Resuscitation  - Monitor abdominal examination  - Pain control prn; antiemetics prn   - Monitor LFTs; hyperbilirubinemia mildly improved  - Further management per primary service; we will follow   All of the above findings and recommendations were discussed with the patient, and all of patient's questions were answered to his expressed satisfaction.  Thank you for the opportunity to participate in this patient's care.   -- Edison Simon, PA-C [facility] 02/08/2020, 7:35 AM [phone] M-F: 7am - 4pm

## 2020-02-08 NOTE — ED Notes (Signed)
Report given to Raquel, RN

## 2020-02-08 NOTE — ED Notes (Signed)
Pt denies CP at this time. Alert and oriented x4. Calm, NAD. Pt resting comfortably. Pt denies further needs

## 2020-02-08 NOTE — ED Notes (Signed)
Pt moved into a hospital bed with [facility], Therapist, sports.

## 2020-02-08 NOTE — Consult Note (Addendum)
Kenneth Antigua, MD [address], [place], [place], [place], [address] [address], [place], [place], [place], [address] Phone: [phone]  Fax: [phone]  Consultation  Referring Provider:     Dr. Jacqualine Code primary Care Physician:  [name], MD Reason for Consultation:    Elevated liver enzymes  Date of Admission:  02/07/2020 Date of Consultation:  02/08/2020         HPI:   Kenneth Osborn is a 64 y.o. male who presents with chest pain radiating down to his right arm, pressure-like sensation, 5/10, with subjective fever chills fatigue, nausea with history of CAD with GI being consulted for elevated liver enzymes.  Patient tachycardic and febrile on admission With imaging showing gallbladder sludge, with normal common bile duct.  Patient denies any abdominal pain.  No prior history of similar symptoms.  Denies any heavy alcohol use.  Denies any new medications, over-the-counter products or herbal supplements.  Denies any recent antibiotic use.  Past Medical History:  Diagnosis Date  . Acute postoperative pain 12/03/2018  . Allergic rhinitis 12/30/2012  . Anginal pain ([place])   . Bronchitis    hx of  . Can't get food down 08/12/2014  . Chronic back pain    thoracic area  . Concussion 09/2015  . COPD (chronic obstructive pulmonary disease) ([place])   . Coronary artery disease    99% blockage  . DDD (degenerative disc disease), cervical   . Dehydration symptoms    2019  . 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 Dysphagia   . GERD (gastroesophageal reflux disease)   . History of Meniere's disease 12/21/2014  . History of thoracic spine surgery (S/P T9-10 IVD spacer) 12/21/2014  . Hypercholesteremia   . Hyperlipidemia   . Hypertension    sees Dr. Jenny Reichmann walker Jefm Bryant  . 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 Myocardial infarction [facility])    Sees Dr. Drema Dallas, [facility]  . Neuromuscular disorder ([place])    diabetic  neuropathy in feet  . Psychosis ([place])   . Short-segment Barrett's esophagus   . Sleep apnea    uses cpap and 2 L o2 at hs    Past Surgical History:  Procedure Laterality Date  . APPENDECTOMY    . ARTHRODESIS ANTERIOR ANTERIOR CERVICLE SPINE  01/04/2013  . BACK SURGERY     fusion thoracic area  . CARDIAC CATHETERIZATION     may 2012 and Nov 20, 2010  . CARDIAC CATHETERIZATION N/A 06/15/2015   Procedure: Left Heart Cath and Coronary Angiography;  Surgeon: Corey Skains, MD;  Location: [facility] CV LAB;  Service: Cardiovascular;  Laterality: N/A;  . CARDIAC CATHETERIZATION N/A 06/15/2015   Procedure: Coronary Stent Intervention;  Surgeon: Isaias Cowman, MD;  Location: [facility];  Service: Cardiovascular;  Laterality: N/A;  . CARDIOVASCULAR STRESS TEST     jan 2014  . COLONOSCOPY WITH PROPOFOL N/A 09/19/2014   Procedure: COLONOSCOPY WITH PROPOFOL;  Surgeon: Manya Silvas, MD;  Location: [facility] ENDOSCOPY;  Service: Endoscopy;  Laterality: N/A;  . CORONARY ANGIOPLASTY     stent placement  . ESOPHAGOGASTRODUODENOSCOPY N/A 09/19/2014   Procedure: ESOPHAGOGASTRODUODENOSCOPY (EGD);  Surgeon: Manya Silvas, MD;  Location: [facility] ENDOSCOPY;  Service: Endoscopy;  Laterality: N/A;  . [place] right ear  . LUMBAR SPINAL CORD SIMULATOR LEAD REMOVAL Right 08/09/2019   Procedure: REMOVAL SPINAL CORD STIMULATOR PERCUTANEOUS LEADS, REMOVAL PULSE  GENERATOR;  Surgeon: [name], MD;  Location: [facility] ORS;  Service: Neurosurgery;  Laterality: Right;  LOCAL WITH MAC  . mastoid shunt     left, 2002, 1997 right ear, 1980 right ear  . PULSE GENERATOR IMPLANT N/A 01/18/2019   Procedure: MEDTRONIC SPINAL CORD STIMULATOR BATTERY EXCHANGE;  Surgeon: [name], MD;  Location: [facility] ORS;  Service: Neurosurgery;  Laterality: N/A;  . SAVORY DILATION N/A 09/19/2014   Procedure: SAVORY DILATION;  Surgeon: Manya Silvas, MD;  Location: [facility] ENDOSCOPY;  Service: Endoscopy;   Laterality: N/A;  . SHOULDER ARTHROSCOPY WITH SUBACROMIAL DECOMPRESSION Left 04/06/2012   Procedure: SHOULDER ARTHROSCOPY WITH SUBACROMIAL DECOMPRESSION;  Surgeon: Vickey Huger, MD;  Location: [facility];  Service: Orthopedics;  Laterality: Left;  left shoulder arthroscopy, subacromial decompression and distal clavicle resection  . SPINAL CORD STIMULATOR IMPLANT Right     Prior to Admission medications   Medication Sig Start Date End Date Taking? Authorizing Provider  ALPRAZolam Duanne Moron) 0.5 MG tablet Take 0.5 mg by mouth at bedtime.   Yes [provider]  cetirizine (ZYRTEC) 10 MG tablet Take 10 mg by mouth daily.    Yes [provider]  cyclobenzaprine (FLEXERIL) 10 MG tablet Take 1 tablet (10 mg total) by mouth 3 (three) times daily as needed for muscle spasms. 12/21/19 03/20/20 Yes Milinda Pointer, MD  diazepam (VALIUM) 5 MG tablet Take 5 mg by mouth 3 (three) times daily as needed (Meniere's disease).    Yes [provider]  diltiazem (CARDIZEM CD) 240 MG 24 hr capsule Take 240 mg by mouth at bedtime.  03/30/18  Yes [provider]  enalapril (VASOTEC) 10 MG tablet Take 10 mg by mouth 2 (two) times daily.  07/28/18  Yes [provider]  esomeprazole (NEXIUM) 40 MG capsule Take 40 mg by mouth 2 (two) times daily.   Yes [provider]  ezetimibe (ZETIA) 10 MG tablet Take 10 mg by mouth at bedtime.  03/26/17  Yes [provider]  gabapentin (NEURONTIN) 800 MG tablet Take 2 tablets (1,600 mg total) by mouth 2 (two) times daily. 12/21/19 03/20/20 Yes Milinda Pointer, MD  HUMULIN R U-500 KWIKPEN 500 UNIT/ML kwikpen Inject 40 Units into the skin 3 (three) times daily with meals. Patient taking differently: Inject 70-80 Units into the skin See admin instructions. Can adjust according to blood sugar Inject 80 units with breakfast, 80 units with lunch, 70 units with dinner 01/19/18  Yes Max Sane, MD  isosorbide mononitrate (IMDUR) 30 MG 24 hr  tablet Take 30 mg by mouth at bedtime.   Yes [provider]  Magnesium Oxide 500 MG TABS Take 1 tablet (500 mg total) by mouth daily. 12/21/19 03/20/20 Yes Milinda Pointer, MD  meclizine (ANTIVERT) 25 MG tablet Take 25 mg by mouth 3 (three) times daily as needed for dizziness.  07/06/15  Yes [provider]  metFORMIN (GLUCOPHAGE) 1000 MG tablet Take 1,000 mg by mouth 2 (two) times daily with a meal. 03/06/18  Yes [provider]  metoprolol succinate (TOPROL-XL) 100 MG 24 hr tablet Take 100 mg by mouth 2 (two) times daily. Take with or immediately following a meal.   Yes [provider]  montelukast (SINGULAIR) 10 MG tablet Take 10 mg by mouth at bedtime.    Yes [provider]  niacin (NIASPAN) 500 MG CR tablet Take 500 mg by mouth at bedtime.   Yes [provider]  Omega-3 Fatty Acids (FISH OIL) 1200 MG CAPS Take 1,200 mg  by mouth 2 (two) times daily.   Yes [provider]  Oxycodone HCl 10 MG TABS Take 1 tablet (10 mg total) by mouth every 6 (six) hours as needed. Must last 30 days 01/21/20 02/20/20 Yes Milinda Pointer, MD  prasugrel (EFFIENT) 10 MG TABS tablet Take 10 mg by mouth daily. 11/22/19  Yes [provider]  rosuvastatin (CRESTOR) 40 MG tablet Take 40 mg by mouth at bedtime.   Yes [provider]  Glucagon (BAQSIMI ONE PACK NA) Place 1 Dose into the nose daily as needed (severely low blood sugar).    [provider]  HYDROcodone-acetaminophen (NORCO/VICODIN) 5-325 MG tablet Take 1 tablet by mouth 3 (three) times daily as needed. 01/28/20   [provider]  Magnesium 400 MG TABS Take 400 mg by mouth daily.     [provider]  naloxone [facility]) nasal spray 4 mg/0.1 mL Place 1 spray into the nose daily as needed (opioid overdose).    [provider]  naloxone [facility]) nasal spray 4 mg/0.1 mL For overdose/increased sleepiness. Call 911 if you need to use this medication  08/09/19   Zdeb, Altha Harm, NP  nitroGLYCERIN (NITROSTAT) 0.4 MG SL tablet Place 0.4 mg under the tongue every 5 (five) minutes as needed for chest pain.    [provider]    Family History  Problem Relation Age of Onset  . Heart disease Mother   . Diabetes Mother   . Heart disease Father   . Cancer Sister   . Heart disease Maternal Aunt   . Heart disease Maternal Uncle   . Diabetes Maternal Grandmother   . Diabetes Paternal Grandmother      Social History   Tobacco Use  . Smoking status: Never Smoker  . Smokeless tobacco: Never Used  Vaping Use  . Vaping Use: Never used  Substance Use Topics  . Alcohol use: No  . Drug use: No    Allergies as of 02/07/2020  . (No Known Allergies)    Review of Systems:    All systems reviewed and negative except where noted in HPI.   Physical Exam:  Vital signs in last 24 hours: Vitals:   02/08/20 0615 02/08/20 0655 02/08/20 0745 02/08/20 0938  BP: 116/68  120/64 (!) 117/57  Pulse: (!) 102  (!) 102 (!) 101  Resp: (!) 29  (!) 29   Temp:  99.3 F (37.4 C)    TempSrc:  Oral    SpO2: 93%  95% 94%  Weight:      Height:         General:   Pleasant, cooperative in NAD Head:  Normocephalic and atraumatic. Eyes:   No icterus.   Conjunctiva pink. PERRLA. Ears:  Normal auditory acuity. Neck:  Supple; no masses or thyroidomegaly Lungs: Respirations even and unlabored. Lungs clear to auscultation bilaterally.   No wheezes, crackles, or rhonchi.  Abdomen:  Soft, nondistended, nontender. Normal bowel sounds. No appreciable masses or hepatomegaly.  No rebound or guarding.  Neurologic:  Alert and oriented x3;  grossly normal neurologically. Skin:  Intact without significant lesions or rashes. Cervical Nodes:  No significant cervical adenopathy. Psych:  Alert and cooperative. Normal affect.  LAB RESULTS: Recent Labs    02/07/20 1701 02/08/20 0424  WBC 10.9* 8.9  HGB 15.9 13.2  HCT 47.3 39.3  PLT 212 141*   BMET Recent  Labs    02/07/20 1701 02/08/20 0424  NA 133* 136  K 5.9* 4.5  CL 98 103  CO2 20* 24  GLUCOSE 240* 252*  BUN 35* 26*  CREATININE 1.84* 1.40*  CALCIUM 10.0 9.4   LFT Recent Labs    02/07/20 1701 02/08/20 0424  PROT 7.6 6.1*  ALBUMIN 4.2 3.1*  AST 901* 359*  ALT 1,275* 731*  ALKPHOS 150* 128*  BILITOT 5.0* 4.5*  BILIDIR 3.6*  --   IBILI 1.4*  --    PT/INR Recent Labs    02/08/20 0424  LABPROT 16.6*  INR 1.4*    STUDIES: DG Chest 1 View  Result Date: 02/07/2020 CLINICAL DATA:  Shortness of breath and chest pain. EXAM: CHEST  1 VIEW COMPARISON:  02/06/2020 FINDINGS: Stable cardiomediastinal contours. The lung volumes are low. No pleural effusion or edema. No airspace densities identified. Review of the visualized osseous structures is unremarkable. IMPRESSION: 1. Lungs are suboptimally inflated but clear. Electronically Signed   By: Kerby Moors M.D.   On: 02/07/2020 17:49   CT Angio Chest PE W and/or Wo Contrast  Result Date: 02/07/2020 CLINICAL DATA:  64 year old male with abdominal pain and fever. EXAM: CT ANGIOGRAPHY CHEST CT ABDOMEN AND PELVIS WITH CONTRAST TECHNIQUE: Multidetector CT imaging of the chest was performed using the standard protocol during bolus administration of intravenous contrast. Multiplanar CT image reconstructions and MIPs were obtained to evaluate the vascular anatomy. Multidetector CT imaging of the abdomen and pelvis was performed using the standard protocol during bolus administration of intravenous contrast. CONTRAST:  35m OMNIPAQUE IOHEXOL 350 MG/ML SOLN COMPARISON:  Chest radiograph dated 02/07/2020 and CT of the abdomen pelvis dated 05/07/2007. FINDINGS: CTA CHEST FINDINGS Cardiovascular: Top-normal cardiac size. No pericardial effusion. Three-vessel coronary vascular calcification. The thoracic aorta is unremarkable. The origins of the great vessels of the aortic arch appear patent. Evaluation of the pulmonary arteries is somewhat limited  due to respiratory motion artifact and suboptimal visualization of the peripheral branches. No central pulmonary artery embolus identified. Mediastinum/Nodes: No hilar or mediastinal adenopathy. The esophagus and the thyroid gland are grossly unremarkable. No mediastinal fluid collection. Lungs/Pleura: There are bibasilar subpleural atelectasis/scarring. No focal consolidation, pleural effusion, pneumothorax. The central airways are patent. Musculoskeletal: T8-T9 disc spacer. No acute osseous pathology. Review of the MIP images confirms the above findings. CT ABDOMEN and PELVIS FINDINGS No intra-abdominal free air or free fluid. Hepatobiliary: Fatty liver. No intrahepatic biliary dilatation. There is mild haziness of the gallbladder wall and pericholecystic fat. No calcified gallstone. Ultrasound may provide better evaluation of the gallbladder if there is clinical concern for acute cholecystitis. Pancreas: Unremarkable. No pancreatic ductal dilatation or surrounding inflammatory changes. Spleen: Normal in size without focal abnormality. Adrenals/Urinary Tract: The adrenal glands unremarkable. There is a 5.6 x 5.9 cm heterogeneously enhancing mass in the upper pole of the left kidney most concerning for malignancy. Further characterization with renal mass protocol MRI is recommended. Additional 2 cm hypodense lesion in the upper pole of the left kidney laterally is not characterized but possibly a cyst. There is no hydronephrosis on either side. There is symmetric enhancement and excretion of contrast by both kidneys. The visualized ureters and urinary bladder appear unremarkable. Stomach/Bowel: There is no bowel obstruction or active inflammation. Appendectomy. Vascular/Lymphatic: Mild aortoiliac atherosclerotic disease. The IVC is unremarkable. No portal venous gas. There is no adenopathy. Reproductive: The prostate and seminal vesicles are grossly unremarkable. No pelvic mass. Other: None Musculoskeletal: No  acute or significant osseous findings. Review of the MIP images confirms the above findings. IMPRESSION: 1. No acute intrathoracic pathology. No CT evidence of central pulmonary  artery embolus. 2. Mild haziness of the gallbladder wall and pericholecystic fat. No calcified gallstone. Ultrasound may provide better evaluation of the gallbladder if there is clinical concern for acute cholecystitis. 3. A 5.6 x 5.9 cm heterogeneously enhancing mass in the upper pole of the left kidney most concerning for malignancy. Further characterization with renal mass protocol MRI is recommended. 4. Fatty liver. 5. No bowel obstruction. 6. Aortic Atherosclerosis (ICD10-I70.0). Electronically Signed   By: Anner Crete M.D.   On: 02/07/2020 20:37   CT ABDOMEN PELVIS W CONTRAST  Result Date: 02/07/2020 CLINICAL DATA:  64 year old male with abdominal pain and fever. EXAM: CT ANGIOGRAPHY CHEST CT ABDOMEN AND PELVIS WITH CONTRAST TECHNIQUE: Multidetector CT imaging of the chest was performed using the standard protocol during bolus administration of intravenous contrast. Multiplanar CT image reconstructions and MIPs were obtained to evaluate the vascular anatomy. Multidetector CT imaging of the abdomen and pelvis was performed using the standard protocol during bolus administration of intravenous contrast. CONTRAST:  32m OMNIPAQUE IOHEXOL 350 MG/ML SOLN COMPARISON:  Chest radiograph dated 02/07/2020 and CT of the abdomen pelvis dated 05/07/2007. FINDINGS: CTA CHEST FINDINGS Cardiovascular: Top-normal cardiac size. No pericardial effusion. Three-vessel coronary vascular calcification. The thoracic aorta is unremarkable. The origins of the great vessels of the aortic arch appear patent. Evaluation of the pulmonary arteries is somewhat limited due to respiratory motion artifact and suboptimal visualization of the peripheral branches. No central pulmonary artery embolus identified. Mediastinum/Nodes: No hilar or mediastinal  adenopathy. The esophagus and the thyroid gland are grossly unremarkable. No mediastinal fluid collection. Lungs/Pleura: There are bibasilar subpleural atelectasis/scarring. No focal consolidation, pleural effusion, pneumothorax. The central airways are patent. Musculoskeletal: T8-T9 disc spacer. No acute osseous pathology. Review of the MIP images confirms the above findings. CT ABDOMEN and PELVIS FINDINGS No intra-abdominal free air or free fluid. Hepatobiliary: Fatty liver. No intrahepatic biliary dilatation. There is mild haziness of the gallbladder wall and pericholecystic fat. No calcified gallstone. Ultrasound may provide better evaluation of the gallbladder if there is clinical concern for acute cholecystitis. Pancreas: Unremarkable. No pancreatic ductal dilatation or surrounding inflammatory changes. Spleen: Normal in size without focal abnormality. Adrenals/Urinary Tract: The adrenal glands unremarkable. There is a 5.6 x 5.9 cm heterogeneously enhancing mass in the upper pole of the left kidney most concerning for malignancy. Further characterization with renal mass protocol MRI is recommended. Additional 2 cm hypodense lesion in the upper pole of the left kidney laterally is not characterized but possibly a cyst. There is no hydronephrosis on either side. There is symmetric enhancement and excretion of contrast by both kidneys. The visualized ureters and urinary bladder appear unremarkable. Stomach/Bowel: There is no bowel obstruction or active inflammation. Appendectomy. Vascular/Lymphatic: Mild aortoiliac atherosclerotic disease. The IVC is unremarkable. No portal venous gas. There is no adenopathy. Reproductive: The prostate and seminal vesicles are grossly unremarkable. No pelvic mass. Other: None Musculoskeletal: No acute or significant osseous findings. Review of the MIP images confirms the above findings. IMPRESSION: 1. No acute intrathoracic pathology. No CT evidence of central pulmonary artery  embolus. 2. Mild haziness of the gallbladder wall and pericholecystic fat. No calcified gallstone. Ultrasound may provide better evaluation of the gallbladder if there is clinical concern for acute cholecystitis. 3. A 5.6 x 5.9 cm heterogeneously enhancing mass in the upper pole of the left kidney most concerning for malignancy. Further characterization with renal mass protocol MRI is recommended. 4. Fatty liver. 5. No bowel obstruction. 6. Aortic Atherosclerosis (ICD10-I70.0). Electronically Signed   By:  Anner Crete M.D.   On: 02/07/2020 20:37   US ABDOMEN LIMITED RUQ (LIVER/GB)  Result Date: 02/07/2020 CLINICAL DATA:  Right upper quadrant pain x2 days. EXAM: ULTRASOUND ABDOMEN LIMITED RIGHT UPPER QUADRANT COMPARISON:  None. FINDINGS: Gallbladder: Echogenic sludge is seen within the gallbladder lumen, without evidence of gallstones. Gallbladder wall thickening is noted (3.3 mm). A trace amount of pericholecystic fluid is also seen. No sonographic Murphy sign noted by sonographer. Common bile duct: Diameter: 3.6 mm Liver: No focal lesion identified. There is diffusely increased echogenicity of the liver parenchyma. Portal vein is patent on color Doppler imaging with normal direction of blood flow towards the liver. Other: None. IMPRESSION: Gallbladder sludge with additional findings that may represent sequelae associated with acalculous cholecystitis. Further evaluation with a nuclear medicine hepatobiliary scan is recommended. Electronically Signed   By: Virgina Norfolk M.D.   On: 02/07/2020 21:46      Impression / Plan:   ELONZO SOPP is a 64 y.o. y/o male with presentation for chest pain with elevated liver enzymes, gallbladder sludge on imaging, with HIDA scan pending for evaluation of acalculus cholecystitis  Imaging does not show any evidence of biliary obstruction Liver enzymes have already improved However, given elevation, I will order Tylenol level and acute hepatitis  panel  Findings most likely due to possible cholecystitis  Follow-up pending HIDA scan  Agree with antibiotics with gram-negative coverage due to possible cholecystitis  If liver enzymes do not improve as expected, can order further testing as necessary  Avoid hepatotoxic drugs  No indication for GI intervention at this point  Thank you for involving me in the care of this patient.      LOS: 1 day   Virgel Manifold, MD  02/08/2020, 10:47 AM

## 2020-02-08 NOTE — ED Notes (Signed)
Pt given pillow per request. Updated on wait time

## 2020-02-08 NOTE — Progress Notes (Signed)
PROGRESS NOTE    Kenneth Osborn  HXT:[id number] DOB: Mar 26, 1955 DOA: 02/07/2020 PCP: [name], MD    Assessment & Plan:   Principal Problem:   Acute acalculous cholecystitis Active Problems:   Benign essential HTN   History of Meniere's disease   Diabetes mellitus, type II ([place])   CAD (coronary artery disease)   CKD (chronic kidney disease) stage 3, GFR 30-59 ml/min (HCC)   Hyperkalemia   Severe sepsis (HCC)   Kenneth Osborn is a 64 y.o. 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  Chest pain non-cardiac, and pt was found to have acute acalculous cholecystitis.   Severe sepsis (POA) As evidenced by fever with a T-max of 101.4 F, tachycardia with heart rate in the 120s and tachypnea with mild leukocytosis of 10.9, elevated lactic acid level 3.1 and a GI source of sepsis >> acalculous cholecystitis.  Acute acalculous cholecystitis Patient noted to have markedly elevated liver enzymes and imaging suggestive of a calculus cholecystitis. --started patient empirically on IV Zosyn --GenSurg and GI consulted Plan: --cont IV zosyn --HIDA scan today --based on the result, pt may or may not need a perc chole tube.    Transaminitis and hyperbilirubinemia  Most likely secondary to acute cholecystitis Plan: Hold statins, niacin and Zetia --trend   AKI CKD 3a --Cr 1.84 on presentation, down to 1.40 next day with IV fluids --d/c MIVF and encourage oral hydration  Hyperkalemia, resolved Most likely ACE inhibitor induced --s/p dextrose and insulin --cont to hold enalapril  History of coronary artery disease Continue nitrates and beta-blockers Hold antiplatelets for possible surgery Hold statins due to  transaminitis  Diabetes mellitus poorly controlled with complications of stage III chronic kidney disease --A1c 8.2. --Hold home oral agents --resume home Humulin R as 40u TID with meals (takes more at home, per med list) --SSI TID  Obesity (BMI 33) Complicates overall prognosis and care  Hypertension Continue nitrates and metoprolol Hold diltiazem and enalapril  Chronic back pain on chronic opioids --resume home oxycodone 51m q6h PRN --cont home gabapentin  Hx of Mnire's disease --Home med included Valium for Meniere's disease, however, PDMP showed no current Rx for any benzos. --d/c V[place]um PRN   DVT prophylaxis: Lovenox SQ Code Status: Full code  Family Communication: wife updated at bedside today Status is: inpatient Dispo:   The patient is from: home Anticipated d/c is to: home Anticipated d/c date is: 2-3 days Patient currently is not medically stable to d/c due to: acute cholecystitis on IV zosyn, pending surgery eval and management   Subjective and Interval History:  Pt was complaining more of his chronic back pain than abdominal pain because he couldn't get opioids pain meds before HIDA scan.     Objective: Vitals:   02/08/20 0745 02/08/20 0938 02/08/20 1459 02/08/20 1630  BP: 120/64 (!) 117/57 (!) 107/51 (!) 109/47  Pulse: (!) 102 (!) 101 91 100  Resp: (!) 29  (!) 23 (!) 22  Temp:    98.6 F (37 C)  TempSrc:    Oral  SpO2: 95% 94% 97% 96%  Weight:      Height:        Intake/Output Summary (Last 24 hours) at 02/08/2020 1845 Last data filed at 02/07/2020 2359 Gross per 24 hour  Intake  1100 ml  Output 600 ml  Net 500 ml   Filed Weights   02/07/20 1645  Weight: 102.1 kg    Examination:   Constitutional: NAD, AAOx3 HEENT: conjunctivae and lids normal, EOMI CV: No cyanosis.   RESP: normal respiratory effort, on RA SKIN: dry and intact Neuro: II - XII grossly intact.   Psych: irritated mood and affect.     Data Reviewed: I have  personally reviewed following labs and imaging studies  CBC: Recent Labs  Lab 02/07/20 1701 02/08/20 0424  WBC 10.9* 8.9  HGB 15.9 13.2  HCT 47.3 39.3  MCV 82.4 82.6  PLT 212 440*   Basic Metabolic Panel: Recent Labs  Lab 02/07/20 1701 02/08/20 0424  NA 133* 136  K 5.9* 4.5  CL 98 103  CO2 20* 24  GLUCOSE 240* 252*  BUN 35* 26*  CREATININE 1.84* 1.40*  CALCIUM 10.0 9.4   GFR: Estimated Creatinine Clearance: 62 mL/min (A) (by C-G formula based on SCr of 1.4 mg/dL (H)). Liver Function Tests: Recent Labs  Lab 02/07/20 1701 02/08/20 0424  AST 901* 359*  ALT 1,275* 731*  ALKPHOS 150* 128*  BILITOT 5.0* 4.5*  PROT 7.6 6.1*  ALBUMIN 4.2 3.1*   Recent Labs  Lab 02/07/20 1701  LIPASE 22   Recent Labs  Lab 02/07/20 2300  AMMONIA 24   Coagulation Profile: Recent Labs  Lab 02/08/20 0424  INR 1.4*   Cardiac Enzymes: No results for input(s): CKTOTAL, CKMB, CKMBINDEX, TROPONINI in the last 168 hours. BNP (last 3 results) No results for input(s): PROBNP in the last 8760 hours. HbA1C: Recent Labs    02/07/20 2300  HGBA1C 8.2*   CBG: Recent Labs  Lab 02/07/20 2307 02/08/20 0036 02/08/20 0422 02/08/20 0737 02/08/20 1630  GLUCAP 193* 253* 233* 245* 238*   Lipid Profile: No results for input(s): CHOL, HDL, LDLCALC, TRIG, CHOLHDL, LDLDIRECT in the last 72 hours. Thyroid Function Tests: No results for input(s): TSH, T4TOTAL, FREET4, T3FREE, THYROIDAB in the last 72 hours. Anemia Panel: No results for input(s): VITAMINB12, FOLATE, FERRITIN, TIBC, IRON, RETICCTPCT in the last 72 hours. Sepsis Labs: Recent Labs  Lab 02/07/20 1746 02/07/20 2300 02/08/20 0424  PROCALCITON  --   --  1.97  LATICACIDVEN 3.1* 1.8  --     Recent Results (from the past 240 hour(s))  Resp Panel by RT-PCR (Flu A&B, Covid) Nasopharyngeal Swab     Status: None   Collection Time: 02/07/20  5:46 PM   Specimen: Nasopharyngeal Swab; Nasopharyngeal(NP) swabs in vial transport  medium  Result Value Ref Range Status   SARS Coronavirus 2 by RT PCR NEGATIVE NEGATIVE Final    Comment: (NOTE) SARS-CoV-2 target nucleic acids are NOT DETECTED.  The SARS-CoV-2 RNA is generally detectable in upper respiratory specimens during the acute phase of infection. The lowest concentration of SARS-CoV-2 viral copies this assay can detect is 138 copies/mL. A negative result does not preclude SARS-Cov-2 infection and should not be used as the sole basis for treatment or other patient management decisions. A negative result may occur with  improper specimen collection/handling, submission of specimen other than nasopharyngeal swab, presence of viral mutation(s) within the areas targeted by this assay, and inadequate number of viral copies(<138 copies/mL). A negative result must be combined with clinical observations, patient history, and epidemiological information. The expected result is Negative.  Fact Sheet for Patients:  EntrepreneurPulse.com.au  Fact Sheet for Healthcare Providers:  IncredibleEmployment.be  This test is no t yet approved or  cleared by the [place] and  has been authorized for detection and/or diagnosis of SARS-CoV-2 by FDA under an Emergency Use Authorization (EUA). This EUA will remain  in effect (meaning this test can be used) for the duration of the COVID-19 declaration under Section 564(b)(1) of the Act, 21 U.S.C.section 360bbb-3(b)(1), unless the authorization is terminated  or revoked sooner.       Influenza A by PCR NEGATIVE NEGATIVE Final   Influenza B by PCR NEGATIVE NEGATIVE Final    Comment: (NOTE) The Xpert Xpress SARS-CoV-2/FLU/RSV plus assay is intended as an aid in the diagnosis of influenza from Nasopharyngeal swab specimens and should not be used as a sole basis for treatment. Nasal washings and aspirates are unacceptable for Xpert Xpress SARS-CoV-2/FLU/RSV testing.  Fact Sheet for  Patients: EntrepreneurPulse.com.au  Fact Sheet for Healthcare Providers: IncredibleEmployment.be  This test is not yet approved or cleared by the [place] FDA and has been authorized for detection and/or diagnosis of SARS-CoV-2 by FDA under an Emergency Use Authorization (EUA). This EUA will remain in effect (meaning this test can be used) for the duration of the COVID-19 declaration under Section 564(b)(1) of the Act, 21 U.S.C. section 360bbb-3(b)(1), unless the authorization is terminated or revoked.  Performed at [facility], [address] [place], [place] [address]   Culture, blood (Routine X 2) w Reflex to ID Panel     Status: None (Preliminary result)   Collection Time: 02/07/20  5:46 PM   Specimen: BLOOD  Result Value Ref Range Status   Specimen Description BLOOD BLOOD RIGHT FOREARM  Final   Special Requests   Final    BOTTLES DRAWN AEROBIC AND ANAEROBIC Blood Culture adequate volume   Culture   Final    NO GROWTH < 12 HOURS Performed at [facility], [address], [place], [place] [address]    Report Status PENDING  Incomplete  Culture, blood (Routine X 2) w Reflex to ID Panel     Status: None (Preliminary result)   Collection Time: 02/07/20  5:46 PM   Specimen: BLOOD  Result Value Ref Range Status   Specimen Description BLOOD LEFT ANTECUBITAL  Final   Special Requests   Final    BOTTLES DRAWN AEROBIC AND ANAEROBIC Blood Culture adequate volume   Culture   Final    NO GROWTH < 12 HOURS Performed at [facility], [address] [place], [place] [address]    Report Status PENDING  Incomplete      Radiology Studies: DG Chest 1 View  Result Date: 02/07/2020 CLINICAL DATA:  Shortness of breath and chest pain. EXAM: CHEST  1 VIEW COMPARISON:  02/06/2020 FINDINGS: Stable cardiomediastinal contours. The lung volumes are low. No pleural effusion or edema. No airspace densities identified. Review  of the visualized osseous structures is unremarkable. IMPRESSION: 1. Lungs are suboptimally inflated but clear. Electronically Signed   By: Kerby Moors M.D.   On: 02/07/2020 17:49   CT Angio Chest PE W and/or Wo Contrast  Result Date: 02/07/2020 CLINICAL DATA:  64 year old male with abdominal pain and fever. EXAM: CT ANGIOGRAPHY CHEST CT ABDOMEN AND PELVIS WITH CONTRAST TECHNIQUE: Multidetector CT imaging of the chest was performed using the standard protocol during bolus administration of intravenous contrast. Multiplanar CT image reconstructions and MIPs were obtained to evaluate the vascular anatomy. Multidetector CT imaging of the abdomen and pelvis was performed using the standard protocol during bolus administration of intravenous contrast. CONTRAST:  60m OMNIPAQUE IOHEXOL 350  MG/ML SOLN COMPARISON:  Chest radiograph dated 02/07/2020 and CT of the abdomen pelvis dated 05/07/2007. FINDINGS: CTA CHEST FINDINGS Cardiovascular: Top-normal cardiac size. No pericardial effusion. Three-vessel coronary vascular calcification. The thoracic aorta is unremarkable. The origins of the great vessels of the aortic arch appear patent. Evaluation of the pulmonary arteries is somewhat limited due to respiratory motion artifact and suboptimal visualization of the peripheral branches. No central pulmonary artery embolus identified. Mediastinum/Nodes: No hilar or mediastinal adenopathy. The esophagus and the thyroid gland are grossly unremarkable. No mediastinal fluid collection. Lungs/Pleura: There are bibasilar subpleural atelectasis/scarring. No focal consolidation, pleural effusion, pneumothorax. The central airways are patent. Musculoskeletal: T8-T9 disc spacer. No acute osseous pathology. Review of the MIP images confirms the above findings. CT ABDOMEN and PELVIS FINDINGS No intra-abdominal free air or free fluid. Hepatobiliary: Fatty liver. No intrahepatic biliary dilatation. There is mild haziness of the  gallbladder wall and pericholecystic fat. No calcified gallstone. Ultrasound may provide better evaluation of the gallbladder if there is clinical concern for acute cholecystitis. Pancreas: Unremarkable. No pancreatic ductal dilatation or surrounding inflammatory changes. Spleen: Normal in size without focal abnormality. Adrenals/Urinary Tract: The adrenal glands unremarkable. There is a 5.6 x 5.9 cm heterogeneously enhancing mass in the upper pole of the left kidney most concerning for malignancy. Further characterization with renal mass protocol MRI is recommended. Additional 2 cm hypodense lesion in the upper pole of the left kidney laterally is not characterized but possibly a cyst. There is no hydronephrosis on either side. There is symmetric enhancement and excretion of contrast by both kidneys. The visualized ureters and urinary bladder appear unremarkable. Stomach/Bowel: There is no bowel obstruction or active inflammation. Appendectomy. Vascular/Lymphatic: Mild aortoiliac atherosclerotic disease. The IVC is unremarkable. No portal venous gas. There is no adenopathy. Reproductive: The prostate and seminal vesicles are grossly unremarkable. No pelvic mass. Other: None Musculoskeletal: No acute or significant osseous findings. Review of the MIP images confirms the above findings. IMPRESSION: 1. No acute intrathoracic pathology. No CT evidence of central pulmonary artery embolus. 2. Mild haziness of the gallbladder wall and pericholecystic fat. No calcified gallstone. Ultrasound may provide better evaluation of the gallbladder if there is clinical concern for acute cholecystitis. 3. A 5.6 x 5.9 cm heterogeneously enhancing mass in the upper pole of the left kidney most concerning for malignancy. Further characterization with renal mass protocol MRI is recommended. 4. Fatty liver. 5. No bowel obstruction. 6. Aortic Atherosclerosis (ICD10-I70.0). Electronically Signed   By: Anner Crete M.D.   On: 02/07/2020  20:37   NM Hepatobiliary Liver Func  Result Date: 02/08/2020 CLINICAL DATA:  Chronic upper abdominal pain assess gallbladder motility EXAM: NUCLEAR MEDICINE HEPATOBILIARY IMAGING TECHNIQUE: Sequential images of the abdomen were obtained out to 60 minutes following intravenous administration of radiopharmaceutical. RADIOPHARMACEUTICALS:  5.2 mCi Tc-53m Choletec IV COMPARISON:  02/07/2020 ultrasound abdomen and CT abdomen/pelvis exams FINDINGS: Delayed bloodstream clearance of tracer by liver indicating hepatocellular dysfunction. Prolonged hepatocellular retention of tracer. At 1 hour, no excretion of tracer into biliary tree. At 2 hours, no excretion of tracer into biliary tree. Static image at 4 hours shows minimal tracer in small bowel loops in the LEFT mid abdomen indicating at least partial patency of the CBD. Gallbladder did not visualized by 4 hours. However, because of the severe degree of hepatocellular dysfunction and poor excretion of tracer, unable to assess patency of the cystic duct to exclude cholecystitis. Patient refused imaging beyond 4 hours. IMPRESSION: Severe hepatocellular dysfunction with marked retention of  tracer within hepatic parenchyma at 4 hours. Only minimal tracer is seen within small bowel loops in the LEFT mid abdomen at 4 hours, indicating at least partial patency of CBD. Unable to assess patency of cystic duct, acute cholecystitis, or gallbladder emptying due to the severe degree of hepatocellular dysfunction. Electronically Signed   By: Lavonia Dana M.D.   On: 02/08/2020 16:00   CT ABDOMEN PELVIS W CONTRAST  Result Date: 02/07/2020 CLINICAL DATA:  64 year old male with abdominal pain and fever. EXAM: CT ANGIOGRAPHY CHEST CT ABDOMEN AND PELVIS WITH CONTRAST TECHNIQUE: Multidetector CT imaging of the chest was performed using the standard protocol during bolus administration of intravenous contrast. Multiplanar CT image reconstructions and MIPs were obtained to evaluate the  vascular anatomy. Multidetector CT imaging of the abdomen and pelvis was performed using the standard protocol during bolus administration of intravenous contrast. CONTRAST:  33m OMNIPAQUE IOHEXOL 350 MG/ML SOLN COMPARISON:  Chest radiograph dated 02/07/2020 and CT of the abdomen pelvis dated 05/07/2007. FINDINGS: CTA CHEST FINDINGS Cardiovascular: Top-normal cardiac size. No pericardial effusion. Three-vessel coronary vascular calcification. The thoracic aorta is unremarkable. The origins of the great vessels of the aortic arch appear patent. Evaluation of the pulmonary arteries is somewhat limited due to respiratory motion artifact and suboptimal visualization of the peripheral branches. No central pulmonary artery embolus identified. Mediastinum/Nodes: No hilar or mediastinal adenopathy. The esophagus and the thyroid gland are grossly unremarkable. No mediastinal fluid collection. Lungs/Pleura: There are bibasilar subpleural atelectasis/scarring. No focal consolidation, pleural effusion, pneumothorax. The central airways are patent. Musculoskeletal: T8-T9 disc spacer. No acute osseous pathology. Review of the MIP images confirms the above findings. CT ABDOMEN and PELVIS FINDINGS No intra-abdominal free air or free fluid. Hepatobiliary: Fatty liver. No intrahepatic biliary dilatation. There is mild haziness of the gallbladder wall and pericholecystic fat. No calcified gallstone. Ultrasound may provide better evaluation of the gallbladder if there is clinical concern for acute cholecystitis. Pancreas: Unremarkable. No pancreatic ductal dilatation or surrounding inflammatory changes. Spleen: Normal in size without focal abnormality. Adrenals/Urinary Tract: The adrenal glands unremarkable. There is a 5.6 x 5.9 cm heterogeneously enhancing mass in the upper pole of the left kidney most concerning for malignancy. Further characterization with renal mass protocol MRI is recommended. Additional 2 cm hypodense lesion in  the upper pole of the left kidney laterally is not characterized but possibly a cyst. There is no hydronephrosis on either side. There is symmetric enhancement and excretion of contrast by both kidneys. The visualized ureters and urinary bladder appear unremarkable. Stomach/Bowel: There is no bowel obstruction or active inflammation. Appendectomy. Vascular/Lymphatic: Mild aortoiliac atherosclerotic disease. The IVC is unremarkable. No portal venous gas. There is no adenopathy. Reproductive: The prostate and seminal vesicles are grossly unremarkable. No pelvic mass. Other: None Musculoskeletal: No acute or significant osseous findings. Review of the MIP images confirms the above findings. IMPRESSION: 1. No acute intrathoracic pathology. No CT evidence of central pulmonary artery embolus. 2. Mild haziness of the gallbladder wall and pericholecystic fat. No calcified gallstone. Ultrasound may provide better evaluation of the gallbladder if there is clinical concern for acute cholecystitis. 3. A 5.6 x 5.9 cm heterogeneously enhancing mass in the upper pole of the left kidney most concerning for malignancy. Further characterization with renal mass protocol MRI is recommended. 4. Fatty liver. 5. No bowel obstruction. 6. Aortic Atherosclerosis (ICD10-I70.0). Electronically Signed   By: AAnner CreteM.D.   On: 02/07/2020 20:37   [place] LIMITED RUQ (LIVER/GB)  Result Date: 02/07/2020 CLINICAL  DATA:  Right upper quadrant pain x2 days. EXAM: ULTRASOUND ABDOMEN LIMITED RIGHT UPPER QUADRANT COMPARISON:  None. FINDINGS: Gallbladder: Echogenic sludge is seen within the gallbladder lumen, without evidence of gallstones. Gallbladder wall thickening is noted (3.3 mm). A trace amount of pericholecystic fluid is also seen. No sonographic Murphy sign noted by sonographer. Common bile duct: Diameter: 3.6 mm Liver: No focal lesion identified. There is diffusely increased echogenicity of the liver parenchyma. Portal vein is  patent on color Doppler imaging with normal direction of blood flow towards the liver. Other: None. IMPRESSION: Gallbladder sludge with additional findings that may represent sequelae associated with acalculous cholecystitis. Further evaluation with a nuclear medicine hepatobiliary scan is recommended. Electronically Signed   By: Virgina Norfolk M.D.   On: 02/07/2020 21:46     Scheduled Meds: . ALPRAZolam  0.5 mg Oral QHS  . gabapentin  1,600 mg Oral BID  . insulin aspart  0-15 Units Subcutaneous Q4H  . isosorbide mononitrate  30 mg Oral QHS  . loratadine  10 mg Oral Daily  . metoprolol succinate  100 mg Oral BID  . montelukast  10 mg Oral QHS   Continuous Infusions: . sodium chloride 125 mL/hr at 02/07/20 2308  . piperacillin-tazobactam (ZOSYN)  IV 3.375 g (02/08/20 1635)     LOS: 1 day     Enzo Bi, MD [facility] If 7PM-7AM, please contact night-coverage 02/08/2020, 6:45 PM

## 2020-02-08 NOTE — Progress Notes (Signed)
Inpatient Diabetes Program Recommendations  AACE/ADA: New Consensus Statement on Inpatient Glycemic Control (2015)  Target Ranges:  Prepandial:   less than 140 mg/dL      Peak postprandial:   less than 180 mg/dL (1-2 hours)      Critically ill patients:  140 - 180 mg/dL   Lab Results  Component Value Date   GLUCAP 245 (H) 02/08/2020    Review of Glycemic Control Results for Kenneth Osborn, Kenneth Osborn" (MRN [id number]) as of 02/08/2020 10:32  Ref. Range 02/07/2020 23:07 02/08/2020 00:36 02/08/2020 04:22 02/08/2020 07:37  Glucose-Capillary Latest Ref Range: 70 - 99 mg/dL 193 (H) 253 (H) 233 (H) 245 (H)   Diabetes history:  DM2 Outpatient Diabetes medications:  U-500 70-80 units TID with meals Metformin 1000 mg BID Current orders for Inpatient glycemic control:  Novolog 0-15 units Q4H  Inpatient Diabetes Program Recommendations:     Lantus 20 units daily (102.1kg/0.2 units)  Will continue to follow while inpatient.  Thank you, Reche Dixon, RN, BSN Diabetes Coordinator Inpatient Diabetes Program [phone] (team pager from 8a-5p)

## 2020-02-08 NOTE — ED Notes (Signed)
HIDA scan scheduled for 1130. Pt made aware of time and need to stay NPO. Pt agreeable with plan.

## 2020-02-08 NOTE — Progress Notes (Signed)
PHARMACIST - PHYSICIAN COMMUNICATION  CONCERNING:  Enoxaparin (Lovenox) for DVT Prophylaxis    RECOMMENDATION: Patient was prescribed enoxaparin 58m q24 hours for VTE prophylaxis.   Filed Weights   02/07/20 1645  Weight: 102.1 kg (225 lb)    Body mass index is 33.23 kg/m.  Estimated Creatinine Clearance: 62 mL/min (A) (by C-G formula based on SCr of 1.4 mg/dL (H)).   Based on [place]patient is candidate for enoxaparin 0.547mkg TBW SQ every 24 hours based on BMI being >30.  DESCRIPTION: [facility]  Patient is now receiving enoxaparin 50 mg every 24 hours    AlBenita Gutter2/21/2021 8:30 PM

## 2020-02-09 ENCOUNTER — Inpatient Hospital Stay: Payer: Medicare Other

## 2020-02-09 ENCOUNTER — Inpatient Hospital Stay
Admit: 2020-02-09 | Discharge: 2020-02-09 | Disposition: A | Discharge status: Home / Self Care | Payer: Medicare Other | Attending: Surgery | Admitting: Surgery

## 2020-02-09 DIAGNOSIS — R945 Abnormal results of liver function studies: Secondary | ICD-10-CM

## 2020-02-09 DIAGNOSIS — R1011 Right upper quadrant pain: Secondary | ICD-10-CM

## 2020-02-09 DIAGNOSIS — K810 Acute cholecystitis: Secondary | ICD-10-CM | POA: Diagnosis not present

## 2020-02-09 HISTORY — PX: IR PERC CHOLECYSTOSTOMY: IMG2326

## 2020-02-09 LAB — BLOOD GAS, ARTERIAL
Acid-base deficit: 2.4 mmol/L — ABNORMAL HIGH (ref 0.0–2.0)
Acid-base deficit: 2 mmol/L (ref 0.0–2.0)
Bicarbonate: 23.7 mmol/L (ref 20.0–28.0)
Bicarbonate: 23.7 mmol/L (ref 20.0–28.0)
FIO2: 1
FIO2: 0.6
MECHVT: 500 mL
Mechanical Rate: 20
O2 Saturation: 99.7 %
O2 Saturation: 99.5 %
PEEP: 5 cmH2O
Patient temperature: 37
Patient temperature: 37
RATE: 20 resp/min
pCO2 arterial: 45 mmHg (ref 32.0–48.0)
pCO2 arterial: 43 mmHg (ref 32.0–48.0)
pH, Arterial: 7.33 — ABNORMAL LOW (ref 7.350–7.450)
pH, Arterial: 7.35 (ref 7.350–7.450)
pO2, Arterial: 199 mmHg — ABNORMAL HIGH (ref 83.0–108.0)
pO2, Arterial: 174 mmHg — ABNORMAL HIGH (ref 83.0–108.0)

## 2020-02-09 LAB — GLUCOSE, CAPILLARY
Glucose-Capillary: 259 mg/dL — ABNORMAL HIGH (ref 70–99)
Glucose-Capillary: 267 mg/dL — ABNORMAL HIGH (ref 70–99)
Glucose-Capillary: 225 mg/dL — ABNORMAL HIGH (ref 70–99)
Glucose-Capillary: 231 mg/dL — ABNORMAL HIGH (ref 70–99)
Glucose-Capillary: 250 mg/dL — ABNORMAL HIGH (ref 70–99)
Glucose-Capillary: 269 mg/dL — ABNORMAL HIGH (ref 70–99)

## 2020-02-09 LAB — COMPREHENSIVE METABOLIC PANEL
ALT: 488 U/L — ABNORMAL HIGH (ref 0–44)
AST: 213 U/L — ABNORMAL HIGH (ref 15–41)
Albumin: 3.1 g/dL — ABNORMAL LOW (ref 3.5–5.0)
Alkaline Phosphatase: 198 U/L — ABNORMAL HIGH (ref 38–126)
Anion gap: 12 (ref 5–15)
BUN: 21 mg/dL (ref 8–23)
CO2: 21 mmol/L — ABNORMAL LOW (ref 22–32)
Calcium: 9.5 mg/dL (ref 8.9–10.3)
Chloride: 104 mmol/L (ref 98–111)
Creatinine, Ser: 1.22 mg/dL (ref 0.61–1.24)
GFR, Estimated: >60 mL/min (ref >60)
Glucose, Bld: 328 mg/dL — ABNORMAL HIGH (ref 70–99)
Potassium: 4.5 mmol/L (ref 3.5–5.1)
Sodium: 137 mmol/L (ref 135–145)
Total Bilirubin: 4 mg/dL — ABNORMAL HIGH (ref 0.3–1.2)
Total Protein: 6.5 g/dL (ref 6.5–8.1)

## 2020-02-09 LAB — CBC
HCT: 38 % — ABNORMAL LOW (ref 39.0–52.0)
Hemoglobin: 12.6 g/dL — ABNORMAL LOW (ref 13.0–17.0)
MCH: 27.4 pg (ref 26.0–34.0)
MCHC: 33.2 g/dL (ref 30.0–36.0)
MCV: 82.6 fL (ref 80.0–100.0)
Platelets: 147 10*3/uL — ABNORMAL LOW (ref 150–400)
RBC: 4.6 MIL/uL (ref 4.22–5.81)
RDW: 14.4 % (ref 11.5–15.5)
WBC: 7.6 10*3/uL (ref 4.0–10.5)
nRBC: 0 % (ref 0.0–0.2)

## 2020-02-09 LAB — TRIGLYCERIDES: Triglycerides: 136 mg/dL (ref <150)

## 2020-02-09 LAB — CBG MONITORING, ED: Glucose-Capillary: 314 mg/dL — ABNORMAL HIGH (ref 70–99)

## 2020-02-09 LAB — HEPATITIS B CORE ANTIBODY, TOTAL: Hep B Core Total Ab: NONREACTIVE

## 2020-02-09 LAB — MAGNESIUM: Magnesium: 1.6 mg/dL — ABNORMAL LOW (ref 1.7–2.4)

## 2020-02-09 LAB — HEPATITIS A ANTIBODY, TOTAL: hep A Total Ab: NONREACTIVE

## 2020-02-09 MED ORDER — CHLORHEXIDINE GLUCONATE CLOTH 2 % EX PADS
6.0000 | MEDICATED_PAD | Freq: Every day | CUTANEOUS | Status: DC
  Administered 2020-02-10 – 2020-02-13 (×4): 6 via TOPICAL

## 2020-02-09 MED ORDER — ONDANSETRON HCL 4 MG/2ML IJ SOLN
INTRAMUSCULAR | Status: AC
  Filled 2020-02-09: qty 2

## 2020-02-09 MED ORDER — FENTANYL CITRATE (PF) 100 MCG/2ML IJ SOLN
INTRAMUSCULAR | Status: AC
  Filled 2020-02-09: qty 2

## 2020-02-09 MED ORDER — INSULIN GLARGINE 100 UNIT/ML ~~LOC~~ SOLN
10.0000 [IU] | Freq: Every day | SUBCUTANEOUS | Status: DC
  Administered 2020-02-09: 10 [IU] via SUBCUTANEOUS
  Filled 2020-02-09 (×2): qty 0.1

## 2020-02-09 MED ORDER — PROPOFOL 1000 MG/100ML IV EMUL
INTRAVENOUS | Status: AC
  Administered 2020-02-09: 17:00:00 30 ug/kg/min via INTRAVENOUS
  Filled 2020-02-09: qty 100

## 2020-02-09 MED ORDER — MIDAZOLAM HCL 2 MG/2ML IJ SOLN
INTRAMUSCULAR | Status: AC
  Filled 2020-02-09: qty 2

## 2020-02-09 MED ORDER — MIDAZOLAM HCL 2 MG/2ML IJ SOLN: 2.0000 mg | Freq: Once | INTRAMUSCULAR | Status: DC

## 2020-02-09 MED ORDER — FENTANYL CITRATE (PF) 100 MCG/2ML IJ SOLN
INTRAMUSCULAR | Status: AC | PRN
  Administered 2020-02-09: 25 ug via INTRAVENOUS

## 2020-02-09 MED ORDER — INSULIN ASPART 100 UNIT/ML ~~LOC~~ SOLN
0.0000 [IU] | SUBCUTANEOUS | Status: DC
  Administered 2020-02-09: 20:00:00 5 [IU] via SUBCUTANEOUS
  Administered 2020-02-09: 23:00:00 8 [IU] via SUBCUTANEOUS
  Administered 2020-02-10 – 2020-02-11 (×7): 5 [IU] via SUBCUTANEOUS
  Administered 2020-02-11: 03:00:00 8 [IU] via SUBCUTANEOUS
  Administered 2020-02-11 (×4): 5 [IU] via SUBCUTANEOUS
  Administered 2020-02-12: 16:00:00 8 [IU] via SUBCUTANEOUS
  Administered 2020-02-12: 04:00:00 5 [IU] via SUBCUTANEOUS
  Administered 2020-02-12: 21:00:00 8 [IU] via SUBCUTANEOUS
  Administered 2020-02-12: 11 [IU] via SUBCUTANEOUS
  Filled 2020-02-09 (×19): qty 1

## 2020-02-09 MED ORDER — ORAL CARE MOUTH RINSE
15.0000 mL | OROMUCOSAL | Status: DC
  Administered 2020-02-09 – 2020-02-14 (×38): 15 mL via OROMUCOSAL

## 2020-02-09 MED ORDER — IODIXANOL 320 MG/ML IV SOLN: 50.0000 mL | Freq: Once | INTRAVENOUS | Status: DC

## 2020-02-09 MED ORDER — HYDROMORPHONE HCL 1 MG/ML IJ SOLN
INTRAMUSCULAR | Status: AC
  Administered 2020-02-09: 15:00:00 1 mg via INTRAVENOUS
  Filled 2020-02-09: qty 1

## 2020-02-09 MED ORDER — MAGNESIUM SULFATE 2 GM/50ML IV SOLN
2.0000 g | Freq: Once | INTRAVENOUS | Status: AC
  Administered 2020-02-09: 20:00:00 2 g via INTRAVENOUS
  Filled 2020-02-09: qty 50

## 2020-02-09 MED ORDER — FENTANYL 2500MCG IN NS 250ML (10MCG/ML) PREMIX INFUSION
INTRAVENOUS | Status: AC
  Administered 2020-02-09: 17:00:00 75 ug/h via INTRAVENOUS
  Filled 2020-02-09: qty 250

## 2020-02-09 MED ORDER — ROCURONIUM BROMIDE 50 MG/5ML IV SOLN: 40.0000 mg | Freq: Once | INTRAVENOUS | Status: AC

## 2020-02-09 MED ORDER — SODIUM CHLORIDE 0.9% FLUSH: 5.0000 mL | Freq: Three times a day (TID) | INTRAVENOUS | Status: DC

## 2020-02-09 MED ORDER — SODIUM CHLORIDE 0.9 % IV SOLN
INTRAVENOUS | Status: DC | PRN
  Administered 2020-02-09 – 2020-02-16 (×2): 250 mL via INTRAVENOUS

## 2020-02-09 MED ORDER — CHLORHEXIDINE GLUCONATE 0.12% ORAL RINSE (MEDLINE KIT)
15.0000 mL | Freq: Two times a day (BID) | OROMUCOSAL | Status: DC
  Administered 2020-02-09 – 2020-02-14 (×9): 15 mL via OROMUCOSAL

## 2020-02-09 MED ORDER — FENTANYL 2500MCG IN NS 250ML (10MCG/ML) PREMIX INFUSION
0.0000 ug/h | INTRAVENOUS | Status: DC
  Administered 2020-02-10: 08:00:00 125 ug/h via INTRAVENOUS
  Administered 2020-02-11: 19:00:00 175 ug/h via INTRAVENOUS
  Administered 2020-02-11: 03:00:00 150 ug/h via INTRAVENOUS
  Administered 2020-02-12: 20:00:00 175 ug/h via INTRAVENOUS
  Administered 2020-02-13: 06:00:00 200 ug/h via INTRAVENOUS
  Filled 2020-02-09 (×6): qty 250

## 2020-02-09 MED ORDER — MIDAZOLAM HCL 2 MG/2ML IJ SOLN
INTRAMUSCULAR | Status: AC | PRN
  Administered 2020-02-09: 0.5 mg via INTRAVENOUS

## 2020-02-09 MED ORDER — PROPOFOL 1000 MG/100ML IV EMUL
5.0000 ug/kg/min | INTRAVENOUS | Status: DC
  Administered 2020-02-09: 20:00:00 50 ug/kg/min via INTRAVENOUS
  Administered 2020-02-09: 23:00:00 55 ug/kg/min via INTRAVENOUS
  Administered 2020-02-10: 16:00:00 60 ug/kg/min via INTRAVENOUS
  Administered 2020-02-10 (×2): 45 ug/kg/min via INTRAVENOUS
  Administered 2020-02-10: 55 ug/kg/min via INTRAVENOUS
  Administered 2020-02-10: 12:00:00 45 ug/kg/min via INTRAVENOUS
  Administered 2020-02-10: 02:00:00 50 ug/kg/min via INTRAVENOUS
  Administered 2020-02-10: 21:00:00 55 ug/kg/min via INTRAVENOUS
  Administered 2020-02-10: 19:00:00 60 ug/kg/min via INTRAVENOUS
  Administered 2020-02-11 (×4): 50 ug/kg/min via INTRAVENOUS
  Administered 2020-02-11: 22:00:00 55 ug/kg/min via INTRAVENOUS
  Administered 2020-02-11: 19:00:00 50 ug/kg/min via INTRAVENOUS
  Administered 2020-02-11 – 2020-02-12 (×3): 55 ug/kg/min via INTRAVENOUS
  Administered 2020-02-12: 06:00:00 50 ug/kg/min via INTRAVENOUS
  Filled 2020-02-09 (×20): qty 100

## 2020-02-09 MED ORDER — FENTANYL CITRATE (PF) 100 MCG/2ML IJ SOLN
INTRAMUSCULAR | Status: AC
  Administered 2020-02-09: 17:00:00 150 ug via INTRAVENOUS
  Filled 2020-02-09: qty 4

## 2020-02-09 MED ORDER — ROCURONIUM BROMIDE 50 MG/5ML IV SOLN
INTRAVENOUS | Status: AC
  Administered 2020-02-09: 17:00:00 40 mg via INTRAVENOUS
  Filled 2020-02-09: qty 1

## 2020-02-09 MED ORDER — HYDROMORPHONE HCL 1 MG/ML IJ SOLN: 1.0000 mg | Freq: Once | INTRAMUSCULAR | Status: AC

## 2020-02-09 MED ORDER — SODIUM CHLORIDE 0.9 % IV BOLUS
1000.0000 mL | Freq: Once | INTRAVENOUS | Status: AC
  Administered 2020-02-09: 15:00:00 1000 mL via INTRAVENOUS

## 2020-02-09 MED ORDER — FENTANYL CITRATE (PF) 100 MCG/2ML IJ SOLN
150.0000 ug | Freq: Once | INTRAMUSCULAR | Status: AC
Start: 2020-02-09 — End: 2020-02-09

## 2020-02-09 NOTE — Consult Note (Signed)
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  He reported in ED that pain is mostly across his chest with radiation to the right arm associated with fever, chills and fatigue.  He complained of nausea but denies having any abdominal pain.  Patient felt that he may have infection with coronavirus but denies having any known exposure.  He denied having any diaphoresis, no palpitations, no shortness of breath but he has felt dizzy and lightheaded.  He denied having any diarrhea, no constipation, no melena stools, no hematochezia, no urinary frequency, no nocturia, no cough, no headache.  He has chronic low back pain but has had multiple back surgeries and stated that there has not been a change in the intensity of his pain.  In the ER he was noted to have a fever with a T-max of 101.4 F, he was tachycardic and tachypneic as well.  Labs show sodium 133, calcium 9.9, chloride 99, bicarbonate 20, glucose 240, BUN 35, creatinine 1.84, calcium 10, anion gap 15, alkaline phosphatase 115, albumin 4.2, lipase 22, AST 901, ALT 1275, total protein 7.6, bilirubin 3.6 troponin 8, lactic acid 3.1, white count 10.9, hemoglobin 15.9 hematocrit 47.3, MCV 82, RDW 13.8, platelet count 212, PT 11.9, INR 0.9  Respiratory viral panel is negative  Gallbladder ultrasound shows gallbladder sludge with findings that may represent sequelae of acalculus cholecystitis CT  scan of abdomen and pelvis showed mild haziness of the gallbladder wall and pericholecystic fat. No calcified gallstones. Ultrasound may provide better evaluation of the gallbladder if there is a concern for acute cholecystitis. 5.6 x 5.9 heterogeneously enhancing mass in the upper pole of the left kidney most concerning for malignancy.  CT angiogram of the chest is negative for pulmonary embolism  Chest x-ray reviewed by me shows clear lungs  Twelve-lead EKG reviewed by me shows sinus tachycardia with a left posterior fascicular block  Patient is a 64 year old patient male who presents to the emergency room for evaluation of chest pain was noted to be febrile, tachycardic and tachypneic with an elevated lactic acid level. White count is slightly elevated at 10.9 and source of sepsis appears to be secondary to acute acalculous cholecystitis.  He will be admitted to the hospital for further evaluation.    Patient was in PACU for cholecystostomy tube placement but continued to deteriorate and required increased O2 as well as decreasing mentation.  During my evaluation patients wife was present and was able to give additional details.  Patient is unresponsive with severe acute hypoxemic respiratory failure and in circulatory shock.   PCCM consultation for likely septic shock with multiorgan failure in acutely comatose state.   Lines/tubes : Biliary Tube RUQ (Active)    Microbiology/Sepsis markers: Results for orders placed or performed during the hospital encounter of 02/07/20  Resp Panel by RT-PCR (Flu A&B, Covid) Nasopharyngeal Swab     Status: None   Collection Time: 02/07/20  5:46 PM   Specimen: Nasopharyngeal Swab; Nasopharyngeal(NP) swabs in vial transport medium  Result Value Ref Range Status   SARS Coronavirus 2 by RT PCR NEGATIVE  NEGATIVE Final    Comment: (NOTE) SARS-CoV-2 target nucleic acids are NOT DETECTED.  The SARS-CoV-2 RNA is generally detectable in upper respiratory specimens  during the acute phase of infection. The lowest concentration of SARS-CoV-2 viral copies this assay can detect is 138 copies/mL. A negative result does not preclude SARS-Cov-2 infection and should not be used as the sole basis for treatment or other patient management decisions. A negative result may occur with  improper specimen collection/handling, submission of specimen other than nasopharyngeal swab, presence of viral mutation(s) within the areas targeted by this assay, and inadequate number of viral copies(<138 copies/mL). A negative result must be combined with clinical observations, patient history, and epidemiological information. The expected result is Negative.  Fact Sheet for Patients:  EntrepreneurPulse.com.au  Fact Sheet for Healthcare Providers:  IncredibleEmployment.be  This test is no t yet approved or cleared by the [place] FDA and  has been authorized for detection and/or diagnosis of SARS-CoV-2 by FDA under an Emergency Use Authorization (EUA). This EUA will remain  in effect (meaning this test can be used) for the duration of the COVID-19 declaration under Section 564(b)(1) of the Act, 21 U.S.C.section 360bbb-3(b)(1), unless the authorization is terminated  or revoked sooner.       Influenza A by PCR NEGATIVE NEGATIVE Final   Influenza B by PCR NEGATIVE NEGATIVE Final    Comment: (NOTE) The Xpert Xpress SARS-CoV-2/FLU/RSV plus assay is intended as an aid in the diagnosis of influenza from Nasopharyngeal swab specimens and should not be used as a sole basis for treatment. Nasal washings and aspirates are unacceptable for Xpert Xpress SARS-CoV-2/FLU/RSV testing.  Fact Sheet for Patients: EntrepreneurPulse.com.au  Fact Sheet for Healthcare Providers: IncredibleEmployment.be  This test is not yet approved or cleared by the [place] FDA and has been authorized for detection  and/or diagnosis of SARS-CoV-2 by FDA under an Emergency Use Authorization (EUA). This EUA will remain in effect (meaning this test can be used) for the duration of the COVID-19 declaration under Section 564(b)(1) of the Act, 21 U.S.C. section 360bbb-3(b)(1), unless the authorization is terminated or revoked.  Performed at [facility], [address] [place], [place] [address]   Culture, blood (Routine X 2) w Reflex to ID Panel     Status: None (Preliminary result)   Collection Time: 02/07/20  5:46 PM   Specimen: BLOOD  Result Value Ref Range Status   Specimen Description BLOOD BLOOD RIGHT FOREARM  Final   Special Requests   Final    BOTTLES DRAWN AEROBIC AND ANAEROBIC Blood Culture adequate volume   Culture   Final    NO GROWTH 2 DAYS Performed at [facility], [address] [place], [place] [address]    Report Status PENDING  Incomplete  Culture, blood (Routine X 2) w Reflex to ID Panel     Status: None (Preliminary result)   Collection Time: 02/07/20  5:46 PM   Specimen: BLOOD  Result Value Ref Range Status   Specimen Description BLOOD LEFT ANTECUBITAL  Final   Special Requests   Final    BOTTLES DRAWN AEROBIC AND ANAEROBIC Blood Culture adequate volume   Culture   Final    NO GROWTH 2 DAYS Performed at [facility], [address], [place], [place] [address]    Report Status PENDING  Incomplete  Urine Culture     Status: Abnormal   Collection Time: 02/07/20  5:46 PM   Specimen: Urine, Random  Result Value Ref Range  Status   Specimen Description   Final    URINE, RANDOM Performed at [facility], [address], [place], [place] [address]    Special Requests   Final    NONE Performed at [facility], [address] [place], [place] [address]    Culture (A)  Final    <10,000 COLONIES/mL INSIGNIFICANT GROWTH Performed at [facility] [address] [place], [place] [address]    Report Status 02/08/2020  FINAL  Final    Anti-infectives:  Anti-infectives (From admission, onward)   Start     Dose/Rate Route Frequency Ordered Stop   02/08/20 1800  cefTRIAXone (ROCEPHIN) 2 g in sodium chloride 0.9 % 100 mL IVPB  Status:  Discontinued        2 g 200 mL/hr over 30 Minutes Intravenous Every 24 hours 02/07/20 2230 02/07/20 2300   02/07/20 2330  piperacillin-tazobactam (ZOSYN) IVPB 3.375 g        3.375 g 12.5 mL/hr over 240 Minutes Intravenous Every 8 hours 02/07/20 2230     02/07/20 2230  metroNIDAZOLE (FLAGYL) IVPB 500 mg  Status:  Discontinued        500 mg 100 mL/hr over 60 Minutes Intravenous  Once 02/07/20 2223 02/07/20 2259   02/07/20 1930  cefTRIAXone (ROCEPHIN) 2 g in sodium chloride 0.9 % 100 mL IVPB        2 g 200 mL/hr over 30 Minutes Intravenous  Once 02/07/20 1918 02/07/20 2101       Consults: Treatment Team:  Ronny Bacon, MD     PAST MEDICAL HISTORY   Past Medical History:  Diagnosis Date  . Acute postoperative pain 12/03/2018  . Allergic rhinitis 12/30/2012  . Anginal pain ([place])   . Bronchitis    hx of  . Can't get food down 08/12/2014  . Chronic back pain    thoracic area  . Concussion 09/2015  . COPD (chronic obstructive pulmonary disease) ([place])   . Coronary artery disease    99% blockage  . DDD (degenerative disc disease), cervical   . Dehydration symptoms    2019  . 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 Dysphagia   . GERD (gastroesophageal reflux disease)   . History of Meniere's disease 12/21/2014  . History of thoracic spine surgery (S/P T9-10 IVD spacer) 12/21/2014  . Hypercholesteremia   . Hyperlipidemia   . Hypertension    sees Dr. Jenny Reichmann walker Jefm Bryant  . 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 Myocardial infarction [facility])    Sees Dr. Drema [place], [place] [facility]  . Neuromuscular disorder ([place])    diabetic neuropathy in feet  . Psychosis ([place])   . Short-segment Barrett's esophagus   . Sleep  apnea    uses cpap and 2 L o2 at hs     SURGICAL HISTORY   Past Surgical History:  Procedure Laterality Date  . APPENDECTOMY    . ARTHRODESIS ANTERIOR ANTERIOR CERVICLE SPINE  01/04/2013  . BACK SURGERY     fusion thoracic area  . CARDIAC CATHETERIZATION     may 2012 and Nov 20, 2010  . CARDIAC CATHETERIZATION N/A 06/15/2015   Procedure: Left Heart Cath and Coronary Angiography;  Surgeon: Corey Skains, MD;  Location: [place] CV LAB;  Service: Cardiovascular;  Laterality: N/A;  . CARDIAC CATHETERIZATION N/A 06/15/2015   Procedure: Coronary Stent Intervention;  Surgeon: Isaias Cowman, MD;  Location: [place] CV LAB;  Service: Cardiovascular;  Laterality: N/A;  . CARDIOVASCULAR STRESS TEST     jan 2014  . COLONOSCOPY WITH PROPOFOL N/A 09/19/2014   Procedure: COLONOSCOPY WITH PROPOFOL;  Surgeon: Manya Silvas, MD;  Location: [facility] ENDOSCOPY;  Service: Endoscopy;  Laterality: N/A;  . CORONARY ANGIOPLASTY     stent placement  . ESOPHAGOGASTRODUODENOSCOPY N/A 09/19/2014   Procedure: ESOPHAGOGASTRODUODENOSCOPY (EGD);  Surgeon: Manya Silvas, MD;  Location: [facility] ENDOSCOPY;  Service: Endoscopy;  Laterality: N/A;  . IR PERC CHOLECYSTOSTOMY  02/09/2020  . [place] right ear  . LUMBAR SPINAL CORD SIMULATOR LEAD REMOVAL Right 08/09/2019   Procedure: REMOVAL SPINAL CORD STIMULATOR PERCUTANEOUS LEADS, REMOVAL PULSE GENERATOR;  Surgeon: [name], MD;  Location: [facility] ORS;  Service: Neurosurgery;  Laterality: Right;  LOCAL WITH MAC  . mastoid shunt     left, 2002, 1997 right ear, 1980 right ear  . PULSE GENERATOR IMPLANT N/A 01/18/2019   Procedure: MEDTRONIC SPINAL CORD STIMULATOR BATTERY EXCHANGE;  Surgeon: [name], MD;  Location: [facility] ORS;  Service: Neurosurgery;  Laterality: N/A;  . SAVORY DILATION N/A 09/19/2014   Procedure: SAVORY DILATION;  Surgeon: Manya Silvas, MD;  Location: [facility] ENDOSCOPY;  Service: Endoscopy;  Laterality: N/A;  . SHOULDER  ARTHROSCOPY WITH SUBACROMIAL DECOMPRESSION Left 04/06/2012   Procedure: SHOULDER ARTHROSCOPY WITH SUBACROMIAL DECOMPRESSION;  Surgeon: Vickey Huger, MD;  Location: [place];  Service: Orthopedics;  Laterality: Left;  left shoulder arthroscopy, subacromial decompression and distal clavicle resection  . SPINAL CORD STIMULATOR IMPLANT Right      FAMILY HISTORY   Family History  Problem Relation Age of Onset  . Heart disease Mother   . Diabetes Mother   . Heart disease Father   . Cancer Sister   . Heart disease Maternal Aunt   . Heart disease Maternal Uncle   . Diabetes Maternal Grandmother   . Diabetes Paternal Grandmother      SOCIAL HISTORY   Social History   Tobacco Use  . Smoking status: Never Smoker  . Smokeless tobacco: Never Used  Vaping Use  . Vaping Use: Never used  Substance Use Topics  . Alcohol use: No  . Drug use: No     MEDICATIONS   Current Medication:  Current Facility-Administered Medications:  .  0.9 %  sodium chloride infusion, , Intravenous, PRN, Shawna Clamp, MD, Last Rate: 10 mL/hr at 02/09/20 0846, 250 mL at 02/09/20 0846 .  acetaminophen (TYLENOL) tablet 1,000 mg, 1,000 mg, Oral, TID PRN, Enzo Bi, MD, 1,000 mg at 02/09/20 0449 .  cyclobenzaprine (FLEXERIL) tablet 10 mg, 10 mg, Oral, TID PRN, Agbata, Tochukwu, MD, 10 mg at 02/09/20 0556 .  enoxaparin (LOVENOX) injection 50 mg, 0.5 mg/kg, Subcutaneous, Q24H, Enzo Bi, MD, 50 mg at 02/08/20 2112 .  fentaNYL (SUBLIMAZE) 100 MCG/2ML injection, , , ,  .  gabapentin (NEURONTIN) capsule 1,600 mg, 1,600 mg, Oral, BID, Agbata, Tochukwu, MD, 1,600 mg at 02/09/20 0845 .  insulin aspart (novoLOG) injection 0-15 Units, 0-15 Units, Subcutaneous, TID WC, Enzo Bi, MD, 8 Units at 02/09/20 1236 .  insulin regular human CONCENTRATED (HUMULIN R) 500 UNIT/ML kwikpen 40 Units, 40 Units, Subcutaneous, TID WC, Enzo Bi, MD .  isosorbide mononitrate (IMDUR) 24 hr tablet 30 mg, 30 mg, Oral, QHS, Agbata, Tochukwu, MD, 30  mg at 02/08/20 2114 .  loratadine (CLARITIN) tablet 10 mg, 10 mg, Oral, Daily, Agbata, Tochukwu, MD, 10 mg at 02/09/20 0845 .  magnesium sulfate IVPB 2 g 50 mL, 2  g, Intravenous, Once, Shawna Clamp, MD .  meclizine (ANTIVERT) tablet 25 mg, 25 mg, Oral, TID PRN, Agbata, Tochukwu, MD .  metoprolol succinate (TOPROL-XL) 24 hr tablet 100 mg, 100 mg, Oral, BID, Agbata, Tochukwu, MD, 100 mg at 02/09/20 0844 .  midazolam (VERSED) 2 MG/2ML injection, , , ,  .  montelukast (SINGULAIR) tablet 10 mg, 10 mg, Oral, QHS, Agbata, Tochukwu, MD, 10 mg at 02/08/20 2113 .  nitroGLYCERIN (NITROSTAT) SL tablet 0.4 mg, 0.4 mg, Sublingual, Q5 min PRN, Agbata, Tochukwu, MD .  ondansetron (ZOFRAN) tablet 4 mg, 4 mg, Oral, Q6H PRN **OR** ondansetron (ZOFRAN) injection 4 mg, 4 mg, Intravenous, Q6H PRN, Agbata, Tochukwu, MD, 4 mg at 02/09/20 1520 .  oxyCODONE (Oxy IR/ROXICODONE) immediate release tablet 10 mg, 10 mg, Oral, Q6H PRN, Enzo Bi, MD, 10 mg at 02/09/20 1158 .  piperacillin-tazobactam (ZOSYN) IVPB 3.375 g, 3.375 g, Intravenous, Q8H, Agbata, Tochukwu, MD, Last Rate: 12.5 mL/hr at 02/09/20 0847, 3.375 g at 02/09/20 0847  Facility-Administered Medications Ordered in Other Encounters:  .  iodixanol (VISIPAQUE) 320 MG/ML injection 50 mL, 50 mL, Other, Once, Suttle, Dylan J, MD .  ondansetron (ZOFRAN) 4 MG/2ML injection, , , ,  .  sodium chloride flush (NS) 0.9 % injection 5 mL, 5 mL, Intracatheter, Q8H, Suttle, Rosanne Ashing, MD    ALLERGIES   Patient has no known allergies.    REVIEW OF SYSTEMS     Unable to obtain patient is unresponsive  PHYSICAL EXAMINATION   Vital Signs: Temp:  [98.4 F (36.9 C)-103.1 F (39.5 C)] 103.1 F (39.5 C) (12/22 1635) Pulse Rate:  [95-122] 122 (12/22 1635) Resp:  [16-42] 42 (12/22 1635) BP: (108-184)/(60-100) 172/75 (12/22 1635) SpO2:  [92 %-100 %] 100 % (12/22 1635) Weight:  [104.3 kg] 104.3 kg (12/22 1354)  GENERAL:age appropriate HEAD: Normocephalic, atraumatic.   EYES: Pupils equal, round, reactive to light.  No scleral icterus.  MOUTH: Moist mucosal membrane. NECK: Supple. No thyromegaly. No nodules. No JVD.  PULMONARY: decreased air entry bilateraly  CARDIOVASCULAR: S1 and S2. Regular rate and rhythm. No murmurs, rubs, or gallops.  GASTROINTESTINAL: Soft, nontender, non-distended. No masses. Positive bowel sounds. No hepatosplenomegaly.  MUSCULOSKELETAL: No swelling, clubbing, or edema.  NEUROLOGIC: encephalopathic GCS7 SKIN:intact,warm,dry   PERTINENT DATA     Infusions: . sodium chloride 250 mL (02/09/20 0846)  . magnesium sulfate bolus IVPB    . piperacillin-tazobactam (ZOSYN)  IV 3.375 g (02/09/20 0847)   Scheduled Medications: . enoxaparin (LOVENOX) injection  0.5 mg/kg Subcutaneous Q24H  . fentaNYL      . gabapentin  1,600 mg Oral BID  . insulin aspart  0-15 Units Subcutaneous TID WC  . insulin regular human CONCENTRATED  40 Units Subcutaneous TID WC  . isosorbide mononitrate  30 mg Oral QHS  . loratadine  10 mg Oral Daily  . metoprolol succinate  100 mg Oral BID  . midazolam      . montelukast  10 mg Oral QHS   PRN Medications: sodium chloride, acetaminophen, cyclobenzaprine, meclizine, nitroGLYCERIN, ondansetron **OR** ondansetron (ZOFRAN) IV, oxyCODONE Hemodynamic parameters:   Intake/Output: 12/21 0701 - 12/22 0700 In: 164.1 [IV Piggyback:164.1] Out: -   Ventilator  Settings:      LAB RESULTS:  Basic Metabolic Panel: Recent Labs  Lab 02/07/20 1701 02/08/20 0424 02/09/20 0604  NA 133* 136 137  K 5.9* 4.5 4.5  CL 98 103 104  CO2 20* 24 21*  GLUCOSE 240* 252* 328*  BUN 35* 26* 21  CREATININE  1.84* 1.40* 1.22  CALCIUM 10.0 9.4 9.5  MG  --   --  1.6*   Liver Function Tests: Recent Labs  Lab 02/07/20 1701 02/08/20 0424 02/09/20 0604  AST 901* 359* 213*  ALT 1,275* 731* 488*  ALKPHOS 150* 128* 198*  BILITOT 5.0* 4.5* 4.0*  PROT 7.6 6.1* 6.5  ALBUMIN 4.2 3.1* 3.1*   Recent Labs  Lab  02/07/20 1701  LIPASE 22   Recent Labs  Lab 02/07/20 2300  AMMONIA 24   CBC: Recent Labs  Lab 02/07/20 1701 02/08/20 0424 02/09/20 0604  WBC 10.9* 8.9 7.6  HGB 15.9 13.2 12.6*  HCT 47.3 39.3 38.0*  MCV 82.4 82.6 82.6  PLT 212 141* 147*   Cardiac Enzymes: No results for input(s): CKTOTAL, CKMB, CKMBINDEX, TROPONINI in the last 168 hours. BNP: Invalid input(s): POCBNP CBG: Recent Labs  Lab 02/08/20 2314 02/09/20 0744 02/09/20 1222 02/09/20 1358 02/09/20 1540  GLUCAP 254* 314* 259* 267* 225*       IMAGING RESULTS:  Imaging: DG Chest 1 View  Result Date: 02/07/2020 CLINICAL DATA:  Shortness of breath and chest pain. EXAM: CHEST  1 VIEW COMPARISON:  02/06/2020 FINDINGS: Stable cardiomediastinal contours. The lung volumes are low. No pleural effusion or edema. No airspace densities identified. Review of the visualized osseous structures is unremarkable. IMPRESSION: 1. Lungs are suboptimally inflated but clear. Electronically Signed   By: Kerby Moors M.D.   On: 02/07/2020 17:49   CT Angio Chest PE W and/or Wo Contrast  Result Date: 02/07/2020 CLINICAL DATA:  64 year old male with abdominal pain and fever. EXAM: CT ANGIOGRAPHY CHEST CT ABDOMEN AND PELVIS WITH CONTRAST TECHNIQUE: Multidetector CT imaging of the chest was performed using the standard protocol during bolus administration of intravenous contrast. Multiplanar CT image reconstructions and MIPs were obtained to evaluate the vascular anatomy. Multidetector CT imaging of the abdomen and pelvis was performed using the standard protocol during bolus administration of intravenous contrast. CONTRAST:  54m OMNIPAQUE IOHEXOL 350 MG/ML SOLN COMPARISON:  Chest radiograph dated 02/07/2020 and CT of the abdomen pelvis dated 05/07/2007. FINDINGS: CTA CHEST FINDINGS Cardiovascular: Top-normal cardiac size. No pericardial effusion. Three-vessel coronary vascular calcification. The thoracic aorta is unremarkable. The origins  of the great vessels of the aortic arch appear patent. Evaluation of the pulmonary arteries is somewhat limited due to respiratory motion artifact and suboptimal visualization of the peripheral branches. No central pulmonary artery embolus identified. Mediastinum/Nodes: No hilar or mediastinal adenopathy. The esophagus and the thyroid gland are grossly unremarkable. No mediastinal fluid collection. Lungs/Pleura: There are bibasilar subpleural atelectasis/scarring. No focal consolidation, pleural effusion, pneumothorax. The central airways are patent. Musculoskeletal: T8-T9 disc spacer. No acute osseous pathology. Review of the MIP images confirms the above findings. CT ABDOMEN and PELVIS FINDINGS No intra-abdominal free air or free fluid. Hepatobiliary: Fatty liver. No intrahepatic biliary dilatation. There is mild haziness of the gallbladder wall and pericholecystic fat. No calcified gallstone. Ultrasound may provide better evaluation of the gallbladder if there is clinical concern for acute cholecystitis. Pancreas: Unremarkable. No pancreatic ductal dilatation or surrounding inflammatory changes. Spleen: Normal in size without focal abnormality. Adrenals/Urinary Tract: The adrenal glands unremarkable. There is a 5.6 x 5.9 cm heterogeneously enhancing mass in the upper pole of the left kidney most concerning for malignancy. Further characterization with renal mass protocol MRI is recommended. Additional 2 cm hypodense lesion in the upper pole of the left kidney laterally is not characterized but possibly a cyst. There is no hydronephrosis on either side.  There is symmetric enhancement and excretion of contrast by both kidneys. The visualized ureters and urinary bladder appear unremarkable. Stomach/Bowel: There is no bowel obstruction or active inflammation. Appendectomy. Vascular/Lymphatic: Mild aortoiliac atherosclerotic disease. The IVC is unremarkable. No portal venous gas. There is no adenopathy. Reproductive:  The prostate and seminal vesicles are grossly unremarkable. No pelvic mass. Other: None Musculoskeletal: No acute or significant osseous findings. Review of the MIP images confirms the above findings. IMPRESSION: 1. No acute intrathoracic pathology. No CT evidence of central pulmonary artery embolus. 2. Mild haziness of the gallbladder wall and pericholecystic fat. No calcified gallstone. Ultrasound may provide better evaluation of the gallbladder if there is clinical concern for acute cholecystitis. 3. A 5.6 x 5.9 cm heterogeneously enhancing mass in the upper pole of the left kidney most concerning for malignancy. Further characterization with renal mass protocol MRI is recommended. 4. Fatty liver. 5. No bowel obstruction. 6. Aortic Atherosclerosis (ICD10-I70.0). Electronically Signed   By: Anner Crete M.D.   On: 02/07/2020 20:37   NM Hepatobiliary Liver Func  Result Date: 02/08/2020 CLINICAL DATA:  Chronic upper abdominal pain assess gallbladder motility EXAM: NUCLEAR MEDICINE HEPATOBILIARY IMAGING TECHNIQUE: Sequential images of the abdomen were obtained out to 60 minutes following intravenous administration of radiopharmaceutical. RADIOPHARMACEUTICALS:  5.2 mCi Tc-63m Choletec IV COMPARISON:  02/07/2020 ultrasound abdomen and CT abdomen/pelvis exams FINDINGS: Delayed bloodstream clearance of tracer by liver indicating hepatocellular dysfunction. Prolonged hepatocellular retention of tracer. At 1 hour, no excretion of tracer into biliary tree. At 2 hours, no excretion of tracer into biliary tree. Static image at 4 hours shows minimal tracer in small bowel loops in the LEFT mid abdomen indicating at least partial patency of the CBD. Gallbladder did not visualized by 4 hours. However, because of the severe degree of hepatocellular dysfunction and poor excretion of tracer, unable to assess patency of the cystic duct to exclude cholecystitis. Patient refused imaging beyond 4 hours. IMPRESSION: Severe  hepatocellular dysfunction with marked retention of tracer within hepatic parenchyma at 4 hours. Only minimal tracer is seen within small bowel loops in the LEFT mid abdomen at 4 hours, indicating at least partial patency of CBD. Unable to assess patency of cystic duct, acute cholecystitis, or gallbladder emptying due to the severe degree of hepatocellular dysfunction. Electronically Signed   By: MLavonia DanaM.D.   On: 02/08/2020 16:00   CT ABDOMEN PELVIS W CONTRAST  Result Date: 02/07/2020 CLINICAL DATA:  64year old male with abdominal pain and fever. EXAM: CT ANGIOGRAPHY CHEST CT ABDOMEN AND PELVIS WITH CONTRAST TECHNIQUE: Multidetector CT imaging of the chest was performed using the standard protocol during bolus administration of intravenous contrast. Multiplanar CT image reconstructions and MIPs were obtained to evaluate the vascular anatomy. Multidetector CT imaging of the abdomen and pelvis was performed using the standard protocol during bolus administration of intravenous contrast. CONTRAST:  736mOMNIPAQUE IOHEXOL 350 MG/ML SOLN COMPARISON:  Chest radiograph dated 02/07/2020 and CT of the abdomen pelvis dated 05/07/2007. FINDINGS: CTA CHEST FINDINGS Cardiovascular: Top-normal cardiac size. No pericardial effusion. Three-vessel coronary vascular calcification. The thoracic aorta is unremarkable. The origins of the great vessels of the aortic arch appear patent. Evaluation of the pulmonary arteries is somewhat limited due to respiratory motion artifact and suboptimal visualization of the peripheral branches. No central pulmonary artery embolus identified. Mediastinum/Nodes: No hilar or mediastinal adenopathy. The esophagus and the thyroid gland are grossly unremarkable. No mediastinal fluid collection. Lungs/Pleura: There are bibasilar subpleural atelectasis/scarring. No focal consolidation, pleural effusion,  pneumothorax. The central airways are patent. Musculoskeletal: T8-T9 disc spacer. No acute  osseous pathology. Review of the MIP images confirms the above findings. CT ABDOMEN and PELVIS FINDINGS No intra-abdominal free air or free fluid. Hepatobiliary: Fatty liver. No intrahepatic biliary dilatation. There is mild haziness of the gallbladder wall and pericholecystic fat. No calcified gallstone. Ultrasound may provide better evaluation of the gallbladder if there is clinical concern for acute cholecystitis. Pancreas: Unremarkable. No pancreatic ductal dilatation or surrounding inflammatory changes. Spleen: Normal in size without focal abnormality. Adrenals/Urinary Tract: The adrenal glands unremarkable. There is a 5.6 x 5.9 cm heterogeneously enhancing mass in the upper pole of the left kidney most concerning for malignancy. Further characterization with renal mass protocol MRI is recommended. Additional 2 cm hypodense lesion in the upper pole of the left kidney laterally is not characterized but possibly a cyst. There is no hydronephrosis on either side. There is symmetric enhancement and excretion of contrast by both kidneys. The visualized ureters and urinary bladder appear unremarkable. Stomach/Bowel: There is no bowel obstruction or active inflammation. Appendectomy. Vascular/Lymphatic: Mild aortoiliac atherosclerotic disease. The IVC is unremarkable. No portal venous gas. There is no adenopathy. Reproductive: The prostate and seminal vesicles are grossly unremarkable. No pelvic mass. Other: None Musculoskeletal: No acute or significant osseous findings. Review of the MIP images confirms the above findings. IMPRESSION: 1. No acute intrathoracic pathology. No CT evidence of central pulmonary artery embolus. 2. Mild haziness of the gallbladder wall and pericholecystic fat. No calcified gallstone. Ultrasound may provide better evaluation of the gallbladder if there is clinical concern for acute cholecystitis. 3. A 5.6 x 5.9 cm heterogeneously enhancing mass in the upper pole of the left kidney most  concerning for malignancy. Further characterization with renal mass protocol MRI is recommended. 4. Fatty liver. 5. No bowel obstruction. 6. Aortic Atherosclerosis (ICD10-I70.0). Electronically Signed   By: Anner Crete M.D.   On: 02/07/2020 20:37   IR Perc Cholecystostomy  Result Date: 02/09/2020 INDICATION: 64 year old male with acute cholecystitis, poor operative candidate. EXAM: Fluoroscopic and ultrasound-guided placement of cholecystostomy tube MEDICATIONS: The patient is receiving intravenous Zosyn as an inpatient. ANESTHESIA/SEDATION: Moderate (conscious) sedation was employed during this procedure. A total of Versed 0.5 mg and Fentanyl 25 mcg was administered intravenously. Moderate Sedation Time: 10 minutes. The patient's level of consciousness and vital signs were monitored continuously by radiology nursing throughout the procedure under my direct supervision. FLUOROSCOPY TIME:  Fluoroscopy Time: 0.7 minutes (14 mGy). COMPLICATIONS: None immediate. PROCEDURE: Informed written consent was obtained from the patient after a thorough discussion of the procedural risks, benefits and alternatives. All questions were addressed. Maximal Sterile Barrier Technique was utilized including caps, mask, sterile gowns, sterile gloves, sterile drape, hand hygiene and skin antiseptic. A timeout was performed prior to the initiation of the procedure. The patient was placed supine on the angiographic table. The patient's right upper quadrant was then prepped and draped in normal sterile fashion with maximum sterile barrier. Ultrasound demonstrates a distended gallbladder. Subdermal Local anesthesia was provided at the planned skin entry site. Under ultrasound guidance, deeper local anesthetic was provided through intercostal muscles and along the liver capsule. Ultrasound was used to puncture the gallbladder using a 21 gauge Chiba needle via a transhepatic approach. A 0.018 inch wire was advanced into the lumen and  a transition dilator placed. A gentle hand injection of contrast was performed. Cholecystogram demonstrates a distended gallbladder without definite evidence of gallstones. The cystic duct is obstructed. A 0.035 inch exchange wire  was placed in the tract was dilated. A 10.2 French multipurpose drainage catheter was advanced into the gallbladder lumen. The drain was then secured in place using a 0-silk suture and a Stayfix device. A sterile dressing was applied. The tube was placed to bag drainage. A culture was sent to the lab for analysis. The patient tolerated procedure well without evidence of immediate complication was transferred back to the floor in stable condition. IMPRESSION: Successful placement of a 10.2 French percutaneous, transhepatic cholecystostomy tube. PLAN: Return in 6 to 8 weeks for routine cholecystostomy tube check and change pending interim surgical intervention. Ruthann Cancer, MD [facility] Radiology Electronically Signed   By: Ruthann Cancer MD   On: 02/09/2020 15:34   US ABDOMEN LIMITED RUQ (LIVER/GB)  Result Date: 02/07/2020 CLINICAL DATA:  Right upper quadrant pain x2 days. EXAM: ULTRASOUND ABDOMEN LIMITED RIGHT UPPER QUADRANT COMPARISON:  None. FINDINGS: Gallbladder: Echogenic sludge is seen within the gallbladder lumen, without evidence of gallstones. Gallbladder wall thickening is noted (3.3 mm). A trace amount of pericholecystic fluid is also seen. No sonographic Murphy sign noted by sonographer. Common bile duct: Diameter: 3.6 mm Liver: No focal lesion identified. There is diffusely increased echogenicity of the liver parenchyma. Portal vein is patent on color Doppler imaging with normal direction of blood flow towards the liver. Other: None. IMPRESSION: Gallbladder sludge with additional findings that may represent sequelae associated with acalculous cholecystitis. Further evaluation with a nuclear medicine hepatobiliary scan is  recommended. Electronically Signed   By: Virgina Norfolk M.D.   On: 02/07/2020 21:46   _0 @ IR Perc Cholecystostomy  Result Date: 02/09/2020 INDICATION: 64 year old male with acute cholecystitis, poor operative candidate. EXAM: Fluoroscopic and ultrasound-guided placement of cholecystostomy tube MEDICATIONS: The patient is receiving intravenous Zosyn as an inpatient. ANESTHESIA/SEDATION: Moderate (conscious) sedation was employed during this procedure. A total of Versed 0.5 mg and Fentanyl 25 mcg was administered intravenously. Moderate Sedation Time: 10 minutes. The patient's level of consciousness and vital signs were monitored continuously by radiology nursing throughout the procedure under my direct supervision. FLUOROSCOPY TIME:  Fluoroscopy Time: 0.7 minutes (14 mGy). COMPLICATIONS: None immediate. PROCEDURE: Informed written consent was obtained from the patient after a thorough discussion of the procedural risks, benefits and alternatives. All questions were addressed. Maximal Sterile Barrier Technique was utilized including caps, mask, sterile gowns, sterile gloves, sterile drape, hand hygiene and skin antiseptic. A timeout was performed prior to the initiation of the procedure. The patient was placed supine on the angiographic table. The patient's right upper quadrant was then prepped and draped in normal sterile fashion with maximum sterile barrier. Ultrasound demonstrates a distended gallbladder. Subdermal Local anesthesia was provided at the planned skin entry site. Under ultrasound guidance, deeper local anesthetic was provided through intercostal muscles and along the liver capsule. Ultrasound was used to puncture the gallbladder using a 21 gauge Chiba needle via a transhepatic approach. A 0.018 inch wire was advanced into the lumen and a transition dilator placed. A gentle hand injection of contrast was performed. Cholecystogram demonstrates a distended gallbladder without definite  evidence of gallstones. The cystic duct is obstructed. A 0.035 inch exchange wire was placed in the tract was dilated. A 10.2 French multipurpose drainage catheter was advanced into the gallbladder lumen. The drain was then secured in place using a 0-silk suture and a Stayfix device. A sterile dressing was applied. The tube was placed to bag drainage. A culture was sent to the lab for analysis. The patient  tolerated procedure well without evidence of immediate complication was transferred back to the floor in stable condition. IMPRESSION: Successful placement of a 10.2 French percutaneous, transhepatic cholecystostomy tube. PLAN: Return in 6 to 8 weeks for routine cholecystostomy tube check and change pending interim surgical intervention. Ruthann Cancer, MD [facility] Radiology Electronically Signed   By: Ruthann Cancer MD   On: 02/09/2020 15:34      ASSESSMENT AND PLAN    -Multidisciplinary rounds held today    Acutely comatose state    - likely due to septic encephalopathy     - discussed with wife at bedside      - patient will need to have airway secured and treat underlying cause     - CT head when more stable -correct electrolytes -empiric abx -lactate  -procalcitonin -neurology evaluation when sepsis treated -consider LP   Acute cholecystitis     -surgery on case - spoke with Thedore Mins - appreciate input  -s/p cholecystostomy tube - frank pus and some serosang active draining  - empiric Zosyn IV ICU monitoring   Acute Renal Failure-stage 2 most likely due to septic nephropathy  resolving -follow chem 7 -follow UO -continue Foley Catheter-assess need daily   Transaminitis  due to acute cholecysititis    - hepatitis panel negative  - intubated and sedated - minimal sedation to achieve a RASS goal: -1 Wake up assessment pending   Sepsis    -present on admission    - due to intraabdominal infection - acute  cholecystitis -use vasopressors to keep MAP>65 -follow ABG and LA -follow up cultures -emperic ABX -consider stress dose steroids   ID -continue IV abx as prescibed -follow up cultures  GI/Nutrition GI PROPHYLAXIS as indicated DIET-->TF's as tolerated Constipation protocol as indicated  ENDO - ICU hypoglycemic\Hyperglycemia protocol -check FSBS per protocol   ELECTROLYTES -follow labs as needed -replace as needed -pharmacy consultation   DVT/GI PRX ordered -SCDs  TRANSFUSIONS AS NEEDED MONITOR FSBS ASSESS the need for LABS as needed   Critical care provider statement:    Critical care time (minutes):  109   Critical care time was exclusive of:  Separately billable procedures and treating other patients   Critical care was necessary to treat or prevent imminent or life-threatening deterioration of the following conditions:  acute cholecystitis, acutely comatose, encephalopathy, sepsis, transaminits, multiple comorbid conditions   Critical care was time spent personally by me on the following activities:  Development of treatment plan with patient or surrogate, discussions with consultants, evaluation of patient's response to treatment, examination of patient, obtaining history from patient or surrogate, ordering and performing treatments and interventions, ordering and review of laboratory studies and re-evaluation of patient's condition.  I assumed direction of critical care for this patient from another provider in my specialty: no    This document was prepared using Dragon voice recognition software and may include unintentional dictation errors.    Ottie Glazier, M.D.  Division of [place]

## 2020-02-09 NOTE — Procedures (Signed)
Interventional Radiology Procedure Note  Procedure: Cholecystostomy tube placement  Findings: Please refer to procedural dictation for full description.  10.2 Fr cholecystostomy via transhepatic route.  Purulent aspirate, sample sent to lab for culture.    Complications: None immediate  Estimated Blood Loss: < 5 mL  Recommendations: Keep to bag drainage. Follow up culture results. Return to IR on outpatient basis in 6-8 weeks for routine cholecystostomy tube check/change pending interim surgical intervention.   Ruthann Cancer, MD Pager: [phone]

## 2020-02-09 NOTE — Procedures (Signed)
Endotracheal Intubation: Patient required placement of an artificial airway secondary to Respiratory Failure  Consent: Emergent.   Hand washing performed prior to starting the procedure.   Medications administered for sedation prior to procedure:  Midazolam 2 mg IV,  Rocuronium 10 mg IV, Fentanyl 150 mcg IV.    A time out procedure was called and correct patient, name, & ID confirmed. Needed supplies and equipment were assembled and checked to include ETT, 10 ml syringe, Glidescope, Mac and Miller blades, suction, oxygen and bag mask valve, end tidal CO2 monitor.   Patient was positioned to align the mouth and pharynx to facilitate visualization of the glottis.   Heart rate, SpO2 and blood pressure was continuously monitored during the procedure. Pre-oxygenation was conducted prior to intubation and endotracheal tube was placed through the vocal cords into the trachea.     The artificial airway was placed under direct visualization via glidescope route using a 8.0 ETT on the first attempt.  ETT was secured at 25 cm mark.  Placement was confirmed by auscuitation of lungs with good breath sounds bilaterally and no stomach sounds.  Condensation was noted on endotracheal tube.   Pulse ox 98%.  CO2 detector in place with appropriate color change.   Complications: None .    Chest radiograph ordered and pending.   Comments: OGT placed via glidescope.   Ottie Glazier, M.D.  [facility] [place]

## 2020-02-09 NOTE — ED Notes (Signed)
Pt is requesting additional medication that is a home med for pt, secure chat sent to hospitalist

## 2020-02-09 NOTE — Progress Notes (Signed)
[facility] SURGICAL PROGRESS NOTE (cpt [phone])  Hospital Day(s): 2.   Interval History: Patient seen and examined, no acute events or new complaints overnight. Patient reports that he has continued to have RUQ pain this morning which is slightly worse than yesterday. He denied any fever, chills, nausea, emesis. He remains without leukocytosis, WBC is 7.6K. Renal function remains normal, sCr - 1.22. He continues to have persistent LFT elevation and hyperbilirubinemia to 4.0. He did undergo HIDA scan yesterday (12/21) which showed severe hepatobiliary dysfunction and was unable to reliably assess cystic duct.   Review of Systems:  Constitutional: denies fever, chills  HEENT: denies cough or congestion  Respiratory: denies any shortness of breath  Cardiovascular: denies chest pain or palpitations  Gastrointestinal: + abdominal pain, denied N/V, or diarrhea/and bowel function as per interval history Genitourinary: denies burning with urination or urinary frequency   Vital signs in last 24 hours: [min-max] current  Temp:  [98.4 F (36.9 C)-98.6 F (37 C)] 98.4 F (36.9 C) (12/22 0742) Pulse Rate:  [91-108] 104 (12/22 0742) Resp:  [16-35] 22 (12/22 0742) BP: (107-154)/(47-76) 154/75 (12/22 0750) SpO2:  [92 %-98 %] 94 % (12/22 0742)     Height: 5' 9"  (175.3 cm) Weight: 102.1 kg BMI (Calculated): 33.21   Intake/Output last 2 shifts:  12/21 0701 - 12/22 0700 In: 164.1 [IV Piggyback:164.1] Out: -    Physical Exam:  Constitutional: alert, cooperative and no distress  HENT: normocephalic without obvious abnormality  Eyes: Sclera are mildly icteric  Respiratory: breathing non-labored at rest  Cardiovascular: tachycardic, sinus rhythm  Gastrointestinal: Abdomen is protuberant consistent with obesity, soft, RUQ soreness, no rebound, guarding. Musculoskeletal: no edema or wounds, motor and sensation grossly intact, NT    Labs:  CBC Latest Ref Rng & Units 02/09/2020  02/08/2020 02/07/2020  WBC 4.0 - 10.5 K/uL 7.6 8.9 10.9(H)  Hemoglobin 13.0 - 17.0 g/dL 12.6(L) 13.2 15.9  Hematocrit 39.0 - 52.0 % 38.0(L) 39.3 47.3  Platelets 150 - 400 K/uL 147(L) 141(L) 212   CMP Latest Ref Rng & Units 02/09/2020 02/08/2020 02/07/2020  Glucose 70 - 99 mg/dL 328(H) 252(H) 240(H)  BUN 8 - 23 mg/dL 21 26(H) 35(H)  Creatinine 0.61 - 1.24 mg/dL 1.22 1.40(H) 1.84(H)  Sodium 135 - 145 mmol/L 137 136 133(L)  Potassium 3.5 - 5.1 mmol/L 4.5 4.5 5.9(H)  Chloride 98 - 111 mmol/L 104 103 98  CO2 22 - 32 mmol/L 21(L) 24 20(L)  Calcium 8.9 - 10.3 mg/dL 9.5 9.4 10.0  Total Protein 6.5 - 8.1 g/dL 6.5 6.1(L) 7.6  Total Bilirubin 0.3 - 1.2 mg/dL 4.0(H) 4.5(H) 5.0(H)  Alkaline Phos 38 - 126 U/L 198(H) 128(H) 150(H)  AST 15 - 41 U/L 213(H) 359(H) 901(H)  ALT 0 - 44 U/L 488(H) 731(H) 1,275(H)    Imaging studies: No new pertinent imaging studies   Assessment/Plan: (ICD-10's: K81.0) 64 y.o. male with persistent RUQ abdominal pain and improved, but persistent LFT elevation and hyperbilirubinemia with clinical suspicion for acalculous cholecystitis and possible underlying liver disease vs biliary obstruction, complicated by pertinent comorbidities including significant cardiovascular disease, CAD s/p PCI.   - Given his persistent abdominal pain, persistent hyperbilirubinemia, and unreliable HIDA scan, I do think it is in his best interest to undergo MRCP today to rule out any biliary obstruction (ex; choledocholithiasis) vs worsening infection (ex: cholangitis, cholecystitis). He is understanding of this and agreeable.   - Agree with Abx (Zosyn); follow up BCx   - He may still require percutaneous  cholecystostomy tube placement given his persistent pain; however, we can hold off for now pending MRCP results.    - IVF Resuscitation             - Monitor abdominal examination             - Pain control prn; antiemetics prn              - Monitor LFTs; hyperbilirubinemia --> appreciate GI  assistance; hold hepatoxic medications  - Further management per primary service; we will follow   All of the above findings and recommendations were discussed with the patient, and the medical team, and all of patient's questions were answered to his expressed satisfaction.  -- Edison Simon, PA-C [facility] 02/09/2020, 7:58 AM [phone] M-F: 7am - 4pm

## 2020-02-09 NOTE — Consult Note (Signed)
Chief Complaint: Patient was seen in consultation today for acute cholecystitis  Referring Physician(s): Ronny Bacon, MD  Patient Status: [facility] - In-pt  History of Present Illness: Kenneth Osborn is a 64 y.o. male who presented to the [facility] ED on 02/07/20 with chest pain, fever, chills, and fatigue.  He was admitted for further evaluation and found to have equivocal CT and ultrasound findings of acute cholecystitis, with mild leukocytosis on admission, transaminitis, and hyperbilirubinemia.  Since admission, chest pain as resolved and is now localized to the right upper quadrant.  He has remained afebrile without resolution of leukocytosis.  Given strong clinical suspicion for acute cholecystitis and poor operative candidacy due to multiple comorbidities including coronary artery disease, COPD, percutaneous cholecystostomy tube placement has been requested.   Past Medical History:  Diagnosis Date  . Acute postoperative pain 12/03/2018  . Allergic rhinitis 12/30/2012  . Anginal pain ([place])   . Bronchitis    hx of  . Can't get food down 08/12/2014  . Chronic back pain    thoracic area  . Concussion 09/2015  . COPD (chronic obstructive pulmonary disease) ([place])   . Coronary artery disease    99% blockage  . DDD (degenerative disc disease), cervical   . Dehydration symptoms    2019  . 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 Dysphagia   . GERD (gastroesophageal reflux disease)   . History of Meniere's disease 12/21/2014  . History of thoracic spine surgery (S/P T9-10 IVD spacer) 12/21/2014  . Hypercholesteremia   . Hyperlipidemia   . Hypertension    sees Dr. Jenny Reichmann walker Jefm Bryant  . 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 Myocardial infarction [facility])    Sees Dr. Drema [place], [place] [facility]  . Neuromuscular disorder ([place])    diabetic neuropathy in feet  . Psychosis ([place])   . Short-segment Barrett's esophagus   . Sleep apnea     uses cpap and 2 L o2 at hs    Past Surgical History:  Procedure Laterality Date  . APPENDECTOMY    . ARTHRODESIS ANTERIOR ANTERIOR CERVICLE SPINE  01/04/2013  . BACK SURGERY     fusion thoracic area  . CARDIAC CATHETERIZATION     may 2012 and Nov 20, 2010  . CARDIAC CATHETERIZATION N/A 06/15/2015   Procedure: Left Heart Cath and Coronary Angiography;  Surgeon: Corey Skains, MD;  Location: [place] CV LAB;  Service: Cardiovascular;  Laterality: N/A;  . CARDIAC CATHETERIZATION N/A 06/15/2015   Procedure: Coronary Stent Intervention;  Surgeon: Isaias Cowman, MD;  Location: [place] CV LAB;  Service: Cardiovascular;  Laterality: N/A;  . CARDIOVASCULAR STRESS TEST     jan 2014  . COLONOSCOPY WITH PROPOFOL N/A 09/19/2014   Procedure: COLONOSCOPY WITH PROPOFOL;  Surgeon: Manya Silvas, MD;  Location: [facility] ENDOSCOPY;  Service: Endoscopy;  Laterality: N/A;  . CORONARY ANGIOPLASTY     stent placement  . ESOPHAGOGASTRODUODENOSCOPY N/A 09/19/2014   Procedure: ESOPHAGOGASTRODUODENOSCOPY (EGD);  Surgeon: Manya Silvas, MD;  Location: [facility] ENDOSCOPY;  Service: Endoscopy;  Laterality: N/A;  . [place] right ear  . LUMBAR SPINAL CORD SIMULATOR LEAD REMOVAL Right 08/09/2019   Procedure: REMOVAL SPINAL CORD STIMULATOR PERCUTANEOUS LEADS, REMOVAL PULSE GENERATOR;  Surgeon: [name], MD;  Location: [facility] ORS;  Service: Neurosurgery;  Laterality: Right;  LOCAL WITH MAC  . mastoid shunt     left, 2002, 1997 right ear, 1980 right  ear  . PULSE GENERATOR IMPLANT N/A 01/18/2019   Procedure: MEDTRONIC SPINAL CORD STIMULATOR BATTERY EXCHANGE;  Surgeon: [name], MD;  Location: [facility] ORS;  Service: Neurosurgery;  Laterality: N/A;  . SAVORY DILATION N/A 09/19/2014   Procedure: SAVORY DILATION;  Surgeon: Manya Silvas, MD;  Location: [facility] ENDOSCOPY;  Service: Endoscopy;  Laterality: N/A;  . SHOULDER ARTHROSCOPY WITH SUBACROMIAL DECOMPRESSION Left 04/06/2012   Procedure:  SHOULDER ARTHROSCOPY WITH SUBACROMIAL DECOMPRESSION;  Surgeon: Vickey Huger, MD;  Location: [place];  Service: Orthopedics;  Laterality: Left;  left shoulder arthroscopy, subacromial decompression and distal clavicle resection  . SPINAL CORD STIMULATOR IMPLANT Right     Allergies: Patient has no known allergies.  Medications: Prior to Admission medications   Medication Sig Start Date End Date Taking? Authorizing Provider  ALPRAZolam Duanne Moron) 0.5 MG tablet Take 0.5 mg by mouth at bedtime.   Yes [provider]  cetirizine (ZYRTEC) 10 MG tablet Take 10 mg by mouth daily.    Yes [provider]  cyclobenzaprine (FLEXERIL) 10 MG tablet Take 1 tablet (10 mg total) by mouth 3 (three) times daily as needed for muscle spasms. 12/21/19 03/20/20 Yes Milinda Pointer, MD  diazepam (VALIUM) 5 MG tablet Take 5 mg by mouth 3 (three) times daily as needed (Meniere's disease).    Yes [provider]  diltiazem (CARDIZEM CD) 240 MG 24 hr capsule Take 240 mg by mouth at bedtime.  03/30/18  Yes [provider]  enalapril (VASOTEC) 10 MG tablet Take 10 mg by mouth 2 (two) times daily.  07/28/18  Yes [provider]  esomeprazole (NEXIUM) 40 MG capsule Take 40 mg by mouth 2 (two) times daily.   Yes [provider]  ezetimibe (ZETIA) 10 MG tablet Take 10 mg by mouth at bedtime.  03/26/17  Yes [provider]  gabapentin (NEURONTIN) 800 MG tablet Take 2 tablets (1,600 mg total) by mouth 2 (two) times daily. 12/21/19 03/20/20 Yes Milinda Pointer, MD  HUMULIN R U-500 KWIKPEN 500 UNIT/ML kwikpen Inject 40 Units into the skin 3 (three) times daily with meals. Patient taking differently: Inject 70-80 Units into the skin See admin instructions. Can adjust according to blood sugar Inject 80 units with breakfast, 80 units with lunch, 70 units with dinner 01/19/18  Yes Max Sane, MD  isosorbide mononitrate (IMDUR) 30 MG 24 hr tablet Take 30 mg by mouth at bedtime.   Yes  [provider]  Magnesium Oxide 500 MG TABS Take 1 tablet (500 mg total) by mouth daily. 12/21/19 03/20/20 Yes Milinda Pointer, MD  meclizine (ANTIVERT) 25 MG tablet Take 25 mg by mouth 3 (three) times daily as needed for dizziness.  07/06/15  Yes [provider]  metFORMIN (GLUCOPHAGE) 1000 MG tablet Take 1,000 mg by mouth 2 (two) times daily with a meal. 03/06/18  Yes [provider]  metoprolol succinate (TOPROL-XL) 100 MG 24 hr tablet Take 100 mg by mouth 2 (two) times daily. Take with or immediately following a meal.   Yes [provider]  montelukast (SINGULAIR) 10 MG tablet Take 10 mg by mouth at bedtime.    Yes [provider]  niacin (NIASPAN) 500 MG CR tablet Take 500 mg by mouth at bedtime.   Yes [provider]  Omega-3 Fatty Acids (FISH OIL) 1200 MG CAPS Take 1,200 mg by mouth 2 (two) times daily.   Yes [provider]  Oxycodone HCl 10 MG TABS Take 1 tablet (10 mg total) by mouth every  6 (six) hours as needed. Must last 30 days 01/21/20 02/20/20 Yes Milinda Pointer, MD  prasugrel (EFFIENT) 10 MG TABS tablet Take 10 mg by mouth daily. 11/22/19  Yes [provider]  rosuvastatin (CRESTOR) 40 MG tablet Take 40 mg by mouth at bedtime.   Yes [provider]  Glucagon (BAQSIMI ONE PACK NA) Place 1 Dose into the nose daily as needed (severely low blood sugar).    [provider]  HYDROcodone-acetaminophen (NORCO/VICODIN) 5-325 MG tablet Take 1 tablet by mouth 3 (three) times daily as needed. 01/28/20   [provider]  Magnesium 400 MG TABS Take 400 mg by mouth daily.     [provider]  naloxone [facility]) nasal spray 4 mg/0.1 mL Place 1 spray into the nose daily as needed (opioid overdose).    [provider]  naloxone [facility]) nasal spray 4 mg/0.1 mL For overdose/increased sleepiness. Call 911 if you need to use this medication 08/09/19   Zdeb, Altha Harm, NP  nitroGLYCERIN  (NITROSTAT) 0.4 MG SL tablet Place 0.4 mg under the tongue every 5 (five) minutes as needed for chest pain.    [provider]     Family History  Problem Relation Age of Onset  . Heart disease Mother   . Diabetes Mother   . Heart disease Father   . Cancer Sister   . Heart disease Maternal Aunt   . Heart disease Maternal Uncle   . Diabetes Maternal Grandmother   . Diabetes Paternal Grandmother     Social History   Socioeconomic History  . Marital status: Married    Spouse name: Not on file  . Number of children: Not on file  . Years of education: Not on file  . Highest education level: Not on file  Occupational History  . Not on file  Tobacco Use  . Smoking status: Never Smoker  . Smokeless tobacco: Never Used  Vaping Use  . Vaping Use: Never used  Substance and Sexual Activity  . Alcohol use: No  . Drug use: No  . Sexual activity: Not Currently  Other Topics Concern  . Not on file  Social History Narrative   Patient hospitalized x 3 events for high K+   Social Determinants of Health   Financial Resource Strain: Not on file  Food Insecurity: Not on file  Transportation Needs: Not on file  Physical Activity: Not on file  Stress: Not on file  Social Connections: Not on file   Review of Systems: A 12 point ROS discussed and pertinent positives are indicated in the HPI above.  All other systems are negative.  Vital Signs: BP (!) 145/82 (BP Location: Right Arm)   Pulse 99   Temp 98.5 F (36.9 C) (Oral)   Resp 16   Ht _0  (1.753 m)   Wt 102.1 kg   SpO2 98%   BMI 33.23 kg/m   Physical Exam Constitutional:      Appearance: He is ill-appearing.  HENT:     Head: Normocephalic.  Cardiovascular:     Rate and Rhythm: Normal rate and regular rhythm.  Pulmonary:     Effort: Tachypnea present.     Breath sounds: Normal breath sounds.  Abdominal:     Palpations: Abdomen is soft.     Tenderness: There is abdominal tenderness.  Skin:    General:  Skin is warm and dry.  Neurological:     Mental Status: He is alert.     Imaging: CT AP 02/07/20  [name] RUQ 02/07/20   HIDA 02/08/20   Labs:  CBC: Recent Labs    08/05/19 1138 02/07/20 1701 02/08/20 0424 02/09/20 0604  WBC 6.6 10.9* 8.9 7.6  HGB 14.4 15.9 13.2 12.6*  HCT 41.4 47.3 39.3 38.0*  PLT 214 212 141* 147*    COAGS: Recent Labs    08/05/19 1138 02/08/20 0424  INR 0.9 1.4*  APTT 27  --     BMP: Recent Labs    02/07/20 1701 02/08/20 0424 02/09/20 0604  NA 133* 136 137  K 5.9* 4.5 4.5  CL 98 103 104  CO2 20* 24 21*  GLUCOSE 240* 252* 328*  BUN 35* 26* 21  CALCIUM 10.0 9.4 9.5  CREATININE 1.84* 1.40* 1.22  GFRNONAA 40* 56* >60    LIVER FUNCTION TESTS: Recent Labs    02/07/20 1701 02/08/20 0424 02/09/20 0604  BILITOT 5.0* 4.5* 4.0*  AST 901* 359* 213*  ALT 1,275* 731* 488*  ALKPHOS 150* 128* 198*  PROT 7.6 6.1* 6.5  ALBUMIN 4.2 3.1* 3.1*    TUMOR MARKERS: No results for input(s): AFPTM, CEA, CA199, CHROMGRNA in the last 8760 hours.  Assessment and Plan:  64 year old male with probable acute acalculous cholecystitis, likely acute on chronic, with associated transaminitis and hyperbilirubinemia without definite biliary obstruction.  Given poor operative candidacy at this time, plan for percutaneous cholecystostomy tube placement with moderate sedation.  Plan for procedure today with fluoroscopic versus CT guidance.  Risks and benefits discussed with the patient including, but not limited to bleeding, infection, gallbladder perforation, bile leak, sepsis or even death.  All of the patient's questions were answered, patient is agreeable to proceed. Consent signed and in chart.    Thank you for this interesting consult.  I greatly enjoyed meeting ALGIS LEHENBAUER and look forward to participating in their care.  A copy of this report was sent to the requesting provider on this date.  Electronically Signed: Suzette Battiest,  MD 02/09/2020, 12:52 PM   I spent a total of 40 Minutes in face to face in clinical consultation, greater than 50% of which was counseling/coordinating care for acute cholecystitis.

## 2020-02-09 NOTE — ED Notes (Signed)
Ready Bed.  Contacted RN via secure chat and will transfer pt to bed once SBAR completed.

## 2020-02-09 NOTE — Progress Notes (Signed)
PROGRESS NOTE    OSHEA PERCIVAL  LZJ:[id number] DOB: 02-16-1956 DOA: 02/07/2020 PCP: [name], MD   Brief Narrative:  This 64 y.o.malewith PMH significant forcoronary artery disease status post stent angioplasty, COPD, chronic low back pain, GERD, hypertension, diabetes mellitus with complications of stage III kidney disease and Mnire's disease who presents to the emergency room for evaluation of chest pain.  Chest pain non-cardiac, and pt was found to have acute acalculous cholecystitis. General surgery consulted recommended IR guided percutaneous drainage., Gastroenterology was consulted, recommended to continue gram-negative coverage for cholecystitis,  no indication for GI intervention at this point.  Patient underwent percutaneous drain by IR.  Patient became hypoxic,  tachypneic post IR procedure.  PCCM consulted,  Patient was intubated and transferred to ICU.   Assessment & Plan:   Principal Problem:   Acute acalculous cholecystitis Active Problems:   Benign essential HTN   History of Meniere's disease   Diabetes mellitus, type II (HCC)   CAD (coronary artery disease)   CKD (chronic kidney disease) stage 3, GFR 30-59 ml/min (HCC)   Hyperkalemia   Severe sepsis (HCC)   Severe sepsis(POA) Patient was found to be tachycardic, tachypneic, febrile with lactic acid of 3.1 and GI source of sepsis acalculous cholecystitis. Continue IV antibiotics Zosyn for now. Follow blood cultures urine cultures. Continue IV hydration.  Acute acalculous cholecystitis: Patient found to have markedly elevated liver enzymes and imaging suggestive of a calculus cholecystitis. Continue IV Zosyn GenSurg recommended percutaneous drain by IR. GI recommended no need for GI intervention at this point. Continue empiric antibiotics for cholecystitis. Patient underwent percutaneous drain by IR, Patient became hypoxic, post IR procedure.  Tachycardic tachypneic. Patient was intubated and  transferred to ICU.  Transaminitis and hyperbilirubinemia  Most likely secondary to acute cholecystitis Hold statins, niacin and Zetia Trend liver enzymes.  AKI on CKD 3a --Cr 1.84 on presentation, improved to baseline with IV fluids. --d/c MIVF and encourage oral hydration  Hyperkalemia, resolved Most likely ACE inhibitor induced --s/p dextrose and insulin --cont to hold enalapril  History of coronary artery disease: Continue nitrates and beta-blockers Hold statins due to transaminitis  Diabetes mellitus poorly controlled. --A1c 8.2. --Hold home oral agents. --resume home Humulin R as 40u TID with meals --SSI TID  Obesity(BMI 33) Complicates overall prognosis and care  Hypertension Continue nitrates and metoprolol. Hold diltiazem and enalapril  Chronic back pain on chronic opioids --resume home oxycodone 45m q6h PRN --cont home gabapentin  Hx of Mnire's disease --Home med included Valium for Meniere's disease, however, PDMP showed no current Rx for any benzos. --d/c Vailum PRN    DVT prophylaxis: Lovenox Code Status: Full code Family Communication: Wife at bedside Disposition Plan:   Status is: Inpatient  Remains inpatient appropriate because:Inpatient level of care appropriate due to severity of illness   Dispo: The patient is from: Home              Anticipated d/c is to: SNF              Anticipated d/c date is: > 3 days              Patient currently is not medically stable to d/c.   Consultants:   General surgery  Gastroenterology  PCCM  Procedures: Intubation  Antimicrobials:  Anti-infectives (From admission, onward)   Start     Dose/Rate Route Frequency Ordered Stop   02/08/20 1800  cefTRIAXone (ROCEPHIN) 2 g in sodium chloride 0.9 % 100 mL IVPB  Status:  Discontinued        2 g 200 mL/hr over 30 Minutes Intravenous Every 24 hours 02/07/20 2230 02/07/20 2300   02/07/20 2330  piperacillin-tazobactam (ZOSYN) IVPB 3.375 g         3.375 g 12.5 mL/hr over 240 Minutes Intravenous Every 8 hours 02/07/20 2230     02/07/20 2230  metroNIDAZOLE (FLAGYL) IVPB 500 mg  Status:  Discontinued        500 mg 100 mL/hr over 60 Minutes Intravenous  Once 02/07/20 2223 02/07/20 2259   02/07/20 1930  cefTRIAXone (ROCEPHIN) 2 g in sodium chloride 0.9 % 100 mL IVPB        2 g 200 mL/hr over 30 Minutes Intravenous  Once 02/07/20 1918 02/07/20 2101      Subjective: Patient was seen and examined in the bedside.  He complains of her right upper quadrant abdominal pain,  overnight events noted.   Patient is scheduled to have percutaneous drain by IR.  Objective: Vitals:   02/09/20 0750 02/09/20 1213 02/09/20 1635 02/09/20 1719  BP: (!) 154/75 (!) 145/82 (!) 172/75   Pulse:  99 (!) 122   Resp:  16 (!) 42   Temp:  98.5 F (36.9 C) (!) 103.1 F (39.5 C)   TempSrc:  Oral Oral   SpO2:  98% 100% 100%  Weight:      Height:        Intake/Output Summary (Last 24 hours) at 02/09/2020 1750 Last data filed at 02/09/2020 1000 Gross per 24 hour  Intake 404.09 ml  Output --  Net 404.09 ml   Filed Weights   02/07/20 1645  Weight: 102.1 kg    Examination:  General exam: Appears calm and comfortable , appears in a lot of pain. Respiratory system: Clear to auscultation. Respiratory effort normal. Cardiovascular system: S1 & S2 heard, RRR. No JVD, murmurs, rubs, gallops or clicks. No pedal edema. Gastrointestinal system: Abdomen is nondistended, soft and nontender. No organomegaly or masses felt. Normal bowel sounds heard. Central nervous system: Alert and oriented. No focal neurological deficits. Extremities: No edema, no cyanosis, no clubbing. Skin: No rashes, lesions or ulcers Psychiatry: Judgement and insight appear normal. Mood & affect appropriate.     Data Reviewed: I have personally reviewed following labs and imaging studies  CBC: Recent Labs  Lab 02/07/20 1701 02/08/20 0424 02/09/20 0604  WBC 10.9* 8.9 7.6   HGB 15.9 13.2 12.6*  HCT 47.3 39.3 38.0*  MCV 82.4 82.6 82.6  PLT 212 141* 782*   Basic Metabolic Panel: Recent Labs  Lab 02/07/20 1701 02/08/20 0424 02/09/20 0604  NA 133* 136 137  K 5.9* 4.5 4.5  CL 98 103 104  CO2 20* 24 21*  GLUCOSE 240* 252* 328*  BUN 35* 26* 21  CREATININE 1.84* 1.40* 1.22  CALCIUM 10.0 9.4 9.5  MG  --   --  1.6*   GFR: Estimated Creatinine Clearance: 71.1 mL/min (by C-G formula based on SCr of 1.22 mg/dL). Liver Function Tests: Recent Labs  Lab 02/07/20 1701 02/08/20 0424 02/09/20 0604  AST 901* 359* 213*  ALT 1,275* 731* 488*  ALKPHOS 150* 128* 198*  BILITOT 5.0* 4.5* 4.0*  PROT 7.6 6.1* 6.5  ALBUMIN 4.2 3.1* 3.1*   Recent Labs  Lab 02/07/20 1701  LIPASE 22   Recent Labs  Lab 02/07/20 2300  AMMONIA 24   Coagulation Profile: Recent Labs  Lab 02/08/20 0424  INR 1.4*   Cardiac Enzymes: No results for input(s):  CKTOTAL, CKMB, CKMBINDEX, TROPONINI in the last 168 hours. BNP (last 3 results) No results for input(s): PROBNP in the last 8760 hours. HbA1C: Recent Labs    02/07/20 2300  HGBA1C 8.2*   CBG: Recent Labs  Lab 02/09/20 0744 02/09/20 1222 02/09/20 1358 02/09/20 1540 02/09/20 1706  GLUCAP 314* 259* 267* 225* 231*   Lipid Profile: No results for input(s): CHOL, HDL, LDLCALC, TRIG, CHOLHDL, LDLDIRECT in the last 72 hours. Thyroid Function Tests: No results for input(s): TSH, T4TOTAL, FREET4, T3FREE, THYROIDAB in the last 72 hours. Anemia Panel: No results for input(s): VITAMINB12, FOLATE, FERRITIN, TIBC, IRON, RETICCTPCT in the last 72 hours. Sepsis Labs: Recent Labs  Lab 02/07/20 1746 02/07/20 2300 02/08/20 0424  PROCALCITON  --   --  1.97  LATICACIDVEN 3.1* 1.8  --     Recent Results (from the past 240 hour(s))  Resp Panel by RT-PCR (Flu A&B, Covid) Nasopharyngeal Swab     Status: None   Collection Time: 02/07/20  5:46 PM   Specimen: Nasopharyngeal Swab; Nasopharyngeal(NP) swabs in vial transport  medium  Result Value Ref Range Status   SARS Coronavirus 2 by RT PCR NEGATIVE NEGATIVE Final    Comment: (NOTE) SARS-CoV-2 target nucleic acids are NOT DETECTED.  The SARS-CoV-2 RNA is generally detectable in upper respiratory specimens during the acute phase of infection. The lowest concentration of SARS-CoV-2 viral copies this assay can detect is 138 copies/mL. A negative result does not preclude SARS-Cov-2 infection and should not be used as the sole basis for treatment or other patient management decisions. A negative result may occur with  improper specimen collection/handling, submission of specimen other than nasopharyngeal swab, presence of viral mutation(s) within the areas targeted by this assay, and inadequate number of viral copies(<138 copies/mL). A negative result must be combined with clinical observations, patient history, and epidemiological information. The expected result is Negative.  Fact Sheet for Patients:  EntrepreneurPulse.com.au  Fact Sheet for Healthcare Providers:  IncredibleEmployment.be  This test is no t yet approved or cleared by the [place] FDA and  has been authorized for detection and/or diagnosis of SARS-CoV-2 by FDA under an Emergency Use Authorization (EUA). This EUA will remain  in effect (meaning this test can be used) for the duration of the COVID-19 declaration under Section 564(b)(1) of the Act, 21 U.S.C.section 360bbb-3(b)(1), unless the authorization is terminated  or revoked sooner.       Influenza A by PCR NEGATIVE NEGATIVE Final   Influenza B by PCR NEGATIVE NEGATIVE Final    Comment: (NOTE) The Xpert Xpress SARS-CoV-2/FLU/RSV plus assay is intended as an aid in the diagnosis of influenza from Nasopharyngeal swab specimens and should not be used as a sole basis for treatment. Nasal washings and aspirates are unacceptable for Xpert Xpress SARS-CoV-2/FLU/RSV testing.  Fact Sheet for  Patients: EntrepreneurPulse.com.au  Fact Sheet for Healthcare Providers: IncredibleEmployment.be  This test is not yet approved or cleared by the [place] FDA and has been authorized for detection and/or diagnosis of SARS-CoV-2 by FDA under an Emergency Use Authorization (EUA). This EUA will remain in effect (meaning this test can be used) for the duration of the COVID-19 declaration under Section 564(b)(1) of the Act, 21 U.S.C. section 360bbb-3(b)(1), unless the authorization is terminated or revoked.  Performed at [facility], [address] [place], [place] [address]   Culture, blood (Routine X 2) w Reflex to ID Panel     Status: None (Preliminary result)   Collection Time: 02/07/20  5:46 PM   Specimen: BLOOD  Result Value Ref Range Status   Specimen Description BLOOD BLOOD RIGHT FOREARM  Final   Special Requests   Final    BOTTLES DRAWN AEROBIC AND ANAEROBIC Blood Culture adequate volume   Culture   Final    NO GROWTH 2 DAYS Performed at [facility], [address], [place], [place] [address]    Report Status PENDING  Incomplete  Culture, blood (Routine X 2) w Reflex to ID Panel     Status: None (Preliminary result)   Collection Time: 02/07/20  5:46 PM   Specimen: BLOOD  Result Value Ref Range Status   Specimen Description BLOOD LEFT ANTECUBITAL  Final   Special Requests   Final    BOTTLES DRAWN AEROBIC AND ANAEROBIC Blood Culture adequate volume   Culture   Final    NO GROWTH 2 DAYS Performed at [facility], [address] [place], [place] [address]    Report Status PENDING  Incomplete  Urine Culture     Status: Abnormal   Collection Time: 02/07/20  5:46 PM   Specimen: Urine, Random  Result Value Ref Range Status   Specimen Description   Final    URINE, RANDOM Performed at [facility], [address], [place], [place] [address]    Special Requests   Final    NONE Performed at  [facility], [address] [name], [place] [address]    Culture (A)  Final    <10,000 COLONIES/mL INSIGNIFICANT GROWTH Performed at [facility], [address] [place], [place] [address]    Report Status 02/08/2020 FINAL  Final    Radiology Studies: CT ABDOMEN PELVIS WO CONTRAST  Result Date: 02/09/2020 CLINICAL DATA:  Abdominal distension. Cholecystostomy tube placement today. EXAM: CT ABDOMEN AND PELVIS WITHOUT CONTRAST TECHNIQUE: Multidetector CT imaging of the abdomen and pelvis was performed following the standard protocol without IV contrast. COMPARISON:  CT abdomen pelvis dated February 07, 2020. FINDINGS: Lower chest: No acute abnormality. Subsegmental atelectasis in both lower lobes. Hepatobiliary: No focal liver abnormality. Interval placement of a transhepatic cholecystostomy tube within the gallbladder lumen. The gallbladder remains distended and contains new high density material. No biliary dilatation. Pancreas: Unremarkable. No pancreatic ductal dilatation or surrounding inflammatory changes. Spleen: Normal in size without focal abnormality. Adrenals/Urinary Tract: The adrenal glands are unremarkable. 5.8 x 5.7 cm solid mass arising from the upper pole of the left kidney is grossly unchanged but better evaluated on prior contrast enhanced study. Unchanged 1.9 cm simple cyst in the lateral midpole of the left kidney. No renal calculi or hydronephrosis. The bladder is unremarkable. Stomach/Bowel: Stomach is within normal limits. Prior appendectomy. No evidence of bowel wall thickening, distention, or inflammatory changes. Vascular/Lymphatic: Aortic atherosclerosis. No enlarged abdominal or pelvic lymph nodes. Reproductive: Prostate is unremarkable. Other: No free fluid or pneumoperitoneum. Musculoskeletal: No acute or significant osseous findings. IMPRESSION: 1. Interval placement of a transhepatic cholecystostomy tube within the gallbladder lumen. The gallbladder  remains distended with some high density material, likely sludge and/or a small amount of post-procedure hematoma. No evident complication or new acute intra-abdominal process. 2. Unchanged 5.8 cm solid mass arising from the upper pole of the left kidney, better evaluated on prior contrast enhanced study, consistent with renal cell carcinoma. 3. Aortic Atherosclerosis (ICD10-I70.0). Electronically Signed   By: Titus Dubin M.D.   On: 02/09/2020 16:26   CT Angio Chest PE W and/or Wo Contrast  Result Date: 02/07/2020 CLINICAL  DATA:  64 year old male with abdominal pain and fever. EXAM: CT ANGIOGRAPHY CHEST CT ABDOMEN AND PELVIS WITH CONTRAST TECHNIQUE: Multidetector CT imaging of the chest was performed using the standard protocol during bolus administration of intravenous contrast. Multiplanar CT image reconstructions and MIPs were obtained to evaluate the vascular anatomy. Multidetector CT imaging of the abdomen and pelvis was performed using the standard protocol during bolus administration of intravenous contrast. CONTRAST:  2m OMNIPAQUE IOHEXOL 350 MG/ML SOLN COMPARISON:  Chest radiograph dated 02/07/2020 and CT of the abdomen pelvis dated 05/07/2007. FINDINGS: CTA CHEST FINDINGS Cardiovascular: Top-normal cardiac size. No pericardial effusion. Three-vessel coronary vascular calcification. The thoracic aorta is unremarkable. The origins of the great vessels of the aortic arch appear patent. Evaluation of the pulmonary arteries is somewhat limited due to respiratory motion artifact and suboptimal visualization of the peripheral branches. No central pulmonary artery embolus identified. Mediastinum/Nodes: No hilar or mediastinal adenopathy. The esophagus and the thyroid gland are grossly unremarkable. No mediastinal fluid collection. Lungs/Pleura: There are bibasilar subpleural atelectasis/scarring. No focal consolidation, pleural effusion, pneumothorax. The central airways are patent. Musculoskeletal:  T8-T9 disc spacer. No acute osseous pathology. Review of the MIP images confirms the above findings. CT ABDOMEN and PELVIS FINDINGS No intra-abdominal free air or free fluid. Hepatobiliary: Fatty liver. No intrahepatic biliary dilatation. There is mild haziness of the gallbladder wall and pericholecystic fat. No calcified gallstone. Ultrasound may provide better evaluation of the gallbladder if there is clinical concern for acute cholecystitis. Pancreas: Unremarkable. No pancreatic ductal dilatation or surrounding inflammatory changes. Spleen: Normal in size without focal abnormality. Adrenals/Urinary Tract: The adrenal glands unremarkable. There is a 5.6 x 5.9 cm heterogeneously enhancing mass in the upper pole of the left kidney most concerning for malignancy. Further characterization with renal mass protocol MRI is recommended. Additional 2 cm hypodense lesion in the upper pole of the left kidney laterally is not characterized but possibly a cyst. There is no hydronephrosis on either side. There is symmetric enhancement and excretion of contrast by both kidneys. The visualized ureters and urinary bladder appear unremarkable. Stomach/Bowel: There is no bowel obstruction or active inflammation. Appendectomy. Vascular/Lymphatic: Mild aortoiliac atherosclerotic disease. The IVC is unremarkable. No portal venous gas. There is no adenopathy. Reproductive: The prostate and seminal vesicles are grossly unremarkable. No pelvic mass. Other: None Musculoskeletal: No acute or significant osseous findings. Review of the MIP images confirms the above findings. IMPRESSION: 1. No acute intrathoracic pathology. No CT evidence of central pulmonary artery embolus. 2. Mild haziness of the gallbladder wall and pericholecystic fat. No calcified gallstone. Ultrasound may provide better evaluation of the gallbladder if there is clinical concern for acute cholecystitis. 3. A 5.6 x 5.9 cm heterogeneously enhancing mass in the upper pole  of the left kidney most concerning for malignancy. Further characterization with renal mass protocol MRI is recommended. 4. Fatty liver. 5. No bowel obstruction. 6. Aortic Atherosclerosis (ICD10-I70.0). Electronically Signed   By: AAnner CreteM.D.   On: 02/07/2020 20:37   NM Hepatobiliary Liver Func  Result Date: 02/08/2020 CLINICAL DATA:  Chronic upper abdominal pain assess gallbladder motility EXAM: NUCLEAR MEDICINE HEPATOBILIARY IMAGING TECHNIQUE: Sequential images of the abdomen were obtained out to 60 minutes following intravenous administration of radiopharmaceutical. RADIOPHARMACEUTICALS:  5.2 mCi Tc-942mCholetec IV COMPARISON:  02/07/2020 ultrasound abdomen and CT abdomen/pelvis exams FINDINGS: Delayed bloodstream clearance of tracer by liver indicating hepatocellular dysfunction. Prolonged hepatocellular retention of tracer. At 1 hour, no excretion of tracer into biliary tree. At 2 hours, no  excretion of tracer into biliary tree. Static image at 4 hours shows minimal tracer in small bowel loops in the LEFT mid abdomen indicating at least partial patency of the CBD. Gallbladder did not visualized by 4 hours. However, because of the severe degree of hepatocellular dysfunction and poor excretion of tracer, unable to assess patency of the cystic duct to exclude cholecystitis. Patient refused imaging beyond 4 hours. IMPRESSION: Severe hepatocellular dysfunction with marked retention of tracer within hepatic parenchyma at 4 hours. Only minimal tracer is seen within small bowel loops in the LEFT mid abdomen at 4 hours, indicating at least partial patency of CBD. Unable to assess patency of cystic duct, acute cholecystitis, or gallbladder emptying due to the severe degree of hepatocellular dysfunction. Electronically Signed   By: Lavonia Dana M.D.   On: 02/08/2020 16:00   CT ABDOMEN PELVIS W CONTRAST  Result Date: 02/07/2020 CLINICAL DATA:  64 year old male with abdominal pain and fever. EXAM: CT  ANGIOGRAPHY CHEST CT ABDOMEN AND PELVIS WITH CONTRAST TECHNIQUE: Multidetector CT imaging of the chest was performed using the standard protocol during bolus administration of intravenous contrast. Multiplanar CT image reconstructions and MIPs were obtained to evaluate the vascular anatomy. Multidetector CT imaging of the abdomen and pelvis was performed using the standard protocol during bolus administration of intravenous contrast. CONTRAST:  59m OMNIPAQUE IOHEXOL 350 MG/ML SOLN COMPARISON:  Chest radiograph dated 02/07/2020 and CT of the abdomen pelvis dated 05/07/2007. FINDINGS: CTA CHEST FINDINGS Cardiovascular: Top-normal cardiac size. No pericardial effusion. Three-vessel coronary vascular calcification. The thoracic aorta is unremarkable. The origins of the great vessels of the aortic arch appear patent. Evaluation of the pulmonary arteries is somewhat limited due to respiratory motion artifact and suboptimal visualization of the peripheral branches. No central pulmonary artery embolus identified. Mediastinum/Nodes: No hilar or mediastinal adenopathy. The esophagus and the thyroid gland are grossly unremarkable. No mediastinal fluid collection. Lungs/Pleura: There are bibasilar subpleural atelectasis/scarring. No focal consolidation, pleural effusion, pneumothorax. The central airways are patent. Musculoskeletal: T8-T9 disc spacer. No acute osseous pathology. Review of the MIP images confirms the above findings. CT ABDOMEN and PELVIS FINDINGS No intra-abdominal free air or free fluid. Hepatobiliary: Fatty liver. No intrahepatic biliary dilatation. There is mild haziness of the gallbladder wall and pericholecystic fat. No calcified gallstone. Ultrasound may provide better evaluation of the gallbladder if there is clinical concern for acute cholecystitis. Pancreas: Unremarkable. No pancreatic ductal dilatation or surrounding inflammatory changes. Spleen: Normal in size without focal abnormality.  Adrenals/Urinary Tract: The adrenal glands unremarkable. There is a 5.6 x 5.9 cm heterogeneously enhancing mass in the upper pole of the left kidney most concerning for malignancy. Further characterization with renal mass protocol MRI is recommended. Additional 2 cm hypodense lesion in the upper pole of the left kidney laterally is not characterized but possibly a cyst. There is no hydronephrosis on either side. There is symmetric enhancement and excretion of contrast by both kidneys. The visualized ureters and urinary bladder appear unremarkable. Stomach/Bowel: There is no bowel obstruction or active inflammation. Appendectomy. Vascular/Lymphatic: Mild aortoiliac atherosclerotic disease. The IVC is unremarkable. No portal venous gas. There is no adenopathy. Reproductive: The prostate and seminal vesicles are grossly unremarkable. No pelvic mass. Other: None Musculoskeletal: No acute or significant osseous findings. Review of the MIP images confirms the above findings. IMPRESSION: 1. No acute intrathoracic pathology. No CT evidence of central pulmonary artery embolus. 2. Mild haziness of the gallbladder wall and pericholecystic fat. No calcified gallstone. Ultrasound may provide better evaluation of  the gallbladder if there is clinical concern for acute cholecystitis. 3. A 5.6 x 5.9 cm heterogeneously enhancing mass in the upper pole of the left kidney most concerning for malignancy. Further characterization with renal mass protocol MRI is recommended. 4. Fatty liver. 5. No bowel obstruction. 6. Aortic Atherosclerosis (ICD10-I70.0). Electronically Signed   By: Anner Crete M.D.   On: 02/07/2020 20:37   IR Perc Cholecystostomy  Result Date: 02/09/2020 INDICATION: 64 year old male with acute cholecystitis, poor operative candidate. EXAM: Fluoroscopic and ultrasound-guided placement of cholecystostomy tube MEDICATIONS: The patient is receiving intravenous Zosyn as an inpatient. ANESTHESIA/SEDATION: Moderate  (conscious) sedation was employed during this procedure. A total of Versed 0.5 mg and Fentanyl 25 mcg was administered intravenously. Moderate Sedation Time: 10 minutes. The patient's level of consciousness and vital signs were monitored continuously by radiology nursing throughout the procedure under my direct supervision. FLUOROSCOPY TIME:  Fluoroscopy Time: 0.7 minutes (14 mGy). COMPLICATIONS: None immediate. PROCEDURE: Informed written consent was obtained from the patient after a thorough discussion of the procedural risks, benefits and alternatives. All questions were addressed. Maximal Sterile Barrier Technique was utilized including caps, mask, sterile gowns, sterile gloves, sterile drape, hand hygiene and skin antiseptic. A timeout was performed prior to the initiation of the procedure. The patient was placed supine on the angiographic table. The patient's right upper quadrant was then prepped and draped in normal sterile fashion with maximum sterile barrier. Ultrasound demonstrates a distended gallbladder. Subdermal Local anesthesia was provided at the planned skin entry site. Under ultrasound guidance, deeper local anesthetic was provided through intercostal muscles and along the liver capsule. Ultrasound was used to puncture the gallbladder using a 21 gauge Chiba needle via a transhepatic approach. A 0.018 inch wire was advanced into the lumen and a transition dilator placed. A gentle hand injection of contrast was performed. Cholecystogram demonstrates a distended gallbladder without definite evidence of gallstones. The cystic duct is obstructed. A 0.035 inch exchange wire was placed in the tract was dilated. A 10.2 French multipurpose drainage catheter was advanced into the gallbladder lumen. The drain was then secured in place using a 0-silk suture and a Stayfix device. A sterile dressing was applied. The tube was placed to bag drainage. A culture was sent to the lab for analysis. The patient  tolerated procedure well without evidence of immediate complication was transferred back to the floor in stable condition. IMPRESSION: Successful placement of a 10.2 French percutaneous, transhepatic cholecystostomy tube. PLAN: Return in 6 to 8 weeks for routine cholecystostomy tube check and change pending interim surgical intervention. Ruthann Cancer, MD [facility] Electronically Signed   By: Ruthann Cancer MD   On: 02/09/2020 15:34   US ABDOMEN LIMITED RUQ (LIVER/GB)  Result Date: 02/07/2020 CLINICAL DATA:  Right upper quadrant pain x2 days. EXAM: ULTRASOUND ABDOMEN LIMITED RIGHT UPPER QUADRANT COMPARISON:  None. FINDINGS: Gallbladder: Echogenic sludge is seen within the gallbladder lumen, without evidence of gallstones. Gallbladder wall thickening is noted (3.3 mm). A trace amount of pericholecystic fluid is also seen. No sonographic Murphy sign noted by sonographer. Common bile duct: Diameter: 3.6 mm Liver: No focal lesion identified. There is diffusely increased echogenicity of the liver parenchyma. Portal vein is patent on color Doppler imaging with normal direction of blood flow towards the liver. Other: None. IMPRESSION: Gallbladder sludge with additional findings that may represent sequelae associated with acalculous cholecystitis. Further evaluation with a nuclear medicine hepatobiliary scan is recommended. Electronically Signed   By: Hoover Browns  [place] M.D.   On: 02/07/2020 21:46   Scheduled Meds: . enoxaparin (LOVENOX) injection  0.5 mg/kg Subcutaneous Q24H  . fentaNYL      . fentaNYL      . fentaNYL (SUBLIMAZE) injection  150 mcg Intravenous Once  . gabapentin  1,600 mg Oral BID  . insulin aspart  0-15 Units Subcutaneous TID WC  . insulin regular human CONCENTRATED  40 Units Subcutaneous TID WC  . midazolam      . midazolam  2 mg Intravenous Once  . rocuronium      . rocuronium  40 mg Intravenous Once   Continuous Infusions: .  sodium chloride 250 mL (02/09/20 0846)  . fentaNYL    . fentaNYL infusion INTRAVENOUS    . magnesium sulfate bolus IVPB    . piperacillin-tazobactam (ZOSYN)  IV 3.375 g (02/09/20 0847)  . propofol    . propofol (DIPRIVAN) infusion       LOS: 2 days    Time spent: 35 mins    Karina Nofsinger, MD [facility]   If 7PM-7AM, please contact night-coverage

## 2020-02-09 NOTE — ED Notes (Signed)
Linens and gown changed due to pt urinating in the bed

## 2020-02-10 DIAGNOSIS — Z9911 Dependence on respirator [ventilator] status: Secondary | ICD-10-CM

## 2020-02-10 LAB — COMPREHENSIVE METABOLIC PANEL
ALT: 348 U/L — ABNORMAL HIGH (ref 0–44)
AST: 125 U/L — ABNORMAL HIGH (ref 15–41)
Albumin: 2.6 g/dL — ABNORMAL LOW (ref 3.5–5.0)
Alkaline Phosphatase: 231 U/L — ABNORMAL HIGH (ref 38–126)
Anion gap: 9 (ref 5–15)
BUN: 20 mg/dL (ref 8–23)
CO2: 26 mmol/L (ref 22–32)
Calcium: 9.2 mg/dL (ref 8.9–10.3)
Chloride: 103 mmol/L (ref 98–111)
Creatinine, Ser: 1.21 mg/dL (ref 0.61–1.24)
GFR, Estimated: >60 mL/min (ref >60)
Glucose, Bld: 263 mg/dL — ABNORMAL HIGH (ref 70–99)
Potassium: 4.3 mmol/L (ref 3.5–5.1)
Sodium: 138 mmol/L (ref 135–145)
Total Bilirubin: 2.3 mg/dL — ABNORMAL HIGH (ref 0.3–1.2)
Total Protein: 5.9 g/dL — ABNORMAL LOW (ref 6.5–8.1)

## 2020-02-10 LAB — CBC
HCT: 36.1 % — ABNORMAL LOW (ref 39.0–52.0)
Hemoglobin: 12.1 g/dL — ABNORMAL LOW (ref 13.0–17.0)
MCH: 27.8 pg (ref 26.0–34.0)
MCHC: 33.5 g/dL (ref 30.0–36.0)
MCV: 82.8 fL (ref 80.0–100.0)
Platelets: 168 10*3/uL (ref 150–400)
RBC: 4.36 MIL/uL (ref 4.22–5.81)
RDW: 14.7 % (ref 11.5–15.5)
WBC: 7.1 10*3/uL (ref 4.0–10.5)
nRBC: 0 % (ref 0.0–0.2)

## 2020-02-10 LAB — CMV IGM: CMV IgM: <30 AU/mL (ref 0.0–29.9)

## 2020-02-10 LAB — GLUCOSE, CAPILLARY
Glucose-Capillary: 253 mg/dL — ABNORMAL HIGH (ref 70–99)
Glucose-Capillary: 247 mg/dL — ABNORMAL HIGH (ref 70–99)
Glucose-Capillary: 231 mg/dL — ABNORMAL HIGH (ref 70–99)

## 2020-02-10 LAB — ANTI-SMOOTH MUSCLE ANTIBODY, IGG: F-Actin IgG: 3 Units (ref 0–19)

## 2020-02-10 LAB — ANTI-MICROSOMAL ANTIBODY LIVER / KIDNEY: LKM1 Ab: 0.6 Units (ref 0.0–20.0)

## 2020-02-10 LAB — MRSA PCR SCREENING: MRSA by PCR: NEGATIVE

## 2020-02-10 LAB — MITOCHONDRIAL ANTIBODIES: Mitochondrial M2 Ab, IgG: <20 Units (ref 0.0–20.0)

## 2020-02-10 LAB — MAGNESIUM: Magnesium: 1.9 mg/dL (ref 1.7–2.4)

## 2020-02-10 LAB — HEPATITIS C VRS RNA DETECT BY PCR-QUAL: Hepatitis C Vrs RNA by PCR-Qual: NEGATIVE

## 2020-02-10 LAB — HEPATITIS B SURFACE ANTIBODY, QUANTITATIVE: Hep B S AB Quant (Post): 9.5 m[IU]/mL — ABNORMAL LOW (ref >9.9)

## 2020-02-10 MED ORDER — ONDANSETRON HCL 4 MG/2ML IJ SOLN: 4.0000 mg | Freq: Four times a day (QID) | INTRAMUSCULAR | Status: DC | PRN

## 2020-02-10 MED ORDER — VITAL HIGH PROTEIN PO LIQD
1000.0000 mL | ORAL | Status: DC
  Administered 2020-02-10 – 2020-02-12 (×3): 1000 mL

## 2020-02-10 MED ORDER — INSULIN GLARGINE 100 UNIT/ML ~~LOC~~ SOLN
10.0000 [IU] | Freq: Two times a day (BID) | SUBCUTANEOUS | Status: DC
  Administered 2020-02-10 – 2020-02-14 (×8): 10 [IU] via SUBCUTANEOUS
  Filled 2020-02-10 (×9): qty 0.1

## 2020-02-10 MED ORDER — GABAPENTIN 250 MG/5ML PO SOLN
1600.0000 mg | Freq: Two times a day (BID) | ORAL | Status: DC
  Filled 2020-02-10: qty 32

## 2020-02-10 MED ORDER — PROSOURCE TF PO LIQD
90.0000 mL | Freq: Three times a day (TID) | ORAL | Status: DC
  Administered 2020-02-10 – 2020-02-13 (×9): 90 mL
  Filled 2020-02-10: qty 90

## 2020-02-10 MED ORDER — PANTOPRAZOLE SODIUM 40 MG PO PACK
40.0000 mg | PACK | Freq: Two times a day (BID) | ORAL | Status: DC
  Administered 2020-02-10 – 2020-02-13 (×7): 40 mg
  Filled 2020-02-10 (×7): qty 20

## 2020-02-10 MED ORDER — ACETAMINOPHEN 160 MG/5ML PO SOLN
1000.0000 mg | Freq: Three times a day (TID) | ORAL | Status: DC | PRN
  Administered 2020-02-12 – 2020-02-13 (×3): 1000 mg
  Filled 2020-02-10 (×4): qty 40.6

## 2020-02-10 MED ORDER — ONDANSETRON HCL 4 MG PO TABS: 4.0000 mg | ORAL_TABLET | Freq: Four times a day (QID) | ORAL | Status: DC | PRN

## 2020-02-10 NOTE — Progress Notes (Signed)
 CRITICAL CARE NOTE    Name: Kenneth Osborn MRN: [id number] DOB: 09/02/1955     LOS: 3     SUBJECTIVE FINDINGS & SIGNIFICANT EVENTS    Patient description:   64 yo male with PMH of coronary artery disease status post stent angioplasty, COPD, chronic low back pain, GERD, hypertension, diabetes mellitus with complications of stage III kidney disease and Mnire's disease who presents to the emergency room for evaluation of chest pain.  He reported in ED that pain is mostly across his chest with radiation to the right arm associated with fever, chills and fatigue.  He complained of nausea but denies having any abdominal pain.  Patient felt that he may have infection with coronavirus but denies having any known exposure.  He denied having any diaphoresis, no palpitations, no shortness of breath but he has felt dizzy and lightheaded.  He denied having any diarrhea, no constipation, no melena stools, no hematochezia, no urinary frequency, no nocturia, no cough, no headache.  He has chronic low back pain but has had multiple back surgeries and stated that there has not been a change in the intensity of his pain.  In the ER he was noted to have a fever with a T-max of 101.4 F, he was tachycardic and tachypneic as well.  Labs show sodium 133, calcium 9.9, chloride 99, bicarbonate 20, glucose 240, BUN 35, creatinine 1.84, calcium 10, anion gap 15, alkaline phosphatase 115, albumin 4.2, lipase 22, AST 901, ALT 1275, total protein 7.6, bilirubin 3.6 troponin 8, lactic acid 3.1, white count 10.9, hemoglobin 15.9 hematocrit 47.3, MCV 82, RDW 13.8, platelet count 212, PT 11.9, INR 0.9  Respiratory viral panel is negative  Gallbladder ultrasound shows gallbladder sludge with findings that may represent sequelae of acalculus cholecystitis CT  scan of abdomen and pelvis showed mild haziness of the gallbladder wall and pericholecystic fat. No calcified gallstones. Ultrasound may provide better evaluation of the gallbladder if there is a concern for acute cholecystitis. 5.6 x 5.9 heterogeneously enhancing mass in the upper pole of the left kidney most concerning for malignancy.  CT angiogram of the chest is negative for pulmonary embolism  Chest x-ray reviewed by me shows clear lungs  Twelve-lead EKG reviewed by me shows sinus tachycardia with a left posterior fascicular block  Patient is a 64-year-old patient male who presents to the emergency room for evaluation of chest pain was noted to be febrile, tachycardic and tachypneic with an elevated lactic acid level. White count is slightly elevated at 10.9 and source of sepsis appears to be secondary to acute acalculous cholecystitis.  He will be admitted to the hospital for further evaluation.    Patient was in PACU for cholecystostomy tube placement but continued to deteriorate and required increased O2 as well as decreasing mentation.  During my evaluation patients wife was present and was able to give additional details.  Patient is unresponsive with severe acute hypoxemic respiratory failure and in circulatory shock.   PCCM consultation for likely septic shock with multiorgan failure in acutely comatose state.     02/10/20- patient on MV RASS-1. Awakening trial following verbal communication. RUQ drain with less blood more serosang.  Consulted urology for L renal mass - spoke with Dr Stoioff.  Patient seen by surgery today - spoke with Dr Rodenberg.     Lines/tubes : Biliary Tube RUQ (Active)    Microbiology/Sepsis markers: Results for orders placed or performed during the hospital encounter of 02/07/20  Resp Panel by   RT-PCR (Flu A&B, Covid) Nasopharyngeal Swab     Status: None   Collection Time: 02/07/20  5:46 PM   Specimen: Nasopharyngeal Swab; Nasopharyngeal(NP) swabs in vial  transport medium  Result Value Ref Range Status   SARS Coronavirus 2 by RT PCR NEGATIVE NEGATIVE Final    Comment: (NOTE) SARS-CoV-2 target nucleic acids are NOT DETECTED.  The SARS-CoV-2 RNA is generally detectable in upper respiratory specimens during the acute phase of infection. The lowest concentration of SARS-CoV-2 viral copies this assay can detect is 138 copies/mL. A negative result does not preclude SARS-Cov-2 infection and should not be used as the sole basis for treatment or other patient management decisions. A negative result may occur with  improper specimen collection/handling, submission of specimen other than nasopharyngeal swab, presence of viral mutation(s) within the areas targeted by this assay, and inadequate number of viral copies(<138 copies/mL). A negative result must be combined with clinical observations, patient history, and epidemiological information. The expected result is Negative.  Fact Sheet for Patients:  https://www.fda.gov/media/152166/download  Fact Sheet for Healthcare Providers:  https://www.fda.gov/media/152162/download  This test is no t yet approved or cleared by the [place] FDA and  has been authorized for detection and/or diagnosis of SARS-CoV-2 by FDA under an Emergency Use Authorization (EUA). This EUA will remain  in effect (meaning this test can be used) for the duration of the COVID-19 declaration under Section 564(b)(1) of the Act, 21 U.S.C.section 360bbb-3(b)(1), unless the authorization is terminated  or revoked sooner.       Influenza A by PCR NEGATIVE NEGATIVE Final   Influenza B by PCR NEGATIVE NEGATIVE Final    Comment: (NOTE) The Xpert Xpress SARS-CoV-2/FLU/RSV plus assay is intended as an aid in the diagnosis of influenza from Nasopharyngeal swab specimens and should not be used as a sole basis for treatment. Nasal washings and aspirates are unacceptable for Xpert Xpress SARS-CoV-2/FLU/RSV testing.  Fact  Sheet for Patients: https://www.fda.gov/media/152166/download  Fact Sheet for Healthcare Providers: https://www.fda.gov/media/152162/download  This test is not yet approved or cleared by the [place] FDA and has been authorized for detection and/or diagnosis of SARS-CoV-2 by FDA under an Emergency Use Authorization (EUA). This EUA will remain in effect (meaning this test can be used) for the duration of the COVID-19 declaration under Section 564(b)(1) of the Act, 21 U.S.C. section 360bbb-3(b)(1), unless the authorization is terminated or revoked.  Performed at [facility], [address] [place], [place] [address]   Culture, blood (Routine X 2) w Reflex to ID Panel     Status: None (Preliminary result)   Collection Time: 02/07/20  5:46 PM   Specimen: BLOOD  Result Value Ref Range Status   Specimen Description BLOOD BLOOD RIGHT FOREARM  Final   Special Requests   Final    BOTTLES DRAWN AEROBIC AND ANAEROBIC Blood Culture adequate volume   Culture   Final    NO GROWTH 3 DAYS Performed at [facility], [address] [place], [place] [address]    Report Status PENDING  Incomplete  Culture, blood (Routine X 2) w Reflex to ID Panel     Status: None (Preliminary result)   Collection Time: 02/07/20  5:46 PM   Specimen: BLOOD  Result Value Ref Range Status   Specimen Description BLOOD LEFT ANTECUBITAL  Final   Special Requests   Final    BOTTLES DRAWN AEROBIC AND ANAEROBIC Blood Culture adequate volume   Culture   Final    NO GROWTH 3 DAYS Performed   at [facility], [address] [place], [place] [address]    Report Status PENDING  Incomplete  Urine Culture     Status: Abnormal   Collection Time: 02/07/20  5:46 PM   Specimen: Urine, Random  Result Value Ref Range Status   Specimen Description   Final    URINE, RANDOM Performed at [facility], [address] [place], [place] [address]    Special Requests   Final     NONE Performed at [facility], [address] [place], [place] [address]    Culture (A)  Final    <10,000 COLONIES/mL INSIGNIFICANT GROWTH Performed at [name], [address] [place], [place] [address]    Report Status 02/08/2020 FINAL  Final  Aerobic/Anaerobic Culture (surgical/deep wound)     Status: None (Preliminary result)   Collection Time: 02/09/20  3:00 PM   Specimen: Gallbladder; Abscess  Result Value Ref Range Status   Specimen Description   Final    GALL BLADDER Performed at [facility], [address] [place], [place] [address]    Special Requests   Final    NONE Performed at [facility], [address] [place], [place] [address]    Gram Stain   Final    ABUNDANT WBC PRESENT, PREDOMINANTLY PMN MODERATE GRAM NEGATIVE RODS Performed at  Hospital Lab, [address] [place], [place] [address]    Culture PENDING  Incomplete   Report Status PENDING  Incomplete  MRSA PCR Screening     Status: None   Collection Time: 02/10/20 12:00 AM   Specimen: Nasal Mucosa; Nasopharyngeal  Result Value Ref Range Status   MRSA by PCR NEGATIVE NEGATIVE Final    Comment:        The GeneXpert MRSA Assay (FDA approved for NASAL specimens only), is one component of a comprehensive MRSA colonization surveillance program. It is not intended to diagnose MRSA infection nor to guide or monitor treatment for MRSA infections. Performed at  Hospital Lab, [address] [place], [place] [address]     Anti-infectives:  Anti-infectives (From admission, onward)   Start     Dose/Rate Route Frequency Ordered Stop   02/08/20 1800  cefTRIAXone (ROCEPHIN) 2 g in sodium chloride 0.9 % 100 mL IVPB  Status:  Discontinued        2 g 200 mL/hr over 30 Minutes Intravenous Every 24 hours 02/07/20 2230 02/07/20 2300   02/07/20 2330  piperacillin-tazobactam (ZOSYN) IVPB 3.375 g        3.375 g 12.5 mL/hr over 240 Minutes Intravenous Every 8  hours 02/07/20 2230     02/07/20 2230  metroNIDAZOLE (FLAGYL) IVPB 500 mg  Status:  Discontinued        500 mg 100 mL/hr over 60 Minutes Intravenous  Once 02/07/20 2223 02/07/20 2259   02/07/20 1930  cefTRIAXone (ROCEPHIN) 2 g in sodium chloride 0.9 % 100 mL IVPB        2 g 200 mL/hr over 30 Minutes Intravenous  Once 02/07/20 1918 02/07/20 2101       Consults: Treatment Team:  Rodenberg, Denny, MD     PAST MEDICAL HISTORY   Past Medical History:  Diagnosis Date  . Acute postoperative pain 12/03/2018  . Allergic rhinitis 12/30/2012  . Anginal pain (HCC)   . Bronchitis    hx of  . Can't get food down 08/12/2014  . Chronic back pain    thoracic area  . Concussion   09/2015  . COPD (chronic obstructive pulmonary disease) ([place])   . Coronary artery disease    99% blockage  . DDD (degenerative disc disease), cervical   . Dehydration symptoms    2019  . 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 Dysphagia   . GERD (gastroesophageal reflux disease)   . History of Meniere's disease 12/21/2014  . History of thoracic spine surgery (S/P T9-10 IVD spacer) 12/21/2014  . Hypercholesteremia   . Hyperlipidemia   . Hypertension    sees Dr. Jenny Reichmann walker Jefm Bryant  . 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 Myocardial infarction [facility])    Sees Dr. Drema [place], [place] [facility]  . Neuromuscular disorder ([place])    diabetic neuropathy in feet  . Psychosis ([place])   . Short-segment Barrett's esophagus   . Sleep apnea    uses cpap and 2 L o2 at hs     SURGICAL HISTORY   Past Surgical History:  Procedure Laterality Date  . APPENDECTOMY    . ARTHRODESIS ANTERIOR ANTERIOR CERVICLE SPINE  01/04/2013  . BACK SURGERY     fusion thoracic area  . CARDIAC CATHETERIZATION     may 2012 and Nov 20, 2010  . CARDIAC CATHETERIZATION N/A 06/15/2015   Procedure: Left Heart Cath and Coronary Angiography;  Surgeon: Corey Skains, MD;  Location: [facility];  Service: Cardiovascular;  Laterality: N/A;  . CARDIAC CATHETERIZATION N/A 06/15/2015   Procedure: Coronary Stent Intervention;  Surgeon: Isaias Cowman, MD;  Location: [facility];  Service: Cardiovascular;  Laterality: N/A;  . CARDIOVASCULAR STRESS TEST     jan 2014  . COLONOSCOPY WITH PROPOFOL N/A 09/19/2014   Procedure: COLONOSCOPY WITH PROPOFOL;  Surgeon: Manya Silvas, MD;  Location: [facility] ENDOSCOPY;  Service: Endoscopy;  Laterality: N/A;  . CORONARY ANGIOPLASTY     stent placement  . ESOPHAGOGASTRODUODENOSCOPY N/A 09/19/2014   Procedure: ESOPHAGOGASTRODUODENOSCOPY (EGD);  Surgeon: Manya Silvas, MD;  Location: [facility] ENDOSCOPY;  Service: Endoscopy;  Laterality: N/A;  . IR PERC CHOLECYSTOSTOMY  02/09/2020  . [place] right ear  . LUMBAR SPINAL CORD SIMULATOR LEAD REMOVAL Right 08/09/2019   Procedure: REMOVAL SPINAL CORD STIMULATOR PERCUTANEOUS LEADS, REMOVAL PULSE GENERATOR;  Surgeon: [name], MD;  Location: [facility] ORS;  Service: Neurosurgery;  Laterality: Right;  LOCAL WITH MAC  . mastoid shunt     left, 2002, 1997 right ear, 1980 right ear  . PULSE GENERATOR IMPLANT N/A 01/18/2019   Procedure: MEDTRONIC SPINAL CORD STIMULATOR BATTERY EXCHANGE;  Surgeon: [name], MD;  Location: [facility] ORS;  Service: Neurosurgery;  Laterality: N/A;  . SAVORY DILATION N/A 09/19/2014   Procedure: SAVORY DILATION;  Surgeon: Manya Silvas, MD;  Location: [facility] ENDOSCOPY;  Service: Endoscopy;  Laterality: N/A;  . SHOULDER ARTHROSCOPY WITH SUBACROMIAL DECOMPRESSION Left 04/06/2012   Procedure: SHOULDER ARTHROSCOPY WITH SUBACROMIAL DECOMPRESSION;  Surgeon: Vickey Huger, MD;  Location: [facility];  Service: Orthopedics;  Laterality: Left;  left shoulder arthroscopy, subacromial decompression and distal clavicle resection  . SPINAL CORD STIMULATOR IMPLANT Right      FAMILY HISTORY   Family History  Problem Relation Age of Onset  . Heart disease Mother   . Diabetes Mother    . Heart disease Father   . Cancer Sister   . Heart disease Maternal Aunt   . Heart disease Maternal Uncle   . Diabetes Maternal Grandmother   . Diabetes Paternal Grandmother  SOCIAL HISTORY   Social History   Tobacco Use  . Smoking status: Never Smoker  . Smokeless tobacco: Never Used  Vaping Use  . Vaping Use: Never used  Substance Use Topics  . Alcohol use: No  . Drug use: No     MEDICATIONS   Current Medication:  Current Facility-Administered Medications:  .  0.9 %  sodium chloride infusion, , Intravenous, PRN, Kumar, Pardeep, MD, Stopped at 02/10/20 0904 .  acetaminophen (TYLENOL) 160 MG/5ML solution 1,000 mg, 1,000 mg, Per Tube, TID PRN, Kumar, Pardeep, MD .  chlorhexidine gluconate (MEDLINE KIT) (PERIDEX) 0.12 % solution 15 mL, 15 mL, Mouth Rinse, BID, Blakeney, Dana G, NP, 15 mL at 02/10/20 0746 .  Chlorhexidine Gluconate Cloth 2 % PADS 6 each, 6 each, Topical, Daily, Blakeney, Dana G, NP .  enoxaparin (LOVENOX) injection 50 mg, 0.5 mg/kg, Subcutaneous, Q24H, Lai, Tina, MD, 50 mg at 02/09/20 2145 .  fentaNYL 2500mcg in NS 250mL (10mcg/ml) infusion-PREMIX, 0-400 mcg/hr, Intravenous, Continuous, , , MD, Last Rate: 15 mL/hr at 02/10/20 1100, 150 mcg/hr at 02/10/20 1100 .  gabapentin (NEURONTIN) 250 MG/5ML solution 1,600 mg, 1,600 mg, Per Tube, BID, Kumar, Pardeep, MD .  insulin aspart (novoLOG) injection 0-15 Units, 0-15 Units, Subcutaneous, Q4H, Blakeney, Dana G, NP, 5 Units at 02/10/20 0744 .  insulin glargine (LANTUS) injection 10 Units, 10 Units, Subcutaneous, QHS, Blakeney, Dana G, NP, 10 Units at 02/09/20 2353 .  MEDLINE mouth rinse, 15 mL, Mouth Rinse, 10 times per day, Blakeney, Dana G, NP, 15 mL at 02/10/20 1009 .  nitroGLYCERIN (NITROSTAT) SL tablet 0.4 mg, 0.4 mg, Sublingual, Q5 min PRN, Agbata, Tochukwu, MD .  ondansetron (ZOFRAN) tablet 4 mg, 4 mg, Per Tube, Q6H PRN **OR** ondansetron (ZOFRAN) injection 4 mg, 4 mg, Intravenous, Q6H PRN,  Kumar, Pardeep, MD .  piperacillin-tazobactam (ZOSYN) IVPB 3.375 g, 3.375 g, Intravenous, Q8H, Agbata, Tochukwu, MD, Stopped at 02/10/20 0724 .  propofol (DIPRIVAN) 1000 MG/100ML infusion, 5-80 mcg/kg/min, Intravenous, Titrated, , , MD, Last Rate: 37.4 mL/hr at 02/10/20 1100, 60 mcg/kg/min at 02/10/20 1100    ALLERGIES   Patient has no known allergies.    REVIEW OF SYSTEMS     Unable to obtain patient is unresponsive  PHYSICAL EXAMINATION   Vital Signs: Temp:  [97.6 F (36.4 C)-103.1 F (39.5 C)] 97.6 F (36.4 C) (12/23 0800) Pulse Rate:  [80-129] 95 (12/23 1100) Resp:  [16-42] 20 (12/23 1100) BP: (92-184)/(59-100) 129/79 (12/23 1100) SpO2:  [94 %-100 %] 98 % (12/23 1100) FiO2 (%):  [40 %-60 %] 40 % (12/23 0801) Weight:  [104.3 kg] 104.3 kg (12/22 1354)  GENERAL:age appropriate HEAD: Normocephalic, atraumatic.  EYES: Pupils equal, round, reactive to light.  No scleral icterus.  MOUTH: Moist mucosal membrane. NECK: Supple. No thyromegaly. No nodules. No JVD.  PULMONARY: decreased air entry bilateraly  CARDIOVASCULAR: S1 and S2. Regular rate and rhythm. No murmurs, rubs, or gallops.  GASTROINTESTINAL: Soft, nontender, non-distended. No masses. Positive bowel sounds. No hepatosplenomegaly.  MUSCULOSKELETAL: No swelling, clubbing, or edema.  NEUROLOGIC: encephalopathic GCS7 SKIN:intact,warm,dry   PERTINENT DATA     Infusions: . sodium chloride Stopped (02/10/20 0904)  . fentaNYL infusion INTRAVENOUS 150 mcg/hr (02/10/20 1100)  . piperacillin-tazobactam (ZOSYN)  IV Stopped (02/10/20 0724)  . propofol (DIPRIVAN) infusion 60 mcg/kg/min (02/10/20 1100)   Scheduled Medications: . chlorhexidine gluconate (MEDLINE KIT)  15 mL Mouth Rinse BID  . Chlorhexidine Gluconate Cloth  6 each Topical Daily  . enoxaparin (LOVENOX) injection  0.5 mg/kg   Subcutaneous Q24H  . gabapentin  1,600 mg Per Tube BID  . insulin aspart  0-15 Units Subcutaneous Q4H  . insulin  glargine  10 Units Subcutaneous QHS  . mouth rinse  15 mL Mouth Rinse 10 times per day   PRN Medications: sodium chloride, acetaminophen (TYLENOL) oral liquid 160 mg/5 mL, nitroGLYCERIN, ondansetron **OR** ondansetron (ZOFRAN) IV Hemodynamic parameters:   Intake/Output: 12/22 0701 - 12/23 0700 In: 937.4 [P.O.:240; I.V.:527.4; IV Piggyback:169.9] Out: 790 [Urine:650; Drains:140]  Ventilator  Settings: Vent Mode: PRVC FiO2 (%):  [40 %-60 %] 40 % Set Rate:  [20 bmp] 20 bmp Vt Set:  [500 mL] 500 mL PEEP:  [5 cmH20] 5 cmH20 Plateau Pressure:  [20 cmH20] 20 cmH20    LAB RESULTS:  Basic Metabolic Panel: Recent Labs  Lab 02/07/20 1701 02/08/20 0424 02/09/20 0604 02/10/20 0548  NA 133* 136 137 138  K 5.9* 4.5 4.5 4.3  CL 98 103 104 103  CO2 20* 24 21* 26  GLUCOSE 240* 252* 328* 263*  BUN 35* 26* 21 20  CREATININE 1.84* 1.40* 1.22 1.21  CALCIUM 10.0 9.4 9.5 9.2  MG  --   --  1.6* 1.9   Liver Function Tests: Recent Labs  Lab 02/07/20 1701 02/08/20 0424 02/09/20 0604 02/10/20 0548  AST 901* 359* 213* 125*  ALT 1,275* 731* 488* 348*  ALKPHOS 150* 128* 198* 231*  BILITOT 5.0* 4.5* 4.0* 2.3*  PROT 7.6 6.1* 6.5 5.9*  ALBUMIN 4.2 3.1* 3.1* 2.6*   Recent Labs  Lab 02/07/20 1701  LIPASE 22   Recent Labs  Lab 02/07/20 2300  AMMONIA 24   CBC: Recent Labs  Lab 02/07/20 1701 02/08/20 0424 02/09/20 0604 02/10/20 0548  WBC 10.9* 8.9 7.6 7.1  HGB 15.9 13.2 12.6* 12.1*  HCT 47.3 39.3 38.0* 36.1*  MCV 82.4 82.6 82.6 82.8  PLT 212 141* 147* 168   Cardiac Enzymes: No results for input(s): CKTOTAL, CKMB, CKMBINDEX, TROPONINI in the last 168 hours. BNP: Invalid input(s): POCBNP CBG: Recent Labs  Lab 02/09/20 1706 02/09/20 1929 02/09/20 2316 02/10/20 0332 02/10/20 0730  GLUCAP 231* 250* 269* 253* 247*       IMAGING RESULTS:  Imaging: CT ABDOMEN PELVIS WO CONTRAST  Result Date: 02/09/2020 CLINICAL DATA:  Abdominal distension. Cholecystostomy tube  placement today. EXAM: CT ABDOMEN AND PELVIS WITHOUT CONTRAST TECHNIQUE: Multidetector CT imaging of the abdomen and pelvis was performed following the standard protocol without IV contrast. COMPARISON:  CT abdomen pelvis dated February 07, 2020. FINDINGS: Lower chest: No acute abnormality. Subsegmental atelectasis in both lower lobes. Hepatobiliary: No focal liver abnormality. Interval placement of a transhepatic cholecystostomy tube within the gallbladder lumen. The gallbladder remains distended and contains new high density material. No biliary dilatation. Pancreas: Unremarkable. No pancreatic ductal dilatation or surrounding inflammatory changes. Spleen: Normal in size without focal abnormality. Adrenals/Urinary Tract: The adrenal glands are unremarkable. 5.8 x 5.7 cm solid mass arising from the upper pole of the left kidney is grossly unchanged but better evaluated on prior contrast enhanced study. Unchanged 1.9 cm simple cyst in the lateral midpole of the left kidney. No renal calculi or hydronephrosis. The bladder is unremarkable. Stomach/Bowel: Stomach is within normal limits. Prior appendectomy. No evidence of bowel wall thickening, distention, or inflammatory changes. Vascular/Lymphatic: Aortic atherosclerosis. No enlarged abdominal or pelvic lymph nodes. Reproductive: Prostate is unremarkable. Other: No free fluid or pneumoperitoneum. Musculoskeletal: No acute or significant osseous findings. IMPRESSION: 1. Interval placement of a transhepatic cholecystostomy tube within the gallbladder lumen.  The gallbladder remains distended with some high density material, likely sludge and/or a small amount of post-procedure hematoma. No evident complication or new acute intra-abdominal process. 2. Unchanged 5.8 cm solid mass arising from the upper pole of the left kidney, better evaluated on prior contrast enhanced study, consistent with renal cell carcinoma. 3. Aortic Atherosclerosis (ICD10-I70.0). Electronically  Signed   By: William T Derry M.D.   On: 02/09/2020 16:26   DG Chest 1 View  Result Date: 02/09/2020 CLINICAL DATA:  Intubated, recent cholecystostomy tube placement EXAM: CHEST  1 VIEW COMPARISON:  02/07/2020 FINDINGS: Single frontal view of the chest demonstrates endotracheal tube overlying tracheal air column, tip just below thoracic inlet. The cardiac silhouette is unremarkable. Patchy retrocardiac consolidation likely reflects atelectasis. No effusion or pneumothorax. IMPRESSION: 1. No complication after intubation. 2. Minimal left basilar atelectasis. Electronically Signed   By: Michael  Brown M.D.   On: 02/09/2020 18:38   DG Abd 1 View  Result Date: 02/09/2020 CLINICAL DATA:  OG tube placement EXAM: ABDOMEN - 1 VIEW COMPARISON:  CT dated February 07, 2020 FINDINGS: The OG tube projects over the gastric body. There is a percutaneous drain in the right upper quadrant likely representing the patient's cholecystostomy tube. The bowel gas pattern is nonobstructive. IMPRESSION: OG tube projects over the gastric body. Electronically Signed   By: Christopher  Green M.D.   On: 02/09/2020 21:17   NM Hepatobiliary Liver Func  Result Date: 02/08/2020 CLINICAL DATA:  Chronic upper abdominal pain assess gallbladder motility EXAM: NUCLEAR MEDICINE HEPATOBILIARY IMAGING TECHNIQUE: Sequential images of the abdomen were obtained out to 60 minutes following intravenous administration of radiopharmaceutical. RADIOPHARMACEUTICALS:  5.2 mCi Tc-99m  Choletec IV COMPARISON:  02/07/2020 ultrasound abdomen and CT abdomen/pelvis exams FINDINGS: Delayed bloodstream clearance of tracer by liver indicating hepatocellular dysfunction. Prolonged hepatocellular retention of tracer. At 1 hour, no excretion of tracer into biliary tree. At 2 hours, no excretion of tracer into biliary tree. Static image at 4 hours shows minimal tracer in small bowel loops in the LEFT mid abdomen indicating at least partial patency of the CBD.  Gallbladder did not visualized by 4 hours. However, because of the severe degree of hepatocellular dysfunction and poor excretion of tracer, unable to assess patency of the cystic duct to exclude cholecystitis. Patient refused imaging beyond 4 hours. IMPRESSION: Severe hepatocellular dysfunction with marked retention of tracer within hepatic parenchyma at 4 hours. Only minimal tracer is seen within small bowel loops in the LEFT mid abdomen at 4 hours, indicating at least partial patency of CBD. Unable to assess patency of cystic duct, acute cholecystitis, or gallbladder emptying due to the severe degree of hepatocellular dysfunction. Electronically Signed   By: Mark  Boles M.D.   On: 02/08/2020 16:00   IR Perc Cholecystostomy  Result Date: 02/09/2020 INDICATION: 64-year-old male with acute cholecystitis, poor operative candidate. EXAM: Fluoroscopic and ultrasound-guided placement of cholecystostomy tube MEDICATIONS: The patient is receiving intravenous Zosyn as an inpatient. ANESTHESIA/SEDATION: Moderate (conscious) sedation was employed during this procedure. A total of Versed 0.5 mg and Fentanyl 25 mcg was administered intravenously. Moderate Sedation Time: 10 minutes. The patient's level of consciousness and vital signs were monitored continuously by radiology nursing throughout the procedure under my direct supervision. FLUOROSCOPY TIME:  Fluoroscopy Time: 0.7 minutes (14 mGy). COMPLICATIONS: None immediate. PROCEDURE: Informed written consent was obtained from the patient after a thorough discussion of the procedural risks, benefits and alternatives. All questions were addressed. Maximal Sterile Barrier Technique was utilized including caps, mask, sterile   gowns, sterile gloves, sterile drape, hand hygiene and skin antiseptic. A timeout was performed prior to the initiation of the procedure. The patient was placed supine on the angiographic table. The patient's right upper quadrant was then prepped and  draped in normal sterile fashion with maximum sterile barrier. Ultrasound demonstrates a distended gallbladder. Subdermal Local anesthesia was provided at the planned skin entry site. Under ultrasound guidance, deeper local anesthetic was provided through intercostal muscles and along the liver capsule. Ultrasound was used to puncture the gallbladder using a 21 gauge Chiba needle via a transhepatic approach. A 0.018 inch wire was advanced into the lumen and a transition dilator placed. A gentle hand injection of contrast was performed. Cholecystogram demonstrates a distended gallbladder without definite evidence of gallstones. The cystic duct is obstructed. A 0.035 inch exchange wire was placed in the tract was dilated. A 10.2 French multipurpose drainage catheter was advanced into the gallbladder lumen. The drain was then secured in place using a 0-silk suture and a Stayfix device. A sterile dressing was applied. The tube was placed to bag drainage. A culture was sent to the lab for analysis. The patient tolerated procedure well without evidence of immediate complication was transferred back to the floor in stable condition. IMPRESSION: Successful placement of a 10.2 French percutaneous, transhepatic cholecystostomy tube. PLAN: Return in 6 to 8 weeks for routine cholecystostomy tube check and change pending interim surgical intervention. Dylan Suttle, MD [facility] Electronically Signed   By: Dylan  Suttle MD   On: 02/09/2020 15:34   @[email or web address]@ CT ABDOMEN PELVIS WO CONTRAST  Result Date: 02/09/2020 CLINICAL DATA:  Abdominal distension. Cholecystostomy tube placement today. EXAM: CT ABDOMEN AND PELVIS WITHOUT CONTRAST TECHNIQUE: Multidetector CT imaging of the abdomen and pelvis was performed following the standard protocol without IV contrast. COMPARISON:  CT abdomen pelvis dated February 07, 2020. FINDINGS: Lower chest: No acute abnormality.  Subsegmental atelectasis in both lower lobes. Hepatobiliary: No focal liver abnormality. Interval placement of a transhepatic cholecystostomy tube within the gallbladder lumen. The gallbladder remains distended and contains new high density material. No biliary dilatation. Pancreas: Unremarkable. No pancreatic ductal dilatation or surrounding inflammatory changes. Spleen: Normal in size without focal abnormality. Adrenals/Urinary Tract: The adrenal glands are unremarkable. 5.8 x 5.7 cm solid mass arising from the upper pole of the left kidney is grossly unchanged but better evaluated on prior contrast enhanced study. Unchanged 1.9 cm simple cyst in the lateral midpole of the left kidney. No renal calculi or hydronephrosis. The bladder is unremarkable. Stomach/Bowel: Stomach is within normal limits. Prior appendectomy. No evidence of bowel wall thickening, distention, or inflammatory changes. Vascular/Lymphatic: Aortic atherosclerosis. No enlarged abdominal or pelvic lymph nodes. Reproductive: Prostate is unremarkable. Other: No free fluid or pneumoperitoneum. Musculoskeletal: No acute or significant osseous findings. IMPRESSION: 1. Interval placement of a transhepatic cholecystostomy tube within the gallbladder lumen. The gallbladder remains distended with some high density material, likely sludge and/or a small amount of post-procedure hematoma. No evident complication or new acute intra-abdominal process. 2. Unchanged 5.8 cm solid mass arising from the upper pole of the left kidney, better evaluated on prior contrast enhanced study, consistent with renal cell carcinoma. 3. Aortic Atherosclerosis (ICD10-I70.0). Electronically Signed   By: William T Derry M.D.   On: 02/09/2020 16:26   DG Chest 1 View  Result Date: 02/09/2020 CLINICAL DATA:  Intubated, recent cholecystostomy tube placement EXAM: CHEST  1 VIEW COMPARISON:  02/07/2020 FINDINGS: Single frontal view of the chest demonstrates endotracheal   tube  overlying tracheal air column, tip just below thoracic inlet. The cardiac silhouette is unremarkable. Patchy retrocardiac consolidation likely reflects atelectasis. No effusion or pneumothorax. IMPRESSION: 1. No complication after intubation. 2. Minimal left basilar atelectasis. Electronically Signed   By: Randa Ngo M.D.   On: 02/09/2020 18:38   DG Abd 1 View  Result Date: 02/09/2020 CLINICAL DATA:  OG tube placement EXAM: ABDOMEN - 1 VIEW COMPARISON:  CT dated February 07, 2020 FINDINGS: The OG tube projects over the gastric body. There is a percutaneous drain in the right upper quadrant likely representing the patient's cholecystostomy tube. The bowel gas pattern is nonobstructive. IMPRESSION: OG tube projects over the gastric body. Electronically Signed   By: Constance Holster M.D.   On: 02/09/2020 21:17   IR Perc Cholecystostomy  Result Date: 02/09/2020 INDICATION: 64 year old male with acute cholecystitis, poor operative candidate. EXAM: Fluoroscopic and ultrasound-guided placement of cholecystostomy tube MEDICATIONS: The patient is receiving intravenous Zosyn as an inpatient. ANESTHESIA/SEDATION: Moderate (conscious) sedation was employed during this procedure. A total of Versed 0.5 mg and Fentanyl 25 mcg was administered intravenously. Moderate Sedation Time: 10 minutes. The patient's level of consciousness and vital signs were monitored continuously by radiology nursing throughout the procedure under my direct supervision. FLUOROSCOPY TIME:  Fluoroscopy Time: 0.7 minutes (14 mGy). COMPLICATIONS: None immediate. PROCEDURE: Informed written consent was obtained from the patient after a thorough discussion of the procedural risks, benefits and alternatives. All questions were addressed. Maximal Sterile Barrier Technique was utilized including caps, mask, sterile gowns, sterile gloves, sterile drape, hand hygiene and skin antiseptic. A timeout was performed prior to the initiation of the  procedure. The patient was placed supine on the angiographic table. The patient's right upper quadrant was then prepped and draped in normal sterile fashion with maximum sterile barrier. Ultrasound demonstrates a distended gallbladder. Subdermal Local anesthesia was provided at the planned skin entry site. Under ultrasound guidance, deeper local anesthetic was provided through intercostal muscles and along the liver capsule. Ultrasound was used to puncture the gallbladder using a 21 gauge Chiba needle via a transhepatic approach. A 0.018 inch wire was advanced into the lumen and a transition dilator placed. A gentle hand injection of contrast was performed. Cholecystogram demonstrates a distended gallbladder without definite evidence of gallstones. The cystic duct is obstructed. A 0.035 inch exchange wire was placed in the tract was dilated. A 10.2 French multipurpose drainage catheter was advanced into the gallbladder lumen. The drain was then secured in place using a 0-silk suture and a Stayfix device. A sterile dressing was applied. The tube was placed to bag drainage. A culture was sent to the lab for analysis. The patient tolerated procedure well without evidence of immediate complication was transferred back to the floor in stable condition. IMPRESSION: Successful placement of a 10.2 French percutaneous, transhepatic cholecystostomy tube. PLAN: Return in 6 to 8 weeks for routine cholecystostomy tube check and change pending interim surgical intervention. Ruthann Cancer, MD [facility] Electronically Signed   By: Ruthann Cancer MD   On: 02/09/2020 15:34      ASSESSMENT AND PLAN    -Multidisciplinary rounds held today    Acutely comatose state    - likely due to septic encephalopathy     - discussed with wife at bedside      - patient will need to have airway secured and treat underlying cause     - CT head when more stable -correct  electrolytes -empiric  abx -lactate  -procalcitonin -neurology evaluation when sepsis treated -consider LP   Acute cholecystitis     -surgery on case - spoke with Zach - appreciate input  -s/p cholecystostomy tube - frank pus and some serosang active draining  - empiric Zosyn IV ICU monitoring   Acute Renal Failure-stage 2 most likely due to septic nephropathy  resolving -follow chem 7 -follow UO -continue Foley Catheter-assess need daily   Transaminitis In context of sepsis acute cholecysititis and shock physiology  - GI on case - appreciate input - workup in progress    - hepatitis panel negative  - intubated and sedated - minimal sedation to achieve a RASS goal: -1 Wake up assessment pending   Sepsis    -present on admission    - due to intraabdominal infection - acute cholecystitis -use vasopressors to keep MAP>65 -follow ABG and LA -follow up cultures -emperic ABX -consider stress dose steroids   ID -continue IV abx as prescibed -follow up cultures  GI/Nutrition GI PROPHYLAXIS as indicated DIET-->TF's as tolerated Constipation protocol as indicated  ENDO - ICU hypoglycemic\Hyperglycemia protocol -check FSBS per protocol   ELECTROLYTES -follow labs as needed -replace as needed -pharmacy consultation   DVT/GI PRX ordered -SCDs  TRANSFUSIONS AS NEEDED MONITOR FSBS ASSESS the need for LABS as needed   Critical care provider statement:    Critical care time (minutes):  33   Critical care time was exclusive of:  Separately billable procedures and treating other patients   Critical care was necessary to treat or prevent imminent or life-threatening deterioration of the following conditions:  acute cholecystitis, acutely comatose, encephalopathy, sepsis, transaminits, multiple comorbid conditions   Critical care was time spent personally by me on the following activities:  Development of treatment plan with patient or surrogate, discussions with  consultants, evaluation of patient's response to treatment, examination of patient, obtaining history from patient or surrogate, ordering and performing treatments and interventions, ordering and review of laboratory studies and re-evaluation of patient's condition.  I assumed direction of critical care for this patient from another provider in my specialty: no    This document was prepared using Dragon voice recognition software and may include unintentional dictation errors.     , M.D.  Division of Pulmonary & Critical Care Medicine  [facility]                                                                                                        

## 2020-02-10 NOTE — Progress Notes (Signed)
Transfer report received from [place], [place]   Emergent intubation completed, foley placed, initial assessment completed.  Wife at the bedside, all questions and concerns addressed. Wife will take all belongings home, leaving only his gold colored wedding ring on his left ring finger.

## 2020-02-10 NOTE — Progress Notes (Addendum)
[facility] SURGICAL PROGRESS NOTE (cpt [phone])  Hospital Day(s): 3.   Interval History:  Yesterday (12/22) afternoon underwent percutaneous cholecystostomy tube placement with IR which revealed purulent aspirate from the gallbladder. Following this, he appeared to develop septicemia with hypoxia, tachycardia, tachypnea, and was febrile. PCCM was consulted and he was transferred to ICU and subsequently intubated  Patient seen and examined this morning Hemodynamics are improving; he is not requiring vasopressor support No fevers since 0000 He remains without leukocytosis, WBC 7.1K Remaining labs this morning are pending Cx from cholecystostomy are growing gram negative rods Cholecystostomy tube output in last 24 hours recorded at 140 ccs; bloody, mixed with bile  Continues on Zosyn  Review of Systems:  Unable to reliably preform secondary to intubation   Vital signs in last 24 hours: [min-max] current  Temp:  [97.6 F (36.4 C)-103.1 F (39.5 C)] 97.6 F (36.4 C) (12/23 0800) Pulse Rate:  [85-129] 85 (12/23 0800) Resp:  [16-42] 20 (12/23 0800) BP: (94-184)/(59-100) 98/59 (12/23 0800) SpO2:  [94 %-100 %] 98 % (12/23 0801) FiO2 (%):  [40 %-60 %] 40 % (12/23 0801) Weight:  [230 lb (104.3 kg)] 230 lb (104.3 kg) (12/22 1354)     Height: 5' 9"  (175.3 cm) Weight: 225 lb (102.1 kg) BMI (Calculated): 33.21   Intake/Output last 2 shifts:  12/22 0701 - 12/23 0700 In: 937.4 [P.O.:240; I.V.:527.4; IV Piggyback:169.9] Out: 32 [Urine:650; Drains:140]   Physical Exam:  Constitutional: sedated, intubated  HENT: normocephalic without obvious abnormality  Respiratory: on ventilator Cardiovascular: regular rate and sinus rhythm  Gastrointestinal: Soft, protuberant at baseline, unable to assess tenderness, cholecystostomy tube in the RUQ, output bloody but mixed with more bile this morning Musculoskeletal: no edema or wounds, motor and sensation grossly intact, NT    Labs:   CBC Latest Ref Rng & Units 02/10/2020 02/09/2020 02/08/2020  WBC 4.0 - 10.5 K/uL 7.1 7.6 8.9  Hemoglobin 13.0 - 17.0 g/dL 12.1(L) 12.6(L) 13.2  Hematocrit 39.0 - 52.0 % 36.1(L) 38.0(L) 39.3  Platelets 150 - 400 K/uL 168 147(L) 141(L)   CMP Latest Ref Rng & Units 02/10/2020 02/09/2020 02/08/2020  Glucose 70 - 99 mg/dL 263(H) 328(H) 252(H)  BUN 8 - 23 mg/dL 20 21 26(H)  Creatinine 0.61 - 1.24 mg/dL 1.21 1.22 1.40(H)  Sodium 135 - 145 mmol/L 138 137 136  Potassium 3.5 - 5.1 mmol/L 4.3 4.5 4.5  Chloride 98 - 111 mmol/L 103 104 103  CO2 22 - 32 mmol/L 26 21(L) 24  Calcium 8.9 - 10.3 mg/dL 9.2 9.5 9.4  Total Protein 6.5 - 8.1 g/dL 5.9(L) 6.5 6.1(L)  Total Bilirubin 0.3 - 1.2 mg/dL 2.3(H) 4.0(H) 4.5(H)  Alkaline Phos 38 - 126 U/L 231(H) 198(H) 128(H)  AST 15 - 41 U/L 125(H) 213(H) 359(H)  ALT 0 - 44 U/L 348(H) 488(H) 731(H)     Imaging studies: No new pertinent imaging studies   Assessment/Plan: (ICD-10's: K81.0) 64 y.o. male with septicemia, possibly transient, now requiring ventilator support, secondary to cholecystitis s/p percutaneous cholecystostomy tube placement (26/20), complicated by pertinent comorbidities including significant cardiovascular disease, CAD s/p PCI   - Greatly appreciate PCCM assistance with this patient; continue ventilator support; wean per protocol    - Continue IV Abx (Zosyn); follow up Cx from 12/22  - Maintain cholecystostomy tube; monitor output   - IVF Resuscitation             - Monitor abdominal examination             -  Pain control prn; antiemetics prn    - Further management per primary service; we will follow    All of the above findings and recommendations were discussed with the medical team.  -- Loel Dubonnet, PA-C [facility] 02/10/2020, 9:09 AM [phone] M-F: 7am - 4pm  D/w wife at bedside, CT findings, and the unlikelihood of urgent surgery this hospitalization.  Seems to be responding to his treatment for  this episode of septic shock and making clinical progress today.  His abdomen remains benign and his drain functioning well, LFTs all improving.   Ronny Bacon, M.D., [facility]  02/10/2020 ; 3:05 PM

## 2020-02-10 NOTE — Progress Notes (Signed)
Initial Nutrition Assessment  DOCUMENTATION CODES:   Obesity unspecified  INTERVENTION:   Initiate vital HP @20ml /hr + Pro-source TF 33m TID via tube   Propofol: 37.4 ml/hr- provides 987kcal/day   Free water flushes 325mq4 hours to maintain tube patency   Regimen provides 1707kcal/day, 108g/day protein and 58174may free water  Provide Liquid MVI daily via tube   NUTRITION DIAGNOSIS:   Inadequate oral intake related to inability to eat (pt sedated and ventilated) as evidenced by NPO status.  GOAL:   Provide needs based on ASPEN/SCCM guidelines  MONITOR:   Vent status,Labs,Weight trends,I & O's,Skin  REASON FOR ASSESSMENT:   Ventilator    ASSESSMENT:   64 17o. male with medical history significant for coronary artery disease status post stent angioplasty, COPD, chronic low back pain, GERD, hypertension, diabetes mellitus with complications of stage III kidney disease and Mnire's disease who presented to the emergency room for evaluation of chest pain and was found to have acute acalculous cholecystitis s/p IR drain 12/82/50mplicated by septic encephalopathy requiring intubation and ventilation  Pt sedated and ventilated. OGT in place. Suspect pt with good appetite and oral intake at baseline. Pt documented to be eating 70% of meals on 12/21 prior to NPO. Per chart, pt appears weight stable at baseline. Will plan to start tube feeds today at trickle rate as pt on high rate of propofol.   Medications reviewed and include: lovenox, insulin, protonix, fentanyl, zosyn, propofol  Labs reviewed: K 4.3 wnl, Mg 1.9 wnl, alk phos 231(H), AST 125(H), ALT 348(H) cbgs- 253, 247, 231 x 24 hrs AIC 8.2(H)- 12/20  Patient is currently intubated on ventilator support MV: 9.8 L/min Temp (24hrs), Avg:99.9 F (37.7 C), Min:97.6 F (36.4 C), Max:103.1 F (39.5 C)  Propofol: 37.4 ml/hr- provides 987kcal/day   MAP- >35m21m NUTRITION - FOCUSED PHYSICAL EXAM:  Flowsheet Row Most  Recent Value  Orbital Region No depletion  Upper Arm Region No depletion  Thoracic and Lumbar Region No depletion  Buccal Region No depletion  Temple Region No depletion  Clavicle Bone Region No depletion  Clavicle and Acromion Bone Region No depletion  Scapular Bone Region No depletion  Dorsal Hand No depletion  Patellar Region No depletion  Anterior Thigh Region No depletion  Posterior Calf Region No depletion  Edema (RD Assessment) None  Hair Reviewed  Eyes Reviewed  Mouth Reviewed  Skin Reviewed  Nails Reviewed     Diet Order:   Diet Order            Diet NPO time specified  Diet effective now                EDUCATION NEEDS:   No education needs have been identified at this time  Skin:  Skin Assessment: Reviewed RN Assessment  Last BM:  12/21- type 4  Height:   Ht Readings from Last 1 Encounters:  02/07/20 5' 9"  (1.753 m)    Weight:   Wt Readings from Last 1 Encounters:  02/07/20 102.1 kg    Ideal Body Weight:  72.7 kg  BMI:  Body mass index is 33.23 kg/m.  Estimated Nutritional Needs:   Kcal:  1125-1431kcal/day  Protein:  >145g/day  Fluid:  2.1-2.4L/day  CaseKoleen Distance RD, LDN Please refer to [facility] and/or RD on-call/weekend/after hours pager

## 2020-02-10 NOTE — Progress Notes (Signed)
Neuro: sedation continued to support intubation, some titration required to reach RASS goals Resp: stable on ventilator settings CV: afebrile, vital signs stable GIGU: foley/OG in place-patent and well tolerated Skin: clean dry and intact, intermitently clammy  Social: wife, Pamala Hurry, at the bedside throughout the day, all questions and concerns addressed, she is updating family regularly

## 2020-02-10 NOTE — Progress Notes (Signed)
[name], MD [address] [place], [place], [place], [address] [address], [place], [place], [place], [address] Phone: [phone]  Fax: [phone]   Subjective: Patient was transferred to ICU yesterday and remains intubated and sedated.  Wife at bedside   Objective: Exam: Vital signs in last 24 hours: Vitals:   02/10/20 1100 02/10/20 1200 02/10/20 1300 02/10/20 1325  BP: 1_0   Pulse: 95 81 80   Resp: _1 Temp:      TempSrc:  Axillary    SpO2: 98% 97% 97% 98%  Weight:      Height:       Weight change:   Intake/Output Summary (Last 24 hours) at 02/10/2020 1426 Last data filed at 02/10/2020 1300 Gross per 24 hour  Intake 935.34 ml  Output 1130 ml  Net -194.66 ml    General: Intubated, sedated Abd: Soft, NT/ND, No HSM Skin: Warm, no rashes Neck: Supple, Trachea midline   Lab Results: Lab Results  Component Value Date   WBC 7.1 02/10/2020   HGB 12.1 (L) 02/10/2020   HCT 36.1 (L) 02/10/2020   MCV 82.8 02/10/2020   PLT 168 02/10/2020   Micro Results: Recent Results (from the past 240 hour(s))  Resp Panel by RT-PCR (Flu A&B, Covid) Nasopharyngeal Swab     Status: None   Collection Time: 02/07/20  5:46 PM   Specimen: Nasopharyngeal Swab; Nasopharyngeal(NP) swabs in vial transport medium  Result Value Ref Range Status   SARS Coronavirus 2 by RT PCR NEGATIVE NEGATIVE Final    Comment: (NOTE) SARS-CoV-2 target nucleic acids are NOT DETECTED.  The SARS-CoV-2 RNA is generally detectable in upper respiratory specimens during the acute phase of infection. The lowest concentration of SARS-CoV-2 viral copies this assay can detect is 138 copies/mL. A negative result does not preclude SARS-Cov-2 infection and should not be used as the sole basis for treatment or other patient management decisions. A negative result may occur with  improper specimen collection/handling, submission of specimen other than nasopharyngeal swab,  presence of viral mutation(s) within the areas targeted by this assay, and inadequate number of viral copies(<138 copies/mL). A negative result must be combined with clinical observations, patient history, and epidemiological information. The expected result is Negative.  Fact Sheet for Patients:  EntrepreneurPulse.com.au  Fact Sheet for Healthcare Providers:  IncredibleEmployment.be  This test is no t yet approved or cleared by the [place] FDA and  has been authorized for detection and/or diagnosis of SARS-CoV-2 by FDA under an Emergency Use Authorization (EUA). This EUA will remain  in effect (meaning this test can be used) for the duration of the COVID-19 declaration under Section 564(b)(1) of the Act, 21 U.S.C.section 360bbb-3(b)(1), unless the authorization is terminated  or revoked sooner.       Influenza A by PCR NEGATIVE NEGATIVE Final   Influenza B by PCR NEGATIVE NEGATIVE Final    Comment: (NOTE) The Xpert Xpress SARS-CoV-2/FLU/RSV plus assay is intended as an aid in the diagnosis of influenza from Nasopharyngeal swab specimens and should not be used as a sole basis for treatment. Nasal washings and aspirates are unacceptable for Xpert Xpress SARS-CoV-2/FLU/RSV testing.  Fact Sheet for Patients: EntrepreneurPulse.com.au  Fact Sheet for Healthcare Providers: IncredibleEmployment.be  This test is not yet approved or cleared by the [place] FDA and has been authorized for detection and/or diagnosis of SARS-CoV-2 by FDA under an Emergency Use Authorization (EUA). This EUA will remain in effect (meaning this test  can be used) for the duration of the COVID-19 declaration under Section 564(b)(1) of the Act, 21 U.S.C. section 360bbb-3(b)(1), unless the authorization is terminated or revoked.  Performed at [facility], [address] [place], [place] [address]   Culture, blood  (Routine X 2) w Reflex to ID Panel     Status: None (Preliminary result)   Collection Time: 02/07/20  5:46 PM   Specimen: BLOOD  Result Value Ref Range Status   Specimen Description BLOOD BLOOD RIGHT FOREARM  Final   Special Requests   Final    BOTTLES DRAWN AEROBIC AND ANAEROBIC Blood Culture adequate volume   Culture   Final    NO GROWTH 3 DAYS Performed at [facility], [address] [place], [place] [address]    Report Status PENDING  Incomplete  Culture, blood (Routine X 2) w Reflex to ID Panel     Status: None (Preliminary result)   Collection Time: 02/07/20  5:46 PM   Specimen: BLOOD  Result Value Ref Range Status   Specimen Description BLOOD LEFT ANTECUBITAL  Final   Special Requests   Final    BOTTLES DRAWN AEROBIC AND ANAEROBIC Blood Culture adequate volume   Culture   Final    NO GROWTH 3 DAYS Performed at [facility], [address] [place], [place] [address]    Report Status PENDING  Incomplete  Urine Culture     Status: Abnormal   Collection Time: 02/07/20  5:46 PM   Specimen: Urine, Random  Result Value Ref Range Status   Specimen Description   Final    URINE, RANDOM Performed at [facility], [address], [place], [place] [address]    Special Requests   Final    NONE Performed at [facility], [address], [place], [place] [address]    Culture (A)  Final    <10,000 COLONIES/mL INSIGNIFICANT GROWTH Performed at [facility], [place] [address], [place], [place] [address]    Report Status 02/08/2020 FINAL  Final  Aerobic/Anaerobic Culture (surgical/deep wound)     Status: None (Preliminary result)   Collection Time: 02/09/20  3:00 PM   Specimen: Gallbladder; Abscess  Result Value Ref Range Status   Specimen Description   Final    GALL BLADDER Performed at [facility], [address], [place], [place] [address]    Special Requests   Final    NONE Performed at [facility], [address] [place], [place] [address]    Gram Stain   Final    ABUNDANT WBC PRESENT, PREDOMINANTLY PMN MODERATE GRAM NEGATIVE RODS Performed at [facility], [place] [address], [place], [place] [address]    Culture FEW KLEBSIELLA PNEUMONIAE  Final   Report Status PENDING  Incomplete  MRSA PCR Screening     Status: None   Collection Time: 02/10/20 12:00 AM   Specimen: Nasal Mucosa; Nasopharyngeal  Result Value Ref Range Status   MRSA by PCR NEGATIVE NEGATIVE Final    Comment:        The GeneXpert MRSA Assay (FDA approved for NASAL specimens only), is one component of a comprehensive MRSA colonization surveillance program. It is not intended to diagnose MRSA infection nor to guide or monitor treatment for MRSA infections. Performed at [facility], [address] [place], [place] [address]    Studies/Results: CT ABDOMEN PELVIS WO CONTRAST  Result Date: 02/09/2020 CLINICAL DATA:  Abdominal distension. Cholecystostomy tube placement today.  EXAM: CT ABDOMEN AND PELVIS WITHOUT CONTRAST TECHNIQUE: Multidetector CT imaging of the abdomen and pelvis was performed following the standard protocol without IV contrast. COMPARISON:  CT abdomen pelvis dated February 07, 2020. FINDINGS: Lower chest: No acute abnormality. Subsegmental atelectasis in both lower lobes. Hepatobiliary: No focal liver abnormality. Interval placement of a transhepatic cholecystostomy tube within the gallbladder lumen. The gallbladder remains distended and contains new high density material. No biliary dilatation. Pancreas: Unremarkable. No pancreatic ductal dilatation or surrounding inflammatory changes. Spleen: Normal in size without focal abnormality. Adrenals/Urinary Tract: The adrenal glands are unremarkable. 5.8 x 5.7 cm solid mass arising from the upper pole of the left kidney is grossly unchanged but better evaluated on prior contrast enhanced study. Unchanged 1.9 cm simple cyst in the lateral midpole  of the left kidney. No renal calculi or hydronephrosis. The bladder is unremarkable. Stomach/Bowel: Stomach is within normal limits. Prior appendectomy. No evidence of bowel wall thickening, distention, or inflammatory changes. Vascular/Lymphatic: Aortic atherosclerosis. No enlarged abdominal or pelvic lymph nodes. Reproductive: Prostate is unremarkable. Other: No free fluid or pneumoperitoneum. Musculoskeletal: No acute or significant osseous findings. IMPRESSION: 1. Interval placement of a transhepatic cholecystostomy tube within the gallbladder lumen. The gallbladder remains distended with some high density material, likely sludge and/or a small amount of post-procedure hematoma. No evident complication or new acute intra-abdominal process. 2. Unchanged 5.8 cm solid mass arising from the upper pole of the left kidney, better evaluated on prior contrast enhanced study, consistent with renal cell carcinoma. 3. Aortic Atherosclerosis (ICD10-I70.0). Electronically Signed   By: Titus Dubin M.D.   On: 02/09/2020 16:26   DG Chest 1 View  Result Date: 02/09/2020 CLINICAL DATA:  Intubated, recent cholecystostomy tube placement EXAM: CHEST  1 VIEW COMPARISON:  02/07/2020 FINDINGS: Single frontal view of the chest demonstrates endotracheal tube overlying tracheal air column, tip just below thoracic inlet. The cardiac silhouette is unremarkable. Patchy retrocardiac consolidation likely reflects atelectasis. No effusion or pneumothorax. IMPRESSION: 1. No complication after intubation. 2. Minimal left basilar atelectasis. Electronically Signed   By: Randa Ngo M.D.   On: 02/09/2020 18:38   DG Abd 1 View  Result Date: 02/09/2020 CLINICAL DATA:  OG tube placement EXAM: ABDOMEN - 1 VIEW COMPARISON:  CT dated February 07, 2020 FINDINGS: The OG tube projects over the gastric body. There is a percutaneous drain in the right upper quadrant likely representing the patient's cholecystostomy tube. The bowel gas pattern  is nonobstructive. IMPRESSION: OG tube projects over the gastric body. Electronically Signed   By: Constance Holster M.D.   On: 02/09/2020 21:17   NM Hepatobiliary Liver Func  Result Date: 02/08/2020 CLINICAL DATA:  Chronic upper abdominal pain assess gallbladder motility EXAM: NUCLEAR MEDICINE HEPATOBILIARY IMAGING TECHNIQUE: Sequential images of the abdomen were obtained out to 60 minutes following intravenous administration of radiopharmaceutical. RADIOPHARMACEUTICALS:  5.2 mCi Tc-80m Choletec IV COMPARISON:  02/07/2020 ultrasound abdomen and CT abdomen/pelvis exams FINDINGS: Delayed bloodstream clearance of tracer by liver indicating hepatocellular dysfunction. Prolonged hepatocellular retention of tracer. At 1 hour, no excretion of tracer into biliary tree. At 2 hours, no excretion of tracer into biliary tree. Static image at 4 hours shows minimal tracer in small bowel loops in the LEFT mid abdomen indicating at least partial patency of the CBD. Gallbladder did not visualized by 4 hours. However, because of the severe degree of hepatocellular dysfunction and poor excretion of tracer, unable to assess patency of the cystic duct to exclude cholecystitis. Patient refused imaging beyond  4 hours. IMPRESSION: Severe hepatocellular dysfunction with marked retention of tracer within hepatic parenchyma at 4 hours. Only minimal tracer is seen within small bowel loops in the LEFT mid abdomen at 4 hours, indicating at least partial patency of CBD. Unable to assess patency of cystic duct, acute cholecystitis, or gallbladder emptying due to the severe degree of hepatocellular dysfunction. Electronically Signed   By: Lavonia Dana M.D.   On: 02/08/2020 16:00   IR Perc Cholecystostomy  Result Date: 02/09/2020 INDICATION: 64 year old male with acute cholecystitis, poor operative candidate. EXAM: Fluoroscopic and ultrasound-guided placement of cholecystostomy tube MEDICATIONS: The patient is receiving intravenous Zosyn  as an inpatient. ANESTHESIA/SEDATION: Moderate (conscious) sedation was employed during this procedure. A total of Versed 0.5 mg and Fentanyl 25 mcg was administered intravenously. Moderate Sedation Time: 10 minutes. The patient's level of consciousness and vital signs were monitored continuously by radiology nursing throughout the procedure under my direct supervision. FLUOROSCOPY TIME:  Fluoroscopy Time: 0.7 minutes (14 mGy). COMPLICATIONS: None immediate. PROCEDURE: Informed written consent was obtained from the patient after a thorough discussion of the procedural risks, benefits and alternatives. All questions were addressed. Maximal Sterile Barrier Technique was utilized including caps, mask, sterile gowns, sterile gloves, sterile drape, hand hygiene and skin antiseptic. A timeout was performed prior to the initiation of the procedure. The patient was placed supine on the angiographic table. The patient's right upper quadrant was then prepped and draped in normal sterile fashion with maximum sterile barrier. Ultrasound demonstrates a distended gallbladder. Subdermal Local anesthesia was provided at the planned skin entry site. Under ultrasound guidance, deeper local anesthetic was provided through intercostal muscles and along the liver capsule. Ultrasound was used to puncture the gallbladder using a 21 gauge Chiba needle via a transhepatic approach. A 0.018 inch wire was advanced into the lumen and a transition dilator placed. A gentle hand injection of contrast was performed. Cholecystogram demonstrates a distended gallbladder without definite evidence of gallstones. The cystic duct is obstructed. A 0.035 inch exchange wire was placed in the tract was dilated. A 10.2 French multipurpose drainage catheter was advanced into the gallbladder lumen. The drain was then secured in place using a 0-silk suture and a Stayfix device. A sterile dressing was applied. The tube was placed to bag drainage. A culture was  sent to the lab for analysis. The patient tolerated procedure well without evidence of immediate complication was transferred back to the floor in stable condition. IMPRESSION: Successful placement of a 10.2 French percutaneous, transhepatic cholecystostomy tube. PLAN: Return in 6 to 8 weeks for routine cholecystostomy tube check and change pending interim surgical intervention. Ruthann Cancer, MD [facility] Electronically Signed   By: Ruthann Cancer MD   On: 02/09/2020 15:34   Medications:  Scheduled Meds: . chlorhexidine gluconate (MEDLINE KIT)  15 mL Mouth Rinse BID  . Chlorhexidine Gluconate Cloth  6 each Topical Daily  . enoxaparin (LOVENOX) injection  0.5 mg/kg Subcutaneous Q24H  . feeding supplement (PROSource TF)  90 mL Per Tube TID  . feeding supplement (VITAL HIGH PROTEIN)  1,000 mL Per Tube Q24H  . gabapentin  1,600 mg Per Tube BID  . insulin aspart  0-15 Units Subcutaneous Q4H  . insulin glargine  10 Units Subcutaneous QHS  . mouth rinse  15 mL Mouth Rinse 10 times per day  . pantoprazole sodium  40 mg Per Tube BID   Continuous Infusions: . sodium chloride Stopped (02/10/20 0904)  . fentaNYL infusion INTRAVENOUS  125 mcg/hr (02/10/20 1300)  . piperacillin-tazobactam (ZOSYN)  IV 12.5 mL/hr at 02/10/20 1300  . propofol (DIPRIVAN) infusion 45 mcg/kg/min (02/10/20 1300)   PRN Meds:.sodium chloride, acetaminophen (TYLENOL) oral liquid 160 mg/5 mL, nitroGLYCERIN, ondansetron **OR** ondansetron (ZOFRAN) IV   Assessment: Principal Problem:   Acute acalculous cholecystitis Active Problems:   Benign essential HTN   History of Meniere's disease   Diabetes mellitus, type II (HCC)   CAD (coronary artery disease)   CKD (chronic kidney disease) stage 3, GFR 30-59 ml/min (HCC)   Hyperkalemia   Severe sepsis ([place])    Plan: Patient with acalculus cholecystitis with GI consulted for elevated liver enzymes, now status post  cholecystostomy tube placementWith improvement in liver enzymes  Monitor liver enzymes once a day They are improving at this time Avoid hepatotoxic drugs Viral hepatitis work-up has been negative so far.  Some of the autoimmune labs are pending.  However, etiology remains to be his cholecystitis  Recommend nephrology referral for kidney mass  Dr. Marius Ditch to follow from tomorrow   LOS: 3 days   [name], MD 02/10/2020, 2:26 PM

## 2020-02-10 NOTE — Consult Note (Signed)
Urology Consult  Requesting physician: Ottie Glazier, MD  Reason for consultation: Left renal mass  Chief Complaint: N/A  History of Present Illness: Kenneth Osborn is a 64 y.o. male admitted 02/07/2020 with a sepsis secondary to acute acalculus cholecystitis.  He underwent percutaneous cholecystostomy tube placement by IR on 12/22 and post procedure developed septicemia with hypoxia and was transferred to the ICU and subsequently intubated.  CT abdomen/pelvis with contrast on admission incidentally showed a 5.6 x 5.9 cm heterogeneously enhancing left upper pole renal mass.  He remains intubated and sedated and unable to provide a history.  Past Medical History:  Diagnosis Date  . Acute postoperative pain 12/03/2018  . Allergic rhinitis 12/30/2012  . Anginal pain ([place])   . Bronchitis    hx of  . Can't get food down 08/12/2014  . Chronic back pain    thoracic area  . Concussion 09/2015  . COPD (chronic obstructive pulmonary disease) ([place])   . Coronary artery disease    99% blockage  . DDD (degenerative disc disease), cervical   . Dehydration symptoms    2019  . 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 Dysphagia   . GERD (gastroesophageal reflux disease)   . History of Meniere's disease 12/21/2014  . History of thoracic spine surgery (S/P T9-10 IVD spacer) 12/21/2014  . Hypercholesteremia   . Hyperlipidemia   . Hypertension    sees Dr. Jenny Reichmann walker Jefm Bryant  . 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 Myocardial infarction [facility])    Sees Dr. Drema [place], [place] [facility]  . Neuromuscular disorder ([place])    diabetic neuropathy in feet  . Psychosis ([place])   . Short-segment Barrett's esophagus   . Sleep apnea    uses cpap and 2 L o2 at hs    Past Surgical History:  Procedure Laterality Date  . APPENDECTOMY    . ARTHRODESIS ANTERIOR ANTERIOR CERVICLE SPINE  01/04/2013  . BACK SURGERY     fusion thoracic area  . CARDIAC  CATHETERIZATION     may 2012 and Nov 20, 2010  . CARDIAC CATHETERIZATION N/A 06/15/2015   Procedure: Left Heart Cath and Coronary Angiography;  Surgeon: Corey Skains, MD;  Location: [place] CV LAB;  Service: Cardiovascular;  Laterality: N/A;  . CARDIAC CATHETERIZATION N/A 06/15/2015   Procedure: Coronary Stent Intervention;  Surgeon: Isaias Cowman, MD;  Location: [place] [facility];  Service: Cardiovascular;  Laterality: N/A;  . CARDIOVASCULAR STRESS TEST     jan 2014  . COLONOSCOPY WITH PROPOFOL N/A 09/19/2014   Procedure: COLONOSCOPY WITH PROPOFOL;  Surgeon: Manya Silvas, MD;  Location: [facility] ENDOSCOPY;  Service: Endoscopy;  Laterality: N/A;  . CORONARY ANGIOPLASTY     stent placement  . ESOPHAGOGASTRODUODENOSCOPY N/A 09/19/2014   Procedure: ESOPHAGOGASTRODUODENOSCOPY (EGD);  Surgeon: Manya Silvas, MD;  Location: [facility] ENDOSCOPY;  Service: Endoscopy;  Laterality: N/A;  . IR PERC CHOLECYSTOSTOMY  02/09/2020  . [place] right ear  . LUMBAR SPINAL CORD SIMULATOR LEAD REMOVAL Right 08/09/2019   Procedure: REMOVAL SPINAL CORD STIMULATOR PERCUTANEOUS LEADS, REMOVAL PULSE GENERATOR;  Surgeon: [name], MD;  Location: [facility] ORS;  Service: Neurosurgery;  Laterality: Right;  LOCAL WITH MAC  . mastoid shunt     left, 2002, 1997 right ear, 1980 right ear  . PULSE GENERATOR IMPLANT N/A 01/18/2019   Procedure: MEDTRONIC SPINAL CORD STIMULATOR BATTERY EXCHANGE;  Surgeon: [name],  MD;  Location: [facility] ORS;  Service: Neurosurgery;  Laterality: N/A;  . SAVORY DILATION N/A 09/19/2014   Procedure: SAVORY DILATION;  Surgeon: Manya Silvas, MD;  Location: [facility] ENDOSCOPY;  Service: Endoscopy;  Laterality: N/A;  . SHOULDER ARTHROSCOPY WITH SUBACROMIAL DECOMPRESSION Left 04/06/2012   Procedure: SHOULDER ARTHROSCOPY WITH SUBACROMIAL DECOMPRESSION;  Surgeon: Vickey Huger, MD;  Location: [place];  Service: Orthopedics;  Laterality: Left;  left shoulder arthroscopy, subacromial  decompression and distal clavicle resection  . SPINAL CORD STIMULATOR IMPLANT Right     Home Medications:  Current Meds  Medication Sig  . ALPRAZolam (XANAX) 0.5 MG tablet Take 0.5 mg by mouth at bedtime.  . cetirizine (ZYRTEC) 10 MG tablet Take 10 mg by mouth daily.   . cyclobenzaprine (FLEXERIL) 10 MG tablet Take 1 tablet (10 mg total) by mouth 3 (three) times daily as needed for muscle spasms.  . diazepam (VALIUM) 5 MG tablet Take 5 mg by mouth 3 (three) times daily as needed (Meniere's disease).   Marland Kitchen diltiazem (CARDIZEM CD) 240 MG 24 hr capsule Take 240 mg by mouth at bedtime.   . enalapril (VASOTEC) 10 MG tablet Take 10 mg by mouth 2 (two) times daily.   Marland Kitchen esomeprazole (NEXIUM) 40 MG capsule Take 40 mg by mouth 2 (two) times daily.  Mar[place] Kitchen ezetimibe (ZETIA) 10 MG tablet Take 10 mg by mouth at bedtime.   . gabapentin (NEURONTIN) 800 MG tablet Take 2 tablets (1,600 mg total) by mouth 2 (two) times daily.  Marland Kitchen HUMULIN R U-500 KWIKPEN 500 UNIT/ML kwikpen Inject 40 Units into the skin 3 (three) times daily with meals. (Patient taking differently: Inject 70-80 Units into the skin See admin instructions. Can adjust according to blood sugar Inject 80 units with breakfast, 80 units with lunch, 70 units with dinner)  . isosorbide mononitrate (IMDUR) 30 MG 24 hr tablet Take 30 mg by mouth at bedtime.  . Magnesium Oxide 500 MG TABS Take 1 tablet (500 mg total) by mouth daily.  . meclizine (ANTIVERT) 25 MG tablet Take 25 mg by mouth 3 (three) times daily as needed for dizziness.   . metFORMIN (GLUCOPHAGE) 1000 MG tablet Take 1,000 mg by mouth 2 (two) times daily with a meal.  . metoprolol succinate (TOPROL-XL) 100 MG 24 hr tablet Take 100 mg by mouth 2 (two) times daily. Take with or immediately following a meal.  . montelukast (SINGULAIR) 10 MG tablet Take 10 mg by mouth at bedtime.   . niacin (NIASPAN) 500 MG CR tablet Take 500 mg by mouth at bedtime.  . Omega-3 Fatty Acids (FISH OIL) 1200 MG CAPS Take  1,200 mg by mouth 2 (two) times daily.  . Oxycodone HCl 10 MG TABS Take 1 tablet (10 mg total) by mouth every 6 (six) hours as needed. Must last 30 days  . prasugrel (EFFIENT) 10 MG TABS tablet Take 10 mg by mouth daily.  . rosuvastatin (CRESTOR) 40 MG tablet Take 40 mg by mouth at bedtime.    Allergies: No Known Allergies  Family History  Problem Relation Age of Onset  . Heart disease Mother   . Diabetes Mother   . Heart disease Father   . Cancer Sister   . Heart disease Maternal Aunt   . Heart disease Maternal Uncle   . Diabetes Maternal Grandmother   . Diabetes Paternal Grandmother     Social History:  reports that he has never smoked. He has never used smokeless tobacco. He reports that he does not  drink alcohol and does not use drugs.  ROS: A complete review of systems was performed.  All systems are negative except for pertinent findings as noted.  Physical Exam:  Vital signs in last 24 hours: Temp:  [97.6 F (36.4 C)-100.7 F (38.2 C)] 98.6 F (37 C) (12/23 1600) Pulse Rate:  [75-117] 80 (12/23 1800) Resp:  [20-23] 20 (12/23 1800) BP: (92-130)/(58-84) 100/60 (12/23 1800) SpO2:  [97 %-98 %] 97 % (12/23 1800) FiO2 (%):  [40 %] 40 % (12/23 1325) Constitutional: Intubated and sedated  Laboratory Data:  Recent Labs    02/08/20 0424 02/09/20 0604 02/10/20 0548  WBC 8.9 7.6 7.1  HGB 13.2 12.6* 12.1*  HCT 39.3 38.0* 36.1*   Recent Labs    02/08/20 0424 02/09/20 0604 02/10/20 0548  NA 136 137 138  K 4.5 4.5 4.3  CL 103 104 103  CO2 24 21* 26  GLUCOSE 252* 328* 263*  BUN 26* 21 20  CREATININE 1.40* 1.22 1.21  CALCIUM 9.4 9.5 9.2   Recent Labs    02/08/20 0424  INR 1.4*   No results for input(s): LABURIN in the last 72 hours. Results for orders placed or performed during the hospital encounter of 02/07/20  Resp Panel by RT-PCR (Flu A&B, Covid) Nasopharyngeal Swab     Status: None   Collection Time: 02/07/20  5:46 PM   Specimen: Nasopharyngeal Swab;  Nasopharyngeal(NP) swabs in vial transport medium  Result Value Ref Range Status   SARS Coronavirus 2 by RT PCR NEGATIVE NEGATIVE Final    Comment: (NOTE) SARS-CoV-2 target nucleic acids are NOT DETECTED.  The SARS-CoV-2 RNA is generally detectable in upper respiratory specimens during the acute phase of infection. The lowest concentration of SARS-CoV-2 viral copies this assay can detect is 138 copies/mL. A negative result does not preclude SARS-Cov-2 infection and should not be used as the sole basis for treatment or other patient management decisions. A negative result may occur with  improper specimen collection/handling, submission of specimen other than nasopharyngeal swab, presence of viral mutation(s) within the areas targeted by this assay, and inadequate number of viral copies(<138 copies/mL). A negative result must be combined with clinical observations, patient history, and epidemiological information. The expected result is Negative.  Fact Sheet for Patients:  EntrepreneurPulse.com.au  Fact Sheet for Healthcare Providers:  IncredibleEmployment.be  This test is no t yet approved or cleared by the [place] FDA and  has been authorized for detection and/or diagnosis of SARS-CoV-2 by FDA under an Emergency Use Authorization (EUA). This EUA will remain  in effect (meaning this test can be used) for the duration of the COVID-19 declaration under Section 564(b)(1) of the Act, 21 U.S.C.section 360bbb-3(b)(1), unless the authorization is terminated  or revoked sooner.       Influenza A by PCR NEGATIVE NEGATIVE Final   Influenza B by PCR NEGATIVE NEGATIVE Final    Comment: (NOTE) The Xpert Xpress SARS-CoV-2/FLU/RSV plus assay is intended as an aid in the diagnosis of influenza from Nasopharyngeal swab specimens and should not be used as a sole basis for treatment. Nasal washings and aspirates are unacceptable for Xpert Xpress  SARS-CoV-2/FLU/RSV testing.  Fact Sheet for Patients: EntrepreneurPulse.com.au  Fact Sheet for Healthcare Providers: IncredibleEmployment.be  This test is not yet approved or cleared by the [place] FDA and has been authorized for detection and/or diagnosis of SARS-CoV-2 by FDA under an Emergency Use Authorization (EUA). This EUA will remain in effect (meaning this test can be used) for  the duration of the COVID-19 declaration under Section 564(b)(1) of the Act, 21 U.S.C. section 360bbb-3(b)(1), unless the authorization is terminated or revoked.  Performed at [facility], [address] [place], [place] [address]   Culture, blood (Routine X 2) w Reflex to ID Panel     Status: None (Preliminary result)   Collection Time: 02/07/20  5:46 PM   Specimen: BLOOD  Result Value Ref Range Status   Specimen Description BLOOD BLOOD RIGHT FOREARM  Final   Special Requests   Final    BOTTLES DRAWN AEROBIC AND ANAEROBIC Blood Culture adequate volume   Culture   Final    NO GROWTH 3 DAYS Performed at [facility], [address] [place], [place] [address]    Report Status PENDING  Incomplete  Culture, blood (Routine X 2) w Reflex to ID Panel     Status: None (Preliminary result)   Collection Time: 02/07/20  5:46 PM   Specimen: BLOOD  Result Value Ref Range Status   Specimen Description BLOOD LEFT ANTECUBITAL  Final   Special Requests   Final    BOTTLES DRAWN AEROBIC AND ANAEROBIC Blood Culture adequate volume   Culture   Final    NO GROWTH 3 DAYS Performed at [facility], [address] [place], [place] [address]    Report Status PENDING  Incomplete  Urine Culture     Status: Abnormal   Collection Time: 02/07/20  5:46 PM   Specimen: Urine, Random  Result Value Ref Range Status   Specimen Description   Final    URINE, RANDOM Performed at [facility], [address], [place], [place] [address]     Special Requests   Final    NONE Performed at [facility], [address], [place], [place] [address]    Culture (A)  Final    <10,000 COLONIES/mL INSIGNIFICANT GROWTH Performed at [facility], [place] [address], [place], [place] [address]    Report Status 02/08/2020 FINAL  Final  Aerobic/Anaerobic Culture (surgical/deep wound)     Status: None (Preliminary result)   Collection Time: 02/09/20  3:00 PM   Specimen: Gallbladder; Abscess  Result Value Ref Range Status   Specimen Description   Final    GALL BLADDER Performed at [facility], [address], [place], [place] [address]    Special Requests   Final    NONE Performed at [facility], [address] [place], [place] [address]    Gram Stain   Final    ABUNDANT WBC PRESENT, PREDOMINANTLY PMN MODERATE GRAM NEGATIVE RODS Performed at [facility], [place] [address], [place], [place] [address]    Culture FEW KLEBSIELLA PNEUMONIAE  Final   Report Status PENDING  Incomplete  MRSA PCR Screening     Status: None   Collection Time: 02/10/20 12:00 AM   Specimen: Nasal Mucosa; Nasopharyngeal  Result Value Ref Range Status   MRSA by PCR NEGATIVE NEGATIVE Final    Comment:        The GeneXpert MRSA Assay (FDA approved for NASAL specimens only), is one component of a comprehensive MRSA colonization surveillance program. It is not intended to diagnose MRSA infection nor to guide or monitor treatment for MRSA infections. Performed at [facility], [address] [place], [place] [address]      Radiologic Imaging: CT images were personally reviewed and interpreted  CT ABDOMEN PELVIS WO CONTRAST  Result Date: 02/09/2020 CLINICAL DATA:  Abdominal distension. Cholecystostomy tube placement today. EXAM: CT ABDOMEN AND PELVIS WITHOUT CONTRAST TECHNIQUE: Multidetector CT imaging of the abdomen and pelvis was performed following the standard protocol without IV contrast. COMPARISON:   CT abdomen pelvis dated February 07, 2020. FINDINGS: Lower chest: No acute abnormality. Subsegmental atelectasis in both lower lobes. Hepatobiliary: No focal liver abnormality. Interval placement of a transhepatic cholecystostomy tube within the gallbladder lumen. The gallbladder remains distended and contains new high density material. No biliary dilatation. Pancreas: Unremarkable. No pancreatic ductal dilatation or surrounding inflammatory changes. Spleen: Normal in size without focal abnormality. Adrenals/Urinary Tract: The adrenal glands are unremarkable. 5.8 x 5.7 cm solid mass arising from the upper pole of the left kidney is grossly unchanged but better evaluated on prior contrast enhanced study. Unchanged 1.9 cm simple cyst in the lateral midpole of the left kidney. No renal calculi or hydronephrosis. The bladder is unremarkable. Stomach/Bowel: Stomach is within normal limits. Prior appendectomy. No evidence of bowel wall thickening, distention, or inflammatory changes. Vascular/Lymphatic: Aortic atherosclerosis. No enlarged abdominal or pelvic lymph nodes. Reproductive: Prostate is unremarkable. Other: No free fluid or pneumoperitoneum. Musculoskeletal: No acute or significant osseous findings. IMPRESSION: 1. Interval placement of a transhepatic cholecystostomy tube within the gallbladder lumen. The gallbladder remains distended with some high density material, likely sludge and/or a small amount of post-procedure hematoma. No evident complication or new acute intra-abdominal process. 2. Unchanged 5.8 cm solid mass arising from the upper pole of the left kidney, better evaluated on prior contrast enhanced study, consistent with renal cell carcinoma. 3. Aortic Atherosclerosis (ICD10-I70.0). Electronically Signed   By: Titus Dubin M.D.   On: 02/09/2020 16:26   DG Chest 1 View  Result Date: 02/09/2020 CLINICAL DATA:  Intubated, recent cholecystostomy tube placement EXAM: CHEST  1 VIEW COMPARISON:   02/07/2020 FINDINGS: Single frontal view of the chest demonstrates endotracheal tube overlying tracheal air column, tip just below thoracic inlet. The cardiac silhouette is unremarkable. Patchy retrocardiac consolidation likely reflects atelectasis. No effusion or pneumothorax. IMPRESSION: 1. No complication after intubation. 2. Minimal left basilar atelectasis. Electronically Signed   By: Randa Ngo M.D.   On: 02/09/2020 18:38   DG Abd 1 View  Result Date: 02/09/2020 CLINICAL DATA:  OG tube placement EXAM: ABDOMEN - 1 VIEW COMPARISON:  CT dated February 07, 2020 FINDINGS: The OG tube projects over the gastric body. There is a percutaneous drain in the right upper quadrant likely representing the patient's cholecystostomy tube. The bowel gas pattern is nonobstructive. IMPRESSION: OG tube projects over the gastric body. Electronically Signed   By: Constance Holster M.D.   On: 02/09/2020 21:17   IR Perc Cholecystostomy  Result Date: 02/09/2020 INDICATION: 64 year old male with acute cholecystitis, poor operative candidate. EXAM: Fluoroscopic and ultrasound-guided placement of cholecystostomy tube MEDICATIONS: The patient is receiving intravenous Zosyn as an inpatient. ANESTHESIA/SEDATION: Moderate (conscious) sedation was employed during this procedure. A total of Versed 0.5 mg and Fentanyl 25 mcg was administered intravenously. Moderate Sedation Time: 10 minutes. The patient's level of consciousness and vital signs were monitored continuously by radiology nursing throughout the procedure under my direct supervision. FLUOROSCOPY TIME:  Fluoroscopy Time: 0.7 minutes (14 mGy). COMPLICATIONS: None immediate. PROCEDURE: Informed written consent was obtained from the patient after a thorough discussion of the procedural risks, benefits and alternatives. All questions were addressed. Maximal Sterile Barrier Technique was utilized including caps, mask, sterile gowns, sterile gloves, sterile drape, hand hygiene  and skin antiseptic. A timeout was performed prior to the initiation of  the procedure. The patient was placed supine on the angiographic table. The patient's right upper quadrant was then prepped and draped in normal sterile fashion with maximum sterile barrier. Ultrasound demonstrates a distended gallbladder. Subdermal Local anesthesia was provided at the planned skin entry site. Under ultrasound guidance, deeper local anesthetic was provided through intercostal muscles and along the liver capsule. Ultrasound was used to puncture the gallbladder using a 21 gauge Chiba needle via a transhepatic approach. A 0.018 inch wire was advanced into the lumen and a transition dilator placed. A gentle hand injection of contrast was performed. Cholecystogram demonstrates a distended gallbladder without definite evidence of gallstones. The cystic duct is obstructed. A 0.035 inch exchange wire was placed in the tract was dilated. A 10.2 French multipurpose drainage catheter was advanced into the gallbladder lumen. The drain was then secured in place using a 0-silk suture and a Stayfix device. A sterile dressing was applied. The tube was placed to bag drainage. A culture was sent to the lab for analysis. The patient tolerated procedure well without evidence of immediate complication was transferred back to the floor in stable condition. IMPRESSION: Successful placement of a 10.2 French percutaneous, transhepatic cholecystostomy tube. PLAN: Return in 6 to 8 weeks for routine cholecystostomy tube check and change pending interim surgical intervention. Ruthann Cancer, MD [facility] Electronically Signed   By: Ruthann Cancer MD   On: 02/09/2020 15:34    Impression/Recommendation:   1.  Left renal mass  I discussed CT findings with patient's wife and that radiographically the mass is consistent with renal cell carcinoma.  No definite evidence of metastatic disease.  We  discussed the standard treatment of laparoscopic radical nephrectomy.  Partial nephrectomy was also discussed however the mass does approach the midpole in close proximity to the collecting system.  Renal function was normal prior to his hospitalization.  He is responding to antibiotics and cholecystostomy drainage clinically and general surgery has no plans for urgent intervention.  His wife states general surgery has recommended cholecystectomy once he recovers from this acute episode of sepsis a joint procedure under the same anesthetic would be ideal.  As I am not performing laparoscopic surgery will arrange follow-up with one of my partners.    02/10/2020, 6:33 PM  John Giovanni,  MD

## 2020-02-10 NOTE — Progress Notes (Signed)
Inpatient Diabetes Program Recommendations  AACE/ADA: New Consensus Statement on Inpatient Glycemic Control (2015)  Target Ranges:  Prepandial:   less than 140 mg/dL      Peak postprandial:   less than 180 mg/dL (1-2 hours)      Critically ill patients:  140 - 180 mg/dL   Results for RICKIE, GANGE" (MRN [id number]) as of 02/10/2020 08:43  Ref. Range 02/09/2020 07:44 02/09/2020 12:22 02/09/2020 13:58 02/09/2020 15:40 02/09/2020 17:06 02/09/2020 19:29 02/09/2020 23:16  Glucose-Capillary Latest Ref Range: 70 - 99 mg/dL 314 (H)  11 units NOVOLOG  259 (H)  8 units NOVOLOG  267 (H) 225 (H) 231 (H) 250 (H)  5 units NOVOLOG  269 (H)  8 units NOVOLOG  10 units LANTUS     Yesterday (12/22) afternoon underwent percutaneous cholecystostomy tube placement with IR which revealed purulent aspirate from the gallbladder. Following this, he appeared to develop septicemia with hypoxia, tachycardia, tachypnea, and was febrile. PCCM was consulted and he was transferred to ICU and subsequently intubated   Home DM Meds: Concentrated U500 Regular Insulin       80 units with Breakfast/ 80 units with Lunch/ 70 units with Dinner       Metformin 1000 mg BID  Current Orders: Lantus 10 units QHS      Novolog Moderate Correction Scale/ SSI (0-15 units) Q4 hours    Endocrinologist: Dr. Honor Junes with [facility]--last seen 11/11/2019--Was told to change his U500 Insulin to the following:  90 units with Breakfast/ 70 units with Lunch/ 65 units with Dinner    MD- Note that patient takes Concentrated U500 Regular Insulin at home.  This insulin is 5x as concentrated as regular U100 insulin.  Note that Lantus 10 units QHS started last PM--expect pt will need more basal  Please consider:  1. Increase Lantus to 25 units QHS (0.25 units/kg)  Please consider ordering an extra 15 units Lantus X 1 dose to be given this AM since pt only received 10 units Lantus last PM  2. Increase Novolog SSi to the  Resistant scale (0-20 units) Q4 hours    --Will follow patient during hospitalization--  Wyn Quaker RN, MSN, CDE Diabetes Coordinator Inpatient Glycemic Control Team Team Pager: [phone] (8a-5p)

## 2020-02-10 NOTE — Care Plan (Signed)
Patient was intubated and admitted to ICU yesterday.  We will sign off.  we will take over once patient is extubated and out of ICU.

## 2020-02-11 LAB — CBC
HCT: 34.2 % — ABNORMAL LOW (ref 39.0–52.0)
Hemoglobin: 11.4 g/dL — ABNORMAL LOW (ref 13.0–17.0)
MCH: 27.9 pg (ref 26.0–34.0)
MCHC: 33.3 g/dL (ref 30.0–36.0)
MCV: 83.6 fL (ref 80.0–100.0)
Platelets: 179 10*3/uL (ref 150–400)
RBC: 4.09 MIL/uL — ABNORMAL LOW (ref 4.22–5.81)
RDW: 15.1 % (ref 11.5–15.5)
WBC: 6.6 10*3/uL (ref 4.0–10.5)
nRBC: 0 % (ref 0.0–0.2)

## 2020-02-11 LAB — GLUCOSE, CAPILLARY
Glucose-Capillary: 226 mg/dL — ABNORMAL HIGH (ref 70–99)
Glucose-Capillary: 249 mg/dL — ABNORMAL HIGH (ref 70–99)
Glucose-Capillary: 232 mg/dL — ABNORMAL HIGH (ref 70–99)
Glucose-Capillary: 270 mg/dL — ABNORMAL HIGH (ref 70–99)
Glucose-Capillary: 222 mg/dL — ABNORMAL HIGH (ref 70–99)
Glucose-Capillary: 239 mg/dL — ABNORMAL HIGH (ref 70–99)

## 2020-02-11 LAB — COMPREHENSIVE METABOLIC PANEL
ALT: 222 U/L — ABNORMAL HIGH (ref 0–44)
AST: 42 U/L — ABNORMAL HIGH (ref 15–41)
Albumin: 2.5 g/dL — ABNORMAL LOW (ref 3.5–5.0)
Alkaline Phosphatase: 177 U/L — ABNORMAL HIGH (ref 38–126)
Anion gap: 7 (ref 5–15)
BUN: 27 mg/dL — ABNORMAL HIGH (ref 8–23)
CO2: 27 mmol/L (ref 22–32)
Calcium: 8.9 mg/dL (ref 8.9–10.3)
Chloride: 105 mmol/L (ref 98–111)
Creatinine, Ser: 1.02 mg/dL (ref 0.61–1.24)
GFR, Estimated: >60 mL/min (ref >60)
Glucose, Bld: 262 mg/dL — ABNORMAL HIGH (ref 70–99)
Potassium: 3.6 mmol/L (ref 3.5–5.1)
Sodium: 139 mmol/L (ref 135–145)
Total Bilirubin: 1.2 mg/dL (ref 0.3–1.2)
Total Protein: 6.1 g/dL — ABNORMAL LOW (ref 6.5–8.1)

## 2020-02-11 LAB — HSV(HERPES SIMPLEX VRS) I + II AB-IGM: HSVI/II Comb IgM: <0.91 Ratio (ref 0.00–0.90)

## 2020-02-11 LAB — MAGNESIUM: Magnesium: 1.9 mg/dL (ref 1.7–2.4)

## 2020-02-11 NOTE — Progress Notes (Signed)
Chart reviewed, LFTs continue to improve. Secondary liver disease work-up is negative to date. Elevated LFT secondary to cholecystitis status post cholecystostomy tube placement No further recommendations from GI standpoint at this time GI will sign off, please call us back with questions or concerns   Cephas Darby, MD [address]  [place], [place] [address]  Main: [phone]  Fax: [phone] Pager: [phone]

## 2020-02-11 NOTE — Progress Notes (Signed)
CRITICAL CARE NOTE    Name: Kenneth Osborn MRN: [id number] DOB: August 22, 1955     LOS: 4     SUBJECTIVE FINDINGS & SIGNIFICANT EVENTS    Patient description:   64 yo male with PMH of coronary artery disease status post stent angioplasty, COPD, chronic low back pain, GERD, hypertension, diabetes mellitus with complications of stage III kidney disease and Mnire's disease who presents to the emergency room for evaluation of chest pain.  He reported in ED that pain is mostly across his chest with radiation to the right arm associated with fever, chills and fatigue.  He complained of nausea but denies having any abdominal pain.  Patient felt that he may have infection with coronavirus but denies having any known exposure.  He denied having any diaphoresis, no palpitations, no shortness of breath but he has felt dizzy and lightheaded.  He denied having any diarrhea, no constipation, no melena stools, no hematochezia, no urinary frequency, no nocturia, no cough, no headache.  He has chronic low back pain but has had multiple back surgeries and stated that there has not been a change in the intensity of his pain.  In the ER he was noted to have a fever with a T-max of 101.4 F, he was tachycardic and tachypneic as well.  Labs show sodium 133, calcium 9.9, chloride 99, bicarbonate 20, glucose 240, BUN 35, creatinine 1.84, calcium 10, anion gap 15, alkaline phosphatase 115, albumin 4.2, lipase 22, AST 901, ALT 1275, total protein 7.6, bilirubin 3.6 troponin 8, lactic acid 3.1, white count 10.9, hemoglobin 15.9 hematocrit 47.3, MCV 82, RDW 13.8, platelet count 212, PT 11.9, INR 0.9  Respiratory viral panel is negative  Gallbladder ultrasound shows gallbladder sludge with findings that may represent sequelae of acalculus cholecystitis CT  scan of abdomen and pelvis showed mild haziness of the gallbladder wall and pericholecystic fat. No calcified gallstones. Ultrasound may provide better evaluation of the gallbladder if there is a concern for acute cholecystitis. 5.6 x 5.9 heterogeneously enhancing mass in the upper pole of the left kidney most concerning for malignancy.  CT angiogram of the chest is negative for pulmonary embolism  Chest x-ray reviewed by me shows clear lungs  Twelve-lead EKG reviewed by me shows sinus tachycardia with a left posterior fascicular block  Patient is a 64 year old patient male who presents to the emergency room for evaluation of chest pain was noted to be febrile, tachycardic and tachypneic with an elevated lactic acid level. White count is slightly elevated at 10.9 and source of sepsis appears to be secondary to acute acalculous cholecystitis.  He will be admitted to the hospital for further evaluation.    Patient was in PACU for cholecystostomy tube placement but continued to deteriorate and required increased O2 as well as decreasing mentation.  During my evaluation patients wife was present and was able to give additional details.  Patient is unresponsive with severe acute hypoxemic respiratory failure and in circulatory shock.   PCCM consultation for likely septic shock with multiorgan failure in acutely comatose state.     02/10/20- patient on MV RASS-1. Awakening trial following verbal communication. RUQ drain with less blood more serosang.  Consulted urology for L renal mass - spoke with Dr Bernardo Heater.  Patient seen by surgery today - spoke with Dr Christian Mate.    02/11/20- patient is on 40% FiO2. Spoke with wife and reviewed care plan and answered questions.    Lines/tubes : Biliary Tube RUQ (Active)  Microbiology/Sepsis markers: Results for orders placed or performed during the hospital encounter of 02/07/20  Resp Panel by RT-PCR (Flu A&B, Covid) Nasopharyngeal Swab     Status: None    Collection Time: 02/07/20  5:46 PM   Specimen: Nasopharyngeal Swab; Nasopharyngeal(NP) swabs in vial transport medium  Result Value Ref Range Status   SARS Coronavirus 2 by RT PCR NEGATIVE NEGATIVE Final    Comment: (NOTE) SARS-CoV-2 target nucleic acids are NOT DETECTED.  The SARS-CoV-2 RNA is generally detectable in upper respiratory specimens during the acute phase of infection. The lowest concentration of SARS-CoV-2 viral copies this assay can detect is 138 copies/mL. A negative result does not preclude SARS-Cov-2 infection and should not be used as the sole basis for treatment or other patient management decisions. A negative result may occur with  improper specimen collection/handling, submission of specimen other than nasopharyngeal swab, presence of viral mutation(s) within the areas targeted by this assay, and inadequate number of viral copies(<138 copies/mL). A negative result must be combined with clinical observations, patient history, and epidemiological information. The expected result is Negative.  Fact Sheet for Patients:  EntrepreneurPulse.com.au  Fact Sheet for Healthcare Providers:  IncredibleEmployment.be  This test is no t yet approved or cleared by the [place] FDA and  has been authorized for detection and/or diagnosis of SARS-CoV-2 by FDA under an Emergency Use Authorization (EUA). This EUA will remain  in effect (meaning this test can be used) for the duration of the COVID-19 declaration under Section 564(b)(1) of the Act, 21 U.S.C.section 360bbb-3(b)(1), unless the authorization is terminated  or revoked sooner.       Influenza A by PCR NEGATIVE NEGATIVE Final   Influenza B by PCR NEGATIVE NEGATIVE Final    Comment: (NOTE) The Xpert Xpress SARS-CoV-2/FLU/RSV plus assay is intended as an aid in the diagnosis of influenza from Nasopharyngeal swab specimens and should not be used as a sole basis for treatment.  Nasal washings and aspirates are unacceptable for Xpert Xpress SARS-CoV-2/FLU/RSV testing.  Fact Sheet for Patients: EntrepreneurPulse.com.au  Fact Sheet for Healthcare Providers: IncredibleEmployment.be  This test is not yet approved or cleared by the [place] FDA and has been authorized for detection and/or diagnosis of SARS-CoV-2 by FDA under an Emergency Use Authorization (EUA). This EUA will remain in effect (meaning this test can be used) for the duration of the COVID-19 declaration under Section 564(b)(1) of the Act, 21 U.S.C. section 360bbb-3(b)(1), unless the authorization is terminated or revoked.  Performed at [facility], [address] [place], [place] [address]   Culture, blood (Routine X 2) w Reflex to ID Panel     Status: None (Preliminary result)   Collection Time: 02/07/20  5:46 PM   Specimen: BLOOD  Result Value Ref Range Status   Specimen Description BLOOD BLOOD RIGHT FOREARM  Final   Special Requests   Final    BOTTLES DRAWN AEROBIC AND ANAEROBIC Blood Culture adequate volume   Culture   Final    NO GROWTH 4 DAYS Performed at [facility], [address] [place], [place] [address]    Report Status PENDING  Incomplete  Culture, blood (Routine X 2) w Reflex to ID Panel     Status: None (Preliminary result)   Collection Time: 02/07/20  5:46 PM   Specimen: BLOOD  Result Value Ref Range Status   Specimen Description BLOOD LEFT ANTECUBITAL  Final   Special Requests   Final    BOTTLES DRAWN AEROBIC AND ANAEROBIC  Blood Culture adequate volume   Culture   Final    NO GROWTH 4 DAYS Performed at [facility], [address] [place], [place] [address]    Report Status PENDING  Incomplete  Urine Culture     Status: Abnormal   Collection Time: 02/07/20  5:46 PM   Specimen: Urine, Random  Result Value Ref Range Status   Specimen Description   Final    URINE, RANDOM Performed at [facility], [address] [place], [place] [address]    Special Requests   Final    NONE Performed at [facility], [address], [place], [place] [address]    Culture (A)  Final    <10,000 COLONIES/mL INSIGNIFICANT GROWTH Performed at [facility], [place] [address], [place], [place] [address]    Report Status 02/08/2020 FINAL  Final  Aerobic/Anaerobic Culture (surgical/deep wound)     Status: None (Preliminary result)   Collection Time: 02/09/20  3:00 PM   Specimen: Gallbladder; Abscess  Result Value Ref Range Status   Specimen Description   Final    GALL BLADDER Performed at [facility], [address] [place], [place] [address]    Special Requests   Final    NONE Performed at [facility], [address] [place], [place] [address]    Gram Stain   Final    ABUNDANT WBC PRESENT, PREDOMINANTLY PMN MODERATE GRAM NEGATIVE RODS Performed at [facility], [place] [address] [place], [place] [address]    Culture FEW KLEBSIELLA PNEUMONIAE  Final   Report Status PENDING  Incomplete  MRSA PCR Screening     Status: None   Collection Time: 02/10/20 12:00 AM   Specimen: Nasal Mucosa; Nasopharyngeal  Result Value Ref Range Status   MRSA by PCR NEGATIVE NEGATIVE Final    Comment:        The GeneXpert MRSA Assay (FDA approved for NASAL specimens only), is one component of a comprehensive MRSA colonization surveillance program. It is not intended to diagnose MRSA infection nor to guide or monitor treatment for MRSA infections. Performed at [facility], [address], [place],  [address]     Anti-infectives:  Anti-infectives (From admission, onward)   Start     Dose/Rate Route Frequency Ordered Stop   02/08/20 1800  cefTRIAXone (ROCEPHIN) 2 g in sodium chloride 0.9 % 100 mL IVPB  Status:  Discontinued        2 g 200 mL/hr over 30 Minutes Intravenous Every 24 hours 02/07/20 2230 02/07/20 2300   02/07/20 2330   piperacillin-tazobactam (ZOSYN) IVPB 3.375 g        3.375 g 12.5 mL/hr over 240 Minutes Intravenous Every 8 hours 02/07/20 2230     02/07/20 2230  metroNIDAZOLE (FLAGYL) IVPB 500 mg  Status:  Discontinued        500 mg 100 mL/hr over 60 Minutes Intravenous  Once 02/07/20 2223 02/07/20 2259   02/07/20 1930  cefTRIAXone (ROCEPHIN) 2 g in sodium chloride 0.9 % 100 mL IVPB        2 g 200 mL/hr over 30 Minutes Intravenous  Once 02/07/20 1918 02/07/20 2101       Consults: Treatment Team:  Ronny Bacon, MD Virgel Manifold, MD Abbie Sons, MD     PAST MEDICAL HISTORY   Past Medical History:  Diagnosis Date  . Acute postoperative pain 12/03/2018  . Allergic rhinitis 12/30/2012  . Anginal pain ([place])   .  Bronchitis    hx of  . Can't get food down 08/12/2014  . Chronic back pain    thoracic area  . Concussion 09/2015  . COPD (chronic obstructive pulmonary disease) ([place])   . Coronary artery disease    99% blockage  . DDD (degenerative disc disease), cervical   . Dehydration symptoms    2019  . 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 Dysphagia   . GERD (gastroesophageal reflux disease)   . History of Meniere's disease 12/21/2014  . History of thoracic spine surgery (S/P T9-10 IVD spacer) 12/21/2014  . Hypercholesteremia   . Hyperlipidemia   . Hypertension    sees Dr. Jenny Reichmann walker Jefm Bryant  . 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 Myocardial infarction [facility])    Sees Dr. Drema Dallas, [place] [facility]  . Neuromuscular disorder ([place])    diabetic neuropathy in feet  . Psychosis ([place])   . Short-segment Barrett's esophagus   . Sleep apnea    uses cpap and 2 L o2 at hs     SURGICAL HISTORY   Past Surgical History:  Procedure Laterality Date  . APPENDECTOMY    . ARTHRODESIS ANTERIOR ANTERIOR CERVICLE SPINE  01/04/2013  . BACK SURGERY     fusion thoracic area  . CARDIAC CATHETERIZATION     may 2012 and Nov 20, 2010   . CARDIAC CATHETERIZATION N/A 06/15/2015   Procedure: Left Heart Cath and Coronary Angiography;  Surgeon: Corey Skains, MD;  Location: [place] [facility];  Service: Cardiovascular;  Laterality: N/A;  . CARDIAC CATHETERIZATION N/A 06/15/2015   Procedure: Coronary Stent Intervention;  Surgeon: Isaias Cowman, MD;  Location: [place] [facility];  Service: Cardiovascular;  Laterality: N/A;  . CARDIOVASCULAR STRESS TEST     jan 2014  . COLONOSCOPY WITH PROPOFOL N/A 09/19/2014   Procedure: COLONOSCOPY WITH PROPOFOL;  Surgeon: Manya Silvas, MD;  Location: [facility] ENDOSCOPY;  Service: Endoscopy;  Laterality: N/A;  . CORONARY ANGIOPLASTY     stent placement  . ESOPHAGOGASTRODUODENOSCOPY N/A 09/19/2014   Procedure: ESOPHAGOGASTRODUODENOSCOPY (EGD);  Surgeon: Manya Silvas, MD;  Location: [facility]OSCOPY;  Service: Endoscopy;  Laterality: N/A;  . IR PERC CHOLECYSTOSTOMY  02/09/2020  . [place] right ear  . LUMBAR SPINAL CORD SIMULATOR LEAD REMOVAL Right 08/09/2019   Procedure: REMOVAL SPINAL CORD STIMULATOR PERCUTANEOUS LEADS, REMOVAL PULSE GENERATOR;  Surgeon: [name], MD;  Location: [facility] ORS;  Service: Neurosurgery;  Laterality: Right;  LOCAL WITH MAC  . mastoid shunt     left, 2002, 1997 right ear, 1980 right ear  . PULSE GENERATOR IMPLANT N/A 01/18/2019   Procedure: MEDTRONIC SPINAL CORD STIMULATOR BATTERY EXCHANGE;  Surgeon: [name], MD;  Location: [facility] ORS;  Service: Neurosurgery;  Laterality: N/A;  . SAVORY DILATION N/A 09/19/2014   Procedure: SAVORY DILATION;  Surgeon: Manya Silvas, MD;  Location: [facility] ENDOSCOPY;  Service: Endoscopy;  Laterality: N/A;  . SHOULDER ARTHROSCOPY WITH SUBACROMIAL DECOMPRESSION Left 04/06/2012   Procedure: SHOULDER ARTHROSCOPY WITH SUBACROMIAL DECOMPRESSION;  Surgeon: Vickey Huger, MD;  Location: [place];  Service: Orthopedics;  Laterality: Left;  left shoulder arthroscopy, subacromial decompression and distal clavicle resection  .  SPINAL CORD STIMULATOR IMPLANT Right      FAMILY HISTORY   Family History  Problem Relation Age of Onset  . Heart disease Mother   . Diabetes Mother   . Heart disease Father   . Cancer Sister   .  Heart disease Maternal Aunt   . Heart disease Maternal Uncle   . Diabetes Maternal Grandmother   . Diabetes Paternal Grandmother      SOCIAL HISTORY   Social History   Tobacco Use  . Smoking status: Never Smoker  . Smokeless tobacco: Never Used  Vaping Use  . Vaping Use: Never used  Substance Use Topics  . Alcohol use: No  . Drug use: No     MEDICATIONS   Current Medication:  Current Facility-Administered Medications:  .  0.9 %  sodium chloride infusion, , Intravenous, PRN, Shawna Clamp, MD, Stopped at 02/10/20 [phone] .  acetaminophen (TYLENOL) 160 MG/5ML solution 1,000 mg, 1,000 mg, Per Tube, TID PRN, Shawna Clamp, MD .  chlorhexidine gluconate (MEDLINE KIT) (PERIDEX) 0.12 % solution 15 mL, 15 mL, Mouth Rinse, BID, Blakeney, Dana G, NP, 15 mL at 02/11/20 0825 .  Chlorhexidine Gluconate Cloth 2 % PADS 6 each, 6 each, Topical, Daily, Awilda Bill, NP, 6 each at 02/10/20 1000 .  enoxaparin (LOVENOX) injection 50 mg, 0.5 mg/kg, Subcutaneous, Q24H, Enzo Bi, MD, 50 mg at 02/10/20 2104 .  feeding supplement (PROSource TF) liquid 90 mL, 90 mL, Per Tube, TID, Andria Head, MD, 90 mL at 02/11/20 0831 .  feeding supplement (VITAL HIGH PROTEIN) liquid 1,000 mL, 1,000 mL, Per Tube, Q24H, Golda Zavalza, MD, 1,000 mL at 02/10/20 1514 .  fentaNYL 2526mg in NS 258m(1038mml) infusion-PREMIX, 0-400 mcg/hr, Intravenous, Continuous, Dean Goldner, MD, Last Rate: 12.5 mL/hr at 02/11/20 0700, 125 mcg/hr at 02/11/20 0700 .  insulin aspart (novoLOG) injection 0-15 Units, 0-15 Units, Subcutaneous, Q4H, BlaAwilda BillP, 5 Units at 02/11/20 0829 .  insulin glargine (LANTUS) injection 10 Units, 10 Units, Subcutaneous, BID, AleOttie GlazierD, 10 Units at 02/11/20 0830 .  MEDLINE  mouth rinse, 15 mL, Mouth Rinse, 10 times per day, BlaAwilda BillP, 15 mL at 02/11/20 1020 .  nitroGLYCERIN (NITROSTAT) SL tablet 0.4 mg, 0.4 mg, Sublingual, Q5 min PRN, Agbata, Tochukwu, MD .  ondansetron (ZOFRAN) tablet 4 mg, 4 mg, Per Tube, Q6H PRN **OR** ondansetron (ZOFRAN) injection 4 mg, 4 mg, Intravenous, Q6H PRN, KumShawna ClampD .  pantoprazole sodium (PROTONIX) 40 mg/20 mL oral suspension 40 mg, 40 mg, Per Tube, BID, AleOttie GlazierD, 40 mg at 02/11/20 0832 .  piperacillin-tazobactam (ZOSYN) IVPB 3.375 g, 3.375 g, Intravenous, Q8H, Agbata, Tochukwu, MD, Last Rate: 12.5 mL/hr at 02/11/20 0700, Infusion Verify at 02/11/20 0700 .  propofol (DIPRIVAN) 1000 MG/100ML infusion, 5-80 mcg/kg/min, Intravenous, Titrated, Nichoel Digiulio, MD, Last Rate: 30.6 mL/hr at 02/11/20 0941, 50 mcg/kg/min at 02/11/20 0941    ALLERGIES   Patient has no known allergies.    REVIEW OF SYSTEMS     Unable to obtain patient is unresponsive  PHYSICAL EXAMINATION   Vital Signs: Temp:  [98.1 F (36.7 C)-98.7 F (37.1 C)] 98.6 F (37 C) (12/24 0400) Pulse Rate:  [72-90] 85 (12/24 0800) Resp:  [19-20] 20 (12/24 0800) BP: (94-126)/(58-71) 126/71 (12/24 0800) SpO2:  [97 %-99 %] 98 % (12/24 0832) FiO2 (%):  [40 %] 40 % (12/24 0832)  GENERAL:age appropriate HEAD: Normocephalic, atraumatic.  EYES: Pupils equal, round, reactive to light.  No scleral icterus.  MOUTH: Moist mucosal membrane. NECK: Supple. No thyromegaly. No nodules. No JVD.  PULMONARY: decreased air entry bilateraly  CARDIOVASCULAR: S1 and S2. Regular rate and rhythm. No murmurs, rubs, or gallops.  GASTROINTESTINAL: Soft, nontender, non-distended. No masses. Positive bowel sounds. No hepatosplenomegaly.  MUSCULOSKELETAL: No  swelling, clubbing, or edema.  NEUROLOGIC: encephalopathic GCS7 SKIN:intact,warm,dry   PERTINENT DATA     Infusions: . sodium chloride Stopped (02/10/20 0904)  . fentaNYL infusion INTRAVENOUS 125  mcg/hr (02/11/20 0700)  . piperacillin-tazobactam (ZOSYN)  IV 12.5 mL/hr at 02/11/20 0700  . propofol (DIPRIVAN) infusion 50 mcg/kg/min (02/11/20 0941)   Scheduled Medications: . chlorhexidine gluconate (MEDLINE KIT)  15 mL Mouth Rinse BID  . Chlorhexidine Gluconate Cloth  6 each Topical Daily  . enoxaparin (LOVENOX) injection  0.5 mg/kg Subcutaneous Q24H  . feeding supplement (PROSource TF)  90 mL Per Tube TID  . feeding supplement (VITAL HIGH PROTEIN)  1,000 mL Per Tube Q24H  . insulin aspart  0-15 Units Subcutaneous Q4H  . insulin glargine  10 Units Subcutaneous BID  . mouth rinse  15 mL Mouth Rinse 10 times per day  . pantoprazole sodium  40 mg Per Tube BID   PRN Medications: sodium chloride, acetaminophen (TYLENOL) oral liquid 160 mg/5 mL, nitroGLYCERIN, ondansetron **OR** ondansetron (ZOFRAN) IV Hemodynamic parameters:   Intake/Output: 12/23 0701 - 12/24 0700 In: 1219 [I.V.:1067; IV Piggyback:152] Out: 0263 [Urine:1135; Drains:140]  Ventilator  Settings: Vent Mode: PRVC FiO2 (%):  [40 %] 40 % Set Rate:  [20 bmp] 20 bmp Vt Set:  [500 mL] 500 mL PEEP:  [5 cmH20] 5 cmH20 Plateau Pressure:  [18 cmH20-19 cmH20] 18 cmH20    LAB RESULTS:  Basic Metabolic Panel: Recent Labs  Lab 02/07/20 1701 02/08/20 0424 02/09/20 0604 02/10/20 0548 02/11/20 0423  NA 133* 136 137 138 139  K 5.9* 4.5 4.5 4.3 3.6  CL 98 103 104 103 105  CO2 20* 24 21* 26 27  GLUCOSE 240* 252* 328* 263* 262*  BUN 35* 26* 21 20 27*  CREATININE 1.84* 1.40* 1.22 1.21 1.02  CALCIUM 10.0 9.4 9.5 9.2 8.9  MG  --   --  1.6* 1.9 1.9   Liver Function Tests: Recent Labs  Lab 02/07/20 1701 02/08/20 0424 02/09/20 0604 02/10/20 0548 02/11/20 0423  AST 901* 359* 213* 125* 42*  ALT 1,275* 731* 488* 348* 222*  ALKPHOS 150* 128* 198* 231* 177*  BILITOT 5.0* 4.5* 4.0* 2.3* 1.2  PROT 7.6 6.1* 6.5 5.9* 6.1*  ALBUMIN 4.2 3.1* 3.1* 2.6* 2.5*   Recent Labs  Lab 02/07/20 1701  LIPASE 22   Recent Labs   Lab 02/07/20 2300  AMMONIA 24   CBC: Recent Labs  Lab 02/07/20 1701 02/08/20 0424 02/09/20 0604 02/10/20 0548 02/11/20 0423  WBC 10.9* 8.9 7.6 7.1 6.6  HGB 15.9 13.2 12.6* 12.1* 11.4*  HCT 47.3 39.3 38.0* 36.1* 34.2*  MCV 82.4 82.6 82.6 82.8 83.6  PLT 212 141* 147* 168 179   Cardiac Enzymes: No results for input(s): CKTOTAL, CKMB, CKMBINDEX, TROPONINI in the last 168 hours. BNP: Invalid input(s): POCBNP CBG: Recent Labs  Lab 02/10/20 1615 02/10/20 1910 02/10/20 2305 02/11/20 0308 02/11/20 0715  GLUCAP 226* 249* 232* 270* 222*       IMAGING RESULTS:  Imaging: CT ABDOMEN PELVIS WO CONTRAST  Result Date: 02/09/2020 CLINICAL DATA:  Abdominal distension. Cholecystostomy tube placement today. EXAM: CT ABDOMEN AND PELVIS WITHOUT CONTRAST TECHNIQUE: Multidetector CT imaging of the abdomen and pelvis was performed following the standard protocol without IV contrast. COMPARISON:  CT abdomen pelvis dated February 07, 2020. FINDINGS: Lower chest: No acute abnormality. Subsegmental atelectasis in both lower lobes. Hepatobiliary: No focal liver abnormality. Interval placement of a transhepatic cholecystostomy tube within the gallbladder lumen. The gallbladder remains distended and contains  new high density material. No biliary dilatation. Pancreas: Unremarkable. No pancreatic ductal dilatation or surrounding inflammatory changes. Spleen: Normal in size without focal abnormality. Adrenals/Urinary Tract: The adrenal glands are unremarkable. 5.8 x 5.7 cm solid mass arising from the upper pole of the left kidney is grossly unchanged but better evaluated on prior contrast enhanced study. Unchanged 1.9 cm simple cyst in the lateral midpole of the left kidney. No renal calculi or hydronephrosis. The bladder is unremarkable. Stomach/Bowel: Stomach is within normal limits. Prior appendectomy. No evidence of bowel wall thickening, distention, or inflammatory changes. Vascular/Lymphatic: Aortic  atherosclerosis. No enlarged abdominal or pelvic lymph nodes. Reproductive: Prostate is unremarkable. Other: No free fluid or pneumoperitoneum. Musculoskeletal: No acute or significant osseous findings. IMPRESSION: 1. Interval placement of a transhepatic cholecystostomy tube within the gallbladder lumen. The gallbladder remains distended with some high density material, likely sludge and/or a small amount of post-procedure hematoma. No evident complication or new acute intra-abdominal process. 2. Unchanged 5.8 cm solid mass arising from the upper pole of the left kidney, better evaluated on prior contrast enhanced study, consistent with renal cell carcinoma. 3. Aortic Atherosclerosis (ICD10-I70.0). Electronically Signed   By: Titus Dubin M.D.   On: 02/09/2020 16:26   DG Chest 1 View  Result Date: 02/09/2020 CLINICAL DATA:  Intubated, recent cholecystostomy tube placement EXAM: CHEST  1 VIEW COMPARISON:  02/07/2020 FINDINGS: Single frontal view of the chest demonstrates endotracheal tube overlying tracheal air column, tip just below thoracic inlet. The cardiac silhouette is unremarkable. Patchy retrocardiac consolidation likely reflects atelectasis. No effusion or pneumothorax. IMPRESSION: 1. No complication after intubation. 2. Minimal left basilar atelectasis. Electronically Signed   By: Randa Ngo M.D.   On: 02/09/2020 18:38   DG Abd 1 View  Result Date: 02/09/2020 CLINICAL DATA:  OG tube placement EXAM: ABDOMEN - 1 VIEW COMPARISON:  CT dated February 07, 2020 FINDINGS: The OG tube projects over the gastric body. There is a percutaneous drain in the right upper quadrant likely representing the patient's cholecystostomy tube. The bowel gas pattern is nonobstructive. IMPRESSION: OG tube projects over the gastric body. Electronically Signed   By: Constance Holster M.D.   On: 02/09/2020 21:17   IR Perc Cholecystostomy  Result Date: 02/09/2020 INDICATION: 64 year old male with acute  cholecystitis, poor operative candidate. EXAM: Fluoroscopic and ultrasound-guided placement of cholecystostomy tube MEDICATIONS: The patient is receiving intravenous Zosyn as an inpatient. ANESTHESIA/SEDATION: Moderate (conscious) sedation was employed during this procedure. A total of Versed 0.5 mg and Fentanyl 25 mcg was administered intravenously. Moderate Sedation Time: 10 minutes. The patient's level of consciousness and vital signs were monitored continuously by radiology nursing throughout the procedure under my direct supervision. FLUOROSCOPY TIME:  Fluoroscopy Time: 0.7 minutes (14 mGy). COMPLICATIONS: None immediate. PROCEDURE: Informed written consent was obtained from the patient after a thorough discussion of the procedural risks, benefits and alternatives. All questions were addressed. Maximal Sterile Barrier Technique was utilized including caps, mask, sterile gowns, sterile gloves, sterile drape, hand hygiene and skin antiseptic. A timeout was performed prior to the initiation of the procedure. The patient was placed supine on the angiographic table. The patient's right upper quadrant was then prepped and draped in normal sterile fashion with maximum sterile barrier. Ultrasound demonstrates a distended gallbladder. Subdermal Local anesthesia was provided at the planned skin entry site. Under ultrasound guidance, deeper local anesthetic was provided through intercostal muscles and along the liver capsule. Ultrasound was used to puncture the gallbladder using a 21 gauge Chiba needle via  a transhepatic approach. A 0.018 inch wire was advanced into the lumen and a transition dilator placed. A gentle hand injection of contrast was performed. Cholecystogram demonstrates a distended gallbladder without definite evidence of gallstones. The cystic duct is obstructed. A 0.035 inch exchange wire was placed in the tract was dilated. A 10.2 French multipurpose drainage catheter was advanced into the gallbladder  lumen. The drain was then secured in place using a 0-silk suture and a Stayfix device. A sterile dressing was applied. The tube was placed to bag drainage. A culture was sent to the lab for analysis. The patient tolerated procedure well without evidence of immediate complication was transferred back to the floor in stable condition. IMPRESSION: Successful placement of a 10.2 French percutaneous, transhepatic cholecystostomy tube. PLAN: Return in 6 to 8 weeks for routine cholecystostomy tube check and change pending interim surgical intervention. Ruthann Cancer, MD [facility] Radiology Electronically Signed   By: Ruthann Cancer MD   On: 02/09/2020 15:34   _0 @ No results found.    ASSESSMENT AND PLAN    -Multidisciplinary rounds held today    Acutely comatose state    - likely due to septic encephalopathy     - discussed with wife at bedside      - patient will need to have airway secured and treat underlying cause     - CT head when more stable -correct electrolytes -empiric abx -lactate  -procalcitonin -neurology evaluation when sepsis treated -consider LP   Acute cholecystitis     -surgery on case - spoke with Thedore Mins - appreciate input  -s/p cholecystostomy tube - frank pus and some serosang active draining  - empiric Zosyn IV ICU monitoring    Left renal mass  - Urology on case - Dr Bernardo Heater - appreciate input  Acute Renal Failure-stage 2 most likely due to septic nephropathy  resolving -follow chem 7 -follow UO -continue Foley Catheter-assess need daily   Transaminitis In context of sepsis acute cholecysititis and shock physiology  - GI on case - appreciate input - workup in progress    - hepatitis panel negative  - intubated and sedated - minimal sedation to achieve a RASS goal: -1 Wake up assessment pending   Sepsis    -present on admission    - due to intraabdominal infection - acute cholecystitis -use  vasopressors to keep MAP>65 -follow ABG and LA -follow up cultures -emperic ABX -consider stress dose steroids   ID -continue IV abx as prescibed -follow up cultures  GI/Nutrition GI PROPHYLAXIS as indicated DIET-->TF's as tolerated Constipation protocol as indicated  ENDO - ICU hypoglycemic\Hyperglycemia protocol -check FSBS per protocol   ELECTROLYTES -follow labs as needed -replace as needed -pharmacy consultation   DVT/GI PRX ordered -SCDs  TRANSFUSIONS AS NEEDED MONITOR FSBS ASSESS the need for LABS as needed   Critical care provider statement:    Critical care time (minutes):  33   Critical care time was exclusive of:  Separately billable procedures and treating other patients   Critical care was necessary to treat or prevent imminent or life-threatening deterioration of the following conditions:  acute cholecystitis, acutely comatose, encephalopathy, sepsis, transaminits, multiple comorbid conditions   Critical care was time spent personally by me on the following activities:  Development of treatment plan with patient or surrogate, discussions with consultants, evaluation of patient's response to treatment, examination of patient, obtaining history from patient or surrogate, ordering and performing treatments and interventions, ordering and review of laboratory  studies and re-evaluation of patient's condition.  I assumed direction of critical care for this patient from another provider in my specialty: no    This document was prepared using Dragon voice recognition software and may include unintentional dictation errors.    Ottie Glazier, M.D.  Division of [place]

## 2020-02-11 NOTE — Progress Notes (Signed)
CC: Cholecystitis Subjective No fever last 24 hours.  White count is normal.  LFTs are going down Drain 140cc Hb stable CT scan personally reviewed showing cholecystostomy tube in place.  There might be some hematoma within the gallbladder lumen. Left renal cell mass  Objective: Vital signs in last 24 hours: Temp:  [98.1 F (36.7 C)-98.7 F (37.1 C)] 98.6 F (37 C) (12/24 0400) Pulse Rate:  [72-90] 85 (12/24 0800) Resp:  [19-20] 20 (12/24 0800) BP: (94-126)/(58-71) 126/71 (12/24 0800) SpO2:  [97 %-99 %] 98 % (12/24 0832) FiO2 (%):  [40 %] 40 % (12/24 0832) Last BM Date:  (PTA)  Intake/Output from previous day: 12/23 0701 - 12/24 0700 In: 1219 [I.V.:1067; IV Piggyback:152] Out: 6384 [Urine:1135; Drains:140] Intake/Output this shift: Total I/O In: -  Out: 375 [Urine:375]  Physical exam: NAD, sedated on the vent Abd: soft, nt, chole tube in place w sanguinous combined with bilious output.  Lab Results: CBC  Recent Labs    02/10/20 0548 02/11/20 0423  WBC 7.1 6.6  HGB 12.1* 11.4*  HCT 36.1* 34.2*  PLT 168 179   BMET Recent Labs    02/10/20 0548 02/11/20 0423  NA 138 139  K 4.3 3.6  CL 103 105  CO2 26 27  GLUCOSE 263* 262*  BUN 20 27*  CREATININE 1.21 1.02  CALCIUM 9.2 8.9   PT/INR No results for input(s): LABPROT, INR in the last 72 hours. ABG Recent Labs    02/09/20 1603 02/09/20 1755  PHART 7.33* 7.35  HCO3 23.7 23.7    Studies/Results: CT ABDOMEN PELVIS WO CONTRAST  Result Date: 02/09/2020 CLINICAL DATA:  Abdominal distension. Cholecystostomy tube placement today. EXAM: CT ABDOMEN AND PELVIS WITHOUT CONTRAST TECHNIQUE: Multidetector CT imaging of the abdomen and pelvis was performed following the standard protocol without IV contrast. COMPARISON:  CT abdomen pelvis dated February 07, 2020. FINDINGS: Lower chest: No acute abnormality. Subsegmental atelectasis in both lower lobes. Hepatobiliary: No focal liver abnormality. Interval placement of a  transhepatic cholecystostomy tube within the gallbladder lumen. The gallbladder remains distended and contains new high density material. No biliary dilatation. Pancreas: Unremarkable. No pancreatic ductal dilatation or surrounding inflammatory changes. Spleen: Normal in size without focal abnormality. Adrenals/Urinary Tract: The adrenal glands are unremarkable. 5.8 x 5.7 cm solid mass arising from the upper pole of the left kidney is grossly unchanged but better evaluated on prior contrast enhanced study. Unchanged 1.9 cm simple cyst in the lateral midpole of the left kidney. No renal calculi or hydronephrosis. The bladder is unremarkable. Stomach/Bowel: Stomach is within normal limits. Prior appendectomy. No evidence of bowel wall thickening, distention, or inflammatory changes. Vascular/Lymphatic: Aortic atherosclerosis. No enlarged abdominal or pelvic lymph nodes. Reproductive: Prostate is unremarkable. Other: No free fluid or pneumoperitoneum. Musculoskeletal: No acute or significant osseous findings. IMPRESSION: 1. Interval placement of a transhepatic cholecystostomy tube within the gallbladder lumen. The gallbladder remains distended with some high density material, likely sludge and/or a small amount of post-procedure hematoma. No evident complication or new acute intra-abdominal process. 2. Unchanged 5.8 cm solid mass arising from the upper pole of the left kidney, better evaluated on prior contrast enhanced study, consistent with renal cell carcinoma. 3. Aortic Atherosclerosis (ICD10-I70.0). Electronically Signed   By: Titus Dubin M.D.   On: 02/09/2020 16:26   DG Chest 1 View  Result Date: 02/09/2020 CLINICAL DATA:  Intubated, recent cholecystostomy tube placement EXAM: CHEST  1 VIEW COMPARISON:  02/07/2020 FINDINGS: Single frontal view of the chest demonstrates endotracheal  tube overlying tracheal air column, tip just below thoracic inlet. The cardiac silhouette is unremarkable. Patchy  retrocardiac consolidation likely reflects atelectasis. No effusion or pneumothorax. IMPRESSION: 1. No complication after intubation. 2. Minimal left basilar atelectasis. Electronically Signed   By: Randa Ngo M.D.   On: 02/09/2020 18:38   DG Abd 1 View  Result Date: 02/09/2020 CLINICAL DATA:  OG tube placement EXAM: ABDOMEN - 1 VIEW COMPARISON:  CT dated February 07, 2020 FINDINGS: The OG tube projects over the gastric body. There is a percutaneous drain in the right upper quadrant likely representing the patient's cholecystostomy tube. The bowel gas pattern is nonobstructive. IMPRESSION: OG tube projects over the gastric body. Electronically Signed   By: Constance Holster M.D.   On: 02/09/2020 21:17   IR Perc Cholecystostomy  Result Date: 02/09/2020 INDICATION: 64 year old male with acute cholecystitis, poor operative candidate. EXAM: Fluoroscopic and ultrasound-guided placement of cholecystostomy tube MEDICATIONS: The patient is receiving intravenous Zosyn as an inpatient. ANESTHESIA/SEDATION: Moderate (conscious) sedation was employed during this procedure. A total of Versed 0.5 mg and Fentanyl 25 mcg was administered intravenously. Moderate Sedation Time: 10 minutes. The patient's level of consciousness and vital signs were monitored continuously by radiology nursing throughout the procedure under my direct supervision. FLUOROSCOPY TIME:  Fluoroscopy Time: 0.7 minutes (14 mGy). COMPLICATIONS: None immediate. PROCEDURE: Informed written consent was obtained from the patient after a thorough discussion of the procedural risks, benefits and alternatives. All questions were addressed. Maximal Sterile Barrier Technique was utilized including caps, mask, sterile gowns, sterile gloves, sterile drape, hand hygiene and skin antiseptic. A timeout was performed prior to the initiation of the procedure. The patient was placed supine on the angiographic table. The patient's right upper quadrant was then  prepped and draped in normal sterile fashion with maximum sterile barrier. Ultrasound demonstrates a distended gallbladder. Subdermal Local anesthesia was provided at the planned skin entry site. Under ultrasound guidance, deeper local anesthetic was provided through intercostal muscles and along the liver capsule. Ultrasound was used to puncture the gallbladder using a 21 gauge Chiba needle via a transhepatic approach. A 0.018 inch wire was advanced into the lumen and a transition dilator placed. A gentle hand injection of contrast was performed. Cholecystogram demonstrates a distended gallbladder without definite evidence of gallstones. The cystic duct is obstructed. A 0.035 inch exchange wire was placed in the tract was dilated. A 10.2 French multipurpose drainage catheter was advanced into the gallbladder lumen. The drain was then secured in place using a 0-silk suture and a Stayfix device. A sterile dressing was applied. The tube was placed to bag drainage. A culture was sent to the lab for analysis. The patient tolerated procedure well without evidence of immediate complication was transferred back to the floor in stable condition. IMPRESSION: Successful placement of a 10.2 French percutaneous, transhepatic cholecystostomy tube. PLAN: Return in 6 to 8 weeks for routine cholecystostomy tube check and change pending interim surgical intervention. Ruthann Cancer, MD [facility] Radiology Electronically Signed   By: Ruthann Cancer MD   On: 02/09/2020 15:34    Anti-infectives: Anti-infectives (From admission, onward)   Start     Dose/Rate Route Frequency Ordered Stop   02/08/20 1800  cefTRIAXone (ROCEPHIN) 2 g in sodium chloride 0.9 % 100 mL IVPB  Status:  Discontinued        2 g 200 mL/hr over 30 Minutes Intravenous Every 24 hours 02/07/20 2230 02/07/20 2300   02/07/20 2330  piperacillin-tazobactam (ZOSYN) IVPB  3.375 g        3.375 g 12.5 mL/hr over 240  Minutes Intravenous Every 8 hours 02/07/20 2230     02/07/20 2230  metroNIDAZOLE (FLAGYL) IVPB 500 mg  Status:  Discontinued        500 mg 100 mL/hr over 60 Minutes Intravenous  Once 02/07/20 2223 02/07/20 2259   02/07/20 1930  cefTRIAXone (ROCEPHIN) 2 g in sodium chloride 0.9 % 100 mL IVPB        2 g 200 mL/hr over 30 Minutes Intravenous  Once 02/07/20 1918 02/07/20 2101      Assessment/Plan:  Cholecystitis status post cholecystostomy tube Sepsis is improving. No evidence of peritonitis. Bloody output from cholecystostomy tube but no evidence of active bleeding.  Globin has been stable for the last 48 hours Continue cholecystostomy tube and antibiotics. No need for emergent surgical intervention at this time. DisCussed my thought process with wife in detail. We will continue to follow We will benefit from elective cholecystectomy in 2 months or so after he recovers from this insult. Note that I spent more than 35 minutes in this encounter with greater than 50% spent in coordination and counseling of his care    Caroleen Hamman, MD, [facility]  02/11/2020

## 2020-02-12 DIAGNOSIS — B961 Klebsiella pneumoniae [K. pneumoniae] as the cause of diseases classified elsewhere: Secondary | ICD-10-CM

## 2020-02-12 LAB — CULTURE, BLOOD (ROUTINE X 2)
Culture: NO GROWTH
Culture: NO GROWTH
Special Requests: ADEQUATE
Special Requests: ADEQUATE

## 2020-02-12 LAB — RENAL FUNCTION PANEL
Albumin: 2.3 g/dL — ABNORMAL LOW (ref 3.5–5.0)
Anion gap: 11 (ref 5–15)
BUN: 25 mg/dL — ABNORMAL HIGH (ref 8–23)
CO2: 25 mmol/L (ref 22–32)
Calcium: 9.3 mg/dL (ref 8.9–10.3)
Chloride: 104 mmol/L (ref 98–111)
Creatinine, Ser: 0.88 mg/dL (ref 0.61–1.24)
GFR, Estimated: >60 mL/min (ref >60)
Glucose, Bld: 261 mg/dL — ABNORMAL HIGH (ref 70–99)
Phosphorus: 3.3 mg/dL (ref 2.5–4.6)
Potassium: 3.6 mmol/L (ref 3.5–5.1)
Sodium: 140 mmol/L (ref 135–145)

## 2020-02-12 LAB — CBC WITH DIFFERENTIAL/PLATELET
Abs Immature Granulocytes: 0.29 10*3/uL — ABNORMAL HIGH (ref 0.00–0.07)
Basophils Absolute: 0.1 10*3/uL (ref 0.0–0.1)
Basophils Relative: 1 %
Eosinophils Absolute: 0.1 10*3/uL (ref 0.0–0.5)
Eosinophils Relative: 2 %
HCT: 35.7 % — ABNORMAL LOW (ref 39.0–52.0)
Hemoglobin: 11.7 g/dL — ABNORMAL LOW (ref 13.0–17.0)
Immature Granulocytes: 4 %
Lymphocytes Relative: 26 %
Lymphs Abs: 1.8 10*3/uL (ref 0.7–4.0)
MCH: 27.4 pg (ref 26.0–34.0)
MCHC: 32.8 g/dL (ref 30.0–36.0)
MCV: 83.6 fL (ref 80.0–100.0)
Monocytes Absolute: 0.5 10*3/uL (ref 0.1–1.0)
Monocytes Relative: 8 %
Neutro Abs: 4.1 10*3/uL (ref 1.7–7.7)
Neutrophils Relative %: 59 %
Platelets: 221 10*3/uL (ref 150–400)
RBC: 4.27 MIL/uL (ref 4.22–5.81)
RDW: 15.4 % (ref 11.5–15.5)
WBC: 7 10*3/uL (ref 4.0–10.5)
nRBC: 0 % (ref 0.0–0.2)

## 2020-02-12 LAB — TRIGLYCERIDES: Triglycerides: 626 mg/dL — ABNORMAL HIGH (ref <150)

## 2020-02-12 LAB — MAGNESIUM: Magnesium: 1.6 mg/dL — ABNORMAL LOW (ref 1.7–2.4)

## 2020-02-12 MED ORDER — PROPOFOL 1000 MG/100ML IV EMUL
INTRAVENOUS | Status: AC
  Administered 2020-02-12: 11:00:00 20 ug/kg/min via INTRAVENOUS
  Filled 2020-02-12: qty 100

## 2020-02-12 MED ORDER — DEXMEDETOMIDINE HCL IN NACL 400 MCG/100ML IV SOLN
0.4000 ug/kg/h | INTRAVENOUS | Status: DC
  Administered 2020-02-12: 11:00:00 0.4 ug/kg/h via INTRAVENOUS
  Administered 2020-02-12: 22:00:00 1 ug/kg/h via INTRAVENOUS
  Administered 2020-02-12: 17:00:00 0.7 ug/kg/h via INTRAVENOUS
  Administered 2020-02-13: 01:00:00 1 ug/kg/h via INTRAVENOUS
  Administered 2020-02-13: 10:00:00 0.8 ug/kg/h via INTRAVENOUS
  Administered 2020-02-13: 06:00:00 1 ug/kg/h via INTRAVENOUS
  Filled 2020-02-12 (×6): qty 100

## 2020-02-12 MED ORDER — INSULIN ASPART 100 UNIT/ML ~~LOC~~ SOLN
0.0000 [IU] | SUBCUTANEOUS | Status: DC
  Administered 2020-02-13 (×2): 15 [IU] via SUBCUTANEOUS
  Administered 2020-02-13: 20:00:00 7 [IU] via SUBCUTANEOUS
  Administered 2020-02-13: 04:00:00 11 [IU] via SUBCUTANEOUS
  Administered 2020-02-13: 4 [IU] via SUBCUTANEOUS
  Administered 2020-02-14 (×2): 7 [IU] via SUBCUTANEOUS
  Administered 2020-02-14: 04:00:00 4 [IU] via SUBCUTANEOUS
  Administered 2020-02-14: 08:00:00 7 [IU] via SUBCUTANEOUS
  Administered 2020-02-14: 16:00:00 11 [IU] via SUBCUTANEOUS
  Administered 2020-02-14: 20:00:00 4 [IU] via SUBCUTANEOUS
  Administered 2020-02-15 (×2): 7 [IU] via SUBCUTANEOUS
  Filled 2020-02-12 (×13): qty 1

## 2020-02-12 NOTE — Progress Notes (Signed)
[address] visited pt. and wife while rounding on ICU; wife shared pt. was brought to hospital for gall bladder infection and has become septic --> pt. intubated and sedated.  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  CH offered prayer for pt.'s healing and recovery and for strength for family.  CH remains available as needed.

## 2020-02-12 NOTE — Progress Notes (Signed)
[facility] SURGICAL PROGRESS NOTE (cpt [phone])  Hospital Day(s): 5.   Interval History:  (12/22) underwent percutaneous cholecystostomy tube placement with IR which revealed purulent aspirate from the gallbladder.   Patient seen and examined this morning Remains intubated, sedated, reportedly anticipating possible extubation later today. No fevers He remains without leukocytosis, WBC 7K  Cx from cholecystostomy are growing gram negative rods: KLEBSIELLA PNEUMONIAE sensitive to Zosyn. Cholecystostomy tube output in last 24 hours recorded at 185 ccs; serosanguineous. Continues on Zosyn  Review of Systems:  Unable to reliably preform secondary to intubation   Vital signs in last 24 hours: [min-max] current  Temp:  [98.3 F (36.8 C)-99.32 F (37.4 C)] 99.32 F (37.4 C) (12/25 0900) Pulse Rate:  [75-105] 96 (12/25 0900) Resp:  [20-24] 20 (12/25 0900) BP: (115-139)/(64-80) 136/70 (12/25 0900) SpO2:  [94 %-98 %] 94 % (12/25 0900) FiO2 (%):  [30 %-40 %] 30 % (12/25 0710)     Height: 5' 9"  (175.3 cm) Weight: 102.1 kg BMI (Calculated): 33.21   Intake/Output last 2 shifts:  12/24 0701 - 12/25 0700 In: 2016.6 [I.V.:1146.6; NG/GT:735.3; IV Piggyback:134.6] Out: 2035 [Urine:1850; Drains:185]   Physical Exam:  Constitutional: sedated, intubated  HENT: normocephalic without obvious abnormality  Respiratory: on ventilator Cardiovascular: regular rate and sinus rhythm  Gastrointestinal: Soft, protuberant at baseline, unable to assess tenderness, cholecystostomy tube in the RUQ, output serosanguineous appearing more dilute in bag. Musculoskeletal: no edema or wounds, motor and sensation grossly intact, NT    Labs:  CBC Latest Ref Rng & Units 02/12/2020 02/11/2020 02/10/2020  WBC 4.0 - 10.5 K/uL 7.0 6.6 7.1  Hemoglobin 13.0 - 17.0 g/dL 11.7(L) 11.4(L) 12.1(L)  Hematocrit 39.0 - 52.0 % 35.7(L) 34.2(L) 36.1(L)  Platelets 150 - 400 K/uL 221 179 168   CMP Latest Ref Rng  & Units 02/12/2020 02/11/2020 02/10/2020  Glucose 70 - 99 mg/dL 261(H) 262(H) 263(H)  BUN 8 - 23 mg/dL 25(H) 27(H) 20  Creatinine 0.61 - 1.24 mg/dL 0.88 1.02 1.21  Sodium 135 - 145 mmol/L 140 139 138  Potassium 3.5 - 5.1 mmol/L 3.6 3.6 4.3  Chloride 98 - 111 mmol/L 104 105 103  CO2 22 - 32 mmol/L 25 27 26   Calcium 8.9 - 10.3 mg/dL 9.3 8.9 9.2  Total Protein 6.5 - 8.1 g/dL - 6.1(L) 5.9(L)  Total Bilirubin 0.3 - 1.2 mg/dL - 1.2 2.3(H)  Alkaline Phos 38 - 126 U/L - 177(H) 231(H)  AST 15 - 41 U/L - 42(H) 125(H)  ALT 0 - 44 U/L - 222(H) 348(H)     Imaging studies: No new pertinent imaging studies   Assessment/Plan: (ICD-10's: K81.0) 64 y.o. male with improving/resolving septicemia, currently weaning from ventilator support, secondary to cholecystitis s/p percutaneous cholecystostomy tube placement (62/70), complicated by pertinent comorbidities including significant cardiovascular disease, CAD s/p PCI   - Greatly appreciate PCCM assistance with this patient; on ventilator support; wean per protocol    -His abdomen remains benign and his drain functioning well, LFTs all improving.  - Continue IV Abx (Zosyn);  Cx positive for Klebsiella pneumonia sensitive to Zosyn.  - Maintain cholecystostomy tube; monitor output   - IV              - Monitor abdominal examination             - Pain control prn; antiemetics prn    - Further management per primary service; we will follow    All of the above findings and recommendations were discussed  with the medical team.  D/w wife at bedside.   Ronny Bacon, M.D., [facility]  02/12/2020 ; 9:31 AM

## 2020-02-12 NOTE — Progress Notes (Signed)
   02/09/20 1635  Assess: MEWS Score  Temp (!) 103.1 F (39.5 C)  BP (!) 172/75  Pulse Rate (!) 122  Resp (!) 42  Level of Consciousness Unresponsive  SpO2 100 %  O2 Device Non-rebreather Mask  O2 Flow Rate (L/min) 10 L/min  Assess: MEWS Score  MEWS Temp 2  MEWS Systolic 0  MEWS Pulse 2  MEWS RR 3  MEWS LOC 3  MEWS Score 10  MEWS Score Color Red  Assess: if the MEWS score is Yellow or Red  Were vital signs taken at a resting state? Yes  Focused Assessment Change from prior assessment (see assessment flowsheet)  Early Detection of Sepsis Score *See Row Information* Low  MEWS guidelines implemented *See Row Information* Yes  Treat  MEWS Interventions Escalated (See documentation below)  Take Vital Signs  Increase Vital Sign Frequency  Red: Q 1hr X 4 then Q 4hr X 4, if remains red, continue Q 4hrs  Escalate  MEWS: Escalate Red: discuss with charge nurse/RN and provider, consider discussing with RRT  Notify: Charge Nurse/RN  Name of Charge Nurse/RN Notified Herby Abraham, RN  Date Charge Nurse/RN Notified 02/09/20  Time Charge Nurse/RN Notified 1630  Notify: Provider  Provider Name/Title Dwyane Dee  Date Provider Notified 02/09/20  Time Provider Notified 1630  Notification Type Face-to-face  Notification Reason Change in status  Response See new orders  Date of Provider Response 02/09/20  Time of Provider Response 1630  Document  Patient Outcome Transferred/level of care increased  Progress note created (see row info) Yes

## 2020-02-12 NOTE — Progress Notes (Signed)
CRITICAL CARE NOTE    Name: Kenneth Osborn MRN: [id number] DOB: 1955-10-13     LOS: 5     SUBJECTIVE FINDINGS & SIGNIFICANT EVENTS    Patient description:   64 yo male with PMH of coronary artery disease status post stent angioplasty, COPD, chronic low back pain, GERD, hypertension, diabetes mellitus with complications of stage III kidney disease and Mnire's disease who presents to the emergency room for evaluation of chest pain.  He reported in ED that pain is mostly across his chest with radiation to the right arm associated with fever, chills and fatigue.  He complained of nausea but denies having any abdominal pain.  Patient felt that he may have infection with coronavirus but denies having any known exposure.  He denied having any diaphoresis, no palpitations, no shortness of breath but he has felt dizzy and lightheaded.  He denied having any diarrhea, no constipation, no melena stools, no hematochezia, no urinary frequency, no nocturia, no cough, no headache.  He has chronic low back pain but has had multiple back surgeries and stated that there has not been a change in the intensity of his pain.  In the ER he was noted to have a fever with a T-max of 101.4 F, he was tachycardic and tachypneic as well.  Labs show sodium 133, calcium 9.9, chloride 99, bicarbonate 20, glucose 240, BUN 35, creatinine 1.84, calcium 10, anion gap 15, alkaline phosphatase 115, albumin 4.2, lipase 22, AST 901, ALT 1275, total protein 7.6, bilirubin 3.6 troponin 8, lactic acid 3.1, white count 10.9, hemoglobin 15.9 hematocrit 47.3, MCV 82, RDW 13.8, platelet count 212, PT 11.9, INR 0.9  Respiratory viral panel is negative  Gallbladder ultrasound shows gallbladder sludge with findings that may represent sequelae of acalculus cholecystitis CT  scan of abdomen and pelvis showed mild haziness of the gallbladder wall and pericholecystic fat. No calcified gallstones. Ultrasound may provide better evaluation of the gallbladder if there is a concern for acute cholecystitis. 5.6 x 5.9 heterogeneously enhancing mass in the upper pole of the left kidney most concerning for malignancy.  CT angiogram of the chest is negative for pulmonary embolism  Chest x-ray reviewed by me shows clear lungs  Twelve-lead EKG reviewed by me shows sinus tachycardia with a left posterior fascicular block  Patient is a 64 year old patient male who presents to the emergency room for evaluation of chest pain was noted to be febrile, tachycardic and tachypneic with an elevated lactic acid level. White count is slightly elevated at 10.9 and source of sepsis appears to be secondary to acute acalculous cholecystitis.  He will be admitted to the hospital for further evaluation.    Patient was in PACU for cholecystostomy tube placement but continued to deteriorate and required increased O2 as well as decreasing mentation.  During my evaluation patients wife was present and was able to give additional details.  Patient is unresponsive with severe acute hypoxemic respiratory failure and in circulatory shock.   PCCM consultation for likely septic shock with multiorgan failure in acutely comatose state.     02/10/20- patient on MV RASS-1. Awakening trial following verbal communication. RUQ drain with less blood more serosang.  Consulted urology for L renal mass - spoke with Dr Bernardo Heater.  Patient seen by surgery today - spoke with Dr Christian Mate.    02/11/20- patient is on 40% FiO2. Spoke with wife and reviewed care plan and answered questions.   02/12/20-  Patient without overnight events.  Plan for SBT.  reviewed with wife at bedside.     Lines/tubes : Biliary Tube RUQ (Active)    Microbiology/Sepsis markers: Results for orders placed or performed during the hospital encounter of  02/07/20  Resp Panel by RT-PCR (Flu A&B, Covid) Nasopharyngeal Swab     Status: None   Collection Time: 02/07/20  5:46 PM   Specimen: Nasopharyngeal Swab; Nasopharyngeal(NP) swabs in vial transport medium  Result Value Ref Range Status   SARS Coronavirus 2 by RT PCR NEGATIVE NEGATIVE Final    Comment: (NOTE) SARS-CoV-2 target nucleic acids are NOT DETECTED.  The SARS-CoV-2 RNA is generally detectable in upper respiratory specimens during the acute phase of infection. The lowest concentration of SARS-CoV-2 viral copies this assay can detect is 138 copies/mL. A negative result does not preclude SARS-Cov-2 infection and should not be used as the sole basis for treatment or other patient management decisions. A negative result may occur with  improper specimen collection/handling, submission of specimen other than nasopharyngeal swab, presence of viral mutation(s) within the areas targeted by this assay, and inadequate number of viral copies(<138 copies/mL). A negative result must be combined with clinical observations, patient history, and epidemiological information. The expected result is Negative.  Fact Sheet for Patients:  EntrepreneurPulse.com.au  Fact Sheet for Healthcare Providers:  IncredibleEmployment.be  This test is no t yet approved or cleared by the [place] FDA and  has been authorized for detection and/or diagnosis of SARS-CoV-2 by FDA under an Emergency Use Authorization (EUA). This EUA will remain  in effect (meaning this test can be used) for the duration of the COVID-19 declaration under Section 564(b)(1) of the Act, 21 U.S.C.section 360bbb-3(b)(1), unless the authorization is terminated  or revoked sooner.       Influenza A by PCR NEGATIVE NEGATIVE Final   Influenza B by PCR NEGATIVE NEGATIVE Final    Comment: (NOTE) The Xpert Xpress SARS-CoV-2/FLU/RSV plus assay is intended as an aid in the diagnosis of influenza from  Nasopharyngeal swab specimens and should not be used as a sole basis for treatment. Nasal washings and aspirates are unacceptable for Xpert Xpress SARS-CoV-2/FLU/RSV testing.  Fact Sheet for Patients: EntrepreneurPulse.com.au  Fact Sheet for Healthcare Providers: IncredibleEmployment.be  This test is not yet approved or cleared by the [place] FDA and has been authorized for detection and/or diagnosis of SARS-CoV-2 by FDA under an Emergency Use Authorization (EUA). This EUA will remain in effect (meaning this test can be used) for the duration of the COVID-19 declaration under Section 564(b)(1) of the Act, 21 U.S.C. section 360bbb-3(b)(1), unless the authorization is terminated or revoked.  Performed at [facility], [address] [place], [place] [address]   Culture, blood (Routine X 2) w Reflex to ID Panel     Status: None   Collection Time: 02/07/20  5:46 PM   Specimen: BLOOD  Result Value Ref Range Status   Specimen Description BLOOD BLOOD RIGHT FOREARM  Final   Special Requests   Final    BOTTLES DRAWN AEROBIC AND ANAEROBIC Blood Culture adequate volume   Culture   Final    NO GROWTH 5 DAYS Performed at [facility], [address] [place], [place] [address]    Report Status 02/12/2020 FINAL  Final  Culture, blood (Routine X 2) w Reflex to ID Panel     Status: None   Collection Time: 02/07/20  5:46 PM   Specimen: BLOOD  Result Value Ref Range Status   Specimen Description BLOOD LEFT ANTECUBITAL  Final  Special Requests   Final    BOTTLES DRAWN AEROBIC AND ANAEROBIC Blood Culture adequate volume   Culture   Final    NO GROWTH 5 DAYS Performed at [facility], [address] [place], [place] [address]    Report Status 02/12/2020 FINAL  Final  Urine Culture     Status: Abnormal   Collection Time: 02/07/20  5:46 PM   Specimen: Urine, Random  Result Value Ref Range Status   Specimen  Description   Final    URINE, RANDOM Performed at [facility], [address] [place], [place] [address]    Special Requests   Final    NONE Performed at [facility], [address], [place], [place] [address]    Culture (A)  Final    <10,000 COLONIES/mL INSIGNIFICANT GROWTH Performed at [facility], [place] [address], [place], [place] [address]    Report Status 02/08/2020 FINAL  Final  Aerobic/Anaerobic Culture (surgical/deep wound)     Status: None (Preliminary result)   Collection Time: 02/09/20  3:00 PM   Specimen: Gallbladder; Abscess  Result Value Ref Range Status   Specimen Description   Final    GALL BLADDER Performed at [facility], [address], [place], [place] [address]    Special Requests   Final    NONE Performed at [facility], [address] [place], [place] [address]    Gram Stain   Final    ABUNDANT WBC PRESENT, PREDOMINANTLY PMN MODERATE GRAM NEGATIVE RODS Performed at [facility], [place] [address], [place], [place] [address]    Culture   Final    FEW KLEBSIELLA PNEUMONIAE NO ANAEROBES ISOLATED; CULTURE IN PROGRESS FOR 5 DAYS    Report Status PENDING  Incomplete   Organism ID, Bacteria KLEBSIELLA PNEUMONIAE  Final      Susceptibility   Klebsiella pneumoniae - MIC*    AMPICILLIN >=32 RESISTANT Resistant     CEFAZOLIN <=4 SENSITIVE Sensitive     CEFEPIME <=0.12 SENSITIVE Sensitive     CEFTAZIDIME <=1 SENSITIVE Sensitive     CEFTRIAXONE <=0.25 SENSITIVE Sensitive     CIPROFLOXACIN <=0.25 SENSITIVE Sensitive     GENTAMICIN <=1 SENSITIVE Sensitive     IMIPENEM <=0.25 SENSITIVE Sensitive     TRIMETH/SULFA <=20 SENSITIVE Sensitive     AMPICILLIN/SULBACTAM 4 SENSITIVE Sensitive     PIP/TAZO <=4 SENSITIVE Sensitive     * FEW KLEBSIELLA PNEUMONIAE  MRSA PCR Screening     Status: None   Collection Time: 02/10/20 12:00 AM   Specimen: Nasal Mucosa; Nasopharyngeal  Result Value Ref Range Status   MRSA  by PCR NEGATIVE NEGATIVE Final    Comment:        The GeneXpert MRSA Assay (FDA approved for NASAL specimens only), is one component of a comprehensive MRSA colonization surveillance program. It is not intended to diagnose MRSA infection nor to guide or monitor treatment for MRSA infections. Performed at [facility], [address] [place],  [address]     Anti-infectives:  Anti-infectives (From admission, onward)   Start     Dose/Rate Route Frequency Ordered Stop   02/08/20 1800  cefTRIAXone (ROCEPHIN) 2 g in sodium chloride 0.9 % 100 mL IVPB  Status:  Discontinued        2 g 200 mL/hr over 30 Minutes Intravenous Every 24 hours 02/07/20 2230 02/07/20 2300   02/07/20 2330  piperacillin-tazobactam (ZOSYN) IVPB 3.375 g  3.375 g 12.5 mL/hr over 240 Minutes Intravenous Every 8 hours 02/07/20 2230     02/07/20 2230  metroNIDAZOLE (FLAGYL) IVPB 500 mg  Status:  Discontinued        500 mg 100 mL/hr over 60 Minutes Intravenous  Once 02/07/20 2223 02/07/20 2259   02/07/20 1930  cefTRIAXone (ROCEPHIN) 2 g in sodium chloride 0.9 % 100 mL IVPB        2 g 200 mL/hr over 30 Minutes Intravenous  Once 02/07/20 1918 02/07/20 2101       Consults: Treatment Team:  Ronny Bacon, MD Abbie Sons, MD     PAST MEDICAL HISTORY   Past Medical History:  Diagnosis Date  . Acute postoperative pain 12/03/2018  . Allergic rhinitis 12/30/2012  . Anginal pain ([place])   . Bronchitis    hx of  . Can't get food down 08/12/2014  . Chronic back pain    thoracic area  . Concussion 09/2015  . COPD (chronic obstructive pulmonary disease) ([place])   . Coronary artery disease    99% blockage  . DDD (degenerative disc disease), cervical   . Dehydration symptoms    2019  . 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 Dysphagia   . GERD (gastroesophageal reflux disease)   . History of Meniere's disease 12/21/2014  . History of thoracic spine  surgery (S/P T9-10 IVD spacer) 12/21/2014  . Hypercholesteremia   . Hyperlipidemia   . Hypertension    sees Dr. Jenny Reichmann walker Jefm Bryant  . 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 Myocardial infarction [facility])    Se[place] Dr. Drema [place] [facility]  . Neuromuscular disorder ([place])    diabetic neuropathy in feet  . Psychosis ([place])   . Short-segment Barrett's esophagus   . Sleep apnea    uses cpap and 2 L o2 at hs     SURGICAL HISTORY   Past Surgical History:  Procedure Laterality Date  . APPENDECTOMY    . ARTHRODESIS ANTERIOR ANTERIOR CERVICLE SPINE  01/04/2013  . BACK SURGERY     fusion thoracic area  . CARDIAC CATHETERIZATION     may 2012 and Nov 20, 2010  . CARDIAC CATHETERIZATION N/A 06/15/2015   Procedure: Left Heart Cath and Coronary Angiography;  Surgeon: Corey Skains, MD;  Location: [place] CV LAB;  Service: Cardiovascular;  Laterality: N/A;  . CARDIAC CATHETERIZATION N/A 06/15/2015   Procedure: Coronary Stent Intervention;  Surgeon: Isaias Cowman, MD;  Location: [place] CV LAB;  Service: Cardiovascular;  Laterality: N/A;  . CARDIOVASCULAR STRESS TEST     jan 2014  . COLONOSCOPY WITH PROPOFOL N/A 09/19/2014   Procedure: COLONOSCOPY WITH PROPOFOL;  Surgeon: Manya Silvas, MD;  Location: [facility] ENDOSCOPY;  Service: Endoscopy;  Laterality: N/A;  . CORONARY ANGIOPLASTY     stent placement  . ESOPHAGOGASTRODUODENOSCOPY N/A 09/19/2014   Procedure: ESOPHAGOGASTRODUODENOSCOPY (EGD);  Surgeon: Manya Silvas, MD;  Location: [facility] ENDOSCOPY;  Service: Endoscopy;  Laterality: N/A;  . IR PERC CHOLECYSTOSTOMY  02/09/2020  . [place] right ear  . LUMBAR SPINAL CORD SIMULATOR LEAD REMOVAL Right 08/09/2019   Procedure: REMOVAL SPINAL CORD STIMULATOR PERCUTANEOUS LEADS, REMOVAL PULSE GENERATOR;  Surgeon: [name], MD;  Location: [facility] ORS;  Service: Neurosurgery;  Laterality: Right;  LOCAL WITH MAC  . mastoid shunt     left, 2002, 1997  right ear, 1980 right ear  . PULSE GENERATOR IMPLANT N/A  01/18/2019   Procedure: MEDTRONIC SPINAL CORD STIMULATOR BATTERY EXCHANGE;  Surgeon: [name], MD;  Location: [facility] ORS;  Service: Neurosurgery;  Laterality: N/A;  . SAVORY DILATION N/A 09/19/2014   Procedure: SAVORY DILATION;  Surgeon: Manya Silvas, MD;  Location: [facility] ENDOSCOPY;  Service: Endoscopy;  Laterality: N/A;  . SHOULDER ARTHROSCOPY WITH SUBACROMIAL DECOMPRESSION Left 04/06/2012   Procedure: SHOULDER ARTHROSCOPY WITH SUBACROMIAL DECOMPRESSION;  Surgeon: Vickey Huger, MD;  Location: [place];  Service: Orthopedics;  Laterality: Left;  left shoulder arthroscopy, subacromial decompression and distal clavicle resection  . SPINAL CORD STIMULATOR IMPLANT Right      FAMILY HISTORY   Family History  Problem Relation Age of Onset  . Heart disease Mother   . Diabetes Mother   . Heart disease Father   . Cancer Sister   . Heart disease Maternal Aunt   . Heart disease Maternal Uncle   . Diabetes Maternal Grandmother   . Diabetes Paternal Grandmother      SOCIAL HISTORY   Social History   Tobacco Use  . Smoking status: Never Smoker  . Smokeless tobacco: Never Used  Vaping Use  . Vaping Use: Never used  Substance Use Topics  . Alcohol use: No  . Drug use: No     MEDICATIONS   Current Medication:  Current Facility-Administered Medications:  .  0.9 %  sodium chloride infusion, , Intravenous, PRN, Shawna Clamp, MD, Stopped at 02/10/20 [phone] .  acetaminophen (TYLENOL) 160 MG/5ML solution 1,000 mg, 1,000 mg, Per Tube, TID PRN, Shawna Clamp, MD .  chlorhexidine gluconate (MEDLINE KIT) (PERIDEX) 0.12 % solution 15 mL, 15 mL, Mouth Rinse, BID, Blakeney, Dana G, NP, 15 mL at 02/12/20 0800 .  Chlorhexidine Gluconate Cloth 2 % PADS 6 each, 6 each, Topical, Daily, Awilda Bill, NP, 6 each at 02/11/20 2215 .  dexmedetomidine (PRECEDEX) 400 MCG/100ML (4 mcg/mL) infusion, 0.4-1.2 mcg/kg/hr, Intravenous, Titrated, Zeola Brys,  Ivianna Notch, MD, Last Rate: 10.21 mL/hr at 02/12/20 1127, 0.4 mcg/kg/hr at 02/12/20 1127 .  enoxaparin (LOVENOX) injection 50 mg, 0.5 mg/kg, Subcutaneous, Q24H, Enzo Bi, MD, 50 mg at 02/11/20 2139 .  feeding supplement (PROSource TF) liquid 90 mL, 90 mL, Per Tube, TID, Kesha Hurrell, MD, 90 mL at 02/12/20 1139 .  feeding supplement (VITAL HIGH PROTEIN) liquid 1,000 mL, 1,000 mL, Per Tube, Q24H, Krystina Strieter, MD, 1,000 mL at 02/11/20 1310 .  fentaNYL 2553mg in NS 2543m(1027mml) infusion-PREMIX, 0-400 mcg/hr, Intravenous, Continuous, Lannette Avellino, MD, Last Rate: 20 mL/hr at 02/12/20 0800, 200 mcg/hr at 02/12/20 0800 .  insulin aspart (novoLOG) injection 0-15 Units, 0-15 Units, Subcutaneous, Q4H, BlaAwilda BillP, 5 Units at 02/12/20 0335 .  insulin glargine (LANTUS) injection 10 Units, 10 Units, Subcutaneous, BID, AleOttie GlazierD, 10 Units at 02/12/20 1133 .  MEDLINE mouth rinse, 15 mL, Mouth Rinse, 10 times per day, BlaAwilda BillP, 15 mL at 02/12/20 1201 .  nitroGLYCERIN (NITROSTAT) SL tablet 0.4 mg, 0.4 mg, Sublingual, Q5 min PRN, Agbata, Tochukwu, MD .  ondansetron (ZOFRAN) tablet 4 mg, 4 mg, Per Tube, Q6H PRN **OR** ondansetron (ZOFRAN) injection 4 mg, 4 mg, Intravenous, Q6H PRN, KumShawna ClampD .  pantoprazole sodium (PROTONIX) 40 mg/20 mL oral suspension 40 mg, 40 mg, Per Tube, BID, AleLanney Ginsuad, MD, 40 mg at 02/12/20 1140 .  piperacillin-tazobactam (ZOSYN) IVPB 3.375 g, 3.375 g, Intravenous, Q8H, Agbata, Tochukwu, MD, Last Rate: 12.5 mL/hr at 02/12/20 1115, 3.375 g at 02/12/20 1115    ALLERGIES   Patient  has no known allergies.    REVIEW OF SYSTEMS     Unable to obtain patient is unresponsive  PHYSICAL EXAMINATION   Vital Signs: Temp:  [98.3 F (36.8 C)-99.5 F (37.5 C)] 99.32 F (37.4 C) (12/25 1200) Pulse Rate:  [75-105] 91 (12/25 1100) Resp:  [20-24] 20 (12/25 1200) BP: (115-140)/(64-80) 129/65 (12/25 1200) SpO2:  [93 %-98 %] 96 % (12/25  1100) FiO2 (%):  [30 %-40 %] 30 % (12/25 0710)  GENERAL:age appropriate HEAD: Normocephalic, atraumatic.  EYES: Pupils equal, round, reactive to light.  No scleral icterus.  MOUTH: Moist mucosal membrane. NECK: Supple. No thyromegaly. No nodules. No JVD.  PULMONARY: decreased air entry bilateraly  CARDIOVASCULAR: S1 and S2. Regular rate and rhythm. No murmurs, rubs, or gallops.  GASTROINTESTINAL: Soft, nontender, non-distended. No masses. Positive bowel sounds. No hepatosplenomegaly.  MUSCULOSKELETAL: No swelling, clubbing, or edema.  NEUROLOGIC: encephalopathic GCS7 SKIN:intact,warm,dry   PERTINENT DATA     Infusions: . sodium chloride Stopped (02/10/20 0904)  . dexmedetomidine (PRECEDEX) IV infusion 0.4 mcg/kg/hr (02/12/20 1127)  . fentaNYL infusion INTRAVENOUS 200 mcg/hr (02/12/20 0800)  . piperacillin-tazobactam (ZOSYN)  IV 3.375 g (02/12/20 1115)   Scheduled Medications: . chlorhexidine gluconate (MEDLINE KIT)  15 mL Mouth Rinse BID  . Chlorhexidine Gluconate Cloth  6 each Topical Daily  . enoxaparin (LOVENOX) injection  0.5 mg/kg Subcutaneous Q24H  . feeding supplement (PROSource TF)  90 mL Per Tube TID  . feeding supplement (VITAL HIGH PROTEIN)  1,000 mL Per Tube Q24H  . insulin aspart  0-15 Units Subcutaneous Q4H  . insulin glargine  10 Units Subcutaneous BID  . mouth rinse  15 mL Mouth Rinse 10 times per day  . pantoprazole sodium  40 mg Per Tube BID   PRN Medications: sodium chloride, acetaminophen (TYLENOL) oral liquid 160 mg/5 mL, nitroGLYCERIN, ondansetron **OR** ondansetron (ZOFRAN) IV Hemodynamic parameters:   Intake/Output: 12/24 0701 - 12/25 0700 In: 2016.6 [I.V.:1146.6; NG/GT:735.3; IV Piggyback:134.6] Out: 2035 [Urine:1850; Drains:185]  Ventilator  Settings: Vent Mode: PRVC FiO2 (%):  [30 %-40 %] 30 % Set Rate:  [20 bmp] 20 bmp Vt Set:  [500 mL] 500 mL PEEP:  [5 cmH20] 5 cmH20 Plateau Pressure:  [17 cmH20] 17 cmH20    LAB RESULTS:  Basic  Metabolic Panel: Recent Labs  Lab 02/08/20 0424 02/09/20 0604 02/10/20 0548 02/11/20 0423 02/12/20 0529  NA 136 137 138 139 140  K 4.5 4.5 4.3 3.6 3.6  CL 103 104 103 105 104  CO2 24 21* _0 GLUCOSE 252* 328* 263* 262* 261*  BUN 26* 21 20 27* 25*  CREATININE 1.40* 1.22 1.21 1.02 0.88  CALCIUM 9.4 9.5 9.2 8.9 9.3  MG  --  1.6* 1.9 1.9 1.6*  PHOS  --   --   --   --  3.3   Liver Function Tests: Recent Labs  Lab 02/07/20 1701 02/08/20 0424 02/09/20 0604 02/10/20 0548 02/11/20 0423 02/12/20 0529  AST 901* 359* 213* 125* 42*  --   ALT 1,275* 731* 488* 348* 222*  --   ALKPHOS 150* 128* 198* 231* 177*  --   BILITOT 5.0* 4.5* 4.0* 2.3* 1.2  --   PROT 7.6 6.1* 6.5 5.9* 6.1*  --   ALBUMIN 4.2 3.1* 3.1* 2.6* 2.5* 2.3*   Recent Labs  Lab 02/07/20 1701  LIPASE 22   Recent Labs  Lab 02/07/20 2300  AMMONIA 24   CBC: Recent Labs  Lab 02/08/20 0424 02/09/20 0604 02/10/20 0548 02/11/20  0423 02/12/20 0529  WBC 8.9 7.6 7.1 6.6 7.0  NEUTROABS  --   --   --   --  4.1  HGB 13.2 12.6* 12.1* 11.4* 11.7*  HCT 39.3 38.0* 36.1* 34.2* 35.7*  MCV 82.6 82.6 82.8 83.6 83.6  PLT 141* 147* 168 179 221   Cardiac Enzymes: No results for input(s): CKTOTAL, CKMB, CKMBINDEX, TROPONINI in the last 168 hours. BNP: Invalid input(s): POCBNP CBG: Recent Labs  Lab 02/10/20 1910 02/10/20 2305 02/11/20 0308 02/11/20 0715 02/11/20 1120  GLUCAP 249* 232* 270* 222* 239*       IMAGING RESULTS:  Imaging: No results found. _0 @ No results found.    ASSESSMENT AND PLAN    -Multidisciplinary rounds held today    Acutely comatose state    - likely due to septic encephalopathy     - discussed with wife at bedside      - patient will need to have airway secured and treat underlying cause     - CT head when more stable -correct electrolytes -empiric abx -lactate  -procalcitonin -neurology evaluation when sepsis treated    Acute cholecystitis     -surgery on  case - spoke with Thedore Mins - appreciate input  -s/p cholecystostomy tube - frank pus and some serosang active draining  - empiric Zosyn IV ICU monitoring    Left renal mass  - Urology on case - Dr Bernardo Heater - appreciate input  Acute Renal Failure-stage 2 most likely due to septic nephropathy  resolving -follow chem 7 -follow UO -continue Foley Catheter-assess need daily   Transaminitis In context of sepsis acute cholecysititis and shock physiology  - GI on case - appreciate input - workup in progress    - hepatitis panel negative  - intubated and sedated - minimal sedation to achieve a RASS goal: -1 Wake up assessment pending   Sepsis    -present on admission    - due to intraabdominal infection - acute cholecystitis -use vasopressors to keep MAP>65 -follow ABG and LA -follow up cultures -emperic ABX -consider stress dose steroids   ID -continue IV abx as prescibed -follow up cultures  GI/Nutrition GI PROPHYLAXIS as indicated DIET-->TF's as tolerated Constipation protocol as indicated  ENDO - ICU hypoglycemic\Hyperglycemia protocol -check FSBS per protocol   ELECTROLYTES -follow labs as needed -replace as needed -pharmacy consultation   DVT/GI PRX ordered -SCDs  TRANSFUSIONS AS NEEDED MONITOR FSBS ASSESS the need for LABS as needed   Critical care provider statement:    Critical care time (minutes):  33   Critical care time was exclusive of:  Separately billable procedures and treating other patients   Critical care was necessary to treat or prevent imminent or life-threatening deterioration of the following conditions:  acute cholecystitis, acutely comatose, encephalopathy, sepsis, transaminits, multiple comorbid conditions   Critical care was time spent personally by me on the following activities:  Development of treatment plan with patient or surrogate, discussions with consultants, evaluation of patient's response to treatment, examination of patient,  obtaining history from patient or surrogate, ordering and performing treatments and interventions, ordering and review of laboratory studies and re-evaluation of patient's condition.  I assumed direction of critical care for this patient from another provider in my specialty: no    This document was prepared using Dragon voice recognition software and may include unintentional dictation errors.    Ottie Glazier, M.D.  Division of [place]

## 2020-02-13 ENCOUNTER — Inpatient Hospital Stay: Payer: Medicare Other

## 2020-02-13 LAB — CBC
HCT: 39.3 % (ref 39.0–52.0)
Hemoglobin: 13.2 g/dL (ref 13.0–17.0)
MCH: 27.6 pg (ref 26.0–34.0)
MCHC: 33.6 g/dL (ref 30.0–36.0)
MCV: 82 fL (ref 80.0–100.0)
Platelets: 261 10*3/uL (ref 150–400)
RBC: 4.79 MIL/uL (ref 4.22–5.81)
RDW: 14.5 % (ref 11.5–15.5)
WBC: 7.5 10*3/uL (ref 4.0–10.5)
nRBC: 0 % (ref 0.0–0.2)

## 2020-02-13 LAB — BASIC METABOLIC PANEL
Anion gap: 10 (ref 5–15)
BUN: 28 mg/dL — ABNORMAL HIGH (ref 8–23)
CO2: 27 mmol/L (ref 22–32)
Calcium: 9.5 mg/dL (ref 8.9–10.3)
Chloride: 105 mmol/L (ref 98–111)
Creatinine, Ser: 0.94 mg/dL (ref 0.61–1.24)
GFR, Estimated: >60 mL/min (ref >60)
Glucose, Bld: 309 mg/dL — ABNORMAL HIGH (ref 70–99)
Potassium: 4 mmol/L (ref 3.5–5.1)
Sodium: 142 mmol/L (ref 135–145)

## 2020-02-13 LAB — PHOSPHORUS: Phosphorus: 2.9 mg/dL (ref 2.5–4.6)

## 2020-02-13 LAB — MAGNESIUM: Magnesium: 1.8 mg/dL (ref 1.7–2.4)

## 2020-02-13 MED ORDER — ONDANSETRON HCL 4 MG PO TABS: 4.0000 mg | ORAL_TABLET | Freq: Four times a day (QID) | ORAL | Status: DC | PRN

## 2020-02-13 MED ORDER — CLONIDINE HCL 0.2 MG/24HR TD PTWK
0.2000 mg | MEDICATED_PATCH | TRANSDERMAL | Status: DC
  Administered 2020-02-13: 12:00:00 0.2 mg via TRANSDERMAL
  Filled 2020-02-13: qty 1

## 2020-02-13 MED ORDER — PANTOPRAZOLE SODIUM 40 MG IV SOLR
40.0000 mg | Freq: Two times a day (BID) | INTRAVENOUS | Status: DC
  Administered 2020-02-13 – 2020-02-15 (×4): 40 mg via INTRAVENOUS
  Filled 2020-02-13 (×4): qty 40

## 2020-02-13 MED ORDER — CHLORHEXIDINE GLUCONATE 0.12 % MT SOLN
OROMUCOSAL | Status: AC
  Filled 2020-02-13: qty 15

## 2020-02-13 MED ORDER — MAGNESIUM SULFATE 2 GM/50ML IV SOLN
2.0000 g | Freq: Once | INTRAVENOUS | Status: AC
  Administered 2020-02-13: 09:00:00 2 g via INTRAVENOUS
  Filled 2020-02-13: qty 50

## 2020-02-13 MED ORDER — ACETAMINOPHEN 160 MG/5ML PO SOLN
1000.0000 mg | Freq: Three times a day (TID) | ORAL | Status: DC | PRN
  Administered 2020-02-16: 1000 mg via ORAL
  Filled 2020-02-13 (×3): qty 40.6

## 2020-02-13 MED ORDER — ONDANSETRON HCL 4 MG/2ML IJ SOLN
4.0000 mg | Freq: Four times a day (QID) | INTRAMUSCULAR | Status: DC | PRN
  Administered 2020-02-14: 21:00:00 4 mg via INTRAVENOUS
  Filled 2020-02-13: qty 2

## 2020-02-13 NOTE — Progress Notes (Signed)
Shift summary-  Patient's Sys BP 160-170 throughtout shift provider aware no new orders.  Patients febrile throughout shift provider aware cooling blanket applied and tylenol given.   Sedation increased due to agitation throughout shift

## 2020-02-13 NOTE — Progress Notes (Signed)
Patient extubated to BiPap 10/5 and 33%, with no complications, per MD request.

## 2020-02-13 NOTE — Progress Notes (Signed)
[facility] Day(s): 6.   Post op day(s):  Marland Kitchen   Interval History: Patient seen and examined, no acute events or new complaints overnight.  Patient continue critically ill, sedated on mechanical ventilation.  No clinical deterioration.  Drain continues to be in place with seropurulent output.  Vital signs in last 24 hours: [min-max] current  Temp:  [91.76 F (33.2 C)-101.84 F (38.8 C)] 101.84 F (38.8 C) (12/26 0900) Pulse Rate:  [59-89] 84 (12/26 1121) Resp:  [20-27] 26 (12/26 1121) BP: (129-179)/(65-91) 169/81 (12/26 1100) SpO2:  [93 %-98 %] 93 % (12/26 1121) FiO2 (%):  [30 %] 30 % (12/26 0705)     Height: 5' 9"  (175.3 cm) Weight: 102.1 kg BMI (Calculated): 33.21   Physical Exam:  Constitutional: Critically ill, sedated on mechanical ventilation Respiratory: Mechanical ventilation, decreased breath sounds Cardiovascular: regular rate and sinus rhythm  Gastrointestinal: soft, non-tender, and non-distended.  Cholecystostomy in place  Labs:  CBC Latest Ref Rng & Units 02/13/2020 02/12/2020 02/11/2020  WBC 4.0 - 10.5 K/uL 7.5 7.0 6.6  Hemoglobin 13.0 - 17.0 g/dL 13.2 11.7(L) 11.4(L)  Hematocrit 39.0 - 52.0 % 39.3 35.7(L) 34.2(L)  Platelets 150 - 400 K/uL 261 221 179   CMP Latest Ref Rng & Units 02/13/2020 02/12/2020 02/11/2020  Glucose 70 - 99 mg/dL 309(H) 261(H) 262(H)  BUN 8 - 23 mg/dL 28(H) 25(H) 27(H)  Creatinine 0.61 - 1.24 mg/dL 0.94 0.88 1.02  Sodium 135 - 145 mmol/L 142 140 139  Potassium 3.5 - 5.1 mmol/L 4.0 3.6 3.6  Chloride 98 - 111 mmol/L 105 104 105  CO2 22 - 32 mmol/L 27 25 27   Calcium 8.9 - 10.3 mg/dL 9.5 9.3 8.9  Total Protein 6.5 - 8.1 g/dL - - 6.1(L)  Total Bilirubin 0.3 - 1.2 mg/dL - - 1.2  Alkaline Phos 38 - 126 U/L - - 177(H)  AST 15 - 41 U/L - - 42(H)  ALT 0 - 44 U/L - - 222(H)    Imaging studies: No new pertinent imaging studies   Assessment/Plan:  64 y.o. male with septicemia, possibly transient, now requiring  ventilator support, secondary to cholecystitis s/p percutaneous cholecystostomy tube placement (68/08), complicated by pertinent comorbidities includingsignificant cardiovascular disease, CAD s/p PCI  Patient continue critically ill mechanical ventilation.  As per discussion with ICU physician will have a second trial of extubation today.  There has been no clinical deterioration regarding cholecystitis.  Cholecystostomy drain working adequately.  There is adequate output.  Still on the seropurulent side.  Agreed to continue IV antibiotic therapy.  Appreciate critical care management by ICU physician.  We will continue to follow along.  Arnold Long, MD

## 2020-02-13 NOTE — Progress Notes (Signed)
Rt at bedside with NP, requesting to place pt onto nasal cannula at this time. RT put patient bipap on standby and placed pt on nasal cannula at 5L/[place]. Pt is saturating 100% and in no distress at this time and RT will wean accordingly throughout the night

## 2020-02-13 NOTE — Progress Notes (Signed)
 CRITICAL CARE NOTE    Name: Kenneth Osborn MRN: [id number] DOB: 07/12/1955     LOS: 6     SUBJECTIVE FINDINGS & SIGNIFICANT EVENTS    Patient description:   64 yo male with PMH of coronary artery disease status post stent angioplasty, COPD, chronic low back pain, GERD, hypertension, diabetes mellitus with complications of stage III kidney disease and Mnire's disease who presents to the emergency room for evaluation of chest pain.  He reported in ED that pain is mostly across his chest with radiation to the right arm associated with fever, chills and fatigue.  He complained of nausea but denies having any abdominal pain.  Patient felt that he may have infection with coronavirus but denies having any known exposure.  He denied having any diaphoresis, no palpitations, no shortness of breath but he has felt dizzy and lightheaded.  He denied having any diarrhea, no constipation, no melena stools, no hematochezia, no urinary frequency, no nocturia, no cough, no headache.  He has chronic low back pain but has had multiple back surgeries and stated that there has not been a change in the intensity of his pain.  In the ER he was noted to have a fever with a T-max of 101.4 F, he was tachycardic and tachypneic as well.  Labs show sodium 133, calcium 9.9, chloride 99, bicarbonate 20, glucose 240, BUN 35, creatinine 1.84, calcium 10, anion gap 15, alkaline phosphatase 115, albumin 4.2, lipase 22, AST 901, ALT 1275, total protein 7.6, bilirubin 3.6 troponin 8, lactic acid 3.1, white count 10.9, hemoglobin 15.9 hematocrit 47.3, MCV 82, RDW 13.8, platelet count 212, PT 11.9, INR 0.9  Respiratory viral panel is negative  Gallbladder ultrasound shows gallbladder sludge with findings that may represent sequelae of acalculus cholecystitis CT  scan of abdomen and pelvis showed mild haziness of the gallbladder wall and pericholecystic fat. No calcified gallstones. Ultrasound may provide better evaluation of the gallbladder if there is a concern for acute cholecystitis. 5.6 x 5.9 heterogeneously enhancing mass in the upper pole of the left kidney most concerning for malignancy.  CT angiogram of the chest is negative for pulmonary embolism  Chest x-ray reviewed by me shows clear lungs  Twelve-lead EKG reviewed by me shows sinus tachycardia with a left posterior fascicular block  Patient is a 64-year-old patient male who presents to the emergency room for evaluation of chest pain was noted to be febrile, tachycardic and tachypneic with an elevated lactic acid level. White count is slightly elevated at 10.9 and source of sepsis appears to be secondary to acute acalculous cholecystitis.  He will be admitted to the hospital for further evaluation.    Patient was in PACU for cholecystostomy tube placement but continued to deteriorate and required increased O2 as well as decreasing mentation.  During my evaluation patients wife was present and was able to give additional details.  Patient is unresponsive with severe acute hypoxemic respiratory failure and in circulatory shock.   PCCM consultation for likely septic shock with multiorgan failure in acutely comatose state.     02/10/20- patient on MV RASS-1. Awakening trial following verbal communication. RUQ drain with less blood more serosang.  Consulted urology for L renal mass - spoke with Dr Stoioff.  Patient seen by surgery today - spoke with Dr Rodenberg.    02/11/20- patient is on 40% FiO2. Spoke with wife and reviewed care plan and answered questions.   02/12/20-  Patient without overnight events.  Plan for SBT.   reviewed with wife at bedside.     02/13/20- patient for SBT today.  Spoke with wife at bedside. Reviewed care plan with surgeon Dr Cintron.    Lines/tubes : Biliary Tube RUQ  (Active)    Microbiology/Sepsis markers: Results for orders placed or performed during the hospital encounter of 02/07/20  Resp Panel by RT-PCR (Flu A&B, Covid) Nasopharyngeal Swab     Status: None   Collection Time: 02/07/20  5:46 PM   Specimen: Nasopharyngeal Swab; Nasopharyngeal(NP) swabs in vial transport medium  Result Value Ref Range Status   SARS Coronavirus 2 by RT PCR NEGATIVE NEGATIVE Final    Comment: (NOTE) SARS-CoV-2 target nucleic acids are NOT DETECTED.  The SARS-CoV-2 RNA is generally detectable in upper respiratory specimens during the acute phase of infection. The lowest concentration of SARS-CoV-2 viral copies this assay can detect is 138 copies/mL. A negative result does not preclude SARS-Cov-2 infection and should not be used as the sole basis for treatment or other patient management decisions. A negative result may occur with  improper specimen collection/handling, submission of specimen other than nasopharyngeal swab, presence of viral mutation(s) within the areas targeted by this assay, and inadequate number of viral copies(<138 copies/mL). A negative result must be combined with clinical observations, patient history, and epidemiological information. The expected result is Negative.  Fact Sheet for Patients:  https://www.fda.gov/media/152166/download  Fact Sheet for Healthcare Providers:  https://www.fda.gov/media/152162/download  This test is no t yet approved or cleared by the [place] FDA and  has been authorized for detection and/or diagnosis of SARS-CoV-2 by FDA under an Emergency Use Authorization (EUA). This EUA will remain  in effect (meaning this test can be used) for the duration of the COVID-19 declaration under Section 564(b)(1) of the Act, 21 U.S.C.section 360bbb-3(b)(1), unless the authorization is terminated  or revoked sooner.       Influenza A by PCR NEGATIVE NEGATIVE Final   Influenza B by PCR NEGATIVE NEGATIVE Final     Comment: (NOTE) The Xpert Xpress SARS-CoV-2/FLU/RSV plus assay is intended as an aid in the diagnosis of influenza from Nasopharyngeal swab specimens and should not be used as a sole basis for treatment. Nasal washings and aspirates are unacceptable for Xpert Xpress SARS-CoV-2/FLU/RSV testing.  Fact Sheet for Patients: https://www.fda.gov/media/152166/download  Fact Sheet for Healthcare Providers: https://www.fda.gov/media/152162/download  This test is not yet approved or cleared by the [place] FDA and has been authorized for detection and/or diagnosis of SARS-CoV-2 by FDA under an Emergency Use Authorization (EUA). This EUA will remain in effect (meaning this test can be used) for the duration of the COVID-19 declaration under Section 564(b)(1) of the Act, 21 U.S.C. section 360bbb-3(b)(1), unless the authorization is terminated or revoked.  Performed at [facility], [address] [place], [place] [address]   Culture, blood (Routine X 2) w Reflex to ID Panel     Status: None   Collection Time: 02/07/20  5:46 PM   Specimen: BLOOD  Result Value Ref Range Status   Specimen Description BLOOD BLOOD RIGHT FOREARM  Final   Special Requests   Final    BOTTLES DRAWN AEROBIC AND ANAEROBIC Blood Culture adequate volume   Culture   Final    NO GROWTH 5 DAYS Performed at [facility], [address] [place], [place] [address]    Report Status 02/12/2020 FINAL  Final  Culture, blood (Routine X 2) w Reflex to ID Panel     Status: None   Collection Time: 02/07/20    5:46 PM   Specimen: BLOOD  Result Value Ref Range Status   Specimen Description BLOOD LEFT ANTECUBITAL  Final   Special Requests   Final    BOTTLES DRAWN AEROBIC AND ANAEROBIC Blood Culture adequate volume   Culture   Final    NO GROWTH 5 DAYS Performed at [facility], [address] [place], [place] [address]    Report Status 02/12/2020 FINAL  Final  Urine Culture     Status:  Abnormal   Collection Time: 02/07/20  5:46 PM   Specimen: Urine, Random  Result Value Ref Range Status   Specimen Description   Final    URINE, RANDOM Performed at [facility], [address] [place], [place] [address]    Special Requests   Final    NONE Performed at [facility], [address] [place], [place] [address]    Culture (A)  Final    <10,000 COLONIES/mL INSIGNIFICANT GROWTH Performed at [facility], [address] [place], [place] [address]    Report Status 02/08/2020 FINAL  Final  Aerobic/Anaerobic Culture (surgical/deep wound)     Status: None (Preliminary result)   Collection Time: 02/09/20  3:00 PM   Specimen: Gallbladder; Abscess  Result Value Ref Range Status   Specimen Description   Final    GALL BLADDER Performed at [facility], [address] [place], [place] [address]    Special Requests   Final    NONE Performed at [facility], [address] [place], [place] [address]    Gram Stain   Final    ABUNDANT WBC PRESENT, PREDOMINANTLY PMN MODERATE GRAM NEGATIVE RODS Performed at [facility], [address] [place], [place] [address]    Culture   Final    FEW KLEBSIELLA PNEUMONIAE NO ANAEROBES ISOLATED; CULTURE IN PROGRESS FOR 5 DAYS    Report Status PENDING  Incomplete   Organism ID, Bacteria KLEBSIELLA PNEUMONIAE  Final      Susceptibility   Klebsiella pneumoniae - MIC*    AMPICILLIN >=32 RESISTANT Resistant     CEFAZOLIN <=4 SENSITIVE Sensitive     CEFEPIME <=0.12 SENSITIVE Sensitive     CEFTAZIDIME <=1 SENSITIVE Sensitive     CEFTRIAXONE <=0.25 SENSITIVE Sensitive     CIPROFLOXACIN <=0.25 SENSITIVE Sensitive     GENTAMICIN <=1 SENSITIVE Sensitive     IMIPENEM <=0.25 SENSITIVE Sensitive     TRIMETH/SULFA <=20 SENSITIVE Sensitive     AMPICILLIN/SULBACTAM 4 SENSITIVE Sensitive     PIP/TAZO <=4 SENSITIVE Sensitive     * FEW KLEBSIELLA PNEUMONIAE  MRSA PCR Screening     Status: None    Collection Time: 02/10/20 12:00 AM   Specimen: Nasal Mucosa; Nasopharyngeal  Result Value Ref Range Status   MRSA by PCR NEGATIVE NEGATIVE Final    Comment:        The GeneXpert MRSA Assay (FDA approved for NASAL specimens only), is one component of a comprehensive MRSA colonization surveillance program. It is not intended to diagnose MRSA infection nor to guide or monitor treatment for MRSA infections. Performed at [facility], [address] [place], [place] [address]     Anti-infectives:  Anti-infectives (From admission, onward)   Start     Dose/Rate Route Frequency Ordered Stop   02/08/20 1800  cefTRIAXone (ROCEPHIN) 2 g in sodium chloride 0.9 % 100 mL IVPB  Status:  Discontinued        2 g 200 mL/hr over 30   Minutes Intravenous Every 24 hours 02/07/20 2230 02/07/20 2300   02/07/20 2330  piperacillin-tazobactam (ZOSYN) IVPB 3.375 g        3.375 g 12.5 mL/hr over 240 Minutes Intravenous Every 8 hours 02/07/20 2230     02/07/20 2230  metroNIDAZOLE (FLAGYL) IVPB 500 mg  Status:  Discontinued        500 mg 100 mL/hr over 60 Minutes Intravenous  Once 02/07/20 2223 02/07/20 2259   02/07/20 1930  cefTRIAXone (ROCEPHIN) 2 g in sodium chloride 0.9 % 100 mL IVPB        2 g 200 mL/hr over 30 Minutes Intravenous  Once 02/07/20 1918 02/07/20 2101       Consults: Treatment Team:  Rodenberg, Denny, MD Stoioff, Scott C, MD     PAST MEDICAL HISTORY   Past Medical History:  Diagnosis Date  . Acute postoperative pain 12/03/2018  . Allergic rhinitis 12/30/2012  . Anginal pain (HCC)   . Bronchitis    hx of  . Can't get food down 08/12/2014  . Chronic back pain    thoracic area  . Concussion 09/2015  . COPD (chronic obstructive pulmonary disease) (HCC)   . Coronary artery disease    99% blockage  . DDD (degenerative disc disease), cervical   . Dehydration symptoms    2019  . 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 Dysphagia   .  GERD (gastroesophageal reflux disease)   . History of Meniere's disease 12/21/2014  . History of thoracic spine surgery (S/P T9-10 IVD spacer) 12/21/2014  . Hypercholesteremia   . Hyperlipidemia   . Hypertension    sees Dr. John walker [facility]  . 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 Myocardial infarction (HCC)    Sees Dr. Kow[place]i, [facility]  . Neuromuscular disorder (HCC)    diabetic neuropathy in feet  . Psychosis (HCC)   . Short-segment Barrett's esophagus   . Sleep apnea    uses cpap and 2 L o2 at hs     SURGICAL HISTORY   Past Surgical History:  Procedure Laterality Date  . APPENDECTOMY    . ARTHRODESIS ANTERIOR ANTERIOR CERVICLE SPINE  01/04/2013  . BACK SURGERY     fusion thoracic area  . CARDIAC CATHETERIZATION     may 2012 and Nov 20, 2010  . CARDIAC CATHETERIZATION N/A 06/15/2015   Procedure: Left Heart Cath and Coronary Angiography;  Surgeon: Bruce J Kowalski, MD;  Location: [facility] INVASIVE [facility] LAB;  Service: Cardiovascular;  Laterality: N/A;  . CARDIAC CATHETERIZATION N/A 06/15/2015   Procedure: Coronary Stent Intervention;  Surgeon: Alexander Paraschos, MD;  Location: [facility];  Service: Cardiovascular;  Laterality: N/A;  . CARDIOVASCULAR STRESS TEST     jan 2014  . COLONOSCOPY WITH PROPOFOL N/A 09/19/2014   Procedure: COLONOSCOPY WITH PROPOFOL;  Surgeon: Robert T Elliott, MD;  Location: [facility] ENDOSCOPY;  Service: Endoscopy;  Laterality: N/A;  . CORONARY ANGIOPLASTY     stent placement  . ESOPHAGOGASTRODUODENOSCOPY N/A 09/19/2014   Procedure: ESOPHAGOGASTRODUODENOSCOPY (EGD);  Surgeon: Robert T Elliott, MD;  Location: [facility] ENDOSCOPY;  Service: Endoscopy;  Laterality: N/A;  . IR PERC CHOLECYSTOSTOMY  02/09/2020  . LABRINTHECTOMY     1999 right ear  . LUMBAR SPINAL CORD SIMULATOR LEAD REMOVAL Right 08/09/2019   Procedure: REMOVAL SPINAL CORD STIMULATOR PERCUTANEOUS LEADS, REMOVAL PULSE GENERATOR;  Surgeon: Cook, Steven, MD;  Location:  [facility] ORS;  Service: Neurosurgery;  Laterality: Right;    LOCAL WITH MAC  . mastoid shunt     left, 2002, 1997 right ear, 1980 right ear  . PULSE GENERATOR IMPLANT N/A 01/18/2019   Procedure: MEDTRONIC SPINAL CORD STIMULATOR BATTERY EXCHANGE;  Surgeon: [name], MD;  Location: [facility] ORS;  Service: Neurosurgery;  Laterality: N/A;  . SAVORY DILATION N/A 09/19/2014   Procedure: SAVORY DILATION;  Surgeon: Manya Silvas, MD;  Location: [facility] ENDOSCOPY;  Service: Endoscopy;  Laterality: N/A;  . SHOULDER ARTHROSCOPY WITH SUBACROMIAL DECOMPRESSION Left 04/06/2012   Procedure: SHOULDER ARTHROSCOPY WITH SUBACROMIAL DECOMPRESSION;  Surgeon: Vickey Huger, MD;  Location: [facility];  Service: Orthopedics;  Laterality: Left;  left shoulder arthroscopy, subacromial decompression and distal clavicle resection  . SPINAL CORD STIMULATOR IMPLANT Right      FAMILY HISTORY   Family History  Problem Relation Age of Onset  . Heart disease Mother   . Diabetes Mother   . Heart disease Father   . Cancer Sister   . Heart disease Maternal Aunt   . Heart disease Maternal Uncle   . Diabetes Maternal Grandmother   . Diabetes Paternal Grandmother      SOCIAL HISTORY   Social History   Tobacco Use  . Smoking status: Never Smoker  . Smokeless tobacco: Never Used  Vaping Use  . Vaping Use: Never used  Substance Use Topics  . Alcohol use: No  . Drug use: No     MEDICATIONS   Current Medication:  Current Facility-Administered Medications:  .  0.9 %  sodium chloride infusion, , Intravenous, PRN, Shawna Clamp, MD, Stopped at 02/10/20 [phone] .  acetaminophen (TYLENOL) 160 MG/5ML solution 1,000 mg, 1,000 mg, Per Tube, TID PRN, Shawna Clamp, MD, 1,000 mg at 02/13/20 1149 .  chlorhexidine (PERIDEX) 0.12 % solution, , , ,  .  chlorhexidine gluconate (MEDLINE KIT) (PERIDEX) 0.12 % solution 15 mL, 15 mL, Mouth Rinse, BID, Blakeney, Dana G, NP, 15 mL at 02/13/20 0847 .  Chlorhexidine Gluconate Cloth 2 % PADS 6 each,  6 each, Topical, Daily, Awilda Bill, NP, 6 each at 02/12/20 2134 .  cloNIDine (CATAPRES - Dosed in mg/24 hr) patch 0.2 mg, 0.2 mg, Transdermal, Weekly, Lanney Gins, Jolynn Bajorek, MD, 0.2 mg at 02/13/20 1203 .  dexmedetomidine (PRECEDEX) 400 MCG/100ML (4 mcg/mL) infusion, 0.4-1.2 mcg/kg/hr, Intravenous, Titrated, Hind Chesler, MD, Last Rate: 15.32 mL/hr at 02/13/20 1100, 0.6 mcg/kg/hr at 02/13/20 1100 .  enoxaparin (LOVENOX) injection 50 mg, 0.5 mg/kg, Subcutaneous, Q24H, Enzo Bi, MD, 50 mg at 02/12/20 2136 .  feeding supplement (PROSource TF) liquid 90 mL, 90 mL, Per Tube, TID, Jaquanna Ballentine, MD, 90 mL at 02/13/20 0839 .  feeding supplement (VITAL HIGH PROTEIN) liquid 1,000 mL, 1,000 mL, Per Tube, Q24H, Birdie Beveridge, MD, 1,000 mL at 02/12/20 1505 .  fentaNYL 2567mg in NS 2529m(1026mml) infusion-PREMIX, 0-400 mcg/hr, Intravenous, Continuous, AleOttie GlazierD, Stopped at 02/13/20 0931 .  insulin aspart (novoLOG) injection 0-20 Units, 0-20 Units, Subcutaneous, Q4H, Rust-Chester, Britton L, NP, 15 Units at 02/13/20 1155 .  insulin glargine (LANTUS) injection 10 Units, 10 Units, Subcutaneous, BID, AleOttie GlazierD, 10 Units at 02/13/20 0836 .  MEDLINE mouth rinse, 15 mL, Mouth Rinse, 10 times per day, BlaAwilda BillP, 15 mL at 02/13/20 1213 .  nitroGLYCERIN (NITROSTAT) SL tablet 0.4 mg, 0.4 mg, Sublingual, Q5 min PRN, Agbata, Tochukwu, MD .  ondansetron (ZOFRAN) tablet 4 mg, 4 mg, Per Tube, Q6H PRN **OR** ondansetron (ZOFRAN) injection 4 mg, 4 mg, Intravenous, Q6H PRN, KumShawna ClampD .  pantoprazole sodium (PROTONIX) 40 mg/20 mL oral suspension 40 mg, 40 mg, Per Tube, BID, Lanney Gins, Jamyiah Labella, MD, 40 mg at 02/13/20 1151 .  piperacillin-tazobactam (ZOSYN) IVPB 3.375 g, 3.375 g, Intravenous, Q8H, Agbata, Tochukwu, MD, Last Rate: 12.5 mL/hr at 02/13/20 1207, 3.375 g at 02/13/20 1207    ALLERGIES   Patient has no known allergies.    REVIEW OF SYSTEMS     Unable to obtain patient  is unresponsive  PHYSICAL EXAMINATION   Vital Signs: Temp:  [91.76 F (33.2 C)-101.84 F (38.8 C)] 101.5 F (38.6 C) (12/26 1200) Pulse Rate:  [59-89] 84 (12/26 1121) Resp:  [20-27] 25 (12/26 1200) BP: (134-179)/(65-92) 171/92 (12/26 1200) SpO2:  [93 %-98 %] 96 % (12/26 1200) FiO2 (%):  [30 %] 30 % (12/26 1200)  GENERAL:age appropriate HEAD: Normocephalic, atraumatic.  EYES: Pupils equal, round, reactive to light.  No scleral icterus.  MOUTH: Moist mucosal membrane. NECK: Supple. No thyromegaly. No nodules. No JVD.  PULMONARY: decreased air entry bilateraly  CARDIOVASCULAR: S1 and S2. Regular rate and rhythm. No murmurs, rubs, or gallops.  GASTROINTESTINAL: Soft, nontender, non-distended. No masses. Positive bowel sounds. No hepatosplenomegaly.  MUSCULOSKELETAL: No swelling, clubbing, or edema.  NEUROLOGIC: encephalopathic GCS7 SKIN:intact,warm,dry   PERTINENT DATA     Infusions: . sodium chloride Stopped (02/10/20 0904)  . dexmedetomidine (PRECEDEX) IV infusion 0.6 mcg/kg/hr (02/13/20 1100)  . fentaNYL infusion INTRAVENOUS Stopped (02/13/20 0931)  . piperacillin-tazobactam (ZOSYN)  IV 3.375 g (02/13/20 1207)   Scheduled Medications: . chlorhexidine      . chlorhexidine gluconate (MEDLINE KIT)  15 mL Mouth Rinse BID  . Chlorhexidine Gluconate Cloth  6 each Topical Daily  . cloNIDine  0.2 mg Transdermal Weekly  . enoxaparin (LOVENOX) injection  0.5 mg/kg Subcutaneous Q24H  . feeding supplement (PROSource TF)  90 mL Per Tube TID  . feeding supplement (VITAL HIGH PROTEIN)  1,000 mL Per Tube Q24H  . insulin aspart  0-20 Units Subcutaneous Q4H  . insulin glargine  10 Units Subcutaneous BID  . mouth rinse  15 mL Mouth Rinse 10 times per day  . pantoprazole sodium  40 mg Per Tube BID   PRN Medications: sodium chloride, acetaminophen (TYLENOL) oral liquid 160 mg/5 mL, nitroGLYCERIN, ondansetron **OR** ondansetron (ZOFRAN) IV Hemodynamic parameters:    Intake/Output: 12/25 0701 - 12/26 0700 In: 1190.5 [I.V.:1046.3; IV Piggyback:144.3] Out: 2930 [Urine:2825; Drains:105]  Ventilator  Settings: Vent Mode: PRVC FiO2 (%):  [30 %] 30 % Set Rate:  [20 bmp] 20 bmp Vt Set:  [500 mL] 500 mL PEEP:  [5 cmH20] 5 cmH20 Plateau Pressure:  [17 [id number] cmH20] 17 cmH20    LAB RESULTS:  Basic Metabolic Panel: Recent Labs  Lab 02/09/20 0604 02/10/20 0548 02/11/20 0423 02/12/20 0529 02/13/20 0528  NA 137 138 139 140 142  K 4.5 4.3 3.6 3.6 4.0  CL 104 103 105 104 105  CO2 21* _0 GLUCOSE 328* 263* 262* 261* 309*  BUN 21 20 27* 25* 28*  CREATININE 1.22 1.21 1.02 0.88 0.94  CALCIUM 9.5 9.2 8.9 9.3 9.5  MG 1.6* 1.9 1.9 1.6* 1.8  PHOS  --   --   --  3.3 2.9   Liver Function Tests: Recent Labs  Lab 02/07/20 1701 02/08/20 0424 02/09/20 0604 02/10/20 0548 02/11/20 0423 02/12/20 0529  AST 901* 359* 213* 125* 42*  --   ALT 1,275* 731* 488* 348* 222*  --   ALKPHOS 150* 128* 198* 231* 177*  --  BILITOT 5.0* 4.5* 4.0* 2.3* 1.2  --   PROT 7.6 6.1* 6.5 5.9* 6.1*  --   ALBUMIN 4.2 3.1* 3.1* 2.6* 2.5* 2.3*   Recent Labs  Lab 02/07/20 1701  LIPASE 22   Recent Labs  Lab 02/07/20 2300  AMMONIA 24   CBC: Recent Labs  Lab 02/09/20 0604 02/10/20 0548 02/11/20 0423 02/12/20 0529 02/13/20 0528  WBC 7.6 7.1 6.6 7.0 7.5  NEUTROABS  --   --   --  4.1  --   HGB 12.6* 12.1* 11.4* 11.7* 13.2  HCT 38.0* 36.1* 34.2* 35.7* 39.3  MCV 82.6 82.8 83.6 83.6 82.0  PLT 147* 168 179 221 261   Cardiac Enzymes: No results for input(s): CKTOTAL, CKMB, CKMBINDEX, TROPONINI in the last 168 hours. BNP: Invalid input(s): POCBNP CBG: Recent Labs  Lab 02/10/20 1910 02/10/20 2305 02/11/20 0308 02/11/20 0715 02/11/20 1120  GLUCAP 249* 232* 270* 222* 239*       IMAGING RESULTS:  Imaging: DG Chest Port 1 View  Result Date: 02/13/2020 CLINICAL DATA:  Ventilator dependence. EXAM: PORTABLE CHEST 1 VIEW COMPARISON:  02/09/2020  FINDINGS: 0416 hours. Endotracheal tube tip is 4.3 cm above the base of the carina. The NG tube passes into the stomach although the distal tip position is not included on the film. Esophageal temperature probe evident. Low lung volumes with basilar atelectasis/infiltrate, minimally progressed in the interval. Telemetry leads overlie the chest. IMPRESSION: 1. Interval progression of bibasilar atelectasis/infiltrate. 2. ETT tip is 4.3 cm above the base of the carina. Electronically Signed   By: Misty Stanley M.D.   On: 02/13/2020 04:53   _0 @ DG Chest Port 1 View  Result Date: 02/13/2020 CLINICAL DATA:  Ventilator dependence. EXAM: PORTABLE CHEST 1 VIEW COMPARISON:  02/09/2020 FINDINGS: 0416 hours. Endotracheal tube tip is 4.3 cm above the base of the carina. The NG tube passes into the stomach although the distal tip position is not included on the film. Esophageal temperature probe evident. Low lung volumes with basilar atelectasis/infiltrate, minimally progressed in the interval. Telemetry leads overlie the chest. IMPRESSION: 1. Interval progression of bibasilar atelectasis/infiltrate. 2. ETT tip is 4.3 cm above the base of the carina. Electronically Signed   By: Misty Stanley M.D.   On: 02/13/2020 04:53      ASSESSMENT AND PLAN    -Multidisciplinary rounds held today    Acutely comatose state    - likely due to septic encephalopathy     - discussed with wife at bedside      - patient will need to have airway secured and treat underlying cause     - CT head when more stable -correct electrolytes -empiric abx -lactate  -procalcitonin -neurology evaluation when sepsis treated    Acute cholecystitis     -surgery on case - spoke with Thedore Mins - appreciate input  -s/p cholecystostomy tube - frank pus and some serosang active draining  - empiric Zosyn IV ICU monitoring    Left renal mass  - Urology on case - Dr Bernardo Heater - appreciate input  Acute Renal Failure-stage 2 most likely  due to septic nephropathy  resolving -follow chem 7 -follow UO -continue Foley Catheter-assess need daily   Transaminitis In context of sepsis acute cholecysititis and shock physiology  - GI on case - appreciate input - workup in progress    - hepatitis panel negative  - intubated and sedated - minimal sedation to achieve a RASS goal: -1 Wake up assessment pending   Sepsis    -  present on admission    - due to intraabdominal infection - acute cholecystitis -use vasopressors to keep MAP>65 -follow ABG and LA -follow up cultures -emperic ABX -consider stress dose steroids   ID -continue IV abx as prescibed -follow up cultures  GI/Nutrition GI PROPHYLAXIS as indicated DIET-->TF's as tolerated Constipation protocol as indicated  ENDO - ICU hypoglycemic\Hyperglycemia protocol -check FSBS per protocol   ELECTROLYTES -follow labs as needed -replace as needed -pharmacy consultation   DVT/GI PRX ordered -SCDs  TRANSFUSIONS AS NEEDED MONITOR FSBS ASSESS the need for LABS as needed   Critical care provider statement:    Critical care time (minutes):  33   Critical care time was exclusive of:  Separately billable procedures and treating other patients   Critical care was necessary to treat or prevent imminent or life-threatening deterioration of the following conditions:  acute cholecystitis, acutely comatose, encephalopathy, sepsis, transaminits, multiple comorbid conditions   Critical care was time spent personally by me on the following activities:  Development of treatment plan with patient or surrogate, discussions with consultants, evaluation of patient's response to treatment, examination of patient, obtaining history from patient or surrogate, ordering and performing treatments and interventions, ordering and review of laboratory studies and re-evaluation of patient's condition.  I assumed direction of critical care for this patient from another provider in my  specialty: no    This document was prepared using Dragon voice recognition software and may include unintentional dictation errors.     , M.D.  Division of Pulmonary & Critical Care Medicine  [facility]                                                                                                        

## 2020-02-14 ENCOUNTER — Ambulatory Visit: Payer: Medicare Other | Admitting: Pain Medicine

## 2020-02-14 DIAGNOSIS — A4151 Sepsis due to Escherichia coli [E. coli]: Secondary | ICD-10-CM

## 2020-02-14 DIAGNOSIS — A4189 Other specified sepsis: Secondary | ICD-10-CM

## 2020-02-14 DIAGNOSIS — K810 Acute cholecystitis: Secondary | ICD-10-CM | POA: Diagnosis not present

## 2020-02-14 LAB — CBC
HCT: 40.4 % (ref 39.0–52.0)
Hemoglobin: 13.5 g/dL (ref 13.0–17.0)
MCH: 27.6 pg (ref 26.0–34.0)
MCHC: 33.4 g/dL (ref 30.0–36.0)
MCV: 82.6 fL (ref 80.0–100.0)
Platelets: 319 10*3/uL (ref 150–400)
RBC: 4.89 MIL/uL (ref 4.22–5.81)
RDW: 14.6 % (ref 11.5–15.5)
WBC: 10.2 10*3/uL (ref 4.0–10.5)
nRBC: 0 % (ref 0.0–0.2)

## 2020-02-14 LAB — BASIC METABOLIC PANEL
Anion gap: 10 (ref 5–15)
BUN: 30 mg/dL — ABNORMAL HIGH (ref 8–23)
CO2: 29 mmol/L (ref 22–32)
Calcium: 9.9 mg/dL (ref 8.9–10.3)
Chloride: 105 mmol/L (ref 98–111)
Creatinine, Ser: 0.85 mg/dL (ref 0.61–1.24)
GFR, Estimated: >60 mL/min (ref >60)
Glucose, Bld: 218 mg/dL — ABNORMAL HIGH (ref 70–99)
Potassium: 3.2 mmol/L — ABNORMAL LOW (ref 3.5–5.1)
Sodium: 144 mmol/L (ref 135–145)

## 2020-02-14 LAB — GLUCOSE, CAPILLARY
Glucose-Capillary: 198 mg/dL — ABNORMAL HIGH (ref 70–99)
Glucose-Capillary: 216 mg/dL — ABNORMAL HIGH (ref 70–99)
Glucose-Capillary: 218 mg/dL — ABNORMAL HIGH (ref 70–99)
Glucose-Capillary: 230 mg/dL — ABNORMAL HIGH (ref 70–99)
Glucose-Capillary: 230 mg/dL — ABNORMAL HIGH (ref 70–99)
Glucose-Capillary: 234 mg/dL — ABNORMAL HIGH (ref 70–99)
Glucose-Capillary: 239 mg/dL — ABNORMAL HIGH (ref 70–99)
Glucose-Capillary: 243 mg/dL — ABNORMAL HIGH (ref 70–99)
Glucose-Capillary: 250 mg/dL — ABNORMAL HIGH (ref 70–99)
Glucose-Capillary: 252 mg/dL — ABNORMAL HIGH (ref 70–99)
Glucose-Capillary: 266 mg/dL — ABNORMAL HIGH (ref 70–99)
Glucose-Capillary: 286 mg/dL — ABNORMAL HIGH (ref 70–99)
Glucose-Capillary: 291 mg/dL — ABNORMAL HIGH (ref 70–99)
Glucose-Capillary: 304 mg/dL — ABNORMAL HIGH (ref 70–99)
Glucose-Capillary: 306 mg/dL — ABNORMAL HIGH (ref 70–99)
Glucose-Capillary: 337 mg/dL — ABNORMAL HIGH (ref 70–99)
Glucose-Capillary: 361 mg/dL — ABNORMAL HIGH (ref 70–99)
Glucose-Capillary: 222 mg/dL — ABNORMAL HIGH (ref 70–99)
Glucose-Capillary: 236 mg/dL — ABNORMAL HIGH (ref 70–99)
Glucose-Capillary: 275 mg/dL — ABNORMAL HIGH (ref 70–99)
Glucose-Capillary: 216 mg/dL — ABNORMAL HIGH (ref 70–99)

## 2020-02-14 LAB — AEROBIC/ANAEROBIC CULTURE W GRAM STAIN (SURGICAL/DEEP WOUND)

## 2020-02-14 LAB — MAGNESIUM: Magnesium: 2 mg/dL (ref 1.7–2.4)

## 2020-02-14 LAB — PHOSPHORUS: Phosphorus: 2.6 mg/dL (ref 2.5–4.6)

## 2020-02-14 MED ORDER — ADULT MULTIVITAMIN W/MINERALS CH
1.0000 | ORAL_TABLET | Freq: Every day | ORAL | Status: DC
  Administered 2020-02-15 – 2020-02-18 (×4): 1 via ORAL
  Filled 2020-02-14 (×4): qty 1

## 2020-02-14 MED ORDER — INSULIN GLARGINE 100 UNIT/ML ~~LOC~~ SOLN
15.0000 [IU] | Freq: Two times a day (BID) | SUBCUTANEOUS | Status: DC
  Administered 2020-02-15 – 2020-02-16 (×4): 15 [IU] via SUBCUTANEOUS
  Filled 2020-02-14 (×7): qty 0.15

## 2020-02-14 MED ORDER — ALPRAZOLAM 0.5 MG PO TABS
0.5000 mg | ORAL_TABLET | Freq: Every day | ORAL | Status: DC
  Administered 2020-02-14 – 2020-02-17 (×4): 0.5 mg via ORAL
  Filled 2020-02-14 (×4): qty 1

## 2020-02-14 MED ORDER — ENSURE MAX PROTEIN PO LIQD
11.0000 [oz_av] | Freq: Two times a day (BID) | ORAL | Status: DC
  Administered 2020-02-14 – 2020-02-18 (×7): 11 [oz_av] via ORAL
  Filled 2020-02-14: qty 330

## 2020-02-14 MED ORDER — POTASSIUM CHLORIDE 10 MEQ/100ML IV SOLN
10.0000 meq | INTRAVENOUS | Status: DC
  Administered 2020-02-14 (×6): 10 meq via INTRAVENOUS
  Filled 2020-02-14 (×6): qty 100

## 2020-02-14 MED ORDER — HYDRALAZINE HCL 20 MG/ML IJ SOLN: 10.0000 mg | Freq: Four times a day (QID) | INTRAMUSCULAR | Status: DC | PRN

## 2020-02-14 NOTE — Progress Notes (Signed)
0730 Patient yelling help. Confused to time,date and location. Very rude and insist he must have water. Explained he was just recently extubated and has to have a swallow evaluation before he can eat or drink. 0745 Yelling help again- reoriented again. States he needs his wife. Explained wife had been here everyday,all day since admission. Explained he is much better today and he needs to rest. 0800 Pulled out left arm IV. Stated he wanted to go home. Explained his current state and he needed to get better before he can go home. Patient very biligerant and stated he WAS going home. Bilateral mitts applied. 0815 Pulled mitts off with teeth. Reapplied. 0830 Mitts pulled off again. NT in with patient. 0915 Speech therapist in to see patient.

## 2020-02-14 NOTE — Consult Note (Signed)
Patient with h/o OSA; psychosis; CAD; HTN; HLD; DM; CAD; COPD; and chronic back pain presenting with acute hypoxic respiratory failure on 12/20 with sepsis and cholecystitis.  He had a perc drain placed and grew Klebsiella in his blood cultures.  He was extubated yesterday and mildly delirious overnight but AMS has improved.  He appears to be stable for transfer out of the unit and TRH will assume care tomorrow.  Carlyon Shadow, M.D.

## 2020-02-14 NOTE — Progress Notes (Signed)
Wife Pamala Hurry notified of transfer to room 135 later tonight.

## 2020-02-14 NOTE — Progress Notes (Addendum)
Tolerating full liquid diet. Patient does not have much of an appetite. More oriented than earlier today. Also more pleasant than earlier. Had 4 stools today , all soft and semi formed. Now floor care. Afebrile today.

## 2020-02-14 NOTE — Evaluation (Signed)
Clinical/Bedside Swallow Evaluation Patient Details  Name: Kenneth Osborn MRN: [id number] Date of Birth: 1955/07/31  Today's Date: 02/14/2020 Time: SLP Start Time (ACUTE ONLY): 0900 SLP Stop Time (ACUTE ONLY): 0949 SLP Time Calculation (min) (ACUTE ONLY): 49.27 min  Past Medical History:  Past Medical History:  Diagnosis Date  . Acute postoperative pain 12/03/2018  . Allergic rhinitis 12/30/2012  . Anginal pain ([place])   . Bronchitis    hx of  . Can't get food down 08/12/2014  . Chronic back pain    thoracic area  . Concussion 09/2015  . COPD (chronic obstructive pulmonary disease) ([place])   . Coronary artery disease    99% blockage  . DDD (degenerative disc disease), cervical   . Dehydration symptoms    2019  . 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 Dysphagia   . GERD (gastroesophageal reflux disease)   . History of Meniere's disease 12/21/2014  . History of thoracic spine surgery (S/P T9-10 IVD spacer) 12/21/2014  . Hypercholesteremia   . Hyperlipidemia   . Hypertension    sees Dr. Jenny Reichmann walker Jefm Bryant  . 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 Myocardial infarction [facility])    Sees Dr. Drema [place], [place] [facility]  . Neuromuscular disorder ([place])    diabetic neuropathy in feet  . Psychosis ([place])   . Short-segment Barrett's esophagus   . Sleep apnea    uses cpap and 2 L o2 at hs   Past Surgical History:  Past Surgical History:  Procedure Laterality Date  . APPENDECTOMY    . ARTHRODESIS ANTERIOR ANTERIOR CERVICLE SPINE  01/04/2013  . BACK SURGERY     fusion thoracic area  . CARDIAC CATHETERIZATION     may 2012 and Nov 20, 2010  . CARDIAC CATHETERIZATION N/A 06/15/2015   Procedure: Left Osborn Cath and Coronary Angiography;  Surgeon: Corey Skains, MD;  Location: [place] CV LAB;  Service: Cardiovascular;  Laterality: N/A;  . CARDIAC CATHETERIZATION N/A 06/15/2015   Procedure: Coronary Stent Intervention;  Surgeon:  Isaias Cowman, MD;  Location: [place] CV LAB;  Service: Cardiovascular;  Laterality: N/A;  . CARDIOVASCULAR STRESS TEST     jan 2014  . COLONOSCOPY WITH PROPOFOL N/A 09/19/2014   Procedure: COLONOSCOPY WITH PROPOFOL;  Surgeon: Manya Silvas, MD;  Location: [facility] ENDOSCOPY;  Service: Endoscopy;  Laterality: N/A;  . CORONARY ANGIOPLASTY     stent placement  . ESOPHAGOGASTRODUODENOSCOPY N/A 09/19/2014   Procedure: ESOPHAGOGASTRODUODENOSCOPY (EGD);  Surgeon: Manya Silvas, MD;  Location[facility] END[facility]COPY;  Service: Endoscopy;  Laterality: N/A;  . IR PERC CHOLECYSTOSTOMY  02/09/2020  . [place] right ear  . LUMBAR SPINAL CORD SIMULATOR LEAD REMOVAL Right 08/09/2019   Procedure: REMOVAL SPINAL CORD STIMULATOR PERCUTANEOUS LEADS, REMOVAL PULSE GENERATOR;  Surgeon: [name], MD;  Location: [facility] ORS;  Service: Neurosurgery;  Laterality: Right;  LOCAL WITH MAC  . mastoid shunt     left, 2002, 1997 right ear, 1980 right ear  . PULSE GENERATOR IMPLANT N/A 01/18/2019   Procedure: MEDTRONIC SPINAL CORD STIMULATOR BATTERY EXCHANGE;  Surgeon: [name], MD;  Location: [facility] ORS;  Service: Neurosurgery;  Laterality: N/A;  . SAVORY DILATION N/A 09/19/2014   Procedure: SAVORY DILATION;  Surgeon: Manya Silvas, MD;  [facility] END[facility];  Service: Endoscopy;  Laterality: N/A;  . SHOULDER ARTHROSCOPY WITH SUBACROMIAL DECOMPRESSION Left 04/06/2012   Procedure: SHOULDER ARTHROSCOPY WITH SUBACROMIAL DECOMPRESSION;  Surgeon:  Vickey Huger, MD;  Location: [place];  Service: Orthopedics;  Laterality: Left;  left shoulder arthroscopy, subacromial decompression and distal clavicle resection  . SPINAL CORD STIMULATOR IMPLANT Right    HPI:  Per admitting H&P "Kenneth Osborn is a 64 y.o. male with medical history significant for coronary artery disease status post stent angioplasty, COPD, chronic low back pain, GERD, hypertension, diabetes mellitus with complications of stage III kidney  disease and Mnire's disease who presents to the emergency room for evaluation of chest pain.  Pain is mostly across his chest with radiation to the right arm associated with fever, chills and fatigue.  He complains of nausea but denies having any abdominal pain.  Patient felt that he may have infection with coronavirus but denies having any known exposure.  He denies having any diaphoresis, no palpitations, no shortness of breath but he has felt dizzy and lightheaded.  He denies having any diarrhea, no constipation, no melena stools, no hematochezia, no urinary frequency, no nocturia, no cough, no headache.  He has chronic low back pain but has had multiple back surgeries and states that there has not been a change in the intensity of his pain.  In the ER he was noted to have a fever with a T-max of 101.4 F, he was tachycardic and tachypneic as well.   Labs show sodium 133, calcium 9.9, chloride 99, bicarbonate 20, glucose 240, BUN 35, creatinine 1.84, calcium 10, anion gap 15, alkaline phosphatase 115, albumin 4.2, lipase 22, AST 901, ALT 1275, total protein 7.6, bilirubin 3.6 troponin 8, lactic acid 3.1, white count 10.9, hemoglobin 15.9 hematocrit 47.3, MCV 82, RDW 13.8, platelet count 212, PT 11.9, INR 0.9  Respiratory viral panel is negative  Gallbladder ultrasound shows gallbladder sludge with findings that may represent sequelae of acalculus cholecystitis  CT scan of abdomen and pelvis showed mild haziness of the gallbladder wall and pericholecystic fat. No calcified gallstones. Ultrasound may provide better evaluation of the gallbladder if there is a concern for acute cholecystitis. 5.6 x 5.9 heterogeneously enhancing mass in the upper pole of the left kidney most concerning for malignancy.  CT angiogram of the chest is negative for pulmonary embolism  Chest x-ray reviewed by me shows clear lungs  Twelve-lead EKG reviewed by me shows sinus tachycardia with a left posterior fascicular block "   Assessment  / Plan / Recommendation Clinical Impression  Bedside swallow eval revealed mild oral pharyngeal dysphagia. Pt was recently extubated yesterday after by days of being on the ventilator s/p surgery. Today, Pt was alert and slightly agitated that he was not receiving food or drinks. Oral mech exam revealed structures to be functioning adequately. Pt did have some difficulty following 2 step or more complex directions. Pt tolerated thin liquids by cup and by straw without s/s of aspiration but needed cues to slow down and take smaller sips. Pt swallowed applesauce without difficulty but reported he did not like applesauce. Pt self fed a piece of graham cracker. Slow oral transit and noted coughing after the swallow. Mild to moderate oral residue after the swallow which Pt. cleared with f/u swallows of water. Pt reported the graham cracker was dry and "went down wrong". Pt's wife entered the room upon completion of swallowing exam and reported that Pt occasionally "gets choked" on foods at home, since his neck surgery. Rec full liquid diet at this time. Will f/u with toleration of diet and advance to soft solids when appropriate. May also need ST full  Cog/Language eval at discharge if not back to baseline. Speech was clear and appropriate. SLP Visit Diagnosis: Dysphagia, oropharyngeal phase (R13.12)    Aspiration Risk  Mild aspiration risk    Diet Recommendation Other (Comment) (Full liquid)   Liquid Administration via: Cup;Straw Medication Administration: Whole meds with puree Compensations: Minimize environmental distractions;Slow rate;Small sips/bites Postural Changes: Seated upright at 90 degrees;Remain upright for at least 30 minutes after po intake    Other  Recommendations   N/A  Follow up Recommendations   ST to follow up with toleration of diet     Frequency and Duration min 2x/week  1 week       Prognosis Prognosis for Safe Diet Advancement: Good      Swallow Study   General Date  of Onset: 02/09/20 HPI: Per admitting H&P "Kenneth Osborn is a 64 y.o. 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  Pain is mostly across his chest with radiation to the right arm associated with fever, chills and fatigue.  He complains of nausea but denies having any abdominal pain.  Patient felt that he may have infection with coronavirus but denies having any known exposure.  He denies having any diaphoresis, no palpitations, no shortness of breath but he has felt dizzy and lightheaded.  He denies having any diarrhea, no constipation, no melena stools, no hematochezia, no urinary frequency, no nocturia, no cough, no headache.  He has chronic low back pain but has had multiple back surgeries and states that there has not been a change in the intensity of his pain.  In the ER he was noted to have a fever with a T-max of 101.4 F, he was tachycardic and tachypneic as well.   Labs show sodium 133, calcium 9.9, chloride 99, bicarbonate 20, glucose 240, BUN 35, creatinine 1.84, calcium 10, anion gap 15, alkaline phosphatase 115, albumin 4.2, lipase 22, AST 901, ALT 1275, total protein 7.6, bilirubin 3.6 troponin 8, lactic acid 3.1, white count 10.9, hemoglobin 15.9 hematocrit 47.3, MCV 82, RDW 13.8, platelet count 212, PT 11.9, INR 0.9  Respiratory viral panel is negative  Gallbladder ultrasound shows gallbladder sludge with findings that may represent sequelae of acalculus cholecystitis  CT scan of abdomen and pelvis showed mild haziness of the gallbladder wall and pericholecystic fat. No calcified gallstones. Ultrasound may provide better evaluation of the gallbladder if there is a concern for acute cholecystitis. 5.6 x 5.9 heterogeneously enhancing mass in the upper  pole of the left kidney most concerning for malignancy.  CT angiogram of the chest is negative for pulmonary embolism  Chest x-ray reviewed by me shows clear lungs  Twelve-lead EKG reviewed by me shows sinus tachycardia with a left posterior fascicular block " Type of Study: Bedside Swallow Evaluation Diet Prior to this Study: Other (Comment) (Full liquid) Temperature Spikes Noted: No Respiratory Status: Nasal cannula History of Recent Intubation: Yes Length of Intubations (days): 5 days Date extubated: 02/13/20 Behavior/Cognition: Alert;Cooperative Oral Cavity Assessment: Within Functional Limits Oral Cavity - Dentition: Adequate natural dentition Vision: Functional for self-feeding Self-Feeding Abilities: Needs assist;Needs set up Patient Positioning: Upright in bed Baseline Vocal Quality: Normal;Hoarse Volitional Cough: Strong    Oral/Motor/Sensory Function Overall Oral Motor/Sensory Function: Within functional limits   Ice Chips Ice chips: Within functional limits Presentation: Spoon  Thin Liquid Thin Liquid: Within functional limits Presentation: Cup;Spoon;Straw    Nectar Thick Nectar Thick Liquid: Not tested   Honey Thick Honey Thick Liquid: Not tested   Puree Puree: Within functional limits Presentation: Spoon   Solid     Solid: Impaired Presentation: Self Fed Pharyngeal Phase Impairments: Cough - Immediate;Other (comments) (Immediate coughing after swallowing the graham cracker)      Lucila Maine 02/14/2020,9:51 AM

## 2020-02-14 NOTE — Progress Notes (Signed)
Patient transported by bed to 135. See CHL for further update

## 2020-02-14 NOTE — Progress Notes (Signed)
Nutrition Follow Up Note   DOCUMENTATION CODES:   Obesity unspecified  INTERVENTION:   Ensure Max protein supplement BID, each supplement provides 150kcal and 30g of protein.  MVI daily   NUTRITION DIAGNOSIS:   Inadequate oral intake related to inability to eat (pt sedated and ventilated) as evidenced by NPO status. - resolving   GOAL:   Patient will meet greater than or equal to 90% of their needs  MONITOR:   PO intake,Supplement acceptance,Labs,Weight trends,Skin,I & O's  ASSESSMENT:   64 y.o. male with medical history significant for coronary artery disease status post stent angioplasty, COPD, chronic low back pain, GERD, hypertension, diabetes mellitus with complications of stage III kidney disease and Mnire's disease who presented to the emergency room for evaluation of chest pain and was found to have acute acalculous cholecystitis s/p IR drain 81/77 complicated by septic encephalopathy requiring intubation and ventilation  Pt extubated 12/26. Pt seen by SLP today and placed on a full liquid diet. RD will add supplements and MVI to help pt meet his estimated needs. Pt continues to have some confusion. Drain in place with 115m output. No new weight since admit; will request weekly weights.   Medications reviewed and include: lovenox, insulin, protonix, zosyn, KCl  Labs reviewed: K 3.2(L), BUN 30(H), P 2.6 wnl, Mg 2.0 wnl cbgs- 198, 222 x 24 hrs  Diet Order:   Diet Order            Diet full liquid Room service appropriate? Yes; Fluid consistency: Thin  Diet effective now                EDUCATION NEEDS:   No education needs have been identified at this time  Skin:  Skin Assessment: Reviewed RN Assessment  Last BM:  12/26- type 6  Height:   Ht Readings from Last 1 Encounters:  02/07/20 5' 9"  (1.753 m)    Weight:   Wt Readings from Last 1 Encounters:  02/07/20 102.1 kg    Ideal Body Weight:  72.7 kg  BMI:  Body mass index is 33.23  kg/m.  Estimated Nutritional Needs:   Kcal:  2000-2300kcal/day  Protein:  100-115g/day  Fluid:  2.1-2.4L/day  CKoleen DistanceMS, RD, LDN Please refer to [facility]for RD and/or RD on-call/weekend/after hours pager

## 2020-02-14 NOTE — Progress Notes (Signed)
1755 Report called Tam on 1 A but room is dirty and must be cleaned. Foley and cholecystomy drain and foley have been emptied. Patient is happy about move to the floor.

## 2020-02-14 NOTE — Progress Notes (Signed)
Inpatient Diabetes Program Recommendations  AACE/ADA: New Consensus Statement on Inpatient Glycemic Control (2015)  Target Ranges:  Prepandial:   less than 140 mg/dL      Peak postprandial:   less than 180 mg/dL (1-2 hours)      Critically ill patients:  140 - 180 mg/dL   Results for ABIMELEC, GROCHOWSKI" (MRN [id number]) as of 02/14/2020 09:58  Ref. Range 02/12/2020 23:28 02/13/2020 03:29 02/13/2020 07:35 02/13/2020 11:34 02/13/2020 16:09 02/13/2020 19:49  Glucose-Capillary Latest Ref Range: 70 - 99 mg/dL 304 (H)  15 units NOVOLOG  266 (H)  11 units NOVOLOG  337 (H)  15 units NOVOLOG  10 units LANTUS  306 (H)  15 units NOVOLOG  252 (H) 250 (H)  7 units NOVOLOG  10 units LANTUS    Results for JAVIUS, SYLLA" (MRN [id number]) as of 02/14/2020 09:58  Ref. Range 02/13/2020 23:58 02/14/2020 04:03 02/14/2020 08:21  Glucose-Capillary Latest Ref Range: 70 - 99 mg/dL 216 (H)  7 units NOVOLOG  198 (H)  4 units NOVOLOG  222 (H)  7 units NOVOLOG    Home DM Meds: Concentrated U500 Regular Insulin                             80 units with Breakfast/ 80 units with Lunch/ 70 units with Dinner                             Metformin 1000 mg BID  Current Orders: Lantus 10 units BID      Novolog 0-20 units Q4H   Endocrinologist: Dr. Honor Junes with [facility]--last seen 11/11/2019--Was told to change his U500 Insulin to the following:  90 units with Breakfast/ 70 units with Lunch/ 65 units with Dinner    MD- Note that patient takes Concentrated U500 Regular Insulin at home.  This insulin is 5x as concentrated as regular U100 insulin.  Allowed PO Full liquid diet this AM.  CBGs >200  Please consider Increasing Lantus to 15 units BID   --Will follow patient during hospitalization--  Wyn Quaker RN, MSN, CDE Diabetes Coordinator Inpatient Glycemic Control Team Team Pager: [phone] (8a-5p)

## 2020-02-14 NOTE — Progress Notes (Addendum)
CRITICAL CARE NOTE    Name: Kenneth Osborn MRN: [id number] DOB: 10-23-1955     LOS: 7     SUBJECTIVE FINDINGS & SIGNIFICANT EVENTS    Patient description:   64 yo male with PMH of coronary artery disease status post stent angioplasty, COPD, chronic low back pain, GERD, hypertension, diabetes mellitus with complications of stage III kidney disease and Mnire's disease who presents to the emergency room for evaluation of chest pain.  He reported in ED that pain is mostly across his chest with radiation to the right arm associated with fever, chills and fatigue.  He complained of nausea but denies having any abdominal pain.  Patient felt that he may have infection with coronavirus but denies having any known exposure.  He denied having any diaphoresis, no palpitations, no shortness of breath but he has felt dizzy and lightheaded.  He denied having any diarrhea, no constipation, no melena stools, no hematochezia, no urinary frequency, no nocturia, no cough, no headache.  He has chronic low back pain but has had multiple back surgeries and stated that there has not been a change in the intensity of his pain.  In the ER he was noted to have a fever with a T-max of 101.4 F, he was tachycardic and tachypneic as well.  Labs show sodium 133, calcium 9.9, chloride 99, bicarbonate 20, glucose 240, BUN 35, creatinine 1.84, calcium 10, anion gap 15, alkaline phosphatase 115, albumin 4.2, lipase 22, AST 901, ALT 1275, total protein 7.6, bilirubin 3.6 troponin 8, lactic acid 3.1, white count 10.9, hemoglobin 15.9 hematocrit 47.3, MCV 82, RDW 13.8, platelet count 212, PT 11.9, INR 0.9  Respiratory viral panel is negative  Gallbladder ultrasound shows gallbladder sludge with findings that may represent sequelae of acalculus cholecystitis CT  scan of abdomen and pelvis showed mild haziness of the gallbladder wall and pericholecystic fat. No calcified gallstones. Ultrasound may provide better evaluation of the gallbladder if there is a concern for acute cholecystitis. 5.6 x 5.9 heterogeneously enhancing mass in the upper pole of the left kidney most concerning for malignancy.  CT angiogram of the chest is negative for pulmonary embolism  Chest x-ray reviewed by me shows clear lungs  Twelve-lead EKG reviewed by me shows sinus tachycardia with a left posterior fascicular block  Patient is a 64 year old patient male who presents to the emergency room for evaluation of chest pain was noted to be febrile, tachycardic and tachypneic with an elevated lactic acid level. White count is slightly elevated at 10.9 and source of sepsis appears to be secondary to acute acalculous cholecystitis.  He will be admitted to the hospital for further evaluation.    Patient was in PACU for cholecystostomy tube placement but continued to deteriorate and required increased O2 as well as decreasing mentation.  During my evaluation patients wife was present and was able to give additional details.  Patient is unresponsive with severe acute hypoxemic respiratory failure and in circulatory shock.   PCCM consultation for likely septic shock with multiorgan failure in acutely comatose state.     02/10/20- patient on MV RASS-1. Awakening trial following verbal communication. RUQ drain with less blood more serosang.  Consulted urology for L renal mass - spoke with Kenneth Osborn.  Patient seen by surgery today - spoke with Kenneth Osborn.    02/11/20- patient is on 40% FiO2. Spoke with wife and reviewed care plan and answered questions.   02/12/20-  Patient without overnight events.  Plan for SBT.  reviewed with wife at bedside.     02/13/20- patient for SBT today.  Spoke with wife at bedside. Reviewed care plan with surgeon Kenneth Kenneth Osborn.   02/14/20-patient appearing well out of  shocklike state tolerating extubation well states he feels good.  Boarded for the floor.  Hospitalist note that they will take over on 02-15-20   Lines/tubes : Biliary Tube RUQ (Active)    Microbiology/Sepsis markers: Results for orders placed or performed during the hospital encounter of 02/07/20  Resp Panel by RT-PCR (Flu A&B, Covid) Nasopharyngeal Swab     Status: None   Collection Time: 02/07/20  5:46 PM   Specimen: Nasopharyngeal Swab; Nasopharyngeal(NP) swabs in vial transport medium  Result Value Ref Range Status   SARS Coronavirus 2 by RT PCR NEGATIVE NEGATIVE Final    Comment: (NOTE) SARS-CoV-2 target nucleic acids are NOT DETECTED.  The SARS-CoV-2 RNA is generally detectable in upper respiratory specimens during the acute phase of infection. The lowest concentration of SARS-CoV-2 viral copies this assay can detect is 138 copies/mL. A negative result does not preclude SARS-Cov-2 infection and should not be used as the sole basis for treatment or other patient management decisions. A negative result may occur with  improper specimen collection/handling, submission of specimen other than nasopharyngeal swab, presence of viral mutation(s) within the areas targeted by this assay, and inadequate number of viral copies(<138 copies/mL). A negative result must be combined with clinical observations, patient history, and epidemiological information. The expected result is Negative.  Fact Sheet for Patients:  EntrepreneurPulse.com.au  Fact Sheet for Healthcare Providers:  IncredibleEmployment.be  This test is no t yet approved or cleared by the [place] FDA and  has been authorized for detection and/or diagnosis of SARS-CoV-2 by FDA under an Emergency Use Authorization (EUA). This EUA will remain  in effect (meaning this test can be used) for the duration of the COVID-19 declaration under Section 564(b)(1) of the Act, 21 U.S.C.section  360bbb-3(b)(1), unless the authorization is terminated  or revoked sooner.       Influenza A by PCR NEGATIVE NEGATIVE Final   Influenza B by PCR NEGATIVE NEGATIVE Final    Comment: (NOTE) The Xpert Xpress SARS-CoV-2/FLU/RSV plus assay is intended as an aid in the diagnosis of influenza from Nasopharyngeal swab specimens and should not be used as a sole basis for treatment. Nasal washings and aspirates are unacceptable for Xpert Xpress SARS-CoV-2/FLU/RSV testing.  Fact Sheet for Patients: EntrepreneurPulse.com.au  Fact Sheet for Healthcare Providers: IncredibleEmployment.be  This test is not yet approved or cleared by the [place] FDA and has been authorized for detection and/or diagnosis of SARS-CoV-2 by FDA under an Emergency Use Authorization (EUA). This EUA will remain in effect (meaning this test can be used) for the duration of the COVID-19 declaration under Section 564(b)(1) of the Act, 21 U.S.C. section 360bbb-3(b)(1), unless the authorization is terminated or revoked.  Performed at [facility], [address] [place], [place] [address]   Culture, blood (Routine X 2) w Reflex to ID Panel     Status: None   Collection Time: 02/07/20  5:46 PM   Specimen: BLOOD  Result Value Ref Range Status   Specimen Description BLOOD BLOOD RIGHT FOREARM  Final   Special Requests   Final    BOTTLES DRAWN AEROBIC AND ANAEROBIC Blood Culture adequate volume   Culture   Final    NO GROWTH 5 DAYS Performed at [facility], [address] [place], [place] [address]  Report Status 02/12/2020 FINAL  Final  Culture, blood (Routine X 2) w Reflex to ID Panel     Status: None   Collection Time: 02/07/20  5:46 PM   Specimen: BLOOD  Result Value Ref Range Status   Specimen Description BLOOD LEFT ANTECUBITAL  Final   Special Requests   Final    BOTTLES DRAWN AEROBIC AND ANAEROBIC Blood Culture adequate volume   Culture   Final     NO GROWTH 5 DAYS Performed at [facility], [address] [place], [place] [address]    Report Status 02/12/2020 FINAL  Final  Urine Culture     Status: Abnormal   Collection Time: 02/07/20  5:46 PM   Specimen: Urine, Random  Result Value Ref Range Status   Specimen Description   Final    URINE, RANDOM Performed at [facility], [address], [place], [place] [address]    Special Requests   Final    NONE Performed at [facility], [address], [place], [place] [address]    Culture (A)  Final    <10,000 COLONIES/mL INSIGNIFICANT GROWTH Performed at [facility], [place] [address] [place], [place] [address]    Report Status 02/08/2020 FINAL  Final  Aerobic/Anaerobic Culture (surgical/deep wound)     Status: None   Collection Time: 02/09/20  3:00 PM   Specimen: Gallbladder; Abscess  Result Value Ref Range Status   Specimen Description   Final    GALL BLADDER Performed at [facility], [address], [place], [place] [address]    Special Requests   Final    NONE Performed at [facility], [address] [place], [place] [address]    Gram Stain   Final    ABUNDANT WBC PRESENT, PREDOMINANTLY PMN MODERATE GRAM NEGATIVE RODS    Culture   Final    FEW KLEBSIELLA PNEUMONIAE NO ANAEROBES ISOLATED Performed at [facility], [place] [address], [place], [place] [address]    Report Status 02/14/2020 FINAL  Final   Organism ID, Bacteria KLEBSIELLA PNEUMONIAE  Final      Susceptibility   Klebsiella pneumoniae - MIC*    AMPICILLIN >=32 RESISTANT Resistant     CEFAZOLIN <=4 SENSITIVE Sensitive     CEFEPIME <=0.12 SENSITIVE Sensitive     CEFTAZIDIME <=1 SENSITIVE Sensitive     CEFTRIAXONE <=0.25 SENSITIVE Sensitive     CIPROFLOXACIN <=0.25 SENSITIVE Sensitive     GENTAMICIN <=1 SENSITIVE Sensitive     IMIPENEM <=0.25 SENSITIVE Sensitive     TRIMETH/SULFA <=20 SENSITIVE Sensitive     AMPICILLIN/SULBACTAM 4  SENSITIVE Sensitive     PIP/TAZO <=4 SENSITIVE Sensitive     * FEW KLEBSIELLA PNEUMONIAE  MRSA PCR Screening     Status: None   Collection Time: 02/10/20 12:00 AM   Specimen: Nasal Mucosa; Nasopharyngeal  Result Value Ref Range Status   MRSA by PCR NEGATIVE NEGATIVE Final    Comment:        The GeneXpert MRSA Assay (FDA approved for NASAL specimens only), is one component of a comprehensive MRSA colonization surveillance program. It is not intended to diagnose MRSA infection nor to guide or monitor treatment for MRSA infections. Performed at [facility], [address], [place], [place] [address]     Anti-infectives:  Anti-infectives (From admission, onward)   Start     Dose/Rate Route Frequency Ordered Stop   02/08/20 1800  cefTRIAXone (ROCEPHIN) 2 g in sodium chloride  0.9 % 100 mL IVPB  Status:  Discontinued        2 g 200 mL/hr over 30 Minutes Intravenous Every 24 hours 02/07/20 2230 02/07/20 2300   02/07/20 2330  piperacillin-tazobactam (ZOSYN) IVPB 3.375 g        3.375 g 12.5 mL/hr over 240 Minutes Intravenous Every 8 hours 02/07/20 2230 02/16/20 2359   02/07/20 2230  metroNIDAZOLE (FLAGYL) IVPB 500 mg  Status:  Discontinued        500 mg 100 mL/hr over 60 Minutes Intravenous  Once 02/07/20 2223 02/07/20 2259   02/07/20 1930  cefTRIAXone (ROCEPHIN) 2 g in sodium chloride 0.9 % 100 mL IVPB        2 g 200 mL/hr over 30 Minutes Intravenous  Once 02/07/20 1918 02/07/20 2101       Consults: Treatment Team:  Ronny Bacon, MD Abbie Sons, MD     PAST MEDICAL HISTORY   Past Medical History:  Diagnosis Date  . Acute postoperative pain 12/03/2018  . Allergic rhinitis 12/30/2012  . Anginal pain ([facility])   . Bronchitis    hx of  . Can't get food down 08/12/2014  . Chronic back pain    thoracic area  . Concussion 09/2015  . COPD (chronic obstructive pulmonary disease) ([place])   . Coronary artery disease    99% blockage  . DDD (degenerative disc  disease), cervical   . Dehydration symptoms    2019  . 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 Dysphagia   . GERD (gastroesophageal reflux disease)   . History of Meniere's disease 12/21/2014  . History of thoracic spine surgery (S/P T9-10 IVD spacer) 12/21/2014  . Hypercholesteremia   . Hyperlipidemia   . Hypertension    sees Kenneth. Jenny Reichmann walker Jefm Bryant  . 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 Myocardial infarction [facility])    Sees Kenneth. Drema [place], [place]  . Neuromuscular disorder ([facility])    diabetic neuropathy in feet  . Psychosis ([facility])   . Short-segment Barrett's esophagus   . Sleep apnea    uses cpap and 2 L o2 at hs     SURGICAL HISTORY   Past Surgical History:  Procedure Laterality Date  . APPENDECTOMY    . ARTHRODESIS ANTERIOR ANTERIOR CERVICLE SPINE  01/04/2013  . BACK SURGERY     fusion thoracic area  . CARDIAC CATHETERIZATION     may 2012 and Nov 20, 2010  . CARDIAC CATHETERIZATION N/A 06/15/2015   Procedure: Left Heart Cath and Coronary Angiography;  Surgeon: Corey Skains, MD;  Location: [place] CV LAB;  Service: Cardiovascular;  Laterality: N/A;  . CARDIAC CATHETERIZATION N/A 06/15/2015   Procedure: Coronary Stent Intervention;  Surgeon: Isaias Cowman, MD;  Location: [place] CV LAB;  Service: Cardiovascular;  Laterality: N/A;  . CARDIOVASCULAR STRESS TEST     jan 2014  . COLONOSCOPY WITH PROPOFOL N/A 09/19/2014   Procedure: COLONOSCOPY WITH PROPOFOL;  Surgeon: Manya Silvas, MD;  Location: [facility] ENDOSCOPY;  Service: Endoscopy;  Laterality: N/A;  . CORONARY ANGIOPLASTY     stent placement  . ESOPHAGOGASTRODUODENOSCOPY N/A 09/19/2014   Procedure: ESOPHAGOGASTRODUODENOSCOPY (EGD);  Surgeon: Manya Silvas, MD;  Location: [facility] ENDOSCOPY;  Service: Endoscopy;  Laterality: N/A;  . IR PERC CHOLECYSTOSTOMY  02/09/2020  . [facility] right ear  . LUMBAR SPINAL CORD SIMULATOR LEAD REMOVAL  Right 08/09/2019   Procedure: REMOVAL SPINAL  CORD STIMULATOR PERCUTANEOUS LEADS, REMOVAL PULSE GENERATOR;  Surgeon: [name], MD;  Location: [facility] ORS;  Service: Neurosurgery;  Laterality: Right;  LOCAL WITH MAC  . mastoid shunt     left, 2002, 1997 right ear, 1980 right ear  . PULSE GENERATOR IMPLANT N/A 01/18/2019   Procedure: MEDTRONIC SPINAL CORD STIMULATOR BATTERY EXCHANGE;  Surgeon: [name], MD;  Location: [facility] ORS;  Service: Neurosurgery;  Laterality: N/A;  . SAVORY DILATION N/A 09/19/2014   Procedure: SAVORY DILATION;  Surgeon: Manya Silvas, MD;  Location: [facility] ENDOSCOPY;  Service: Endoscopy;  Laterality: N/A;  . SHOULDER ARTHROSCOPY WITH SUBACROMIAL DECOMPRESSION Left 04/06/2012   Procedure: SHOULDER ARTHROSCOPY WITH SUBACROMIAL DECOMPRESSION;  Surgeon: Vickey Huger, MD;  Location: [place];  Service: Orthopedics;  Laterality: Left;  left shoulder arthroscopy, subacromial decompression and distal clavicle resection  . SPINAL CORD STIMULATOR IMPLANT Right      FAMILY HISTORY   Family History  Problem Relation Age of Onset  . Heart disease Mother   . Diabetes Mother   . Heart disease Father   . Cancer Sister   . Heart disease Maternal Aunt   . Heart disease Maternal Uncle   . Diabetes Maternal Grandmother   . Diabetes Paternal Grandmother      SOCIAL HISTORY   Social History   Tobacco Use  . Smoking status: Never Smoker  . Smokeless tobacco: Never Used  Vaping Use  . Vaping Use: Never used  Substance Use Topics  . Alcohol use: No  . Drug use: No     MEDICATIONS   Current Medication:  Current Facility-Administered Medications:  .  0.9 %  sodium chloride infusion, , Intravenous, PRN, Shawna Clamp, MD, Stopped at 02/10/20 [phone] .  acetaminophen (TYLENOL) 160 MG/5ML solution 1,000 mg, 1,000 mg, Oral, TID PRN, Rust-Chester, Toribio Harbour L, NP .  ALPRAZolam Duanne Moron) tablet 0.5 mg, 0.5 mg, Oral, QHS, Dallie Piles, RPH .  Chlorhexidine Gluconate Cloth 2 % PADS 6  each, 6 each, Topical, Daily, Awilda Bill, NP, 6 each at 02/13/20 2145 .  cloNIDine (CATAPRES - Dosed in mg/24 hr) patch 0.2 mg, 0.2 mg, Transdermal, Weekly, Lanney Gins, Fuad, MD, 0.2 mg at 02/13/20 1203 .  enoxaparin (LOVENOX) injection 50 mg, 0.5 mg/kg, Subcutaneous, Q24H, Enzo Bi, MD, 50 mg at 02/13/20 2146 .  hydrALAZINE (APRESOLINE) injection 10 mg, 10 mg, Intravenous, Q6H PRN, Rust-Chester, Britton L, NP .  insulin aspart (novoLOG) injection 0-20 Units, 0-20 Units, Subcutaneous, Q4H, Rust-Chester, Britton L, NP, 11 Units at 02/14/20 1625 .  insulin glargine (LANTUS) injection 15 Units, 15 Units, Subcutaneous, BID, Dallie Piles, RPH .  [START ON 02/15/2020] multivitamin with minerals tablet 1 tablet, 1 tablet, Oral, Daily, Tyna Jaksch, MD .  nitroGLYCERIN (NITROSTAT) SL tablet 0.4 mg, 0.4 mg, Sublingual, Q5 min PRN, Agbata, Tochukwu, MD .  ondansetron (ZOFRAN) tablet 4 mg, 4 mg, Oral, Q6H PRN **OR** ondansetron (ZOFRAN) injection 4 mg, 4 mg, Intravenous, Q6H PRN, Rust-Chester, Britton L, NP .  pantoprazole (PROTONIX) injection 40 mg, 40 mg, Intravenous, Q12H, Rust-Chester, Britton L, NP, 40 mg at 02/14/20 1348 .  piperacillin-tazobactam (ZOSYN) IVPB 3.375 g, 3.375 g, Intravenous, Q8H, Tyna Jaksch, MD, Last Rate: 12.5 mL/hr at 02/14/20 1517, 3.375 g at 02/14/20 1517 .  protein supplement (ENSURE MAX) liquid, 11 oz, Oral, BID, Tyna Jaksch, MD, 11 oz at 02/14/20 1516    ALLERGIES   Patient has no known allergies.    REVIEW OF SYSTEMS     Unable  to obtain patient is unresponsive  PHYSICAL EXAMINATION   Vital Signs: Temp:  [98.3 F (36.8 C)-98.8 F (37.1 C)] 98.7 F (37.1 C) (12/27 1600) Pulse Rate:  [64-91] 86 (12/27 1500) Resp:  [13-28] 27 (12/27 1600) BP: (132-189)/(61-155) 153/88 (12/27 1600) SpO2:  [95 %-100 %] 96 % (12/27 1600)  GENERAL:age appropriate HEAD: Normocephalic, atraumatic.  EYES: Pupils equal, round, reactive to light.  No  scleral icterus.  MOUTH: Moist mucosal membrane. NECK: Supple. No thyromegaly. No nodules. No JVD.  PULMONARY: decreased air entry bilateraly  CARDIOVASCULAR: S1 and S2. Regular rate and rhythm. No murmurs, rubs, or gallops.  GASTROINTESTINAL: Soft, nontender, non-distended. No masses. Positive bowel sounds. No hepatosplenomegaly.  MUSCULOSKELETAL: No swelling, clubbing, or edema.  NEUROLOGIC: encephalopathic GCS7 SKIN:intact,warm,dry   PERTINENT DATA     Infusions: . sodium chloride Stopped (02/10/20 0904)  . piperacillin-tazobactam (ZOSYN)  IV 3.375 g (02/14/20 1517)   Scheduled Medications: . ALPRAZolam  0.5 mg Oral QHS  . Chlorhexidine Gluconate Cloth  6 each Topical Daily  . cloNIDine  0.2 mg Transdermal Weekly  . enoxaparin (LOVENOX) injection  0.5 mg/kg Subcutaneous Q24H  . insulin aspart  0-20 Units Subcutaneous Q4H  . insulin glargine  15 Units Subcutaneous BID  . [START ON 02/15/2020] multivitamin with minerals  1 tablet Oral Daily  . pantoprazole (PROTONIX) IV  40 mg Intravenous Q12H  . Ensure Max Protein  11 oz Oral BID   PRN Medications: sodium chloride, acetaminophen (TYLENOL) oral liquid 160 mg/5 mL, hydrALAZINE, nitroGLYCERIN, ondansetron **OR** ondansetron (ZOFRAN) IV Hemodynamic parameters:   Intake/Output: 12/26 0701 - 12/27 0700 In: 478.4 [I.V.:242.9; IV Piggyback:235.5] Out: 1610 [Urine:2375; Drains:120]  Ventilator  Settings:      LAB RESULTS:  Basic Metabolic Panel: Recent Labs  Lab 02/10/20 0548 02/11/20 0423 02/12/20 0529 02/13/20 0528 02/14/20 0425  NA 138 139 140 142 144  K 4.3 3.6 3.6 4.0 3.2*  CL 103 105 104 105 105  CO2 _0 GLUCOSE 263* 262* 261* 309* 218*  BUN 20 27* 25* 28* 30*  CREATININE 1.21 1.02 0.88 0.94 0.85  CALCIUM 9.2 8.9 9.3 9.5 9.9  MG 1.9 1.9 1.6* 1.8 2.0  PHOS  --   --  3.3 2.9 2.6   Liver Function Tests: Recent Labs  Lab 02/08/20 0424 02/09/20 0604 02/10/20 0548 02/11/20 0423 02/12/20 0529   AST 359* 213* 125* 42*  --   ALT 731* 488* 348* 222*  --   ALKPHOS 128* 198* 231* 177*  --   BILITOT 4.5* 4.0* 2.3* 1.2  --   PROT 6.1* 6.5 5.9* 6.1*  --   ALBUMIN 3.1* 3.1* 2.6* 2.5* 2.3*   No results for input(s): LIPASE, AMYLASE in the last 168 hours. Recent Labs  Lab 02/07/20 2300  AMMONIA 24   CBC: Recent Labs  Lab 02/10/20 0548 02/11/20 0423 02/12/20 0529 02/13/20 0528 02/14/20 0425  WBC 7.1 6.6 7.0 7.5 10.2  NEUTROABS  --   --  4.1  --   --   HGB 12.1* 11.4* 11.7* 13.2 13.5  HCT 36.1* 34.2* 35.7* 39.3 40.4  MCV 82.8 83.6 83.6 82.0 82.6  PLT 168 179 221 261 319   Cardiac Enzymes: No results for input(s): CKTOTAL, CKMB, CKMBINDEX, TROPONINI in the last 168 hours. BNP: Invalid input(s): POCBNP CBG: Recent Labs  Lab 02/13/20 2358 02/14/20 0403 02/14/20 0821 02/14/20 1138 02/14/20 1619  GLUCAP 216* 198* 222* 236* 275*       IMAGING RESULTS:  Imaging: DG Chest Port 1 View  Result Date: 02/13/2020 CLINICAL DATA:  Ventilator dependence. EXAM: PORTABLE CHEST 1 VIEW COMPARISON:  02/09/2020 FINDINGS: 0416 hours. Endotracheal tube tip is 4.3 cm above the base of the carina. The NG tube passes into the stomach although the distal tip position is not included on the film. Esophageal temperature probe evident. Low lung volumes with basilar atelectasis/infiltrate, minimally progressed in the interval. Telemetry leads overlie the chest. IMPRESSION: 1. Interval progression of bibasilar atelectasis/infiltrate. 2. ETT tip is 4.3 cm above the base of the carina. Electronically Signed   By: Misty Stanley M.D.   On: 02/13/2020 04:53   _0 @ No results found.    ASSESSMENT AND PLAN    -Multidisciplinary rounds held today    Acutely comatose state    - likely due to septic encephalopathy     - discussed with wife at bedside      - patient will need to have airway secured and treat underlying cause     - CT head when more stable -correct  electrolytes -empiric abx -lactate  -procalcitonin -neurology evaluation when sepsis treated    Acute cholecystitis     -surgery on case -continue to follow-up recommendations  -s/p cholecystostomy tube - frank pus and some serosang active draining  - empiric Zosyn IV until 02-16-20 .  At which point can consider transitioning to Unasyn for 14-day course as patient grew Klebsiella that would be sensitive that this versus discontinuation altogether -ICU monitoring    Left renal mass  - Urology on case - Kenneth Osborn - appreciate input  Acute Renal Failure-stage 2 most likely due to septic nephropathy  resolving -follow chem 7 -follow UO -continue Foley Catheter-assess need daily   Transaminitis In context of sepsis acute cholecysititis and shock physiology  - GI on case - appreciate input - workup in progress    - hepatitis panel negative  - intubated and sedated - minimal sedation to achieve a RASS goal: -1 Wake up assessment pending   Sepsis    -present on admission    - due to intraabdominal infection - acute cholecystitis -use vasopressors to keep MAP>65 -follow ABG and LA -follow up cultures -emperic ABX -consider stress dose steroids   ID -continue IV abx as prescibed -follow up cultures  GI/Nutrition GI PROPHYLAXIS as indicated DIET-->TF's as tolerated Constipation protocol as indicated  ENDO - ICU hypoglycemic\Hyperglycemia protocol -check FSBS per protocol   ELECTROLYTES -follow labs as needed -replace as needed -pharmacy consultation   DVT/GI PRX ordered -SCDs  TRANSFUSIONS AS NEEDED MONITOR FSBS ASSESS the need for LABS as needed   Critical care provider statement:    Critical care time (minutes):  35   Critical care time was exclusive of:  Separately billable procedures and treating other patients   Critical care was necessary to treat or prevent imminent or life-threatening deterioration of the following conditions:  acute  cholecystitis, acutely comatose, encephalopathy, sepsis, transaminits, multiple comorbid conditions   Critical care was time spent personally by me on the following activities:  Development of treatment plan with patient or surrogate, discussions with consultants, evaluation of patient's response to treatment, examination of patient, obtaining history from patient or surrogate, ordering and performing treatments and interventions, ordering and review of laboratory studies and re-evaluation of patient's condition.  I assumed direction of critical care for this patient from another provider in my specialty: no    This document was prepared using Dragon voice recognition software and may include unintentional  dictation errors.    Newell Coral DO Internal Medicine/Pediatrics Pulmonary and Critical Care Fellow PGY-7

## 2020-02-14 NOTE — Progress Notes (Addendum)
[facility] SURGICAL PROGRESS NOTE (cpt [phone])  Hospital Day(s): 7.   Interval History: Patient seen and examined. Overnight, extubated to nasal canula. Patient alert this morning, no complaints of abdominal pain. He is anxious to eat. No fever, chills, nausea, emesis. Leukocytosis remains resolved at 10.2k. Renal function remains normal at sCr - 0.85, UO - 2.3L. Mild hypokalemia to 3.2. No electrolyte derangements. Cholecystostomy tube output with 120 ccs out; serosanguinous. Cx growing klebsiella pneumoniae which is sensitive to zosyn.   Review of Systems:  Constitutional: denies fever, chills  HEENT: denies cough or congestion  Respiratory: denies any shortness of breath  Cardiovascular: denies chest pain or palpitations  Gastrointestinal: denies abdominal pain, N/V, or diarrhea/and bowel function as per interval history Genitourinary: denies burning with urination or urinary frequency   Vital signs in last 24 hours: [min-max] current  Temp:  [98.3 F (36.8 C)-101.84 F (38.8 C)] 98.3 F (36.8 C) (12/27 0400) Pulse Rate:  [60-102] 81 (12/26 2300) Resp:  [13-30] 16 (12/27 0600) BP: (136-179)/(61-151) 166/74 (12/27 0600) SpO2:  [90 %-100 %] 95 % (12/26 2300) FiO2 (%):  [30 %] 30 % (12/26 1546)     Height: 5' 9"  (175.3 cm) Weight: 102.1 kg BMI (Calculated): 33.21   Intake/Output last 2 shifts:  12/26 0701 - 12/27 0700 In: 478.4 [I.V.:242.9; IV Piggyback:235.5] Out: 2495 [Urine:2375; Drains:120]   Physical Exam:  Constitutional: alert, cooperative and no distress  HENT: normocephalic without obvious abnormality  Eyes: PERRL, EOM's grossly intact and symmetric  Respiratory: breathing non-labored at rest, on [place] Cardiovascular: regular rate and sinus rhythm  Gastrointestinal: soft, non-tender, and non-distended, cholecystostomy tube in the RUQ, output serosanguinous Musculoskeletal: no edema or wounds, motor and sensation grossly intact, NT    Labs:  CBC Latest  Ref Rng & Units 02/14/2020 02/13/2020 02/12/2020  WBC 4.0 - 10.5 K/uL 10.2 7.5 7.0  Hemoglobin 13.0 - 17.0 g/dL 13.5 13.2 11.7(L)  Hematocrit 39.0 - 52.0 % 40.4 39.3 35.7(L)  Platelets 150 - 400 K/uL 319 261 221   CMP Latest Ref Rng & Units 02/14/2020 02/13/2020 02/12/2020  Glucose 70 - 99 mg/dL 218(H) 309(H) 261(H)  BUN 8 - 23 mg/dL 30(H) 28(H) 25(H)  Creatinine 0.61 - 1.24 mg/dL 0.85 0.94 0.88  Sodium 135 - 145 mmol/L 144 142 140  Potassium 3.5 - 5.1 mmol/L 3.2(L) 4.0 3.6  Chloride 98 - 111 mmol/L 105 105 104  CO2 22 - 32 mmol/L 29 27 25   Calcium 8.9 - 10.3 mg/dL 9.9 9.5 9.3  Total Protein 6.5 - 8.1 g/dL - - -  Total Bilirubin 0.3 - 1.2 mg/dL - - -  Alkaline Phos 38 - 126 U/L - - -  AST 15 - 41 U/L - - -  ALT 0 - 44 U/L - - -     Imaging studies: No new pertinent imaging studies   Assessment/Plan: (ICD-10's: K81.0) 64 y.o.malewith now resolved septicemia, likely transient, secondary to cholecystitis s/p percutaneous cholecystostomy tube placement (12/22),complicated by pertinent comorbidities includingsignificant cardiovascular disease, CAD s/p PCI   - Okay for diet from surgical standpoint; pending SLP evaluation   - Continue IV Abx (Zosyn)  - Continue cholecystotomy tube; monitor and record output   - Monitor abdominal examination; on-going bowel fucntion   - Pain control prn; antiemetics prn  - Mobilize as tolerated  - Further management per PCCM; appreciate their assistance   All of the above findings and recommendations were discussed with the patient, and the medical team, and all of  patient's questions were answered to his expressed satisfaction.  -- Edison Simon, PA-C [facility] 02/14/2020, 7:00 AM [phone] M-F: 7am - 4pm

## 2020-02-14 NOTE — Progress Notes (Signed)
[place] for Electrolyte Monitoring and Replacement   Recent Labs: Potassium (mmol/L)  Date Value  02/14/2020 3.2 (L)  02/24/2014 3.9   Magnesium (mg/dL)  Date Value  02/14/2020 2.0  05/14/2013 1.5 (L)   Calcium (mg/dL)  Date Value  02/14/2020 9.9   Calcium, Total (mg/dL)  Date Value  02/24/2014 8.4 (L)   Albumin (g/dL)  Date Value  02/12/2020 2.3 (L)  08/18/2013 3.8   Phosphorus (mg/dL)  Date Value  02/14/2020 2.6   Sodium (mmol/L)  Date Value  02/14/2020 144  02/24/2014 142     Assessment: 64 y.o. male with medical history significant for coronary artery disease status post stent angioplasty, COPD, chronic low back pain, GERD, hypertension, diabetes mellitus with complications of stage III kidney disease and Mnire's disease who presented to the emergency room for evaluation of chest pain and was found to have acute acalculous cholecystitis s/p IR drain 34/03 complicated by septic encephalopathy requiring intubation and ventilation, subsequently extubated on 12/26  Goal of Therapy:  Electrolytes WNL  Plan:   10 mEq IV KCl x 4 per NP  Re-check electrolytes in am  Dallie Piles ,PharmD Clinical Pharmacist 02/14/2020 7:23 AM

## 2020-02-15 DIAGNOSIS — K810 Acute cholecystitis: Secondary | ICD-10-CM | POA: Diagnosis not present

## 2020-02-15 DIAGNOSIS — E876 Hypokalemia: Secondary | ICD-10-CM

## 2020-02-15 DIAGNOSIS — A419 Sepsis, unspecified organism: Secondary | ICD-10-CM | POA: Diagnosis not present

## 2020-02-15 DIAGNOSIS — N2889 Other specified disorders of kidney and ureter: Secondary | ICD-10-CM | POA: Diagnosis not present

## 2020-02-15 DIAGNOSIS — R7989 Other specified abnormal findings of blood chemistry: Secondary | ICD-10-CM

## 2020-02-15 DIAGNOSIS — R6520 Severe sepsis without septic shock: Secondary | ICD-10-CM

## 2020-02-15 DIAGNOSIS — N179 Acute kidney failure, unspecified: Secondary | ICD-10-CM

## 2020-02-15 LAB — COMPREHENSIVE METABOLIC PANEL
ALT: 260 U/L — ABNORMAL HIGH (ref 0–44)
AST: 566 U/L — ABNORMAL HIGH (ref 15–41)
Albumin: 2.7 g/dL — ABNORMAL LOW (ref 3.5–5.0)
Alkaline Phosphatase: 334 U/L — ABNORMAL HIGH (ref 38–126)
Anion gap: 12 (ref 5–15)
BUN: 27 mg/dL — ABNORMAL HIGH (ref 8–23)
CO2: 24 mmol/L (ref 22–32)
Calcium: 9.8 mg/dL (ref 8.9–10.3)
Chloride: 107 mmol/L (ref 98–111)
Creatinine, Ser: 0.87 mg/dL (ref 0.61–1.24)
GFR, Estimated: >60 mL/min (ref >60)
Glucose, Bld: 237 mg/dL — ABNORMAL HIGH (ref 70–99)
Potassium: 3.3 mmol/L — ABNORMAL LOW (ref 3.5–5.1)
Sodium: 143 mmol/L (ref 135–145)
Total Bilirubin: 2.5 mg/dL — ABNORMAL HIGH (ref 0.3–1.2)
Total Protein: 6.6 g/dL (ref 6.5–8.1)

## 2020-02-15 LAB — GLUCOSE, CAPILLARY
Glucose-Capillary: 163 mg/dL — ABNORMAL HIGH (ref 70–99)
Glucose-Capillary: 196 mg/dL — ABNORMAL HIGH (ref 70–99)
Glucose-Capillary: 230 mg/dL — ABNORMAL HIGH (ref 70–99)
Glucose-Capillary: 235 mg/dL — ABNORMAL HIGH (ref 70–99)
Glucose-Capillary: 240 mg/dL — ABNORMAL HIGH (ref 70–99)
Glucose-Capillary: 251 mg/dL — ABNORMAL HIGH (ref 70–99)
Glucose-Capillary: 264 mg/dL — ABNORMAL HIGH (ref 70–99)
Glucose-Capillary: 281 mg/dL — ABNORMAL HIGH (ref 70–99)

## 2020-02-15 LAB — CBC
HCT: 39.4 % (ref 39.0–52.0)
Hemoglobin: 13 g/dL (ref 13.0–17.0)
MCH: 27.4 pg (ref 26.0–34.0)
MCHC: 33 g/dL (ref 30.0–36.0)
MCV: 83.1 fL (ref 80.0–100.0)
Platelets: 316 10*3/uL (ref 150–400)
RBC: 4.74 MIL/uL (ref 4.22–5.81)
RDW: 14.6 % (ref 11.5–15.5)
WBC: 9.4 10*3/uL (ref 4.0–10.5)
nRBC: 0 % (ref 0.0–0.2)

## 2020-02-15 LAB — MAGNESIUM: Magnesium: 1.9 mg/dL (ref 1.7–2.4)

## 2020-02-15 MED ORDER — PANTOPRAZOLE SODIUM 40 MG PO TBEC
40.0000 mg | DELAYED_RELEASE_TABLET | Freq: Two times a day (BID) | ORAL | Status: DC
  Administered 2020-02-15 – 2020-02-18 (×6): 40 mg via ORAL
  Filled 2020-02-15 (×6): qty 1

## 2020-02-15 MED ORDER — INSULIN ASPART 100 UNIT/ML ~~LOC~~ SOLN
0.0000 [IU] | Freq: Three times a day (TID) | SUBCUTANEOUS | Status: DC
  Administered 2020-02-15 (×2): 5 [IU] via SUBCUTANEOUS
  Administered 2020-02-15: 10:00:00 4 [IU] via SUBCUTANEOUS
  Administered 2020-02-16: 08:00:00 7 [IU] via SUBCUTANEOUS
  Filled 2020-02-15 (×3): qty 1

## 2020-02-15 MED ORDER — ISOSORBIDE MONONITRATE ER 30 MG PO TB24
30.0000 mg | ORAL_TABLET | Freq: Every day | ORAL | Status: DC
  Administered 2020-02-15 – 2020-02-18 (×4): 30 mg via ORAL
  Filled 2020-02-15 (×4): qty 1

## 2020-02-15 MED ORDER — PRASUGREL HCL 10 MG PO TABS
10.0000 mg | ORAL_TABLET | Freq: Every day | ORAL | Status: DC
  Administered 2020-02-15 – 2020-02-18 (×4): 10 mg via ORAL
  Filled 2020-02-15 (×4): qty 1

## 2020-02-15 MED ORDER — INSULIN ASPART 100 UNIT/ML ~~LOC~~ SOLN
0.0000 [IU] | Freq: Every day | SUBCUTANEOUS | Status: DC
  Administered 2020-02-15: 22:00:00 3 [IU] via SUBCUTANEOUS
  Filled 2020-02-15: qty 1

## 2020-02-15 MED ORDER — POTASSIUM CHLORIDE CRYS ER 20 MEQ PO TBCR
20.0000 meq | EXTENDED_RELEASE_TABLET | Freq: Once | ORAL | Status: AC
  Administered 2020-02-15: 13:00:00 20 meq via ORAL
  Filled 2020-02-15: qty 1

## 2020-02-15 MED ORDER — POTASSIUM CHLORIDE 20 MEQ PO PACK
40.0000 meq | PACK | Freq: Once | ORAL | Status: AC
  Administered 2020-02-15: 10:00:00 40 meq via ORAL
  Filled 2020-02-15: qty 2

## 2020-02-15 MED ORDER — METOPROLOL SUCCINATE ER 50 MG PO TB24
100.0000 mg | ORAL_TABLET | Freq: Two times a day (BID) | ORAL | Status: DC
  Administered 2020-02-15 – 2020-02-18 (×6): 100 mg via ORAL
  Filled 2020-02-15 (×6): qty 2

## 2020-02-15 MED ORDER — MONTELUKAST SODIUM 10 MG PO TABS
10.0000 mg | ORAL_TABLET | Freq: Every day | ORAL | Status: DC
  Administered 2020-02-15 – 2020-02-17 (×3): 10 mg via ORAL
  Filled 2020-02-15 (×3): qty 1

## 2020-02-15 MED ORDER — LORATADINE 10 MG PO TABS
10.0000 mg | ORAL_TABLET | Freq: Every day | ORAL | Status: DC
  Administered 2020-02-15 – 2020-02-18 (×4): 10 mg via ORAL
  Filled 2020-02-15 (×4): qty 1

## 2020-02-15 NOTE — Care Management Important Message (Signed)
Important Message  Patient Details  Name: Kenneth Osborn MRN: [id number] Date of Birth: 22-Sep-1955   Medicare Important Message Given:  Yes     Dannette Barbara 02/15/2020, 11:06 AM

## 2020-02-15 NOTE — TOC Initial Note (Signed)
Transition of Care [facility]) - Initial/Assessment Note    Patient Details  Name: Kenneth Osborn MRN: [id number] Date of Birth: 04-16-1955  Transition of Care [facility]) CM/SW Contact:    Magnus Ivan, LCSW Phone Number: 02/15/2020, 2:19 PM  Clinical Narrative:           Patient just moved to this CSW's unit today. Spoke with patient and patient's wife. Patient lives with his wife and either drives himself to appointments or wife takes him if he is unable. PCP is Dr. Kary Kos. Pharmacy is Paediatric nurse on [address]. No HH or SNF history. Patient has a stair lift, adjustable bed, RW, cane, and BSC at home already. Discussed SNF recommendation. Wife is agreeable to SNF, however patient is refusing SNF. He is agreeable to [facility] and denied an agency preference. He reported he feels safe returning home with his wife. Referral made to [facility] with [place]. CSW will continue to follow as patient is not medically ready for DC yet.        Expected Discharge Plan: [place] Barriers to Discharge: Continued Medical Work up   Patient Goals and CMS Choice Patient states their goals for this hospitalization and ongoing recovery are:: home with home health CMS Medicare.gov Compare Post Acute Care list provided to:: Patient Choice offered to / list presented to : [facility]  Expected Discharge Plan and Services Expected Discharge Plan: [place]       Living arrangements for the past 2 months: [place]: RN,PT,OT,Nurse's Aide [place] Agency: [place] ([place]) Date HH Agency Contacted: 02/15/20   Representative spoke with at [place]: Floydene Flock  Prior Living Arrangements/Services Living arrangements for the past 2 months: [place] Lives with:: Spouse Patient language and need for interpreter reviewed:: Yes Do you feel safe going back to the place where you live?: Yes      Need  for Family Participation in Patient Care: Yes (Comment) Care giver support system in place?: Yes (comment) Current home services: DME Criminal Activity/Legal Involvement Pertinent to Current Situation/Hospitalization: No - Comment as needed  Activities of Daily Living Home Assistive Devices/Equipment: None ADL Screening (condition at time of admission) Patient's cognitive ability adequate to safely complete daily activities?: Yes Is the patient deaf or have difficulty hearing?: No Does the patient have difficulty seeing, even when wearing glasses/contacts?: No Does the patient have difficulty concentrating, remembering, or making decisions?: Yes Patient able to express need for assistance with ADLs?: Yes Does the patient have difficulty dressing or bathing?: No Independently performs ADLs?: Yes (appropriate for developmental age) Does the patient have difficulty walking or climbing stairs?: No Weakness of Legs: None Weakness of Arms/Hands: None  Permission Sought/Granted Permission sought to share information with : Chartered certified accountant granted to share information with : Yes, Verbal Permission Granted     Permission granted to share info w AGENCY: Home Health agencies        Emotional Assessment       Orientation: : Oriented to Self,Oriented to Place,Oriented to  Time,Oriented to Situation Alcohol / Substance Use: Not Applicable Psych Involvement: No (comment)  Admission diagnosis:  Metabolic encephalopathy [X52.84] Acute acalculous cholecystitis [K81.0] SOB (shortness of breath) [R06.02] Renal mass [N28.89] SIRS (systemic inflammatory response syndrome) (HCC) [R65.10] Abdominal pain [R10.9] Severe  sepsis ([place]) [A41.9, R65.20] Patient Active Problem List   Diagnosis Date Noted  . Elevated LFTs   . Renal mass   . Hypokalemia   . Acute acalculous cholecystitis 02/07/2020  . Severe sepsis ([place]) 02/07/2020  . Chronic low back pain (Bilateral) w/o  sciatica 12/16/2019  . Hyperglycemia 12/16/2019  . Pharmacologic therapy 08/03/2019  . Disorder of skeletal system 08/03/2019  . Problems influencing health status 08/03/2019  . Postop check 12/17/2018  . Acute postoperative pain 12/03/2018  . DDD (degenerative disc disease), lumbosacral 09/21/2018  . Diabetic polyneuropathy associated with diabetes mellitus due to underlying condition ([place]) 09/10/2018  . Neuropathic pain 09/10/2018  . Musculoskeletal pain 09/10/2018  . AKI (acute kidney injury) ([place]) 02/19/2018  . Frequent falls 02/07/2018  . Hyperkalemia 01/18/2018  . Spondylosis without myelopathy or radiculopathy, lumbosacral region 11/13/2017  . Cardiac syncope 09/08/2017  . History of coronary artery disease 09/03/2017  . Spinal cord stimulator dysfunction ([place]) 12/26/2016  . Trigger point posterior superior iliac spine (PSIS) (Right) 12/25/2016  . 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 Polyneuropathy 12/18/2016  . Numbness and tingling 11/12/2016  . Tachycardia with heart rate 100-120 beats per minute 11/12/2016  . Meniere disease, bilateral 11/12/2016  . Headache disorder 11/12/2016  . Type 2 diabetes mellitus with diabetic neuropathy ([place]) 11/12/2016  . Type 2 diabetes mellitus with diabetic polyneuropathy, with long-term current use of insulin ([place]) 11/12/2016  . Tremor 09/11/2016  . Chronic pain syndrome 05/28/2016  . Bilateral carotid artery stenosis 01/24/2016  . Mild cognitive impairment 11/27/2015  . 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 Balance problem 10/09/2015  . Chronic tension-type headache, not intractable 10/09/2015  . Dizziness 10/09/2015  . Post-concussion headache 10/09/2015  . PSVT (paroxysmal supraventricular tachycardia) ([place]) 08/31/2015  . Diabetes mellitus, type II ([place]) 06/14/2015  . Unstable angina ([place]) 06/08/2015  . Encounter for interrogation of neurostimulator 03/22/2015  . [employer]  spinal cord stimulator (implant date: 11/01/2014) 03/22/2015  . Type 2 diabetes mellitus with hyperglycemia ([place]) 03/15/2015  . Uncontrolled type 2 diabetes mellitus with hyperglycemia, with long-term current use of insulin ([place]) 03/15/2015  . DM type 2 with diabetic peripheral neuropathy ([place]) 03/15/2015  . Acid reflux 12/21/2014  . OSA (obstructive sleep apnea) 12/21/2014  . S/P insertion of spinal cord stimulator 12/21/2014  . Chronic low back pain (Bilateral) (L>R) w/ sciatica (Left) 12/21/2014  . 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verely [facility] at [facility] on 01/04/2013) 12/21/2014  . Long term current use of opiate analgesic 12/21/2014  . Long term prescription opiate use 12/21/2014  . Opiate use (60 MME/Day) 12/21/2014  . Opiate dependence ([place]) 12/21/2014  . Encounter for therapeutic drug level monitoring 12/21/2014  . Neurogenic pain 12/21/2014  . Thoracic facet syndrome (T8-10) 12/21/2014  . Lumbar facet syndrome (Bilateral) (R>L) 12/21/2014  . Cervical facet syndrome (Right) 12/21/2014  . Cervical spondylosis 12/21/2014  . Lumbar spondylosis 12/21/2014  . Chronic upper extremity pain (Left) 12/21/2014  . Chronic cervical radicular pain (Left) 12/21/2014  . Chronic upper back pain 12/21/2014  . History of thoracic spine surgery (S/P T9-10 IVD spacer) 12/21/2014  . 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 Chronic musculoskeletal pain 12/21/2014  . Myofascial pain 12/21/2014  . Chronic lower extremity pain (Left) 12/21/2014  . Chronic radicular lumbar pain (left L4 dermatomal pain) 12/21/2014  . Coronary artery disease 12/21/2014  . History of MI (myocardial infarction) (May 2012) 12/21/2014  . Chronic anticoagulation ([place]) 12/21/2014  .  History of Meniere's disease 12/21/2014  . Sleep apnea 12/21/2014  . History of spinal surgery 11/14/2014  . CKD (chronic kidney disease) stage 3, GFR 30-59 ml/min (HCC) 09/26/2014  . Hypotension due to hypovolemia  09/22/2014  . Benign essential HTN 09/14/2014  . Hypertension associated with diabetes ([place]) 09/14/2014  . Barrett esophagus 08/12/2014  . H/O adenomatous polyp of colon 08/12/2014  . Dysphagia 08/12/2014  . GERD (gastroesophageal reflux disease) 01/25/2014  . Arteriosclerosis of coronary artery 08/13/2013  . Hyperlipidemia 08/13/2013  . CAD (coronary artery disease) 08/13/2013  . Hyperlipidemia associated with type 2 diabetes mellitus ([place]) 08/13/2013  . Auditory vertigo 12/30/2012  . Cervical spinal stenosis 12/11/2012  . Arthralgia of shoulder 03/16/2012   PCP:  [name], MD Pharmacy:   [facility] [address]), [place] [address] [place]) [place] [address] Phone: [phone] Fax: [place], [place], [address] [place], [address] [place] [address] Phone: [phone] Fax: [phone]     Social Determinants of Health (SDOH) Interventions    Readmission Risk Interventions No flowsheet data found.

## 2020-02-15 NOTE — Evaluation (Signed)
Occupational Therapy Evaluation Patient Details Name: Kenneth Osborn MRN: [id number] DOB: 1955/09/25 Today's Date: 02/15/2020    History of Present Illness 64 y.o. 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  Pain is mostly across his chest with radiation to the right arm associated with fever, chills and fatigue.  He complains of nausea but denies having any abdominal pain.  Patient felt that he may have infection with coronavirus but denies having any known exposure.  He denies having any diaphoresis, no palpitations, no shortness of breath but he has felt dizzy and lightheaded.   Clinical Impression   Patient presenting with decreased I in self care, balance, functional transfers/mobility, safety awareness, endurance, strength, and cognition.  Patient reports being mod I with use of SPC in community only PTA. Pt's wife reports he has had several falls at home. Pt very lethargic this session until therapist provided max A to transfer pt to EOB to alert him further. Supine in bed pt's BP is 167/86 and increased to 178/89 sitting EOB with c/o dizziness. Limited evaluation this session. OT discussing with wife the need for higher level of care and she verbalized understanding and agreement.  Patient will benefit from acute OT to increase overall independence in the areas of ADLs, functional mobility, safety awareness in order to safely discharge to next venue of care.      Follow Up Recommendations  SNF;Supervision/Assistance - 24 hour    Equipment Recommendations  Other (comment) (defer to nexy venue of care)       Precautions / Restrictions Precautions Precautions: Fall Restrictions Weight Bearing Restrictions: No      Mobility Bed  Mobility Overal bed mobility: Needs Assistance Bed Mobility: Supine to Sit;Sit to Supine     Supine to sit: Max assist Sit to supine: Mod assist   General bed mobility comments: Pt very lethargic at beginning of session and needing increased assistance for mobility    Transfers Overall transfer level: Needs assistance Equipment used: Rolling walker (2 wheeled) Transfers: Stand Pivot Transfers   Stand pivot transfers: Mod assist       General transfer comment: transfer deffered secondary to elevated BP    Balance Overall balance assessment: Needs assistance Sitting-balance support: Feet supported;Bilateral upper extremity supported Sitting balance-Leahy Scale: Poor Sitting balance - Comments: mod A sitting balance     Standing balance-Leahy Scale: Poor Standing balance comment: Pt with x3 R lateral LOB that required Mod A for correction with use of RW.                           ADL either performed or assessed with clinical judgement   ADL Overall ADL's : Needs assistance/impaired     Grooming: Wash/dry hands;Wash/dry face;Sitting;Minimal assistance                                 General ADL Comments: Pt with elevated BP and increased dizziness seated on EOB     Vision Baseline Vision/History: Wears glasses Wears Glasses: At all times Patient Visual Report: No change from baseline              Pertinent Vitals/Pain Pain Assessment: No/denies pain  Hand Dominance Right   Extremity/Trunk Assessment Upper Extremity Assessment Upper Extremity Assessment: Generalized weakness   Lower Extremity Assessment Lower Extremity Assessment: Generalized weakness   Cervical / Trunk Assessment Cervical / Trunk Assessment: Kyphotic   Communication Communication Communication: No difficulties   Cognition Arousal/Alertness: Lethargic Behavior During Therapy: WFL for tasks assessed/performed Overall Cognitive Status: Impaired/Different  from baseline Area of Impairment: Memory;Attention;Following commands;Safety/judgement;Awareness;Problem solving                   Current Attention Level: Sustained Memory: Decreased short-term memory Following Commands: Follows one step commands with increased time Safety/Judgement: Decreased awareness of safety;Decreased awareness of deficits Awareness: Emergent Problem Solving: Slow processing;Requires verbal cues;Requires tactile cues General Comments: Pt utilizing board to orient self but confused about activities he performed this morning with PT. Pt reports he ambulated with therapy and therapist reported he transferred to [place] only. Pt needing min cuing for safety and orientation.   General Comments  Pt presents with skin tear on posterior area of the trunk.    Exercises Other Exercises Other Exercises: Standing marching x10 with RW- Pt requiring max cueing for staying in place also requiring Min A for balance.        Home Living Family/patient expects to be discharged to:: Private residence Living Arrangements: Spouse/significant other Available Help at Discharge: Family Type of Home: House Home Access: Level entry     Home Layout: Two level Alternate Level Stairs-Number of Steps: pt reports having stair lift at home and bedrooms are upstairs Alternate Level Stairs-Rails:  (unknown) Bathroom Shower/Tub: Occupational psychologist: Standard     Home Equipment: Cane - single point          Prior Functioning/Environment Level of Independence: Independent with assistive device(s)        Comments: Pt's wife reports he falls often at home but is able to perform all self care tasks independently. He uses cane in community.        OT Problem List: Decreased strength;Pain;Decreased activity tolerance;Decreased safety awareness;Impaired balance (sitting and/or standing);Decreased knowledge of use of DME or AE;Decreased knowledge of precautions;Decreased  cognition      OT Treatment/Interventions: Self-care/ADL training;Therapeutic exercise;Balance training;Therapeutic activities;Energy conservation;Cognitive remediation/compensation;DME and/or AE instruction;Patient/family education;Manual therapy    OT Goals(Current goals can be found in the care plan section) Acute Rehab OT Goals Patient Stated Goal: to get better and go home OT Goal Formulation: With patient Time For Goal Achievement: 02/29/20 Potential to Achieve Goals: Fair ADL Goals Pt Will Perform Grooming: with min assist;standing Pt Will Perform Lower Body Dressing: with min assist;sit to/from stand Pt Will Transfer to Toilet: with min assist;ambulating Pt Will Perform Toileting - Clothing Manipulation and hygiene: with min assist;sit to/from stand  OT Frequency: Min 1X/week   Barriers to D/C:    none known at this time          AM-PAC OT "6 Clicks" Daily Activity     Outcome Measure Help from another person eating meals?: A Little Help from another person taking care of personal grooming?: A Little Help from another person toileting, which includes using toliet, bedpan, or urinal?: Total Help from another person bathing (including washing, rinsing, drying)?: A Lot Help from another person to put on and taking off regular upper body clothing?: A Little Help from another person to put on and taking off regular lower body clothing?: Total 6 Click Score: 13   End of Session Nurse Communication: Mobility status;Precautions  Activity Tolerance: Patient  limited by fatigue;Patient limited by lethargy Patient left: in bed;with call bell/phone within reach;with family/visitor present  OT Visit Diagnosis: Unsteadiness on feet (R26.81);Repeated falls (R29.6);Muscle weakness (generalized) (M62.81)                Time: 1102-1130 OT Time Calculation (min): 28 min Charges:  OT General Charges $OT Visit: 1 Visit OT Evaluation $OT Eval Moderate Complexity: 1 Mod OT  Treatments $Therapeutic Activity: 8-22 mins  Darleen Crocker, MS, OTR/L , CBIS ascom [phone]  02/15/20, 12:03 PM

## 2020-02-15 NOTE — Evaluation (Signed)
Physical Therapy Evaluation Patient Details Name: Kenneth Osborn MRN: [id number] DOB: 1955/10/08 Today's Date: 02/15/2020   History of Present Illness  PMHx significant for OSA, psychosis, CAD, HTN, HLD, DM, COPD, and chronic back pain. Admitted 12/20 with acute hypoxic respiratory failure with sepsis and cholecystitis.  Clinical Impression  The pt presents this session with impaired cognition, generalized muscle weakness and balance deficits limiting functional mobility. He is able to complete stand pivot transfer to bedside commode but with Mod A and needs for constant cueing for safety. The pt reports having good home support, however at this time does not demonstrates safety with mobility in order to d/c home with family care. At this time would recommend SNF for short term rehabilitation in order to optimize mobility prior to safe d/c home.     Follow Up Recommendations SNF    Equipment Recommendations       Recommendations for Other Services OT consult     Precautions / Restrictions Precautions Precautions: Fall Restrictions Weight Bearing Restrictions: No      Mobility  Bed Mobility Overal bed mobility: Needs Assistance Bed Mobility: Supine to Sit     Supine to sit: Mod assist     General bed mobility comments: HOB elevated, requires cues for sequencing.    Transfers Overall transfer level: Needs assistance Equipment used: Rolling walker (2 wheeled) Transfers: Stand Pivot Transfers   Stand pivot transfers: Mod assist       General transfer comment: Pt presenting with posterior lean that requires Mod A for correction.  Ambulation/Gait             General Gait Details: Deferred gait d/t cognition, c/o dizziness and impulsive nature to sit.  Stairs            Wheelchair Mobility    Modified Rankin (Stroke Patients Only)       Balance Overall balance assessment: Needs assistance   Sitting balance-Leahy Scale: Fair Sitting balance -  Comments: Pt with bilateral LOB with lateral weightshifting outside of BOS for line managment.     Standing balance-Leahy Scale: Poor Standing balance comment: Pt with x3 R lateral LOB that required Mod A for correction with use of RW.                             Pertinent Vitals/Pain Pain Assessment: No/denies pain    Home Living Family/patient expects to be discharged to:: Private residence Living Arrangements: Spouse/significant other Available Help at Discharge: Family Type of Home: House Home Access: Level entry     Home Layout: Two level (Pt stating he has 2 homes and plans to return to home in [place]. He states is unable to state the number of steps in home stating "its about a brick high") Home Equipment: Walker - 2 wheels;Cane - single point      Prior Function Level of Independence:  (unable to recall this information.)               Hand Dominance   Dominant Hand: Right    Extremity/Trunk Assessment   Upper Extremity Assessment Upper Extremity Assessment: Overall WFL for tasks assessed    Lower Extremity Assessment Lower Extremity Assessment: Generalized weakness (Pt able to perform functionally in supine, however in standing unable to achieve erect posture, bilateral flexed knees and hips.)    Cervical / Trunk Assessment Cervical / Trunk Assessment: Kyphotic  Communication   Communication: No difficulties  Cognition Arousal/Alertness: Lethargic (  Requires constant stimulation to remain alert) Behavior During Therapy: WFL for tasks assessed/performed Overall Cognitive Status: No family/caregiver present to determine baseline cognitive functioning (Per previous notes pt has hx of TBI)                                        General Comments General comments (skin integrity, edema, etc.): Pt presents with skin tear on posterior area of the trunk.    Exercises Other Exercises Other Exercises: Standing marching x10 with  RW- Pt requiring max cueing for staying in place also requiring Min A for balance.   Assessment/Plan    PT Assessment Patient needs continued PT services  PT Problem List Decreased strength;Decreased mobility;Decreased safety awareness;Decreased coordination;Decreased activity tolerance;Decreased cognition;Decreased balance       PT Treatment Interventions Therapeutic activities;Functional mobility training;Gait training;Therapeutic exercise;Patient/family education;Stair training;Balance training    PT Goals (Current goals can be found in the Care Plan section)  Acute Rehab PT Goals Patient Stated Goal: To go home with family PT Goal Formulation: With patient Time For Goal Achievement: 02/29/20 Potential to Achieve Goals: Fair    Frequency Min 2X/week   Barriers to discharge Inaccessible home environment (Would recommend speaking with wife regarding home environment as patient is not a good historian.) Would recommend speaking with wife regarding home environment as patient is not a good historian.    Co-evaluation               AM-PAC PT "6 Clicks" Mobility  Outcome Measure Help needed turning from your back to your side while in a flat bed without using bedrails?: A Lot Help needed moving from lying on your back to sitting on the side of a flat bed without using bedrails?: A Lot Help needed moving to and from a bed to a chair (including a wheelchair)?: A Lot Help needed standing up from a chair using your arms (e.g., wheelchair or bedside chair)?: A Little Help needed to walk in hospital room?: A Lot Help needed climbing 3-5 steps with a railing? : Total 6 Click Score: 12    End of Session   Activity Tolerance: No increased pain;Patient tolerated treatment well Patient left: in bed Nurse Communication: Mobility status PT Visit Diagnosis: Unsteadiness on feet (R26.81);Muscle weakness (generalized) (M62.81);History of falling (Z91.81);Dizziness and giddiness (R42)     Time: 3817-7116 PT Time Calculation (min) (ACUTE ONLY): 44 min   Charges:   PT Evaluation $PT Eval Moderate Complexity: 1 Mod PT Treatments $Therapeutic Activity: 23-37 mins       10:04 AM, 02/15/20 Stylianos Stradling A. Saverio Danker PT, DPT Physical Therapist - [facility]   Kariah Loredo A Augustin Bun 02/15/2020, 10:01 AM

## 2020-02-15 NOTE — Progress Notes (Signed)
Patient ID: Kenneth Osborn, male   DOB: [birth year], 64 y.o.   MRN: [id number] [facility] PROGRESS NOTE  Kenneth Osborn WUJ:[id number] DOB: 06-20-1955 DOA: 02/07/2020 PCP: Kenneth Pink, MD  HPI/Subjective: Patient feeling better.  Was on the critical care service for septic shock.  Patient has a gallbladder drain for acute cholecystitis.  Patient also found to have a left renal mass.  No abdominal pain.  Objective: Vitals:   02/15/20 0404 02/15/20 0817  BP: (!) 162/80 (!) 167/77  Pulse: 87 89  Resp: 20 16  Temp: 98.1 F (36.7 C) 98.3 F (36.8 C)  SpO2: 90% 95%    Intake/Output Summary (Last 24 hours) at 02/15/2020 1050 Last data filed at 02/14/2020 2000 Gross per 24 hour  Intake --  Output 1350 ml  Net -1350 ml   Filed Weights   02/07/20 1645  Weight: 102.1 kg    ROS: Review of Systems  Respiratory: Negative for shortness of breath.   Cardiovascular: Negative for chest pain.  Gastrointestinal: Positive for diarrhea. Negative for abdominal pain, nausea and vomiting.   Exam: Physical Exam HENT:     Head: Normocephalic.     Mouth/Throat:     Pharynx: No oropharyngeal exudate.  Eyes:     General: Lids are normal.     Conjunctiva/sclera: Conjunctivae normal.     Pupils: Pupils are equal, round, and reactive to light.  Cardiovascular:     Rate and Rhythm: Normal rate and regular rhythm.     Heart sounds: Normal heart sounds, S1 normal and S2 normal.  Pulmonary:     Breath sounds: Examination of the right-lower field reveals decreased breath sounds. Examination of the left-lower field reveals decreased breath sounds. Decreased breath sounds present. No wheezing, rhonchi or rales.  Abdominal:     Palpations: Abdomen is soft.     Tenderness: There is no abdominal tenderness.  Musculoskeletal:     Right lower leg: No swelling.     Left lower leg: No swelling.  Skin:    General: Skin is warm.     Findings: No rash.  Neurological:     Mental Status: He is  alert.     Comments: Answers questions appropriately.       Data Reviewed: Basic Metabolic Panel: Recent Labs  Lab 02/11/20 0423 02/12/20 0529 02/13/20 0528 02/14/20 0425 02/15/20 0627  NA 139 140 142 144 143  K 3.6 3.6 4.0 3.2* 3.3*  CL 105 104 105 105 107  CO2 27 25 27 29 24   GLUCOSE 262* 261* 309* 218* 237*  BUN 27* 25* 28* 30* 27*  CREATININE 1.02 0.88 0.94 0.85 0.87  CALCIUM 8.9 9.3 9.5 9.9 9.8  MG 1.9 1.6* 1.8 2.0 1.9  PHOS  --  3.3 2.9 2.6  --    Liver Function Tests: Recent Labs  Lab 02/09/20 0604 02/10/20 0548 02/11/20 0423 02/12/20 0529 02/15/20 0627  AST 213* 125* 42*  --  566*  ALT 488* 348* 222*  --  260*  ALKPHOS 198* 231* 177*  --  334*  BILITOT 4.0* 2.3* 1.2  --  2.5*  PROT 6.5 5.9* 6.1*  --  6.6  ALBUMIN 3.1* 2.6* 2.5* 2.3* 2.7*   CBC: Recent Labs  Lab 02/11/20 0423 02/12/20 0529 02/13/20 0528 02/14/20 0425 02/15/20 0627  WBC 6.6 7.0 7.5 10.2 9.4  NEUTROABS  --  4.1  --   --   --   HGB 11.4* 11.7* 13.2 13.5 13.0  HCT 34.2* 35.7* 39.3  40.4 39.4  MCV 83.6 83.6 82.0 82.6 83.1  PLT 179 221 261 319 316    CBG: Recent Labs  Lab 02/14/20 1943 02/14/20 2156 02/15/20 0043 02/15/20 0543 02/15/20 0951  GLUCAP 163* 216* 230* 235* 196*    Recent Results (from the past 240 hour(s))  Resp Panel by RT-PCR (Flu A&B, Covid) Nasopharyngeal Swab     Status: None   Collection Time: 02/07/20  5:46 PM   Specimen: Nasopharyngeal Swab; Nasopharyngeal(NP) swabs in vial transport medium  Result Value Ref Range Status   SARS Coronavirus 2 by RT PCR NEGATIVE NEGATIVE Final    Comment: (NOTE) SARS-CoV-2 target nucleic acids are NOT DETECTED.  The SARS-CoV-2 RNA is generally detectable in upper respiratory specimens during the acute phase of infection. The lowest concentration of SARS-CoV-2 viral copies this assay can detect is 138 copies/mL. A negative result does not preclude SARS-Cov-2 infection and should not be used as the sole basis for  treatment or other patient management decisions. A negative result may occur with  improper specimen collection/handling, submission of specimen other than nasopharyngeal swab, presence of viral mutation(s) within the areas targeted by this assay, and inadequate number of viral copies(<138 copies/mL). A negative result must be combined with clinical observations, patient history, and epidemiological information. The expected result is Negative.  Fact Sheet for Patients:  EntrepreneurPulse.com.au  Fact Sheet for Healthcare Providers:  IncredibleEmployment.be  This test is no t yet approved or cleared by the [place] FDA and  has been authorized for detection and/or diagnosis of SARS-CoV-2 by FDA under an Emergency Use Authorization (EUA). This EUA will remain  in effect (meaning this test can be used) for the duration of the COVID-19 declaration under Section 564(b)(1) of the Act, 21 U.S.C.section 360bbb-3(b)(1), unless the authorization is terminated  or revoked sooner.       Influenza A by PCR NEGATIVE NEGATIVE Final   Influenza B by PCR NEGATIVE NEGATIVE Final    Comment: (NOTE) The Xpert Xpress SARS-CoV-2/FLU/RSV plus assay is intended as an aid in the diagnosis of influenza from Nasopharyngeal swab specimens and should not be used as a sole basis for treatment. Nasal washings and aspirates are unacceptable for Xpert Xpress SARS-CoV-2/FLU/RSV testing.  Fact Sheet for Patients: EntrepreneurPulse.com.au  Fact Sheet for Healthcare Providers: IncredibleEmployment.be  This test is not yet approved or cleared by the [place] FDA and has been authorized for detection and/or diagnosis of SARS-CoV-2 by FDA under an Emergency Use Authorization (EUA). This EUA will remain in effect (meaning this test can be used) for the duration of the COVID-19 declaration under Section 564(b)(1) of the Act, 21  U.S.C. section 360bbb-3(b)(1), unless the authorization is terminated or revoked.  Performed at [facility], [address] [place], [place] [address]   Culture, blood (Routine X 2) w Reflex to ID Panel     Status: None   Collection Time: 02/07/20  5:46 PM   Specimen: BLOOD  Result Value Ref Range Status   Specimen Description BLOOD BLOOD RIGHT FOREARM  Final   Special Requests   Final    BOTTLES DRAWN AEROBIC AND ANAEROBIC Blood Culture adequate volume   Culture   Final    NO GROWTH 5 DAYS Performed at [facility], [address], [place], [place] [address]    Report Status 02/12/2020 FINAL  Final  Culture, blood (Routine X 2) w Reflex to ID Panel     Status: None   Collection Time: 02/07/20  5:46 PM  Specimen: BLOOD  Result Value Ref Range Status   Specimen Description BLOOD LEFT ANTECUBITAL  Final   Special Requests   Final    BOTTLES DRAWN AEROBIC AND ANAEROBIC Blood Culture adequate volume   Culture   Final    NO GROWTH 5 DAYS Performed at [facility], [address], [place], [place] [address]    Report Status 02/12/2020 FINAL  Final  Urine Culture     Status: Abnormal   Collection Time: 02/07/20  5:46 PM   Specimen: Urine, Random  Result Value Ref Range Status   Specimen Description   Final    URINE, RANDOM Performed at [facility], [address], [place], [place] [address]    Special Requests   Final    NONE Performed at [facility], [address] [place], [place] [address]    Culture (A)  Final    <10,000 COLONIES/mL INSIGNIFICANT GROWTH Performed at [facility], [place] [address], [place], [place] [address]    Report Status 02/08/2020 FINAL  Final  Aerobic/Anaerobic Culture (surgical/deep wound)     Status: None   Collection Time: 02/09/20  3:00 PM   Specimen: Gallbladder; Abscess  Result Value Ref Range Status   Specimen Description   Final    GALL BLADDER Performed at [facility], [address], [place], [place] [address]    Special Requests   Final    NONE Performed at [facility], [address] [place], [place] [address]    Gram Stain   Final    ABUNDANT WBC PRESENT, PREDOMINANTLY PMN MODERATE GRAM NEGATIVE RODS    Culture   Final    FEW KLEBSIELLA PNEUMONIAE NO ANAEROBES ISOLATED Performed at [facility], [place] [address] [place], [place] [address]    Report Status 02/14/2020 FINAL  Final   Organism ID, Bacteria KLEBSIELLA PNEUMONIAE  Final      Susceptibility   Klebsiella pneumoniae - MIC*    AMPICILLIN >=32 RESISTANT Resistant     CEFAZOLIN <=4 SENSITIVE Sensitive     CEFEPIME <=0.12 SENSITIVE Sensitive     CEFTAZIDIME <=1 SENSITIVE Sensitive     CEFTRIAXONE <=0.25 SENSITIVE Sensitive     CIPROFLOXACIN <=0.25 SENSITIVE Sensitive     GENTAMICIN <=1 SENSITIVE Sensitive     IMIPENEM <=0.25 SENSITIVE Sensitive     TRIMETH/SULFA <=20 SENSITIVE Sensitive     AMPICILLIN/SULBACTAM 4 SENSITIVE Sensitive     PIP/TAZO <=4 SENSITIVE Sensitive     * FEW KLEBSIELLA PNEUMONIAE  MRSA PCR Screening     Status: None   Collection Time: 02/10/20 12:00 AM   Specimen: Nasal Mucosa; Nasopharyngeal  Result Value Ref Range Status   MRSA by PCR NEGATIVE NEGATIVE Final    Comment:        The GeneXpert MRSA Assay (FDA approved for NASAL specimens only), is one component of a comprehensive MRSA colonization surveillance program. It is not intended to diagnose MRSA infection nor to guide or monitor treatment for MRSA infections. Performed at [facility], [address] [place],  [address]       Scheduled Meds:  ALPRAZolam  0.5 mg Oral QHS   cloNIDine  0.2 mg Transdermal Weekly   enoxaparin (LOVENOX) injection  0.5 mg/kg Subcutaneous Q24H   insulin aspart  0-5 Units Subcutaneous QHS   insulin aspart  0-9 Units Subcutaneous TID WC   insulin glargine  15 Units Subcutaneous BID   multivitamin with minerals  1  tablet Oral Daily   pantoprazole (PROTONIX) IV  40 mg Intravenous Q12H   Ensure Max Protein  11 oz Oral BID   Continuous Infusions:  sodium chloride Stopped (02/10/20 0904)   piperacillin-tazobactam (ZOSYN)  IV 3.375 g (02/15/20 0550)    Assessment/Plan:  1. Severe sepsis with septic shock with acute metabolic encephalopathy, acute hypoxic respiratory failure and acute kidney injury.  And acute calculus cholecystitis, present on admission.  Patient on Zosyn IV.  Transferred out of the ICU and to the medicine service.  Advance diet. 2. Elevated liver function test.  Liver function test more elevated today.  Cholecystectomy drain draining well.  Continue to monitor liver function test again tomorrow. 3. Acute kidney injury.  Creatinine 1.84 on presentation and down to 0.87 today. 4. Hypokalemia replace potassium orally 5. Weakness continued physical therapy evaluation. 6. Type 2 diabetes mellitus with hyperlipidemia.  Hold Crestor with elevated liver function tests continue glargine insulin and sliding scale. 7. Sleep apnea use CPAP and oxygen at night 8. Essential hypertension on clonidine patch currently 9. Left renal mass.  Will need urology follow-up as outpatient and surgery in conjunction with general surgery to remove gallbladder and kidney.  Okay to remove Foley as per urology.        Code Status:     Code Status Orders  (From admission, onward)         Start     Ordered   02/07/20 2228  Full code  Continuous        02/07/20 2230        Code Status History    Date Active Date Inactive Code Status Order ID Comments User Context   02/19/2018 1152 02/20/2018 1450 DNR [id number]  Demetrios Loll, MD Inpatient   02/19/2018 0138 02/19/2018 1152 Full Code [id number]  Sedalia Muta, MD Inpatient   01/18/2018 1609 01/19/2018 1800 Full Code [id number]  Fritzi Mandes, MD Inpatient   06/15/2015 1047 06/16/2015 1450 Full Code [id number]  Isaias Cowman, MD Inpatient   Advance Care Planning  Activity     Family Communication: Wife at the bedside Disposition Plan: Status is: Inpatient  Dispo: The patient is from: Home              Anticipated d/c is to: Home health versus rehab depending on clinical course with physical therapy              Anticipated d/c date is: Likely a few more days here in the hospital              Patient currently needs to get stronger in order to get out of the hospital.  Consultants:  General surgery  Urology  Procedures:  IR placement of gallbladder drain  Antibiotics:  Zosyn  Time spent: 28 minutes  [place]

## 2020-02-15 NOTE — Progress Notes (Signed)
PHARMACY CONSULT NOTE  Pharmacy Consult for Electrolyte Monitoring and Replacement   Recent Labs: Potassium (mmol/L)  Date Value  02/15/2020 3.3 (L)  02/24/2014 3.9   Magnesium (mg/dL)  Date Value  02/15/2020 1.9  05/14/2013 1.5 (L)   Calcium (mg/dL)  Date Value  02/15/2020 9.8   Calcium, Total (mg/dL)  Date Value  02/24/2014 8.4 (L)   Albumin (g/dL)  Date Value  02/15/2020 2.7 (L)  08/18/2013 3.8   Phosphorus (mg/dL)  Date Value  02/14/2020 2.6   Sodium (mmol/L)  Date Value  02/15/2020 143  02/24/2014 142     Assessment: 64 y.o. male with medical history significant for coronary artery disease status post stent angioplasty, COPD, chronic low back pain, GERD, hypertension, diabetes mellitus with complications of stage III kidney disease and Mnire's disease who presented to the emergency room for evaluation of chest pain and was found to have acute acalculous cholecystitis s/p IR drain 12/81 complicated by septic encephalopathy requiring intubation and ventilation, subsequently extubated on 12/26  Goal of Therapy:  Electrolytes WNL  Plan:   K: 3.3. Will order KCL 57mq po x 1 dose   NO additional replacement warranted at this time.   Re-check electrolytes in am  SPernell Dupre PharmD, BCPS Clinical Pharmacist 02/15/2020 8:17 AM

## 2020-02-15 NOTE — Progress Notes (Signed)
Pt stated that he is not going to wear CPAP at night. He says he is unable to wear at home too.

## 2020-02-16 ENCOUNTER — Telehealth: Payer: Self-pay | Admitting: Pain Medicine

## 2020-02-16 DIAGNOSIS — K810 Acute cholecystitis: Secondary | ICD-10-CM | POA: Diagnosis not present

## 2020-02-16 LAB — GLUCOSE, CAPILLARY
Glucose-Capillary: 346 mg/dL — ABNORMAL HIGH (ref 70–99)
Glucose-Capillary: 328 mg/dL — ABNORMAL HIGH (ref 70–99)
Glucose-Capillary: 247 mg/dL — ABNORMAL HIGH (ref 70–99)
Glucose-Capillary: 237 mg/dL — ABNORMAL HIGH (ref 70–99)

## 2020-02-16 LAB — CBC
HCT: 38 % — ABNORMAL LOW (ref 39.0–52.0)
Hemoglobin: 12.6 g/dL — ABNORMAL LOW (ref 13.0–17.0)
MCH: 27.5 pg (ref 26.0–34.0)
MCHC: 33.2 g/dL (ref 30.0–36.0)
MCV: 82.8 fL (ref 80.0–100.0)
Platelets: 301 10*3/uL (ref 150–400)
RBC: 4.59 MIL/uL (ref 4.22–5.81)
RDW: 14.8 % (ref 11.5–15.5)
WBC: 7 10*3/uL (ref 4.0–10.5)
nRBC: 0 % (ref 0.0–0.2)

## 2020-02-16 LAB — COMPREHENSIVE METABOLIC PANEL
ALT: 574 U/L — ABNORMAL HIGH (ref 0–44)
AST: 395 U/L — ABNORMAL HIGH (ref 15–41)
Albumin: 2.5 g/dL — ABNORMAL LOW (ref 3.5–5.0)
Alkaline Phosphatase: 328 U/L — ABNORMAL HIGH (ref 38–126)
Anion gap: 10 (ref 5–15)
BUN: 24 mg/dL — ABNORMAL HIGH (ref 8–23)
CO2: 25 mmol/L (ref 22–32)
Calcium: 9.6 mg/dL (ref 8.9–10.3)
Chloride: 106 mmol/L (ref 98–111)
Creatinine, Ser: 0.91 mg/dL (ref 0.61–1.24)
GFR, Estimated: >60 mL/min (ref >60)
Glucose, Bld: 263 mg/dL — ABNORMAL HIGH (ref 70–99)
Potassium: 3.4 mmol/L — ABNORMAL LOW (ref 3.5–5.1)
Sodium: 141 mmol/L (ref 135–145)
Total Bilirubin: 1.3 mg/dL — ABNORMAL HIGH (ref 0.3–1.2)
Total Protein: 6.3 g/dL — ABNORMAL LOW (ref 6.5–8.1)

## 2020-02-16 LAB — MAGNESIUM: Magnesium: 1.7 mg/dL (ref 1.7–2.4)

## 2020-02-16 MED ORDER — INSULIN GLARGINE 100 UNIT/ML ~~LOC~~ SOLN
20.0000 [IU] | Freq: Two times a day (BID) | SUBCUTANEOUS | Status: DC
  Administered 2020-02-16 – 2020-02-18 (×4): 20 [IU] via SUBCUTANEOUS
  Filled 2020-02-16 (×6): qty 0.2

## 2020-02-16 MED ORDER — ENALAPRIL MALEATE 10 MG PO TABS
10.0000 mg | ORAL_TABLET | Freq: Two times a day (BID) | ORAL | Status: DC
  Administered 2020-02-17 – 2020-02-18 (×3): 10 mg via ORAL
  Filled 2020-02-16 (×4): qty 1

## 2020-02-16 MED ORDER — INSULIN ASPART 100 UNIT/ML ~~LOC~~ SOLN
0.0000 [IU] | Freq: Every day | SUBCUTANEOUS | Status: DC
  Administered 2020-02-16: 23:00:00 2 [IU] via SUBCUTANEOUS
  Filled 2020-02-16: qty 1

## 2020-02-16 MED ORDER — DILTIAZEM HCL ER COATED BEADS 240 MG PO CP24
240.0000 mg | ORAL_CAPSULE | Freq: Every day | ORAL | Status: DC
  Administered 2020-02-17 – 2020-02-18 (×2): 240 mg via ORAL
  Filled 2020-02-16 (×2): qty 1

## 2020-02-16 MED ORDER — MAGNESIUM SULFATE IN D5W 1-5 GM/100ML-% IV SOLN
1.0000 g | Freq: Once | INTRAVENOUS | Status: AC
  Administered 2020-02-16: 11:00:00 1 g via INTRAVENOUS
  Filled 2020-02-16: qty 100

## 2020-02-16 MED ORDER — INSULIN GLARGINE 100 UNIT/ML ~~LOC~~ SOLN
5.0000 [IU] | Freq: Once | SUBCUTANEOUS | Status: AC
  Administered 2020-02-16: 13:00:00 5 [IU] via SUBCUTANEOUS
  Filled 2020-02-16: qty 0.05

## 2020-02-16 MED ORDER — DILTIAZEM HCL 30 MG PO TABS: 60.0000 mg | ORAL_TABLET | Freq: Four times a day (QID) | ORAL | Status: DC

## 2020-02-16 MED ORDER — INSULIN ASPART 100 UNIT/ML ~~LOC~~ SOLN
0.0000 [IU] | Freq: Three times a day (TID) | SUBCUTANEOUS | Status: DC
  Administered 2020-02-16: 17:00:00 5 [IU] via SUBCUTANEOUS
  Administered 2020-02-17: 17:00:00 3 [IU] via SUBCUTANEOUS
  Administered 2020-02-17 – 2020-02-18 (×3): 5 [IU] via SUBCUTANEOUS
  Filled 2020-02-16 (×5): qty 1

## 2020-02-16 MED ORDER — POTASSIUM CHLORIDE CRYS ER 20 MEQ PO TBCR
40.0000 meq | EXTENDED_RELEASE_TABLET | Freq: Once | ORAL | Status: AC
  Administered 2020-02-16: 11:00:00 40 meq via ORAL
  Filled 2020-02-16: qty 2

## 2020-02-16 MED ORDER — INSULIN ASPART 100 UNIT/ML ~~LOC~~ SOLN
5.0000 [IU] | Freq: Three times a day (TID) | SUBCUTANEOUS | Status: DC
  Administered 2020-02-16 – 2020-02-18 (×6): 5 [IU] via SUBCUTANEOUS
  Filled 2020-02-16 (×6): qty 1

## 2020-02-16 MED ORDER — DILTIAZEM HCL 30 MG PO TABS
30.0000 mg | ORAL_TABLET | Freq: Four times a day (QID) | ORAL | Status: AC
  Administered 2020-02-16 (×2): 30 mg via ORAL
  Filled 2020-02-16 (×3): qty 1

## 2020-02-16 NOTE — Telephone Encounter (Signed)
Patient became septic in gallbladder, he is currently in hospital [facility]. Will be there about 2weeks. Also has mass on kidney. He cant come to appt. He will call when he is getting out of hospital to let us know where he will be. He will need medications when he comes out of hospital.

## 2020-02-16 NOTE — Progress Notes (Signed)
PHARMACY CONSULT NOTE  Pharmacy Consult for Electrolyte Monitoring and Replacement   Recent Labs: Potassium (mmol/L)  Date Value  02/16/2020 3.4 (L)  02/24/2014 3.9   Magnesium (mg/dL)  Date Value  02/16/2020 1.7  05/14/2013 1.5 (L)   Calcium (mg/dL)  Date Value  02/16/2020 9.6   Calcium, Total (mg/dL)  Date Value  02/24/2014 8.4 (L)   Albumin (g/dL)  Date Value  02/16/2020 2.5 (L)  08/18/2013 3.8   Phosphorus (mg/dL)  Date Value  02/14/2020 2.6   Sodium (mmol/L)  Date Value  02/16/2020 141  02/24/2014 142     Assessment: 64 y.o. male with medical history significant for coronary artery disease status post stent angioplasty, COPD, chronic low back pain, GERD, hypertension, diabetes mellitus with complications of stage III kidney disease and Mnire's disease who presented to the emergency room for evaluation of chest pain and was found to have acute acalculous cholecystitis s/p IR drain 15/18 complicated by septic encephalopathy requiring intubation and ventilation, subsequently extubated on 12/26  Goal of Therapy:  Electrolytes WNL  Plan:   K: 3.4, Mg 1.7. Will order KCL 4mq po x 1 dose and Magnesium 1g IV x 1 dose.   No additional replacement warranted at this time.   Re-check electrolytes in am  SPernell Dupre PharmD, BCPS Clinical Pharmacist 02/16/2020 8:14 AM

## 2020-02-16 NOTE — Progress Notes (Signed)
Inpatient Diabetes Program Recommendations  AACE/ADA: New Consensus Statement on Inpatient Glycemic Control   Target Ranges:  Prepandial:   less than 140 mg/dL      Peak postprandial:   less than 180 mg/dL (1-2 hours)      Critically ill patients:  140 - 180 mg/dL   Results for BLU, LORI" (MRN [id number]) as of 02/16/2020 08:21  Ref. Range 02/15/2020 05:43 02/15/2020 09:51 02/15/2020 11:29 02/15/2020 13:20 02/15/2020 17:12 02/15/2020 22:05 02/16/2020 07:44  Glucose-Capillary Latest Ref Range: 70 - 99 mg/dL 235 (H) 196 (H) 240 (H) 264 (H) 281 (H) 251 (H) 346 (H)   Review of Glycemic Control  Diabetes history: DM2 Outpatient Diabetes medications: Humulin R U500 80 units with breakfast and lunch, Humulin R U500 70 units with super, Metformin 1000 mg BID Current orders for Inpatient glycemic control: Lantus 15 units BID, Novolog 0-9 units TID with meals, Novolog 0-5 units QHS  Inpatient Diabetes Program Recommendations:    Insulin: Please consider increasing Lantus to 20 units BID, increasing correction scale to Novolog 0-15 units TID with meals, and adding Novolog 5 units TID with meals for meal coverage if patient eats at least 50% of meals.  Thanks, Barnie Alderman, RN, MSN, CDE Diabetes Coordinator Inpatient Diabetes Program [phone] (Team Pager from 8am to 5pm)

## 2020-02-16 NOTE — Telephone Encounter (Signed)
Noted  

## 2020-02-16 NOTE — Progress Notes (Signed)
[facility] SURGICAL PROGRESS NOTE (cpt [phone])  Hospital Day(s): 9.   Interval History: Patient seen and examined, no acute events or new complaints overnight. Patient reports he is feeling okay this morning. He denied any fever, chills, abdominal pain, nausea, emesis. Remains without leukocytosis with WBC 7.0k. He still has has LFT elevation but improvement in hyperbilirubinemia. sCr remains normal at 0.91. Improvement in hypokalemia to 3.4. Cholecystostomy output recorded at only 50 ccs; more bilious. Cx growing klebsiella pneumoniae which is sensitive to zosyn. Marland Kitchen He has been advanced to heart healthy diet and doing well with this. Worked with PT/OT and current recommendations are SNF; however, patient is refusing.   Review of Systems:  Constitutional: denies fever, chills  HEENT: denies cough or congestion  Respiratory: denies any shortness of breath  Cardiovascular: denies chest pain or palpitations  Gastrointestinal: denies abdominal pain, N/V, or diarrhea/and bowel function as per interval history Genitourinary: denies burning with urination or urinary frequency   Vital signs in last 24 hours: [min-max] current  Temp:  [97.8 F (36.6 C)-98.3 F (36.8 C)] 98 F (36.7 C) (12/29 0314) Pulse Rate:  [59-89] 64 (12/29 0314) Resp:  [16-19] 17 (12/29 0314) BP: (128-167)/(67-84) 130/67 (12/29 0314) SpO2:  [95 %-98 %] 96 % (12/29 0314)     Height: 5' 9"  (175.3 cm) Weight: 102.1 kg BMI (Calculated): 33.21   Intake/Output last 2 shifts:  12/28 0701 - 12/29 0700 In: 120 [P.O.:120] Out: 2050 [Urine:2000; Drains:50]   Physical Exam:  Constitutional: alert, cooperative and no distress  HENT: normocephalic without obvious abnormality  Eyes: PERRL, EOM's grossly intact and symmetric  Respiratory: breathing non-labored at rest Cardiovascular: regular rate and sinus rhythm  Gastrointestinal: soft, non-tender, and non-distended, cholecystostomy tube in the RUQ, output  serosanguinous but appears more bilious Musculoskeletal: no edema or wounds, motor and sensation grossly intact, NT    Labs:  CBC Latest Ref Rng & Units 02/16/2020 02/15/2020 02/14/2020  WBC 4.0 - 10.5 K/uL 7.0 9.4 10.2  Hemoglobin 13.0 - 17.0 g/dL 12.6(L) 13.0 13.5  Hematocrit 39.0 - 52.0 % 38.0(L) 39.4 40.4  Platelets 150 - 400 K/uL 301 316 319   CMP Latest Ref Rng & Units 02/15/2020 02/14/2020 02/13/2020  Glucose 70 - 99 mg/dL 237(H) 218(H) 309(H)  BUN 8 - 23 mg/dL 27(H) 30(H) 28(H)  Creatinine 0.61 - 1.24 mg/dL 0.87 0.85 0.94  Sodium 135 - 145 mmol/L 143 144 142  Potassium 3.5 - 5.1 mmol/L 3.3(L) 3.2(L) 4.0  Chloride 98 - 111 mmol/L 107 105 105  CO2 22 - 32 mmol/L 24 29 27   Calcium 8.9 - 10.3 mg/dL 9.8 9.9 9.5  Total Protein 6.5 - 8.1 g/dL 6.6 - -  Total Bilirubin 0.3 - 1.2 mg/dL 2.5(H) - -  Alkaline Phos 38 - 126 U/L 334(H) - -  AST 15 - 41 U/L 566(H) - -  ALT 0 - 44 U/L 260(H) - -     Imaging studies: No new pertinent imaging studies   Assessment/Plan: (ICD-10's: K81.0) 64 y.o.malewith now resolved septicemia, likely transient, secondary to cholecystitis s/p percutaneous cholecystostomy tube placement (12/22),complicated by pertinent comorbidities includingsignificant cardiovascular disease, CAD s/p PCI   - Okay to continue diet as tolerates   - Continue IV Abx (Zosyn) while hospitalized; okay to transition to PO Augmentin for home, recommend completing 14 days total.    - Continue cholecystotomy tube; monitor and record output              - Monitor abdominal examination;  on-going bowel fucntion              - Pain control prn; antiemetics prn             - Mobilize as tolerated   - Further management per primary service   - Discharge Planning: Okay for discharge from surgical standpoint once medically ready; appears to be pending [facility] vs SNF. ABx as above. He will need follow up with general surgery in 2-3 weeks once discharged. We will be available   All of the  above findings and recommendations were discussed with the patient, and the medical team, and all of patient's questions were answered to his expressed satisfaction.  -- Kenneth Simon, PA-C [facility] 02/16/2020, 6:54 AM [phone] M-F: 7am - 4pm

## 2020-02-16 NOTE — Progress Notes (Addendum)
Physical Therapy Treatment Patient Details Name: Kenneth Osborn MRN: [id number] DOB: 02-16-1956 Today's Date: 02/16/2020    History of Present Illness 64 y.o. 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  Pain is mostly across his chest with radiation to the right arm associated with fever, chills and fatigue.  He complains of nausea but denies having any abdominal pain.  Patient felt that he may have infection with coronavirus but denies having any known exposure.  He denies having any diaphoresis, no palpitations, no shortness of breath but he has felt dizzy and lightheaded.    PT Comments    Pt voiced frustration with staff not sitting in room with him per his request.  Education provided but he continued to voice being unhappy about that.  Pt inc of bowel and bladder.  Care provided.  Mod a x 1 for rolling and max encouragement and cues for hand placements.  He has difficulty coming to sitting and needs mod a x 1.  Sitting balance with Hand held support on rail and min/mod a x 1 to prevent fall back to right.  Stands with min a x 2 and is able to transfer to recliner.  After short rest, he is able to progress gait into hallway and around nursing unit.  Gait is generally unsteady with high fall risk requires +2 assist for safety.  He is motivated to increase mobility.  Has a chair lift at home to access bedroom on second floor.  Pt needs almost constant cues to stay on task and focus vs talking, mostly unrelated to tasks at hand.    Pt is firm in his refusal for SNF.  Very talkative and tells many stories of family members that have passed in SNF.  SNF is still recommended to increase functional independence, strength, safety and balance but he does  not seem to agree.  He stated he has all equipment needed.  Will need +24 hour care at home with assist for all mobility at this time.  He remains a high fall risk.  Stated his wife is home and son lives with them but works during the day.    Addendum.  Talked with wife and updated on progress.  She is aware of assist level and is in agreement SNF is best at discharge.   Follow Up Recommendations  SNF     Equipment Recommendations       Recommendations for Other Services       Precautions / Restrictions Precautions Precautions: Fall Restrictions Weight Bearing Restrictions: No    Mobility  Bed Mobility Overal bed mobility: Needs Assistance Bed Mobility: Supine to Sit     Supine to sit: Mod assist     General bed mobility comments: max cues for hand placements and encouragement  Transfers Overall transfer level: Needs assistance Equipment used: Rolling walker (2 wheeled) Transfers: Sit to/from Stand Sit to Stand: Min assist;+2 physical assistance            Ambulation/Gait Ambulation/Gait assistance: Min assist;+2 physical assistance Gait Distance (Feet): 160 Feet Assistive device: Rolling walker (2 wheeled) Gait Pattern/deviations: Step-through pattern;Decreased step length - right;Decreased step length - left;Narrow base of support Gait velocity: decreased   General Gait Details: gait unsteady with +2 assist   Stairs  Wheelchair Mobility    Modified Rankin (Stroke Patients Only)       Balance Overall balance assessment: Needs assistance Sitting-balance support: Feet supported;Bilateral upper extremity supported Sitting balance-Leahy Scale: Poor Sitting balance - Comments: requires min a x 1 to prevent falling back to right on bed     Standing balance-Leahy Scale: Poor Standing balance comment: heavy reliance on RW with +2 assist for safety                            Cognition Arousal/Alertness: Awake/alert Behavior  During Therapy: WFL for tasks assessed/performed Overall Cognitive Status: Difficult to assess                                 General Comments: Very talkative and difficult to remain on task but seems more clear today      Exercises      General Comments        Pertinent Vitals/Pain Pain Assessment: No/denies pain    Home Living                      Prior Function            PT Goals (current goals can now be found in the care plan section) Progress towards PT goals: Progressing toward goals    Frequency    Min 2X/week      PT Plan Current plan remains appropriate    Co-evaluation              AM-PAC PT "6 Clicks" Mobility   Outcome Measure  Help needed turning from your back to your side while in a flat bed without using bedrails?: A Lot Help needed moving from lying on your back to sitting on the side of a flat bed without using bedrails?: A Lot Help needed moving to and from a bed to a chair (including a wheelchair)?: A Lot Help needed standing up from a chair using your arms (e.g., wheelchair or bedside chair)?: A Little Help needed to walk in hospital room?: A Lot Help needed climbing 3-5 steps with a railing? : Total 6 Click Score: 12    End of Session Equipment Utilized During Treatment: Gait belt Activity Tolerance: Patient tolerated treatment well;No increased pain Patient left: in chair;with call bell/phone within reach;with chair alarm set Nurse Communication: Mobility status       Time: 0100-7121 PT Time Calculation (min) (ACUTE ONLY): 28 min  Charges:  $Gait Training: 8-22 mins $Therapeutic Activity: 8-22 mins                    Chesley Noon, PTA 02/16/20, 10:52 AM

## 2020-02-16 NOTE — Progress Notes (Signed)
PROGRESS NOTE    Kenneth Osborn  UUE:[id number] DOB: Sep 19, 1955 DOA: 02/07/2020 PCP: [name], MD   Chief Complaint  Patient presents with  . Chest Pain    Brief Narrative: 64 year old male with history of CAD with a stent angioplasty, COPD, chronic low back pain, GERD, hypertension diabetes mellitus with stage III kidney disease, Mnire's disease presented to the ED for chest pain, he felt she may have covid.  Work-up in the ED showed hyperglycemia, anion gap metabolic acidosis elevated LFTs and bilirubin, lactic acidosis ultrasound showed gallbladder wall sludge with finding representing sequelae of acalculous cholecystitis, CT of the abdomen with mild laziness of the gallbladder wall and pericholecystic fat, no calcified stone, 5.6X5.9 heterogeneously enhancing mass in the upper pole of the left kidney most concerning for malignancy.  Patient was admitted underwent cholecystostomy tube placement but continued to deteriorate requiring increased oxygen and IV fluids and unresponsive with severe acute hypoxic respiratory failure and circulatory shock/septic shock in the setting of cholecystitis.  He was managed in ICU.  Patient was subsequently stabilized, extubated 12/27 and has been off pressors Transferred to medical for 12/28.  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  Has been on empiric Zosyn-and on discharge will switch to Augmentin to complete 2 weeks course based on culture sensitivity - drain grew Klebsiella .  Patient will need outpatient close follow-up and cholecystectomy.  Patient having bloody output is on Lovenox and-we will DC Lovenox, discussed with surgery.  Hopefully drain will improve.  Continue flush with 5 mill NS daily.  Abnormal LFTs in the setting of cholecystitis/sepsis/shock- noted worsening ALT but improving AST, monitor cholecystostomy drain and monitor LFTs closely hold off on statin.  Acute hepatitis panel negative. Recent Labs  Lab 02/10/20 0548 02/11/20 0423 02/12/20 0529 02/15/20 0627 02/16/20 0549  AST 125* 42*  --  566* 395*  ALT 348* 222*  --  260* 574*  ALKPHOS 231* 177*  --  334* 328*  BILITOT 2.3* 1.2  --  2.5* 1.3*  PROT 5.9* 6.1*  --  6.6 6.3*  ALBUMIN 2.6* 2.5* 2.3* 2.7* 2.5*   Type 2 diabetes mellitus with uncontrolled hyperglycemia, poorly controlled hemoglobin A1c 8.2.  Sugar 240s to 340s.  Patient is on U500-40 units 3 times daily ( but he takes 70-80 utnies tiad based on sugar level), also on metformin at home, can resumhome regimen on d/c.  We will increase the Lantus and adjust the correcting NovoLog sliding scale and add NovoLog premeal while her. Recent Labs  Lab 02/15/20 1129 02/15/20 1320 02/15/20 1712 02/15/20 2205 02/16/20 0744  GLUCAP 240* 264* 281* 251* 346*   Lab Results  Component Value Date   HGBA1C 8.2 (H) 02/07/2020   Sepsis with septic shock present on admission, blood pressure is stabilized off pressors.  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  Benign essential HTN: On clonidine patch.  Blood pressure stable.  Normally patient is on Vasotec 10 mg twice daily, Cardizem to 240 mg at bedtime and will transition from clonidine to home meds-starting with low-dose Cardizem every 6 hours.  Morbid obesity with BMI 33, will benefit with weight loss and healthy lifestyle.  OSA continue CPAP as tolerated  Nutrition: Diet Order            Diet heart healthy/carb modified Room service appropriate? Yes; Fluid consistency: Thin  Diet effective now                 Nutrition Problem: Inadequate oral intake Etiology: inability to eat (pt sedated and ventilated) Signs/Symptoms: NPO status    Body mass index is 33.23 kg/m.   DVT prophylaxis: lovenox-will hold due to bloody output on the drain and add SCDs Code Status:   Code Status: Full Code  Family Communication: plan of care discussed with patient at bedside. Discussed w/ surgery team.  Status is: Inpatient  Remains inpatient appropriate because:IV treatments appropriate due to intensity of illness or inability to take PO and Inpatient level of care appropriate due to severity of illness  Dispo: The patient is from: Home              Anticipated d/c is to: Skilled nursing facility advised, wife agreeable but patient refused. Willing to go to [facility] but not others.             Anticipated d/c date is: 1-2 day              Patient currently is not medically stable to d/c.  Consultants:see note  Procedures:see note  Culture/Microbiology    Component Value Date/Time   SDES  02/09/2020 1500    GALL BLADDER Performed at [facility], [address] [place], [place] [address]    [facility]  02/09/2020 1500    NONE Performed at  [facility], [address] [place], [place] [address]    CULT  02/09/2020 1500    FEW KLEBSIELLA PNEUMONIAE NO ANAEROBES ISOLATED Performed at [facility], [place] [address], [place], [place] [address]    REPTSTATUS 02/14/2020 FINAL 02/09/2020 1500    Other culture-see note  Medications: Scheduled Meds: . ALPRAZolam  0.5 mg Oral QHS  . cloNIDine  0.2 mg Transdermal Weekly  . enoxaparin (LOVENOX) injection  0.5 mg/kg Subcutaneous Q24H  . insulin aspart  0-5 Units Subcutaneous QHS  . insulin aspart  0-9 Units Subcutaneous TID WC  . insulin glargine  15 Units Subcutaneous BID  . isosorbide mononitrate  30 mg Oral Daily  . loratadine  10 mg Oral Daily  . metoprolol succinate  100 mg Oral BID  . montelukast  10 mg Oral QHS  . multivitamin with minerals  1 tablet Oral Daily  . pantoprazole  40 mg Oral BID  . prasugrel  10 mg Oral Daily  . Ensure Max Protein  11 oz Oral BID   Continuous Infusions: . sodium chloride Stopped (  02/10/20 0904)  . piperacillin-tazobactam (ZOSYN)  IV 3.375 g (02/16/20 0315)    Antimicrobials: Anti-infectives (From admission, onward)   Start     Dose/Rate Route Frequency Ordered Stop   02/08/20 1800  cefTRIAXone (ROCEPHIN) 2 g in sodium chloride 0.9 % 100 mL IVPB  Status:  Discontinued        2 g 200 mL/hr over 30 Minutes Intravenous Every 24 hours 02/07/20 2230 02/07/20 2300   02/07/20 2330  piperacillin-tazobactam (ZOSYN) IVPB 3.375 g        3.375 g 12.5 mL/hr over 240 Minutes Intravenous Every 8 hours 02/07/20 2230 02/16/20 2359   02/07/20 2230  metroNIDAZOLE (FLAGYL) IVPB 500 mg  Status:  Discontinued        500 mg 100 mL/hr over 60 Minutes Intravenous  Once 02/07/20 2223 02/07/20 2259   02/07/20 1930  cefTRIAXone (ROCEPHIN) 2 g in sodium chloride 0.9 % 100 mL IVPB        2 g 200 mL/hr over 30 Minutes Intravenous  Once 02/07/20 1918 02/07/20 2101     Objective: Vitals: Today's Vitals   02/16/20 0051 02/16/20 0143 02/16/20  0314 02/16/20 0743  BP: 128/84  130/67 (!) 171/76  Pulse: 73  64 60  Resp: 19  17 16   Temp: 98.3 F (36.8 C)  98 F (36.7 C) 98.3 F (36.8 C)  TempSrc:    Oral  SpO2: 98%  96% 95%  Weight:      Height:      PainSc:  0-No pain      Intake/Output Summary (Last 24 hours) at 02/16/2020 0745 Last data filed at 02/15/2020 2300 Gross per 24 hour  Intake 120 ml  Output 2050 ml  Net -1930 ml   Filed Weights   02/07/20 1645  Weight: 102.1 kg   Weight change:   Intake/Output from previous day: 12/28 0701 - 12/29 0700 In: 120 [P.O.:120] Out: 2050 [Urine:2000; Drains:50] Intake/Output this shift: No intake/output data recorded.  Examination: General exam: AAOx3 ,NAD, weak appearing. HEENT:Oral mucosa moist, Ear/Nose WNL grossly,dentition normal. Respiratory system: bilaterally clear,no wheezing or crackles,no use of accessory muscle, non tender. Cardiovascular system: S1 & S2 +, regular, No JVD. Gastrointestinal system: Abdomen soft, NT,ND, BS+.  Cholecystostomy tube on right side with bloody output. Nervous System:Alert, awake, moving extremities and grossly nonfocal Extremities: No edema, distal peripheral pulses palpable.  Skin: No rashes,no icterus. MSK: Normal muscle bulk,tone, power  Data Reviewed: I have personally reviewed following labs and imaging studies CBC: Recent Labs  Lab 02/12/20 0529 02/13/20 0528 02/14/20 0425 02/15/20 0627 02/16/20 0549  WBC 7.0 7.5 10.2 9.4 7.0  NEUTROABS 4.1  --   --   --   --   HGB 11.7* 13.2 13.5 13.0 12.6*  HCT 35.7* 39.3 40.4 39.4 38.0*  MCV 83.6 82.0 82.6 83.1 82.8  PLT 221 261 319 316 121   Basic Metabolic Panel: Recent Labs  Lab 02/12/20 0529 02/13/20 0528 02/14/20 0425 02/15/20 0627 02/16/20 0549  NA 140 142 144 143 141  K 3.6 4.0 3.2* 3.3* 3.4*  CL 104 105 105 107 106  CO2 25 27 29 24 25   GLUCOSE 261* 309* 218* 237* 263*  BUN 25* 28* 30* 27* 24*  CREATININE 0.88 0.94 0.85 0.87 0.91  CALCIUM 9.3 9.5 9.9 9.8  9.6  MG 1.6* 1.8 2.0 1.9 1.7  PHOS 3.3 2.9 2.6  --   --    GFR: Estimated Creatinine Clearance: 95.4 mL/min (by C-G formula based on SCr  of 0.91 mg/dL). Liver Function Tests: Recent Labs  Lab 02/10/20 0548 02/11/20 0423 02/12/20 0529 02/15/20 0627 02/16/20 0549  AST 125* 42*  --  566* 395*  ALT 348* 222*  --  260* 574*  ALKPHOS 231* 177*  --  334* 328*  BILITOT 2.3* 1.2  --  2.5* 1.3*  PROT 5.9* 6.1*  --  6.6 6.3*  ALBUMIN 2.6* 2.5* 2.3* 2.7* 2.5*   No results for input(s): LIPASE, AMYLASE in the last 168 hours. No results for input(s): AMMONIA in the last 168 hours. Coagulation Profile: No results for input(s): INR, PROTIME in the last 168 hours. Cardiac Enzymes: No results for input(s): CKTOTAL, CKMB, CKMBINDEX, TROPONINI in the last 168 hours. BNP (last 3 results) No results for input(s): PROBNP in the last 8760 hours. HbA1C: No results for input(s): HGBA1C in the last 72 hours. CBG: Recent Labs  Lab 02/15/20 1129 02/15/20 1320 02/15/20 1712 02/15/20 2205 02/16/20 0744  GLUCAP 240* 264* 281* 251* 346*   Lipid Profile: No results for input(s): CHOL, HDL, LDLCALC, TRIG, CHOLHDL, LDLDIRECT in the last 72 hours. Thyroid Function Tests: No results for input(s): TSH, T4TOTAL, FREET4, T3FREE, THYROIDAB in the last 72 hours. Anemia Panel: No results for input(s): VITAMINB12, FOLATE, FERRITIN, TIBC, IRON, RETICCTPCT in the last 72 hours. Sepsis Labs: No results for input(s): PROCALCITON, LATICACIDVEN in the last 168 hours.  Recent Results (from the past 240 hour(s))  Resp Panel by RT-PCR (Flu A&B, Covid) Nasopharyngeal Swab     Status: None   Collection Time: 02/07/20  5:46 PM   Specimen: Nasopharyngeal Swab; Nasopharyngeal(NP) swabs in vial transport medium  Result Value Ref Range Status   SARS Coronavirus 2 by RT PCR NEGATIVE NEGATIVE Final    Comment: (NOTE) SARS-CoV-2 target nucleic acids are NOT DETECTED.  The SARS-CoV-2 RNA is generally detectable in upper  respiratory specimens during the acute phase of infection. The lowest concentration of SARS-CoV-2 viral copies this assay can detect is 138 copies/mL. A negative result does not preclude SARS-Cov-2 infection and should not be used as the sole basis for treatment or other patient management decisions. A negative result may occur with  improper specimen collection/handling, submission of specimen other than nasopharyngeal swab, presence of viral mutation(s) within the areas targeted by this assay, and inadequate number of viral copies(<138 copies/mL). A negative result must be combined with clinical observations, patient history, and epidemiological information. The expected result is Negative.  Fact Sheet for Patients:  EntrepreneurPulse.com.au  Fact Sheet for Healthcare Providers:  IncredibleEmployment.be  This test is no t yet approved or cleared by the [place] FDA and  has been authorized for detection and/or diagnosis of SARS-CoV-2 by FDA under an Emergency Use Authorization (EUA). This EUA will remain  in effect (meaning this test can be used) for the duration of the COVID-19 declaration under Section 564(b)(1) of the Act, 21 U.S.C.section 360bbb-3(b)(1), unless the authorization is terminated  or revoked sooner.       Influenza A by PCR NEGATIVE NEGATIVE Final   Influenza B by PCR NEGATIVE NEGATIVE Final    Comment: (NOTE) The Xpert Xpress SARS-CoV-2/FLU/RSV plus assay is intended as an aid in the diagnosis of influenza from Nasopharyngeal swab specimens and should not be used as a sole basis for treatment. Nasal washings and aspirates are unacceptable for Xpert Xpress SARS-CoV-2/FLU/RSV testing.  Fact Sheet for Patients: EntrepreneurPulse.com.au  Fact Sheet for Healthcare Providers: IncredibleEmployment.be  This test is not yet approved or cleared by the [place]  FDA and has been  authorized for detection and/or diagnosis of SARS-CoV-2 by FDA under an Emergency Use Authorization (EUA). This EUA will remain in effect (meaning this test can be used) for the duration of the COVID-19 declaration under Section 564(b)(1) of the Act, 21 U.S.C. section 360bbb-3(b)(1), unless the authorization is terminated or revoked.  Performed at [facility], [address] [place], [place] [address]   Culture, blood (Routine X 2) w Reflex to ID Panel     Status: None   Collection Time: 02/07/20  5:46 PM   Specimen: BLOOD  Result Value Ref Range Status   Specimen Description BLOOD BLOOD RIGHT FOREARM  Final   Special Requests   Final    BOTTLES DRAWN AEROBIC AND ANAEROBIC Blood Culture adequate volume   Culture   Final    NO GROWTH 5 DAYS Performed at [facility], [address] [place], [place] [address]    Report Status 02/12/2020 FINAL  Final  Culture, blood (Routine X 2) w Reflex to ID Panel     Status: None   Collection Time: 02/07/20  5:46 PM   Specimen: BLOOD  Result Value Ref Range Status   Specimen Description BLOOD LEFT ANTECUBITAL  Final   Special Requests   Final    BOTTLES DRAWN AEROBIC AND ANAEROBIC Blood Culture adequate volume   Culture   Final    NO GROWTH 5 DAYS Performed at [facility], [address], [place], [place] [address]    Report Status 02/12/2020 FINAL  Final  Urine Culture     Status: Abnormal   Collection Time: 02/07/20  5:46 PM   Specimen: Urine, Random  Result Value Ref Range Status   Specimen Description   Final    URINE, RANDOM Performed at [facility], [address], [place], [place] [address]    Special Requests   Final    NONE Performed at [facility], [address] [place], [place] [address]    Culture (A)  Final    <10,000 COLONIES/mL INSIGNIFICANT GROWTH Performed at [facility], [place] [address] [place], [place] [address]    Report Status 02/08/2020 FINAL   Final  Aerobic/Anaerobic Culture (surgical/deep wound)     Status: None   Collection Time: 02/09/20  3:00 PM   Specimen: Gallbladder; Abscess  Result Value Ref Range Status   Specimen Description   Final    GALL BLADDER Performed at [facility], [address], [place], [place] [address]    Special Requests   Final    NONE Performed at [facility], [address] [place],  [address]    Gram Stain   Final    ABUNDANT WBC PRESENT, PREDOMINANTLY PMN MODERATE GRAM NEGATIVE RODS    Culture   Final    FEW KLEBSIELLA PNEUMONIAE NO ANAEROBES ISOLATED Performed at [facility], [place] [address], [place],  [address]    Report Status 02/14/2020 FINAL  Final   Organism ID, Bacteria KLEBSIELLA PNEUMONIAE  Final      Susceptibility   Klebsiella pneumoniae - MIC*    AMPICILLIN >=32 RESISTANT Resistant     CEFAZOLIN <=4 SENSITIVE Sensitive     CEFEPIME <=0.12 SENSITIVE Sensitive     CEFTAZIDIME <=1 SENSITIVE Sensitive     CEFTRIAXONE <=0.25 SENSITIVE Sensitive     CIPROFLOXACIN <=0.25 SENSITIVE Sensitive     GENTAMICIN <=1 SENSITIVE Sensitive     IMIPENEM <=0.25 SENSITIVE Sensitive  TRIMETH/SULFA <=20 SENSITIVE Sensitive     AMPICILLIN/SULBACTAM 4 SENSITIVE Sensitive     PIP/TAZO <=4 SENSITIVE Sensitive     * FEW KLEBSIELLA PNEUMONIAE  MRSA PCR Screening     Status: None   Collection Time: 02/10/20 12:00 AM   Specimen: Nasal Mucosa; Nasopharyngeal  Result Value Ref Range Status   MRSA by PCR NEGATIVE NEGATIVE Final    Comment:        The GeneXpert MRSA Assay (FDA approved for NASAL specimens only), is one component of a comprehensive MRSA colonization surveillance program. It is not intended to diagnose MRSA infection nor to guide or monitor treatment for MRSA infections. Performed at [facility], [address], [place], [place] [address]      Radiology Studies: No results found.   LOS: 9 days   Antonieta Pert, MD [facility]  02/16/2020, 7:45 AM

## 2020-02-17 DIAGNOSIS — K810 Acute cholecystitis: Secondary | ICD-10-CM | POA: Diagnosis not present

## 2020-02-17 LAB — GLUCOSE, CAPILLARY
Glucose-Capillary: 227 mg/dL — ABNORMAL HIGH (ref 70–99)
Glucose-Capillary: 246 mg/dL — ABNORMAL HIGH (ref 70–99)
Glucose-Capillary: 199 mg/dL — ABNORMAL HIGH (ref 70–99)
Glucose-Capillary: 138 mg/dL — ABNORMAL HIGH (ref 70–99)

## 2020-02-17 LAB — CBC
HCT: 38.6 % — ABNORMAL LOW (ref 39.0–52.0)
Hemoglobin: 12.8 g/dL — ABNORMAL LOW (ref 13.0–17.0)
MCH: 27.4 pg (ref 26.0–34.0)
MCHC: 33.2 g/dL (ref 30.0–36.0)
MCV: 82.5 fL (ref 80.0–100.0)
Platelets: 303 10*3/uL (ref 150–400)
RBC: 4.68 MIL/uL (ref 4.22–5.81)
RDW: 14.7 % (ref 11.5–15.5)
WBC: 8.6 10*3/uL (ref 4.0–10.5)
nRBC: 0 % (ref 0.0–0.2)

## 2020-02-17 LAB — HEPATIC FUNCTION PANEL
ALT: 345 U/L — ABNORMAL HIGH (ref 0–44)
AST: 77 U/L — ABNORMAL HIGH (ref 15–41)
Albumin: 2.5 g/dL — ABNORMAL LOW (ref 3.5–5.0)
Alkaline Phosphatase: 264 U/L — ABNORMAL HIGH (ref 38–126)
Bilirubin, Direct: 0.3 mg/dL — ABNORMAL HIGH (ref 0.0–0.2)
Indirect Bilirubin: 0.6 mg/dL (ref 0.3–0.9)
Total Bilirubin: 0.9 mg/dL (ref 0.3–1.2)
Total Protein: 6.2 g/dL — ABNORMAL LOW (ref 6.5–8.1)

## 2020-02-17 LAB — BASIC METABOLIC PANEL
Anion gap: 10 (ref 5–15)
BUN: 20 mg/dL (ref 8–23)
CO2: 25 mmol/L (ref 22–32)
Calcium: 9.6 mg/dL (ref 8.9–10.3)
Chloride: 105 mmol/L (ref 98–111)
Creatinine, Ser: 0.95 mg/dL (ref 0.61–1.24)
GFR, Estimated: >60 mL/min (ref >60)
Glucose, Bld: 241 mg/dL — ABNORMAL HIGH (ref 70–99)
Potassium: 3.3 mmol/L — ABNORMAL LOW (ref 3.5–5.1)
Sodium: 140 mmol/L (ref 135–145)

## 2020-02-17 MED ORDER — POTASSIUM CHLORIDE CRYS ER 20 MEQ PO TBCR
40.0000 meq | EXTENDED_RELEASE_TABLET | Freq: Once | ORAL | Status: AC
  Administered 2020-02-17: 09:00:00 40 meq via ORAL
  Filled 2020-02-17: qty 2

## 2020-02-17 MED ORDER — TRAMADOL HCL 50 MG PO TABS
50.0000 mg | ORAL_TABLET | Freq: Four times a day (QID) | ORAL | Status: DC | PRN
  Administered 2020-02-17 – 2020-02-18 (×4): 50 mg via ORAL
  Filled 2020-02-17 (×4): qty 1

## 2020-02-17 MED ORDER — ACETAMINOPHEN 325 MG PO TABS: 650.0000 mg | ORAL_TABLET | Freq: Four times a day (QID) | ORAL | Status: DC | PRN

## 2020-02-17 NOTE — TOC Progression Note (Signed)
Transition of Care [facility]) - Progression Note    Patient Details  Name: SHINICHI ANGUIANO MRN: [id number] Date of Birth: [birth year]  Transition of Care [facility]) CM/SW [place], RN Phone Number: 02/17/2020, 9:37 AM  Clinical Narrative:   RNCM met with patient at bedside per his request. Patient reports that it is still his plan to go home but that he would like to try adult diapers while here. He reports he thinks this would make things much easier for his wife when he does go home. Explained that this is not this CM's role but that the information would be passed along to his nurse. RNCM informed charge nurse of patient's request.     Expected Discharge Plan: [place] Barriers to Discharge: Continued Medical Work up  Expected Discharge Plan and Services Expected Discharge Plan: [place] arrangements for the past 2 months: [place] Arranged: RN,PT,OT,Nurse's Aide [place]: [place] ([place]) Date [place]: 02/15/20   Representative spoke with at [place]: Floydene Flock   Social Determinants of Health (SDOH) Interventions    Readmission Risk Interventions No flowsheet data found.

## 2020-02-17 NOTE — TOC Progression Note (Addendum)
Transition of Care [facility]) - Progression Note    Patient Details  Name: DEUNTA BENEKE MRN: [id number] Date of Birth: Nov 25, 1955  Transition of Care [facility]) CM/SW [place], LCSW Phone Number: 02/17/2020, 11:05 AM  Clinical Narrative:   Patient and wife reporting patient may be willing to go to SNF, but only if it is [facility]. Patient is vaccinated for COVID. CSW completed PASRR and sent FL2 to [facility]. Reached out to Admissions Worker Seth Bake to review referral. She reported they are not able to accept patient due to limited staffing.    Expected Discharge Plan: [place] Barriers to Discharge: Continued Medical Work up  Expected Discharge Plan and Services Expected Discharge Plan: [place] arrangements for the past 2 months: [place] Arranged: RN,PT,OT,Nurse's Aide [place]: [place] ([place]) Date [place]: 02/15/20   Representative spoke with at [place]: Floydene Flock   Social Determinants of Health (SDOH) Interventions    Readmission Risk Interventions No flowsheet data found.

## 2020-02-17 NOTE — Progress Notes (Signed)
PROGRESS NOTE    Kenneth Osborn  EHU:[id number] DOB: 11-23-1955 DOA: 02/07/2020 PCP: [name], MD   Chief Complaint  Patient presents with  . Chest Pain    Brief Narrative: 64 year old male with history of CAD with a stent angioplasty, COPD, chronic low back pain, GERD, hypertension diabetes mellitus with stage III kidney disease, Mnire's disease presented to the ED for chest pain, he felt she may have covid.  Work-up in the ED showed hyperglycemia, anion gap metabolic acidosis elevated LFTs and bilirubin, lactic acidosis ultrasound showed gallbladder wall sludge with finding representing sequelae of acalculous cholecystitis, CT of the abdomen with mild laziness of the gallbladder wall and pericholecystic fat, no calcified stone, 5.6X5.9 heterogeneously enhancing mass in the upper pole of the left kidney most concerning for malignancy.  Patient was admitted underwent cholecystostomy tube placement but continued to deteriorate requiring increased oxygen and IV fluids and unresponsive with severe acute hypoxic respiratory failure and circulatory shock/septic shock in the setting of cholecystitis.  He was managed in ICU.  Patient was subsequently stabilized, extubated 12/27 and has been off pressors Transferred to medical for 12/28.  PT OT has been working with him and has advised skilled nursing facility which patient has refused  Subjective:  Having BM no nausea or vomiting. Felt cold and mild confused last night. On RA wife at bedside some RUQ abd pain, drain bloody but lighter Wife preferring SNF  Assessment & Plan:  Acute acalculous cholecystitis: Seen by surgery s/p cholecystostomy tube with frank pus and some serosanguineous drain.  Has been on empiric Zosyn-and on discharge will switch to Augmentin to complete 2 weeks course based on culture sensitivity - drain grew Klebsiella .Patient will need outpatient close follow-up and cholecystectomy care. Still having bloody output  although somewhat lighter , hemoglobin has been stable. Lovenox sq discontinued 2/29 , is on effient. Hopefully drain will improve further. Continue flush with 5 mill NS daily.  Abnormal LFTs in the setting of cholecystitis/sepsis/shock-LFTs improving AST significantly better today  Cont to monitor cholecystostomy drain and monitor LFTs as OP. Marland Kitchen Cont to hold statin.  Acute hepatitis panel negative. Recent Labs  Lab 02/11/20 0423 02/12/20 0529 02/15/20 0627 02/16/20 0549 02/17/20 0406  AST 42*  --  566* 395* 77*  ALT 222*  --  260* 574* 345*  ALKPHOS 177*  --  334* 328* 264*  BILITOT 1.2  --  2.5* 1.3* 0.9  PROT 6.1*  --  6.6 6.3* 6.2*  ALBUMIN 2.5* 2.3* 2.7* 2.5* 2.5*   Type 2 diabetes mellitus with uncontrolled hyperglycemia, poorly controlled hemoglobin A1c 8.2.  Patient is on U500-40 units 3 times daily ( but he takes 70-80 utnies tiad based on sugar level), also on metformin at home, can resumhome regimen on d/c.  Continue Lantus 20 units twice daily and Premeal NovoLog 5 units 3 times daily and sliding scale.   Recent Labs  Lab 02/16/20 0744 02/16/20 1152 02/16/20 1658 02/16/20 2123 02/17/20 0751  GLUCAP 346* 328* 247* 237* 227*   Lab Results  Component Value Date   HGBA1C 8.2 (H) 02/07/2020   Sepsis with septic shock present on admission, blood pressure is stabilized off pressors.  Klebsiella pneumonia grew on the cholecystostomy drain, blood culture no growth.  Being appropriately treated antibiotics.  AKI due to septic shock on CKD (chronic kidney disease) stage IIIa:Baseline creatinine ~ 1.4 in 01/08/2019.  Renal function stable. off Foley.  Hypokalemia we will replete with oral potassium again  Recent Labs  Lab 02/13/20 0528 02/14/20 0425 02/15/20 0627 02/16/20 0549 02/17/20 0406  K 4.0 3.2* 3.3* 3.4* 3.3*   Left Renal mass seen by urology, will need close follow-up and intervention-will need outpatient follow-up and surgery in conjunction with general surgery  team gallbladder and kidney okay to remove Foley as per urology 12/28  CAD/history of stent, continue Imdur, metoprolol, Effient.  Currently holding statin due to elevated LFTs for now.  No chest pain  Benign essential HTN: On clonidine patch-weaned off and resume home Cardizem, Vasotec.  Blood pressure overall stable.  Morbid obesity with BMI 33, will benefit with weight loss and healthy lifestyle.  OSA continue CPAP as tolerated  Nutrition: Diet Order            Diet heart healthy/carb modified Room service appropriate? Yes; Fluid consistency: Thin  Diet effective now                 Nutrition Problem: Inadequate oral intake Etiology: inability to eat (pt sedated and ventilated) Signs/Symptoms: NPO status    Body mass index is 33.23 kg/m.   DVT prophylaxis: Place and maintain sequential compression device Start: 02/16/20 1151lovenox-will hold due to bloody output on the drain and add SCDs Code Status:   Code Status: Full Code  Family Communication: plan of care discussed with patient and his wife at bedside.  Status is: Inpatient  Remains inpatient appropriate because:IV treatments appropriate due to intensity of illness or inability to take PO and Inpatient level of care appropriate due to severity of illness  Dispo: The patient is from: Home              Anticipated d/c is to: Skilled nursing facility advised, wife and patient both agreeable and can consider [facility] for SNF, TOC alerted.  If unable, he will likely go home with home health             Anticipated d/c date is: 1-2 days              Patient currently is not medically stable to d/c.  Consultants:see note  Procedures:see note  Culture/Microbiology    Component Value Date/Time   SDES  02/09/2020 1500    GALL BLADDER Performed at [facility], [address] [place], [place] [address]    [facility]  02/09/2020 1500    NONE Performed at [facility], [address]  [place], [place] [address]    CULT  02/09/2020 1500    FEW KLEBSIELLA PNEUMONIAE NO ANAEROBES ISOLATED Performed at [facility], [place] [address], [place], [place] [address]    REPTSTATUS 02/14/2020 FINAL 02/09/2020 1500    Other culture-see note  Medications: Scheduled Meds: . ALPRAZolam  0.5 mg Oral QHS  . diltiazem  240 mg Oral Daily  . enalapril  10 mg Oral BID  . insulin aspart  0-15 Units Subcutaneous TID WC  . insulin aspart  0-5 Units Subcutaneous QHS  . insulin aspart  5 Units Subcutaneous TID WC  . insulin glargine  20 Units Subcutaneous BID  . isosorbide mononitrate  30 mg Oral Daily  . loratadine  10 mg Oral Daily  . metoprolol succinate  100 mg Oral BID  . montelukast  10 mg Oral QHS  . multivitamin with minerals  1 tablet Oral Daily  . pantoprazole  40 mg Oral BID  . prasugrel  10 mg Oral Daily  . Ensure Max Protein  11 oz Oral BID   Continuous Infusions: .  sodium chloride 250 mL (02/16/20 1303)    Antimicrobials: Anti-infectives (From admission, onward)   Start     Dose/Rate Route Frequency Ordered Stop   02/08/20 1800  cefTRIAXone (ROCEPHIN) 2 g in sodium chloride 0.9 % 100 mL IVPB  Status:  Discontinued        2 g 200 mL/hr over 30 Minutes Intravenous Every 24 hours 02/07/20 2230 02/07/20 2300   02/07/20 2330  piperacillin-tazobactam (ZOSYN) IVPB 3.375 g        3.375 g 12.5 mL/hr over 240 Minutes Intravenous Every 8 hours 02/07/20 2230 02/16/20 2359   02/07/20 2230  metroNIDAZOLE (FLAGYL) IVPB 500 mg  Status:  Discontinued        500 mg 100 mL/hr over 60 Minutes Intravenous  Once 02/07/20 2223 02/07/20 2259   02/07/20 1930  cefTRIAXone (ROCEPHIN) 2 g in sodium chloride 0.9 % 100 mL IVPB        2 g 200 mL/hr over 30 Minutes Intravenous  Once 02/07/20 1918 02/07/20 2101     Objective: Vitals: Today's Vitals   02/16/20 1954 02/17/20 0001 02/17/20 0103 02/17/20 0741  BP: (!) 147/70 139/71  (!) 165/82  Pulse: 63 66  62  Resp: 19 18  16   Temp: 98.1  F (36.7 C) 98 F (36.7 C)  98.4 F (36.9 C)  TempSrc:      SpO2: 98% 95%  96%  Weight:      Height:      PainSc:   0-No pain 0-No pain    Intake/Output Summary (Last 24 hours) at 02/17/2020 1053 Last data filed at 02/17/2020 1014 Gross per 24 hour  Intake 120 ml  Output 150 ml  Net -30 ml   Filed Weights   02/07/20 1645  Weight: 102.1 kg   Weight change:   Intake/Output from previous day: 12/29 0701 - 12/30 0700 In: 0  Out: 150 [Drains:150] Intake/Output this shift: Total I/O In: 120 [P.O.:120] Out: -   Examination: General exam: AAOx3, obese, NAD, weak appearing. HEENT:Oral mucosa moist, Ear/Nose WNL grossly, dentition normal. Respiratory system: bilaterally clear,no wheezing or crackles,no use of accessory muscle Cardiovascular system: S1 & S2 +, No JVD,. Gastrointestinal system: Abdomen soft, NT,ND, BS+.  Right upper quadrant w/ cholecystostomy drain bloody output somewhat lighter. Nervous System:Alert, awake, moving extremities and grossly nonfocal Extremities: No edema, distal peripheral pulses palpable.  Skin: No rashes,no icterus. MSK: Normal muscle bulk,tone, power  Data Reviewed: I have personally reviewed following labs and imaging studies CBC: Recent Labs  Lab 02/12/20 0529 02/13/20 0528 02/14/20 0425 02/15/20 0627 02/16/20 0549 02/17/20 0406  WBC 7.0 7.5 10.2 9.4 7.0 8.6  NEUTROABS 4.1  --   --   --   --   --   HGB 11.7* 13.2 13.5 13.0 12.6* 12.8*  HCT 35.7* 39.3 40.4 39.4 38.0* 38.6*  MCV 83.6 82.0 82.6 83.1 82.8 82.5  PLT 221 261 319 316 301 614   Basic Metabolic Panel: Recent Labs  Lab 02/12/20 0529 02/13/20 0528 02/14/20 0425 02/15/20 0627 02/16/20 0549 02/17/20 0406  NA 140 142 144 143 141 140  K 3.6 4.0 3.2* 3.3* 3.4* 3.3*  CL 104 105 105 107 106 105  CO2 25 27 29 24 25 25   GLUCOSE 261* 309* 218* 237* 263* 241*  BUN 25* 28* 30* 27* 24* 20  CREATININE 0.88 0.94 0.85 0.87 0.91 0.95  CALCIUM 9.3 9.5 9.9 9.8 9.6 9.6  MG 1.6*  1.8 2.0 1.9 1.7  --   PHOS  3.3 2.9 2.6  --   --   --    GFR: Estimated Creatinine Clearance: 91.3 mL/min (by C-G formula based on SCr of 0.95 mg/dL). Liver Function Tests: Recent Labs  Lab 02/11/20 0423 02/12/20 0529 02/15/20 0627 02/16/20 0549 02/17/20 0406  AST 42*  --  566* 395* 77*  ALT 222*  --  260* 574* 345*  ALKPHOS 177*  --  334* 328* 264*  BILITOT 1.2  --  2.5* 1.3* 0.9  PROT 6.1*  --  6.6 6.3* 6.2*  ALBUMIN 2.5* 2.3* 2.7* 2.5* 2.5*   No results for input(s): LIPASE, AMYLASE in the last 168 hours. No results for input(s): AMMONIA in the last 168 hours. Coagulation Profile: No results for input(s): INR, PROTIME in the last 168 hours. Cardiac Enzymes: No results for input(s): CKTOTAL, CKMB, CKMBINDEX, TROPONINI in the last 168 hours. BNP (last 3 results) No results for input(s): PROBNP in the last 8760 hours. HbA1C: No results for input(s): HGBA1C in the last 72 hours. CBG: Recent Labs  Lab 02/16/20 0744 02/16/20 1152 02/16/20 1658 02/16/20 2123 02/17/20 0751  GLUCAP 346* 328* 247* 237* 227*   Lipid Profile: No results for input(s): CHOL, HDL, LDLCALC, TRIG, CHOLHDL, LDLDIRECT in the last 72 hours. Thyroid Function Tests: No results for input(s): TSH, T4TOTAL, FREET4, T3FREE, THYROIDAB in the last 72 hours. Anemia Panel: No results for input(s): VITAMINB12, FOLATE, FERRITIN, TIBC, IRON, RETICCTPCT in the last 72 hours. Sepsis Labs: No results for input(s): PROCALCITON, LATICACIDVEN in the last 168 hours.  Recent Results (from the past 240 hour(s))  Resp Panel by RT-PCR (Flu A&B, Covid) Nasopharyngeal Swab     Status: None   Collection Time: 02/07/20  5:46 PM   Specimen: Nasopharyngeal Swab; Nasopharyngeal(NP) swabs in vial transport medium  Result Value Ref Range Status   SARS Coronavirus 2 by RT PCR NEGATIVE NEGATIVE Final    Comment: (NOTE) SARS-CoV-2 target nucleic acids are NOT DETECTED.  The SARS-CoV-2 RNA is generally detectable in upper  respiratory specimens during the acute phase of infection. The lowest concentration of SARS-CoV-2 viral copies this assay can detect is 138 copies/mL. A negative result does not preclude SARS-Cov-2 infection and should not be used as the sole basis for treatment or other patient management decisions. A negative result may occur with  improper specimen collection/handling, submission of specimen other than nasopharyngeal swab, presence of viral mutation(s) within the areas targeted by this assay, and inadequate number of viral copies(<138 copies/mL). A negative result must be combined with clinical observations, patient history, and epidemiological information. The expected result is Negative.  Fact Sheet for Patients:  EntrepreneurPulse.com.au  Fact Sheet for Healthcare Providers:  IncredibleEmployment.be  This test is no t yet approved or cleared by the [place] FDA and  has been authorized for detection and/or diagnosis of SARS-CoV-2 by FDA under an Emergency Use Authorization (EUA). This EUA will remain  in effect (meaning this test can be used) for the duration of the COVID-19 declaration under Section 564(b)(1) of the Act, 21 U.S.C.section 360bbb-3(b)(1), unless the authorization is terminated  or revoked sooner.       Influenza A by PCR NEGATIVE NEGATIVE Final   Influenza B by PCR NEGATIVE NEGATIVE Final    Comment: (NOTE) The Xpert Xpress SARS-CoV-2/FLU/RSV plus assay is intended as an aid in the diagnosis of influenza from Nasopharyngeal swab specimens and should not be used as a sole basis for treatment. Nasal washings and aspirates are unacceptable for Xpert Xpress SARS-CoV-2/FLU/RSV testing.  Fact Sheet for Patients: EntrepreneurPulse.com.au  Fact Sheet for Healthcare Providers: IncredibleEmployment.be  This test is not yet approved or cleared by the [place] FDA and has been  authorized for detection and/or diagnosis of SARS-CoV-2 by FDA under an Emergency Use Authorization (EUA). This EUA will remain in effect (meaning this test can be used) for the duration of the COVID-19 declaration under Section 564(b)(1) of the Act, 21 U.S.C. section 360bbb-3(b)(1), unless the authorization is terminated or revoked.  Performed at [facility], [address] [place], [place] [address]   Culture, blood (Routine X 2) w Reflex to ID Panel     Status: None   Collection Time: 02/07/20  5:46 PM   Specimen: BLOOD  Result Value Ref Range Status   Specimen Description BLOOD BLOOD RIGHT FOREARM  Final   Special Requests   Final    BOTTLES DRAWN AEROBIC AND ANAEROBIC Blood Culture adequate volume   Culture   Final    NO GROWTH 5 DAYS Performed at [facility], [address] [place], [place] [address]    Report Status 02/12/2020 FINAL  Final  Culture, blood (Routine X 2) w Reflex to ID Panel     Status: None   Collection Time: 02/07/20  5:46 PM   Specimen: BLOOD  Result Value Ref Range Status   Specimen Description BLOOD LEFT ANTECUBITAL  Final   Special Requests   Final    BOTTLES DRAWN AEROBIC AND ANAEROBIC Blood Culture adequate volume   Culture   Final    NO GROWTH 5 DAYS Performed at [facility], [address], [place], [place] [address]    Report Status 02/12/2020 FINAL  Final  Urine Culture     Status: Abnormal   Collection Time: 02/07/20  5:46 PM   Specimen: Urine, Random  Result Value Ref Range Status   Specimen Description   Final    URINE, RANDOM Performed at [facility], [address], [place], [place] [address]    Special Requests   Final    NONE Performed at [facility], [address] [place], [place] [address]    Culture (A)  Final    <10,000 COLONIES/mL INSIGNIFICANT GROWTH Performed at [facility], [place] [address], [place], [place] [address]    Report Status 02/08/2020 FINAL   Final  Aerobic/Anaerobic Culture (surgical/deep wound)     Status: None   Collection Time: 02/09/20  3:00 PM   Specimen: Gallbladder; Abscess  Result Value Ref Range Status   Specimen Description   Final    GALL BLADDER Performed at [facility], [address], [place], [place] [address]    Special Requests   Final    NONE Performed at [facility], [address] [place], [place] [address]    Gram Stain   Final    ABUNDANT WBC PRESENT, PREDOMINANTLY PMN MODERATE GRAM NEGATIVE RODS    Culture   Final    FEW KLEBSIELLA PNEUMONIAE NO ANAEROBES ISOLATED Performed at [facility], [place] [address], [place], [place] [address]    Report Status 02/14/2020 FINAL  Final   Organism ID, Bacteria KLEBSIELLA PNEUMONIAE  Final      Susceptibility   Klebsiella pneumoniae - MIC*    AMPICILLIN >=32 RESISTANT Resistant     CEFAZOLIN <=4 SENSITIVE Sensitive     CEFEPIME <=0.12 SENSITIVE Sensitive     CEFTAZIDIME <=1 SENSITIVE Sensitive     CEFTRIAXONE <=0.25 SENSITIVE Sensitive  CIPROFLOXACIN <=0.25 SENSITIVE Sensitive     GENTAMICIN <=1 SENSITIVE Sensitive     IMIPENEM <=0.25 SENSITIVE Sensitive     TRIMETH/SULFA <=20 SENSITIVE Sensitive     AMPICILLIN/SULBACTAM 4 SENSITIVE Sensitive     PIP/TAZO <=4 SENSITIVE Sensitive     * FEW KLEBSIELLA PNEUMONIAE  MRSA PCR Screening     Status: None   Collection Time: 02/10/20 12:00 AM   Specimen: Nasal Mucosa; Nasopharyngeal  Result Value Ref Range Status   MRSA by PCR NEGATIVE NEGATIVE Final    Comment:        The GeneXpert MRSA Assay (FDA approved for NASAL specimens only), is one component of a comprehensive MRSA colonization surveillance program. It is not intended to diagnose MRSA infection nor to guide or monitor treatment for MRSA infections. Performed at [facility], [address] [place], [place] [address]      Radiology Studies: No results found.   LOS: 10 days   Antonieta Pert,  MD [facility]  02/17/2020, 10:53 AM

## 2020-02-17 NOTE — NC FL2 (Signed)
[place] LEVEL OF CARE SCREENING TOOL     IDENTIFICATION  Patient Name: Kenneth Osborn Birthdate: 1955/02/26 Sex: male Admission Date (Current Location): 02/07/2020  [place] and [place] Number:  Engineering geologist and Address:  [facility], [address] [place], [place] [address]      Provider Number: [id number]  Attending Physician Name and Address:  Antonieta Pert, MD  Relative Name and Phone Number:  Daviel, [place] (Spouse)   [phone] (Home Phone)    Current Level of Care: Hospital Recommended Level of Care: [place] Prior Approval Number:    Date Approved/Denied:   PASRR Number: [id number]  Discharge Plan: SNF    Current Diagnoses: Patient Active Problem List   Diagnosis Date Noted  . Elevated LFTs   . Renal mass   . Hypokalemia   . Acute acalculous cholecystitis 02/07/2020  . Severe sepsis ([place]) 02/07/2020  . Chronic low back pain (Bilateral) w/o sciatica 12/16/2019  . Hyperglycemia 12/16/2019  . Pharmacologic therapy 08/03/2019  . Disorder of skeletal system 08/03/2019  . Problems influencing health status 08/03/2019  . Postop check 12/17/2018  . Acute postoperative pain 12/03/2018  . DDD (degenerative disc disease), lumbosacral 09/21/2018  . Diabetic polyneuropathy associated with diabetes mellitus due to underlying condition ([place]) 09/10/2018  . Neuropathic pain 09/10/2018  . Musculoskeletal pain 09/10/2018  . AKI (acute kidney injury) ([place]) 02/19/2018  . Frequent falls 02/07/2018  . Hyperkalemia 01/18/2018  . Spondylosis without myelopathy or radiculopathy, lumbosacral region 11/13/2017  . Cardiac syncope 09/08/2017  . History of coronary artery disease 09/03/2017  . Spinal cord stimulator dysfunction ([place]) 12/26/2016  . Trigger point posterior superior iliac spine (PSIS) (Right) 12/25/2016  . 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 Polyneuropathy 12/18/2016  . Numbness  and tingling 11/12/2016  . Tachycardia with heart rate 100-120 beats per minute 11/12/2016  . Meniere disease, bilateral 11/12/2016  . Headache disorder 11/12/2016  . Type 2 diabetes mellitus with diabetic neuropathy ([place]) 11/12/2016  . Type 2 diabetes mellitus with diabetic polyneuropathy, with long-term current use of insulin ([place]) 11/12/2016  . Tremor 09/11/2016  . Chronic pain syndrome 05/28/2016  . Bilateral carotid artery stenosis 01/24/2016  . Mild cognitive impairment 11/27/2015  . 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 Balance problem 10/09/2015  . Chronic tension-type headache, not intractable 10/09/2015  . Dizziness 10/09/2015  . Post-concussion headache 10/09/2015  . PSVT (paroxysmal supraventricular tachycardia) ([place]) 08/31/2015  . Diabetes mellitus, type II ([place]) 06/14/2015  . Unstable angina ([place]) 06/08/2015  . Encounter for interrogation of neurostimulator 03/22/2015  . [employer] spinal cord stimulator (implant date: 11/01/2014) 03/22/2015  . Type 2 diabetes mellitus with hyperglycemia ([place]) 03/15/2015  . Uncontrolled type 2 diabetes mellitus with hyperglycemia, with long-term current use of insulin ([place]) 03/15/2015  . DM type 2 with diabetic peripheral neuropathy ([place]) 03/15/2015  . Acid reflux 12/21/2014  . OSA (obstructive sleep apnea) 12/21/2014  . S/P insertion of spinal cord stimulator 12/21/2014  . Chronic low back pain (Bilateral) (L>R) w/ sciatica (Left) 12/21/2014  . 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 Beverely Pace at [facility] on 01/04/2013) 12/21/2014  . Long term current use of opiate analgesic 12/21/2014  . Long term prescription opiate use 12/21/2014  . Opiate use (60 MME/Day) 12/21/2014  . Opiate dependence ([place]) 12/21/2014  . Encounter for therapeutic drug level monitoring 12/21/2014  . Neurogenic pain 12/21/2014  . Thoracic facet syndrome (T8-10) 12/21/2014  .  Lumbar facet syndrome (Bilateral) (R>L)  12/21/2014  . Cervical facet syndrome (Right) 12/21/2014  . Cervical spondylosis 12/21/2014  . Lumbar spondylosis 12/21/2014  . Chronic upper extremity pain (Left) 12/21/2014  . Chronic cervical radicular pain (Left) 12/21/2014  . Chronic upper back pain 12/21/2014  . History of thoracic spine surgery (S/P T9-10 IVD spacer) 12/21/2014  . 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 Chronic musculoskeletal pain 12/21/2014  . Myofascial pain 12/21/2014  . Chronic lower extremity pain (Left) 12/21/2014  . Chronic radicular lumbar pain (left L4 dermatomal pain) 12/21/2014  . Coronary artery disease 12/21/2014  . History of MI (myocardial infarction) (May 2012) 12/21/2014  . Chronic anticoagulation ([place]) 12/21/2014  . History of Meniere's disease 12/21/2014  . Sleep apnea 12/21/2014  . History of spinal surgery 11/14/2014  . CKD (chronic kidney disease) stage 3, GFR 30-59 ml/min (HCC) 09/26/2014  . Hypotension due to hypovolemia 09/22/2014  . Benign essential HTN 09/14/2014  . Hypertension associated with diabetes ([place]) 09/14/2014  . Barrett esophagus 08/12/2014  . H/O adenomatous polyp of colon 08/12/2014  . Dysphagia 08/12/2014  . GERD (gastroesophageal reflux disease) 01/25/2014  . Arteriosclerosis of coronary artery 08/13/2013  . Hyperlipidemia 08/13/2013  . CAD (coronary artery disease) 08/13/2013  . Hyperlipidemia associated with type 2 diabetes mellitus ([place]) 08/13/2013  . Auditory vertigo 12/30/2012  . Cervical spinal stenosis 12/11/2012  . Arthralgia of shoulder 03/16/2012    Orientation RESPIRATION BLADDER Height & Weight     Self,Time,Situation,Place  Normal Incontinent Weight: 225 lb (102.1 kg) Height:  5' 9"  (175.3 cm)  BEHAVIORAL SYMPTOMS/MOOD NEUROLOGICAL BOWEL NUTRITION STATUS      Incontinent Diet (heart healthy/carb modified)  AMBULATORY STATUS COMMUNICATION OF NEEDS Skin   Extensive Assist   Normal                       Personal Care Assistance  Level of Assistance  Bathing,Feeding,Dressing Bathing Assistance: Maximum assistance Feeding assistance: Independent Dressing Assistance: Maximum assistance     Functional Limitations Info             SPECIAL CARE FACTORS FREQUENCY  PT (By licensed PT),OT (By licensed OT)     PT Frequency: 5 x/week OT Frequency: 5 x/week            Contractures      Additional Factors Info  Code Status,Allergies Code Status Info: full Allergies Info: nka           Current Medications (02/17/2020):  This is the current hospital active medication list Current Facility-Administered Medications  Medication Dose Route Frequency Provider Last Rate Last Admin  . 0.9 %  sodium chloride infusion   Intravenous PRN Shawna Clamp, MD 10 mL/hr at 02/16/20 1303 250 mL at 02/16/20 1303  . acetaminophen (TYLENOL) 160 MG/5ML solution 1,000 mg  1,000 mg Oral TID PRN Rust-Chester, Toribio Harbour L, NP   1,000 mg at 02/16/20 1717  . ALPRAZolam Duanne Moron) tablet 0.5 mg  0.5 mg Oral QHS Dallie Piles, RPH   0.5 mg at 02/16/20 2246  . diltiazem (CARDIZEM CD) 24 hr capsule 240 mg  240 mg Oral Daily Kc, Ramesh, MD   240 mg at 02/17/20 0903  . enalapril (VASOTEC) tablet 10 mg  10 mg Oral BID Kc, Ramesh, MD   10 mg at 02/17/20 0911  . hydrALAZINE (APRESOLINE) injection 10 mg  10 mg Intravenous Q6H PRN Rust-Chester, Britton L, NP      . insulin aspart (novoLOG) injection 0-15 Units  0-15 Units Subcutaneous TID WC Antonieta Pert, MD   5 Units at 02/16/20 1716  . insulin aspart (novoLOG) injection 0-5 Units  0-5 Units Subcutaneous QHS Antonieta Pert, MD   2 Units at 02/16/20 2258  . insulin aspart (novoLOG) injection 5 Units  5 Units Subcutaneous TID WC Antonieta Pert, MD   5 Units at 02/17/20 0905  . insulin glargine (LANTUS) injection 20 Units  20 Units Subcutaneous BID Antonieta Pert, MD   20 Units at 02/17/20 0904  . isosorbide mononitrate (IMDUR) 24 hr tablet 30 mg  30 mg Oral Daily Loletha Grayer, MD   30 mg at 02/17/20 0904  .  loratadine (CLARITIN) tablet 10 mg  10 mg Oral Daily Loletha Grayer, MD   10 mg at 02/17/20 0903  . metoprolol succinate (TOPROL-XL) 24 hr tablet 100 mg  100 mg Oral BID Loletha Grayer, MD   100 mg at 02/16/20 2247  . montelukast (SINGULAIR) tablet 10 mg  10 mg Oral QHS Loletha Grayer, MD   10 mg at 02/16/20 2247  . multivitamin with minerals tablet 1 tablet  1 tablet Oral Daily Tyna Jaksch, MD   1 tablet at 02/17/20 0160  . nitroGLYCERIN (NITROSTAT) SL tablet 0.4 mg  0.4 mg Sublingual Q5 min PRN Agbata, Tochukwu, MD      . ondansetron (ZOFRAN) tablet 4 mg  4 mg Oral Q6H PRN Rust-Chester, Toribio Harbour L, NP       Or  . ondansetron (ZOFRAN) injection 4 mg  4 mg Intravenous Q6H PRN Rust-Chester, Britton L, NP   4 mg at 02/14/20 2113  . pantoprazole (PROTONIX) EC tablet 40 mg  40 mg Oral BID Loletha Grayer, MD   40 mg at 02/17/20 0904  . prasugrel (EFFIENT) tablet 10 mg  10 mg Oral Daily Loletha Grayer, MD   10 mg at 02/17/20 0911  . protein supplement (ENSURE MAX) liquid  11 oz Oral BID Tyna Jaksch, MD   11 oz at 02/17/20 1093     Discharge Medications: Please see discharge summary for a list of discharge medications.  Relevant Imaging Results:  Relevant Lab Results:   Additional Information SS#: [place], LCSW

## 2020-02-18 DIAGNOSIS — K810 Acute cholecystitis: Secondary | ICD-10-CM | POA: Diagnosis not present

## 2020-02-18 LAB — GLUCOSE, CAPILLARY
Glucose-Capillary: 224 mg/dL — ABNORMAL HIGH (ref 70–99)
Glucose-Capillary: 244 mg/dL — ABNORMAL HIGH (ref 70–99)

## 2020-02-18 LAB — CBC
HCT: 39.3 % (ref 39.0–52.0)
Hemoglobin: 13 g/dL (ref 13.0–17.0)
MCH: 27.6 pg (ref 26.0–34.0)
MCHC: 33.1 g/dL (ref 30.0–36.0)
MCV: 83.4 fL (ref 80.0–100.0)
Platelets: 283 10*3/uL (ref 150–400)
RBC: 4.71 MIL/uL (ref 4.22–5.81)
RDW: 14.6 % (ref 11.5–15.5)
WBC: 10.1 10*3/uL (ref 4.0–10.5)
nRBC: 0 % (ref 0.0–0.2)

## 2020-02-18 LAB — BASIC METABOLIC PANEL
Anion gap: 7 (ref 5–15)
BUN: 18 mg/dL (ref 8–23)
CO2: 25 mmol/L (ref 22–32)
Calcium: 9.5 mg/dL (ref 8.9–10.3)
Chloride: 107 mmol/L (ref 98–111)
Creatinine, Ser: 0.81 mg/dL (ref 0.61–1.24)
GFR, Estimated: >60 mL/min (ref >60)
Glucose, Bld: 243 mg/dL — ABNORMAL HIGH (ref 70–99)
Potassium: 3.8 mmol/L (ref 3.5–5.1)
Sodium: 139 mmol/L (ref 135–145)

## 2020-02-18 MED ORDER — AMOXICILLIN-POT CLAVULANATE 875-125 MG PO TABS: 1.0000 | ORAL_TABLET | Freq: Two times a day (BID) | ORAL | 0 refills | Status: DC

## 2020-02-18 MED ORDER — SODIUM CHLORIDE 0.9 % IJ SOLN: INTRAMUSCULAR | 0 refills | Status: DC

## 2020-02-18 NOTE — Progress Notes (Signed)
Patient refused morning vitals

## 2020-02-18 NOTE — Care Management Important Message (Signed)
Important Message  Patient Details  Name: Kenneth Osborn MRN: [id number] Date of Birth: June 17, 1955   Medicare Important Message Given:  Yes     Juliann Pulse A Hezakiah Champeau 02/18/2020, 10:40 AM

## 2020-02-18 NOTE — Progress Notes (Signed)
Inpatient Diabetes Program Recommendations  AACE/ADA: New Consensus Statement on Inpatient Glycemic Control (2015)  Target Ranges:  Prepandial:   less than 140 mg/dL      Peak postprandial:   less than 180 mg/dL (1-2 hours)      Critically ill patients:  140 - 180 mg/dL   Results for JIHAD, BROWNLOW" (MRN [id number]) as of 02/18/2020 08:44  Ref. Range 02/17/2020 07:51 02/17/2020 12:09 02/17/2020 16:30 02/17/2020 21:29  Glucose-Capillary Latest Ref Range: 70 - 99 mg/dL 227 (H) 246 (H) 199 (H) 138 (H)   Results for VIRGAL, WARMUTH" (MRN [id number]) as of 02/18/2020 08:44  Ref. Range 02/18/2020 07:44  Glucose-Capillary Latest Ref Range: 70 - 99 mg/dL 224 (H)   Home DM Meds: Concentrated U500 Regular Insulin 80 units with Breakfast/ 80 units with Lunch/ 70 units with Dinner Metformin 1000 mg BID  Current Orders: Lantus 20 units BID       Novolog 0-15 units ac/hs          Novolog 5 units TID with meals    Endocrinologist: Dr. Honor Junes with [facility]--last seen 11/11/2019--Was told to change his U500 Insulin to the following:  90 units with Breakfast/ 70 units with Lunch/ 65 units with Dinner    MD- Note AM CBG still >200 this AM  Please consider increasing Lantus to 25 units BID    --Will follow patient during hospitalization--  Wyn Quaker RN, MSN, CDE Diabetes Coordinator Inpatient Glycemic Control Team Team Pager: [phone] (8a-5p)

## 2020-02-18 NOTE — Discharge Summary (Addendum)
Physician Discharge Summary  Kenneth Osborn UDJ:[id number] DOB: 10-Apr-1955 DOA: 02/07/2020  PCP: [name], MD  Admit date: 02/07/2020 Discharge date: 02/18/2020  Admitted From: home Disposition:  [place]  Recommendations for Outpatient Follow-up:  1. Follow up with PCP in 1-2 weeks 2. Please obtain BMP/CBC in one week 3. Please follow up on the following pending results:  Home Health:YES  Equipment/Devices: None  Discharge Condition: Stable Code Status:   Code Status: Full Code Diet recommendation:  Diet Order            Diet - low sodium heart healthy           Diet heart healthy/carb modified Room service appropriate? Yes; Fluid consistency: Thin  Diet effective now                  Brief/Interim Summary: 64 year old male with history of CAD with a stent angioplasty, COPD, chronic low back pain, GERD, hypertension diabetes mellitus with stage III kidney disease, Mnire's disease presented to the ED for chest pain, he felt she may have covid.  Work-up in the ED showed hyperglycemia, anion gap metabolic acidosis elevated LFTs and bilirubin, lactic acidosis ultrasound showed gallbladder wall sludge with finding representing sequelae of acalculous cholecystitis, CT of the abdomen with mild laziness of the gallbladder wall and pericholecystic fat, no calcified stone, 5.6X5.9 heterogeneously enhancing mass in the upper pole of the left kidney most concerning for malignancy.  Patient was admitted underwent cholecystostomy tube placement but continued to deteriorate requiring increased oxygen and IV fluids and unresponsive with severe acute hypoxic respiratory failure and circulatory shock/septic shock in the setting of cholecystitis.  He was managed in ICU.  Patient was subsequently stabilized, extubated 12/27 and has been off pressors Transferred to medical for 12/28.  PT OT has been working with him and has advised skilled nursing facility which patient has refused.He wanted to  consider a [facility] but they are not able to offer any bed at this time. His vitals have been stable afebrile blood work also stable surgery has signed off.   He was reevaluated by physical therapy who advised home health PT instead of skilled nursing facility at this time so he will not be discharged to skilled nursing facility (which however has been very difficult to find given his limited choice) and is being discharged home.   Discharge Diagnoses:   Acute acalculous cholecystitis: Seen by surgery s/p cholecystostomy tube with frank pus and some serosanguineous drain.  Has been on empiric Zosyn-and on discharge will switch to Augmentin to complete 2 weeks course based on culture sensitivity - drain grew Klebsiella .Patient will need outpatient close follow-up and cholecystostomy care and initially cholecystectomy.  Monitor the drain closely and it is no more having bloody output and mostly biliary output. he is off Lovenox how he is however on home prasugrel. Continue flush with 5 mill NS daily.  Abnormal LFTs in the setting of cholecystitis/sepsis/shock-LFTs improving AST significantly better today  Cont to monitor cholecystostomy drain and monitor LFTs as OP.Cont to hold statin until LFTs are improved.  Acute hepatitis panel negative. Recent Labs  Lab 02/12/20 0529 02/15/20 0627 02/16/20 0549 02/17/20 0406  AST  --  566* 395* 77*  ALT  --  260* 574* 345*  ALKPHOS  --  334* 328* 264*  BILITOT  --  2.5* 1.3* 0.9  PROT  --  6.6 6.3* 6.2*  ALBUMIN 2.3* 2.7* 2.5* 2.5*   Type 2 diabetes  mellitus with uncontrolled hyperglycemia, poorly controlled hemoglobin A1c 8.2.  Patient is on U500-40 units 3 times daily ( but he takes 70-80 utnies tiad based on sugar level), also on metformin at home, can resumhome regimen on d/c he was managed Lantus 20 units twice daily Premeal NovoLog and sliding scale and blood sugar overall stable here. Recent Labs  Lab 02/17/20 1209  02/17/20 1630 02/17/20 2129 02/18/20 0744 02/18/20 1208  GLUCAP 246* 199* 138* 224* 244*   Sepsis with septic shock present on admission, blood pressure is stabilized off pressors.  Klebsiella pneumonia grew on the cholecystostomy drain, blood culture no growth.  Being appropriately treated antibiotics.  Will be discharged on oral Augmentin  AKI due to septic shock on CKD (chronic kidney disease) stage IIIa:Baseline creatinine ~ 1.4 in 01/08/2019.  Renal function stable. off Foley. Recent Labs  Lab 02/14/20 0425 02/15/20 0627 02/16/20 0549 02/17/20 0406 02/18/20 0436  BUN 30* 27* 24* 20 18  CREATININE 0.85 0.87 0.91 0.95 0.81   Hypokalemia  resolved. 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  Currently holding statin due to elevated LFTs for now.  No chest pain  Benign essential HTN: On clonidine patch-weaned off and back on home regimen Cardizem, Vasotec.  Blood pressure overall stable.  Morbid obesity with BMI 33, will benefit with weight loss and healthy lifestyle.  OSA continue CPAP as tolerated  Medically stable for discharge pending skilled nursing facility placement.  Wife is unable to take care of him at home.  Consults:  Urology, general surgery, IR  Subjective: Alert awake resting comfortably some abdominal pain reports X pain medicine at home.  Cholecystostomy drain without any blood and is all biliary now. Afebrile, vitals stable  Discharge Exam: Vitals:   02/18/20 0744 02/18/20 1127  BP: 110/78 122/68  Pulse: (!) 59 70  Resp: 16 16  Temp: 97.9 F (36.6 C) 98.4 F (36.9 C)  SpO2: 98% 97%    General: Pt is alert, awake, not in acute distress Cardiovascular: RRR, S1/S2 +, no rubs, no gallops Respiratory: CTA bilaterally, no wheezing, no rhonchi Abdominal: Soft, NT, ND, bowel sounds + Extremities: no edema, no cyanosis  Discharge Instructions  Discharge Instructions    Diet - low sodium heart healthy   Complete by: As directed    Discharge instructions   Complete by: As directed    Please call call MD or return to ER for similar or worsening recurring problem that brought you to hospital or if any fever,nausea/vomiting,abdominal pain, uncontrolled pain, chest pain,  shortness of breath or any other alarming symptoms.  Please follow-up your doctor as instructed in a week time and call the office for appointment.  Please continue to hold your statin until started by primary care doctor after liver function test are normal.  You will need to follow-up with the drain clinic for the drain daily care with radiology  Please follow-up with urology regarding renal mass and with general surgery regarding the gallbladder  Flush the drain with sterile normal saline 5 mL daily  Please avoid alcohol, smoking, or any other illicit substance and maintain healthy habits including taking your regular medications as prescribed.  You were cared for by a hospitalist during your hospital stay. If you have any questions about your discharge medications or the care you received while you were in the hospital after you are discharged, you can call the unit and ask to speak with the hospitalist on call if the hospitalist that took care of you is not available.  Once you are discharged, your primary care physician will handle any further medical issues. Please note that NO REFILLS for any discharge medications will be authorized once you are discharged, as it is imperative that you return to your primary care physician (or establish a relationship with a primary care physician if you do not have one)  for your aftercare needs so that they can reassess your need for medications and monitor your lab values   Increase activity slowly   Complete by: As directed      Allergies as of 02/18/2020   No Known Allergies     Medication List    STOP taking these medications   HYDROcodone-acetaminophen 5-325 MG tablet Commonly known as: NORCO/VICODIN   rosuvastatin 40 MG tablet Commonly known as: CRESTOR     TAKE these medications   ALPRAZolam 0.5 MG tablet Commonly known as: XANAX Take 0.5 mg by mouth at bedtime.   amoxicillin-clavulanate 875-125 MG tablet Commonly known as: Augmentin Take 1 tablet by mouth 2 (two) times daily for 7 days.   BAQSIMI ONE PACK NA Place 1 Dose into the nose daily as needed (severely low blood sugar).   cetirizine 10 MG tablet Commonly known as: ZYRTEC Take 10 mg by mouth daily.   cyclobenzaprine 10 MG tablet Commonly known as: FLEXERIL Take 1 tablet (10 mg total) by mouth 3 (three) times daily as needed for muscle spasms.   diazepam 5 MG tablet Commonly known as: VALIUM Take 5 mg by mouth 3 (three) times daily as needed (Meniere's disease).   diltiazem 240 MG 24 hr capsule Commonly known as: CARDIZEM CD Take 240 mg by mouth at bedtime.   enalapril 10 MG tablet Commonly known as: VASOTEC Take 10 mg by mouth 2 (two) times daily.   esomeprazole 40 MG capsule Commonly known as: NEXIUM Take 40 mg by mouth 2 (two) times daily.   ezetimibe 10 MG tablet Commonly known as: ZETIA Take 10 mg by mouth at bedtime.   Fish Oil 1200 MG Caps Take 1,200 mg by mouth 2 (two) times daily.   gabapentin 800 MG tablet Commonly known as: NEURONTIN Take 2 tablets (1,600 mg total) by mouth 2 (two) times daily.   HumuLIN R U-500 KwikPen 500 UNIT/ML kwikpen Generic drug: insulin regular human CONCENTRATED Inject 40 Units into the skin 3 (three) times daily with meals. What changed:   how much to take  when to take this  additional instructions    isosorbide mononitrate 30 MG 24 hr tablet Commonly known as: IMDUR Take 30 mg by mouth at bedtime.   Magnesium 400 MG Tabs Take 400 mg by mouth daily.   Magnesium Oxide 500 MG Tabs Take 1 tablet (500 mg total) by mouth daily.   meclizine 25 MG tablet Commonly known as: ANTIVERT Take 25 mg by mouth 3 (three) times daily as needed for dizziness.   metFORMIN 1000 MG tablet Commonly known as: GLUCOPHAGE Take 1,000 mg by mouth 2 (two) times daily with a meal.   metoprolol succinate 100 MG 24 hr tablet Commonly known as: TOPROL-XL Take 100 mg by mouth 2 (two) times daily. Take with  or immediately following a meal.   montelukast 10 MG tablet Commonly known as: SINGULAIR Take 10 mg by mouth at bedtime.   naloxone 4 MG/0.1ML Liqd nasal spray kit Commonly known as: NARCAN Place 1 spray into the nose daily as needed (opioid overdose).   naloxone 4 MG/0.1ML Liqd nasal spray kit Commonly known as: NARCAN For overdose/increased sleepiness. Call 911 if you need to use this medication   niacin 500 MG CR tablet Commonly known as: NIASPAN Take 500 mg by mouth at bedtime.   nitroGLYCERIN 0.4 MG SL tablet Commonly known as: NITROSTAT Place 0.4 mg under the tongue every 5 (five) minutes as needed for chest pain.   Oxycodone HCl 10 MG Tabs Take 1 tablet (10 mg total) by mouth every 6 (six) hours as needed. Must last 30 days   prasugrel 10 MG Tabs tablet Commonly known as: EFFIENT Take 10 mg by mouth daily.   sodium chloride 0.9 % injection Flush the drain daily with 5 ml       Follow-up Information    [name], MD Follow up in 1 week(s).   Specialty: Family Medicine Why: For LFT check, discuss about statin Contact information: [address] [place] [address] [phone]        Abbie Sons, MD Follow up in 1 week(s).   Specialty: Urology Why: for follow up about renal mass Contact information: [address] [place] [address] [place] [address] [phone]        Ronny Bacon, MD Follow up.   Specialty: General Surgery Why: for follow up on cholecystitis/gallbladder issues Contact information: [address] [place] [address] [phone]              No Known Allergies  The results of significant diagnostics from this hospitalization (including imaging, microbiology, ancillary and laboratory) are listed below for reference.    Microbiology: Recent Results (from the past 240 hour(s))  Aerobic/Anaerobic Culture (surgical/deep wound)     Status: None   Collection Time: 02/09/20  3:00 PM   Specimen: Gallbladder; Abscess  Result Value Ref Range Status   Specimen Description   Final    GALL BLADDER Performed at [facility], [address] [place], [place] [address]    Special Requests   Final    NONE Performed at [facility], [address] [place], [place] [address]    Gram Stain   Final    ABUNDANT WBC PRESENT, PREDOMINANTLY PMN MODERATE GRAM NEGATIVE RODS    Culture   Final    FEW KLEBSIELLA PNEUMONIAE NO ANAEROBES ISOLATED Performed at [facility], [place] [address] [place], [place] [address]    Report Status 02/14/2020 FINAL  Final   Organism ID, Bacteria KLEBSIELLA PNEUMONIAE  Final      Susceptibility   Klebsiella pneumoniae - MIC*    AMPICILLIN >=32 RESISTANT Resistant     CEFAZOLIN <=4 SENSITIVE Sensitive     CEFEPIME <=0.12 SENSITIVE Sensitive     CEFTAZIDIME <=1 SENSITIVE Sensitive     CEFTRIAXONE <=0.25 SENSITIVE Sensitive     CIPROFLOXACIN <=0.25 SENSITIVE Sensitive     GENTAMICIN <=1 SENSITIVE Sensitive     IMIPENEM <=0.25 SENSITIVE Sensitive     TRIMETH/SULFA <=20 SENSITIVE Sensitive     AMPICILLIN/SULBACTAM 4 SENSITIVE Sensitive     PIP/TAZO <=4 SENSITIVE Sensitive     * FEW KLEBSIELLA PNEUMONIAE  MRSA PCR Screening     Status: None  Collection Time: 02/10/20 12:00 AM   Specimen: Nasal Mucosa;  Nasopharyngeal  Result Value Ref Range Status   MRSA by PCR NEGATIVE NEGATIVE Final    Comment:        The GeneXpert MRSA Assay (FDA approved for NASAL specimens only), is one component of a comprehensive MRSA colonization surveillance program. It is not intended to diagnose MRSA infection nor to guide or monitor treatment for MRSA infections. Performed at [facility], [address] [place], [place] [address]     Procedures/Studies: CT ABDOMEN PELVIS WO CONTRAST  Result Date: 02/09/2020 CLINICAL DATA:  Abdominal distension. Cholecystostomy tube placement today. EXAM: CT ABDOMEN AND PELVIS WITHOUT CONTRAST TECHNIQUE: Multidetector CT imaging of the abdomen and pelvis was performed following the standard protocol without IV contrast. COMPARISON:  CT abdomen pelvis dated February 07, 2020. FINDINGS: Lower chest: No acute abnormality. Subsegmental atelectasis in both lower lobes. Hepatobiliary: No focal liver abnormality. Interval placement of a transhepatic cholecystostomy tube within the gallbladder lumen. The gallbladder remains distended and contains new high density material. No biliary dilatation. Pancreas: Unremarkable. No pancreatic ductal dilatation or surrounding inflammatory changes. Spleen: Normal in size without focal abnormality. Adrenals/Urinary Tract: The adrenal glands are unremarkable. 5.8 x 5.7 cm solid mass arising from the upper pole of the left kidney is grossly unchanged but better evaluated on prior contrast enhanced study. Unchanged 1.9 cm simple cyst in the lateral midpole of the left kidney. No renal calculi or hydronephrosis. The bladder is unremarkable. Stomach/Bowel: Stomach is within normal limits. Prior appendectomy. No evidence of bowel wall thickening, distention, or inflammatory changes. Vascular/Lymphatic: Aortic atherosclerosis. No enlarged abdominal or pelvic lymph nodes. Reproductive: Prostate is unremarkable. Other: No free fluid or  pneumoperitoneum. Musculoskeletal: No acute or significant osseous findings. IMPRESSION: 1. Interval placement of a transhepatic cholecystostomy tube within the gallbladder lumen. The gallbladder remains distended with some high density material, likely sludge and/or a small amount of post-procedure hematoma. No evident complication or new acute intra-abdominal process. 2. Unchanged 5.8 cm solid mass arising from the upper pole of the left kidney, better evaluated on prior contrast enhanced study, consistent with renal cell carcinoma. 3. Aortic Atherosclerosis (ICD10-I70.0). Electronically Signed   By: Titus Dubin M.D.   On: 02/09/2020 16:26   DG Chest 1 View  Result Date: 02/09/2020 CLINICAL DATA:  Intubated, recent cholecystostomy tube placement EXAM: CHEST  1 VIEW COMPARISON:  02/07/2020 FINDINGS: Single frontal view of the chest demonstrates endotracheal tube overlying tracheal air column, tip just below thoracic inlet. The cardiac silhouette is unremarkable. Patchy retrocardiac consolidation likely reflects atelectasis. No effusion or pneumothorax. IMPRESSION: 1. No complication after intubation. 2. Minimal left basilar atelectasis. Electronically Signed   By: Randa Ngo M.D.   On: 02/09/2020 18:38   DG Chest 1 View  Result Date: 02/07/2020 CLINICAL DATA:  Shortness of breath and chest pain. EXAM: CHEST  1 VIEW COMPARISON:  02/06/2020 FINDINGS: Stable cardiomediastinal contours. The lung volumes are low. No pleural effusion or edema. No airspace densities identified. Review of the visualized osseous structures is unremarkable. IMPRESSION: 1. Lungs are suboptimally inflated but clear. Electronically Signed   By: Kerby Moors M.D.   On: 02/07/2020 17:49   DG Abd 1 View  Result Date: 02/09/2020 CLINICAL DATA:  OG tube placement EXAM: ABDOMEN - 1 VIEW COMPARISON:  CT dated February 07, 2020 FINDINGS: The OG tube projects over the gastric body. There is a percutaneous drain in the right  upper quadrant likely representing the patient's cholecystostomy tube.  The bowel gas pattern is nonobstructive. IMPRESSION: OG tube projects over the gastric body. Electronically Signed   By: Constance Holster M.D.   On: 02/09/2020 21:17   CT Angio Chest PE W and/or Wo Contrast  Result Date: 02/07/2020 CLINICAL DATA:  63 year old male with abdominal pain and fever. EXAM: CT ANGIOGRAPHY CHEST CT ABDOMEN AND PELVIS WITH CONTRAST TECHNIQUE: Multidetector CT imaging of the chest was performed using the standard protocol during bolus administration of intravenous contrast. Multiplanar CT image reconstructions and MIPs were obtained to evaluate the vascular anatomy. Multidetector CT imaging of the abdomen and pelvis was performed using the standard protocol during bolus administration of intravenous contrast. CONTRAST:  56m OMNIPAQUE IOHEXOL 350 MG/ML SOLN COMPARISON:  Chest radiograph dated 02/07/2020 and CT of the abdomen pelvis dated 05/07/2007. FINDINGS: CTA CHEST FINDINGS Cardiovascular: Top-normal cardiac size. No pericardial effusion. Three-vessel coronary vascular calcification. The thoracic aorta is unremarkable. The origins of the great vessels of the aortic arch appear patent. Evaluation of the pulmonary arteries is somewhat limited due to respiratory motion artifact and suboptimal visualization of the peripheral branches. No central pulmonary artery embolus identified. Mediastinum/Nodes: No hilar or mediastinal adenopathy. The esophagus and the thyroid gland are grossly unremarkable. No mediastinal fluid collection. Lungs/Pleura: There are bibasilar subpleural atelectasis/scarring. No focal consolidation, pleural effusion, pneumothorax. The central airways are patent. Musculoskeletal: T8-T9 disc spacer. No acute osseous pathology. Review of the MIP images confirms the above findings. CT ABDOMEN and PELVIS FINDINGS No intra-abdominal free air or free fluid. Hepatobiliary: Fatty liver. No intrahepatic  biliary dilatation. There is mild haziness of the gallbladder wall and pericholecystic fat. No calcified gallstone. Ultrasound may provide better evaluation of the gallbladder if there is clinical concern for acute cholecystitis. Pancreas: Unremarkable. No pancreatic ductal dilatation or surrounding inflammatory changes. Spleen: Normal in size without focal abnormality. Adrenals/Urinary Tract: The adrenal glands unremarkable. There is a 5.6 x 5.9 cm heterogeneously enhancing mass in the upper pole of the left kidney most concerning for malignancy. Further characterization with renal mass protocol MRI is recommended. Additional 2 cm hypodense lesion in the upper pole of the left kidney laterally is not characterized but possibly a cyst. There is no hydronephrosis on either side. There is symmetric enhancement and excretion of contrast by both kidneys. The visualized ureters and urinary bladder appear unremarkable. Stomach/Bowel: There is no bowel obstruction or active inflammation. Appendectomy. Vascular/Lymphatic: Mild aortoiliac atherosclerotic disease. The IVC is unremarkable. No portal venous gas. There is no adenopathy. Reproductive: The prostate and seminal vesicles are grossly unremarkable. No pelvic mass. Other: None Musculoskeletal: No acute or significant osseous findings. Review of the MIP images confirms the above findings. IMPRESSION: 1. No acute intrathoracic pathology. No CT evidence of central pulmonary artery embolus. 2. Mild haziness of the gallbladder wall and pericholecystic fat. No calcified gallstone. Ultrasound may provide better evaluation of the gallbladder if there is clinical concern for acute cholecystitis. 3. A 5.6 x 5.9 cm heterogeneously enhancing mass in the upper pole of the left kidney most concerning for malignancy. Further characterization with renal mass protocol MRI is recommended. 4. Fatty liver. 5. No bowel obstruction. 6. Aortic Atherosclerosis (ICD10-I70.0). Electronically  Signed   By: AAnner CreteM.D.   On: 02/07/2020 20:37   NM Hepatobiliary Liver Func  Result Date: 02/08/2020 CLINICAL DATA:  Chronic upper abdominal pain assess gallbladder motility EXAM: NUCLEAR MEDICINE HEPATOBILIARY IMAGING TECHNIQUE: Sequential images of the abdomen were obtained out to 60 minutes following intravenous administration of radiopharmaceutical. RADIOPHARMACEUTICALS:  5.2 mCi  Tc-34m Choletec IV COMPARISON:  02/07/2020 ultrasound abdomen and CT abdomen/pelvis exams FINDINGS: Delayed bloodstream clearance of tracer by liver indicating hepatocellular dysfunction. Prolonged hepatocellular retention of tracer. At 1 hour, no excretion of tracer into biliary tree. At 2 hours, no excretion of tracer into biliary tree. Static image at 4 hours shows minimal tracer in small bowel loops in the LEFT mid abdomen indicating at least partial patency of the CBD. Gallbladder did not visualized by 4 hours. However, because of the severe degree of hepatocellular dysfunction and poor excretion of tracer, unable to assess patency of the cystic duct to exclude cholecystitis. Patient refused imaging beyond 4 hours. IMPRESSION: Severe hepatocellular dysfunction with marked retention of tracer within hepatic parenchyma at 4 hours. Only minimal tracer is seen within small bowel loops in the LEFT mid abdomen at 4 hours, indicating at least partial patency of CBD. Unable to assess patency of cystic duct, acute cholecystitis, or gallbladder emptying due to the severe degree of hepatocellular dysfunction. Electronically Signed   By: MLavonia DanaM.D.   On: 02/08/2020 16:00   CT ABDOMEN PELVIS W CONTRAST  Result Date: 02/07/2020 CLINICAL DATA:  64year old male with abdominal pain and fever. EXAM: CT ANGIOGRAPHY CHEST CT ABDOMEN AND PELVIS WITH CONTRAST TECHNIQUE: Multidetector CT imaging of the chest was performed using the standard protocol during bolus administration of intravenous contrast. Multiplanar CT image  reconstructions and MIPs were obtained to evaluate the vascular anatomy. Multidetector CT imaging of the abdomen and pelvis was performed using the standard protocol during bolus administration of intravenous contrast. CONTRAST:  74mOMNIPAQUE IOHEXOL 350 MG/ML SOLN COMPARISON:  Chest radiograph dated 02/07/2020 and CT of the abdomen pelvis dated 05/07/2007. FINDINGS: CTA CHEST FINDINGS Cardiovascular: Top-normal cardiac size. No pericardial effusion. Three-vessel coronary vascular calcification. The thoracic aorta is unremarkable. The origins of the great vessels of the aortic arch appear patent. Evaluation of the pulmonary arteries is somewhat limited due to respiratory motion artifact and suboptimal visualization of the peripheral branches. No central pulmonary artery embolus identified. Mediastinum/Nodes: No hilar or mediastinal adenopathy. The esophagus and the thyroid gland are grossly unremarkable. No mediastinal fluid collection. Lungs/Pleura: There are bibasilar subpleural atelectasis/scarring. No focal consolidation, pleural effusion, pneumothorax. The central airways are patent. Musculoskeletal: T8-T9 disc spacer. No acute osseous pathology. Review of the MIP images confirms the above findings. CT ABDOMEN and PELVIS FINDINGS No intra-abdominal free air or free fluid. Hepatobiliary: Fatty liver. No intrahepatic biliary dilatation. There is mild haziness of the gallbladder wall and pericholecystic fat. No calcified gallstone. Ultrasound may provide better evaluation of the gallbladder if there is clinical concern for acute cholecystitis. Pancreas: Unremarkable. No pancreatic ductal dilatation or surrounding inflammatory changes. Spleen: Normal in size without focal abnormality. Adrenals/Urinary Tract: The adrenal glands unremarkable. There is a 5.6 x 5.9 cm heterogeneously enhancing mass in the upper pole of the left kidney most concerning for malignancy. Further characterization with renal mass protocol  MRI is recommended. Additional 2 cm hypodense lesion in the upper pole of the left kidney laterally is not characterized but possibly a cyst. There is no hydronephrosis on either side. There is symmetric enhancement and excretion of contrast by both kidneys. The visualized ureters and urinary bladder appear unremarkable. Stomach/Bowel: There is no bowel obstruction or active inflammation. Appendectomy. Vascular/Lymphatic: Mild aortoiliac atherosclerotic disease. The IVC is unremarkable. No portal venous gas. There is no adenopathy. Reproductive: The prostate and seminal vesicles are grossly unremarkable. No pelvic mass. Other: None Musculoskeletal: No acute or significant osseous  findings. Review of the MIP images confirms the above findings. IMPRESSION: 1. No acute intrathoracic pathology. No CT evidence of central pulmonary artery embolus. 2. Mild haziness of the gallbladder wall and pericholecystic fat. No calcified gallstone. Ultrasound may provide better evaluation of the gallbladder if there is clinical concern for acute cholecystitis. 3. A 5.6 x 5.9 cm heterogeneously enhancing mass in the upper pole of the left kidney most concerning for malignancy. Further characterization with renal mass protocol MRI is recommended. 4. Fatty liver. 5. No bowel obstruction. 6. Aortic Atherosclerosis (ICD10-I70.0). Electronically Signed   By: Anner Crete M.D.   On: 02/07/2020 20:37   IR Perc Cholecystostomy  Result Date: 02/09/2020 INDICATION: 64 year old male with acute cholecystitis, poor operative candidate. EXAM: Fluoroscopic and ultrasound-guided placement of cholecystostomy tube MEDICATIONS: The patient is receiving intravenous Zosyn as an inpatient. ANESTHESIA/SEDATION: Moderate (conscious) sedation was employed during this procedure. A total of Versed 0.5 mg and Fentanyl 25 mcg was administered intravenously. Moderate Sedation Time: 10 minutes. The patient's level of consciousness and vital signs were  monitored continuously by radiology nursing throughout the procedure under my direct supervision. FLUOROSCOPY TIME:  Fluoroscopy Time: 0.7 minutes (14 mGy). COMPLICATIONS: None immediate. PROCEDURE: Informed written consent was obtained from the patient after a thorough discussion of the procedural risks, benefits and alternatives. All questions were addressed. Maximal Sterile Barrier Technique was utilized including caps, mask, sterile gowns, sterile gloves, sterile drape, hand hygiene and skin antiseptic. A timeout was performed prior to the initiation of the procedure. The patient was placed supine on the angiographic table. The patient's right upper quadrant was then prepped and draped in normal sterile fashion with maximum sterile barrier. Ultrasound demonstrates a distended gallbladder. Subdermal Local anesthesia was provided at the planned skin entry site. Under ultrasound guidance, deeper local anesthetic was provided through intercostal muscles and along the liver capsule. Ultrasound was used to puncture the gallbladder using a 21 gauge Chiba needle via a transhepatic approach. A 0.018 inch wire was advanced into the lumen and a transition dilator placed. A gentle hand injection of contrast was performed. Cholecystogram demonstrates a distended gallbladder without definite evidence of gallstones. The cystic duct is obstructed. A 0.035 inch exchange wire was placed in the tract was dilated. A 10.2 French multipurpose drainage catheter was advanced into the gallbladder lumen. The drain was then secured in place using a 0-silk suture and a Stayfix device. A sterile dressing was applied. The tube was placed to bag drainage. A culture was sent to the lab for analysis. The patient tolerated procedure well without evidence of immediate complication was transferred back to the floor in stable condition. IMPRESSION: Successful placement of a 10.2 French percutaneous, transhepatic cholecystostomy tube. PLAN: Return  in 6 to 8 weeks for routine cholecystostomy tube check and change pending interim surgical intervention. Ruthann Cancer, MD [facility] Electronically Signed   By: Ruthann Cancer MD   On: 02/09/2020 15:34   DG Chest Port 1 View  Result Date: 02/13/2020 CLINICAL DATA:  Ventilator dependence. EXAM: PORTABLE CHEST 1 VIEW COMPARISON:  02/09/2020 FINDINGS: 0416 hours. Endotracheal tube tip is 4.3 cm above the base of the carina. The NG tube passes into the stomach although the distal tip position is not included on the film. Esophageal temperature probe evident. Low lung volumes with basilar atelectasis/infiltrate, minimally progressed in the interval. Telemetry leads overlie the chest. IMPRESSION: 1. Interval progression of bibasilar atelectasis/infiltrate. 2. ETT tip is 4.3 cm above the base of  the carina. Electronically Signed   By: Misty Stanley M.D.   On: 02/13/2020 04:53   US ABDOMEN LIMITED RUQ (LIVER/GB)  Result Date: 02/07/2020 CLINICAL DATA:  Right upper quadrant pain x2 days. EXAM: ULTRASOUND ABDOMEN LIMITED RIGHT UPPER QUADRANT COMPARISON:  None. FINDINGS: Gallbladder: Echogenic sludge is seen within the gallbladder lumen, without evidence of gallstones. Gallbladder wall thickening is noted (3.3 mm). A trace amount of pericholecystic fluid is also seen. No sonographic Murphy sign noted by sonographer. Common bile duct: Diameter: 3.6 mm Liver: No focal lesion identified. There is diffusely increased echogenicity of the liver parenchyma. Portal vein is patent on color Doppler imaging with normal direction of blood flow towards the liver. Other: None. IMPRESSION: Gallbladder sludge with additional findings that may represent sequelae associated with acalculous cholecystitis. Further evaluation with a nuclear medicine hepatobiliary scan is recommended. Electronically Signed   By: Virgina Norfolk M.D.   On: 02/07/2020 21:46    Labs: BNP (last  3 results) No results for input(s): BNP in the last 8760 hours. Basic Metabolic Panel: Recent Labs  Lab 02/12/20 0529 02/13/20 0528 02/14/20 0425 02/15/20 0627 02/16/20 0549 02/17/20 0406 02/18/20 0436  NA 140 142 144 143 141 140 139  K 3.6 4.0 3.2* 3.3* 3.4* 3.3* 3.8  CL 104 105 105 107 106 105 107  CO2 25 27 29 24 25 25 25   GLUCOSE 261* 309* 218* 237* 263* 241* 243*  BUN 25* 28* 30* 27* 24* 20 18  CREATININE 0.88 0.94 0.85 0.87 0.91 0.95 0.81  CALCIUM 9.3 9.5 9.9 9.8 9.6 9.6 9.5  MG 1.6* 1.8 2.0 1.9 1.7  --   --   PHOS 3.3 2.9 2.6  --   --   --   --    Liver Function Tests: Recent Labs  Lab 02/12/20 0529 02/15/20 0627 02/16/20 0549 02/17/20 0406  AST  --  566* 395* 77*  ALT  --  260* 574* 345*  ALKPHOS  --  334* 328* 264*  BILITOT  --  2.5* 1.3* 0.9  PROT  --  6.6 6.3* 6.2*  ALBUMIN 2.3* 2.7* 2.5* 2.5*   No results for input(s): LIPASE, AMYLASE in the last 168 hours. No results for input(s): AMMONIA in the last 168 hours. CBC: Recent Labs  Lab 02/12/20 0529 02/13/20 0528 02/14/20 0425 02/15/20 0627 02/16/20 0549 02/17/20 0406 02/18/20 0436  WBC 7.0   < > 10.2 9.4 7.0 8.6 10.1  NEUTROABS 4.1  --   --   --   --   --   --   HGB 11.7*   < > 13.5 13.0 12.6* 12.8* 13.0  HCT 35.7*   < > 40.4 39.4 38.0* 38.6* 39.3  MCV 83.6   < > 82.6 83.1 82.8 82.5 83.4  PLT 221   < > 319 316 301 303 283   < > = values in this interval not displayed.   Cardiac Enzymes: No results for input(s): CKTOTAL, CKMB, CKMBINDEX, TROPONINI in the last 168 hours. BNP: Invalid input(s): POCBNP CBG: Recent Labs  Lab 02/17/20 1209 02/17/20 1630 02/17/20 2129 02/18/20 0744 02/18/20 1208  GLUCAP 246* 199* 138* 224* 244*   D-Dimer No results for input(s): DDIMER in the last 72 hours. Hgb A1c No results for input(s): HGBA1C in the last 72 hours. Lipid Profile No results for input(s): CHOL, HDL, LDLCALC, TRIG, CHOLHDL, LDLDIRECT in the last 72 hours. Thyroid function studies No  results for input(s): TSH, T4TOTAL, T3FREE, THYROIDAB in the last 72  hours.  Invalid input(s): FREET3 Anemia work up No results for input(s): VITAMINB12, FOLATE, FERRITIN, TIBC, IRON, RETICCTPCT in the last 72 hours. Urinalysis    Component Value Date/Time   COLORURINE AMBER (A) 02/07/2020 1746   APPEARANCEUR HAZY (A) 02/07/2020 1746   LABSPEC 1.029 02/07/2020 1746   PHURINE 5.0 02/07/2020 1746   GLUCOSEU 50 (A) 02/07/2020 1746   HGBUR SMALL (A) 02/07/2020 1746   BILIRUBINUR SMALL (A) 02/07/2020 1746   KETONESUR NEGATIVE 02/07/2020 1746   PROTEINUR 100 (A) 02/07/2020 1746   NITRITE NEGATIVE 02/07/2020 1746   LEUKOCYTESUR NEGATIVE 02/07/2020 1746   Sepsis Labs Invalid input(s): PROCALCITONIN,  WBC,  LACTICIDVEN Microbiology Recent Results (from the past 240 hour(s))  Aerobic/Anaerobic Culture (surgical/deep wound)     Status: None   Collection Time: 02/09/20  3:00 PM   Specimen: Gallbladder; Abscess  Result Value Ref Range Status   Specimen Description   Final    GALL BLADDER Performed at [facility], [address] [place], [place] [address]    Special Requests   Final    NONE Performed at [facility], [address] [place], [place] [address]    Gram Stain   Final    ABUNDANT WBC PRESENT, PREDOMINANTLY PMN MODERATE GRAM NEGATIVE RODS    Culture   Final    FEW KLEBSIELLA PNEUMONIAE NO ANAEROBES ISOLATED Performed at [facility], [place] [address] [place], [place] [address]    Report Status 02/14/2020 FINAL  Final   Organism ID, Bacteria KLEBSIELLA PNEUMONIAE  Final      Susceptibility   Klebsiella pneumoniae - MIC*    AMPICILLIN >=32 RESISTANT Resistant     CEFAZOLIN <=4 SENSITIVE Sensitive     CEFEPIME <=0.12 SENSITIVE Sensitive     CEFTAZIDIME <=1 SENSITIVE Sensitive     CEFTRIAXONE <=0.25 SENSITIVE Sensitive     CIPROFLOXACIN <=0.25 SENSITIVE Sensitive     GENTAMICIN <=1 SENSITIVE Sensitive     IMIPENEM <=0.25 SENSITIVE Sensitive      TRIMETH/SULFA <=20 SENSITIVE Sensitive     AMPICILLIN/SULBACTAM 4 SENSITIVE Sensitive     PIP/TAZO <=4 SENSITIVE Sensitive     * FEW KLEBSIELLA PNEUMONIAE  MRSA PCR Screening     Status: None   Collection Time: 02/10/20 12:00 AM   Specimen: Nasal Mucosa; Nasopharyngeal  Result Value Ref Range Status   MRSA by PCR NEGATIVE NEGATIVE Final    Comment:        The GeneXpert MRSA Assay (FDA approved for NASAL specimens only), is one component of a comprehensive MRSA colonization surveillance program. It is not intended to diagnose MRSA infection nor to guide or monitor treatment for MRSA infections. Performed at [facility], [address] [place], [place] [address]      Time coordinating discharge: 35 minutes  SIGNED: Antonieta Pert, MD  [facility] 02/18/2020, 1:55 PM  If 7PM-7AM, please contact night-coverage [email or web address]

## 2020-02-18 NOTE — TOC Progression Note (Addendum)
Transition of Care [facility]) - Progression Note    Patient Details  Name: Kenneth Osborn MRN: [id number] Date of Birth: 15-Nov-1955  Transition [facility]) CM/SW [place], LCSW Phone Number: 02/18/2020, 10:18 AM  Clinical Narrative:         CSW met with patient and spouse at bedside. Patient's wife now feels she cannot meet patient's needs at home until he gets some rehab at a SNF. Patient adamant that the only SNFs he will accept are [facility], [place], or [facility]. CSW explained multiple times that [facility] unable to make bed offer, [facility] does not take people from the community, and CSW will reach out to [facility]. Explained that patient needs to come up with other SNFs he would agree to go to if [facility] says no. Patient still adamant that he will not go to any SNF other than those 3. Explained that insurance would not cover a continued hospital stay if patient is medically ready for DC. Left CSW number for patient's wife to follow up on other SNFs they would be willing for patient to go to.  Sent patient's information to [facility] in [place]. Called [facility] Admissions Olivia Mackie. Left voicemail requesting a return call.   12:00- Call from patient's wife. PT now recommending HHPT. Patient's wife says they would like patient to go home with [place] as previously planned. She asked what happens if she gets patient home and decides he needs SNF, explained SW can be added to [place] orders if she would like, she said yes. Updated Corene Cornea with [facility] and Attending MD.  1:50- Call from patient who asked if CSW had heard from [facility]. Provided update of conversation with PT and patient's wife and plan for Home Health. He asked if he can talk to PT, sent secure chat to PT. PT recommended RW as well, patient said he needs one ordered. Ordered through [place] and asked MD for order.   2:50- Call from patient, asking if CSW can order plastic coverings  for patient's bed and couch as well as knobs for legs of furniture to protect patient's floors and furniture. Explained we do not order that, only DME. Patient said "ok, well you are taking your life into your own hands, my wife will not be happy about that." Informed him that patient's wife can call if she has any questions about this, but that CSW has no way of ordering these items, that insurance does not cover this. He verbalized understanding.  Expected Discharge Plan: [place] Barriers to Discharge: Continued Medical Work up  Expected Discharge Plan and Services Expected Discharge Plan: [place] arrangements for the past 2 months: [place] Arranged: RN,PT,OT,Nurse's Aide [place]: [place] ([place]) Date [place]: 02/15/20   Representative spoke with at [place]: Floydene Flock   Social Determinants of Health (SDOH) Interventions    Readmission Risk Interventions No flowsheet data found.

## 2020-02-18 NOTE — Plan of Care (Signed)

## 2020-02-18 NOTE — TOC Transition Note (Addendum)
Transition of Care [facility]) - CM/SW Discharge Note   Patient Details  Name: HOLBERT CAPLES MRN: [id number] Date of Birth: February 05, 1956  Transition of Care [facility]) CM/SW Contact:  Magnus Ivan, LCSW Phone Number: 02/18/2020, 2:59 PM   Clinical Narrative:   Patient has orders do DC home today. [facility] RN, PT, OT, Aide, SW arranged. Corene Cornea with [facility] aware of DC plan. RW ordered to be delivered to room by [place]. No other needs identified. CSW signing off.     Final next level of care: [place] Barriers to Discharge: Barriers Resolved   Patient Goals and CMS Choice Patient states their goals for this hospitalization and ongoing recovery are:: home with home health CMS Medicare.gov Compare Post Acute Care list provided to:: Patient Choice offered to / list presented to : [facility]  Discharge Placement                Patient to be transferred to facility by: family Name of family member notified: wife aware of DC Patient and family notified of of transfer: 02/18/20  Discharge Plan and Services                DME Arranged: Gilford Rile rolling DME Agency: [employer] Date DME Agency Contacted: 02/18/20   Representative spoke with at DME Agency: [place]: RN,PT,OT,Nurse's [place] Work [employer] Agency: [place] ([place]) Date [place]: 02/18/20   Representative spoke with at [place]: [place] ([place]) Interventions     Readmission Risk Interventions No flowsheet data found.

## 2020-02-18 NOTE — Progress Notes (Signed)
PROGRESS NOTE    Kenneth Osborn  VOZ:[id number] DOB: Aug 02, 1955 DOA: 02/07/2020 PCP: [name], MD   Chief Complaint  Patient presents with  . Chest Pain    Brief Narrative: 64 year old male with history of CAD with a stent angioplasty, COPD, chronic low back pain, GERD, hypertension diabetes mellitus with stage III kidney disease, Mnire's disease presented to the ED for chest pain, he felt she may have covid.  Work-up in the ED showed hyperglycemia, anion gap metabolic acidosis elevated LFTs and bilirubin, lactic acidosis ultrasound showed gallbladder wall sludge with finding representing sequelae of acalculous cholecystitis, CT of the abdomen with mild laziness of the gallbladder wall and pericholecystic fat, no calcified stone, 5.6X5.9 heterogeneously enhancing mass in the upper pole of the left kidney most concerning for malignancy.  Patient was admitted underwent cholecystostomy tube placement but continued to deteriorate requiring increased oxygen and IV fluids and unresponsive with severe acute hypoxic respiratory failure and circulatory shock/septic shock in the setting of cholecystitis.  He was managed in ICU.  Patient was subsequently stabilized, extubated 12/27 and has been off pressors Transferred to medical for 12/28.  PT OT has been working with him and has advised skilled nursing facility which patient has refused.He wanted to consider a [facility] but they are not able to offer any bed at this time. His vitals have been stable afebrile blood work also stable surgery has signed off.    Subjective:  Resting comfortably.  Biliary drain is no more bloody. Afebrile.  He reports he takes chronic opiates at home. Wife is at the bedside.  Assessment/plan  Acute acalculous cholecystitis: Seen by surgery s/p cholecystostomy tube with frank pus and some serosanguineous drain.  Has been on empiric Zosyn-and on discharge will switch to Augmentin to complete  2 weeks course based on culture sensitivity - drain grew Klebsiella .Patient will need outpatient close follow-up and cholecystostomy care and initially cholecystectomy.  Monitor the drain closely and it is nore more having bloody output and mostly biliary output. he is off Lovenox how he is however on home prasugrel. Continue flush with 5 mill NS daily.  Communicated w/ Dr. Archie Endo from IR, who will arrange for outpatient follow-up on the drain clinic.  Abnormal LFTs in the setting of cholecystitis/sepsis/shock-LFTs improving AST significantly better today  Cont to monitor cholecystostomy drain and monitor LFTs as OP.Cont to hold statin until LFTs are improved.  Acute hepatitis panel negative. Recent Labs  Lab 02/12/20 0529 02/15/20 0627 02/16/20 0549 02/17/20 0406  AST  --  566* 395* 77*  ALT  --  260* 574* 345*  ALKPHOS  --  334* 328* 264*  BILITOT  --  2.5* 1.3* 0.9  PROT  --  6.6 6.3* 6.2*  ALBUMIN 2.3* 2.7* 2.5* 2.5*   Type 2 diabetes mellitus with uncontrolled hyperglycemia, poorly controlled hemoglobin A1c 8.2.  Patient is on U500-40 units 3 times daily ( but he takes 70-80 utnies tiad based on sugar level), also on metformin at home, can resumhome regimen on d/c he was managed Lantus 20 units twice daily Premeal NovoLog and sliding scale and blood sugar overall stable here. Recent Labs  Lab 02/17/20 0751 02/17/20 1209 02/17/20 1630 02/17/20 2129 02/18/20 0744  GLUCAP 227* 246* 199* 138* 224*   Sepsis with septic shock present on admission, blood pressure is stabilized off pressors.  Klebsiella pneumonia grew on the cholecystostomy drain, blood culture no growth.  Being appropriately treated antibiotics.  Will be discharged  on oral Augmentin  AKI due to septic shock on CKD (chronic kidney disease) stage IIIa:Baseline creatinine ~ 1.4 in 01/08/2019.  Renal function stable. off Foley. Recent Labs  Lab 02/14/20 0425 02/15/20 0627 02/16/20 0549 02/17/20 0406 02/18/20 0436   BUN 30* 27* 24* 20 18  CREATININE 0.85 0.87 0.91 0.95 0.81   Hypokalemia  resolved. 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  Currently holding statin due to elevated LFTs for now.  No chest pain  Benign essential HTN: On clonidine patch-weaned off and back on home regimen Cardizem, Vasotec.  Blood pressure overall stable.  Morbid obesity with BMI 33, will benefit with weight loss and healthy lifestyle.  OSA continue CPAP as tolerated  Medically stable for discharge pending skilled nursing facility placement.  Wife is unable to take care of him at home.  Nutrition: Diet Order            Diet heart healthy/carb modified Room service appropriate? Yes; Fluid consistency: Thin  Diet effective now                 Nutrition Problem: Inadequate oral intake Etiology: inability to eat (pt sedated and ventilated) Signs/Symptoms: NPO status    Body mass index is 33.23 kg/m.   DVT prophylaxis: Place and maintain sequential compression device Start: 02/16/20 1151lovenox-will hold due to bloody output on the drain and add SCDs Code Status:   Code Status: Full Code  Family Communication: plan of care discussed with patient and his wife at bedside.  Status is: Inpatient  Remains inpatient appropriate because: Unsafe disposition, pending placement  Dispo: The patient is from: Home              Anticipated d/c is to: Skilled nursing facility once available, if not may need to consider home health.  Wife is unable to take care of him at home PT OT has advised skilled nursing facility.  He wants to construct labs in [place] aware. Anticipated d/c date is once bed available              Patient currently is medically stable for discharge  Consultants:see note  Procedures:see note  Culture/Microbiology    Component Value Date/Time   SDES  02/09/2020 1500    GALL BLADDER Performed at [facility], [address] [place], [place] [address]    [facility]  02/09/2020 1500    NONE Performed at [facility], [address] [place], [place] [address]    CULT  02/09/2020 1500    FEW KLEBSIELLA PNEUMONIAE NO ANAEROBES ISOLATED Performed at [facility] [address], [place], [place] [address]    REPTSTATUS 02/14/2020 FINAL 02/09/2020 1500    Other culture-see note  Medications: Scheduled Meds: . ALPRAZolam  0.5 mg Oral QHS  . diltiazem  240 mg Oral Daily  . enalapril  10 mg Oral BID  . insulin aspart  0-15 Units Subcutaneous TID WC  . insulin aspart  0-5 Units Subcutaneous QHS  . insulin aspart  5 Units Subcutaneous TID WC  . insulin glargine  20 Units Subcutaneous BID  . isosorbide mononitrate  30 mg Oral  Daily  . loratadine  10 mg Oral Daily  . metoprolol succinate  100 mg Oral BID  . montelukast  10 mg Oral QHS  . multivitamin with minerals  1 tablet Oral Daily  . pantoprazole  40 mg Oral BID  . prasugrel  10 mg Oral Daily  . Ensure Max Protein  11 oz Oral BID   Continuous Infusions: . sodium chloride 250 mL (02/16/20 1303)    Antimicrobials: Anti-infectives (From admission, onward)   Start     Dose/Rate Route Frequency Ordered Stop   02/08/20 1800  cefTRIAXone (ROCEPHIN) 2 g in sodium chloride 0.9 % 100 mL IVPB  Status:  Discontinued        2 g 200 mL/hr over 30 Minutes Intravenous Every 24 hours 02/07/20 2230 02/07/20 2300   02/07/20 2330  piperacillin-tazobactam (ZOSYN) IVPB 3.375 g        3.375 g 12.5 mL/hr over 240 Minutes Intravenous Every 8 hours 02/07/20 2230 02/16/20 2359   02/07/20 2230  metroNIDAZOLE (FLAGYL) IVPB 500 mg  Status:   Discontinued        500 mg 100 mL/hr over 60 Minutes Intravenous  Once 02/07/20 2223 02/07/20 2259   02/07/20 1930  cefTRIAXone (ROCEPHIN) 2 g in sodium chloride 0.9 % 100 mL IVPB        2 g 200 mL/hr over 30 Minutes Intravenous  Once 02/07/20 1918 02/07/20 2101     Objective: Vitals: Today's Vitals   02/18/20 0744 02/18/20 0850 02/18/20 0950 02/18/20 1127  BP: 110/78   122/68  Pulse: (!) 59   70  Resp: 16   16  Temp: 97.9 F (36.6 C)   98.4 F (36.9 C)  TempSrc:      SpO2: 98%   97%  Weight:      Height:      PainSc:  6  0-No pain     Intake/Output Summary (Last 24 hours) at 02/18/2020 1207 Last data filed at 02/17/2020 2244 Gross per 24 hour  Intake 250 ml  Output 750 ml  Net -500 ml   Filed Weights   02/07/20 1645  Weight: 102.1 kg   Weight change:   Intake/Output from previous day: 12/30 0701 - 12/31 0700 In: 380 [P.O.:370] Out: 820 [Urine:750; Drains:70] Intake/Output this shift: No intake/output data recorded.  Examination: General exam: AAOx3 , NAD, weak appearing. HEENT:Oral mucosa moist, Ear/Nose WNL grossly, dentition normal. Respiratory system: bilaterally clear,no wheezing or crackles,no use of accessory muscle Cardiovascular system: S1 & S2 +, No JVD, right abdomen with cholecystostomy drain with biliary output. Gastrointestinal system: Abdomen soft, NT,ND, BS+ Nervous System:Alert, awake, moving extremities and grossly nonfocal Extremities: No edema, distal peripheral pulses palpable.  Skin: No rashes,no icterus. MSK: Normal muscle bulk,tone, power    Data Reviewed: I have personally reviewed following labs and imaging studies CBC: Recent Labs  Lab 02/12/20 0529 02/13/20 0528 02/14/20 0425 02/15/20 0627 02/16/20 0549 02/17/20 0406 02/18/20 0436  WBC 7.0   < > 10.2 9.4 7.0 8.6 10.1  NEUTROABS 4.1  --   --   --   --   --   --   HGB 11.7*   < > 13.5 13.0 12.6* 12.8* 13.0  HCT 35.7*   < > 40.4 39.4 38.0* 38.6* 39.3  MCV 83.6   < > 82.6  83.1 82.8 82.5 83.4  PLT 221   < > 319 316 301 303 283   < > = values in this interval not displayed.  Basic Metabolic Panel: Recent Labs  Lab 02/12/20 0529 02/13/20 0528 02/14/20 0425 02/15/20 0627 02/16/20 0549 02/17/20 0406 02/18/20 0436  NA 140 142 144 143 141 140 139  K 3.6 4.0 3.2* 3.3* 3.4* 3.3* 3.8  CL 104 105 105 107 106 105 107  CO2 25 27 29 24 25 25 25   GLUCOSE 261* 309* 218* 237* 263* 241* 243*  BUN 25* 28* 30* 27* 24* 20 18  CREATININE 0.88 0.94 0.85 0.87 0.91 0.95 0.81  CALCIUM 9.3 9.5 9.9 9.8 9.6 9.6 9.5  MG 1.6* 1.8 2.0 1.9 1.7  --   --   PHOS 3.3 2.9 2.6  --   --   --   --    GFR: Estimated Creatinine Clearance: 107.1 mL/min (by C-G formula based on SCr of 0.81 mg/dL). Liver Function Tests: Recent Labs  Lab 02/12/20 0529 02/15/20 0627 02/16/20 0549 02/17/20 0406  AST  --  566* 395* 77*  ALT  --  260* 574* 345*  ALKPHOS  --  334* 328* 264*  BILITOT  --  2.5* 1.3* 0.9  PROT  --  6.6 6.3* 6.2*  ALBUMIN 2.3* 2.7* 2.5* 2.5*   No results for input(s): LIPASE, AMYLASE in the last 168 hours. No results for input(s): AMMONIA in the last 168 hours. Coagulation Profile: No results for input(s): INR, PROTIME in the last 168 hours. Cardiac Enzymes: No results for input(s): CKTOTAL, CKMB, CKMBINDEX, TROPONINI in the last 168 hours. BNP (last 3 results) No results for input(s): PROBNP in the last 8760 hours. HbA1C: No results for input(s): HGBA1C in the last 72 hours. CBG: Recent Labs  Lab 02/17/20 0751 02/17/20 1209 02/17/20 1630 02/17/20 2129 02/18/20 0744  GLUCAP 227* 246* 199* 138* 224*   Lipid Profile: No results for input(s): CHOL, HDL, LDLCALC, TRIG, CHOLHDL, LDLDIRECT in the last 72 hours. Thyroid Function Tests: No results for input(s): TSH, T4TOTAL, FREET4, T3FREE, THYROIDAB in the last 72 hours. Anemia Panel: No results for input(s): VITAMINB12, FOLATE, FERRITIN, TIBC, IRON, RETICCTPCT in the last 72 hours. Sepsis Labs: No results for  input(s): PROCALCITON, LATICACIDVEN in the last 168 hours.  Recent Results (from the past 240 hour(s))  Aerobic/Anaerobic Culture (surgical/deep wound)     Status: None   Collection Time: 02/09/20  3:00 PM   Specimen: Gallbladder; Abscess  Result Value Ref Range Status   Specimen Description   Final    GALL BLADDER Performed at [facility], [address], [place], [place] [address]    Special Requests   Final    NONE Performed at [facility], [address] [place], [place] [address]    Gram Stain   Final    ABUNDANT WBC PRESENT, PREDOMINANTLY PMN MODERATE GRAM NEGATIVE RODS    Culture   Final    FEW KLEBSIELLA PNEUMONIAE NO ANAEROBES ISOLATED Performed at [facility], [place] [address] [place], [place] [address]    Report Status 02/14/2020 FINAL  Final   Organism ID, Bacteria KLEBSIELLA PNEUMONIAE  Final      Susceptibility   Klebsiella pneumoniae - MIC*    AMPICILLIN >=32 RESISTANT Resistant     CEFAZOLIN <=4 SENSITIVE Sensitive     CEFEPIME <=0.12 SENSITIVE Sensitive     CEFTAZIDIME <=1 SENSITIVE Sensitive     CEFTRIAXONE <=0.25 SENSITIVE Sensitive     CIPROFLOXACIN <=0.25 SENSITIVE Sensitive     GENTAMICIN <=1 SENSITIVE Sensitive     IMIPENEM <=0.25 SENSITIVE Sensitive     TRIMETH/SULFA <=20  SENSITIVE Sensitive     AMPICILLIN/SULBACTAM 4 SENSITIVE Sensitive     PIP/TAZO <=4 SENSITIVE Sensitive     * FEW KLEBSIELLA PNEUMONIAE  MRSA PCR Screening     Status: None   Collection Time: 02/10/20 12:00 AM   Specimen: Nasal Mucosa; Nasopharyngeal  Result Value Ref Range Status   MRSA by PCR NEGATIVE NEGATIVE Final    Comment:        The GeneXpert MRSA Assay (FDA approved for NASAL specimens only), is one component of a comprehensive MRSA colonization surveillance program. It is not intended to diagnose MRSA infection nor to guide or monitor treatment for MRSA infections. Performed at [facility], [address],  [place], [place] [address]      Radiology Studies: No results found.   LOS: 11 days   Antonieta Pert, MD [facility]  02/18/2020, 12:07 PM

## 2020-02-18 NOTE — Progress Notes (Signed)
Physical Therapy Treatment Patient Details Name: Kenneth Osborn MRN: [id number] DOB: [birth year] Today's Date: 02/18/2020    History of Present Illness 64 y.o. 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  Pain is mostly across his chest with radiation to the right arm associated with fever, chills and fatigue.  He complains of nausea but denies having any abdominal pain.  Patient felt that he may have infection with coronavirus but denies having any known exposure.  He denies having any diaphoresis, no palpitations, no shortness of breath but he has felt dizzy and lightheaded.    PT Comments    Pt received in bed with wife present for session. Pt is able to complete all mobility with less assistance on this date compared to last PT session. Pt requires, at most, min assist for bed mobility & min assist +1 to ambulate 320 ft with RW. Pt does endorse slight dizziness but reports this is chronic and notes he's aggravated re: d/c plans. Based on pt's CLOF during this PT session, this PT anticipates pt can d/c home with HHPT f/u. PT educates pt & wife on this & pt is eager to return home but wife is concerned about inability to care for pt. PT offers to allow wife to assist pt with bed mobility (she notes she is concerned about being unable to assist him OOB) to practice in safe environment but she declines & pt reports it will be easier to complete bed mobility at home as he has an adjustable bed. PT also educated them on ability to purchase & install bed rails if they wish. Pt & wife report single step onto porch to enter house at front door & PT encourages them to temporarily use that entrance (vs garage entrance with 2 steps) & pt reports he has a lift  seat that can transport him from the first floor to 2nd. Will continue to follow pt acutely to focus on gait with LRAD, balance, strength & endurance training, and stair negotiation.     Follow Up Recommendations  Supervision for mobility/OOB;Home health PT     Equipment Recommendations  Rolling walker with 5" wheels    Recommendations for Other Services       Precautions / Restrictions Precautions Precautions: Fall Restrictions Weight Bearing Restrictions: No    Mobility  Bed Mobility Overal bed mobility: Needs Assistance Bed Mobility: Sidelying to Sit;Sit to Supine;Rolling Rolling: Min assist (cuing to place BUE on bed rail vs pulling on PT's hand) Sidelying to sit: Min guard (with bed rails)   Sit to supine: Supervision      Transfers Overall transfer level: Needs assistance Equipment used: Rolling walker (2 wheeled) Transfers: Sit to/from Stand Sit to Stand: Min assist            Ambulation/Gait Ambulation/Gait assistance: Herbalist (Feet): 320 Feet Assistive device: Rolling walker (2 wheeled) Gait Pattern/deviations: Step-through pattern;Decreased step length - right;Decreased step length - left;Decreased stride length Gait velocity: decreased   General Gait Details: cuing for side stepping in base of RW during narrow space between bed & counter with pt requring cuing to do so upon return to room after ambulating 2 laps but poor return demo & pt demonstrating decreased safety awareness   Stairs  Wheelchair Mobility    Modified Rankin (Stroke Patients Only)       Balance Overall balance assessment: Needs assistance Sitting-balance support: Feet supported;Bilateral upper extremity supported Sitting balance-Leahy Scale: Fair Sitting balance - Comments: supervision static sitting EOB     Standing balance-Leahy Scale: Fair Standing balance comment: BUE support on RW during gait                             Cognition Arousal/Alertness: Awake/alert Behavior During Therapy: WFL for tasks assessed/performed Overall Cognitive Status: Difficult to assess Area of Impairment: Memory;Attention;Following commands;Safety/judgement;Awareness;Problem solving                   Current Attention Level: Sustained           General Comments: Internally distracted & tangential speech throughout session despite cuing from PT to focus on task at hand.      Exercises      General Comments        Pertinent Vitals/Pain Pain Assessment: 0-10 Pain Score: 4  Pain Location: reddened area to R shin Pain Intervention(s): Premedicated before session (per pt report)    Home Living                      Prior Function            PT Goals (current goals can now be found in the care plan section) Acute Rehab PT Goals Patient Stated Goal: to get better and go home PT Goal Formulation: With patient Time For Goal Achievement: 02/29/20 Potential to Achieve Goals: Fair Progress towards PT goals: Progressing toward goals    Frequency    Min 2X/week      PT Plan Discharge plan needs to be updated    Co-evaluation              AM-PAC PT "6 Clicks" Mobility   Outcome Measure  Help needed turning from your back to your side while in a flat bed without using bedrails?: A Little Help needed moving from lying on your back to sitting on the side of a flat bed without using bedrails?: A Little Help needed moving to and from a bed to a chair (including a wheelchair)?: A Little Help needed standing up from a chair using your arms (e.g., wheelchair or bedside chair)?: A Little Help needed to walk in hospital room?: A Little Help needed climbing 3-5 steps with a railing? : A Little 6 Click Score: 18    End of Session Equipment Utilized During Treatment: Gait belt Activity Tolerance: Patient tolerated treatment well Patient left: in bed;with bed alarm set;with call bell/phone  within reach;with SCD's reapplied;with family/visitor present Nurse Communication: Mobility status PT Visit Diagnosis: Unsteadiness on feet (R26.81);Muscle weakness (generalized) (M62.81);History of falling (Z91.81);Dizziness and giddiness (R42)     Time: 5284-1324 PT Time Calculation (min) (ACUTE ONLY): 23 min  Charges:  $Therapeutic Activity: 23-37 mins                     Lavone Nian, PT, DPT 02/18/20, 12:14 PM    Waunita Schooner 02/18/2020, 12:11 PM

## 2020-02-21 ENCOUNTER — Encounter: Payer: Medicare Other | Admitting: Pain Medicine

## 2020-02-22 ENCOUNTER — Other Ambulatory Visit: Payer: Self-pay

## 2020-02-22 ENCOUNTER — Telehealth: Payer: Self-pay

## 2020-02-22 ENCOUNTER — Encounter: Payer: Self-pay | Admitting: Surgery

## 2020-02-22 ENCOUNTER — Ambulatory Visit (INDEPENDENT_AMBULATORY_CARE_PROVIDER_SITE_OTHER): Payer: Medicare Other | Admitting: Surgery

## 2020-02-22 VITALS — BP 111/77 | HR 112 | Temp 97.9°F | Ht 69.0 in | Wt 207.0 lb

## 2020-02-22 DIAGNOSIS — K810 Acute cholecystitis: Secondary | ICD-10-CM

## 2020-02-22 NOTE — Telephone Encounter (Signed)
Faxed medical clearance to Dr. [name] at [phone].  Faxed cardiac clearance to Hassell Done at [phone].

## 2020-02-22 NOTE — Progress Notes (Signed)
Surgical Clinic Progress/Follow-up Note   HPI:  65 y.o. Male presents to clinic for  follow-up.  2 weeks follow the last evaluation.  He was hospitalized for acute acalculous cholecystitis, which was percutaneously drained due to his other comorbidities.  Sepsis and septic shock set him back and put him on a ventilator following percutaneous cholecystostomy.  His ICU stay was significant.  Delighted to see he is home.  Patient reports annoying pain of the drain present which is uncomfortable when trying to sleep.  Patient reports improvement/resolution of prior chest pain issues and has been tolerating regular diet with +flatus and normal BM's, denies N/V, fever/chills, CP, or SOB. He is currently in a wheelchair, and presents still getting rehabilitation for generalized weakness.  Review of Systems:  Constitutional: denies fever/chills  ENT: denies sore throat, hearing problems  Respiratory: denies shortness of breath, wheezing  Cardiovascular: denies chest pain, palpitations  Gastrointestinal: denies abdominal pain, secondary to the drain. Skin: Denies any other rashes or skin discolorations as per interval history  Vital Signs:  BP 111/77   Pulse (!) 112   Temp 97.9 F (36.6 C) (Oral)   Ht 5' 9"  (1.753 m)   Wt 207 lb (93.9 kg)   SpO2 97%   BMI 30.57 kg/m    Physical Exam:  Constitutional:  -- Normal/Obese body habitus  -- Awake, alert, and oriented x3  Pulmonary:  -- No crackles -- Equal breath sounds bilaterally -- Breathing non-labored at rest Cardiovascular:  -- S1, S2 present  -- No pericardial rubs  Gastrointestinal:  -- Soft and well rounded, non-tender/with mild peri-drain tenderness to palpation, no guarding/rebound tenderness --Musculoskeletal / Integumentary:  -- Wounds or skin discoloration: None appreciated  -- Extremities: B/L UE and LE FROM, hands and feet warm,    Laboratory studies:   Imaging: No new pertinent imaging available for  review   Assessment:  65 y.o. yo Male with a problem list including...  Patient Active Problem List   Diagnosis Date Noted  . Elevated LFTs   . Renal mass   . Hypokalemia   . Acute acalculous cholecystitis 02/07/2020  . Severe sepsis ([place]) 02/07/2020  . Chronic low back pain (Bilateral) w/o sciatica 12/16/2019  . Hyperglycemia 12/16/2019  . Pharmacologic therapy 08/03/2019  . Disorder of skeletal system 08/03/2019  . Problems influencing health status 08/03/2019  . Postop check 12/17/2018  . Acute postoperative pain 12/03/2018  . DDD (degenerative disc disease), lumbosacral 09/21/2018  . Diabetic polyneuropathy associated with diabetes mellitus due to underlying condition ([place]) 09/10/2018  . Neuropathic pain 09/10/2018  . Musculoskeletal pain 09/10/2018  . AKI (acute kidney injury) ([place]) 02/19/2018  . Frequent falls 02/07/2018  . Hyperkalemia 01/18/2018  . Spondylosis without myelopathy or radiculopathy, lumbosacral region 11/13/2017  . Cardiac syncope 09/08/2017  . History of coronary artery disease 09/03/2017  . Spinal cord stimulator dysfunction ([place]) 12/26/2016  . Trigger point posterior superior iliac spine (PSIS) (Right) 12/25/2016  . 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 Polyneuropathy 12/18/2016  . Numbness and tingling 11/12/2016  . Tachycardia with heart rate 100-120 beats per minute 11/12/2016  . Meniere disease, bilateral 11/12/2016  . Headache disorder 11/12/2016  . Type 2 diabetes mellitus with diabetic neuropathy ([place]) 11/12/2016  . Type 2 diabetes mellitus with diabetic polyneuropathy, with long-term current use of insulin ([place]) 11/12/2016  . Tremor 09/11/2016  . Chronic pain syndrome 05/28/2016  . Bilateral carotid artery stenosis 01/24/2016  . Mild cognitive impairment 11/27/2015  . 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 Balance problem 10/09/2015  . Chronic tension-type headache, not intractable  10/09/2015  . Dizziness 10/09/2015  . Post-concussion headache 10/09/2015  . PSVT (paroxysmal supraventricular tachycardia) ([place]) 08/31/2015  . Diabetes mellitus, type II ([place]) 06/14/2015  . Unstable angina ([place]) 06/08/2015  . Encounter for interrogation of neurostimulator 03/22/2015  . [employer] spinal cord stimulator (implant date: 11/01/2014) 03/22/2015  . Type 2 diabetes mellitus with hyperglycemia ([place]) 03/15/2015  . Uncontrolled type 2 diabetes mellitus with hyperglycemia, with long-term current use of insulin ([place]) 03/15/2015  . DM type 2 with diabetic peripheral neuropathy ([place]) 03/15/2015  . Acid reflux 12/21/2014  . OSA (obstructive sleep apnea) 12/21/2014  . S/P insertion of spinal cord stimulator 12/21/2014  . Chronic low back pain (Bilateral) (L>R) w/ sciatica (Left) 12/21/2014  . 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 Beverely Pace at [facility] on 01/04/2013) 12/21/2014  . Long term current use of opiate analgesic 12/21/2014  . Long term prescription opiate use 12/21/2014  . Opiate use (60 MME/Day) 12/21/2014  . Opiate dependence ([place]) 12/21/2014  . Encounter for therapeutic drug level monitoring 12/21/2014  . Neurogenic pain 12/21/2014  . Thoracic facet syndrome (T8-10) 12/21/2014  . Lumbar facet syndrome (Bilateral) (R>L) 12/21/2014  . Cervical facet syndrome (Right) 12/21/2014  . Cervical spondylosis 12/21/2014  . Lumbar spondylosis 12/21/2014  . Chronic upper extremity pain (Left) 12/21/2014  . Chronic cervical radicular pain (Left) 12/21/2014  . Chronic upper back pain 12/21/2014  . History of thoracic spine surgery (S/P T9-10 IVD spacer) 12/21/2014  . 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 Chronic musculoskeletal pain 12/21/2014  . Myofascial pain 12/21/2014  . Chronic lower extremity pain (Left) 12/21/2014  . Chronic radicular lumbar pain (left L4 dermatomal pain) 12/21/2014  . Coronary artery disease 12/21/2014  . History of MI (myocardial  infarction) (May 2012) 12/21/2014  . Chronic anticoagulation ([place]) 12/21/2014  . History of Meniere's disease 12/21/2014  . Sleep apnea 12/21/2014  . History of spinal surgery 11/14/2014  . CKD (chronic kidney disease) stage 3, GFR 30-59 ml/min (HCC) 09/26/2014  . Hypotension due to hypovolemia 09/22/2014  . Benign essential HTN 09/14/2014  . Hypertension associated with diabetes ([place]) 09/14/2014  . Barrett esophagus 08/12/2014  . H/O adenomatous polyp of colon 08/12/2014  . Dysphagia 08/12/2014  . GERD (gastroesophageal reflux disease) 01/25/2014  . Arteriosclerosis of coronary artery 08/13/2013  . Hyperlipidemia 08/13/2013  . CAD (coronary artery disease) 08/13/2013  . Hyperlipidemia associated with type 2 diabetes mellitus ([place]) 08/13/2013  . Auditory vertigo 12/30/2012  . Cervical spinal stenosis 12/11/2012  . Arthralgia of shoulder 03/16/2012    presents to clinic for follow-up evaluation of acute cholecystitis following percutaneous cholecystostomy, progressing well.  Plan:              - return to clinic February 15, or as needed, instructed to call office if any questions or concerns  -Due to anniversary celebration, deferring follow-up till that date.  -Has notification to make appointment for follow-up with interventional radiology regarding drain follow-up.  -Seeking medical clearance for definitive robotic cholecystectomy and drain removal.  I am in no hurry as time is on my side to diminish the degree of inflammatory changes and scarring to the region.  -Noting that he ambulated via wheelchair today, I believe further rehab is in order, and would like to see a better exercise tolerance prior to considering this procedure.  We will place a tentative date of February  16, But readily willing to defer as needed to optimize this patient's risk status.  All of the above recommendations were discussed with the patient and patient's family, and all of patient's and family's  questions were answered to their expressed satisfaction.  Ronny Bacon, MD, FACS [place]: [place] for exceptional care. Office: [phone]

## 2020-02-22 NOTE — Patient Instructions (Signed)
Our surgery scheduler Pamala Hurry will call you within 24-48 with your surgery appointment. Please have the blue sheet available when she calls to write down important information. If you have not heard from her by Friday 01/07, please give our office a call. I have set you up with a follow up appointment. See appointment below.

## 2020-02-23 ENCOUNTER — Encounter: Payer: Self-pay | Admitting: Urology

## 2020-02-23 ENCOUNTER — Ambulatory Visit (INDEPENDENT_AMBULATORY_CARE_PROVIDER_SITE_OTHER): Payer: Medicare Other | Admitting: Urology

## 2020-02-23 ENCOUNTER — Telehealth: Payer: Self-pay | Admitting: Surgery

## 2020-02-23 VITALS — BP 114/76 | HR 99 | Ht 69.0 in | Wt 207.0 lb

## 2020-02-23 DIAGNOSIS — N2889 Other specified disorders of kidney and ureter: Secondary | ICD-10-CM

## 2020-02-23 NOTE — Progress Notes (Signed)
02/23/2020 12:28 PM   Kenneth Osborn Sep 29, 1955 [id number]  Reason for visit: Left 6 cm renal mass  HPI: I saw Kenneth Osborn and his wife in urology clinic for follow-up of a 6 cm left renal mass.  He was seen by Dr. Bernardo Heater as an inpatient.  He is a 65 year old male who was admitted to [facility] from 02/07/2020-02/18/2020 with sepsis from cholecystitis that was managed with percutaneous drain and antibiotics.  I personally reviewed the CT scan incidentally showed a 6 cm left upper pole enhancing renal mass worrisome for RCC.  There was no evidence of metastatic disease on CT chest abdomen pelvis.  Renal function at discharge was normal with a creatinine of 0.81, EGFR greater than 60, however he has a history of CKD stage III prior to this admission.  His past surgical history is notable for open appendectomy as a teenager.  He denies any smoking history.  He denies any gross hematuria.  General surgery is planning a robotic cholecystectomy in mid February.  We had a long conversation about his imaging findings, and concern for kidney cancer, most likely renal cell carcinoma.  We discussed treatment options at length including active surveillance, percutaneous ablation, radical nephrectomy, and partial nephrectomy.  With his relative good health otherwise enlarged mass, active surveillance and percutaneous ablation or not good options.  With his history of CKD, I think he would be best served with a robotic partial nephrectomy, however this would be quite challenging based on the location and size of the mass.  I reviewed his case with my partner Dr. Erlene Quan, and she feels he would be best served with a robotic partial nephrectomy at a tertiary care center in either [place] or [place].  I discussed this at length with the patient.   With the mass being on the opposite side from his upcoming cholecystectomy, and being a complex partial nephrectomy, I think it is very reasonable to stage these  procedures.  Referral placed to Dr. Alinda Money in [place] for consideration and timing of left robotic partial nephrectomy  I spent 45 total minutes on the day of the encounter including pre-visit review of the medical record, face-to-face time with the patient, and post visit ordering of labs/imaging/tests.  [place], [facility] [address], [place], [place] [address] [phone]

## 2020-02-23 NOTE — Patient Instructions (Signed)
Kidney Cancer  Kidney cancer is an abnormal growth of cells in one or both kidneys. The kidneys filter waste from your blood and produce urine. Kidney cancer may spread to other parts of your body. This type of cancer may also be called renal cell carcinoma. What are the causes? The cause of this condition is not always known. In some cases, abnormal changes to genes (genetic mutations) can cause cells to form cancer. What increases the risk? You may be more likely to develop kidney cancer if you:  Are over age 40. The risk increases with age.  Have a family history of kidney cancer.  Are of African-American, Native American, or Native [place] descent.  Smoke.  Are male.  Are obese.  Have high blood pressure (hypertension).  Have advanced kidney disease, especially if you need long-term dialysis.  Have certain conditions that are passed from parent to child (inherited), such as von Hippel-Lindau disease, tuberous sclerosis, or hereditary papillary renal carcinoma.  Have been exposed to certain chemicals. What are the signs or symptoms? In the early stages, kidney cancer does not cause symptoms. As the cancer grows, symptoms may include:  Blood in the urine.  Pain in the upper back or abdomen, just below the rib cage. You may feel pain on one or both sides of the body.  Fatigue.  Unexplained weight loss.  Fever. How is this diagnosed? This condition may be diagnosed based on:  Your symptoms and medical history.  A physical exam.  Blood and urine tests.  X-rays.  Imaging tests, such as CT scans, MRIs, and PET scans.  Having dye injected into your blood through an IV, and then having X-rays taken of: ? Your kidneys and the rest of the organs involved in making and storing urine (intravenous pyelogram). ? Your blood vessels (angiogram).  Removal and testing of a kidney tissue sample (biopsy). Your cancer will be assessed (staged), based on how severe it is and how  much it has spread. How is this treated? Treatment depends on the type and stage of the cancer. Treatment may include one or more of the following:  Surgery. This may include surgery to remove: ? Just the tumor (nephron-sparing surgery). ? The entire kidney (nephrectomy). ? The kidney, some of the surrounding healthy tissue, nearby lymph nodes, and the adrenal gland in certain cases (radical nephrectomy).  Medicines that kill cancer cells (chemotherapy).  High-energy rays that kill cancer cells (radiation therapy).  Targeted therapy. This targets specific parts of cancer cells and the area around them to block the growth and the spread of the cancer. Targeted therapy can help to limit the damage to healthy cells.  Medicines that help your body's disease-fighting system (immune system) fight cancer cells (immunotherapy).  Freezing cancer cells using gas or liquid that is delivered through a needle (cryoablation).  Destroying cancer cells using high-energy radio waves that are delivered through a needle-like probe (radiofrequency ablation).  A procedure to block the artery that supplies blood to the tumor, which kills the cancer cells (embolization). Follow these instructions at home: Eating and drinking  Some of your treatments might affect your appetite and your ability to chew and swallow. If you are having problems eating, or if you do not have an appetite, meet with a diet and nutrition specialist (dietitian).  If you have side effects that affect eating, it may help to: ? Eat smaller meals and snacks often. ? Drink high-nutrition and high-calorie shakes or supplements. ? Eat bland and soft  foods that are easy to eat. ? Not eat foods that are hot, spicy, or hard to swallow. Lifestyle  Do not drink alcohol.  Do not use any products that contain nicotine or tobacco, such as cigarettes and e-cigarettes. If you need help quitting, ask your health care provider. General  instructions   Take over-the-counter and prescription medicines only as told by your health care provider. This includes vitamins, supplements, and herbal products.  Consider joining a support group to help you cope with the stress of having kidney cancer.  Work with your health care provider to manage any side effects of treatment.  Keep all follow-up visits as told by your health care provider. This is important. Where to find more information  American Cancer Society: https://www.cancer.[place] ([place]): https://www.cancer.gov Contact a health care provider if you:  Notice that you bruise or bleed easily.  Are losing weight without trying.  Have new or increased fatigue or weakness. Get help right away if you have:  Blood in your urine.  A sudden increase in pain.  A fever.  Shortness of breath.  Chest pain.  Yellow skin or whites of your eyes (jaundice). Summary  Kidney cancer is an abnormal growth of cells (tumor) in one or both kidneys. Tumors may spread to other parts of your body.  In the early stages, kidney cancer does not cause symptoms. As the cancer grows, symptoms may include blood in the urine, pain in the upper back or abdomen, unexplained weight loss, fatigue, and fever.  Treatment depends on the type and stage of the cancer. It may include surgery to remove the tumor, procedures and medicines to kill the cancer cells, or medicines to help your body fight cancer cells. This information is not intended to replace advice given to you by your health care provider. Make sure you discuss any questions you have with your health care provider. Document Revised: 02/26/2017 Document Reviewed: 02/23/2017 Elsevier Patient Education  [place].   Minimally Invasive Nephrectomy Minimally invasive nephrectomy is a surgical procedure to remove a kidney. This procedure is done through several small incisions in the abdomen. You may have  surgery to remove:  The entire kidney and some surrounding structures (radical nephrectomy).  Only the damaged or diseased part of the kidney (partial nephrectomy). You may need this surgery if:  Your kidney is severely damaged from kidney disease, infection, or cancer.  You were born with an abnormal kidney.  You are donating a healthy kidney. Tell a health care provider about:  Any allergies you have.  All medicines you are taking, including vitamins, herbs, eye drops, creams, and over-the-counter medicines.  Any problems you or family members have had with anesthetic medicines.  Any blood disorders you have.  Any surgeries you have had.  Any medical conditions you have.  Whether you are pregnant or may be pregnant. What are the risks? Generally, this is a safe procedure. However, problems may occur, including:  Bleeding.  Infection.  Pneumonia.  Damage to other body structures near the kidney.  Allergic reactions to medicines. A bone (usually part of a rib) or more tissue may need to be removed so the surgeon can get to the kidney. If this happens, the minimally invasive procedure may need to be changed to an open procedure. What happens before the procedure? Staying hydrated Follow instructions from your health care provider about hydration, which may include:  Up to 2 hours before the procedure - you may continue  to drink clear liquids, such as water, clear fruit juice, black coffee, and plain tea.  Eating and drinking restrictions Follow instructions from your health care provider about eating and drinking, which may include:  8 hours before the procedure - stop eating heavy meals or foods, such as meat, fried foods, or fatty foods.  6 hours before the procedure - stop eating light meals or foods, such as toast or cereal.  6 hours before the procedure - stop drinking milk or drinks that contain milk.  2 hours before the procedure - stop drinking clear  liquids. Medicines Ask your health care provider about:  Changing or stopping your regular medicines. This is especially important if you are taking diabetes medicines or blood thinners.  Taking medicines such as aspirin and ibuprofen. These medicines can thin your blood. Do not take these medicines unless your health care provider tells you to take them.  Taking over-the-counter medicines, vitamins, herbs, and supplements. General instructions  Plan to have someone take you home from the hospital or clinic.  Do not use any products that contain nicotine or tobacco for at least 4 weeks before the procedure. These products include cigarettes, e-cigarettes, and chewing tobacco. If you need help quitting, ask your health care provider.  Ask your health care provider: ? How your surgery site will be marked. ? What steps will be taken to help prevent infection. These may include:  Removing hair at the surgery site.  Washing skin with a germ-killing soap.  Taking antibiotic medicine. What happens during the procedure?     Before surgery begins  An IV will be inserted into one of your veins.  You will be given a medicine to make you fall asleep (general anesthetic).  Tight-fitting stockings (compression stockings) may be placed on your legs to help blood flow.  A small, thin tube will be placed in your bladder (urinary catheter) to drain urine.  A tube will be placed through your nose and into your stomach (nasogastric tube) to drain stomach fluids. Surgery  A small incision will be made in your abdomen to insert a thin, lighted tube (laparoscope) with a camera attached. This lets your surgeon see your kidney during the procedure.  More small incisions will be made to insert other surgical tools. One incision may be slightly larger to remove your kidney. ? In some cases, the surgeon will do the procedure by controlling tools that are attached to a surgical robot. This is called a  robot-assisted nephrectomy.  Depending on the type of procedure you are having, one of the following will be done: ? If you are having a partial nephrectomy:  The blood vessels attached to your kidney will be clamped.  Part of your kidney will be removed. The kidney will be closed with stitches (sutures), and the clamp on the blood vessels will be removed. ? If you are having a radical nephrectomy:  All of the blood vessels that attach to your kidney will be closed and cut.  Part of the tube that carries urine from your kidney to your bladder (ureter) will be removed.  Your kidney will be removed.  All of the surgical tools will be removed from your body.  A small tube (drain) may be placed near one of the incisions to drain extra fluid from the surgical area.  The incisions may be closed with sutures or another type of closure.  A bandage (dressing) will be placed over the incision area. The procedure may vary  among health care providers and hospitals. What happens after the procedure?  Your blood pressure, heart rate, breathing rate, and blood oxygen level will be monitored until you leave the hospital or clinic.  Your nasogastric tube will be removed.  You may continue to have: ? An IV until you can drink fluids on your own. ? A urinary catheter. Your urine output will be checked. ? A drain. Your surgical drain will be removed after a few days. Your health care provider will instruct you how to care for the drain at home. ? Pain medicine. This will be given through your IV.  You will be encouraged to: ? Get out of bed and walk as soon as possible. ? Do breathing exercises, such as coughing and breathing deeply. This helps prevent pneumonia. Summary  Minimally invasive nephrectomy is a surgical procedure to remove a kidney. In some cases, only the damaged or diseased part of the kidney is removed.  This procedure is done through several small incisions in the  abdomen.  Follow instructions from your health care provider about taking medicines and about eating and drinking before the procedure.  You will be given a medicine to make you fall asleep (general anesthetic) during the procedure. This information is not intended to replace advice given to you by your health care provider. Make sure you discuss any questions you have with your health care provider. Document Revised: 03/03/2018 Document Reviewed: 03/03/2018 Elsevier Patient Education  [place].

## 2020-02-23 NOTE — Telephone Encounter (Signed)
Both patient and his wife have been advised of Pre-Admission date/time, COVID Testing date and Surgery date.  Surgery Date: 04/05/20 Preadmission Testing Date: 03/29/20 (phone 8a-1p) Covid Testing Date: 04/03/20 - patient advised to go to the [facility] ([place] between 8a-1p  Patient has been made aware to call [phone], between 1-3:00pm the day before surgery, to find out what time to arrive for surgery.

## 2020-02-24 ENCOUNTER — Ambulatory Visit: Payer: Medicare Other | Attending: Pain Medicine | Admitting: Pain Medicine

## 2020-02-24 ENCOUNTER — Encounter: Payer: Self-pay | Admitting: Pain Medicine

## 2020-02-24 ENCOUNTER — Other Ambulatory Visit: Payer: Self-pay

## 2020-02-24 ENCOUNTER — Other Ambulatory Visit: Payer: Self-pay | Admitting: Surgery

## 2020-02-24 VITALS — BP 103/71 | HR 87 | Temp 98.1°F | Resp 16 | Ht 69.0 in | Wt 202.0 lb

## 2020-02-24 DIAGNOSIS — M960 Pseudarthrosis after fusion or arthrodesis: Secondary | ICD-10-CM | POA: Insufficient documentation

## 2020-02-24 DIAGNOSIS — G8929 Other chronic pain: Secondary | ICD-10-CM | POA: Insufficient documentation

## 2020-02-24 DIAGNOSIS — M79605 Pain in left leg: Secondary | ICD-10-CM | POA: Insufficient documentation

## 2020-02-24 DIAGNOSIS — M5442 Lumbago with sciatica, left side: Secondary | ICD-10-CM | POA: Insufficient documentation

## 2020-02-24 DIAGNOSIS — M961 Postlaminectomy syndrome, not elsewhere classified: Secondary | ICD-10-CM | POA: Insufficient documentation

## 2020-02-24 DIAGNOSIS — Z79899 Other long term (current) drug therapy: Secondary | ICD-10-CM | POA: Insufficient documentation

## 2020-02-24 DIAGNOSIS — F1120 Opioid dependence, uncomplicated: Secondary | ICD-10-CM

## 2020-02-24 DIAGNOSIS — G894 Chronic pain syndrome: Secondary | ICD-10-CM | POA: Diagnosis not present

## 2020-02-24 DIAGNOSIS — K810 Acute cholecystitis: Secondary | ICD-10-CM

## 2020-02-24 MED ORDER — OXYCODONE HCL 10 MG PO TABS: 10.0000 mg | ORAL_TABLET | Freq: Four times a day (QID) | ORAL | 0 refills | Status: DC | PRN

## 2020-02-24 NOTE — Patient Instructions (Addendum)
____________________________________________________________________________________________  Medication Rules  Purpose: To inform patients, and their family members, of our rules and regulations.  Applies to: All patients receiving prescriptions (written or electronic).  Pharmacy of record: Pharmacy where electronic prescriptions will be sent. If written prescriptions are taken to a different pharmacy, please inform the nursing staff. The pharmacy listed in the electronic medical record should be the one where you would like electronic prescriptions to be sent.  Electronic prescriptions: In compliance with the [place] (STOP) Act of 2017 (Session Lanny Cramp [phone]), effective February 18, 2018, all controlled substances must be electronically prescribed. Calling prescriptions to the pharmacy will cease to exist.  Prescription refills: Only during scheduled appointments. Applies to all prescriptions.  NOTE: The following applies primarily to controlled substances (Opioid* Pain Medications).   Type of encounter (visit): For patients receiving controlled substances, face-to-face visits are required. (Not an option or up to the patient.)  Patient's responsibilities: 1. Pain Pills: Bring all pain pills to every appointment (except for procedure appointments). 2. Pill Bottles: Bring pills in original pharmacy bottle. Always bring the newest bottle. Bring bottle, even if empty. 3. Medication refills: You are responsible for knowing and keeping track of what medications you take and those you need refilled. The day before your appointment: write a list of all prescriptions that need to be refilled. The day of the appointment: give the list to the admitting nurse. Prescriptions will be written only during appointments. No prescriptions will be written on procedure days. If you forget a medication: it will not be "Called in", "Faxed", or "electronically sent".  You will need to get another appointment to get these prescribed. No early refills. Do not call asking to have your prescription filled early. 4. Prescription Accuracy: You are responsible for carefully inspecting your prescriptions before leaving our office. Have the discharge nurse carefully go over each prescription with you, before taking them home. Make sure that your name is accurately spelled, that your address is correct. Check the name and dose of your medication to make sure it is accurate. Check the number of pills, and the written instructions to make sure they are clear and accurate. Make sure that you are given enough medication to last until your next medication refill appointment. 5. Taking Medication: Take medication as prescribed. When it comes to controlled substances, taking less pills or less frequently than prescribed is permitted and encouraged. Never take more pills than instructed. Never take medication more frequently than prescribed.  6. Inform other Doctors: Always inform, all of your healthcare providers, of all the medications you take. 7. Pain Medication from other Providers: You are not allowed to accept any additional pain medication from any other Doctor or Healthcare provider. There are two exceptions to this rule. (see below) In the event that you require additional pain medication, you are responsible for notifying us, as stated below. 8. Cough Medicine: Often these contain an opioid, such as codeine or hydrocodone. Never accept or take cough medicine containing these opioids if you are already taking an opioid* medication. The combination may cause respiratory failure and death. 9. Medication Agreement: You are responsible for carefully reading and following our Medication Agreement. This must be signed before receiving any prescriptions from our practice. Safely store a copy of your signed Agreement. Violations to the Agreement will result in no further prescriptions.  (Additional copies of our Medication Agreement are available upon request.) 10. Laws, Rules, & Regulations: All patients are expected to follow all  Federal and [employer], [employer], [employer], [employer]. Ignorance of the Laws does not constitute a valid excuse.  11. Illegal drugs and Controlled Substances: The use of illegal substances (including, but not limited to marijuana and its derivatives) and/or the illegal use of any controlled substances is strictly prohibited. Violation of this rule may result in the immediate and permanent discontinuation of any and all prescriptions being written by our practice. The use of any illegal substances is prohibited. 12. Adopted CDC guidelines & recommendations: Target dosing levels will be at or below 60 MME/day. Use of benzodiazepines** is not recommended.  Exceptions: There are only two exceptions to the rule of not receiving pain medications from other Healthcare Providers. 1. Exception #1 (Emergencies): In the event of an emergency (i.e.: accident requiring emergency care), you are allowed to receive additional pain medication. However, you are responsible for: As soon as you are able, call our office [phone], at any time of the day or night, and leave a message stating your name, the date and nature of the emergency, and the name and dose of the medication prescribed. In the event that your call is answered by a member of our staff, make sure to document and save the date, time, and the name of the person that took your information.  2. Exception #2 (Planned Surgery): In the event that you are scheduled by another doctor or dentist to have any type of surgery or procedure, you are allowed (for a period no longer than 30 days), to receive additional pain medication, for the acute post-op pain. However, in this case, you are responsible for picking up a copy of our "Post-op Pain Management for Surgeons" handout, and giving it to your surgeon or dentist. This  document is available at our office, and does not require an appointment to obtain it. Simply go to our office during business hours (Monday-Thursday from 8:00 AM to 4:00 PM) (Friday 8:00 AM to 12:00 Noon) or if you have a scheduled appointment with [name], prior to your surgery, and ask for it by name. In addition, you are responsible for: calling our office [phone], at any time of the day or night, and leaving a message stating your name, name of your surgeon, type of surgery, and date of procedure or surgery. Failure to comply with your responsibilities may result in termination of therapy involving the controlled substances.  *Opioid medications include: morphine, codeine, oxycodone, oxymorphone, hydrocodone, hydromorphone, meperidine, tramadol, tapentadol, buprenorphine, fentanyl, methadone. **Benzodiazepine medications include: diazepam (Valium), alprazolam (Xanax), clonazepam (Klonopine), lorazepam (Ativan), clorazepate (Tranxene), chlordiazepoxide (Librium), estazolam (Prosom), oxazepam (Serax), temazepam (Restoril), triazolam (Halcion) (Last updated: 01/17/2020) ____________________________________________________________________________________________   ____________________________________________________________________________________________  Medication Recommendations and Reminders  Applies to: All patients receiving prescriptions (written and/or electronic).  Medication Rules & Regulations: These rules and regulations exist for your safety and that of others. They are not flexible and neither are we. Dismissing or ignoring them will be considered "non-compliance" with medication therapy, resulting in complete and irreversible termination of such therapy. (See document titled "Medication Rules" for more details.) In all conscience, because of safety reasons, we cannot continue providing a therapy where the patient does not follow instructions.  Pharmacy of record:   Definition:  This is the pharmacy where your electronic prescriptions will be sent.   We do not endorse any particular pharmacy, however, we have experienced problems with [facility] not securing enough medication supply for the community.  We do not restrict you in your choice of pharmacy. However,  once we write for your prescriptions, we will NOT be re-sending more prescriptions to fix restricted supply problems created by your pharmacy, or your insurance.   The pharmacy listed in the electronic medical record should be the one where you want electronic prescriptions to be sent.  If you choose to change pharmacy, simply notify our nursing staff.  Recommendations:  Keep all of your pain medications in a safe place, under lock and key, even if you live alone. We will NOT replace lost, stolen, or damaged medication.  After you fill your prescription, take 1 week's worth of pills and put them away in a safe place. You should keep a separate, properly labeled bottle for this purpose. The remainder should be kept in the original bottle. Use this as your primary supply, until it runs out. Once it's gone, then you know that you have 1 week's worth of medicine, and it is time to come in for a prescription refill. If you do this correctly, it is unlikely that you will ever run out of medicine.  To make sure that the above recommendation works, it is very important that you make sure your medication refill appointments are scheduled at least 1 week before you run out of medicine. To do this in an effective manner, make sure that you do not leave the office without scheduling your next medication management appointment. Always ask the nursing staff to show you in your prescription , when your medication will be running out. Then arrange for the receptionist to get you a return appointment, at least 7 days before you run out of medicine. Do not wait until you have 1 or 2 pills left, to come in. This is very poor planning and  does not take into consideration that we may need to cancel appointments due to bad weather, sickness, or emergencies affecting our staff.  DO NOT ACCEPT A "Partial Fill": If for any reason your pharmacy does not have enough pills/tablets to completely fill or refill your prescription, do not allow for a "partial fill". The law allows the pharmacy to complete that prescription within 72 hours, without requiring a new prescription. If they do not fill the rest of your prescription within those 72 hours, you will need a separate prescription to fill the remaining amount, which we will NOT provide. If the reason for the partial fill is your insurance, you will need to talk to the pharmacist about payment alternatives for the remaining tablets, but again, DO NOT ACCEPT A PARTIAL FILL, unless you can trust your pharmacist to obtain the remainder of the pills within 72 hours.  Prescription refills and/or changes in medication(s):   Prescription refills, and/or changes in dose or medication, will be conducted only during scheduled medication management appointments. (Applies to both, written and electronic prescriptions.)  No refills on procedure days. No medication will be changed or started on procedure days. No changes, adjustments, and/or refills will be conducted on a procedure day. Doing so will interfere with the diagnostic portion of the procedure.  No phone refills. No medications will be "called into the pharmacy".  No Fax refills.  No weekend refills.  No Holliday refills.  No after hours refills.  Remember:  Business hours are:  Monday to Thursday 8:00 AM to 4:00 PM Provider's Schedule: Milinda Pointer, MD - Appointments are:  Medication management: Monday and Wednesday 8:00 AM to 4:00 PM Procedure day: Tuesday and Thursday 7:30 AM to 4:00 PM Gillis Santa, MD - Appointments are:  Medication management: Tuesday and Thursday 8:00 AM to 4:00 PM Procedure day: Monday and Wednesday  7:30 AM to 4:00 PM (Last update: 09/08/2019) ____________________________________________________________________________________________   ____________________________________________________________________________________________  CBD (cannabidiol) WARNING  Applicable to: All individuals currently taking or considering taking CBD (cannabidiol) and, more important, all patients taking opioid analgesic controlled substances (pain medication). (Example: oxycodone; oxymorphone; hydrocodone; hydromorphone; morphine; methadone; tramadol; tapentadol; fentanyl; buprenorphine; butorphanol; dextromethorphan; meperidine; codeine; etc.)  Legal status: CBD remains a Schedule I drug prohibited for any use. CBD is illegal with one exception. In the [place], CBD has a limited Transport planner (FDA) approval for the treatment of two specific types of epilepsy disorders. Only one CBD product has been approved by the FDA for this purpose: "Epidiolex". FDA is aware that some companies are marketing products containing cannabis and cannabis-derived compounds in ways that violate the [employer], Drug and Cosmetic Act [facility] Act) and that may put the health and safety of consumers at risk. The FDA, a Federal agency, has not enforced the CBD status since 2018.   Legality: Some manufacturers ship CBD products nationally, which is illegal. Often such products are sold online and are therefore available throughout the country. CBD is openly sold in head shops and health food stores in some states where such sales have not been explicitly legalized. Selling unapproved products with unsubstantiated therapeutic claims is not only a violation of the law, but also can put patients at risk, as these products have not been proven to be safe or effective. Federal illegality makes it difficult to conduct research on CBD.  Reference: "FDA Regulation of Cannabis and Cannabis-Derived Products, Including Cannabidiol  (CBD)" - [employer]  Warning: CBD is not FDA approved and has not undergo the same manufacturing controls as prescription drugs.  This means that the purity and safety of available CBD may be questionable. Most of the time, despite manufacturer's claims, it is contaminated with THC (delta-9-tetrahydrocannabinol - the chemical in marijuana responsible for the "HIGH").  When this is the case, the [facility] contaminant will trigger a positive urine drug screen (UDS) test for Marijuana (carboxy-THC). Because a positive UDS for any illicit substance is a violation of our medication agreement, your opioid analgesics (pain medicine) may be permanently discontinued.  MORE ABOUT CBD  General Information: CBD  is a derivative of the Marijuana (cannabis sativa) plant discovered in 47. It is one of the 113 identified substances found in Marijuana. It accounts for up to 40% of the plant's extract. As of 2018, preliminary clinical studies on CBD included research for the treatment of anxiety, movement disorders, and pain. CBD is available and consumed in multiple forms, including inhalation of smoke or vapor, as an aerosol spray, and by mouth. It may be supplied as an oil containing CBD, capsules, dried cannabis, or as a liquid solution. CBD is thought not to be as psychoactive as THC (delta-9-tetrahydrocannabinol - the chemical in marijuana responsible for the "HIGH"). Studies suggest that CBD may interact with different biological target receptors in the body, including cannabinoid and other neurotransmitter receptors. As of 2018 the mechanism of action for its biological effects has not been determined.  Side-effects  Adverse reactions: Dry mouth, diarrhea, decreased appetite, fatigue, drowsiness, malaise, weakness, sleep disturbances, and others.  Drug interactions: CBC may interact with other  medications such as blood-thinners. (Last update: 09/25/2019) ____________________________________________________________________________________________   Post-op Pain Management on a Chronic Pain Patient  Why should the surgeon manage his patient's post-op pain? The Surgeon is uniquely qualified  to determine the amount of post-operative pain to be expected on a procedure or surgery that he/she has performed. Even with similar surgeries, the surgeon's perspective on expected pain is unique, since he/she performed the procedure and knows the degree of difficulty and/or tissue damage involved in each particular case. The surgeon is also up to date on events such as blood loss, intraoperative complications, and PO (per orum) status that may influence not only the patient's dose and schedule, but route of administration as well.  How about telling chronic pain patients to just double up or increase their usual pain medication intake to compensate for the increased pain? This is a bad idea since it will lead to the patient running out of his/her usual medications early and this may create a problem at the level of the insurance, which supplies medications based on the amount and schedule stated on the prescription. Running out early may trigger an event where the refill is denied by the insurance company and/or pharmacy. In addition, this practice provides a very poor paper trail as to why this patient ran out of medication early. In addition, from the perspective of the pain physician, it creates a nightmare in the accounting of the patient's medication.  So, what should I do as a Psychologist, sport and exercise when confronted with a patient that needs surgery and already takes a significant amount of pain medicine for their chronic pain, which may or may not be related to the surgery I have to perform?  This is what the surgeon should do: 1. Do not change the dose or schedule of the pain medications prescribed by the pain  specialist. This medication regimen allows for the patient's chronic pain to be under control, so as to bring that patient down to the level of an average individual. 2. Have the patient continue their usual pain management regimen, without any alterations. In addition, manage the post-operative pain as you would on any other "narcotic naive" patient. Do not attempt to compensate for tolerance. This is what the patient's usual regimen will do for you. Simply treat the patient as if they had no chronic pain and as if they were taking no other pain medications. 3. Talk to the patient about the medication, just like you would for anyone else. Do not assume that they are experts in opioids. Make sure you let the patient know that the medication is to be used only if absolutely necessary. (PRN) 4. Prescribe the medication for as long as you would on any other patient undergoing the same type of surgery. Prescribe for the same average amount of time that you would on any other patient. Avoid prescribing for longer periods. 5. Send [name] a copy of the operative report with information about your choice of the post-op pain medication provided. 6. Keep [name] informed of any complications that may prolong the average duration patient's post-op pain.  If you have any questions, please feel to contact us at [phone]. _____________________________________________________________________________________________

## 2020-02-24 NOTE — Progress Notes (Signed)
Received Cardiac Clearance from Hassell Done, NP. The patient is optimized for surgery.

## 2020-02-24 NOTE — Progress Notes (Signed)
Nursing Pain Medication Assessment:  Safety precautions to be maintained throughout the outpatient stay will include: orient to surroundings, keep bed in low position, maintain call bell within reach at all times, provide assistance with transfer out of bed and ambulation.  Medication Inspection Compliance: Pill count conducted under aseptic conditions, in front of the patient. Neither the pills nor the bottle was removed from the patient's sight at any time. Once count was completed pills were immediately returned to the patient in their original bottle.  Medication: Oxycodone IR Pill/Patch Count: 7 of 120 pills remain Pill/Patch Appearance: Markings consistent with prescribed medication Bottle Appearance: Standard pharmacy container. Clearly labeled. Filled Date: 10 / 15 / 21 Last Medication intake:  TodaySafety precautions to be maintained throughout the outpatient stay will include: orient to surroundings, keep bed in low position, maintain call bell within reach at all times, provide assistance with transfer out of bed and ambulation.

## 2020-02-24 NOTE — Progress Notes (Signed)
PROVIDER NOTE: Information contained herein reflects review and annotations entered in association with encounter. Interpretation of such information and data should be left to medically-trained personnel. Information provided to patient can be located elsewhere in the medical record under "Patient Instructions". Document created using STT-dictation technology, any transcriptional errors that may result from process are unintentional.    Patient: Kenneth Osborn  Service Category: E/M  Provider: Gaspar Cola, MD  DOB: [birth year]  DOS: 02/24/2020  Specialty: Interventional Pain Management  MRN: [id number]  Setting: Ambulatory outpatient  PCP: [name], MD  Type: Established Patient    Referring Provider: [name], MD  Location: Office  Delivery: Face-to-face     HPI  Kenneth Osborn, a 65 y.o. year old male, is here today because of his Chronic pain syndrome [G89.4]. Kenneth Osborn primary complain today is Back Pain (Lower back) Last encounter: My last encounter with him was on 02/16/2020. 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 Beverely Pace at [facility] on 01/04/2013); Neurogenic pain; Thoracic facet syndrome (T8-10); Lumbar facet syndrome (Bilateral) (R>L); Cervical facet syndrome (Right); Cervical spondylosis; Lumbar spondylosis; Chronic upper extremity pain (Left); Chronic cervical radicular pain (Left); Chronic upper back pain; History of thoracic spine surgery (S/P T9-10 IVD spacer); Failed back surgical syndrome; Chronic musculoskeletal pain; Myofascial pain; Chronic lower extremity pain (Left); Chronic radicular lumbar pain (left L4 dermatomal pain); Arthralgia of shoulder; Chronic tension-type headache, not intractable; 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); Chronic  pain syndrome; Headache disorder; Trigger point posterior superior iliac spine (PSIS) (Right); 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 Pain Assessment: Severity of Chronic pain is reported as a 5 /10. Location: Back Lower,Right,Left/denies. Quality: Aching,Burning,Shooting,Throbbing. Modifying factor(s): meds and resting. Vitals:  height is _0  (1.753 m) and weight is 202 lb (91.6 kg). His temporal temperature is 98.1 F (36.7 C). His blood pressure is 103/71 and his pulse is 87. His respiration is 16 and oxygen saturation is 100%.   Reason for encounter: medication management.   The patient indicates doing well with the current medication regimen. No adverse reactions or side effects reported to the medications. PMP & UDS compliant.  Since the patient's last visit to our practice, he ended up hospitalized due to what he thought was cardiac problems but it turned out to be his gallbladder.  He currently has a drain and he is scheduled to have a cholecystectomy.  Reviewing the PMP revealed that on 11/22/2019 I had provided him with 3 prescriptions for oxycodone but he has only had 2 of those filled.  Assuming that he gets the third 1 filled today, then his next refill should be 30 days from now.  The prescriptions that he is given today we will reflect this.  Pharmacotherapy Assessment   Analgesic: Oxycodone IR 10 mg, 1 tab PO q 6 hrs (40 mg/day of oxycodone) MME/day:60 mg/day.   Monitoring: [place] PMP: PDMP reviewed  during this encounter.       Pharmacotherapy: No  side-effects or adverse reactions reported. Compliance: No problems identified. Effectiveness: Clinically acceptable.  Chauncey Fischer, RN  02/24/2020 11:22 AM  Sign when Signing Visit Nursing Pain Medication Assessment:  Safety precautions to be maintained throughout the outpatient stay will include: orient to surroundings, keep bed in low position, maintain call bell within reach at all times, provide assistance with transfer out of bed and ambulation.  Medication Inspection Compliance: Pill count conducted under aseptic conditions, in front of the patient. Neither the pills nor the bottle was removed from the patient's sight at any time. Once count was completed pills were immediately returned to the patient in their original bottle.  Medication: Oxycodone IR Pill/Patch Count: 7 of 120 pills remain Pill/Patch Appearance: Markings consistent with prescribed medication Bottle Appearance: Standard pharmacy container. Clearly labeled. Filled Date: 10 / 15 / 21 Last Medication intake:  TodaySafety precautions to be maintained throughout the outpatient stay will include: orient to surroundings, keep bed in low position, maintain call bell within reach at all times, provide assistance with transfer out of bed and ambulation.     UDS:  Summary  Date Value Ref Range Status  03/18/2018 FINAL  Final    Comment:    ==================================================================== TOXASSURE SELECT 13 (MW) ==================================================================== Test                             Result       Flag       Units Drug Present and Declared for Prescription Verification   Desmethyldiazepam              123          EXPECTED   ng/mg creat   Oxazepam                       >897         EXPECTED   ng/mg creat   Temazepam                      291          EXPECTED   ng/mg creat    Desmethyldiazepam, oxazepam, and temazepam are  expected    metabolites of diazepam. Desmethyldiazepam and oxazepam are also    expected metabolites of other drugs, including chlordiazepoxide,    prazepam, clorazepate, and halazepam. Oxazepam is an expected    metabolite of temazepam. Oxazepam and temazepam are also    available as scheduled prescription medications.   Oxycodone                      1956         EXPECTED   ng/mg creat   Oxymorphone                    4052         EXPECTED   ng/mg creat   Noroxycodone                   957          EXPECTED   ng/mg creat   Noroxymorphone                 485          EXPECTED   ng/mg creat    Sources of oxycodone are scheduled prescription medications.    Oxymorphone, noroxycodone, and noroxymorphone are expected  metabolites of oxycodone. Oxymorphone is also available as a    scheduled prescription medication. Drug Absent but Declared for Prescription Verification   Alprazolam                     Not Detected UNEXPECTED ng/mg creat ==================================================================== Test                      Result    Flag   Units      Ref Range   Creatinine              223              mg/dL      >=20 ==================================================================== Declared Medications:  The flagging and interpretation on this report are based on the  following declared medications.  Unexpected results may arise from  inaccuracies in the declared medications.  **Note: The testing scope of this panel includes these medications:  Alprazolam (Xanax)  Diazepam (Valium)  Oxycodone  **Note: The testing scope of this panel does not include following  reported medications:  Aspirin  Cetirizine (Zyrtec)  Cyclobenzaprine (Flexeril)  Diltiazem (Cardizem)  Ezetimibe (Zetia)  Gabapentin (Neurontin)  Insulin (Humulin)  Isosorbide (Imdur)  Magnesium  Meclizine (Antivert)  Metformin (Glucophage)  Metoprolol (Toprol)  Montelukast (Singulair)  Naloxone (Narcan)   Niacin (Niaspan)  Nitroglycerin (Nitrostat)  Omega-3 Fatty Acids (Fish Oil)  Omeprazole (Nexium)  Prasugrel (Effient)  Rosuvastatin (Crestor) ==================================================================== For clinical consultation, please call [phone]. ====================================================================      ROS  Constitutional: Denies any fever or chills Gastrointestinal: No reported hemesis, hematochezia, vomiting, or acute GI distress Musculoskeletal: Denies any acute onset joint swelling, redness, loss of ROM, or weakness Neurological: No reported episodes of acute onset apraxia, aphasia, dysarthria, agnosia, amnesia, paralysis, loss of coordination, or loss of consciousness  Medication Review  ALPRAZolam, Fish Oil, Glucagon, Magnesium, Magnesium Oxide, Oxycodone HCl, cetirizine, cyclobenzaprine, diazepam, diltiazem, enalapril, esomeprazole, ezetimibe, gabapentin, insulin regular human CONCENTRATED, isosorbide mononitrate, meclizine, metFORMIN, metoprolol succinate, montelukast, niacin, prasugrel, and sodium chloride  History Review  Allergy: Kenneth Osborn has No Known Allergies. Drug: Kenneth Osborn  reports no history of drug use. Alcohol:  reports no history of alcohol use. Tobacco:  reports that he has never smoked. He has never used smokeless tobacco. Social: Kenneth Osborn  reports that he has never smoked. He has never used smokeless tobacco. He reports that he does not drink alcohol and does not use drugs. Medical:  has a past medical history of Acute postoperative pain (12/03/2018), Allergic rhinitis (12/30/2012), Anginal pain ([place]), Bronchitis, Can't get food down (08/12/2014), Chronic back pain, Concussion (09/2015), COPD (chronic obstructive pulmonary disease) ([place]), Coronary artery disease, DDD (degenerative disc disease), cervical, Dehydration symptoms, Diabetes mellitus without complication ([place]), Dysphagia, GERD (gastroesophageal reflux disease), History  of Meniere's disease (12/21/2014), History of thoracic spine surgery (S/P T9-10 IVD spacer) (12/21/2014), Hypercholesteremia, Hyperlipidemia, Hypertension, Meniere's disease, Myocardial infarction ([place]), Neuromuscular disorder ([place]), Psychosis ([place]), Short-segment Barrett's esophagus, and Sleep apnea. Surgical: Kenneth Osborn  has a past surgical history that includes Cardiovascular stress test; Cardiac catheterization; Coronary angioplasty; Labrinthectomy; mastoid shunt; Appendectomy; Back surgery; Shoulder arthroscopy with subacromial decompression (Left, 04/06/2012); ARTHRODESIS ANTERIOR ANTERIOR CERVICLE SPINE (01/04/2013); Colonoscopy with propofol (N/A, 09/19/2014); Esophagogastroduodenoscopy (N/A, 09/19/2014); Savory dilation (N/A, 09/19/2014); Spinal cord stimulator implant (Right); Cardiac catheterization (N/A, 06/15/2015); Cardiac catheterization (N/A, 06/15/2015); Pulse generator implant (N/A, 01/18/2019); Lumbar spinal cord simulator lead removal (Right, 08/09/2019); and IR Perc Cholecystostomy (02/09/2020). Family: family history includes  Cancer in his sister; Diabetes in his maternal grandmother, mother, and paternal grandmother; Heart disease in his father, maternal aunt, maternal uncle, and mother.  Laboratory Chemistry Profile   Renal Lab Results  Component Value Date   BUN 18 02/18/2020   CREATININE 0.81 02/18/2020   GFRAA 58 (L) 01/08/2019   GFRNONAA >60 02/18/2020     Hepatic Lab Results  Component Value Date   AST 77 (H) 02/17/2020   ALT 345 (H) 02/17/2020   ALBUMIN 2.5 (L) 02/17/2020   ALKPHOS 264 (H) 02/17/2020   HCVAB NON REACTIVE 02/08/2020   LIPASE 22 02/07/2020   AMMONIA 24 02/07/2020     Electrolytes Lab Results  Component Value Date   NA 139 02/18/2020   K 3.8 02/18/2020   CL 107 02/18/2020   CALCIUM 9.5 02/18/2020   MG 1.7 02/16/2020   PHOS 2.6 02/14/2020     Bone No results found for: VD25OH, VD125OH2TOT, GB2010OF1, QR9758IT2, 25OHVITD1, 25OHVITD2, 25OHVITD3,  TESTOFREE, TESTOSTERONE   Inflammation (CRP: Acute Phase) (ESR: Chronic Phase) Lab Results  Component Value Date   CRP 1.4 (H) 03/22/2015   ESRSEDRATE 9 03/22/2015   LATICACIDVEN 1.8 02/07/2020       Note: Above Lab results reviewed.  Recent Imaging Review  DG Chest Port 1 View CLINICAL DATA:  Ventilator dependence.  EXAM: PORTABLE CHEST 1 VIEW  COMPARISON:  02/09/2020  FINDINGS: 0416 hours. Endotracheal tube tip is 4.3 cm above the base of the carina. The NG tube passes into the stomach although the distal tip position is not included on the film. Esophageal temperature probe evident. Low lung volumes with basilar atelectasis/infiltrate, minimally progressed in the interval. Telemetry leads overlie the chest.  IMPRESSION: 1. Interval progression of bibasilar atelectasis/infiltrate. 2. ETT tip is 4.3 cm above the base of the carina.  Electronically Signed   By: Misty Stanley M.D.   On: 02/13/2020 04:53 Note: Reviewed        Physical Exam  General appearance: Well nourished, well developed, and well hydrated. In no apparent acute distress Mental status: Alert, oriented x 3 (person, place, & time)       Respiratory: No evidence of acute respiratory distress Eyes: PERLA Vitals: BP 103/71 (BP Location: Right Arm, Patient Position: Sitting, Cuff Size: Large)   Pulse 87   Temp 98.1 F (36.7 C) (Temporal)   Resp 16   Ht _0  (1.753 m)   Wt 202 lb (91.6 kg)   SpO2 100%   BMI 29.83 kg/m  BMI: Estimated body mass index is 29.83 kg/m as calculated from the following:   Height as of this encounter: _1  (1.753 m).   Weight as of this encounter: 202 lb (91.6 kg). Ideal: Ideal body weight: 70.7 kg (155 lb 13.8 oz) Adjusted ideal body weight: 79.1 kg (174 lb 5.1 oz)  Assessment   Status Diagnosis  Controlled Controlled Controlled 1. Chronic pain syndrome   2. Failed back surgical syndrome   3. Chronic low back pain (Bilateral) (L>R) w/ sciatica (Left)   4.  Chronic lower extremity pain (Left)   5. 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 Pharmacologic therapy   7. Uncomplicated opioid dependence ([place])      Updated Problems: No problems updated.  Plan of Care  Problem-specific:  No problem-specific Assessment & Plan notes found for this encounter.  Mr. JERY HOLLERN has a current medication list which includes the following long-term medication(s): cetirizine, cyclobenzaprine, esomeprazole, ezetimibe, gabapentin, humulin r u-500 kwikpen, magnesium  oxide, metoprolol succinate, montelukast, niacin, [START ON 03/25/2020] oxycodone hcl, [START ON 04/24/2020] oxycodone hcl, and [START ON 05/24/2020] oxycodone hcl.  Pharmacotherapy (Medications Ordered): Meds ordered this encounter  Medications  . Oxycodone HCl 10 MG TABS    Sig: Take 1 tablet (10 mg total) by mouth every 6 (six) hours as needed. Must last 30 days    Dispense:  120 tablet    Refill:  0    Chronic Pain: STOP Act (Not applicable) Fill 1 day early if closed on refill date. Avoid benzodiazepines within 8 hours of opioids  . Oxycodone HCl 10 MG TABS    Sig: Take 1 tablet (10 mg total) by mouth every 6 (six) hours as needed. Must last 30 days    Dispense:  120 tablet    Refill:  0    Chronic Pain: STOP Act (Not applicable) Fill 1 day early if closed on refill date. Avoid benzodiazepines within 8 hours of opioids  . Oxycodone HCl 10 MG TABS    Sig: Take 1 tablet (10 mg total) by mouth every 6 (six) hours as needed. Must last 30 days    Dispense:  120 tablet    Refill:  0    Chronic Pain: STOP Act (Not applicable) Fill 1 day early if closed on refill date. Avoid benzodiazepines within 8 hours of opioids   Orders:  No orders of the defined types were placed in this encounter.  Follow-up plan:   Return in about 4 months (around 06/23/2020) for (F2F), (Med Mgmt).      Considering: NOTE: EFFIENT Anticoagulation (Stop: 7-10 days  Re-start: 6 hrs)    Palliative PRN  treatment(s): Palliative right PSIS MNB/TPI  Diagnostic/palliative right lumbar facet block #3  Diagnostic/palliative left lumbar facet block #2  Palliative left lumbar facet RFA #2 (last done 12/03/2018)  Palliative right lumbar facet RFA #3 (last done 12/02/2019)      Recent Visits Date Type Provider Dept  01/04/20 Procedure visit Milinda Pointer, MD [facility]  12/02/19 Procedure visit Milinda Pointer, MD [facility]  Showing recent visits within past 90 days and meeting all other requirements Today's Visits Date Type Provider Dept  02/24/20 Office Visit Milinda Pointer, MD [facility]  Showing today's visits and meeting all other requirements Future Appointments No visits were found meeting these conditions. Showing future appointments within next 90 days and meeting all other requirements  I discussed the assessment and treatment plan with the patient. The patient was provided an opportunity to ask questions and all were answered. The patient agreed with the plan and demonstrated an understanding of the instructions.  Patient advised to call back or seek an in-person evaluation if the symptoms or condition worsens.  Duration of encounter: 30 minutes.  Note by: Gaspar Cola, MD Date: 02/24/2020; Time: 12:11 PM

## 2020-03-07 ENCOUNTER — Encounter: Payer: Self-pay | Admitting: Physician Assistant

## 2020-03-07 ENCOUNTER — Telehealth: Payer: Self-pay

## 2020-03-07 ENCOUNTER — Other Ambulatory Visit: Payer: Self-pay

## 2020-03-07 ENCOUNTER — Ambulatory Visit (INDEPENDENT_AMBULATORY_CARE_PROVIDER_SITE_OTHER): Payer: Medicare Other | Admitting: Physician Assistant

## 2020-03-07 ENCOUNTER — Ambulatory Visit (HOSPITAL_BASED_OUTPATIENT_CLINIC_OR_DEPARTMENT_OTHER)
Admission: RE | Admit: 2020-03-07 | Discharge: 2020-03-07 | Disposition: A | Discharge status: Home / Self Care | Payer: Medicare Other | Source: Ambulatory Visit | Attending: Physician Assistant | Admitting: Physician Assistant

## 2020-03-07 VITALS — BP 117/77 | HR 111 | Temp 98.0°F | Ht 69.0 in | Wt 209.8 lb

## 2020-03-07 DIAGNOSIS — K810 Acute cholecystitis: Secondary | ICD-10-CM | POA: Insufficient documentation

## 2020-03-07 MED ORDER — IOHEXOL 300 MG/ML  SOLN
100.0000 mL | Freq: Once | INTRAMUSCULAR | Status: AC | PRN
  Administered 2020-03-07: 100 mL via INTRAVENOUS

## 2020-03-07 NOTE — Telephone Encounter (Signed)
Acute cholecystitis-scheduled surgery Laparoscopic Cholecystectomy 04/05/2020-Dr.Rodenberg-drain site pain started last week and progressively worsened-visible redness- no pus-but has a thick scab-he is dumping about a cup of fluid within 24 hour period. Denies fever,nausea or vomiting. Decreased appetite and watching what he eats-denies constipation or diarrhea-has completed full course of antibiotic. He states the pain from the drain is so sharp it hurts to breath. He is taking oxycodone every 6 hours.

## 2020-03-07 NOTE — Progress Notes (Signed)
[facility] SURGICAL CLINIC NOTE  03/07/2020  History of Present Illness: Kenneth Osborn is a 65 y.o. male known to our service following hospitalization in December for acute cholecystitis which required percutaneous drain placement and ICU stay for sepsis. He has done well as an outpatient up until this point. He was seen by urology on 01/05 and recommended evaluation at tertiary center for possible robotic assisted nephrectomy. He is also going to be seen at [place] for robotic cholecystectomy as well in order to have both these procedures done under the same anesthesia.  He presents to clinic this morning for concerns over his drain. He reports that in the last 24-48 hours he has noticed increasing discomfort at his drain site. This is an intermittent sharp pain which can be exacerbated with deep inspiration. No fever, chills, nausea, emesis. He continues to drain "about a cup" of bile daily. He has not tried anything for the pain. No other complaints this morning.    Past Medical History: Past Medical History:  Diagnosis Date  . Acute postoperative pain 12/03/2018  . Allergic rhinitis 12/30/2012  . Anginal pain ([place])   . Bronchitis    hx of  . Can't get food down 08/12/2014  . Chronic back pain    thoracic area  . Concussion 09/2015  . COPD (chronic obstructive pulmonary disease) ([place])   . Coronary artery disease    99% blockage  . DDD (degenerative disc disease), cervical   . Dehydration symptoms    2019  . 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 Dysphagia   . GERD (gastroesophageal reflux disease)   . History of Meniere's disease 12/21/2014  . History of thoracic spine surgery (S/P T9-10 IVD spacer) 12/21/2014  . Hypercholesteremia   . Hyperlipidemia   . Hypertension    sees Dr. Jenny Reichmann walker Jefm Bryant  . 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 Myocardial infarction [facility])    Sees Dr. Drema Dallas, [place] clinic  .  Neuromuscular disorder ([place])    diabetic neuropathy in feet  . Psychosis ([place])   . Short-segment Barrett's esophagus   . Sleep apnea    uses cpap and 2 L o2 at hs     Past Surgical History: Past Surgical History:  Procedure Laterality Date  . APPENDECTOMY    . ARTHRODESIS ANTERIOR ANTERIOR CERVICLE SPINE  01/04/2013  . BACK SURGERY     fusion thoracic area  . CARDIAC CATHETERIZATION     may 2012 and Nov 20, 2010  . CARDIAC CATHETERIZATION N/A 06/15/2015   Procedure: Left Heart Cath and Coronary Angiography;  Surgeon: Corey Skains, MD;  Location: [place] CV LAB;  Service: Cardiovascular;  Laterality: N/A;  . CARDIAC CATHETERIZATION N/A 06/15/2015   Procedure: Coronary Stent Intervention;  Surgeon: Isaias Cowman, MD;  Location: [place] CV LAB;  Service: Cardiovascular;  Laterality: N/A;  . CARDIOVASCULAR STRESS TEST     jan 2014  . COLONOSCOPY WITH PROPOFOL N/A 09/19/2014   Procedure: COLONOSCOPY WITH PROPOFOL;  Surgeon: Manya Silvas, MD;  Location: [facility] ENDOSCOPY;  Service: Endoscopy;  Laterality: N/A;  . CORONARY ANGIOPLASTY     stent placement  . ESOPHAGOGASTRODUODENOSCOPY N/A 09/19/2014   Procedure: ESOPHAGOGASTRODUODENOSCOPY (EGD);  Surgeon: Manya Silvas, MD;  Location: [facility] ENDOSCOPY;  Service: Endoscopy;  Laterality: N/A;  . IR PERC CHOLECYSTOSTOMY  02/09/2020  . [place] right ear  . LUMBAR SPINAL CORD SIMULATOR  LEAD REMOVAL Right 08/09/2019   Procedure: REMOVAL SPINAL CORD STIMULATOR PERCUTANEOUS LEADS, REMOVAL PULSE GENERATOR;  Surgeon: [name], MD;  Location: [facility] ORS;  Service: Neurosurgery;  Laterality: Right;  LOCAL WITH MAC  . mastoid shunt     left, 2002, 1997 right ear, 1980 right ear  . PULSE GENERATOR IMPLANT N/A 01/18/2019   Procedure: MEDTRONIC SPINAL CORD STIMULATOR BATTERY EXCHANGE;  Surgeon: [name], MD;  Location: [facility] ORS;  Service: Neurosurgery;  Laterality: N/A;  . SAVORY DILATION N/A 09/19/2014   Procedure:  SAVORY DILATION;  Surgeon: Manya Silvas, MD;  Location: [facility] ENDOSCOPY;  Service: Endoscopy;  Laterality: N/A;  . SHOULDER ARTHROSCOPY WITH SUBACROMIAL DECOMPRESSION Left 04/06/2012   Procedure: SHOULDER ARTHROSCOPY WITH SUBACROMIAL DECOMPRESSION;  Surgeon: Vickey Huger, MD;  Location: [place];  Service: Orthopedics;  Laterality: Left;  left shoulder arthroscopy, subacromial decompression and distal clavicle resection  . SPINAL CORD STIMULATOR IMPLANT Right     Home Medications: Prior to Admission medications   Medication Sig Start Date End Date Taking? Authorizing Provider  ALPRAZolam Duanne Moron) 0.5 MG tablet Take 0.5 mg by mouth at bedtime.    [provider]  cetirizine (ZYRTEC) 10 MG tablet Take 10 mg by mouth daily.     [provider]  cyclobenzaprine (FLEXERIL) 10 MG tablet Take 1 tablet (10 mg total) by mouth 3 (three) times daily as needed for muscle spasms. 12/21/19 03/20/20  Milinda Pointer, MD  diazepam (VALIUM) 5 MG tablet Take 5 mg by mouth 3 (three) times daily as needed (Meniere's disease).     [provider]  diltiazem (CARDIZEM CD) 240 MG 24 hr capsule Take 240 mg by mouth at bedtime.  03/30/18   [provider]  enalapril (VASOTEC) 10 MG tablet Take 10 mg by mouth 2 (two) times daily.  07/28/18   [provider]  esomeprazole (NEXIUM) 40 MG capsule Take 40 mg by mouth 2 (two) times daily.    [provider]  ezetimibe (ZETIA) 10 MG tablet Take 10 mg by mouth at bedtime.  03/26/17   [provider]  gabapentin (NEURONTIN) 800 MG tablet Take 2 tablets (1,600 mg total) by mouth 2 (two) times daily. 12/21/19 03/20/20  Milinda Pointer, MD  Glucagon (BAQSIMI ONE PACK NA) Place 1 Dose into the nose daily as needed (severely low blood sugar).    [provider]  HUMULIN R U-500 KWIKPEN 500 UNIT/ML kwikpen Inject 40 Units into the skin 3 (three) times daily with meals. Patient taking differently: Inject 70-80 Units into  the skin See admin instructions. Can adjust according to blood sugar Inject 80 units with breakfast, 80 units with lunch, 70 units with dinner 01/19/18   Max Sane, MD  isosorbide mononitrate (IMDUR) 30 MG 24 hr tablet Take 30 mg by mouth at bedtime.    [provider]  Magnesium 400 MG TABS Take 400 mg by mouth daily.     [provider]  Magnesium Oxide 500 MG TABS Take 1 tablet (500 mg total) by mouth daily. 12/21/19 03/20/20  Milinda Pointer, MD  meclizine (ANTIVERT) 25 MG tablet Take 25 mg by mouth 3 (three) times daily as needed for dizziness.  07/06/15   [provider]  metFORMIN (GLUCOPHAGE) 1000 MG tablet Take 1,000 mg by mouth 2 (two) times daily with a meal. 03/06/18   [provider]  metoprolol succinate (TOPROL-XL) 100 MG 24 hr tablet Take 100 mg by mouth 2 (two) times daily. Take with or immediately following  a meal.    [provider]  montelukast (SINGULAIR) 10 MG tablet Take 10 mg by mouth at bedtime.     [provider]  niacin (NIASPAN) 500 MG CR tablet Take 500 mg by mouth at bedtime.    [provider]  Omega-3 Fatty Acids (FISH OIL) 1200 MG CAPS Take 1,200 mg by mouth 2 (two) times daily.    [provider]  Oxycodone HCl 10 MG TABS Take 1 tablet (10 mg total) by mouth every 6 (six) hours as needed. Must last 30 days 03/25/20 04/24/20  Milinda Pointer, MD  Oxycodone HCl 10 MG TABS Take 1 tablet (10 mg total) by mouth every 6 (six) hours as needed. Must last 30 days 04/24/20 05/24/20  Milinda Pointer, MD  Oxycodone HCl 10 MG TABS Take 1 tablet (10 mg total) by mouth every 6 (six) hours as needed. Must last 30 days 05/24/20 06/23/20  Milinda Pointer, MD  prasugrel (EFFIENT) 10 MG TABS tablet Take 10 mg by mouth daily. 11/22/19   [provider]  sodium chloride 0.9 % injection Flush the drain daily with 5 ml 02/18/20   Antonieta Pert, MD    Allergies: No Known Allergies  Review of Systems: Review of  Systems  Constitutional: Negative for chills and fever.  HENT: Negative for congestion and sore throat.   Respiratory: Negative for cough and shortness of breath.   Cardiovascular: Negative for chest pain and palpitations.  Gastrointestinal: Positive for abdominal pain. Negative for blood in stool, constipation, diarrhea, nausea and vomiting.  All other systems reviewed and are negative.   Physical Exam There were no vitals taken for this visit.  Physical Exam Vitals and nursing note reviewed. Exam conducted with a chaperone present.  Constitutional:      General: He is not in acute distress.    Appearance: Normal appearance. He is obese. He is not ill-appearing.  HENT:     Head: Normocephalic and atraumatic.  Eyes:     General: No scleral icterus.    Conjunctiva/sclera: Conjunctivae normal.  Pulmonary:     Effort: Pulmonary effort is normal. No respiratory distress.  Abdominal:     General: The ostomy site is clean.     Palpations: Abdomen is soft.     Tenderness: There is abdominal tenderness in the right upper quadrant. There is no guarding or rebound.       Comments: Abdomen is soft, protuberant consistent with degree of obesity, he is tender to the cholecystostomy tube site in RUQ expectedly, non-distended, certainly without evidence of peritonitis.   Genitourinary:    Comments: Deferred Musculoskeletal:     Right lower leg: No edema.     Left lower leg: No edema.  Skin:    General: Skin is warm and dry.  Neurological:     General: No focal deficit present.     Mental Status: He is alert and oriented to person, place, and time.  Psychiatric:        Mood and Affect: Mood normal.        Behavior: Behavior normal.     Labs/Imaging: No new pertinent imaging studies    Assessment and Plan: This is a 65 y.o. male with history of severe cholecystitis requiring percutaneous cholecystostomy tube and brief ICU stay, who presents for follow regarding drain problems.     - As a precaution, I will obtain CT Abdomen/Pelvis to re-evaluate drain placement and ensure location as well as rule out any worsening or new intra-abdominal  process(es) that may explain his degree of discomfort.   - I do NOT think the drain, nor the site, is infected and no ABX warranted at this time.   - Encouraged Tylenol / Motrin as tolerated for pain control.   - We will follow up based on CT results  - He will also follow up with [facility] in [place] for peri-operative planning given he will also need partial vs complete nephrectomy with urology there as well.   Face-to-face time spent with the patient and care providers was 30 minutes, with more than 50% of the time spent counseling, educating, and coordinating care of the patient.    -- Edison Simon, PA-C [facility] 03/07/2020, 11:04 AM [phone] M-F: 7am - 4pm

## 2020-03-07 NOTE — Patient Instructions (Addendum)
An order for a CT has been placed to check drain in gallbladder. Your CT is scheduled for 03/07/2020 @ [facility] @ 3:15pm with IV contrast. The address is [place]. We will call you with results. If you have any concerns or questions, please call our clinic.

## 2020-03-08 ENCOUNTER — Telehealth: Payer: Self-pay | Admitting: Surgery

## 2020-03-08 NOTE — Telephone Encounter (Signed)
Patient calls, had a CT scan done yesterday. He is able to see his results on his MyChart but needs more clarification.  If someone could reach out to him regarding his results.  Thank you.

## 2020-03-09 NOTE — Telephone Encounter (Signed)
Spoke with the patient's spouse and let her know that Dr Celine Ahr said that his scan looks good as far as the gallbladder and drain tube are concerned. Follow up as scheduled.

## 2020-03-10 ENCOUNTER — Other Ambulatory Visit: Payer: Self-pay

## 2020-03-10 ENCOUNTER — Ambulatory Visit (INDEPENDENT_AMBULATORY_CARE_PROVIDER_SITE_OTHER): Payer: Medicare Other | Admitting: Surgery

## 2020-03-10 ENCOUNTER — Encounter: Payer: Self-pay | Admitting: Surgery

## 2020-03-10 VITALS — Temp 98.8°F | Wt 210.0 lb

## 2020-03-10 DIAGNOSIS — T85518A Breakdown (mechanical) of other gastrointestinal prosthetic devices, implants and grafts, initial encounter: Secondary | ICD-10-CM

## 2020-03-10 MED ORDER — SODIUM CHLORIDE 0.9 % IJ SOLN: INTRAMUSCULAR | 0 refills | Status: DC

## 2020-03-10 MED ORDER — SODIUM CHLORIDE 0.9 % IJ SOLN: INTRAMUSCULAR | 2 refills | Status: DC

## 2020-03-10 NOTE — Patient Instructions (Signed)
Redness around the drain is normal and is just irritation. If you notice any spreading redness or yellow drainage from around the drain let us know.   Be sure to flush the drain at lease once a day.

## 2020-03-12 ENCOUNTER — Encounter: Payer: Self-pay | Admitting: Surgery

## 2020-03-12 NOTE — Progress Notes (Signed)
03/10/2020  History of Present Illness: Kenneth Osborn is a 65 y.o. male with a history of acute cholecystitis s/p percutaneous cholecystostomy tube placement on 02/09/20.  He presents today because he's been having more RUQ abdominal pain associated with nausea and vomiting.  He also reports that the drain output has significantly decreased over the past 48 hours.  Denies any fevers, chills, chest pain, shortness of breath.  He was last seen in our office on 03/07/20 for discomfort at the drain insertion site, and CT scan was obtained which showed the drain to be in good position without any complicating factors.  Past Medical History: Past Medical History:  Diagnosis Date  . Acute postoperative pain 12/03/2018  . Allergic rhinitis 12/30/2012  . Anginal pain ([place])   . Bronchitis    hx of  . Can't get food down 08/12/2014  . Chronic back pain    thoracic area  . Concussion 09/2015  . COPD (chronic obstructive pulmonary disease) ([place])   . Coronary artery disease    99% blockage  . DDD (degenerative disc disease), cervical   . Dehydration symptoms    2019  . 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 Dysphagia   . GERD (gastroesophageal reflux disease)   . History of Meniere's disease 12/21/2014  . History of thoracic spine surgery (S/P T9-10 IVD spacer) 12/21/2014  . Hypercholesteremia   . Hyperlipidemia   . Hypertension    sees Dr. Jenny Reichmann walker Jefm Bryant  . 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 Myocardial infarction [facility])    Sees Dr. Drema Dallas, [place] clinic  . Neuromuscular disorder ([place])    diabetic neuropathy in feet  . Psychosis ([place])   . Short-segment Barrett's esophagus   . Sleep apnea    uses cpap and 2 L o2 at hs     Past Surgical History: Past Surgical History:  Procedure Laterality Date  . APPENDECTOMY    . ARTHRODESIS ANTERIOR ANTERIOR CERVICLE SPINE  01/04/2013  . BACK SURGERY     fusion thoracic area  . CARDIAC  CATHETERIZATION     may 2012 and Nov 20, 2010  . CARDIAC CATHETERIZATION N/A 06/15/2015   Procedure: Left Heart Cath and Coronary Angiography;  Surgeon: Corey Skains, MD;  Location: [place] CV LAB;  Service: Cardiovascular;  Laterality: N/A;  . CARDIAC CATHETERIZATION N/A 06/15/2015   Procedure: Coronary Stent Intervention;  Surgeon: Isaias Cowman, MD;  Location: [place] CV LAB;  Service: Cardiovascular;  Laterality: N/A;  . CARDIOVASCULAR STRESS TEST     jan 2014  . COLONOSCOPY WITH PROPOFOL N/A 09/19/2014   Procedure: COLONOSCOPY WITH PROPOFOL;  Surgeon: Manya Silvas, MD;  Location: [facility] ENDOSCOPY;  Service: Endoscopy;  Laterality: N/A;  . CORONARY ANGIOPLASTY     stent placement  . ESOPHAGOGASTRODUODENOSCOPY N/A 09/19/2014   Procedure: ESOPHAGOGASTRODUODENOSCOPY (EGD);  Surgeon: Manya Silvas, MD;  Location: [facility] ENDOSCOPY;  Service: Endoscopy;  Laterality: N/A;  . IR PERC CHOLECYSTOSTOMY  02/09/2020  . [place] right ear  . LUMBAR SPINAL CORD SIMULATOR LEAD REMOVAL Right 08/09/2019   Procedure: REMOVAL SPINAL CORD STIMULATOR PERCUTANEOUS LEADS, REMOVAL PULSE GENERATOR;  Surgeon: [name], MD;  Location: [facility] ORS;  Service: Neurosurgery;  Laterality: Right;  LOCAL WITH MAC  . mastoid shunt     left, 2002, 1997 right ear, 1980 right ear  . PULSE GENERATOR IMPLANT N/A 01/18/2019   Procedure: [place]  STIMULATOR BATTERY EXCHANGE;  Surgeon: [name], MD;  Location: [facility] ORS;  Service: Neurosurgery;  Laterality: N/A;  . SAVORY DILATION N/A 09/19/2014   Procedure: SAVORY DILATION;  Surgeon: Manya Silvas, MD;  Location: [facility] ENDOSCOPY;  Service: Endoscopy;  Laterality: N/A;  . SHOULDER ARTHROSCOPY WITH SUBACROMIAL DECOMPRESSION Left 04/06/2012   Procedure: SHOULDER ARTHROSCOPY WITH SUBACROMIAL DECOMPRESSION;  Surgeon: Vickey Huger, MD;  Location: [place];  Service: Orthopedics;  Laterality: Left;  left shoulder arthroscopy, subacromial  decompression and distal clavicle resection  . SPINAL CORD STIMULATOR IMPLANT Right     Home Medications: Prior to Admission medications   Medication Sig Start Date End Date Taking? Authorizing Provider  ALPRAZolam Duanne Moron) 0.5 MG tablet Take 0.5 mg by mouth at bedtime.   Yes [provider]  cetirizine (ZYRTEC) 10 MG tablet Take 10 mg by mouth daily.    Yes [provider]  cyclobenzaprine (FLEXERIL) 10 MG tablet Take 1 tablet (10 mg total) by mouth 3 (three) times daily as needed for muscle spasms. 12/21/19 03/20/20 Yes Milinda Pointer, MD  diazepam (VALIUM) 5 MG tablet Take 5 mg by mouth 3 (three) times daily as needed (Meniere's disease).    Yes [provider]  diltiazem (CARDIZEM CD) 240 MG 24 hr capsule Take 240 mg by mouth at bedtime.  03/30/18  Yes [provider]  enalapril (VASOTEC) 10 MG tablet Take 10 mg by mouth 2 (two) times daily.  07/28/18  Yes [provider]  esomeprazole (NEXIUM) 40 MG capsule Take 40 mg by mouth 2 (two) times daily.   Yes [provider]  ezetimibe (ZETIA) 10 MG tablet Take 10 mg by mouth at bedtime.  03/26/17  Yes [provider]  gabapentin (NEURONTIN) 800 MG tablet Take 2 tablets (1,600 mg total) by mouth 2 (two) times daily. 12/21/19 03/20/20 Yes Milinda Pointer, MD  Glucagon (BAQSIMI ONE PACK NA) Place 1 Dose into the nose daily as needed (severely low blood sugar).   Yes [provider]  HUMULIN R U-500 KWIKPEN 500 UNIT/ML kwikpen Inject 40 Units into the skin 3 (three) times daily with meals. Patient taking differently: Inject 70-80 Units into the skin See admin instructions. Can adjust according to blood sugar Inject 80 units with breakfast, 80 units with lunch, 70 units with dinner 01/19/18  Yes Max Sane, MD  isosorbide mononitrate (IMDUR) 30 MG 24 hr tablet Take 30 mg by mouth at bedtime.   Yes [provider]  Magnesium 400 MG TABS Take 400 mg by mouth daily.    Yes  [provider]  Magnesium Oxide 500 MG TABS Take 1 tablet (500 mg total) by mouth daily. 12/21/19 03/20/20 Yes Milinda Pointer, MD  meclizine (ANTIVERT) 25 MG tablet Take 25 mg by mouth 3 (three) times daily as needed for dizziness.  07/06/15  Yes [provider]  metFORMIN (GLUCOPHAGE) 1000 MG tablet Take 1,000 mg by mouth 2 (two) times daily with a meal. 03/06/18  Yes [provider]  metoprolol succinate (TOPROL-XL) 100 MG 24 hr tablet Take 100 mg by mouth 2 (two) times daily. Take with or immediately following a meal.   Yes [provider]  montelukast (SINGULAIR) 10 MG tablet Take 10 mg by mouth at bedtime.    Yes [provider]  niacin (NIASPAN) 500 MG CR tablet Take 500 mg by mouth at bedtime.   Yes [provider]  Omega-3 Fatty Acids (FISH OIL) 1200 MG CAPS Take 1,200 mg by mouth 2 (two)  times daily.   Yes [provider]  Oxycodone HCl 10 MG TABS Take 1 tablet (10 mg total) by mouth every 6 (six) hours as needed. Must last 30 days 03/25/20 04/24/20 Yes Milinda Pointer, MD  Oxycodone HCl 10 MG TABS Take 1 tablet (10 mg total) by mouth every 6 (six) hours as needed. Must last 30 days 04/24/20 05/24/20 Yes Milinda Pointer, MD  Oxycodone HCl 10 MG TABS Take 1 tablet (10 mg total) by mouth every 6 (six) hours as needed. Must last 30 days 05/24/20 06/23/20 Yes Milinda Pointer, MD  prasugrel (EFFIENT) 10 MG TABS tablet Take 10 mg by mouth daily. 11/22/19  Yes [provider]  sodium chloride 0.9 % injection Flush the drain daily with 5 ml 03/10/20   Olean Ree, MD    Allergies: No Known Allergies  Review of Systems: Review of Systems  Constitutional: Negative for chills and fever.  Respiratory: Negative for shortness of breath.   Cardiovascular: Negative for chest pain.  Gastrointestinal: Positive for abdominal pain, nausea and vomiting.    Physical Exam Temp 98.8 F (37.1 C)   Wt 210 lb (95.3 kg)   SpO2 95%    BMI 31.01 kg/m  CONSTITUTIONAL: No acute distress HEENT:  Normocephalic, atraumatic, extraocular motion intact. RESPIRATORY:  Normal respiratory effort without pathologic use of accessory muscles. CARDIOVASCULAR: Regular rhythm and rate. GI: The abdomen is soft, non-distended.  When evaluating the drain, it appears that the drain has been kinked when trying to coil the tubing to secure everything in place.  Tubing straightened and coiled in better position.  There is some mild erythema at the drain insertion site, more consistent with irritation from having the tube itself. NEUROLOGIC:  Motor and sensation is grossly normal.  Cranial nerves are grossly intact. PSYCH:  Alert and oriented to person, place and time. Affect is normal.   Assessment and Plan: This is a 65 y.o. male with cholecystostomy drain malfunction.  --Discussed with the patient and his wife how to better position the drain so that the tubing does not get kinked, including making sure that everything is straight before trying to coil the drain, like a "telephone cord" as his wife mentioned.   --The drain is flushing well without issues.  No need to repeat any imaging studies at this point. --Follow up as scheduled.  Face-to-face time spent with the patient and care providers was 15 minutes, with more than 50% of the time spent counseling, educating, and coordinating care of the patient.     Melvyn Neth, [facility]

## 2020-03-14 ENCOUNTER — Ambulatory Visit
Admission: RE | Admit: 2020-03-14 | Discharge: 2020-03-14 | Disposition: A | Discharge status: Home / Self Care | Payer: Medicare Other | Source: Ambulatory Visit | Attending: Surgery | Admitting: Surgery

## 2020-03-14 ENCOUNTER — Ambulatory Visit
Admission: RE | Admit: 2020-03-14 | Discharge: 2020-03-14 | Disposition: A | Discharge status: Home / Self Care | Payer: Medicare Other | Source: Ambulatory Visit | Attending: Student | Admitting: Student

## 2020-03-14 ENCOUNTER — Encounter: Payer: Self-pay | Admitting: Radiology

## 2020-03-14 DIAGNOSIS — K810 Acute cholecystitis: Secondary | ICD-10-CM

## 2020-03-14 HISTORY — PX: IR RADIOLOGIST EVAL & MGMT: IMG5224

## 2020-03-14 NOTE — Progress Notes (Signed)
Referring Physician(s): Dr Hampton Abbot Dr Clyda Greener  Chief Complaint: The patient is seen in follow up today s/p Percutaneous cholecystostomy drain placed in IR 02/09/20  History of present illness:  Here today 6 weeks post drain placement Doing well Flushing daily. OP is bile and slowing some. Denies N/V  Denies fever/chills Minimal pain at site  Pt states he has seen Dr Hampton Abbot office recently He has also seen Dr Johney Maine with [facility] in [place]. Plan for Cholecystectomy 04/05/20 with Dr Johney Maine Pt has known Left renal mass--- it has been discussed with him that left nephrectomy can be performed at same time as Cholecystectomy-- But he and wife are not sure of plans just yet. They are to see Dr Johney Maine 04/03/20   Past Medical History:  Diagnosis Date  . Acute postoperative pain 12/03/2018  . Allergic rhinitis 12/30/2012  . Anginal pain ([place])   . Bronchitis    hx of  . Can't get food down 08/12/2014  . Chronic back pain    thoracic area  . Concussion 09/2015  . COPD (chronic obstructive pulmonary disease) ([place])   . Coronary artery disease    99% blockage  . DDD (degenerative disc disease), cervical   . Dehydration symptoms    2019  . 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 Dysphagia   . GERD (gastroesophageal reflux disease)   . History of Meniere's disease 12/21/2014  . History of thoracic spine surgery (S/P T9-10 IVD spacer) 12/21/2014  . Hypercholesteremia   . Hyperlipidemia   . Hypertension    sees Dr. Jenny Reichmann walker Jefm Bryant  . 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 Myocardial infarction [facility])    Sees Dr. Drema [place], [facility]  . Neuromuscular disorder ([place])    diabetic neuropathy in feet  . Psychosis ([place])   . Short-segment Barrett's esophagus   . Sleep apnea    uses cpap and 2 L o2 at hs    Past Surgical History:  Procedure Laterality Date  . APPENDECTOMY    . ARTHRODESIS ANTERIOR ANTERIOR CERVICLE SPINE   01/04/2013  . BACK SURGERY     fusion thoracic area  . CARDIAC CATHETERIZATION     may 2012 and Nov 20, 2010  . CARDIAC CATHETERIZATION N/A 06/15/2015   Procedure: Left Heart Cath and Coronary Angiography;  Surgeon: Corey Skains, MD;  Location: [place] CV LAB;  Service: Cardiovascular;  Laterality: N/A;  . CARDIAC CATHETERIZATION N/A 06/15/2015   Procedure: Coronary Stent Intervention;  Surgeon: Isaias Cowman, MD;  Location: [place] CV LAB;  Service: Cardiovascular;  Laterality: N/A;  . CARDIOVASCULAR STRESS TEST     jan 2014  . COLONOSCOPY WITH PROPOFOL N/A 09/19/2014   Procedure: COLONOSCOPY WITH PROPOFOL;  Surgeon: Manya Silvas, MD;  Location: [facility] ENDOSCOPY;  Service: Endoscopy;  Laterality: N/A;  . CORONARY ANGIOPLASTY     stent placement  . ESOPHAGOGASTRODUODENOSCOPY N/A 09/19/2014   Procedure: ESOPHAGOGASTRODUODENOSCOPY (EGD);  Surgeon: Manya Silvas, MD;  Location: [facility] END[facility]COPY;  Service: Endoscopy;  Laterality: N/A;  . IR PERC CHOLECYSTOSTOMY  02/09/2020  . [place] right ear  . LUMBAR SPINAL CORD SIMULATOR LEAD REMOVAL Right 08/09/2019   Procedure: REMOVAL SPINAL CORD STIMULATOR PERCUTANEOUS LEADS, REMOVAL PULSE GENERATOR;  Surgeon: [name], MD;  Location: [facility] ORS;  Service: Neurosurgery;  Laterality: Right;  LOCAL WITH MAC  . mastoid shunt     left, 2002, 1997 right ear,  1980 right ear  . PULSE GENERATOR IMPLANT N/A 01/18/2019   Procedure: MEDTRONIC SPINAL CORD STIMULATOR BATTERY EXCHANGE;  Surgeon: [name], MD;  Location: [facility] ORS;  Service: Neurosurgery;  Laterality: N/A;  . SAVORY DILATION N/A 09/19/2014   Procedure: SAVORY DILATION;  Surgeon: Manya Silvas, MD;  Location: [facility] ENDOSCOPY;  Service: Endoscopy;  Laterality: N/A;  . SHOULDER ARTHROSCOPY WITH SUBACROMIAL DECOMPRESSION Left 04/06/2012   Procedure: SHOULDER ARTHROSCOPY WITH SUBACROMIAL DECOMPRESSION;  Surgeon: Vickey Huger, MD;  Location: [place];  Service:  Orthopedics;  Laterality: Left;  left shoulder arthroscopy, subacromial decompression and distal clavicle resection  . SPINAL CORD STIMULATOR IMPLANT Right     Allergies: Patient has no known allergies.  Medications: Prior to Admission medications   Medication Sig Start Date End Date Taking? Authorizing Provider  ALPRAZolam Duanne Moron) 0.5 MG tablet Take 0.5 mg by mouth at bedtime.    [provider]  cetirizine (ZYRTEC) 10 MG tablet Take 10 mg by mouth daily.     [provider]  cyclobenzaprine (FLEXERIL) 10 MG tablet Take 1 tablet (10 mg total) by mouth 3 (three) times daily as needed for muscle spasms. 12/21/19 03/20/20  Milinda Pointer, MD  diazepam (VALIUM) 5 MG tablet Take 5 mg by mouth 3 (three) times daily as needed (Meniere's disease).     [provider]  diltiazem (CARDIZEM CD) 240 MG 24 hr capsule Take 240 mg by mouth at bedtime.  03/30/18   [provider]  enalapril (VASOTEC) 10 MG tablet Take 10 mg by mouth 2 (two) times daily.  07/28/18   [provider]  esomeprazole (NEXIUM) 40 MG capsule Take 40 mg by mouth 2 (two) times daily.    [provider]  ezetimibe (ZETIA) 10 MG tablet Take 10 mg by mouth at bedtime.  03/26/17   [provider]  gabapentin (NEURONTIN) 800 MG tablet Take 2 tablets (1,600 mg total) by mouth 2 (two) times daily. 12/21/19 03/20/20  Milinda Pointer, MD  Glucagon (BAQSIMI ONE PACK NA) Place 1 Dose into the nose daily as needed (severely low blood sugar).    [provider]  HUMULIN R U-500 KWIKPEN 500 UNIT/ML kwikpen Inject 40 Units into the skin 3 (three) times daily with meals. Patient taking differently: Inject 70-80 Units into the skin See admin instructions. Can adjust according to blood sugar Inject 80 units with breakfast, 80 units with lunch, 70 units with dinner 01/19/18   Max Sane, MD  isosorbide mononitrate (IMDUR) 30 MG 24 hr tablet Take 30 mg by mouth at bedtime.    [provider]  Magnesium 400 MG TABS Take 400 mg by mouth daily.     [provider]  Magnesium Oxide 500 MG TABS Take 1 tablet (500 mg total) by mouth daily. 12/21/19 03/20/20  Milinda Pointer, MD  meclizine (ANTIVERT) 25 MG tablet Take 25 mg by mouth 3 (three) times daily as needed for dizziness.  07/06/15   [provider]  metFORMIN (GLUCOPHAGE) 1000 MG tablet Take 1,000 mg by mouth 2 (two) times daily with a meal. 03/06/18   [provider]  metoprolol succinate (TOPROL-XL) 100 MG 24 hr tablet Take 100 mg by mouth 2 (two) times daily. Take with or immediately following a meal.    [provider]  montelukast (SINGULAIR) 10 MG tablet Take 10 mg by mouth at bedtime.     [provider]  niacin (NIASPAN) 500 MG CR tablet Take 500 mg by mouth at bedtime.  [provider]  Omega-3 Fatty Acids (FISH OIL) 1200 MG CAPS Take 1,200 mg by mouth 2 (two) times daily.    [provider]  Oxycodone HCl 10 MG TABS Take 1 tablet (10 mg total) by mouth every 6 (six) hours as needed. Must last 30 days 03/25/20 04/24/20  Milinda Pointer, MD  Oxycodone HCl 10 MG TABS Take 1 tablet (10 mg total) by mouth every 6 (six) hours as needed. Must last 30 days 04/24/20 05/24/20  Milinda Pointer, MD  Oxycodone HCl 10 MG TABS Take 1 tablet (10 mg total) by mouth every 6 (six) hours as needed. Must last 30 days 05/24/20 06/23/20  Milinda Pointer, MD  prasugrel (EFFIENT) 10 MG TABS tablet Take 10 mg by mouth daily. 11/22/19   [provider]  sodium chloride 0.9 % injection Flush the drain daily with 5 ml 03/10/20   Olean Ree, MD     Family History  Problem Relation Age of Onset  . Heart disease Mother   . Diabetes Mother   . Heart disease Father   . Cancer Sister   . Heart disease Maternal Aunt   . Heart disease Maternal Uncle   . Diabetes Maternal Grandmother   . Diabetes Paternal Grandmother     Social History   Socioeconomic History  .  Marital status: Married    Spouse name: Not on file  . Number of children: Not on file  . Years of education: Not on file  . Highest education level: Not on file  Occupational History  . Not on file  Tobacco Use  . Smoking status: Never Smoker  . Smokeless tobacco: Never Used  Vaping Use  . Vaping Use: Never used  Substance and Sexual Activity  . Alcohol use: No  . Drug use: No  . Sexual activity: Not Currently  Other Topics Concern  . Not on file  Social History Narrative   Patient hospitalized x 3 events for high K+ Pt had surgery on the 02/09/20   Social Determinants of Health   Financial Resource Strain: Not on file  Food Insecurity: Not on file  Transportation Needs: Not on file  Physical Activity: Not on file  Stress: Not on file  Social Connections: Not on file     Vital Signs: BP 134/66   Pulse 90   Temp 97.9 F (36.6 C)   SpO2 97%   Physical Exam Skin:    General: Skin is warm.     Comments: Site of drain is clean and dry NT No redness or sign of infection  Drain injection does reveal patent cystic duct; common bile duct and hepatic duct with flow into duodenum (per Dr Laurence Ferrari).     Imaging: No results found.  Labs:  CBC: Recent Labs    02/15/20 0627 02/16/20 0549 02/17/20 0406 02/18/20 0436  WBC 9.4 7.0 8.6 10.1  HGB 13.0 12.6* 12.8* 13.0  HCT 39.4 38.0* 38.6* 39.3  PLT 316 301 303 283    COAGS: Recent Labs    08/05/19 1138 02/08/20 0424  INR 0.9 1.4*  APTT 27  --     BMP: Recent Labs    02/15/20 0627 02/16/20 0549 02/17/20 0406 02/18/20 0436  NA 143 141 140 139  K 3.3* 3.4* 3.3* 3.8  CL 107 106 105 107  CO2 _0 GLUCOSE 237* 263* 241* 243*  BUN 27* 24* 20 18  CALCIUM 9.8 9.6 9.6 9.5  CREATININE 0.87 0.91 0.95 0.81  GFRNONAA >60 >60 >60 >60    LIVER FUNCTION TESTS: Recent Labs    02/11/20 0423 02/12/20 0529 02/15/20 0627 02/16/20 0549 02/17/20 0406  BILITOT 1.2  --  2.5* 1.3* 0.9  AST 42*  --   566* 395* 77*  ALT 222*  --  260* 574* 345*  ALKPHOS 177*  --  334* 328* 264*  PROT 6.1*  --  6.6 6.3* 6.2*  ALBUMIN 2.5* 2.3* 2.7* 2.5* 2.5*    Assessment:  Perc Cholecystostomy drain placed 02/09/20 Injection today shows all ducts patent and flow into duodenum. Capped today with bag to go- in case would develop symptoms of pain; fever; N/V----place drain back to bag if needed. Wife and pt aware and have good understanding of this plan. Pt is to keep appt with Dr Johney Maine 04/03/20 for planned Cholecystectomy 216/22 Pt will get full plan for left renal mass at that time.   Signed: Lavonia Drafts, PA-C 03/14/2020, 11:19 AM   Please refer to Dr. Laurence Ferrari attestation of this note for management and plan.

## 2020-03-17 ENCOUNTER — Telehealth: Payer: Self-pay

## 2020-03-17 NOTE — Telephone Encounter (Signed)
Medical clearance faxed  To Dr.Hedrick -also spoke with receptionist at his office to let them know we have requested medical clearance.

## 2020-03-21 ENCOUNTER — Ambulatory Visit: Payer: Self-pay | Admitting: Surgery

## 2020-03-21 ENCOUNTER — Telehealth: Payer: Self-pay

## 2020-03-21 NOTE — Telephone Encounter (Signed)
Medical Clearance received from [place]- procedure-medium risk assessment.

## 2020-03-22 ENCOUNTER — Other Ambulatory Visit: Payer: Self-pay | Admitting: Urology

## 2020-03-23 ENCOUNTER — Telehealth: Payer: Self-pay | Admitting: Surgery

## 2020-03-23 NOTE — Telephone Encounter (Signed)
Incoming call from wife, Tarry Fountain stating that they want to cancel surgery scheduled for 04/05/20.  She says that they do not wish to reschedule.  States that her husband will be having surgery done in [place].   All appointments are cancelled at their request.

## 2020-03-29 ENCOUNTER — Other Ambulatory Visit: Payer: Medicare Other

## 2020-04-03 ENCOUNTER — Other Ambulatory Visit: Payer: Medicare Other

## 2020-04-03 ENCOUNTER — Ambulatory Visit: Payer: Self-pay | Admitting: Surgery

## 2020-04-03 NOTE — H&P (Signed)
Kenneth Osborn Appointment: 04/03/2020 3:15 PM Location: [facility] Patient #: [id number] DOB: Jul 20, 1955 Married / Language: Kenneth Osborn / Race: White Male  History of Present Illness [name] MD; 04/03/2020 3:53 PM) The patient is a 65 year old male who presents for evaluation of acute cholecystitis. Note for "Acute cholecystitis": ` ` ` Patient sent for surgical consultation at the request of Dr [name]  Chief Complaint: Cholecystitis status post tube drainage. Need for surgery. ` ` The patient is a gentleman with coronary disease. History of angioplasty and stenting, COPD, GERD, hypertension, diabetes, kidney disease. Abdominal pain and fatigue. Came the [facility] emergency room concerned for Covid. Testing negative. Upper abdominal pain. Found to have cholecystitis. Did have some respiratory pulmonary failure issues. Hyperglycemic and shock. Not felt to be a good candidate for immediate surgery. Therefore a percutaneous cholecystostomy tube done. He gradually stabilized. Part of his workup was noted a renal mass. Workup highly suspicious for a left-sided renal cell cancer. Urology consulted. They recommended nephrectomy. Given the fact that this would mean to operations, which was made to coordinate surgery such that cholecystectomy and nephrectomy could done at the same time. That is scheduled in 2 weeks.  Patient comes in today for surgical follow-up. His care initially was over at [facility]. Now transitioning over to [facility]. PMH significant for coronary artery disease (PCI and stent placement of LAD with a drug-eluting stent on 06/2010, left circumflex 2015 in mid RCA 2017; restenosis of the LAD stent 12/08/2010 requiring balloon angioplasty), bilateral carotid artery stenosis, hypertension, diabetes, paroxysmal SVT, obstructive sleep apnea. Last seen by his cardiologist, Dr. [name], March 09, 2020 after his hospitalization. Stress  test showing ejection fraction of 60%. Normal Lexiscan infusion. Feels to be at the lowest possible risks for now. Anticoagulated on Effient  He is gradually gotten stronger since he left the hospital. He doesn't feel like he is back to normal yet, but he is better. His gallbladder tube has been capped. No major draining around it. He is back to his mild constipation when she takes stool softeners. He is on chronic narcotics for chronic back pain and other issues. Can walk about 15 minutes before he has to stop. Tends to walk with a cane for balance and other joint pain issues. He is here with his wife. Not on any oxygen for his COPD.      (Review of systems as stated in this history (HPI) or in the review of systems. Otherwise all other 12 point ROS are negative) ` ` ###########################################`  This patient encounter took 25 minutes today to perform the following: obtain history, perform exam, review outside records, interpret tests & imaging, counsel the patient on their diagnosis; and, document this encounter, including findings & plan in the electronic health record (EHR).   Past Surgical History Kenneth Osborn, [place]; 04/03/2020 3:21 PM) Appendectomy Shoulder Surgery Bilateral.  Diagnostic Studies History (Kenneth Osborn, CMA; 04/03/2020 3:21 PM) Colonoscopy 1-5 years ago  Allergies (Kenneth Osborn, CMA; 04/03/2020 3:21 PM) No Known Drug Allergies [04/03/2020]: No Known Allergies [04/03/2020]: Allergies Reconciled  Medication History (Kenneth Osborn, CMA; 04/03/2020 3:23 PM) oxyCODONE HCl (10MG Tablet, Oral) Active. Metoprolol Succinate ER (100MG Tablet ER 24HR, Oral) Active. metFORMIN HCl (1000MG Tablet, Oral) Active. Gabapentin (800MG Tablet, Oral) Active. Ezetimibe (10MG Tablet, Oral) Active. Esomeprazole Magnesium (40MG Capsule DR, Oral) Active. Enalapril Maleate (10MG Tablet, Oral)  Active. Rosuvastatin Calcium (40MG Tablet, Oral) Active. Nitroglycerin (0.4MG Tab Sublingual, Sublingual) Active. Montelukast Sodium (10MG Tablet, Oral)  Active. Medications Reconciled  Social History Kenneth Osborn, CMA; 04/03/2020 3:21 PM) No alcohol use No caffeine use No drug use Tobacco use Never smoker.  Family History Kenneth Osborn, CMA; 04/03/2020 3:21 PM) Diabetes Mellitus Mother. Heart disease in male family member before age 42 Heart disease in male family member before age 38  Other Problems Kenneth Osborn, CMA; 04/03/2020 3:21 PM) Back Pain Bladder Problems Cancer Chest pain Diabetes Mellitus Gastroesophageal Reflux Disease Hypercholesterolemia     Review of Systems (Kenneth Osborn CMA; 04/03/2020 3:21 PM) General Present- Weight Loss. Not Present- Appetite Loss, Chills, Fatigue, Fever, Night Sweats and Weight Gain. Skin Not Present- Change in Wart/Mole, Dryness, Hives, Jaundice, New Lesions, Non-Healing Wounds, Rash and Ulcer. HEENT Present- Hearing Loss, Ringing in the Ears and Wears glasses/contact lenses. Not Present- Earache, Hoarseness, Nose Bleed, Oral Ulcers, Seasonal Allergies, Sinus Pain, Sore Throat, Visual Disturbances and Yellow Eyes. Respiratory Present- Snoring. Not Present- Bloody sputum, Chronic Cough, Difficulty Breathing and Wheezing. Cardiovascular Present- Chest Pain. Not Present- Difficulty Breathing Lying Down, Leg Cramps, Palpitations, Rapid Heart Rate, Shortness of Breath and Swelling of Extremities. Gastrointestinal Not Present- Abdominal Pain, Bloating, Bloody Stool, Change in Bowel Habits, Chronic diarrhea, Constipation, Difficulty Swallowing, Excessive gas, Gets full quickly at meals, Hemorrhoids, Indigestion, Nausea, Rectal Pain and Vomiting. Male Genitourinary Present- Frequency. Not Present- Blood in Urine, Change in Urinary Stream, Impotence, Nocturia, Painful Urination, Urgency  and Urine Leakage. Musculoskeletal Present- Back Pain and Joint Stiffness. Not Present- Joint Pain, Muscle Pain, Muscle Weakness and Swelling of Extremities. Neurological Present- Numbness, Tingling and Trouble walking. Not Present- Decreased Memory, Fainting, Headaches, Seizures, Tremor and Weakness. Psychiatric Not Present- Anxiety, Bipolar, Change in Sleep Pattern, Depression, Fearful and Frequent crying. Endocrine Not Present- Cold Intolerance, Excessive Hunger, Hair Changes, Heat Intolerance, Hot flashes and New Diabetes. Hematology Present- Blood Thinners. Not Present- Easy Bruising, Excessive bleeding, Gland problems, HIV and Persistent Infections.  Vitals (Kenneth Osborn CMA; 04/03/2020 3:24 PM) 04/03/2020 3:23 PM Weight: 218.5 lb Height: 69in Body Surface Area: 2.14 m Body Mass Index: 32.27 kg/m  Temp.: 98.91F  Pulse: 114 (Regular)  P.OX: 95% (Room air) BP: 120/78(Sitting, Left Arm, Standard)        Physical Exam [name] MD; 04/03/2020 3:53 PM)  General Mental Status-Alert. General Appearance-Not in acute distress, Not Sickly. Orientation-Oriented X3. Hydration-Well hydrated. Voice-Normal.  Integumentary Global Assessment Upon inspection and palpation of skin surfaces of the - Axillae: non-tender, no inflammation or ulceration, no drainage. and Distribution of scalp and body hair is normal. General Characteristics Temperature - normal warmth is noted.  Head and Neck Head-normocephalic, atraumatic with no lesions or palpable masses. Face Global Assessment - atraumatic, no absence of expression. Neck Global Assessment - no abnormal movements, no bruit auscultated on the right, no bruit auscultated on the left, no decreased range of motion, non-tender. Trachea-midline. Thyroid Gland Characteristics - non-tender.  Eye Eyeball - Left-Extraocular movements intact, No Nystagmus - Left. Eyeball - Right-Extraocular  movements intact, No Nystagmus - Right. Cornea - Left-No Hazy - Left. Cornea - Right-No Hazy - Right. Sclera/Conjunctiva - Left-No scleral icterus, No Discharge - Left. Sclera/Conjunctiva - Right-No scleral icterus, No Discharge - Right. Pupil - Left-Direct reaction to light normal. Pupil - Right-Direct reaction to light normal.  ENMT Ears Pinna - Left - no drainage observed, no generalized tenderness observed. Pinna - Right - no drainage observed, no generalized tenderness observed. Nose and Sinuses External Inspection of the Nose - no destructive lesion observed. Inspection of the nares - Left -  quiet respiration. Inspection of the nares - Right - quiet respiration. Mouth and Throat Lips - Upper Lip - no fissures observed, no pallor noted. Lower Lip - no fissures observed, no pallor noted. Nasopharynx - no discharge present. Oral Cavity/Oropharynx - Tongue - no dryness observed. Oral Mucosa - no cyanosis observed. Hypopharynx - no evidence of airway distress observed.  Chest and Lung Exam Inspection Movements - Normal and Symmetrical. Accessory muscles - No use of accessory muscles in breathing. Palpation Palpation of the chest reveals - Non-tender. Auscultation Breath sounds - Normal and Clear.  Cardiovascular Auscultation Rhythm - Regular. Murmurs & Other Heart Sounds - Auscultation of the heart reveals - No Murmurs and No Systolic Clicks.  Abdomen Inspection Inspection of the abdomen reveals - No Visible peristalsis and No Abnormal pulsations. Umbilicus - No Bleeding, No Urine drainage. Palpation/Percussion Palpation and Percussion of the abdomen reveal - Soft, Non Tender, No Rebound tenderness, No Rigidity (guarding) and No Cutaneous hyperesthesia. Note: Abdomen soft. Nontender. Not distended. No umbilical or incisional hernias. No guarding.  Right upper quadrant percutaneous drain with site clear. No major cellulitis. No Murphy sign today.  Male  Genitourinary Sexual Maturity Tanner 5 - Adult hair pattern and Adult penile size and shape.  Peripheral Vascular Upper Extremity Inspection - Left - No Cyanotic nailbeds - Left, Not Ischemic. Inspection - Right - No Cyanotic nailbeds - Right, Not Ischemic.  Neurologic Neurologic evaluation reveals -normal attention span and ability to concentrate, able to name objects and repeat phrases. Appropriate fund of knowledge , normal sensation and normal coordination. Mental Status Affect - not angry, not paranoid. Cranial Nerves-Normal Bilaterally. Gait-Normal.  Neuropsychiatric Mental status exam performed with findings of-able to articulate well with normal speech/language, rate, volume and coherence, thought content normal with ability to perform basic computations and apply abstract reasoning and no evidence of hallucinations, delusions, obsessions or homicidal/suicidal ideation.  Musculoskeletal Global Assessment Spine, Ribs and Pelvis - no instability, subluxation or laxity. Right Upper Extremity - no instability, subluxation or laxity.  Lymphatic Head & Neck  General Head & Neck Lymphatics: Bilateral - Description - No Localized lymphadenopathy. Axillary  General Axillary Region: Bilateral - Description - No Localized lymphadenopathy. Femoral & Inguinal  Generalized Femoral & Inguinal Lymphatics: Left - Description - No Localized lymphadenopathy. Right - Description - No Localized lymphadenopathy.    Assessment & Plan [name] MD; 04/03/2020 3:54 PM)  ACUTE CHOLECYSTITIS DUE TO BILIARY CALCULUS (K80.00) Impression: Recovering status post emergent hospitalization percutaneous drainage for acute cholecystitis with empyema and septic shock.  Looks like cholecystitis is resolving. He's tolerated capping his biliary drain.  Plan for cholecystectomy in coordination with his robotic left nephrectomy. Date set for early March.  He is anticoagulated on Effient for  his coronary stents. Should hold it 5 days. Hardy cleared by his cardiologist.  Questions answered. I allowed him time to tell his story and and his frustrations. He is ready to focus on surgery in the next couple of weeks. Questions answered. He expressed understanding appreciation  Current Plans You are being scheduled for surgery- Our schedulers will call you.  You should hear from our office's scheduling department within 5 working days about the location, date, and time of surgery. We try to make accommodations for patient's preferences in scheduling surgery, but sometimes the OR schedule or the surgeon's schedule prevents [name] from making those accommodations.  If you have not heard from our office [phone]) in 5 working days, call the office and ask for  your surgeon's nurse.  If you have other questions about your diagnosis, plan, or surgery, call the office and ask for your surgeon's nurse.  Written instructions provided Pt Education - Pamphlet Given - Laparoscopic Gallbladder Surgery: discussed with patient and provided information. The anatomy & physiology of hepatobiliary & pancreatic function was discussed. The pathophysiology of gallbladder dysfunction was discussed. Natural history risks without surgery was discussed. I feel the risks of no intervention will lead to serious problems that outweigh the operative risks; therefore, I recommended cholecystectomy to remove the pathology. I explained laparoscopic techniques with possible need for an open approach. Probable cholangiogram to evaluate the bilary tract was explained as well.  Risks such as bleeding, infection, abscess, leak, injury to other organs, need for further treatment, heart attack, death, and other risks were discussed. I noted a good likelihood this will help address the problem. Possibility that this will not correct all abdominal symptoms was explained. Goals of post-operative recovery were discussed as  well. We will work to minimize complications. An educational handout further explaining the pathology and treatment options was given as well. Questions were answered. The patient expresses understanding & wishes to proceed with surgery.  Pt Education - [facility] Laparosopic Post Op HCI (Isiah Scheel) Pt Education - [facility] Good Bowel Health (Ericia Moxley)  ADMISSION FOR BILIARY DRAINAGE TUBE PLACEMENT (Z46.82) Impression: Drain site stable and without any biliary drainage capped. Leave in place until surgery.   LEFT RENAL MASS (N28.89) Impression: Large left renal mass. Discuss with Dr. Lajuan Lines. He plans nephrectomy. I think it would be wise for his part to go first since that is the more significant operation. Then I can finish with the cholecystectomy. Do robotically.   COPD (CHRONIC OBSTRUCTIVE PULMONARY DISEASE) (J44.9)   OSA (OBSTRUCTIVE SLEEP APNEA) (G47.33)   PRESENCE OF STENT IN CORONARY ARTERY IN PATIENT WITH CORONARY ARTERY DISEASE (I25.10)   ANTICOAGULATED (Z79.01) Impression: Anticoagulant on Effient. Cleared by cardiology. Hold Effient 5 days preop.  Current Plans Pt Education - [facility] Hold anticoagulation preoperatively  [name], MD, FACS, MASCRS Gastrointestinal and Minimally Invasive Surgery  [facility] [address] [place], [place] [address] [phone] Fax [phone] Main/Paging  CONTACT INFORMATION: Weekday (9AM-5PM) concerns: Call [facility] main office at [phone] Weeknight (5PM-9AM) or Weekend/Holiday concerns: Check [email or web address] for General Surgery [facility] coverage (Please, do not use SecureChat as it is not reliable communication to operating surgeons for immediate patient care)

## 2020-04-03 NOTE — H&P (View-Only) (Signed)
Kenneth Osborn Appointment: 04/03/2020 3:15 PM Location: [facility] Patient #: [id number] DOB: 02-Jan-1956 Married / Language: Kenneth Osborn / Race: White Male  History of Present Illness Kenneth Hector MD; 04/03/2020 3:53 PM) The patient is a 65 year old male who presents for evaluation of acute cholecystitis. Note for "Acute cholecystitis": ` ` ` Patient sent for surgical consultation at the request of Dr [name]  Chief Complaint: Cholecystitis status post tube drainage. Need for surgery. ` ` The patient is a gentleman with coronary disease. History of angioplasty and stenting, COPD, GERD, hypertension, diabetes, kidney disease. Abdominal pain and fatigue. Came the [facility] emergency room concerned for Covid. Testing negative. Upper abdominal pain. Found to have cholecystitis. Did have some respiratory pulmonary failure issues. Hyperglycemic and shock. Not felt to be a good candidate for immediate surgery. Therefore a percutaneous cholecystostomy tube done. He gradually stabilized. Part of his workup was noted a renal mass. Workup highly suspicious for a left-sided renal cell cancer. Urology consulted. They recommended nephrectomy. Given the fact that this would mean to operations, which was made to coordinate surgery such that cholecystectomy and nephrectomy could done at the same time. That is scheduled in 2 weeks.  Patient comes in today for surgical follow-up. His care initially was over at [facility]. Now transitioning over to [place]. PMH significant for coronary artery disease (PCI and stent placement of LAD with a drug-eluting stent on 06/2010, left circumflex 2015 in mid RCA 2017; restenosis of the LAD stent 12/08/2010 requiring balloon angioplasty), bilateral carotid artery stenosis, hypertension, diabetes, paroxysmal SVT, obstructive sleep apnea. Last seen by his cardiologist, Dr. [name], March 09, 2020 after his hospitalization. Stress  test showing ejection fraction of 60%. Normal Lexiscan infusion. Feels to be at the lowest possible risks for now. Anticoagulated on Effient  He is gradually gotten stronger since he left the hospital. He doesn't feel like he is back to normal yet, but he is better. His gallbladder tube has been capped. No major draining around it. He is back to his mild constipation when she takes stool softeners. He is on chronic narcotics for chronic back pain and other issues. Can walk about 15 minutes before he has to stop. Tends to walk with a cane for balance and other joint pain issues. He is here with his wife. Not on any oxygen for his COPD.      (Review of systems as stated in this history (HPI) or in the review of systems. Otherwise all other 12 point ROS are negative) ` ` ###########################################`  This patient encounter took 25 minutes today to perform the following: obtain history, perform exam, review outside records, interpret tests & imaging, counsel the patient on their diagnosis; and, document this encounter, including findings & plan in the electronic health record (EHR).   Past Surgical History Kenneth Osborn, [place]; 04/03/2020 3:21 PM) Appendectomy Shoulder Surgery Bilateral.  Diagnostic Studies History (Kenneth Osborn, CMA; 04/03/2020 3:21 PM) Colonoscopy 1-5 years ago  Allergies (Kenneth Osborn, CMA; 04/03/2020 3:21 PM) No Known Drug Allergies [04/03/2020]: No Known Allergies [04/03/2020]: Allergies Reconciled  Medication History (Kenneth Osborn, CMA; 04/03/2020 3:23 PM) oxyCODONE HCl (10MG Tablet, Oral) Active. Metoprolol Succinate ER (100MG Tablet ER 24HR, Oral) Active. metFORMIN HCl (1000MG Tablet, Oral) Active. Gabapentin (800MG Tablet, Oral) Active. Ezetimibe (10MG Tablet, Oral) Active. Esomeprazole Magnesium (40MG Capsule DR, Oral) Active. Enalapril Maleate (10MG Tablet, Oral)  Active. Rosuvastatin Calcium (40MG Tablet, Oral) Active. Nitroglycerin (0.4MG Tab Sublingual, Sublingual) Active. Montelukast Sodium (10MG Tablet, Oral)  Active. Medications Reconciled  Social History Kenneth Osborn, CMA; 04/03/2020 3:21 PM) No alcohol use No caffeine use No drug use Tobacco use Never smoker.  Family History Kenneth Osborn, CMA; 04/03/2020 3:21 PM) Diabetes Mellitus Mother. Heart disease in male family member before age 79 Heart disease in male family member before age 42  Other Problems Kenneth Osborn, CMA; 04/03/2020 3:21 PM) Back Pain Bladder Problems Cancer Chest pain Diabetes Mellitus Gastroesophageal Reflux Disease Hypercholesterolemia     Review of Systems (Kenneth Osborn CMA; 04/03/2020 3:21 PM) General Present- Weight Loss. Not Present- Appetite Loss, Chills, Fatigue, Fever, Night Sweats and Weight Gain. Skin Not Present- Change in Wart/Mole, Dryness, Hives, Jaundice, New Lesions, Non-Healing Wounds, Rash and Ulcer. HEENT Present- Hearing Loss, Ringing in the Ears and Wears glasses/contact lenses. Not Present- Earache, Hoarseness, Nose Bleed, Oral Ulcers, Seasonal Allergies, Sinus Pain, Sore Throat, Visual Disturbances and Yellow Eyes. Respiratory Present- Snoring. Not Present- Bloody sputum, Chronic Cough, Difficulty Breathing and Wheezing. Cardiovascular Present- Chest Pain. Not Present- Difficulty Breathing Lying Down, Leg Cramps, Palpitations, Rapid Heart Rate, Shortness of Breath and Swelling of Extremities. Gastrointestinal Not Present- Abdominal Pain, Bloating, Bloody Stool, Change in Bowel Habits, Chronic diarrhea, Constipation, Difficulty Swallowing, Excessive gas, Gets full quickly at meals, Hemorrhoids, Indigestion, Nausea, Rectal Pain and Vomiting. Male Genitourinary Present- Frequency. Not Present- Blood in Urine, Change in Urinary Stream, Impotence, Nocturia, Painful Urination, Urgency  and Urine Leakage. Musculoskeletal Present- Back Pain and Joint Stiffness. Not Present- Joint Pain, Muscle Pain, Muscle Weakness and Swelling of Extremities. Neurological Present- Numbness, Tingling and Trouble walking. Not Present- Decreased Memory, Fainting, Headaches, Seizures, Tremor and Weakness. Psychiatric Not Present- Anxiety, Bipolar, Change in Sleep Pattern, Depression, Fearful and Frequent crying. Endocrine Not Present- Cold Intolerance, Excessive Hunger, Hair Changes, Heat Intolerance, Hot flashes and New Diabetes. Hematology Present- Blood Thinners. Not Present- Easy Bruising, Excessive bleeding, Gland problems, HIV and Persistent Infections.  Vitals (Kenneth Osborn CMA; 04/03/2020 3:24 PM) 04/03/2020 3:23 PM Weight: 218.5 lb Height: 69in Body Surface Area: 2.14 m Body Mass Index: 32.27 kg/m  Temp.: 98.55F  Pulse: 114 (Regular)  P.OX: 95% (Room air) BP: 120/78(Sitting, Left Arm, Standard)        Physical Exam Kenneth Hector MD; 04/03/2020 3:53 PM)  General Mental Status-Alert. General Appearance-Not in acute distress, Not Sickly. Orientation-Oriented X3. Hydration-Well hydrated. Voice-Normal.  Integumentary Global Assessment Upon inspection and palpation of skin surfaces of the - Axillae: non-tender, no inflammation or ulceration, no drainage. and Distribution of scalp and body hair is normal. General Characteristics Temperature - normal warmth is noted.  Head and Neck Head-normocephalic, atraumatic with no lesions or palpable masses. Face Global Assessment - atraumatic, no absence of expression. Neck Global Assessment - no abnormal movements, no bruit auscultated on the right, no bruit auscultated on the left, no decreased range of motion, non-tender. Trachea-midline. Thyroid Gland Characteristics - non-tender.  Eye Eyeball - Left-Extraocular movements intact, No Nystagmus - Left. Eyeball - Right-Extraocular  movements intact, No Nystagmus - Right. Cornea - Left-No Hazy - Left. Cornea - Right-No Hazy - Right. Sclera/Conjunctiva - Left-No scleral icterus, No Discharge - Left. Sclera/Conjunctiva - Right-No scleral icterus, No Discharge - Right. Pupil - Left-Direct reaction to light normal. Pupil - Right-Direct reaction to light normal.  ENMT Ears Pinna - Left - no drainage observed, no generalized tenderness observed. Pinna - Right - no drainage observed, no generalized tenderness observed. Nose and Sinuses External Inspection of the Nose - no destructive lesion observed. Inspection of the nares - Left -  quiet respiration. Inspection of the nares - Right - quiet respiration. Mouth and Throat Lips - Upper Lip - no fissures observed, no pallor noted. Lower Lip - no fissures observed, no pallor noted. Nasopharynx - no discharge present. Oral Cavity/Oropharynx - Tongue - no dryness observed. Oral Mucosa - no cyanosis observed. Hypopharynx - no evidence of airway distress observed.  Chest and Lung Exam Inspection Movements - Normal and Symmetrical. Accessory muscles - No use of accessory muscles in breathing. Palpation Palpation of the chest reveals - Non-tender. Auscultation Breath sounds - Normal and Clear.  Cardiovascular Auscultation Rhythm - Regular. Murmurs & Other Heart Sounds - Auscultation of the heart reveals - No Murmurs and No Systolic Clicks.  Abdomen Inspection Inspection of the abdomen reveals - No Visible peristalsis and No Abnormal pulsations. Umbilicus - No Bleeding, No Urine drainage. Palpation/Percussion Palpation and Percussion of the abdomen reveal - Soft, Non Tender, No Rebound tenderness, No Rigidity (guarding) and No Cutaneous hyperesthesia. Note: Abdomen soft. Nontender. Not distended. No umbilical or incisional hernias. No guarding.  Right upper quadrant percutaneous drain with site clear. No major cellulitis. No Murphy sign today.  Male  Genitourinary Sexual Maturity Tanner 5 - Adult hair pattern and Adult penile size and shape.  Peripheral Vascular Upper Extremity Inspection - Left - No Cyanotic nailbeds - Left, Not Ischemic. Inspection - Right - No Cyanotic nailbeds - Right, Not Ischemic.  Neurologic Neurologic evaluation reveals -normal attention span and ability to concentrate, able to name objects and repeat phrases. Appropriate fund of knowledge , normal sensation and normal coordination. Mental Status Affect - not angry, not paranoid. Cranial Nerves-Normal Bilaterally. Gait-Normal.  Neuropsychiatric Mental status exam performed with findings of-able to articulate well with normal speech/language, rate, volume and coherence, thought content normal with ability to perform basic computations and apply abstract reasoning and no evidence of hallucinations, delusions, obsessions or homicidal/suicidal ideation.  Musculoskeletal Global Assessment Spine, Ribs and Pelvis - no instability, subluxation or laxity. Right Upper Extremity - no instability, subluxation or laxity.  Lymphatic Head & Neck  General Head & Neck Lymphatics: Bilateral - Description - No Localized lymphadenopathy. Axillary  General Axillary Region: Bilateral - Description - No Localized lymphadenopathy. Femoral & Inguinal  Generalized Femoral & Inguinal Lymphatics: Left - Description - No Localized lymphadenopathy. Right - Description - No Localized lymphadenopathy.    Assessment & Plan Kenneth Hector MD; 04/03/2020 3:54 PM)  ACUTE CHOLECYSTITIS DUE TO BILIARY CALCULUS (K80.00) Impression: Recovering status post emergent hospitalization percutaneous drainage for acute cholecystitis with empyema and septic shock.  Looks like cholecystitis is resolving. He's tolerated capping his biliary drain.  Plan for cholecystectomy in coordination with his robotic left nephrectomy. Date set for early March.  He is anticoagulated on Effient for  his coronary stents. Should hold it 5 days. Hardy cleared by his cardiologist.  Questions answered. I allowed him time to tell his story and and his frustrations. He is ready to focus on surgery in the next couple of weeks. Questions answered. He expressed understanding appreciation  Current Plans You are being scheduled for surgery- Our schedulers will call you.  You should hear from our office's scheduling department within 5 working days about the location, date, and time of surgery. We try to make accommodations for patient's preferences in scheduling surgery, but sometimes the OR schedule or the surgeon's schedule prevents [name] from making those accommodations.  If you have not heard from our office [phone]) in 5 working days, call the office and ask for  your surgeon's nurse.  If you have other questions about your diagnosis, plan, or surgery, call the office and ask for your surgeon's nurse.  Written instructions provided Pt Education - Pamphlet Given - Laparoscopic Gallbladder Surgery: discussed with patient and provided information. The anatomy & physiology of hepatobiliary & pancreatic function was discussed. The pathophysiology of gallbladder dysfunction was discussed. Natural history risks without surgery was discussed. I feel the risks of no intervention will lead to serious problems that outweigh the operative risks; therefore, I recommended cholecystectomy to remove the pathology. I explained laparoscopic techniques with possible need for an open approach. Probable cholangiogram to evaluate the bilary tract was explained as well.  Risks such as bleeding, infection, abscess, leak, injury to other organs, need for further treatment, heart attack, death, and other risks were discussed. I noted a good likelihood this will help address the problem. Possibility that this will not correct all abdominal symptoms was explained. Goals of post-operative recovery were discussed as  well. We will work to minimize complications. An educational handout further explaining the pathology and treatment options was given as well. Questions were answered. The patient expresses understanding & wishes to proceed with surgery.  Pt Education - [facility] Laparosopic Post Op HCI (Iria Jamerson) Pt Education - [facility] Good Bowel Health (Jemiah Cuadra)  ADMISSION FOR BILIARY DRAINAGE TUBE PLACEMENT (Z46.82) Impression: Drain site stable and without any biliary drainage capped. Leave in place until surgery.   LEFT RENAL MASS (N28.89) Impression: Large left renal mass. Discuss with Dr. Lajuan Lines. He plans nephrectomy. I think it would be wise for his part to go first since that is the more significant operation. Then I can finish with the cholecystectomy. Do robotically.   COPD (CHRONIC OBSTRUCTIVE PULMONARY DISEASE) (J44.9)   OSA (OBSTRUCTIVE SLEEP APNEA) (G47.33)   PRESENCE OF STENT IN CORONARY ARTERY IN PATIENT WITH CORONARY ARTERY DISEASE (I25.10)   ANTICOAGULATED (Z79.01) Impression: Anticoagulant on Effient. Cleared by cardiology. Hold Effient 5 days preop.  Current Plans Pt Education - [facility] Hold anticoagulation preoperatively  Kenneth Hector, MD, FACS, MASCRS Gastrointestinal and Minimally Invasive Surgery  [facility] [address], [place], [place] [address] [phone] Fax [phone] Main/Paging  CONTACT INFORMATION: Weekday (9AM-5PM) concerns: Call [facility] main office at [phone] Weeknight (5PM-9AM) or Weekend/Holiday concerns: Check [email or web address] for General Surgery [facility] coverage (Please, do not use SecureChat as it is not reliable communication to operating surgeons for immediate patient care)

## 2020-04-04 ENCOUNTER — Ambulatory Visit: Payer: Medicare Other | Admitting: Surgery

## 2020-04-05 ENCOUNTER — Ambulatory Visit: Admit: 2020-04-05 | Payer: Medicare Other | Admitting: Surgery

## 2020-04-05 SURGERY — CHOLECYSTECTOMY, ROBOT-ASSISTED, LAPAROSCOPIC
Anesthesia: General

## 2020-04-10 NOTE — Patient Instructions (Addendum)
DUE TO COVID-19 ONLY ONE VISITOR IS ALLOWED TO COME WITH YOU AND STAY IN THE WAITING ROOM ONLY DURING PRE OP AND PROCEDURE DAY OF SURGERY. THE 1 VISITOR  MAY VISIT WITH YOU AFTER SURGERY IN YOUR PRIVATE ROOM DURING VISITING HOURS ONLY!  YOU NEED TO HAVE A COVID 19 TEST ON_2/28______ @_11 :05______, THIS TEST MUST BE DONE BEFORE SURGERY,  COVID TESTING SITE [address] [place] [address], IT IS ON THE RIGHT GOING OUT [address] APPROXIMATELY  2 MINUTES PAST [employer] ON THE RIGHT. ONCE YOUR COVID TEST IS COMPLETED,  PLEASE BEGIN THE QUARANTINE INSTRUCTIONS AS OUTLINED IN YOUR HANDOUT.                RAYNOR CALCATERRA   Your procedure is scheduled on: 04/20/20   Report to [facility] Main  Entrance   Report to admitting at  9:45 AM     Call this number if you have problems the morning of surgery [place], NO CHEWING GUM [place].   No food after midnight.    You may have clear liquid until 8:30 AM  .  At 8:00  AM drink pre surgery drink.   Nothing by mouth after 8:30 AM.   Take these medicines the morning of surgery with A SIP OF WATER: Imdur, Metoprolol, Diltiazem, Nexium  How to Manage Your Diabetes Before and After Surgery  Why is it important to control my blood sugar before and after surgery? . Improving blood sugar levels before and after surgery helps healing and can limit problems. . A way of improving blood sugar control is eating a healthy diet by: o  Eating less sugar and carbohydrates o  Increasing activity/exercise o  Talking with your doctor about reaching your blood sugar goals . High blood sugars (greater than 180 mg/dL) can raise your risk of infections and slow your recovery, so you will need to focus on controlling your diabetes during the weeks before surgery. . Make sure that the doctor who takes care of your diabetes knows about your planned surgery  including the date and location.  How do I manage my blood sugar before surgery? . Check your blood sugar at least 4 times a day, starting 2 days before surgery, to make sure that the level is not too high or low. o Check your blood sugar the morning of your surgery when you wake up and every 2 hours until you get to the Short Stay unit. . If your blood sugar is less than 70 mg/dL, you will need to treat for low blood sugar: o Do not take insulin. o Treat a low blood sugar (less than 70 mg/dL) with  cup of clear juice (cranberry or apple), 4 glucose tablets, OR glucose gel. o Recheck blood sugar in 15 minutes after treatment (to make sure it is greater than 70 mg/dL). If your blood sugar is not greater than 70 mg/dL on recheck, call [phone] for further instructions. . Report your blood sugar to the short stay nurse when you get to Short Stay.  . If you are admitted to the hospital after surgery: o Your blood sugar will be checked by the staff and you will probably be given insulin after surgery (instead of oral diabetes medicines) to make sure you have good blood sugar levels. o The goal for blood sugar control after surgery is 80-180 mg/dL.  WHAT DO I DO ABOUT MY DIABETES MEDICATION?  Marland Kitchen Do not take oral diabetes medicines (pills) the morning of surgery.   . If your CBG is greater than 220 mg/dL, you may take  of your sliding scale  . (correction) dose of insulin.                                     You may not have any metal on your body including              piercings  Do not wear jewelry,  lotions, powders or deodorant .              Men may shave face and neck.   Do not bring valuables to the hospital. [place].  Contacts, dentures or bridgework may not be worn into surgery.     Name and phone number of your driver:  Special Instructions: N/A              Please read over the following fact sheets you were  given: _____________________________________________________________________             [facility] - Preparing for Surgery Before surgery, you can play an important role.  Because skin is not sterile, your skin needs to be as free of germs as possible.  You can reduce the number of germs on your skin by washing with CHG (chlorahexidine gluconate) soap before surgery.  CHG is an antiseptic cleaner which kills germs and bonds with the skin to continue killing germs even after washing. Please DO NOT use if you have an allergy to CHG or antibacterial soaps.  If your skin becomes reddened/irritated stop using the CHG and inform your nurse when you arrive at Short Stay. Do not shave (including legs and underarms) for at least 48 hours prior to the first CHG shower.  You may shave your face/neck. Please follow these instructions carefully:  1.  Shower with CHG Soap the night before surgery and the  morning of Surgery.  2.  If you choose to wash your hair, wash your hair first as usual with your  normal  shampoo.  3.  After you shampoo, rinse your hair and body thoroughly to remove the  shampoo.                                        4.  Use CHG as you would any other liquid soap.  You can apply chg directly  to the skin and wash                       Gently with a scrungie or clean washcloth.  5.  Apply the CHG Soap to your body ONLY FROM THE NECK DOWN.   Do not use on face/ open                           Wound or open sores. Avoid contact with eyes, ears mouth and genitals (private parts).                       Wash face,  Genitals (  private parts) with your normal soap.             6.  Wash thoroughly, paying special attention to the area where your surgery  will be performed.  7.  Thoroughly rinse your body with warm water from the neck down.  8.  DO NOT shower/wash with your normal soap after using and rinsing off  the CHG Soap.             9.  Pat yourself dry with a clean towel.            10.   Wear clean pajamas.            11.  Place clean sheets on your bed the night of your first shower and do not  sleep with pets. Day of Surgery : Do not apply any lotions/deodorants the morning of surgery.  Please wear clean clothes to the hospital/surgery center.  FAILURE TO FOLLOW THESE INSTRUCTIONS MAY RESULT IN THE CANCELLATION OF YOUR SURGERY PATIENT SIGNATURE_________________________________  NURSE SIGNATURE__________________________________  ________________________________________________________________________   Adam Phenix  An incentive spirometer is a tool that can help keep your lungs clear and active. This tool measures how well you are filling your lungs with each breath. Taking long deep breaths may help reverse or decrease the chance of developing breathing (pulmonary) problems (especially infection) following:  A long period of time when you are unable to move or be active. BEFORE THE PROCEDURE   If the spirometer includes an indicator to show your best effort, your nurse or respiratory therapist will set it to a desired goal.  If possible, sit up straight or lean slightly forward. Try not to slouch.  Hold the incentive spirometer in an upright position. INSTRUCTIONS FOR USE  1. Sit on the edge of your bed if possible, or sit up as far as you can in bed or on a chair. 2. Hold the incentive spirometer in an upright position. 3. Breathe out normally. 4. Place the mouthpiece in your mouth and seal your lips tightly around it. 5. Breathe in slowly and as deeply as possible, raising the piston or the ball toward the top of the column. 6. Hold your breath for 3-5 seconds or for as long as possible. Allow the piston or ball to fall to the bottom of the column. 7. Remove the mouthpiece from your mouth and breathe out normally. 8. Rest for a few seconds and repeat Steps 1 through 7 at least 10 times every 1-2 hours when you are awake. Take your time and take a few  normal breaths between deep breaths. 9. The spirometer may include an indicator to show your best effort. Use the indicator as a goal to work toward during each repetition. 10. After each set of 10 deep breaths, practice coughing to be sure your lungs are clear. If you have an incision (the cut made at the time of surgery), support your incision when coughing by placing a pillow or rolled up towels firmly against it. Once you are able to get out of bed, walk around indoors and cough well. You may stop using the incentive spirometer when instructed by your caregiver.  RISKS AND COMPLICATIONS  Take your time so you do not get dizzy or light-headed.  If you are in pain, you may need to take or ask for pain medication before doing incentive spirometry. It is harder to take a deep breath if you are having pain. AFTER USE  Rest and breathe  slowly and easily.  It can be helpful to keep track of a log of your progress. Your caregiver can provide you with a simple table to help with this. If you are using the spirometer at home, follow these instructions: [place] IF:   You are having difficultly using the spirometer.  You have trouble using the spirometer as often as instructed.  Your pain medication is not giving enough relief while using the spirometer.  You develop fever of 100.5 F (38.1 C) or higher. SEEK IMMEDIATE MEDICAL CARE IF:   You cough up bloody sputum that had not been present before.  You develop fever of 102 F (38.9 C) or greater.  You develop worsening pain at or near the incision site. MAKE SURE YOU:   Understand these instructions.  Will watch your condition.  Will get help right away if you are not doing well or get worse. Document Released: 06/17/2006 Document Revised: 04/29/2011 Document Reviewed: 08/18/2006 [facility] Patient Information 2014 [place], [place].   ________________________________________________________________________

## 2020-04-11 ENCOUNTER — Encounter (HOSPITAL_COMMUNITY): Payer: Self-pay

## 2020-04-11 ENCOUNTER — Other Ambulatory Visit: Payer: Self-pay

## 2020-04-11 ENCOUNTER — Encounter (HOSPITAL_COMMUNITY)
Admission: RE | Admit: 2020-04-11 | Discharge: 2020-04-11 | Disposition: A | Discharge status: Home / Self Care | Payer: Medicare Other | Source: Ambulatory Visit | Attending: Urology | Admitting: Urology

## 2020-04-11 DIAGNOSIS — E119 Type 2 diabetes mellitus without complications: Secondary | ICD-10-CM | POA: Diagnosis not present

## 2020-04-11 DIAGNOSIS — Z01818 Encounter for other preprocedural examination: Secondary | ICD-10-CM | POA: Insufficient documentation

## 2020-04-11 HISTORY — DX: Anxiety disorder, unspecified: F41.9

## 2020-04-11 LAB — BASIC METABOLIC PANEL
Anion gap: 9 (ref 5–15)
BUN: 13 mg/dL (ref 8–23)
CO2: 26 mmol/L (ref 22–32)
Calcium: 9.7 mg/dL (ref 8.9–10.3)
Chloride: 106 mmol/L (ref 98–111)
Creatinine, Ser: 0.86 mg/dL (ref 0.61–1.24)
GFR, Estimated: >60 mL/min (ref >60)
Glucose, Bld: 242 mg/dL — ABNORMAL HIGH (ref 70–99)
Potassium: 4.3 mmol/L (ref 3.5–5.1)
Sodium: 141 mmol/L (ref 135–145)

## 2020-04-11 LAB — CBC
HCT: 43.2 % (ref 39.0–52.0)
Hemoglobin: 14.4 g/dL (ref 13.0–17.0)
MCH: 28 pg (ref 26.0–34.0)
MCHC: 33.3 g/dL (ref 30.0–36.0)
MCV: 84 fL (ref 80.0–100.0)
Platelets: 281 10*3/uL (ref 150–400)
RBC: 5.14 MIL/uL (ref 4.22–5.81)
RDW: 14.6 % (ref 11.5–15.5)
WBC: 7.4 10*3/uL (ref 4.0–10.5)
nRBC: 0 % (ref 0.0–0.2)

## 2020-04-11 LAB — GLUCOSE, CAPILLARY: Glucose-Capillary: 237 mg/dL — ABNORMAL HIGH (ref 70–99)

## 2020-04-11 NOTE — Progress Notes (Signed)
COVID Vaccine Completed:Yes Date COVID Vaccine completed:05/26/19 COVID vaccine manufacturer:  [employer]   PCP - Dr. Irish Lack Cardiologist - Dr. Jacinto Reap. Kowalski  Chest x-ray - no EKG - chart, epic Stress Test - 2014 -epic ECHO - no Cardiac Cath - 2017 Pacemaker/ICD device last checked:NA  Sleep Study - yes CPAP -no - due to wt loss  Fasting Blood Sugar - 60-130 Checks Blood Sugar _____ times a day he has a Libre 14 day continuous monitoring on lt arm  Blood Thinner Instructions:Effient and ASA/ Dr. Kary Kos Aspirin Instructions:Stop Effient 7 days prior to DOS and continue the ASA/Dr. Alinda Money Last Dose:04/12/20  Anesthesia review:   Patient denies shortness of breath, fever, cough and chest pain at PAT appointment  yes Patient verbalized understanding of instructions that were given to them at the PAT appointment. Patient was also instructed that they will need to review over the PAT instructions again at home before surgery. Yes Pt can climb 1 flight of stairs, do house work and ADLs without SOB

## 2020-04-13 NOTE — Progress Notes (Signed)
Anesthesia Chart Review   Case: [id number] Date/Time: 04/20/20 1130   Procedures:      XI ROBOTIC ASSITED LAPAROSCOPIC PARTIAL NEPHRECTOMY (Left )     XI ROBOTIC ASSISTED LAPAROSCOPIC CHOLECYSTECTOMY WITH  INTRAOPERATIVE CHOLANGIOGRAM (N/A )     POSSIBLE NEEDLE CORE LIVER BIOPSY (N/A )   Anesthesia type: General   Pre-op diagnosis: LEFT RENAL NEOPLASM   Location: [facility] ROOM 03 / [facility]   Surgeons: Raynelle Bring, MD; Michael Boston, MD      DISCUSSION:64 y.o. never smoker with h/o HTN, DM II, GERD, CAD (DES LAD 06/2010, restenosis of LAD 11/2010 requiring balloon angioplasty), sleep apnea, left renal neoplasm scheduled for above procedure 04/20/20 with Dr. Raynelle Bring and Dr. Michael Boston.   Pt last seen by endocrinologist 03/15/2020. Per notes A1C from 12/21 8.2. At this visit insulin increased. CBG DOS.   Pt last seen by cardiology 03/09/2020. Per OV note, "Patient appears to be stable at this time. Patient does not exhibit any unstable or severe angina, no recent MI, no decompensated/worsening/new onset/class IV heart failure, no high-grade AV block, no symptomatic arrhythmias, no new ventricular tachycardia, no severe aortic stenosis or symptomatic mitral stenosis. This surgery appears to be low to moderate risk. The patient is optimized from a cardiovascular standpoint at this time. He appears to be at the lowest possible risk for cardiovascular events."  Anticipate pt can proceed with planned procedure barring acute status change.   VS: BP (!) 143/79   Pulse 93   Temp 36.7 C (Oral)   Resp 20   Ht _0  (1.753 m)   Wt 99.8 kg   SpO2 100%   BMI 32.49 kg/m   PROVIDERS: [name], MD is PCP   Mauricia Area, MD is Cardiologist   Honor Junes Justice Rocher, MD is Endocrinologist  LABS: Labs reviewed: Acceptable for surgery. (all labs ordered are listed, but only abnormal results are displayed)  Labs Reviewed  BASIC METABOLIC PANEL - Abnormal; Notable for the following  components:      Result Value   Glucose, Bld 242 (*)    All other components within normal limits  GLUCOSE, CAPILLARY - Abnormal; Notable for the following components:   Glucose-Capillary 237 (*)    All other components within normal limits  CBC     IMAGES:   EKG: 04/11/20 Rate 88 bpm  Normal sinus rhythm Poor R wave progression probably normal. Cannot rule out Anterior infarct , old No significant change since last tracing 02/07/2020 Confirmed by Adrian Prows ([id number]) on 04/11/2020 7:42:46  CV: Stress Test 07/05/2019 FINDINGS:  Regional wall motion: reveals normal myocardial thickening and wall  motion.  The overall quality of the study is good.   Artifacts noted: no  Left ventricular cavity: normal.   Echo 07/01/2019 INTERPRETATION  NORMAL LEFT VENTRICULAR SYSTOLIC FUNCTION  NORMAL RIGHT VENTRICULAR SYSTOLIC FUNCTION  MILD VALVULAR REGURGITATION (See above)  NO VALVULAR STENOSIS  TRIVIAL MR, PR  MILD TR  EF >55%   Past Medical History:  Diagnosis Date  . Acute postoperative pain 12/03/2018  . Allergic rhinitis 12/30/2012  . Anginal pain ([place])   . Anxiety   . Bronchitis    hx of  . Can't get food down 08/12/2014  . Cancer ([place]) 01/2020   kidney   . Chronic back pain    thoracic area  . Concussion 09/2015  . Coronary artery disease    99% blockage  . DDD (degenerative disc disease), cervical   . Dehydration symptoms  2019  . 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 Dysphagia   . GERD (gastroesophageal reflux disease)   . History of Meniere's disease 12/21/2014  . History of thoracic spine surgery (S/P T9-10 IVD spacer) 12/21/2014  . Hypercholesteremia   . Hyperlipidemia   . Hypertension    sees Dr. Jenny Reichmann walker Jefm Bryant  . 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 Myocardial infarction [facility]) 2012   Sees Dr. Drema Dallas, [place] [facility]  . Neuromuscular disorder ([place])    diabetic neuropathy in feet  .  Short-segment Barrett's esophagus   . Sleep apnea    mild    Past Surgical History:  Procedure Laterality Date  . APPENDECTOMY     age 36  . ARTHRODESIS ANTERIOR ANTERIOR CERVICLE SPINE  01/04/2013  . BACK SURGERY     fusion thoracic area  . CARDIAC CATHETERIZATION     may 2012 and Nov 20, 2010  . CARDIAC CATHETERIZATION N/A 06/15/2015   Procedure: Left Heart Cath and Coronary Angiography;  Surgeon: Corey Skains, MD;  Location: [facility];  Service: Cardiovascular;  Laterality: N/A;  . CARDIAC CATHETERIZATION N/A 06/15/2015   Procedure: Coronary Stent Intervention;  Surgeon: Isaias Cowman, MD;  Location: [facility];  Service: Cardiovascular;  Laterality: N/A;  . CARDIOVASCULAR STRESS TEST     jan 2014  . COLONOSCOPY WITH PROPOFOL N/A 09/19/2014   Procedure: COLONOSCOPY WITH PROPOFOL;  Surgeon: Manya Silvas, MD;  Location: [facility] ENDOSCOPY;  Service: Endoscopy;  Laterality: N/A;  . CORONARY ANGIOPLASTY     stent placement x3  . ESOPHAGOGASTRODUODENOSCOPY N/A 09/19/2014   Procedure: ESOPHAGOGASTRODUODENOSCOPY (EGD);  Surgeon: Manya Silvas, MD;  Location: [facility] ENDOSCOPY;  Service: Endoscopy;  Laterality: N/A;  . IR PERC CHOLECYSTOSTOMY  02/09/2020  . IR RADIOLOGIST EVAL & MGMT  03/14/2020  . [place] right ear  . LUMBAR SPINAL CORD SIMULATOR LEAD REMOVAL Right 08/09/2019   Procedure: REMOVAL SPINAL CORD STIMULATOR PERCUTANEOUS LEADS, REMOVAL PULSE GENERATOR;  Surgeon: [name], MD;  Location: [facility] ORS;  Service: Neurosurgery;  Laterality: Right;  LOCAL WITH MAC  . mastoid shunt Bilateral    left, 2002, 1997 right ear, 1980 right ear  . PULSE GENERATOR IMPLANT N/A 01/18/2019   Procedure: MEDTRONIC SPINAL CORD STIMULATOR BATTERY EXCHANGE;  Surgeon: [name], MD;  Location: [facility] ORS;  Service: Neurosurgery;  Laterality: N/A;  . SAVORY DILATION N/A 09/19/2014   Procedure: SAVORY DILATION;  Surgeon: Manya Silvas, MD;  Location: [facility]  ENDOSCOPY;  Service: Endoscopy;  Laterality: N/A;  . SHOULDER ARTHROSCOPY WITH SUBACROMIAL DECOMPRESSION Left 04/06/2012   Procedure: SHOULDER ARTHROSCOPY WITH SUBACROMIAL DECOMPRESSION;  Surgeon: Vickey Huger, MD;  Location: [place];  Service: Orthopedics;  Laterality: Left;  left shoulder arthroscopy, subacromial decompression and distal clavicle resection  . SPINAL CORD STIMULATOR IMPLANT Right     MEDICATIONS: . ALPRAZolam (XANAX) 0.5 MG tablet  . aspirin EC 81 MG tablet  . cetirizine (ZYRTEC) 10 MG tablet  . cyclobenzaprine (FLEXERIL) 10 MG tablet  . diazepam (VALIUM) 5 MG tablet  . diltiazem (CARDIZEM CD) 240 MG 24 hr capsule  . docusate sodium (COLACE) 100 MG capsule  . enalapril (VASOTEC) 10 MG tablet  . esomeprazole (NEXIUM) 40 MG capsule  . ezetimibe (ZETIA) 10 MG tablet  . gabapentin (NEURONTIN) 800 MG tablet  . Glucagon (BAQSIMI ONE PACK NA)  . HUMULIN R U-500 KWIKPEN 500 UNIT/ML kwikpen  .  hydrocortisone 2.5 % cream  . isosorbide mononitrate (IMDUR) 30 MG 24 hr tablet  . Magnesium Oxide 500 MG TABS  . meclizine (ANTIVERT) 25 MG tablet  . metFORMIN (GLUCOPHAGE) 1000 MG tablet  . metoprolol succinate (TOPROL-XL) 100 MG 24 hr tablet  . montelukast (SINGULAIR) 10 MG tablet  . niacin (NIASPAN) 500 MG CR tablet  . Omega-3 Fatty Acids (FISH OIL) 1200 MG CAPS  . Oxycodone HCl 10 MG TABS  . [START ON 04/24/2020] Oxycodone HCl 10 MG TABS  . [START ON 05/24/2020] Oxycodone HCl 10 MG TABS  . prasugrel (EFFIENT) 10 MG TABS tablet  . rosuvastatin (CRESTOR) 40 MG tablet  . sodium chloride 0.9 % injection   No current facility-administered medications for this encounter.    Konrad Felix, PA-C [facility] Pre-Surgical Testing [phone]

## 2020-04-13 NOTE — Anesthesia Preprocedure Evaluation (Addendum)
Anesthesia Evaluation  Patient identified by MRN, date of birth, ID band Patient awake    Reviewed: Allergy & Precautions, NPO status , Patient's Chart, lab work & pertinent test results  Airway Mallampati: II  TM Distance: >3 FB Neck ROM: Full    Dental  (+) Teeth Intact, Dental Advisory Given   Pulmonary sleep apnea ,    breath sounds clear to auscultation       Cardiovascular hypertension, + CAD, + Past MI and + Cardiac Stents   Rhythm:Regular Rate:Normal     Neuro/Psych  Headaches, PSYCHIATRIC DISORDERS Anxiety    GI/Hepatic GERD  ,  Endo/Other  diabetes  Renal/GU      Musculoskeletal  (+) Arthritis ,   Abdominal Normal abdominal exam  (+)   Peds  Hematology   Anesthesia Other Findings   Reproductive/Obstetrics                           Anesthesia Physical Anesthesia Plan  ASA: III  Anesthesia Plan: General   Post-op Pain Management:    Induction: Intravenous  PONV Risk Score and Plan: 3 and Ondansetron, Propofol infusion and Midazolam  Airway Management Planned: Oral ETT  Additional Equipment: Arterial line  Intra-op Plan:   Post-operative Plan: Extubation in OR  Informed Consent: I have reviewed the patients History and Physical, chart, labs and discussed the procedure including the risks, benefits and alternatives for the proposed anesthesia with the patient or authorized representative who has indicated his/her understanding and acceptance.     Dental advisory given  Plan Discussed with: CRNA  Anesthesia Plan Comments: (See PAT note 04/11/20, Konrad Felix, PA-C  2 IV's, arterial line  Stress Test 07/05/2019 FINDINGS:  Regional wall motion: reveals normal myocardial thickening and wall  motion.  The overall quality of the study is good.   Artifacts noted: no  Left ventricular cavity: normal. )     Anesthesia Quick Evaluation

## 2020-04-17 ENCOUNTER — Other Ambulatory Visit (HOSPITAL_COMMUNITY)
Admission: RE | Admit: 2020-04-17 | Discharge: 2020-04-17 | Disposition: A | Discharge status: Home / Self Care | Payer: Medicare Other | Source: Ambulatory Visit | Attending: Urology | Admitting: Urology

## 2020-04-17 DIAGNOSIS — Z01812 Encounter for preprocedural laboratory examination: Secondary | ICD-10-CM | POA: Insufficient documentation

## 2020-04-17 DIAGNOSIS — Z20822 Contact with and (suspected) exposure to covid-19: Secondary | ICD-10-CM | POA: Insufficient documentation

## 2020-04-17 LAB — SARS CORONAVIRUS 2 (TAT 6-24 HRS): SARS Coronavirus 2: NEGATIVE

## 2020-04-19 MED ORDER — BUPIVACAINE LIPOSOME 1.3 % IJ SUSP
20.0000 mL | Freq: Once | INTRAMUSCULAR | Status: DC
  Filled 2020-04-19: qty 20

## 2020-04-20 ENCOUNTER — Ambulatory Visit (HOSPITAL_COMMUNITY): Payer: Medicare Other | Admitting: Physician Assistant

## 2020-04-20 ENCOUNTER — Inpatient Hospital Stay (HOSPITAL_COMMUNITY)
Admission: RE | Admit: 2020-04-20 | Discharge: 2020-04-22 | DRG: 657 | Disposition: A | Discharge status: Home Health | Payer: Medicare Other | Source: Ambulatory Visit | Attending: Urology | Admitting: Urology

## 2020-04-20 ENCOUNTER — Ambulatory Visit (HOSPITAL_COMMUNITY): Payer: Medicare Other | Admitting: Certified Registered"

## 2020-04-20 ENCOUNTER — Encounter (HOSPITAL_COMMUNITY)
Admission: RE | Disposition: A | Discharge status: Home Health | Payer: Self-pay | Source: Ambulatory Visit | Attending: Urology

## 2020-04-20 ENCOUNTER — Encounter (HOSPITAL_COMMUNITY): Payer: Self-pay | Admitting: Urology

## 2020-04-20 DIAGNOSIS — I129 Hypertensive chronic kidney disease with stage 1 through stage 4 chronic kidney disease, or unspecified chronic kidney disease: Secondary | ICD-10-CM | POA: Diagnosis present

## 2020-04-20 DIAGNOSIS — Z955 Presence of coronary angioplasty implant and graft: Secondary | ICD-10-CM

## 2020-04-20 DIAGNOSIS — I2510 Atherosclerotic heart disease of native coronary artery without angina pectoris: Secondary | ICD-10-CM | POA: Diagnosis present

## 2020-04-20 DIAGNOSIS — I10 Essential (primary) hypertension: Secondary | ICD-10-CM | POA: Diagnosis present

## 2020-04-20 DIAGNOSIS — Z20822 Contact with and (suspected) exposure to covid-19: Secondary | ICD-10-CM | POA: Diagnosis present

## 2020-04-20 DIAGNOSIS — Z7984 Long term (current) use of oral hypoglycemic drugs: Secondary | ICD-10-CM

## 2020-04-20 DIAGNOSIS — K8010 Calculus of gallbladder with chronic cholecystitis without obstruction: Secondary | ICD-10-CM | POA: Diagnosis present

## 2020-04-20 DIAGNOSIS — Z79891 Long term (current) use of opiate analgesic: Secondary | ICD-10-CM

## 2020-04-20 DIAGNOSIS — K5900 Constipation, unspecified: Secondary | ICD-10-CM | POA: Diagnosis present

## 2020-04-20 DIAGNOSIS — E7800 Pure hypercholesterolemia, unspecified: Secondary | ICD-10-CM | POA: Diagnosis present

## 2020-04-20 DIAGNOSIS — M545 Low back pain, unspecified: Secondary | ICD-10-CM | POA: Diagnosis present

## 2020-04-20 DIAGNOSIS — E1122 Type 2 diabetes mellitus with diabetic chronic kidney disease: Secondary | ICD-10-CM | POA: Diagnosis present

## 2020-04-20 DIAGNOSIS — D49512 Neoplasm of unspecified behavior of left kidney: Secondary | ICD-10-CM | POA: Diagnosis present

## 2020-04-20 DIAGNOSIS — E1142 Type 2 diabetes mellitus with diabetic polyneuropathy: Secondary | ICD-10-CM | POA: Diagnosis present

## 2020-04-20 DIAGNOSIS — G8929 Other chronic pain: Secondary | ICD-10-CM | POA: Diagnosis present

## 2020-04-20 DIAGNOSIS — F32A Depression, unspecified: Secondary | ICD-10-CM | POA: Diagnosis present

## 2020-04-20 DIAGNOSIS — I252 Old myocardial infarction: Secondary | ICD-10-CM | POA: Diagnosis not present

## 2020-04-20 DIAGNOSIS — Z833 Family history of diabetes mellitus: Secondary | ICD-10-CM | POA: Diagnosis not present

## 2020-04-20 DIAGNOSIS — Z79899 Other long term (current) drug therapy: Secondary | ICD-10-CM | POA: Diagnosis not present

## 2020-04-20 DIAGNOSIS — Z794 Long term (current) use of insulin: Secondary | ICD-10-CM

## 2020-04-20 DIAGNOSIS — Z981 Arthrodesis status: Secondary | ICD-10-CM

## 2020-04-20 DIAGNOSIS — G4733 Obstructive sleep apnea (adult) (pediatric): Secondary | ICD-10-CM | POA: Diagnosis present

## 2020-04-20 DIAGNOSIS — N183 Chronic kidney disease, stage 3 unspecified: Secondary | ICD-10-CM | POA: Diagnosis present

## 2020-04-20 DIAGNOSIS — E119 Type 2 diabetes mellitus without complications: Secondary | ICD-10-CM

## 2020-04-20 DIAGNOSIS — N1832 Chronic kidney disease, stage 3b: Secondary | ICD-10-CM | POA: Diagnosis present

## 2020-04-20 DIAGNOSIS — Z8669 Personal history of other diseases of the nervous system and sense organs: Secondary | ICD-10-CM

## 2020-04-20 DIAGNOSIS — J449 Chronic obstructive pulmonary disease, unspecified: Secondary | ICD-10-CM | POA: Diagnosis present

## 2020-04-20 DIAGNOSIS — K219 Gastro-esophageal reflux disease without esophagitis: Secondary | ICD-10-CM | POA: Diagnosis present

## 2020-04-20 DIAGNOSIS — E1165 Type 2 diabetes mellitus with hyperglycemia: Secondary | ICD-10-CM | POA: Diagnosis present

## 2020-04-20 DIAGNOSIS — F419 Anxiety disorder, unspecified: Secondary | ICD-10-CM | POA: Diagnosis present

## 2020-04-20 DIAGNOSIS — K7581 Nonalcoholic steatohepatitis (NASH): Secondary | ICD-10-CM | POA: Diagnosis present

## 2020-04-20 DIAGNOSIS — E0842 Diabetes mellitus due to underlying condition with diabetic polyneuropathy: Secondary | ICD-10-CM | POA: Diagnosis present

## 2020-04-20 DIAGNOSIS — K812 Acute cholecystitis with chronic cholecystitis: Secondary | ICD-10-CM

## 2020-04-20 DIAGNOSIS — Z8249 Family history of ischemic heart disease and other diseases of the circulatory system: Secondary | ICD-10-CM | POA: Diagnosis not present

## 2020-04-20 DIAGNOSIS — Z7982 Long term (current) use of aspirin: Secondary | ICD-10-CM

## 2020-04-20 DIAGNOSIS — E1169 Type 2 diabetes mellitus with other specified complication: Secondary | ICD-10-CM | POA: Diagnosis present

## 2020-04-20 HISTORY — PX: ROBOT ASSISTED LAPAROSCOPIC NEPHRECTOMY: SHX5140

## 2020-04-20 LAB — POCT I-STAT 7, (LYTES, BLD GAS, ICA,H+H)
Acid-Base Excess: 2 mmol/L (ref 0.0–2.0)
Acid-base deficit: 1 mmol/L (ref 0.0–2.0)
Bicarbonate: 28.6 mmol/L — ABNORMAL HIGH (ref 20.0–28.0)
Bicarbonate: 24.1 mmol/L (ref 20.0–28.0)
Calcium, Ion: 1.3 mmol/L (ref 1.15–1.40)
Calcium, Ion: 1.28 mmol/L (ref 1.15–1.40)
HCT: 39 % (ref 39.0–52.0)
HCT: 39 % (ref 39.0–52.0)
Hemoglobin: 13.3 g/dL (ref 13.0–17.0)
Hemoglobin: 13.3 g/dL (ref 13.0–17.0)
O2 Saturation: 100 %
O2 Saturation: 99 %
Patient temperature: 35.8
Patient temperature: 36.4
Potassium: 3.9 mmol/L (ref 3.5–5.1)
Potassium: 3.9 mmol/L (ref 3.5–5.1)
Sodium: 141 mmol/L (ref 135–145)
Sodium: 141 mmol/L (ref 135–145)
TCO2: 30 mmol/L (ref 22–32)
TCO2: 25 mmol/L (ref 22–32)
pCO2 arterial: 48.3 mmHg — ABNORMAL HIGH (ref 32.0–48.0)
pCO2 arterial: 41.3 mmHg (ref 32.0–48.0)
pH, Arterial: 7.374 (ref 7.350–7.450)
pH, Arterial: 7.371 (ref 7.350–7.450)
pO2, Arterial: 331 mmHg — ABNORMAL HIGH (ref 83.0–108.0)
pO2, Arterial: 126 mmHg — ABNORMAL HIGH (ref 83.0–108.0)

## 2020-04-20 LAB — HEMOGLOBIN A1C
Hgb A1c MFr Bld: 7.2 % — ABNORMAL HIGH (ref 4.8–5.6)
Mean Plasma Glucose: 159.94 mg/dL

## 2020-04-20 LAB — BASIC METABOLIC PANEL
Anion gap: 13 (ref 5–15)
BUN: 12 mg/dL (ref 8–23)
CO2: 21 mmol/L — ABNORMAL LOW (ref 22–32)
Calcium: 9 mg/dL (ref 8.9–10.3)
Chloride: 104 mmol/L (ref 98–111)
Creatinine, Ser: 1.16 mg/dL (ref 0.61–1.24)
GFR, Estimated: >60 mL/min (ref >60)
Glucose, Bld: 309 mg/dL — ABNORMAL HIGH (ref 70–99)
Potassium: 4.1 mmol/L (ref 3.5–5.1)
Sodium: 138 mmol/L (ref 135–145)

## 2020-04-20 LAB — TYPE AND SCREEN
ABO/RH(D): O POS
Antibody Screen: NEGATIVE

## 2020-04-20 LAB — HEMOGLOBIN AND HEMATOCRIT, BLOOD
HCT: 40.6 % (ref 39.0–52.0)
Hemoglobin: 13.3 g/dL (ref 13.0–17.0)

## 2020-04-20 LAB — GLUCOSE, CAPILLARY
Glucose-Capillary: 216 mg/dL — ABNORMAL HIGH (ref 70–99)
Glucose-Capillary: 301 mg/dL — ABNORMAL HIGH (ref 70–99)
Glucose-Capillary: 276 mg/dL — ABNORMAL HIGH (ref 70–99)
Glucose-Capillary: 291 mg/dL — ABNORMAL HIGH (ref 70–99)
Glucose-Capillary: 270 mg/dL — ABNORMAL HIGH (ref 70–99)

## 2020-04-20 SURGERY — NEPHRECTOMY, RADICAL, ROBOT-ASSISTED, LAPAROSCOPIC, ADULT
Anesthesia: General

## 2020-04-20 MED ORDER — CEFAZOLIN SODIUM-DEXTROSE 2-3 GM-%(50ML) IV SOLR
INTRAVENOUS | Status: DC | PRN
Start: 2020-04-20 — End: 2020-04-20
  Administered 2020-04-20: 2 g via INTRAVENOUS

## 2020-04-20 MED ORDER — SODIUM CHLORIDE 0.9 % IV SOLN
8.0000 mg | Freq: Four times a day (QID) | INTRAVENOUS | Status: DC | PRN
  Filled 2020-04-20: qty 4

## 2020-04-20 MED ORDER — MEPERIDINE HCL 50 MG/ML IJ SOLN: 6.2500 mg | INTRAMUSCULAR | Status: DC | PRN

## 2020-04-20 MED ORDER — ACETAMINOPHEN 325 MG PO TABS: 325.0000 mg | ORAL_TABLET | Freq: Once | ORAL | Status: DC | PRN

## 2020-04-20 MED ORDER — DIAZEPAM 5 MG PO TABS
5.0000 mg | ORAL_TABLET | Freq: Every day | ORAL | Status: DC
  Administered 2020-04-21 – 2020-04-22 (×2): 5 mg via ORAL
  Filled 2020-04-20 (×2): qty 1

## 2020-04-20 MED ORDER — LIDOCAINE HCL (PF) 2 % IJ SOLN
INTRAMUSCULAR | Status: AC
  Filled 2020-04-20: qty 5

## 2020-04-20 MED ORDER — INSULIN ASPART 100 UNIT/ML ~~LOC~~ SOLN
SUBCUTANEOUS | Status: AC
  Filled 2020-04-20: qty 1

## 2020-04-20 MED ORDER — ENSURE SURGERY PO LIQD
237.0000 mL | Freq: Two times a day (BID) | ORAL | Status: DC
  Administered 2020-04-20: 237 mL via ORAL
  Filled 2020-04-20 (×2): qty 237

## 2020-04-20 MED ORDER — ACETAMINOPHEN 500 MG PO TABS
1000.0000 mg | ORAL_TABLET | ORAL | Status: AC
  Administered 2020-04-20: 1000 mg via ORAL
  Filled 2020-04-20: qty 2

## 2020-04-20 MED ORDER — PHENYLEPHRINE HCL-NACL 10-0.9 MG/250ML-% IV SOLN
INTRAVENOUS | Status: DC | PRN
  Administered 2020-04-20: 40 ug/min via INTRAVENOUS

## 2020-04-20 MED ORDER — CHLORHEXIDINE GLUCONATE CLOTH 2 % EX PADS: 6.0000 | MEDICATED_PAD | Freq: Once | CUTANEOUS | Status: DC

## 2020-04-20 MED ORDER — PROPOFOL 10 MG/ML IV BOLUS
INTRAVENOUS | Status: AC
  Filled 2020-04-20: qty 20

## 2020-04-20 MED ORDER — LABETALOL HCL 5 MG/ML IV SOLN
5.0000 mg | INTRAVENOUS | Status: DC | PRN
  Administered 2020-04-20: 5 mg via INTRAVENOUS

## 2020-04-20 MED ORDER — FENTANYL CITRATE (PF) 100 MCG/2ML IJ SOLN
INTRAMUSCULAR | Status: AC
  Filled 2020-04-20: qty 2

## 2020-04-20 MED ORDER — PROPOFOL 10 MG/ML IV BOLUS
INTRAVENOUS | Status: DC | PRN
  Administered 2020-04-20: 150 mg via INTRAVENOUS

## 2020-04-20 MED ORDER — LACTATED RINGERS IV SOLN
INTRAVENOUS | Status: AC | PRN
  Administered 2020-04-20 (×2): 1000 mL

## 2020-04-20 MED ORDER — ISOSORBIDE MONONITRATE ER 30 MG PO TB24
30.0000 mg | ORAL_TABLET | Freq: Every day | ORAL | Status: DC
Start: 2020-04-21 — End: 2020-04-22
  Administered 2020-04-21: 30 mg via ORAL
  Filled 2020-04-20: qty 1

## 2020-04-20 MED ORDER — HYDROMORPHONE HCL 1 MG/ML IJ SOLN
0.5000 mg | INTRAMUSCULAR | Status: DC | PRN
Start: 2020-04-20 — End: 2020-04-21
  Administered 2020-04-20 – 2020-04-21 (×4): 1 mg via INTRAVENOUS
  Filled 2020-04-20 (×5): qty 1

## 2020-04-20 MED ORDER — HYDRALAZINE HCL 20 MG/ML IJ SOLN: 5.0000 mg | INTRAMUSCULAR | Status: DC | PRN

## 2020-04-20 MED ORDER — ROCURONIUM BROMIDE 10 MG/ML (PF) SYRINGE
PREFILLED_SYRINGE | INTRAVENOUS | Status: DC | PRN
  Administered 2020-04-20: 20 mg via INTRAVENOUS
  Administered 2020-04-20: 10 mg via INTRAVENOUS
  Administered 2020-04-20 (×5): 20 mg via INTRAVENOUS
  Administered 2020-04-20: 60 mg via INTRAVENOUS
  Administered 2020-04-20: 20 mg via INTRAVENOUS

## 2020-04-20 MED ORDER — CLEVIDIPINE BUTYRATE 0.5 MG/ML IV EMUL
0.0000 mg/h | INTRAVENOUS | Status: DC
  Administered 2020-04-20: 1 mg/h via INTRAVENOUS
  Filled 2020-04-20: qty 50

## 2020-04-20 MED ORDER — SODIUM CHLORIDE (PF) 0.9 % IJ SOLN: 10.0000 [IU] | Freq: Once | INTRAMUSCULAR | Status: DC

## 2020-04-20 MED ORDER — LACTATED RINGERS IV SOLN: INTRAVENOUS | Status: DC | PRN

## 2020-04-20 MED ORDER — ACETAMINOPHEN 10 MG/ML IV SOLN
INTRAVENOUS | Status: AC
  Filled 2020-04-20: qty 100

## 2020-04-20 MED ORDER — BUPIVACAINE-EPINEPHRINE 0.25% -1:200000 IJ SOLN
INTRAMUSCULAR | Status: DC | PRN
  Administered 2020-04-20: 60 mL

## 2020-04-20 MED ORDER — INSULIN ASPART 100 UNIT/ML ~~LOC~~ SOLN
10.0000 [IU] | Freq: Once | SUBCUTANEOUS | Status: AC
  Administered 2020-04-20: 10 [IU] via SUBCUTANEOUS

## 2020-04-20 MED ORDER — ORAL CARE MOUTH RINSE
15.0000 mL | Freq: Once | OROMUCOSAL | Status: AC
  Administered 2020-04-20: 15 mL via OROMUCOSAL

## 2020-04-20 MED ORDER — METRONIDAZOLE IN NACL 5-0.79 MG/ML-% IV SOLN
INTRAVENOUS | Status: AC
  Filled 2020-04-20: qty 100

## 2020-04-20 MED ORDER — LIDOCAINE 2% (20 MG/ML) 5 ML SYRINGE
INTRAMUSCULAR | Status: DC | PRN
  Administered 2020-04-20: 40 mg via INTRAVENOUS

## 2020-04-20 MED ORDER — METRONIDAZOLE IN NACL 5-0.79 MG/ML-% IV SOLN
INTRAVENOUS | Status: DC | PRN
  Administered 2020-04-20: 500 mg via INTRAVENOUS

## 2020-04-20 MED ORDER — GABAPENTIN 400 MG PO CAPS
1600.0000 mg | ORAL_CAPSULE | Freq: Two times a day (BID) | ORAL | Status: DC
Start: 2020-04-20 — End: 2020-04-22
  Administered 2020-04-20 – 2020-04-22 (×4): 1600 mg via ORAL
  Filled 2020-04-20 (×4): qty 4

## 2020-04-20 MED ORDER — MAGNESIUM CITRATE PO SOLN: 0.5000 | Freq: Once | ORAL | Status: DC

## 2020-04-20 MED ORDER — LACTATED RINGERS IV SOLN: INTRAVENOUS | Status: DC

## 2020-04-20 MED ORDER — SUGAMMADEX SODIUM 200 MG/2ML IV SOLN
INTRAVENOUS | Status: DC | PRN
  Administered 2020-04-20: 200 mg via INTRAVENOUS

## 2020-04-20 MED ORDER — ENALAPRIL MALEATE 10 MG PO TABS
10.0000 mg | ORAL_TABLET | Freq: Two times a day (BID) | ORAL | Status: DC
  Administered 2020-04-20 – 2020-04-22 (×4): 10 mg via ORAL
  Filled 2020-04-20 (×5): qty 1

## 2020-04-20 MED ORDER — CEFAZOLIN SODIUM-DEXTROSE 2-4 GM/100ML-% IV SOLN
INTRAVENOUS | Status: AC
  Filled 2020-04-20: qty 100

## 2020-04-20 MED ORDER — DOCUSATE SODIUM 100 MG PO CAPS: 100.0000 mg | ORAL_CAPSULE | Freq: Every day | ORAL | Status: DC | PRN

## 2020-04-20 MED ORDER — CHLORHEXIDINE GLUCONATE 0.12 % MT SOLN: 15.0000 mL | Freq: Once | OROMUCOSAL | Status: AC

## 2020-04-20 MED ORDER — MIDAZOLAM HCL 2 MG/2ML IJ SOLN
INTRAMUSCULAR | Status: DC | PRN
  Administered 2020-04-20: 2 mg via INTRAVENOUS

## 2020-04-20 MED ORDER — MONTELUKAST SODIUM 10 MG PO TABS
10.0000 mg | ORAL_TABLET | Freq: Every day | ORAL | Status: DC
Start: 2020-04-20 — End: 2020-04-22
  Administered 2020-04-20 – 2020-04-21 (×2): 10 mg via ORAL
  Filled 2020-04-20 (×2): qty 1

## 2020-04-20 MED ORDER — CEFAZOLIN SODIUM-DEXTROSE 2-4 GM/100ML-% IV SOLN
2.0000 g | Freq: Once | INTRAVENOUS | Status: AC
  Administered 2020-04-20: 2 g via INTRAVENOUS
  Filled 2020-04-20: qty 100

## 2020-04-20 MED ORDER — LORATADINE 10 MG PO TABS
10.0000 mg | ORAL_TABLET | Freq: Every day | ORAL | Status: DC
  Administered 2020-04-20 – 2020-04-22 (×3): 10 mg via ORAL
  Filled 2020-04-20 (×2): qty 1

## 2020-04-20 MED ORDER — STERILE WATER FOR IRRIGATION IR SOLN
Status: DC | PRN
  Administered 2020-04-20: 1000 mL

## 2020-04-20 MED ORDER — INSULIN ASPART 100 UNIT/ML ~~LOC~~ SOLN
0.0000 [IU] | Freq: Every day | SUBCUTANEOUS | Status: DC
  Administered 2020-04-20: 3 [IU] via SUBCUTANEOUS

## 2020-04-20 MED ORDER — GABAPENTIN 300 MG PO CAPS
300.0000 mg | ORAL_CAPSULE | ORAL | Status: AC
  Administered 2020-04-20: 300 mg via ORAL
  Filled 2020-04-20: qty 1

## 2020-04-20 MED ORDER — ACETAMINOPHEN 10 MG/ML IV SOLN
1000.0000 mg | Freq: Once | INTRAVENOUS | Status: DC | PRN
  Administered 2020-04-20: 1000 mg via INTRAVENOUS

## 2020-04-20 MED ORDER — OXYCODONE HCL 5 MG PO TABS: 10.0000 mg | ORAL_TABLET | Freq: Four times a day (QID) | ORAL | Status: DC | PRN

## 2020-04-20 MED ORDER — CALCIUM POLYCARBOPHIL 625 MG PO TABS
625.0000 mg | ORAL_TABLET | Freq: Two times a day (BID) | ORAL | Status: DC
  Administered 2020-04-20 – 2020-04-22 (×4): 625 mg via ORAL
  Filled 2020-04-20 (×5): qty 1

## 2020-04-20 MED ORDER — LIDOCAINE HCL 2 % IJ SOLN
INTRAMUSCULAR | Status: AC
  Filled 2020-04-20: qty 20

## 2020-04-20 MED ORDER — DEXTROSE-NACL 5-0.45 % IV SOLN: INTRAVENOUS | Status: DC

## 2020-04-20 MED ORDER — DILTIAZEM HCL ER COATED BEADS 240 MG PO CP24
240.0000 mg | ORAL_CAPSULE | Freq: Every day | ORAL | Status: DC
  Administered 2020-04-21 – 2020-04-22 (×2): 240 mg via ORAL
  Filled 2020-04-20 (×2): qty 1

## 2020-04-20 MED ORDER — FENTANYL CITRATE (PF) 100 MCG/2ML IJ SOLN
INTRAMUSCULAR | Status: AC
  Administered 2020-04-20: 50 ug via INTRAVENOUS
  Filled 2020-04-20: qty 4

## 2020-04-20 MED ORDER — PANTOPRAZOLE SODIUM 40 MG PO TBEC
40.0000 mg | DELAYED_RELEASE_TABLET | Freq: Every day | ORAL | Status: DC
  Administered 2020-04-20 – 2020-04-22 (×3): 40 mg via ORAL
  Filled 2020-04-20 (×3): qty 1

## 2020-04-20 MED ORDER — ALBUMIN HUMAN 5 % IV SOLN
INTRAVENOUS | Status: AC
  Filled 2020-04-20: qty 250

## 2020-04-20 MED ORDER — ONDANSETRON HCL 4 MG/2ML IJ SOLN
INTRAMUSCULAR | Status: AC
  Filled 2020-04-20: qty 2

## 2020-04-20 MED ORDER — DIPHENHYDRAMINE HCL 12.5 MG/5ML PO ELIX: 12.5000 mg | ORAL_SOLUTION | Freq: Four times a day (QID) | ORAL | Status: DC | PRN

## 2020-04-20 MED ORDER — EZETIMIBE 10 MG PO TABS
10.0000 mg | ORAL_TABLET | Freq: Every day | ORAL | Status: DC
  Administered 2020-04-20 – 2020-04-22 (×3): 10 mg via ORAL
  Filled 2020-04-20 (×2): qty 1

## 2020-04-20 MED ORDER — ALBUMIN HUMAN 5 % IV SOLN
INTRAVENOUS | Status: DC | PRN
Start: 2020-04-20 — End: 2020-04-20

## 2020-04-20 MED ORDER — PHENYLEPHRINE HCL (PRESSORS) 10 MG/ML IV SOLN
INTRAVENOUS | Status: AC
  Filled 2020-04-20: qty 1

## 2020-04-20 MED ORDER — LABETALOL HCL 5 MG/ML IV SOLN
INTRAVENOUS | Status: AC
  Administered 2020-04-20: 5 mg via INTRAVENOUS
  Filled 2020-04-20: qty 4

## 2020-04-20 MED ORDER — MECLIZINE HCL 25 MG PO TABS: 25.0000 mg | ORAL_TABLET | Freq: Two times a day (BID) | ORAL | Status: DC | PRN

## 2020-04-20 MED ORDER — ALUM & MAG HYDROXIDE-SIMETH 200-200-20 MG/5ML PO SUSP: 30.0000 mL | Freq: Four times a day (QID) | ORAL | Status: DC | PRN

## 2020-04-20 MED ORDER — INSULIN ASPART 100 UNIT/ML ~~LOC~~ SOLN
SUBCUTANEOUS | Status: AC
  Administered 2020-04-20: 10 [IU] via INTRAVENOUS
  Filled 2020-04-20: qty 1

## 2020-04-20 MED ORDER — SODIUM CHLORIDE (PF) 0.9 % IJ SOLN
INTRAMUSCULAR | Status: AC
  Filled 2020-04-20: qty 20

## 2020-04-20 MED ORDER — METHOCARBAMOL 1000 MG/10ML IJ SOLN
1000.0000 mg | Freq: Four times a day (QID) | INTRAVENOUS | Status: DC | PRN
  Administered 2020-04-21: 1000 mg via INTRAVENOUS
  Filled 2020-04-20 (×2): qty 10

## 2020-04-20 MED ORDER — MECLIZINE HCL 25 MG PO TABS
25.0000 mg | ORAL_TABLET | Freq: Every day | ORAL | Status: DC
  Administered 2020-04-21 – 2020-04-22 (×2): 25 mg via ORAL
  Filled 2020-04-20 (×2): qty 1

## 2020-04-20 MED ORDER — DIPHENHYDRAMINE HCL 50 MG/ML IJ SOLN
12.5000 mg | Freq: Four times a day (QID) | INTRAMUSCULAR | Status: DC | PRN
Start: 2020-04-20 — End: 2020-04-22

## 2020-04-20 MED ORDER — AMISULPRIDE (ANTIEMETIC) 5 MG/2ML IV SOLN: 10.0000 mg | Freq: Once | INTRAVENOUS | Status: DC | PRN

## 2020-04-20 MED ORDER — DEXAMETHASONE SODIUM PHOSPHATE 10 MG/ML IJ SOLN
INTRAMUSCULAR | Status: DC | PRN
  Administered 2020-04-20: 4 mg via INTRAVENOUS

## 2020-04-20 MED ORDER — LACTATED RINGERS IR SOLN
Status: DC | PRN
  Administered 2020-04-20: 1000 mL

## 2020-04-20 MED ORDER — NIACIN ER (ANTIHYPERLIPIDEMIC) 500 MG PO TBCR
500.0000 mg | EXTENDED_RELEASE_TABLET | Freq: Every day | ORAL | Status: DC
  Administered 2020-04-20 – 2020-04-21 (×2): 500 mg via ORAL
  Filled 2020-04-20 (×3): qty 1

## 2020-04-20 MED ORDER — INSULIN ASPART 100 UNIT/ML ~~LOC~~ SOLN: 10.0000 [IU] | Freq: Once | SUBCUTANEOUS | Status: AC

## 2020-04-20 MED ORDER — INSULIN ASPART 100 UNIT/ML ~~LOC~~ SOLN: 0.0000 [IU] | Freq: Three times a day (TID) | SUBCUTANEOUS | Status: DC

## 2020-04-20 MED ORDER — DOCUSATE SODIUM 100 MG PO CAPS
100.0000 mg | ORAL_CAPSULE | Freq: Two times a day (BID) | ORAL | Status: DC
  Administered 2020-04-20 – 2020-04-22 (×4): 100 mg via ORAL
  Filled 2020-04-20 (×4): qty 1

## 2020-04-20 MED ORDER — BUPIVACAINE-EPINEPHRINE 0.25% -1:200000 IJ SOLN
INTRAMUSCULAR | Status: AC
  Filled 2020-04-20: qty 2

## 2020-04-20 MED ORDER — ONDANSETRON HCL 4 MG/2ML IJ SOLN
4.0000 mg | INTRAMUSCULAR | Status: DC | PRN
  Administered 2020-04-20: 4 mg via INTRAVENOUS
  Filled 2020-04-20: qty 2

## 2020-04-20 MED ORDER — DEXAMETHASONE SODIUM PHOSPHATE 10 MG/ML IJ SOLN
INTRAMUSCULAR | Status: AC
  Filled 2020-04-20: qty 1

## 2020-04-20 MED ORDER — DIAZEPAM 5 MG PO TABS
5.0000 mg | ORAL_TABLET | Freq: Two times a day (BID) | ORAL | Status: DC | PRN
  Administered 2020-04-21: 5 mg via ORAL
  Filled 2020-04-20: qty 1

## 2020-04-20 MED ORDER — CEFAZOLIN SODIUM-DEXTROSE 1-4 GM/50ML-% IV SOLN
1.0000 g | Freq: Three times a day (TID) | INTRAVENOUS | Status: AC
  Administered 2020-04-20 – 2020-04-21 (×2): 1 g via INTRAVENOUS
  Filled 2020-04-20 (×2): qty 50

## 2020-04-20 MED ORDER — CYCLOBENZAPRINE HCL 10 MG PO TABS
10.0000 mg | ORAL_TABLET | Freq: Three times a day (TID) | ORAL | Status: DC | PRN
  Administered 2020-04-20: 10 mg via ORAL
  Filled 2020-04-20: qty 1

## 2020-04-20 MED ORDER — LIP MEDEX EX OINT
1.0000 "application " | TOPICAL_OINTMENT | Freq: Two times a day (BID) | CUTANEOUS | Status: DC
  Administered 2020-04-20 – 2020-04-22 (×4): 1 via TOPICAL
  Filled 2020-04-20 (×2): qty 7

## 2020-04-20 MED ORDER — ENSURE PRE-SURGERY PO LIQD
296.0000 mL | Freq: Once | ORAL | Status: DC
  Filled 2020-04-20: qty 296

## 2020-04-20 MED ORDER — ACETAMINOPHEN 160 MG/5ML PO SOLN: 325.0000 mg | Freq: Once | ORAL | Status: DC | PRN

## 2020-04-20 MED ORDER — ROCURONIUM BROMIDE 10 MG/ML (PF) SYRINGE
PREFILLED_SYRINGE | INTRAVENOUS | Status: AC
  Filled 2020-04-20: qty 10

## 2020-04-20 MED ORDER — FENTANYL CITRATE (PF) 100 MCG/2ML IJ SOLN
25.0000 ug | INTRAMUSCULAR | Status: DC | PRN
  Administered 2020-04-20 (×2): 25 ug via INTRAVENOUS
  Administered 2020-04-20: 50 ug via INTRAVENOUS

## 2020-04-20 MED ORDER — PHENYLEPHRINE 40 MCG/ML (10ML) SYRINGE FOR IV PUSH (FOR BLOOD PRESSURE SUPPORT)
PREFILLED_SYRINGE | INTRAVENOUS | Status: DC | PRN
  Administered 2020-04-20 (×2): 40 ug via INTRAVENOUS

## 2020-04-20 MED ORDER — ACETAMINOPHEN 10 MG/ML IV SOLN
1000.0000 mg | Freq: Four times a day (QID) | INTRAVENOUS | Status: DC
  Administered 2020-04-20 – 2020-04-21 (×2): 1000 mg via INTRAVENOUS
  Filled 2020-04-20 (×2): qty 100

## 2020-04-20 MED ORDER — ONDANSETRON HCL 4 MG/2ML IJ SOLN
INTRAMUSCULAR | Status: DC | PRN
  Administered 2020-04-20: 4 mg via INTRAVENOUS

## 2020-04-20 MED ORDER — PHENYLEPHRINE 40 MCG/ML (10ML) SYRINGE FOR IV PUSH (FOR BLOOD PRESSURE SUPPORT)
PREFILLED_SYRINGE | INTRAVENOUS | Status: AC
  Filled 2020-04-20: qty 10

## 2020-04-20 MED ORDER — METHOCARBAMOL 500 MG PO TABS: 1000.0000 mg | ORAL_TABLET | Freq: Four times a day (QID) | ORAL | Status: DC | PRN

## 2020-04-20 MED ORDER — FENTANYL CITRATE (PF) 250 MCG/5ML IJ SOLN
INTRAMUSCULAR | Status: DC | PRN
  Administered 2020-04-20 (×2): 50 ug via INTRAVENOUS
  Administered 2020-04-20: 25 ug via INTRAVENOUS
  Administered 2020-04-20: 50 ug via INTRAVENOUS
  Administered 2020-04-20: 25 ug via INTRAVENOUS
  Administered 2020-04-20: 100 ug via INTRAVENOUS

## 2020-04-20 MED ORDER — ALPRAZOLAM 0.5 MG PO TABS
0.5000 mg | ORAL_TABLET | Freq: Every day | ORAL | Status: DC
Start: 2020-04-20 — End: 2020-04-22
  Administered 2020-04-20 – 2020-04-21 (×2): 0.5 mg via ORAL
  Filled 2020-04-20 (×2): qty 1

## 2020-04-20 MED ORDER — ROSUVASTATIN CALCIUM 20 MG PO TABS
40.0000 mg | ORAL_TABLET | Freq: Every day | ORAL | Status: DC
Start: 2020-04-20 — End: 2020-04-22
  Administered 2020-04-20 – 2020-04-21 (×2): 40 mg via ORAL
  Filled 2020-04-20 (×2): qty 2

## 2020-04-20 MED ORDER — LIDOCAINE HCL (PF) 2 % IJ SOLN
INTRAMUSCULAR | Status: DC | PRN
  Administered 2020-04-20: 1.5 mg/kg/h via INTRADERMAL

## 2020-04-20 MED ORDER — PROCHLORPERAZINE EDISYLATE 10 MG/2ML IJ SOLN
5.0000 mg | INTRAMUSCULAR | Status: DC | PRN
Start: 2020-04-20 — End: 2020-04-22

## 2020-04-20 MED ORDER — METOPROLOL SUCCINATE ER 100 MG PO TB24
100.0000 mg | ORAL_TABLET | Freq: Two times a day (BID) | ORAL | Status: DC
Start: 2020-04-20 — End: 2020-04-22
  Administered 2020-04-20 – 2020-04-22 (×4): 100 mg via ORAL
  Filled 2020-04-20 (×4): qty 1

## 2020-04-20 MED ORDER — MIDAZOLAM HCL 2 MG/2ML IJ SOLN
INTRAMUSCULAR | Status: AC
  Filled 2020-04-20: qty 2

## 2020-04-20 MED ORDER — BUPIVACAINE LIPOSOME 1.3 % IJ SUSP
INTRAMUSCULAR | Status: DC | PRN
  Administered 2020-04-20: 20 mL

## 2020-04-20 MED ORDER — KETAMINE HCL 10 MG/ML IJ SOLN
INTRAMUSCULAR | Status: DC | PRN
  Administered 2020-04-20: 35 mg via INTRAVENOUS

## 2020-04-20 MED ORDER — KETAMINE HCL 10 MG/ML IJ SOLN
INTRAMUSCULAR | Status: AC
  Filled 2020-04-20: qty 1

## 2020-04-20 MED ORDER — HYDRALAZINE HCL 20 MG/ML IJ SOLN
INTRAMUSCULAR | Status: AC
  Filled 2020-04-20: qty 1

## 2020-04-20 MED ORDER — ONDANSETRON HCL 4 MG/2ML IJ SOLN: 4.0000 mg | Freq: Four times a day (QID) | INTRAMUSCULAR | Status: DC | PRN

## 2020-04-20 MED ORDER — ASPIRIN EC 81 MG PO TBEC
81.0000 mg | DELAYED_RELEASE_TABLET | Freq: Every day | ORAL | Status: DC
  Administered 2020-04-21: 81 mg via ORAL
  Filled 2020-04-20: qty 1

## 2020-04-20 SURGICAL SUPPLY — 98 items
ADH SKN CLS APL DERMABOND .7 (GAUZE/BANDAGES/DRESSINGS) ×2
AGENT HMST KT MTR STRL THRMB (HEMOSTASIS) ×2
APL ESCP 34 STRL LF DISP (HEMOSTASIS)
APL PRP STRL LF DISP 70% ISPRP (MISCELLANEOUS) ×4
APPLICATOR SURGIFLO ENDO (HEMOSTASIS) IMPLANT
APPLIER CLIP 5 13 M/L LIGAMAX5 (MISCELLANEOUS)
APPLIER CLIP ROT 10 11.4 M/L (STAPLE)
APR CLP MED LRG 11.4X10 (STAPLE)
APR CLP MED LRG 5 ANG JAW (MISCELLANEOUS)
BAG LAPAROSCOPIC 12 15 PORT 16 (BASKET) IMPLANT
BAG RETRIEVAL 12/15 (BASKET) ×3
BAG SPEC RTRVL 10 TROC 200 (ENDOMECHANICALS) ×2
BAG SPEC RTRVL LRG 6X4 10 (ENDOMECHANICALS) ×2
BLADE SURG SZ11 CARB STEEL (BLADE) ×3 IMPLANT
CHLORAPREP W/TINT 26 (MISCELLANEOUS) ×6 IMPLANT
CLIP APPLIE 5 13 M/L LIGAMAX5 (MISCELLANEOUS) IMPLANT
CLIP APPLIE ROT 10 11.4 M/L (STAPLE) IMPLANT
CLIP VESOLOCK LG 6/CT PURPLE (CLIP) ×4 IMPLANT
CLIP VESOLOCK MED LG 6/CT (CLIP) ×6 IMPLANT
COVER SURGICAL LIGHT HANDLE (MISCELLANEOUS) ×6 IMPLANT
COVER TIP SHEARS 8 DVNC (MISCELLANEOUS) ×4 IMPLANT
COVER TIP SHEARS 8MM DA VINCI (MISCELLANEOUS) ×6
COVER WAND RF STERILE (DRAPES) IMPLANT
CUTTER ECHEON FLEX ENDO 45 340 (ENDOMECHANICALS) ×1 IMPLANT
DECANTER SPIKE VIAL GLASS SM (MISCELLANEOUS) ×6 IMPLANT
DERMABOND ADVANCED (GAUZE/BANDAGES/DRESSINGS) ×1
DERMABOND ADVANCED .7 DNX12 (GAUZE/BANDAGES/DRESSINGS) ×4 IMPLANT
DEVICE TROCAR PUNCTURE CLOSURE (ENDOMECHANICALS) IMPLANT
DRAIN CHANNEL 15F RND FF 3/16 (WOUND CARE) ×3 IMPLANT
DRAPE ARM DVNC X/XI (DISPOSABLE) ×16 IMPLANT
DRAPE COLUMN DVNC XI (DISPOSABLE) ×4 IMPLANT
DRAPE DA VINCI XI ARM (DISPOSABLE) ×8
DRAPE DA VINCI XI COLUMN (DISPOSABLE) ×6
DRAPE INCISE IOBAN 66X45 STRL (DRAPES) ×4 IMPLANT
DRAPE SHEET LG 3/4 BI-LAMINATE (DRAPES) ×3 IMPLANT
ELECT PENCIL ROCKER SW 15FT (MISCELLANEOUS) ×3 IMPLANT
ELECT REM PT RETURN 15FT ADLT (MISCELLANEOUS) ×6 IMPLANT
EVACUATOR SILICONE 100CC (DRAIN) ×3 IMPLANT
GAUZE 4X4 16PLY RFD (DISPOSABLE) ×3 IMPLANT
GAUZE SPONGE 2X2 8PLY STRL LF (GAUZE/BANDAGES/DRESSINGS) IMPLANT
GLOVE SRG 8 PF TXTR STRL LF DI (GLOVE) ×4 IMPLANT
GLOVE SURG ENC MOIS LTX SZ6.5 (GLOVE) ×3 IMPLANT
GLOVE SURG ENC TEXT LTX SZ7.5 (GLOVE) ×6 IMPLANT
GLOVE SURG LTX SZ8 (GLOVE) ×6 IMPLANT
GLOVE SURG UNDER POLY LF SZ8 (GLOVE) ×6
GOWN STRL REUS W/TWL LRG LVL3 (GOWN DISPOSABLE) ×9 IMPLANT
GOWN STRL REUS W/TWL XL LVL3 (GOWN DISPOSABLE) ×6 IMPLANT
HEMOSTAT SURGICEL 4X8 (HEMOSTASIS) IMPLANT
HOLDER FOLEY CATH W/STRAP (MISCELLANEOUS) ×3 IMPLANT
IRRIG SUCT STRYKERFLOW 2 WTIP (MISCELLANEOUS) ×6
IRRIGATION SUCT STRKRFLW 2 WTP (MISCELLANEOUS) ×2 IMPLANT
KIT BASIN OR (CUSTOM PROCEDURE TRAY) ×6 IMPLANT
KIT TURNOVER KIT A (KITS) ×6 IMPLANT
NDL INSUFFLATION 14GA 120MM (NEEDLE) ×2 IMPLANT
NDL SPNL 22GX7 QUINCKE BK (NEEDLE) IMPLANT
NEEDLE HYPO 22GX1.5 SAFETY (NEEDLE) ×3 IMPLANT
NEEDLE INSUFFLATION 14GA 120MM (NEEDLE) ×3 IMPLANT
NEEDLE SPNL 22GX7 QUINCKE BK (NEEDLE) ×3 IMPLANT
PACK CARDIOVASCULAR III (CUSTOM PROCEDURE TRAY) ×3 IMPLANT
PAD POSITIONING PINK XL (MISCELLANEOUS) ×3 IMPLANT
PENCIL SMOKE EVACUATOR (MISCELLANEOUS) IMPLANT
POUCH RETRIEVAL ECOSAC 10 (ENDOMECHANICALS) IMPLANT
POUCH RETRIEVAL ECOSAC 10MM (ENDOMECHANICALS) ×2
POUCH SPECIMEN RETRIEVAL 10MM (ENDOMECHANICALS) ×3 IMPLANT
PROTECTOR NERVE ULNAR (MISCELLANEOUS) ×6 IMPLANT
RELOAD STAPLE 45 2.6 WHT THIN (STAPLE) IMPLANT
SCISSORS LAP 5X35 DISP (ENDOMECHANICALS) IMPLANT
SEAL CANN UNIV 5-8 DVNC XI (MISCELLANEOUS) ×16 IMPLANT
SEAL XI 5MM-8MM UNIVERSAL (MISCELLANEOUS) ×12
SEALER VESSEL DA VINCI XI (MISCELLANEOUS)
SEALER VESSEL EXT DVNC XI (MISCELLANEOUS) IMPLANT
SET TUBE SMOKE EVAC HIGH FLOW (TUBING) ×3 IMPLANT
SOLUTION ELECTROLUBE (MISCELLANEOUS) ×6 IMPLANT
SPONGE GAUZE 2X2 STER 10/PKG (GAUZE/BANDAGES/DRESSINGS)
STAPLE RELOAD 45 WHT (STAPLE) ×4 IMPLANT
STAPLE RELOAD 45MM WHITE (STAPLE) ×6
STAPLER VISISTAT 35W (STAPLE) IMPLANT
SURGIFLO W/THROMBIN 8M KIT (HEMOSTASIS) ×3 IMPLANT
SUT ETHILON 3 0 PS 1 (SUTURE) ×3 IMPLANT
SUT MNCRL AB 4-0 PS2 18 (SUTURE) ×9 IMPLANT
SUT PROLENE 2 0 KS (SUTURE) ×3 IMPLANT
SUT V-LOC BARB 180 2/0GR6 GS22 (SUTURE) ×3
SUT VIC AB 0 CT1 27 (SUTURE) ×3
SUT VIC AB 0 CT1 27XBRD ANTBC (SUTURE) ×2 IMPLANT
SUT VICRYL 0 UR6 27IN ABS (SUTURE) ×3 IMPLANT
SUT VLOC BARB 180 ABS3/0GR12 (SUTURE) ×3
SUTURE V-LC BRB 180 2/0GR6GS22 (SUTURE) ×2 IMPLANT
SUTURE VLOC BRB 180 ABS3/0GR12 (SUTURE) ×2 IMPLANT
SYR CONTROL 10ML LL (SYRINGE) ×3 IMPLANT
TOWEL OR 17X26 10 PK STRL BLUE (TOWEL DISPOSABLE) ×6 IMPLANT
TOWEL OR NON WOVEN STRL DISP B (DISPOSABLE) ×6 IMPLANT
TRAY FOLEY MTR SLVR 16FR STAT (SET/KITS/TRAYS/PACK) ×3 IMPLANT
TRAY LAPAROSCOPIC (CUSTOM PROCEDURE TRAY) ×3 IMPLANT
TROCAR ADV FIXATION 5X100MM (TROCAR) IMPLANT
TROCAR BLADELESS OPT 5 100 (ENDOMECHANICALS) ×1 IMPLANT
TROCAR XCEL 12X100 BLDLESS (ENDOMECHANICALS) ×3 IMPLANT
TUBING INSUFFLATION 10FT LAP (TUBING) ×3 IMPLANT
WATER STERILE IRR 1000ML POUR (IV SOLUTION) ×3 IMPLANT

## 2020-04-20 NOTE — Anesthesia Procedure Notes (Signed)
Arterial Line Insertion Start/End3/04/2020 11:15 AM, 04/20/2020 11:20 AM Performed by: Effie Berkshire, MD  Patient location: Pre-op. Preanesthetic checklist: patient identified, IV checked, site marked, risks and benefits discussed, surgical consent, monitors and equipment checked, pre-op evaluation, timeout performed and anesthesia consent Lidocaine 1% used for infiltration Left, radial was placed Catheter size: 20 Fr Hand hygiene performed  and maximum sterile barriers used   Attempts: 1 Procedure performed without using ultrasound guided technique. Following insertion, dressing applied and Biopatch. Post procedure assessment: normal and unchanged  Patient tolerated the procedure well with no immediate complications.

## 2020-04-20 NOTE — Op Note (Signed)
Preoperative diagnosis: Left renal neoplasm  Postoperative diagnosis: Left renal neoplasm  Procedure:  1. Left robotic-assisted laparoscopic radical nephrectomy and adrenalectomy  Surgeon: Pryor Curia. M.D.  Assistant(s): Debbrah Alar, PA-C  An assistant was required for this surgical procedure.  The duties of the assistant included but were not limited to suctioning, passing suture, camera manipulation, retraction. This procedure would not be able to be performed without an Environmental consultant.  Anesthesia: General  Complications: None  EBL: 200 mL  IVF:  1700 mL crystalloid  Specimens: 1. Left renal neoplasm  Disposition of specimens: Pathology  Indication:  Kenneth Osborn is a 65 y.o. year old patient with a left renal neoplasm.  This was detected after he presented with acute cholecystitis requiring percutaneous drainage.  After a thorough review of the management options for their renal mass, they elected to proceed with surgical treatment with hopes for a partial nephrectomy with a possibility of radical nephrectomy along with cholecystectomy by general surgery.  We have discussed the potential benefits and risks of the procedure, side effects of the proposed treatment, the likelihood of the patient achieving the goals of the procedure, and any potential problems that might occur during the procedure or recuperation. Informed consent has been obtained.   Description of procedure:  The patient was taken to the operating room and a general anesthetic was administered. The patient was given preoperative antibiotics, placed in the left modified flank position with care to pad all potential pressure points, and prepped and draped in the usual sterile fashion. Next a preoperative timeout was performed.  A site was selected in the upper midline for initial port placement. This was placed using a standard open Hassan technique which allowed entry into the peritoneal cavity under  direct vision and without difficulty. A 12 mm port was placed and a pneumoperitoneum established. The camera was then used to inspect the abdomen and there was no evidence of any intra-abdominal injuries or other abnormalities. The remaining abdominal ports were then placed. 8 mm robotic ports were placed in the left upper quadrant, left lower quadrant, and far left lateral abdominal wall. A 8 mm port was placed to the left of the midline just off the rectus muscle for the camera.. All ports were placed under direct vision without difficulty. The surgical cart was then docked.   Utilizing the cautery scissors, the white line of Toldt was incised allowing the colon to be mobilized medially and the plane between the mesocolon and the anterior layer of Gerota's fascia to be developed and the kidney to be exposed.  The ureter and gonadal vein were identified inferiorly and the ureter was lifted anteriorly off the psoas muscle.  Dissection proceeded superiorly along the gonadal vein until the renal vein was identified.  The renal hilum was then carefully isolated with a combination of blunt and sharp dissection allowing the renal arterial and venous structures to be separated and isolated in preparation for renal hilar vessel clamping. There was a single renal artery and renal vein.   Attention turned to the kidney and the perinephric fat surrounding the renal mass was removed and the kidney was mobilized sufficiently for exposure and resection of the renal mass. The perinephric fat was densely adherent to the renal capsule making this dissection extremely difficult.  A significant effort was made to dissect the kidney out within Gerota's fascia but the upper pole was densely adherent to the adrenal gland and the upper pole attachments.  After an attempt at  dissection, it was felt that the tumor, which was large and involving the posterior upper pole, it was decided that it would be difficult to perform an adequate  nephron sparing procedure and it was decided to proceed with a radical nephrectomy.    The renal artery was isolated and ligated with multiple Weck clips and subsequently divided.  The renal vein was then isolated and also ligated and divided with a 45 mm Flex ETS stapler.  Dissection then proceeded outside Gerota's fascia and the adrenal vein was also divided and ligated with the 45 mm stapler. The splenorenal ligaments were divided with the harmonic scalpel.  The lateral and posterior attachements to the kidney were then divided.  The ureter was ligated with Weck clips and divided allowing the specimen to be freed from all surrounding structures.  The kidney specimen was then placed into a 12 mm Endocatch II retrieval bag.  The renal hilum, liver, adrenal bed and gonadal vein areas were each inspected and hemostasis was ensured with the pneomperitoneal pressures lowered. The procedure was then turned over to Dr. Johney Maine for his portion of the procedure which will be dictated separately.  After his procedure, the kidney specimen was removed intact within the retrieval bag via the camera port site after this incision was extended slightly. This fascial opening was then closed with two #1 PDS sutures.    All incisions were injected with local anesthetic and reapproximated at the skin with 4-0 monocryl sutures.  Dermabond was applied to the skin. The patient tolerated the procedure well and without complications and was transferred to the recovery unit in satisfactory condition.   Pryor Curia MD

## 2020-04-20 NOTE — Anesthesia Procedure Notes (Signed)
Procedure Name: Intubation Date/Time: 04/20/2020 12:29 PM Performed by: Eben Burow, CRNA Pre-anesthesia Checklist: Patient identified, Emergency Drugs available, Suction available, Patient being monitored and Timeout performed Patient Re-evaluated:Patient Re-evaluated prior to induction Oxygen Delivery Method: Circle system utilized Preoxygenation: Pre-oxygenation with 100% oxygen Induction Type: IV induction Ventilation: Oral airway inserted - appropriate to patient size and Mask ventilation without difficulty Laryngoscope Size: Mac and 4 Grade View: Grade I Tube size: 7.5 mm Number of attempts: 1 Airway Equipment and Method: Stylet Placement Confirmation: ETT inserted through vocal cords under direct vision,  positive ETCO2 and breath sounds checked- equal and bilateral Secured at: 23 cm Tube secured with: Tape Dental Injury: Teeth and Oropharynx as per pre-operative assessment  Comments: Intubation performed by Patterson Hammersmith.  CRNA Jefm Miles and anesthesiologist Dr. Smith Robert present.

## 2020-04-20 NOTE — Transfer of Care (Signed)
Immediate Anesthesia Transfer of Care Note  Patient: SANDIP POWER  Procedure(s) Performed: XI ROBOTIC ASSITED LAPAROSCOPIC NEPHRECTOMY (Left ) XI ROBOTIC ASSISTED LAPAROSCOPIC CHOLECYSTECTOMY WITH WEDGE RESECTION OF LIVER (N/A )  Patient Location: PACU  Anesthesia Type:General  Level of Consciousness: awake, drowsy and patient cooperative  Airway & Oxygen Therapy: Patient Spontanous Breathing and Patient connected to face mask oxygen  Post-op Assessment: Report given to RN and Post -op Vital signs reviewed and stable  Post vital signs: Reviewed and stable  Last Vitals:  Vitals Value Taken Time  BP 166/75 04/20/20 1800  Temp    Pulse 97 04/20/20 1806  Resp 26 04/20/20 1806  SpO2 99 % 04/20/20 1806  Vitals shown include unvalidated device data.  Last Pain:  Vitals:   04/20/20 0957  TempSrc: Oral         Complications: No complications documented.

## 2020-04-20 NOTE — Interval H&P Note (Signed)
History and Physical Interval Note:  04/20/2020 11:33 AM  Kenneth Osborn  has presented today for surgery, with the diagnosis of CHRONIC CHOLECYSTITIS The various methods of treatment have been discussed with the patient and family. After consideration of risks, benefits and other options for treatment, the patient has consented to   as a surgical intervention.  The patient's history has been reviewed, patient examined, no change in status, stable for surgery.  I have reviewed the patient's chart and labs.  Questions were answered to the patient's satisfaction.    The anatomy & physiology of hepatobiliary & pancreatic function was discussed.  The pathophysiology of gallbladder dysfunction was discussed.  Natural history risks without surgery was discussed.   I feel the risks of no intervention will lead to serious problems that outweigh the operative risks; therefore, I recommended cholecystectomy to remove the pathology.  I explained laparoscopic techniques with possible need for an open approach.  Probable cholangiogram to evaluate the bilary tract was explained as well.    Risks such as bleeding, infection, diarrhea and other bowel changes, abscess, leak, injury to other organs, need for repair of tissues / organs, need for further treatment, stroke, heart attack, death, and other risks were discussed.  I noted a good likelihood this will help address the problem, but there is a chance it may not help.  Possibility that this will not correct all abdominal symptoms was explained.  Goals of post-operative recovery were discussed as well.  We will work to minimize complications.  An educational handout further explaining the pathology and treatment options was given as well.  Questions were answered.  The patient expresses understanding & wishes to proceed with surgery.  I have re-reviewed the the patient's records, history, medications, and allergies.  I have re-examined the patient.  I again discussed  intraoperative plans and goals of post-operative recovery.  The patient agrees to proceed.  Kenneth Osborn  04-25-1955 [id number]  Patient Care Team: [name], MD as PCP - General (Family Medicine) Michael Boston, MD as Consulting Physician (General Surgery) Raynelle Bring, MD as Consulting Physician (Urology) Lonia Farber, MD as Consulting Physician (Endocrinology) Corey Skains, MD as Consulting Physician (Cardiology)  Patient Active Problem List   Diagnosis Date Noted   Elevated LFTs    Renal mass    Hypokalemia    Acute acalculous cholecystitis 02/07/2020   Severe sepsis ([place]) 02/07/2020   Chronic low back pain (Bilateral) w/o sciatica 12/16/2019   Hyperglycemia 12/16/2019   Pharmacologic therapy 08/03/2019   Disorder of skeletal system 08/03/2019   Problems influencing health status 08/03/2019   Postop check 12/17/2018   Acute postoperative pain 12/03/2018   DDD (degenerative disc disease), lumbosacral 09/21/2018   Diabetic polyneuropathy associated with diabetes mellitus due to underlying condition ([place]) 09/10/2018   Neuropathic pain 09/10/2018   Musculoskeletal pain 09/10/2018   AKI (acute kidney injury) ([place]) 02/19/2018   Frequent falls 02/07/2018   Hyperkalemia 01/18/2018   Spondylosis without myelopathy or radiculopathy, lumbosacral region 11/13/2017   Cardiac syncope 09/08/2017   History of coronary artery disease 09/03/2017   Spinal cord stimulator dysfunction ([place]) 12/26/2016   Trigger point posterior superior iliac spine (PSIS) (Right) 12/25/2016   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) 12/25/2016   Polyneuropathy 12/18/2016   Numbness and tingling 11/12/2016   Tachycardia with heart rate 100-120 beats per minute 11/12/2016   Meniere disease, bilateral 11/12/2016   Headache disorder 11/12/2016   Type 2 diabetes mellitus with diabetic neuropathy ([place])  11/12/2016   Type 2 diabetes mellitus with diabetic polyneuropathy, with long-term  current use of insulin ([place]) 11/12/2016   Tremor 09/11/2016   Chronic pain syndrome 05/28/2016   Bilateral carotid artery stenosis 01/24/2016   Mild cognitive impairment 11/27/2015   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) 11/27/2015   Balance problem 10/09/2015   Chronic tension-type headache, not intractable 10/09/2015   Dizziness 10/09/2015   Post-concussion headache 10/09/2015   PSVT (paroxysmal supraventricular tachycardia) ([place]) 08/31/2015   Diabetes mellitus, type II ([place]) 06/14/2015   Unstable angina ([place]) 06/08/2015   Encounter for interrogation of neurostimulator 03/22/2015   [employer] spinal cord stimulator (implant date: 11/01/2014) 03/22/2015   Type 2 diabetes mellitus with hyperglycemia ([place]) 03/15/2015   Uncontrolled type 2 diabetes mellitus with hyperglycemia, with long-term current use of insulin ([place]) 03/15/2015   DM type 2 with diabetic peripheral neuropathy ([place]) 03/15/2015   Acid reflux 12/21/2014   OSA (obstructive sleep apnea) 12/21/2014   S/P insertion of spinal cord stimulator 12/21/2014   Chronic low back pain (Bilateral) (L>R) w/ sciatica (Left) 12/21/2014   Failed cervical surgery syndrome (C5-6 ACDF by Dr. Beverely Pace at [facility] on 01/04/2013) 12/21/2014   Long term current use of opiate analgesic 12/21/2014   Long term prescription opiate use 12/21/2014   Opiate use (60 MME/Day) 12/21/2014   Opiate dependence ([place]) 12/21/2014   Encounter for therapeutic drug level monitoring 12/21/2014   Neurogenic pain 12/21/2014   Thoracic facet syndrome (T8-10) 12/21/2014   Lumbar facet syndrome (Bilateral) (R>L) 12/21/2014   Cervical facet syndrome (Right) 12/21/2014   Cervical spondylosis 12/21/2014   Lumbar spondylosis 12/21/2014   Chronic upper extremity pain (Left) 12/21/2014   Chronic cervical radicular pain (Left) 12/21/2014   Chronic upper back pain 12/21/2014   History of thoracic spine surgery (S/P T9-10 IVD spacer) 12/21/2014   Failed  back surgical syndrome 12/21/2014   Chronic musculoskeletal pain 12/21/2014   Myofascial pain 12/21/2014   Chronic lower extremity pain (Left) 12/21/2014   Chronic radicular lumbar pain (left L4 dermatomal pain) 12/21/2014   Coronary artery disease 12/21/2014   History of MI (myocardial infarction) (May 2012) 12/21/2014   Chronic anticoagulation ([place]) 12/21/2014   History of Meniere's disease 12/21/2014   Sleep apnea 12/21/2014   History of spinal surgery 11/14/2014   CKD (chronic kidney disease) stage 3, GFR 30-59 ml/min (HCC) 09/26/2014   Hypotension due to hypovolemia 09/22/2014   Benign essential HTN 09/14/2014   Hypertension associated with diabetes ([place]) 09/14/2014   Barrett esophagus 08/12/2014   H/O adenomatous polyp of colon 08/12/2014   Dysphagia 08/12/2014   GERD (gastroesophageal reflux disease) 01/25/2014   Arteriosclerosis of coronary artery 08/13/2013   Hyperlipidemia 08/13/2013   CAD (coronary artery disease) 08/13/2013   Hyperlipidemia associated with type 2 diabetes mellitus ([place]) 08/13/2013   Auditory vertigo 12/30/2012   Cervical spinal stenosis 12/11/2012   Arthralgia of shoulder 03/16/2012    Past Medical History:  Diagnosis Date   Acute postoperative pain 12/03/2018   Allergic rhinitis 12/30/2012   Anginal pain ([place])    Anxiety    Bronchitis    hx of   Can't get food down 08/12/2014   Cancer ([place]) 01/2020   kidney    Chronic back pain    thoracic area   Concussion 09/2015   Coronary artery disease    99% blockage   DDD (degenerative disc disease), cervical    Dehydration symptoms    2019   Diabetes mellitus without complication ([place])  insulin dependent   Dysphagia    GERD (gastroesophageal reflux disease)    History of Meniere's disease 12/21/2014   History of thoracic spine surgery (S/P T9-10 IVD spacer) 12/21/2014   Hypercholesteremia    Hyperlipidemia    Hypertension    sees Dr. Jenny Reichmann [facility]   Meniere's disease    deaf in  right ear, takes diazepam   Myocardial infarction [facility]) 2012   Se[facility] Dr. Drema [place]   Neuromuscular disorder [facility])    diabetic neuropathy in feet   Short-segment Barrett's esophagus    Sleep apnea    mild    Past Surgical History:  Procedure Laterality Date   APPENDECTOMY     age 29   ARTHRODESIS ANTERIOR ANTERIOR CERVICLE SPINE  01/04/2013   BACK SURGERY     fusion thoracic area   CARDIAC CATHETERIZATION     may 2012 and Nov 20, 2010   CARDIAC CATHETERIZATION N/A 06/15/2015   Procedure: Left Heart Cath and Coronary Angiography;  Surgeon: Corey Skains, MD;  Location: [place] CV LAB;  Service: Cardiovascular;  Laterality: N/A;   CARDIAC CATHETERIZATION N/A 06/15/2015   Procedure: Coronary Stent Intervention;  Surgeon: Isaias Cowman, MD;  Location: [place] CV LAB;  Service: Cardiovascular;  Laterality: N/A;   CARDIOVASCULAR STRESS TEST     jan 2014   COLONOSCOPY WITH PROPOFOL N/A 09/19/2014   Procedure: COLONOSCOPY WITH PROPOFOL;  Surgeon: Manya Silvas, MD;  Location: [facility] ENDOSCOPY;  Service: Endoscopy;  Laterality: N/A;   CORONARY ANGIOPLASTY     stent placement x3   ESOPHAGOGASTRODUODENOSCOPY N/A 09/19/2014   Procedure: ESOPHAGOGASTRODUODENOSCOPY (EGD);  Surgeon: Manya Silvas, MD;  Location: [facility] ENDOSCOPY;  Service: Endoscopy;  Laterality: N/A;   IR PERC CHOLECYSTOSTOMY  02/09/2020   IR RADIOLOGIST EVAL & MGMT  03/14/2020   LABRINTHECTOMY     1999 right ear   LUMBAR SPINAL CORD SIMULATOR LEAD REMOVAL Right 08/09/2019   Procedure: REMOVAL SPINAL CORD STIMULATOR PERCUTANEOUS LEADS, REMOVAL PULSE GENERATOR;  Surgeon: [name], MD;  Location: [facility] ORS;  Service: Neurosurgery;  Laterality: Right;  LOCAL WITH MAC   mastoid shunt Bilateral    left, 2002, 1997 right ear, 1980 right ear   PULSE GENERATOR IMPLANT N/A 01/18/2019   Procedure: [employer] SPINAL CORD STIMULATOR BATTERY EXCHANGE;  Surgeon: [name], MD;  Location: [facility] ORS;   Service: Neurosurgery;  Laterality: N/A;   SAVORY DILATION N/A 09/19/2014   Procedure: SAVORY DILATION;  Surgeon: Manya Silvas, MD;  Location: [facility] ENDOSCOPY;  Service: Endoscopy;  Laterality: N/A;   SHOULDER ARTHROSCOPY WITH SUBACROMIAL DECOMPRESSION Left 04/06/2012   Procedure: SHOULDER ARTHROSCOPY WITH SUBACROMIAL DECOMPRESSION;  Surgeon: Vickey Huger, MD;  Location: [place];  Service: Orthopedics;  Laterality: Left;  left shoulder arthroscopy, subacromial decompression and distal clavicle resection   SPINAL CORD STIMULATOR IMPLANT Right     Social History   Socioeconomic History   Marital status: Married    Spouse name: Not on file   Number of children: Not on file   Years of education: Not on file   Highest education level: Not on file  Occupational History   Not on file  Tobacco Use   Smoking status: Never Smoker   Smokeless tobacco: Never Used  Vaping Use   Vaping Use: Never used  Substance and Sexual Activity   Alcohol use: No   Drug use: No   Sexual activity: Not Currently  Other Topics Concern   Not on file  Social History Narrative  Patient hospitalized x 3 events for high K+ Pt had surgery on the 02/09/20   Social Determinants of Health   Financial Resource Strain: Not on file  Food Insecurity: Not on file  Transportation Needs: Not on file  Physical Activity: Not on file  Stress: Not on file  Social Connections: Not on file  Intimate Partner Violence: Not on file    Family History  Problem Relation Age of Onset   Heart disease Mother    Diabetes Mother    Heart disease Father    Cancer Sister    Heart disease Maternal Aunt    Heart disease Maternal Uncle    Diabetes Maternal Grandmother    Diabetes Paternal Grandmother     Medications Prior to Admission  Medication Sig Dispense Refill Last Dose   ALPRAZolam (XANAX) 0.5 MG tablet Take 0.5 mg by mouth at bedtime.   Past Month at Unknown time   aspirin EC 81 MG tablet Take 81 mg by mouth at bedtime.  Swallow whole.   Past Week at Unknown time   cetirizine (ZYRTEC) 10 MG tablet Take 10 mg by mouth daily.    04/19/2020 at Unknown time   cyclobenzaprine (FLEXERIL) 10 MG tablet Take 10 mg by mouth 3 (three) times daily as needed for muscle spasms.   Past Week at Unknown time   diazepam (VALIUM) 5 MG tablet Take 5 mg by mouth See admin instructions. Take 5 mg in the morning and additional if needed for Meniere's disease up to three a day   04/19/2020 at Unknown time   diltiazem (CARDIZEM CD) 240 MG 24 hr capsule Take 240 mg by mouth daily.   04/20/2020 at 0730   docusate sodium (COLACE) 100 MG capsule Take 100 mg by mouth daily as needed for mild constipation.   04/19/2020 at Unknown time   enalapril (VASOTEC) 10 MG tablet Take 10 mg by mouth 2 (two) times daily.    04/19/2020 at Unknown time   esomeprazole (NEXIUM) 40 MG capsule Take 40 mg by mouth 2 (two) times daily.   04/19/2020 at Unknown time   ezetimibe (ZETIA) 10 MG tablet Take 10 mg by mouth daily.   04/19/2020 at Unknown time   gabapentin (NEURONTIN) 800 MG tablet Take 1,600 mg by mouth 2 (two) times daily.   04/19/2020 at Unknown time   Glucagon (BAQSIMI ONE PACK NA) Place 1 Dose into the nose daily as needed (severely low blood sugar).   Past Week at Unknown time   HUMULIN R U-500 KWIKPEN 500 UNIT/ML kwikpen Inject 40 Units into the skin 3 (three) times daily with meals. (Patient taking differently: Inject 65-75 Units into the skin See admin instructions. Sliding scale  Can adjust according to blood sugar Inject 75 units with breakfast, 75 units with lunch, 65 units with dinner)  0 04/19/2020 at Unknown time   hydrocortisone 2.5 % cream Apply 1 application topically daily as needed for hemorrhoids.   Past Week at Unknown time   isosorbide mononitrate (IMDUR) 30 MG 24 hr tablet Take 30 mg by mouth at bedtime.   04/20/2020 at 0730   Magnesium Oxide 500 MG TABS Take 500 mg by mouth daily.   Past Week at Unknown time   metFORMIN (GLUCOPHAGE) 1000 MG tablet Take  1,000 mg by mouth 2 (two) times daily with a meal.   04/19/2020 at Unknown time   metoprolol succinate (TOPROL-XL) 100 MG 24 hr tablet Take 100 mg by mouth 2 (two) times daily. Take with  or immediately following a meal.   04/20/2020 at 0730   montelukast (SINGULAIR) 10 MG tablet Take 10 mg by mouth at bedtime.    04/19/2020 at Unknown time   niacin (NIASPAN) 500 MG CR tablet Take 500 mg by mouth at bedtime.   Past Week at Unknown time   Omega-3 Fatty Acids (FISH OIL) 1200 MG CAPS Take 1,200 mg by mouth 2 (two) times daily.   Past Month at Unknown time   Oxycodone HCl 10 MG TABS Take 1 tablet (10 mg total) by mouth every 6 (six) hours as needed. Must last 30 days 120 tablet 0 04/20/2020 at 0730   [START ON 05/24/2020] Oxycodone HCl 10 MG TABS Take 1 tablet (10 mg total) by mouth every 6 (six) hours as needed. Must last 30 days 120 tablet 0    prasugrel (EFFIENT) 10 MG TABS tablet Take 10 mg by mouth daily.   Past Month at Unknown time   rosuvastatin (CRESTOR) 40 MG tablet Take 40 mg by mouth at bedtime.   04/19/2020 at Unknown time   sodium chloride 0.9 % injection Flush the drain daily with 5 ml 70 mL 2 Past Month at Unknown time   meclizine (ANTIVERT) 25 MG tablet Take 25 mg by mouth See admin instructions. Take 25 mg in the morning, and additional 25 mg if needed up to a total of three per day for Meniere's disease   More than a month at Unknown time   [START ON 04/24/2020] Oxycodone HCl 10 MG TABS Take 1 tablet (10 mg total) by mouth every 6 (six) hours as needed. Must last 30 days 120 tablet 0     Current Facility-Administered Medications  Medication Dose Route Frequency Provider Last Rate Last Admin   bupivacaine liposome (EXPAREL) 1.3 % injection 266 mg  20 mL Infiltration Once Raynelle Bring, MD       ceFAZolin (ANCEF) IVPB 2g/100 mL premix  2 g Intravenous Once Raynelle Bring, MD       Chlorhexidine Gluconate Cloth 2 % PADS 6 each  6 each Topical Once Michael Boston, MD       And   Chlorhexidine  Gluconate Cloth 2 % PADS 6 each  6 each Topical Once Michael Boston, MD       Derrill Memo ON 04/21/2020] feeding supplement (ENSURE PRE-SURGERY) liquid 296 mL  296 mL Oral Once Michael Boston, MD       lactated ringers infusion   Intravenous Continuous Stoltz[place]us, March Rummage, DO 10 mL/hr at 04/20/20 1032 Continued from Pre-op at 04/20/20 1032     No Known Allergies  BP (!) 148/91   Pulse (!) 108   Temp 98 F (36.7 C) (Oral)   Resp 16   SpO2 94%   Labs: Results for orders placed or performed during the hospital encounter of 04/20/20 (from the past 48 hour(s))  Glucose, capillary     Status: Abnormal   Collection Time: 04/20/20 10:00 AM  Result Value Ref Range   Glucose-Capillary 216 (H) 70 - 99 mg/dL    Comment: Glucose reference range applies only to samples taken after fasting for at least 8 hours.   Comment 1 Notify RN    Comment 2 Document in Chart   Type and screen [place]     Status: None   Collection Time: 04/20/20 10:15 AM  Result Value Ref Range   ABO/RH(D) O POS    Antibody Screen NEG    Sample Expiration  04/23/2020,2359 Performed at [facility], [place] [address] [place],  [address]     Imaging / Studies: No results found.   [name], M.D., F.A.C.S. Gastrointestinal and Minimally Invasive Surgery [facility] [address] [place],  [address] [phone] Main / Paging  04/20/2020 11:34 AM     [name]

## 2020-04-20 NOTE — Discharge Instructions (Signed)
1.  Activity:  You are encouraged to ambulate frequently (about every hour during waking hours) to help prevent blood clots from forming in your legs or lungs.  However, you should not engage in any heavy lifting (> 10-15 lbs), strenuous activity, or straining. 2. Diet: You should advance your diet as instructed by your physician.  It will be normal to have some bloating, nausea, and abdominal discomfort intermittently. 3. Prescriptions:  You will be provided a prescription for pain medication to take as needed.  If your pain is not severe enough to require the prescription pain medication, you may take extra strength Tylenol instead which will have less side effects.  You should also take a prescribed stool softener to avoid straining with bowel movements as the prescription pain medication may constipate you. 4. Incisions: You may remove your dressing bandages 48 hours after surgery if not removed in the hospital.  You will either have some small staples or special tissue glue at each of the incision sites. Once the bandages are removed (if present), the incisions may stay open to air.  You may start showering (but not soaking or bathing in water) the 2nd day after surgery and the incisions simply need to be patted dry after the shower.  No additional care is needed. 5. What to call us about: You should call the office [phone]) if you develop fever > 101 or develop persistent vomiting.  You can resume advil, aleve, vitamins, and supplements 7 days after surgery.  You can resume Effient in

## 2020-04-20 NOTE — Op Note (Signed)
04/20/2020  PATIENT:  Kenneth Osborn  65 y.o. male  Patient Care Team: [name], MD as PCP - General (Family Medicine) Michael Boston, MD as Consulting Physician (General Surgery) Raynelle Bring, MD as Consulting Physician (Urology) Lonia Farber, MD as Consulting Physician (Endocrinology) Corey Skains, MD as Consulting Physician (Cardiology)  PRE-OPERATIVE DIAGNOSIS:    Acute on Chronic Calculus Cholecystitis  POST-OPERATIVE DIAGNOSIS:   Acute on Chronic Calculus Cholecystitis  Liver: Fatty steatohepatitis  PROCEDURE:   ROBOTIC CHOLECYSTECTOMY ROBOTIC LYSIS OF ADHESIONS ROBOTIC LIVER WEDGE BIOPSY  SURGEON:  [name], MD, FACS.  ASSISTANT: Raynelle Bring, MD   ANESTHESIA:    General with endotracheal intubation Local anesthetic as a field block  Nerve block provided with liposomal bupivacaine (Experel) mixed with 0.25% bupivacaine as a Left TAP block x 34m t the level of the transverse abdominis & preperitoneal spaces along the flank at the anterior axillary line, from subcostal ridge to iliac crest under laparoscopic guidance    EBL:  (See Anesthesia Intraoperative Record) Total I/O In: 1250 [I.V.:1000; IV Piggyback:250] Out: 1160 [Urine:760; Blood:400]  Delay start of Pharmacological VTE agent (>24hrs) due to surgical blood loss or risk of bleeding:  no  DRAINS: None   SPECIMEN: GALLBLADDER & WEDGE LIVER BIOPSY    DISPOSITION OF SPECIMEN:  PATHOLOGY  COUNTS:  YES  PLAN OF CARE: Admit to inpatient   PATIENT DISPOSITION:  PACU - hemodynamically stable.  INDICATION: Obese gentleman with multiple medical problems status post percutaneous drainage of gallbladder for cholecystitis with sepsis.  Stabilized.  Found to have incidental left renal mass highly suspicious for cancer.  Recommendation made for combined robotic left partial versus total nephrectomy and cholecystectomy.  Seen by Dr. BAlinda Moneyfor urology.  I will manage the  cholecystectomy.  The anatomy & physiology of hepatobiliary & pancreatic function was discussed.  The pathophysiology of gallbladder dysfunction was discussed.  Natural history risks without surgery was discussed.   I feel the risks of no intervention will lead to serious problems that outweigh the operative risks; therefore, I recommended cholecystectomy to remove the pathology.  I explained laparoscopic techniques with possible need for an open approach.  Probable cholangiogram to evaluate the bilary tract was explained as well.    Risks such as bleeding, infection, abscess, leak, injury to other organs, need for further treatment, heart attack, death, and other risks were discussed.  I noted a good likelihood this will help address the problem.  Possibility that this will not correct all abdominal symptoms was explained.  Goals of post-operative recovery were discussed as well.  We will work to minimize complications.  An educational handout further explaining the pathology and treatment options was given as well.  Questions were answered.  The patient expresses understanding & wishes to proceed with surgery.  OR FINDINGS: Thickened inflamed gallbladder.  Very intrahepatic with fatty change of the liver.  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  Percutaneous cholecystostomy tube coming the lateral aspect of the liver and gallbladder.  Removed.  No cholangiogram done.  DESCRIPTION:   Patient was identified and underwent anesthesia and positioning.  Underwent robotic left-sided nephrectomy.  Please see Dr. BLynne Logannote.  He was carefully repositioned positioned supine with arms tucked and foot board with chest and hip belting. SCDs were active during the entire case. The patient underwent general anesthesia without any difficulty.  The abdomen was prepped and draped in a sterile fashion. A Surgical Timeout confirmed our plan.  I placed a 8 mm robotic port  through the supraumbilical incision made by Dr.  Alinda Money from his case.  I began carbon dioxide insufflation.  No change in end tidal CO2 measurement.   Camera inspection revealed no injury. There were no adhesions to the anterior abdominal wall supraumbilically.  Extra ports were carefully placed under laparoscopic visualization.  Patient placed in reverse Trendelenburg right side up.  Xi robot was docked.  I turned attention to the right upper quadrant.  Gallbladder was still quite thickened and inflamed.  There also were dense adhesions of liver to the anterior peritoneum and diaphragm.  These were carefully freed off.  Transected through the prior percutaneous drain.  This allowed better mobility of the liver.  The gallbladder fundus was elevated cephalad. I freed adhesions to the ventral surface of the gallbladder off carefully.   This included transverse colon and duodenal sweep as well as some omentum.  Eventually elevated the gallbladder more to have a better chance of exposing the infundibulum.  I freed the peritoneal coverings between the gallbladder and the liver on the posteriolateral and anteriomedial walls.  Visualization was challenged by the big inflamed gallbladder and the liver not having great mobility as well as a stiff low subcostal ridge.  I transitioned to more of a dome down technique.  End up having to transect through some tongues of liver since the gallbladder was very intrahepatic.  Did some wedge liver resections as a result.  Eventually was able to help free it off the posterior liver bed and help straighten out the gallbladder to focus on the infundibulum.  I mobilized the infundibulum and a lateral to medial in the medial lateral aspect.  I skeletonized and placed a robotic plastic large locking clip to ligate right cystic artery and lymphatic.  Came around posteriorly.  Posterior cystic artery had some bleeding but I was able to carefully dissect around it transect and control it with focused cautery to good result.  This just  left the infundibulum going with the cystic duct down to the porta hepatis.  I carefully skeletonized it.  It was rather foreshortened.  I placed two locking clips on the cystic duct and transected the infundibulum off of it.  I ensured hemostasis on the gallbladder fossa of the liver and elsewhere.  We did copious irrigation.  I inspected the rest of the abdomen & detected no injury nor bleeding elsewhere.  Because the area cleaned up, I did not feel like a another surgical drain need to be placed in the gallbladder fossa or porta hepatis.  I placed the gallbladder and wedge liver biopsies removed the gallbladder inside an EcoSac pouch.  We undocked the robot and removed the ports.  I helped Dr. Alinda Money extend the supraumbilical midline incision to make it large enough to remove his large left kidney as well as the gallbladder.  We closed the midline fascia vertically using #1 PDS running suture.  We closed the skin using 4-0 monocryl stitch.  Dermabond was applied. The patient was extubated & arrived in the PACU in stable condition..  I had discussed postoperative care with the patient in the holding area. I discussed operative findings, updated the patient's status, discussed probable steps to recovery, and gave postoperative recommendations to the patient's spouse, Richard Ritchey.  Recommendations were made.  Questions were answered.  She expressed understanding & appreciation.  [name], M.D., F.A.C.S. Gastrointestinal and Minimally Invasive Surgery [facility] [address], [place], [place] [address] [phone] Main /  Paging  04/20/2020 5:40 PM

## 2020-04-20 NOTE — H&P (Signed)
CC: Left renal neoplasm  Physician requesting consult: Dr. Nickolas Madrid  Primary care physician: Dr. Kary Kos     Family history of kidney cancer: None.  Family history of ESRD: None.   Imaging: 02/07/20  Side of renal neoplasm: Left  Size of renal neoplasm: 6.0 cm  Location of renal neoplasm: Upper pole  Exophytic or endophytic: Encompassing majority of upper pole and mostly posterior healing [employer] more in 2022 a the junior  Renal nephrometry score: 9p   Renal artery anatomy: Single renal artery  Renal vein anatomy: Single renal vein   Contralateral renal lesions: None.  Regional lymphadenopathy: None.  Adrenal masses: None.  Renal vein/IVC involvement: No.  Metastatic disease to the abdomen: No.   Chest imaging: CTA of the chest (02/07/20) - No metastatic disease.  LFTs: Normal.   Baseline renal function: Cr 0.8, eGFR > 60 ml/min   PMH: Past medical history is significant for GERD, diabetes, hypertension, MI/CAD s/p multiple cardiac stents, hyperlipidemia, and sleep apnea. His cardiologist is Dr. Serafina Royals. He has chronic back pain and is followed by a pain clinic by Dr. Dossie Arbour.  PSH: No abdominal surgeries.     ALLERGIES: No Known Drug Allergies    MEDICATIONS: Metformin Hcl 1,000 mg tablet  Metoprolol Succinate 100 mg tablet, extended release 24 hr  Alprazolam 0.5 mg tablet  Amoxicillin-Clavulanate Potass 875 mg-125 mg tablet  Baqsimi  Cetirizine Hcl 10 mg tablet  Cyclobenzaprine Hcl 10 mg tablet  Diazepam 5 mg tablet  Diltiazem 24Hr Er (Cd) 240 mg capsule, ext release 24 hr  Enalapril Maleate 10 mg tablet  Esomeprazole Magnesium 40 mg capsule,delayed release  Ezetimibe 10 mg tablet  Fish Oil  Gabapentin 800 mg tablet  Humulin R U-500  Isosorbide Mononitrate Er 30 mg tablet, extended release 24 hr  Magnesium  Magnesium Oxide  Meclizine Hcl 25 mg tablet  Montelukast Sodium 10 mg tablet  Naloxone Hcl 0.4 mg/ml cartridge  Niacin 50 mg  tablet  Nitroglycerin 0.4 mg tablet, sublingual  Oxycodone Hcl 10 mg tablet  Prasugrel Hcl 10 mg tablet  Sodium Chloride     GU PSH: None     PSH Notes: ear surgery, shoulder surgery   NON-GU PSH: Back Surgery (Unspecified)     GU PMH: None     PMH Notes: kidney cancer   NON-GU PMH: Arrhythmia Diabetes Type 2 GERD Hypercholesterolemia Hypertension Myocardial Infarction Sleep Apnea    FAMILY HISTORY: No Family History    SOCIAL HISTORY: Marital Status: Married Preferred Language: English; Ethnicity: Not Hispanic Or Latino; Race: White Current Smoking Status: Patient has never smoked.   Tobacco Use Assessment Completed: Used Tobacco in last 30 days? Has never drank.  Drinks 1 caffeinated drink per day.    REVIEW OF SYSTEMS:    GU Review Male:   Patient reports frequent urination, get up at night to urinate, and stream starts and stops. Patient denies hard to postpone urination, burning/ pain with urination, leakage of urine, trouble starting your streams, and have to strain to urinate .  Gastrointestinal (Upper):   Patient denies nausea and vomiting.  Gastrointestinal (Lower):   Patient denies diarrhea and constipation.  Constitutional:   Patient reports fatigue. Patient denies fever, night sweats, and weight loss.  Skin:   Patient denies skin rash/ lesion and itching.  Eyes:   Patient denies blurred vision and double vision.  Ears/ Nose/ Throat:   Patient denies sore throat and sinus problems.  Hematologic/Lymphatic:   Patient denies  swollen glands and easy bruising.  Cardiovascular:   Patient reports chest pains. Patient denies leg swelling.  Respiratory:   Patient denies cough and shortness of breath.  Endocrine:   Patient denies excessive thirst.  Musculoskeletal:   Patient reports back pain and joint pain.   Neurological:   Patient reports headaches and dizziness.   Psychologic:   Patient denies depression and anxiety.   VITAL SIGNS:     Weight 202 lb /  91.63 kg  Height 69 in / 175.26 cm  BMI 29.8 kg/m   MULTI-SYSTEM PHYSICAL EXAMINATION:    Constitutional: Well-nourished. No physical deformities. Normally developed. Good grooming.  Neck: Neck symmetrical, not swollen. Normal tracheal position.  Respiratory: No labored breathing, no use of accessory muscles. Clear bilaterally.  Cardiovascular: Normal temperature, normal extremity pulses, no swelling, no varicosities. Regular rate and rhythm.  Lymphatic: No enlargement of neck, axillae, groin.  Skin: No paleness, no jaundice, no cyanosis. No lesion, no ulcer, no rash.  Neurologic / Psychiatric: Oriented to time, oriented to place, oriented to person. No depression, no anxiety, no agitation.  Gastrointestinal: Indwelling right lateral cholecystostomy tube. Otherwise, no masses and nontender.  Eyes: Normal conjunctivae. Normal eyelids.  Ears, Nose, Mouth, and Throat: Left ear no scars, no lesions, no masses. Right ear no scars, no lesions, no masses. Nose no scars, no lesions, no masses. Normal hearing. Normal lips.  Musculoskeletal: Normal gait and station of head and neck.     Complexity of Data:  Records Review:   Previous Patient Records  X-Ray Review: C.T. Abdomen/Pelvis: Reviewed Films.    Notes:                     CLINICAL DATA: 65 year old male with abdominal pain and fever.   EXAM:  CT ANGIOGRAPHY CHEST   CT ABDOMEN AND PELVIS WITH CONTRAST   TECHNIQUE:  Multidetector CT imaging of the chest was performed using the  standard protocol during bolus administration of intravenous  contrast. Multiplanar CT image reconstructions and MIPs were  obtained to evaluate the vascular anatomy. Multidetector CT imaging  of the abdomen and pelvis was performed using the standard protocol  during bolus administration of intravenous contrast.   CONTRAST: 66m OMNIPAQUE IOHEXOL 350 MG/ML SOLN   COMPARISON: Chest radiograph dated 02/07/2020 and CT of the abdomen  pelvis dated 05/07/2007.    FINDINGS:  CTA CHEST FINDINGS   Cardiovascular: Top-normal cardiac size. No pericardial effusion.  Three-vessel coronary vascular calcification. The thoracic aorta is  unremarkable. The origins of the great vessels of the aortic arch  appear patent. Evaluation of the pulmonary arteries is somewhat  limited due to respiratory motion artifact and suboptimal  visualization of the peripheral branches. No central pulmonary  artery embolus identified.   Mediastinum/Nodes: No hilar or mediastinal adenopathy. The esophagus  and the thyroid gland are grossly unremarkable. No mediastinal fluid  collection.   Lungs/Pleura: There are bibasilar subpleural atelectasis/scarring.  No focal consolidation, pleural effusion, pneumothorax. The central  airways are patent.   Musculoskeletal: T8-T9 disc spacer. No acute osseous pathology.   Review of the MIP images confirms the above findings.   CT ABDOMEN and PELVIS FINDINGS   No intra-abdominal free air or free fluid.   Hepatobiliary: Fatty liver. No intrahepatic biliary dilatation.  There is mild haziness of the gallbladder wall and pericholecystic  fat. No calcified gallstone. Ultrasound may provide better  evaluation of the gallbladder if there is clinical concern for acute  cholecystitis.   Pancreas:  Unremarkable. No pancreatic ductal dilatation or  surrounding inflammatory changes.   Spleen: Normal in size without focal abnormality.   Adrenals/Urinary Tract: The adrenal glands unremarkable. There is a  5.6 x 5.9 cm heterogeneously enhancing mass in the upper pole of the  left kidney most concerning for malignancy. Further characterization  with renal mass protocol MRI is recommended. Additional 2 cm  hypodense lesion in the upper pole of the left kidney laterally is  not characterized but possibly a cyst. There is no hydronephrosis on  either side. There is symmetric enhancement and excretion of  contrast by both kidneys. The  visualized ureters and urinary bladder  appear unremarkable.   Stomach/Bowel: There is no bowel obstruction or active inflammation.  Appendectomy.   Vascular/Lymphatic: Mild aortoiliac atherosclerotic disease. The IVC  is unremarkable. No portal venous gas. There is no adenopathy.   Reproductive: The prostate and seminal vesicles are grossly  unremarkable. No pelvic mass.   Other: None   Musculoskeletal: No acute or significant osseous findings.   Review of the MIP images confirms the above findings.   IMPRESSION:  1. No acute intrathoracic pathology. No CT evidence of central  pulmonary artery embolus.  2. Mild haziness of the gallbladder wall and pericholecystic fat. No  calcified gallstone. Ultrasound may provide better evaluation of the  gallbladder if there is clinical concern for acute cholecystitis.  3. A 5.6 x 5.9 cm heterogeneously enhancing mass in the upper pole  of the left kidney most concerning for malignancy. Further  characterization with renal mass protocol MRI is recommended.  4. Fatty liver.  5. No bowel obstruction.  6. Aortic Atherosclerosis (ICD10-I70.0).    Electronically Signed  By: Anner Crete M.D.  On: 02/07/2020 20:37   CLINICAL DATA: 65 year old male with abdominal pain and fever.   EXAM:  CT ANGIOGRAPHY CHEST   CT ABDOMEN AND PELVIS WITH CONTRAST   TECHNIQUE:  Multidetector CT imaging of the chest was performed using the  standard protocol during bolus administration of intravenous  contrast. Multiplanar CT image reconstructions and MIPs were  obtained to evaluate the vascular anatomy. Multidetector CT imaging  of the abdomen and pelvis was performed using the standard protocol  during bolus administration of intravenous contrast.   CONTRAST: 80m OMNIPAQUE IOHEXOL 350 MG/ML SOLN   COMPARISON: Chest radiograph dated 02/07/2020 and CT of the abdomen  pelvis dated 05/07/2007.   FINDINGS:  CTA CHEST FINDINGS   Cardiovascular:  Top-normal cardiac size. No pericardial effusion.  Three-vessel coronary vascular calcification. The thoracic aorta is  unremarkable. The origins of the great vessels of the aortic arch  appear patent. Evaluation of the pulmonary arteries is somewhat  limited due to respiratory motion artifact and suboptimal  visualization of the peripheral branches. No central pulmonary  artery embolus identified.   Mediastinum/Nodes: No hilar or mediastinal adenopathy. The esophagus  and the thyroid gland are grossly unremarkable. No mediastinal fluid  collection.   Lungs/Pleura: There are bibasilar subpleural atelectasis/scarring.  No focal consolidation, pleural effusion, pneumothorax. The central  airways are patent.   Musculoskeletal: T8-T9 disc spacer. No acute osseous pathology.   Review of the MIP images confirms the above findings.   CT ABDOMEN and PELVIS FINDINGS   No intra-abdominal free air or free fluid.   Hepatobiliary: Fatty liver. No intrahepatic biliary dilatation.  There is mild haziness of the gallbladder wall and pericholecystic  fat. No calcified gallstone. Ultrasound may provide better  evaluation of the gallbladder if there is clinical concern for acute  cholecystitis.   Pancreas: Unremarkable. No pancreatic ductal dilatation or  surrounding inflammatory changes.   Spleen: Normal in size without focal abnormality.   Adrenals/Urinary Tract: The adrenal glands unremarkable. There is a  5.6 x 5.9 cm heterogeneously enhancing mass in the upper pole of the  left kidney most concerning for malignancy. Further characterization  with renal mass protocol MRI is recommended. Additional 2 cm  hypodense lesion in the upper pole of the left kidney laterally is  not characterized but possibly a cyst. There is no hydronephrosis on  either side. There is symmetric enhancement and excretion of  contrast by both kidneys. The visualized ureters and urinary bladder  appear unremarkable.    Stomach/Bowel: There is no bowel obstruction or active inflammation.  Appendectomy.   Vascular/Lymphatic: Mild aortoiliac atherosclerotic disease. The IVC  is unremarkable. No portal venous gas. There is no adenopathy.   Reproductive: The prostate and seminal vesicles are grossly  unremarkable. No pelvic mass.   Other: None   Musculoskeletal: No acute or significant osseous findings.   Review of the MIP images confirms the above findings.   IMPRESSION:  1. No acute intrathoracic pathology. No CT evidence of central  pulmonary artery embolus.  2. Mild haziness of the gallbladder wall and pericholecystic fat. No  calcified gallstone. Ultrasound may provide better evaluation of the  gallbladder if there is clinical concern for acute cholecystitis.  3. A 5.6 x 5.9 cm heterogeneously enhancing mass in the upper pole  of the left kidney most concerning for malignancy. Further  characterization with renal mass protocol MRI is recommended.  4. Fatty liver.  5. No bowel obstruction.  6. Aortic Atherosclerosis (ICD10-I70.0).    Electronically Signed  By: Anner Crete M.D.  On: 02/07/2020 20:37   CLINICAL DATA: Abdominal distension. Cholecystostomy tube placement  today.   EXAM:  CT ABDOMEN AND PELVIS WITHOUT CONTRAST   TECHNIQUE:  Multidetector CT imaging of the abdomen and pelvis was performed  following the standard protocol without IV contrast.   COMPARISON: CT abdomen pelvis dated February 07, 2020.   FINDINGS:  Lower chest: No acute abnormality. Subsegmental atelectasis in both  lower lobes.   Hepatobiliary: No focal liver abnormality. Interval placement of a  transhepatic cholecystostomy tube within the gallbladder lumen. The  gallbladder remains distended and contains new high density  material. No biliary dilatation.   Pancreas: Unremarkable. No pancreatic ductal dilatation or  surrounding inflammatory changes.   Spleen: Normal in size without focal  abnormality.   Adrenals/Urinary Tract: The adrenal glands are unremarkable. 5.8 x  5.7 cm solid mass arising from the upper pole of the left kidney is  grossly unchanged but better evaluated on prior contrast enhanced  study. Unchanged 1.9 cm simple cyst in the lateral midpole of the  left kidney. No renal calculi or hydronephrosis. The bladder is  unremarkable.   Stomach/Bowel: Stomach is within normal limits. Prior appendectomy.  No evidence of bowel wall thickening, distention, or inflammatory  changes.   Vascular/Lymphatic: Aortic atherosclerosis. No enlarged abdominal or  pelvic lymph nodes.   Reproductive: Prostate is unremarkable.   Other: No free fluid or pneumoperitoneum.   Musculoskeletal: No acute or significant osseous findings.   IMPRESSION:  1. Interval placement of a transhepatic cholecystostomy tube within  the gallbladder lumen. The gallbladder remains distended with some  high density material, likely sludge and/or a small amount of  post-procedure hematoma. No evident complication or new acute  intra-abdominal process.  2. Unchanged 5.8 cm solid mass  arising from the upper pole of the  left kidney, better evaluated on prior contrast enhanced study,  consistent with renal cell carcinoma.  3. Aortic Atherosclerosis (ICD10-I70.0).    Electronically Signed  By: Titus Dubin M.D.  On: 02/09/2020 16:26   PROCEDURES: None   ASSESSMENT:      ICD-10 Details  1 GU:   Left renal neoplasm - D49.512    PLAN:           Orders Labs CBC with Diff, CMP     1. Left renal neoplasm highly suspicious for renal cell carcinoma: I had a detailed discussion with Kenneth Osborn and his wife today regarding his situation. He has a high likelihood of having renal cell carcinoma. Based on his risk factors for developing renal disease, he was referred to consider minimally invasive nephron sparing surgery. Although he does have a complex and large tumor, I do think this is  potentially amenable to nephron sparing surgery.   The patient was provided information regarding their renal mass including the relative risk of benign versus malignant pathology and the natural history of renal cell carcinoma and other possible malignancies of the kidney. The role of renal biopsy, laboratory testing, and imaging studies to further characterize renal masses and/or the presence of metastatic disease were explained. We discussed the role of active surveillance, surgical therapy with both radical nephrectomy and nephron-sparing surgery, and ablative therapy in the treatment of renal masses. In addition, we discussed our goals of providing an accurate diagnosis and oncologic control while maintaining optimal renal function as appropriate based on the size, location, and complexity of their renal mass as well as their co-morbidities. We have discussed the risks of treatment in detail including but not limited to bleeding, infection, heart attack, stroke, death, venothromoboembolism, cancer recurrence, injury/damage to surrounding organs and structures, urine leak, the possibility of open surgical conversion for patients undergoing minimally invasive surgery, the risk of developing chronic kidney disease and its associated implications, and the potential risk of end stage renal disease possibly necessitating dialysis.   He is currently on Effient due to his history of multiple cardiac stents. He also takes aspirin 81 mg. He understands that he would need to stop Effient prior to his surgery but could continue aspirin 81 mg perioperatively. He understands that this might slightly increased his risk of postoperative bleeding considering the high bleeding risk associated with this surgery but that this would be acceptable considering his cardiac risk associated with stopping all anti-platelet agents. I have recommended that we get cardiac risk assessment/clearance prior to proceeding.   We have  discussed proceeding with a left robot assisted laparoscopic partial nephrectomy although he understands the potential risk for necessitating total nephrectomy based on the complexity of his tumor. We then discussed this in the context of his need for cholecystectomy. He is interested in having these procedures done simultaneously which I think is reasonable.

## 2020-04-21 ENCOUNTER — Other Ambulatory Visit: Payer: Self-pay

## 2020-04-21 ENCOUNTER — Encounter (HOSPITAL_COMMUNITY): Payer: Self-pay | Admitting: Urology

## 2020-04-21 LAB — BASIC METABOLIC PANEL
Anion gap: 8 (ref 5–15)
BUN: 17 mg/dL (ref 8–23)
CO2: 24 mmol/L (ref 22–32)
Calcium: 8.9 mg/dL (ref 8.9–10.3)
Chloride: 102 mmol/L (ref 98–111)
Creatinine, Ser: 1.6 mg/dL — ABNORMAL HIGH (ref 0.61–1.24)
GFR, Estimated: 48 mL/min — ABNORMAL LOW (ref >60)
Glucose, Bld: 399 mg/dL — ABNORMAL HIGH (ref 70–99)
Potassium: 4.8 mmol/L (ref 3.5–5.1)
Sodium: 134 mmol/L — ABNORMAL LOW (ref 135–145)

## 2020-04-21 LAB — GLUCOSE, CAPILLARY
Glucose-Capillary: 390 mg/dL — ABNORMAL HIGH (ref 70–99)
Glucose-Capillary: 348 mg/dL — ABNORMAL HIGH (ref 70–99)
Glucose-Capillary: 329 mg/dL — ABNORMAL HIGH (ref 70–99)
Glucose-Capillary: 331 mg/dL — ABNORMAL HIGH (ref 70–99)

## 2020-04-21 LAB — HEMOGLOBIN AND HEMATOCRIT, BLOOD
HCT: 38.8 % — ABNORMAL LOW (ref 39.0–52.0)
Hemoglobin: 13 g/dL (ref 13.0–17.0)

## 2020-04-21 LAB — TRIGLYCERIDES: Triglycerides: 67 mg/dL (ref <150)

## 2020-04-21 MED ORDER — GLUCERNA 1.2 CAL PO LIQD
237.0000 mL | Freq: Three times a day (TID) | ORAL | Status: DC
  Filled 2020-04-21: qty 237

## 2020-04-21 MED ORDER — CHLORHEXIDINE GLUCONATE CLOTH 2 % EX PADS
6.0000 | MEDICATED_PAD | Freq: Every day | CUTANEOUS | Status: DC
  Administered 2020-04-21 – 2020-04-22 (×2): 6 via TOPICAL

## 2020-04-21 MED ORDER — INSULIN ASPART 100 UNIT/ML ~~LOC~~ SOLN
0.0000 [IU] | SUBCUTANEOUS | Status: DC
  Administered 2020-04-21: 15 [IU] via SUBCUTANEOUS
  Administered 2020-04-21: 20 [IU] via SUBCUTANEOUS
  Administered 2020-04-21 (×2): 15 [IU] via SUBCUTANEOUS
  Administered 2020-04-22 (×4): 11 [IU] via SUBCUTANEOUS

## 2020-04-21 MED ORDER — BISACODYL 10 MG RE SUPP
10.0000 mg | Freq: Once | RECTAL | Status: AC
  Administered 2020-04-21: 10 mg via RECTAL
  Filled 2020-04-21: qty 1

## 2020-04-21 MED ORDER — ACETAMINOPHEN 500 MG PO TABS
1000.0000 mg | ORAL_TABLET | Freq: Four times a day (QID) | ORAL | Status: DC
  Administered 2020-04-21 – 2020-04-22 (×5): 1000 mg via ORAL
  Filled 2020-04-21 (×5): qty 2

## 2020-04-21 MED ORDER — HYDROMORPHONE HCL 1 MG/ML IJ SOLN
0.5000 mg | INTRAMUSCULAR | Status: DC | PRN
  Administered 2020-04-21 – 2020-04-22 (×3): 1 mg via INTRAVENOUS
  Filled 2020-04-21 (×3): qty 1

## 2020-04-21 MED ORDER — GLUCERNA SHAKE PO LIQD
237.0000 mL | Freq: Three times a day (TID) | ORAL | Status: DC
  Administered 2020-04-21 – 2020-04-22 (×5): 237 mL via ORAL
  Filled 2020-04-21 (×6): qty 237

## 2020-04-21 MED ORDER — INSULIN GLARGINE 100 UNIT/ML ~~LOC~~ SOLN
40.0000 [IU] | Freq: Every day | SUBCUTANEOUS | Status: DC
  Administered 2020-04-21 – 2020-04-22 (×2): 40 [IU] via SUBCUTANEOUS
  Filled 2020-04-21 (×2): qty 0.4

## 2020-04-21 MED ORDER — OXYCODONE HCL 5 MG PO TABS
10.0000 mg | ORAL_TABLET | Freq: Four times a day (QID) | ORAL | Status: DC
  Administered 2020-04-21 – 2020-04-22 (×4): 10 mg via ORAL
  Filled 2020-04-21 (×4): qty 2

## 2020-04-21 NOTE — Progress Notes (Signed)
Kenneth Osborn [id number] 1955/07/29  CARE TEAM:  PCP: [name], MD  Outpatient Care Team: Patient Care Team: [name], MD as PCP - General (Family Medicine) Michael Boston, MD as Consulting Physician (General Surgery) Raynelle Bring, MD as Consulting Physician (Urology) Lonia Farber, MD as Consulting Physician (Endocrinology) Corey Skains, MD as Consulting Physician (Cardiology)  Inpatient Treatment Team: Treatment Team: Attending Provider: Raynelle Bring, MD; Physician Assistant: Debbrah Alar, PA-C; Consulting Physician: Michael Boston, MD; Registered Nurse: [place], [place], RN; Registered Nurse: Darrick Huntsman, RN   Problem List:   Principal Problem:   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 Active Problems:   Benign essential HTN   OSA (obstructive sleep apnea)   Chronic low back pain (Bilateral) (L>R) w/ sciatica (Left)   History of Meniere's disease   Diabetes mellitus, type II (HCC)   CAD (coronary artery disease)   CKD (chronic kidney disease) stage 3, GFR 30-59 ml/min (HCC)   GERD (gastroesophageal reflux disease)   Hyperlipidemia associated with type 2 diabetes mellitus ([place])   Diabetic polyneuropathy associated with diabetes mellitus due to underlying condition ([place])   Acute on chronic cholecystitis s/p robotic cholecystectomy 04/20/2020   1 Day Post-Op  04/20/2020  POST-OPERATIVE DIAGNOSIS:  Acute on Chronic Calculus Cholecystitis Fatty steatohepatitis  PROCEDURE:   ROBOTIC CHOLECYSTECTOMY ROBOTIC LYSIS OF ADHESIONS ROBOTIC LIVER WEDGE BIOPSY  SURGEON:  [name], MD, FACS.  OR FINDINGS:  Thickened inflamed gallbladder.  Very intrahepatic with fatty change of the liver.  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  Percutaneous cholecystostomy tube coming the lateral aspect of the liver and gallbladder.  Removed.  No cholangiogram done.   Assessment  FAIR  [facility] Stay = 1  days)  Plan:  -Advance diet as tolerated.  Try to improve diabetic control.  Consider taking D5 out of IV fluids and placing on Lantus.  Defer to the primary urology service.  Fluid management per primary urology service.  Pain control a challenge in this patient with chronic pain issues.  Continue Flexeril and scheduled chronic pain and anxiety/depression medicines.  Add Tylenol.  -VTE prophylaxis- SCDs, etc. okay for low-dose heparin/Lovenox for prophylaxis from my standpoint.  If hemoglobin stable 48 hours postop, can resume full anticoagulation.  Defer to main service.  -mobilize as tolerated to help recovery  Disposition:  Disposition:  The patient is from: Home  Anticipate discharge to:  Home with Home Health  Anticipated Date of Discharge is:  March 5,2022    Barriers to discharge:  Pending Clinical improvement (more likely than not)  Patient currently is NOT MEDICALLY STABLE for discharge from the hospital from a surgery standpoint.      25 minutes spent in review, evaluation, examination, counseling, and coordination of care.   I have reviewed this patient's available data, including medical history, events of note, physical examination and test results as part of my evaluation.  A significant portion of that time was spent in counseling.  Care during the described time interval was provided by me.  04/21/2020    Subjective: (Chief complaint)  Sore  Tolerating liquids.  Dr. Alinda Money in room with me.  Objective:  Vital signs:  Vitals:   04/20/20 2054 04/21/20 0122 04/21/20 0447 04/21/20 0600  BP: (!) 157/86 (!) 148/81 (!) 149/72   Pulse: 91 (!) 103 (!) 109   Resp: 20  20   Temp: 98.4 F (36.9 C) 98.8 F (37.1 C) 98.8 F (37.1 C)   TempSrc: Oral Oral Oral  SpO2: 100% 97% 97%   Weight: 97.5 kg   98.2 kg  Height: 5' 9"  (1.753 m)       Last BM Date: 04/20/20  Intake/Output   Yesterday:  03/03 0701 - 03/04 0700 In: 4127.6 [I.V.:3477.6; IV  Piggyback:650] Out: 5462 [VOJJK:0938; Blood:400] This shift:  Total I/O In: 800 [I.V.:650; IV Piggyback:150] Out: 1425 [Urine:1425]  Bowel function:  Flatus: YES  BM:  No  Drain: (No drain)   Physical Exam:  General: Pt mildly groggy at first but then awakens to be alert in no acute distress Eyes: PERRL, normal EOM.  Sclera clear.  No icterus Neuro: CN II-XII intact w/o focal sensory/motor deficits. Lymph: No head/neck/groin lymphadenopathy Psych:  No delerium/psychosis/paranoia.  Oriented x 4. HENT: Normocephalic, Mucus membranes moist.  No thrush Neck: Supple, No tracheal deviation.  No obvious thyromegaly Chest: No pain to chest wall compression.  Good respiratory excursion.  No audible wheezing CV:  Pulses intact.  Regular rhythm.  No major extremity edema MS: Normal AROM mjr joints.  No obvious deformity  Abdomen: Soft.  Mildy distended.  Mildly tender at incisions only.  No evidence of peritonitis.  No incarcerated hernias.  Ext:  No deformity.  No mjr edema.  No cyanosis Skin: No petechiae / purpurea.  No major sores.  Warm and dry    Results:   Cultures: Recent Results (from the past 720 hour(s))  SARS CORONAVIRUS 2 (TAT 6-24 HRS) Nasopharyngeal Nasopharyngeal Swab     Status: None   Collection Time: 04/17/20 10:52 AM   Specimen: Nasopharyngeal Swab  Result Value Ref Range Status   SARS Coronavirus 2 NEGATIVE NEGATIVE Final    Comment: (NOTE) SARS-CoV-2 target nucleic acids are NOT DETECTED.  The SARS-CoV-2 RNA is generally detectable in upper and lower respiratory specimens during the acute phase of infection. Negative results do not preclude SARS-CoV-2 infection, do not rule out co-infections with other pathogens, and should not be used as the sole basis for treatment or other patient management decisions. Negative results must be combined with clinical observations, patient history, and epidemiological information. The expected result is  Negative.  Fact Sheet for Patients: SugarRoll.be  Fact Sheet for Healthcare Providers: https://www.woods-mathews.com/  This test is not yet approved or cleared by the [place] FDA and  has been authorized for detection and/or diagnosis of SARS-CoV-2 by FDA under an Emergency Use Authorization (EUA). This EUA will remain  in effect (meaning this test can be used) for the duration of the COVID-19 declaration under Se ction 564(b)(1) of the Act, 21 U.S.C. section 360bbb-3(b)(1), unless the authorization is terminated or revoked sooner.  Performed at [facility], [place] [address] [place], [place] [address]     Labs: Results for orders placed or performed during the hospital encounter of 04/20/20 (from the past 48 hour(s))  Glucose, capillary     Status: Abnormal   Collection Time: 04/20/20 10:00 AM  Result Value Ref Range   Glucose-Capillary 216 (H) 70 - 99 mg/dL    Comment: Glucose reference range applies only to samples taken after fasting for at least 8 hours.   Comment 1 Notify RN    Comment 2 Document in Chart   Type and screen [place]     Status: None   Collection Time: 04/20/20 10:15 AM  Result Value Ref Range   ABO/RH(D) O POS    Antibody Screen NEG    Sample Expiration      04/23/2020,2359 Performed at [facility]  [place] [address], [place], [place] [address]   I-STAT 7, (LYTES, BLD GAS, ICA, H+H)     Status: Abnormal   Collection Time: 04/20/20  1:10 PM  Result Value Ref Range   pH, Arterial 7.374 7.350 - 7.450   pCO2 arterial 48.3 (H) 32.0 - 48.0 mmHg   pO2, Arterial 331 (H) 83.0 - 108.0 mmHg   Bicarbonate 28.6 (H) 20.0 - 28.0 mmol/L   TCO2 30 22 - 32 mmol/L   O2 Saturation 100.0 %   Acid-Base Excess 2.0 0.0 - 2.0 mmol/L   Sodium 141 135 - 145 mmol/L   Potassium 3.9 3.5 - 5.1 mmol/L   Calcium, Ion 1.30 1.15 - 1.40 mmol/L   HCT 39.0 39.0 - 52.0 %   Hemoglobin 13.3  13.0 - 17.0 g/dL   Patient temperature 35.8 C    Sample type ARTERIAL   I-STAT 7, (LYTES, BLD GAS, ICA, H+H)     Status: Abnormal   Collection Time: 04/20/20  3:32 PM  Result Value Ref Range   pH, Arterial 7.371 7.350 - 7.450   pCO2 arterial 41.3 32.0 - 48.0 mmHg   pO2, Arterial 126 (H) 83.0 - 108.0 mmHg   Bicarbonate 24.1 20.0 - 28.0 mmol/L   TCO2 25 22 - 32 mmol/L   O2 Saturation 99.0 %   Acid-base deficit 1.0 0.0 - 2.0 mmol/L   Sodium 141 135 - 145 mmol/L   Potassium 3.9 3.5 - 5.1 mmol/L   Calcium, Ion 1.28 1.15 - 1.40 mmol/L   HCT 39.0 39.0 - 52.0 %   Hemoglobin 13.3 13.0 - 17.0 g/dL   Patient temperature 36.4 C    Sample type ARTERIAL   Glucose, capillary     Status: Abnormal   Collection Time: 04/20/20  6:11 PM  Result Value Ref Range   Glucose-Capillary 301 (H) 70 - 99 mg/dL    Comment: Glucose reference range applies only to samples taken after fasting for at least 8 hours.   Comment 1 Document in Chart    Comment 2 Call MD NNP PA CNM   Basic metabolic panel     Status: Abnormal   Collection Time: 04/20/20  6:12 PM  Result Value Ref Range   Sodium 138 135 - 145 mmol/L   Potassium 4.1 3.5 - 5.1 mmol/L   Chloride 104 98 - 111 mmol/L   CO2 21 (L) 22 - 32 mmol/L   Glucose, Bld 309 (H) 70 - 99 mg/dL    Comment: Glucose reference range applies only to samples taken after fasting for at least 8 hours.   BUN 12 8 - 23 mg/dL   Creatinine, Ser 1.16 0.61 - 1.24 mg/dL   Calcium 9.0 8.9 - 10.3 mg/dL   GFR, Estimated >60 >60 mL/min    Comment: (NOTE) Calculated using the CKD-EPI Creatinine Equation (2021)    Anion gap 13 5 - 15    Comment: Performed at [facility], [place] [address] [place], [place] [address]  Hemoglobin and hematocrit, blood     Status: None   Collection Time: 04/20/20  6:12 PM  Result Value Ref Range   Hemoglobin 13.3 13.0 - 17.0 g/dL   HCT 40.6 39.0 - 52.0 %    Comment: Performed at [facility], [place] [address] [place], [place] [address]  Glucose, capillary     Status: Abnormal   Collection Time: 04/20/20  7:16 PM  Result Value Ref Range   Glucose-Capillary 276 (H)  70 - 99 mg/dL    Comment: Glucose reference range applies only to samples taken after fasting for at least 8 hours.  Glucose, capillary     Status: Abnormal   Collection Time: 04/20/20  8:24 PM  Result Value Ref Range   Glucose-Capillary 291 (H) 70 - 99 mg/dL    Comment: Glucose reference range applies only to samples taken after fasting for at least 8 hours.  Glucose, capillary     Status: Abnormal   Collection Time: 04/20/20  8:57 PM  Result Value Ref Range   Glucose-Capillary 270 (H) 70 - 99 mg/dL    Comment: Glucose reference range applies only to samples taken after fasting for at least 8 hours.  Hemoglobin A1c     Status: Abnormal   Collection Time: 04/20/20  9:08 PM  Result Value Ref Range   Hgb A1c MFr Bld 7.2 (H) 4.8 - 5.6 %    Comment: (NOTE) Pre diabetes:          5.7%-6.4%  Diabetes:              >6.4%  Glycemic control for   <7.0% adults with diabetes    Mean Plasma Glucose 159.94 mg/dL    Comment: Performed at [place] [address] [place], [place] [address]  Basic metabolic panel     Status: Abnormal   Collection Time: 04/21/20  4:03 AM  Result Value Ref Range   Sodium 134 (L) 135 - 145 mmol/L   Potassium 4.8 3.5 - 5.1 mmol/L   Chloride 102 98 - 111 mmol/L   CO2 24 22 - 32 mmol/L   Glucose, Bld 399 (H) 70 - 99 mg/dL    Comment: Glucose reference range applies only to samples taken after fasting for at least 8 hours.   BUN 17 8 - 23 mg/dL   Creatinine, Ser 1.60 (H) 0.61 - 1.24 mg/dL   Calcium 8.9 8.9 - 10.3 mg/dL   GFR, Estimated 48 (L) >60 mL/min    Comment: (NOTE) Calculated using the CKD-EPI Creatinine Equation (2021)    Anion gap 8 5 - 15    Comment: Performed at [facility], [place] [address], [place], [place] [address]  Hemoglobin and hematocrit, blood     Status:  Abnormal   Collection Time: 04/21/20  4:03 AM  Result Value Ref Range   Hemoglobin 13.0 13.0 - 17.0 g/dL   HCT 38.8 (L) 39.0 - 52.0 %    Comment: Performed at [facility], [place] [address] [place], [place] [address]  Triglycerides     Status: None   Collection Time: 04/21/20  4:03 AM  Result Value Ref Range   Triglycerides 67 <150 mg/dL    Comment: Performed at [facility], [place] [address] [place], [place] [address]    Imaging / Studies: No results found.  Medications / Allergies: per chart  Antibiotics: Anti-infectives (From admission, onward)   Start     Dose/Rate Route Frequency Ordered Stop   04/21/20 0000  ceFAZolin (ANCEF) IVPB 1 g/50 mL premix        1 g 100 mL/hr over 30 Minutes Intravenous Every 8 hours 04/20/20 2058 04/21/20 1559   04/20/20 1556  metroNIDAZOLE (FLAGYL) 5-0.79 MG/ML-% IVPB       Note to Pharmacy: Nash Dimmer   : cabinet override      04/20/20 1556 04/20/20 1604   04/20/20 1556  ceFAZolin (ANCEF) 2-4 GM/100ML-% IVPB  Note to Pharmacy: Nash Dimmer   : cabinet override      04/20/20 1556 04/20/20 1600   04/20/20 1000  ceFAZolin (ANCEF) IVPB 2g/100 mL premix        2 g 200 mL/hr over 30 Minutes Intravenous  Once 04/20/20 0722 04/20/20 1232        Note: Portions of this report may have been transcribed using voice recognition software. Every effort was made to ensure accuracy; however, inadvertent computerized transcription errors may be present.   Any transcriptional errors that result from this process are unintentional.    [name], MD, FACS, MASCRS  Gastrointestinal and Minimally Invasive Surgery  [facility] [address] [place], [place] [address] [phone] Fax [phone] Main/Paging  CONTACT INFORMATION: Weekday (9AM-5PM) concerns: Call [facility] main office at [phone] Weeknight (5PM-9AM) or Weekend/Holiday concerns: Check [email or web address] for  General Surgery [facility] coverage (Please, do not use SecureChat as it is not reliable communication to operating surgeons for immediate patient care)      04/21/2020  6:24 AM

## 2020-04-21 NOTE — Progress Notes (Addendum)
Patient ID: Kenneth Osborn, male   DOB: 1955/09/05, 65 y.o.   MRN: [id number]  1 Day Post-Op Subjective: Pt has not yet ambulated or been out of bed.  Does not feel incisional pain is controlled despite current regimen of oral and IV narcotics, gabapentin, robaxin, Tylenol, and local anesthesia. However, he has not been taking oral pain meds which were ordered prn.  Does not have incentive spirometer.  No nausea or vomiting.  Tolerating some clears.  Glucose levels in high 200s.  No chest pain.  Objective: Vital signs in last 24 hours: Temp:  [97.7 F (36.5 C)-98.8 F (37.1 C)] 98.8 F (37.1 C) (03/04 0447) Pulse Rate:  [83-109] 109 (03/04 0447) Resp:  [14-31] 20 (03/04 0447) BP: (148-168)/(72-91) 149/72 (03/04 0447) SpO2:  [94 %-100 %] 97 % (03/04 0447) Arterial Line BP: (162-183)/(55-71) 180/71 (03/03 1940) Weight:  [97.5 kg-98.2 kg] 98.2 kg (03/04 0600)  Intake/Output from previous day: 03/03 0701 - 03/04 0700 In: 4127.6 [I.V.:3477.6; IV Piggyback:650] Out: 2585 [Urine:2185; Blood:400] Intake/Output this shift: No intake/output data recorded.  Physical Exam:  General: Alert and oriented CV: RRR Lungs: Clear Abdomen: Soft, ND, positive BS Incisions: C/D/I Ext: NT, No erythema  Lab Results: Recent Labs    04/20/20 1532 04/20/20 1812 04/21/20 0403  HGB 13.3 13.3 13.0  HCT 39.0 40.6 38.8*   BMET Recent Labs    04/20/20 1812 04/21/20 0403  NA 138 134*  K 4.1 4.8  CL 104 102  CO2 21* 24  GLUCOSE 309* 399*  BUN 12 17  CREATININE 1.16 1.60*  CALCIUM 9.0 8.9     Studies/Results: Pathology pending  Assessment/Plan: POD # 1 s/p left robotic radical nephrectomy and cholecystectomy/liver biopsy - Ambulate, IS, DVT prophylaxis, will order PT to evaluate today - Advance diet as tolerated and will add nutritional supplement with Glucerna - Will adjust SSI and continue until taking better po intake - D/C Foley - Monitor renal function, Hgb - Pathology pending -  On telemetry right now, ASA 81 mg (h/p CAD s/p cardiac stent). Will consider d/c telemetry later (ok for him to temporarily be off to walk or when with PT) and will consider restarting Effient after a minimum of 48 hrs. - Adjust pain medication for oral pain meds to be given q 6 hrs right now and will use IV pain meds for breakthrough   LOS: 1 day   Kenneth Osborn 04/21/2020, 7:28 AM

## 2020-04-21 NOTE — TOC Progression Note (Signed)
Transition of Care [facility]) - Progression Note    Patient Details  Name: Kenneth Osborn MRN: [id number] Date of Birth: 1955/10/03  Transition of Care [facility]) CM/SW Contact  Purcell Mouton, RN Phone Number: 04/21/2020, 4:13 PM  Clinical Narrative:      Spoke with pt, who states he would not know until tomorrow if he will need Home Health or any equipment. PT to eval. TOC will continue to follow for discharge needs.   Expected Discharge Plan: Home/Self Care Barriers to Discharge: No Barriers Identified  Expected Discharge Plan and Services Expected Discharge Plan: Home/Self Care       Living arrangements for the past 2 months: Single Family Home                                       Social Determinants of Health (SDOH) Interventions    Readmission Risk Interventions No flowsheet data found.

## 2020-04-21 NOTE — Progress Notes (Signed)
Patient spilled glucerna, complete bed bath given and all linens changed.  Ice pack to abdominal incisions.

## 2020-04-21 NOTE — Plan of Care (Signed)

## 2020-04-21 NOTE — Progress Notes (Signed)
Patient ambulated from bathroom to bed with one person assist.  C/O abdominal pain 8/10 and medicated with dilaudid 43m IV.  Incentive spirometer used reaching 7582mx 10 breaths.  All incisions to abdomen CDI with surgical glue.  Patient ambulated around 4w unit x 1 with walker and assist, gait unsteady constant reminders to stand up and don't push walker too far ahead.  Put back to bed after walk and SCDs in place.  Will continue to monitor.

## 2020-04-21 NOTE — Progress Notes (Signed)
Inpatient Diabetes Program Recommendations  AACE/ADA: New Consensus Statement on Inpatient Glycemic Control (2015)  Target Ranges:  Prepandial:   less than 140 mg/dL      Peak postprandial:   less than 180 mg/dL (1-2 hours)      Critically ill patients:  140 - 180 mg/dL   Lab Results  Component Value Date   GLUCAP 348 (H) 04/21/2020   HGBA1C 7.2 (H) 04/20/2020    Review of Glycemic Control Results for GRAFTON, WARZECHA" (MRN [id number]) as of 04/21/2020 11:56  Ref. Range 04/20/2020 20:24 04/20/2020 20:57 04/21/2020 09:08 04/21/2020 11:04  Glucose-Capillary Latest Ref Range: 70 - 99 mg/dL 291 (H) 270 (H) 390 (H) 348 (H)   Diabetes history: Type 2 DM Outpatient Diabetes medications: U-500 75/75/65, Metformin 1000 mg BID Current orders for Inpatient glycemic control: Novolog 0-20 units Q4H Decadron 4 mg x 1 3/3  Inpatient Diabetes Program Recommendations:    Consider adding Lantus 40 units QD (to start now) while patient is NPO.  Thanks, Bronson Curb, MSN, RNC-OB Diabetes Coordinator [phone] (8a-5p)

## 2020-04-22 LAB — BASIC METABOLIC PANEL
Anion gap: 4 — ABNORMAL LOW (ref 5–15)
BUN: 20 mg/dL (ref 8–23)
CO2: 30 mmol/L (ref 22–32)
Calcium: 9.1 mg/dL (ref 8.9–10.3)
Chloride: 100 mmol/L (ref 98–111)
Creatinine, Ser: 1.94 mg/dL — ABNORMAL HIGH (ref 0.61–1.24)
GFR, Estimated: 38 mL/min — ABNORMAL LOW (ref >60)
Glucose, Bld: 315 mg/dL — ABNORMAL HIGH (ref 70–99)
Potassium: 4.6 mmol/L (ref 3.5–5.1)
Sodium: 134 mmol/L — ABNORMAL LOW (ref 135–145)

## 2020-04-22 LAB — HEMOGLOBIN AND HEMATOCRIT, BLOOD
HCT: 36.6 % — ABNORMAL LOW (ref 39.0–52.0)
Hemoglobin: 11.6 g/dL — ABNORMAL LOW (ref 13.0–17.0)

## 2020-04-22 LAB — GLUCOSE, CAPILLARY
Glucose-Capillary: 251 mg/dL — ABNORMAL HIGH (ref 70–99)
Glucose-Capillary: 275 mg/dL — ABNORMAL HIGH (ref 70–99)
Glucose-Capillary: 285 mg/dL — ABNORMAL HIGH (ref 70–99)
Glucose-Capillary: 295 mg/dL — ABNORMAL HIGH (ref 70–99)

## 2020-04-22 MED ORDER — OXYCODONE HCL 5 MG PO TABS: 10.0000 mg | ORAL_TABLET | Freq: Four times a day (QID) | ORAL | Status: DC | PRN

## 2020-04-22 NOTE — Progress Notes (Signed)
Patient attempted to use urinal and spilled it, ambulated to bathroom and bath given, complete linen change and gown change done. Patient was embarrassed he had an accident so a condom catheter was placed for comfort.  Medicated with Dilaudid 61m IV for abdominal pain 4/10 and ambulated in hallway 185fwith walker and SBA then back to room.  Ice pack reapplied to abdomen and SCDs back on.  Will continue to monitor.

## 2020-04-22 NOTE — Discharge Summary (Signed)
Date of admission: 04/20/2020  Date of discharge: 04/22/2020  Admission diagnosis: Left renal neoplasm, cholecystitis  Discharge diagnosis: Left renal neoplasm, cholecystitis  Secondary diagnoses: Coronary artery disease, hypertension, diabetes, chronic pain  History and Physical: For full details, please see admission history and physical. Briefly, Kenneth Osborn is a 65 y.o. year old patient who presented with acute cholecystitis in December requiring percutaneous cholecystotomy.  Incidentally, he was noted to have a 6 cm left renal mass concerning for renal malignancy.  After resolution of his acute infection, he presented for definitive evaluation of each problem.  Hospital Course: He was taken to the operating room on 04/20/2020 and underwent a left robot-assisted laparoscopic radical nephrectomy.  Under the same anesthetic, he also underwent a robot-assisted laparoscopic cholecystectomy and liver biopsy by Dr. Neysa Bonito.  He tolerated his procedure well without complications.  He did have some difficulty with pain control initially postoperatively considering his chronic pain and chronic narcotic use.  However, this was able to be well controlled over 48 hours.  He was able to begin ambulating which he did relatively well.  However, it was recommended that he continue home health physical therapy due to some instability issues.  His diet was advanced and he was tolerating a regular diet prior to discharge.  His renal function did worsen as expected status post nephrectomy.  He remained hemodynamically stable.  Although he did have some slight drop in his oxygen saturations when asleep, his oxygen levels were noted to be normal when awake and alert.  He was felt to be stable for discharge home on postoperative day 2.  He was instructed to continue his aspirin 81 mg and to begin his Effient beginning tomorrow.  Laboratory values: Recent Labs    04/20/20 1812 04/21/20 0403 04/22/20 0409  HGB  13.3 13.0 11.6*  HCT 40.6 38.8* 36.6*   Recent Labs    04/21/20 0403 04/22/20 0409  CREATININE 1.60* 1.94*    Disposition: Home  Discharge instruction: The patient was instructed to be ambulatory but told to refrain from heavy lifting, strenuous activity, or driving.   Discharge medications:  Allergies as of 04/22/2020   No Known Allergies     Medication List    STOP taking these medications   Fish Oil 1200 MG Caps     TAKE these medications   ALPRAZolam 0.5 MG tablet Commonly known as: XANAX Take 0.5 mg by mouth at bedtime.   aspirin EC 81 MG tablet Take 81 mg by mouth at bedtime. Swallow whole.   BAQSIMI ONE PACK NA Place 1 Dose into the nose daily as needed (severely low blood sugar).   cetirizine 10 MG tablet Commonly known as: ZYRTEC Take 10 mg by mouth daily.   cyclobenzaprine 10 MG tablet Commonly known as: FLEXERIL Take 10 mg by mouth 3 (three) times daily as needed for muscle spasms.   diazepam 5 MG tablet Commonly known as: VALIUM Take 5 mg by mouth See admin instructions. Take 5 mg in the morning and additional if needed for Meniere's disease up to three a day   diltiazem 240 MG 24 hr capsule Commonly known as: CARDIZEM CD Take 240 mg by mouth daily.   docusate sodium 100 MG capsule Commonly known as: COLACE Take 100 mg by mouth daily as needed for mild constipation.   enalapril 10 MG tablet Commonly known as: VASOTEC Take 10 mg by mouth 2 (two) times daily.   esomeprazole 40 MG capsule Commonly known as: NEXIUM Take  40 mg by mouth 2 (two) times daily.   ezetimibe 10 MG tablet Commonly known as: ZETIA Take 10 mg by mouth daily.   gabapentin 800 MG tablet Commonly known as: NEURONTIN Take 1,600 mg by mouth 2 (two) times daily.   HumuLIN R U-500 KwikPen 500 UNIT/ML kwikpen Generic drug: insulin regular human CONCENTRATED Inject 40 Units into the skin 3 (three) times daily with meals. What changed:   how much to take  when to take  this  additional instructions   hydrocortisone 2.5 % cream Apply 1 application topically daily as needed for hemorrhoids.   isosorbide mononitrate 30 MG 24 hr tablet Commonly known as: IMDUR Take 30 mg by mouth at bedtime.   Magnesium Oxide 500 MG Tabs Take 500 mg by mouth daily.   meclizine 25 MG tablet Commonly known as: ANTIVERT Take 25 mg by mouth See admin instructions. Take 25 mg in the morning, and additional 25 mg if needed up to a total of three per day for Meniere's disease   metFORMIN 1000 MG tablet Commonly known as: GLUCOPHAGE Take 1,000 mg by mouth 2 (two) times daily with a meal.   metoprolol succinate 100 MG 24 hr tablet Commonly known as: TOPROL-XL Take 100 mg by mouth 2 (two) times daily. Take with or immediately following a meal.   montelukast 10 MG tablet Commonly known as: SINGULAIR Take 10 mg by mouth at bedtime.   niacin 500 MG CR tablet Commonly known as: NIASPAN Take 500 mg by mouth at bedtime.   Oxycodone HCl 10 MG Tabs Take 1 tablet (10 mg total) by mouth every 6 (six) hours as needed. Must last 30 days   Oxycodone HCl 10 MG Tabs Take 1 tablet (10 mg total) by mouth every 6 (six) hours as needed. Must last 30 days Start taking on: April 24, 2020   Oxycodone HCl 10 MG Tabs Take 1 tablet (10 mg total) by mouth every 6 (six) hours as needed. Must last 30 days Start taking on: May 24, 2020   prasugrel 10 MG Tabs tablet Commonly known as: EFFIENT Take 10 mg by mouth daily.   rosuvastatin 40 MG tablet Commonly known as: CRESTOR Take 40 mg by mouth at bedtime.   sodium chloride 0.9 % injection Flush the drain daily with 5 ml       Followup:   Follow-up Information    Raynelle Bring, MD Follow up on 05/16/2020.   Specialty: Urology Why: at 10:30 Contact information: [address] [place] [id number] [phone]

## 2020-04-22 NOTE — Progress Notes (Signed)
Patient medicated with tylenol and oxycodone per orders and ambulated around 4west (351f) with walker and SBA, gait improved from last walk but still a little unsteady and back to bed.  Used incentive spirometer reaching [id number] 10 breaths.  SCDs reapplied and ice to abdomen.  Will continue to monitor.

## 2020-04-22 NOTE — Progress Notes (Signed)
OT Cancellation Note  Patient Details Name: JUNE VACHA MRN: [id number] DOB: 24-Sep-1955   Cancelled Treatment:    Reason Eval/Treat Not Completed: Other (comment). Pt currently working with PT. Plan to reattempt.  Tyrone Schimke, OT Acute Rehabilitation Services Pager: [phone] Office: [phone]  04/22/2020, 10:24 AM

## 2020-04-22 NOTE — Progress Notes (Signed)
2 Days Post-Op   Subjective/Chief Complaint: Comfortable this morning Tolerating po   Objective: Vital signs in last 24 hours: Temp:  [98.3 F (36.8 C)-99.3 F (37.4 C)] 99.3 F (37.4 C) (03/05 0643) Pulse Rate:  [82-100] 89 (03/05 0826) Resp:  [18-20] 20 (03/05 0826) BP: (103-135)/(60-71) 116/61 (03/05 0826) SpO2:  [94 %-98 %] 94 % (03/05 0826) Last BM Date: 04/21/20  Intake/Output from previous day: 03/04 0701 - 03/05 0700 In: 1792.7 [P.O.:960; I.V.:832.7] Out: 2050 [Urine:2050] Intake/Output this shift: Total I/O In: 135.2 [I.V.:135.2] Out: -   Exam: Awake and alert Abdomen soft, incisions clean  Lab Results:  Recent Labs    04/21/20 0403 04/22/20 0409  HGB 13.0 11.6*  HCT 38.8* 36.6*   BMET Recent Labs    04/21/20 0403 04/22/20 0409  NA 134* 134*  K 4.8 4.6  CL 102 100  CO2 24 30  GLUCOSE 399* 315*  BUN 17 20  CREATININE 1.60* 1.94*  CALCIUM 8.9 9.1   PT/INR No results for input(s): LABPROT, INR in the last 72 hours. ABG Recent Labs    04/20/20 1310 04/20/20 1532  PHART 7.374 7.371  HCO3 28.6* 24.1    Studies/Results: No results found.  Anti-infectives: Anti-infectives (From admission, onward)   Start     Dose/Rate Route Frequency Ordered Stop   04/21/20 0000  ceFAZolin (ANCEF) IVPB 1 g/50 mL premix        1 g 100 mL/hr over 30 Minutes Intravenous Every 8 hours 04/20/20 2058 04/22/20 0949   04/20/20 1556  metroNIDAZOLE (FLAGYL) 5-0.79 MG/ML-% IVPB       Note to Pharmacy: Nash Dimmer   : cabinet override      04/20/20 1556 04/20/20 1604   04/20/20 1556  ceFAZolin (ANCEF) 2-4 GM/100ML-% IVPB       Note to Pharmacy: Nash Dimmer   : cabinet override      04/20/20 1556 04/20/20 1600   04/20/20 1000  ceFAZolin (ANCEF) IVPB 2g/100 mL premix        2 g 200 mL/hr over 30 Minutes Intravenous  Once 04/20/20 0955 04/20/20 1232      Assessment/Plan: s/p Procedure(s): XI ROBOTIC ASSITED LAPAROSCOPIC NEPHRECTOMY (Left) XI ROBOTIC  ASSISTED LAPAROSCOPIC CHOLECYSTECTOMY WITH WEDGE RESECTION OF LIVER (N/A)  Stable from a general surgery standpoint from the cholecystectomy Home per Urology  LOS: 2 days    Coralie Keens MD 04/22/2020

## 2020-04-22 NOTE — Evaluation (Signed)
Occupational Therapy Evaluation Patient Details Name: Kenneth Osborn MRN: [id number] DOB: [birth year] Today's Date: 04/22/2020    History of Present Illness Pt is 65 yo male admitted on 04/20/20 for surgery for kidney neoplasm and chronic cholecystitis.  Pt s/p robotic assisted laparoscopic radical nephrectomy and adrenalectomy,  ROBOTIC CHOLECYSTECTOMY, ROBOTIC LYSIS OF ADHESIONS, ROBOTIC LIVER WEDGE BIOPSY all on 04/20/20. He has extensive medical hx including but not limited to low back pain, neuropathy, DM, mild cognitive impairment, MI, Meniere's disease, post concusive HA, wife reports coma in 12/21 (noted admitted with severe sepsis at that time).   Clinical Impression   Pt admitted with the above diagnoses and presents with below problem list. Pt will benefit from continued acute OT to address the below listed deficits and maximize independence with basic ADLs prior to d/c home with spouse. PTA pt was mod I to independent (supervision level?) with ADLs. Pt currently requires up to min guard assist with LB ADLs and functional transfers/mobility. Pt presents with impaired cognition (spouse reports some baseline cognitive deficits but currently worse than baseline), impulsivity, generalized weakness, and impaired balance impacting ADLs. Plan to follow acutely. Pt lives with his spouse, has 24 hr supervision/assist available.      Follow Up Recommendations  Home health OT;Supervision/Assistance - 24 hour    Equipment Recommendations  None recommended by OT    Recommendations for Other Services       Precautions / Restrictions Precautions Precautions: Fall Restrictions Weight Bearing Restrictions: No      Mobility Bed Mobility Overal bed mobility: Needs Assistance Bed Mobility: Supine to Sit     Supine to sit: Min guard;Min assist;HOB elevated     General bed mobility comments: light assist to steady    Transfers Overall transfer level: Needs assistance Equipment used:  Rolling walker (2 wheeled) Transfers: Sit to/from Stand Sit to Stand: Min guard         General transfer comment: min guard for safety. to/from EOB and recliner. Therapist stabilized rw for pt to pull up on with both hands as pt has a rollator at home.    Balance Overall balance assessment: Needs assistance Sitting-balance support: No upper extremity supported Sitting balance-Leahy Scale: Fair Sitting balance - Comments: Pt leans to L.  Wife reports this is baseline   Standing balance support: Bilateral upper extremity supported Standing balance-Leahy Scale: Poor Standing balance comment: BUE in static standing. Leans forward with static standing. gains forward and left momentum while walking.                           ADL either performed or assessed with clinical judgement   ADL Overall ADL's : Needs assistance/impaired Eating/Feeding: Set up;Sitting   Grooming: Set up;Sitting   Upper Body Bathing: Set up;Sitting;Supervision/ safety   Lower Body Bathing: Min guard;Sit to/from stand   Upper Body Dressing : Set up;Supervision/safety;Sitting   Lower Body Dressing: Min guard;Sit to/from stand   Toilet Transfer: Min guard;Ambulation;Comfort height toilet;Grab bars;RW   Toileting- Clothing Manipulation and Hygiene: Supervision/safety;Min guard;Sitting/lateral lean;Sit to/from stand   Tub/ Shower Transfer: Walk-in shower;Min guard;Ambulation;Shower seat;Rolling walker   Functional mobility during ADLs: Min guard;Rolling walker General ADL Comments: Pt needs multimodal cueing at times for safety with OOB activity 2/2 impaired cognition + impulsive     Vision Baseline Vision/History: Wears glasses       Perception     Praxis      Pertinent Vitals/Pain Pain Assessment: Faces Faces Pain  Scale: Hurts little more Pain Location: Stomach from incisions Pain Descriptors / Indicators: Sore Pain Intervention(s): Limited activity within patient's  tolerance;Monitored during session;Repositioned     Hand Dominance Right   Extremity/Trunk Assessment Upper Extremity Assessment Upper Extremity Assessment: Generalized weakness   Lower Extremity Assessment Lower Extremity Assessment: Defer to PT evaluation   Cervical / Trunk Assessment Cervical / Trunk Assessment: Kyphotic   Communication Communication Communication: No difficulties   Cognition Arousal/Alertness: Lethargic;Suspect due to medications Behavior During Therapy: Impulsive Overall Cognitive Status: Impaired/Different from baseline Area of Impairment: Awareness;Orientation;Problem solving;Safety/judgement;Following commands;Attention                 Orientation Level: Disoriented to;Time Current Attention Level: Selective;Sustained   Following Commands: Follows one step commands inconsistently Safety/Judgement: Decreased awareness of safety;Decreased awareness of deficits Awareness: Emergent Problem Solving: Requires verbal cues;Requires tactile cues General Comments: Impulsive + decreased awareness of deficits and safety awareness. Spouse reports some mild cognition deficits at baseline. She reports pt is not currently at his baseline.   General Comments  Spouse present throughout session. She provided PLOF data as pt was lethargic during session. Pt on 1L O2 during session. Noted to have desaturated with PT while walking in the halls earlier on RA.    Exercises     Shoulder Instructions      Home Living Family/patient expects to be discharged to:: Private residence Living Arrangements: Spouse/significant other Available Help at Discharge: Family;Available 24 hours/day Type of Home: House Home Access: Level entry     Home Layout: Two level Alternate Level Stairs-Number of Steps: pt reports having stair lift at home and bedrooms are upstairs   Bathroom Shower/Tub: Occupational psychologist: Standard     Home Equipment: Cane - single  point;Walker - 2 wheels;Bedside commode;Shower seat;Grab bars - tub/shower   Additional Comments: reports BSC too large      Prior Functioning/Environment Level of Independence: Needs assistance  Gait / Transfers Assistance Needed: Occasionally walks without AD; uses cane and walker or furniture surfs 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); can ambulate in community ADL's / Homemaking Assistance Needed: Independent with ADLs or if bad day wife assist; typically stands for showers but has chair if needed   Comments: No recent falls        OT Problem List: Decreased strength;Decreased activity tolerance;Impaired balance (sitting and/or standing);Decreased cognition;Decreased safety awareness;Decreased knowledge of use of DME or AE;Decreased knowledge of precautions;Pain      OT Treatment/Interventions: Self-care/ADL training;Therapeutic exercise;Energy conservation;DME and/or AE instruction;Therapeutic activities;Patient/family education;Balance training    OT Goals(Current goals can be found in the care plan section) Acute Rehab OT Goals Patient Stated Goal: return home ASAP OT Goal Formulation: With patient/family Time For Goal Achievement: 05/06/20 Potential to Achieve Goals: Good ADL Goals Pt Will Perform Grooming: sitting;with modified independence Pt Will Perform Lower Body Bathing: with modified independence;sit to/from stand Pt Will Perform Lower Body Dressing: with modified independence;sit to/from stand Pt Will Transfer to Toilet: with modified independence;ambulating Pt Will Perform Tub/Shower Transfer: Shower transfer;with min guard assist;ambulating;rolling walker;shower seat Pt/caregiver will Perform Home Exercise Program: Increased strength;Both right and left upper extremity;With written HEP provided;With Supervision  OT Frequency: Min 2X/week   Barriers to D/C:            Co-evaluation              AM-PAC OT "6 Clicks" Daily Activity     Outcome  Measure Help from another person eating meals?: None Help from  another person taking care of personal grooming?: None Help from another person toileting, which includes using toliet, bedpan, or urinal?: A Little Help from another person bathing (including washing, rinsing, drying)?: A Little Help from another person to put on and taking off regular upper body clothing?: None Help from another person to put on and taking off regular lower body clothing?: A Little 6 Click Score: 21   End of Session Equipment Utilized During Treatment: Rolling walker;Gait belt;Oxygen (1L)  Activity Tolerance: Patient limited by lethargy Patient left: in chair;with call bell/phone within reach;with family/visitor present;Other (comment) (spouse present)  OT Visit Diagnosis: Unsteadiness on feet (R26.81);Muscle weakness (generalized) (M62.81);Pain;Other symptoms and signs involving cognitive function                Time: 1410-1422 OT Time Calculation (min): 12 min Charges:  OT General Charges $OT Visit: 1 Visit OT Evaluation $OT Eval Low Complexity: [place], OT Acute Rehabilitation Services Pager: [phone] Office: [phone]   Hortencia Pilar 04/22/2020, 2:52 PM

## 2020-04-22 NOTE — TOC Progression Note (Signed)
Transition of Care [facility]) - Progression Note    Patient Details  Name: Kenneth Osborn MRN: [id number] Date of Birth: February 22, 1955  [facility]) CM/SW Contact  Joaquin Courts, RN Phone Number: 04/22/2020, 4:34 PM  Clinical Narrative:    Rocky Morel for home health physical therapy.   Expected Discharge Plan: [place] Barriers to Discharge: No Barriers Identified  Expected Discharge Plan and Services Expected Discharge Plan: [place] arrangements for the past 2 months: Single Family Home Expected Discharge Date: 04/22/20                         HH Arranged: PT HH Agency: [place] Date HH Agency Contacted: 04/22/20 Time [place]: 1634 Representative spoke with at [place]: [place] Determinants of Health ([place]) Interventions    Readmission Risk Interventions No flowsheet data found.

## 2020-04-22 NOTE — Progress Notes (Signed)
Patient ID: Kenneth Osborn, male   DOB: April 24, 1955, 65 y.o.   MRN: [id number]  2 Days Post-Op Subjective: Pt doing much better.  Pain well controlled.  Had BM.  Ambulating better.  Seen by PT today but had to stop due to decreased oxygen saturation after ambulating for a bit.  Objective: Vital signs in last 24 hours: Temp:  [98.3 F (36.8 C)-99.3 F (37.4 C)] 98.6 F (37 C) (03/05 1155) Pulse Rate:  [82-100] 87 (03/05 1155) Resp:  [18-22] 22 (03/05 1155) BP: (103-135)/(60-71) 108/64 (03/05 1155) SpO2:  [94 %-99 %] 99 % (03/05 1155)  Intake/Output from previous day: 03/04 0701 - 03/05 0700 In: 1792.7 [P.O.:960; I.V.:832.7] Out: 2050 [Urine:2050] Intake/Output this shift: Total I/O In: 135.2 [I.V.:135.2] Out: -   Physical Exam:  General: Alert and oriented CV: RRR Abdomen: Soft, ND, NT, positive BS Incisions: C/D/I Ext: NT, No erythema  Lab Results: Recent Labs    04/20/20 1812 04/21/20 0403 04/22/20 0409  HGB 13.3 13.0 11.6*  HCT 40.6 38.8* 36.6*   BMET Recent Labs    04/21/20 0403 04/22/20 0409  NA 134* 134*  K 4.8 4.6  CL 102 100  CO2 24 30  GLUCOSE 399* 315*  BUN 17 20  CREATININE 1.60* 1.94*  CALCIUM 8.9 9.1     Studies/Results: No results found.  Assessment/Plan: POD # 2 s/p left robotic radical nephrectomy and cholecystectomy/liver biopsy - Will leave off oxygen (currently 93% on RA) and re-evaluate later today - Await PT recommendations - Path pending - Renal function increase expected after nephrectomy, will continue to follow as outpatient - Will re-evaluate later today for possible D/C   LOS: 2 days   [name] 04/22/2020, 12:14 PM

## 2020-04-22 NOTE — Anesthesia Postprocedure Evaluation (Signed)
Anesthesia Post Note  Patient: Kenneth Osborn  Procedure(s) Performed: XI ROBOTIC ASSITED LAPAROSCOPIC NEPHRECTOMY (Left ) XI ROBOTIC ASSISTED LAPAROSCOPIC CHOLECYSTECTOMY WITH WEDGE RESECTION OF LIVER (N/A )     Patient location during evaluation: PACU Anesthesia Type: General Level of consciousness: awake and alert Pain management: pain level controlled Vital Signs Assessment: post-procedure vital signs reviewed and stable Respiratory status: spontaneous breathing, nonlabored ventilation, respiratory function stable and patient connected to nasal cannula oxygen Cardiovascular status: blood pressure returned to baseline and stable Postop Assessment: no apparent nausea or vomiting Anesthetic complications: no   No complications documented.                Effie Berkshire

## 2020-04-22 NOTE — Evaluation (Signed)
Physical Therapy Evaluation Patient Details Name: Kenneth Osborn MRN: [id number] DOB: December 12, 1955 Today's Date: 04/22/2020   History of Present Illness  Pt is 65 yo male admitted on 04/20/20 for surgery for kidney neoplasm and chronic cholecystitis.  Pt s/p robotic assisted laparoscopic radical nephrectomy and adrenalectomy,  ROBOTIC CHOLECYSTECTOMY, ROBOTIC LYSIS OF ADHESIONS, ROBOTIC LIVER WEDGE BIOPSY all on 04/20/20. He has extensive medical hx including but not limited to low back pain, neuropathy, DM, mild cognitive impairment, MI, Meniere's disease, post concusive HA, wife reports coma in 12/21 (noted admitted with severe sepsis at that time).    Clinical Impression  Pt admitted with above diagnosis. Pt was lethargic at arrival but motivated to participate and walk.  Pt began ambulation with min guard but with fatigue he had increased forward momentum and decreased safety awareness - requiring chair to sit.  Additionally, pt's O2 sats dropped with activity.   Pt very unaware of his decreased safety/near fall and wanting to walk more.  Pt does have hx of mild cognitive impairment and wife reports some increased forward momentum, L lean, and impulsivity at baseline; however, has not had recent falls.  Hopeful for good progress with therapy when less lethargic and improved O2 sats.  Pt very motivated to go home ASAP, but does need further therapy at this time. Pt currently with functional limitations due to the deficits listed below (see PT Problem List). Pt will benefit from skilled PT to increase their independence and safety with mobility to allow discharge to the venue listed below.       Follow Up Recommendations Supervision/Assistance - 24 hour;Home health PT    Equipment Recommendations  None recommended by PT    Recommendations for Other Services       Precautions / Restrictions Precautions Precautions: Fall      Mobility  Bed Mobility               General bed mobility  comments: in chair at arrival    Transfers Overall transfer level: Needs assistance Equipment used: Rolling walker (2 wheeled) Transfers: Sit to/from Stand Sit to Stand: Min guard         General transfer comment: min guard for safety from recliner  Ambulation/Gait Ambulation/Gait assistance: Min guard;Mod assist Gait Distance (Feet): 180 Feet Assistive device: Rolling walker (2 wheeled) Gait Pattern/deviations: Step-through pattern;Trunk flexed Gait velocity: fast   General Gait Details: Pt with increased forward momentum and multiple cues assist to stay close to RW.  Pt starting with min guard and cues for RW proximity and posture.  As he fatigued he had increased forward and L momentum.  Cued to "stop and reset"  , PT assisted to stop RW and correct pts posture.  He was then able to continue 20 more feet but then again with increasing momentum, therapist attempting to control RW and cue pt but pt becoming unsafe and required nursing to bring a chair and be rolled back to room.  Noted O2 sats were 86% and pt was very lethargic.  Sats up to 90% within a minute.  Pt wanting to continue despite decreased safety, lethargy and decreased O2. Stating wanting to prove he can go home to therapist and wants to go home today.  Stairs            Wheelchair Mobility    Modified Rankin (Stroke Patients Only)       Balance Overall balance assessment: Needs assistance Sitting-balance support: No upper extremity supported Sitting balance-Leahy Scale: Fair  Sitting balance - Comments: Pt leans to L.  Wife reports this is baseline   Standing balance support: Bilateral upper extremity supported Standing balance-Leahy Scale: Poor Standing balance comment: Static stand requiring RW and min guard.  With ambulation increased forward and L momentum requiring min - mod A                             Pertinent Vitals/Pain Pain Assessment: Faces Pain Score: 3  Pain Location:  Stomach from incisions Pain Descriptors / Indicators: Sore Pain Intervention(s): Limited activity within patient's tolerance;Monitored during session    Home Living Family/patient expects to be discharged to:: Private residence Living Arrangements: Spouse/significant other Available Help at Discharge: Family;Available 24 hours/day Type of Home: House Home Access: Level entry     Home Layout: Two level Home Equipment: Cane - single point;Walker - 2 wheels;Bedside commode;Shower seat;Grab bars - tub/shower Additional Comments: reports BSC too large    Prior Function Level of Independence: Needs assistance   Gait / Transfers Assistance Needed: Occasionally walks without AD; uses cane and walker or furniture surfs 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); can ambulate in community  ADL's / Homemaking Assistance Needed: Independent with ADLs or if bad day wife assist; typically stands for showers but has chair if needed  Comments: No recent falls     Hand Dominance   Dominant Hand: Right    Extremity/Trunk Assessment   Upper Extremity Assessment Upper Extremity Assessment: Overall WFL for tasks assessed    Lower Extremity Assessment Lower Extremity Assessment: Overall WFL for tasks assessed    Cervical / Trunk Assessment Cervical / Trunk Assessment: Kyphotic  Communication   Communication: No difficulties  Cognition Arousal/Alertness: Awake/alert Behavior During Therapy: Impulsive Overall Cognitive Status: Impaired/Different from baseline Area of Impairment: Awareness;Orientation;Problem solving;Safety/judgement;Following commands;Attention                 Orientation Level: Disoriented to;Time Current Attention Level: Selective   Following Commands: Follows one step commands inconsistently Safety/Judgement: Decreased awareness of safety;Decreased awareness of deficits Awareness: Emergent Problem Solving: Requires verbal cues;Requires tactile cues General  Comments: Pt impulsive and unaware of current deficits.  Very off balance and wants to keep walking despite lethargy, decreased O2, poor balance.  Very motivated to go home to the point of decreased safety in "wanting to prove" he can walk to therapy      General Comments General comments (skin integrity, edema, etc.): Pt on RA with sats 93% rest, DOE 3/4 walking and sats down to 86% and recovering in 1 minute.  Pt lethargic prior and falling asleep after therapy.  Attempting to notifiy nurse immediately but [employer] on floor and RN busy.  This pt was stable, notified RN at later time.  She reports pt had not received pain meds but did receive his regular meds which could contribute to lethargy.  She also reports increased forward momentum when she walked him this morning but he was wearing O2 at that time and sats stable.  Wife reports pt does tend to have forward momentum at home, but maybe not this severe. States he has not had recent falls.    Exercises     Assessment/Plan    PT Assessment Patient needs continued PT services  PT Problem List Decreased strength;Decreased mobility;Decreased safety awareness;Decreased coordination;Decreased knowledge of precautions;Decreased activity tolerance;Decreased cognition;Cardiopulmonary status limiting activity;Decreased balance;Decreased knowledge of use of DME       PT Treatment Interventions DME  instruction;Therapeutic activities;Gait training;Therapeutic exercise;Patient/family education;Balance training;Functional mobility training;Neuromuscular re-education    PT Goals (Current goals can be found in the Care Plan section)  Acute Rehab PT Goals Patient Stated Goal: return home today and church tomorrow PT Goal Formulation: With patient/family Time For Goal Achievement: 05/06/20 Potential to Achieve Goals: Good    Frequency Min 3X/week   Barriers to discharge        Co-evaluation               AM-PAC PT "6 Clicks" Mobility   Outcome Measure Help needed turning from your back to your side while in a flat bed without using bedrails?: A Little Help needed moving from lying on your back to sitting on the side of a flat bed without using bedrails?: A Little Help needed moving to and from a bed to a chair (including a wheelchair)?: A Little Help needed standing up from a chair using your arms (e.g., wheelchair or bedside chair)?: A Little Help needed to walk in hospital room?: A Lot Help needed climbing 3-5 steps with a railing? : Total 6 Click Score: 15    End of Session Equipment Utilized During Treatment: Gait belt Activity Tolerance: Patient limited by lethargy;Other (comment) (decreased safety awareness) Patient left: in chair;with call bell/phone within reach;with family/visitor present (pt in chair at arrival without alarm, wife present, wife assured therapist she was not leaving) Nurse Communication: Mobility status PT Visit Diagnosis: Unsteadiness on feet (R26.81);Dizziness and giddiness (R42)    Time: 3128-1188 PT Time Calculation (min) (ACUTE ONLY): 35 min   Charges:   PT Evaluation $PT Eval Moderate Complexity: 1 Mod PT Treatments $Gait Training: 8-22 mins        Abran Richard, PT Acute Rehab Services Pager [phone] [facility] 04/22/2020, 12:58 PM

## 2020-04-23 ENCOUNTER — Inpatient Hospital Stay (HOSPITAL_COMMUNITY): Payer: Medicare Other

## 2020-04-23 ENCOUNTER — Encounter (HOSPITAL_COMMUNITY): Payer: Self-pay | Admitting: Internal Medicine

## 2020-04-23 ENCOUNTER — Other Ambulatory Visit: Payer: Self-pay

## 2020-04-23 ENCOUNTER — Inpatient Hospital Stay (HOSPITAL_COMMUNITY)
Admission: EM | Admit: 2020-04-23 | Discharge: 2020-04-26 | DRG: 189 | Disposition: A | Discharge status: Skilled Nursing Facility | Payer: Medicare Other | Attending: Internal Medicine | Admitting: Internal Medicine

## 2020-04-23 ENCOUNTER — Emergency Department (HOSPITAL_COMMUNITY): Payer: Medicare Other

## 2020-04-23 DIAGNOSIS — Z9049 Acquired absence of other specified parts of digestive tract: Secondary | ICD-10-CM

## 2020-04-23 DIAGNOSIS — F1120 Opioid dependence, uncomplicated: Secondary | ICD-10-CM | POA: Diagnosis present

## 2020-04-23 DIAGNOSIS — G894 Chronic pain syndrome: Secondary | ICD-10-CM | POA: Diagnosis present

## 2020-04-23 DIAGNOSIS — Z955 Presence of coronary angioplasty implant and graft: Secondary | ICD-10-CM

## 2020-04-23 DIAGNOSIS — I252 Old myocardial infarction: Secondary | ICD-10-CM | POA: Diagnosis not present

## 2020-04-23 DIAGNOSIS — Z9682 Presence of neurostimulator: Secondary | ICD-10-CM

## 2020-04-23 DIAGNOSIS — I10 Essential (primary) hypertension: Secondary | ICD-10-CM | POA: Diagnosis present

## 2020-04-23 DIAGNOSIS — R350 Frequency of micturition: Secondary | ICD-10-CM | POA: Diagnosis present

## 2020-04-23 DIAGNOSIS — N1832 Chronic kidney disease, stage 3b: Secondary | ICD-10-CM | POA: Diagnosis present

## 2020-04-23 DIAGNOSIS — Z794 Long term (current) use of insulin: Secondary | ICD-10-CM

## 2020-04-23 DIAGNOSIS — K812 Acute cholecystitis with chronic cholecystitis: Secondary | ICD-10-CM | POA: Diagnosis not present

## 2020-04-23 DIAGNOSIS — K5900 Constipation, unspecified: Secondary | ICD-10-CM | POA: Diagnosis present

## 2020-04-23 DIAGNOSIS — J9601 Acute respiratory failure with hypoxia: Principal | ICD-10-CM | POA: Diagnosis present

## 2020-04-23 DIAGNOSIS — K219 Gastro-esophageal reflux disease without esophagitis: Secondary | ICD-10-CM | POA: Diagnosis present

## 2020-04-23 DIAGNOSIS — R0902 Hypoxemia: Secondary | ICD-10-CM

## 2020-04-23 DIAGNOSIS — J9811 Atelectasis: Secondary | ICD-10-CM | POA: Diagnosis present

## 2020-04-23 DIAGNOSIS — R531 Weakness: Secondary | ICD-10-CM

## 2020-04-23 DIAGNOSIS — R509 Fever, unspecified: Secondary | ICD-10-CM | POA: Diagnosis not present

## 2020-04-23 DIAGNOSIS — I2510 Atherosclerotic heart disease of native coronary artery without angina pectoris: Secondary | ICD-10-CM | POA: Diagnosis present

## 2020-04-23 DIAGNOSIS — E0842 Diabetes mellitus due to underlying condition with diabetic polyneuropathy: Secondary | ICD-10-CM | POA: Diagnosis not present

## 2020-04-23 DIAGNOSIS — E785 Hyperlipidemia, unspecified: Secondary | ICD-10-CM | POA: Diagnosis present

## 2020-04-23 DIAGNOSIS — G4733 Obstructive sleep apnea (adult) (pediatric): Secondary | ICD-10-CM | POA: Diagnosis present

## 2020-04-23 DIAGNOSIS — Z905 Acquired absence of kidney: Secondary | ICD-10-CM | POA: Diagnosis not present

## 2020-04-23 DIAGNOSIS — Z981 Arthrodesis status: Secondary | ICD-10-CM | POA: Diagnosis not present

## 2020-04-23 DIAGNOSIS — E7800 Pure hypercholesterolemia, unspecified: Secondary | ICD-10-CM | POA: Diagnosis present

## 2020-04-23 DIAGNOSIS — N179 Acute kidney failure, unspecified: Secondary | ICD-10-CM | POA: Diagnosis present

## 2020-04-23 DIAGNOSIS — N183 Chronic kidney disease, stage 3 unspecified: Secondary | ICD-10-CM | POA: Diagnosis present

## 2020-04-23 DIAGNOSIS — Z20822 Contact with and (suspected) exposure to covid-19: Secondary | ICD-10-CM | POA: Diagnosis present

## 2020-04-23 DIAGNOSIS — Z833 Family history of diabetes mellitus: Secondary | ICD-10-CM

## 2020-04-23 DIAGNOSIS — D49512 Neoplasm of unspecified behavior of left kidney: Secondary | ICD-10-CM | POA: Diagnosis not present

## 2020-04-23 DIAGNOSIS — E1142 Type 2 diabetes mellitus with diabetic polyneuropathy: Secondary | ICD-10-CM | POA: Diagnosis present

## 2020-04-23 DIAGNOSIS — Z79899 Other long term (current) drug therapy: Secondary | ICD-10-CM | POA: Diagnosis not present

## 2020-04-23 DIAGNOSIS — F419 Anxiety disorder, unspecified: Secondary | ICD-10-CM | POA: Diagnosis present

## 2020-04-23 DIAGNOSIS — N3941 Urge incontinence: Secondary | ICD-10-CM | POA: Diagnosis present

## 2020-04-23 DIAGNOSIS — Z8249 Family history of ischemic heart disease and other diseases of the circulatory system: Secondary | ICD-10-CM | POA: Diagnosis not present

## 2020-04-23 DIAGNOSIS — Z7982 Long term (current) use of aspirin: Secondary | ICD-10-CM | POA: Diagnosis not present

## 2020-04-23 DIAGNOSIS — E1165 Type 2 diabetes mellitus with hyperglycemia: Secondary | ICD-10-CM

## 2020-04-23 LAB — COMPREHENSIVE METABOLIC PANEL
ALT: 24 U/L (ref 0–44)
AST: 25 U/L (ref 15–41)
Albumin: 3.3 g/dL — ABNORMAL LOW (ref 3.5–5.0)
Alkaline Phosphatase: 67 U/L (ref 38–126)
Anion gap: 8 (ref 5–15)
BUN: 24 mg/dL — ABNORMAL HIGH (ref 8–23)
CO2: 26 mmol/L (ref 22–32)
Calcium: 9.5 mg/dL (ref 8.9–10.3)
Chloride: 103 mmol/L (ref 98–111)
Creatinine, Ser: 2.07 mg/dL — ABNORMAL HIGH (ref 0.61–1.24)
GFR, Estimated: 35 mL/min — ABNORMAL LOW (ref >60)
Glucose, Bld: 371 mg/dL — ABNORMAL HIGH (ref 70–99)
Potassium: 4.5 mmol/L (ref 3.5–5.1)
Sodium: 137 mmol/L (ref 135–145)
Total Bilirubin: 0.5 mg/dL (ref 0.3–1.2)
Total Protein: 6.6 g/dL (ref 6.5–8.1)

## 2020-04-23 LAB — LACTIC ACID, PLASMA
Lactic Acid, Venous: 1.1 mmol/L (ref 0.5–1.9)
Lactic Acid, Venous: 0.8 mmol/L (ref 0.5–1.9)

## 2020-04-23 LAB — CBC WITH DIFFERENTIAL/PLATELET
Abs Immature Granulocytes: 0.08 10*3/uL — ABNORMAL HIGH (ref 0.00–0.07)
Basophils Absolute: 0 10*3/uL (ref 0.0–0.1)
Basophils Relative: 0 %
Eosinophils Absolute: 0.6 10*3/uL — ABNORMAL HIGH (ref 0.0–0.5)
Eosinophils Relative: 6 %
HCT: 36.5 % — ABNORMAL LOW (ref 39.0–52.0)
Hemoglobin: 11.5 g/dL — ABNORMAL LOW (ref 13.0–17.0)
Immature Granulocytes: 1 %
Lymphocytes Relative: 18 %
Lymphs Abs: 1.7 10*3/uL (ref 0.7–4.0)
MCH: 27.5 pg (ref 26.0–34.0)
MCHC: 31.5 g/dL (ref 30.0–36.0)
MCV: 87.3 fL (ref 80.0–100.0)
Monocytes Absolute: 0.7 10*3/uL (ref 0.1–1.0)
Monocytes Relative: 7 %
Neutro Abs: 6.7 10*3/uL (ref 1.7–7.7)
Neutrophils Relative %: 68 %
Platelets: 206 10*3/uL (ref 150–400)
RBC: 4.18 MIL/uL — ABNORMAL LOW (ref 4.22–5.81)
RDW: 14.2 % (ref 11.5–15.5)
WBC: 9.8 10*3/uL (ref 4.0–10.5)
nRBC: 0 % (ref 0.0–0.2)

## 2020-04-23 LAB — GLUCOSE, CAPILLARY
Glucose-Capillary: 248 mg/dL — ABNORMAL HIGH (ref 70–99)
Glucose-Capillary: 187 mg/dL — ABNORMAL HIGH (ref 70–99)
Glucose-Capillary: 200 mg/dL — ABNORMAL HIGH (ref 70–99)
Glucose-Capillary: 206 mg/dL — ABNORMAL HIGH (ref 70–99)

## 2020-04-23 LAB — URINALYSIS, ROUTINE W REFLEX MICROSCOPIC
Bacteria, UA: NONE SEEN
Bilirubin Urine: NEGATIVE
Glucose, UA: >=500 mg/dL — AB
Ketones, ur: NEGATIVE mg/dL
Leukocytes,Ua: NEGATIVE
Nitrite: NEGATIVE
Protein, ur: NEGATIVE mg/dL
Specific Gravity, Urine: 1.012 (ref 1.005–1.030)
pH: 6 (ref 5.0–8.0)

## 2020-04-23 LAB — RESP PANEL BY RT-PCR (FLU A&B, COVID) ARPGX2
Influenza A by PCR: NEGATIVE
Influenza B by PCR: NEGATIVE
SARS Coronavirus 2 by RT PCR: NEGATIVE

## 2020-04-23 LAB — PROCALCITONIN: Procalcitonin: 1.22 ng/mL

## 2020-04-23 LAB — PROTIME-INR
INR: 1.2 (ref 0.8–1.2)
Prothrombin Time: 15.2 seconds (ref 11.4–15.2)

## 2020-04-23 LAB — TROPONIN I (HIGH SENSITIVITY)
Troponin I (High Sensitivity): 6 ng/L (ref <18)
Troponin I (High Sensitivity): 6 ng/L (ref <18)

## 2020-04-23 LAB — LIPASE, BLOOD: Lipase: 22 U/L (ref 11–51)

## 2020-04-23 MED ORDER — TECHNETIUM TO 99M ALBUMIN AGGREGATED
4.4000 | Freq: Once | INTRAVENOUS | Status: AC | PRN
  Administered 2020-04-23: 4.4 via INTRAVENOUS

## 2020-04-23 MED ORDER — SENNOSIDES-DOCUSATE SODIUM 8.6-50 MG PO TABS
1.0000 | ORAL_TABLET | Freq: Two times a day (BID) | ORAL | Status: DC
  Administered 2020-04-23 – 2020-04-26 (×6): 1 via ORAL
  Filled 2020-04-23 (×6): qty 1

## 2020-04-23 MED ORDER — ISOSORBIDE MONONITRATE ER 30 MG PO TB24: 30.0000 mg | ORAL_TABLET | Freq: Every day | ORAL | Status: DC

## 2020-04-23 MED ORDER — PRASUGREL HCL 10 MG PO TABS
10.0000 mg | ORAL_TABLET | Freq: Every day | ORAL | Status: DC
  Administered 2020-04-23 – 2020-04-26 (×4): 10 mg via ORAL
  Filled 2020-04-23 (×4): qty 1

## 2020-04-23 MED ORDER — INSULIN GLARGINE 100 UNIT/ML ~~LOC~~ SOLN: 40.0000 [IU] | Freq: Two times a day (BID) | SUBCUTANEOUS | Status: DC

## 2020-04-23 MED ORDER — PANTOPRAZOLE SODIUM 40 MG PO TBEC
80.0000 mg | DELAYED_RELEASE_TABLET | Freq: Two times a day (BID) | ORAL | Status: DC
  Administered 2020-04-23 – 2020-04-26 (×7): 80 mg via ORAL
  Filled 2020-04-23 (×7): qty 2

## 2020-04-23 MED ORDER — MORPHINE SULFATE (PF) 2 MG/ML IV SOLN
2.0000 mg | INTRAVENOUS | Status: DC | PRN
  Filled 2020-04-23: qty 1

## 2020-04-23 MED ORDER — OXYCODONE HCL 5 MG PO TABS
10.0000 mg | ORAL_TABLET | Freq: Four times a day (QID) | ORAL | Status: DC | PRN
  Administered 2020-04-23 – 2020-04-25 (×4): 10 mg via ORAL
  Filled 2020-04-23 (×5): qty 2

## 2020-04-23 MED ORDER — MAGNESIUM OXIDE 400 (241.3 MG) MG PO TABS
400.0000 mg | ORAL_TABLET | Freq: Every day | ORAL | Status: DC
  Administered 2020-04-23 – 2020-04-26 (×4): 400 mg via ORAL
  Filled 2020-04-23 (×4): qty 1

## 2020-04-23 MED ORDER — ROSUVASTATIN CALCIUM 20 MG PO TABS
40.0000 mg | ORAL_TABLET | Freq: Every day | ORAL | Status: DC
  Administered 2020-04-23 – 2020-04-25 (×3): 40 mg via ORAL
  Filled 2020-04-23 (×3): qty 2

## 2020-04-23 MED ORDER — LACTATED RINGERS IV SOLN: INTRAVENOUS | Status: DC

## 2020-04-23 MED ORDER — INSULIN ASPART 100 UNIT/ML ~~LOC~~ SOLN
0.0000 [IU] | Freq: Three times a day (TID) | SUBCUTANEOUS | Status: DC
  Administered 2020-04-23: 4 [IU] via SUBCUTANEOUS
  Administered 2020-04-23: 7 [IU] via SUBCUTANEOUS
  Administered 2020-04-23: 4 [IU] via SUBCUTANEOUS
  Administered 2020-04-24: 11 [IU] via SUBCUTANEOUS
  Administered 2020-04-24: 7 [IU] via SUBCUTANEOUS
  Administered 2020-04-24: 11 [IU] via SUBCUTANEOUS
  Administered 2020-04-25: 7 [IU] via SUBCUTANEOUS
  Administered 2020-04-25: 4 [IU] via SUBCUTANEOUS
  Administered 2020-04-25: 11 [IU] via SUBCUTANEOUS
  Administered 2020-04-26: 4 [IU] via SUBCUTANEOUS
  Administered 2020-04-26: 20 [IU] via SUBCUTANEOUS
  Filled 2020-04-23: qty 0.2

## 2020-04-23 MED ORDER — NIACIN ER (ANTIHYPERLIPIDEMIC) 500 MG PO TBCR
500.0000 mg | EXTENDED_RELEASE_TABLET | Freq: Every day | ORAL | Status: DC
  Administered 2020-04-23 – 2020-04-25 (×3): 500 mg via ORAL
  Filled 2020-04-23 (×3): qty 1

## 2020-04-23 MED ORDER — METOPROLOL SUCCINATE ER 100 MG PO TB24
100.0000 mg | ORAL_TABLET | Freq: Two times a day (BID) | ORAL | Status: DC
  Administered 2020-04-23 – 2020-04-26 (×6): 100 mg via ORAL
  Filled 2020-04-23 (×6): qty 1

## 2020-04-23 MED ORDER — GABAPENTIN 400 MG PO CAPS
1600.0000 mg | ORAL_CAPSULE | Freq: Two times a day (BID) | ORAL | Status: DC
  Administered 2020-04-23 – 2020-04-26 (×7): 1600 mg via ORAL
  Filled 2020-04-23 (×7): qty 4

## 2020-04-23 MED ORDER — POLYETHYLENE GLYCOL 3350 17 G PO PACK
17.0000 g | PACK | Freq: Every day | ORAL | Status: DC
  Administered 2020-04-23 – 2020-04-24 (×2): 17 g via ORAL
  Filled 2020-04-23 (×2): qty 1

## 2020-04-23 MED ORDER — METOPROLOL SUCCINATE ER 50 MG PO TB24: 100.0000 mg | ORAL_TABLET | Freq: Two times a day (BID) | ORAL | Status: DC

## 2020-04-23 MED ORDER — DILTIAZEM HCL ER COATED BEADS 240 MG PO CP24
240.0000 mg | ORAL_CAPSULE | Freq: Every day | ORAL | Status: DC
  Administered 2020-04-23 – 2020-04-26 (×4): 240 mg via ORAL
  Filled 2020-04-23 (×4): qty 1

## 2020-04-23 MED ORDER — INSULIN ASPART 100 UNIT/ML ~~LOC~~ SOLN
6.0000 [IU] | Freq: Three times a day (TID) | SUBCUTANEOUS | Status: DC
  Administered 2020-04-23 – 2020-04-26 (×11): 6 [IU] via SUBCUTANEOUS

## 2020-04-23 MED ORDER — DOCUSATE SODIUM 100 MG PO CAPS
100.0000 mg | ORAL_CAPSULE | Freq: Every day | ORAL | Status: DC | PRN
  Administered 2020-04-24: 100 mg via ORAL
  Filled 2020-04-23: qty 1

## 2020-04-23 MED ORDER — ENOXAPARIN SODIUM 40 MG/0.4ML ~~LOC~~ SOLN
40.0000 mg | SUBCUTANEOUS | Status: DC
  Administered 2020-04-23 – 2020-04-26 (×4): 40 mg via SUBCUTANEOUS
  Filled 2020-04-23 (×4): qty 0.4

## 2020-04-23 MED ORDER — ONDANSETRON HCL 4 MG/2ML IJ SOLN: 4.0000 mg | Freq: Four times a day (QID) | INTRAMUSCULAR | Status: DC | PRN

## 2020-04-23 MED ORDER — ACETAMINOPHEN 650 MG RE SUPP: 650.0000 mg | Freq: Four times a day (QID) | RECTAL | Status: DC | PRN

## 2020-04-23 MED ORDER — SODIUM CHLORIDE 0.9 % IV BOLUS
500.0000 mL | Freq: Once | INTRAVENOUS | Status: AC
  Administered 2020-04-23: 500 mL via INTRAVENOUS

## 2020-04-23 MED ORDER — DIAZEPAM 5 MG PO TABS: 5.0000 mg | ORAL_TABLET | Freq: Three times a day (TID) | ORAL | Status: DC | PRN

## 2020-04-23 MED ORDER — EZETIMIBE 10 MG PO TABS
10.0000 mg | ORAL_TABLET | Freq: Every day | ORAL | Status: DC
  Administered 2020-04-23 – 2020-04-26 (×4): 10 mg via ORAL
  Filled 2020-04-23 (×4): qty 1

## 2020-04-23 MED ORDER — ALPRAZOLAM 0.5 MG PO TABS
0.5000 mg | ORAL_TABLET | Freq: Every day | ORAL | Status: DC
  Administered 2020-04-23 – 2020-04-25 (×3): 0.5 mg via ORAL
  Filled 2020-04-23 (×3): qty 1

## 2020-04-23 MED ORDER — MECLIZINE HCL 25 MG PO TABS
25.0000 mg | ORAL_TABLET | Freq: Three times a day (TID) | ORAL | Status: DC | PRN
Start: 2020-04-23 — End: 2020-04-26

## 2020-04-23 MED ORDER — INSULIN ASPART 100 UNIT/ML ~~LOC~~ SOLN
10.0000 [IU] | Freq: Three times a day (TID) | SUBCUTANEOUS | Status: DC
  Filled 2020-04-23: qty 0.1

## 2020-04-23 MED ORDER — INSULIN GLARGINE 100 UNIT/ML ~~LOC~~ SOLN
40.0000 [IU] | Freq: Two times a day (BID) | SUBCUTANEOUS | Status: DC
  Administered 2020-04-23 (×2): 40 [IU] via SUBCUTANEOUS
  Filled 2020-04-23 (×3): qty 0.4

## 2020-04-23 MED ORDER — ACETAMINOPHEN 325 MG PO TABS
650.0000 mg | ORAL_TABLET | Freq: Four times a day (QID) | ORAL | Status: DC | PRN
  Administered 2020-04-23: 650 mg via ORAL
  Filled 2020-04-23: qty 2

## 2020-04-23 MED ORDER — ASPIRIN EC 81 MG PO TBEC
81.0000 mg | DELAYED_RELEASE_TABLET | Freq: Every day | ORAL | Status: DC
  Administered 2020-04-23 – 2020-04-25 (×3): 81 mg via ORAL
  Filled 2020-04-23 (×3): qty 1

## 2020-04-23 MED ORDER — HYDROCORTISONE (PERIANAL) 2.5 % EX CREA
1.0000 "application " | TOPICAL_CREAM | Freq: Every day | CUTANEOUS | Status: DC | PRN
  Filled 2020-04-23: qty 30

## 2020-04-23 MED ORDER — INSULIN GLARGINE 100 UNIT/ML ~~LOC~~ SOLN: 30.0000 [IU] | Freq: Two times a day (BID) | SUBCUTANEOUS | Status: DC

## 2020-04-23 MED ORDER — MORPHINE SULFATE (PF) 2 MG/ML IV SOLN: 2.0000 mg | INTRAVENOUS | Status: DC | PRN

## 2020-04-23 MED ORDER — DILTIAZEM HCL ER COATED BEADS 120 MG PO CP24: 240.0000 mg | ORAL_CAPSULE | Freq: Every day | ORAL | Status: DC

## 2020-04-23 MED ORDER — MONTELUKAST SODIUM 10 MG PO TABS
10.0000 mg | ORAL_TABLET | Freq: Every day | ORAL | Status: DC
  Administered 2020-04-23 – 2020-04-25 (×3): 10 mg via ORAL
  Filled 2020-04-23 (×3): qty 1

## 2020-04-23 MED ORDER — CYCLOBENZAPRINE HCL 10 MG PO TABS: 10.0000 mg | ORAL_TABLET | Freq: Three times a day (TID) | ORAL | Status: DC | PRN

## 2020-04-23 MED ORDER — LORATADINE 10 MG PO TABS
10.0000 mg | ORAL_TABLET | Freq: Every day | ORAL | Status: DC
  Administered 2020-04-24 – 2020-04-26 (×3): 10 mg via ORAL
  Filled 2020-04-23 (×4): qty 1

## 2020-04-23 MED ORDER — ONDANSETRON HCL 4 MG PO TABS: 4.0000 mg | ORAL_TABLET | Freq: Four times a day (QID) | ORAL | Status: DC | PRN

## 2020-04-23 NOTE — H&P (Signed)
History and Physical    Kenneth Osborn:[id number] DOB: 1955/09/20 DOA: 04/23/2020  PCP: [name], MD  Patient coming from: Home  I have personally briefly reviewed patient's old medical records in [place]  Chief Complaint: Post-op problem  HPI: Kenneth Osborn is a 65 y.o. 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  In Dec pt diagnosed with acute cholecystitis.  Pt underwent lap chole on 3/3, while they were doing the Lap chole, Urology also performed L nephrectomy for a 6cm renal mass (probably CA).  Surgery performed by Dr. Johney Maine and Dr. Alinda Money.  Pt discharged from hospital yesterday.  This evening, patient attempted to walk to the bathroom to urinate.  He was incontinent prior to getting to the bathroom, then once arriving there, was unable to walk back to bed.  He actually fell to the floor because he was so weak.  Is complaining of abdominal pain.  Wife tells me she checked his temperature and it was 101.4.  Patient was found to be hypoxic and placed on oxygen.  Pt presents to ED with generalized weakness, new O2 requirement, and reported fever.  Symptoms are severe, symptoms are persistent, nothing makes better or worse.  Pt reports ongoing abd pain and urinary frequency as well.   ED Course: CT abd/pelvis: 6cm loculated fluid collection in renal fossa, air in abdomen within expected limits.  Small pleural effusions with atelectasis.  Pt continues to have New O2 requirement sating upper 80s at rest on RA.  Creat 2.0 up from 0.8 during preop testing on 2/22.  CXR neg  COVID pending.   Review of Systems: As per HPI, otherwise all review of systems negative.  Past Medical History:  Diagnosis Date  . Acute postoperative pain 12/03/2018  . Allergic rhinitis 12/30/2012  . Anginal pain ([place])   . Anxiety   . Bronchitis    hx of  . Can't get food down 08/12/2014  . Cancer ([place]) 01/2020   kidney   . Chronic  back pain    thoracic area  . Concussion 09/2015  . Coronary artery disease    99% blockage  . DDD (degenerative disc disease), cervical   . Dehydration symptoms    2019  . 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 Dysphagia   . GERD (gastroesophageal reflux disease)   . History of Meniere's disease 12/21/2014  . History of thoracic spine surgery (S/P T9-10 IVD spacer) 12/21/2014  . Hypercholesteremia   . Hyperlipidemia   . Hypertension    sees Dr. Jenny Reichmann walker Jefm Bryant  . 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 Myocardial infarction [facility]) 2012   Sees Dr. Drema [place], [place] [facility]  . Neuromuscular disorder ([place])    diabetic neuropathy in feet  . Severe sepsis ([place]) 02/07/2020  . Short-segment Barrett's esophagus   . Sleep apnea    mild    Past Surgical History:  Procedure Laterality Date  . APPENDECTOMY     age 45  . ARTHRODESIS ANTERIOR ANTERIOR CERVICLE SPINE  01/04/2013  . BACK SURGERY     fusion thoracic area  . CARDIAC CATHETERIZATION     may 2012 and Nov 20, 2010  . CARDIAC CATHETERIZATION N/A 06/15/2015   Procedure: Left Heart Cath and Coronary Angiography;  Surgeon: Corey Skains, MD;  Location: [place] CV LAB;  Service: Cardiovascular;  Laterality: N/A;  . CARDIAC CATHETERIZATION N/A  06/15/2015   Procedure: Coronary Stent Intervention;  Surgeon: Isaias Cowman, MD;  Location: [place] [facility];  Service: Cardiovascular;  Laterality: N/A;  . CARDIOVASCULAR STRESS TEST     jan 2014  . COLONOSCOPY WITH PROPOFOL N/A 09/19/2014   Procedure: COLONOSCOPY WITH PROPOFOL;  Surgeon: Manya Silvas, MD;  Location: [facility] ENDOSCOPY;  Service: Endoscopy;  Laterality: N/A;  . CORONARY ANGIOPLASTY     stent placement x3  . ESOPHAGOGASTRODUODENOSCOPY N/A 09/19/2014   Procedure: ESOPHAGOGASTRODUODENOSCOPY (EGD);  Surgeon: Manya Silvas, MD;  Location[facility]COPY;  Service: Endoscopy;  Laterality: N/A;  . IR PERC  CHOLECYSTOSTOMY  02/09/2020  . IR RADIOLOGIST EVAL & MGMT  03/14/2020  . [place] right ear  . LUMBAR SPINAL CORD SIMULATOR LEAD REMOVAL Right 08/09/2019   Procedure: REMOVAL SPINAL CORD STIMULATOR PERCUTANEOUS LEADS, REMOVAL PULSE GENERATOR;  Surgeon: [name], MD;  Location: [facility] ORS;  Service: Neurosurgery;  Laterality: Right;  LOCAL WITH MAC  . mastoid shunt Bilateral    left, 2002, 1997 right ear, 1980 right ear  . PULSE GENERATOR IMPLANT N/A 01/18/2019   Procedure: MEDTRONIC SPINAL CORD STIMULATOR BATTERY EXCHANGE;  Surgeon: [name], MD;  Location: [facility] ORS;  Service: Neurosurgery;  Laterality: N/A;  . ROBOT ASSISTED LAPAROSCOPIC NEPHRECTOMY Left 04/20/2020   Procedure: XI ROBOTIC ASSITED LAPAROSCOPIC NEPHRECTOMY;  Surgeon: Raynelle Bring, MD;  Location: [facility] ORS;  Service: Urology;  Laterality: Left;  . SAVORY DILATION N/A 09/19/2014   Procedure: SAVORY DILATION;  Surgeon: Manya Silvas, MD;  Location[facility] ENDOSCOPY;  Service: Endoscopy;  Laterality: N/A;  . SHOULDER ARTHROSCOPY WITH SUBACROMIAL DECOMPRESSION Left 04/06/2012   Procedure: SHOULDER ARTHROSCOPY WITH SUBACROMIAL DECOMPRESSION;  Surgeon: Vickey Huger, MD;  Location: [place];  Service: Orthopedics;  Laterality: Left;  left shoulder arthroscopy, subacromial decompression and distal clavicle resection  . SPINAL CORD STIMULATOR IMPLANT Right      reports that he has never smoked. He has never used smokeless tobacco. He reports that he does not drink alcohol and does not use drugs.  No Known Allergies  Family History  Problem Relation Age of Onset  . Heart disease Mother   . Diabetes Mother   . Heart disease Father   . Cancer Sister   . Heart disease Maternal Aunt   . Heart disease Maternal Uncle   . Diabetes Maternal Grandmother   . Diabetes Paternal Grandmother      Prior to Admission medications   Medication Sig Start Date End Date Taking? Authorizing Provider  ALPRAZolam Duanne Moron) 0.5 MG tablet  Take 0.5 mg by mouth at bedtime.    [provider]  aspirin EC 81 MG tablet Take 81 mg by mouth at bedtime. Swallow whole.    [provider]  cetirizine (ZYRTEC) 10 MG tablet Take 10 mg by mouth daily.     [provider]  cyclobenzaprine (FLEXERIL) 10 MG tablet Take 10 mg by mouth 3 (three) times daily as needed for muscle spasms.    [provider]  diazepam (VALIUM) 5 MG tablet Take 5 mg by mouth See admin instructions. Take 5 mg in the morning and additional if needed for Meniere's disease up to three a day    [provider]  diltiazem (CARDIZEM CD) 240 MG 24 hr capsule Take 240 mg by mouth daily. 03/30/18   [provider]  docusate sodium (COLACE) 100 MG capsule Take 100 mg by mouth daily as needed for mild constipation.    [provider]  enalapril (VASOTEC) 10 MG tablet Take 10 mg by mouth 2 (two) times daily.  07/28/18   [provider]  esomeprazole (NEXIUM) 40 MG capsule Take 40 mg by mouth 2 (two) times daily.    [provider]  ezetimibe (ZETIA) 10 MG tablet Take 10 mg by mouth daily. 03/26/17   [provider]  gabapentin (NEURONTIN) 800 MG tablet Take 1,600 mg by mouth 2 (two) times daily.    [provider]  Glucagon (BAQSIMI ONE PACK NA) Place 1 Dose into the nose daily as needed (severely low blood sugar).    [provider]  HUMULIN R U-500 KWIKPEN 500 UNIT/ML kwikpen Inject 40 Units into the skin 3 (three) times daily with meals. Patient taking differently: Inject 65-75 Units into the skin See admin instructions. Sliding scale  Can adjust according to blood sugar Inject 75 units with breakfast, 75 units with lunch, 65 units with dinner 01/19/18   Max Sane, MD  hydrocortisone 2.5 % cream Apply 1 application topically daily as needed for hemorrhoids. 02/22/20   [provider]  isosorbide mononitrate (IMDUR) 30 MG 24 hr tablet Take 30 mg by mouth at bedtime.     [provider]  Magnesium Oxide 500 MG TABS Take 500 mg by mouth daily.    [provider]  meclizine (ANTIVERT) 25 MG tablet Take 25 mg by mouth See admin instructions. Take 25 mg in the morning, and additional 25 mg if needed up to a total of three per day for Meniere's disease 07/06/15   [provider]  metoprolol succinate (TOPROL-XL) 100 MG 24 hr tablet Take 100 mg by mouth 2 (two) times daily. Take with or immediately following a meal.    [provider]  montelukast (SINGULAIR) 10 MG tablet Take 10 mg by mouth at bedtime.     [provider]  niacin (NIASPAN) 500 MG CR tablet Take 500 mg by mouth at bedtime.    [provider]  Oxycodone HCl 10 MG TABS Take 1 tablet (10 mg total) by mouth every 6 (six) hours as needed. Must last 30 days 03/25/20 04/24/20  Milinda Pointer, MD  Oxycodone HCl 10 MG TABS Take 1 tablet (10 mg total) by mouth every 6 (six) hours as needed. Must last 30 days 04/24/20 05/24/20  Milinda Pointer, MD  Oxycodone HCl 10 MG TABS Take 1 tablet (10 mg total) by mouth every 6 (six) hours as needed. Must last 30 days 05/24/20 06/23/20  Milinda Pointer, MD  prasugrel (EFFIENT) 10 MG TABS tablet Take 10 mg by mouth daily. 11/22/19   [provider]  rosuvastatin (CRESTOR) 40 MG tablet Take 40 mg by mouth at bedtime.    [provider]  sodium chloride 0.9 % injection Flush the drain daily with 5 ml 03/10/20   Olean Ree, MD    Physical Exam: Vitals:   04/23/20 0215 04/23/20 0225 04/23/20 0315 04/23/20 0400  BP: (!) 108/54 120/79 (!) 143/72 116/63  Pulse: 93 94 90 88  Resp: 20 (!) 29 (!) 27 (!) 28  Temp:      TempSrc:      SpO2: 97% 95% 96% 97%  Weight:      Height:        Constitutional: NAD, calm, comfortable Eyes: PERRL, lids and conjunctivae normal ENMT: Mucous membranes are moist. Posterior pharynx clear of any exudate or lesions.Normal dentition.  Neck: normal, supple, no masses, no  thyromegaly Respiratory: clear to auscultation  bilaterally, no wheezing, no crackles. Normal respiratory effort. No accessory muscle use.  Cardiovascular: Regular rate and rhythm, no murmurs / rubs / gallops. No extremity edema. 2+ pedal pulses. No carotid bruits.  Abdomen: diffuse TTP, no rebound, surgical incisions C/D/I Musculoskeletal: no clubbing / cyanosis. No joint deformity upper and lower extremities. Good ROM, no contractures. Normal muscle tone.  Skin: no rashes, lesions, ulcers. No induration Neurologic: CN 2-12 grossly intact. Sensation intact, DTR normal. Strength 5/5 in all 4.  Psychiatric: Normal judgment and insight. Alert and oriented x 3. Normal mood.    Labs on Admission: I have personally reviewed following labs and imaging studies  CBC: Recent Labs  Lab 04/20/20 1532 04/20/20 1812 04/21/20 0403 04/22/20 0409 04/23/20 0145  WBC  --   --   --   --  9.8  NEUTROABS  --   --   --   --  6.7  HGB 13.3 13.3 13.0 11.6* 11.5*  HCT 39.0 40.6 38.8* 36.6* 36.5*  MCV  --   --   --   --  87.3  PLT  --   --   --   --  568   Basic Metabolic Panel: Recent Labs  Lab 04/20/20 1532 04/20/20 1812 04/21/20 0403 04/22/20 0409 04/23/20 0145  NA 141 138 134* 134* 137  K 3.9 4.1 4.8 4.6 4.5  CL  --  104 102 100 103  CO2  --  21* _0 GLUCOSE  --  309* 399* 315* 371*  BUN  --  _1 24*  CREATININE  --  1.16 1.60* 1.94* 2.07*  CALCIUM  --  9.0 8.9 9.1 9.5   GFR: Estimated Creatinine Clearance: 41 mL/min (A) (by C-G formula based on SCr of 2.07 mg/dL (H)). Liver Function Tests: Recent Labs  Lab 04/23/20 0145  AST 25  ALT 24  ALKPHOS 67  BILITOT 0.5  PROT 6.6  ALBUMIN 3.3*   Recent Labs  Lab 04/23/20 0145  LIPASE 22   No results for input(s): AMMONIA in the last 168 hours. Coagulation Profile: Recent Labs  Lab 04/23/20 0145  INR 1.2   Cardiac Enzymes: No results for input(s): CKTOTAL, CKMB, CKMBINDEX, TROPONINI in the last 168 hours. BNP (last 3  results) No results for input(s): PROBNP in the last 8760 hours. HbA1C: Recent Labs    04/20/20 2108  HGBA1C 7.2*   CBG: Recent Labs  Lab 04/21/20 2016 04/22/20 0043 04/22/20 0440 04/22/20 0737 04/22/20 1147  GLUCAP 331* 285* 275* 251* 295*   Lipid Profile: Recent Labs    04/21/20 0403  TRIG 67   Thyroid Function Tests: No results for input(s): TSH, T4TOTAL, FREET4, T3FREE, THYROIDAB in the last 72 hours. Anemia Panel: No results for input(s): VITAMINB12, FOLATE, FERRITIN, TIBC, IRON, RETICCTPCT in the last 72 hours. Urine analysis:    Component Value Date/Time   COLORURINE STRAW (A) 04/23/2020 0120   APPEARANCEUR CLEAR 04/23/2020 0120   LABSPEC 1.012 04/23/2020 0120   PHURINE 6.0 04/23/2020 0120   GLUCOSEU >=500 (A) 04/23/2020 0120   HGBUR SMALL (A) 04/23/2020 0120   BILIRUBINUR NEGATIVE 04/23/2020 0120   KETONESUR NEGATIVE 04/23/2020 0120   PROTEINUR NEGATIVE 04/23/2020 0120   NITRITE NEGATIVE 04/23/2020 0120   LEUKOCYTESUR NEGATIVE 04/23/2020 0120    Radiological Exams on Admission: CT ABDOMEN PELVIS WO CONTRAST  Result Date: 04/23/2020 CLINICAL DATA:  Status post nephrectomy and cholecystectomy. Abdominal distension, rebound tenderness EXAM: CT ABDOMEN AND PELVIS WITHOUT CONTRAST TECHNIQUE: Multidetector CT  imaging of the abdomen and pelvis was performed following the standard protocol without IV contrast. COMPARISON:  03/07/2020 FINDINGS: Lower chest: Small right pleural effusion is present with compressive atelectasis of the right lower lobe. Mild left basilar atelectasis. Coronary artery stenting has been performed. Cardiac size within normal limits. Trace pericardial effusion is likely physiologic. Hepatobiliary: Status post cholecystectomy and removal of the cholecystostomy catheter. Poorly circumscribed fluid within the gallbladder fossa. No intra or extrahepatic biliary ductal dilation. Liver unremarkable. Pancreas: Unremarkable Spleen: Unremarkable  Adrenals/Urinary Tract: Interval left nephrectomy with small loculated fluid collection within the nephrectomy bed measuring 3.6 x 6.3 cm. This abuts and obscures the left adrenal gland. Right adrenal gland and kidney are unremarkable. Bladder is unremarkable. Stomach/Bowel: Stomach, small bowel, and large bowel are unremarkable. Appendix absent. Trace perihepatic ascites. Punctate foci of free intraperitoneal gas within the abdomen noted anteriorly, likely postsurgical in nature. Subcutaneous gas within the anterior abdominal wall within the suprapubic region is also likely postsurgical in nature. Vascular/Lymphatic: Mild aortoiliac atherosclerotic calcification. No aortic aneurysm. No pathologic adenopathy within the abdomen and pelvis. Reproductive: Prostate is unremarkable. Other: Tiny fat containing umbilical hernia. Infiltration within the anterior abdominal wall may be postsurgical in nature and appears unchanged from prior examination. Rectum unremarkable. Musculoskeletal: No acute bone abnormality. IMPRESSION: Interval left nephrectomy with 6.3 cm loculated fluid collection within the nephrectomy bed. Interval cholecystectomy with poorly circumscribed fluid within the gallbladder fossa and trace perihepatic ascites. Punctate foci of gas within the peritoneum and subcutaneous gas within the suprapubic region within expectation given recent laparoscopic surgery. Small right pleural effusion with bibasilar atelectasis. Aortic Atherosclerosis (ICD10-I70.0). Electronically Signed   By: Fidela Salisbury MD   On: 04/23/2020 02:30   DG Chest Port 1 View  Result Date: 04/23/2020 CLINICAL DATA:  Weakness, hypoxia EXAM: PORTABLE CHEST 1 VIEW COMPARISON:  02/13/2020 FINDINGS: Lung volumes are small and there is mild elevation of the right hemidiaphragm with asymmetric right basilar atelectasis. No superimposed focal pulmonary infiltrate. No pneumothorax or pleural effusion. Cardiac size within normal limits. Pulmonary  vascularity normal. No acute bone abnormality. IMPRESSION: Pulmonary hypoinflation Electronically Signed   By: Fidela Salisbury MD   On: 04/23/2020 02:19    EKG: Independently reviewed.  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 Acute respiratory failure with hypoxia - 1. Pt sating upper 80s on RA at rest, while awake and talking. 2. RR slightly increased (dont think this is due to sedation). 3. CXR neg 4. Getting VQ scan 5. Lung bases on CT = atelectasis from small effusions 1. Will put on IS 6. Cont pulse ox 7. O2 via [place] 8. COVID test pending 2. AKI - 1. Creat elevation more than I would expect just from missing 1 kidney I think (Creat from 0.8 on 2/22 to 2.0 today), Dr. Edd Arbour to weigh in on this later today. 2. IVF: LR at 100 3. Strict intake and output 4. Repeat BMP tomorrow AM 3. POD #3 nephrectomy and cholecystectomy - 1. CT findings possibly just normal post op 2. Dr. Alinda Money consulted and will  see later today. 3. Pain control with morphine in acute setting 4. CPS and opiate dependence - 1. Hold home oxy 2. Use PRN morphine in acute setting 3. Cont PRN flexeril 4. Cont sched neurontin 5. DM2 - 1. Hold U500 2. Stop metformin (creat 2.0) 3. Hold other home hypoglycemics 4. Pt was on 215u insulin / day before surgery. 5. Therefore will aim for a ~50% dosing here given AKI: 1. Lantus 40u BID 2. 6u novolog mealtimes 3. Resistant SSI TID AC 4. Diabetes coordinator consult 6. HTN - 1. BPs actually  running quite soft for patient here in ED 2. Holding BP meds for the moment 7. CAD - 1. Pt scheduled to restart effient today 2. Will go ahead and order effient to restart 3. Cont statin  DVT prophylaxis: Lovenox Code Status: Full Family Communication: Family at bedside Disposition Plan: Home after O2 requirement improved Consults called: EDP spoke with Dr. Alinda Money Admission status: Admit to inpatient  Severity of Illness: The appropriate patient status for this patient is INPATIENT. Inpatient status is judged to be reasonable and necessary in order to provide the required intensity of service to ensure the patient's safety. The patient's presenting symptoms, physical exam findings, and initial radiographic and laboratory data in the context of their chronic comorbidities is felt to place them at high risk for further clinical deterioration. Furthermore, it is not anticipated that the patient will be medically stable for discharge from the hospital within 2 midnights of admission. The following factors support the patient status of inpatient.   IP status due to new O2 requirement.   * I certify that at the point of admission it is my clinical judgment that the patient will require inpatient hospital care spanning beyond 2 midnights from the point of admission due to high intensity of service, high risk for further deterioration and high frequency of surveillance required.*    Nainika Newlun M. DO [facility]  How to contact the [facility] Attending or Consulting provider [place] or covering provider during after hours [place], for this patient?  1. Check the care team in [facility] and look for a) attending/consulting [facility] provider listed and b) the [facility] team listed 2. Log into [email or web address]  Amion Physician Scheduling and messaging for groups and whole hospitals  On call and physician scheduling software for group practices, residents, hospitalists and other medical providers for call, clinic,  rotation and shift schedules. On[employer] is a hospital-wide system for scheduling doctors and paging doctors on call. EasyPlot is for scientific plotting and data analysis.  [email or web address]  and use [place]'s universal password to access. If you do not have the password, please contact the hospital operator.  3. Locate the [facility] provider you are looking for under [facility] and page to a number that you can be directly reached. 4. If you still have difficulty reaching the provider, please page the [facility] (Director on Call) for the Hospitalists listed on amion for assistance.  04/23/2020, 5:23 AM

## 2020-04-23 NOTE — ED Triage Notes (Signed)
Pt arrives via EMS from home, pt had his kidney and gallbladder removed, abdominal distention, rebound tenderness, no bleeding per EMS, little urine output hyperglycemic at 493, pressure is low for his baseline at 130/80, 12 lead normal with EMS

## 2020-04-23 NOTE — Progress Notes (Signed)
Inpatient Diabetes Program Recommendations  AACE/ADA: New Consensus Statement on Inpatient Glycemic Control (2015)  Target Ranges:  Prepandial:   less than 140 mg/dL      Peak postprandial:   less than 180 mg/dL (1-2 hours)      Critically ill patients:  140 - 180 mg/dL   Results for KOBY, PICKUP" (MRN [id number]) as of 04/23/2020 10:36  Ref. Range 04/23/2020 08:03  Glucose-Capillary Latest Ref Range: 70 - 99 mg/dL 248 (H)  13 units NOVOLOG     Just discharged 3/5 after admission for Left renal neoplasm, cholecystitis--Back to ED via EMS 3/6 1am with generalized weakness, new O2 requirement, and reported fever  Diabetes history: Type 2 DM  Outpatient Diabetes meds: U-500 Concentrated Insulin 75 units with Breakfast/75 units with Lunch /65 units with Dinner            Metformin 1000 mg BID  Current Orders: Lantus 40 units BID      Novolog Resistant Correction Scale/ SSI (0-20 units) TID AC       Novolog 6 units TID with meals    MD- Note pt uses Concentrated U500 Insulin at home.  Do not recommend restart of the U500 Insulin until pt able to consume at least 75% of meals.  Note Novolog SSI and Novolog meal coverage started this AM.  Also note Lantus to start this AM as well.  Agree with start of basal-bolus therapy for now.   --Will follow patient during hospitalization--  Wyn Quaker RN, MSN, CDE Diabetes Coordinator Inpatient Glycemic Control Team Team Pager: [phone] (8a-5p)

## 2020-04-23 NOTE — Progress Notes (Signed)
Shavonna Corella RRT CN - discussed with primary RN - Danna keeping low temperature and monitoring the patient with increased heart rate as well when rounding floors.

## 2020-04-23 NOTE — Progress Notes (Signed)
1930 [place] at yellow  Temp 100.6 tylenol 620m given, using flutter valve and incentive. X [place] made aware D FResearch scientist (life sciences)

## 2020-04-23 NOTE — Consult Note (Signed)
Urology Consult   Physician requesting consult: Dr. Jennette Kettle  Reason for consult: S/P nephrectomy 3/3  History of Present Illness: Kenneth Osborn is a 65 y.o. with a history of acute cholecystitis in December at [facility] requiring cholecysotosmy drainage.  Incidentally, he was found to have a 6 cm left renal mass consistent with probable renal cell carcinoma.  After recovering from his acute infection, he underwent combination left robotic radical nephrectomy by myself and cholecystectomy and liver biopsy by Dr. Johney Maine.  He has multiple medical comorbidities including CAD s/p cardiac stenting (on ASA and Effient) and chronic pain with significant chronic narcotic pain medication usage along with benzodiazapine use.  His postoperative course was relatively unremarkable.  He was evaluated yesterday morning and was noted to be mildly hypoxic after ambulating and when sleeping but he was 94-95% on RA when awake.  He was discharged home yesterday late afternoon with plans for HHPT to assist with recovery.  According to him and his wife, he had an urge to void and while walking to the bathroom, he was weak and fell. He denied SOB.  He did not lose consciousness.  He was evaluated by EMS and his oxygen saturation levels were in the high 70s.  In addition, his wife states he had a temperature around 101 at home but he has been afebrile since arrival to the ED. Oxygen saturations around 88-89% on RA in ED.    Past Medical History:  Diagnosis Date  . Acute postoperative pain 12/03/2018  . Allergic rhinitis 12/30/2012  . Anginal pain ([place])   . Anxiety   . Bronchitis    hx of  . Can't get food down 08/12/2014  . Cancer ([place]) 01/2020   kidney   . Chronic back pain    thoracic area  . Concussion 09/2015  . Coronary artery disease    99% blockage  . DDD (degenerative disc disease), cervical   . Dehydration symptoms    2019  . Diabetes mellitus without complication (HCC)    insulin  dependent  . Dysphagia   . GERD (gastroesophageal reflux disease)   . History of Meniere's disease 12/21/2014  . History of thoracic spine surgery (S/P T9-10 IVD spacer) 12/21/2014  . Hypercholesteremia   . Hyperlipidemia   . Hypertension    sees Dr. Jenny Reichmann walker Jefm Bryant  . 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 Myocardial infarction [facility]) 2012   Sees Dr. Drema Dallas, [place] [facility]  . Neuromuscular disorder ([place])    diabetic neuropathy in feet  . Severe sepsis ([place]) 02/07/2020  . Short-segment Barrett's esophagus   . Sleep apnea    mild    Past Surgical History:  Procedure Laterality Date  . APPENDECTOMY     age 31  . ARTHRODESIS ANTERIOR ANTERIOR CERVICLE SPINE  01/04/2013  . BACK SURGERY     fusion thoracic area  . CARDIAC CATHETERIZATION     may 2012 and Nov 20, 2010  . CARDIAC CATHETERIZATION N/A 06/15/2015   Procedure: Left Heart Cath and Coronary Angiography;  Surgeon: Corey Skains, MD;  Location: [place] CV LAB;  Service: Cardiovascular;  Laterality: N/A;  . CARDIAC CATHETERIZATION N/A 06/15/2015   Procedure: Coronary Stent Intervention;  Surgeon: Isaias Cowman, MD;  Location: [place] CV LAB;  Service: Cardiovascular;  Laterality: N/A;  . CARDIOVASCULAR STRESS TEST     jan 2014  . COLONOSCOPY WITH PROPOFOL N/A 09/19/2014   Procedure: COLONOSCOPY WITH PROPOFOL;  Surgeon:  Manya Silvas, MD;  Location: [facility] ENDOSCOPY;  Service: Endoscopy;  Laterality: N/A;  . CORONARY ANGIOPLASTY     stent placement x3  . ESOPHAGOGASTRODUODENOSCOPY N/A 09/19/2014   Procedure: ESOPHAGOGASTRODUODENOSCOPY (EGD);  Surgeon: Manya Silvas, MD;  Location: [facility] ENDOSCOPY;  Service: Endoscopy;  Laterality: N/A;  . IR PERC CHOLECYSTOSTOMY  02/09/2020  . IR RADIOLOGIST EVAL & MGMT  03/14/2020  . [place] right ear  . LUMBAR SPINAL CORD SIMULATOR LEAD REMOVAL Right 08/09/2019   Procedure: REMOVAL SPINAL CORD STIMULATOR PERCUTANEOUS LEADS, REMOVAL  PULSE GENERATOR;  Surgeon: [name], MD;  Location: [facility] ORS;  Service: Neurosurgery;  Laterality: Right;  LOCAL WITH MAC  . mastoid shunt Bilateral    left, 2002, 1997 right ear, 1980 right ear  . PULSE GENERATOR IMPLANT N/A 01/18/2019   Procedure: MEDTRONIC SPINAL CORD STIMULATOR BATTERY EXCHANGE;  Surgeon: [name], MD;  Location: [facility] ORS;  Service: Neurosurgery;  Laterality: N/A;  . ROBOT ASSISTED LAPAROSCOPIC NEPHRECTOMY Left 04/20/2020   Procedure: XI ROBOTIC ASSITED LAPAROSCOPIC NEPHRECTOMY;  Surgeon: Raynelle Bring, MD;  Location: [facility] ORS;  Service: Urology;  Laterality: Left;  . SAVORY DILATION N/A 09/19/2014   Procedure: SAVORY DILATION;  Surgeon: Manya Silvas, MD;  Location: [facility] ENDOSCOPY;  Service: Endoscopy;  Laterality: N/A;  . SHOULDER ARTHROSCOPY WITH SUBACROMIAL DECOMPRESSION Left 04/06/2012   Procedure: SHOULDER ARTHROSCOPY WITH SUBACROMIAL DECOMPRESSION;  Surgeon: Vickey Huger, MD;  Location: [place];  Service: Orthopedics;  Laterality: Left;  left shoulder arthroscopy, subacromial decompression and distal clavicle resection  . SPINAL CORD STIMULATOR IMPLANT Right     Current Hospital Medications:  Home Meds:  No current facility-administered medications on file prior to encounter.   Current Outpatient Medications on File Prior to Encounter  Medication Sig Dispense Refill  . ALPRAZolam (XANAX) 0.5 MG tablet Take 0.5 mg by mouth at bedtime.    Marland Kitchen aspirin EC 81 MG tablet Take 81 mg by mouth at bedtime. Swallow whole.    . cetirizine (ZYRTEC) 10 MG tablet Take 10 mg by mouth daily.     . cyclobenzaprine (FLEXERIL) 10 MG tablet Take 10 mg by mouth 3 (three) times daily as needed for muscle spasms.    . diazepam (VALIUM) 5 MG tablet Take 5 mg by mouth See admin instructions. Take 5 mg in the morning and additional if needed for Meniere's disease up to three a day    . diltiazem (CARDIZEM CD) 240 MG 24 hr capsule Take 240 mg by mouth daily.    Marland Kitchen docusate sodium (COLACE)  100 MG capsule Take 100 mg by mouth daily as needed for mild constipation.    . enalapril (VASOTEC) 10 MG tablet Take 10 mg by mouth 2 (two) times daily.     Marland Kitchen esomeprazole (NEXIUM) 40 MG capsule Take 40 mg by mouth 2 (two) times daily.    Marland Kitchen ezetimibe (ZETIA) 10 MG tablet Take 10 mg by mouth daily.    Marland Kitchen gabapentin (NEURONTIN) 800 MG tablet Take 1,600 mg by mouth 2 (two) times daily.    . Glucagon (BAQSIMI ONE PACK NA) Place 1 Dose into the nose daily as needed (severely low blood sugar).    Marland Kitchen HUMULIN R U-500 KWIKPEN 500 UNIT/ML kwikpen Inject 40 Units into the skin 3 (three) times daily with meals. (Patient taking differently: Inject 65-75 Units into the skin See admin instructions. Sliding scale  Can adjust according to blood sugar Inject 75 units with breakfast, 75 units with lunch, 65  units with dinner)  0  . hydrocortisone 2.5 % cream Apply 1 application topically daily as needed for hemorrhoids.    . isosorbide mononitrate (IMDUR) 30 MG 24 hr tablet Take 30 mg by mouth at bedtime.    . Magnesium Oxide 500 MG TABS Take 500 mg by mouth daily.    . meclizine (ANTIVERT) 25 MG tablet Take 25 mg by mouth See admin instructions. Take 25 mg in the morning, and additional 25 mg if needed up to a total of three per day for Meniere's disease    . metoprolol succinate (TOPROL-XL) 100 MG 24 hr tablet Take 100 mg by mouth 2 (two) times daily. Take with or immediately following a meal.    . montelukast (SINGULAIR) 10 MG tablet Take 10 mg by mouth at bedtime.     . niacin (NIASPAN) 500 MG CR tablet Take 500 mg by mouth at bedtime.    . Oxycodone HCl 10 MG TABS Take 1 tablet (10 mg total) by mouth every 6 (six) hours as needed. Must last 30 days 120 tablet 0  . [START ON 04/24/2020] Oxycodone HCl 10 MG TABS Take 1 tablet (10 mg total) by mouth every 6 (six) hours as needed. Must last 30 days 120 tablet 0  . [START ON 05/24/2020] Oxycodone HCl 10 MG TABS Take 1 tablet (10 mg total) by mouth every 6 (six) hours as  needed. Must last 30 days 120 tablet 0  . prasugrel (EFFIENT) 10 MG TABS tablet Take 10 mg by mouth daily.    . rosuvastatin (CRESTOR) 40 MG tablet Take 40 mg by mouth at bedtime.    . sodium chloride 0.9 % injection Flush the drain daily with 5 ml 70 mL 2     Scheduled Meds: . ALPRAZolam  0.5 mg Oral QHS  . aspirin EC  81 mg Oral QHS  . diazepam  5 mg Oral See admin instructions  . enoxaparin (LOVENOX) injection  40 mg Subcutaneous Q24H  . ezetimibe  10 mg Oral Daily  . gabapentin  1,600 mg Oral BID  . insulin aspart  0-20 Units Subcutaneous TID WC  . insulin aspart  6 Units Subcutaneous TID WC  . insulin glargine  40 Units Subcutaneous BID  . loratadine  10 mg Oral Daily  . Magnesium Oxide  500 mg Oral Daily  . meclizine  25 mg Oral See admin instructions  . montelukast  10 mg Oral QHS  . niacin  500 mg Oral QHS  . pantoprazole  80 mg Oral BID  . prasugrel  10 mg Oral Daily  . rosuvastatin  40 mg Oral QHS   Continuous Infusions: . lactated ringers 100 mL/hr at 04/23/20 0536   PRN Meds:.acetaminophen **OR** acetaminophen, cyclobenzaprine, docusate sodium, morphine injection, ondansetron **OR** ondansetron (ZOFRAN) IV  Allergies: No Known Allergies  Family History  Problem Relation Age of Onset  . Heart disease Mother   . Diabetes Mother   . Heart disease Father   . Cancer Sister   . Heart disease Maternal Aunt   . Heart disease Maternal Uncle   . Diabetes Maternal Grandmother   . Diabetes Paternal Grandmother     Social History:  reports that he has never smoked. He has never used smokeless tobacco. He reports that he does not drink alcohol and does not use drugs.  ROS: A complete review of systems was performed.  All systems are negative except for pertinent findings as noted.  Physical Exam:  Vital signs in  last 24 hours: Temp:  [98.6 F (37 C)-99.4 F (37.4 C)] 98.9 F (37.2 C) (03/06 0652) Pulse Rate:  [87-94] 93 (03/06 0652) Resp:  [20-29] 24 (03/06  0652) BP: (108-164)/(54-79) 123/58 (03/06 0652) SpO2:  [95 %-100 %] 97 % (03/06 0652) Weight:  [97.5 kg] 97.5 kg (03/06 0058) Constitutional:  Alert and oriented, No acute distress Cardiovascular: Regular rate and rhythm, No JVD Respiratory: Normal respiratory effort, Lungs clear bilaterally GI: Abdomen is soft, nontender, nondistended, no abdominal masses, Incisions C/D/I GU: No CVA tenderness Lymphatic: No lymphadenopathy Neurologic: Grossly intact, no focal deficits Psychiatric: Normal mood and affect  Laboratory Data:  Recent Labs    04/20/20 1532 04/20/20 1812 04/21/20 0403 04/22/20 0409 04/23/20 0145  WBC  --   --   --   --  9.8  HGB 13.3 13.3 13.0 11.6* 11.5*  HCT 39.0 40.6 38.8* 36.6* 36.5*  PLT  --   --   --   --  206    Recent Labs    04/20/20 1532 04/20/20 1812 04/21/20 0403 04/22/20 0409 04/23/20 0145  NA 141 138 134* 134* 137  K 3.9 4.1 4.8 4.6 4.5  CL  --  104 102 100 103  GLUCOSE  --  309* 399* 315* 371*  BUN  --  _0 24*  CALCIUM  --  9.0 8.9 9.1 9.5  CREATININE  --  1.16 1.60* 1.94* 2.07*     Results for orders placed or performed during the hospital encounter of 04/23/20 (from the past 24 hour(s))  Lactic acid, plasma     Status: None   Collection Time: 04/23/20  1:20 AM  Result Value Ref Range   Lactic Acid, Venous 1.1 0.5 - 1.9 mmol/L  Urinalysis, Routine w reflex microscopic Urine, Clean Catch     Status: Abnormal   Collection Time: 04/23/20  1:20 AM  Result Value Ref Range   Color, Urine STRAW (A) YELLOW   APPearance CLEAR CLEAR   Specific Gravity, Urine 1.012 1.005 - 1.030   pH 6.0 5.0 - 8.0   Glucose, UA >=500 (A) NEGATIVE mg/dL   Hgb urine dipstick SMALL (A) NEGATIVE   Bilirubin Urine NEGATIVE NEGATIVE   Ketones, ur NEGATIVE NEGATIVE mg/dL   Protein, ur NEGATIVE NEGATIVE mg/dL   Nitrite NEGATIVE NEGATIVE   Leukocytes,Ua NEGATIVE NEGATIVE   RBC / HPF 0-5 0 - 5 RBC/hpf   WBC, UA 0-5 0 - 5 WBC/hpf   Bacteria, UA NONE SEEN  NONE SEEN  Comprehensive metabolic panel     Status: Abnormal   Collection Time: 04/23/20  1:45 AM  Result Value Ref Range   Sodium 137 135 - 145 mmol/L   Potassium 4.5 3.5 - 5.1 mmol/L   Chloride 103 98 - 111 mmol/L   CO2 26 22 - 32 mmol/L   Glucose, Bld 371 (H) 70 - 99 mg/dL   BUN 24 (H) 8 - 23 mg/dL   Creatinine, Ser 2.07 (H) 0.61 - 1.24 mg/dL   Calcium 9.5 8.9 - 10.3 mg/dL   Total Protein 6.6 6.5 - 8.1 g/dL   Albumin 3.3 (L) 3.5 - 5.0 g/dL   AST 25 15 - 41 U/L   ALT 24 0 - 44 U/L   Alkaline Phosphatase 67 38 - 126 U/L   Total Bilirubin 0.5 0.3 - 1.2 mg/dL   GFR, Estimated 35 (L) >60 mL/min   Anion gap 8 5 - 15  Lipase, blood     Status: None  Collection Time: 04/23/20  1:45 AM  Result Value Ref Range   Lipase 22 11 - 51 U/L  CBC with Differential     Status: Abnormal   Collection Time: 04/23/20  1:45 AM  Result Value Ref Range   WBC 9.8 4.0 - 10.5 K/uL   RBC 4.18 (L) 4.22 - 5.81 MIL/uL   Hemoglobin 11.5 (L) 13.0 - 17.0 g/dL   HCT 36.5 (L) 39.0 - 52.0 %   MCV 87.3 80.0 - 100.0 fL   MCH 27.5 26.0 - 34.0 pg   MCHC 31.5 30.0 - 36.0 g/dL   RDW 14.2 11.5 - 15.5 %   Platelets 206 150 - 400 K/uL   nRBC 0.0 0.0 - 0.2 %   Neutrophils Relative % 68 %   Neutro Abs 6.7 1.7 - 7.7 K/uL   Lymphocytes Relative 18 %   Lymphs Abs 1.7 0.7 - 4.0 K/uL   Monocytes Relative 7 %   Monocytes Absolute 0.7 0.1 - 1.0 K/uL   Eosinophils Relative 6 %   Eosinophils Absolute 0.6 (H) 0.0 - 0.5 K/uL   Basophils Relative 0 %   Basophils Absolute 0.0 0.0 - 0.1 K/uL   Immature Granulocytes 1 %   Abs Immature Granulocytes 0.08 (H) 0.00 - 0.07 K/uL  Protime-INR     Status: None   Collection Time: 04/23/20  1:45 AM  Result Value Ref Range   Prothrombin Time 15.2 11.4 - 15.2 seconds   INR 1.2 0.8 - 1.2  Troponin I (High Sensitivity)     Status: None   Collection Time: 04/23/20  1:45 AM  Result Value Ref Range   Troponin I (High Sensitivity) 6 <18 ng/L  Lactic acid, plasma     Status: None    Collection Time: 04/23/20  3:20 AM  Result Value Ref Range   Lactic Acid, Venous 0.8 0.5 - 1.9 mmol/L  Troponin I (High Sensitivity)     Status: None   Collection Time: 04/23/20  3:20 AM  Result Value Ref Range   Troponin I (High Sensitivity) 6 <18 ng/L  Procalcitonin - Baseline     Status: None   Collection Time: 04/23/20  3:20 AM  Result Value Ref Range   Procalcitonin 1.22 ng/mL  Resp Panel by RT-PCR (Flu A&B, Covid) Nasopharyngeal Swab     Status: None   Collection Time: 04/23/20  5:17 AM   Specimen: Nasopharyngeal Swab; Nasopharyngeal(NP) swabs in vial transport medium  Result Value Ref Range   SARS Coronavirus 2 by RT PCR NEGATIVE NEGATIVE   Influenza A by PCR NEGATIVE NEGATIVE   Influenza B by PCR NEGATIVE NEGATIVE  Glucose, capillary     Status: Abnormal   Collection Time: 04/23/20  8:03 AM  Result Value Ref Range   Glucose-Capillary 248 (H) 70 - 99 mg/dL   Recent Results (from the past 240 hour(s))  SARS CORONAVIRUS 2 (TAT 6-24 HRS) Nasopharyngeal Nasopharyngeal Swab     Status: None   Collection Time: 04/17/20 10:52 AM   Specimen: Nasopharyngeal Swab  Result Value Ref Range Status   SARS Coronavirus 2 NEGATIVE NEGATIVE Final    Comment: (NOTE) SARS-CoV-2 target nucleic acids are NOT DETECTED.  The SARS-CoV-2 RNA is generally detectable in upper and lower respiratory specimens during the acute phase of infection. Negative results do not preclude SARS-CoV-2 infection, do not rule out co-infections with other pathogens, and should not be used as the sole basis for treatment or other patient management decisions. Negative results must be combined with  clinical observations, patient history, and epidemiological information. The expected result is Negative.  Fact Sheet for Patients: SugarRoll.be  Fact Sheet for Healthcare Providers: https://www.woods-mathews.com/  This test is not yet approved or cleared by the [place]  FDA and  has been authorized for detection and/or diagnosis of SARS-CoV-2 by FDA under an Emergency Use Authorization (EUA). This EUA will remain  in effect (meaning this test can be used) for the duration of the COVID-19 declaration under Se ction 564(b)(1) of the Act, 21 U.S.C. section 360bbb-3(b)(1), unless the authorization is terminated or revoked sooner.  Performed at [facility], [place] [address], [place], [place] [address]   Resp Panel by RT-PCR (Flu A&B, Covid) Nasopharyngeal Swab     Status: None   Collection Time: 04/23/20  5:17 AM   Specimen: Nasopharyngeal Swab; Nasopharyngeal(NP) swabs in vial transport medium  Result Value Ref Range Status   SARS Coronavirus 2 by RT PCR NEGATIVE NEGATIVE Final    Comment: (NOTE) SARS-CoV-2 target nucleic acids are NOT DETECTED.  The SARS-CoV-2 RNA is generally detectable in upper respiratory specimens during the acute phase of infection. The lowest concentration of SARS-CoV-2 viral copies this assay can detect is 138 copies/mL. A negative result does not preclude SARS-Cov-2 infection and should not be used as the sole basis for treatment or other patient management decisions. A negative result may occur with  improper specimen collection/handling, submission of specimen other than nasopharyngeal swab, presence of viral mutation(s) within the areas targeted by this assay, and inadequate number of viral copies(<138 copies/mL). A negative result must be combined with clinical observations, patient history, and epidemiological information. The expected result is Negative.  Fact Sheet for Patients:  EntrepreneurPulse.com.au  Fact Sheet for Healthcare Providers:  IncredibleEmployment.be  This test is no t yet approved or cleared by the [place] FDA and  has been authorized for detection and/or diagnosis of SARS-CoV-2 by FDA under an Emergency Use Authorization (EUA). This EUA will remain   in effect (meaning this test can be used) for the duration of the COVID-19 declaration under Section 564(b)(1) of the Act, 21 U.S.C.section 360bbb-3(b)(1), unless the authorization is terminated  or revoked sooner.       Influenza A by PCR NEGATIVE NEGATIVE Final   Influenza B by PCR NEGATIVE NEGATIVE Final    Comment: (NOTE) The Xpert Xpress SARS-CoV-2/FLU/RSV plus assay is intended as an aid in the diagnosis of influenza from Nasopharyngeal swab specimens and should not be used as a sole basis for treatment. Nasal washings and aspirates are unacceptable for Xpert Xpress SARS-CoV-2/FLU/RSV testing.  Fact Sheet for Patients: EntrepreneurPulse.com.au  Fact Sheet for Healthcare Providers: IncredibleEmployment.be  This test is not yet approved or cleared by the [place] FDA and has been authorized for detection and/or diagnosis of SARS-CoV-2 by FDA under an Emergency Use Authorization (EUA). This EUA will remain in effect (meaning this test can be used) for the duration of the COVID-19 declaration under Section 564(b)(1) of the Act, 21 U.S.C. section 360bbb-3(b)(1), unless the authorization is terminated or revoked.  Performed at [facility], [place] [address] [place], [place] [address]     Renal Function: Recent Labs    04/20/20 1812 04/21/20 0403 04/22/20 0409 04/23/20 0145  CREATININE 1.16 1.60* 1.94* 2.07*   Estimated Creatinine Clearance: 41 mL/min (A) (by C-G formula based on SCr of 2.07 mg/dL (H)).  Radiologic Imaging: CT ABDOMEN PELVIS WO CONTRAST  Result Date: 04/23/2020 CLINICAL DATA:  Status post nephrectomy and  cholecystectomy. Abdominal distension, rebound tenderness EXAM: CT ABDOMEN AND PELVIS WITHOUT CONTRAST TECHNIQUE: Multidetector CT imaging of the abdomen and pelvis was performed following the standard protocol without IV contrast. COMPARISON:  03/07/2020 FINDINGS: Lower chest: Small right  pleural effusion is present with compressive atelectasis of the right lower lobe. Mild left basilar atelectasis. Coronary artery stenting has been performed. Cardiac size within normal limits. Trace pericardial effusion is likely physiologic. Hepatobiliary: Status post cholecystectomy and removal of the cholecystostomy catheter. Poorly circumscribed fluid within the gallbladder fossa. No intra or extrahepatic biliary ductal dilation. Liver unremarkable. Pancreas: Unremarkable Spleen: Unremarkable Adrenals/Urinary Tract: Interval left nephrectomy with small loculated fluid collection within the nephrectomy bed measuring 3.6 x 6.3 cm. This abuts and obscures the left adrenal gland. Right adrenal gland and kidney are unremarkable. Bladder is unremarkable. Stomach/Bowel: Stomach, small bowel, and large bowel are unremarkable. Appendix absent. Trace perihepatic ascites. Punctate foci of free intraperitoneal gas within the abdomen noted anteriorly, likely postsurgical in nature. Subcutaneous gas within the anterior abdominal wall within the suprapubic region is also likely postsurgical in nature. Vascular/Lymphatic: Mild aortoiliac atherosclerotic calcification. No aortic aneurysm. No pathologic adenopathy within the abdomen and pelvis. Reproductive: Prostate is unremarkable. Other: Tiny fat containing umbilical hernia. Infiltration within the anterior abdominal wall may be postsurgical in nature and appears unchanged from prior examination. Rectum unremarkable. Musculoskeletal: No acute bone abnormality. IMPRESSION: Interval left nephrectomy with 6.3 cm loculated fluid collection within the nephrectomy bed. Interval cholecystectomy with poorly circumscribed fluid within the gallbladder fossa and trace perihepatic ascites. Punctate foci of gas within the peritoneum and subcutaneous gas within the suprapubic region within expectation given recent laparoscopic surgery. Small right pleural effusion with bibasilar  atelectasis. Aortic Atherosclerosis (ICD10-I70.0). Electronically Signed   By: Fidela Salisbury MD   On: 04/23/2020 02:30   DG Chest Port 1 View  Result Date: 04/23/2020 CLINICAL DATA:  Weakness, hypoxia EXAM: PORTABLE CHEST 1 VIEW COMPARISON:  02/13/2020 FINDINGS: Lung volumes are small and there is mild elevation of the right hemidiaphragm with asymmetric right basilar atelectasis. No superimposed focal pulmonary infiltrate. No pneumothorax or pleural effusion. Cardiac size within normal limits. Pulmonary vascularity normal. No acute bone abnormality. IMPRESSION: Pulmonary hypoinflation Electronically Signed   By: Fidela Salisbury MD   On: 04/23/2020 02:19    I independently reviewed the above imaging studies.  Impression/Recommendation 1. S/P left radical nephrectomy/cholecystectomy admitted with hypoxia s/p fall:  I appreciate help of Internal Medicine service with this medically complex patient and agree with plans for V-Q scan to help evaluate risk of PE.  He is not able to get CTA due to renal dysfunction.  Cr is relatively stable from yesterday and not unexpected considering his other medical problems and now s/p nephrectomy 3 days ago.  Expect this to stabilize and possibly improve over next couple of months.  I did review his CT scan and findings are consistent with expected postoperative findings.  He should continue PT/ambulation during hospitalization as he can tolerate.  Ok from a surgical standpoint (I had also confirmed this with Dr. Johney Maine last week) for him to restart his dual antiplatelet therapy (he has been on ASA 81 mg throughout his postoperative course).  Will continue to follow.  [name] 04/23/2020, 9:33 AM    Pryor Curia MD   CC: Dr. Jennette Kettle

## 2020-04-23 NOTE — Progress Notes (Signed)
PROGRESS NOTE    VESTAL MARKIN  WJX:[id number] DOB: 16-Apr-1955 DOA: 04/23/2020 PCP: [name], MD    Chief Complaint  Patient presents with  . Post-op Problem    Brief Narrative:  Patient 65 year old gentleman history of coronary artery disease, chronic pain syndrome on chronic opioids, diabetes, hypertension, hyperlipidemia who underwent laparoscopic cholecystectomy 04/20/2020 and while this was being performed underwent left nephrectomy for 6 cm renal mass which was performed by general surgery, Dr. Johney Maine (general surgery) and urology, Dr. Alinda Money.  Patient was discharged 04/22/2020 however when he went home attempted to walk to the bathroom to urinate, got incontinent with significant weakness with inability to ambulate and fell to the floor due to weakness.  Patient with some complaints of abdominal pain.  Patient noted by wife to have a fever with a temp of 101.4 and noted to be hypoxic with sats in the 70s, placed on oxygen and brought to the ED. Patient seen in the ED CT abdomen and pelvis with 6 cm loculated fluid collection in the renal fossa, and the abdomen within expected limits, small pleural effusions with atelectasis.  Patient noted to have a creatinine of 2.0 from 0.8 preop.  Chest x-ray done negative.  COVID-19 PCR negative.  Patient admitted for further evaluation and management.  Urology notified of admission and following.   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  Patient noted to have sats of 80% on room air at rest, while awake and talking on presentation with slightly increased respiratory rate.  Chest x-ray done negative. -It is noted per urology that during recent hospitalization she noted to be mildly hypoxic after ambulating and when sleeping but was 94 to 95% on room air when awake. -Patient does endorse a prior history of OSA and was on CPAP which was discontinued approximately 5 years ago. -VQ scan pending. -Check lower extremity Dopplers. -Incentive spirometry, flutter valve. -We will place on CPAP nightly. -Supportive care.  Follow.  2.  Acute kidney injury Creatinine elevated to 2.0 from 0.8 preoperatively.  Patient status post nephrectomy. -Patient seen by urology in consultation who feel elevated creatinine not unexpected and relatively stable since discharge.  Urology expecting renal function to stabilize and improve over the next couple of months.  Continue to hold Vasotec.  3.  Status post left radical nephrectomy (04/20/2020) Patient seen by urology.  Per urology.  4.  Chronic pain syndrome and opiate dependence -Resume home regimen oxycodone. -Continue IV as needed morphine. -Continue as needed Flexeril and scheduled Neurontin. -Placed on Senokot-S twice daily as well as MiraLAX daily.  5.  Diabetes mellitus type 2 -Creatinine noted at 2.0. -Metformin discontinued will likely not resume on discharge. -Was on U500 prior to admission. -CBG 248 this morning. -Continue current regimen of Lantus 40 units twice daily which was started this morning as well as sliding scale insulin and meal coverage NovoLog 6 units 3 times daily. -Diabetes coordinator following.  6.  Hypertension Blood pressure noted to be borderline on  presentation.  Blood pressure has improved.  Resume home regimen Cardizem and Toprol-XL.  Continue to hold Vasotec.  Follow.  7.  CAD Continue aspirin and Effient.  Resume home  regimen Toprol-XL and Cardizem.  Continue to hold Vasotec.  Continue statin.  Follow.  8.  Generalized weakness PT/OT.  9.  History of OSA Patient states was on CPAP 5 years ago which was discontinued.  Resume CPAP in-house.  Outpatient follow-up to reevaluate need for CPAP.    DVT prophylaxis: Lovenox Code Status: Full Family Communication: Updated patient and wife at bedside. Disposition:   Status is: Inpatient    Dispo: The patient is from: Home              Anticipated d/c is to: Home with home health versus SNF              Patient currently being evaluated for hypoxia, generalized weakness.  Not stable for discharge.   Difficult to place patient no       Consultants:   Urology: Dr. Alinda Money 04/23/2020  Procedures:   VQ scan pending  Lower extremity Dopplers pending  CT abdomen and pelvis 04/23/2020  Chest x-ray 04/23/2020    Antimicrobials:   None   Subjective: Patient laying in bed.  Denies any significant shortness of breath.  No chest pain.  States some diffuse abdominal discomfort as to be expected from recent surgery.  Stated was on the CPAP approximately 5 years ago which was subsequently discontinued as his PCP felt he did not need it.  Objective: Vitals:   04/23/20 0225 04/23/20 0315 04/23/20 0400 04/23/20 0652  BP: 120/79 (!) 143/72 116/63 (!) 123/58  Pulse: 94 90 88 93  Resp: (!) 29 (!) 27 (!) 28 (!) 24  Temp:    98.9 F (37.2 C)  TempSrc:    Oral  SpO2: 95% 96% 97% 97%  Weight:      Height:        Intake/Output Summary (Last 24 hours) at 04/23/2020 1125 Last data filed at 04/23/2020 0700 Gross per 24 hour  Intake --  Output 500 ml  Net -500 ml   Filed Weights   04/23/20 0058  Weight: 97.5 kg    Examination:  General exam: Appears calm and comfortable  Respiratory system: Clear to auscultation. Respiratory effort normal. Cardiovascular system: S1 & S2 heard, RRR. No JVD, murmurs, rubs, gallops or clicks. No pedal  edema. Gastrointestinal system: Abdomen is mildly distended, soft, some diffuse tenderness to palpation, positive bowel sounds.  Mid incision site C/D/I.  Port sites C/D/I.  Central nervous system: Alert and oriented. No focal neurological deficits. Extremities: Symmetric 5 x 5 power. Skin: No rashes, lesions or ulcers Psychiatry: Judgement and insight appear normal. Mood & affect appropriate.     Data Reviewed: I have personally reviewed following labs and imaging studies  CBC: Recent Labs  Lab 04/20/20 1532 04/20/20 1812 04/21/20 0403 04/22/20 0409 04/23/20 0145  WBC  --   --   --   --  9.8  NEUTROABS  --   --   --   --  6.7  HGB 13.3 13.3 13.0 11.6* 11.5*  HCT 39.0 40.6 38.8* 36.6* 36.5*  MCV  --   --   --   --  87.3  PLT  --   --   --   --  993    Basic Metabolic Panel: Recent Labs  Lab 04/20/20 1532 04/20/20 1812 04/21/20 0403 04/22/20 0409 04/23/20 0145  NA 141 138 134* 134* 137  K 3.9 4.1 4.8 4.6 4.5  CL  --  104 102 100 103  CO2  --  21* 24 30 26   GLUCOSE  --  309* 399* 315* 371*  BUN  --  12 17 20  24*  CREATININE  --  1.16 1.60* 1.94* 2.07*  CALCIUM  --  9.0 8.9 9.1 9.5    GFR: Estimated Creatinine Clearance: 41 mL/min (A) (by C-G formula based on SCr of 2.07 mg/dL (H)).  Liver Function Tests: Recent Labs  Lab 04/23/20 0145  AST 25  ALT 24  ALKPHOS 67  BILITOT 0.5  PROT 6.6  ALBUMIN 3.3*    CBG: Recent Labs  Lab 04/22/20 0043 04/22/20 0440 04/22/20 0737 04/22/20 1147 04/23/20 0803  GLUCAP 285* 275* 251* 295* 248*     Recent Results (from the past 240 hour(s))  SARS CORONAVIRUS 2 (TAT 6-24 HRS) Nasopharyngeal Nasopharyngeal Swab     Status: None   Collection Time: 04/17/20 10:52 AM   Specimen: Nasopharyngeal Swab  Result Value Ref Range Status   SARS Coronavirus 2 NEGATIVE NEGATIVE Final    Comment: (NOTE) SARS-CoV-2 target nucleic acids are NOT DETECTED.  The SARS-CoV-2 RNA is generally detectable in upper and  lower respiratory specimens during the acute phase of infection. Negative results do not preclude SARS-CoV-2 infection, do not rule out co-infections with other pathogens, and should not be used as the sole basis for treatment or other patient management decisions. Negative results must be combined with clinical observations, patient history, and epidemiological information. The expected result is Negative.  Fact Sheet for Patients: SugarRoll.be  Fact Sheet for Healthcare Providers: https://www.woods-mathews.com/  This test is not yet approved or cleared by the [place] FDA and  has been authorized for detection and/or diagnosis of SARS-CoV-2 by FDA under an Emergency Use Authorization (EUA). This EUA will remain  in effect (meaning this test can be used) for the duration of the COVID-19 declaration under Se ction 564(b)(1) of the Act, 21 U.S.C. section 360bbb-3(b)(1), unless the authorization is terminated or revoked sooner.  Performed at [facility], [place] [address] [place], [place] [address]   Resp Panel by RT-PCR (Flu A&B, Covid) Nasopharyngeal Swab     Status: None   Collection Time: 04/23/20  5:17 AM   Specimen: Nasopharyngeal Swab; Nasopharyngeal(NP) swabs in vial transport medium  Result Value Ref Range Status   SARS Coronavirus 2 by RT PCR NEGATIVE NEGATIVE Final    Comment: (NOTE) SARS-CoV-2 target nucleic acids are NOT DETECTED.  The SARS-CoV-2 RNA is generally detectable in upper respiratory specimens during the acute phase of infection. The lowest concentration of SARS-CoV-2 viral copies this assay can detect is 138 copies/mL. A negative result does not preclude SARS-Cov-2 infection and should not be used as the sole basis for treatment or other patient management decisions. A negative result may occur with  improper specimen collection/handling, submission of specimen other than nasopharyngeal swab, presence of  viral mutation(s) within the areas targeted by this assay, and inadequate number of viral copies(<138 copies/mL). A negative result must be combined with clinical observations, patient history, and epidemiological information. The expected result is Negative.  Fact Sheet for Patients:  EntrepreneurPulse.com.au  Fact Sheet for Healthcare Providers:  IncredibleEmployment.be  This test is no t yet approved or cleared by the [place] FDA and  has been authorized for detection and/or diagnosis of SARS-CoV-2 by FDA under an Emergency Use Authorization (EUA). This EUA will  remain  in effect (meaning this test can be used) for the duration of the COVID-19 declaration under Section 564(b)(1) of the Act, 21 U.S.C.section 360bbb-3(b)(1), unless the authorization is terminated  or revoked sooner.       Influenza A by PCR NEGATIVE NEGATIVE Final   Influenza B by PCR NEGATIVE NEGATIVE Final    Comment: (NOTE) The Xpert Xpress SARS-CoV-2/FLU/RSV plus assay is intended as an aid in the diagnosis of influenza from Nasopharyngeal swab specimens and should not be used as a sole basis for treatment. Nasal washings and aspirates are unacceptable for Xpert Xpress SARS-CoV-2/FLU/RSV testing.  Fact Sheet for Patients: EntrepreneurPulse.com.au  Fact Sheet for Healthcare Providers: IncredibleEmployment.be  This test is not yet approved or cleared by the [place] FDA and has been authorized for detection and/or diagnosis of SARS-CoV-2 by FDA under an Emergency Use Authorization (EUA). This EUA will remain in effect (meaning this test can be used) for the duration of the COVID-19 declaration under Section 564(b)(1) of the Act, 21 U.S.C. section 360bbb-3(b)(1), unless the authorization is terminated or revoked.  Performed at [facility], [place] [address], [place], [place] [address]           Radiology Studies: CT ABDOMEN PELVIS WO CONTRAST  Result Date: 04/23/2020 CLINICAL DATA:  Status post nephrectomy and cholecystectomy. Abdominal distension, rebound tenderness EXAM: CT ABDOMEN AND PELVIS WITHOUT CONTRAST TECHNIQUE: Multidetector CT imaging of the abdomen and pelvis was performed following the standard protocol without IV contrast. COMPARISON:  03/07/2020 FINDINGS: Lower chest: Small right pleural effusion is present with compressive atelectasis of the right lower lobe. Mild left basilar atelectasis. Coronary artery stenting has been performed. Cardiac size within normal limits. Trace pericardial effusion is likely physiologic. Hepatobiliary: Status post cholecystectomy and removal of the cholecystostomy catheter. Poorly circumscribed fluid within the gallbladder fossa. No intra or extrahepatic biliary ductal dilation. Liver unremarkable. Pancreas: Unremarkable Spleen: Unremarkable Adrenals/Urinary Tract: Interval left nephrectomy with small loculated fluid collection within the nephrectomy bed measuring 3.6 x 6.3 cm. This abuts and obscures the left adrenal gland. Right adrenal gland and kidney are unremarkable. Bladder is unremarkable. Stomach/Bowel: Stomach, small bowel, and large bowel are unremarkable. Appendix absent. Trace perihepatic ascites. Punctate foci of free intraperitoneal gas within the abdomen noted anteriorly, likely postsurgical in nature. Subcutaneous gas within the anterior abdominal wall within the suprapubic region is also likely postsurgical in nature. Vascular/Lymphatic: Mild aortoiliac atherosclerotic calcification. No aortic aneurysm. No pathologic adenopathy within the abdomen and pelvis. Reproductive: Prostate is unremarkable. Other: Tiny fat containing umbilical hernia. Infiltration within the anterior abdominal wall may be postsurgical in nature and appears unchanged from prior examination. Rectum unremarkable. Musculoskeletal: No acute bone abnormality.  IMPRESSION: Interval left nephrectomy with 6.3 cm loculated fluid collection within the nephrectomy bed. Interval cholecystectomy with poorly circumscribed fluid within the gallbladder fossa and trace perihepatic ascites. Punctate foci of gas within the peritoneum and subcutaneous gas within the suprapubic region within expectation given recent laparoscopic surgery. Small right pleural effusion with bibasilar atelectasis. Aortic Atherosclerosis (ICD10-I70.0). Electronically Signed   By: Fidela Salisbury MD   On: 04/23/2020 02:30   DG Chest Port 1 View  Result Date: 04/23/2020 CLINICAL DATA:  Weakness, hypoxia EXAM: PORTABLE CHEST 1 VIEW COMPARISON:  02/13/2020 FINDINGS: Lung volumes are small and there is mild elevation of the right hemidiaphragm with asymmetric right basilar atelectasis. No superimposed focal pulmonary infiltrate. No pneumothorax or pleural effusion. Cardiac size within normal limits. Pulmonary vascularity normal. No acute bone abnormality. IMPRESSION:  Pulmonary hypoinflation Electronically Signed   By: Fidela Salisbury MD   On: 04/23/2020 02:19        Scheduled Meds: . ALPRAZolam  0.5 mg Oral QHS  . aspirin EC  81 mg Oral QHS  . enoxaparin (LOVENOX) injection  40 mg Subcutaneous Q24H  . ezetimibe  10 mg Oral Daily  . gabapentin  1,600 mg Oral BID  . insulin aspart  0-20 Units Subcutaneous TID WC  . insulin aspart  6 Units Subcutaneous TID WC  . insulin glargine  40 Units Subcutaneous BID  . [START ON 04/24/2020] loratadine  10 mg Oral Daily  . magnesium oxide  400 mg Oral Daily  . montelukast  10 mg Oral QHS  . niacin  500 mg Oral QHS  . pantoprazole  80 mg Oral BID  . prasugrel  10 mg Oral Daily  . rosuvastatin  40 mg Oral QHS   Continuous Infusions: . lactated ringers 100 mL/hr at 04/23/20 0536     LOS: 0 days    Time spent: 35 minutes  No charge    Irine Seal, MD [facility]   To contact the attending provider between 7A-7P or the covering  provider during after hours 7P-7A, please log into the web site [email or web address] and access using universal [place] password for that web site. If you do not have the password, please call the hospital operator.  04/23/2020, 11:25 AM

## 2020-04-23 NOTE — ED Provider Notes (Signed)
[facility] DEPT Provider Note   CSN: [id number] Arrival date & time: 04/23/20  0047     History Chief Complaint  Patient presents with  . Post-op Problem    Kenneth Osborn is a 65 y.o. male.  Patient is a 65 year old male with extensive past medical history including coronary artery disease with stents, prior back surgery with chronic pain, diabetes, hypertension, hyperlipidemia.  Patient also discharged from the hospital yesterday after being admitted and undergoing cholecystectomy, wedge resection of the liver, and nephrectomy secondary to renal mass.  This was performed by Dr. Alinda Money from urology and Dr. Johney Maine from general surgery.  This evening, patient attempted to walk to the bathroom to urinate.  He was incontinent prior to getting to the bathroom, then once arriving there, was unable to walk back to bed.  He actually fell to the floor because he was so weak.  Is complaining of abdominal pain.  Wife tells me she checked his temperature and it was 101.4.  Patient was found to be hypoxic and placed on oxygen.  Patient describes ongoing abdominal pain and urinary frequency.  The history is provided by the patient.       Past Medical History:  Diagnosis Date  . Acute postoperative pain 12/03/2018  . Allergic rhinitis 12/30/2012  . Anginal pain ([place])   . Anxiety   . Bronchitis    hx of  . Can't get food down 08/12/2014  . Cancer ([place]) 01/2020   kidney   . Chronic back pain    thoracic area  . Concussion 09/2015  . Coronary artery disease    99% blockage  . DDD (degenerative disc disease), cervical   . Dehydration symptoms    2019  . 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 Dysphagia   . GERD (gastroesophageal reflux disease)   . History of Meniere's disease 12/21/2014  . History of thoracic spine surgery (S/P T9-10 IVD spacer) 12/21/2014  . Hypercholesteremia   . Hyperlipidemia   . Hypertension    sees  Dr. Jenny Reichmann walker Jefm Bryant  . 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 Myocardial infarction [facility]) 2012   Sees Dr. Drema Dallas, [facility]  . Neuromuscular disorder ([place])    diabetic neuropathy in feet  . Severe sepsis ([place]) 02/07/2020  . Short-segment Barrett's esophagus   . Sleep apnea    mild    Patient Active Problem List   Diagnosis Date Noted  . 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 Acute on chronic cholecystitis s/p robotic cholecystectomy 04/20/2020 04/20/2020  . Elevated LFTs   . Renal mass   . Hypokalemia   . Chronic low back pain (Bilateral) w/o sciatica 12/16/2019  . Hyperglycemia 12/16/2019  . Pharmacologic therapy 08/03/2019  . Disorder of skeletal system 08/03/2019  . Problems influencing health status 08/03/2019  . Postop check 12/17/2018  . Acute postoperative pain 12/03/2018  . DDD (degenerative disc disease), lumbosacral 09/21/2018  . Diabetic polyneuropathy associated with diabetes mellitus due to underlying condition ([place]) 09/10/2018  . Neuropathic pain 09/10/2018  . Musculoskeletal pain 09/10/2018  . AKI (acute kidney injury) ([place]) 02/19/2018  . Frequent falls 02/07/2018  . Hyperkalemia 01/18/2018  . Spondylosis without myelopathy or radiculopathy, lumbosacral region 11/13/2017  . Cardiac syncope 09/08/2017  . History of coronary artery disease 09/03/2017  . Spinal cord stimulator dysfunction ([place]) 12/26/2016  . Trigger point posterior superior iliac spine (PSIS) (  Right) 12/25/2016  . 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 Polyneuropathy 12/18/2016  . Numbness and tingling 11/12/2016  . Tachycardia with heart rate 100-120 beats per minute 11/12/2016  . Meniere disease, bilateral 11/12/2016  . Headache disorder 11/12/2016  . Type 2 diabetes mellitus with diabetic neuropathy ([place]) 11/12/2016  . Type 2 diabetes mellitus with diabetic polyneuropathy, with long-term  current use of insulin ([place]) 11/12/2016  . Tremor 09/11/2016  . Chronic pain syndrome 05/28/2016  . Bilateral carotid artery stenosis 01/24/2016  . Mild cognitive impairment 11/27/2015  . 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 Balance problem 10/09/2015  . Chronic tension-type headache, not intractable 10/09/2015  . Dizziness 10/09/2015  . Post-concussion headache 10/09/2015  . PSVT (paroxysmal supraventricular tachycardia) ([place]) 08/31/2015  . Diabetes mellitus, type II ([place]) 06/14/2015  . Unstable angina ([place]) 06/08/2015  . Encounter for interrogation of neurostimulator 03/22/2015  . [employer] spinal cord stimulator (implant date: 11/01/2014) 03/22/2015  . Type 2 diabetes mellitus with hyperglycemia ([place]) 03/15/2015  . Uncontrolled type 2 diabetes mellitus with hyperglycemia, with long-term current use of insulin ([place]) 03/15/2015  . DM type 2 with diabetic peripheral neuropathy ([place]) 03/15/2015  . Acid reflux 12/21/2014  . OSA (obstructive sleep apnea) 12/21/2014  . S/P insertion of spinal cord stimulator 12/21/2014  . Chronic low back pain (Bilateral) (L>R) w/ sciatica (Left) 12/21/2014  . 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 Beverely Pace at [facility] on 01/04/2013) 12/21/2014  . Long term current use of opiate analgesic 12/21/2014  . Long term prescription opiate use 12/21/2014  . Opiate use (60 MME/Day) 12/21/2014  . Opiate dependence ([place]) 12/21/2014  . Encounter for therapeutic drug level monitoring 12/21/2014  . Neurogenic pain 12/21/2014  . Thoracic facet syndrome (T8-10) 12/21/2014  . Lumbar facet syndrome (Bilateral) (R>L) 12/21/2014  . Cervical facet syndrome (Right) 12/21/2014  . Cervical spondylosis 12/21/2014  . Lumbar spondylosis 12/21/2014  . Chronic upper extremity pain (Left) 12/21/2014  . Chronic cervical radicular pain (Left) 12/21/2014  . Chronic upper back pain 12/21/2014  . History of thoracic spine surgery (S/P  T9-10 IVD spacer) 12/21/2014  . 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 Chronic musculoskeletal pain 12/21/2014  . Myofascial pain 12/21/2014  . Chronic lower extremity pain (Left) 12/21/2014  . Chronic radicular lumbar pain (left L4 dermatomal pain) 12/21/2014  . Coronary artery disease 12/21/2014  . History of MI (myocardial infarction) (May 2012) 12/21/2014  . Chronic anticoagulation ([place]) 12/21/2014  . History of Meniere's disease 12/21/2014  . Sleep apnea 12/21/2014  . History of spinal surgery 11/14/2014  . CKD (chronic kidney disease) stage 3, GFR 30-59 ml/min (HCC) 09/26/2014  . Hypotension due to hypovolemia 09/22/2014  . Benign essential HTN 09/14/2014  . Hypertension associated with diabetes ([place]) 09/14/2014  . Barrett esophagus 08/12/2014  . H/O adenomatous polyp of colon 08/12/2014  . Dysphagia 08/12/2014  . GERD (gastroesophageal reflux disease) 01/25/2014  . Arteriosclerosis of coronary artery 08/13/2013  . Hyperlipidemia 08/13/2013  . CAD (coronary artery disease) 08/13/2013  . Hyperlipidemia associated with type 2 diabetes mellitus ([place]) 08/13/2013  . Auditory vertigo 12/30/2012  . Cervical spinal stenosis 12/11/2012  . Arthralgia of shoulder 03/16/2012    Past Surgical History:  Procedure Laterality Date  . APPENDECTOMY     age 41  . ARTHRODESIS ANTERIOR ANTERIOR CERVICLE SPINE  01/04/2013  . BACK SURGERY     fusion thoracic area  . CARDIAC CATHETERIZATION     may 2012  and Nov 20, 2010  . CARDIAC CATHETERIZATION N/A 06/15/2015   Procedure: Left Heart Cath and Coronary Angiography;  Surgeon: Corey Skains, MD;  Location: [facility];  Service: Cardiovascular;  Laterality: N/A;  . CARDIAC CATHETERIZATION N/A 06/15/2015   Procedure: Coronary Stent Intervention;  Surgeon: Isaias Cowman, MD;  Location: [facility];  Service: Cardiovascular;  Laterality: N/A;  . CARDIOVASCULAR STRESS TEST     jan 2014  . COLONOSCOPY WITH PROPOFOL  N/A 09/19/2014   Procedure: COLONOSCOPY WITH PROPOFOL;  Surgeon: Manya Silvas, MD;  Location: [facility] ENDOSCOPY;  Service: Endoscopy;  Laterality: N/A;  . CORONARY ANGIOPLASTY     stent placement x3  . ESOPHAGOGASTRODUODENOSCOPY N/A 09/19/2014   Procedure: ESOPHAGOGASTRODUODENOSCOPY (EGD);  Surgeon: Manya Silvas, MD;  Location: [facility] ENDOSCOPY;  Service: Endoscopy;  Laterality: N/A;  . IR PERC CHOLECYSTOSTOMY  02/09/2020  . IR RADIOLOGIST EVAL & MGMT  03/14/2020  . [place] right ear  . LUMBAR SPINAL CORD SIMULATOR LEAD REMOVAL Right 08/09/2019   Procedure: REMOVAL SPINAL CORD STIMULATOR PERCUTANEOUS LEADS, REMOVAL PULSE GENERATOR;  Surgeon: [name], MD;  Location: [facility] ORS;  Service: Neurosurgery;  Laterality: Right;  LOCAL WITH MAC  . mastoid shunt Bilateral    left, 2002, 1997 right ear, 1980 right ear  . PULSE GENERATOR IMPLANT N/A 01/18/2019   Procedure: [employer] SPINAL CORD STIMULATOR BATTERY EXCHANGE;  Surgeon: [name], MD;  Location: [facility] ORS;  Service: Neurosurgery;  Laterality: N/A;  . ROBOT ASSISTED LAPAROSCOPIC NEPHRECTOMY Left 04/20/2020   Procedure: XI ROBOTIC ASSITED LAPAROSCOPIC NEPHRECTOMY;  Surgeon: Raynelle Bring, MD;  Location: [facility] ORS;  Service: Urology;  Laterality: Left;  . SAVORY DILATION N/A 09/19/2014   Procedure: SAVORY DILATION;  Surgeon: Manya Silvas, MD;  Location: [facility] ENDOSCOPY;  Service: Endoscopy;  Laterality: N/A;  . SHOULDER ARTHROSCOPY WITH SUBACROMIAL DECOMPRESSION Left 04/06/2012   Procedure: SHOULDER ARTHROSCOPY WITH SUBACROMIAL DECOMPRESSION;  Surgeon: Vickey Huger, MD;  Location: [facility];  Service: Orthopedics;  Laterality: Left;  left shoulder arthroscopy, subacromial decompression and distal clavicle resection  . SPINAL CORD STIMULATOR IMPLANT Right        Family History  Problem Relation Age of Onset  . Heart disease Mother   . Diabetes Mother   . Heart disease Father   . Cancer Sister   . Heart disease Maternal Aunt    . Heart disease Maternal Uncle   . Diabetes Maternal Grandmother   . Diabetes Paternal Grandmother     Social History   Tobacco Use  . Smoking status: Never Smoker  . Smokeless tobacco: Never Used  Vaping Use  . Vaping Use: Never used  Substance Use Topics  . Alcohol use: No  . Drug use: No    Home Medications Prior to Admission medications   Medication Sig Start Date End Date Taking? Authorizing Provider  ALPRAZolam Duanne Moron) 0.5 MG tablet Take 0.5 mg by mouth at bedtime.    [provider]  aspirin EC 81 MG tablet Take 81 mg by mouth at bedtime. Swallow whole.    [provider]  cetirizine (ZYRTEC) 10 MG tablet Take 10 mg by mouth daily.     [provider]  cyclobenzaprine (FLEXERIL) 10 MG tablet Take 10 mg by mouth 3 (three) times daily as needed for muscle spasms.    [provider]  diazepam (VALIUM) 5 MG tablet Take 5 mg by mouth See admin instructions. Take 5 mg in the morning and additional  if needed for Meniere's disease up to three a day    [provider]  diltiazem (CARDIZEM CD) 240 MG 24 hr capsule Take 240 mg by mouth daily. 03/30/18   [provider]  docusate sodium (COLACE) 100 MG capsule Take 100 mg by mouth daily as needed for mild constipation.    [provider]  enalapril (VASOTEC) 10 MG tablet Take 10 mg by mouth 2 (two) times daily.  07/28/18   [provider]  esomeprazole (NEXIUM) 40 MG capsule Take 40 mg by mouth 2 (two) times daily.    [provider]  ezetimibe (ZETIA) 10 MG tablet Take 10 mg by mouth daily. 03/26/17   [provider]  gabapentin (NEURONTIN) 800 MG tablet Take 1,600 mg by mouth 2 (two) times daily.    [provider]  Glucagon (BAQSIMI ONE PACK NA) Place 1 Dose into the nose daily as needed (severely low blood sugar).    [provider]  HUMULIN R U-500 KWIKPEN 500 UNIT/ML kwikpen Inject 40 Units into the skin 3 (three) times daily with  meals. Patient taking differently: Inject 65-75 Units into the skin See admin instructions. Sliding scale  Can adjust according to blood sugar Inject 75 units with breakfast, 75 units with lunch, 65 units with dinner 01/19/18   Max Sane, MD  hydrocortisone 2.5 % cream Apply 1 application topically daily as needed for hemorrhoids. 02/22/20   [provider]  isosorbide mononitrate (IMDUR) 30 MG 24 hr tablet Take 30 mg by mouth at bedtime.    [provider]  Magnesium Oxide 500 MG TABS Take 500 mg by mouth daily.    [provider]  meclizine (ANTIVERT) 25 MG tablet Take 25 mg by mouth See admin instructions. Take 25 mg in the morning, and additional 25 mg if needed up to a total of three per day for Meniere's disease 07/06/15   [provider]  metFORMIN (GLUCOPHAGE) 1000 MG tablet Take 1,000 mg by mouth 2 (two) times daily with a meal. 03/06/18   [provider]  metoprolol succinate (TOPROL-XL) 100 MG 24 hr tablet Take 100 mg by mouth 2 (two) times daily. Take with or immediately following a meal.    [provider]  montelukast (SINGULAIR) 10 MG tablet Take 10 mg by mouth at bedtime.     [provider]  niacin (NIASPAN) 500 MG CR tablet Take 500 mg by mouth at bedtime.    [provider]  Oxycodone HCl 10 MG TABS Take 1 tablet (10 mg total) by mouth every 6 (six) hours as needed. Must last 30 days 03/25/20 04/24/20  Milinda Pointer, MD  Oxycodone HCl 10 MG TABS Take 1 tablet (10 mg total) by mouth every 6 (six) hours as needed. Must last 30 days 04/24/20 05/24/20  Milinda Pointer, MD  Oxycodone HCl 10 MG TABS Take 1 tablet (10 mg total) by mouth every 6 (six) hours as needed. Must last 30 days 05/24/20 06/23/20  Milinda Pointer, MD  prasugrel (EFFIENT) 10 MG TABS tablet Take 10 mg by mouth daily. 11/22/19   [provider]  rosuvastatin (CRESTOR) 40 MG tablet Take 40 mg by mouth at bedtime.    [provider]   sodium chloride 0.9 % injection Flush the drain daily with 5 ml 03/10/20   Olean Ree, MD    Allergies    Patient has no known allergies.  Review of Systems   Review of Systems  All other systems  reviewed and are negative.   Physical Exam Updated Vital Signs BP (!) 164/76 (BP Location: Right Arm)   Pulse 94   Temp 99.4 F (37.4 C) (Oral)   Ht _0  (1.753 m)   Wt 97.5 kg   SpO2 100%   BMI 31.75 kg/m   Physical Exam Vitals and nursing note reviewed.  Constitutional:      General: He is not in acute distress.    Appearance: He is well-developed and well-nourished. He is not diaphoretic.  HENT:     Head: Normocephalic and atraumatic.     Mouth/Throat:     Mouth: Oropharynx is clear and moist.  Cardiovascular:     Rate and Rhythm: Normal rate and regular rhythm.     Heart sounds: No murmur heard. No friction rub.  Pulmonary:     Effort: Pulmonary effort is normal. No respiratory distress.     Breath sounds: Normal breath sounds. No wheezing or rales.  Abdominal:     General: Bowel sounds are normal. There is no distension.     Palpations: Abdomen is soft.     Tenderness: There is abdominal tenderness. There is no right CVA tenderness, left CVA tenderness, guarding or rebound.     Comments: Abdomen is somewhat protuberant.  He is obese with tenderness to all 4 quadrants.  There is no rebound or guarding.  Patient surgical incisions appear intact with no drainage or surrounding erythema.  Musculoskeletal:        General: No edema. Normal range of motion.     Cervical back: Normal range of motion and neck supple.  Skin:    General: Skin is warm and dry.  Neurological:     Mental Status: He is alert and oriented to person, place, and time.     Coordination: Coordination normal.     ED Results / Procedures / Treatments   Labs (all labs ordered are listed, but only abnormal results are displayed) Labs Reviewed  COMPREHENSIVE METABOLIC PANEL  LIPASE, BLOOD  CBC  WITH DIFFERENTIAL/PLATELET  PROTIME-INR  LACTIC ACID, PLASMA  LACTIC ACID, PLASMA  URINALYSIS, ROUTINE W REFLEX MICROSCOPIC  TROPONIN I (HIGH SENSITIVITY)    EKG EKG Interpretation  Date/Time:  Sunday April 23 2020 01:32:25 EST Ventricular Rate:  93 PR Interval:    QRS Duration: 99 QT Interval:  337 QTC Calculation: 420 R Axis:   102 Text Interpretation: Sinus rhythm Right axis deviation No significant change since 04/11/2020 Confirmed by Veryl Speak [phone]) on 04/23/2020 1:39:08 AM   Radiology No results found.  Procedures Procedures   Medications Ordered in ED Medications  sodium chloride 0.9 % bolus 500 mL (has no administration in time range)    ED Course  I have reviewed the triage vital signs and the nursing notes.  Pertinent labs & imaging results that were available during my care of the patient were reviewed by me and considered in my medical decision making (see chart for details).    MDM Rules/Calculators/A&P  Patient is a 65 year old male with recent admission for nephrectomy and cholecystectomy.  He presents today for evaluation of weakness and fall.  Patient incontinent of urine at home and felt extremely weak attempting to walk to the bathroom.  He actually fell onto the floor and wife had trouble getting him up.  She stated that he was febrile at home, but is afebrile here.  Patient arrives with stable vital signs, but is hypoxic with significant oxygen requirement.  I have told his initial  saturations were in the 70s on room air.  When I evaluated him he was on 6 L nasal cannula saturating 100%.  This was turned off and patient did desaturate into the mid 80s.  He was then placed on 2 L nasal cannula and maintain saturations in the mid 90s.  Patient's laboratory studies are mostly reassuring.  His chest x-ray is clear.  Patient did undergo a CT scan of the abdomen and pelvis without contrast which showed postsurgical changes, but nothing that appears acute.   The CT findings were discussed with Dr. Alinda Money from urology who does not feel as though any immediate action is necessary, but will follow the patient in the hospital.  Patient to be admitted to the hospitalist service under the care of Dr. Alcario Drought.  I am uncertain as to the etiology of the patient's hypoxia, however pulmonary embolism is possible and will likely require a VQ scan given the patient's recent nephrectomy.  I do not feel as though he is a good candidate to receive IV contrast due to the solitary kidney.  Also a possible is hypoventilation due to narcotic pain medication.  This will be monitored in the hospital.  CRITICAL CARE Performed by: Veryl Speak Total critical care time: 45 minutes Critical care time was exclusive of separately billable procedures and treating other patients. Critical care was necessary to treat or prevent imminent or life-threatening deterioration. Critical care was time spent personally by me on the following activities: development of treatment plan with patient and/or surrogate as well as nursing, discussions with consultants, evaluation of patient's response to treatment, examination of patient, obtaining history from patient or surrogate, ordering and performing treatments and interventions, ordering and review of laboratory studies, ordering and review of radiographic studies, pulse oximetry and re-evaluation of patient's condition.   Final Clinical Impression(s) / ED Diagnoses Final diagnoses:  None    Rx / DC Orders ED Discharge Orders    None       Veryl Speak, MD 04/23/20 [phone]

## 2020-04-24 ENCOUNTER — Encounter (HOSPITAL_COMMUNITY): Payer: Medicare Other

## 2020-04-24 DIAGNOSIS — K5900 Constipation, unspecified: Secondary | ICD-10-CM

## 2020-04-24 DIAGNOSIS — I10 Essential (primary) hypertension: Secondary | ICD-10-CM | POA: Diagnosis not present

## 2020-04-24 DIAGNOSIS — R0902 Hypoxemia: Secondary | ICD-10-CM | POA: Diagnosis not present

## 2020-04-24 DIAGNOSIS — N179 Acute kidney failure, unspecified: Secondary | ICD-10-CM | POA: Diagnosis not present

## 2020-04-24 DIAGNOSIS — R509 Fever, unspecified: Secondary | ICD-10-CM

## 2020-04-24 DIAGNOSIS — J9601 Acute respiratory failure with hypoxia: Secondary | ICD-10-CM | POA: Diagnosis not present

## 2020-04-24 LAB — BASIC METABOLIC PANEL
Anion gap: 7 (ref 5–15)
BUN: 13 mg/dL (ref 8–23)
CO2: 26 mmol/L (ref 22–32)
Calcium: 9.6 mg/dL (ref 8.9–10.3)
Chloride: 102 mmol/L (ref 98–111)
Creatinine, Ser: 1.52 mg/dL — ABNORMAL HIGH (ref 0.61–1.24)
GFR, Estimated: 51 mL/min — ABNORMAL LOW (ref >60)
Glucose, Bld: 219 mg/dL — ABNORMAL HIGH (ref 70–99)
Potassium: 4.3 mmol/L (ref 3.5–5.1)
Sodium: 135 mmol/L (ref 135–145)

## 2020-04-24 LAB — CBC WITH DIFFERENTIAL/PLATELET
Abs Immature Granulocytes: 0.02 10*3/uL (ref 0.00–0.07)
Basophils Absolute: 0 10*3/uL (ref 0.0–0.1)
Basophils Relative: 0 %
Eosinophils Absolute: 0.4 10*3/uL (ref 0.0–0.5)
Eosinophils Relative: 7 %
HCT: 35.7 % — ABNORMAL LOW (ref 39.0–52.0)
Hemoglobin: 11.5 g/dL — ABNORMAL LOW (ref 13.0–17.0)
Immature Granulocytes: 0 %
Lymphocytes Relative: 27 %
Lymphs Abs: 1.6 10*3/uL (ref 0.7–4.0)
MCH: 27.5 pg (ref 26.0–34.0)
MCHC: 32.2 g/dL (ref 30.0–36.0)
MCV: 85.4 fL (ref 80.0–100.0)
Monocytes Absolute: 0.6 10*3/uL (ref 0.1–1.0)
Monocytes Relative: 11 %
Neutro Abs: 3.1 10*3/uL (ref 1.7–7.7)
Neutrophils Relative %: 55 %
Platelets: 217 10*3/uL (ref 150–400)
RBC: 4.18 MIL/uL — ABNORMAL LOW (ref 4.22–5.81)
RDW: 14 % (ref 11.5–15.5)
WBC: 5.8 10*3/uL (ref 4.0–10.5)
nRBC: 0 % (ref 0.0–0.2)

## 2020-04-24 LAB — URINALYSIS, ROUTINE W REFLEX MICROSCOPIC
Bacteria, UA: NONE SEEN
Bilirubin Urine: NEGATIVE
Glucose, UA: >=500 mg/dL — AB
Ketones, ur: NEGATIVE mg/dL
Leukocytes,Ua: NEGATIVE
Nitrite: NEGATIVE
Protein, ur: NEGATIVE mg/dL
Specific Gravity, Urine: 1.009 (ref 1.005–1.030)
pH: 6 (ref 5.0–8.0)

## 2020-04-24 LAB — GLUCOSE, CAPILLARY
Glucose-Capillary: 212 mg/dL — ABNORMAL HIGH (ref 70–99)
Glucose-Capillary: 279 mg/dL — ABNORMAL HIGH (ref 70–99)
Glucose-Capillary: 254 mg/dL — ABNORMAL HIGH (ref 70–99)
Glucose-Capillary: 212 mg/dL — ABNORMAL HIGH (ref 70–99)

## 2020-04-24 MED ORDER — BISACODYL 10 MG RE SUPP
10.0000 mg | Freq: Once | RECTAL | Status: AC
  Administered 2020-04-24: 10 mg via RECTAL
  Filled 2020-04-24: qty 1

## 2020-04-24 MED ORDER — POLYETHYLENE GLYCOL 3350 17 G PO PACK
17.0000 g | PACK | Freq: Two times a day (BID) | ORAL | Status: DC
  Administered 2020-04-24 – 2020-04-26 (×4): 17 g via ORAL
  Filled 2020-04-24 (×5): qty 1

## 2020-04-24 MED ORDER — INSULIN GLARGINE 100 UNIT/ML ~~LOC~~ SOLN
45.0000 [IU] | Freq: Two times a day (BID) | SUBCUTANEOUS | Status: DC
  Administered 2020-04-24 – 2020-04-26 (×5): 45 [IU] via SUBCUTANEOUS
  Filled 2020-04-24 (×5): qty 0.45

## 2020-04-24 MED ORDER — SORBITOL 70 % SOLN
30.0000 mL | Freq: Once | Status: AC
  Administered 2020-04-24: 30 mL via ORAL
  Filled 2020-04-24: qty 30

## 2020-04-24 NOTE — Evaluation (Addendum)
Physical Therapy Evaluation Patient Details Name: Kenneth Osborn MRN: [id number] DOB: 08-Jun-1955 Today's Date: 04/24/2020   History of Present Illness  "Patient 65 year old gentleman history of coronary artery disease, chronic pain syndrome on chronic opioids, diabetes, hypertension, hyperlipidemia who underwent laparoscopic cholecystectomy 04/20/2020 and while this was being performed underwent left nephrectomy for 6 cm renal mass which was performed by general surgery, Dr. Johney Maine (general surgery) and urology, Dr. Alinda Money.  Patient was discharged 04/22/2020 however when he went home attempted to walk to the bathroom to urinate, got incontinent with significant weakness with inability to ambulate and fell to the floor due to weakness.  Patient with some complaints of abdominal pain.  Patient noted by wife to have a fever with a temp of 101.4 and noted to be hypoxic with sats in the 70s, placed on oxygen and brought to the ED." Pt admitted for acute respiratory failure with hypoxia.  Clinical Impression  Pt admitted with above diagnosis.  Pt currently with functional limitations due to the deficits listed below (see PT Problem List). Pt will benefit from skilled PT to increase their independence and safety with mobility to allow discharge to the venue listed below.  Pt assisted with ambulating in hallway and requiring supplemental oxygen at this time (see mobility section below for details).  Pt pleasant and eager to mobilize.  Pt encouraged to sit up in recliner when finished with using bathroom.  Spouse present for session.  Update 04/25/20: Pt performing poorly with OT today.  24/7 assist for safety is strongly recommended and spouse feels unable to provide current assist level at this time.  Update d/c recommendations for SNF.    Follow Up Recommendations Supervision/Assistance - 24 hour; SNF    Equipment Recommendations  None recommended by PT    Recommendations for Other Services       Precautions  / Restrictions Precautions Precautions: Fall Precaution Comments: monitor sats      Mobility  Bed Mobility Overal bed mobility: Needs Assistance       Supine to sit: Supervision;HOB elevated     General bed mobility comments: increased time and effort, pt utilized hand rail    Transfers Overall transfer level: Needs assistance Equipment used: Rolling walker (2 wheeled) Transfers: Sit to/from Stand Sit to Stand: Min guard         General transfer comment: min guard for safety, cues for hand placement  Ambulation/Gait Ambulation/Gait assistance: Min assist;Min guard Gait Distance (Feet): 400 Feet Assistive device: Rolling walker (2 wheeled) Gait Pattern/deviations: Step-through pattern;Trunk flexed;Drifts right/left Gait velocity: increased pace without assistive device, slower pace when using RW   General Gait Details: initially ambulated 120 feet with no RW and min assist for stability, SpO2 dropped to 87% and then 85% upon returning to room.  2L O2 [place] applied and pt ambulated again 400 feet with RW and SpO2 93-99% on 2L O2 [place]  Stairs            Wheelchair Mobility    Modified Rankin (Stroke Patients Only)       Balance Overall balance assessment: Needs assistance         Standing balance support: No upper extremity supported Standing balance-Leahy Scale: Fair Standing balance comment: able to stand without UE support, does not tolerate a challenge                             Pertinent Vitals/Pain Pain Assessment: Faces Faces Pain Scale:  Hurts little more Pain Location: mid lower abdomen and right flank Pain Descriptors / Indicators: Sore Pain Intervention(s): Repositioned;Monitored during session;Premedicated before session    Home Living Family/patient expects to be discharged to:: Private residence Living Arrangements: Spouse/significant other Available Help at Discharge: Family;Available 24 hours/day Type of Home: House Home  Access: Level entry     Home Layout: Two level Home Equipment: Cane - single point;Walker - 2 wheels;Bedside commode;Shower seat;Grab bars - tub/shower Additional Comments: reports BSC too large    Prior Function Level of Independence: Needs assistance   Gait / Transfers Assistance Needed: Occasionally walks without AD; uses cane and walker or furniture surfs 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); can ambulate in community  ADL's / Homemaking Assistance Needed: Independent with ADLs or if bad day wife assist; typically stands for showers but has chair if needed  Comments: fall prior to admission     Hand Dominance   Dominant Hand: Right    Extremity/Trunk Assessment        Lower Extremity Assessment Lower Extremity Assessment: Overall WFL for tasks assessed    Cervical / Trunk Assessment Cervical / Trunk Assessment: Kyphotic  Communication   Communication: No difficulties  Cognition Arousal/Alertness: Awake/alert Behavior During Therapy: Impulsive Overall Cognitive Status: Impaired/Different from baseline Area of Impairment: Awareness;Problem solving;Safety/judgement;Following commands                       Following Commands: Follows one step commands consistently Safety/Judgement: Decreased awareness of safety;Decreased awareness of deficits Awareness: Emergent Problem Solving: Requires verbal cues;Requires tactile cues General Comments: a little impulsive and decreased safety awareness. Spouse reports some mild cognition deficits at baseline per his recent admission      General Comments      Exercises     Assessment/Plan    PT Assessment Patient needs continued PT services  PT Problem List Decreased strength;Decreased mobility;Decreased activity tolerance;Decreased knowledge of use of DME;Decreased balance;Cardiopulmonary status limiting activity       PT Treatment Interventions DME instruction;Therapeutic activities;Gait  training;Therapeutic exercise;Patient/family education;Balance training;Functional mobility training;Neuromuscular re-education    PT Goals (Current goals can be found in the Care Plan section)  Acute Rehab PT Goals PT Goal Formulation: With patient/family Time For Goal Achievement: 05/08/20 Potential to Achieve Goals: Good    Frequency Min 3X/week   Barriers to discharge        Co-evaluation               AM-PAC PT "6 Clicks" Mobility  Outcome Measure Help needed turning from your back to your side while in a flat bed without using bedrails?: A Little Help needed moving from lying on your back to sitting on the side of a flat bed without using bedrails?: A Little Help needed moving to and from a bed to a chair (including a wheelchair)?: A Little Help needed standing up from a chair using your arms (e.g., wheelchair or bedside chair)?: A Little Help needed to walk in hospital room?: A Little Help needed climbing 3-5 steps with a railing? : A Lot 6 Click Score: 17    End of Session Equipment Utilized During Treatment: Gait belt;Oxygen Activity Tolerance: Patient tolerated treatment well Patient left: with family/visitor present (requested to be left on toilet, aware to use pull cord, spouse providing supervision until pt calls out for assist) Nurse Communication: Mobility status (RN aware pt is on toilet, spouse supervising) PT Visit Diagnosis: Difficulty in walking, not elsewhere classified (R26.2)  Time: 7227-7375 PT Time Calculation (min) (ACUTE ONLY): 28 min   Charges:   PT Evaluation $PT Eval Moderate Complexity: 1 Mod PT Treatments $Gait Training: 8-22 mins      Jannette Spanner PT, DPT Acute Rehabilitation Services Pager: [phone] Office: [phone]  York Ram E 04/24/2020, 1:19 PM

## 2020-04-24 NOTE — Progress Notes (Addendum)
PROGRESS NOTE    Kenneth Osborn  CMK:[id number] DOB: 1955/11/15 DOA: 04/23/2020 PCP: [name], MD    Chief Complaint  Patient presents with  . Post-op Problem    Brief Narrative:  Patient 65 year old gentleman history of coronary artery disease, chronic pain syndrome on chronic opioids, diabetes, hypertension, hyperlipidemia who underwent laparoscopic cholecystectomy 04/20/2020 and while this was being performed underwent left nephrectomy for 6 cm renal mass which was performed by general surgery, Dr. Johney Maine (general surgery) and urology, Dr. Alinda Money.  Patient was discharged 04/22/2020 however when he went home attempted to walk to the bathroom to urinate, got incontinent with significant weakness with inability to ambulate and fell to the floor due to weakness.  Patient with some complaints of abdominal pain.  Patient noted by wife to have a fever with a temp of 101.4 and noted to be hypoxic with sats in the 70s, placed on oxygen and brought to the ED. Patient seen in the ED CT abdomen and pelvis with 6 cm loculated fluid collection in the renal fossa, and the abdomen within expected limits, small pleural effusions with atelectasis.  Patient noted to have a creatinine of 2.0 from 0.8 preop.  Chest x-ray done negative.  COVID-19 PCR negative.  Patient admitted for further evaluation and management.  Urology notified of admission and following.   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  Patient noted to have sats of 80% on room air at rest, while awake and talking on presentation with slightly increased respiratory rate.  Chest x-ray done negative. -It is noted per urology that during recent hospitalization she noted to be mildly hypoxic after ambulating and when sleeping but was 94 to 95% on room air when awake. -Patient does endorse a prior history of OSA and was on CPAP which was discontinued approximately 5 years ago. -VQ scan negative for PE. -DC lower extremity Dopplers.   -Patient placed on CPAP and per patient and wife slept well last night on the CPAP.   -Check ambulatory sats.   -Continue incentive spirometry, flutter valve.   -Continue CPAP nightly and when sleeping or taking a nap.   -Supportive care.   -Follow.   2.  Acute kidney injury Creatinine elevated to 2.0 on admission, from 0.8 preoperatively.  Patient status post nephrectomy. -Patient seen by urology in consultation who feel elevated creatinine not unexpected and relatively stable since discharge.  Urology expecting renal function to stabilize and improve over the next couple of months.  Continue to hold Vasotec.  Renal function improved with hydration.  Saline lock IV fluids.  Follow.    3.  Status post left radical nephrectomy (04/20/2020) Patient being followed by urology seen by urology.    4.  Chronic pain syndrome and opiate dependence -Continue home regimen oxycodone. -Continue IV as needed morphine. -Continue as needed Flexeril and scheduled Neurontin. -Change MiraLAX to twice daily.  Continue Senokot-S twice daily.  DC as needed Colace.   5.  Diabetes mellitus type 2 -Creatinine noted at 2.0. -Metformin discontinued will likely not resume on discharge. -Was on U500 prior to admission. -CBG 212.  -Increase Lantus to 45 units twice daily.  Continue meal coverage NovoLog 6 units 3 times daily.   Sliding scale insulin.  Diabetes coordinator following.  6.  Hypertension Blood pressure noted to be borderline on presentation.  Blood pressure improved.  Continue Toprol-XL, Cardizem.  Continue to hold Vasotec.  Follow.    7.  CAD Stable.  Continue aspirin, Effient, Toprol-XL, Cardizem.  Continue to hold Vasotec.  Continue statin.  Follow.   8.  Generalized weakness PT/OT pending.  9.  History of OSA Patient states slept well on CPAP overnight.  Patient states was on CPAP 5 years ago which was discontinued.  Continue CPAP nightly.  Outpatient follow-up to evaluate need for CPAP.  10.  Constipation Patient states has not had a bowel movement in 5 days however per RN patient had a bowel movement on 04/21/2020.  Patient on chronic opioid pain medication.  Placed on a bowel regimen of MiraLAX twice daily.  Senokot-S twice daily.  We will give a dose of sorbitol 30 cc p.o. x1.  Follow.  11.  Low-grade temperature Patient noted to have a low-grade fever overnight with a temp of 100.6.  May be secondary to atelectasis.  Currently afebrile.  Normal white count.  Chest x-ray done on admission negative.  Repeat UA with cultures and sensitivities.  Check blood cultures x2 as patient with recent instrumentation.  Pulmonary toilet.  Follow.    DVT prophylaxis: Lovenox Code Status: Full Family Communication: Updated patient and wife at bedside. Disposition:   Status is: Inpatient    Dispo: The patient is from: Home              Anticipated d/c is to: Home with home health versus SNF              Patient currently being evaluated for hypoxia, generalized weakness.  Not stable for discharge.   Difficult to place patient no       Consultants:   Urology: Dr. Alinda Money 04/23/2020  Procedures:   VQ scan 04/23/2020  CT abdomen and pelvis 04/23/2020  Chest x-ray 04/23/2020    Antimicrobials:   None   Subjective: Patient sleeping but arousable.  Wife at bedside.  Denies any chest pain.  No  shortness of breath.  No significant worsening abdominal discomfort.  States has not had a bowel movement in about a week.  Per wife slept well on CPAP overnight.  T-max 100.6 last night.   Objective: Vitals:   04/23/20 2212 04/23/20 2334 04/24/20 0408 04/24/20 0724  BP:  (!) 155/76 (!) 147/80 (!) 143/81  Pulse: (!) 102 (!) 102 98 86  Resp:  (!) 21 (!) 25 (!) 21  Temp:  98.8 F (37.1 C) 100.1 F (37.8 C) 99.4 F (37.4 C)  TempSrc:  Oral Axillary Oral  SpO2: 94% 97% 95% 97%  Weight:   98.6 kg   Height:        Intake/Output Summary (Last 24 hours) at 04/24/2020 1043 Last data filed at 04/24/2020 0850 Gross per 24 hour  Intake 2332.99 ml  Output 1150 ml  Net 1182.99 ml   Filed Weights   04/23/20 0058 04/24/20 0408  Weight: 97.5 kg 98.6 kg    Examination:  General exam: Appears calm and comfortable  Respiratory system: CTA B anterior lung fields.  No wheezes, no crackles, no rhonchi.  Normal respiratory effort.   Cardiovascular system: S1 & S2 heard, RRR. No JVD, murmurs, rubs, gallops or clicks. No pedal edema. Gastrointestinal system: Abdomen mildly distended, soft, some diffuse tenderness to palpation, positive bowel sounds.  Mid incision sites C/C/I.  Port site C/D/I.  Central nervous system: Alert and oriented.  No focal neurological deficits.   Extremities: Symmetric 5 x 5 power. Skin: No rashes, lesions or ulcers Psychiatry: Judgement and insight appear normal. Mood & affect appropriate.     Data Reviewed: I have personally reviewed following labs and imaging studies  CBC: Recent Labs  Lab 04/20/20 1812 04/21/20 0403 04/22/20 0409 04/23/20 0145 04/24/20 0440  WBC  --   --   --  9.8 5.8  NEUTROABS  --   --   --  6.7 3.1  HGB 13.3 13.0 11.6* 11.5* 11.5*  HCT 40.6 38.8* 36.6* 36.5* 35.7*  MCV  --   --   --  87.3 85.4  PLT  --   --   --  206 975    Basic Metabolic Panel: Recent Labs  Lab 04/20/20 1812 04/21/20 0403 04/22/20 0409 04/23/20 0145  04/24/20 0440  NA 138 134* 134* 137 135  K 4.1 4.8 4.6 4.5 4.3  CL 104 102 100 103 102  CO2 21* 24 30 26 26   GLUCOSE 309* 399* 315* 371* 219*  BUN 12 17 20  24* 13  CREATININE 1.16 1.60* 1.94* 2.07* 1.52*  CALCIUM 9.0 8.9 9.1 9.5 9.6    GFR: Estimated Creatinine Clearance: 56.1 mL/min (A) (by C-G formula based on SCr of 1.52 mg/dL (H)).  Liver Function Tests: Recent Labs  Lab 04/23/20 0145  AST 25  ALT 24  ALKPHOS 67  BILITOT 0.5  PROT 6.6  ALBUMIN 3.3*    CBG: Recent Labs  Lab 04/23/20 0803 04/23/20 1143 04/23/20 1617 04/23/20 2145 04/24/20 0721  GLUCAP 248* 187* 200* 206* 212*     Recent Results (from the past 240 hour(s))  SARS CORONAVIRUS 2 (TAT 6-24 HRS) Nasopharyngeal Nasopharyngeal Swab     Status: None   Collection Time: 04/17/20 10:52 AM   Specimen: Nasopharyngeal Swab  Result Value Ref Range Status   SARS Coronavirus 2 NEGATIVE NEGATIVE Final    Comment: (NOTE) SARS-CoV-2 target nucleic acids are NOT DETECTED.  The SARS-CoV-2 RNA is generally detectable in upper and lower respiratory specimens during the acute phase of infection. Negative results do not preclude SARS-CoV-2 infection, do not rule out co-infections with other pathogens, and should not be used as the sole basis for treatment or other patient management decisions. Negative results must be combined with clinical observations, patient history, and epidemiological information. The expected result is Negative.  Fact Sheet for Patients: SugarRoll.be  Fact Sheet for Healthcare Providers: https://www.woods-mathews.com/  This test is not yet approved or cleared by the [place] FDA and  has been authorized for detection and/or diagnosis of SARS-CoV-2 by FDA under an Emergency Use Authorization (EUA). This EUA will remain  in effect (meaning this test can be used) for the duration of the COVID-19 declaration under Se ction 564(b)(1) of the Act,  21 U.S.C. section 360bbb-3(b)(1), unless the authorization is terminated or revoked sooner.  Performed at [facility], [place] [address], [place], [place] [address]   Resp Panel by RT-PCR (Flu A&B, Covid) Nasopharyngeal Swab     Status: None   Collection Time: 04/23/20  5:17 AM   Specimen: Nasopharyngeal Swab; Nasopharyngeal(NP) swabs  in vial transport medium  Result Value Ref Range Status   SARS Coronavirus 2 by RT PCR NEGATIVE NEGATIVE Final    Comment: (NOTE) SARS-CoV-2 target nucleic acids are NOT DETECTED.  The SARS-CoV-2 RNA is generally detectable in upper respiratory specimens during the acute phase of infection. The lowest concentration of SARS-CoV-2 viral copies this assay can detect is 138 copies/mL. A negative result does not preclude SARS-Cov-2 infection and should not be used as the sole basis for treatment or other patient management decisions. A negative result may occur with  improper specimen collection/handling, submission of specimen other than nasopharyngeal swab, presence of viral mutation(s) within the areas targeted by this assay, and inadequate number of viral copies(<138 copies/mL). A negative result must be combined with clinical observations, patient history, and epidemiological information. The expected result is Negative.  Fact Sheet for Patients:  EntrepreneurPulse.com.au  Fact Sheet for Healthcare Providers:  IncredibleEmployment.be  This test is no t yet approved or cleared by the [place] FDA and  has been authorized for detection and/or diagnosis of SARS-CoV-2 by FDA under an Emergency Use Authorization (EUA). This EUA will remain  in effect (meaning this test can be used) for the duration of the COVID-19 declaration under Section 564(b)(1) of the Act, 21 U.S.C.section 360bbb-3(b)(1), unless the authorization is terminated  or revoked sooner.       Influenza A by PCR NEGATIVE NEGATIVE Final    Influenza B by PCR NEGATIVE NEGATIVE Final    Comment: (NOTE) The Xpert Xpress SARS-CoV-2/FLU/RSV plus assay is intended as an aid in the diagnosis of influenza from Nasopharyngeal swab specimens and should not be used as a sole basis for treatment. Nasal washings and aspirates are unacceptable for Xpert Xpress SARS-CoV-2/FLU/RSV testing.  Fact Sheet for Patients: EntrepreneurPulse.com.au  Fact Sheet for Healthcare Providers: IncredibleEmployment.be  This test is not yet approved or cleared by the [place] FDA and has been authorized for detection and/or diagnosis of SARS-CoV-2 by FDA under an Emergency Use Authorization (EUA). This EUA will remain in effect (meaning this test can be used) for the duration of the COVID-19 declaration under Section 564(b)(1) of the Act, 21 U.S.C. section 360bbb-3(b)(1), unless the authorization is terminated or revoked.  Performed at [facility], [place] [address], [place], [place] [address]          Radiology Studies: CT ABDOMEN PELVIS WO CONTRAST  Result Date: 04/23/2020 CLINICAL DATA:  Status post nephrectomy and cholecystectomy. Abdominal distension, rebound tenderness EXAM: CT ABDOMEN AND PELVIS WITHOUT CONTRAST TECHNIQUE: Multidetector CT imaging of the abdomen and pelvis was performed following the standard protocol without IV contrast. COMPARISON:  03/07/2020 FINDINGS: Lower chest: Small right pleural effusion is present with compressive atelectasis of the right lower lobe. Mild left basilar atelectasis. Coronary artery stenting has been performed. Cardiac size within normal limits. Trace pericardial effusion is likely physiologic. Hepatobiliary: Status post cholecystectomy and removal of the cholecystostomy catheter. Poorly circumscribed fluid within the gallbladder fossa. No intra or extrahepatic biliary ductal dilation. Liver unremarkable. Pancreas: Unremarkable Spleen: Unremarkable  Adrenals/Urinary Tract: Interval left nephrectomy with small loculated fluid collection within the nephrectomy bed measuring 3.6 x 6.3 cm. This abuts and obscures the left adrenal gland. Right adrenal gland and kidney are unremarkable. Bladder is unremarkable. Stomach/Bowel: Stomach, small bowel, and large bowel are unremarkable. Appendix absent. Trace perihepatic ascites. Punctate foci of free intraperitoneal gas within the abdomen noted anteriorly, likely postsurgical in nature. Subcutaneous gas within the anterior abdominal wall within the suprapubic region  is also likely postsurgical in nature. Vascular/Lymphatic: Mild aortoiliac atherosclerotic calcification. No aortic aneurysm. No pathologic adenopathy within the abdomen and pelvis. Reproductive: Prostate is unremarkable. Other: Tiny fat containing umbilical hernia. Infiltration within the anterior abdominal wall may be postsurgical in nature and appears unchanged from prior examination. Rectum unremarkable. Musculoskeletal: No acute bone abnormality. IMPRESSION: Interval left nephrectomy with 6.3 cm loculated fluid collection within the nephrectomy bed. Interval cholecystectomy with poorly circumscribed fluid within the gallbladder fossa and trace perihepatic ascites. Punctate foci of gas within the peritoneum and subcutaneous gas within the suprapubic region within expectation given recent laparoscopic surgery. Small right pleural effusion with bibasilar atelectasis. Aortic Atherosclerosis (ICD10-I70.0). Electronically Signed   By: Fidela Salisbury MD   On: 04/23/2020 02:30   NM Pulmonary Perfusion  Result Date: 04/23/2020 CLINICAL DATA:  65 year old male with shortness of breath. EXAM: NUCLEAR MEDICINE PERFUSION LUNG SCAN TECHNIQUE: Perfusion images were obtained in multiple projections after intravenous injection of radiopharmaceutical. Ventilation scans intentionally deferred if perfusion scan and chest x-ray adequate for interpretation during COVID 19  epidemic. RADIOPHARMACEUTICALS:  4.4 mCi Tc-9mMAA IV COMPARISON:  04/23/2020 chest radiograph FINDINGS: No which shaped perfusion defects are noted to suggest pulmonary emboli. IMPRESSION: Pulmonary embolism absent Electronically Signed   By: JMargarette CanadaM.D.   On: 04/23/2020 14:48   DG Chest Port 1 View  Result Date: 04/23/2020 CLINICAL DATA:  Weakness, hypoxia EXAM: PORTABLE CHEST 1 VIEW COMPARISON:  02/13/2020 FINDINGS: Lung volumes are small and there is mild elevation of the right hemidiaphragm with asymmetric right basilar atelectasis. No superimposed focal pulmonary infiltrate. No pneumothorax or pleural effusion. Cardiac size within normal limits. Pulmonary vascularity normal. No acute bone abnormality. IMPRESSION: Pulmonary hypoinflation Electronically Signed   By: AFidela SalisburyMD   On: 04/23/2020 02:19        Scheduled Meds: . ALPRAZolam  0.5 mg Oral QHS  . aspirin EC  81 mg Oral QHS  . diltiazem  240 mg Oral Daily  . enoxaparin (LOVENOX) injection  40 mg Subcutaneous Q24H  . ezetimibe  10 mg Oral Daily  . gabapentin  1,600 mg Oral BID  . insulin aspart  0-20 Units Subcutaneous TID WC  . insulin aspart  6 Units Subcutaneous TID WC  . insulin glargine  45 Units Subcutaneous BID  . loratadine  10 mg Oral Daily  . magnesium oxide  400 mg Oral Daily  . metoprolol succinate  100 mg Oral BID  . montelukast  10 mg Oral QHS  . niacin  500 mg Oral QHS  . pantoprazole  80 mg Oral BID  . polyethylene glycol  17 g Oral BID  . prasugrel  10 mg Oral Daily  . rosuvastatin  40 mg Oral QHS  . senna-docusate  1 tablet Oral BID  . sorbitol  30 mL Oral Once   Continuous Infusions:    LOS: 1 day    Time spent: 40 minutes     DIrine Seal MD [facility]   To contact the attending provider between 7A-7P or the covering provider during after hours 7P-7A, please log into the web site [email or web address] and access using universal [place] password for that web site. If you  do not have the password, please call the hospital operator.  04/24/2020, 10:43 AM

## 2020-04-24 NOTE — Progress Notes (Signed)
Patient ID: Kenneth Osborn, male   DOB: 11-30-1955, 65 y.o.   MRN: [id number]    Subjective: Pt feeling better.  Has not ambulated since yesterday.  Tolerating diet but does not like the food here.  Complains of persistent urinary urgency.  Objective: Vital signs in last 24 hours: Temp:  [98.2 F (36.8 C)-100.6 F (38.1 C)] 99.4 F (37.4 C) (03/07 0724) Pulse Rate:  [86-114] 86 (03/07 0724) Resp:  [18-29] 21 (03/07 0724) BP: (143-170)/(73-81) 143/81 (03/07 0724) SpO2:  [90 %-97 %] 97 % (03/07 0724) Weight:  [98.6 kg] 98.6 kg (03/07 0408)  Intake/Output from previous day: 03/06 0701 - 03/07 0700 In: 2333 [P.O.:340; I.V.:1993] Out: 900 [Urine:900] Intake/Output this shift: No intake/output data recorded.  Physical Exam:  General: Alert and oriented Abd: Mild distention Incisions: C/D/I  Lab Results: Recent Labs    04/22/20 0409 04/23/20 0145 04/24/20 0440  HGB 11.6* 11.5* 11.5*  HCT 36.6* 36.5* 35.7*   BMET Recent Labs    04/23/20 0145 04/24/20 0440  NA 137 135  K 4.5 4.3  CL 103 102  CO2 26 26  GLUCOSE 371* 219*  BUN 24* 13  CREATININE 2.07* 1.52*  CALCIUM 9.5 9.6     Studies/Results: CT ABDOMEN PELVIS WO CONTRAST  Result Date: 04/23/2020 CLINICAL DATA:  Status post nephrectomy and cholecystectomy. Abdominal distension, rebound tenderness EXAM: CT ABDOMEN AND PELVIS WITHOUT CONTRAST TECHNIQUE: Multidetector CT imaging of the abdomen and pelvis was performed following the standard protocol without IV contrast. COMPARISON:  03/07/2020 FINDINGS: Lower chest: Small right pleural effusion is present with compressive atelectasis of the right lower lobe. Mild left basilar atelectasis. Coronary artery stenting has been performed. Cardiac size within normal limits. Trace pericardial effusion is likely physiologic. Hepatobiliary: Status post cholecystectomy and removal of the cholecystostomy catheter. Poorly circumscribed fluid within the gallbladder fossa. No intra or  extrahepatic biliary ductal dilation. Liver unremarkable. Pancreas: Unremarkable Spleen: Unremarkable Adrenals/Urinary Tract: Interval left nephrectomy with small loculated fluid collection within the nephrectomy bed measuring 3.6 x 6.3 cm. This abuts and obscures the left adrenal gland. Right adrenal gland and kidney are unremarkable. Bladder is unremarkable. Stomach/Bowel: Stomach, small bowel, and large bowel are unremarkable. Appendix absent. Trace perihepatic ascites. Punctate foci of free intraperitoneal gas within the abdomen noted anteriorly, likely postsurgical in nature. Subcutaneous gas within the anterior abdominal wall within the suprapubic region is also likely postsurgical in nature. Vascular/Lymphatic: Mild aortoiliac atherosclerotic calcification. No aortic aneurysm. No pathologic adenopathy within the abdomen and pelvis. Reproductive: Prostate is unremarkable. Other: Tiny fat containing umbilical hernia. Infiltration within the anterior abdominal wall may be postsurgical in nature and appears unchanged from prior examination. Rectum unremarkable. Musculoskeletal: No acute bone abnormality. IMPRESSION: Interval left nephrectomy with 6.3 cm loculated fluid collection within the nephrectomy bed. Interval cholecystectomy with poorly circumscribed fluid within the gallbladder fossa and trace perihepatic ascites. Punctate foci of gas within the peritoneum and subcutaneous gas within the suprapubic region within expectation given recent laparoscopic surgery. Small right pleural effusion with bibasilar atelectasis. Aortic Atherosclerosis (ICD10-I70.0). Electronically Signed   By: Fidela Salisbury MD   On: 04/23/2020 02:30   NM Pulmonary Perfusion  Result Date: 04/23/2020 CLINICAL DATA:  64 year old male with shortness of breath. EXAM: NUCLEAR MEDICINE PERFUSION LUNG SCAN TECHNIQUE: Perfusion images were obtained in multiple projections after intravenous injection of radiopharmaceutical. Ventilation scans  intentionally deferred if perfusion scan and chest x-ray adequate for interpretation during COVID 19 epidemic. RADIOPHARMACEUTICALS:  4.4 mCi Tc-73mMAA IV COMPARISON:  04/23/2020 chest radiograph FINDINGS: No which shaped perfusion defects are noted to suggest pulmonary emboli. IMPRESSION: Pulmonary embolism absent Electronically Signed   By: Margarette Canada M.D.   On: 04/23/2020 14:48   DG Chest Port 1 View  Result Date: 04/23/2020 CLINICAL DATA:  Weakness, hypoxia EXAM: PORTABLE CHEST 1 VIEW COMPARISON:  02/13/2020 FINDINGS: Lung volumes are small and there is mild elevation of the right hemidiaphragm with asymmetric right basilar atelectasis. No superimposed focal pulmonary infiltrate. No pneumothorax or pleural effusion. Cardiac size within normal limits. Pulmonary vascularity normal. No acute bone abnormality. IMPRESSION: Pulmonary hypoinflation Electronically Signed   By: Fidela Salisbury MD   On: 04/23/2020 02:19    Assessment/Plan: POD # 4 s/p left robotic radical nephrectomy and cholecystectomy - Pathology pending - No evidence of PE per VQ scan - Renal function now starting to improve as expected s/p nephrectomy - Will check PVRs today to evaluate his urinary urgency and urge incontinence - Continue PT to improve ambulation   LOS: 1 day   [name] 04/24/2020, 7:26 AM

## 2020-04-25 DIAGNOSIS — R0902 Hypoxemia: Secondary | ICD-10-CM | POA: Diagnosis not present

## 2020-04-25 DIAGNOSIS — I10 Essential (primary) hypertension: Secondary | ICD-10-CM | POA: Diagnosis not present

## 2020-04-25 DIAGNOSIS — N179 Acute kidney failure, unspecified: Secondary | ICD-10-CM | POA: Diagnosis not present

## 2020-04-25 DIAGNOSIS — J9601 Acute respiratory failure with hypoxia: Secondary | ICD-10-CM | POA: Diagnosis not present

## 2020-04-25 LAB — CBC
HCT: 34.2 % — ABNORMAL LOW (ref 39.0–52.0)
Hemoglobin: 11 g/dL — ABNORMAL LOW (ref 13.0–17.0)
MCH: 27.4 pg (ref 26.0–34.0)
MCHC: 32.2 g/dL (ref 30.0–36.0)
MCV: 85.3 fL (ref 80.0–100.0)
Platelets: 225 10*3/uL (ref 150–400)
RBC: 4.01 MIL/uL — ABNORMAL LOW (ref 4.22–5.81)
RDW: 13.7 % (ref 11.5–15.5)
WBC: 8.8 10*3/uL (ref 4.0–10.5)
nRBC: 0 % (ref 0.0–0.2)

## 2020-04-25 LAB — GLUCOSE, CAPILLARY
Glucose-Capillary: 187 mg/dL — ABNORMAL HIGH (ref 70–99)
Glucose-Capillary: 262 mg/dL — ABNORMAL HIGH (ref 70–99)
Glucose-Capillary: 206 mg/dL — ABNORMAL HIGH (ref 70–99)
Glucose-Capillary: 162 mg/dL — ABNORMAL HIGH (ref 70–99)

## 2020-04-25 LAB — BASIC METABOLIC PANEL
Anion gap: 9 (ref 5–15)
BUN: 16 mg/dL (ref 8–23)
CO2: 26 mmol/L (ref 22–32)
Calcium: 9.4 mg/dL (ref 8.9–10.3)
Chloride: 100 mmol/L (ref 98–111)
Creatinine, Ser: 1.6 mg/dL — ABNORMAL HIGH (ref 0.61–1.24)
GFR, Estimated: 48 mL/min — ABNORMAL LOW (ref >60)
Glucose, Bld: 183 mg/dL — ABNORMAL HIGH (ref 70–99)
Potassium: 4 mmol/L (ref 3.5–5.1)
Sodium: 135 mmol/L (ref 135–145)

## 2020-04-25 LAB — SURGICAL PATHOLOGY

## 2020-04-25 MED ORDER — SORBITOL 70 % SOLN
960.0000 mL | TOPICAL_OIL | Freq: Once | ORAL | Status: AC
  Administered 2020-04-25: 960 mL via RECTAL
  Filled 2020-04-25: qty 473

## 2020-04-25 MED ORDER — ORAL CARE MOUTH RINSE
15.0000 mL | Freq: Two times a day (BID) | OROMUCOSAL | Status: DC
  Administered 2020-04-25 (×2): 15 mL via OROMUCOSAL

## 2020-04-25 MED ORDER — MINERAL OIL RE ENEM
1.0000 | ENEMA | Freq: Once | RECTAL | Status: AC
  Administered 2020-04-25: 1 via RECTAL
  Filled 2020-04-25: qty 1

## 2020-04-25 NOTE — Evaluation (Signed)
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 Johney Maine (general surgery) and urology, Dr. Alinda Money.  Patient was discharged 04/22/2020 however when he went home attempted to walk to the bathroom to urinate, got incontinent with significant weakness with inability to ambulate and fell to the floor due to weakness.  Patient with some complaints of abdominal pain.  Patient noted by wife to have a fever with a temp of 101.4 and noted to be hypoxic with sats in the 70s, placed on oxygen and brought to the ED." Pt admitted for acute respiratory failure with hypoxia.   Clinical Impression   Patient is currently requiring assistance with ADLs including minimal assist with toileting, LE dressing, and with bathing, and setup assist for grooming and UE dressing as pt with current poor tolerance to standing, all of which is below patient's typical baseline of being Independent.  During this evaluation, patient was limited by pain, lethargy, new onset diplopia and UE weakness, which has the potential to impact patient's safety and independence during functional mobility, as well as performance for ADLs. Bieber "6-clicks" Daily Activity Inpatient Short Form score of 19/24 indicates 42.82% ADL impairment this session. Patient lives with his spouse, who is able to provide 24/7 supervision and assistance, but expressed that she does not think she can care for him adequately at his current level of function.  Patient demonstrates good rehab potential, and should benefit from continued  skilled occupational therapy services while in acute care to maximize safety, independence and quality of life at home.  Continued occupational therapy services in a SNF setting prior to return home is recommended.  ?     Follow Up Recommendations  SNF (Pt's spouse does not think she can adequately care for him at his current level of function.)    Equipment Recommendations  None recommended by OT    Recommendations for Other Services       Precautions / Restrictions Precautions Precautions: Fall Precaution Comments: monitor sats Restrictions Weight Bearing Restrictions: No      Mobility Bed Mobility Overal bed mobility: Needs Assistance Bed Mobility: Supine to Sit     Supine to sit: Supervision;HOB elevated     General bed mobility comments: increased time and effort, pt utilized hand rail.  Pt has adjustable bed at home with rail. Patient Response: Flat affect  Transfers Overall transfer level: Needs assistance   Transfers: Sit to/from Stand Sit to Stand: Min guard         General transfer comment: min guard for safety, cues for hand placement, very slow moving and guarding for pain.  Unable to perform 30 second sit to stand test due to pt's pain.    Balance Overall balance assessment: Needs assistance;History of Falls Sitting-balance support: No upper extremity supported Sitting balance-Leahy Scale: Fair Sitting balance - Comments: Pt leans to LT and RT.   Standing balance support: No upper extremity supported Standing balance-Leahy Scale: Fair Standing balance comment: able to stand without UE support, does not tolerate a challenge  ADL either performed or assessed with clinical judgement   ADL Overall ADL's : Needs assistance/impaired Eating/Feeding: Set up;Sitting   Grooming: Set up;Sitting   Upper Body Bathing: Set up;Sitting;Supervision/ safety   Lower Body Bathing: Sit to/from stand;Minimal assistance    Upper Body Dressing : Set up;Supervision/safety;Sitting   Lower Body Dressing: Min guard;Sit to/from stand Lower Body Dressing Details (indicate cue type and reason): Pt able to demonstrate figure 4 position at [employer] Transfer: Min guard;Ambulation;Comfort height toilet;Grab bars;RW Armed forces technical officer Details (indicate cue type and reason): Pt did not go to [facility], but stood from EOB and took lateral steps with Min guard.  Please see Mobility section. Toileting- Clothing Manipulation and Hygiene: Minimal assistance Toileting - Clothing Manipulation Details (indicate cue type and reason): Pt has been constipated and on condom catheter. Min As based on general assessment. Tub/ Shower Transfer: Minimal assistance;Walk-in shower Tub/Shower Transfer Details (indicate cue type and reason): Based on general assessment. Functional mobility during ADLs: Min guard       Vision Baseline Vision/History: Wears glasses Wears Glasses: At all times Patient Visual Report: Diplopia Additional Comments: Pt reports new onset diplopia since procedure last Thursday.  RN notified.     Perception     Praxis      Pertinent Vitals/Pain Pain Assessment: 0-10 Pain Score: 5  Pain Location: mid lower abdomen and right flank Pain Descriptors / Indicators: Sore Pain Intervention(s): Repositioned;RN gave pain meds during session;Limited activity within patient's tolerance;Monitored during session     Hand Dominance Right   Extremity/Trunk Assessment Upper Extremity Assessment Upper Extremity Assessment: Generalized weakness (MMT NT proximally due to abdominal pain.)   Lower Extremity Assessment Lower Extremity Assessment: Defer to PT evaluation   Cervical / Trunk Assessment Cervical / Trunk Assessment: Kyphotic   Communication Communication Communication: No difficulties   Cognition Arousal/Alertness: Lethargic Behavior During Therapy: Agitated Overall Cognitive Status: Impaired/Different from  baseline Area of Impairment: Awareness;Problem solving;Safety/judgement;Following commands                 Orientation Level: Disoriented to;Time     Following Commands: Follows one step commands consistently Safety/Judgement: Decreased awareness of safety;Decreased awareness of deficits     General Comments: Spouse reports some mild cognition deficits at baseline per his recent admission.   General Comments       Exercises     Shoulder Instructions      Home Living Family/patient expects to be discharged to:: Private residence Living Arrangements: Spouse/significant other Available Help at Discharge: Family;Available 24 hours/day Type of Home: House Home Access: Level entry     Home Layout: Two level Alternate Level Stairs-Number of Steps: pt reports having stair lift at home and bedrooms are upstairs, however pt plans to recover at his 1 story home in [place], [place]. Ramp to enter. Walk in shower, no safety bars. Raised toilet seat   Bathroom Shower/Tub: Walk-in shower         Home Equipment: Cane - single point;Walker - 2 wheels;Bedside commode;Shower seat;Grab bars - tub/shower   Additional Comments: reports BSC too large, however spouse states a smaller one is in storage. Pt was using BSC by the bed.      Prior Functioning/Environment Level of Independence: Needs assistance  Gait / Transfers Assistance Needed: Occasionally walks without AD; uses cane and walker or furniture surfs 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); can ambulate in community, but endorses at least 15 falls in last 12 months and additional 15 episodes of near falls. ADL's / Homemaking  Assistance Needed: Independent with ADLs or if bad day wife assist; typically stands for showers but has chair if needed   Comments: several falls prior to admission. Pt rpeorts fall just pta was a slide to bathroom floor.        OT Problem List: Decreased strength;Decreased activity tolerance;Impaired  balance (sitting and/or standing);Decreased cognition;Decreased safety awareness;Decreased knowledge of use of DME or AE;Decreased knowledge of precautions;Pain      OT Treatment/Interventions: Self-care/ADL training;Therapeutic exercise;Energy conservation;DME and/or AE instruction;Therapeutic activities;Patient/family education;Balance training    OT Goals(Current goals can be found in the care plan section) Acute Rehab OT Goals Patient Stated Goal: Pt would like to go home, but is open to rehab "depending on where it is." OT Goal Formulation: With patient/family Time For Goal Achievement: 05/09/20 Potential to Achieve Goals: Good ADL Goals Pt Will Perform Lower Body Dressing: with adaptive equipment;with modified independence;sit to/from stand Pt Will Transfer to Toilet: with modified independence;ambulating Pt Will Perform Toileting - Clothing Manipulation and hygiene: with modified independence;with adaptive equipment Additional ADL Goal #1: Patient will tolerate BUE home exercise program 10-15 reps within pain-free ranges, in an unsupported seated position, in order to improve upper body strength, endurance and core stability needed to complete home ADLs. Additional ADL Goal #2: Pt will engage in 8 min standing functional activities without loss of balance, in order to demonstrate improved activity tolerance and balance needed to perform ADLs safely at home.  OT Frequency: Min 2X/week   Barriers to D/C:    Spouse has no outside help. Daughters live an hour away and work.       Co-evaluation              AM-PAC OT "6 Clicks" Daily Activity     Outcome Measure Help from another person eating meals?: None Help from another person taking care of personal grooming?: A Little Help from another person toileting, which includes using toliet, bedpan, or urinal?: A Little Help from another person bathing (including washing, rinsing, drying)?: A Little Help from another person to put  on and taking off regular upper body clothing?: A Little Help from another person to put on and taking off regular lower body clothing?: A Little 6 Click Score: 19   End of Session Equipment Utilized During Treatment: Oxygen Nurse Communication:  (RN in room for mobility)  Activity Tolerance: Patient limited by lethargy;Patient limited by pain;Patient limited by fatigue;Treatment limited secondary to agitation Patient left: in bed;with family/visitor present;with call bell/phone within reach  OT Visit Diagnosis: Unsteadiness on feet (R26.81);Muscle weakness (generalized) (M62.81);Pain;Other symptoms and signs involving cognitive function Pain - part of body:  (abdomen)                Time: 0626-9485 OT Time Calculation (min): 27 min Charges:  OT General Charges $OT Visit: 1 Visit OT Evaluation $OT Eval Low Complexity: 1 Low OT Treatments $Therapeutic Activity: 8-22 mins  Anderson Malta, OT Acute Rehab Services Office: [phone] 04/25/2020  Julien Girt 04/25/2020, 2:21 PM

## 2020-04-25 NOTE — Progress Notes (Signed)
Patient ID: Kenneth Osborn, male   DOB: Oct 10, 1955, 65 y.o.   MRN: [id number]    Subjective: Pt continuing to feel better overall.  Still with complaints of urinary urgency and urge incontinence especially at night.  He states he did have this prior to surgery last week but is more significant now.  Denies abdominal pain.  Evaluated by PT and still recommend HHPT which should have been set up prior to last discharge although he may not have been home long enough for this to actually have been arranged.  Objective: Vital signs in last 24 hours: Temp:  [97.5 F (36.4 C)-98.7 F (37.1 C)] 98.7 F (37.1 C) (03/08 0549) Pulse Rate:  [88-107] 88 (03/08 0549) Resp:  [20] 20 (03/08 0549) BP: (134-168)/(80-85) 134/85 (03/08 0549) SpO2:  [85 %-100 %] 100 % (03/08 0549) Weight:  [96.9 kg] 96.9 kg (03/08 0500)  Intake/Output from previous day: 03/07 0701 - 03/08 0700 In: 530 [P.O.:530] Out: 975 [Urine:975] Intake/Output this shift: No intake/output data recorded.  Physical Exam:  General: Alert and oriented Abdomen: Soft, ND Incisions: C/D/I Ext: NT, No erythema  Lab Results: Recent Labs    04/23/20 0145 04/24/20 0440 04/25/20 0440  HGB 11.5* 11.5* 11.0*  HCT 36.5* 35.7* 34.2*   CBC Latest Ref Rng & Units 04/25/2020 04/24/2020 04/23/2020  WBC 4.0 - 10.5 K/uL 8.8 5.8 9.8  Hemoglobin 13.0 - 17.0 g/dL 11.0(L) 11.5(L) 11.5(L)  Hematocrit 39.0 - 52.0 % 34.2(L) 35.7(L) 36.5(L)  Platelets 150 - 400 K/uL 225 217 206     BMET Recent Labs    04/24/20 0440 04/25/20 0440  NA 135 135  K 4.3 4.0  CL 102 100  CO2 26 26  GLUCOSE 219* 183*  BUN 13 16  CREATININE 1.52* 1.60*  CALCIUM 9.6 9.4     Studies/Results: NM Pulmonary Perfusion  Result Date: 04/23/2020 CLINICAL DATA:  65 year old male with shortness of breath. EXAM: NUCLEAR MEDICINE PERFUSION LUNG SCAN TECHNIQUE: Perfusion images were obtained in multiple projections after intravenous injection of radiopharmaceutical. Ventilation  scans intentionally deferred if perfusion scan and chest x-ray adequate for interpretation during COVID 19 epidemic. RADIOPHARMACEUTICALS:  4.4 mCi Tc-2mMAA IV COMPARISON:  04/23/2020 chest radiograph FINDINGS: No which shaped perfusion defects are noted to suggest pulmonary emboli. IMPRESSION: Pulmonary embolism absent Electronically Signed   By: JMargarette CanadaM.D.   On: 04/23/2020 14:48    Pathology pending.  Assessment/Plan: POD # 4 s/p left robotic radical nephrectomy and cholecystectomy - Renal function stabilizing.  Will continue to follow at outpatient f/u. - Pathology pending - Urge incontinence: PVRs minimal and UA negative for infection.  This is likely a chronic issue that may be exacerbated by recent fluid hydration, etc. Nocturnal incontinence very likely exacerbated by significant amount of narcotic/benzodiazepine medication that he chronically takes.  No concerning findings on evaluation to suggest acute problem.  Will address further as an outpatient. - Confirm with case manager that HHPT is arranged   LOS: 2 days   LDutch Gray3/09/2020, 7:42 AM

## 2020-04-25 NOTE — Progress Notes (Signed)
Pt O2 saturation while on 2L [place]:97%  Pt O2 Saturation at rest on Room Air: 89% Pt O2 Saturation while ambulating on RA: 85%

## 2020-04-25 NOTE — Progress Notes (Signed)
PROGRESS NOTE    RODMAN RECUPERO  UEA:[id number] DOB: 10-24-1955 DOA: 04/23/2020 PCP: [name], MD    Chief Complaint  Patient presents with  . Post-op Problem    Brief Narrative:  Patient 65 year old gentleman history of coronary artery disease, chronic pain syndrome on chronic opioids, diabetes, hypertension, hyperlipidemia who underwent laparoscopic cholecystectomy 04/20/2020 and while this was being performed underwent left nephrectomy for 6 cm renal mass which was performed by general surgery, Dr. Johney Maine (general surgery) and urology, Dr. Alinda Money.  Patient was discharged 04/22/2020 however when he went home attempted to walk to the bathroom to urinate, got incontinent with significant weakness with inability to ambulate and fell to the floor due to weakness.  Patient with some complaints of abdominal pain.  Patient noted by wife to have a fever with a temp of 101.4 and noted to be hypoxic with sats in the 70s, placed on oxygen and brought to the ED. Patient seen in the ED CT abdomen and pelvis with 6 cm loculated fluid collection in the renal fossa, and the abdomen within expected limits, small pleural effusions with atelectasis.  Patient noted to have a creatinine of 2.0 from 0.8 preop.  Chest x-ray done negative.  COVID-19 PCR negative.  Patient admitted for further evaluation and management.  Urology notified of admission and following.   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  Patient noted to have sats of 80% on room air at rest, while awake and talking on presentation with slightly increased respiratory rate.  Chest x-ray done negative. -It is noted per urology that during recent hospitalization he was noted to be mildly hypoxic after ambulating and when sleeping but was 94 to 95% on room air when awake. -Patient does endorse a prior history of OSA and was on CPAP which was discontinued approximately 5 years ago. -VQ scan negative for PE. -Patient tolerating CPAP nightly with good adequate sleep per patient and wife. -Check ambulatory sats.   -Continue incentive spirometry, flutter valve.   -Continue CPAP nightly and when sleeping or taking a nap.   -We will need home O2 with close outpatient follow-up with PCP. -Supportive care.   -Follow.   2.  Acute kidney injury Creatinine elevated to 2.0 on admission, from 0.8 preoperatively.  Patient status post nephrectomy. -Patient seen by urology in consultation who feel elevated creatinine not unexpected and relatively stable since discharge.  Urology expecting renal function to stabilize and improve over the next couple of months.  Continue to hold Vasotec.  Renal function improved with hydration. IV fluids have been saline lock. Follow.    3.  Status post left radical nephrectomy (04/20/2020) Patient with complaints of urinary urgency and urge incontinence especially at night. Repeat UA negative for infection. Urology following and recommending outpatient follow-up. -Per urology.  4.  Chronic pain syndrome and opiate dependence -Continue home regimen oxycodone. -Continue IV as needed morphine. -  Continue as needed Flexeril and scheduled Neurontin. -Continue bowel regimen of MiraLAX twice daily, Senokot-S twice daily.   5.  Diabetes mellitus type 2 -Creatinine noted at 2.0 on presentation. -Metformin  discontinued will likely not resume on discharge. -Was on U500 prior to admission. -CBG 187 this morning. -Continue Lantus 45 units twice daily, new coverage, NovoLog 6 units 3 times daily with meals, SSI.  -Diabetes coordinator following.  6.  Hypertension Blood pressure noted to be borderline on presentation. Proved. Continue Toprol-XL, Cardizem. Continue to hold Vasotec. Follow.   7.  CAD Stable.  Continue aspirin, Effient, Toprol-XL, Cardizem.  Continue to hold Vasotec.  Continue statin.  Follow.   8.  Generalized weakness PT/OT.  9.  History of OSA Patient states slept well on CPAP overnight.  Patient states was on CPAP 5 years ago which was discontinued.  Continue CPAP nightly.  Outpatient follow-up to evaluate need for CPAP.  10.  Constipation Patient states has not had a bowel movement in 5 days however per RN patient had a bowel movement on 04/21/2020.  Patient on chronic opioid pain medication. Patient on MiraLAX twice daily, Senokot S twice daily, received a dose of sorbitol p.o. x1 and Dulcolax suppository with minimal stool noted. Mineral oil enema given early on today with no results yet.  SMOG enema x1. Will need bowel regimen on discharge.   11.  Low-grade temperature Patient noted to have a low-grade fever with a temp of 100.6 (04/23/2020).  May be secondary to atelectasis. Fever curve trended down. Afebrile. Normal white count. Chest x-ray negative for any infiltrate. Blood cultures ordered and pending with no growth to date. Repeat urinalysis nitrite negative, leukocytes negative. Continue pulmonary toilet. No need for antibiotics at this time. Follow.  12. Disposition Patient was to discharge home with home health however per OT and per RN spouse does not think she can adequately care for patient at his current level of function and requesting SNF placement. Will have PT reassess. SNF when bed available.    DVT prophylaxis: Lovenox Code Status: Full Family  Communication: Updated patient and wife at bedside. Disposition:   Status is: Inpatient    Dispo: The patient is from: Home              Anticipated d/c is to: Initially was to go home with home health however per RN family decided today unable to care for patient at home and patient needed increased assistance and as such want patient to go to a SNF.              Patient currently stable. Will likely need home O2. Was to be discharged home with home health however per RNs and OT family stating unable to care for patient at home and requesting patient be placed in SNF. Being evaluated for hypoxia, generalized weakness.  Not stable for discharge.   Difficult to place patient no       Consultants:   Urology: Dr. Alinda Money 04/23/2020  Procedures:   VQ scan 04/23/2020  CT abdomen and pelvis 04/23/2020  Chest x-ray 04/23/2020    Antimicrobials:   None   Subjective: Patient sitting up in bed eating lunch. Stated slept well on CPAP machine overnight. No chest pain. No shortness of breath. Minimal stool noted still with complaints of constipation. Wife at bedside. Afebrile. Patient with some complaints of urinary urgency and urge incontinence at night.  Objective: Vitals:   04/24/20 1506 04/24/20 2103 04/25/20 0500 04/25/20 0549  BP: Marland Kitchen)  168/80 (!) 146/84  134/85  Pulse: (!) 107 98  88  Resp: 20 20  20   Temp: (!) 97.5 F (36.4 C) 98.7 F (37.1 C)  98.7 F (37.1 C)  TempSrc:  Oral  Oral  SpO2: 95% 97%  100%  Weight:   96.9 kg   Height:        Intake/Output Summary (Last 24 hours) at 04/25/2020 1244 Last data filed at 04/25/2020 1130 Gross per 24 hour  Intake 290 ml  Output 575 ml  Net -285 ml   Filed Weights   04/23/20 0058 04/24/20 0408 04/25/20 0500  Weight: 97.5 kg 98.6 kg 96.9 kg    Examination:  General exam: NAD Respiratory system: Clear to auscultation bilaterally. No wheezes, no crackles, no rhonchi. Normal respiratory effort. Cardiovascular system: Regular rate  rhythm no murmurs rubs or gallops. No JVD. No lower extremity edema.  Gastrointestinal system: Abdomen is soft, mildly distended, some diffuse tenderness to palpation. Positive bowel sounds. Mid incision site c/d/i, port sites c/d/i. Central nervous system: Alert and oriented.  No focal neurological deficits.   Extremities: Symmetric 5 x 5 power. Skin: No rashes, lesions or ulcers Psychiatry: Judgement and insight appear normal. Mood & affect appropriate.     Data Reviewed: I have personally reviewed following labs and imaging studies  CBC: Recent Labs  Lab 04/21/20 0403 04/22/20 0409 04/23/20 0145 04/24/20 0440 04/25/20 0440  WBC  --   --  9.8 5.8 8.8  NEUTROABS  --   --  6.7 3.1  --   HGB 13.0 11.6* 11.5* 11.5* 11.0*  HCT 38.8* 36.6* 36.5* 35.7* 34.2*  MCV  --   --  87.3 85.4 85.3  PLT  --   --  206 217 329    Basic Metabolic Panel: Recent Labs  Lab 04/21/20 0403 04/22/20 0409 04/23/20 0145 04/24/20 0440 04/25/20 0440  NA 134* 134* 137 135 135  K 4.8 4.6 4.5 4.3 4.0  CL 102 100 103 102 100  CO2 24 30 26 26 26   GLUCOSE 399* 315* 371* 219* 183*  BUN 17 20 24* 13 16  CREATININE 1.60* 1.94* 2.07* 1.52* 1.60*  CALCIUM 8.9 9.1 9.5 9.6 9.4    GFR: Estimated Creatinine Clearance: 52.9 mL/min (A) (by C-G formula based on SCr of 1.6 mg/dL (H)).  Liver Function Tests: Recent Labs  Lab 04/23/20 0145  AST 25  ALT 24  ALKPHOS 67  BILITOT 0.5  PROT 6.6  ALBUMIN 3.3*    CBG: Recent Labs  Lab 04/24/20 1136 04/24/20 1634 04/24/20 2102 04/25/20 0710 04/25/20 1118  GLUCAP 279* 254* 212* 187* 262*     Recent Results (from the past 240 hour(s))  SARS CORONAVIRUS 2 (TAT 6-24 HRS) Nasopharyngeal Nasopharyngeal Swab     Status: None   Collection Time: 04/17/20 10:52 AM   Specimen: Nasopharyngeal Swab  Result Value Ref Range Status   SARS Coronavirus 2 NEGATIVE NEGATIVE Final    Comment: (NOTE) SARS-CoV-2 target nucleic acids are NOT DETECTED.  The SARS-CoV-2  RNA is generally detectable in upper and lower respiratory specimens during the acute phase of infection. Negative results do not preclude SARS-CoV-2 infection, do not rule out co-infections with other pathogens, and should not be used as the sole basis for treatment or other patient management decisions. Negative results must be combined with clinical observations, patient history, and epidemiological information. The expected result is Negative.  Fact Sheet for Patients: SugarRoll.be  Fact Sheet for Healthcare Providers: https://www.woods-mathews.com/  This test  is not yet approved or cleared by the [place] and  has been authorized for detection and/or diagnosis of SARS-CoV-2 by FDA under an Emergency Use Authorization (EUA). This EUA will remain  in effect (meaning this test can be used) for the duration of the COVID-19 declaration under Se ction 564(b)(1) of the Act, 21 U.S.C. section 360bbb-3(b)(1), unless the authorization is terminated or revoked sooner.  Performed at [facility], [place] [address], [place], [place] [address]   Resp Panel by RT-PCR (Flu A&B, Covid) Nasopharyngeal Swab     Status: None   Collection Time: 04/23/20  5:17 AM   Specimen: Nasopharyngeal Swab; Nasopharyngeal(NP) swabs in vial transport medium  Result Value Ref Range Status   SARS Coronavirus 2 by RT PCR NEGATIVE NEGATIVE Final    Comment: (NOTE) SARS-CoV-2 target nucleic acids are NOT DETECTED.  The SARS-CoV-2 RNA is generally detectable in upper respiratory specimens during the acute phase of infection. The lowest concentration of SARS-CoV-2 viral copies this assay can detect is 138 copies/mL. A negative result does not preclude SARS-Cov-2 infection and should not be used as the sole basis for treatment or other patient management decisions. A negative result may occur with  improper specimen collection/handling, submission of specimen  other than nasopharyngeal swab, presence of viral mutation(s) within the areas targeted by this assay, and inadequate number of viral copies(<138 copies/mL). A negative result must be combined with clinical observations, patient history, and epidemiological information. The expected result is Negative.  Fact Sheet for Patients:  EntrepreneurPulse.com.au  Fact Sheet for Healthcare Providers:  IncredibleEmployment.be  This test is no t yet approved or cleared by the [place] FDA and  has been authorized for detection and/or diagnosis of SARS-CoV-2 by FDA under an Emergency Use Authorization (EUA). This EUA will remain  in effect (meaning this test can be used) for the duration of the COVID-19 declaration under Section 564(b)(1) of the Act, 21 U.S.C.section 360bbb-3(b)(1), unless the authorization is terminated  or revoked sooner.       Influenza A by PCR NEGATIVE NEGATIVE Final   Influenza B by PCR NEGATIVE NEGATIVE Final    Comment: (NOTE) The Xpert Xpress SARS-CoV-2/FLU/RSV plus assay is intended as an aid in the diagnosis of influenza from Nasopharyngeal swab specimens and should not be used as a sole basis for treatment. Nasal washings and aspirates are unacceptable for Xpert Xpress SARS-CoV-2/FLU/RSV testing.  Fact Sheet for Patients: EntrepreneurPulse.com.au  Fact Sheet for Healthcare Providers: IncredibleEmployment.be  This test is not yet approved or cleared by the [place] FDA and has been authorized for detection and/or diagnosis of SARS-CoV-2 by FDA under an Emergency Use Authorization (EUA). This EUA will remain in effect (meaning this test can be used) for the duration of the COVID-19 declaration under Section 564(b)(1) of the Act, 21 U.S.C. section 360bbb-3(b)(1), unless the authorization is terminated or revoked.  Performed at [facility], [place] [address] [place], [place] [address]   Culture, blood (Routine X 2) w Reflex to ID Panel     Status: None (Preliminary result)   Collection Time: 04/24/20  8:32 AM   Specimen: BLOOD RIGHT HAND  Result Value Ref Range Status   Specimen Description   Final    BLOOD RIGHT HAND Performed at [place] [address] [place], [place] [address]    Special Requests   Final    BOTTLES DRAWN AEROBIC AND ANAEROBIC Blood Culture adequate volume Performed at [facility]  [facility], [place] [address] [place], [place] [address]    Culture   Final    NO GROWTH < 12 HOURS Performed at [place] [address] [place], [place] [address]    Report Status PENDING  Incomplete  Culture, blood (Routine X 2) w Reflex to ID Panel     Status: None (Preliminary result)   Collection Time: 04/24/20  8:37 AM   Specimen: BLOOD LEFT HAND  Result Value Ref Range Status   Specimen Description   Final    BLOOD LEFT HAND Performed at [place] [address] [place], [place] [address]    Special Requests   Final    BOTTLES DRAWN AEROBIC AND ANAEROBIC Blood Culture adequate volume Performed at [place] [address] [place], [place] [address]    Culture   Final    NO GROWTH < 12 HOURS Performed at [place] [address] [place], [place] [address]    Report Status PENDING  Incomplete  Culture, Urine     Status: Abnormal (Preliminary result)   Collection Time: 04/24/20 10:54 AM   Specimen: Urine, Clean Catch  Result Value Ref Range Status   Specimen Description   Final    URINE, CLEAN CATCH Performed at [facility], [place] [address] [place], [place] [address]    Special Requests   Final    NONE Performed at [facility], [place] [address] [place], [place] [address]    Culture (A)  Final    30,000 COLONIES/mL STAPHYLOCOCCUS EPIDERMIDIS SUSCEPTIBILITIES TO FOLLOW Performed at  [facility], [place] [address] [place], [place] [address]    Report Status PENDING  Incomplete         Radiology Studies: NM Pulmonary Perfusion  Result Date: 04/23/2020 CLINICAL DATA:  65 year old male with shortness of breath. EXAM: NUCLEAR MEDICINE PERFUSION LUNG SCAN TECHNIQUE: Perfusion images were obtained in multiple projections after intravenous injection of radiopharmaceutical. Ventilation scans intentionally deferred if perfusion scan and chest x-ray adequate for interpretation during COVID 19 epidemic. RADIOPHARMACEUTICALS:  4.4 mCi Tc-28mMAA IV COMPARISON:  04/23/2020 chest radiograph FINDINGS: No which shaped perfusion defects are noted to suggest pulmonary emboli. IMPRESSION: Pulmonary embolism absent Electronically Signed   By: JMargarette CanadaM.D.   On: 04/23/2020 14:48        Scheduled Meds: . ALPRAZolam  0.5 mg Oral QHS  . aspirin EC  81 mg Oral QHS  . diltiazem  240 mg Oral Daily  . enoxaparin (LOVENOX) injection  40 mg Subcutaneous Q24H  . ezetimibe  10 mg Oral Daily  . gabapentin  1,600 mg Oral BID  . insulin aspart  0-20 Units Subcutaneous TID WC  . insulin aspart  6 Units Subcutaneous TID WC  . insulin glargine  45 Units Subcutaneous BID  . loratadine  10 mg Oral Daily  . magnesium oxide  400 mg Oral Daily  . mouth rinse  15 mL Mouth Rinse q12n4p  . metoprolol succinate  100 mg Oral BID  . montelukast  10 mg Oral QHS  . niacin  500 mg Oral QHS  . pantoprazole  80 mg Oral BID  . polyethylene glycol  17 g Oral BID  . prasugrel  10 mg Oral Daily  . rosuvastatin  40 mg Oral QHS  . senna-docusate  1 tablet Oral BID  . sorbitol, milk of mag, mineral oil, glycerin (SMOG) enema  960 mL Rectal Once  Continuous Infusions:    LOS: 2 days    Time spent: 40 minutes     Irine Seal, MD [facility]   To contact the attending provider between 7A-7P or the covering provider during after hours 7P-7A, please log into the web site [email or web address]  and access using universal [place] password for that web site. If you do not have the password, please call the hospital operator.  04/25/2020, 12:44 PM

## 2020-04-25 NOTE — Plan of Care (Signed)
  Problem: Health Behavior/Discharge Planning: Goal: Ability to manage health-related needs will improve Outcome: Progressing   Problem: Clinical Measurements: Goal: Will remain free from infection Outcome: Progressing   Problem: Activity: Goal: Risk for activity intolerance will decrease Outcome: Progressing   Problem: Elimination: Goal: Will not experience complications related to bowel motility Outcome: Progressing   Problem: Pain Managment: Goal: General experience of comfort will improve Outcome: Progressing

## 2020-04-25 NOTE — NC FL2 (Signed)
[place] LEVEL OF CARE SCREENING TOOL     IDENTIFICATION  Patient Name: Kenneth Osborn Birthdate: [birth year] Sex: male Admission Date (Current Location): 04/23/2020  [facility] and [place] Number:  Herbalist and Address:  [facility],  [address], [place]      Provider Number: [id number]  Attending Physician Name and Address:  Eugenie Filler, MD  Relative Name and Phone Number:  Thelbert Gartin wife [phone]    Current Level of Care: Hospital Recommended Level of Care: [place] Prior Approval Number:    Date Approved/Denied:   PASRR Number: [id number]  Discharge Plan: SNF    Current Diagnoses: Patient Active Problem List   Diagnosis Date Noted  . Constipation 04/24/2020  . Low grade fever 04/24/2020  . Hypoxia   . Acute respiratory failure with hypoxia ([place]) 04/23/2020  . Status post nephrectomy   . 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 Acute on chronic cholecystitis s/p robotic cholecystectomy 04/20/2020 04/20/2020  . Elevated LFTs   . Renal mass   . Hypokalemia   . Chronic low back pain (Bilateral) w/o sciatica 12/16/2019  . Hyperglycemia 12/16/2019  . Pharmacologic therapy 08/03/2019  . Disorder of skeletal system 08/03/2019  . Problems influencing health status 08/03/2019  . Postop check 12/17/2018  . Acute postoperative pain 12/03/2018  . DDD (degenerative disc disease), lumbosacral 09/21/2018  . Diabetic polyneuropathy associated with diabetes mellitus due to underlying condition ([place]) 09/10/2018  . Neuropathic pain 09/10/2018  . Musculoskeletal pain 09/10/2018  . AKI (acute kidney injury) ([place]) 02/19/2018  . Frequent falls 02/07/2018  . Hyperkalemia 01/18/2018  . Spondylosis without myelopathy or radiculopathy, lumbosacral region 11/13/2017  . Cardiac syncope 09/08/2017  . History of coronary artery disease 09/03/2017  .  Spinal cord stimulator dysfunction ([place]) 12/26/2016  . Trigger point posterior superior iliac spine (PSIS) (Right) 12/25/2016  . 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 Polyneuropathy 12/18/2016  . Numbness and tingling 11/12/2016  . Tachycardia with heart rate 100-120 beats per minute 11/12/2016  . Meniere disease, bilateral 11/12/2016  . Headache disorder 11/12/2016  . Type 2 diabetes mellitus with diabetic neuropathy ([place]) 11/12/2016  . Type 2 diabetes mellitus with diabetic polyneuropathy, with long-term current use of insulin ([place]) 11/12/2016  . Tremor 09/11/2016  . Chronic pain syndrome 05/28/2016  . Bilateral carotid artery stenosis 01/24/2016  . Mild cognitive impairment 11/27/2015  . 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 Balance problem 10/09/2015  . Chronic tension-type headache, not intractable 10/09/2015  . Dizziness 10/09/2015  . Post-concussion headache 10/09/2015  . PSVT (paroxysmal supraventricular tachycardia) ([place]) 08/31/2015  . Diabetes mellitus, type II ([place]) 06/14/2015  . Unstable angina ([place]) 06/08/2015  . Encounter for interrogation of neurostimulator 03/22/2015  . [employer] spinal cord stimulator (implant date: 11/01/2014) 03/22/2015  . Type 2 diabetes mellitus with hyperglycemia ([place]) 03/15/2015  . Uncontrolled type 2 diabetes mellitus with hyperglycemia, with long-term current use of insulin ([place]) 03/15/2015  . DM type 2 with diabetic peripheral neuropathy ([place]) 03/15/2015  . Acid reflux 12/21/2014  . OSA (obstructive sleep apnea) 12/21/2014  . S/P insertion of spinal cord stimulator 12/21/2014  . Chronic low back pain (Bilateral) (L>R) w/ sciatica (Left) 12/21/2014  . 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 Beverely Pace at [facility] on 01/04/2013) 12/21/2014  . Long term current use of opiate analgesic 12/21/2014  . Long term prescription opiate use  12/21/2014  . Opiate use (60 MME/Day)  12/21/2014  . Opiate dependence ([place]) 12/21/2014  . Encounter for therapeutic drug level monitoring 12/21/2014  . Neurogenic pain 12/21/2014  . Thoracic facet syndrome (T8-10) 12/21/2014  . Lumbar facet syndrome (Bilateral) (R>L) 12/21/2014  . Cervical facet syndrome (Right) 12/21/2014  . Cervical spondylosis 12/21/2014  . Lumbar spondylosis 12/21/2014  . Chronic upper extremity pain (Left) 12/21/2014  . Chronic cervical radicular pain (Left) 12/21/2014  . Chronic upper back pain 12/21/2014  . History of thoracic spine surgery (S/P T9-10 IVD spacer) 12/21/2014  . 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 Chronic musculoskeletal pain 12/21/2014  . Myofascial pain 12/21/2014  . Chronic lower extremity pain (Left) 12/21/2014  . Chronic radicular lumbar pain (left L4 dermatomal pain) 12/21/2014  . Coronary artery disease 12/21/2014  . History of MI (myocardial infarction) (May 2012) 12/21/2014  . Chronic anticoagulation ([place]) 12/21/2014  . History of Meniere's disease 12/21/2014  . Sleep apnea 12/21/2014  . History of spinal surgery 11/14/2014  . Hypotension due to hypovolemia 09/22/2014  . Benign essential HTN 09/14/2014  . Hypertension associated with diabetes ([place]) 09/14/2014  . Barrett esophagus 08/12/2014  . H/O adenomatous polyp of colon 08/12/2014  . Dysphagia 08/12/2014  . GERD (gastroesophageal reflux disease) 01/25/2014  . Arteriosclerosis of coronary artery 08/13/2013  . Hyperlipidemia 08/13/2013  . CAD (coronary artery disease) 08/13/2013  . Hyperlipidemia associated with type 2 diabetes mellitus ([place]) 08/13/2013  . Auditory vertigo 12/30/2012  . Cervical spinal stenosis 12/11/2012  . Arthralgia of shoulder 03/16/2012    Orientation RESPIRATION BLADDER Height & Weight     Self,Time,Situation,Place  O2 (2L O2) Incontinent Weight: 96.9 kg Height:  5' 9"  (175.3 cm)  BEHAVIORAL SYMPTOMS/MOOD NEUROLOGICAL BOWEL NUTRITION STATUS      Continent Diet (Heart  Healthy, Carb modified)  AMBULATORY STATUS COMMUNICATION OF NEEDS Skin   Extensive Assist Verbally Surgical wounds (abdomen)                       Personal Care Assistance Level of Assistance  Bathing,Feeding,Dressing Bathing Assistance: Limited assistance Feeding assistance: Independent Dressing Assistance: Limited assistance     Functional Limitations Info  Sight,Hearing,Speech Sight Info: Adequate Hearing Info: Adequate Speech Info: Adequate    SPECIAL CARE FACTORS FREQUENCY  PT (By licensed PT),OT (By licensed OT)     PT Frequency: x5 week OT Frequency: x5 week            Contractures Contractures Info: Not present    Additional Factors Info  Code Status,Allergies Code Status Info: FULL Allergies Info: No Known Allergies           Current Medications (04/25/2020):  This is the current hospital active medication list Current Facility-Administered Medications  Medication Dose Route Frequency Provider Last Rate Last Admin  . acetaminophen (TYLENOL) tablet 650 mg  650 mg Oral Q6H PRN Etta Quill, DO   650 mg at 04/23/20 1941   Or  . acetaminophen (TYLENOL) suppository 650 mg  650 mg Rectal Q6H PRN Etta Quill, DO      . ALPRAZolam Duanne Moron) tablet 0.5 mg  0.5 mg Oral QHS Jennette Kettle M, DO   0.5 mg at 04/24/20 2112  . aspirin EC tablet 81 mg  81 mg Oral QHS Etta Quill, DO   81 mg at 04/24/20 2112  . cyclobenzaprine (FLEXERIL) tablet 10 mg  10 mg Oral TID PRN Etta Quill, DO      . diazepam (  VALIUM) tablet 5 mg  5 mg Oral TID PRN Etta Quill, DO      . diltiazem (CARDIZEM CD) 24 hr capsule 240 mg  240 mg Oral Daily Eugenie Filler, MD   240 mg at 04/25/20 0951  . enoxaparin (LOVENOX) injection 40 mg  40 mg Subcutaneous Q24H Jennette Kettle M, DO   40 mg at 04/25/20 0950  . ezetimibe (ZETIA) tablet 10 mg  10 mg Oral Daily Jennette Kettle M, DO   10 mg at 04/25/20 0951  . gabapentin (NEURONTIN) capsule 1,600 mg  1,600 mg Oral BID  Jennette Kettle M, DO   1,600 mg at 04/25/20 7096  . hydrocortisone (ANUSOL-HC) 2.5 % rectal cream 1 application  1 application Rectal Daily PRN Eugenie Filler, MD      . insulin aspart (novoLOG) injection 0-20 Units  0-20 Units Subcutaneous TID WC Etta Quill, DO   11 Units at 04/25/20 1228  . insulin aspart (novoLOG) injection 6 Units  6 Units Subcutaneous TID WC Etta Quill, DO   6 Units at 04/25/20 1228  . insulin glargine (LANTUS) injection 45 Units  45 Units Subcutaneous BID Eugenie Filler, MD   45 Units at 04/25/20 [phone]  . loratadine (CLARITIN) tablet 10 mg  10 mg Oral Daily Jennette Kettle M, DO   10 mg at 04/25/20 6294  . magnesium oxide (MAG-OX) tablet 400 mg  400 mg Oral Daily Jennette Kettle M, DO   400 mg at 04/25/20 7654  . meclizine (ANTIVERT) tablet 25 mg  25 mg Oral TID PRN Etta Quill, DO      . MEDLINE mouth rinse  15 mL Mouth Rinse q12n4p Eugenie Filler, MD   15 mL at 04/25/20 1229  . metoprolol succinate (TOPROL-XL) 24 hr tablet 100 mg  100 mg Oral BID Eugenie Filler, MD   100 mg at 04/25/20 6503  . montelukast (SINGULAIR) tablet 10 mg  10 mg Oral QHS Jennette Kettle M, DO   10 mg at 04/24/20 2112  . morphine 2 MG/ML injection 2-4 mg  2-4 mg Intravenous Q2H PRN Etta Quill, DO      . niacin (NIASPAN) CR tablet 500 mg  500 mg Oral QHS Jennette Kettle M, DO   500 mg at 04/24/20 2112  . ondansetron (ZOFRAN) tablet 4 mg  4 mg Oral Q6H PRN Etta Quill, DO       Or  . ondansetron [facility]) injection 4 mg  4 mg Intravenous Q6H PRN Etta Quill, DO      . oxyCODONE (Oxy IR/ROXICODONE) immediate release tablet 10 mg  10 mg Oral Q6H PRN Eugenie Filler, MD   10 mg at 04/25/20 1353  . pantoprazole (PROTONIX) EC tablet 80 mg  80 mg Oral BID Etta Quill, DO   80 mg at 04/25/20 5465  . polyethylene glycol (MIRALAX / GLYCOLAX) packet 17 g  17 g Oral BID Eugenie Filler, MD   17 g at 04/25/20 0951  . prasugrel (EFFIENT) tablet 10 mg  10 mg  Oral Daily Jennette Kettle M, DO   10 mg at 04/25/20 1044  . rosuvastatin (CRESTOR) tablet 40 mg  40 mg Oral QHS Jennette Kettle M, DO   40 mg at 04/24/20 2112  . senna-docusate (Senokot-S) tablet 1 tablet  1 tablet Oral BID Eugenie Filler, MD   1 tablet at 04/25/20 6812     Discharge Medications: Please see discharge summary for  a list of discharge medications.  Relevant Imaging Results:  Relevant Lab Results:   Additional Information [phone]  Purcell Mouton, RN

## 2020-04-25 NOTE — TOC Progression Note (Signed)
Transition of Care [facility]) - Progression Note    Patient Details  Name: Kenneth Osborn MRN: [id number] Date of Birth: 1955-09-01  Transition of Care [facility]) CM/SW Contact  Purcell Mouton, RN Phone Number: 04/25/2020, 12:17 PM  Clinical Narrative:     Pt will discharge back home with [facility].   Expected Discharge Plan: [place] Barriers to Discharge: No Barriers Identified  Expected Discharge Plan and Services Expected Discharge Plan: [place]   Discharge Planning Services: CM Consult   Living arrangements for the past 2 months: [place] Agency: [place] Date [place]: 04/25/20 Time [place]: 1217 Representative spoke with at [place]: [place] Determinants of Health ([place]) Interventions    Readmission Risk Interventions No flowsheet data found.

## 2020-04-25 NOTE — TOC Progression Note (Signed)
Transition of Care [facility]) - Progression Note    Patient Details  Name: Kenneth Osborn MRN: [id number] Date of Birth: 1955-12-14  Transition of Care [facility]) CM/SW Contact  Purcell Mouton, RN Phone Number: 04/25/2020, 12:48 PM  Clinical Narrative:    Spoke with Pt and wife concerning Oxygen, [facility] was selected. Referral given to [place].     Expected Discharge Plan: [place] Barriers to Discharge: No Barriers Identified  Expected Discharge Plan and Services Expected Discharge Plan: [place]   Discharge Planning Services: CM Consult   Living arrangements for the past 2 months: [place] Agency: [place] Date [place]: 04/25/20 Time [place]: 1217 Representative spoke with at [place]: [place] ([place]) Interventions    Readmission Risk Interventions Readmission Risk Prevention Plan 04/25/2020  [place] or Home Care Consult Complete  Some recent data might be hidden

## 2020-04-25 NOTE — TOC Progression Note (Signed)
Transition of Care [facility]) - Progression Note    Patient Details  Name: Kenneth Osborn MRN: [id number] Date of Birth: 12/15/1955  Transition of [facility]) CM/SW Contact  Purcell Mouton, RN Phone Number: 04/25/2020, 4:08 PM  Clinical Narrative:    Pt's wife asked for [facility] in [place]. [facility] was called to review clinicals and accepted pt. Wife is aware.    Expected Discharge Plan: [place] Barriers to Discharge: No Barriers Identified  Expected Discharge Plan and Services Expected Discharge Plan: [place]   Discharge Planning Services: CM Consult   Living arrangements for the past 2 months: [place] Agency: [place] Date [place]: 04/25/20 Time [place]: 1217 Representative spoke with at [place]: [place] ([place]) Interventions    Readmission Risk Interventions Readmission Risk Prevention Plan 04/25/2020  [place] or Home Care Consult Complete  Some recent data might be hidden

## 2020-04-26 DIAGNOSIS — J9601 Acute respiratory failure with hypoxia: Secondary | ICD-10-CM | POA: Diagnosis not present

## 2020-04-26 LAB — BASIC METABOLIC PANEL
Anion gap: 12 (ref 5–15)
BUN: 19 mg/dL (ref 8–23)
CO2: 26 mmol/L (ref 22–32)
Calcium: 9.7 mg/dL (ref 8.9–10.3)
Chloride: 102 mmol/L (ref 98–111)
Creatinine, Ser: 1.64 mg/dL — ABNORMAL HIGH (ref 0.61–1.24)
GFR, Estimated: 46 mL/min — ABNORMAL LOW (ref >60)
Glucose, Bld: 155 mg/dL — ABNORMAL HIGH (ref 70–99)
Potassium: 4.2 mmol/L (ref 3.5–5.1)
Sodium: 140 mmol/L (ref 135–145)

## 2020-04-26 LAB — CBC
HCT: 35.9 % — ABNORMAL LOW (ref 39.0–52.0)
Hemoglobin: 11.3 g/dL — ABNORMAL LOW (ref 13.0–17.0)
MCH: 27 pg (ref 26.0–34.0)
MCHC: 31.5 g/dL (ref 30.0–36.0)
MCV: 85.7 fL (ref 80.0–100.0)
Platelets: 272 10*3/uL (ref 150–400)
RBC: 4.19 MIL/uL — ABNORMAL LOW (ref 4.22–5.81)
RDW: 13.7 % (ref 11.5–15.5)
WBC: 10.5 10*3/uL (ref 4.0–10.5)
nRBC: 0 % (ref 0.0–0.2)

## 2020-04-26 LAB — URINE CULTURE: Culture: 30000 — AB

## 2020-04-26 LAB — GLUCOSE, CAPILLARY
Glucose-Capillary: 159 mg/dL — ABNORMAL HIGH (ref 70–99)
Glucose-Capillary: 371 mg/dL — ABNORMAL HIGH (ref 70–99)

## 2020-04-26 LAB — SARS CORONAVIRUS 2 (TAT 6-24 HRS): SARS Coronavirus 2: NEGATIVE

## 2020-04-26 MED ORDER — DIAZEPAM 5 MG PO TABS: 5.0000 mg | ORAL_TABLET | ORAL | 0 refills | Status: DC

## 2020-04-26 MED ORDER — POLYETHYLENE GLYCOL 3350 17 G PO PACK: 17.0000 g | PACK | Freq: Two times a day (BID) | ORAL | 0 refills | Status: DC

## 2020-04-26 MED ORDER — OXYCODONE HCL 10 MG PO TABS: 10.0000 mg | ORAL_TABLET | Freq: Four times a day (QID) | ORAL | 0 refills | Status: DC | PRN

## 2020-04-26 MED ORDER — CYCLOBENZAPRINE HCL 10 MG PO TABS: 10.0000 mg | ORAL_TABLET | Freq: Three times a day (TID) | ORAL | 0 refills | Status: AC | PRN

## 2020-04-26 MED ORDER — ALPRAZOLAM 0.5 MG PO TABS: 0.5000 mg | ORAL_TABLET | Freq: Every day | ORAL | 0 refills | Status: AC

## 2020-04-26 MED ORDER — SENNOSIDES-DOCUSATE SODIUM 8.6-50 MG PO TABS: 1.0000 | ORAL_TABLET | Freq: Two times a day (BID) | ORAL | 0 refills | Status: AC

## 2020-04-26 NOTE — Progress Notes (Signed)
Patient ID: Kenneth Osborn, male   DOB: 1955-08-31, 65 y.o.   MRN: [id number]    Subjective: No new complaints.  Still requiring oxygen and still difficulty with ambulation.  Working with PT/OT.  Now discussion that he may need SNF.  Objective: Vital signs in last 24 hours: Temp:  [98.3 F (36.8 C)-98.5 F (36.9 C)] 98.3 F (36.8 C) (03/09 0431) Pulse Rate:  [89-94] 89 (03/09 0431) Resp:  [18-20] 20 (03/09 0431) BP: (130-146)/(67-82) 146/78 (03/09 0431) SpO2:  [94 %-99 %] 96 % (03/09 0431) Weight:  [96.4 kg] 96.4 kg (03/09 0500)  Intake/Output from previous day: 03/08 0701 - 03/09 0700 In: 240 [P.O.:240] Out: 1525 [Urine:1525] Intake/Output this shift: Total I/O In: 240 [P.O.:240] Out: -   Physical Exam:  General: Alert and oriented   Lab Results: Recent Labs    04/24/20 0440 04/25/20 0440 04/26/20 0404  HGB 11.5* 11.0* 11.3*  HCT 35.7* 34.2* 35.9*   BMET Recent Labs    04/25/20 0440 04/26/20 0404  NA 135 140  K 4.0 4.2  CL 100 102  CO2 26 26  GLUCOSE 183* 155*  BUN 16 19  CREATININE 1.60* 1.64*  CALCIUM 9.4 9.7     Studies/Results: Pathology: pT1b Nx Mx, 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  Prognosis is good.  Will arrange surveillance following his upcoming post-op appt.   LOS: 3 days   [name] 04/26/2020, 9:56 AM

## 2020-04-26 NOTE — TOC Progression Note (Signed)
Transition of Care [facility]) - Progression Note    Patient Details  Name: SHRIYANS KUENZI MRN: [id number] Date of Birth: 12-11-1955  Transition [facility]) CM/SW Contact  Purcell Mouton, RN Phone Number: 04/26/2020, 12:10 PM  Clinical Narrative:    Corey Harold was called. RN aware.    Expected Discharge Plan: [place] Barriers to Discharge: No Barriers Identified  Expected Discharge Plan and Services Expected Discharge Plan: [place]   Discharge Planning Services: CM Consult   Living arrangements for the past 2 months: Single Family Home Expected Discharge Date: 04/26/20                           [place] Date [place]: 04/25/20 Time [place]: 1217 Representative spoke with at [place]: [place] ([place]) Interventions    Readmission Risk Interventions Readmission Risk Prevention Plan 04/25/2020  [place] or Home Care Consult Complete  Some recent data might be hidden

## 2020-04-26 NOTE — TOC Progression Note (Signed)
Transition of Care [facility]) - Progression Note    Patient Details  Name: [name] MRN: [id number] Date of Birth: [birth year]  Transition of Care [facility]) CM/SW Contact  Purcell Mouton, RN Phone Number: 04/26/2020, 11:28 AM  Clinical Narrative:    [facility] [place] accepted pt. Bed is ready, waiting on discharge summary.    Expected Discharge Plan: [place] Barriers to Discharge: No Barriers Identified  Expected Discharge Plan and Services Expected Discharge Plan: [place]   Discharge Planning Services: CM Consult   Living arrangements for the past 2 months: Single Family Home Expected Discharge Date: 04/26/20                           [place] Date [place]: 04/25/20 Time [place]: 1217 Representative spoke with at [place]: [place] ([place]) Interventions    Readmission Risk Interventions Readmission Risk Prevention Plan 04/25/2020  [place] or Home Care Consult Complete  Some recent data might be hidden

## 2020-04-26 NOTE — Plan of Care (Signed)
  Problem: Clinical Measurements: Goal: Will remain free from infection Outcome: Progressing   Problem: Pain Managment: Goal: General experience of comfort will improve Outcome: Progressing   Problem: Safety: Goal: Ability to remain free from injury will improve Outcome: Progressing   Problem: Skin Integrity: Goal: Risk for impaired skin integrity will decrease Outcome: Progressing

## 2020-04-26 NOTE — Discharge Summary (Signed)
Physician Discharge Summary  Kenneth Osborn:[id number] DOB: 11/08/1955 DOA: 04/23/2020  PCP: [name], MD  Admit date: 04/23/2020 Discharge date: 04/26/2020  Admitted From: Home Disposition:  SNF  Recommendations for Outpatient Follow-up:  1. Follow up with PCP in 1-2 weeks 2. Please obtain BMP/CBC in one week 3. Please follow up on the following pending results:  Discharge Condition: Stable  CODE STATUS:Full  Diet recommendation:  Diabetic  Brief/Interim Summary: Patient 65 year old gentleman history of coronary artery disease, chronic pain syndrome on chronic opioids, diabetes, hypertension, hyperlipidemia who underwent laparoscopic cholecystectomy 04/20/2020 and while this was being performed underwent left nephrectomy for 6 cm renal mass which was performed by general surgery, Dr. Johney Maine (general surgery) and urology, Dr. Alinda Money.  Patient was discharged 04/22/2020 however when he went home attempted to walk to the bathroom to urinate, got incontinent with significant weakness with inability to ambulate and fell to the floor due to weakness.  Patient with some complaints of abdominal pain.  Patient noted by wife to have a fever with a temp of 101.4 and noted to be hypoxic with sats in the 70s, placed on oxygen and brought to the ED. Patient seen in the ED CT abdomen and pelvis with 6 cm loculated fluid collection in the renal fossa, and the abdomen within expected limits, small pleural effusions with atelectasis.  Patient noted to have a creatinine of 2.0 from 0.8 preop.  Chest x-ray done negative.  COVID-19 PCR negative.  Patient admitted for further evaluation and management.  Urology notified of admission and following.  To have acute hypoxic respiratory failure with unclear etiology, appears to be improving daily, likely in the setting of sleep apnea however V/Q scan negative, chest x-ray unremarkable for acute opacifications or effusions.  Ambulatory status remains minimally hypoxic on  room air, continue CPAP at this point.  Patient's AKI appears to be resolving as well but not yet back to baseline.  Patient had radical left nephrectomy on 04/20/2020 and tolerated well.  Postsurgical follow-up with urology as scheduled.  Patient's other chronic comorbid conditions appear to be stable, continue home medications as below.  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   Acute on chronic cholecystitis s/p robotic cholecystectomy 04/20/2020   Status post nephrectomy   Constipation   Low grade fever   Hypoxia    Discharge Instructions  Discharge Instructions    Call MD for:  redness, tenderness, or signs of infection (pain, swelling, redness, odor or green/yellow discharge around incision site)   Complete by: As directed    Call MD for:  temperature >100.4   Complete by: As directed    Diet Carb Modified   Complete by: As directed    Discharge wound care:   Complete by: As directed    Per specialist   Increase activity slowly   Complete by: As directed      Allergies as of 04/26/2020   No Known Allergies     Medication List    STOP taking these medications   docusate sodium 100 MG capsule Commonly known as: COLACE   enalapril 10 MG tablet Commonly known  as: VASOTEC   isosorbide mononitrate 30 MG 24 hr tablet Commonly known as: IMDUR     TAKE these medications   ALPRAZolam 0.5 MG tablet Commonly known as: XANAX Take 1 tablet (0.5 mg total) by mouth at bedtime.   aspirin EC 81 MG tablet Take 81 mg by mouth at bedtime. Swallow whole.   BAQSIMI ONE PACK NA Place  1 Dose into the nose daily as needed (severely low blood sugar).   cetirizine 10 MG tablet Commonly known as: ZYRTEC Take 10 mg by mouth daily.   cyclobenzaprine 10 MG tablet Commonly known as: FLEXERIL Take 1 tablet (10 mg total) by mouth 3 (three) times daily as needed for muscle spasms.   diazepam 5 MG tablet Commonly known as: VALIUM Take 1 tablet (5 mg total) by mouth See admin instructions. Take 5 mg in the morning and additional if needed for Meniere's disease up to three a day   diltiazem 240 MG 24 hr capsule Commonly known as: CARDIZEM CD Take 240 mg by mouth daily.   esomeprazole 40 MG capsule Commonly known as: NEXIUM Take 40 mg by mouth 2 (two) times daily.   ezetimibe 10 MG tablet Commonly known as: ZETIA Take 10 mg by mouth daily.   gabapentin 800 MG tablet Commonly known as: NEURONTIN Take 1,600 mg by mouth 2 (two) times daily.   HumuLIN R U-500 KwikPen 500 UNIT/ML kwikpen Generic drug: insulin regular human CONCENTRATED Inject 40 Units into the skin 3 (three) times daily with meals. What changed:   how much to take  when to take this  additional instructions   hydrocortisone 2.5 % cream Apply 1 application topically daily as needed for hemorrhoids.   Magnesium Oxide 500 MG Tabs Take 500 mg by mouth daily.   meclizine 25 MG tablet Commonly known as: ANTIVERT Take 25 mg by mouth See admin instructions. Take 25 mg in the morning, and additional 25 mg if needed up to a total of three per day for Meniere's disease   metoprolol succinate 100 MG 24 hr tablet Commonly known as: TOPROL-XL Take 100 mg by mouth 2 (two) times daily. Take with or immediately following a meal.   montelukast 10 MG tablet Commonly known as: SINGULAIR Take 10 mg by mouth at bedtime.   niacin 500 MG CR tablet Commonly known as: NIASPAN Take 500 mg by mouth at bedtime.   Oxycodone HCl 10 MG Tabs Take 1 tablet (10 mg total) by mouth every 6 (six) hours as needed. Must last  30 days   Oxycodone HCl 10 MG Tabs Take 1 tablet (10 mg total) by mouth every 6 (six) hours as needed. Must last 30 days   Oxycodone HCl 10 MG Tabs Take 1 tablet (10 mg total) by mouth every 6 (six) hours as needed. Must last 30 days Start taking on: May 24, 2020   polyethylene glycol 17 g packet Commonly known as: MIRALAX / GLYCOLAX Take 17 g by mouth 2 (two) times daily.   prasugrel 10 MG Tabs tablet Commonly known as: EFFIENT Take 10 mg by mouth daily.   rosuvastatin 40 MG tablet Commonly known as: CRESTOR Take 40 mg by mouth at bedtime.   senna-docusate 8.6-50 MG tablet Commonly known as: Senokot-S Take 1 tablet by mouth 2 (two) times daily.   sodium chloride 0.9 % injection Flush the drain daily with 5 ml            Durable Medical Equipment  (From admission, onward)  Start     Ordered   04/24/20 1706  For home use only DME oxygen  Once       Question Answer Comment  Length of Need 6 Months   Mode or (Route) Nasal cannula   Liters per Minute 2   Frequency Continuous (stationary and portable oxygen unit needed)   Oxygen conserving device Yes   Oxygen delivery system Gas      04/24/20 1705           Discharge Care Instructions  (From admission, onward)         Start     Ordered   04/26/20 0000  Discharge wound care:       Comments: Per specialist   04/26/20 [place] Follow up.   Why: Oxygen from [place]. Please call [facility] if you have any questons or concerns.  Contact information: [place] [address] [phone]        Care, [facility] Follow up.   Why: Please call if you have any questions or concerns about Home Care. Contact information: [place] [address] [phone]              No Known Allergies  Consultations:  Urology   Procedures/Studies: CT ABDOMEN PELVIS WO CONTRAST  Result Date:  04/23/2020 CLINICAL DATA:  Status post nephrectomy and cholecystectomy. Abdominal distension, rebound tenderness EXAM: CT ABDOMEN AND PELVIS WITHOUT CONTRAST TECHNIQUE: Multidetector CT imaging of the abdomen and pelvis was performed following the standard protocol without IV contrast. COMPARISON:  03/07/2020 FINDINGS: Lower chest: Small right pleural effusion is present with compressive atelectasis of the right lower lobe. Mild left basilar atelectasis. Coronary artery stenting has been performed. Cardiac size within normal limits. Trace pericardial effusion is likely physiologic. Hepatobiliary: Status post cholecystectomy and removal of the cholecystostomy catheter. Poorly circumscribed fluid within the gallbladder fossa. No intra or extrahepatic biliary ductal dilation. Liver unremarkable. Pancreas: Unremarkable Spleen: Unremarkable Adrenals/Urinary Tract: Interval left nephrectomy with small loculated fluid collection within the nephrectomy bed measuring 3.6 x 6.3 cm. This abuts and obscures the left adrenal gland. Right adrenal gland and kidney are unremarkable. Bladder is unremarkable. Stomach/Bowel: Stomach, small bowel, and large bowel are unremarkable. Appendix absent. Trace perihepatic ascites. Punctate foci of free intraperitoneal gas within the abdomen noted anteriorly, likely postsurgical in nature. Subcutaneous gas within the anterior abdominal wall within the suprapubic region is also likely postsurgical in nature. Vascular/Lymphatic: Mild aortoiliac atherosclerotic calcification. No aortic aneurysm. No pathologic adenopathy within the abdomen and pelvis. Reproductive: Prostate is unremarkable. Other: Tiny fat containing umbilical hernia. Infiltration within the anterior abdominal wall may be postsurgical in nature and appears unchanged from prior examination. Rectum unremarkable. Musculoskeletal: No acute bone abnormality. IMPRESSION: Interval left nephrectomy with 6.3 cm loculated fluid collection  within the nephrectomy bed. Interval cholecystectomy with poorly circumscribed fluid within the gallbladder fossa and trace perihepatic ascites. Punctate foci of gas within the peritoneum and subcutaneous gas within the suprapubic region within expectation given recent laparoscopic surgery. Small right pleural effusion with bibasilar atelectasis. Aortic Atherosclerosis (ICD10-I70.0). Electronically Signed   By: Fidela Salisbury MD   On: 04/23/2020 02:30   NM Pulmonary Perfusion  Result Date: 04/23/2020 CLINICAL DATA:  65 year old male with shortness of breath. EXAM: NUCLEAR MEDICINE PERFUSION LUNG SCAN TECHNIQUE: Perfusion images were obtained in multiple projections after intravenous injection of radiopharmaceutical. Ventilation scans  intentionally deferred if perfusion scan and chest x-ray adequate for interpretation during COVID 19 epidemic. RADIOPHARMACEUTICALS:  4.4 mCi Tc-64mMAA IV COMPARISON:  04/23/2020 chest radiograph FINDINGS: No which shaped perfusion defects are noted to suggest pulmonary emboli. IMPRESSION: Pulmonary embolism absent Electronically Signed   By: JMargarette CanadaM.D.   On: 04/23/2020 14:48   DG Chest Port 1 View  Result Date: 04/23/2020 CLINICAL DATA:  Weakness, hypoxia EXAM: PORTABLE CHEST 1 VIEW COMPARISON:  02/13/2020 FINDINGS: Lung volumes are small and there is mild elevation of the right hemidiaphragm with asymmetric right basilar atelectasis. No superimposed focal pulmonary infiltrate. No pneumothorax or pleural effusion. Cardiac size within normal limits. Pulmonary vascularity normal. No acute bone abnormality. IMPRESSION: Pulmonary hypoinflation Electronically Signed   By: AFidela SalisburyMD   On: 04/23/2020 02:19      Subjective: No acute issues or events overnight   Discharge Exam: Vitals:   04/25/20 2133 04/26/20 0431  BP: 134/72 (!) 146/78  Pulse: 94 89  Resp: 18 20  Temp: 98.5 F (36.9 C) 98.3 F (36.8 C)  SpO2: 97% 96%   Vitals:   04/25/20 1946 04/25/20  2133 04/26/20 0431 04/26/20 0500  BP: 130/82 134/72 (!) 146/78   Pulse: 93 94 89   Resp: 20 18 20    Temp: 98.3 F (36.8 C) 98.5 F (36.9 C) 98.3 F (36.8 C)   TempSrc: Oral Oral Oral   SpO2: 94% 97% 96%   Weight:    96.4 kg  Height:        General: Pt is alert, awake, not in acute distress Cardiovascular: RRR, S1/S2 +, no rubs, no gallops Respiratory: CTA bilaterally, no wheezing, no rhonchi Abdominal: Soft, NT, ND, bowel sounds + incision site and port site bandages clean dry intact Extremities: no edema, no cyanosis    The results of significant diagnostics from this hospitalization (including imaging, microbiology, ancillary and laboratory) are listed below for reference.     Microbiology: Recent Results (from the past 240 hour(s))  SARS CORONAVIRUS 2 (TAT 6-24 HRS) Nasopharyngeal Nasopharyngeal Swab     Status: None   Collection Time: 04/17/20 10:52 AM   Specimen: Nasopharyngeal Swab  Result Value Ref Range Status   SARS Coronavirus 2 NEGATIVE NEGATIVE Final    Comment: (NOTE) SARS-CoV-2 target nucleic acids are NOT DETECTED.  The SARS-CoV-2 RNA is generally detectable in upper and lower respiratory specimens during the acute phase of infection. Negative results do not preclude SARS-CoV-2 infection, do not rule out co-infections with other pathogens, and should not be used as the sole basis for treatment or other patient management decisions. Negative results must be combined with clinical observations, patient history, and epidemiological information. The expected result is Negative.  Fact Sheet for Patients: hSugarRoll.be Fact Sheet for Healthcare Providers: hhttps://www.woods-mathews.com/ This test is not yet approved or cleared by the [place] and  has been authorized for detection and/or diagnosis of SARS-CoV-2 by FDA under an Emergency Use Authorization (EUA). This EUA will remain  in effect (meaning this  test can be used) for the duration of the COVID-19 declaration under Se ction 564(b)(1) of the Act, 21 U.S.C. section 360bbb-3(b)(1), unless the authorization is terminated or revoked sooner.  Performed at [facility] [address] [place] [address]  Resp Panel by RT-PCR (Flu A&B, Covid) Nasopharyngeal Swab     Status: None   Collection Time: 04/23/20  5:17 AM   Specimen: Nasopharyngeal Swab; Nasopharyngeal(NP) swabs in vial  transport medium  Result Value Ref Range Status   SARS Coronavirus 2 by RT PCR NEGATIVE NEGATIVE Final    Comment: (NOTE) SARS-CoV-2 target nucleic acids are NOT DETECTED.  The SARS-CoV-2 RNA is generally detectable in upper respiratory specimens during the acute phase of infection. The lowest concentration of SARS-CoV-2 viral copies this assay can detect is 138 copies/mL. A negative result does not preclude SARS-Cov-2 infection and should not be used as the sole basis for treatment or other patient management decisions. A negative result may occur with  improper specimen collection/handling, submission of specimen other than nasopharyngeal swab, presence of viral mutation(s) within the areas targeted by this assay, and inadequate number of viral copies(<138 copies/mL). A negative result must be combined with clinical observations, patient history, and epidemiological information. The expected result is Negative.  Fact Sheet for Patients:  EntrepreneurPulse.com.au  Fact Sheet for Healthcare Providers:  IncredibleEmployment.be  This test is no t yet approved or cleared by the [place] FDA and  has been authorized for detection and/or diagnosis of SARS-CoV-2 by FDA under an Emergency Use Authorization (EUA). This EUA will remain  in effect (meaning this test can be used) for the duration of the COVID-19 declaration under Section 564(b)(1) of the Act, 21 U.S.C.section 360bbb-3(b)(1), unless the  authorization is terminated  or revoked sooner.       Influenza A by PCR NEGATIVE NEGATIVE Final   Influenza B by PCR NEGATIVE NEGATIVE Final    Comment: (NOTE) The Xpert Xpress SARS-CoV-2/FLU/RSV plus assay is intended as an aid in the diagnosis of influenza from Nasopharyngeal swab specimens and should not be used as a sole basis for treatment. Nasal washings and aspirates are unacceptable for Xpert Xpress SARS-CoV-2/FLU/RSV testing.  Fact Sheet for Patients: EntrepreneurPulse.com.au  Fact Sheet for Healthcare Providers: IncredibleEmployment.be  This test is not yet approved or cleared by the [place] FDA and has been authorized for detection and/or diagnosis of SARS-CoV-2 by FDA under an Emergency Use Authorization (EUA). This EUA will remain in effect (meaning this test can be used) for the duration of the COVID-19 declaration under Section 564(b)(1) of the Act, 21 U.S.C. section 360bbb-3(b)(1), unless the authorization is terminated or revoked.  Performed at [facility], [place] [address] [place], [place] [address]   Culture, blood (Routine X 2) w Reflex to ID Panel     Status: None (Preliminary result)   Collection Time: 04/24/20  8:32 AM   Specimen: BLOOD RIGHT HAND  Result Value Ref Range Status   Specimen Description   Final    BLOOD RIGHT HAND Performed at [facility] [address], [place], [place] [address]    Special Requests   Final    BOTTLES DRAWN AEROBIC AND ANAEROBIC Blood Culture adequate volume Performed at [facility] [address] [place], [place] [address]    Culture   Final    NO GROWTH 1 DAY Performed at [facility], [place] [address] [place], [place] [address]    Report Status PENDING  Incomplete  Culture, blood (Routine X 2) w Reflex to ID Panel     Status: None (Preliminary result)   Collection Time: 04/24/20  8:37 AM   Specimen:  BLOOD LEFT HAND  Result Value Ref Range Status   Specimen Description   Final    BLOOD LEFT HAND Performed at [facility] [address], [place],  [address]    Special Requests   Final  BOTTLES DRAWN AEROBIC AND ANAEROBIC Blood Culture adequate volume Performed at [facility] [address], [place], [place] [address]    Culture   Final    NO GROWTH 1 DAY Performed at [facility], [place] [address], [place], [place] [address]    Report Status PENDING  Incomplete  Culture, Urine     Status: Abnormal   Collection Time: 04/24/20 10:54 AM   Specimen: Urine, Clean Catch  Result Value Ref Range Status   Specimen Description   Final    URINE, CLEAN CATCH Performed at [facility], [place] [address], [place], [place] [address]    Special Requests   Final    NONE Performed at [facility], [place] [address], [place], [place] [address]    Culture 30,000 COLONIES/mL STAPHYLOCOCCUS EPIDERMIDIS (A)  Final   Report Status 04/26/2020 FINAL  Final   Organism ID, Bacteria STAPHYLOCOCCUS EPIDERMIDIS (A)  Final      Susceptibility   Staphylococcus epidermidis - MIC*    CIPROFLOXACIN >=8 RESISTANT Resistant     GENTAMICIN <=0.5 SENSITIVE Sensitive     NITROFURANTOIN <=16 SENSITIVE Sensitive     OXACILLIN >=4 RESISTANT Resistant     TETRACYCLINE 2 SENSITIVE Sensitive     VANCOMYCIN <=0.5 SENSITIVE Sensitive     TRIMETH/SULFA 80 RESISTANT Resistant     CLINDAMYCIN >=8 RESISTANT Resistant     RIFAMPIN <=0.5 SENSITIVE Sensitive     Inducible Clindamycin NEGATIVE Sensitive     * 30,000 COLONIES/mL STAPHYLOCOCCUS EPIDERMIDIS  SARS CORONAVIRUS 2 (TAT 6-24 HRS) Nasopharyngeal Nasopharyngeal Swab     Status: None   Collection Time: 04/25/20  3:41 PM   Specimen: Nasopharyngeal Swab  Result Value Ref Range Status   SARS Coronavirus 2 NEGATIVE NEGATIVE Final    Comment: (NOTE) SARS-CoV-2 target nucleic acids are  NOT DETECTED.  The SARS-CoV-2 RNA is generally detectable in upper and lower respiratory specimens during the acute phase of infection. Negative results do not preclude SARS-CoV-2 infection, do not rule out co-infections with other pathogens, and should not be used as the sole basis for treatment or other patient management decisions. Negative results must be combined with clinical observations, patient history, and epidemiological information. The expected result is Negative.  Fact Sheet for Patients: SugarRoll.be  Fact Sheet for Healthcare Providers: https://www.woods-mathews.com/  This test is not yet approved or cleared by the [place] FDA and  has been authorized for detection and/or diagnosis of SARS-CoV-2 by FDA under an Emergency Use Authorization (EUA). This EUA will remain  in effect (meaning this test can be used) for the duration of the COVID-19 declaration under Se ction 564(b)(1) of the Act, 21 U.S.C. section 360bbb-3(b)(1), unless the authorization is terminated or revoked sooner.  Performed at [facility], [place] [address] [place],  [address]      Labs: BNP (last 3 results) No results for input(s): BNP in the last 8760 hours. Basic Metabolic Panel: Recent Labs  Lab 04/22/20 0409 04/23/20 0145 04/24/20 0440 04/25/20 0440 04/26/20 0404  NA 134* 137 135 135 140  K 4.6 4.5 4.3 4.0 4.2  CL 100 103 102 100 102  CO2 30 26 26 26 26   GLUCOSE 315* 371* 219* 183* 155*  BUN 20 24* 13 16 19   CREATININE 1.94* 2.07* 1.52* 1.60* 1.64*  CALCIUM 9.1 9.5 9.6 9.4 9.7   Liver Function Tests: Recent Labs  Lab 04/23/20 0145  AST 25  ALT 24  ALKPHOS  67  BILITOT 0.5  PROT 6.6  ALBUMIN 3.3*   Recent Labs  Lab 04/23/20 0145  LIPASE 22   No results for input(s): AMMONIA in the last 168 hours. CBC: Recent Labs  Lab 04/22/20 0409 04/23/20 0145 04/24/20 0440 04/25/20 0440 04/26/20 0404  WBC  --  9.8  5.8 8.8 10.5  NEUTROABS  --  6.7 3.1  --   --   HGB 11.6* 11.5* 11.5* 11.0* 11.3*  HCT 36.6* 36.5* 35.7* 34.2* 35.9*  MCV  --  87.3 85.4 85.3 85.7  PLT  --  206 217 225 272   Cardiac Enzymes: No results for input(s): CKTOTAL, CKMB, CKMBINDEX, TROPONINI in the last 168 hours. BNP: Invalid input(s): POCBNP CBG: Recent Labs  Lab 04/25/20 0710 04/25/20 1118 04/25/20 1629 04/25/20 1944 04/26/20 0803  GLUCAP 187* 262* 206* 162* 159*   D-Dimer No results for input(s): DDIMER in the last 72 hours. Hgb A1c No results for input(s): HGBA1C in the last 72 hours. Lipid Profile No results for input(s): CHOL, HDL, LDLCALC, TRIG, CHOLHDL, LDLDIRECT in the last 72 hours. Thyroid function studies No results for input(s): TSH, T4TOTAL, T3FREE, THYROIDAB in the last 72 hours.  Invalid input(s): FREET3 Anemia work up No results for input(s): VITAMINB12, FOLATE, FERRITIN, TIBC, IRON, RETICCTPCT in the last 72 hours. Urinalysis    Component Value Date/Time   COLORURINE YELLOW 04/24/2020 1054   APPEARANCEUR CLEAR 04/24/2020 1054   LABSPEC 1.009 04/24/2020 1054   PHURINE 6.0 04/24/2020 1054   GLUCOSEU >=500 (A) 04/24/2020 1054   HGBUR SMALL (A) 04/24/2020 1054   BILIRUBINUR NEGATIVE 04/24/2020 1054   KETONESUR NEGATIVE 04/24/2020 1054   PROTEINUR NEGATIVE 04/24/2020 1054   NITRITE NEGATIVE 04/24/2020 1054   LEUKOCYTESUR NEGATIVE 04/24/2020 1054   Sepsis Labs Invalid input(s): PROCALCITONIN,  WBC,  LACTICIDVEN Microbiology Recent Results (from the past 240 hour(s))  SARS CORONAVIRUS 2 (TAT 6-24 HRS) Nasopharyngeal Nasopharyngeal Swab     Status: None   Collection Time: 04/17/20 10:52 AM   Specimen: Nasopharyngeal Swab  Result Value Ref Range Status   SARS Coronavirus 2 NEGATIVE NEGATIVE Final    Comment: (NOTE) SARS-CoV-2 target nucleic acids are NOT DETECTED.  The SARS-CoV-2 RNA is generally detectable in upper and lower respiratory specimens during the acute phase of infection.  Negative results do not preclude SARS-CoV-2 infection, do not rule out co-infections with other pathogens, and should not be used as the sole basis for treatment or other patient management decisions. Negative results must be combined with clinical observations, patient history, and epidemiological information. The expected result is Negative.  Fact Sheet for Patients: SugarRoll.be  Fact Sheet for Healthcare Providers: https://www.woods-mathews.com/  This test is not yet approved or cleared by the [place] FDA and  has been authorized for detection and/or diagnosis of SARS-CoV-2 by FDA under an Emergency Use Authorization (EUA). This EUA will remain  in effect (meaning this test can be used) for the duration of the COVID-19 declaration under Se ction 564(b)(1) of the Act, 21 U.S.C. section 360bbb-3(b)(1), unless the authorization is terminated or revoked sooner.  Performed at [facility], [place] [address] [place], [place] [address]   Resp Panel by RT-PCR (Flu A&B, Covid) Nasopharyngeal Swab     Status: None   Collection Time: 04/23/20  5:17 AM   Specimen: Nasopharyngeal Swab; Nasopharyngeal(NP) swabs in vial transport medium  Result Value Ref Range Status   SARS Coronavirus 2 by RT PCR NEGATIVE NEGATIVE Final    Comment: (NOTE)  SARS-CoV-2 target nucleic acids are NOT DETECTED.  The SARS-CoV-2 RNA is generally detectable in upper respiratory specimens during the acute phase of infection. The lowest concentration of SARS-CoV-2 viral copies this assay can detect is 138 copies/mL. A negative result does not preclude SARS-Cov-2 infection and should not be used as the sole basis for treatment or other patient management decisions. A negative result may occur with  improper specimen collection/handling, submission of specimen other than nasopharyngeal swab, presence of viral mutation(s) within the areas targeted by this assay, and  inadequate number of viral copies(<138 copies/mL). A negative result must be combined with clinical observations, patient history, and epidemiological information. The expected result is Negative.  Fact Sheet for Patients:  EntrepreneurPulse.com.au  Fact Sheet for Healthcare Providers:  IncredibleEmployment.be  This test is no t yet approved or cleared by the [place] FDA and  has been authorized for detection and/or diagnosis of SARS-CoV-2 by FDA under an Emergency Use Authorization (EUA). This EUA will remain  in effect (meaning this test can be used) for the duration of the COVID-19 declaration under Section 564(b)(1) of the Act, 21 U.S.C.section 360bbb-3(b)(1), unless the authorization is terminated  or revoked sooner.       Influenza A by PCR NEGATIVE NEGATIVE Final   Influenza B by PCR NEGATIVE NEGATIVE Final    Comment: (NOTE) The Xpert Xpress SARS-CoV-2/FLU/RSV plus assay is intended as an aid in the diagnosis of influenza from Nasopharyngeal swab specimens and should not be used as a sole basis for treatment. Nasal washings and aspirates are unacceptable for Xpert Xpress SARS-CoV-2/FLU/RSV testing.  Fact Sheet for Patients: EntrepreneurPulse.com.au  Fact Sheet for Healthcare Providers: IncredibleEmployment.be  This test is not yet approved or cleared by the [place] FDA and has been authorized for detection and/or diagnosis of SARS-CoV-2 by FDA under an Emergency Use Authorization (EUA). This EUA will remain in effect (meaning this test can be used) for the duration of the COVID-19 declaration under Section 564(b)(1) of the Act, 21 U.S.C. section 360bbb-3(b)(1), unless the authorization is terminated or revoked.  Performed at [facility], [place] [address] [place], [place] [address]   Culture, blood (Routine X 2) w Reflex to ID Panel     Status: None (Preliminary  result)   Collection Time: 04/24/20  8:32 AM   Specimen: BLOOD RIGHT HAND  Result Value Ref Range Status   Specimen Description   Final    BLOOD RIGHT HAND Performed at [facility] [address] [place], [place] [address]    Special Requests   Final    BOTTLES DRAWN AEROBIC AND ANAEROBIC Blood Culture adequate volume Performed at [facility] [address], [place], [place] [address]    Culture   Final    NO GROWTH 1 DAY Performed at [facility], [place] [address], [place], [place] [address]    Report Status PENDING  Incomplete  Culture, blood (Routine X 2) w Reflex to ID Panel     Status: None (Preliminary result)   Collection Time: 04/24/20  8:37 AM   Specimen: BLOOD LEFT HAND  Result Value Ref Range Status   Specimen Description   Final    BLOOD LEFT HAND Performed at [facility] [address] [place], [place] [address]    Special Requests   Final    BOTTLES DRAWN AEROBIC AND ANAEROBIC Blood Culture adequate volume Performed at [facility] [address] [place], [place]  [address]    Culture   Final    NO GROWTH 1 DAY Performed at [facility], [place] [address] [place], [place] [address]    Report Status PENDING  Incomplete  Culture, Urine     Status: Abnormal   Collection Time: 04/24/20 10:54 AM   Specimen: Urine, Clean Catch  Result Value Ref Range Status   Specimen Description   Final    URINE, CLEAN CATCH Performed at [facility], [place] [address] [place], [place] [address]    Special Requests   Final    NONE Performed at [facility], [place] [address] [place], [place] [address]    Culture 30,000 COLONIES/mL STAPHYLOCOCCUS EPIDERMIDIS (A)  Final   Report Status 04/26/2020 FINAL  Final   Organism ID, Bacteria STAPHYLOCOCCUS EPIDERMIDIS (A)  Final      Susceptibility   Staphylococcus epidermidis - MIC*    CIPROFLOXACIN >=8  RESISTANT Resistant     GENTAMICIN <=0.5 SENSITIVE Sensitive     NITROFURANTOIN <=16 SENSITIVE Sensitive     OXACILLIN >=4 RESISTANT Resistant     TETRACYCLINE 2 SENSITIVE Sensitive     VANCOMYCIN <=0.5 SENSITIVE Sensitive     TRIMETH/SULFA 80 RESISTANT Resistant     CLINDAMYCIN >=8 RESISTANT Resistant     RIFAMPIN <=0.5 SENSITIVE Sensitive     Inducible Clindamycin NEGATIVE Sensitive     * 30,000 COLONIES/mL STAPHYLOCOCCUS EPIDERMIDIS  SARS CORONAVIRUS 2 (TAT 6-24 HRS) Nasopharyngeal Nasopharyngeal Swab     Status: None   Collection Time: 04/25/20  3:41 PM   Specimen: Nasopharyngeal Swab  Result Value Ref Range Status   SARS Coronavirus 2 NEGATIVE NEGATIVE Final    Comment: (NOTE) SARS-CoV-2 target nucleic acids are NOT DETECTED.  The SARS-CoV-2 RNA is generally detectable in upper and lower respiratory specimens during the acute phase of infection. Negative results do not preclude SARS-CoV-2 infection, do not rule out co-infections with other pathogens, and should not be used as the sole basis for treatment or other patient management decisions. Negative results must be combined with clinical observations, patient history, and epidemiological information. The expected result is Negative.  Fact Sheet for Patients: SugarRoll.be  Fact Sheet for Healthcare Providers: https://www.woods-mathews.com/  This test is not yet approved or cleared by the [place] FDA and  has been authorized for detection and/or diagnosis of SARS-CoV-2 by FDA under an Emergency Use Authorization (EUA). This EUA will remain  in effect (meaning this test can be used) for the duration of the COVID-19 declaration under Se ction 564(b)(1) of the Act, 21 U.S.C. section 360bbb-3(b)(1), unless the authorization is terminated or revoked sooner.  Performed at [facility], [place] [address], [place], [place] [address]      Time coordinating discharge: Over 30  minutes  SIGNED:   Little Ishikawa, DO [facility] 04/26/2020, 9:18 AM Pager   If 7PM-7AM, please contact night-coverage [email or web address]

## 2020-04-26 NOTE — Progress Notes (Signed)
Report called to Manus Rudd RN at [facility]. All questions answered. Pt belongings at bedside and oxygen tank to go with patient. IV access removed by Jim Like. Dressing applied. Prescription and AVS summary placed in discharge packet to go with PTAR to take to [facility].  Awaiting PTAR.

## 2020-04-29 LAB — CULTURE, BLOOD (ROUTINE X 2)
Culture: NO GROWTH
Culture: NO GROWTH
Special Requests: ADEQUATE
Special Requests: ADEQUATE

## 2020-05-23 DIAGNOSIS — Z79891 Long term (current) use of opiate analgesic: Secondary | ICD-10-CM | POA: Insufficient documentation

## 2020-05-23 DIAGNOSIS — F1120 Opioid dependence, uncomplicated: Secondary | ICD-10-CM | POA: Insufficient documentation

## 2020-05-23 NOTE — Progress Notes (Signed)
PROVIDER NOTE: Information contained herein reflects review and annotations entered in association with encounter. Interpretation of such information and data should be left to medically-trained personnel. Information provided to patient can be located elsewhere in the medical record under "Patient Instructions". Document created using STT-dictation technology, any transcriptional errors that may result from process are unintentional.    Patient: Kenneth Osborn  Service Category: E/M  Provider: Gaspar Cola, MD  DOB: Aug 01, 1955  DOS: 05/24/2020  Specialty: Interventional Pain Management  MRN: [id number]  Setting: Ambulatory outpatient  PCP: [name], MD  Type: Established Patient    Referring Provider: [name], MD  Location: Office  Delivery: Face-to-face     HPI  [name], a 65 y.o. year old male, is here today because of his Chronic pain syndrome [G89.4]. Mr. Criado primary complain today is Back Pain Last encounter: My last encounter with him was on 02/24/2020. Pertinent problems: Mr. 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 Beverely Pace at [facility] on 01/04/2013); Neurogenic pain; Thoracic facet syndrome (T8-10); Lumbar facet syndrome (Bilateral) (R>L); Cervical facet syndrome (Right); Cervical spondylosis; Lumbar spondylosis; Chronic upper extremity pain (Left); Chronic cervical radicular pain (Left); Chronic upper back pain; History of thoracic spine surgery (S/P T9-10 IVD spacer); Failed back surgical syndrome; Chronic musculoskeletal pain; Myofascial pain; Chronic lower extremity pain (Left); Chronic radicular lumbar pain (left L4 dermatomal pain); Arthralgia of shoulder; Chronic tension-type headache, not intractable; 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); Chronic pain syndrome;  Headache disorder; Trigger point posterior superior iliac spine (PSIS) (Right); 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 Pain Assessment: Severity of Chronic pain is reported as a 5 /10. Location: Back (back and stomach) Mid,Lower/Denies. Onset: More than a month ago. Quality: Aching,Burning,Sore,Constant,Pressure. Timing: Constant. Modifying factor(s): meds and rest. Vitals:  height is 5' 9"  (1.753 m) and weight is 200 lb (90.7 kg). His temperature is 97.1 F (36.2 C) (abnormal). His blood pressure is 140/79 and his pulse is 77. His oxygen saturation is 98%.   Reason for encounter: medication management.   The patient indicates doing well with the current medication regimen. No adverse reactions or side effects reported to the medications.  However, the patient is clearly oversedated.  He is falling asleep as I am talking to him.  I questioned him about this and he indicated that he was not able to sleep well last night.  However, I have the feeling that this is not entirely correct.  The PMP has revealed that he is taking benzodiazepines, which he is likely to be compliant with the narcotics.  The patient has been warned about this.  According to electronic medical record, on 04/20/2020 the patient underwent a robotic assisted laparoscopic left radical nephrectomy and adrenalectomy by Dr. Raynelle Bring, for the treatment of a left renal neoplasm.  Apparently on the same date, he also had a robotic cholecystectomy, lysis of  additions, and a liver wedge biopsy done by Dr. Michael Boston, for the treatment of an acute  on chronic calculus cholecystitis.  Today is postoperative day 34.   According to the patient's PMP the last time that he had his oxycodone failed was on 04/03/2020, at which time he filled the first of 3 prescriptions that I wrote on 02/24/2020.  This means that the patient should still have 2 additional prescriptions for oxycodone IR 10 mg # 120, available in the pharmacy.  This is a 60-day supply of the medication.  If he was to have these prescriptions filled today, he should have enough medication to last until 07/23/2020.  Today I question patient about this and he indicated that he went to [facility] and he was told that he had no other prescriptions left.  However, when I called the pharmacy to confirm, they did confirm that they still have the 2 prescriptions that I had sent them from 02/24/2020.  Because these prescriptions were for the oxycodone IR 10 mg, I have asked him to cancel them and I will be sending them new prescriptions.  Because the patient had a nephrectomy, it is very likely that we will have to adjust all of his medications to compensate for his decreased renal function.  He indicated that in the postoperative.  They had changed his medications to oxycodone IR 5 mg every 6 hours.  He also indicated that while he was taking the medication in this manner, he was certainly able to stay awake better and he was taking care of his pain.  In view of this, I have decided to switch him from the oxycodone IR 10 mg 4 times daily to oxycodone IR 5 mg 6 times a day.  This represents a decrease in his dose from 40 mg of oxycodone per day to 30 mg/day.  Today we have collected all of his oxycodone IR 10 mg pills and we have disposed of them in front of witnesses.  He should now have no oxycodone 10 mg pills at home and he should also have no prescriptions left for this particular strength.  Today the patient also  asked about the Flexeril and the Neurontin, which I reminded him I had transferred on 11/22/2019.  He wanted to know why the Flexeril have been stopped and the Neurontin decreased and I had to explain to him about the issues of medications and how they can be affected by changes in liver and kidney function.  I explained to him that after having had his kidney removed, his renal function and capacity has reduced and therefore they had to compensate for that.  However, I reminded him that this is the reason why I transferred the nonopioids so that this type of issues can be managed by his primary care physician, allowing me to concentrate on the problems with the opioids.  He understood and accepted.  RTCB: 07/23/2020 Nonopioids transferred 11/22/2019: Magnesium, Flexeril, and Neurontin.  Pharmacotherapy Assessment   Analgesic: Oxycodone IR 5 mg, 1 tab p.o. every 4 hours (30 mg/day of oxycodone) MME/day:45 mg/day.   Monitoring: [place] PMP: PDMP reviewed during this encounter.       Pharmacotherapy: No side-effects or adverse reactions reported. Compliance: No problems identified. Effectiveness: Clinically acceptable.  Ignatius Specking, RN  05/24/2020  9:44 AM  Sign when Signing Visit Wasted Oxy 66m 77 tabs in commode. Witnessed by patient, cgarner,rn, and Ktice,rn. ( Ok per VAngelique Holm  BChauncey Fischer RN  05/24/2020  8:52 AM  Sign when Signing Visit Nursing Pain Medication Assessment:  Safety precautions to be maintained throughout the  outpatient stay will include: orient to surroundings, keep bed in low position, maintain call bell within reach at all times, provide assistance with transfer out of bed and ambulation.  Medication Inspection Compliance: Pill count conducted under aseptic conditions, in front of the patient. Neither the pills nor the bottle was removed from the patient's sight at any time. Once count was completed pills were immediately returned to the patient in their original  bottle.  Medication #1: Oxycodone IR 90m Pill/Patch Count: 10 of 60 pills remain Pill/Patch Appearance: Markings consistent with prescribed medication Bottle Appearance: Standard pharmacy container. Clearly labeled. Filled Date: 3 / 10 / 22 Last Medication intake:  Yesterday  Medication #2: Oxycodone IR Pill/Patch Count: 86 of 102 pills remain Pill/Patch Appearance: Markings consistent with prescribed medication Bottle Appearance: Standard pharmacy container. Clearly labeled. Filled Date: 3 / 9 / 22 Last Medication intake:  TodaySafety precautions to be maintained throughout the outpatient stay will include: orient to surroundings, keep bed in low position, maintain call bell within reach at all times, provide assistance with transfer out of bed and ambulation.     UDS:  Summary  Date Value Ref Range Status  03/18/2018 FINAL  Final    Comment:    ==================================================================== TOXASSURE SELECT 13 (MW) ==================================================================== Test                             Result       Flag       Units Drug Present and Declared for Prescription Verification   Desmethyldiazepam              123          EXPECTED   ng/mg creat   Oxazepam                       >897         EXPECTED   ng/mg creat   Temazepam                      291          EXPECTED   ng/mg creat    Desmethyldiazepam, oxazepam, and temazepam are expected    metabolites of diazepam. Desmethyldiazepam and oxazepam are also    expected metabolites of other drugs, including chlordiazepoxide,    prazepam, clorazepate, and halazepam. Oxazepam is an expected    metabolite of temazepam. Oxazepam and temazepam are also    available as scheduled prescription medications.   Oxycodone                      1956         EXPECTED   ng/mg creat   Oxymorphone                    4052         EXPECTED   ng/mg creat   Noroxycodone                   957          EXPECTED    ng/mg creat   Noroxymorphone                 485          EXPECTED   ng/mg creat    Sources of oxycodone are scheduled prescription medications.    Oxymorphone, noroxycodone, and noroxymorphone  are expected    metabolites of oxycodone. Oxymorphone is also available as a    scheduled prescription medication. Drug Absent but Declared for Prescription Verification   Alprazolam                     Not Detected UNEXPECTED ng/mg creat ==================================================================== Test                      Result    Flag   Units      Ref Range   Creatinine              223              mg/dL      >=20 ==================================================================== Declared Medications:  The flagging and interpretation on this report are based on the  following declared medications.  Unexpected results may arise from  inaccuracies in the declared medications.  **Note: The testing scope of this panel includes these medications:  Alprazolam (Xanax)  Diazepam (Valium)  Oxycodone  **Note: The testing scope of this panel does not include following  reported medications:  Aspirin  Cetirizine (Zyrtec)  Cyclobenzaprine (Flexeril)  Diltiazem (Cardizem)  Ezetimibe (Zetia)  Gabapentin (Neurontin)  Insulin (Humulin)  Isosorbide (Imdur)  Magnesium  Meclizine (Antivert)  Metformin (Glucophage)  Metoprolol (Toprol)  Montelukast (Singulair)  Naloxone (Narcan)  Niacin (Niaspan)  Nitroglycerin (Nitrostat)  Omega-3 Fatty Acids (Fish Oil)  Omeprazole (Nexium)  Prasugrel (Effient)  Rosuvastatin (Crestor) ==================================================================== For clinical consultation, please call [phone]. ====================================================================      ROS  Constitutional: Denies any fever or chills Gastrointestinal: No reported hemesis, hematochezia, vomiting, or acute GI distress Musculoskeletal: Denies any acute  onset joint swelling, redness, loss of ROM, or weakness Neurological: No reported episodes of acute onset apraxia, aphasia, dysarthria, agnosia, amnesia, paralysis, loss of coordination, or loss of consciousness  Medication Review  ALPRAZolam, Glucagon, Magnesium Oxide, aspirin EC, cetirizine, cyclobenzaprine, diazepam, diltiazem, esomeprazole, ezetimibe, gabapentin, hydrocortisone, insulin regular human CONCENTRATED, meclizine, metoprolol succinate, montelukast, niacin, oxyCODONE, polyethylene glycol, prasugrel, rosuvastatin, senna-docusate, and sodium chloride  History Review  Allergy: Mr. Fofana has No Known Allergies. Drug: Mr. Heid  reports no history of drug use. Alcohol:  reports no history of alcohol use. Tobacco:  reports that he has never smoked. He has never used smokeless tobacco. Social: Mr. Feggins  reports that he has never smoked. He has never used smokeless tobacco. He reports that he does not drink alcohol and does not use drugs. Medical:  has a past medical history of Acute postoperative pain (12/03/2018), Allergic rhinitis (12/30/2012), Anginal pain ([place]), Anxiety, Bronchitis, Can't get food down (08/12/2014), Cancer ([place]) (01/2020), Chronic back pain, Concussion (09/2015), Coronary artery disease, DDD (degenerative disc disease), cervical, Dehydration symptoms, Diabetes mellitus without complication ([place]), Dysphagia, GERD (gastroesophageal reflux disease), History of Meniere's disease (12/21/2014), History of thoracic spine surgery (S/P T9-10 IVD spacer) (12/21/2014), Hypercholesteremia, Hyperlipidemia, Hypertension, Meniere's disease, Myocardial infarction ([place]) (2012), Neuromuscular disorder ([place]), Severe sepsis ([place]) (02/07/2020), Short-segment Barrett's esophagus, and Sleep apnea. Surgical: Mr. Siemon  has a past surgical history that includes Cardiovascular stress test; Cardiac catheterization; Labrinthectomy; mastoid shunt (Bilateral); Back surgery; Shoulder arthroscopy with  subacromial decompression (Left, 04/06/2012); ARTHRODESIS ANTERIOR ANTERIOR CERVICLE SPINE (01/04/2013); Colonoscopy with propofol (N/A, 09/19/2014); Esophagogastroduodenoscopy (N/A, 09/19/2014); Savory dilation (N/A, 09/19/2014); Spinal cord stimulator implant (Right); Cardiac catheterization (N/A, 06/15/2015); Cardiac catheterization (N/A, 06/15/2015); Pulse generator implant (N/A, 01/18/2019); Lumbar spinal cord simulator lead removal (Right, 08/09/2019); IR Perc Cholecystostomy (02/09/2020); IR Radiologist  Eval & Mgmt (03/14/2020); Coronary angioplasty; Appendectomy; and Robot assisted laparoscopic nephrectomy (Left, 04/20/2020). Family: family history includes Cancer in his sister; Diabetes in his maternal grandmother, mother, and paternal grandmother; Heart disease in his father, maternal aunt, maternal uncle, and mother.  Laboratory Chemistry Profile   Renal Lab Results  Component Value Date   BUN 19 04/26/2020   CREATININE 1.64 (H) 04/26/2020   GFRAA 58 (L) 01/08/2019   GFRNONAA 46 (L) 04/26/2020     Hepatic Lab Results  Component Value Date   AST 25 04/23/2020   ALT 24 04/23/2020   ALBUMIN 3.3 (L) 04/23/2020   ALKPHOS 67 04/23/2020   HCVAB NON REACTIVE 02/08/2020   LIPASE 22 04/23/2020   AMMONIA 24 02/07/2020     Electrolytes Lab Results  Component Value Date   NA 140 04/26/2020   K 4.2 04/26/2020   CL 102 04/26/2020   CALCIUM 9.7 04/26/2020   MG 1.7 02/16/2020   PHOS 2.6 02/14/2020     Bone No results found for: VD25OH, VD125OH2TOT, [id number], JE5631SH7, 25OHVITD1, 25OHVITD2, 25OHVITD3, TESTOFREE, TESTOSTERONE   Inflammation (CRP: Acute Phase) (ESR: Chronic Phase) Lab Results  Component Value Date   CRP 1.4 (H) 03/22/2015   ESRSEDRATE 9 03/22/2015   LATICACIDVEN 0.8 04/23/2020       Note: Above Lab results reviewed.  Recent Imaging Review  NM Pulmonary Perfusion CLINICAL DATA:  65 year old male with shortness of breath.  EXAM: NUCLEAR MEDICINE PERFUSION LUNG  SCAN  TECHNIQUE: Perfusion images were obtained in multiple projections after intravenous injection of radiopharmaceutical.  Ventilation scans intentionally deferred if perfusion scan and chest x-ray adequate for interpretation during COVID 19 epidemic.  RADIOPHARMACEUTICALS:  4.4 mCi Tc-7mMAA IV  COMPARISON:  04/23/2020 chest radiograph  FINDINGS: No which shaped perfusion defects are noted to suggest pulmonary emboli.  IMPRESSION: Pulmonary embolism absent  Electronically Signed   By: JMargarette CanadaM.D.   On: 04/23/2020 14:48 CT ABDOMEN PELVIS WO CONTRAST CLINICAL DATA:  Status post nephrectomy and cholecystectomy. Abdominal distension, rebound tenderness  EXAM: CT ABDOMEN AND PELVIS WITHOUT CONTRAST  TECHNIQUE: Multidetector CT imaging of the abdomen and pelvis was performed following the standard protocol without IV contrast.  COMPARISON:  03/07/2020  FINDINGS: Lower chest: Small right pleural effusion is present with compressive atelectasis of the right lower lobe. Mild left basilar atelectasis. Coronary artery stenting has been performed. Cardiac size within normal limits. Trace pericardial effusion is likely physiologic.  Hepatobiliary: Status post cholecystectomy and removal of the cholecystostomy catheter. Poorly circumscribed fluid within the gallbladder fossa. No intra or extrahepatic biliary ductal dilation. Liver unremarkable.  Pancreas: Unremarkable  Spleen: Unremarkable  Adrenals/Urinary Tract: Interval left nephrectomy with small loculated fluid collection within the nephrectomy bed measuring 3.6 x 6.3 cm. This abuts and obscures the left adrenal gland. Right adrenal gland and kidney are unremarkable. Bladder is unremarkable.  Stomach/Bowel: Stomach, small bowel, and large bowel are unremarkable. Appendix absent. Trace perihepatic ascites. Punctate foci of free intraperitoneal gas within the abdomen noted anteriorly, likely postsurgical in  nature. Subcutaneous gas within the anterior abdominal wall within the suprapubic region is also likely postsurgical in nature.  Vascular/Lymphatic: Mild aortoiliac atherosclerotic calcification. No aortic aneurysm. No pathologic adenopathy within the abdomen and pelvis.  Reproductive: Prostate is unremarkable.  Other: Tiny fat containing umbilical hernia. Infiltration within the anterior abdominal wall may be postsurgical in nature and appears unchanged from prior examination. Rectum unremarkable.  Musculoskeletal: No acute bone abnormality.  IMPRESSION: Interval left nephrectomy with 6.3 cm loculated  fluid collection within the nephrectomy bed.  Interval cholecystectomy with poorly circumscribed fluid within the gallbladder fossa and trace perihepatic ascites.  Punctate foci of gas within the peritoneum and subcutaneous gas within the suprapubic region within expectation given recent laparoscopic surgery.  Small right pleural effusion with bibasilar atelectasis.  Aortic Atherosclerosis (ICD10-I70.0).  Electronically Signed   By: Fidela Salisbury MD   On: 04/23/2020 02:30 DG Chest Port 1 View CLINICAL DATA:  Weakness, hypoxia  EXAM: PORTABLE CHEST 1 VIEW  COMPARISON:  02/13/2020  FINDINGS: Lung volumes are small and there is mild elevation of the right hemidiaphragm with asymmetric right basilar atelectasis. No superimposed focal pulmonary infiltrate. No pneumothorax or pleural effusion. Cardiac size within normal limits. Pulmonary vascularity normal. No acute bone abnormality.  IMPRESSION: Pulmonary hypoinflation  Electronically Signed   By: Fidela Salisbury MD   On: 04/23/2020 02:19 Note: Reviewed        Physical Exam  General appearance: Well nourished, well developed, and well hydrated. In no apparent acute distress Mental status: Alert, oriented x 3 (person, place, & time)       Respiratory: No evidence of acute respiratory distress Eyes: PERLA Vitals:  BP 140/79   Pulse 77   Temp (!) 97.1 F (36.2 C)   Ht 5' 9"  (1.753 m)   Wt 200 lb (90.7 kg)   SpO2 98%   BMI 29.53 kg/m  BMI: Estimated body mass index is 29.53 kg/m as calculated from the following:   Height as of this encounter: 5' 9"  (1.753 m).   Weight as of this encounter: 200 lb (90.7 kg). Ideal: Ideal body weight: 70.7 kg (155 lb 13.8 oz) Adjusted ideal body weight: 78.7 kg (173 lb 8.3 oz)  Assessment   Status Diagnosis  Controlled Controlled Controlled 1. Chronic pain syndrome   2. Failed back surgical syndrome   3. Chronic low back pain (Bilateral) (L>R) w/ sciatica (Left)   4. Chronic lower extremity pain (Left)   5. 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 Pharmacologic therapy   7. Chronic use of opiate for therapeutic purpose      Updated Problems: No problems updated.  Plan of Care  Problem-specific:  No problem-specific Assessment & Plan notes found for this encounter.  Mr. KOHNER ORLICK has a current medication list which includes the following long-term medication(s): cetirizine, cyclobenzaprine, esomeprazole, ezetimibe, gabapentin, humulin r u-500 kwikpen, magnesium oxide, metoprolol succinate, montelukast, niacin, oxycodone, and [START ON 06/23/2020] oxycodone.  Pharmacotherapy (Medications Ordered): Meds ordered this encounter  Medications  . oxyCODONE (OXY IR/ROXICODONE) 5 MG immediate release tablet    Sig: Take 1 tablet (5 mg total) by mouth every 4 (four) hours as needed for severe pain. Must last 30 days.    Dispense:  180 tablet    Refill:  0    Not a duplicate. Do NOT delete! Dispense 1 day early if closed on refill date. Avoid benzodiazepines within 8 hours of opioids. Do not send refill requests.  Marland Kitchen oxyCODONE (OXY IR/ROXICODONE) 5 MG immediate release tablet    Sig: Take 1 tablet (5 mg total) by mouth every 4 (four) hours as needed for severe pain. Must last 30 days.    Dispense:  180 tablet    Refill:  0    Not a duplicate. Do  NOT delete! Dispense 1 day early if closed on refill date. Avoid benzodiazepines within 8 hours of opioids. Do not send refill requests.   Orders:  No orders of the  defined types were placed in this encounter.  Follow-up plan:   Return in about 2 months (around 07/23/2020) for (F2F), (MM).      Considering: NOTE: EFFIENT Anticoagulation (Stop: 7-10 days  Re-start: 6 hrs)    Palliative PRN treatment(s): Palliative right PSIS MNB/TPI  Diagnostic/palliative right lumbar facet block #3  Diagnostic/palliative left lumbar facet block #2  Palliative left lumbar facet RFA #2 (last done 12/03/2018)  Palliative right lumbar facet RFA #3 (last done 12/02/2019)       Recent Visits Date Type Provider Dept  02/24/20 Office Visit Milinda Pointer, MD [facility]  Showing recent visits within past 90 days and meeting all other requirements Today's Visits Date Type Provider Dept  05/24/20 Office Visit Milinda Pointer, MD [facility]  Showing today's visits and meeting all other requirements Future Appointments Date Type Provider Dept  06/19/20 Appointment Milinda Pointer, MD [facility]  07/19/20 Appointment Milinda Pointer, MD [facility]  Showing future appointments within next 90 days and meeting all other requirements  I discussed the assessment and treatment plan with the patient. The patient was provided an opportunity to ask questions and all were answered. The patient agreed with the plan and demonstrated an understanding of the instructions.  Patient advised to call back or seek an in-person evaluation if the symptoms or condition worsens.  Duration of encounter: 30 minutes.  Note by: Gaspar Cola, MD Date: 05/24/2020; Time: 10:28 AM

## 2020-05-24 ENCOUNTER — Encounter: Payer: Self-pay | Admitting: Pain Medicine

## 2020-05-24 ENCOUNTER — Ambulatory Visit: Payer: Medicare Other | Attending: Pain Medicine | Admitting: Pain Medicine

## 2020-05-24 ENCOUNTER — Other Ambulatory Visit: Payer: Self-pay

## 2020-05-24 VITALS — BP 140/79 | HR 77 | Temp 97.1°F | Ht 69.0 in | Wt 200.0 lb

## 2020-05-24 DIAGNOSIS — G8929 Other chronic pain: Secondary | ICD-10-CM | POA: Insufficient documentation

## 2020-05-24 DIAGNOSIS — Z79899 Other long term (current) drug therapy: Secondary | ICD-10-CM | POA: Insufficient documentation

## 2020-05-24 DIAGNOSIS — M79605 Pain in left leg: Secondary | ICD-10-CM | POA: Insufficient documentation

## 2020-05-24 DIAGNOSIS — M961 Postlaminectomy syndrome, not elsewhere classified: Secondary | ICD-10-CM | POA: Insufficient documentation

## 2020-05-24 DIAGNOSIS — Z79891 Long term (current) use of opiate analgesic: Secondary | ICD-10-CM | POA: Diagnosis present

## 2020-05-24 DIAGNOSIS — G894 Chronic pain syndrome: Secondary | ICD-10-CM | POA: Diagnosis present

## 2020-05-24 DIAGNOSIS — F1120 Opioid dependence, uncomplicated: Secondary | ICD-10-CM

## 2020-05-24 DIAGNOSIS — M5442 Lumbago with sciatica, left side: Secondary | ICD-10-CM | POA: Insufficient documentation

## 2020-05-24 DIAGNOSIS — M960 Pseudarthrosis after fusion or arthrodesis: Secondary | ICD-10-CM | POA: Diagnosis present

## 2020-05-24 MED ORDER — OXYCODONE HCL 5 MG PO TABS: 5.0000 mg | ORAL_TABLET | ORAL | 0 refills | Status: DC | PRN

## 2020-05-24 MED ORDER — OXYCODONE HCL 5 MG PO TABS
5.0000 mg | ORAL_TABLET | ORAL | 0 refills | Status: DC | PRN
Start: 2020-06-23 — End: 2020-09-19

## 2020-05-24 NOTE — Progress Notes (Signed)
Nursing Pain Medication Assessment:  Safety precautions to be maintained throughout the outpatient stay will include: orient to surroundings, keep bed in low position, maintain call bell within reach at all times, provide assistance with transfer out of bed and ambulation.  Medication Inspection Compliance: Pill count conducted under aseptic conditions, in front of the patient. Neither the pills nor the bottle was removed from the patient's sight at any time. Once count was completed pills were immediately returned to the patient in their original bottle.  Medication #1: Oxycodone IR 79m Pill/Patch Count: 10 of 60 pills remain Pill/Patch Appearance: Markings consistent with prescribed medication Bottle Appearance: Standard pharmacy container. Clearly labeled. Filled Date: 3 / 10 / 22 Last Medication intake:  Yesterday  Medication #2: Oxycodone IR Pill/Patch Count: 86 of 102 pills remain Pill/Patch Appearance: Markings consistent with prescribed medication Bottle Appearance: Standard pharmacy container. Clearly labeled. Filled Date: 3 / 9 / 22 Last Medication intake:  TodaySafety precautions to be maintained throughout the outpatient stay will include: orient to surroundings, keep bed in low position, maintain call bell within reach at all times, provide assistance with transfer out of bed and ambulation.

## 2020-05-24 NOTE — Progress Notes (Signed)
Wasted Oxy 67m 77 tabs in commode. Witnessed by patient, cgarner,rn, and Ktice,rn. ( Ok per [employer]

## 2020-05-24 NOTE — Patient Instructions (Signed)
____________________________________________________________________________________________  Medication Recommendations and Reminders  Applies to: All patients receiving prescriptions (written and/or electronic).  Medication Rules & Regulations: These rules and regulations exist for your safety and that of others. They are not flexible and neither are we. Dismissing or ignoring them will be considered "non-compliance" with medication therapy, resulting in complete and irreversible termination of such therapy. (See document titled "Medication Rules" for more details.) In all conscience, because of safety reasons, we cannot continue providing a therapy where the patient does not follow instructions.  Pharmacy of record:   Definition: This is the pharmacy where your electronic prescriptions will be sent.   We do not endorse any particular pharmacy, however, we have experienced problems with [facility] not securing enough medication supply for the community.  We do not restrict you in your choice of pharmacy. However, once we write for your prescriptions, we will NOT be re-sending more prescriptions to fix restricted supply problems created by your pharmacy, or your insurance.   The pharmacy listed in the electronic medical record should be the one where you want electronic prescriptions to be sent.  If you choose to change pharmacy, simply notify our nursing staff.  Recommendations:  Keep all of your pain medications in a safe place, under lock and key, even if you live alone. We will NOT replace lost, stolen, or damaged medication.  After you fill your prescription, take 1 week's worth of pills and put them away in a safe place. You should keep a separate, properly labeled bottle for this purpose. The remainder should be kept in the original bottle. Use this as your primary supply, until it runs out. Once it's gone, then you know that you have 1 week's worth of medicine, and it is time to come  in for a prescription refill. If you do this correctly, it is unlikely that you will ever run out of medicine.  To make sure that the above recommendation works, it is very important that you make sure your medication refill appointments are scheduled at least 1 week before you run out of medicine. To do this in an effective manner, make sure that you do not leave the office without scheduling your next medication management appointment. Always ask the nursing staff to show you in your prescription , when your medication will be running out. Then arrange for the receptionist to get you a return appointment, at least 7 days before you run out of medicine. Do not wait until you have 1 or 2 pills left, to come in. This is very poor planning and does not take into consideration that we may need to cancel appointments due to bad weather, sickness, or emergencies affecting our staff.  DO NOT ACCEPT A "Partial Fill": If for any reason your pharmacy does not have enough pills/tablets to completely fill or refill your prescription, do not allow for a "partial fill". The law allows the pharmacy to complete that prescription within 72 hours, without requiring a new prescription. If they do not fill the rest of your prescription within those 72 hours, you will need a separate prescription to fill the remaining amount, which we will NOT provide. If the reason for the partial fill is your insurance, you will need to talk to the pharmacist about payment alternatives for the remaining tablets, but again, DO NOT ACCEPT A PARTIAL FILL, unless you can trust your pharmacist to obtain the remainder of the pills within 72 hours.  Prescription refills and/or changes in medication(s):  Prescription refills, and/or changes in dose or medication, will be conducted only during scheduled medication management appointments. (Applies to both, written and electronic prescriptions.)  No refills on procedure days. No medication will be  changed or started on procedure days. No changes, adjustments, and/or refills will be conducted on a procedure day. Doing so will interfere with the diagnostic portion of the procedure.  No phone refills. No medications will be "called into the pharmacy".  No Fax refills.  No weekend refills.  No Holliday refills.  No after hours refills.  Remember:  Business hours are:  Monday to Thursday 8:00 AM to 4:00 PM Provider's Schedule: Milinda Pointer, MD - Appointments are:  Medication management: Monday and Wednesday 8:00 AM to 4:00 PM Procedure day: Tuesday and Thursday 7:30 AM to 4:00 PM Gillis Santa, MD - Appointments are:  Medication management: Tuesday and Thursday 8:00 AM to 4:00 PM Procedure day: Monday and Wednesday 7:30 AM to 4:00 PM (Last update: 09/08/2019) ____________________________________________________________________________________________   ____________________________________________________________________________________________  CBD (cannabidiol) WARNING  Applicable to: All individuals currently taking or considering taking CBD (cannabidiol) and, more important, all patients taking opioid analgesic controlled substances (pain medication). (Example: oxycodone; oxymorphone; hydrocodone; hydromorphone; morphine; methadone; tramadol; tapentadol; fentanyl; buprenorphine; butorphanol; dextromethorphan; meperidine; codeine; etc.)  Legal status: CBD remains a Schedule I drug prohibited for any use. CBD is illegal with one exception. In the [place], CBD has a limited Transport planner (FDA) approval for the treatment of two specific types of epilepsy disorders. Only one CBD product has been approved by the FDA for this purpose: "Epidiolex". FDA is aware that some companies are marketing products containing cannabis and cannabis-derived compounds in ways that violate the [employer], Drug and Cosmetic Act [facility] Act) and that may put the health and safety  of consumers at risk. The FDA, a Federal agency, has not enforced the CBD status since 2018.   Legality: Some manufacturers ship CBD products nationally, which is illegal. Often such products are sold online and are therefore available throughout the country. CBD is openly sold in head shops and health food stores in some states where such sales have not been explicitly legalized. Selling unapproved products with unsubstantiated therapeutic claims is not only a violation of the law, but also can put patients at risk, as these products have not been proven to be safe or effective. Federal illegality makes it difficult to conduct research on CBD.  Reference: "FDA Regulation of Cannabis and Cannabis-Derived Products, Including Cannabidiol (CBD)" - [employer]  Warning: CBD is not FDA approved and has not undergo the same manufacturing controls as prescription drugs.  This means that the purity and safety of available CBD may be questionable. Most of the time, despite manufacturer's claims, it is contaminated with THC (delta-9-tetrahydrocannabinol - the chemical in marijuana responsible for the "HIGH").  When this is the case, the [facility] contaminant will trigger a positive urine drug screen (UDS) test for Marijuana (carboxy-THC). Because a positive UDS for any illicit substance is a violation of our medication agreement, your opioid analgesics (pain medicine) may be permanently discontinued.  MORE ABOUT CBD  General Information: CBD  is a derivative of the Marijuana (cannabis sativa) plant discovered in 25. It is one of the 113 identified substances found in Marijuana. It accounts for up to 40% of the plant's extract. As of 2018, preliminary clinical studies on CBD included research for the treatment of anxiety, movement disorders, and pain. CBD is available and consumed in multiple forms,  including  inhalation of smoke or vapor, as an aerosol spray, and by mouth. It may be supplied as an oil containing CBD, capsules, dried cannabis, or as a liquid solution. CBD is thought not to be as psychoactive as THC (delta-9-tetrahydrocannabinol - the chemical in marijuana responsible for the "HIGH"). Studies suggest that CBD may interact with different biological target receptors in the body, including cannabinoid and other neurotransmitter receptors. As of 2018 the mechanism of action for its biological effects has not been determined.  Side-effects  Adverse reactions: Dry mouth, diarrhea, decreased appetite, fatigue, drowsiness, malaise, weakness, sleep disturbances, and others.  Drug interactions: CBC may interact with other medications such as blood-thinners. (Last update: 09/25/2019) ____________________________________________________________________________________________   ____________________________________________________________________________________________  Medication Rules  Purpose: To inform patients, and their family members, of our rules and regulations.  Applies to: All patients receiving prescriptions (written or electronic).  Pharmacy of record: Pharmacy where electronic prescriptions will be sent. If written prescriptions are taken to a different pharmacy, please inform the nursing staff. The pharmacy listed in the electronic medical record should be the one where you would like electronic prescriptions to be sent.  Electronic prescriptions: In compliance with the [place] (STOP) Act of 2017 (Session Lanny Cramp [phone]), effective February 18, 2018, all controlled substances must be electronically prescribed. Calling prescriptions to the pharmacy will cease to exist.  Prescription refills: Only during scheduled appointments. Applies to all prescriptions.  NOTE: The following applies primarily to controlled substances (Opioid*  Pain Medications).   Type of encounter (visit): For patients receiving controlled substances, face-to-face visits are required. (Not an option or up to the patient.)  Patient's responsibilities: 1. Pain Pills: Bring all pain pills to every appointment (except for procedure appointments). 2. Pill Bottles: Bring pills in original pharmacy bottle. Always bring the newest bottle. Bring bottle, even if empty. 3. Medication refills: You are responsible for knowing and keeping track of what medications you take and those you need refilled. The day before your appointment: write a list of all prescriptions that need to be refilled. The day of the appointment: give the list to the admitting nurse. Prescriptions will be written only during appointments. No prescriptions will be written on procedure days. If you forget a medication: it will not be "Called in", "Faxed", or "electronically sent". You will need to get another appointment to get these prescribed. No early refills. Do not call asking to have your prescription filled early. 4. Prescription Accuracy: You are responsible for carefully inspecting your prescriptions before leaving our office. Have the discharge nurse carefully go over each prescription with you, before taking them home. Make sure that your name is accurately spelled, that your address is correct. Check the name and dose of your medication to make sure it is accurate. Check the number of pills, and the written instructions to make sure they are clear and accurate. Make sure that you are given enough medication to last until your next medication refill appointment. 5. Taking Medication: Take medication as prescribed. When it comes to controlled substances, taking less pills or less frequently than prescribed is permitted and encouraged. Never take more pills than instructed. Never take medication more frequently than prescribed.  6. Inform other Doctors: Always inform, all of your  healthcare providers, of all the medications you take. 7. Pain Medication from other Providers: You are not allowed to accept any additional pain medication from any other Doctor or Healthcare provider. There are two exceptions to this rule. (see below) In  the event that you require additional pain medication, you are responsible for notifying us, as stated below. 8. Cough Medicine: Often these contain an opioid, such as codeine or hydrocodone. Never accept or take cough medicine containing these opioids if you are already taking an opioid* medication. The combination may cause respiratory failure and death. 9. Medication Agreement: You are responsible for carefully reading and following our Medication Agreement. This must be signed before receiving any prescriptions from our practice. Safely store a copy of your signed Agreement. Violations to the Agreement will result in no further prescriptions. (Additional copies of our Medication Agreement are available upon request.) 10. Laws, Rules, & Regulations: All patients are expected to follow all Federal and [employer], [employer], Rules, [employer]. Ignorance of the Laws does not constitute a valid excuse.  11. Illegal drugs and Controlled Substances: The use of illegal substances (including, but not limited to marijuana and its derivatives) and/or the illegal use of any controlled substances is strictly prohibited. Violation of this rule may result in the immediate and permanent discontinuation of any and all prescriptions being written by our practice. The use of any illegal substances is prohibited. 12. Adopted CDC guidelines & recommendations: Target dosing levels will be at or below 60 MME/day. Use of benzodiazepines** is not recommended.  Exceptions: There are only two exceptions to the rule of not receiving pain medications from other Healthcare Providers. 1. Exception #1 (Emergencies): In the event of an emergency (i.e.: accident requiring emergency  care), you are allowed to receive additional pain medication. However, you are responsible for: As soon as you are able, call our office [phone], at any time of the day or night, and leave a message stating your name, the date and nature of the emergency, and the name and dose of the medication prescribed. In the event that your call is answered by a member of our staff, make sure to document and save the date, time, and the name of the person that took your information.  2. Exception #2 (Planned Surgery): In the event that you are scheduled by another doctor or dentist to have any type of surgery or procedure, you are allowed (for a period no longer than 30 days), to receive additional pain medication, for the acute post-op pain. However, in this case, you are responsible for picking up a copy of our "Post-op Pain Management for Surgeons" handout, and giving it to your surgeon or dentist. This document is available at our office, and does not require an appointment to obtain it. Simply go to our office during business hours (Monday-Thursday from 8:00 AM to 4:00 PM) (Friday 8:00 AM to 12:00 Noon) or if you have a scheduled appointment with [name], prior to your surgery, and ask for it by name. In addition, you are responsible for: calling our office [phone], at any time of the day or night, and leaving a message stating your name, name of your surgeon, type of surgery, and date of procedure or surgery. Failure to comply with your responsibilities may result in termination of therapy involving the controlled substances.  *Opioid medications include: morphine, codeine, oxycodone, oxymorphone, hydrocodone, hydromorphone, meperidine, tramadol, tapentadol, buprenorphine, fentanyl, methadone. **Benzodiazepine medications include: diazepam (Valium), alprazolam (Xanax), clonazepam (Klonopine), lorazepam (Ativan), clorazepate (Tranxene), chlordiazepoxide (Librium), estazolam (Prosom), oxazepam (Serax),  temazepam (Restoril), triazolam (Halcion) (Last updated: 01/17/2020) ____________________________________________________________________________________________

## 2020-06-19 ENCOUNTER — Other Ambulatory Visit: Payer: Self-pay

## 2020-06-19 ENCOUNTER — Ambulatory Visit: Payer: Medicare Other | Attending: Pain Medicine | Admitting: Pain Medicine

## 2020-06-19 ENCOUNTER — Encounter: Payer: Self-pay | Admitting: Pain Medicine

## 2020-06-19 VITALS — BP 130/89 | HR 82 | Temp 97.0°F | Resp 18 | Ht 69.0 in | Wt 205.0 lb

## 2020-06-19 DIAGNOSIS — G8929 Other chronic pain: Secondary | ICD-10-CM | POA: Diagnosis present

## 2020-06-19 DIAGNOSIS — M5442 Lumbago with sciatica, left side: Secondary | ICD-10-CM | POA: Insufficient documentation

## 2020-06-19 DIAGNOSIS — G894 Chronic pain syndrome: Secondary | ICD-10-CM | POA: Diagnosis present

## 2020-06-19 DIAGNOSIS — Z79899 Other long term (current) drug therapy: Secondary | ICD-10-CM | POA: Diagnosis present

## 2020-06-19 DIAGNOSIS — D49512 Neoplasm of unspecified behavior of left kidney: Secondary | ICD-10-CM | POA: Insufficient documentation

## 2020-06-19 DIAGNOSIS — Z79891 Long term (current) use of opiate analgesic: Secondary | ICD-10-CM | POA: Insufficient documentation

## 2020-06-19 DIAGNOSIS — M961 Postlaminectomy syndrome, not elsewhere classified: Secondary | ICD-10-CM | POA: Insufficient documentation

## 2020-06-19 MED ORDER — OXYCODONE HCL 5 MG PO TABS: 5.0000 mg | ORAL_TABLET | ORAL | 0 refills | Status: DC | PRN

## 2020-06-19 NOTE — Patient Instructions (Signed)
____________________________________________________________________________________________  Medication Recommendations and Reminders  Applies to: All patients receiving prescriptions (written and/or electronic).  Medication Rules & Regulations: These rules and regulations exist for your safety and that of others. They are not flexible and neither are we. Dismissing or ignoring them will be considered "non-compliance" with medication therapy, resulting in complete and irreversible termination of such therapy. (See document titled "Medication Rules" for more details.) In all conscience, because of safety reasons, we cannot continue providing a therapy where the patient does not follow instructions.  Pharmacy of record:   Definition: This is the pharmacy where your electronic prescriptions will be sent.   We do not endorse any particular pharmacy, however, we have experienced problems with [facility] not securing enough medication supply for the community.  We do not restrict you in your choice of pharmacy. However, once we write for your prescriptions, we will NOT be re-sending more prescriptions to fix restricted supply problems created by your pharmacy, or your insurance.   The pharmacy listed in the electronic medical record should be the one where you want electronic prescriptions to be sent.  If you choose to change pharmacy, simply notify our nursing staff.  Recommendations:  Keep all of your pain medications in a safe place, under lock and key, even if you live alone. We will NOT replace lost, stolen, or damaged medication.  After you fill your prescription, take 1 week's worth of pills and put them away in a safe place. You should keep a separate, properly labeled bottle for this purpose. The remainder should be kept in the original bottle. Use this as your primary supply, until it runs out. Once it's gone, then you know that you have 1 week's worth of medicine, and it is time to come  in for a prescription refill. If you do this correctly, it is unlikely that you will ever run out of medicine.  To make sure that the above recommendation works, it is very important that you make sure your medication refill appointments are scheduled at least 1 week before you run out of medicine. To do this in an effective manner, make sure that you do not leave the office without scheduling your next medication management appointment. Always ask the nursing staff to show you in your prescription , when your medication will be running out. Then arrange for the receptionist to get you a return appointment, at least 7 days before you run out of medicine. Do not wait until you have 1 or 2 pills left, to come in. This is very poor planning and does not take into consideration that we may need to cancel appointments due to bad weather, sickness, or emergencies affecting our staff.  DO NOT ACCEPT A "Partial Fill": If for any reason your pharmacy does not have enough pills/tablets to completely fill or refill your prescription, do not allow for a "partial fill". The law allows the pharmacy to complete that prescription within 72 hours, without requiring a new prescription. If they do not fill the rest of your prescription within those 72 hours, you will need a separate prescription to fill the remaining amount, which we will NOT provide. If the reason for the partial fill is your insurance, you will need to talk to the pharmacist about payment alternatives for the remaining tablets, but again, DO NOT ACCEPT A PARTIAL FILL, unless you can trust your pharmacist to obtain the remainder of the pills within 72 hours.  Prescription refills and/or changes in medication(s):  Prescription refills, and/or changes in dose or medication, will be conducted only during scheduled medication management appointments. (Applies to both, written and electronic prescriptions.)  No refills on procedure days. No medication will be  changed or started on procedure days. No changes, adjustments, and/or refills will be conducted on a procedure day. Doing so will interfere with the diagnostic portion of the procedure.  No phone refills. No medications will be "called into the pharmacy".  No Fax refills.  No weekend refills.  No Holliday refills.  No after hours refills.  Remember:  Business hours are:  Monday to Thursday 8:00 AM to 4:00 PM Provider's Schedule: Milinda Pointer, MD - Appointments are:  Medication management: Monday and Wednesday 8:00 AM to 4:00 PM Procedure day: Tuesday and Thursday 7:30 AM to 4:00 PM Gillis Santa, MD - Appointments are:  Medication management: Tuesday and Thursday 8:00 AM to 4:00 PM Procedure day: Monday and Wednesday 7:30 AM to 4:00 PM (Last update: 09/08/2019) ____________________________________________________________________________________________   ____________________________________________________________________________________________  Medication Rules  Purpose: To inform patients, and their family members, of our rules and regulations.  Applies to: All patients receiving prescriptions (written or electronic).  Pharmacy of record: Pharmacy where electronic prescriptions will be sent. If written prescriptions are taken to a different pharmacy, please inform the nursing staff. The pharmacy listed in the electronic medical record should be the one where you would like electronic prescriptions to be sent.  Electronic prescriptions: In compliance with the [place] (STOP) Act of 2017 (Session Lanny Cramp [phone]), effective February 18, 2018, all controlled substances must be electronically prescribed. Calling prescriptions to the pharmacy will cease to exist.  Prescription refills: Only during scheduled appointments. Applies to all prescriptions.  NOTE: The following applies primarily to controlled substances (Opioid* Pain  Medications).   Type of encounter (visit): For patients receiving controlled substances, face-to-face visits are required. (Not an option or up to the patient.)  Patient's responsibilities: 1. Pain Pills: Bring all pain pills to every appointment (except for procedure appointments). 2. Pill Bottles: Bring pills in original pharmacy bottle. Always bring the newest bottle. Bring bottle, even if empty. 3. Medication refills: You are responsible for knowing and keeping track of what medications you take and those you need refilled. The day before your appointment: write a list of all prescriptions that need to be refilled. The day of the appointment: give the list to the admitting nurse. Prescriptions will be written only during appointments. No prescriptions will be written on procedure days. If you forget a medication: it will not be "Called in", "Faxed", or "electronically sent". You will need to get another appointment to get these prescribed. No early refills. Do not call asking to have your prescription filled early. 4. Prescription Accuracy: You are responsible for carefully inspecting your prescriptions before leaving our office. Have the discharge nurse carefully go over each prescription with you, before taking them home. Make sure that your name is accurately spelled, that your address is correct. Check the name and dose of your medication to make sure it is accurate. Check the number of pills, and the written instructions to make sure they are clear and accurate. Make sure that you are given enough medication to last until your next medication refill appointment. 5. Taking Medication: Take medication as prescribed. When it comes to controlled substances, taking less pills or less frequently than prescribed is permitted and encouraged. Never take more pills than instructed. Never take medication more frequently than prescribed.  6.  Inform other Doctors: Always inform, all of your healthcare  providers, of all the medications you take. 7. Pain Medication from other Providers: You are not allowed to accept any additional pain medication from any other Doctor or Healthcare provider. There are two exceptions to this rule. (see below) In the event that you require additional pain medication, you are responsible for notifying us, as stated below. 8. Cough Medicine: Often these contain an opioid, such as codeine or hydrocodone. Never accept or take cough medicine containing these opioids if you are already taking an opioid* medication. The combination may cause respiratory failure and death. 9. Medication Agreement: You are responsible for carefully reading and following our Medication Agreement. This must be signed before receiving any prescriptions from our practice. Safely store a copy of your signed Agreement. Violations to the Agreement will result in no further prescriptions. (Additional copies of our Medication Agreement are available upon request.) 10. Laws, Rules, & Regulations: All patients are expected to follow all Federal and [employer], [employer], Rules, [employer] [facility]. Ignorance of the Laws does not constitute a valid excuse.  11. Illegal drugs and Controlled Substances: The use of illegal substances (including, but not limited to marijuana and its derivatives) and/or the illegal use of any controlled substances is strictly prohibited. Violation of this rule may result in the immediate and permanent discontinuation of any and all prescriptions being written by our practice. The use of any illegal substances is prohibited. 12. Adopted CDC guidelines & recommendations: Target dosing levels will be at or below 60 MME/day. Use of benzodiazepines** is not recommended.  Exceptions: There are only two exceptions to the rule of not receiving pain medications from other Healthcare Providers. 1. Exception #1 (Emergencies): In the event of an emergency (i.e.: accident requiring emergency care), you  are allowed to receive additional pain medication. However, you are responsible for: As soon as you are able, call our office [phone], at any time of the day or night, and leave a message stating your name, the date and nature of the emergency, and the name and dose of the medication prescribed. In the event that your call is answered by a member of our staff, make sure to document and save the date, time, and the name of the person that took your information.  2. Exception #2 (Planned Surgery): In the event that you are scheduled by another doctor or dentist to have any type of surgery or procedure, you are allowed (for a period no longer than 30 days), to receive additional pain medication, for the acute post-op pain. However, in this case, you are responsible for picking up a copy of our "Post-op Pain Management for Surgeons" handout, and giving it to your surgeon or dentist. This document is available at our office, and does not require an appointment to obtain it. Simply go to our office during business hours (Monday-Thursday from 8:00 AM to 4:00 PM) (Friday 8:00 AM to 12:00 Noon) or if you have a scheduled appointment with [name], prior to your surgery, and ask for it by name. In addition, you are responsible for: calling our office [phone], at any time of the day or night, and leaving a message stating your name, name of your surgeon, type of surgery, and date of procedure or surgery. Failure to comply with your responsibilities may result in termination of therapy involving the controlled substances.  *Opioid medications include: morphine, codeine, oxycodone, oxymorphone, hydrocodone, hydromorphone, meperidine, tramadol, tapentadol, buprenorphine, fentanyl, methadone. **Benzodiazepine medications include:  diazepam (Valium), alprazolam (Xanax), clonazepam (Klonopine), lorazepam (Ativan), clorazepate (Tranxene), chlordiazepoxide (Librium), estazolam (Prosom), oxazepam (Serax), temazepam  (Restoril), triazolam (Halcion) (Last updated: 01/17/2020) ____________________________________________________________________________________________   ____________________________________________________________________________________________  CBD (cannabidiol) WARNING  Applicable to: All individuals currently taking or considering taking CBD (cannabidiol) and, more important, all patients taking opioid analgesic controlled substances (pain medication). (Example: oxycodone; oxymorphone; hydrocodone; hydromorphone; morphine; methadone; tramadol; tapentadol; fentanyl; buprenorphine; butorphanol; dextromethorphan; meperidine; codeine; etc.)  Legal status: CBD remains a Schedule I drug prohibited for any use. CBD is illegal with one exception. In the [place], CBD has a limited Transport planner (FDA) approval for the treatment of two specific types of epilepsy disorders. Only one CBD product has been approved by the FDA for this purpose: "Epidiolex". FDA is aware that some companies are marketing products containing cannabis and cannabis-derived compounds in ways that violate the [employer], Drug and Cosmetic Act [place] Act) and that may put the health and safety of consumers at risk. The FDA, a Federal agency, has not enforced the CBD status since 2018.   Legality: Some manufacturers ship CBD products nationally, which is illegal. Often such products are sold online and are therefore available throughout the country. CBD is openly sold in head shops and health food stores in some states where such sales have not been explicitly legalized. Selling unapproved products with unsubstantiated therapeutic claims is not only a violation of the law, but also can put patients at risk, as these products have not been proven to be safe or effective. Federal illegality makes it difficult to conduct research on CBD.  Reference: "FDA Regulation of Cannabis and Cannabis-Derived Products, Including  Cannabidiol (CBD)" - [employer]  Warning: CBD is not FDA approved and has not undergo the same manufacturing controls as prescription drugs.  This means that the purity and safety of available CBD may be questionable. Most of the time, despite manufacturer's claims, it is contaminated with THC (delta-9-tetrahydrocannabinol - the chemical in marijuana responsible for the "HIGH").  When this is the case, the [facility] contaminant will trigger a positive urine drug screen (UDS) test for Marijuana (carboxy-THC). Because a positive UDS for any illicit substance is a violation of our medication agreement, your opioid analgesics (pain medicine) may be permanently discontinued.  MORE ABOUT CBD  General Information: CBD  is a derivative of the Marijuana (cannabis sativa) plant discovered in 15. It is one of the 113 identified substances found in Marijuana. It accounts for up to 40% of the plant's extract. As of 2018, preliminary clinical studies on CBD included research for the treatment of anxiety, movement disorders, and pain. CBD is available and consumed in multiple forms, including inhalation of smoke or vapor, as an aerosol spray, and by mouth. It may be supplied as an oil containing CBD, capsules, dried cannabis, or as a liquid solution. CBD is thought not to be as psychoactive as THC (delta-9-tetrahydrocannabinol - the chemical in marijuana responsible for the "HIGH"). Studies suggest that CBD may interact with different biological target receptors in the body, including cannabinoid and other neurotransmitter receptors. As of 2018 the mechanism of action for its biological effects has not been determined.  Side-effects  Adverse reactions: Dry mouth, diarrhea, decreased appetite, fatigue, drowsiness, malaise, weakness, sleep disturbances, and others.  Drug interactions: CBC may interact with  other medications such as blood-thinners. (Last update: 09/25/2019) ____________________________________________________________________________________________

## 2020-06-19 NOTE — Progress Notes (Signed)
Nursing Pain Medication Assessment:  Safety precautions to be maintained throughout the outpatient stay will include: orient to surroundings, keep bed in low position, maintain call bell within reach at all times, provide assistance with transfer out of bed and ambulation.  Medication Inspection Compliance: Pill count conducted under aseptic conditions, in front of the patient. Neither the pills nor the bottle was removed from the patient's sight at any time. Once count was completed pills were immediately returned to the patient in their original bottle.  Medication: Oxycodone IR Pill/Patch Count: 83 of 180 pills remain Pill/Patch Appearance: Markings consistent with prescribed medication Bottle Appearance: Standard pharmacy container. Clearly labeled. Filled Date: 04 / 06 / 2022 Last Medication intake:  Today

## 2020-06-19 NOTE — Progress Notes (Signed)
PROVIDER NOTE: Information contained herein reflects review and annotations entered in association with encounter. Interpretation of such information and data should be left to medically-trained personnel. Information provided to patient can be located elsewhere in the medical record under "Patient Instructions". Document created using STT-dictation technology, any transcriptional errors that may result from process are unintentional.    Patient: Kenneth Osborn  Service Category: E/M  Provider: Gaspar Cola, MD  DOB: 06/02/1955  DOS: 06/19/2020  Specialty: Interventional Pain Management  MRN: [id number]  Setting: Ambulatory outpatient  PCP: Kenneth Pink, MD  Type: Established Patient    Referring Provider: [name], MD  Location: Office  Delivery: Face-to-face     HPI  [name], a 65 y.o. 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 Mr. Veals primary complain today is Back Pain (Mid and low) Last encounter: My last encounter with him was on 05/24/2020. Pertinent problems: 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 Beverely Pace at [facility] on 01/04/2013); Neurogenic pain; Thoracic facet syndrome (T8-10); Lumbar facet syndrome (Bilateral) (R>L); Cervical facet syndrome (Right); Cervical spondylosis; Lumbar spondylosis; Chronic upper extremity pain (Left); Chronic cervical radicular pain (Left); Chronic upper back pain; History of thoracic spine surgery (S/P T9-10 IVD spacer); Failed back surgical syndrome; Chronic musculoskeletal pain; Myofascial pain; Chronic lower extremity pain (Left); Chronic radicular lumbar pain (left L4 dermatomal pain); Arthralgia of shoulder; Chronic tension-type headache, not intractable; 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); Chronic pain  syndrome; Headache disorder; Trigger point posterior superior iliac spine (PSIS) (Right); 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 Pain Assessment: Severity of Chronic pain is reported as a 4 /10. Location: Back Mid,Lower/posterior legs to ankle. Onset: More than a month ago. Quality: Aching,Dull,Constant. Timing: Constant. Modifying factor(s): medications, heat, rest. Vitals:  height is 5' 9"  (1.753 m) and weight is 205 lb (93 kg). His temporal temperature is 97 F (36.1 C) (abnormal). His blood pressure is 130/89 and his pulse is 82. His respiration is 18 and oxygen saturation is 98%.   Reason for encounter: medication management.   The patient indicates doing well with the current medication regimen. No adverse reactions or side effects reported to the medications.   RTCB: 09/21/2020 Nonopioids transferred 11/22/2019: Magnesium, Flexeril, and Neurontin.  Pharmacotherapy Assessment   Analgesic: Oxycodone IR 5 mg, 1 tab p.o. every 4 hours (30 mg/day of oxycodone) MME/day:45 mg/day.   Monitoring: [place] PMP: PDMP reviewed during this encounter.       Pharmacotherapy: No side-effects or adverse reactions reported. Compliance: No problems identified. Effectiveness: Clinically acceptable.  Hart Rochester, RN  06/19/2020 11:47 AM  Sign when Signing Visit Nursing Pain Medication Assessment:  Safety precautions to be maintained throughout the outpatient stay will include: orient to surroundings, keep bed in low  position, maintain call bell within reach at all times, provide assistance with transfer out of bed and ambulation.  Medication Inspection Compliance: Pill count conducted under aseptic conditions, in front of the  patient. Neither the pills nor the bottle was removed from the patient's sight at any time. Once count was completed pills were immediately returned to the patient in their original bottle.  Medication: Oxycodone IR Pill/Patch Count: 83 of 180 pills remain Pill/Patch Appearance: Markings consistent with prescribed medication Bottle Appearance: Standard pharmacy container. Clearly labeled. Filled Date: 04 / 06 / 2022 Last Medication intake:  Today    UDS:  Summary  Date Value Ref Range Status  03/18/2018 FINAL  Final    Comment:    ==================================================================== TOXASSURE SELECT 13 (MW) ==================================================================== Test                             Result       Flag       Units Drug Present and Declared for Prescription Verification   Desmethyldiazepam              123          EXPECTED   ng/mg creat   Oxazepam                       >897         EXPECTED   ng/mg creat   Temazepam                      291          EXPECTED   ng/mg creat    Desmethyldiazepam, oxazepam, and temazepam are expected    metabolites of diazepam. Desmethyldiazepam and oxazepam are also    expected metabolites of other drugs, including chlordiazepoxide,    prazepam, clorazepate, and halazepam. Oxazepam is an expected    metabolite of temazepam. Oxazepam and temazepam are also    available as scheduled prescription medications.   Oxycodone                      1956         EXPECTED   ng/mg creat   Oxymorphone                    4052         EXPECTED   ng/mg creat   Noroxycodone                   957          EXPECTED   ng/mg creat   Noroxymorphone                 485          EXPECTED   ng/mg creat    Sources of oxycodone are  scheduled prescription medications.    Oxymorphone, noroxycodone, and noroxymorphone are expected    metabolites of oxycodone. Oxymorphone is also available as a    scheduled prescription medication. Drug Absent but Declared for Prescription Verification   Alprazolam                     Not Detected UNEXPECTED ng/mg creat ==================================================================== Test                      Result    Flag   Units      Ref Range   Creatinine              223  mg/dL      >=20 ==================================================================== Declared Medications:  The flagging and interpretation on this report are based on the  following declared medications.  Unexpected results may arise from  inaccuracies in the declared medications.  **Note: The testing scope of this panel includes these medications:  Alprazolam (Xanax)  Diazepam (Valium)  Oxycodone  **Note: The testing scope of this panel does not include following  reported medications:  Aspirin  Cetirizine (Zyrtec)  Cyclobenzaprine (Flexeril)  Diltiazem (Cardizem)  Ezetimibe (Zetia)  Gabapentin (Neurontin)  Insulin (Humulin)  Isosorbide (Imdur)  Magnesium  Meclizine (Antivert)  Metformin (Glucophage)  Metoprolol (Toprol)  Montelukast (Singulair)  Naloxone (Narcan)  Niacin (Niaspan)  Nitroglycerin (Nitrostat)  Omega-3 Fatty Acids (Fish Oil)  Omeprazole (Nexium)  Prasugrel (Effient)  Rosuvastatin (Crestor) ==================================================================== For clinical consultation, please call [phone]. ====================================================================      ROS  Constitutional: Denies any fever or chills Gastrointestinal: No reported hemesis, hematochezia, vomiting, or acute GI distress Musculoskeletal: Denies any acute onset joint swelling, redness, loss of ROM, or weakness Neurological: No reported episodes of acute onset apraxia,  aphasia, dysarthria, agnosia, amnesia, paralysis, loss of coordination, or loss of consciousness  Medication Review  ALPRAZolam, Glucagon, Magnesium Oxide, aspirin EC, cetirizine, cyclobenzaprine, diazepam, diltiazem, esomeprazole, ezetimibe, gabapentin, insulin regular human CONCENTRATED, meclizine, metoprolol succinate, montelukast, niacin, oxyCODONE, polyethylene glycol, prasugrel, rosuvastatin, and senna-docusate  History Review  Allergy: Mr. Russett has No Known Allergies. Drug: Mr. Sippel  reports no history of drug use. Alcohol:  reports no history of alcohol use. Tobacco:  reports that he has never smoked. He has never used smokeless tobacco. Social: Mr. Golebiewski  reports that he has never smoked. He has never used smokeless tobacco. He reports that he does not drink alcohol and does not use drugs. Medical:  has a past medical history of Acute postoperative pain (12/03/2018), Allergic rhinitis (12/30/2012), Anginal pain ([place]), Anxiety, Bronchitis, Can't get food down (08/12/2014), Cancer ([place]) (01/2020), Chronic back pain, Concussion (09/2015), Coronary artery disease, DDD (degenerative disc disease), cervical, Dehydration symptoms, Diabetes mellitus without complication ([place]), Dysphagia, GERD (gastroesophageal reflux disease), History of Meniere's disease (12/21/2014), History of thoracic spine surgery (S/P T9-10 IVD spacer) (12/21/2014), Hypercholesteremia, Hyperlipidemia, Hypertension, Meniere's disease, Myocardial infarction ([place]) (2012), Neuromuscular disorder ([place]), Severe sepsis ([place]) (02/07/2020), Short-segment Barrett's esophagus, and Sleep apnea. Surgical: Mr. Parkerson  has a past surgical history that includes Cardiovascular stress test; Cardiac catheterization; Labrinthectomy; mastoid shunt (Bilateral); Back surgery; Shoulder arthroscopy with subacromial decompression (Left, 04/06/2012); ARTHRODESIS ANTERIOR ANTERIOR CERVICLE SPINE (01/04/2013); Colonoscopy with propofol (N/A, 09/19/2014);  Esophagogastroduodenoscopy (N/A, 09/19/2014); Savory dilation (N/A, 09/19/2014); Spinal cord stimulator implant (Right); Cardiac catheterization (N/A, 06/15/2015); Cardiac catheterization (N/A, 06/15/2015); Pulse generator implant (N/A, 01/18/2019); Lumbar spinal cord simulator lead removal (Right, 08/09/2019); IR Perc Cholecystostomy (02/09/2020); IR Radiologist Eval & Mgmt (03/14/2020); Coronary angioplasty; Appendectomy; and Robot assisted laparoscopic nephrectomy (Left, 04/20/2020). Family: family history includes Cancer in his sister; Diabetes in his maternal grandmother, mother, and paternal grandmother; Heart disease in his father, maternal aunt, maternal uncle, and mother.  Laboratory Chemistry Profile   Renal Lab Results  Component Value Date   BUN 19 04/26/2020   CREATININE 1.64 (H) 04/26/2020   GFRAA 58 (L) 01/08/2019   GFRNONAA 46 (L) 04/26/2020     Hepatic Lab Results  Component Value Date   AST 25 04/23/2020   ALT 24 04/23/2020   ALBUMIN 3.3 (L) 04/23/2020   ALKPHOS 67 04/23/2020   HCVAB NON REACTIVE 02/08/2020   LIPASE 22 04/23/2020   AMMONIA 24 02/07/2020  Electrolytes Lab Results  Component Value Date   NA 140 04/26/2020   K 4.2 04/26/2020   CL 102 04/26/2020   CALCIUM 9.7 04/26/2020   MG 1.7 02/16/2020   PHOS 2.6 02/14/2020     Bone No results found for: VD25OH, VD125OH2TOT, [id number], AY3016WF0, 25OHVITD1, 25OHVITD2, 25OHVITD3, TESTOFREE, TESTOSTERONE   Inflammation (CRP: Acute Phase) (ESR: Chronic Phase) Lab Results  Component Value Date   CRP 1.4 (H) 03/22/2015   ESRSEDRATE 9 03/22/2015   LATICACIDVEN 0.8 04/23/2020       Note: Above Lab results reviewed.  Recent Imaging Review  NM Pulmonary Perfusion CLINICAL DATA:  65 year old male with shortness of breath.  EXAM: NUCLEAR MEDICINE PERFUSION LUNG SCAN  TECHNIQUE: Perfusion images were obtained in multiple projections after intravenous injection of radiopharmaceutical.  Ventilation scans  intentionally deferred if perfusion scan and chest x-ray adequate for interpretation during COVID 19 epidemic.  RADIOPHARMACEUTICALS:  4.4 mCi Tc-12mMAA IV  COMPARISON:  04/23/2020 chest radiograph  FINDINGS: No which shaped perfusion defects are noted to suggest pulmonary emboli.  IMPRESSION: Pulmonary embolism absent  Electronically Signed   By: JMargarette CanadaM.D.   On: 04/23/2020 14:48 CT ABDOMEN PELVIS WO CONTRAST CLINICAL DATA:  Status post nephrectomy and cholecystectomy. Abdominal distension, rebound tenderness  EXAM: CT ABDOMEN AND PELVIS WITHOUT CONTRAST  TECHNIQUE: Multidetector CT imaging of the abdomen and pelvis was performed following the standard protocol without IV contrast.  COMPARISON:  03/07/2020  FINDINGS: Lower chest: Small right pleural effusion is present with compressive atelectasis of the right lower lobe. Mild left basilar atelectasis. Coronary artery stenting has been performed. Cardiac size within normal limits. Trace pericardial effusion is likely physiologic.  Hepatobiliary: Status post cholecystectomy and removal of the cholecystostomy catheter. Poorly circumscribed fluid within the gallbladder fossa. No intra or extrahepatic biliary ductal dilation. Liver unremarkable.  Pancreas: Unremarkable  Spleen: Unremarkable  Adrenals/Urinary Tract: Interval left nephrectomy with small loculated fluid collection within the nephrectomy bed measuring 3.6 x 6.3 cm. This abuts and obscures the left adrenal gland. Right adrenal gland and kidney are unremarkable. Bladder is unremarkable.  Stomach/Bowel: Stomach, small bowel, and large bowel are unremarkable. Appendix absent. Trace perihepatic ascites. Punctate foci of free intraperitoneal gas within the abdomen noted anteriorly, likely postsurgical in nature. Subcutaneous gas within the anterior abdominal wall within the suprapubic region is also likely postsurgical in  nature.  Vascular/Lymphatic: Mild aortoiliac atherosclerotic calcification. No aortic aneurysm. No pathologic adenopathy within the abdomen and pelvis.  Reproductive: Prostate is unremarkable.  Other: Tiny fat containing umbilical hernia. Infiltration within the anterior abdominal wall may be postsurgical in nature and appears unchanged from prior examination. Rectum unremarkable.  Musculoskeletal: No acute bone abnormality.  IMPRESSION: Interval left nephrectomy with 6.3 cm loculated fluid collection within the nephrectomy bed.  Interval cholecystectomy with poorly circumscribed fluid within the gallbladder fossa and trace perihepatic ascites.  Punctate foci of gas within the peritoneum and subcutaneous gas within the suprapubic region within expectation given recent laparoscopic surgery.  Small right pleural effusion with bibasilar atelectasis.  Aortic Atherosclerosis (ICD10-I70.0).  Electronically Signed   By: AFidela SalisburyMD   On: 04/23/2020 02:30 DG Chest Port 1 View CLINICAL DATA:  Weakness, hypoxia  EXAM: PORTABLE CHEST 1 VIEW  COMPARISON:  02/13/2020  FINDINGS: Lung volumes are small and there is mild elevation of the right hemidiaphragm with asymmetric right basilar atelectasis. No superimposed focal pulmonary infiltrate. No pneumothorax or pleural effusion. Cardiac size within normal limits. Pulmonary vascularity normal. No acute bone  abnormality.  IMPRESSION: Pulmonary hypoinflation  Electronically Signed   By: Fidela Salisbury MD   On: 04/23/2020 02:19 Note: Reviewed        Physical Exam  General appearance: Well nourished, well developed, and well hydrated. In no apparent acute distress Mental status: Alert, oriented x 3 (person, place, & time)       Respiratory: No evidence of acute respiratory distress Eyes: PERLA Vitals: BP 130/89   Pulse 82   Temp (!) 97 F (36.1 C) (Temporal)   Resp 18   Ht 5' 9"  (1.753 m)   Wt 205 lb (93 kg)   SpO2  98%   BMI 30.27 kg/m  BMI: Estimated body mass index is 30.27 kg/m as calculated from the following:   Height as of this encounter: 5' 9"  (1.753 m).   Weight as of this encounter: 205 lb (93 kg). Ideal: Ideal body weight: 70.7 kg (155 lb 13.8 oz) Adjusted ideal body weight: 79.6 kg (175 lb 8.3 oz)  Assessment   Status Diagnosis  Controlled Controlled Controlled 1. 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 Beverely Pace at [facility] on 01/04/2013)   2. Failed back surgical syndrome   3. Chronic low back pain (Bilateral) (L>R) w/ sciatica (Left)   4. Chronic pain syndrome   5. Pharmacologic therapy   6. Chronic use of opiate for therapeutic purpose   7. 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  Plan of Care  Problem-specific:  No problem-specific Assessment & Plan notes found for this encounter.  Mr. AXIEL FJELD has a current medication list which includes the following long-term medication(s): cetirizine, cyclobenzaprine, esomeprazole, ezetimibe, gabapentin, humulin r u-500 kwikpen, magnesium oxide, metoprolol succinate, montelukast, niacin, [START ON 06/23/2020] oxycodone, [START ON 07/23/2020] oxycodone, and [START ON 08/22/2020] oxycodone.  Pharmacotherapy (Medications Ordered): Meds ordered this encounter  Medications  . oxyCODONE (OXY IR/ROXICODONE) 5 MG immediate release tablet    Sig: Take 1 tablet (5 mg total) by mouth every 4 (four) hours as needed for severe pain. Must last 30 days.    Dispense:  180 tablet    Refill:  0    Not a duplicate. Do NOT delete! Dispense 1 day early if closed on refill date. Avoid benzodiazepines within 8 hours of opioids. Do not send refill requests.  Marland Kitchen oxyCODONE (OXY IR/ROXICODONE) 5 MG immediate release tablet    Sig: Take 1 tablet (5 mg total) by mouth every 4 (four) hours as needed for severe pain. Must last 30 days.    Dispense:  180 tablet     Refill:  0    Not a duplicate. Do NOT delete! Dispense 1 day early if closed on refill date. Avoid benzodiazepines within 8 hours of opioids. Do not send refill requests.   Orders:  Orders Placed This Encounter  Procedures  . ToxASSURE Select 13 (MW), Urine    Volume: 30 ml(s). Minimum 3 ml of urine is needed. Document temperature of fresh sample. Indications: Long term (current) use of opiate analgesic (H67.591)    Order Specific Question:   Release to patient    Answer:   Immediate   Follow-up plan:   Return in about 3 months (around 09/21/2020) for (F2F), (MM).      Considering: NOTE: EFFIENT Anticoagulation (Stop: 7-10 days  Re-start: 6 hrs)    Palliative PRN treatment(s): Palliative right PSIS MNB/TPI  Diagnostic/palliative right lumbar facet block #3  Diagnostic/palliative left  lumbar facet block #2  Palliative left lumbar facet RFA #2 (last done 12/03/2018)  Palliative right lumbar facet RFA #3 (last done 12/02/2019)        Recent Visits Date Type Provider Dept  05/24/20 Office Visit Milinda Pointer, MD [facility]  Showing recent visits within past 90 days and meeting all other requirements Today's Visits Date Type Provider Dept  06/19/20 Office Visit Milinda Pointer, MD [facility]  Showing today's visits and meeting all other requirements Future Appointments Date Type Provider Dept  07/19/20 Appointment Milinda Pointer, MD [facility]  Showing future appointments within next 90 days and meeting all other requirements  I discussed the assessment and treatment plan with the patient. The patient was provided an opportunity to ask questions and all were answered. The patient agreed with the plan and demonstrated an understanding of the instructions.  Patient advised to call back or seek an in-person evaluation if the symptoms or condition worsens.  Duration of encounter: 30 minutes.  Note by: Gaspar Cola, MD Date:  06/19/2020; Time: 12:07 PM

## 2020-06-21 ENCOUNTER — Telehealth: Payer: Self-pay | Admitting: Pain Medicine

## 2020-06-21 NOTE — Telephone Encounter (Signed)
Spoke with patient and informed him that his meds were not due to be filled until 5-6.  Asked him to call back to the pharmacy tomorrow and see if it shows up and if not, call us back.

## 2020-06-27 LAB — TOXASSURE SELECT 13 (MW), URINE

## 2020-07-19 ENCOUNTER — Encounter: Payer: Medicare Other | Admitting: Pain Medicine

## 2020-09-19 NOTE — Progress Notes (Signed)
PROVIDER NOTE: Information contained herein reflects review and annotations entered in association with encounter. Interpretation of such information and data should be left to medically-trained personnel. Information provided to patient can be located elsewhere in the medical record under "Patient Instructions". Document created using STT-dictation technology, any transcriptional errors that may result from process are unintentional.    Patient: Kenneth Osborn  Service Category: E/M  Provider: Gaspar Cola, MD  DOB: April 17, 1955  DOS: 09/20/2020  Specialty: Interventional Pain Management  MRN: [id number]  Setting: Ambulatory outpatient  PCP: [name], MD  Type: Established Patient    Referring Provider: [name], MD  Location: Office  Delivery: Face-to-face     HPI  [name], a 65 y.o. year old male, is here today because of his Chronic pain syndrome [G89.4]. [name] primary complain today is Back Pain (Lumbar bilateral right is worse ) and Leg Pain (Bilateral ) Last encounter: My last encounter with him was on 06/21/2020. Pertinent problems: 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 Beverely Pace at [facility] on 01/04/2013); Neurogenic pain; Thoracic facet syndrome (T8-10); Lumbar facet syndrome (Bilateral) (R>L); Cervical facet syndrome (Right); Cervical spondylosis; Lumbar spondylosis; Chronic upper extremity pain (Left); Chronic cervical radicular pain (Left); Chronic upper back pain; History of thoracic spine surgery (S/P T9-10 IVD spacer); Failed back surgical syndrome; Chronic musculoskeletal pain; Myofascial pain; Chronic lower extremity pain (Left); Chronic radicular lumbar pain (left L4 dermatomal pain); Arthralgia of shoulder; Chronic tension-type headache, not intractable; 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); Chronic pain syndrome; Headache disorder; Trigger point posterior superior iliac spine (PSIS) (Right); 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 Pain Assessment: Severity of Chronic pain is reported as a 3 /10. Location: Back Lower, Left, Right/possibly down the legs.. Onset: More than a month ago. Quality: Discomfort, Constant. Timing: Constant. Modifying factor(s):  Marland Kitchen Vitals:  height is 5' 9"  (1.753 m) and weight is 195 lb (88.5 kg). His temporal temperature is 96.8 F (36 C) (abnormal). His blood pressure is 128/85 and his pulse is 83. His respiration is 16 and oxygen saturation is 98%.   Reason for encounter: medication management.   The patient indicates doing well with the current medication regimen. No adverse reactions or side effects reported to the medications.   RTCB: 12/20/2020 Nonopioids transferred 11/22/2019: Magnesium, Flexeril, and Neurontin.  Pharmacotherapy Assessment  Analgesic: Oxycodone IR 5 mg, 1 tab p.o. every 4 hours (30 mg/day of oxycodone) MME/day: 45 mg/day.   Monitoring: [place] PMP: PDMP reviewed during this encounter.       Pharmacotherapy: No side-effects or adverse reactions reported. Compliance: No problems identified. Effectiveness: Clinically acceptable.  No notes on file  UDS:  Summary  Date Value Ref Range Status  06/19/2020 Note  Final    Comment:    ==================================================================== [employer]  (MW) ==================================================================== Test  Result       Flag       Units  Drug Present and Declared for Prescription Verification   Desmethyldiazepam              101          EXPECTED   ng/mg creat   Oxazepam                       713          EXPECTED   ng/mg creat   Temazepam                      259          EXPECTED   ng/mg creat    Desmethyldiazepam, oxazepam, and temazepam are expected metabolites    of diazepam. Desmethyldiazepam and oxazepam are also expected    metabolites of other drugs, including chlordiazepoxide, prazepam,    clorazepate, and halazepam. Oxazepam is an expected metabolite of    temazepam. Oxazepam and temazepam are also available as scheduled    prescription medications.    Alprazolam                     32           EXPECTED   ng/mg creat   Alpha-hydroxyalprazolam        24           EXPECTED   ng/mg creat    Source of alprazolam is a scheduled prescription medication. Alpha-    hydroxyalprazolam is an expected metabolite of alprazolam.    Oxycodone                      1138         EXPECTED   ng/mg creat   Oxymorphone                    2681         EXPECTED   ng/mg creat   Noroxycodone                   637          EXPECTED   ng/mg creat   Noroxymorphone                 435          EXPECTED   ng/mg creat    Sources of oxycodone are scheduled prescription medications.    Oxymorphone, noroxycodone, and noroxymorphone are expected    metabolites of oxycodone. Oxymorphone is also available as a    scheduled prescription medication.  ==================================================================== Test                      Result    Flag   Units      Ref Range   Creatinine              104              mg/dL      >=20 ==================================================================== Declared Medications:  The flagging and interpretation on this report are based on the  following  declared medications.  Unexpected results may arise from  inaccuracies in the declared medications.   **Note: The testing scope of this panel includes these medications:   Alprazolam (Xanax)  Diazepam (Valium)  Oxycodone   **Note: The testing scope of this  panel does not include the  following reported medications:   Aspirin  Cetirizine (Zyrtec)  Cyclobenzaprine (Flexeril)  Diltiazem (Cardizem)  Esomeprazole (Nexium)  Ezetimibe (Zetia)  Gabapentin (Neurontin)  Glucagon  Insulin  Magnesium (Mag-Ox)  Meclizine (Antivert)  Metoprolol (Toprol)  Montelukast (Singulair)  Niacin  Polyethylene Glycol (MiraLAX)  Prasugrel (Effient)  Rosuvastatin (Crestor)  Sennosides (Senokot) ==================================================================== For clinical consultation, please call [phone]. ====================================================================      ROS  Constitutional: Denies any fever or chills Gastrointestinal: No reported hemesis, hematochezia, vomiting, or acute GI distress Musculoskeletal: Denies any acute onset joint swelling, redness, loss of ROM, or weakness Neurological: No reported episodes of acute onset apraxia, aphasia, dysarthria, agnosia, amnesia, paralysis, loss of coordination, or loss of consciousness  Medication Review  ALPRAZolam, Glucagon, Magnesium Oxide, aspirin EC, cetirizine, cyclobenzaprine, diazepam, diltiazem, enalapril, esomeprazole, ezetimibe, gabapentin, insulin regular human CONCENTRATED, meclizine, metFORMIN, metoprolol succinate, montelukast, niacin, nitroGLYCERIN, oxyCODONE, polyethylene glycol, prasugrel, rosuvastatin, and senna-docusate  History Review  Allergy: Mr. Kushnir has No Known Allergies. Drug: Mr. Coffin  reports no history of drug use. Alcohol:  reports no history of alcohol use. Tobacco:  reports that he has never smoked. He has never used smokeless tobacco. Social: Mr. Rivadeneira  reports that he has never  smoked. He has never used smokeless tobacco. He reports that he does not drink alcohol and does not use drugs. Medical:  has a past medical history of Acute postoperative pain (12/03/2018), Allergic rhinitis (12/30/2012), Anginal pain ([place]), Anxiety, Bronchitis, Can't get food down (08/12/2014), Cancer ([place]) (01/2020), Chronic back pain, Concussion (09/2015), Coronary artery disease, DDD (degenerative disc disease), cervical, Dehydration symptoms, Diabetes mellitus without complication ([place]), Dysphagia, GERD (gastroesophageal reflux disease), History of Meniere's disease (12/21/2014), History of thoracic spine surgery (S/P T9-10 IVD spacer) (12/21/2014), Hypercholesteremia, Hyperlipidemia, Hypertension, Meniere's disease, Myocardial infarction ([place]) (2012), Neuromuscular disorder ([place]), Severe sepsis ([place]) (02/07/2020), Short-segment Barrett's esophagus, and Sleep apnea. Surgical: Mr. Wyndham  has a past surgical history that includes Cardiovascular stress test; Cardiac catheterization; Labrinthectomy; mastoid shunt (Bilateral); Back surgery; Shoulder arthroscopy with subacromial decompression (Left, 04/06/2012); ARTHRODESIS ANTERIOR ANTERIOR CERVICLE SPINE (01/04/2013); Colonoscopy with propofol (N/A, 09/19/2014); Esophagogastroduodenoscopy (N/A, 09/19/2014); Savory dilation (N/A, 09/19/2014); Spinal cord stimulator implant (Right); Cardiac catheterization (N/A, 06/15/2015); Cardiac catheterization (N/A, 06/15/2015); Pulse generator implant (N/A, 01/18/2019); Lumbar spinal cord simulator lead removal (Right, 08/09/2019); IR Perc Cholecystostomy (02/09/2020); IR Radiologist Eval & Mgmt (03/14/2020); Coronary angioplasty; Appendectomy; and Robot assisted laparoscopic nephrectomy (Left, 04/20/2020). Family: family history includes Cancer in his sister; Diabetes in his maternal grandmother, mother, and paternal grandmother; Heart disease in his father, maternal aunt, maternal uncle, and mother.  Laboratory Chemistry Profile    Renal Lab Results  Component Value Date   BUN 19 04/26/2020   CREATININE 1.64 (H) 04/26/2020   GFRAA 58 (L) 01/08/2019   GFRNONAA 46 (L) 04/26/2020    Hepatic Lab Results  Component Value Date   AST 25 04/23/2020   ALT 24 04/23/2020   ALBUMIN 3.3 (L) 04/23/2020   ALKPHOS 67 04/23/2020   HCVAB NON REACTIVE 02/08/2020   LIPASE 22 04/23/2020   AMMONIA 24 02/07/2020    Electrolytes Lab Results  Component Value Date   NA 140 04/26/2020   K 4.2 04/26/2020   CL 102 04/26/2020   CALCIUM 9.7 04/26/2020   MG 1.7 02/16/2020   PHOS 2.6 02/14/2020    Bone No results found for: VD25OH, VD125OH2TOT, XF8182XH3, ZJ6967EL3, 25OHVITD1, 25OHVITD2, 25OHVITD3, TESTOFREE, TESTOSTERONE  Inflammation (CRP: Acute Phase) (ESR: Chronic Phase) Lab Results  Component Value  Date   CRP 1.4 (H) 03/22/2015   ESRSEDRATE 9 03/22/2015   LATICACIDVEN 0.8 04/23/2020         Note: Above Lab results reviewed.  Recent Imaging Review  NM Pulmonary Perfusion CLINICAL DATA:  64 year old male with shortness of breath.  EXAM: NUCLEAR MEDICINE PERFUSION LUNG SCAN  TECHNIQUE: Perfusion images were obtained in multiple projections after intravenous injection of radiopharmaceutical.  Ventilation scans intentionally deferred if perfusion scan and chest x-ray adequate for interpretation during COVID 19 epidemic.  RADIOPHARMACEUTICALS:  4.4 mCi Tc-79mMAA IV  COMPARISON:  04/23/2020 chest radiograph  FINDINGS: No which shaped perfusion defects are noted to suggest pulmonary emboli.  IMPRESSION: Pulmonary embolism absent  Electronically Signed   By: JMargarette CanadaM.D.   On: 04/23/2020 14:48 CT ABDOMEN PELVIS WO CONTRAST CLINICAL DATA:  Status post nephrectomy and cholecystectomy. Abdominal distension, rebound tenderness  EXAM: CT ABDOMEN AND PELVIS WITHOUT CONTRAST  TECHNIQUE: Multidetector CT imaging of the abdomen and pelvis was performed following the standard protocol without IV  contrast.  COMPARISON:  03/07/2020  FINDINGS: Lower chest: Small right pleural effusion is present with compressive atelectasis of the right lower lobe. Mild left basilar atelectasis. Coronary artery stenting has been performed. Cardiac size within normal limits. Trace pericardial effusion is likely physiologic.  Hepatobiliary: Status post cholecystectomy and removal of the cholecystostomy catheter. Poorly circumscribed fluid within the gallbladder fossa. No intra or extrahepatic biliary ductal dilation. Liver unremarkable.  Pancreas: Unremarkable  Spleen: Unremarkable  Adrenals/Urinary Tract: Interval left nephrectomy with small loculated fluid collection within the nephrectomy bed measuring 3.6 x 6.3 cm. This abuts and obscures the left adrenal gland. Right adrenal gland and kidney are unremarkable. Bladder is unremarkable.  Stomach/Bowel: Stomach, small bowel, and large bowel are unremarkable. Appendix absent. Trace perihepatic ascites. Punctate foci of free intraperitoneal gas within the abdomen noted anteriorly, likely postsurgical in nature. Subcutaneous gas within the anterior abdominal wall within the suprapubic region is also likely postsurgical in nature.  Vascular/Lymphatic: Mild aortoiliac atherosclerotic calcification. No aortic aneurysm. No pathologic adenopathy within the abdomen and pelvis.  Reproductive: Prostate is unremarkable.  Other: Tiny fat containing umbilical hernia. Infiltration within the anterior abdominal wall may be postsurgical in nature and appears unchanged from prior examination. Rectum unremarkable.  Musculoskeletal: No acute bone abnormality.  IMPRESSION: Interval left nephrectomy with 6.3 cm loculated fluid collection within the nephrectomy bed.  Interval cholecystectomy with poorly circumscribed fluid within the gallbladder fossa and trace perihepatic ascites.  Punctate foci of gas within the peritoneum and subcutaneous  gas within the suprapubic region within expectation given recent laparoscopic surgery.  Small right pleural effusion with bibasilar atelectasis.  Aortic Atherosclerosis (ICD10-I70.0).  Electronically Signed   By: AFidela SalisburyMD   On: 04/23/2020 02:30 DG Chest Port 1 View CLINICAL DATA:  Weakness, hypoxia  EXAM: PORTABLE CHEST 1 VIEW  COMPARISON:  02/13/2020  FINDINGS: Lung volumes are small and there is mild elevation of the right hemidiaphragm with asymmetric right basilar atelectasis. No superimposed focal pulmonary infiltrate. No pneumothorax or pleural effusion. Cardiac size within normal limits. Pulmonary vascularity normal. No acute bone abnormality.  IMPRESSION: Pulmonary hypoinflation  Electronically Signed   By: AFidela SalisburyMD   On: 04/23/2020 02:19 Note: Reviewed        Physical Exam  General appearance: Well nourished, well developed, and well hydrated. In no apparent acute distress Mental status: Alert, oriented x 3 (person, place, & time)       Respiratory: No evidence of acute  respiratory distress Eyes: PERLA Vitals: BP 128/85 (BP Location: Right Arm, Patient Position: Sitting, Cuff Size: Large)   Pulse 83   Temp (!) 96.8 F (36 C) (Temporal)   Resp 16   Ht 5' 9"  (1.753 m)   Wt 195 lb (88.5 kg)   SpO2 98%   BMI 28.80 kg/m  BMI: Estimated body mass index is 28.8 kg/m as calculated from the following:   Height as of this encounter: 5' 9"  (1.753 m).   Weight as of this encounter: 195 lb (88.5 kg). Ideal: Ideal body weight: 70.7 kg (155 lb 13.8 oz) Adjusted ideal body weight: 77.8 kg (171 lb 8.3 oz)  Assessment   Status Diagnosis  Controlled Controlled Controlled 1. Chronic pain syndrome   2. Failed back surgical syndrome   3. 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 Beverely Pace at [facility] on 01/04/2013)   4. Chronic low back pain (Bilateral) (L>R) w/ sciatica (Left)   5. Chronic lower extremity pain (Left)   6. 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 Pharmacologic therapy   8. Chronic use of opiate for therapeutic purpose   9. Encounter for medication management      Updated Problems: No problems updated.  Plan of Care  Problem-specific:  No problem-specific Assessment & Plan notes found for this encounter.  Mr. ANTE ARREDONDO has a current medication list which includes the following long-term medication(s): cetirizine, cyclobenzaprine, esomeprazole, ezetimibe, gabapentin, humulin r u-500 kwikpen, magnesium oxide, metoprolol succinate, montelukast, niacin, [START ON 09/21/2020] oxycodone, [START ON 10/21/2020] oxycodone, and [START ON 11/20/2020] oxycodone.  Pharmacotherapy (Medications Ordered): Meds ordered this encounter  Medications   oxyCODONE (OXY IR/ROXICODONE) 5 MG immediate release tablet    Sig: Take 1 tablet (5 mg total) by mouth every 4 (four) hours as needed for severe pain. Must last 30 days.    Dispense:  180 tablet    Refill:  0    Not a duplicate. Do NOT delete! Dispense 1 day early if closed on refill date. Avoid benzodiazepines within 8 hours of opioids. Do not send refill requests.   oxyCODONE (OXY IR/ROXICODONE) 5 MG immediate release tablet    Sig: Take 1 tablet (5 mg total) by mouth every 4 (four) hours as needed for severe pain. Must last 30 days.    Dispense:  180 tablet    Refill:  0    Not a duplicate. Do NOT delete! Dispense 1 day early if closed on refill date. Avoid benzodiazepines within 8 hours of opioids. Do not send refill requests.   oxyCODONE (OXY IR/ROXICODONE) 5 MG immediate release tablet    Sig: Take 1 tablet (5 mg total) by mouth every 4 (four) hours as needed for severe pain. Must last 30 days.    Dispense:  180 tablet    Refill:  0    Not a duplicate. Do NOT delete! Dispense 1 day early if closed on refill date. Avoid benzodiazepines within 8 hours of opioids. Do not send refill requests.    Orders:  No orders of the  defined types were placed in this encounter.  Follow-up plan:   Return in about 13 weeks (around 12/20/2020) for (F2F-MM) E/M-day (M,W).     Considering:   NOTE: EFFIENT Anticoagulation (Stop: 7-10 days  Re-start: 6 hrs)    Palliative PRN treatment(s):   Palliative right PSIS MNB/TPI  Diagnostic/palliative right lumbar facet block #3  Diagnostic/palliative left lumbar facet block #2  Palliative left  lumbar facet RFA #2 (last done 12/03/2018)  Palliative right lumbar facet RFA #3 (last done 12/02/2019)     Recent Visits No visits were found meeting these conditions. Showing recent visits within past 90 days and meeting all other requirements Today's Visits Date Type Provider Dept  09/20/20 Office Visit Milinda Pointer, MD [facility]  Showing today's visits and meeting all other requirements Future Appointments No visits were found meeting these conditions. Showing future appointments within next 90 days and meeting all other requirements I discussed the assessment and treatment plan with the patient. The patient was provided an opportunity to ask questions and all were answered. The patient agreed with the plan and demonstrated an understanding of the instructions.  Patient advised to call back or seek an in-person evaluation if the symptoms or condition worsens.  Duration of encounter: 30 minutes.  Note by: Gaspar Cola, MD Date: 09/20/2020; Time: 11:03 AM

## 2020-09-20 ENCOUNTER — Ambulatory Visit: Payer: Medicare Other | Attending: Pain Medicine | Admitting: Pain Medicine

## 2020-09-20 ENCOUNTER — Other Ambulatory Visit: Payer: Self-pay

## 2020-09-20 ENCOUNTER — Encounter: Payer: Self-pay | Admitting: Pain Medicine

## 2020-09-20 VITALS — BP 128/85 | HR 83 | Temp 96.8°F | Resp 16 | Ht 69.0 in | Wt 195.0 lb

## 2020-09-20 DIAGNOSIS — D49512 Neoplasm of unspecified behavior of left kidney: Secondary | ICD-10-CM | POA: Insufficient documentation

## 2020-09-20 DIAGNOSIS — G8929 Other chronic pain: Secondary | ICD-10-CM | POA: Diagnosis present

## 2020-09-20 DIAGNOSIS — M47816 Spondylosis without myelopathy or radiculopathy, lumbar region: Secondary | ICD-10-CM | POA: Diagnosis not present

## 2020-09-20 DIAGNOSIS — G894 Chronic pain syndrome: Secondary | ICD-10-CM | POA: Insufficient documentation

## 2020-09-20 DIAGNOSIS — Z79891 Long term (current) use of opiate analgesic: Secondary | ICD-10-CM | POA: Insufficient documentation

## 2020-09-20 DIAGNOSIS — M79605 Pain in left leg: Secondary | ICD-10-CM | POA: Insufficient documentation

## 2020-09-20 DIAGNOSIS — M545 Low back pain, unspecified: Secondary | ICD-10-CM | POA: Insufficient documentation

## 2020-09-20 DIAGNOSIS — M961 Postlaminectomy syndrome, not elsewhere classified: Secondary | ICD-10-CM | POA: Insufficient documentation

## 2020-09-20 DIAGNOSIS — Z79899 Other long term (current) drug therapy: Secondary | ICD-10-CM | POA: Insufficient documentation

## 2020-09-20 MED ORDER — OXYCODONE HCL 5 MG PO TABS: 5.0000 mg | ORAL_TABLET | ORAL | 0 refills | Status: DC | PRN

## 2020-09-20 MED ORDER — OXYCODONE HCL 5 MG PO TABS
5.0000 mg | ORAL_TABLET | ORAL | 0 refills | Status: DC | PRN
Start: 2020-09-21 — End: 2020-12-17

## 2020-09-20 NOTE — Progress Notes (Signed)
Nursing Pain Medication Assessment:  Safety precautions to be maintained throughout the outpatient stay will include: orient to surroundings, keep bed in low position, maintain call bell within reach at all times, provide assistance with transfer out of bed and ambulation.  Medication Inspection Compliance: Kenneth Osborn did not comply with our request to bring his pills to be counted. He was reminded that bringing the medication bottles, even when empty, is a requirement.  Medication: None brought in. Pill/Patch Count: None available to be counted. Bottle Appearance: No container available. Did not bring bottle(s) to appointment. Filled Date: N/A Last Medication intake:  Today

## 2020-09-22 ENCOUNTER — Emergency Department: Payer: Medicare Other

## 2020-09-22 ENCOUNTER — Emergency Department
Admission: EM | Admit: 2020-09-22 | Discharge: 2020-09-22 | Disposition: A | Discharge status: Home / Self Care | Payer: Medicare Other | Attending: Student in an Organized Health Care Education/Training Program | Admitting: Student in an Organized Health Care Education/Training Program

## 2020-09-22 DIAGNOSIS — Z7982 Long term (current) use of aspirin: Secondary | ICD-10-CM | POA: Insufficient documentation

## 2020-09-22 DIAGNOSIS — E860 Dehydration: Secondary | ICD-10-CM | POA: Insufficient documentation

## 2020-09-22 DIAGNOSIS — E1140 Type 2 diabetes mellitus with diabetic neuropathy, unspecified: Secondary | ICD-10-CM | POA: Diagnosis not present

## 2020-09-22 DIAGNOSIS — Z85528 Personal history of other malignant neoplasm of kidney: Secondary | ICD-10-CM | POA: Diagnosis not present

## 2020-09-22 DIAGNOSIS — I2510 Atherosclerotic heart disease of native coronary artery without angina pectoris: Secondary | ICD-10-CM | POA: Insufficient documentation

## 2020-09-22 DIAGNOSIS — Z79899 Other long term (current) drug therapy: Secondary | ICD-10-CM | POA: Insufficient documentation

## 2020-09-22 DIAGNOSIS — R42 Dizziness and giddiness: Secondary | ICD-10-CM

## 2020-09-22 DIAGNOSIS — Z794 Long term (current) use of insulin: Secondary | ICD-10-CM | POA: Diagnosis not present

## 2020-09-22 DIAGNOSIS — Z7984 Long term (current) use of oral hypoglycemic drugs: Secondary | ICD-10-CM | POA: Diagnosis not present

## 2020-09-22 DIAGNOSIS — E1142 Type 2 diabetes mellitus with diabetic polyneuropathy: Secondary | ICD-10-CM | POA: Insufficient documentation

## 2020-09-22 DIAGNOSIS — I10 Essential (primary) hypertension: Secondary | ICD-10-CM | POA: Insufficient documentation

## 2020-09-22 LAB — CBC WITH DIFFERENTIAL/PLATELET
Abs Immature Granulocytes: 0.02 10*3/uL (ref 0.00–0.07)
Basophils Absolute: 0 10*3/uL (ref 0.0–0.1)
Basophils Relative: 1 %
Eosinophils Absolute: 0.2 10*3/uL (ref 0.0–0.5)
Eosinophils Relative: 2 %
HCT: 36.5 % — ABNORMAL LOW (ref 39.0–52.0)
Hemoglobin: 12 g/dL — ABNORMAL LOW (ref 13.0–17.0)
Immature Granulocytes: 0 %
Lymphocytes Relative: 17 %
Lymphs Abs: 1.4 10*3/uL (ref 0.7–4.0)
MCH: 26.7 pg (ref 26.0–34.0)
MCHC: 32.9 g/dL (ref 30.0–36.0)
MCV: 81.1 fL (ref 80.0–100.0)
Monocytes Absolute: 0.8 10*3/uL (ref 0.1–1.0)
Monocytes Relative: 9 %
Neutro Abs: 6 10*3/uL (ref 1.7–7.7)
Neutrophils Relative %: 71 %
Platelets: 136 10*3/uL — ABNORMAL LOW (ref 150–400)
RBC: 4.5 MIL/uL (ref 4.22–5.81)
RDW: 16.4 % — ABNORMAL HIGH (ref 11.5–15.5)
WBC: 8.4 10*3/uL (ref 4.0–10.5)
nRBC: 0 % (ref 0.0–0.2)

## 2020-09-22 LAB — TROPONIN I (HIGH SENSITIVITY)
Troponin I (High Sensitivity): 4 ng/L (ref <18)
Troponin I (High Sensitivity): 3 ng/L (ref <18)

## 2020-09-22 LAB — COMPREHENSIVE METABOLIC PANEL
ALT: 20 U/L (ref 0–44)
AST: 21 U/L (ref 15–41)
Albumin: 3.5 g/dL (ref 3.5–5.0)
Alkaline Phosphatase: 70 U/L (ref 38–126)
Anion gap: 7 (ref 5–15)
BUN: 36 mg/dL — ABNORMAL HIGH (ref 8–23)
CO2: 24 mmol/L (ref 22–32)
Calcium: 8.8 mg/dL — ABNORMAL LOW (ref 8.9–10.3)
Chloride: 110 mmol/L (ref 98–111)
Creatinine, Ser: 1.72 mg/dL — ABNORMAL HIGH (ref 0.61–1.24)
GFR, Estimated: 44 mL/min — ABNORMAL LOW (ref >60)
Glucose, Bld: 106 mg/dL — ABNORMAL HIGH (ref 70–99)
Potassium: 5.3 mmol/L — ABNORMAL HIGH (ref 3.5–5.1)
Sodium: 141 mmol/L (ref 135–145)
Total Bilirubin: 0.6 mg/dL (ref 0.3–1.2)
Total Protein: 6 g/dL — ABNORMAL LOW (ref 6.5–8.1)

## 2020-09-22 MED ORDER — LACTATED RINGERS IV BOLUS
1000.0000 mL | Freq: Once | INTRAVENOUS | Status: AC
  Administered 2020-09-22: 1000 mL via INTRAVENOUS

## 2020-09-22 NOTE — ED Triage Notes (Signed)
Blood glucose 139, at [facility] dizzy, altered, speech problems

## 2020-09-22 NOTE — ED Provider Notes (Signed)
Patient received in signout from Dr. Quentin Cornwall pending remainder of blood work and reassessment.  Patient has finished IV fluids and has had a benign work-up.  He reports feeling better now and is requesting discharge.  We discussed following up with his PCP for potassium recheck and return precautions for the ED were discussed prior to discharge.  Clinical Course as of 09/22/20 1846  Fri Sep 22, 2020  1521 Does appear mildly dehydrated on blood work.  Will give IV fluids.  He is somewhat drowsy but with no focal neurodeficits do suspect that is related to polypharmacy and states that that sometimes happens when he gets dehydrated.  Patient be signed out to oncoming physician pending reassessment after IV fluids and blood work [PR]  1844 Reassessed.  Patient reports feeling much better.  We discussed largely benign work-up and we discussed return precautions for the ED.  Discussed PCP follow-up for potassium recheck. [DS]    Clinical Course User Index [DS] Vladimir Crofts, MD [PR] Merlyn Lot, MD      Vladimir Crofts, MD 09/22/20 [phone]

## 2020-09-22 NOTE — ED Provider Notes (Signed)
[facility] Emergency Department Provider Note    Event Date/Time   First MD Initiated Contact with Patient 09/22/20 1350     (approximate)  I have reviewed the triage vital signs and the nursing notes.   HISTORY  Chief Complaint Dizziness ([employer] sizzy, altered )    HPI Kenneth Osborn is a 65 y.o. male with extensive past medical history as listed below presents to the ER for evaluation of dizziness confusion lightheadedness that occurred while he was at [facility].  Reported was a bit confused on EMS arrival thinking that he was in [facility].  States he did have brief episode of chest discomfort while at [facility] but denies any shortness of breath no nausea or vomiting.  Blood sugar was normal.  Is on chronic pain medication did not deviate from his prescribed doses today.  Denies any numbness or tingling.  Past Medical History:  Diagnosis Date   Acute postoperative pain 12/03/2018   Allergic rhinitis 12/30/2012   Anginal pain (HCC)    Anxiety    Bronchitis    hx of   Can't get food down 08/12/2014   Cancer ([place]) 01/2020   kidney    Chronic back pain    thoracic area   Concussion 09/2015   Coronary artery disease    99% blockage   DDD (degenerative disc disease), cervical    Dehydration symptoms    2019   Diabetes mellitus without complication (HCC)    insulin dependent   Dysphagia    GERD (gastroesophageal reflux disease)    History of Meniere's disease 12/21/2014   History of thoracic spine surgery (S/P T9-10 IVD spacer) 12/21/2014   Hypercholesteremia    Hyperlipidemia    Hypertension    sees Dr. 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 Drema [place], [facility]   Neuromuscular disorder [facility])    diabetic neuropathy in feet   Severe sepsis ([place]) 02/07/2020   Short-segment Barrett's esophagus    Sleep apnea    mild   Family History  Problem  Relation Age of Onset   Heart disease Mother    Diabetes Mother    Heart disease Father    Cancer Sister    Heart disease Maternal Aunt    Heart disease Maternal Uncle    Diabetes Maternal Grandmother    Diabetes Paternal Grandmother    Past Surgical History:  Procedure Laterality Date   APPENDECTOMY     age 46   ARTHRODESIS ANTERIOR ANTERIOR CERVICLE SPINE  01/04/2013   BACK SURGERY     fusion thoracic area   CARDIAC CATHETERIZATION     may 2012 and Nov 20, 2010   CARDIAC CATHETERIZATION N/A 06/15/2015   Procedure: Left Heart Cath and Coronary Angiography;  Surgeon: Corey Skains, MD;  Location: [place] CV LAB;  Service: Cardiovascular;  Laterality: N/A;   CARDIAC CATHETERIZATION N/A 06/15/2015   Procedure: Coronary Stent Intervention;  Surgeon: Isaias Cowman, MD;  Location: [place] [facility];  Service: Cardiovascular;  Laterality: N/A;   CARDIOVASCULAR STRESS TEST     jan 2014   COLONOSCOPY WITH PROPOFOL N/A 09/19/2014   Procedure: COLONOSCOPY WITH PROPOFOL;  Surgeon: Manya Silvas, MD;  Location: [facility] ENDOSCOPY;  Service: Endoscopy;  Laterality: N/A;   CORONARY ANGIOPLASTY     stent placement x3   ESOPHAGOGASTRODUODENOSCOPY N/A 09/19/2014   Procedure: ESOPHAGOGASTRODUODENOSCOPY (EGD);  Surgeon:  Manya Silvas, MD;  Location: [facility] ENDOSCOPY;  Service: Endoscopy;  Laterality: N/A;   IR PERC CHOLECYSTOSTOMY  02/09/2020   IR RADIOLOGIST EVAL & MGMT  03/14/2020   LABRINTHECTOMY     1999 right ear   LUMBAR SPINAL CORD SIMULATOR LEAD REMOVAL Right 08/09/2019   Procedure: REMOVAL SPINAL CORD STIMULATOR PERCUTANEOUS LEADS, REMOVAL PULSE GENERATOR;  Surgeon: [name], MD;  Location: [facility] ORS;  Service: Neurosurgery;  Laterality: Right;  LOCAL WITH MAC   mastoid shunt Bilateral    left, 2002, 1997 right ear, 1980 right ear   PULSE GENERATOR IMPLANT N/A 01/18/2019   Procedure: [employer] SPINAL CORD STIMULATOR BATTERY EXCHANGE;  Surgeon: [name], MD;  Location:  [facility] ORS;  Service: Neurosurgery;  Laterality: N/A;   ROBOT ASSISTED LAPAROSCOPIC NEPHRECTOMY Left 04/20/2020   Procedure: XI ROBOTIC ASSITED LAPAROSCOPIC NEPHRECTOMY;  Surgeon: Raynelle Bring, MD;  Location: [facility] ORS;  Service: Urology;  Laterality: Left;   SAVORY DILATION N/A 09/19/2014   Procedure: SAVORY DILATION;  Surgeon: Manya Silvas, MD;  Location: [facility] ENDOSCOPY;  Service: Endoscopy;  Laterality: N/A;   SHOULDER ARTHROSCOPY WITH SUBACROMIAL DECOMPRESSION Left 04/06/2012   Procedure: SHOULDER ARTHROSCOPY WITH SUBACROMIAL DECOMPRESSION;  Surgeon: Vickey Huger, MD;  Location: [place];  Service: Orthopedics;  Laterality: Left;  left shoulder arthroscopy, subacromial decompression and distal clavicle resection   SPINAL CORD STIMULATOR IMPLANT Right    Patient Active Problem List   Diagnosis Date Noted   Chronic use of opiate for therapeutic purpose 02/58/5277   Uncomplicated opioid dependence ([place]) 05/23/2020   Constipation 04/24/2020   Low grade fever 04/24/2020   Hypoxia    Acute respiratory failure with hypoxia ([place]) 04/23/2020   Status post nephrectomy    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 04/20/2020   Acute on chronic cholecystitis s/p robotic cholecystectomy 04/20/2020 04/20/2020   Elevated LFTs    Renal mass    Hypokalemia    Chronic low back pain (Bilateral) w/o sciatica 12/16/2019   Hyperglycemia 12/16/2019   Pharmacologic therapy 08/03/2019   Disorder of skeletal system 08/03/2019   Problems influencing health status 08/03/2019   Postop check 12/17/2018   Acute postoperative pain 12/03/2018   DDD (degenerative disc disease), lumbosacral 09/21/2018   Diabetic polyneuropathy associated with diabetes mellitus due to underlying condition ([place]) 09/10/2018   Neuropathic pain 09/10/2018   Musculoskeletal pain 09/10/2018   AKI (acute kidney injury) ([place]) 02/19/2018   Frequent falls 02/07/2018   Hyperkalemia 01/18/2018   Spondylosis  without myelopathy or radiculopathy, lumbosacral region 11/13/2017   Cardiac syncope 09/08/2017   History of coronary artery disease 09/03/2017   Spinal cord stimulator dysfunction ([place]) 12/26/2016   Trigger point posterior superior iliac spine (PSIS) (Right) 12/25/2016   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) 11/12/2016   Type 2 diabetes mellitus with diabetic polyneuropathy, with long-term current use of insulin ([place]) 11/12/2016   Tremor 09/11/2016   Chronic pain syndrome 05/28/2016   Bilateral carotid artery stenosis 01/24/2016   Mild cognitive impairment 11/27/2015   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) 11/27/2015   Balance problem 10/09/2015   Chronic tension-type headache, not intractable 10/09/2015   Dizziness 10/09/2015   Post-concussion headache 10/09/2015   PSVT (paroxysmal supraventricular tachycardia) ([place]) 08/31/2015  Diabetes mellitus, type II ([place]) 06/14/2015   Unstable angina ([place]) 06/08/2015   Encounter for interrogation of neurostimulator 03/22/2015   [employer] spinal cord stimulator (implant date: 11/01/2014) 03/22/2015   Type 2 diabetes mellitus with hyperglycemia ([place]) 03/15/2015   Uncontrolled type 2 diabetes mellitus with hyperglycemia, with long-term current use of insulin ([place]) 03/15/2015   DM type 2 with diabetic peripheral neuropathy ([place]) 03/15/2015   Acid reflux 12/21/2014   OSA (obstructive sleep apnea) 12/21/2014   S/P insertion of spinal cord stimulator 12/21/2014   Chronic low back pain (Bilateral) (L>R) w/ sciatica (Left) 12/21/2014   Failed cervical surgery syndrome (C5-6 ACDF by Dr. Beverely Pace at [facility] on 01/04/2013) 12/21/2014   Long term current use  of opiate analgesic 12/21/2014   Long term prescription opiate use 12/21/2014   Opiate use (60 MME/Day) 12/21/2014   Opiate dependence ([place]) 12/21/2014   Encounter for therapeutic drug level monitoring 12/21/2014   Neurogenic pain 12/21/2014   Thoracic facet syndrome (T8-10) 12/21/2014   Lumbar facet syndrome (Bilateral) (R>L) 12/21/2014   Cervical facet syndrome (Right) 12/21/2014   Cervical spondylosis 12/21/2014   Lumbar spondylosis 12/21/2014   Chronic upper extremity pain (Left) 12/21/2014   Chronic cervical radicular pain (Left) 12/21/2014   Chronic upper back pain 12/21/2014   History of thoracic spine surgery (S/P T9-10 IVD spacer) 12/21/2014   Failed back surgical syndrome 12/21/2014   Chronic musculoskeletal pain 12/21/2014   Myofascial pain 12/21/2014   Chronic lower extremity pain (Left) 12/21/2014   Chronic radicular lumbar pain (left L4 dermatomal pain) 12/21/2014   Coronary artery disease 12/21/2014   History of MI (myocardial infarction) (May 2012) 12/21/2014   Chronic anticoagulation ([place]) 12/21/2014   History of Meniere's disease 12/21/2014   Sleep apnea 12/21/2014   History of spinal surgery 11/14/2014   Hypotension due to hypovolemia 09/22/2014   Benign essential HTN 09/14/2014   Hypertension associated with diabetes ([place]) 09/14/2014   Barrett esophagus 08/12/2014   H/O adenomatous polyp of colon 08/12/2014   Dysphagia 08/12/2014   GERD (gastroesophageal reflux disease) 01/25/2014   Arteriosclerosis of coronary artery 08/13/2013   Hyperlipidemia 08/13/2013   CAD (coronary artery disease) 08/13/2013   Hyperlipidemia associated with type 2 diabetes mellitus ([place]) 08/13/2013   Auditory vertigo 12/30/2012   Cervical spinal stenosis 12/11/2012   Arthralgia of shoulder 03/16/2012      Prior to Admission medications   Medication Sig Start Date End Date Taking? Authorizing Provider  ALPRAZolam Duanne Moron) 0.5 MG tablet Take 1 tablet (0.5 mg total) by mouth at  bedtime. 04/26/20   Little Ishikawa, MD  aspirin EC 81 MG tablet Take 81 mg by mouth at bedtime. Swallow whole.    [provider]  cetirizine (ZYRTEC) 10 MG tablet Take 10 mg by mouth daily.     [provider]  cyclobenzaprine (FLEXERIL) 10 MG tablet Take 1 tablet (10 mg total) by mouth 3 (three) times daily as needed for muscle spasms. 04/26/20   Little Ishikawa, MD  diazepam (VALIUM) 5 MG tablet Take 1 tablet (5 mg total) by mouth See admin instructions. Take 5 mg in the morning and additional if needed for Meniere's disease up to three a day 04/26/20   Little Ishikawa, MD  diltiazem (CARDIZEM CD) 240 MG 24 hr capsule Take 240 mg by mouth daily. 03/30/18   [provider]  enalapril (VASOTEC) 10 MG tablet Take 10 mg by mouth daily. 09/07/20   [provider]  esomeprazole ([place]) 40 MG  capsule Take 40 mg by mouth 2 (two) times daily.    [provider]  ezetimibe (ZETIA) 10 MG tablet Take 10 mg by mouth daily. 03/26/17   [provider]  gabapentin (NEURONTIN) 800 MG tablet Take 800 mg by mouth 2 (two) times daily.    [provider]  Glucagon (BAQSIMI ONE PACK NA) Place 1 Dose into the nose daily as needed (severely low blood sugar).    [provider]  HUMULIN R U-500 KWIKPEN 500 UNIT/ML kwikpen Inject 40 Units into the skin 3 (three) times daily with meals. Patient taking differently: Inject 65-75 Units into the skin See admin instructions. Sliding scale  Can adjust according to blood sugar Inject 75 units with breakfast, 75 units with lunch, 65 units with dinner 01/19/18   Max Sane, MD  Magnesium Oxide 500 MG TABS Take 500 mg by mouth daily.    [provider]  meclizine (ANTIVERT) 25 MG tablet Take 25 mg by mouth See admin instructions. Take 25 mg in the morning, and additional 25 mg if needed up to a total of three per day for Meniere's disease 07/06/15   [provider]  metFORMIN  (GLUCOPHAGE) 1000 MG tablet Take 1,000 mg by mouth 2 (two) times daily. 07/25/20   [provider]  metoprolol succinate (TOPROL-XL) 100 MG 24 hr tablet Take 100 mg by mouth 2 (two) times daily. Take with or immediately following a meal.    [provider]  montelukast (SINGULAIR) 10 MG tablet Take 10 mg by mouth at bedtime.     [provider]  niacin (NIASPAN) 500 MG CR tablet Take 500 mg by mouth at bedtime.    [provider]  nitroGLYCERIN (NITROSTAT) 0.4 MG SL tablet Place 0.4 mg under the tongue as needed. 08/03/20   [provider]  oxyCODONE (OXY IR/ROXICODONE) 5 MG immediate release tablet Take 1 tablet (5 mg total) by mouth every 4 (four) hours as needed for severe pain. Must last 30 days. 09/21/20 10/21/20  Milinda Pointer, MD  oxyCODONE (OXY IR/ROXICODONE) 5 MG immediate release tablet Take 1 tablet (5 mg total) by mouth every 4 (four) hours as needed for severe pain. Must last 30 days. 10/21/20 11/20/20  Milinda Pointer, MD  oxyCODONE (OXY IR/ROXICODONE) 5 MG immediate release tablet Take 1 tablet (5 mg total) by mouth every 4 (four) hours as needed for severe pain. Must last 30 days. 11/20/20 12/20/20  Milinda Pointer, MD  polyethylene glycol (MIRALAX / GLYCOLAX) 17 g packet Take 17 g by mouth 2 (two) times daily. 04/26/20   Little Ishikawa, MD  prasugrel (EFFIENT) 10 MG TABS tablet Take 10 mg by mouth daily. 11/22/19   [provider]  rosuvastatin (CRESTOR) 40 MG tablet Take 40 mg by mouth at bedtime.    [provider]  senna-docusate (SENOKOT-S) 8.6-50 MG tablet Take 1 tablet by mouth 2 (two) times daily. 04/26/20   Little Ishikawa, MD    Allergies Patient has no known allergies.    Social History Social History   Tobacco Use   Smoking status: Never   Smokeless tobacco: Never  Vaping Use   Vaping Use: Never used  Substance Use Topics   Alcohol use: No   Drug use: No    Review of Systems Patient denies  headaches, rhinorrhea, blurry vision, numbness, shortness of breath, chest pain, edema, cough, abdominal pain, nausea, vomiting, diarrhea, dysuria, fevers, rashes or hallucinations unless otherwise stated above in HPI. ____________________________________________  PHYSICAL EXAM:  VITAL SIGNS: Vitals:   09/22/20 1351 09/22/20 1409  BP: 128/77 (!) 99/49  Pulse: 89   Resp: 17   Temp: 98.2 F (36.8 C)   SpO2: 98%     Constitutional: Alert and oriented.  Eyes: Conjunctivae are normal.  Head: Atraumatic. Nose: No congestion/rhinnorhea. Mouth/Throat: Mucous membranes are dry.   Neck: No stridor. Painless ROM.  Cardiovascular: Normal rate, regular rhythm. Grossly normal heart sounds.  Good peripheral circulation. Respiratory: Normal respiratory effort.  No retractions. Lungs CTAB. Gastrointestinal: Soft and nontender. No distention. No abdominal bruits. No CVA tenderness. Genitourinary:  Musculoskeletal: No lower extremity tenderness nor edema.  No joint effusions. Neurologic:  Normal speech and language. No gross focal neurologic deficits are appreciated. No facial droop Skin:  Skin is warm, dry and intact. No rash noted. Psychiatric: Mood and affect are normal. Speech and behavior are normal.  ____________________________________________   LABS (all labs ordered are listed, but only abnormal results are displayed)  Results for orders placed or performed during the hospital encounter of 09/22/20 (from the past 24 hour(s))  CBC with Differential/Platelet     Status: Abnormal   Collection Time: 09/22/20  1:48 PM  Result Value Ref Range   WBC 8.4 4.0 - 10.5 K/uL   RBC 4.50 4.22 - 5.81 MIL/uL   Hemoglobin 12.0 (L) 13.0 - 17.0 g/dL   HCT 36.5 (L) 39.0 - 52.0 %   MCV 81.1 80.0 - 100.0 fL   MCH 26.7 26.0 - 34.0 pg   MCHC 32.9 30.0 - 36.0 g/dL   RDW 16.4 (H) 11.5 - 15.5 %   Platelets 136 (L) 150 - 400 K/uL   nRBC 0.0 0.0 - 0.2 %   Neutrophils Relative % 71 %   Neutro Abs 6.0 1.7  - 7.7 K/uL   Lymphocytes Relative 17 %   Lymphs Abs 1.4 0.7 - 4.0 K/uL   Monocytes Relative 9 %   Monocytes Absolute 0.8 0.1 - 1.0 K/uL   Eosinophils Relative 2 %   Eosinophils Absolute 0.2 0.0 - 0.5 K/uL   Basophils Relative 1 %   Basophils Absolute 0.0 0.0 - 0.1 K/uL   Immature Granulocytes 0 %   Abs Immature Granulocytes 0.02 0.00 - 0.07 K/uL  Comprehensive metabolic panel     Status: Abnormal   Collection Time: 09/22/20  1:48 PM  Result Value Ref Range   Sodium 141 135 - 145 mmol/L   Potassium 5.3 (H) 3.5 - 5.1 mmol/L   Chloride 110 98 - 111 mmol/L   CO2 24 22 - 32 mmol/L   Glucose, Bld 106 (H) 70 - 99 mg/dL   BUN 36 (H) 8 - 23 mg/dL   Creatinine, Ser 1.72 (H) 0.61 - 1.24 mg/dL   Calcium 8.8 (L) 8.9 - 10.3 mg/dL   Total Protein 6.0 (L) 6.5 - 8.1 g/dL   Albumin 3.5 3.5 - 5.0 g/dL   AST 21 15 - 41 U/L   ALT 20 0 - 44 U/L   Alkaline Phosphatase 70 38 - 126 U/L   Total Bilirubin 0.6 0.3 - 1.2 mg/dL   GFR, Estimated 44 (L) >60 mL/min   Anion gap 7 5 - 15  Troponin I (High Sensitivity)     Status: None   Collection Time: 09/22/20  1:48 PM  Result Value Ref Range   Troponin I (High Sensitivity) 4 <18 ng/L   ____________________________________________  EKG My review and personal interpretation at Time: 13:55   Indication: dizziness  Rate:  90  Rhythm: sinus Axis: normal Other: normal intervals, no stemi ____________________________________________  RADIOLOGY  I personally reviewed all radiographic images ordered to evaluate for the above acute complaints and reviewed radiology reports and findings.  These findings were personally discussed with the patient.  Please see medical record for radiology report.  ____________________________________________   PROCEDURES  Procedure(s) performed:  Procedures    Critical Care performed: no ____________________________________________   INITIAL IMPRESSION / ASSESSMENT AND PLAN / ED COURSE  Pertinent labs & imaging  results that were available during my care of the patient were reviewed by me and considered in my medical decision making (see chart for details).   DDX: Hydration, electrolyte abnormality, ACS, CVA, medication effect, TIA  YONAS BUNDA is a 65 y.o. who presents to the ED with presentation as described above.  Patient without focal deficits.  Blood pressure is soft afebrile does appear clinically dry suspect dehydration.  Will give IV fluids.  Also on multiple sedating [place] medications may have a component of polypharmacy.  Lower suspicion for CVA or TIA.  CT imaging ordered.  Given chest pain will order EKG as well as serial enzymes but have a lower suspicion for ACS as he is currently chest pain-free.  Clinical Course as of 09/22/20 1525  Fri Sep 22, 2020  1521 Does appear mildly dehydrated on blood work.  Will give IV fluids.  He is somewhat drowsy but with no focal neurodeficits do suspect that is related to polypharmacy and states that that sometimes happens when he gets dehydrated.  Patient be signed out to oncoming physician pending reassessment after IV fluids and blood work [PR]    Clinical Course User Index [PR] Merlyn Lot, MD    The patient was evaluated in Emergency Department today for the symptoms described in the history of present illness. He/she was evaluated in the context of the global COVID-19 pandemic, which necessitated consideration that the patient might be at risk for infection with the SARS-CoV-2 virus that causes COVID-19. Institutional protocols and algorithms that pertain to the evaluation of patients at risk for COVID-19 are in a state of rapid change based on information released by regulatory bodies including the CDC and federal and state organizations. These policies and algorithms were followed during the patient's care in the ED.  As part of my medical decision making, I reviewed the following data within the [place] notes  reviewed and incorporated, Labs reviewed, notes from prior ED visits and [place] Controlled Substance Database   ____________________________________________   FINAL CLINICAL IMPRESSION(S) / ED DIAGNOSES  Final diagnoses:  Dizziness  Dehydration      NEW MEDICATIONS STARTED DURING THIS VISIT:  New Prescriptions   No medications on file     Note:  This document was prepared using Dragon voice recognition software and may include unintentional dictation errors.    Merlyn Lot, MD 09/22/20 1525

## 2020-11-05 ENCOUNTER — Emergency Department (HOSPITAL_COMMUNITY)
Admission: EM | Admit: 2020-11-05 | Discharge: 2020-11-06 | Disposition: A | Discharge status: Home / Self Care | Payer: Medicare Other | Attending: Emergency Medicine | Admitting: Emergency Medicine

## 2020-11-05 ENCOUNTER — Emergency Department (HOSPITAL_COMMUNITY): Payer: Medicare Other

## 2020-11-05 ENCOUNTER — Encounter (HOSPITAL_COMMUNITY): Payer: Self-pay | Admitting: Emergency Medicine

## 2020-11-05 DIAGNOSIS — Z5321 Procedure and treatment not carried out due to patient leaving prior to being seen by health care provider: Secondary | ICD-10-CM | POA: Diagnosis not present

## 2020-11-05 DIAGNOSIS — M25571 Pain in right ankle and joints of right foot: Secondary | ICD-10-CM | POA: Insufficient documentation

## 2020-11-05 NOTE — ED Triage Notes (Signed)
PT c/o pain and swelling to R ankle x 1 week. Old injury to same ankle. Denies new injury.

## 2020-11-09 ENCOUNTER — Other Ambulatory Visit: Payer: Self-pay

## 2020-11-09 ENCOUNTER — Encounter: Payer: Self-pay | Admitting: *Deleted

## 2020-11-09 DIAGNOSIS — Z955 Presence of coronary angioplasty implant and graft: Secondary | ICD-10-CM | POA: Insufficient documentation

## 2020-11-09 DIAGNOSIS — Z794 Long term (current) use of insulin: Secondary | ICD-10-CM | POA: Insufficient documentation

## 2020-11-09 DIAGNOSIS — R197 Diarrhea, unspecified: Secondary | ICD-10-CM | POA: Insufficient documentation

## 2020-11-09 DIAGNOSIS — I2510 Atherosclerotic heart disease of native coronary artery without angina pectoris: Secondary | ICD-10-CM | POA: Diagnosis not present

## 2020-11-09 DIAGNOSIS — E875 Hyperkalemia: Secondary | ICD-10-CM | POA: Insufficient documentation

## 2020-11-09 DIAGNOSIS — Z7984 Long term (current) use of oral hypoglycemic drugs: Secondary | ICD-10-CM | POA: Insufficient documentation

## 2020-11-09 DIAGNOSIS — N179 Acute kidney failure, unspecified: Secondary | ICD-10-CM | POA: Diagnosis not present

## 2020-11-09 DIAGNOSIS — Z79899 Other long term (current) drug therapy: Secondary | ICD-10-CM | POA: Insufficient documentation

## 2020-11-09 DIAGNOSIS — K219 Gastro-esophageal reflux disease without esophagitis: Secondary | ICD-10-CM | POA: Insufficient documentation

## 2020-11-09 DIAGNOSIS — R799 Abnormal finding of blood chemistry, unspecified: Secondary | ICD-10-CM | POA: Diagnosis present

## 2020-11-09 DIAGNOSIS — Z85528 Personal history of other malignant neoplasm of kidney: Secondary | ICD-10-CM | POA: Diagnosis not present

## 2020-11-09 DIAGNOSIS — R109 Unspecified abdominal pain: Secondary | ICD-10-CM | POA: Insufficient documentation

## 2020-11-09 DIAGNOSIS — I10 Essential (primary) hypertension: Secondary | ICD-10-CM | POA: Insufficient documentation

## 2020-11-09 DIAGNOSIS — Z7982 Long term (current) use of aspirin: Secondary | ICD-10-CM | POA: Diagnosis not present

## 2020-11-09 DIAGNOSIS — E1142 Type 2 diabetes mellitus with diabetic polyneuropathy: Secondary | ICD-10-CM | POA: Insufficient documentation

## 2020-11-09 DIAGNOSIS — R300 Dysuria: Secondary | ICD-10-CM | POA: Diagnosis not present

## 2020-11-09 DIAGNOSIS — E1140 Type 2 diabetes mellitus with diabetic neuropathy, unspecified: Secondary | ICD-10-CM | POA: Diagnosis not present

## 2020-11-09 LAB — BASIC METABOLIC PANEL
Anion gap: 7 (ref 5–15)
BUN: 38 mg/dL — ABNORMAL HIGH (ref 8–23)
CO2: 26 mmol/L (ref 22–32)
Calcium: 9.6 mg/dL (ref 8.9–10.3)
Chloride: 107 mmol/L (ref 98–111)
Creatinine, Ser: 2.83 mg/dL — ABNORMAL HIGH (ref 0.61–1.24)
GFR, Estimated: 24 mL/min — ABNORMAL LOW (ref >60)
Glucose, Bld: 165 mg/dL — ABNORMAL HIGH (ref 70–99)
Potassium: 5.8 mmol/L — ABNORMAL HIGH (ref 3.5–5.1)
Sodium: 140 mmol/L (ref 135–145)

## 2020-11-09 NOTE — ED Triage Notes (Signed)
Pt reporting his doctor called him with routine lab work results indicating his potassium levels were high and he needs to be rechecked

## 2020-11-10 ENCOUNTER — Emergency Department
Admission: EM | Admit: 2020-11-10 | Discharge: 2020-11-10 | Disposition: A | Discharge status: Home / Self Care | Payer: Medicare Other | Attending: Emergency Medicine | Admitting: Emergency Medicine

## 2020-11-10 ENCOUNTER — Emergency Department: Payer: Medicare Other

## 2020-11-10 DIAGNOSIS — E875 Hyperkalemia: Secondary | ICD-10-CM | POA: Diagnosis not present

## 2020-11-10 DIAGNOSIS — N179 Acute kidney failure, unspecified: Secondary | ICD-10-CM

## 2020-11-10 LAB — PHOSPHORUS: Phosphorus: 3.1 mg/dL (ref 2.5–4.6)

## 2020-11-10 LAB — BASIC METABOLIC PANEL
Anion gap: 5 (ref 5–15)
BUN: 37 mg/dL — ABNORMAL HIGH (ref 8–23)
CO2: 22 mmol/L (ref 22–32)
Calcium: 8.8 mg/dL — ABNORMAL LOW (ref 8.9–10.3)
Chloride: 112 mmol/L — ABNORMAL HIGH (ref 98–111)
Creatinine, Ser: 2.14 mg/dL — ABNORMAL HIGH (ref 0.61–1.24)
GFR, Estimated: 33 mL/min — ABNORMAL LOW (ref >60)
Glucose, Bld: 159 mg/dL — ABNORMAL HIGH (ref 70–99)
Potassium: 4.4 mmol/L (ref 3.5–5.1)
Sodium: 139 mmol/L (ref 135–145)

## 2020-11-10 LAB — URINALYSIS, COMPLETE (UACMP) WITH MICROSCOPIC
Bacteria, UA: NONE SEEN
Bilirubin Urine: NEGATIVE
Glucose, UA: NEGATIVE mg/dL
Hgb urine dipstick: NEGATIVE
Ketones, ur: 5 mg/dL — AB
Leukocytes,Ua: NEGATIVE
Nitrite: NEGATIVE
Protein, ur: 30 mg/dL — AB
Specific Gravity, Urine: 1.024 (ref 1.005–1.030)
Squamous Epithelial / HPF: NONE SEEN (ref 0–5)
pH: 5 (ref 5.0–8.0)

## 2020-11-10 LAB — MAGNESIUM: Magnesium: 1.4 mg/dL — ABNORMAL LOW (ref 1.7–2.4)

## 2020-11-10 MED ORDER — LACTATED RINGERS IV BOLUS
1000.0000 mL | Freq: Once | INTRAVENOUS | Status: AC
  Administered 2020-11-10: 1000 mL via INTRAVENOUS

## 2020-11-10 MED ORDER — SODIUM ZIRCONIUM CYCLOSILICATE 10 G PO PACK
10.0000 g | PACK | ORAL | Status: AC
  Administered 2020-11-10: 10 g via ORAL
  Filled 2020-11-10: qty 1

## 2020-11-10 NOTE — ED Provider Notes (Signed)
[facility] Emergency Department Provider Note  ____________________________________________   Event Date/Time   First MD Initiated Contact with Patient 11/10/20 0006     (approximate)  I have reviewed the triage vital signs and the nursing notes.   HISTORY  Chief Complaint abnormal labs    HPI Kenneth Osborn is a 65 y.o. male with medical history as listed below which notably includes a left nephrectomy due to hepatocellular carcinoma about 6 months ago.  He presents tonight after having abnormal labs by his primary care doctor.  He had a normal visit but was called because his potassium was elevated.  He said that he has been essentially in a normal state of health.  He had some diarrhea last week but it has improved substantially.  He had 1 episode of vomiting last week as well but has not vomited since then.  He said that he feels a bit constipated because he took a bunch of Imodium after his diarrhea.  He said he has been eating and drinking normally and does not feel dehydrated.  He has had no lightheadedness, dizziness, nor episodes of almost passing out.  He reports having some intermittent right flank pain, urinary hesitancy and spite of urgency, and a bit of dysuria when he urinates, but that has been going on for only a couple of days and he did not think too much about it.  He denies fever, sore throat, chest pain, shortness of breath.  Reportedly the hyperkalemia was severe and relatively acute in onset.      Past Medical History:  Diagnosis Date   Acute postoperative pain 12/03/2018   Allergic rhinitis 12/30/2012   Anginal pain ([place])    Anxiety    Bronchitis    hx of   Can't get food down 08/12/2014   Cancer ([place]) 01/2020   kidney    Chronic back pain    thoracic area   Concussion 09/2015   Coronary artery disease    99% blockage   DDD (degenerative disc disease), cervical    Dehydration symptoms    2019   Diabetes mellitus  without complication (HCC)    insulin dependent   Dysphagia    GERD (gastroesophageal reflux disease)    History of Meniere's disease 12/21/2014   History of thoracic spine surgery (S/P T9-10 IVD spacer) 12/21/2014   Hypercholesteremia    Hyperlipidemia    Hypertension    sees Dr. 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 Drema Dallas, [facility]   Neuromuscular disorder [facility])    diabetic neuropathy in feet   Severe sepsis ([place]) 02/07/2020   Short-segment Barrett's esophagus    Sleep apnea    mild    Patient Active Problem List   Diagnosis Date Noted   Chronic use of opiate for therapeutic purpose 84/13/2440   Uncomplicated opioid dependence ([place]) 05/23/2020   Constipation 04/24/2020   Low grade fever 04/24/2020   Hypoxia    Acute respiratory failure with hypoxia ([place]) 04/23/2020   Status post nephrectomy    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 04/20/2020   Acute on chronic cholecystitis s/p robotic cholecystectomy 04/20/2020 04/20/2020   Elevated LFTs    Renal mass    Hypokalemia    Chronic low back pain (Bilateral) w/o sciatica 12/16/2019   Hyperglycemia 12/16/2019   Pharmacologic therapy 08/03/2019   Disorder of skeletal  system 08/03/2019   Problems influencing health status 08/03/2019   Postop check 12/17/2018   Acute postoperative pain 12/03/2018   DDD (degenerative disc disease), lumbosacral 09/21/2018   Diabetic polyneuropathy associated with diabetes mellitus due to underlying condition ([place]) 09/10/2018   Neuropathic pain 09/10/2018   Musculoskeletal pain 09/10/2018   AKI (acute kidney injury) ([place]) 02/19/2018   Frequent falls 02/07/2018   Hyperkalemia 01/18/2018   Spondylosis without myelopathy or radiculopathy, lumbosacral region 11/13/2017   Cardiac syncope 09/08/2017   History of coronary artery disease 09/03/2017   Spinal cord  stimulator dysfunction ([place]) 12/26/2016   Trigger point posterior superior iliac spine (PSIS) (Right) 12/25/2016   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) 12/25/2016   Polyneuropathy 12/18/2016   Numbness and tingling 11/12/2016   Tachycardia with heart rate 100-120 beats per minute 11/12/2016   Meniere disease, bilateral 11/12/2016   Headache disorder 11/12/2016   Type 2 diabetes mellitus with diabetic neuropathy ([place]) 11/12/2016   Type 2 diabetes mellitus with diabetic polyneuropathy, with long-term current use of insulin ([place]) 11/12/2016   Tremor 09/11/2016   Chronic pain syndrome 05/28/2016   Bilateral carotid artery stenosis 01/24/2016   Mild cognitive impairment 11/27/2015   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) 11/27/2015   Balance problem 10/09/2015   Chronic tension-type headache, not intractable 10/09/2015   Dizziness 10/09/2015   Post-concussion headache 10/09/2015   PSVT (paroxysmal supraventricular tachycardia) ([place]) 08/31/2015   Diabetes mellitus, type II ([place]) 06/14/2015   Unstable angina ([place]) 06/08/2015   Encounter for interrogation of neurostimulator 03/22/2015   [employer] spinal cord stimulator (implant date: 11/01/2014) 03/22/2015   Type 2 diabetes mellitus with hyperglycemia ([place]) 03/15/2015   Uncontrolled type 2 diabetes mellitus with hyperglycemia, with long-term current use of insulin ([place]) 03/15/2015   DM type 2 with diabetic peripheral neuropathy ([place]) 03/15/2015   Acid reflux 12/21/2014   OSA (obstructive sleep apnea) 12/21/2014   S/P insertion of spinal cord stimulator 12/21/2014   Chronic low back pain (Bilateral) (L>R) w/ sciatica (Left) 12/21/2014   Failed cervical surgery syndrome (C5-6 ACDF by Dr. Beverely Pace at [facility] on 01/04/2013) 12/21/2014   Long term current use of opiate analgesic 12/21/2014   Long term prescription opiate use 12/21/2014   Opiate use (60 MME/Day) 12/21/2014   Opiate dependence ([place]) 12/21/2014    Encounter for therapeutic drug level monitoring 12/21/2014   Neurogenic pain 12/21/2014   Thoracic facet syndrome (T8-10) 12/21/2014   Lumbar facet syndrome (Bilateral) (R>L) 12/21/2014   Cervical facet syndrome (Right) 12/21/2014   Cervical spondylosis 12/21/2014   Lumbar spondylosis 12/21/2014   Chronic upper extremity pain (Left) 12/21/2014   Chronic cervical radicular pain (Left) 12/21/2014   Chronic upper back pain 12/21/2014   History of thoracic spine surgery (S/P T9-10 IVD spacer) 12/21/2014   Failed back surgical syndrome 12/21/2014   Chronic musculoskeletal pain 12/21/2014   Myofascial pain 12/21/2014   Chronic lower extremity pain (Left) 12/21/2014   Chronic radicular lumbar pain (left L4 dermatomal pain) 12/21/2014   Coronary artery disease 12/21/2014   History of MI (myocardial infarction) (May 2012) 12/21/2014   Chronic anticoagulation ([place]) 12/21/2014   History of Meniere's disease 12/21/2014   Sleep apnea 12/21/2014   History of spinal surgery 11/14/2014   Hypotension due to hypovolemia 09/22/2014   Benign essential HTN 09/14/2014   Hypertension associated with diabetes ([place]) 09/14/2014   Barrett esophagus 08/12/2014   H/O adenomatous polyp of colon 08/12/2014   Dysphagia 08/12/2014   GERD (gastroesophageal reflux disease)  01/25/2014   Arteriosclerosis of coronary artery 08/13/2013   Hyperlipidemia 08/13/2013   CAD (coronary artery disease) 08/13/2013   Hyperlipidemia associated with type 2 diabetes mellitus ([place]) 08/13/2013   Auditory vertigo 12/30/2012   Cervical spinal stenosis 12/11/2012   Arthralgia of shoulder 03/16/2012    Past Surgical History:  Procedure Laterality Date   APPENDECTOMY     age 40   ARTHRODESIS ANTERIOR ANTERIOR CERVICLE SPINE  01/04/2013   BACK SURGERY     fusion thoracic area   CARDIAC CATHETERIZATION     may 2012 and Nov 20, 2010   CARDIAC CATHETERIZATION N/A 06/15/2015   Procedure: Left Heart Cath and Coronary Angiography;   Surgeon: Corey Skains, MD;  Location: [place] CV LAB;  Service: Cardiovascular;  Laterality: N/A;   CARDIAC CATHETERIZATION N/A 06/15/2015   Procedure: Coronary Stent Intervention;  Surgeon: Isaias Cowman, MD;  Location: [place] CV LAB;  Service: Cardiovascular;  Laterality: N/A;   CARDIOVASCULAR STRESS TEST     jan 2014   COLONOSCOPY WITH PROPOFOL N/A 09/19/2014   Procedure: COLONOSCOPY WITH PROPOFOL;  Surgeon: Manya Silvas, MD;  Location: [facility] ENDOSCOPY;  Service: Endoscopy;  Laterality: N/A;   CORONARY ANGIOPLASTY     stent placement x3   ESOPHAGOGASTRODUODENOSCOPY N/A 09/19/2014   Procedure: ESOPHAGOGASTRODUODENOSCOPY (EGD);  Surgeon: Manya Silvas, MD;  Location: [facility] ENDOSCOPY;  Service: Endoscopy;  Laterality: N/A;   IR PERC CHOLECYSTOSTOMY  02/09/2020   IR RADIOLOGIST EVAL & MGMT  03/14/2020   LABRINTHECTOMY     1999 right ear   LUMBAR SPINAL CORD SIMULATOR LEAD REMOVAL Right 08/09/2019   Procedure: REMOVAL SPINAL CORD STIMULATOR PERCUTANEOUS LEADS, REMOVAL PULSE GENERATOR;  Surgeon: [name], MD;  Location: [facility] ORS;  Service: Neurosurgery;  Laterality: Right;  LOCAL WITH MAC   mastoid shunt Bilateral    left, 2002, 1997 right ear, 1980 right ear   PULSE GENERATOR IMPLANT N/A 01/18/2019   Procedure: [employer] SPINAL CORD STIMULATOR BATTERY EXCHANGE;  Surgeon: [name], MD;  Location: [facility] ORS;  Service: Neurosurgery;  Laterality: N/A;   ROBOT ASSISTED LAPAROSCOPIC NEPHRECTOMY Left 04/20/2020   Procedure: XI ROBOTIC ASSITED LAPAROSCOPIC NEPHRECTOMY;  Surgeon: Raynelle Bring, MD;  Location: [facility] ORS;  Service: Urology;  Laterality: Left;   SAVORY DILATION N/A 09/19/2014   Procedure: SAVORY DILATION;  Surgeon: Manya Silvas, MD;  Location: [facility] ENDOSCOPY;  Service: Endoscopy;  Laterality: N/A;   SHOULDER ARTHROSCOPY WITH SUBACROMIAL DECOMPRESSION Left 04/06/2012   Procedure: SHOULDER ARTHROSCOPY WITH SUBACROMIAL DECOMPRESSION;  Surgeon: Vickey Huger, MD;   Location: [place];  Service: Orthopedics;  Laterality: Left;  left shoulder arthroscopy, subacromial decompression and distal clavicle resection   SPINAL CORD STIMULATOR IMPLANT Right     Prior to Admission medications   Medication Sig Start Date End Date Taking? Authorizing Provider  ALPRAZolam Duanne Moron) 0.5 MG tablet Take 1 tablet (0.5 mg total) by mouth at bedtime. 04/26/20   Little Ishikawa, MD  aspirin EC 81 MG tablet Take 81 mg by mouth at bedtime. Swallow whole.    [provider]  cetirizine (ZYRTEC) 10 MG tablet Take 10 mg by mouth daily.     [provider]  cyclobenzaprine (FLEXERIL) 10 MG tablet Take 1 tablet (10 mg total) by mouth 3 (three) times daily as needed for muscle spasms. 04/26/20   Little Ishikawa, MD  diazepam (VALIUM) 5 MG tablet Take 1 tablet (5 mg total) by mouth See admin instructions. Take 5 mg in the morning and additional if  needed for Meniere's disease up to three a day 04/26/20   Little Ishikawa, MD  diltiazem (CARDIZEM CD) 240 MG 24 hr capsule Take 240 mg by mouth daily. 03/30/18   [provider]  enalapril (VASOTEC) 10 MG tablet Take 10 mg by mouth daily. 09/07/20   [provider]  esomeprazole (NEXIUM) 40 MG capsule Take 40 mg by mouth 2 (two) times daily.    [provider]  ezetimibe (ZETIA) 10 MG tablet Take 10 mg by mouth daily. 03/26/17   [provider]  gabapentin (NEURONTIN) 800 MG tablet Take 800 mg by mouth 2 (two) times daily.    [provider]  Glucagon (BAQSIMI ONE PACK NA) Place 1 Dose into the nose daily as needed (severely low blood sugar).    [provider]  HUMULIN R U-500 KWIKPEN 500 UNIT/ML kwikpen Inject 40 Units into the skin 3 (three) times daily with meals. Patient taking differently: Inject 65-75 Units into the skin See admin instructions. Sliding scale  Can adjust according to blood sugar Inject 75 units with breakfast, 75 units with lunch, 65 units with  dinner 01/19/18   Max Sane, MD  Magnesium Oxide 500 MG TABS Take 500 mg by mouth daily.    [provider]  meclizine (ANTIVERT) 25 MG tablet Take 25 mg by mouth See admin instructions. Take 25 mg in the morning, and additional 25 mg if needed up to a total of three per day for Meniere's disease 07/06/15   [provider]  metFORMIN (GLUCOPHAGE) 1000 MG tablet Take 1,000 mg by mouth 2 (two) times daily. 07/25/20   [provider]  metoprolol succinate (TOPROL-XL) 100 MG 24 hr tablet Take 100 mg by mouth 2 (two) times daily. Take with or immediately following a meal.    [provider]  montelukast (SINGULAIR) 10 MG tablet Take 10 mg by mouth at bedtime.     [provider]  niacin (NIASPAN) 500 MG CR tablet Take 500 mg by mouth at bedtime.    [provider]  nitroGLYCERIN (NITROSTAT) 0.4 MG SL tablet Place 0.4 mg under the tongue as needed. 08/03/20   [provider]  oxyCODONE (OXY IR/ROXICODONE) 5 MG immediate release tablet Take 1 tablet (5 mg total) by mouth every 4 (four) hours as needed for severe pain. Must last 30 days. 09/21/20 10/21/20  Milinda Pointer, MD  oxyCODONE (OXY IR/ROXICODONE) 5 MG immediate release tablet Take 1 tablet (5 mg total) by mouth every 4 (four) hours as needed for severe pain. Must last 30 days. 10/21/20 11/20/20  Milinda Pointer, MD  oxyCODONE (OXY IR/ROXICODONE) 5 MG immediate release tablet Take 1 tablet (5 mg total) by mouth every 4 (four) hours as needed for severe pain. Must last 30 days. 11/20/20 12/20/20  Milinda Pointer, MD  polyethylene glycol (MIRALAX / GLYCOLAX) 17 g packet Take 17 g by mouth 2 (two) times daily. 04/26/20   Little Ishikawa, MD  prasugrel (EFFIENT) 10 MG TABS tablet Take 10 mg by mouth daily. 11/22/19   [provider]  rosuvastatin (CRESTOR) 40 MG tablet Take 40 mg by mouth at bedtime.    [provider]  senna-docusate (SENOKOT-S) 8.6-50 MG tablet Take 1 tablet  by mouth 2 (two) times daily. 04/26/20   Little Ishikawa, MD    Allergies Patient has no known allergies.  Family History  Problem Relation Age of Onset   Heart disease Mother    Diabetes Mother  Heart disease Father    Cancer Sister    Heart disease Maternal Aunt    Heart disease Maternal Uncle    Diabetes Maternal Grandmother    Diabetes Paternal Grandmother     Social History Social History   Tobacco Use   Smoking status: Never   Smokeless tobacco: Never  Vaping Use   Vaping Use: Never used  Substance Use Topics   Alcohol use: No   Drug use: No    Review of Systems Constitutional: No fever/chills.  Positive for abnormal labs. Eyes: No visual changes. ENT: No sore throat. Cardiovascular: Denies chest pain. Respiratory: Denies shortness of breath. Gastrointestinal: Diarrheal illness with 1 episode of vomiting a week ago, nothing since then.  Some constipation after taking Imodium. Genitourinary: Positive for urinary hesitancy and some mild dysuria. Musculoskeletal: Positive for intermittent right flank pain. Integumentary: Negative for rash. Neurological: Negative for headaches, focal weakness or numbness.   ____________________________________________   PHYSICAL EXAM:  VITAL SIGNS: ED Triage Vitals  Enc Vitals Group     BP 11/09/20 2003 124/72     Pulse Rate 11/09/20 2003 75     Resp 11/09/20 2003 16     Temp 11/09/20 2008 97.9 F (36.6 C)     Temp Source 11/09/20 2008 Oral     SpO2 11/09/20 2003 100 %     Weight 11/09/20 2003 88.5 kg (195 lb)     Height 11/09/20 2003 1.753 m (_0 )     Head Circumference --      Peak Flow --      Pain Score --      Pain Loc --      Pain Edu? --      Excl. in [place]? --     Constitutional: Alert and oriented.  Eyes: Conjunctivae are normal.  Head: Atraumatic. Nose: No congestion/rhinnorhea. Mouth/Throat: Patient is wearing a mask. Neck: No stridor.  No meningeal signs.   Cardiovascular: Normal rate,  regular rhythm. Good peripheral circulation. Respiratory: Normal respiratory effort.  No retractions. Gastrointestinal: Soft and nontender. No distention.  Musculoskeletal: No tenderness to percussion of the right flank.  No lower extremity tenderness nor edema. No gross deformities of extremities. Neurologic:  Normal speech and language. No gross focal neurologic deficits are appreciated.  Skin:  Skin is warm, dry and intact. Psychiatric: Mood and affect are normal. Speech and behavior are normal.  ____________________________________________   LABS (all labs ordered are listed, but only abnormal results are displayed)  Labs Reviewed  BASIC METABOLIC PANEL - Abnormal; Notable for the following components:      Result Value   Potassium 5.8 (*)    Glucose, Bld 165 (*)    BUN 38 (*)    Creatinine, Ser 2.83 (*)    GFR, Estimated 24 (*)    All other components within normal limits  URINALYSIS, COMPLETE (UACMP) WITH MICROSCOPIC - Abnormal; Notable for the following components:   Color, Urine YELLOW (*)    APPearance HAZY (*)    Ketones, ur 5 (*)    Protein, ur 30 (*)    All other components within normal limits  MAGNESIUM - Abnormal; Notable for the following components:   Magnesium 1.4 (*)    All other components within normal limits  BASIC METABOLIC PANEL - Abnormal; Notable for the following components:   Chloride 112 (*)    Glucose, Bld 159 (*)    BUN 37 (*)    Creatinine, Ser 2.14 (*)    Calcium 8.8 (*)  GFR, Estimated 33 (*)    All other components within normal limits  PHOSPHORUS   ____________________________________________  EKG  ED ECG REPORT I, Hinda Kehr, the attending physician, personally viewed and interpreted this ECG.  Date: 11/09/2020 EKG Time: 20: 13 Rate: 73 Rhythm: normal sinus rhythm QRS Axis: normal Intervals: normal ST/T Wave abnormalities: Non-specific ST segment / T-wave changes, but no clear evidence of acute ischemia. Narrative  Interpretation: no definitive evidence of acute ischemia; does not meet STEMI criteria.  ____________________________________________  RADIOLOGY I, Hinda Kehr, personally viewed and evaluated these images (plain radiographs) as part of my medical decision making, as well as reviewing the written report by the radiologist.  ED MD interpretation: No acute abnormalities identified on CT renal stone protocol.  Official radiology report(s): CT Renal Stone Study  Result Date: 11/10/2020 CLINICAL DATA:  Flank pain. Concern for kidney stone. History of prior left nephrectomy. EXAM: CT ABDOMEN AND PELVIS WITHOUT CONTRAST TECHNIQUE: Multidetector CT imaging of the abdomen and pelvis was performed following the standard protocol without IV contrast. COMPARISON:  CT abdomen pelvis dated 04/23/2020. FINDINGS: Evaluation of this exam is limited in the absence of intravenous contrast. Lower chest: The visualized lung bases are clear. Three vessel coronary vascular calcification. No intra-abdominal free air or free fluid. Hepatobiliary: The liver is unremarkable. No intrahepatic biliary dilatation. Cholecystectomy. Pancreas: Unremarkable. No pancreatic ductal dilatation or surrounding inflammatory changes. Spleen: Normal in size without focal abnormality. Adrenals/Urinary Tract: Status post prior left nephrectomy. There is no hydronephrosis or nephrolithiasis on the right. The right ureter and urinary bladder appear unremarkable. Stomach/Bowel: Moderate stool throughout the colon. There is no bowel obstruction or active inflammation. Appendectomy. Vascular/Lymphatic: Mild aortoiliac atherosclerotic disease. The IVC is unremarkable no portal venous gas. There is no adenopathy. Reproductive: The prostate and seminal vesicles are grossly unremarkable. No pelvic mass. Other: None Musculoskeletal: No acute or significant osseous findings. IMPRESSION: 1. No acute intra-abdominal or pelvic pathology. No hydronephrosis or  nephrolithiasis. 2. Moderate colonic stool burden. No bowel obstruction. 3. Aortic Atherosclerosis (ICD10-I70.0). Electronically Signed   By: Anner Crete M.D.   On: 11/10/2020 00:56    ____________________________________________   INITIAL IMPRESSION / MDM / ASSESSMENT AND PLAN / ED COURSE  As part of my medical decision making, I reviewed the following data within the [place] History obtained from family, Nursing notes reviewed and incorporated, Labs reviewed , EKG interpreted , Old chart reviewed, Discussed with nephrologist (Dr. Murlean Iba), and reviewed Notes from prior ED visits   Differential diagnosis includes, but is not limited to, nonspecific acute kidney injury/renal failure, obstructive uropathy, UTI/pyelonephritis, other metabolic or electrolyte abnormality.  I looked through the medical records and saw that the labs from yesterday showed a potassium of 6.8 which I suspect was falsely elevated from hemolysis.  His basic metabolic panel tonight shows a potassium which is still elevated but only at 5.8.  However of more concern is his creatinine of 2.83.  Based on labs in [facility] and care everywhere, it appears that his creatinine is usually down below 2, even after his nephrectomy.  I obtained a CT renal stone protocol which was reassuring with no sign of obstructive uropathy.  We are awaiting urinalysis.  Vital signs are all stable and within normal limits.  This could be volume related given his recent diarrheal illness.  I am providing 2 L of lactated Ringer's.  I called and spoke by phone with nephrology, Dr. Murlean Iba.  We had an extended conversation  with the patient.  The current plan is to provide the 2 L of IV fluids and 1 dose of Lokelma 10 g p.o.  We will then wait about 30 minutes after the completion of the fluids and recheck a metabolic panel.  If he is showing any improvement in his creatinine and potassium, Dr. Candiss Norse feels that he is appropriate  for discharge and outpatient follow-up with the patient's own nephrologist or with Dr. Candiss Norse.  However if his renal function is worsening he will need admission.   Clinical Course as of 11/10/20 0451  Fri Nov 10, 2020  0120 Magnesium(!): 1.4 Normal phosphorus, magnesium is a bit low at 1.4 but I will hold off repletion for now [CF]  0241 Urinalysis, Complete w Microscopic Urine, Clean Catch(!) Generally reassuring urinalysis [CF]  4696 Basic metabolic panel(!) Patient's repeat basic metabolic panel is reassuring.  His creatinine has come down substantially to 2.14 which is very close to his baseline.  His potassium has resolved down to 4.4.  As per my discussion with Dr. Candiss Norse, I discussed the results with the patient and his wife and they are comfortable with the plan for discharge and follow-up with his nephrologist in [place].  I stressed to them the importance of calling for the next available appointment.  I gave my usual and customary return precautions. [CF]    Clinical Course User Index [CF] Hinda Kehr, MD     ____________________________________________  FINAL CLINICAL IMPRESSION(S) / ED DIAGNOSES  Final diagnoses:  Acute kidney injury ([place])  Hyperkalemia     MEDICATIONS GIVEN DURING THIS VISIT:  Medications  lactated ringers bolus 1,000 mL (0 mLs Intravenous Stopped 11/10/20 0330)  lactated ringers bolus 1,000 mL (0 mLs Intravenous Stopped 11/10/20 0330)  sodium zirconium cyclosilicate (LOKELMA) packet 10 g (10 g Oral Given 11/10/20 0159)     ED Discharge Orders     None        Note:  This document was prepared using Dragon voice recognition software and may include unintentional dictation errors.   Hinda Kehr, MD 11/10/20 [phone]

## 2020-11-10 NOTE — Discharge Instructions (Signed)
As we discussed, when you initially came in your potassium was a bit elevated at 5.8, but your creatinine was significantly elevated at about 2.8.  This is above your baseline and was somewhat concerning given your history of left nephrectomy.  We discussed the situation with our nephrologist and provided 2 L of IV fluids and 1 dose of a medication called Lokelma.  After the fluids, your metabolic panel improved significantly and your potassium was down to 4.4 and your creatinine was down to 2.14.  Given the substantial improvement, you are appropriate for discharge and outpatient follow-up with your nephrologist, but we encourage you to call your nephrologist this morning and schedule the next available follow-up appointment for repeat blood work and reassessment by the specialist.  Continue to drink plenty of fluids and take your regular medications.  Return to the emergency department if you develop new or worsening symptoms that concern you.

## 2020-12-08 ENCOUNTER — Other Ambulatory Visit (HOSPITAL_COMMUNITY): Payer: Self-pay | Admitting: Urology

## 2020-12-08 ENCOUNTER — Ambulatory Visit (HOSPITAL_COMMUNITY)
Admission: RE | Admit: 2020-12-08 | Discharge: 2020-12-08 | Disposition: A | Discharge status: Home / Self Care | Payer: Medicare Other | Source: Ambulatory Visit | Attending: Urology | Admitting: Urology

## 2020-12-08 ENCOUNTER — Other Ambulatory Visit: Payer: Self-pay

## 2020-12-08 DIAGNOSIS — C642 Malignant neoplasm of left kidney, except renal pelvis: Secondary | ICD-10-CM | POA: Diagnosis not present

## 2020-12-11 ENCOUNTER — Emergency Department (HOSPITAL_COMMUNITY): Payer: Medicare Other

## 2020-12-11 ENCOUNTER — Encounter (HOSPITAL_COMMUNITY): Payer: Self-pay | Admitting: Emergency Medicine

## 2020-12-11 ENCOUNTER — Emergency Department (HOSPITAL_COMMUNITY)
Admission: EM | Admit: 2020-12-11 | Discharge: 2020-12-12 | Disposition: A | Discharge status: Home / Self Care | Payer: Medicare Other | Attending: Emergency Medicine | Admitting: Emergency Medicine

## 2020-12-11 ENCOUNTER — Other Ambulatory Visit: Payer: Self-pay

## 2020-12-11 DIAGNOSIS — E1142 Type 2 diabetes mellitus with diabetic polyneuropathy: Secondary | ICD-10-CM | POA: Insufficient documentation

## 2020-12-11 DIAGNOSIS — Z85520 Personal history of malignant carcinoid tumor of kidney: Secondary | ICD-10-CM | POA: Diagnosis not present

## 2020-12-11 DIAGNOSIS — R000 Tachycardia, unspecified: Secondary | ICD-10-CM | POA: Diagnosis not present

## 2020-12-11 DIAGNOSIS — R3915 Urgency of urination: Secondary | ICD-10-CM | POA: Insufficient documentation

## 2020-12-11 DIAGNOSIS — R509 Fever, unspecified: Secondary | ICD-10-CM | POA: Insufficient documentation

## 2020-12-11 DIAGNOSIS — Z20822 Contact with and (suspected) exposure to covid-19: Secondary | ICD-10-CM | POA: Diagnosis not present

## 2020-12-11 DIAGNOSIS — Z7984 Long term (current) use of oral hypoglycemic drugs: Secondary | ICD-10-CM | POA: Diagnosis not present

## 2020-12-11 DIAGNOSIS — Z79899 Other long term (current) drug therapy: Secondary | ICD-10-CM | POA: Diagnosis not present

## 2020-12-11 DIAGNOSIS — R0600 Dyspnea, unspecified: Secondary | ICD-10-CM

## 2020-12-11 DIAGNOSIS — R0602 Shortness of breath: Secondary | ICD-10-CM | POA: Insufficient documentation

## 2020-12-11 DIAGNOSIS — I10 Essential (primary) hypertension: Secondary | ICD-10-CM | POA: Insufficient documentation

## 2020-12-11 DIAGNOSIS — R0789 Other chest pain: Secondary | ICD-10-CM | POA: Insufficient documentation

## 2020-12-11 DIAGNOSIS — R110 Nausea: Secondary | ICD-10-CM | POA: Diagnosis not present

## 2020-12-11 DIAGNOSIS — Z794 Long term (current) use of insulin: Secondary | ICD-10-CM | POA: Diagnosis not present

## 2020-12-11 DIAGNOSIS — Z7982 Long term (current) use of aspirin: Secondary | ICD-10-CM | POA: Diagnosis not present

## 2020-12-11 DIAGNOSIS — I2510 Atherosclerotic heart disease of native coronary artery without angina pectoris: Secondary | ICD-10-CM | POA: Insufficient documentation

## 2020-12-11 DIAGNOSIS — R079 Chest pain, unspecified: Secondary | ICD-10-CM

## 2020-12-11 LAB — URINALYSIS, ROUTINE W REFLEX MICROSCOPIC
Bilirubin Urine: NEGATIVE
Glucose, UA: >=500 mg/dL — AB
Hgb urine dipstick: NEGATIVE
Ketones, ur: NEGATIVE mg/dL
Leukocytes,Ua: NEGATIVE
Nitrite: NEGATIVE
Protein, ur: 100 mg/dL — AB
Specific Gravity, Urine: 1.023 (ref 1.005–1.030)
pH: 5 (ref 5.0–8.0)

## 2020-12-11 LAB — COMPREHENSIVE METABOLIC PANEL
ALT: 17 U/L (ref 0–44)
AST: 18 U/L (ref 15–41)
Albumin: 4.6 g/dL (ref 3.5–5.0)
Alkaline Phosphatase: 92 U/L (ref 38–126)
Anion gap: 9 (ref 5–15)
BUN: 21 mg/dL (ref 8–23)
CO2: 25 mmol/L (ref 22–32)
Calcium: 10.6 mg/dL — ABNORMAL HIGH (ref 8.9–10.3)
Chloride: 104 mmol/L (ref 98–111)
Creatinine, Ser: 1.49 mg/dL — ABNORMAL HIGH (ref 0.61–1.24)
GFR, Estimated: 51 mL/min — ABNORMAL LOW (ref >60)
Glucose, Bld: 185 mg/dL — ABNORMAL HIGH (ref 70–99)
Potassium: 4.7 mmol/L (ref 3.5–5.1)
Sodium: 138 mmol/L (ref 135–145)
Total Bilirubin: 0.5 mg/dL (ref 0.3–1.2)
Total Protein: 8.4 g/dL — ABNORMAL HIGH (ref 6.5–8.1)

## 2020-12-11 LAB — RAPID URINE DRUG SCREEN, HOSP PERFORMED
Amphetamines: NOT DETECTED
Barbiturates: NOT DETECTED
Benzodiazepines: POSITIVE — AB
Cocaine: NOT DETECTED
Opiates: NOT DETECTED
Tetrahydrocannabinol: NOT DETECTED

## 2020-12-11 LAB — CBC WITH DIFFERENTIAL/PLATELET
Abs Immature Granulocytes: 0.03 10*3/uL (ref 0.00–0.07)
Basophils Absolute: 0.1 10*3/uL (ref 0.0–0.1)
Basophils Relative: 1 %
Eosinophils Absolute: 0.1 10*3/uL (ref 0.0–0.5)
Eosinophils Relative: 1 %
HCT: 46.6 % (ref 39.0–52.0)
Hemoglobin: 16 g/dL (ref 13.0–17.0)
Immature Granulocytes: 0 %
Lymphocytes Relative: 37 %
Lymphs Abs: 2.8 10*3/uL (ref 0.7–4.0)
MCH: 27.6 pg (ref 26.0–34.0)
MCHC: 34.3 g/dL (ref 30.0–36.0)
MCV: 80.3 fL (ref 80.0–100.0)
Monocytes Absolute: 0.8 10*3/uL (ref 0.1–1.0)
Monocytes Relative: 10 %
Neutro Abs: 3.9 10*3/uL (ref 1.7–7.7)
Neutrophils Relative %: 51 %
Platelets: 249 10*3/uL (ref 150–400)
RBC: 5.8 MIL/uL (ref 4.22–5.81)
RDW: 14.1 % (ref 11.5–15.5)
WBC: 7.7 10*3/uL (ref 4.0–10.5)
nRBC: 0 % (ref 0.0–0.2)

## 2020-12-11 LAB — LACTIC ACID, PLASMA
Lactic Acid, Venous: 1.9 mmol/L (ref 0.5–1.9)
Lactic Acid, Venous: 1.5 mmol/L (ref 0.5–1.9)

## 2020-12-11 LAB — MAGNESIUM: Magnesium: 1.6 mg/dL — ABNORMAL LOW (ref 1.7–2.4)

## 2020-12-11 LAB — CBG MONITORING, ED: Glucose-Capillary: 185 mg/dL — ABNORMAL HIGH (ref 70–99)

## 2020-12-11 LAB — TSH: TSH: 0.561 u[IU]/mL (ref 0.350–4.500)

## 2020-12-11 LAB — TROPONIN I (HIGH SENSITIVITY)
Troponin I (High Sensitivity): 4 ng/L (ref <18)
Troponin I (High Sensitivity): 5 ng/L (ref <18)

## 2020-12-11 LAB — LIPASE, BLOOD: Lipase: 28 U/L (ref 11–51)

## 2020-12-11 MED ORDER — SODIUM CHLORIDE 0.9 % IV BOLUS
1000.0000 mL | Freq: Once | INTRAVENOUS | Status: AC
  Administered 2020-12-11: 1000 mL via INTRAVENOUS

## 2020-12-11 MED ORDER — LACTATED RINGERS IV BOLUS
1000.0000 mL | Freq: Once | INTRAVENOUS | Status: AC
  Administered 2020-12-11: 1000 mL via INTRAVENOUS

## 2020-12-11 MED ORDER — MAGNESIUM SULFATE 2 GM/50ML IV SOLN
2.0000 g | Freq: Once | INTRAVENOUS | Status: AC
  Administered 2020-12-11: 2 g via INTRAVENOUS
  Filled 2020-12-11: qty 50

## 2020-12-11 MED ORDER — IOHEXOL 350 MG/ML SOLN
80.0000 mL | Freq: Once | INTRAVENOUS | Status: AC | PRN
  Administered 2020-12-11: 80 mL via INTRAVENOUS

## 2020-12-11 NOTE — ED Provider Notes (Signed)
[place] DEPT Provider Note   CSN: [id number] Arrival date & time: 12/11/20  1952     History Chief Complaint  Patient presents with   Abnormal Lab   Chest Pain    EARL LOSEE is a 65 y.o. male.  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   Saw Urology and was told potassium was high Able to urinate earlier, had significant urgency, felt  the need to go and couldn't get there and was incontinent> Has been having some dysuria, maybe yesterday and earlier in the week Nausea, no vomiting First of the week had some constipation On medication for pain so has variable  Chest pain today, feels like pressure in the chest, like someone pushing on chest.  Some shortness of breath. No leg swelling but bilateral chronic leg pain.  CP 4/10, deep breaths make it worse  Felt like running a fever this afternoon Didn't take temperature, but felt as if had one  In September was 5.8   Past Medical History:  Diagnosis Date   Acute postoperative pain 12/03/2018   Allergic rhinitis 12/30/2012   Anginal pain ([place])    Anxiety    Bronchitis    hx of   Can't get food down 08/12/2014   Cancer ([place]) 01/2020   kidney    Chronic back pain    thoracic area   Concussion 09/2015   Coronary artery disease    99% blockage   DDD (degenerative disc disease), cervical    Dehydration symptoms    2019   Diabetes mellitus without complication (HCC)    insulin dependent   Dysphagia    GERD (gastroesophageal reflux disease)    History of Meniere's disease 12/21/2014   History of thoracic spine surgery (S/P T9-10 IVD spacer) 12/21/2014   Hypercholesteremia    Hyperlipidemia    Hypertension    sees Dr. 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 04/20/2020   Acute on chronic cholecystitis s/p robotic cholecystectomy 04/20/2020 04/20/2020   Elevated LFTs    Renal mass    Hypokalemia    Chronic low back pain (Bilateral) w/o sciatica 12/16/2019   Hyperglycemia 12/16/2019   Pharmacologic therapy 08/03/2019   Disorder of skeletal system 08/03/2019   Problems influencing health status 08/03/2019   Postop check 12/17/2018   Acute postoperative pain 12/03/2018   DDD (degenerative disc disease), lumbosacral 09/21/2018   Diabetic polyneuropathy associated with diabetes mellitus due to underlying condition ([place]) 09/10/2018   Neuropathic pain 09/10/2018   Musculoskeletal pain 09/10/2018   AKI (acute  kidney injury) ([place]) 02/19/2018   Frequent falls 02/07/2018   Hyperkalemia 01/18/2018   Spondylosis without myelopathy or radiculopathy, lumbosacral region 11/13/2017   Cardiac syncope 09/08/2017   History of coronary artery disease 09/03/2017   Spinal cord stimulator dysfunction ([place]) 12/26/2016   Trigger point posterior superior iliac spine (PSIS) (Right) 12/25/2016   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) 12/25/2016   Polyneuropathy 12/18/2016   Numbness and tingling 11/12/2016   Tachycardia with heart rate 100-120 beats per minute 11/12/2016    Meniere disease, bilateral 11/12/2016   Headache disorder 11/12/2016   Type 2 diabetes mellitus with diabetic neuropathy ([place]) 11/12/2016   Type 2 diabetes mellitus with diabetic polyneuropathy, with long-term current use of insulin ([place]) 11/12/2016   Tremor 09/11/2016   Chronic pain syndrome 05/28/2016   Bilateral carotid artery stenosis 01/24/2016   Mild cognitive impairment 11/27/2015   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) 11/27/2015   Balance problem 10/09/2015   Chronic tension-type headache, not intractable 10/09/2015   Dizziness 10/09/2015   Post-concussion headache 10/09/2015   PSVT (paroxysmal supraventricular tachycardia) ([place]) 08/31/2015   Diabetes mellitus, type II ([place]) 06/14/2015   Unstable angina ([place]) 06/08/2015   Encounter for interrogation of neurostimulator 03/22/2015   [employer] spinal cord stimulator (implant date: 11/01/2014) 03/22/2015   Type 2 diabetes mellitus with hyperglycemia ([place]) 03/15/2015   Uncontrolled type 2 diabetes mellitus with hyperglycemia, with long-term current use of insulin ([place]) 03/15/2015   DM type 2 with diabetic peripheral neuropathy ([place]) 03/15/2015   Acid reflux 12/21/2014   OSA (obstructive sleep apnea) 12/21/2014   S/P insertion of spinal cord stimulator 12/21/2014   Chronic low back pain (Bilateral) (L>R) w/ sciatica (Left) 12/21/2014   Failed cervical surgery syndrome (C5-6 ACDF by Dr. Beverely Pace at [facility] on 01/04/2013) 12/21/2014   Long term current use of opiate analgesic 12/21/2014   Long term prescription opiate use 12/21/2014   Opiate use (60 MME/Day) 12/21/2014   Opiate dependence ([place]) 12/21/2014   Encounter for therapeutic drug level monitoring 12/21/2014   Neurogenic pain 12/21/2014   Thoracic facet syndrome (T8-10) 12/21/2014   Lumbar facet syndrome (Bilateral) (R>L) 12/21/2014   Cervical facet syndrome (Right) 12/21/2014   Cervical spondylosis 12/21/2014   Lumbar spondylosis 12/21/2014    Chronic upper extremity pain (Left) 12/21/2014   Chronic cervical radicular pain (Left) 12/21/2014   Chronic upper back pain 12/21/2014   History of thoracic spine surgery (S/P T9-10 IVD spacer) 12/21/2014   Failed back surgical syndrome 12/21/2014   Chronic musculoskeletal pain 12/21/2014   Myofascial pain 12/21/2014   Chronic lower extremity pain (Left) 12/21/2014   Chronic radicular lumbar pain (left L4 dermatomal pain) 12/21/2014   Coronary artery disease 12/21/2014   History of MI (myocardial infarction) (May 2012) 12/21/2014   Chronic anticoagulation ([place]) 12/21/2014   History of Meniere's disease 12/21/2014   Sleep apnea 12/21/2014   History of spinal surgery 11/14/2014   Hypotension due to hypovolemia 09/22/2014   Benign essential HTN 09/14/2014   Hypertension associated with diabetes ([place]) 09/14/2014   Barrett esophagus 08/12/2014   H/O adenomatous polyp of colon 08/12/2014   Dysphagia 08/12/2014   GERD (gastroesophageal reflux disease) 01/25/2014   Arteriosclerosis of coronary artery 08/13/2013   Hyperlipidemia 08/13/2013   CAD (coronary artery disease) 08/13/2013   Hyperlipidemia associated with type 2 diabetes mellitus ([place]) 08/13/2013   Auditory vertigo 12/30/2012   Cervical spinal stenosis 12/11/2012   Arthralgia of shoulder 03/16/2012    Past Surgical History:  Procedure Laterality  Date   APPENDECTOMY     age 17   ARTHRODESIS ANTERIOR ANTERIOR CERVICLE SPINE  01/04/2013   BACK SURGERY     fusion thoracic area   CARDIAC CATHETERIZATION     may 2012 and Nov 20, 2010   CARDIAC CATHETERIZATION N/A 06/15/2015   Procedure: Left Heart Cath and Coronary Angiography;  Surgeon: Corey Skains, MD;  Location: [facility] CV LAB;  Service: Cardiovascular;  Laterality: N/A;   CARDIAC CATHETERIZATION N/A 06/15/2015   Procedure: Coronary Stent Intervention;  Surgeon: Isaias Cowman, MD;  Location: [place] CV LAB;  Service: Cardiovascular;  Laterality: N/A;    CARDIOVASCULAR STRESS TEST     jan 2014   COLONOSCOPY WITH PROPOFOL N/A 09/19/2014   Procedure: COLONOSCOPY WITH PROPOFOL;  Surgeon: Manya Silvas, MD;  Location: [facility] ENDOSCOPY;  Service: Endoscopy;  Laterality: N/A;   CORONARY ANGIOPLASTY     stent placement x3   ESOPHAGOGASTRODUODENOSCOPY N/A 09/19/2014   Procedure: ESOPHAGOGASTRODUODENOSCOPY (EGD);  Surgeon: Manya Silvas, MD;  Location: [facility] ENDOSCOPY;  Service: Endoscopy;  Laterality: N/A;   IR PERC CHOLECYSTOSTOMY  02/09/2020   IR RADIOLOGIST EVAL & MGMT  03/14/2020   LABRINTHECTOMY     1999 right ear   LUMBAR SPINAL CORD SIMULATOR LEAD REMOVAL Right 08/09/2019   Procedure: REMOVAL SPINAL CORD STIMULATOR PERCUTANEOUS LEADS, REMOVAL PULSE GENERATOR;  Surgeon: [name], MD;  Location: [facility] ORS;  Service: Neurosurgery;  Laterality: Right;  LOCAL WITH MAC   mastoid shunt Bilateral    left, 2002, 1997 right ear, 1980 right ear   PULSE GENERATOR IMPLANT N/A 01/18/2019   Procedure: [employer] SPINAL CORD STIMULATOR BATTERY EXCHANGE;  Surgeon: [name], MD;  Location: [facility] ORS;  Service: Neurosurgery;  Laterality: N/A;   ROBOT ASSISTED LAPAROSCOPIC NEPHRECTOMY Left 04/20/2020   Procedure: XI ROBOTIC ASSITED LAPAROSCOPIC NEPHRECTOMY;  Surgeon: Raynelle Bring, MD;  Location: [facility] ORS;  Service: Urology;  Laterality: Left;   SAVORY DILATION N/A 09/19/2014   Procedure: SAVORY DILATION;  Surgeon: Manya Silvas, MD;  Location: [facility] ENDOSCOPY;  Service: Endoscopy;  Laterality: N/A;   SHOULDER ARTHROSCOPY WITH SUBACROMIAL DECOMPRESSION Left 04/06/2012   Procedure: SHOULDER ARTHROSCOPY WITH SUBACROMIAL DECOMPRESSION;  Surgeon: Vickey Huger, MD;  Location: [facility];  Service: Orthopedics;  Laterality: Left;  left shoulder arthroscopy, subacromial decompression and distal clavicle resection   SPINAL CORD STIMULATOR IMPLANT Right        Family History  Problem Relation Age of Onset   Heart disease Mother    Diabetes Mother    Heart disease  Father    Cancer Sister    Heart disease Maternal Aunt    Heart disease Maternal Uncle    Diabetes Maternal Grandmother    Diabetes Paternal Grandmother     Social History   Tobacco Use   Smoking status: Never   Smokeless tobacco: Never  Vaping Use   Vaping Use: Never used  Substance Use Topics   Alcohol use: No   Drug use: No    Home Medications Prior to Admission medications   Medication Sig Start Date End Date Taking? Authorizing Provider  ALPRAZolam Duanne Moron) 0.5 MG tablet Take 1 tablet (0.5 mg total) by mouth at bedtime. 04/26/20   Little Ishikawa, MD  aspirin EC 81 MG tablet Take 81 mg by mouth at bedtime. Swallow whole.    [provider]  cetirizine (ZYRTEC) 10 MG tablet Take 10 mg by mouth daily.     [provider]  cyclobenzaprine (FLEXERIL) 10 MG tablet  Take 1 tablet (10 mg total) by mouth 3 (three) times daily as needed for muscle spasms. 04/26/20   Little Ishikawa, MD  diazepam (VALIUM) 5 MG tablet Take 1 tablet (5 mg total) by mouth See admin instructions. Take 5 mg in the morning and additional if needed for Meniere's disease up to three a day 04/26/20   Little Ishikawa, MD  diltiazem (CARDIZEM CD) 240 MG 24 hr capsule Take 240 mg by mouth daily. 03/30/18   [provider]  enalapril (VASOTEC) 10 MG tablet Take 10 mg by mouth daily. 09/07/20   [provider]  esomeprazole (NEXIUM) 40 MG capsule Take 40 mg by mouth 2 (two) times daily.    [provider]  ezetimibe (ZETIA) 10 MG tablet Take 10 mg by mouth daily. 03/26/17   [provider]  gabapentin (NEURONTIN) 800 MG tablet Take 800 mg by mouth 2 (two) times daily.    [provider]  Glucagon (BAQSIMI ONE PACK NA) Place 1 Dose into the nose daily as needed (severely low blood sugar).    [provider]  HUMULIN R U-500 KWIKPEN 500 UNIT/ML kwikpen Inject 40 Units into the skin 3 (three) times daily with meals. Patient taking differently:  Inject 65-75 Units into the skin See admin instructions. Sliding scale  Can adjust according to blood sugar Inject 75 units with breakfast, 75 units with lunch, 65 units with dinner 01/19/18   Max Sane, MD  Magnesium Oxide 500 MG TABS Take 500 mg by mouth daily.    [provider]  meclizine (ANTIVERT) 25 MG tablet Take 25 mg by mouth See admin instructions. Take 25 mg in the morning, and additional 25 mg if needed up to a total of three per day for Meniere's disease 07/06/15   [provider]  metFORMIN (GLUCOPHAGE) 1000 MG tablet Take 1,000 mg by mouth 2 (two) times daily. 07/25/20   [provider]  metoprolol succinate (TOPROL-XL) 100 MG 24 hr tablet Take 100 mg by mouth 2 (two) times daily. Take with or immediately following a meal.    [provider]  montelukast (SINGULAIR) 10 MG tablet Take 10 mg by mouth at bedtime.     [provider]  niacin (NIASPAN) 500 MG CR tablet Take 500 mg by mouth at bedtime.    [provider]  nitroGLYCERIN (NITROSTAT) 0.4 MG SL tablet Place 0.4 mg under the tongue as needed. 08/03/20   [provider]  oxyCODONE (OXY IR/ROXICODONE) 5 MG immediate release tablet Take 1 tablet (5 mg total) by mouth every 4 (four) hours as needed for severe pain. Must last 30 days. 09/21/20 10/21/20  Milinda Pointer, MD  oxyCODONE (OXY IR/ROXICODONE) 5 MG immediate release tablet Take 1 tablet (5 mg total) by mouth every 4 (four) hours as needed for severe pain. Must last 30 days. 10/21/20 11/20/20  Milinda Pointer, MD  oxyCODONE (OXY IR/ROXICODONE) 5 MG immediate release tablet Take 1 tablet (5 mg total) by mouth every 4 (four) hours as needed for severe pain. Must last 30 days. 11/20/20 12/20/20  Milinda Pointer, MD  polyethylene glycol (MIRALAX / GLYCOLAX) 17 g packet Take 17 g by mouth 2 (two) times daily. 04/26/20   Little Ishikawa, MD  prasugrel (EFFIENT) 10 MG TABS tablet Take 10 mg by mouth daily. 11/22/19    [provider]  rosuvastatin (CRESTOR) 40 MG tablet Take 40 mg by mouth at bedtime.    [provider]  senna-docusate (SENOKOT-S)  8.6-50 MG tablet Take 1 tablet by mouth 2 (two) times daily. 04/26/20   Little Ishikawa, MD    Allergies    Patient has no known allergies.  Review of Systems   Review of Systems  Constitutional:  Positive for appetite change, chills and fever.  HENT:  Negative for congestion.   Eyes:  Negative for visual disturbance.  Respiratory:  Positive for cough and shortness of breath.   Cardiovascular:  Positive for chest pain. Negative for leg swelling.  Gastrointestinal:  Positive for abdominal pain (suprapubic, and across, that is not new, since surgery), constipation and nausea. Negative for vomiting.  Genitourinary:  Positive for dysuria and urgency.  Musculoskeletal:  Positive for back pain.  Skin:  Negative for rash.  Neurological:  Positive for light-headedness. Negative for headaches.   Physical Exam Updated Vital Signs BP 138/85   Pulse (!) 106   Temp 98.3 F (36.8 C)   Resp (!) 24   SpO2 94%   Physical Exam Vitals and nursing note reviewed.  Constitutional:      General: He is not in acute distress.    Appearance: He is well-developed. He is not diaphoretic.  HENT:     Head: Normocephalic and atraumatic.  Eyes:     Conjunctiva/sclera: Conjunctivae normal.  Cardiovascular:     Rate and Rhythm: Regular rhythm. Tachycardia present.     Heart sounds: Normal heart sounds. No murmur heard.   No friction rub. No gallop.  Pulmonary:     Effort: Pulmonary effort is normal. No respiratory distress.     Breath sounds: Normal breath sounds. No wheezing or rales.  Abdominal:     General: There is no distension.     Palpations: Abdomen is soft.     Tenderness: There is no abdominal tenderness. There is no guarding.  Musculoskeletal:     Cervical back: Normal range of motion.  Skin:    General: Skin is warm and dry.   Neurological:     Mental Status: He is alert and oriented to person, place, and time.    ED Results / Procedures / Treatments   Labs (all labs ordered are listed, but only abnormal results are displayed) Labs Reviewed  COMPREHENSIVE METABOLIC PANEL - Abnormal; Notable for the following components:      Result Value   Glucose, Bld 185 (*)    Creatinine, Ser 1.49 (*)    Calcium 10.6 (*)    Total Protein 8.4 (*)    GFR, Estimated 51 (*)    All other components within normal limits  URINALYSIS, ROUTINE W REFLEX MICROSCOPIC - Abnormal; Notable for the following components:   Glucose, UA >=500 (*)    Protein, ur 100 (*)    Bacteria, UA RARE (*)    All other components within normal limits  MAGNESIUM - Abnormal; Notable for the following components:   Magnesium 1.6 (*)    All other components within normal limits  RAPID URINE DRUG SCREEN, HOSP PERFORMED - Abnormal; Notable for the following components:   Benzodiazepines POSITIVE (*)    All other components within normal limits  CBG MONITORING, ED - Abnormal; Notable for the following components:   Glucose-Capillary 185 (*)    All other components within normal limits  CULTURE, BLOOD (ROUTINE X 2)  CULTURE, BLOOD (ROUTINE X 2)  URINE CULTURE  RESP PANEL BY RT-PCR (FLU A&B, COVID) ARPGX2  CBC WITH DIFFERENTIAL/PLATELET  LIPASE, BLOOD  LACTIC ACID, PLASMA  LACTIC ACID, PLASMA  TSH  T4, FREE  TROPONIN I (HIGH SENSITIVITY)  TROPONIN I (HIGH SENSITIVITY)    EKG EKG Interpretation  Date/Time:  Monday December 11 2020 20:15:46 EDT Ventricular Rate:  137 PR Interval:  137 QRS Duration: 91 QT Interval:  297 QTC Calculation: 449 R Axis:   156 Text Interpretation: Sinus tachycardia Left posterior fascicular block Anterior infarct, old Since prior ECG, rate has increased Confirmed by Gareth Morgan [phone]) on 12/11/2020 8:36:00 PM  Radiology CT Angio Chest PE W and/or Wo Contrast  Result Date: 12/11/2020 CLINICAL DATA:   High probability PE.  Chest pain. EXAM: CT ANGIOGRAPHY CHEST WITH CONTRAST TECHNIQUE: Multidetector CT imaging of the chest was performed using the standard protocol during bolus administration of intravenous contrast. Multiplanar CT image reconstructions and MIPs were obtained to evaluate the vascular anatomy. CONTRAST:  75m OMNIPAQUE IOHEXOL 350 MG/ML SOLN COMPARISON:  Nuclear medicine pulmonary PE study 04/23/2020. CT angiogram chest 02/07/2020. FINDINGS: Cardiovascular: Satisfactory opacification of the pulmonary arteries to the segmental level. No evidence of pulmonary embolism. Normal heart size. No pericardial effusion. Mediastinum/Nodes: No enlarged mediastinal, hilar, or axillary lymph nodes. Thyroid gland, trachea, and esophagus demonstrate no significant findings. Lungs/Pleura: There is a 3 mm nodule in the left upper lobe image 10/43 which is unchanged from the prior exam. There is linear scarring or atelectasis in the left lower lobe. The lungs are otherwise clear. There is no pleural effusion or pneumothorax. Upper Abdomen: No acute abnormality. Musculoskeletal: Disc spacer noted at T8-T9.  No acute fractures. Review of the MIP images confirms the above findings. IMPRESSION: 1. No evidence for pulmonary embolism. No acute cardiopulmonary process. 2. Pathology: 3 mm left solid pulmonary nodule within the upper lobe. If patient is low risk for malignancy, no routine follow-up imaging is recommended; if patient is high risk for malignancy, a non-contrast Chest CT at 12 months is optional. If performed and the nodule is stable at 12 months, no further follow-up is recommended. These guidelines do not apply to immunocompromised patients and patients with cancer. Follow up in patients with significant comorbidities as clinically warranted. For lung cancer screening, adhere to Lung-RADS guidelines. Reference: Radiology. 2017; 284(1):228-43. Electronically Signed   By: ARonney AstersM.D.   On: 12/11/2020  23:09   DG Chest Portable 1 View  Result Date: 12/11/2020 CLINICAL DATA:  Chest pain. EXAM: PORTABLE CHEST 1 VIEW COMPARISON:  Chest radiograph dated 12/08/2020. FINDINGS: No focal consolidation, pleural effusion, or pneumothorax. Stable cardiac silhouette. No acute osseous pathology. Lower cervical ACDF. IMPRESSION: No active disease. Electronically Signed   By: AAnner CreteM.D.   On: 12/11/2020 20:51    Procedures Procedures   Medications Ordered in ED Medications  magnesium sulfate IVPB 2 g 50 mL (has no administration in time range)  sodium chloride 0.9 % bolus 1,000 mL (0 mLs Intravenous Stopped 12/11/20 2308)  iohexol (OMNIPAQUE) 350 MG/ML injection 80 mL (80 mLs Intravenous Contrast Given 12/11/20 2241)  lactated ringers bolus 1,000 mL (1,000 mLs Intravenous New Bag/Given 12/11/20 2309)    ED Course  I have reviewed the triage vital signs and the nursing notes.  Pertinent labs & imaging results that were available during my care of the patient were reviewed by me and considered in my medical decision making (see chart for details).    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  Labs return showing normal potassium.  No sign of significant anemia or other electrolyte abnormality.  Given chest pain, EKG and chest x-ray were performed which showed no evidence of acute abnormality, EKG shows sinus tachycardia.  Troponin is within normal limits, no sign of ACS.  Do not feel history and exam are consistent with aortic dissection.  Given tachycardia, pleuritic chest pain, shortness of breath, history of renal neoplasm, ordered CT PE study for further evaluation.  Does  describe some urinary symptoms, subjective fever, and consider sepsis as etiology.  COVID 19 testing pending.  Have not ordered empiric abx given normal lactic acid ,no leukocytosis, feel tachycardia may be due to PE or other etiology.   CT PE study is completed which shows no evidence of pulmonary embolus or other acute cardiopulmonary process.  Does have a pulmonary nodule, recommend chest CT at 12 months.   Heart rate has improved proved from 142 100s.  COVID, flu testing, TSH and repeat troponin are pending at time of transfer of care to Dr. Roxanne Mins.     Final Clinical Impression(s) / ED Diagnoses Final diagnoses:  Tachycardia  Urinary urgency  Chest pain, unspecified type  Dyspnea, unspecified type    Rx / DC Orders ED Discharge Orders     None        Gareth Morgan, MD 12/11/20 2326

## 2020-12-11 NOTE — ED Provider Notes (Signed)
Care assumed from Dr. Billy Fischer, patient sent in for possible hyperkalemia, found to have normal potassium but mildly hypomagnesemic and he has been given IV magnesium.  Also having subjective fever and chills, respiratory pathogen panel pending, also delta troponin, TSH pending, urinalysis pending.  Respiratory pathogen panel is negative for influenza and COVID-19.  Repeat troponin is normal.  TSH is normal.  Urinalysis shows no evidence of infection.  Heart rate is down to normal.  He is discharged with instructions to talk with his urologist regarding his urinary urgency and urge incontinence.  Results for orders placed or performed during the hospital encounter of 12/11/20  Resp Panel by RT-PCR (Flu A&B, Covid) Nasopharyngeal Swab   Specimen: Nasopharyngeal Swab; Nasopharyngeal(NP) swabs in vial transport medium  Result Value Ref Range   SARS Coronavirus 2 by RT PCR NEGATIVE NEGATIVE   Influenza A by PCR NEGATIVE NEGATIVE   Influenza B by PCR NEGATIVE NEGATIVE  CBC with Differential  Result Value Ref Range   WBC 7.7 4.0 - 10.5 K/uL   RBC 5.80 4.22 - 5.81 MIL/uL   Hemoglobin 16.0 13.0 - 17.0 g/dL   HCT 46.6 39.0 - 52.0 %   MCV 80.3 80.0 - 100.0 fL   MCH 27.6 26.0 - 34.0 pg   MCHC 34.3 30.0 - 36.0 g/dL   RDW 14.1 11.5 - 15.5 %   Platelets 249 150 - 400 K/uL   nRBC 0.0 0.0 - 0.2 %   Neutrophils Relative % 51 %   Neutro Abs 3.9 1.7 - 7.7 K/uL   Lymphocytes Relative 37 %   Lymphs Abs 2.8 0.7 - 4.0 K/uL   Monocytes Relative 10 %   Monocytes Absolute 0.8 0.1 - 1.0 K/uL   Eosinophils Relative 1 %   Eosinophils Absolute 0.1 0.0 - 0.5 K/uL   Basophils Relative 1 %   Basophils Absolute 0.1 0.0 - 0.1 K/uL   Immature Granulocytes 0 %   Abs Immature Granulocytes 0.03 0.00 - 0.07 K/uL  Comprehensive metabolic panel  Result Value Ref Range   Sodium 138 135 - 145 mmol/L   Potassium 4.7 3.5 - 5.1 mmol/L   Chloride 104 98 - 111 mmol/L   CO2 25 22 - 32 mmol/L   Glucose, Bld 185 (H) 70 - 99  mg/dL   BUN 21 8 - 23 mg/dL   Creatinine, Ser 1.49 (H) 0.61 - 1.24 mg/dL   Calcium 10.6 (H) 8.9 - 10.3 mg/dL   Total Protein 8.4 (H) 6.5 - 8.1 g/dL   Albumin 4.6 3.5 - 5.0 g/dL   AST 18 15 - 41 U/L   ALT 17 0 - 44 U/L   Alkaline Phosphatase 92 38 - 126 U/L   Total Bilirubin 0.5 0.3 - 1.2 mg/dL   GFR, Estimated 51 (L) >60 mL/min   Anion gap 9 5 - 15  Lipase, blood  Result Value Ref Range   Lipase 28 11 - 51 U/L  Urinalysis, Routine w reflex microscopic Urine, Clean Catch  Result Value Ref Range   Color, Urine YELLOW YELLOW   APPearance CLEAR CLEAR   Specific Gravity, Urine 1.023 1.005 - 1.030   pH 5.0 5.0 - 8.0   Glucose, UA >=500 (A) NEGATIVE mg/dL   Hgb urine dipstick NEGATIVE NEGATIVE   Bilirubin Urine NEGATIVE NEGATIVE   Ketones, ur NEGATIVE NEGATIVE mg/dL   Protein, ur 100 (A) NEGATIVE mg/dL   Nitrite NEGATIVE NEGATIVE   Leukocytes,Ua NEGATIVE NEGATIVE   RBC / HPF 0-5 0 - 5  RBC/hpf   WBC, UA 0-5 0 - 5 WBC/hpf   Bacteria, UA RARE (A) NONE SEEN   Squamous Epithelial / LPF 0-5 0 - 5   Mucus PRESENT    Hyaline Casts, UA PRESENT   Lactic acid, plasma  Result Value Ref Range   Lactic Acid, Venous 1.9 0.5 - 1.9 mmol/L  Lactic acid, plasma  Result Value Ref Range   Lactic Acid, Venous 1.5 0.5 - 1.9 mmol/L  Magnesium  Result Value Ref Range   Magnesium 1.6 (L) 1.7 - 2.4 mg/dL  TSH  Result Value Ref Range   TSH 0.561 0.350 - 4.500 uIU/mL  Rapid urine drug screen (hospital performed)  Result Value Ref Range   Opiates NONE DETECTED NONE DETECTED   Cocaine NONE DETECTED NONE DETECTED   Benzodiazepines POSITIVE (A) NONE DETECTED   Amphetamines NONE DETECTED NONE DETECTED   Tetrahydrocannabinol NONE DETECTED NONE DETECTED   Barbiturates NONE DETECTED NONE DETECTED  CBG monitoring, ED  Result Value Ref Range   Glucose-Capillary 185 (H) 70 - 99 mg/dL  Troponin I (High Sensitivity)  Result Value Ref Range   Troponin I (High Sensitivity) 4 <18 ng/L  Troponin I (High  Sensitivity)  Result Value Ref Range   Troponin I (High Sensitivity) 5 <18 ng/L   DG Chest 2 View  Result Date: 12/10/2020 CLINICAL DATA:  History of kidney cancer. EXAM: CHEST - 2 VIEW COMPARISON:  Chest radiograph 09/22/2020 FINDINGS: The heart size and mediastinal contours are within normal limits. Both lungs are clear. No pleural effusion or pneumothorax. No acute osseous abnormality. Anterior cervical spinal fixation hardware. Intervertebral spacer is noted in the distal thoracic spine. IMPRESSION: No active cardiopulmonary disease.  Pain Electronically Signed   By: Ileana Roup M.D.   On: 12/10/2020 13:10   CT Angio Chest PE W and/or Wo Contrast  Result Date: 12/11/2020 CLINICAL DATA:  High probability PE.  Chest pain. EXAM: CT ANGIOGRAPHY CHEST WITH CONTRAST TECHNIQUE: Multidetector CT imaging of the chest was performed using the standard protocol during bolus administration of intravenous contrast. Multiplanar CT image reconstructions and MIPs were obtained to evaluate the vascular anatomy. CONTRAST:  57m OMNIPAQUE IOHEXOL 350 MG/ML SOLN COMPARISON:  Nuclear medicine pulmonary PE study 04/23/2020. CT angiogram chest 02/07/2020. FINDINGS: Cardiovascular: Satisfactory opacification of the pulmonary arteries to the segmental level. No evidence of pulmonary embolism. Normal heart size. No pericardial effusion. Mediastinum/Nodes: No enlarged mediastinal, hilar, or axillary lymph nodes. Thyroid gland, trachea, and esophagus demonstrate no significant findings. Lungs/Pleura: There is a 3 mm nodule in the left upper lobe image 10/43 which is unchanged from the prior exam. There is linear scarring or atelectasis in the left lower lobe. The lungs are otherwise clear. There is no pleural effusion or pneumothorax. Upper Abdomen: No acute abnormality. Musculoskeletal: Disc spacer noted at T8-T9.  No acute fractures. Review of the MIP images confirms the above findings. IMPRESSION: 1. No evidence for  pulmonary embolism. No acute cardiopulmonary process. 2. Pathology: 3 mm left solid pulmonary nodule within the upper lobe. If patient is low risk for malignancy, no routine follow-up imaging is recommended; if patient is high risk for malignancy, a non-contrast Chest CT at 12 months is optional. If performed and the nodule is stable at 12 months, no further follow-up is recommended. These guidelines do not apply to immunocompromised patients and patients with cancer. Follow up in patients with significant comorbidities as clinically warranted. For lung cancer screening, adhere to Lung-RADS guidelines. Reference: Radiology. 2017; 284(1):228-43. Electronically Signed  By: Ronney Asters M.D.   On: 12/11/2020 23:09   DG Chest Portable 1 View  Result Date: 12/11/2020 CLINICAL DATA:  Chest pain. EXAM: PORTABLE CHEST 1 VIEW COMPARISON:  Chest radiograph dated 12/08/2020. FINDINGS: No focal consolidation, pleural effusion, or pneumothorax. Stable cardiac silhouette. No acute osseous pathology. Lower cervical ACDF. IMPRESSION: No active disease. Electronically Signed   By: Anner Crete M.D.   On: 50/10/3816 29:93      Delora Fuel, MD 71/69/67 [phone]

## 2020-12-11 NOTE — ED Provider Notes (Signed)
Emergency Medicine Provider Triage Evaluation Note  Kenneth Osborn , a 65 y.o. male  was evaluated in triage.  Pt complains of hyperkalemia. Patient called by his doctor to report to the ED due to hyperkalemia. Patient endorses intermittent chest pain that started today and some diffuse abdominal tenderness. Patient also endorses increased urinary frequency.   Review of Systems  Positive: Cp, abdominal pain, urinary frequency Negative: fever  Physical Exam  There were no vitals taken for this visit. Gen:   Awake, no distress   Resp:  Normal effort  MSK:   Moves extremities without difficulty  Other:  Diffuse abdominal tenderness  Medical Decision Making  Medically screening exam initiated at 8:13 PM.  Appropriate orders placed.  Kenneth Osborn was informed that the remainder of the evaluation will be completed by another provider, this initial triage assessment does not replace that evaluation, and the importance of remaining in the ED until their evaluation is complete.  Labs CXR EKG Patient tachycardic in triage. Informed charge need for room   Karie Kirks 12/11/20 2015    Noemi Chapel, MD 12/11/20 [phone]

## 2020-12-11 NOTE — ED Triage Notes (Signed)
Patient states that he had labs done at [facility] on Friday. Patient states his doctor called and told him his potassium was high and to come be seen. Patient states he has also had episodes of incontinence and some intermittent chest pain.

## 2020-12-12 DIAGNOSIS — R0789 Other chest pain: Secondary | ICD-10-CM | POA: Diagnosis not present

## 2020-12-12 LAB — URINE CULTURE: Culture: <10000 — AB

## 2020-12-12 LAB — RESP PANEL BY RT-PCR (FLU A&B, COVID) ARPGX2
Influenza A by PCR: NEGATIVE
Influenza B by PCR: NEGATIVE
SARS Coronavirus 2 by RT PCR: NEGATIVE

## 2020-12-12 LAB — T4, FREE: Free T4: 0.72 ng/dL (ref 0.61–1.12)

## 2020-12-12 NOTE — Discharge Instructions (Signed)
Your labs in the emergency department only showed a slightly low magnesium, and you have been given magnesium supplementation.  Please follow-up with your urologist regarding your inability to control your urine.  Return if you are having any new or concerning symptoms.

## 2020-12-17 LAB — CULTURE, BLOOD (ROUTINE X 2)
Culture: NO GROWTH
Culture: NO GROWTH
Special Requests: ADEQUATE
Special Requests: ADEQUATE

## 2020-12-17 NOTE — Progress Notes (Signed)
PROVIDER NOTE: Information contained herein reflects review and annotations entered in association with encounter. Interpretation of such information and data should be left to medically-trained personnel. Information provided to patient can be located elsewhere in the medical record under "Patient Instructions". Document created using STT-dictation technology, any transcriptional errors that may result from process are unintentional.    Patient: Kenneth Osborn  Service Category: E/M  Provider: Gaspar Cola, MD  DOB: 01-17-1956  DOS: 12/18/2020  Specialty: Interventional Pain Management  MRN: [id number]  Setting: Ambulatory outpatient  PCP: Kenneth Pink, MD  Type: Established Patient    Referring Provider: [name], MD  Location: Office  Delivery: Face-to-face     HPI  [name], a 65 y.o. year old male, is here today because of his Facet syndrome, lumbar [M47.816]. [name] primary complain today is Back Pain (lower) Last encounter: My last encounter with him was on 09/20/2020. 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 Beverely Pace at [facility] on 01/04/2013); Neurogenic pain; Thoracic facet syndrome (T8-10); Lumbar facet syndrome (Bilateral) (R>L); Cervical facet syndrome (Right); Cervical spondylosis; Lumbar spondylosis; Chronic upper extremity pain (Left); Chronic cervical radicular pain (Left); Chronic upper back pain; History of thoracic spine surgery (S/P T9-10 IVD spacer); Failed back surgical syndrome; Chronic musculoskeletal pain; Myofascial pain; Chronic lower extremity pain (Left); Chronic radicular lumbar pain (left L4 dermatomal pain); Arthralgia of shoulder; Chronic tension-type headache, not intractable; 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); Chronic pain  syndrome; Headache disorder; Trigger point posterior superior iliac spine (PSIS) (Right); 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 Pain Assessment: Severity of Chronic pain is reported as a 4 /10. Location: Back Right, Left, Lower/Pain radiaties down both feet. Onset: More than a month ago. Quality: Aching, Burning, Tingling, Numbness, Sharp, Shooting, Constant. Timing: Constant. Modifying factor(s): sitting down , heating pad, laying downs, meds. Vitals:  height is _0  (1.753 m) and weight is 196 lb (88.9 kg). His temporal temperature is 97.1 F (36.2 C) (abnormal). His blood pressure is 137/80 and his pulse is 92. His respiration is 16 and oxygen saturation is 100%.   Reason for encounter: medication management.   The patient indicates doing well with the current medication regimen. No adverse reactions or side effects reported to the medications.   In addition, the patient indicates that his low back pain is slowly returning and he would like to have the radiofrequency ablation repeated.  He refers that he usually gets approximately 1year of benefit from the radiofrequency.  The last time he had the left side done was on 01/04/2020 and the last time the right side was done was on 12/02/2019.  RTCB: 03/20/2021 Nonopioids transferred 11/22/2019: Magnesium, Flexeril, and Neurontin.  Pharmacotherapy Assessment  Analgesic: Oxycodone IR 5 mg, 1 tab p.o. every 4 hours (30 mg/day of oxycodone) MME/day: 45 mg/day.    Monitoring: [place] PMP: PDMP reviewed during this encounter.       Pharmacotherapy: No side-effects or adverse reactions reported. Compliance:  No problems identified. Effectiveness: Clinically acceptable.  Chauncey Fischer, RN  12/18/2020  8:53 AM  Sign when Signing Visit Nursing Pain Medication Assessment:  Safety precautions to be maintained throughout the outpatient stay will include: orient to surroundings, keep bed in low position, maintain call bell within reach at all times, provide assistance with transfer out of bed and ambulation.  Medication Inspection Compliance: Pill count conducted under aseptic conditions, in front of the patient. Neither the pills nor the bottle was removed from the patient's sight at any time. Once count was completed pills were immediately returned to the patient in their original bottle.  Medication: Oxycodone IR Pill/Patch Count:  175 of 180 pills remain Pill/Patch Appearance: Markings consistent with prescribed medication Bottle Appearance: Standard pharmacy container. Clearly labeled. Filled Date: 55 / 28 / 2022 Last Medication intake:  Today Safety precautions to be maintained throughout the outpatient stay will include: orient to surroundings, keep bed in low position, maintain call bell within reach at all times, provide assistance with transfer out of bed and ambulation.      UDS:  Summary  Date Value Ref Range Status  06/19/2020 Note  Final    Comment:    ==================================================================== [employer] 13 (MW) ==================================================================== Test                             Result       Flag       Units  Drug Present and Declared for Prescription Verification   Desmethyldiazepam              101          EXPECTED   ng/mg creat   Oxazepam                       713          EXPECTED   ng/mg creat   Temazepam                      259          EXPECTED   ng/mg creat     Desmethyldiazepam, oxazepam, and temazepam are expected metabolites    of diazepam. Desmethyldiazepam and oxazepam are also expected    metabolites of other drugs, including chlordiazepoxide, prazepam,    clorazepate, and halazepam. Oxazepam is an expected metabolite of    temazepam. Oxazepam and temazepam are also available as scheduled    prescription medications.    Alprazolam                     32           EXPECTED   ng/mg creat   Alpha-hydroxyalprazolam        24           EXPECTED   ng/mg creat    Source of alprazolam is a scheduled prescription medication. Alpha-    hydroxyalprazolam is an expected metabolite of alprazolam.    Oxycodone                      1138         EXPECTED   ng/mg creat   Oxymorphone                    2681         EXPECTED   ng/mg creat  Noroxycodone                   637          EXPECTED   ng/mg creat   Noroxymorphone                 435          EXPECTED   ng/mg creat    Sources of oxycodone are scheduled prescription medications.    Oxymorphone, noroxycodone, and noroxymorphone are expected    metabolites of oxycodone. Oxymorphone is also available as a    scheduled prescription medication.  ==================================================================== Test                      Result    Flag   Units      Ref Range   Creatinine              104              mg/dL      >=20 ==================================================================== Declared Medications:  The flagging and interpretation on this report are based on the  following declared medications.  Unexpected results may arise from  inaccuracies in the declared medications.   **Note: The testing scope of this panel includes these medications:   Alprazolam (Xanax)  Diazepam (Valium)  Oxycodone   **Note: The testing scope of this panel does not include the  following reported medications:   Aspirin  Cetirizine (Zyrtec)  Cyclobenzaprine (Flexeril)  Diltiazem (Cardizem)   Esomeprazole (Nexium)  Ezetimibe (Zetia)  Gabapentin (Neurontin)  Glucagon  Insulin  Magnesium (Mag-Ox)  Meclizine (Antivert)  Metoprolol (Toprol)  Montelukast (Singulair)  Niacin  Polyethylene Glycol (MiraLAX)  Prasugrel (Effient)  Rosuvastatin (Crestor)  Sennosides (Senokot) ==================================================================== For clinical consultation, please call [phone]. ====================================================================      ROS  Constitutional: Denies any fever or chills Gastrointestinal: No reported hemesis, hematochezia, vomiting, or acute GI distress Musculoskeletal: Denies any acute onset joint swelling, redness, loss of ROM, or weakness Neurological: No reported episodes of acute onset apraxia, aphasia, dysarthria, agnosia, amnesia, paralysis, loss of coordination, or loss of consciousness  Medication Review  ALPRAZolam, Glucagon, Magnesium Oxide, aspirin EC, cetirizine, cyclobenzaprine, diazepam, diltiazem, enalapril, esomeprazole, ezetimibe, gabapentin, insulin regular human CONCENTRATED, meclizine, metFORMIN, metoprolol succinate, montelukast, niacin, nitroGLYCERIN, oxyCODONE, polyethylene glycol, prasugrel, rosuvastatin, and senna-docusate  History Review  Allergy: Kenneth Osborn has No Known Allergies. Drug: Kenneth Osborn  reports no history of drug use. Alcohol:  reports no history of alcohol use. Tobacco:  reports that he has never smoked. He has never used smokeless tobacco. Social: Kenneth Osborn  reports that he has never smoked. He has never used smokeless tobacco. He reports that he does not drink alcohol and does not use drugs. Medical:  has a past medical history of Acute postoperative pain (12/03/2018), Allergic rhinitis (12/30/2012), Anginal pain ([place]), Anxiety, Bronchitis, Can't get food down (08/12/2014), Cancer ([place]) (01/2020), Chronic back pain, Concussion (09/2015), Coronary artery disease, DDD (degenerative disc disease),  cervical, Dehydration symptoms, Diabetes mellitus without complication ([place]), Dysphagia, GERD (gastroesophageal reflux disease), History of Meniere's disease (12/21/2014), History of thoracic spine surgery (S/P T9-10 IVD spacer) (12/21/2014), Hypercholesteremia, Hyperlipidemia, Hypertension, Meniere's disease, Myocardial infarction ([place]) (2012), Neuromuscular disorder ([place]), Severe sepsis ([place]) (02/07/2020), Short-segment Barrett's esophagus, and Sleep apnea. Surgical: Kenneth Osborn  has a past surgical history that includes Cardiovascular stress test; Cardiac catheterization; Labrinthectomy; mastoid shunt (Bilateral); Back surgery; Shoulder arthroscopy with subacromial decompression (Left, 04/06/2012); ARTHRODESIS ANTERIOR ANTERIOR CERVICLE  SPINE (01/04/2013); Colonoscopy with propofol (N/A, 09/19/2014); Esophagogastroduodenoscopy (N/A, 09/19/2014); Savory dilation (N/A, 09/19/2014); Spinal cord stimulator implant (Right); Cardiac catheterization (N/A, 06/15/2015); Cardiac catheterization (N/A, 06/15/2015); Pulse generator implant (N/A, 01/18/2019); Lumbar spinal cord simulator lead removal (Right, 08/09/2019); IR Perc Cholecystostomy (02/09/2020); IR Radiologist Eval & Mgmt (03/14/2020); Coronary angioplasty; Appendectomy; and Robot assisted laparoscopic nephrectomy (Left, 04/20/2020). Family: family history includes Cancer in his sister; Diabetes in his maternal grandmother, mother, and paternal grandmother; Heart disease in his father, maternal aunt, maternal uncle, and mother.  Laboratory Chemistry Profile   Renal Lab Results  Component Value Date   BUN 21 12/11/2020   CREATININE 1.49 (H) 12/11/2020   GFRAA 58 (L) 01/08/2019   GFRNONAA 51 (L) 12/11/2020    Hepatic Lab Results  Component Value Date   AST 18 12/11/2020   ALT 17 12/11/2020   ALBUMIN 4.6 12/11/2020   ALKPHOS 92 12/11/2020   HCVAB NON REACTIVE 02/08/2020   LIPASE 28 12/11/2020   AMMONIA 24 02/07/2020    Electrolytes Lab Results  Component  Value Date   NA 138 12/11/2020   K 4.7 12/11/2020   CL 104 12/11/2020   CALCIUM 10.6 (H) 12/11/2020   MG 1.6 (L) 12/11/2020   PHOS 3.1 11/09/2020    Bone No results found for: VD25OH, VD125OH2TOT, XK4818HU3, JS9702OV7, 25OHVITD1, 25OHVITD2, 25OHVITD3, TESTOFREE, TESTOSTERONE  Inflammation (CRP: Acute Phase) (ESR: Chronic Phase) Lab Results  Component Value Date   CRP 1.4 (H) 03/22/2015   ESRSEDRATE 9 03/22/2015   LATICACIDVEN 1.5 12/11/2020         Note: Above Lab results reviewed.  Recent Imaging Review  CT Angio Chest PE W and/or Wo Contrast CLINICAL DATA:  High probability PE.  Chest pain.  EXAM: CT ANGIOGRAPHY CHEST WITH CONTRAST  TECHNIQUE: Multidetector CT imaging of the chest was performed using the standard protocol during bolus administration of intravenous contrast. Multiplanar CT image reconstructions and MIPs were obtained to evaluate the vascular anatomy.  CONTRAST:  39m OMNIPAQUE IOHEXOL 350 MG/ML SOLN  COMPARISON:  Nuclear medicine pulmonary PE study 04/23/2020. CT angiogram chest 02/07/2020.  FINDINGS: Cardiovascular: Satisfactory opacification of the pulmonary arteries to the segmental level. No evidence of pulmonary embolism. Normal heart size. No pericardial effusion.  Mediastinum/Nodes: No enlarged mediastinal, hilar, or axillary lymph nodes. Thyroid gland, trachea, and esophagus demonstrate no significant findings.  Lungs/Pleura: There is a 3 mm nodule in the left upper lobe image 10/43 which is unchanged from the prior exam. There is linear scarring or atelectasis in the left lower lobe. The lungs are otherwise clear. There is no pleural effusion or pneumothorax.  Upper Abdomen: No acute abnormality.  Musculoskeletal: Disc spacer noted at T8-T9.  No acute fractures.  Review of the MIP images confirms the above findings.  IMPRESSION: 1. No evidence for pulmonary embolism. No acute cardiopulmonary process. 2. Pathology: 3 mm left solid  pulmonary nodule within the upper lobe. If patient is low risk for malignancy, no routine follow-up imaging is recommended; if patient is high risk for malignancy, a non-contrast Chest CT at 12 months is optional. If performed and the nodule is stable at 12 months, no further follow-up is recommended. These guidelines do not apply to immunocompromised patients and patients with cancer. Follow up in patients with significant comorbidities as clinically warranted. For lung cancer screening, adhere to Lung-RADS guidelines. Reference: Radiology. 2017; 284(1):228-43.  Electronically Signed   By: ARonney AstersM.D.   On: 12/11/2020 23:09 DG Chest Portable 1 View CLINICAL DATA:  Chest pain.  EXAM: PORTABLE CHEST 1 VIEW  COMPARISON:  Chest radiograph dated 12/08/2020.  FINDINGS: No focal consolidation, pleural effusion, or pneumothorax. Stable cardiac silhouette. No acute osseous pathology. Lower cervical ACDF.  IMPRESSION: No active disease.  Electronically Signed   By: Anner Crete M.D.   On: 12/11/2020 20:51 Note: Reviewed        Physical Exam  General appearance: Well nourished, well developed, and well hydrated. In no apparent acute distress Mental status: Alert, oriented x 3 (person, place, & time)       Respiratory: No evidence of acute respiratory distress Eyes: PERLA Vitals: BP 137/80 (BP Location: Right Arm, Patient Position: Sitting, Cuff Size: Large)   Pulse 92   Temp (!) 97.1 F (36.2 C) (Temporal)   Resp 16   Ht 5' 9"  (1.753 m)   Wt 196 lb (88.9 kg)   SpO2 100%   BMI 28.94 kg/m  BMI: Estimated body mass index is 28.94 kg/m as calculated from the following:   Height as of this encounter: 5' 9"  (1.753 m).   Weight as of this encounter: 196 lb (88.9 kg). Ideal: Ideal body weight: 70.7 kg (155 lb 13.8 oz) Adjusted ideal body weight: 78 kg (171 lb 14.7 oz)  Assessment   Status Diagnosis  Controlled Controlled Controlled 1. Lumbar facet syndrome  (Bilateral) (R>L)   2. Chronic low back pain (Bilateral) w/o sciatica   3. DDD (degenerative disc disease), lumbosacral   4. Failed back surgical syndrome   5. 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 Beverely Pace at [facility] on 01/04/2013)   6. Chronic lower extremity pain (Left)   7. 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 Anxiety due to invasive procedure   9. Chronic pain syndrome   10. Pharmacologic therapy   11. Chronic anticoagulation (Effient)   12. Chronic use of opiate for therapeutic purpose   13. Encounter for medication management      Updated Problems: No problems updated.  Plan of Care  Problem-specific:  No problem-specific Assessment & Plan notes found for this encounter.  Kenneth Osborn has a current medication list which includes the following long-term medication(s): cetirizine, cyclobenzaprine, esomeprazole, ezetimibe, gabapentin, humulin r u-500 kwikpen, magnesium oxide, metoprolol succinate, montelukast, niacin, [START ON 12/20/2020] oxycodone, [START ON 01/19/2021] oxycodone, and [START ON 02/18/2021] oxycodone.  Pharmacotherapy (Medications Ordered): Meds ordered this encounter  Medications   oxyCODONE (OXY IR/ROXICODONE) 5 MG immediate release tablet    Sig: Take 1 tablet (5 mg total) by mouth every 4 (four) hours as needed for severe pain. Must last 30 days.    Dispense:  180 tablet    Refill:  0    DO NOT: delete (not duplicate); no partial-fill (will deny script to complete), no refill request (F/U required). DISPENSE: 1 day early if closed on fill date. WARN: No CNS-depressants within 8 hrs of med.   oxyCODONE (OXY IR/ROXICODONE) 5 MG immediate release tablet    Sig: Take 1 tablet (5 mg total) by mouth every 4 (four) hours as needed for severe pain. Must last 30 days.    Dispense:  180 tablet    Refill:  0    DO NOT: delete (not duplicate); no partial-fill (will deny script to complete), no refill  request (F/U required). DISPENSE: 1 day early if closed on fill date. WARN: No CNS-depressants within 8 hrs of med.   oxyCODONE (OXY IR/ROXICODONE) 5 MG immediate release tablet    Sig: Take 1  tablet (5 mg total) by mouth every 4 (four) hours as needed for severe pain. Must last 30 days.    Dispense:  180 tablet    Refill:  0    DO NOT: delete (not duplicate); no partial-fill (will deny script to complete), no refill request (F/U required). DISPENSE: 1 day early if closed on fill date. WARN: No CNS-depressants within 8 hrs of med.   diazepam (VALIUM) 10 MG tablet    Sig: Take 1 tablet (10 mg total) by mouth 60 (sixty) minutes before procedure for 1 dose. Take with a sip of water, on an empty stomach. Do not eat anything for 6 hours prior to procedure.    Dispense:  1 tablet    Refill:  0    Must have a driver. Do not drive or operate machinery x 24 hours after taking this medication. Avoid taking within 4 hours of having taken an opioid pain medications.    Orders:  Orders Placed This Encounter  Procedures   Radiofrequency,Lumbar    Standing Status:   Future    Standing Expiration Date:   03/20/2021    Scheduling Instructions:     Side(s): Left-sided     Level: L3-4, L4-5, & L5-S1 Facets (L2, L3, L4, L5, & S1 Medial Branch Nerves)     Sedation: Patient's choice.     Scheduling Timeframe: As soon as pre-approved    Order Specific Question:   Where will this procedure be performed?    Answer:   [facility]   Informed Consent Details: Physician/Practitioner Attestation; Transcribe to consent form and obtain patient signature    Nursing Order: Transcribe to consent form and obtain patient signature. Note: Always confirm laterality of pain with Mr. Tapp, before procedure.    Order Specific Question:   Physician/Practitioner attestation of informed consent for procedure/surgical case    Answer:   I, the physician/practitioner, attest that I have discussed with the patient the  benefits, risks, side effects, alternatives, likelihood of achieving goals and potential problems during recovery for the procedure that I have provided informed consent.    Order Specific Question:   Procedure    Answer:   Lumbar Facet Radiofrequency Ablation    Order Specific Question:   Physician/Practitioner performing the procedure    Answer:   Chanika Byland A. Dossie Arbour, MD    Order Specific Question:   Indication/Reason    Answer:   Low Back Pain, with our without leg pain, due to Facet Joint Arthralgia (Joint Pain) known as Lumbar Facet Syndrome, secondary to Lumbar, and/or Lumbosacral Spondylosis (Arthritis of the Spine), without myelopathy or radiculopathy (Nerve Damage).   Blood Thinner Instructions to Nursing    Always make sure patient has clearance from prescribing physician to stop blood thinners for interventional therapies. If the patient requires a Lovenox-bridge therapy, make sure arrangements are made to institute it with the assistance of the PCP.    Scheduling Instructions:     Have Mr. Hoey stop the Effient (Prasugrel) x 10 days prior to procedure or surgery.    Follow-up plan:   Return for (64mn), (Clinic) procedure: (L) L-FCT RFA, ValiumRx-given.     Interventional Therapies  Risk  Complexity Considerations:   Estimated body mass index is 28.94 kg/m as calculated from the following:   Height as of this encounter: _0  (1.753 m).   Weight as of this encounter: 196 lb (88.9 kg). NOTE: EFFIENT Anticoagulation (Stop: 7-10 days  Re-start: 6 hrs)   Planned  Pending:  Palliative left lumbar facet RFA #3    Under consideration:   Therapeutic/palliative bilateral lumbar facet RFA    Completed:   Palliative right PSIS MNB/TPI x3 (11/13/2017)  Diagnostic/palliative right lumbar facet MBB x2 (09/22/2018)  Diagnostic/palliative left lumbar facet MBB x2 (09/22/2018)  Palliative left lumbar facet RFA x2 (01/04/2020)  Palliative right lumbar facet RFA x2 (12/02/2019)     Therapeutic  Palliative (PRN) options:   Palliative right PSIS MNB/TPI  Diagnostic/palliative right lumbar facet block  Diagnostic/palliative left lumbar facet block  Palliative left lumbar facet RFA  Palliative right lumbar facet RFA     Recent Visits Date Type Provider Dept  09/20/20 Office Visit Milinda Pointer, MD [facility]  Showing recent visits within past 90 days and meeting all other requirements Today's Visits Date Type Provider Dept  12/18/20 Office Visit Milinda Pointer, MD [facility]  Showing today's visits and meeting all other requirements Future Appointments No visits were found meeting these conditions. Showing future appointments within next 90 days and meeting all other requirements I discussed the assessment and treatment plan with the patient. The patient was provided an opportunity to ask questions and all were answered. The patient agreed with the plan and demonstrated an understanding of the instructions.  Patient advised to call back or seek an in-person evaluation if the symptoms or condition worsens.  Duration of encounter: 30 minutes.  Note by: Kenneth Cola, MD Date: 12/18/2020; Time: 9:25 AM

## 2020-12-18 ENCOUNTER — Other Ambulatory Visit: Payer: Self-pay

## 2020-12-18 ENCOUNTER — Encounter: Payer: Self-pay | Admitting: Pain Medicine

## 2020-12-18 ENCOUNTER — Ambulatory Visit: Payer: Medicare Other | Attending: Pain Medicine | Admitting: Pain Medicine

## 2020-12-18 VITALS — BP 137/80 | HR 92 | Temp 97.1°F | Resp 16 | Ht 69.0 in | Wt 196.0 lb

## 2020-12-18 DIAGNOSIS — Z79899 Other long term (current) drug therapy: Secondary | ICD-10-CM | POA: Insufficient documentation

## 2020-12-18 DIAGNOSIS — Z79891 Long term (current) use of opiate analgesic: Secondary | ICD-10-CM | POA: Insufficient documentation

## 2020-12-18 DIAGNOSIS — G894 Chronic pain syndrome: Secondary | ICD-10-CM | POA: Insufficient documentation

## 2020-12-18 DIAGNOSIS — M961 Postlaminectomy syndrome, not elsewhere classified: Secondary | ICD-10-CM | POA: Insufficient documentation

## 2020-12-18 DIAGNOSIS — M79605 Pain in left leg: Secondary | ICD-10-CM | POA: Insufficient documentation

## 2020-12-18 DIAGNOSIS — F419 Anxiety disorder, unspecified: Secondary | ICD-10-CM | POA: Insufficient documentation

## 2020-12-18 DIAGNOSIS — Z7901 Long term (current) use of anticoagulants: Secondary | ICD-10-CM | POA: Diagnosis present

## 2020-12-18 DIAGNOSIS — D49512 Neoplasm of unspecified behavior of left kidney: Secondary | ICD-10-CM | POA: Diagnosis present

## 2020-12-18 DIAGNOSIS — G8929 Other chronic pain: Secondary | ICD-10-CM | POA: Diagnosis present

## 2020-12-18 DIAGNOSIS — M545 Low back pain, unspecified: Secondary | ICD-10-CM | POA: Diagnosis present

## 2020-12-18 DIAGNOSIS — M5137 Other intervertebral disc degeneration, lumbosacral region: Secondary | ICD-10-CM | POA: Insufficient documentation

## 2020-12-18 DIAGNOSIS — M47816 Spondylosis without myelopathy or radiculopathy, lumbar region: Secondary | ICD-10-CM | POA: Diagnosis present

## 2020-12-18 MED ORDER — OXYCODONE HCL 5 MG PO TABS: 5.0000 mg | ORAL_TABLET | ORAL | 0 refills | Status: DC | PRN

## 2020-12-18 MED ORDER — DIAZEPAM 10 MG PO TABS: 10.0000 mg | ORAL_TABLET | ORAL | 0 refills | Status: DC

## 2020-12-18 NOTE — Progress Notes (Signed)
Nursing Pain Medication Assessment:  Safety precautions to be maintained throughout the outpatient stay will include: orient to surroundings, keep bed in low position, maintain call bell within reach at all times, provide assistance with transfer out of bed and ambulation.  Medication Inspection Compliance: Pill count conducted under aseptic conditions, in front of the patient. Neither the pills nor the bottle was removed from the patient's sight at any time. Once count was completed pills were immediately returned to the patient in their original bottle.  Medication: Oxycodone IR Pill/Patch Count:  175 of 180 pills remain Pill/Patch Appearance: Markings consistent with prescribed medication Bottle Appearance: Standard pharmacy container. Clearly labeled. Filled Date: 27 / 28 / 2022 Last Medication intake:  Today Safety precautions to be maintained throughout the outpatient stay will include: orient to surroundings, keep bed in low position, maintain call bell within reach at all times, provide assistance with transfer out of bed and ambulation.

## 2020-12-18 NOTE — Patient Instructions (Signed)
______________________________________________________________________  Preparing for Procedure with Oral Anxiolytics  Definition: Anxiolytics - Medications that provide muscle relaxation and decrease anxiety.  Procedure appointments are limited to planned procedures: No Prescription Refills. No disability issues will be discussed. No medication changes will be discussed.  Instructions: Oral Intake: Do not eat or drink anything for at least 6 hours prior to your procedure. (Exception: Blood Pressure Medication. See below.)  Anxiolytic Medication: This medication is meant to relax you during your procedure. DO NOT DRIVE OR OPERATE DANGEROUS MACHINERY WHILE UNDER THE INFLUENCE OF THIS MEDICATION. Take the oral medication (i.e.: Valium) as prescribed on the day of your procedure with just a sip of water. Prescription will be sent to your pharmacy, prior to the day of your procedure. Topical anesthetic: Your physician might have recommended a topical anesthetic/analgesic to apply over the area where the procedure will be done. If so, apply to area 1 hour prior to procedure. For exact location of application, ask your healthcare provider.    Transportation: A driver is required. You may not drive yourself after the procedure. Blood Pressure Medicine: Do not forget to take your blood pressure medicine with a sip of water the morning of the procedure. If your Diastolic (lower reading) is above 100 mmHg, elective cases will be cancelled/rescheduled. Blood thinners: These will need to be stopped for procedures. Notify our staff if you are taking any blood thinners. Depending on which one you take, there will be specific instructions on how and when to stop it. Diabetics on insulin: Notify the staff so that you can be scheduled 1st case in the morning. If your diabetes requires high dose insulin, take only  of your normal insulin dose the morning of the procedure and notify the staff that you have done  so. Preventing infections: Shower with an antibacterial soap the morning of your procedure. Build-up your immune system: Take 1000 mg of Vitamin C with every meal (3 times a day) the day prior to your procedure. Antibiotics: Inform the staff if you have a condition or reason that requires you to take antibiotics before dental procedures. Pregnancy: If you are pregnant, call and cancel the procedure. Sickness: If you have a cold, fever, or any active infections, call and cancel the procedure. Arrival: You must be in the facility at least 30 minutes prior to your scheduled procedure. Children: Do not bring children with you. Dress appropriately: Bring dark clothing that you would not mind if they get stained. Valuables: Do not bring any jewelry or valuables.  Reasons to call and reschedule or cancel your procedure:  NOTE: Following these recommendations will minimize the risk of a serious complication. Surgeries: Avoid having procedures within 2 weeks of any surgery. (Avoid for 2 weeks before or after any surgery). Flu Shots: Avoid having procedures within 2 weeks of a flu shots. (Avoid for 2 weeks before or after immunizations). Barium: Avoid having a procedure within 7-10 days after having had a radiological study involving the use of radiological contrast. (Myelograms, Barium swallow or enema study). Heart attacks: Avoid any elective procedures or surgeries for the initial 6 months after a "Myocardial Infarction" (Heart Attack). Blood thinners: It is imperative that you stop these medications before procedures. Let us know if you if you take any blood thinner.  Infection: Avoid procedures during or within two weeks of an infection (including chest colds or gastrointestinal problems). Symptoms associated with infections include: Localized redness, fever, chills, night sweats or profuse sweating, burning sensation when voiding, cough, congestion,  stuffiness, runny nose, sore throat, diarrhea,  nausea, vomiting, cold or Flu symptoms, recent or current infections. It is especially important if the infection is over the area that we intend to treat. Heart and lung problems: Symptoms that may suggest an active cardiopulmonary problem include: cough, chest pain, breathing difficulties or shortness of breath, dizziness, ankle swelling, uncontrolled high or unusually low blood pressure, and/or palpitations. If you are experiencing any of these symptoms, cancel your procedure and contact your primary care physician for an evaluation.  Remember:  Regular Business hours are:  Monday to Thursday 8:00 AM to 4:00 PM  Provider's Schedule: Milinda Pointer, MD:  Procedure days: Tuesday and Thursday 7:30 AM to 4:00 PM  Gillis Santa, MD:  Procedure days: Monday and Wednesday 7:30 AM to 4:00 PM ______________________________________________________________________  ____________________________________________________________________________________________  Blood Thinners  IMPORTANT NOTICE:  If you take any of these, make sure to notify the nursing staff.  Failure to do so may result in injury.  Recommended time intervals to stop and restart blood-thinners, before & after invasive procedures  Generic Name Brand Name Pre-procedure. Stop this long before procedure. Post-procedure. Minimum waiting period before restarting.  Abciximab Reopro 15 days 2 hrs  Alteplase Activase 10 days 10 days  Anagrelide Agrylin    Apixaban Eliquis 3 days 6 hrs  Cilostazol Pletal 3 days 5 hrs  Clopidogrel Plavix 7-10 days 2 hrs  Dabigatran Pradaxa 5 days 6 hrs  Dalteparin Fragmin 24 hours 4 hrs  Dipyridamole Aggrenox 11days 2 hrs  Edoxaban Lixiana; Savaysa 3 days 2 hrs  Enoxaparin  Lovenox 24 hours 4 hrs  Eptifibatide Integrillin 8 hours 2 hrs  Fondaparinux  Arixtra 72 hours 12 hrs  Hydroxychloroquine Plaquenil 11 days   Prasugrel Effient 7-10 days 6 hrs  Reteplase Retavase 10 days 10 days  Rivaroxaban  Xarelto 3 days 6 hrs  Ticagrelor Brilinta 5-7 days 6 hrs  Ticlopidine Ticlid 10-14 days 2 hrs  Tinzaparin Innohep 24 hours 4 hrs  Tirofiban Aggrastat 8 hours 2 hrs  Warfarin Coumadin 5 days 2 hrs   Other medications with blood-thinning effects  Product indications Generic (Brand) names Note  Cholesterol Lipitor Stop 4 days before procedure  Blood thinner (injectable) Heparin (LMW or LMWH Heparin) Stop 24 hours before procedure  Cancer Ibrutinib (Imbruvica) Stop 7 days before procedure  Malaria/Rheumatoid Hydroxychloroquine (Plaquenil) Stop 11 days before procedure  Thrombolytics  10 days before or after procedures   Over-the-counter (OTC) Products with blood-thinning effects  Product Common names Stop Time  Aspirin > 325 mg Goody Powders, Excedrin, etc. 11 days  Aspirin ? 81 mg  7 days  Fish oil  4 days  Garlic supplements  7 days  Ginkgo biloba  36 hours  Ginseng  24 hours  NSAIDs Ibuprofen, Naprosyn, etc. 3 days  Vitamin E  4 days   ____________________________________________________________________________________________ ____________________________________________________________________________________________  General Risks and Possible Complications  Patient Responsibilities: It is important that you read this as it is part of your informed consent. It is our duty to inform you of the risks and possible complications associated with treatments offered to you. It is your responsibility as a patient to read this and to ask questions about anything that is not clear or that you believe was not covered in this document.  Patient's Rights: You have the right to refuse treatment. You also have the right to change your mind, even after initially having agreed to have the treatment done. However, under this last option, if you wait until the  last second to change your mind, you may be charged for the materials used up to that point.  Introduction: Medicine is not an Chief Strategy Officer.  Everything in Medicine, including the lack of treatment(s), carries the potential for danger, harm, or loss (which is by definition: Risk). In Medicine, a complication is a secondary problem, condition, or disease that can aggravate an already existing one. All treatments carry the risk of possible complications. The fact that a side effects or complications occurs, does not imply that the treatment was conducted incorrectly. It must be clearly understood that these can happen even when everything is done following the highest safety standards.  No treatment: You can choose not to proceed with the proposed treatment alternative. The "PRO(s)" would include: avoiding the risk of complications associated with the therapy. The "CON(s)" would include: not getting any of the treatment benefits. These benefits fall under one of three categories: diagnostic; therapeutic; and/or palliative. Diagnostic benefits include: getting information which can ultimately lead to improvement of the disease or symptom(s). Therapeutic benefits are those associated with the successful treatment of the disease. Finally, palliative benefits are those related to the decrease of the primary symptoms, without necessarily curing the condition (example: decreasing the pain from a flare-up of a chronic condition, such as incurable terminal cancer).  General Risks and Complications: These are associated to most interventional treatments. They can occur alone, or in combination. They fall under one of the following six (6) categories: no benefit or worsening of symptoms; bleeding; infection; nerve damage; allergic reactions; and/or death. No benefits or worsening of symptoms: In Medicine there are no guarantees, only probabilities. No healthcare provider can ever guarantee that a medical treatment will work, they can only state the probability that it may. Furthermore, there is always the possibility that the condition may worsen, either  directly, or indirectly, as a consequence of the treatment. Bleeding: This is more common if the patient is taking a blood thinner, either prescription or over the counter (example: Goody Powders, Fish oil, Aspirin, Garlic, etc.), or if suffering a condition associated with impaired coagulation (example: Hemophilia, cirrhosis of the liver, low platelet counts, etc.). However, even if you do not have one on these, it can still happen. If you have any of these conditions, or take one of these drugs, make sure to notify your treating physician. Infection: This is more common in patients with a compromised immune system, either due to disease (example: diabetes, cancer, human immunodeficiency virus [HIV], etc.), or due to medications or treatments (example: therapies used to treat cancer and rheumatological diseases). However, even if you do not have one on these, it can still happen. If you have any of these conditions, or take one of these drugs, make sure to notify your treating physician. Nerve Damage: This is more common when the treatment is an invasive one, but it can also happen with the use of medications, such as those used in the treatment of cancer. The damage can occur to small secondary nerves, or to large primary ones, such as those in the spinal cord and brain. This damage may be temporary or permanent and it may lead to impairments that can range from temporary numbness to permanent paralysis and/or brain death. Allergic Reactions: Any time a substance or material comes in contact with our body, there is the possibility of an allergic reaction. These can range from a mild skin rash (contact dermatitis) to a severe systemic reaction (anaphylactic reaction), which can result in death. Death:  In general, any medical intervention can result in death, most of the time due to an unforeseen complication. ____________________________________________________________________________________________

## 2020-12-25 DIAGNOSIS — N189 Chronic kidney disease, unspecified: Secondary | ICD-10-CM | POA: Insufficient documentation

## 2020-12-25 DIAGNOSIS — E1122 Type 2 diabetes mellitus with diabetic chronic kidney disease: Secondary | ICD-10-CM | POA: Insufficient documentation

## 2020-12-25 DIAGNOSIS — C642 Malignant neoplasm of left kidney, except renal pelvis: Secondary | ICD-10-CM | POA: Insufficient documentation

## 2020-12-25 DIAGNOSIS — D631 Anemia in chronic kidney disease: Secondary | ICD-10-CM | POA: Insufficient documentation

## 2021-01-02 ENCOUNTER — Ambulatory Visit: Payer: Medicare Other | Admitting: Pain Medicine

## 2021-01-09 ENCOUNTER — Ambulatory Visit
Admission: RE | Admit: 2021-01-09 | Discharge: 2021-01-09 | Disposition: A | Discharge status: Home / Self Care | Payer: Medicare Other | Source: Ambulatory Visit | Attending: Pain Medicine | Admitting: Pain Medicine

## 2021-01-09 ENCOUNTER — Other Ambulatory Visit: Payer: Self-pay

## 2021-01-09 ENCOUNTER — Ambulatory Visit (HOSPITAL_BASED_OUTPATIENT_CLINIC_OR_DEPARTMENT_OTHER): Payer: Medicare Other | Admitting: Pain Medicine

## 2021-01-09 ENCOUNTER — Encounter: Payer: Self-pay | Admitting: Pain Medicine

## 2021-01-09 VITALS — BP 115/73 | HR 71 | Temp 97.2°F | Resp 16 | Ht 69.0 in | Wt 195.0 lb

## 2021-01-09 DIAGNOSIS — Z7901 Long term (current) use of anticoagulants: Secondary | ICD-10-CM | POA: Insufficient documentation

## 2021-01-09 DIAGNOSIS — M47817 Spondylosis without myelopathy or radiculopathy, lumbosacral region: Secondary | ICD-10-CM | POA: Diagnosis present

## 2021-01-09 DIAGNOSIS — M47816 Spondylosis without myelopathy or radiculopathy, lumbar region: Secondary | ICD-10-CM | POA: Diagnosis present

## 2021-01-09 DIAGNOSIS — M5137 Other intervertebral disc degeneration, lumbosacral region: Secondary | ICD-10-CM

## 2021-01-09 DIAGNOSIS — G8929 Other chronic pain: Secondary | ICD-10-CM | POA: Insufficient documentation

## 2021-01-09 DIAGNOSIS — G8918 Other acute postprocedural pain: Secondary | ICD-10-CM | POA: Diagnosis present

## 2021-01-09 DIAGNOSIS — M545 Low back pain, unspecified: Secondary | ICD-10-CM

## 2021-01-09 MED ORDER — LIDOCAINE HCL 2 % IJ SOLN
20.0000 mL | Freq: Once | INTRAMUSCULAR | Status: AC
  Administered 2021-01-09: 400 mg

## 2021-01-09 MED ORDER — HYDROCODONE-ACETAMINOPHEN 5-325 MG PO TABS: 1.0000 | ORAL_TABLET | Freq: Three times a day (TID) | ORAL | 0 refills | Status: AC | PRN

## 2021-01-09 MED ORDER — PENTAFLUOROPROP-TETRAFLUOROETH EX AERO
INHALATION_SPRAY | Freq: Once | CUTANEOUS | Status: AC
  Administered 2021-01-09: 30 via TOPICAL
  Filled 2021-01-09: qty 116

## 2021-01-09 MED ORDER — ROPIVACAINE HCL 2 MG/ML IJ SOLN
9.0000 mL | Freq: Once | INTRAMUSCULAR | Status: AC
  Administered 2021-01-09: 9 mL via PERINEURAL

## 2021-01-09 MED ORDER — ROPIVACAINE HCL 2 MG/ML IJ SOLN
INTRAMUSCULAR | Status: AC
  Filled 2021-01-09: qty 20

## 2021-01-09 MED ORDER — LACTATED RINGERS IV SOLN: 1000.0000 mL | Freq: Once | INTRAVENOUS | Status: DC

## 2021-01-09 MED ORDER — MIDAZOLAM HCL 5 MG/5ML IJ SOLN
0.5000 mg | Freq: Once | INTRAMUSCULAR | Status: AC
  Administered 2021-01-09: 1.5 mg via INTRAVENOUS

## 2021-01-09 MED ORDER — MIDAZOLAM HCL 5 MG/5ML IJ SOLN
INTRAMUSCULAR | Status: AC
  Filled 2021-01-09: qty 5

## 2021-01-09 MED ORDER — TRIAMCINOLONE ACETONIDE 40 MG/ML IJ SUSP
40.0000 mg | Freq: Once | INTRAMUSCULAR | Status: AC
  Administered 2021-01-09: 40 mg
  Filled 2021-01-09: qty 1

## 2021-01-09 MED ORDER — LIDOCAINE HCL 2 % IJ SOLN
INTRAMUSCULAR | Status: AC
  Filled 2021-01-09: qty 20

## 2021-01-09 NOTE — Progress Notes (Signed)
Safety precautions to be maintained throughout the outpatient stay will include: orient to surroundings, keep bed in low position, maintain call bell within reach at all times, provide assistance with transfer out of bed and ambulation.   Patient states he picked up prescription for Valium 57m, however, he could not find prescription bottle today or last night [place] He states he did not take it but would still like some sedation.   Dr. NConsuela Mimesmade aware of situation and verbal order given for IV versed instead.   EAl Decant RN

## 2021-01-09 NOTE — Progress Notes (Signed)
PROVIDER NOTE: Information contained herein reflects review and annotations entered in association with encounter. Interpretation of such information and data should be left to medically-trained personnel. Information provided to patient can be located elsewhere in the medical record under "Patient Instructions". Document created using STT-dictation technology, any transcriptional errors that may result from process are unintentional.    Patient: Kenneth Osborn  Service Category: Procedure Provider: Gaspar Cola, MD DOB: July 25, 1955 DOS: 01/09/2021 Location: [facility] Facility MRN: [id number] Setting: Ambulatory - outpatient Referring Provider: [name], MD Type: Established Patient Specialty: Interventional Pain Management PCP: [name], MD  Primary Reason for Visit: Interventional Pain Management Treatment. CC: Back Pain (R>L)    Procedure:          Anesthesia, Analgesia, Anxiolysis:  Type: Thermal Lumbar Facet, Medial Branch Radiofrequency Ablation/Neurotomy  #3  Primary Purpose: Therapeutic Region: Posterolateral Lumbosacral Spine Level: L2, L3, L4, L5, & S1 Medial Branch Level(s). These levels will denervate the L3-4, L4-5, and the L5-S1 lumbar facet joints. Laterality: Right  Anesthesia: Local (1-2% Lidocaine)  Anxiolysis: IV, Versed 2 mg Sedation: None  Guidance: Fluoroscopy           Position: Prone   Indications: 1. Lumbar facet syndrome (Bilateral) (R>L)   2. Spondylosis without myelopathy or radiculopathy, lumbosacral region   3. Chronic low back pain (Bilateral) w/o sciatica   4. DDD (degenerative disc disease), lumbosacral    Kenneth Osborn has been dealing with the above chronic pain for longer than three months and has either failed to respond, was unable to tolerate, or simply did not get enough benefit from other more conservative therapies including, but not limited to: 1. Over-the-counter medications 2. Anti-inflammatory medications 3. Muscle  relaxants 4. Membrane stabilizers 5. Opioids 6. Physical therapy and/or chiropractic manipulation 7. Modalities (Heat, ice, etc.) 8. Invasive techniques such as nerve blocks. [name] has attained more than 50% relief of the pain from a series of diagnostic injections conducted in separate occasions.  Pain Score: Pre-procedure: 5 /10 Post-procedure: 2 /10    Pre-op H&P Assessment:  Kenneth Osborn is a 65 y.o. (year old), male patient, seen today for interventional treatment. He  has a past surgical history that includes Cardiovascular stress test; Cardiac catheterization; Labrinthectomy; mastoid shunt (Bilateral); Back surgery; Shoulder arthroscopy with subacromial decompression (Left, 04/06/2012); ARTHRODESIS ANTERIOR ANTERIOR CERVICLE SPINE (01/04/2013); Colonoscopy with propofol (N/A, 09/19/2014); Esophagogastroduodenoscopy (N/A, 09/19/2014); Savory dilation (N/A, 09/19/2014); Spinal cord stimulator implant (Right); Cardiac catheterization (N/A, 06/15/2015); Cardiac catheterization (N/A, 06/15/2015); Pulse generator implant (N/A, 01/18/2019); Lumbar spinal cord simulator lead removal (Right, 08/09/2019); IR Perc Cholecystostomy (02/09/2020); IR Radiologist Eval & Mgmt (03/14/2020); Coronary angioplasty; Appendectomy; and Robot assisted laparoscopic nephrectomy (Left, 04/20/2020). Kenneth Osborn has a current medication list which includes the following prescription(s): alprazolam, cetirizine, cyclobenzaprine, diazepam, diltiazem, enalapril, esomeprazole, ezetimibe, gabapentin, glucagon, humulin r u-500 kwikpen, hydrocodone-acetaminophen, [START ON 01/16/2021] hydrocodone-acetaminophen, magnesium oxide, meclizine, metformin, metoprolol succinate, montelukast, niacin, nitroglycerin, oxycodone, rosuvastatin, senna-docusate, aspirin ec, diazepam, [START ON 01/19/2021] oxycodone, [START ON 02/18/2021] oxycodone, polyethylene glycol, and prasugrel, and the following Facility-Administered Medications: lactated ringers and  midazolam. His primarily concern today is the Back Pain (R>L)  Initial Vital Signs:  Pulse/HCG Rate: 75  Temp: (!) 97.2 F (36.2 C) Resp: 18 BP: 128/88 SpO2: 93 %  BMI: Estimated body mass index is 28.8 kg/m as calculated from the following:   Height as of this encounter: 5' 9"  (1.753 m).   Weight as of this encounter: 195 lb (88.5 kg).  Risk Assessment:  Allergies: Reviewed. He has No Known Allergies.  Allergy Precautions: None required Coagulopathies: Reviewed. None identified.  Blood-thinner therapy: None at this time Active Infection(s): Reviewed. None identified. Kenneth Osborn is afebrile  Site Confirmation: Kenneth Osborn was asked to confirm the procedure and laterality before marking the site Procedure checklist: Completed Consent: Before the procedure and under the influence of no sedative(s), amnesic(s), or anxiolytics, the patient was informed of the treatment options, risks and possible complications. To fulfill our ethical and legal obligations, as recommended by the American Medical Association's Code of Ethics, I have informed the patient of my clinical impression; the nature and purpose of the treatment or procedure; the risks, benefits, and possible complications of the intervention; the alternatives, including doing nothing; the risk(s) and benefit(s) of the alternative treatment(s) or procedure(s); and the risk(s) and benefit(s) of doing nothing. The patient was provided information about the general risks and possible complications associated with the procedure. These may include, but are not limited to: failure to achieve desired goals, infection, bleeding, organ or nerve damage, allergic reactions, paralysis, and death. In addition, the patient was informed of those risks and complications associated to Spine-related procedures, such as failure to decrease pain; infection (i.e.: Meningitis, epidural or intraspinal abscess); bleeding (i.e.: epidural hematoma, subarachnoid  hemorrhage, or any other type of intraspinal or peri-dural bleeding); organ or nerve damage (i.e.: Any type of peripheral nerve, nerve root, or spinal cord injury) with subsequent damage to sensory, motor, and/or autonomic systems, resulting in permanent pain, numbness, and/or weakness of one or several areas of the body; allergic reactions; (i.e.: anaphylactic reaction); and/or death. Furthermore, the patient was informed of those risks and complications associated with the medications. These include, but are not limited to: allergic reactions (i.e.: anaphylactic or anaphylactoid reaction(s)); adrenal axis suppression; blood sugar elevation that in diabetics may result in ketoacidosis or comma; water retention that in patients with history of congestive heart failure may result in shortness of breath, pulmonary edema, and decompensation with resultant heart failure; weight gain; swelling or edema; medication-induced neural toxicity; particulate matter embolism and blood vessel occlusion with resultant organ, and/or nervous system infarction; and/or aseptic necrosis of one or more joints. Finally, the patient was informed that Medicine is not an exact science; therefore, there is also the possibility of unforeseen or unpredictable risks and/or possible complications that may result in a catastrophic outcome. The patient indicated having understood very clearly. We have given the patient no guarantees and we have made no promises. Enough time was given to the patient to ask questions, all of which were answered to the patient's satisfaction. Mr. Reever has indicated that he wanted to continue with the procedure. Attestation: I, the ordering provider, attest that I have discussed with the patient the benefits, risks, side-effects, alternatives, likelihood of achieving goals, and potential problems during recovery for the procedure that I have provided informed consent. Date  Time: 01/09/2021 10:07  AM  Pre-Procedure Preparation:  Monitoring: As per clinic protocol. Respiration, ETCO2, SpO2, BP, heart rate and rhythm monitor placed and checked for adequate function Safety Precautions: Patient was assessed for positional comfort and pressure points before starting the procedure. Time-out: I initiated and conducted the "Time-out" before starting the procedure, as per protocol. The patient was asked to participate by confirming the accuracy of the "Time Out" information. Verification of the correct person, site, and procedure were performed and confirmed by me, the nursing staff, and the patient. "Time-out" conducted as per Joint Commission's Universal Protocol (UP.01.01.01). Time: 1125  Description of Procedure:          Laterality: Right Levels:  L2, L3, L4, L5, & S1 Medial Branch Level(s), at the L3-4, L4-5, and the L5-S1 lumbar facet joints. Area Prepped: Lumbosacral DuraPrep (Iodine Povacrylex [0.7% available iodine] and Isopropyl Alcohol, 74% w/w) Safety Precautions: Aspiration looking for blood return was conducted prior to all injections. At no point did we inject any substances, as a needle was being advanced. Before injecting, the patient was told to immediately notify me if he was experiencing any new onset of "ringing in the ears, or metallic taste in the mouth". No attempts were made at seeking any paresthesias. Safe injection practices and needle disposal techniques used. Medications properly checked for expiration dates. SDV (single dose vial) medications used. After the completion of the procedure, all disposable equipment used was discarded in the proper designated medical waste containers. Local Anesthesia: Protocol guidelines were followed. The patient was positioned over the fluoroscopy table. The area was prepped in the usual manner. The time-out was completed. The target area was identified using fluoroscopy. A 12-in long, straight, sterile hemostat was used with fluoroscopic  guidance to locate the targets for each level blocked. Once located, the skin was marked with an approved surgical skin marker. Once all sites were marked, the skin (epidermis, dermis, and hypodermis), as well as deeper tissues (fat, connective tissue and muscle) were infiltrated with a small amount of a short-acting local anesthetic, loaded on a 10cc syringe with a 25G, 1.5-in  Needle. An appropriate amount of time was allowed for local anesthetics to take effect before proceeding to the next step. Local Anesthetic: Lidocaine 2.0% The unused portion of the local anesthetic was discarded in the proper designated containers. Technical explanation of process:  Radiofrequency Ablation (RFA) L2 Medial Branch Nerve RFA: The target area for the L2 medial branch is at the junction of the postero-lateral aspect of the superior articular process and the superior, posterior, and medial edge of the transverse process of L3. Under fluoroscopic guidance, a Radiofrequency needle was inserted until contact was made with os over the superior postero-lateral aspect of the pedicular shadow (target area). Sensory and motor testing was conducted to properly adjust the position of the needle. Once satisfactory placement of the needle was achieved, the numbing solution was slowly injected after negative aspiration for blood. 2.0 mL of the nerve block solution was injected without difficulty or complication. After waiting for at least 3 minutes, the ablation was performed. Once completed, the needle was removed intact. L3 Medial Branch Nerve RFA: The target area for the L3 medial branch is at the junction of the postero-lateral aspect of the superior articular process and the superior, posterior, and medial edge of the transverse process of L4. Under fluoroscopic guidance, a Radiofrequency needle was inserted until contact was made with os over the superior postero-lateral aspect of the pedicular shadow (target area). Sensory and  motor testing was conducted to properly adjust the position of the needle. Once satisfactory placement of the needle was achieved, the numbing solution was slowly injected after negative aspiration for blood. 2.0 mL of the nerve block solution was injected without difficulty or complication. After waiting for at least 3 minutes, the ablation was performed. Once completed, the needle was removed intact. L4 Medial Branch Nerve RFA: The target area for the L4 medial branch is at the junction of the postero-lateral aspect of the superior articular process and the superior, posterior, and medial edge of the transverse process of L5.  Under fluoroscopic guidance, a Radiofrequency needle was inserted until contact was made with os over the superior postero-lateral aspect of the pedicular shadow (target area). Sensory and motor testing was conducted to properly adjust the position of the needle. Once satisfactory placement of the needle was achieved, the numbing solution was slowly injected after negative aspiration for blood. 2.0 mL of the nerve block solution was injected without difficulty or complication. After waiting for at least 3 minutes, the ablation was performed. Once completed, the needle was removed intact. L5 Medial Branch Nerve RFA: The target area for the L5 medial branch is at the junction of the postero-lateral aspect of the superior articular process of S1 and the superior, posterior, and medial edge of the sacral ala. Under fluoroscopic guidance, a Radiofrequency needle was inserted until contact was made with os over the superior postero-lateral aspect of the pedicular shadow (target area). Sensory and motor testing was conducted to properly adjust the position of the needle. Once satisfactory placement of the needle was achieved, the numbing solution was slowly injected after negative aspiration for blood. 2.0 mL of the nerve block solution was injected without difficulty or complication. After  waiting for at least 3 minutes, the ablation was performed. Once completed, the needle was removed intact. S1 Medial Branch Nerve RFA: The target area for the S1 medial branch is located inferior to the junction of the S1 superior articular process and the L5 inferior articular process, posterior, inferior, and lateral to the 6 o'clock position of the L5-S1 facet joint, just superior to the S1 posterior foramen. Under fluoroscopic guidance, the Radiofrequency needle was advanced until contact was made with os over the Target area. Sensory and motor testing was conducted to properly adjust the position of the needle. Once satisfactory placement of the needle was achieved, the numbing solution was slowly injected after negative aspiration for blood. 2.0 mL of the nerve block solution was injected without difficulty or complication. After waiting for at least 3 minutes, the ablation was performed. Once completed, the needle was removed intact. Radiofrequency lesioning (ablation):  Radiofrequency Generator: NeuroTherm [id number] Sensory Stimulation Parameters: 50 Hz was used to locate & identify the nerve, making sure that the needle was positioned such that there was no sensory stimulation below 0.3 V or above 0.7 V. Motor Stimulation Parameters: 2 Hz was used to evaluate the motor component. Care was taken not to lesion any nerves that demonstrated motor stimulation of the lower extremities at an output of less than 2.5 times that of the sensory threshold, or a maximum of 2.0 V. Lesioning Technique Parameters: Standard Radiofrequency settings. (Not bipolar or pulsed.) Temperature Settings: 80 degrees C Lesioning time: 60 seconds Intra-operative Compliance: Compliant Materials & Medications: Needle(s) (Electrode/Cannula) Type: Teflon-coated, curved tip, Radiofrequency needle(s) Gauge: 22G Length: 10cm Numbing solution: 0.2% PF-Ropivacaine + Triamcinolone (40 mg/mL) diluted to a final concentration of 4 mg of  Triamcinolone/mL of Ropivacaine The unused portion of the solution was discarded in the proper designated containers.  Once the entire procedure was completed, the treated area was cleaned, making sure to leave some of the prepping solution back to take advantage of its long term bactericidal properties.    Illustration of the posterior view of the lumbar spine and the posterior neural structures. Laminae of L2 through S1 are labeled. DPRL5, dorsal primary ramus of L5; DPRS1, dorsal primary ramus of S1; DPR3, dorsal primary ramus of L3; FJ, facet (zygapophyseal) joint L3-L4; I, inferior articular process of L4; LB1, lateral branch of  dorsal primary ramus of L1; IAB, inferior articular branches from L3 medial branch (supplies L4-L5 facet joint); IBP, intermediate branch plexus; MB3, medial branch of dorsal primary ramus of L3; NR3, third lumbar nerve root; S, superior articular process of L5; SAB, superior articular branches from L4 (supplies L4-5 facet joint also); TP3, transverse process of L3.  Vitals:   01/09/21 1145 01/09/21 1150 01/09/21 1155 01/09/21 1158  BP: 129/89 125/89 126/84 115/73  Pulse: 72 70 72 71  Resp:  15 16   Temp:      TempSrc:      SpO2: 100% 100% 100% 100%  Weight:      Height:       Start Time: 1126 hrs. End Time: 1152 hrs.  Imaging Guidance (Spinal):          Type of Imaging Technique: Fluoroscopy Guidance (Spinal) Indication(s): Assistance in needle guidance and placement for procedures requiring needle placement in or near specific anatomical locations not easily accessible without such assistance. Exposure Time: Please see nurses notes. Contrast: None used. Fluoroscopic Guidance: I was personally present during the use of fluoroscopy. "Tunnel Vision Technique" used to obtain the best possible view of the target area. Parallax error corrected before commencing the procedure. "Direction-depth-direction" technique used to introduce the needle under continuous pulsed  fluoroscopy. Once target was reached, antero-posterior, oblique, and lateral fluoroscopic projection used confirm needle placement in all planes. Images permanently stored in EMR. Interpretation: No contrast injected. I personally interpreted the imaging intraoperatively. Adequate needle placement confirmed in multiple planes. Permanent images saved into the patient's record.  Antibiotic Prophylaxis:   Anti-infectives (From admission, onward)    None      Indication(s): None identified  Post-operative Assessment:  Post-procedure Vital Signs:  Pulse/HCG Rate: 71  Temp: (!) 97.2 F (36.2 C) Resp: 16 BP: 115/73 SpO2: 100 %  EBL: None  Complications: No immediate post-treatment complications observed by team, or reported by patient.  Note: The patient tolerated the entire procedure well. A repeat set of vitals were taken after the procedure and the patient was kept under observation following institutional policy, for this type of procedure. Post-procedural neurological assessment was performed, showing return to baseline, prior to discharge. The patient was provided with post-procedure discharge instructions, including a section on how to identify potential problems. Should any problems arise concerning this procedure, the patient was given instructions to immediately contact us, at any time, without hesitation. In any case, we plan to contact the patient by telephone for a follow-up status report regarding this interventional procedure.  Comments:  No additional relevant information.  Plan of Care  Orders:  Orders Placed This Encounter  Procedures   Radiofrequency,Lumbar    Scheduling Instructions:     Side(s): Right     Level: L3-4, L4-5, & L5-S1 Facets (L2, L3, L4, L5, & S1 Medial Branch Nerves)     Sedation: Patient's choice.     Timeframe: Today    Order Specific Question:   Where will this procedure be performed?    Answer:   [facility]   Radiofrequency,Lumbar     Standing Status:   Future    Standing Expiration Date:   04/11/2021    Scheduling Instructions:     Side(s): Left     Level: L3-4, L4-5, & L5-S1 Facets (L2, L3, L4, L5, & S1 Medial Branch Nerves)     Sedation: Patient's choice.     Scheduling Timeframe: 6 weeks from now    Order Specific Question:  Where will this procedure be performed?    Answer:   [facility]   DG PAIN CLINIC C-ARM 1-60 MIN NO REPORT    Intraoperative interpretation by procedural physician at [place].    Standing Status:   Standing    Number of Occurrences:   1    Order Specific Question:   Reason for exam:    Answer:   Assistance in needle guidance and placement for procedures requiring needle placement in or near specific anatomical locations not easily accessible without such assistance.   Informed Consent Details: Physician/Practitioner Attestation; Transcribe to consent form and obtain patient signature    Nursing Order: Transcribe to consent form and obtain patient signature. Note: Always confirm laterality of pain with Mr. Turvey, before procedure.    Order Specific Question:   Physician/Practitioner attestation of informed consent for procedure/surgical case    Answer:   I, the physician/practitioner, attest that I have discussed with the patient the benefits, risks, side effects, alternatives, likelihood of achieving goals and potential problems during recovery for the procedure that I have provided informed consent.    Order Specific Question:   Procedure    Answer:   Lumbar Facet Radiofrequency Ablation    Order Specific Question:   Physician/Practitioner performing the procedure    Answer:   Keishla Oyer A. Dossie Arbour, MD    Order Specific Question:   Indication/Reason    Answer:   Low Back Pain, with our without leg pain, due to Facet Joint Arthralgia (Joint Pain) known as Lumbar Facet Syndrome, secondary to Lumbar, and/or Lumbosacral Spondylosis (Arthritis of the Spine), without  myelopathy or radiculopathy (Nerve Damage).   Care order/instruction: Please confirm that the patient has stopped the Effient (Prasugrel) x 10 days prior to procedure or surgery.    Please confirm that the patient has stopped the Effient (Prasugrel) x 10 days prior to procedure or surgery.    Standing Status:   Standing    Number of Occurrences:   1   Provide equipment / supplies at bedside    "Radiofrequency Tray"; Large hemostat (x1); Small hemostat (x1); Towels (x8); 4x4 sterile sponge pack (x1) Needle type: Teflon-coated Radiofrequency Needle (Disposable  single use) Size: Regular Quantity: 5    Standing Status:   Standing    Number of Occurrences:   1    Order Specific Question:   Specify    Answer:   Radiofrequency Tray   Bleeding precautions    Standing Status:   Standing    Number of Occurrences:   1    Chronic Opioid Analgesic:  Oxycodone IR 5 mg, 1 tab p.o. every 4 hours (30 mg/day of oxycodone) MME/day: 45 mg/day.   Medications ordered for procedure: Meds ordered this encounter  Medications   lidocaine (XYLOCAINE) 2 % (with pres) injection 400 mg   pentafluoroprop-tetrafluoroeth (GEBAUERS) aerosol   ropivacaine (PF) 2 mg/mL (0.2%) (NAROPIN) injection 9 mL   triamcinolone acetonide (KENALOG-40) injection 40 mg   HYDROcodone-acetaminophen (NORCO/VICODIN) 5-325 MG tablet    Sig: Take 1 tablet by mouth every 8 (eight) hours as needed for up to 7 days for severe pain. Must last 7 days.    Dispense:  21 tablet    Refill:  0    For acute post-operative pain. Not to be refilled. Must last 7 days.   HYDROcodone-acetaminophen (NORCO/VICODIN) 5-325 MG tablet    Sig: Take 1 tablet by mouth every 8 (eight) hours as needed for up to 7 days for severe pain.  Must last for 7 days.    Dispense:  21 tablet    Refill:  0    For acute post-operative pain. Not to be refilled. Must last 7 days.   lactated ringers infusion 1,000 mL   midazolam (VERSED) 5 MG/5ML injection 0.5-2 mg    Make  sure Flumazenil is available in the pyxis when using this medication. If oversedation occurs, administer 0.2 mg IV over 15 sec. If after 45 sec no response, administer 0.2 mg again over 1 min; may repeat at 1 min intervals; not to exceed 4 doses (1 mg)    Medications administered: We administered lidocaine, pentafluoroprop-tetrafluoroeth, ropivacaine (PF) 2 mg/mL (0.2%), and triamcinolone acetonide.  See the medical record for exact dosing, route, and time of administration.  Follow-up plan:   Return in about 6 weeks (around 02/20/2021) for (64mn), (Clinic) procedure: (L) L-FCT RFA, (Sed-anx), (Blood Thinner Protocol).     Interventional Therapies  Risk  Complexity Considerations:   Estimated body mass index is 28.94 kg/m as calculated from the following:   Height as of this encounter: 5' 9"  (1.753 m).   Weight as of this encounter: 196 lb (88.9 kg). NOTE: EFFIENT Anticoagulation (Stop: 7-10 days  Re-start: 6 hrs)   Planned  Pending:   Palliative left lumbar facet RFA #3    Under consideration:   Therapeutic/palliative bilateral lumbar facet RFA    Completed:   Palliative right PSIS MNB/TPI x3 (11/13/2017)  Diagnostic/palliative right lumbar facet MBB x2 (09/22/2018)  Diagnostic/palliative left lumbar facet MBB x2 (09/22/2018)  Palliative left lumbar facet RFA x2 (01/04/2020)  Palliative right lumbar facet RFA x2 (12/02/2019)    Therapeutic  Palliative (PRN) options:   Palliative right PSIS MNB/TPI  Diagnostic/palliative right lumbar facet block  Diagnostic/palliative left lumbar facet block  Palliative left lumbar facet RFA  Palliative right lumbar facet RFA      Recent Visits Date Type Provider Dept  12/18/20 Office Visit NMilinda Pointer MD [facility]  Showing recent visits within past 90 days and meeting all other requirements Today's Visits Date Type Provider Dept  01/09/21 Procedure visit NMilinda Pointer MD [facility]  Showing today's  visits and meeting all other requirements Future Appointments Date Type Provider Dept  02/20/21 Appointment NMilinda Pointer MD [facility]  03/19/21 Appointment NMilinda Pointer MD [facility]  Showing future appointments within next 90 days and meeting all other requirements Disposition: Discharge home  Discharge (Date  Time): 01/09/2021; 1205 hrs.   Primary Care Physician: HMaryland Pink MD [facility] Note by: FGaspar Cola MD Date: 01/09/2021; Time: 12:29 PM  Disclaimer:  Medicine is not an eChief Strategy Officer The only guarantee in medicine is that nothing is guaranteed. It is important to note that the decision to proceed with this intervention was based on the information collected from the patient. The Data and conclusions were drawn from the patient's questionnaire, the interview, and the physical examination. Because the information was provided in large part by the patient, it cannot be guaranteed that it has not been purposely or unconsciously manipulated. Every effort has been made to obtain as much relevant data as possible for this evaluation. It is important to note that the conclusions that lead to this procedure are derived in large part from the available data. Always take into account that the treatment will also be dependent on availability of resources and existing treatment guidelines, considered by other Pain Management Practitioners as being common knowledge and  practice, at the time of the intervention. For Medico-Legal purposes, it is also important to point out that variation in procedural techniques and pharmacological choices are the acceptable norm. The indications, contraindications, technique, and results of the above procedure should only be interpreted and judged by a Board-Certified Interventional Pain Specialist with extensive familiarity and expertise in the same exact procedure and technique.

## 2021-01-09 NOTE — Patient Instructions (Signed)
___________________________________________________________________________________________  Post-Radiofrequency (RF) Discharge Instructions  You have just completed a Radiofrequency Neurotomy.  The following instructions will provide you with information and guidelines for self-care upon discharge.  If at any time you have questions or concerns please call your physician. DO NOT DRIVE YOURSELF!!  Instructions: Apply ice: Fill a plastic sandwich bag with crushed ice. Cover it with a small towel and apply to injection site. Apply for 15 minutes then remove x 15 minutes. Repeat sequence on day of procedure, until you go to bed. The purpose is to minimize swelling and discomfort after procedure. Apply heat: Apply heat to procedure site starting the day following the procedure. The purpose is to treat any soreness and discomfort from the procedure. Food intake: No eating limitations, unless stipulated above.  Nevertheless, if you have had sedation, you may experience some nausea.  In this case, it may be wise to wait at least two hours prior to resuming regular diet. Physical activities: Keep activities to a minimum for the first 8 hours after the procedure. For the first 24 hours after the procedure, do not drive a motor vehicle,  Operate heavy machinery, power tools, or handle any weapons.  Consider walking with the use of an assistive device or accompanied by an adult for the first 24 hours.  Do not drink alcoholic beverages including beer.  Do not make any important decisions or sign any legal documents. Go home and rest today.  Resume activities tomorrow, as tolerated.  Use caution in moving about as you may experience mild leg weakness.  Use caution in cooking, use of household electrical appliances and climbing steps. Driving: If you have received any sedation, you are not allowed to drive for 24 hours after your procedure. Blood thinner: Restart your blood thinner 6 hours after your procedure. (Only  for those taking blood thinners) Insulin: As soon as you can eat, you may resume your normal dosing schedule. (Only for those taking insulin) Medications: May resume pre-procedure medications.  Do not take any drugs, other than what has been prescribed to you. Infection prevention: Keep procedure site clean and dry. Post-procedure Pain Diary: Extremely important that this be done correctly and accurately. Recorded information will be used to determine the next step in treatment. Pain evaluated is that of treated area only. Do not include pain from an untreated area. Complete every hour, on the hour, for the initial 8 hours. Set an alarm to help you do this part accurately. Do not go to sleep and have it completed later. It will not be accurate. Follow-up appointment: Keep your follow-up appointment after the procedure. Usually 2-6 weeks after radiofrequency. Bring you pain diary. The information collected will be essential for your long-term care.   Expect: From numbing medicine (AKA: Local Anesthetics): Numbness or decrease in pain. Onset: Full effect within 15 minutes of injected. Duration: It will depend on the type of local anesthetic used. On the average, 1 to 8 hours.  From steroids (when added): Decrease in swelling or inflammation. Once inflammation is improved, relief of the pain will follow. Onset of benefits: Depends on the amount of swelling present. The more swelling, the longer it will take for the benefits to be seen. In some cases, up to 10 days. Duration: Steroids will stay in the system x 2 weeks. Duration of benefits will depend on multiple posibilities including persistent irritating factors. From procedure: Some discomfort is to be expected once the numbing medicine wears off. In the case of radiofrequency procedures,  this may last as long as 6 weeks. Additional post-procedure pain medication is provided for this. Discomfort is minimized if ice and heat are applied as  instructed.  Call if: You experience numbness and weakness that gets worse with time, as opposed to wearing off. He experience any unusual bleeding, difficulty breathing, or loss of the ability to control your bowel and bladder. (This applies to Spinal procedures only) You experience any redness, swelling, heat, red streaks, elevated temperature, fever, or any other signs of a possible infection.  Emergency Numbers: [facility] hours (Monday - Thursday, 8:00 AM - 4:00 PM) (Friday, 9:00 AM - 12:00 Noon): [phone] After hours: [phone] ____________________________________________________________________________________________  ______________________________________________________________________  Preparing for Procedure with Sedation  NOTICE: Due to recent regulatory changes, starting on September 18, 2020, procedures requiring intravenous (IV) sedation will no longer be performed at the [facility].  These types of procedures are required to be performed at [facility] ambulatory surgery facility.  We are very sorry for the inconvenience.  Procedure appointments are limited to planned procedures: No Prescription Refills. No disability issues will be discussed. No medication changes will be discussed.  Instructions: Oral Intake: Do not eat or drink anything for at least 8 hours prior to your procedure. (Exception: Blood Pressure Medication. See below.) Transportation: A driver is required. You may not drive yourself after the procedure. Blood Pressure Medicine: Do not forget to take your blood pressure medicine with a sip of water the morning of the procedure. If your Diastolic (lower reading) is above 100 mmHg, elective cases will be cancelled/rescheduled. Blood thinners: These will need to be stopped for procedures. Notify our staff if you are taking any blood thinners. Depending on which one you take, there will be specific instructions on how and when to stop  it. Diabetics on insulin: Notify the staff so that you can be scheduled 1st case in the morning. If your diabetes requires high dose insulin, take only  of your normal insulin dose the morning of the procedure and notify the staff that you have done so. Preventing infections: Shower with an antibacterial soap the morning of your procedure. Build-up your immune system: Take 1000 mg of Vitamin C with every meal (3 times a day) the day prior to your procedure. Antibiotics: Inform the staff if you have a condition or reason that requires you to take antibiotics before dental procedures. Pregnancy: If you are pregnant, call and cancel the procedure. Sickness: If you have a cold, fever, or any active infections, call and cancel the procedure. Arrival: You must be in the facility at least 30 minutes prior to your scheduled procedure. Children: Do not bring children with you. Dress appropriately: Bring dark clothing that you would not mind if they get stained. Valuables: Do not bring any jewelry or valuables.  Reasons to call and reschedule or cancel your procedure: (Following these recommendations will minimize the risk of a serious complication.) Surgeries: Avoid having procedures within 2 weeks of any surgery. (Avoid for 2 weeks before or after any surgery). Flu Shots: Avoid having procedures within 2 weeks of a flu shots. (Avoid for 2 weeks before or after immunizations). Barium: Avoid having a procedure within 7-10 days after having had a radiological study involving the use of radiological contrast. (Myelograms, Barium swallow or enema study). Heart attacks: Avoid any elective procedures or surgeries for the initial 6 months after a "Myocardial Infarction" (Heart Attack). Blood thinners: It is imperative that you stop these medications before procedures. Let  us know if you if you take any blood thinner.  Infection: Avoid procedures during or within two weeks of an infection (including chest colds or  gastrointestinal problems). Symptoms associated with infections include: Localized redness, fever, chills, night sweats or profuse sweating, burning sensation when voiding, cough, congestion, stuffiness, runny nose, sore throat, diarrhea, nausea, vomiting, cold or Flu symptoms, recent or current infections. It is specially important if the infection is over the area that we intend to treat. Heart and lung problems: Symptoms that may suggest an active cardiopulmonary problem include: cough, chest pain, breathing difficulties or shortness of breath, dizziness, ankle swelling, uncontrolled high or unusually low blood pressure, and/or palpitations. If you are experiencing any of these symptoms, cancel your procedure and contact your primary care physician for an evaluation.  Remember:  Regular Business hours are:  Monday to Thursday 8:00 AM to 4:00 PM  Provider's Schedule: Milinda Pointer, MD:  Procedure days: Tuesday and Thursday 7:30 AM to 4:00 PM  Gillis Santa, MD:  Procedure days: Monday and Wednesday 7:30 AM to 4:00 PM ______________________________________________________________________  ____________________________________________________________________________________________  Blood Thinners  IMPORTANT NOTICE:  If you take any of these, make sure to notify the nursing staff.  Failure to do so may result in injury.  Recommended time intervals to stop and restart blood-thinners, before & after invasive procedures  Generic Name Brand Name Pre-procedure. Stop this long before procedure. Post-procedure. Minimum waiting period before restarting.  Abciximab Reopro 15 days 2 hrs  Alteplase Activase 10 days 10 days  Anagrelide Agrylin    Apixaban Eliquis 3 days 6 hrs  Cilostazol Pletal 3 days 5 hrs  Clopidogrel Plavix 7-10 days 2 hrs  Dabigatran Pradaxa 5 days 6 hrs  Dalteparin Fragmin 24 hours 4 hrs  Dipyridamole Aggrenox 11days 2 hrs  Edoxaban Lixiana; Savaysa 3 days 2 hrs   Enoxaparin  Lovenox 24 hours 4 hrs  Eptifibatide Integrillin 8 hours 2 hrs  Fondaparinux  Arixtra 72 hours 12 hrs  Hydroxychloroquine Plaquenil 11 days   Prasugrel Effient 7-10 days 6 hrs  Reteplase Retavase 10 days 10 days  Rivaroxaban Xarelto 3 days 6 hrs  Ticagrelor Brilinta 5-7 days 6 hrs  Ticlopidine Ticlid 10-14 days 2 hrs  Tinzaparin Innohep 24 hours 4 hrs  Tirofiban Aggrastat 8 hours 2 hrs  Warfarin Coumadin 5 days 2 hrs   Other medications with blood-thinning effects  Product indications Generic (Brand) names Note  Cholesterol Lipitor Stop 4 days before procedure  Blood thinner (injectable) Heparin (LMW or LMWH Heparin) Stop 24 hours before procedure  Cancer Ibrutinib (Imbruvica) Stop 7 days before procedure  Malaria/Rheumatoid Hydroxychloroquine (Plaquenil) Stop 11 days before procedure  Thrombolytics  10 days before or after procedures   Over-the-counter (OTC) Products with blood-thinning effects  Product Common names Stop Time  Aspirin > 325 mg Goody Powders, Excedrin, etc. 11 days  Aspirin ? 81 mg  7 days  Fish oil  4 days  Garlic supplements  7 days  Ginkgo biloba  36 hours  Ginseng  24 hours  NSAIDs Ibuprofen, Naprosyn, etc. 3 days  Vitamin E  4 days   ____________________________________________________________________________________________ ____________________________________________________________________________________________  General Risks and Possible Complications  Patient Responsibilities: It is important that you read this as it is part of your informed consent. It is our duty to inform you of the risks and possible complications associated with treatments offered to you. It is your responsibility as a patient to read this and to ask questions about anything that is not clear or  that you believe was not covered in this document.  Patient's Rights: You have the right to refuse treatment. You also have the right to change your mind, even after  initially having agreed to have the treatment done. However, under this last option, if you wait until the last second to change your mind, you may be charged for the materials used up to that point.  Introduction: Medicine is not an Chief Strategy Officer. Everything in Medicine, including the lack of treatment(s), carries the potential for danger, harm, or loss (which is by definition: Risk). In Medicine, a complication is a secondary problem, condition, or disease that can aggravate an already existing one. All treatments carry the risk of possible complications. The fact that a side effects or complications occurs, does not imply that the treatment was conducted incorrectly. It must be clearly understood that these can happen even when everything is done following the highest safety standards.  No treatment: You can choose not to proceed with the proposed treatment alternative. The "PRO(s)" would include: avoiding the risk of complications associated with the therapy. The "CON(s)" would include: not getting any of the treatment benefits. These benefits fall under one of three categories: diagnostic; therapeutic; and/or palliative. Diagnostic benefits include: getting information which can ultimately lead to improvement of the disease or symptom(s). Therapeutic benefits are those associated with the successful treatment of the disease. Finally, palliative benefits are those related to the decrease of the primary symptoms, without necessarily curing the condition (example: decreasing the pain from a flare-up of a chronic condition, such as incurable terminal cancer).  General Risks and Complications: These are associated to most interventional treatments. They can occur alone, or in combination. They fall under one of the following six (6) categories: no benefit or worsening of symptoms; bleeding; infection; nerve damage; allergic reactions; and/or death. No benefits or worsening of symptoms: In Medicine there are  no guarantees, only probabilities. No healthcare provider can ever guarantee that a medical treatment will work, they can only state the probability that it may. Furthermore, there is always the possibility that the condition may worsen, either directly, or indirectly, as a consequence of the treatment. Bleeding: This is more common if the patient is taking a blood thinner, either prescription or over the counter (example: Goody Powders, Fish oil, Aspirin, Garlic, etc.), or if suffering a condition associated with impaired coagulation (example: Hemophilia, cirrhosis of the liver, low platelet counts, etc.). However, even if you do not have one on these, it can still happen. If you have any of these conditions, or take one of these drugs, make sure to notify your treating physician. Infection: This is more common in patients with a compromised immune system, either due to disease (example: diabetes, cancer, human immunodeficiency virus [HIV], etc.), or due to medications or treatments (example: therapies used to treat cancer and rheumatological diseases). However, even if you do not have one on these, it can still happen. If you have any of these conditions, or take one of these drugs, make sure to notify your treating physician. Nerve Damage: This is more common when the treatment is an invasive one, but it can also happen with the use of medications, such as those used in the treatment of cancer. The damage can occur to small secondary nerves, or to large primary ones, such as those in the spinal cord and brain. This damage may be temporary or permanent and it may lead to impairments that can range from temporary numbness to permanent paralysis  and/or brain death. Allergic Reactions: Any time a substance or material comes in contact with our body, there is the possibility of an allergic reaction. These can range from a mild skin rash (contact dermatitis) to a severe systemic reaction (anaphylactic reaction),  which can result in death. Death: In general, any medical intervention can result in death, most of the time due to an unforeseen complication. ____________________________________________________________________________________________

## 2021-01-10 ENCOUNTER — Telehealth: Payer: Self-pay

## 2021-01-10 NOTE — Telephone Encounter (Signed)
Called PP. States  his BS is 400's . Instructed to monitor it and could take several days to decrease from the steroid given. Instructed to call primary or ED if needed.

## 2021-02-07 DIAGNOSIS — N2581 Secondary hyperparathyroidism of renal origin: Secondary | ICD-10-CM | POA: Insufficient documentation

## 2021-02-19 NOTE — Progress Notes (Signed)
PROVIDER NOTE: Interpretation of information contained herein should be left to medically-trained personnel. Specific patient instructions are provided elsewhere under "Patient Instructions" section of medical record. This document was created in part using STT-dictation technology, any transcriptional errors that may result from this process are unintentional.  Patient: Kenneth Osborn Type: Established DOB: 1955/04/07 MRN: [id number] PCP: [name], MD  Service: Procedure DOS: 02/20/2021 Setting: Ambulatory Location: Ambulatory outpatient facility Delivery: Face-to-face Provider: Gaspar Cola, MD Specialty: Interventional Pain Management Specialty designation: 09 Location: Outpatient facility Ref. Prov.: [name], MD    Primary Reason for Visit: Interventional Pain Management Treatment. CC: Back Pain (Left, lower)    Procedure:          Anesthesia, Analgesia, Anxiolysis:  Type: Thermal Lumbar Facet, Medial Branch Radiofrequency Ablation/Neurotomy  #3  Primary Purpose: Therapeutic Region: Posterolateral Lumbosacral Spine Level: L2, L3, L4, L5, & S1 Medial Branch Level(s). These levels will denervate the L3-4, L4-5, and the L5-S1 lumbar facet joints. Laterality: Left  Anesthesia: Local (1-2% Lidocaine)  Anxiolysis: IV (Versed 1 mg) Sedation: None  Guidance: Fluoroscopy           Position: Prone   Indications: 1. Lumbar facet syndrome (Bilateral) (R>L)   2. Spondylosis without myelopathy or radiculopathy, lumbosacral region   3. Chronic low back pain (Bilateral) w/o sciatica   4. DDD (degenerative disc disease), lumbosacral   5. Failed back surgical syndrome   6. Chronic anticoagulation (Effient)    Kenneth Osborn has been dealing with the above chronic pain for longer than three months and has either failed to respond, was unable to tolerate, or simply did not get enough benefit from other more conservative therapies including, but not limited to: 1. Over-the-counter  medications 2. Anti-inflammatory medications 3. Muscle relaxants 4. Membrane stabilizers 5. Opioids 6. Physical therapy and/or chiropractic manipulation 7. Modalities (Heat, ice, etc.) 8. Invasive techniques such as nerve blocks. Kenneth Osborn has attained more than 50% relief of the pain from a series of diagnostic injections conducted in separate occasions.  Pain Score: Pre-procedure: 3 /10 Post-procedure: 0-No pain/10     Post-procedure evaluation     Procedure:          Anesthesia, Analgesia, Anxiolysis:  Type: Thermal Lumbar Facet, Medial Branch Radiofrequency Ablation/Neurotomy  #3  Primary Purpose: Therapeutic Region: Posterolateral Lumbosacral Spine Level: L2, L3, L4, L5, & S1 Medial Branch Level(s). These levels will denervate the L3-4, L4-5, and the L5-S1 lumbar facet joints. Laterality: Right  Anesthesia: Local (1-2% Lidocaine)  Anxiolysis: IV, Versed 1 mg Sedation: None  Guidance: Fluoroscopy           Position: Prone   Indications: 1. Lumbar facet syndrome (Bilateral) (R>L)   2. Spondylosis without myelopathy or radiculopathy, lumbosacral region   3. Chronic low back pain (Bilateral) w/o sciatica   4. DDD (degenerative disc disease), lumbosacral    Kenneth Osborn has been dealing with the above chronic pain for longer than three months and has either failed to respond, was unable to tolerate, or simply did not get enough benefit from other more conservative therapies including, but not limited to: 1. Over-the-counter medications 2. Anti-inflammatory medications 3. Muscle relaxants 4. Membrane stabilizers 5. Opioids 6. Physical therapy and/or chiropractic manipulation 7. Modalities (Heat, ice, etc.) 8. Invasive techniques such as nerve blocks. Kenneth Osborn has attained more than 50% relief of the pain from a series of diagnostic injections conducted in separate occasions.  Pain Score: Pre-procedure: 5 /10 Post-procedure: 2 /10     Effectiveness:  Initial hour  after procedure: 100 %. Subsequent 4-6 hours post-procedure: 95 %. Analgesia past initial 6 hours: 50 %. Ongoing improvement:  Analgesic: The patient indicates currently experiencing an ongoing 50% relief of his low back pain.  Today he is schedule to have the left side done. Function: Kenneth Osborn reports improvement in function ROM: Kenneth Osborn reports improvement in ROM  Pre-op H&P Assessment:  Kenneth Osborn is a 66 y.o. (year old), male patient, seen today for interventional treatment. He  has a past surgical history that includes Cardiovascular stress test; Cardiac catheterization; Labrinthectomy; mastoid shunt (Bilateral); Back surgery; Shoulder arthroscopy with subacromial decompression (Left, 04/06/2012); ARTHRODESIS ANTERIOR ANTERIOR CERVICLE SPINE (01/04/2013); Colonoscopy with propofol (N/A, 09/19/2014); Esophagogastroduodenoscopy (N/A, 09/19/2014); Savory dilation (N/A, 09/19/2014); Spinal cord stimulator implant (Right); Cardiac catheterization (N/A, 06/15/2015); Cardiac catheterization (N/A, 06/15/2015); Pulse generator implant (N/A, 01/18/2019); Lumbar spinal cord simulator lead removal (Right, 08/09/2019); IR Perc Cholecystostomy (02/09/2020); IR Radiologist Eval & Mgmt (03/14/2020); Coronary angioplasty; Appendectomy; and Robot assisted laparoscopic nephrectomy (Left, 04/20/2020). Kenneth Osborn has a current medication list which includes the following prescription(s): alprazolam, cetirizine, cyclobenzaprine, diazepam, diazepam, diltiazem, enalapril, esomeprazole, ezetimibe, gabapentin, glucagon, humulin r u-500 kwikpen, hydrocodone-acetaminophen, [START ON 02/27/2021] hydrocodone-acetaminophen, magnesium oxide, meclizine, metformin, metoprolol succinate, montelukast, niacin, nitroglycerin, oxycodone, prasugrel, rosuvastatin, senna-docusate, aspirin ec, movantik, oxycodone, oxycodone, and polyethylene glycol, and the following Facility-Administered Medications: midazolam and pentafluoroprop-tetrafluoroeth. His  primarily concern today is the Back Pain (Left, lower)  Initial Vital Signs:  Pulse/HCG Rate: 72ECG Heart Rate: 67 Temp: (!) 97.2 F (36.2 C) Resp: 16 BP: 101/67 SpO2: 97 %  BMI: Estimated body mass index is 28.8 kg/m as calculated from the following:   Height as of this encounter: 5' 9"  (1.753 m).   Weight as of this encounter: 195 lb (88.5 kg).  Risk Assessment: Allergies: Reviewed. He has No Known Allergies.  Allergy Precautions: None required Coagulopathies: Reviewed. None identified.  Blood-thinner therapy: None at this time Active Infection(s): Reviewed. None identified. Kenneth Osborn is afebrile  Site Confirmation: Kenneth Osborn was asked to confirm the procedure and laterality before marking the site Procedure checklist: Completed Consent: Before the procedure and under the influence of no sedative(s), amnesic(s), or anxiolytics, the patient was informed of the treatment options, risks and possible complications. To fulfill our ethical and legal obligations, as recommended by the American Medical Association's Code of Ethics, I have informed the patient of my clinical impression; the nature and purpose of the treatment or procedure; the risks, benefits, and possible complications of the intervention; the alternatives, including doing nothing; the risk(s) and benefit(s) of the alternative treatment(s) or procedure(s); and the risk(s) and benefit(s) of doing nothing. The patient was provided information about the general risks and possible complications associated with the procedure. These may include, but are not limited to: failure to achieve desired goals, infection, bleeding, organ or nerve damage, allergic reactions, paralysis, and death. In addition, the patient was informed of those risks and complications associated to Spine-related procedures, such as failure to decrease pain; infection (i.e.: Meningitis, epidural or intraspinal abscess); bleeding (i.e.: epidural hematoma,  subarachnoid hemorrhage, or any other type of intraspinal or peri-dural bleeding); organ or nerve damage (i.e.: Any type of peripheral nerve, nerve root, or spinal cord injury) with subsequent damage to sensory, motor, and/or autonomic systems, resulting in permanent pain, numbness, and/or weakness of one or several areas of the body; allergic reactions; (i.e.: anaphylactic reaction); and/or death. Furthermore, the patient was informed of those risks and complications associated with the medications. These  include, but are not limited to: allergic reactions (i.e.: anaphylactic or anaphylactoid reaction(s)); adrenal axis suppression; blood sugar elevation that in diabetics may result in ketoacidosis or comma; water retention that in patients with history of congestive heart failure may result in shortness of breath, pulmonary edema, and decompensation with resultant heart failure; weight gain; swelling or edema; medication-induced neural toxicity; particulate matter embolism and blood vessel occlusion with resultant organ, and/or nervous system infarction; and/or aseptic necrosis of one or more joints. Finally, the patient was informed that Medicine is not an exact science; therefore, there is also the possibility of unforeseen or unpredictable risks and/or possible complications that may result in a catastrophic outcome. The patient indicated having understood very clearly. We have given the patient no guarantees and we have made no promises. Enough time was given to the patient to ask questions, all of which were answered to the patient's satisfaction. Kenneth Osborn has indicated that he wanted to continue with the procedure. Attestation: I, the ordering provider, attest that I have discussed with the patient the benefits, risks, side-effects, alternatives, likelihood of achieving goals, and potential problems during recovery for the procedure that I have provided informed consent. Date   Time: 02/20/2021 10:03  AM  Pre-Procedure Preparation:  Monitoring: As per clinic protocol. Respiration, ETCO2, SpO2, BP, heart rate and rhythm monitor placed and checked for adequate function Safety Precautions: Patient was assessed for positional comfort and pressure points before starting the procedure. Time-out: I initiated and conducted the "Time-out" before starting the procedure, as per protocol. The patient was asked to participate by confirming the accuracy of the "Time Out" information. Verification of the correct person, site, and procedure were performed and confirmed by me, the nursing staff, and the patient. "Time-out" conducted as per Joint Commission's Universal Protocol (UP.01.01.01). Time: 1013  Description of Procedure:          Laterality: Left Levels:  L2, L3, L4, L5, & S1 Medial Branch Level(s), at the L3-4, L4-5, and the L5-S1 lumbar facet joints. Area Prepped: Lumbosacral DuraPrep (Iodine Povacrylex [0.7% available iodine] and Isopropyl Alcohol, 74% w/w) Safety Precautions: Aspiration looking for blood return was conducted prior to all injections. At no point did we inject any substances, as a needle was being advanced. Before injecting, the patient was told to immediately notify me if he was experiencing any new onset of "ringing in the ears, or metallic taste in the mouth". No attempts were made at seeking any paresthesias. Safe injection practices and needle disposal techniques used. Medications properly checked for expiration dates. SDV (single dose vial) medications used. After the completion of the procedure, all disposable equipment used was discarded in the proper designated medical waste containers. Local Anesthesia: Protocol guidelines were followed. The patient was positioned over the fluoroscopy table. The area was prepped in the usual manner. The time-out was completed. The target area was identified using fluoroscopy. A 12-in long, straight, sterile hemostat was used with fluoroscopic  guidance to locate the targets for each level blocked. Once located, the skin was marked with an approved surgical skin marker. Once all sites were marked, the skin (epidermis, dermis, and hypodermis), as well as deeper tissues (fat, connective tissue and muscle) were infiltrated with a small amount of a short-acting local anesthetic, loaded on a 10cc syringe with a 25G, 1.5-in  Needle. An appropriate amount of time was allowed for local anesthetics to take effect before proceeding to the next step. Local Anesthetic: Lidocaine 2.0% The unused portion of the local anesthetic  was discarded in the proper designated containers. Technical explanation of process:  Radiofrequency Ablation (RFA) L2 Medial Branch Nerve RFA: The target area for the L2 medial branch is at the junction of the postero-lateral aspect of the superior articular process and the superior, posterior, and medial edge of the transverse process of L3. Under fluoroscopic guidance, a Radiofrequency needle was inserted until contact was made with os over the superior postero-lateral aspect of the pedicular shadow (target area). Sensory and motor testing was conducted to properly adjust the position of the needle. Once satisfactory placement of the needle was achieved, the numbing solution was slowly injected after negative aspiration for blood. 2.0 mL of the nerve block solution was injected without difficulty or complication. After waiting for at least 3 minutes, the ablation was performed. Once completed, the needle was removed intact. L3 Medial Branch Nerve RFA: The target area for the L3 medial branch is at the junction of the postero-lateral aspect of the superior articular process and the superior, posterior, and medial edge of the transverse process of L4. Under fluoroscopic guidance, a Radiofrequency needle was inserted until contact was made with os over the superior postero-lateral aspect of the pedicular shadow (target area). Sensory and  motor testing was conducted to properly adjust the position of the needle. Once satisfactory placement of the needle was achieved, the numbing solution was slowly injected after negative aspiration for blood. 2.0 mL of the nerve block solution was injected without difficulty or complication. After waiting for at least 3 minutes, the ablation was performed. Once completed, the needle was removed intact. L4 Medial Branch Nerve RFA: The target area for the L4 medial branch is at the junction of the postero-lateral aspect of the superior articular process and the superior, posterior, and medial edge of the transverse process of L5. Under fluoroscopic guidance, a Radiofrequency needle was inserted until contact was made with os over the superior postero-lateral aspect of the pedicular shadow (target area). Sensory and motor testing was conducted to properly adjust the position of the needle. Once satisfactory placement of the needle was achieved, the numbing solution was slowly injected after negative aspiration for blood. 2.0 mL of the nerve block solution was injected without difficulty or complication. After waiting for at least 3 minutes, the ablation was performed. Once completed, the needle was removed intact. L5 Medial Branch Nerve RFA: The target area for the L5 medial branch is at the junction of the postero-lateral aspect of the superior articular process of S1 and the superior, posterior, and medial edge of the sacral ala. Under fluoroscopic guidance, a Radiofrequency needle was inserted until contact was made with os over the superior postero-lateral aspect of the pedicular shadow (target area). Sensory and motor testing was conducted to properly adjust the position of the needle. Once satisfactory placement of the needle was achieved, the numbing solution was slowly injected after negative aspiration for blood. 2.0 mL of the nerve block solution was injected without difficulty or complication. After  waiting for at least 3 minutes, the ablation was performed. Once completed, the needle was removed intact. S1 Medial Branch Nerve RFA: The target area for the S1 medial branch is located inferior to the junction of the S1 superior articular process and the L5 inferior articular process, posterior, inferior, and lateral to the 6 o'clock position of the L5-S1 facet joint, just superior to the S1 posterior foramen. Under fluoroscopic guidance, the Radiofrequency needle was advanced until contact was made with os over the Target area. Sensory  and motor testing was conducted to properly adjust the position of the needle. Once satisfactory placement of the needle was achieved, the numbing solution was slowly injected after negative aspiration for blood. 2.0 mL of the nerve block solution was injected without difficulty or complication. After waiting for at least 3 minutes, the ablation was performed. Once completed, the needle was removed intact. Radiofrequency lesioning (ablation):  Radiofrequency Generator: NeuroTherm [id number] Sensory Stimulation Parameters: 50 Hz was used to locate & identify the nerve, making sure that the needle was positioned such that there was no sensory stimulation below 0.3 V or above 0.7 V. Motor Stimulation Parameters: 2 Hz was used to evaluate the motor component. Care was taken not to lesion any nerves that demonstrated motor stimulation of the lower extremities at an output of less than 2.5 times that of the sensory threshold, or a maximum of 2.0 V. Lesioning Technique Parameters: Standard Radiofrequency settings. (Not bipolar or pulsed.) Temperature Settings: 80 degrees C Lesioning time: 60 seconds Intra-operative Compliance: Compliant, but somewhat sedated, presumably from medications taken at home. Materials & Medications: Needle(s) (Electrode/Cannula) Type: Teflon-coated, curved tip, Radiofrequency needle(s) Gauge: 22G Length: 10cm Numbing solution: 0.2% PF-Ropivacaine +  Triamcinolone (40 mg/mL) diluted to a final concentration of 4 mg of Triamcinolone/mL of Ropivacaine The unused portion of the solution was discarded in the proper designated containers.  Once the entire procedure was completed, the treated area was cleaned, making sure to leave some of the prepping solution back to take advantage of its long term bactericidal properties.    Illustration of the posterior view of the lumbar spine and the posterior neural structures. Laminae of L2 through S1 are labeled. DPRL5, dorsal primary ramus of L5; DPRS1, dorsal primary ramus of S1; DPR3, dorsal primary ramus of L3; FJ, facet (zygapophyseal) joint L3-L4; I, inferior articular process of L4; LB1, lateral branch of dorsal primary ramus of L1; IAB, inferior articular branches from L3 medial branch (supplies L4-L5 facet joint); IBP, intermediate branch plexus; MB3, medial branch of dorsal primary ramus of L3; NR3, third lumbar nerve root; S, superior articular process of L5; SAB, superior articular branches from L4 (supplies L4-5 facet joint also); TP3, transverse process of L3.  Vitals:   02/20/21 1046 02/20/21 1051 02/20/21 1056 02/20/21 1100  BP: 113/69 111/74 111/68 100/78  Pulse:      Resp: 17 18 18 19   Temp:      TempSrc:      SpO2: 96% 96% 96% 96%  Weight:      Height:       Start Time: 1013 hrs. End Time: 1100 hrs.  Imaging Guidance (Spinal):          Type of Imaging Technique: Fluoroscopy Guidance (Spinal) Indication(s): Assistance in needle guidance and placement for procedures requiring needle placement in or near specific anatomical locations not easily accessible without such assistance. Exposure Time: Please see nurses notes. Contrast: None used. Fluoroscopic Guidance: I was personally present during the use of fluoroscopy. "Tunnel Vision Technique" used to obtain the best possible view of the target area. Parallax error corrected before commencing the procedure. "Direction-depth-direction"  technique used to introduce the needle under continuous pulsed fluoroscopy. Once target was reached, antero-posterior, oblique, and lateral fluoroscopic projection used confirm needle placement in all planes. Images permanently stored in EMR. Interpretation: No contrast injected. I personally interpreted the imaging intraoperatively. Adequate needle placement confirmed in multiple planes. Permanent images saved into the patient's record.  Antibiotic Prophylaxis:   Anti-infectives (From admission, onward)  None      Indication(s): None identified  Post-operative Assessment:  Post-procedure Vital Signs:  Pulse/HCG Rate: 7263 Temp: (!) 97.2 F (36.2 C) Resp: 19 BP: 100/78 SpO2: 96 %  EBL: None  Complications: No immediate post-treatment complications observed by team, or reported by patient.  Note: The patient tolerated the entire procedure well. A repeat set of vitals were taken after the procedure and the patient was kept under observation following institutional policy, for this type of procedure. Post-procedural neurological assessment was performed, showing return to baseline, prior to discharge. The patient was provided with post-procedure discharge instructions, including a section on how to identify potential problems. Should any problems arise concerning this procedure, the patient was given instructions to immediately contact us, at any time, without hesitation. In any case, we plan to contact the patient by telephone for a follow-up status report regarding this interventional procedure.  Comments:  No additional relevant information.  Plan of Care  Orders:  Orders Placed This Encounter  Procedures   Radiofrequency,Lumbar    Scheduling Instructions:     Side(s): Left-sided     Level: L3-4, L4-5, & L5-S1 Facets (L2, L3, L4, L5, & S1 Medial Branch Nerves)     Sedation: Patient's choice.     Timeframe: Today    Order Specific Question:   Where will this procedure be  performed?    Answer:   [facility]   DG [facility] C-ARM 1-60 MIN NO REPORT    Intraoperative interpretation by procedural physician at [facility].    Standing Status:   Standing    Number of Occurrences:   1    Order Specific Question:   Reason for exam:    Answer:   Assistance in needle guidance and placement for procedures requiring needle placement in or near specific anatomical locations not easily accessible without such assistance.   Informed Consent Details: Physician/Practitioner Attestation; Transcribe to consent form and obtain patient signature    Nursing Order: Transcribe to consent form and obtain patient signature. Note: Always confirm laterality of pain with Mr. Maddy, before procedure.    Order Specific Question:   Physician/Practitioner attestation of informed consent for procedure/surgical case    Answer:   I, the physician/practitioner, attest that I have discussed with the patient the benefits, risks, side effects, alternatives, likelihood of achieving goals and potential problems during recovery for the procedure that I have provided informed consent.    Order Specific Question:   Procedure    Answer:   Lumbar Facet Radiofrequency Ablation    Order Specific Question:   Physician/Practitioner performing the procedure    Answer:   Syble Picco A. Dossie Arbour, MD    Order Specific Question:   Indication/Reason    Answer:   Low Back Pain, with our without leg pain, due to Facet Joint Arthralgia (Joint Pain) known as Lumbar Facet Syndrome, secondary to Lumbar, and/or Lumbosacral Spondylosis (Arthritis of the Spine), without myelopathy or radiculopathy (Nerve Damage).   Care order/instruction: Please confirm that the patient has stopped the Effient (Prasugrel) x 10 days prior to procedure or surgery.    Please confirm that the patient has stopped the Effient (Prasugrel) x 10 days prior to procedure or surgery.    Standing Status:   Standing    Number of  Occurrences:   1   Provide equipment / supplies at bedside    "Radiofrequency Tray"; Large hemostat (x1); Small hemostat (x1); Towels (x8); 4x4 sterile sponge pack (x1) Needle type: Teflon-coated  Radiofrequency Needle (Disposable   single use) Size: Long Quantity: 5    Standing Status:   Standing    Number of Occurrences:   1    Order Specific Question:   Specify    Answer:   Radiofrequency Tray   Bleeding precautions    Standing Status:   Standing    Number of Occurrences:   1   Chronic Opioid Analgesic:  Oxycodone IR 5 mg, 1 tab p.o. every 4 hours (30 mg/day of oxycodone) MME/day: 45 mg/day.   Medications ordered for procedure: Meds ordered this encounter  Medications   lidocaine (XYLOCAINE) 2 % (with pres) injection 400 mg   pentafluoroprop-tetrafluoroeth (GEBAUERS) aerosol   ropivacaine (PF) 2 mg/mL (0.2%) (NAROPIN) injection 9 mL   triamcinolone acetonide (KENALOG-40) injection 40 mg   HYDROcodone-acetaminophen (NORCO/VICODIN) 5-325 MG tablet    Sig: Take 1 tablet by mouth every 8 (eight) hours as needed for up to 7 days for severe pain. Must last 7 days.    Dispense:  21 tablet    Refill:  0    For acute post-operative pain. Not to be refilled. Must last 7 days.   HYDROcodone-acetaminophen (NORCO/VICODIN) 5-325 MG tablet    Sig: Take 1 tablet by mouth every 8 (eight) hours as needed for up to 7 days for severe pain. Must last for 7 days.    Dispense:  21 tablet    Refill:  0    For acute post-operative pain. Not to be refilled. Must last 7 days.   lactated ringers infusion 1,000 mL   midazolam (VERSED) 5 MG/5ML injection 0.5-2 mg    Make sure Flumazenil is available in the pyxis when using this medication. If oversedation occurs, administer 0.2 mg IV over 15 sec. If after 45 sec no response, administer 0.2 mg again over 1 min; may repeat at 1 min intervals; not to exceed 4 doses (1 mg)   Medications administered: We administered lidocaine, ropivacaine (PF) 2 mg/mL (0.2%),  triamcinolone acetonide, and lactated ringers.  See the medical record for exact dosing, route, and time of administration.  Follow-up plan:   Return in about 6 weeks (around 04/03/2021) for Proc-day (T,Th), (VV), (PPE).       Interventional Therapies  Risk   Complexity Considerations:   Estimated body mass index is 28.94 kg/m as calculated from the following:   Height as of this encounter: 5' 9"  (1.753 m).   Weight as of this encounter: 196 lb (88.9 kg). NOTE: EFFIENT Anticoagulation (Stop: 7-10 days   Re-start: 6 hrs)   Planned   Pending:   Palliative left lumbar facet RFA #3 (02/20/2021)    Under consideration:      Completed:   Palliative right PSIS MNB/TPI x3 (11/13/2017)  Diagnostic/palliative right lumbar facet MBB x2 (09/22/2018)  Diagnostic/palliative left lumbar facet MBB x2 (09/22/2018)  Palliative left lumbar facet RFA x3 (02/20/2021)  Palliative right lumbar facet RFA x3 (01/09/2021)    Therapeutic   Palliative (PRN) options:   Palliative right PSIS MNB/TPI  Diagnostic/palliative lumbar facet MBB  Palliative lumbar facet RFA     Recent Visits Date Type Provider Dept  01/09/21 Procedure visit Milinda Pointer, MD [facility]  12/18/20 Office Visit Milinda Pointer, MD [facility]  Showing recent visits within past 90 days and meeting all other requirements Today's Visits Date Type Provider Dept  02/20/21 Procedure visit Milinda Pointer, MD [facility]  Showing today's visits and meeting all other requirements Future Appointments Date Type  Provider Dept  03/19/21 Appointment Milinda Pointer, MD [facility]  04/03/21 Appointment Milinda Pointer, MD [facility]  Showing future appointments within next 90 days and meeting all other requirements  Disposition: Discharge home  Discharge (Date   Time): 02/20/2021; 1110 hrs.   Primary Care Physician: [name], MD Location: [facility]  Facility Note by: Gaspar Cola, MD Date: 02/20/2021; Time: 11:43 AM  Disclaimer:  Medicine is not an Chief Strategy Officer. The only guarantee in medicine is that nothing is guaranteed. It is important to note that the decision to proceed with this intervention was based on the information collected from the patient. The Data and conclusions were drawn from the patient's questionnaire, the interview, and the physical examination. Because the information was provided in large part by the patient, it cannot be guaranteed that it has not been purposely or unconsciously manipulated. Every effort has been made to obtain as much relevant data as possible for this evaluation. It is important to note that the conclusions that lead to this procedure are derived in large part from the available data. Always take into account that the treatment will also be dependent on availability of resources and existing treatment guidelines, considered by other Pain Management Practitioners as being common knowledge and practice, at the time of the intervention. For Medico-Legal purposes, it is also important to point out that variation in procedural techniques and pharmacological choices are the acceptable norm. The indications, contraindications, technique, and results of the above procedure should only be interpreted and judged by a Board-Certified Interventional Pain Specialist with extensive familiarity and expertise in the same exact procedure and technique.

## 2021-02-20 ENCOUNTER — Ambulatory Visit
Admission: RE | Admit: 2021-02-20 | Discharge: 2021-02-20 | Disposition: A | Discharge status: Home / Self Care | Payer: Medicare Other | Source: Ambulatory Visit | Attending: Pain Medicine | Admitting: Pain Medicine

## 2021-02-20 ENCOUNTER — Other Ambulatory Visit: Payer: Self-pay

## 2021-02-20 ENCOUNTER — Ambulatory Visit (HOSPITAL_BASED_OUTPATIENT_CLINIC_OR_DEPARTMENT_OTHER): Payer: Medicare Other | Admitting: Pain Medicine

## 2021-02-20 ENCOUNTER — Encounter: Payer: Self-pay | Admitting: Pain Medicine

## 2021-02-20 VITALS — BP 100/78 | HR 72 | Temp 97.2°F | Resp 19 | Ht 69.0 in | Wt 195.0 lb

## 2021-02-20 DIAGNOSIS — Z7901 Long term (current) use of anticoagulants: Secondary | ICD-10-CM | POA: Insufficient documentation

## 2021-02-20 DIAGNOSIS — M961 Postlaminectomy syndrome, not elsewhere classified: Secondary | ICD-10-CM | POA: Diagnosis present

## 2021-02-20 DIAGNOSIS — G8918 Other acute postprocedural pain: Secondary | ICD-10-CM

## 2021-02-20 DIAGNOSIS — M47816 Spondylosis without myelopathy or radiculopathy, lumbar region: Secondary | ICD-10-CM

## 2021-02-20 DIAGNOSIS — M545 Low back pain, unspecified: Secondary | ICD-10-CM | POA: Diagnosis not present

## 2021-02-20 DIAGNOSIS — M47817 Spondylosis without myelopathy or radiculopathy, lumbosacral region: Secondary | ICD-10-CM

## 2021-02-20 DIAGNOSIS — M5137 Other intervertebral disc degeneration, lumbosacral region: Secondary | ICD-10-CM

## 2021-02-20 DIAGNOSIS — G8929 Other chronic pain: Secondary | ICD-10-CM | POA: Diagnosis present

## 2021-02-20 MED ORDER — LACTATED RINGERS IV SOLN
1000.0000 mL | Freq: Once | INTRAVENOUS | Status: AC
  Administered 2021-02-20: 1000 mL via INTRAVENOUS

## 2021-02-20 MED ORDER — LIDOCAINE HCL 2 % IJ SOLN
INTRAMUSCULAR | Status: AC
  Filled 2021-02-20: qty 20

## 2021-02-20 MED ORDER — TRIAMCINOLONE ACETONIDE 40 MG/ML IJ SUSP
INTRAMUSCULAR | Status: AC
  Filled 2021-02-20: qty 1

## 2021-02-20 MED ORDER — LIDOCAINE HCL 2 % IJ SOLN
20.0000 mL | Freq: Once | INTRAMUSCULAR | Status: AC
  Administered 2021-02-20: 400 mg

## 2021-02-20 MED ORDER — MIDAZOLAM HCL 5 MG/5ML IJ SOLN: 0.5000 mg | Freq: Once | INTRAMUSCULAR | Status: DC

## 2021-02-20 MED ORDER — MIDAZOLAM HCL 5 MG/5ML IJ SOLN
INTRAMUSCULAR | Status: AC
  Filled 2021-02-20: qty 5

## 2021-02-20 MED ORDER — TRIAMCINOLONE ACETONIDE 40 MG/ML IJ SUSP
40.0000 mg | Freq: Once | INTRAMUSCULAR | Status: AC
  Administered 2021-02-20: 40 mg

## 2021-02-20 MED ORDER — HYDROCODONE-ACETAMINOPHEN 5-325 MG PO TABS: 1.0000 | ORAL_TABLET | Freq: Three times a day (TID) | ORAL | 0 refills | Status: AC | PRN

## 2021-02-20 MED ORDER — ROPIVACAINE HCL 2 MG/ML IJ SOLN
9.0000 mL | Freq: Once | INTRAMUSCULAR | Status: AC
  Administered 2021-02-20: 9 mL via PERINEURAL

## 2021-02-20 MED ORDER — ROPIVACAINE HCL 2 MG/ML IJ SOLN
INTRAMUSCULAR | Status: AC
  Filled 2021-02-20: qty 20

## 2021-02-20 MED ORDER — PENTAFLUOROPROP-TETRAFLUOROETH EX AERO
INHALATION_SPRAY | Freq: Once | CUTANEOUS | Status: DC
  Filled 2021-02-20: qty 116

## 2021-02-20 NOTE — Patient Instructions (Signed)
___________________________________________________________________________________________  Post-Radiofrequency (RF) Discharge Instructions  You have just completed a Radiofrequency Neurotomy.  The following instructions will provide you with information and guidelines for self-care upon discharge.  If at any time you have questions or concerns please call your physician. DO NOT DRIVE YOURSELF!!  Instructions: Apply ice: Fill a plastic sandwich bag with crushed ice. Cover it with a small towel and apply to injection site. Apply for 15 minutes then remove x 15 minutes. Repeat sequence on day of procedure, until you go to bed. The purpose is to minimize swelling and discomfort after procedure. Apply heat: Apply heat to procedure site starting the day following the procedure. The purpose is to treat any soreness and discomfort from the procedure. Food intake: No eating limitations, unless stipulated above.  Nevertheless, if you have had sedation, you may experience some nausea.  In this case, it may be wise to wait at least two hours prior to resuming regular diet. Physical activities: Keep activities to a minimum for the first 8 hours after the procedure. For the first 24 hours after the procedure, do not drive a motor vehicle,  Operate heavy machinery, power tools, or handle any weapons.  Consider walking with the use of an assistive device or accompanied by an adult for the first 24 hours.  Do not drink alcoholic beverages including beer.  Do not make any important decisions or sign any legal documents. Go home and rest today.  Resume activities tomorrow, as tolerated.  Use caution in moving about as you may experience mild leg weakness.  Use caution in cooking, use of household electrical appliances and climbing steps. Driving: If you have received any sedation, you are not allowed to drive for 24 hours after your procedure. Blood thinner: Restart your blood thinner 6 hours after your procedure. (Only  for those taking blood thinners) Insulin: As soon as you can eat, you may resume your normal dosing schedule. (Only for those taking insulin) Medications: May resume pre-procedure medications.  Do not take any drugs, other than what has been prescribed to you. Infection prevention: Keep procedure site clean and dry. Post-procedure Pain Diary: Extremely important that this be done correctly and accurately. Recorded information will be used to determine the next step in treatment. Pain evaluated is that of treated area only. Do not include pain from an untreated area. Complete every hour, on the hour, for the initial 8 hours. Set an alarm to help you do this part accurately. Do not go to sleep and have it completed later. It will not be accurate. Follow-up appointment: Keep your follow-up appointment after the procedure. Usually 2-6 weeks after radiofrequency. Bring you pain diary. The information collected will be essential for your long-term care.   Expect: From numbing medicine (AKA: Local Anesthetics): Numbness or decrease in pain. Onset: Full effect within 15 minutes of injected. Duration: It will depend on the type of local anesthetic used. On the average, 1 to 8 hours.  From steroids (when added): Decrease in swelling or inflammation. Once inflammation is improved, relief of the pain will follow. Onset of benefits: Depends on the amount of swelling present. The more swelling, the longer it will take for the benefits to be seen. In some cases, up to 10 days. Duration: Steroids will stay in the system x 2 weeks. Duration of benefits will depend on multiple posibilities including persistent irritating factors. From procedure: Some discomfort is to be expected once the numbing medicine wears off. In the case of radiofrequency procedures,  this may last as long as 6 weeks. Additional post-procedure pain medication is provided for this. Discomfort is minimized if ice and heat are applied as  instructed.  Call if: You experience numbness and weakness that gets worse with time, as opposed to wearing off. He experience any unusual bleeding, difficulty breathing, or loss of the ability to control your bowel and bladder. (This applies to Spinal procedures only) You experience any redness, swelling, heat, red streaks, elevated temperature, fever, or any other signs of a possible infection.  Emergency Numbers: Meta business hours (Monday - Thursday, 8:00 AM - 4:00 PM) (Friday, 9:00 AM - 12:00 Noon): [phone] After hours: [phone] ____________________________________________________________________________________________

## 2021-02-21 ENCOUNTER — Telehealth: Payer: Self-pay

## 2021-02-21 NOTE — Telephone Encounter (Signed)
Post procedure phone call.  Patient states he is doing good except that his blood sugar is up.  Instructed him to keep an eye on his blood sugar and call PCP if needed.

## 2021-03-18 NOTE — Progress Notes (Signed)
PROVIDER NOTE: Information contained herein reflects review and annotations entered in association with encounter. Interpretation of such information and data should be left to medically-trained personnel. Information provided to patient can be located elsewhere in the medical record under "Patient Instructions". Document created using STT-dictation technology, any transcriptional errors that may result from process are unintentional.    Patient: Kenneth Osborn  Service Category: E/M  Provider: Gaspar Cola, MD  DOB: 11/14/1955  DOS: 03/19/2021  Specialty: Interventional Pain Management  MRN: [id number]  Setting: Ambulatory outpatient  PCP: Kenneth Pink, MD  Type: Established Patient    Referring Provider: [name], MD  Location: Office  Delivery: Face-to-face     HPI  [name], a 66 y.o. year old male, is here today because of his Chronic pain syndrome [G89.4]. [name] primary complain today is Back Pain (Mid and lower) Last encounter: My last encounter with him was on 02/20/2021. Pertinent problems: 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 Osborn at [facility] on 01/04/2013); Neurogenic pain; Thoracic facet syndrome (T8-10); Lumbar facet syndrome (Bilateral) (R>L); Cervical facet syndrome (Right); Cervical spondylosis; Lumbar spondylosis; Chronic upper extremity pain (Left); Chronic cervical radicular pain (Left); Chronic upper back pain; History of thoracic spine surgery (S/P T9-10 IVD spacer); Failed back surgical syndrome; Chronic musculoskeletal pain; Chronic lower extremity pain (Left); Chronic radicular lumbar pain (left L4 dermatomal pain); Arthralgia of shoulder; Chronic tension-type headache, not intractable; 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); Chronic pain syndrome;  Headache disorder; Trigger point posterior superior iliac spine (PSIS) (Right); 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 Pain Assessment: Severity of Chronic pain is reported as a 1 /10. Location: Back Mid, Lower/legs at times. Onset: More than a month ago. Quality: Pressure (intense). Timing: Constant. Modifying factor(s): medications, rest, ice, heat, procedures. Vitals:  height is 5' 9" (1.753 m) and weight is 200 lb (90.7 kg). His temporal temperature is 97 F (36.1 C) (abnormal). His blood pressure is 134/68 and his pulse is 83. His respiration is 18 and oxygen saturation is 99%.   Reason for encounter: medication management.   The patient indicates doing well with the current medication regimen. No adverse reactions or side effects reported to the medications.   Today the patient refers that the only issue that he is having is that regarding the Flexeril that was transferred to his PCP, he is getting enough for 1 tablet/day.  He also describes taking gabapentin, Valium, and alprazolam.  I have explained to him that this is precisely the reason why anybody would feel very uncomfortable prescribing any more of the Flexeril or any other medication to him.  I have reminded him that he should never take any of the Valium or alprazolam within 8 hours of having taking the oxycodone.  He refers that he is constantly reminded of this, not only by [name] but also by the pharmacist.  Over time he has developed quite a bit of  tolerance to opioids and benzodiazepines.  Parenterally  we will need to address this by way of a drug holiday.  RTCB: 06/18/2021 Nonopioids transferred 11/22/2019: Magnesium, Flexeril, and Neurontin.  Post-procedure evaluation     Procedure:          Anesthesia, Analgesia, Anxiolysis:  Type: Thermal Lumbar Facet, Medial Branch Radiofrequency Ablation/Neurotomy  #3  Primary Purpose: Therapeutic Region: Posterolateral Lumbosacral Spine Level: L2, L3, L4, L5, & S1 Medial Branch Level(s). These levels will denervate the L3-4, L4-5, and the L5-S1 lumbar facet joints. Laterality: Left (02/20/2021) right (01/09/2021)  Anesthesia: Local (1-2% Lidocaine)  Anxiolysis: IV (Versed 1 mg) Sedation: None  Guidance: Fluoroscopy           Position: Prone   Indications: 1. Lumbar facet syndrome (Bilateral) (R>L)   2. Spondylosis without myelopathy or radiculopathy, lumbosacral region   3. Chronic low back pain (Bilateral) w/o sciatica   4. DDD (degenerative disc disease), lumbosacral   5. Failed back surgical syndrome   6. Chronic anticoagulation (Effient)    [name] has been dealing with the above chronic pain for longer than three months and has either failed to respond, was unable to tolerate, or simply did not get enough benefit from other more conservative therapies including, but not limited to: 1. Over-the-counter medications 2. Anti-inflammatory medications 3. Muscle relaxants 4. Membrane stabilizers 5. Opioids 6. Physical therapy and/or chiropractic manipulation 7. Modalities (Heat, ice, etc.) 8. Invasive techniques such as nerve blocks. [name] has attained more than 50% relief of the pain from a series of diagnostic injections conducted in separate occasions.  Pain Score: Pre-procedure: 3 /10 Post-procedure: 0-No pain/10     Effectiveness:  Initial hour after procedure: 85 %. Subsequent 4-6 hours post-procedure: 85 %. Analgesia past initial 6 hours: 55 %. Ongoing improvement:   Analgesic: The patient indicates currently having a 60 to 70% ongoing relief of the pain in the lower back.  He refers that it is better than it was before.  He still having some pain, but appears to be more tolerable. Function: Mr. Chesnut reports improvement in function ROM: Mr. Moltz reports improvement in ROM  Pharmacotherapy Assessment  Analgesic: Oxycodone IR 5 mg, 1 tab p.o. every 4 hours (30 mg/day of oxycodone) MME/day: 45 mg/day.   Monitoring: [place] PMP: PDMP reviewed during this encounter.       Pharmacotherapy: No side-effects or adverse reactions reported. Compliance: No problems identified. Effectiveness: Clinically acceptable.  Hart Rochester, RN  03/19/2021 10:00 AM  Signed Nursing Pain Medication Assessment:  Safety precautions to be maintained throughout the outpatient stay will include: orient to surroundings, keep bed in low position, maintain call bell within reach at all times, provide assistance with transfer out of bed and ambulation.  Medication Inspection Compliance: Pill count conducted under aseptic conditions, in front of the patient. Neither the pills nor the bottle was removed from the patient's sight at any time. Once count was completed pills were immediately returned to the patient in their original bottle.  Medication: Oxycodone IR Pill/Patch Count:  169 of 180 pills remain Pill/Patch Appearance: Markings consistent with prescribed medication Bottle Appearance: Standard pharmacy container. Clearly labeled. Filled Date: 01 / 26 / 2023 Last Medication intake:  Today    UDS:  Summary  Date Value Ref Range Status  06/19/2020 Note  Final    Comment:    ==================================================================== [employer] 13 (MW) ==================================================================== Test  Result       Flag       Units  Drug Present and Declared for Prescription Verification   Desmethyldiazepam               101          EXPECTED   ng/mg creat   Oxazepam                       713          EXPECTED   ng/mg creat   Temazepam                      259          EXPECTED   ng/mg creat    Desmethyldiazepam, oxazepam, and temazepam are expected metabolites    of diazepam. Desmethyldiazepam and oxazepam are also expected    metabolites of other drugs, including chlordiazepoxide, prazepam,    clorazepate, and halazepam. Oxazepam is an expected metabolite of    temazepam. Oxazepam and temazepam are also available as scheduled    prescription medications.    Alprazolam                     32           EXPECTED   ng/mg creat   Alpha-hydroxyalprazolam        24           EXPECTED   ng/mg creat    Source of alprazolam is a scheduled prescription medication. Alpha-    hydroxyalprazolam is an expected metabolite of alprazolam.    Oxycodone                      1138         EXPECTED   ng/mg creat   Oxymorphone                    2681         EXPECTED   ng/mg creat   Noroxycodone                   637          EXPECTED   ng/mg creat   Noroxymorphone                 435          EXPECTED   ng/mg creat    Sources of oxycodone are scheduled prescription medications.    Oxymorphone, noroxycodone, and noroxymorphone are expected    metabolites of oxycodone. Oxymorphone is also available as a    scheduled prescription medication.  ==================================================================== Test                      Result    Flag   Units      Ref Range   Creatinine              104              mg/dL      >=20 ==================================================================== Declared Medications:  The flagging and interpretation on this report are based on the  following declared medications.  Unexpected results may arise from  inaccuracies in the declared medications.   **Note: The testing scope of this panel includes these medications:   Alprazolam (Xanax)  Diazepam (Valium)   Oxycodone   **Note: The testing scope of  this panel does not include the  following reported medications:   Aspirin  Cetirizine (Zyrtec)  Cyclobenzaprine (Flexeril)  Diltiazem (Cardizem)  Esomeprazole (Nexium)  Ezetimibe (Zetia)  Gabapentin (Neurontin)  Glucagon  Insulin  Magnesium (Mag-Ox)  Meclizine (Antivert)  Metoprolol (Toprol)  Montelukast (Singulair)  Niacin  Polyethylene Glycol (MiraLAX)  Prasugrel (Effient)  Rosuvastatin (Crestor)  Sennosides (Senokot) ==================================================================== For clinical consultation, please call [phone]. ====================================================================      ROS  Constitutional: Denies any fever or chills Gastrointestinal: No reported hemesis, hematochezia, vomiting, or acute GI distress Musculoskeletal: Denies any acute onset joint swelling, redness, loss of ROM, or weakness Neurological: No reported episodes of acute onset apraxia, aphasia, dysarthria, agnosia, amnesia, paralysis, loss of coordination, or loss of consciousness  Medication Review  ALPRAZolam, Glucagon, Magnesium Oxide, aspirin EC, cetirizine, cyclobenzaprine, diazepam, diltiazem, enalapril, esomeprazole, ezetimibe, gabapentin, insulin regular human CONCENTRATED, meclizine, metFORMIN, metoprolol succinate, montelukast, naloxegol oxalate, niacin, nitroGLYCERIN, oxyCODONE, polyethylene glycol, prasugrel, rosuvastatin, and senna-docusate  History Review  Allergy: Mr. Kovalcik has No Known Allergies. Drug: Mr. Jobe  reports no history of drug use. Alcohol:  reports no history of alcohol use. Tobacco:  reports that he has never smoked. He has never used smokeless tobacco. Social: Mr. Orlick  reports that he has never smoked. He has never used smokeless tobacco. He reports that he does not drink alcohol and does not use drugs. 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), Anginal pain ([place]), Anxiety, Bronchitis, Can't get food down (08/12/2014), Cancer ([place]) (01/2020), Chronic back pain, Concussion (09/2015), Coronary artery disease, DDD (degenerative disc disease), cervical, Dehydration symptoms, Diabetes mellitus without complication ([place]), Dysphagia, GERD (gastroesophageal reflux disease), History of Meniere's disease (12/21/2014), History of thoracic spine surgery (S/P T9-10 IVD spacer) (12/21/2014), Hypercholesteremia, Hyperlipidemia, Hypertension, Meniere's disease, Myocardial infarction ([place]) (2012), Neuromuscular disorder ([place]), Severe sepsis ([place]) (02/07/2020), Short-segment Barrett's esophagus, and Sleep apnea. Surgical: Mr. Barajas  has a past surgical history that includes Cardiovascular stress test; Cardiac catheterization; Labrinthectomy; mastoid shunt (Bilateral); Back surgery; Shoulder arthroscopy with subacromial decompression (Left, 04/06/2012); ARTHRODESIS ANTERIOR ANTERIOR CERVICLE SPINE (01/04/2013); Colonoscopy with propofol (N/A, 09/19/2014); Esophagogastroduodenoscopy (N/A, 09/19/2014); Savory dilation (N/A, 09/19/2014); Spinal cord stimulator implant (Right); Cardiac catheterization (N/A, 06/15/2015); Cardiac catheterization (N/A, 06/15/2015); Pulse generator implant (N/A, 01/18/2019); Lumbar spinal cord simulator lead removal (Right, 08/09/2019); IR Perc Cholecystostomy (02/09/2020); IR Radiologist Eval & Mgmt (03/14/2020); Coronary angioplasty; Appendectomy; and Robot assisted laparoscopic nephrectomy (Left, 04/20/2020). Family: family history includes Cancer in his sister; Diabetes in his maternal grandmother, mother, and paternal grandmother; Heart disease in his father, maternal aunt, maternal uncle, and mother.  Laboratory Chemistry Profile   Renal Lab Results  Component Value Date   BUN 21 12/11/2020   CREATININE 1.49 (H) 12/11/2020   GFRAA 58 (L) 01/08/2019   GFRNONAA 51 (L) 12/11/2020    Hepatic Lab Results  Component Value Date    AST 18 12/11/2020   ALT 17 12/11/2020   ALBUMIN 4.6 12/11/2020   ALKPHOS 92 12/11/2020   HCVAB NON REACTIVE 02/08/2020   LIPASE 28 12/11/2020   AMMONIA 24 02/07/2020    Electrolytes Lab Results  Component Value Date   NA 138 12/11/2020   K 4.7 12/11/2020   CL 104 12/11/2020   CALCIUM 10.6 (H) 12/11/2020   MG 1.6 (L) 12/11/2020   PHOS 3.1 11/09/2020    Bone No results found for: VD25OH, VD125OH2TOT, SP2330QT6, AU6333LK5, 25OHVITD1, 25OHVITD2, 25OHVITD3, TESTOFREE, TESTOSTERONE  Inflammation (CRP: Acute Phase) (ESR: Chronic Phase) Lab  Results  Component Value Date   CRP 1.4 (H) 03/22/2015   ESRSEDRATE 9 03/22/2015   LATICACIDVEN 1.5 12/11/2020         Note: Above Lab results reviewed.  Recent Imaging Review  DG PAIN CLINIC C-ARM 1-60 MIN NO REPORT Fluoro was used, but no Radiologist interpretation will be provided.  Please refer to "NOTES" tab for provider progress note. Note: Reviewed        Physical Exam  General appearance: Well nourished, well developed, and well hydrated. In no apparent acute distress Mental status: Alert, oriented x 3 (person, place, & time)       Respiratory: No evidence of acute respiratory distress Eyes: PERLA Vitals: BP 134/68    Pulse 83    Temp (!) 97 F (36.1 C) (Temporal)    Resp 18    Ht 5' 9" (1.753 m)    Wt 200 lb (90.7 kg)    SpO2 99%    BMI 29.53 kg/m  BMI: Estimated body mass index is 29.53 kg/m as calculated from the following:   Height as of this encounter: 5' 9" (1.753 m).   Weight as of this encounter: 200 lb (90.7 kg). Ideal: Ideal body weight: 70.7 kg (155 lb 13.8 oz) Adjusted ideal body weight: 78.7 kg (173 lb 8.3 oz)  Assessment   Status Diagnosis  Controlled Improved Improved 1. Chronic pain syndrome   2. Chronic low back pain (Bilateral) w/o sciatica   3. Chronic lower extremity pain (Left)   4. Failed back surgical syndrome   5. Cervical facet syndrome (Right)   6. Chronic upper back pain   7. Failed  cervical surgery syndrome (C5-6 ACDF by Kenneth [facility]born at [facility] on 01/04/2013)   8. Chronic upper extremity pain (Left)   9. Thoracic facet syndrome (T8-10)   10. Chronic musculoskeletal pain   11. Pharmacologic therapy   12. Chronic use of opiate for therapeutic purpose   13. Encounter for medication management      Updated Problems: Problem  Secondary Hyperparathyroidism of Renal Origin (Hcc)    Plan of Care  Problem-specific:  No problem-specific Assessment & Plan notes found for this encounter.  Mr. ISAIYAH FELDHAUS has a current medication list which includes the following long-term medication(s): cetirizine, cyclobenzaprine, esomeprazole, ezetimibe, gabapentin, humulin r u-500 kwikpen, magnesium oxide, metoprolol succinate, montelukast, niacin, [START ON 03/20/2021] oxycodone, [START ON 04/19/2021] oxycodone, and [START ON 05/19/2021] oxycodone.  Pharmacotherapy (Medications Ordered): Meds ordered this encounter  Medications   oxyCODONE (OXY IR/ROXICODONE) 5 MG immediate release tablet    Sig: Take 1 tablet (5 mg total) by mouth every 4 (four) hours as needed for severe pain. Must last 30 days.    Dispense:  180 tablet    Refill:  0    DO NOT: delete (not duplicate); no partial-fill (will deny script to complete), no refill request (F/U required). DISPENSE: 1 day early if closed on fill date. WARN: No CNS-depressants within 8 hrs of med.   oxyCODONE (OXY IR/ROXICODONE) 5 MG immediate release tablet    Sig: Take 1 tablet (5 mg total) by mouth every 4 (four) hours as needed for severe pain. Must last 30 days.    Dispense:  180 tablet    Refill:  0    DO NOT: delete (not duplicate); no partial-fill (will deny script to complete), no refill request (F/U required). DISPENSE: 1 day early if closed on fill date. WARN: No CNS-depressants within 8 hrs of med.   oxyCODONE (OXY  IR/ROXICODONE) 5 MG immediate release tablet    Sig: Take 1 tablet (5 mg total) by mouth every 4 (four) hours as  needed for severe pain. Must last 30 days.    Dispense:  180 tablet    Refill:  0    DO NOT: delete (not duplicate); no partial-fill (will deny script to complete), no refill request (F/U required). DISPENSE: 1 day early if closed on fill date. WARN: No CNS-depressants within 8 hrs of med.   Orders:  No orders of the defined types were placed in this encounter.  Follow-up plan:   Return in about 13 weeks (around 06/18/2021) for Eval-day (M,W), (F2F), (MM).     Interventional Therapies  Risk   Complexity Considerations:   Estimated body mass index is 28.94 kg/m as calculated from the following:   Height as of this encounter: 5' 9" (1.753 m).   Weight as of this encounter: 196 lb (88.9 kg). NOTE: EFFIENT Anticoagulation (Stop: 7-10 days   Re-start: 6 hrs)   Planned   Pending:   Palliative left lumbar facet RFA #3 (02/20/2021)    Under consideration:   CYP2D6 / CYP3A4 genetic testing.   Completed:   Palliative right PSIS MNB/TPI x3 (11/13/2017) (100/100/100)  Diagnostic/palliative right lumbar facet MBB x2 (09/22/2018) (100/100/75/75)  Diagnostic/palliative left lumbar facet MBB x2 (09/22/2018) (100/100/75/75)  Palliative left lumbar facet RFA x3 (02/20/2021) (85/85/55/60-70)  Palliative right lumbar facet RFA x3 (01/09/2021) (100/95/50/60-70)    Therapeutic   Palliative (PRN) options:   Palliative right PSIS MNB/TPI  Diagnostic/palliative lumbar facet MBB  Palliative lumbar facet RFA     Recent Visits Date Type Provider Dept  02/20/21 Procedure visit Milinda Pointer, MD [facility]  01/09/21 Procedure visit Milinda Pointer, MD [facility]  Showing recent visits within past 90 days and meeting all other requirements Today's Visits Date Type Provider Dept  03/19/21 Office Visit Milinda Pointer, MD [facility]  Showing today's visits and meeting all other requirements Future Appointments Date Type Provider Dept  04/03/21 Appointment Milinda Pointer, MD [facility]  06/13/21 Appointment Milinda Pointer, MD [facility]  Showing future appointments within next 90 days and meeting all other requirements  I discussed the assessment and treatment plan with the patient. The patient was provided an opportunity to ask questions and all were answered. The patient agreed with the plan and demonstrated an understanding of the instructions.  Patient advised to call back or seek an in-person evaluation if the symptoms or condition worsens.  Duration of encounter: 30 minutes.  Note by: Gaspar Cola, MD Date: 03/19/2021; Time: 10:35 AM

## 2021-03-19 ENCOUNTER — Ambulatory Visit: Payer: Medicare Other | Attending: Pain Medicine | Admitting: Pain Medicine

## 2021-03-19 ENCOUNTER — Other Ambulatory Visit: Payer: Self-pay

## 2021-03-19 ENCOUNTER — Encounter: Payer: Self-pay | Admitting: Pain Medicine

## 2021-03-19 VITALS — BP 134/68 | HR 83 | Temp 97.0°F | Resp 18 | Ht 69.0 in | Wt 200.0 lb

## 2021-03-19 DIAGNOSIS — M47812 Spondylosis without myelopathy or radiculopathy, cervical region: Secondary | ICD-10-CM | POA: Insufficient documentation

## 2021-03-19 DIAGNOSIS — Z79891 Long term (current) use of opiate analgesic: Secondary | ICD-10-CM | POA: Insufficient documentation

## 2021-03-19 DIAGNOSIS — Z79899 Other long term (current) drug therapy: Secondary | ICD-10-CM | POA: Insufficient documentation

## 2021-03-19 DIAGNOSIS — G894 Chronic pain syndrome: Secondary | ICD-10-CM | POA: Diagnosis present

## 2021-03-19 DIAGNOSIS — M79605 Pain in left leg: Secondary | ICD-10-CM | POA: Diagnosis present

## 2021-03-19 DIAGNOSIS — M545 Low back pain, unspecified: Secondary | ICD-10-CM | POA: Insufficient documentation

## 2021-03-19 DIAGNOSIS — M961 Postlaminectomy syndrome, not elsewhere classified: Secondary | ICD-10-CM | POA: Insufficient documentation

## 2021-03-19 DIAGNOSIS — M7918 Myalgia, other site: Secondary | ICD-10-CM | POA: Insufficient documentation

## 2021-03-19 DIAGNOSIS — M549 Dorsalgia, unspecified: Secondary | ICD-10-CM | POA: Diagnosis present

## 2021-03-19 DIAGNOSIS — M79602 Pain in left arm: Secondary | ICD-10-CM | POA: Insufficient documentation

## 2021-03-19 DIAGNOSIS — M47894 Other spondylosis, thoracic region: Secondary | ICD-10-CM | POA: Diagnosis present

## 2021-03-19 DIAGNOSIS — G8929 Other chronic pain: Secondary | ICD-10-CM | POA: Diagnosis present

## 2021-03-19 MED ORDER — OXYCODONE HCL 5 MG PO TABS: 5.0000 mg | ORAL_TABLET | ORAL | 0 refills | Status: DC | PRN

## 2021-03-19 NOTE — Progress Notes (Signed)
Nursing Pain Medication Assessment:  Safety precautions to be maintained throughout the outpatient stay will include: orient to surroundings, keep bed in low position, maintain call bell within reach at all times, provide assistance with transfer out of bed and ambulation.  Medication Inspection Compliance: Pill count conducted under aseptic conditions, in front of the patient. Neither the pills nor the bottle was removed from the patient's sight at any time. Once count was completed pills were immediately returned to the patient in their original bottle.  Medication: Oxycodone IR Pill/Patch Count:  169 of 180 pills remain Pill/Patch Appearance: Markings consistent with prescribed medication Bottle Appearance: Standard pharmacy container. Clearly labeled. Filled Date: 01 / 26 / 2023 Last Medication intake:  Today

## 2021-03-19 NOTE — Patient Instructions (Signed)
____________________________________________________________________________________________  Medication Rules  Purpose: To inform patients, and their family members, of our rules and regulations.  Applies to: All patients receiving prescriptions (written or electronic).  Pharmacy of record: Pharmacy where electronic prescriptions will be sent. If written prescriptions are taken to a different pharmacy, please inform the nursing staff. The pharmacy listed in the electronic medical record should be the one where you would like electronic prescriptions to be sent.  Electronic prescriptions: In compliance with the [place] (STOP) Act of 2017 (Session Lanny Cramp [phone]), effective February 18, 2018, all controlled substances must be electronically prescribed. Calling prescriptions to the pharmacy will cease to exist.  Prescription refills: Only during scheduled appointments. Applies to all prescriptions.  NOTE: The following applies primarily to controlled substances (Opioid* Pain Medications).   Type of encounter (visit): For patients receiving controlled substances, face-to-face visits are required. (Not an option or up to the patient.)  Patient's responsibilities: Pain Pills: Bring all pain pills to every appointment (except for procedure appointments). Pill Bottles: Bring pills in original pharmacy bottle. Always bring the newest bottle. Bring bottle, even if empty. Medication refills: You are responsible for knowing and keeping track of what medications you take and those you need refilled. The day before your appointment: write a list of all prescriptions that need to be refilled. The day of the appointment: give the list to the admitting nurse. Prescriptions will be written only during appointments. No prescriptions will be written on procedure days. If you forget a medication: it will not be "Called in", "Faxed", or "electronically sent". You will  need to get another appointment to get these prescribed. No early refills. Do not call asking to have your prescription filled early. Prescription Accuracy: You are responsible for carefully inspecting your prescriptions before leaving our office. Have the discharge nurse carefully go over each prescription with you, before taking them home. Make sure that your name is accurately spelled, that your address is correct. Check the name and dose of your medication to make sure it is accurate. Check the number of pills, and the written instructions to make sure they are clear and accurate. Make sure that you are given enough medication to last until your next medication refill appointment. Taking Medication: Take medication as prescribed. When it comes to controlled substances, taking less pills or less frequently than prescribed is permitted and encouraged. Never take more pills than instructed. Never take medication more frequently than prescribed.  Inform other Doctors: Always inform, all of your healthcare providers, of all the medications you take. Pain Medication from other Providers: You are not allowed to accept any additional pain medication from any other Doctor or Healthcare provider. There are two exceptions to this rule. (see below) In the event that you require additional pain medication, you are responsible for notifying us, as stated below. Cough Medicine: Often these contain an opioid, such as codeine or hydrocodone. Never accept or take cough medicine containing these opioids if you are already taking an opioid* medication. The combination may cause respiratory failure and death. Medication Agreement: You are responsible for carefully reading and following our Medication Agreement. This must be signed before receiving any prescriptions from our practice. Safely store a copy of your signed Agreement. Violations to the Agreement will result in no further prescriptions. (Additional copies of our  Medication Agreement are available upon request.) Laws, Rules, & Regulations: All patients are expected to follow all Federal and [employer], [employer], Rules, [employer] [facility]. Ignorance of  the Laws does not constitute a valid excuse.  Illegal drugs and Controlled Substances: The use of illegal substances (including, but not limited to marijuana and its derivatives) and/or the illegal use of any controlled substances is strictly prohibited. Violation of this rule may result in the immediate and permanent discontinuation of any and all prescriptions being written by our practice. The use of any illegal substances is prohibited. Adopted CDC guidelines & recommendations: Target dosing levels will be at or below 60 MME/day. Use of benzodiazepines** is not recommended.  Exceptions: There are only two exceptions to the rule of not receiving pain medications from other Healthcare Providers. Exception #1 (Emergencies): In the event of an emergency (i.e.: accident requiring emergency care), you are allowed to receive additional pain medication. However, you are responsible for: As soon as you are able, call our office [phone], at any time of the day or night, and leave a message stating your name, the date and nature of the emergency, and the name and dose of the medication prescribed. In the event that your call is answered by a member of our staff, make sure to document and save the date, time, and the name of the person that took your information.  Exception #2 (Planned Surgery): In the event that you are scheduled by another doctor or dentist to have any type of surgery or procedure, you are allowed (for a period no longer than 30 days), to receive additional pain medication, for the acute post-op pain. However, in this case, you are responsible for picking up a copy of our "Post-op Pain Management for Surgeons" handout, and giving it to your surgeon or dentist. This document is available at our office, and  does not require an appointment to obtain it. Simply go to our office during business hours (Monday-Thursday from 8:00 AM to 4:00 PM) (Friday 8:00 AM to 12:00 Noon) or if you have a scheduled appointment with [name], prior to your surgery, and ask for it by name. In addition, you are responsible for: calling our office [phone], at any time of the day or night, and leaving a message stating your name, name of your surgeon, type of surgery, and date of procedure or surgery. Failure to comply with your responsibilities may result in termination of therapy involving the controlled substances. Medication Agreement Violation. Following the above rules, including your responsibilities will help you in avoiding a Medication Agreement Violation (Breaking your Pain Medication Contract).  *Opioid medications include: morphine, codeine, oxycodone, oxymorphone, hydrocodone, hydromorphone, meperidine, tramadol, tapentadol, buprenorphine, fentanyl, methadone. **Benzodiazepine medications include: diazepam (Valium), alprazolam (Xanax), clonazepam (Klonopine), lorazepam (Ativan), clorazepate (Tranxene), chlordiazepoxide (Librium), estazolam (Prosom), oxazepam (Serax), temazepam (Restoril), triazolam (Halcion) (Last updated: 11/15/2020) ____________________________________________________________________________________________  ____________________________________________________________________________________________  Medication Recommendations and Reminders  Applies to: All patients receiving prescriptions (written and/or electronic).  Medication Rules & Regulations: These rules and regulations exist for your safety and that of others. They are not flexible and neither are we. Dismissing or ignoring them will be considered "non-compliance" with medication therapy, resulting in complete and irreversible termination of such therapy. (See document titled "Medication Rules" for more details.) In all conscience,  because of safety reasons, we cannot continue providing a therapy where the patient does not follow instructions.  Pharmacy of record:  Definition: This is the pharmacy where your electronic prescriptions will be sent.  We do not endorse any particular pharmacy, however, we have experienced problems with [facility] not securing enough medication supply for the community. We do not restrict you  in your choice of pharmacy. However, once we write for your prescriptions, we will NOT be re-sending more prescriptions to fix restricted supply problems created by your pharmacy, or your insurance.  The pharmacy listed in the electronic medical record should be the one where you want electronic prescriptions to be sent. If you choose to change pharmacy, simply notify our nursing staff.  Recommendations: Keep all of your pain medications in a safe place, under lock and key, even if you live alone. We will NOT replace lost, stolen, or damaged medication. After you fill your prescription, take 1 week's worth of pills and put them away in a safe place. You should keep a separate, properly labeled bottle for this purpose. The remainder should be kept in the original bottle. Use this as your primary supply, until it runs out. Once it's gone, then you know that you have 1 week's worth of medicine, and it is time to come in for a prescription refill. If you do this correctly, it is unlikely that you will ever run out of medicine. To make sure that the above recommendation works, it is very important that you make sure your medication refill appointments are scheduled at least 1 week before you run out of medicine. To do this in an effective manner, make sure that you do not leave the office without scheduling your next medication management appointment. Always ask the nursing staff to show you in your prescription , when your medication will be running out. Then arrange for the receptionist to get you a return appointment,  at least 7 days before you run out of medicine. Do not wait until you have 1 or 2 pills left, to come in. This is very poor planning and does not take into consideration that we may need to cancel appointments due to bad weather, sickness, or emergencies affecting our staff. DO NOT ACCEPT A "Partial Fill": If for any reason your pharmacy does not have enough pills/tablets to completely fill or refill your prescription, do not allow for a "partial fill". The law allows the pharmacy to complete that prescription within 72 hours, without requiring a new prescription. If they do not fill the rest of your prescription within those 72 hours, you will need a separate prescription to fill the remaining amount, which we will NOT provide. If the reason for the partial fill is your insurance, you will need to talk to the pharmacist about payment alternatives for the remaining tablets, but again, DO NOT ACCEPT A PARTIAL FILL, unless you can trust your pharmacist to obtain the remainder of the pills within 72 hours.  Prescription refills and/or changes in medication(s):  Prescription refills, and/or changes in dose or medication, will be conducted only during scheduled medication management appointments. (Applies to both, written and electronic prescriptions.) No refills on procedure days. No medication will be changed or started on procedure days. No changes, adjustments, and/or refills will be conducted on a procedure day. Doing so will interfere with the diagnostic portion of the procedure. No phone refills. No medications will be "called into the pharmacy". No Fax refills. No weekend refills. No Holliday refills. No after hours refills.  Remember:  Business hours are:  Monday to Thursday 8:00 AM to 4:00 PM Provider's Schedule: Milinda Pointer, MD - Appointments are:  Medication management: Monday and Wednesday 8:00 AM to 4:00 PM Procedure day: Tuesday and Thursday 7:30 AM to 4:00 PM Gillis Santa, MD -  Appointments are:  Medication management: Tuesday and Thursday 8:00  AM to 4:00 PM Procedure day: Monday and Wednesday 7:30 AM to 4:00 PM (Last update: 09/08/2019) ____________________________________________________________________________________________  ____________________________________________________________________________________________  CBD (cannabidiol) & Delta-8 (Delta-8 tetrahydrocannabinol) WARNING  Intro: Cannabidiol (CBD) and tetrahydrocannabinol (THC), are two natural compounds found in plants of the Cannabis genus. They can both be extracted from hemp or cannabis. Hemp and cannabis come from the Cannabis sativa plant. Both compounds interact with your bodys endocannabinoid system, but they have very different effects. CBD does not produce the high sensation associated with cannabis. Delta-8 tetrahydrocannabinol, also known as delta-8 THC, is a psychoactive substance found in the Cannabis sativa plant, of which marijuana and hemp are two varieties. THC is responsible for the high associated with the illicit use of marijuana.  Applicable to: All individuals currently taking or considering taking CBD (cannabidiol) and, more important, all patients taking opioid analgesic controlled substances (pain medication). (Example: oxycodone; oxymorphone; hydrocodone; hydromorphone; morphine; methadone; tramadol; tapentadol; fentanyl; buprenorphine; butorphanol; dextromethorphan; meperidine; codeine; etc.)  Legal status: CBD remains a Schedule I drug prohibited for any use. CBD is illegal with one exception. In the [place], CBD has a limited Transport planner (FDA) approval for the treatment of two specific types of epilepsy disorders. Only one CBD product has been approved by the FDA for this purpose: "Epidiolex". FDA is aware that some companies are marketing products containing cannabis and cannabis-derived compounds in ways that violate the [employer], Drug and Cosmetic Act  [facility] Act) and that may put the health and safety of consumers at risk. The FDA, a Federal agency, has not enforced the CBD status since 2018.   Legality: Some manufacturers ship CBD products nationally, which is illegal. Often such products are sold online and are therefore available throughout the country. CBD is openly sold in head shops and health food stores in some states where such sales have not been explicitly legalized. Selling unapproved products with unsubstantiated therapeutic claims is not only a violation of the law, but also can put patients at risk, as these products have not been proven to be safe or effective. Federal illegality makes it difficult to conduct research on CBD.  Reference: "FDA Regulation of Cannabis and Cannabis-Derived Products, Including Cannabidiol (CBD)" - [employer]  Warning: CBD is not FDA approved and has not undergo the same manufacturing controls as prescription drugs.  This means that the purity and safety of available CBD may be questionable. Most of the time, despite manufacturer's claims, it is contaminated with THC (delta-9-tetrahydrocannabinol - the chemical in marijuana responsible for the "HIGH").  When this is the case, the [facility] contaminant will trigger a positive urine drug screen (UDS) test for Marijuana (carboxy-THC). Because a positive UDS for any illicit substance is a violation of our medication agreement, your opioid analgesics (pain medicine) may be permanently discontinued. The FDA recently put out a warning about 5 things that everyone should be aware of regarding Delta-8 THC: Delta-8 THC products have not been evaluated or approved by the FDA for safe use and may be marketed in ways that put the public health at risk. The FDA has received adverse event reports involving delta-8 THC-containing products. Delta-8 THC has  psychoactive and intoxicating effects. Delta-8 THC manufacturing often involve use of potentially harmful chemicals to create the concentrations of delta-8 THC claimed in the marketplace. The final delta-8 THC product may have potentially harmful by-products (contaminants) due to the chemicals used in the process. Manufacturing of delta-8 THC products may occur in uncontrolled or unsanitary settings, which may  lead to the presence of unsafe contaminants or other potentially harmful substances. Delta-8 THC products should be kept out of the reach of children and pets.  MORE ABOUT CBD  General Information: CBD was discovered in [birth year] and it is a derivative of the cannabis sativa genus plants (Marijuana and Hemp). It is one of the 113 identified substances found in Marijuana. It accounts for up to 40% of the plant's extract. As of 2018, preliminary clinical studies on CBD included research for the treatment of anxiety, movement disorders, and pain. CBD is available and consumed in multiple forms, including inhalation of smoke or vapor, as an aerosol spray, and by mouth. It may be supplied as an oil containing CBD, capsules, dried cannabis, or as a liquid solution. CBD is thought not to be as psychoactive as THC (delta-9-tetrahydrocannabinol - the chemical in marijuana responsible for the "HIGH"). Studies suggest that CBD may interact with different biological target receptors in the body, including cannabinoid and other neurotransmitter receptors. As of 2018 the mechanism of action for its biological effects has not been determined.  Side-effects   Adverse reactions: Dry mouth, diarrhea, decreased appetite, fatigue, drowsiness, malaise, weakness, sleep disturbances, and others.  Drug interactions: CBC may interact with other medications such as blood-thinners. (Last update: 11/17/2020) ____________________________________________________________________________________________   ____________________________________________________________________________________________  Drug Holidays (Slow)  What is a "Drug Holiday"? Drug Holiday: is the name given to the period of time during which a patient stops taking a medication(s) for the purpose of eliminating tolerance to the drug.  Benefits Improved effectiveness of opioids. Decreased opioid dose needed to achieve benefits. Improved pain with lesser dose.  What is tolerance? Tolerance: is the progressive decreased in effectiveness of a drug due to its repetitive use. With repetitive use, the body gets use to the medication and as a consequence, it loses its effectiveness. This is a common problem seen with opioid pain medications. As a result, a larger dose of the drug is needed to achieve the same effect that used to be obtained with a smaller dose.  How long should a "Drug Holiday" last? You should stay off of the pain medicine for at least 14 consecutive days. (2 weeks)  Should I stop the medicine "cold [place]"? No. You should always coordinate with your Pain Specialist so that he/she can provide you with the correct medication dose to make the transition as smoothly as possible.  How do I stop the medicine? Slowly. You will be instructed to decrease the daily amount of pills that you take by one (1) pill every seven (7) days. This is called a "slow downward taper" of your dose. For example: if you normally take four (4) pills per day, you will be asked to drop this dose to three (3) pills per day for seven (7) days, then to two (2) pills per day for seven (7) days, then to one (1) per day for seven (7) days, and at the end of those last seven (7) days, this is when the "Drug Holiday" would start.   Will I have withdrawals? By doing a "slow downward taper" like this one, it is unlikely that you will experience any significant withdrawal symptoms. Typically, what triggers withdrawals is the sudden stop of a high dose  opioid therapy. Withdrawals can usually be avoided by slowly decreasing the dose over a prolonged period of time. If you do not follow these instructions and decide to stop your medication abruptly, withdrawals may be possible.  What are withdrawals? Withdrawals: refers  to the wide range of symptoms that occur after stopping or dramatically reducing opiate drugs after heavy and prolonged use. Withdrawal symptoms do not occur to patients that use low dose opioids, or those who take the medication sporadically. Contrary to benzodiazepine (example: Valium, Xanax, etc.) or alcohol withdrawals (Delirium Tremens), opioid withdrawals are not lethal. Withdrawals are the physical manifestation of the body getting rid of the excess receptors.  Expected Symptoms Early symptoms of withdrawal may include: Agitation Anxiety Muscle aches Increased tearing Insomnia Runny nose Sweating Yawning  Late symptoms of withdrawal may include: Abdominal cramping Diarrhea Dilated pupils Goose bumps Nausea Vomiting  Will I experience withdrawals? Due to the slow nature of the taper, it is very unlikely that you will experience any.  What is a slow taper? Taper: refers to the gradual decrease in dose.  (Last update: 09/08/2019) ____________________________________________________________________________________________

## 2021-04-02 ENCOUNTER — Encounter: Payer: Self-pay | Admitting: *Deleted

## 2021-04-03 ENCOUNTER — Ambulatory Visit: Payer: Medicare Other

## 2021-04-03 ENCOUNTER — Encounter: Payer: Self-pay | Admitting: *Deleted

## 2021-04-03 ENCOUNTER — Encounter
Admission: RE | Disposition: A | Discharge status: Home / Self Care | Payer: Self-pay | Source: Home / Self Care | Attending: Gastroenterology

## 2021-04-03 ENCOUNTER — Other Ambulatory Visit: Payer: Self-pay

## 2021-04-03 ENCOUNTER — Telehealth: Payer: Medicare Other | Admitting: Pain Medicine

## 2021-04-03 ENCOUNTER — Ambulatory Visit
Admission: RE | Admit: 2021-04-03 | Discharge: 2021-04-03 | Disposition: A | Discharge status: Home / Self Care | Payer: Medicare Other | Attending: Gastroenterology | Admitting: Gastroenterology

## 2021-04-03 DIAGNOSIS — Z7982 Long term (current) use of aspirin: Secondary | ICD-10-CM | POA: Diagnosis not present

## 2021-04-03 DIAGNOSIS — Z794 Long term (current) use of insulin: Secondary | ICD-10-CM | POA: Diagnosis not present

## 2021-04-03 DIAGNOSIS — K219 Gastro-esophageal reflux disease without esophagitis: Secondary | ICD-10-CM | POA: Insufficient documentation

## 2021-04-03 DIAGNOSIS — Z86010 Personal history of colonic polyps: Secondary | ICD-10-CM | POA: Insufficient documentation

## 2021-04-03 DIAGNOSIS — Z7984 Long term (current) use of oral hypoglycemic drugs: Secondary | ICD-10-CM | POA: Diagnosis not present

## 2021-04-03 DIAGNOSIS — I129 Hypertensive chronic kidney disease with stage 1 through stage 4 chronic kidney disease, or unspecified chronic kidney disease: Secondary | ICD-10-CM | POA: Insufficient documentation

## 2021-04-03 DIAGNOSIS — E119 Type 2 diabetes mellitus without complications: Secondary | ICD-10-CM | POA: Diagnosis not present

## 2021-04-03 DIAGNOSIS — G4730 Sleep apnea, unspecified: Secondary | ICD-10-CM | POA: Diagnosis not present

## 2021-04-03 DIAGNOSIS — N189 Chronic kidney disease, unspecified: Secondary | ICD-10-CM | POA: Diagnosis not present

## 2021-04-03 DIAGNOSIS — D123 Benign neoplasm of transverse colon: Secondary | ICD-10-CM | POA: Diagnosis not present

## 2021-04-03 DIAGNOSIS — D124 Benign neoplasm of descending colon: Secondary | ICD-10-CM | POA: Diagnosis not present

## 2021-04-03 DIAGNOSIS — K640 First degree hemorrhoids: Secondary | ICD-10-CM | POA: Insufficient documentation

## 2021-04-03 DIAGNOSIS — E1122 Type 2 diabetes mellitus with diabetic chronic kidney disease: Secondary | ICD-10-CM | POA: Diagnosis not present

## 2021-04-03 DIAGNOSIS — E7800 Pure hypercholesterolemia, unspecified: Secondary | ICD-10-CM | POA: Diagnosis not present

## 2021-04-03 DIAGNOSIS — Z79899 Other long term (current) drug therapy: Secondary | ICD-10-CM | POA: Insufficient documentation

## 2021-04-03 DIAGNOSIS — E1140 Type 2 diabetes mellitus with diabetic neuropathy, unspecified: Secondary | ICD-10-CM | POA: Diagnosis not present

## 2021-04-03 DIAGNOSIS — I2510 Atherosclerotic heart disease of native coronary artery without angina pectoris: Secondary | ICD-10-CM | POA: Insufficient documentation

## 2021-04-03 DIAGNOSIS — F419 Anxiety disorder, unspecified: Secondary | ICD-10-CM | POA: Insufficient documentation

## 2021-04-03 DIAGNOSIS — Z1211 Encounter for screening for malignant neoplasm of colon: Secondary | ICD-10-CM | POA: Insufficient documentation

## 2021-04-03 DIAGNOSIS — Z905 Acquired absence of kidney: Secondary | ICD-10-CM | POA: Diagnosis not present

## 2021-04-03 HISTORY — PX: COLONOSCOPY WITH PROPOFOL: SHX5780

## 2021-04-03 LAB — GLUCOSE, CAPILLARY: Glucose-Capillary: 175 mg/dL — ABNORMAL HIGH (ref 70–99)

## 2021-04-03 SURGERY — COLONOSCOPY WITH PROPOFOL
Anesthesia: General

## 2021-04-03 MED ORDER — PROPOFOL 10 MG/ML IV BOLUS
INTRAVENOUS | Status: DC | PRN
  Administered 2021-04-03: 50 mg via INTRAVENOUS

## 2021-04-03 MED ORDER — PHENYLEPHRINE HCL (PRESSORS) 10 MG/ML IV SOLN
INTRAVENOUS | Status: AC
  Filled 2021-04-03: qty 1

## 2021-04-03 MED ORDER — PROPOFOL 500 MG/50ML IV EMUL
INTRAVENOUS | Status: AC
  Filled 2021-04-03: qty 450

## 2021-04-03 MED ORDER — PHENYLEPHRINE HCL (PRESSORS) 10 MG/ML IV SOLN
INTRAVENOUS | Status: DC | PRN
Start: 2021-04-03 — End: 2021-04-03
  Administered 2021-04-03 (×3): 80 ug via INTRAVENOUS

## 2021-04-03 MED ORDER — LIDOCAINE HCL (PF) 2 % IJ SOLN
INTRAMUSCULAR | Status: AC
  Filled 2021-04-03: qty 5

## 2021-04-03 MED ORDER — LIDOCAINE HCL (PF) 2 % IJ SOLN
INTRAMUSCULAR | Status: DC | PRN
  Administered 2021-04-03: 50 mg via INTRADERMAL

## 2021-04-03 MED ORDER — LIDOCAINE HCL (PF) 2 % IJ SOLN
INTRAMUSCULAR | Status: AC
  Filled 2021-04-03: qty 20

## 2021-04-03 MED ORDER — PROPOFOL 500 MG/50ML IV EMUL
INTRAVENOUS | Status: DC | PRN
  Administered 2021-04-03: 200 ug/kg/min via INTRAVENOUS

## 2021-04-03 MED ORDER — SODIUM CHLORIDE 0.9 % IV SOLN: INTRAVENOUS | Status: DC

## 2021-04-03 NOTE — Anesthesia Preprocedure Evaluation (Addendum)
Anesthesia Evaluation  Patient identified by MRN, date of birth, ID band Patient awake    Reviewed: Allergy & Precautions, NPO status , Patient's Chart, lab work & pertinent test results, reviewed documented beta blocker date and time   Airway Mallampati: II  TM Distance: >3 FB Neck ROM: Full    Dental  (+) Teeth Intact, Dental Advisory Given   Pulmonary sleep apnea ,    breath sounds clear to auscultation       Cardiovascular Exercise Tolerance: Good hypertension, Pt. on home beta blockers and Pt. 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 Pt with chronic pain. Spinal cord stimulator was removed  Neuromuscular disease (diabetic neuropathy in feet)    GI/Hepatic GERD  Controlled and Medicated,  Endo/Other  diabetes, Well Controlled, Type 2, Insulin Dependent  Renal/GU CRFRenal disease (s/p nephrectomy 2022)     Musculoskeletal  (+) Arthritis ,   Abdominal Normal abdominal exam  (+)   Peds  Hematology   Anesthesia Other Findings   Reproductive/Obstetrics                           Anesthesia Physical  Anesthesia Plan  ASA: III  Anesthesia Plan: General   Post-op Pain Management:    Induction: Intravenous  PONV Risk Score and Plan: 1 and Propofol infusion and Treatment may vary due to age or medical condition  Airway Management Planned: Natural Airway and Simple Face Mask  Additional Equipment:   Intra-op Plan:   Post-operative Plan:   Informed Consent: I have reviewed the patients History and Physical, chart, labs and discussed the procedure including the risks, benefits and alternatives for the proposed anesthesia with the patient or authorized representative who has indicated his/her understanding  and acceptance.     Dental advisory given  Plan Discussed with: CRNA  Anesthesia Plan Comments: ( Stress Test 07/05/2019 FINDINGS:  Regional wall motion: reveals normal myocardial thickening and wall  motion.  The overall quality of the study is good.   Artifacts noted: no  Left ventricular cavity: normal. )       Anesthesia Quick Evaluation

## 2021-04-03 NOTE — Anesthesia Postprocedure Evaluation (Signed)
Anesthesia Post Note  Patient: Kenneth Osborn  Procedure(s) Performed: COLONOSCOPY WITH PROPOFOL  Patient location during evaluation: Endoscopy Anesthesia Type: General Level of consciousness: awake and alert Pain management: pain level controlled Vital Signs Assessment: post-procedure vital signs reviewed and stable Respiratory status: spontaneous breathing, nonlabored ventilation and respiratory function stable Cardiovascular status: blood pressure returned to baseline and stable Postop Assessment: no apparent nausea or vomiting Anesthetic complications: no   No notable events documented.   Last Vitals:  Vitals:   04/03/21 0830 04/03/21 0840  BP: 106/70 110/68  Pulse: 67 72  Resp: 17 16  Temp:    SpO2: 100% 99%    Last Pain:  Vitals:   04/03/21 0830  TempSrc:   PainSc: 0-No pain                 [place] Ouch

## 2021-04-03 NOTE — Transfer of Care (Signed)
Immediate Anesthesia Transfer of Care Note  Patient: Kenneth Osborn  Procedure(s) Performed: COLONOSCOPY WITH PROPOFOL  Patient Location: PACU  Anesthesia Type:General  Level of Consciousness: drowsy  Airway & Oxygen Therapy: Patient Spontanous Breathing and Patient connected to nasal cannula oxygen  Post-op Assessment: Report given to RN and Post -op Vital signs reviewed and stable  Post vital signs: Reviewed and stable  Last Vitals:  Vitals Value Taken Time  BP 92/58 04/03/21 0817  Temp 36.3 C 04/03/21 0816  Pulse 72 04/03/21 0818  Resp 35 04/03/21 0818  SpO2 97 % 04/03/21 0818  Vitals shown include unvalidated device data.  Last Pain:  Vitals:   04/03/21 0816  TempSrc: Temporal  PainSc: Asleep         Complications: No notable events documented.

## 2021-04-03 NOTE — Op Note (Signed)
[facility] Patient Name: Kenneth Osborn Procedure Date: 04/03/2021 7:14 AM MRN: [id number] Account #: [id number] Date of Birth: [birth year] Admit Type: Outpatient Age: 66 Room: [facility] ENDO ROOM 1 Gender: Male Note Status: Finalized Instrument Name: Park Meo [id number] Procedure:             Colonoscopy Indications:           Surveillance: Personal history of adenomatous polyps                         on last colonoscopy > 5 years ago Providers:             Andrey Farmer MD, MD Referring MD:          Irven Easterly. Kary Kos, MD (Referring MD) Medicines:             Monitored Anesthesia Care Complications:         No immediate complications. Estimated blood loss:                         Minimal. Procedure:             Pre-Anesthesia Assessment:                        - Prior to the procedure, a History and Physical was                         performed, and patient medications and allergies were                         reviewed. The patient is competent. The risks and                         benefits of the procedure and the sedation options and                         risks were discussed with the patient. All questions                         were answered and informed consent was obtained.                         Patient identification and proposed procedure were                         verified by the physician, the nurse, the                         anesthesiologist, the anesthetist and the technician                         in the endoscopy suite. Mental Status Examination:                         alert and oriented. Airway Examination: normal                         oropharyngeal airway and neck mobility. Respiratory  Examination: clear to auscultation. CV Examination:                         normal. Prophylactic Antibiotics: The patient does not                         require prophylactic antibiotics. Prior                          Anticoagulants: The patient has taken Effient                         (prasugrel), last dose was 5 days prior to procedure.                         ASA Grade Assessment: II - A patient with mild                         systemic disease. After reviewing the risks and                         benefits, the patient was deemed in satisfactory                         condition to undergo the procedure. The anesthesia                         plan was to use monitored anesthesia care (MAC).                         Immediately prior to administration of medications,                         the patient was re-assessed for adequacy to receive                         sedatives. The heart rate, respiratory rate, oxygen                         saturations, blood pressure, adequacy of pulmonary                         ventilation, and response to care were monitored                         throughout the procedure. The physical status of the                         patient was re-assessed after the procedure.                        After obtaining informed consent, the colonoscope was                         passed under direct vision. Throughout the procedure,                         the patient's blood pressure, pulse, and oxygen  saturations were monitored continuously. The                         Colonoscope was introduced through the anus and                         advanced to the the cecum, identified by appendiceal                         orifice and ileocecal valve. The colonoscopy was                         performed without difficulty. The patient tolerated                         the procedure well. The quality of the bowel                         preparation was fair. Findings:      The perianal and digital rectal examinations were normal.      A 1 mm polyp was found in the hepatic flexure. The polyp was sessile.       The polyp was removed with a jumbo cold  forceps. Resection and retrieval       were complete. Estimated blood loss was minimal.      A 2 mm polyp was found in the transverse colon. The polyp was sessile.       The polyp was removed with a cold snare. Resection and retrieval were       complete. Estimated blood loss was minimal.      A 2 mm polyp was found in the descending colon. The polyp was sessile.       The polyp was removed with a cold snare. Resection and retrieval were       complete. Estimated blood loss was minimal.      Internal hemorrhoids were found during retroflexion. The hemorrhoids       were Grade I (internal hemorrhoids that do not prolapse).      The exam was otherwise without abnormality on direct and retroflexion       views. Impression:            - Preparation of the colon was fair.                        - One 1 mm polyp at the hepatic flexure, removed with                         a jumbo cold forceps. Resected and retrieved.                        - One 2 mm polyp in the transverse colon, removed with                         a cold snare. Resected and retrieved.                        - One 2 mm polyp in the descending colon, removed with  a cold snare. Resected and retrieved.                        - Internal hemorrhoids.                        - The examination was otherwise normal on direct and                         retroflexion views. Recommendation:        - Discharge patient to home.                        - Resume previous diet.                        - Resume Effient (prasugrel) at prior dose tomorrow.                        - Await pathology results.                        - Repeat colonoscopy in 2 years because the bowel                         preparation was suboptimal.                        - Return to referring physician as previously                         scheduled. Procedure Code(s):     --- Professional ---                        [phone], Colonoscopy,  flexible; with removal of                         tumor(s), polyp(s), or other lesion(s) by snare                         technique                        45380, 85, Colonoscopy, flexible; with biopsy, single                         or multiple Diagnosis Code(s):     --- Professional ---                        Z86.010, Personal history of colonic polyps                        K64.0, First degree hemorrhoids                        K63.5, Polyp of colon CPT copyright 2019 American Medical Association. All rights reserved. The codes documented in this report are preliminary and upon coder review may  be revised to meet current compliance requirements. Andrey Farmer MD, MD 04/03/2021 8:18:42 AM Number of Addenda: 0 Note Initiated On: 04/03/2021 7:14 AM Scope Withdrawal Time: 0 hours 9 minutes 45  seconds  Total Procedure Duration: 0 hours 16 minutes 52 seconds  Estimated Blood Loss:  Estimated blood loss was minimal.      [facility]

## 2021-04-03 NOTE — Interval H&P Note (Signed)
History and Physical Interval Note:  04/03/2021 7:48 AM  Kenneth Osborn  has presented today for surgery, with the diagnosis of History of adenomatous colonic polyps  Z86.010.  The various methods of treatment have been discussed with the patient and family. After consideration of risks, benefits and other options for treatment, the patient has consented to  Procedure(s) with comments: COLONOSCOPY WITH PROPOFOL (N/A) - IDDM as a surgical intervention.  The patient's history has been reviewed, patient examined, no change in status, stable for surgery.  I have reviewed the patient's chart and labs.  Questions were answered to the patient's satisfaction.     Lesly Rubenstein  Ok to proceed with colonoscopy

## 2021-04-03 NOTE — H&P (Signed)
Outpatient short stay form Pre-procedure 04/03/2021  Lesly Rubenstein, MD  Primary Physician: [name], MD  Reason for visit:  Surveillance colonoscopy  History of present illness:    66 y/o gentleman with history of CAD, chronic back pain on narcotics, and DM II here for surveillance colonoscopy. Last colonoscopy was 6 years ago with small TA. History of cholecystectomy. Takes effient for CAD with last dose being 5 days ago.    Current Facility-Administered Medications:    0.9 %  sodium chloride infusion, , Intravenous, Continuous, Tamaiya Bump, Hilton Cork, MD, Last Rate: 20 mL/hr at 04/03/21 0731, Continued from Pre-op at 04/03/21 0731  Medications Prior to Admission  Medication Sig Dispense Refill Last Dose   ALPRAZolam (XANAX) 0.5 MG tablet Take 1 tablet (0.5 mg total) by mouth at bedtime. 30 tablet 0 Past Week   aspirin EC 81 MG tablet Take 81 mg by mouth at bedtime. Swallow whole.   04/02/2021   cetirizine (ZYRTEC) 10 MG tablet Take 10 mg by mouth daily.    04/03/2021 at 0600   cyclobenzaprine (FLEXERIL) 10 MG tablet Take 1 tablet (10 mg total) by mouth 3 (three) times daily as needed for muscle spasms. 30 tablet 0 04/03/2021 at 0600   diazepam (VALIUM) 10 MG tablet Take 1 tablet (10 mg total) by mouth 60 (sixty) minutes before procedure for 1 dose. Take with a sip of water, on an empty stomach. Do not eat anything for 6 hours prior to procedure. 1 tablet 0 04/03/2021 at 0600   diazepam (VALIUM) 5 MG tablet Take 1 tablet (5 mg total) by mouth See admin instructions. Take 5 mg in the morning and additional if needed for Meniere's disease up to three a day 30 tablet 0 04/03/2021 at 0600   diltiazem (CARDIZEM CD) 240 MG 24 hr capsule Take 240 mg by mouth daily.   04/03/2021 at 0600   enalapril (VASOTEC) 10 MG tablet Take 10 mg by mouth daily.   04/03/2021 at 0600   esomeprazole (NEXIUM) 40 MG capsule Take 40 mg by mouth 2 (two) times daily.   04/03/2021 at 0600   ezetimibe (ZETIA) 10 MG  tablet Take 10 mg by mouth daily.   04/03/2021 at 0600   gabapentin (NEURONTIN) 800 MG tablet Take 800 mg by mouth 2 (two) times daily.   04/03/2021 at 0600   Glucagon (BAQSIMI ONE PACK NA) Place 1 Dose into the nose daily as needed (severely low blood sugar).   04/03/2021 at 0600   HUMULIN R U-500 KWIKPEN 500 UNIT/ML kwikpen Inject 40 Units into the skin 3 (three) times daily with meals. (Patient taking differently: Inject 65-75 Units into the skin See admin instructions. Sliding scale  Can adjust according to blood sugar)  0 04/03/2021 at 0600   isosorbide mononitrate (IMDUR) 30 MG 24 hr tablet Take 30 mg by mouth daily.   04/03/2021 at 0600   magnesium oxide (MAG-OX) 400 MG tablet Take 500 mg by mouth daily.   Past Week   Magnesium Oxide 500 MG TABS Take 500 mg by mouth daily.   Past Week   meclizine (ANTIVERT) 25 MG tablet Take 25 mg by mouth See admin instructions. Take 25 mg in the morning, and additional 25 mg if needed up to a total of three per day for Meniere's disease   04/03/2021 at 0600   metFORMIN (GLUCOPHAGE) 1000 MG tablet Take 1,000 mg by mouth 2 (two) times daily.   04/03/2021 at 0600   metoprolol succinate (TOPROL-XL) 100  MG 24 hr tablet Take 100 mg by mouth 2 (two) times daily. Take with or immediately following a meal.   04/03/2021 at 0600   montelukast (SINGULAIR) 10 MG tablet Take 10 mg by mouth at bedtime.    04/03/2021   MOVANTIK 12.5 MG TABS tablet Take 12.5 mg by mouth daily.   04/03/2021 at 0600   niacin (NIASPAN) 500 MG CR tablet Take 500 mg by mouth at bedtime.   04/03/2021 at 0600   nitroGLYCERIN (NITROSTAT) 0.4 MG SL tablet Place 0.4 mg under the tongue as needed.   04/03/2021 at 0600   oxyCODONE (OXY IR/ROXICODONE) 5 MG immediate release tablet Take 1 tablet (5 mg total) by mouth every 4 (four) hours as needed for severe pain. Must last 30 days. 180 tablet 0 04/03/2021 at 0600   polyethylene glycol (MIRALAX / GLYCOLAX) 17 g packet Take 17 g by mouth 2 (two) times daily. 14 each 0  Past Week   rosuvastatin (CRESTOR) 40 MG tablet Take 40 mg by mouth at bedtime.   04/02/2021 at 1800   senna-docusate (SENOKOT-S) 8.6-50 MG tablet Take 1 tablet by mouth 2 (two) times daily. 30 tablet 0 Past Week   [START ON 04/19/2021] oxyCODONE (OXY IR/ROXICODONE) 5 MG immediate release tablet Take 1 tablet (5 mg total) by mouth every 4 (four) hours as needed for severe pain. Must last 30 days. 180 tablet 0    [START ON 05/19/2021] oxyCODONE (OXY IR/ROXICODONE) 5 MG immediate release tablet Take 1 tablet (5 mg total) by mouth every 4 (four) hours as needed for severe pain. Must last 30 days. 180 tablet 0    prasugrel (EFFIENT) 10 MG TABS tablet Take 10 mg by mouth daily.        No Known Allergies   Past Medical History:  Diagnosis Date   Acute postoperative pain 12/03/2018   Allergic rhinitis 12/30/2012   Anginal pain (HCC)    Anxiety    Bronchitis    hx of   Can't get food down 08/12/2014   Cancer ([place]) 01/2020   kidney    Chronic back pain    thoracic area   Concussion 09/2015   Coronary artery disease    99% blockage   DDD (degenerative disc disease), cervical    Dehydration symptoms    2019   Diabetes mellitus without complication (HCC)    insulin dependent   Dysphagia    GERD (gastroesophageal reflux disease)    History of Meniere's disease 12/21/2014   History of thoracic spine surgery (S/P T9-10 IVD spacer) 12/21/2014   Hypercholesteremia    Hyperlipidemia    Hypertension    sees Dr. 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 Drema [place], [facility]   Neuromuscular disorder [facility])    diabetic neuropathy in feet   Severe sepsis ([place]) 02/07/2020   Short-segment Barrett's esophagus    Sleep apnea    mild    Review of systems:  Otherwise negative.    Physical Exam  Gen: Alert, oriented. Appears stated age.  HEENT: PERRLA. Lungs: No respiratory distress CV: RRR Abd: soft, benign,  no masses Ext: No edema    Planned procedures: Proceed with colonoscopy. The patient understands the nature of the planned procedure, indications, risks, alternatives and potential complications including but not limited to bleeding, infection, perforation, damage to internal organs and possible oversedation/side effects from anesthesia. The patient agrees and  gives consent to proceed.  Please refer to procedure notes for findings, recommendations and patient disposition/instructions.     Lesly Rubenstein, MD [facility]

## 2021-04-04 ENCOUNTER — Encounter: Payer: Self-pay | Admitting: Gastroenterology

## 2021-04-04 LAB — SURGICAL PATHOLOGY

## 2021-04-10 ENCOUNTER — Telehealth: Payer: Medicare Other | Admitting: Pain Medicine

## 2021-06-13 ENCOUNTER — Encounter: Payer: Medicare Other | Admitting: Pain Medicine

## 2021-06-24 NOTE — Progress Notes (Signed)
PROVIDER NOTE: Information contained herein reflects review and annotations entered in association with encounter. Interpretation of such information and data should be left to medically-trained personnel. Information provided to patient can be located elsewhere in the medical record under "Patient Instructions". Document created using STT-dictation technology, any transcriptional errors that may result from process are unintentional.  ?  ?Patient: Kenneth Osborn  Service Category: E/M  Provider: Gaspar Cola, MD  ?DOB: December 06, 1955  DOS: 06/27/2021  Specialty: Interventional Pain Management  ?MRN: [id number]  Setting: Ambulatory outpatient  PCP: [name], MD  ?Type: Established Patient    Referring Provider: [name], MD  ?Location: Office  Delivery: Face-to-face    ? ?HPI  ?[name], a 66 y.o. year old male, is here today because of his Chronic pain syndrome [G89.4]. Kenneth Osborn primary complain today is Back Pain (Low and mid) ?Last encounter: My last encounter with him was on 03/19/2021. 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 at [facility] on 01/04/2013); Neurogenic pain; Thoracic facet syndrome (T8-10); Lumbar facet syndrome (Bilateral) (R>L); Cervical facet syndrome (Right); Cervical spondylosis; Lumbar spondylosis; Chronic upper extremity pain (Left); Chronic cervical radicular pain (Left); Chronic upper back pain; History of thoracic spine surgery (S/P T9-10 IVD spacer); Failed back surgical syndrome; Chronic musculoskeletal pain; Chronic lower extremity pain (Left); Chronic radicular lumbar pain (left L4 dermatomal pain); Arthralgia of shoulder; Chronic tension-type headache, not intractable; 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); Chronic pain syndrome;  Headache disorder; Trigger point posterior superior iliac spine (PSIS) (Right); 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 ?Pain Assessment: Severity of Chronic pain is reported as a 3 /10. Location: Back Mid, Lower/ . Onset: More than a month ago. Quality: Constant, Dull, Aching. Timing: Constant. Modifying factor(s): medications, rest, heat, ice, TENS, topicals. ?Vitals:  height is 5' 9"  (1.753 m) and weight is 210 lb (95.3 kg). His temporal temperature is 97.2 ?F (36.2 ?C) (abnormal). His blood pressure is 130/82 and his pulse is 125 (abnormal). His respiration is 18 and oxygen saturation is 98%.  ? ?Reason for encounter: medication management.  The patient indicates doing well with the current medication regimen. No adverse reactions or side effects reported to the medications.  ? ?UDS ordered today.  ? ?RTCB: 10/07/2021 ?Nonopioids transferred 11/22/2019: Magnesium, Flexeril, and Neurontin. ? ?Pharmacotherapy Assessment  ?Analgesic: Oxycodone IR 5 mg, 1 tab p.o. every 4 hours (30 mg/day of oxycodone) ?MME/day: 45 mg/day.  ? ?Monitoring: ?[place] PMP: PDMP reviewed during this encounter.       ?Pharmacotherapy: No side-effects or adverse reactions reported. ?Compliance: No problems identified. ?Effectiveness: Clinically acceptable. ? ?Hart Rochester, RN  06/27/2021  3:30 PM  Signed ?Nursing Pain Medication Assessment:  ?Safety precautions to be maintained throughout the outpatient stay will include: orient to surroundings, keep  bed in low position, maintain call bell within reach at all times, provide assistance with transfer out of bed and ambulation.  ?Medication Inspection Compliance: Pill count conducted under aseptic  conditions, in front of the patient. Neither the pills nor the bottle was removed from the patient's sight at any time. Once count was completed pills were immediately returned to the patient in their original bottle. ? ?Medication: Oxycodone IR ?Pill/Patch Count:  94 of 180 pills remain ?Pill/Patch Appearance: Markings consistent with prescribed medication ?Bottle Appearance: Standard pharmacy container. Clearly labeled. ?Filled Date: 04 / 22 / 2023 ?Last Medication intake:  Today ?   UDS:  ?Summary  ?Date Value Ref Range Status  ?06/19/2020 Note  Final  ?  Comment:  ?  ==================================================================== ?[employer] Select 13 (MW) ?==================================================================== ?Test                             Result       Flag       Units ? ?Drug Present and Declared for Prescription Verification ?  Desmethyldiazepam              101          EXPECTED   ng/mg creat ?  Oxazepam                       713          EXPECTED   ng/mg creat ?  Temazepam                      259          EXPECTED   ng/mg creat ?   Desmethyldiazepam, oxazepam, and temazepam are expected metabolites ?   of diazepam. Desmethyldiazepam and oxazepam are also expected ?   metabolites of other drugs, including chlordiazepoxide, prazepam, ?   clorazepate, and halazepam. Oxazepam is an expected metabolite of ?   temazepam. Oxazepam and temazepam are also available as scheduled ?   prescription medications. ? ?  Alprazolam                     32           EXPECTED   ng/mg creat ?  Alpha-hydroxyalprazolam        24           EXPECTED   ng/mg creat ?   Source of alprazolam is a scheduled prescription medication. Alpha- ?   hydroxyalprazolam is an expected metabolite of alprazolam. ? ?  Oxycodone                       1138         EXPECTED   ng/mg creat ?  Oxymorphone                    2681         EXPECTED   ng/mg creat ?  Noroxycodone                   637          EXPECTED   ng/mg creat ?  Noroxymorphone                 435          EXPECTED   ng/mg creat ?   Sources of oxycodone are scheduled prescription medications. ?   Oxymorphone, noroxycodone, and noroxymorphone are expected ?   metabolites of oxycodone. Oxymorphone is also available as a ?   scheduled prescription medication. ? ?==================================================================== ?Test  Result    Flag   Units      Ref Range ?  Creatinine              104              mg/dL      >=20 ?==================================================================== ?Declared Medications: ? The flagging and interpretation on this report are based on the ? following declared medications.  Unexpected results may arise from ? inaccuracies in the declared medications. ? ? **Note: The testing scope of this panel includes these medications: ? ? Alprazolam (Xanax) ? Diazepam (Valium) ? Oxycodone ? ? **Note: The testing scope of this panel does not include the ? following reported medications: ? ? Aspirin ? Cetirizine (Zyrtec) ? Cyclobenzaprine (Flexeril) ? Diltiazem (Cardizem) ? Esomeprazole (Nexium) ? Ezetimibe (Zetia) ? Gabapentin (Neurontin) ? Glucagon ? Insulin ? Magnesium (Mag-Ox) ? Meclizine (Antivert) ? Metoprolol (Toprol) ? Montelukast (Singulair) ? Niacin ? Polyethylene Glycol (MiraLAX) ? Prasugrel (Effient) ? Rosuvastatin (Crestor) ? Sennosides (Senokot) ?==================================================================== ?For clinical consultation, please call [phone]. ?==================================================================== ?  ?  ? ?ROS  ?Constitutional: Denies any fever or chills ?Gastrointestinal: No reported hemesis, hematochezia, vomiting, or acute GI distress ?Musculoskeletal: Denies any acute onset  joint swelling, redness, loss of ROM, or weakness ?Neurological: No reported episodes of acute onset apraxia, aphasia, dysarthria, agnosia, amnesia, paralysis, loss of coordination, or loss of consciousness

## 2021-06-27 ENCOUNTER — Ambulatory Visit: Payer: Medicare Other | Attending: Pain Medicine | Admitting: Pain Medicine

## 2021-06-27 ENCOUNTER — Other Ambulatory Visit: Payer: Self-pay

## 2021-06-27 ENCOUNTER — Encounter: Payer: Self-pay | Admitting: Pain Medicine

## 2021-06-27 ENCOUNTER — Other Ambulatory Visit: Payer: Self-pay | Admitting: Pain Medicine

## 2021-06-27 VITALS — BP 130/82 | HR 125 | Temp 97.2°F | Resp 18 | Ht 69.0 in | Wt 210.0 lb

## 2021-06-27 DIAGNOSIS — Z79899 Other long term (current) drug therapy: Secondary | ICD-10-CM | POA: Insufficient documentation

## 2021-06-27 DIAGNOSIS — Z79891 Long term (current) use of opiate analgesic: Secondary | ICD-10-CM | POA: Insufficient documentation

## 2021-06-27 DIAGNOSIS — G894 Chronic pain syndrome: Secondary | ICD-10-CM | POA: Insufficient documentation

## 2021-06-27 DIAGNOSIS — M7918 Myalgia, other site: Secondary | ICD-10-CM | POA: Insufficient documentation

## 2021-06-27 DIAGNOSIS — M545 Low back pain, unspecified: Secondary | ICD-10-CM | POA: Diagnosis present

## 2021-06-27 DIAGNOSIS — G8929 Other chronic pain: Secondary | ICD-10-CM | POA: Diagnosis present

## 2021-06-27 DIAGNOSIS — M961 Postlaminectomy syndrome, not elsewhere classified: Secondary | ICD-10-CM | POA: Diagnosis present

## 2021-06-27 DIAGNOSIS — M47894 Other spondylosis, thoracic region: Secondary | ICD-10-CM | POA: Diagnosis present

## 2021-06-27 DIAGNOSIS — M79605 Pain in left leg: Secondary | ICD-10-CM | POA: Insufficient documentation

## 2021-06-27 DIAGNOSIS — M79602 Pain in left arm: Secondary | ICD-10-CM | POA: Insufficient documentation

## 2021-06-27 DIAGNOSIS — M47812 Spondylosis without myelopathy or radiculopathy, cervical region: Secondary | ICD-10-CM | POA: Diagnosis present

## 2021-06-27 DIAGNOSIS — M549 Dorsalgia, unspecified: Secondary | ICD-10-CM | POA: Insufficient documentation

## 2021-06-27 MED ORDER — OXYCODONE HCL 5 MG PO TABS: 5.0000 mg | ORAL_TABLET | ORAL | 0 refills | Status: DC | PRN

## 2021-06-27 NOTE — Patient Instructions (Signed)
____________________________________________________________________________________________ ? ?Pharmacy Shortages of Pain Medication  ? ?Introduction ?Shockingly as it may seem, ? ? ??No [place] Supreme Court decision has ever interpreted the Constitution as guaranteeing a right to health care for all Americans.? ?- https://huff.com/ ? ??With respect to human rights, the [place] has no formally codified right to health, nor does it participate in a human rights treaty that specifies a right to health.? ?- Scott J. Schweikart, JD, MBE ? ?Situation ?By now, most of our patients have had the experience of being told by their pharmacist that they do not have enough medication to cover their prescription. If you have not had this experience, just know that you soon will. ? ?Problem ?There appears to be a shortage of these medications, either at the national level or locally. This is happening with all pharmacies. When there is not enough medication, patients are offered a partial fill and they are told that they will try to get the rest of the medicine for them at a later time. If they do not have enough for even a partial fill, the pharmacists are telling the patients to call us (the prescribing physicians) to request that we send another prescription to another pharmacy to get the medicine.  ? ?This reordering of a controlled substance creates documentation problems where additional paperwork needs to be created to explain why two prescriptions for the same period of time and the same medicine are being prescribed to the same patient. It also creates situations where the last appointment note does not accurately reflect when and what prescriptions were given to a patient. This leads to prescribing errors down the line, in subsequent follow-up visits.  ? ?[place] ?Research revealed that Board of Pharmacy Rule .1806 (21  [place]AC 46.1806) authorizes pharmacists to the transfer of prescriptions among pharmacies, and it sets forth procedural and recordkeeping requirements for doing so. However, this requires the pharmacist to complete the previously mentioned procedural paperwork to accomplish the transfer. As it turns out, it is much easier for them to have the prescribing physicians do the work.  ? ?Possible solutions ?1. Have the [facility] Assembly add a provision to the "STOP ACT" (the law that mandates how controlled substances are prescribed) where there is an exception to the electronic prescribing rule that states that in the event there are shortages of medications the physicians are allowed to use written prescriptions as opposed to electronic ones. This would allow patients to take their prescriptions to a different pharmacy that may have enough medication available to fill the prescription. The problem is that currently there is a law that does not allow for written prescriptions, with the exception of instances where the electronic medical record is down due to technical issues.  ?2. Have [name] Congress ease the pressure on pharmaceutical companies, allowing them to produce enough quantities of the medication to adequately supply the population. ?3. Have pharmacies keep enough stocks of these medications to cover their client base.  ?4. Have the [facility] Assembly add a provision to the "STOP ACT" where they ease the regulations surrounding the transfer of controlled substances between pharmacies, so as to simplify the transfer of supplies. As an alternative, develop a system to allow patients to obtain the remainder of their prescription at another one of their pharmacies or at an associate pharmacy.  ? ?How this shortage will affect you.  ?The one thing that is abundantly clear is that this is a pharmacy supply  problem  and not a prescriber problem. The job of the prescriber is to evaluate and monitor the  patients for the appropriate indications to the use of these medicines, the monitoring of their use and the prescribing of the appropriate dose and regimen. It is not the job of the prescriber to provide or dispense the actual medication. By law, this is the job of the pharmacies and pharmacists. It is certainly not the job of the prescriber to solve the supply problems.  ? ?Due to the above problems we are no longer taking patients to write for their pain medication. We will continue to evaluate for appropriate indications and we may provide recommendations regarding medication, dose, and schedule, as well as monitoring recommendations, however, we will not be taking over the actual prescribing of these substances. On those patients where we are treating their chronic pain with interventional therapies, exceptions will be considered on a case by case basis. At this time, we will try to continue providing this supplemental service to those patients we have been managing in the past. However, as of August 1st, 2023, we no longer will be sending additional prescriptions to other pharmacies for the purpose of solving their supply problems. Once we send a prescription to a pharmacy, we will not be resending it again to another pharmacy to cover for their shortages.  ? ?What to do. ?Write as many letters as you can. Recruit the help of family members in writing these letters. Below are some of the places where you can write to make your voice heard. Let them know what the problem is and push them to look for solutions.  ? ?Search internet for: ?[place] find your legislators? ?NoseSwap.is ? ?Search internet for: ?[employer] commissioner complaints? ?Starlas.fi ? ?Search internet for: ?[employer] of Pharmacy complaints? ?Twistzilla.es ? ?Search internet for: ?[facility] pharmacy  complaints? ?Email [facility] Customer Relations ?[email or web address]?[email or web address]=store ? ?Search internet for: ?Holiday representative customer service complaints? ?[email or web address]://[email or web address]/[email or web address] ? ?____________________________________________________________________________________________ ? ____________________________________________________________________________________________ ? ?Medication Rules ? ?Purpose: To inform patients, and their family members, of our rules and regulations. ? ?Applies to: All patients receiving prescriptions (written or electronic). ? ?Pharmacy of record: Pharmacy where electronic prescriptions will be sent. If written prescriptions are taken to a different pharmacy, please inform the nursing staff. The pharmacy listed in the electronic medical record should be the one where you would like electronic prescriptions to be sent. ? ?Electronic prescriptions: In compliance with the [place] (STOP) Act of 2017 (Session Lanny Cramp [phone]), effective February 18, 2018, all controlled substances must be electronically prescribed. Calling prescriptions to the pharmacy will cease to exist. ? ?Prescription refills: Only during scheduled appointments. Applies to all prescriptions. ? ?NOTE: The following applies primarily to controlled substances (Opioid* Pain Medications).  ? ?Type of encounter (visit): For patients receiving controlled substances, face-to-face visits are required. (Not an option or up to the patient.) ? ?Patient's responsibilities: ?Pain Pills: Bring all pain pills to every appointment (except for procedure appointments). ?Pill Bottles: Bring pills in original pharmacy bottle. Always bring the newest bottle. Bring bottle, even if empty. ?Medication refills: You are responsible for knowing and keeping track of what medications you take and those you need  refilled. ?The day before your appointment: write a list of all prescriptions that need to be refilled. ?The day of the appointment: give the list to the admitting nurse. Prescriptions will be written only during appoint

## 2021-06-27 NOTE — Progress Notes (Signed)
Nursing Pain Medication Assessment:  ?Safety precautions to be maintained throughout the outpatient stay will include: orient to surroundings, keep bed in low position, maintain call bell within reach at all times, provide assistance with transfer out of bed and ambulation.  ?Medication Inspection Compliance: Pill count conducted under aseptic conditions, in front of the patient. Neither the pills nor the bottle was removed from the patient's sight at any time. Once count was completed pills were immediately returned to the patient in their original bottle. ? ?Medication: Oxycodone IR ?Pill/Patch Count:  94 of 180 pills remain ?Pill/Patch Appearance: Markings consistent with prescribed medication ?Bottle Appearance: Standard pharmacy container. Clearly labeled. ?Filled Date: 04 / 22 / 2023 ?Last Medication intake:  Today ?

## 2021-07-03 LAB — TOXASSURE SELECT 13 (MW), URINE

## 2021-09-30 NOTE — Patient Instructions (Signed)

## 2021-09-30 NOTE — Progress Notes (Signed)
PROVIDER NOTE: Information contained herein reflects review and annotations entered in association with encounter. Interpretation of such information and data should be left to medically-trained personnel. Information provided to patient can be located elsewhere in the medical record under "Patient Instructions". Document created using STT-dictation technology, any transcriptional errors that may result from process are unintentional.    Patient: Kenneth Osborn  Service Category: E/M  Provider: Gaspar Cola, MD  DOB: 03/30/1955  DOS: 10/01/2021  Referring Provider: [name], MD  MRN: [id number]  Specialty: Interventional Pain Management  PCP: [name], MD  Type: Established Patient  Setting: Ambulatory outpatient    Location: Office  Delivery: Face-to-face     HPI  [name], a 66 y.o. year old male, is here today because of his Chronic pain syndrome [G89.4]. Mr. Demartini primary complain today is Back Pain Last encounter: My last encounter with him was on 06/27/2021. Pertinent problems: Mr. 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 Beverely Pace at [facility] on 01/04/2013); Neurogenic pain; Thoracic facet syndrome (T8-10); Lumbar facet syndrome (Bilateral) (R>L); Cervical facet syndrome (Right); Cervical spondylosis; Lumbar spondylosis; Chronic upper extremity pain (Left); Chronic cervical radicular pain (Left); Chronic upper back pain; History of thoracic spine surgery (S/P T9-10 IVD spacer); Failed back surgical syndrome; Chronic musculoskeletal pain; Chronic lower extremity pain (Left); Chronic radicular lumbar pain (left L4 dermatomal pain); Arthralgia of shoulder; Chronic tension-type headache, not intractable; 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); Chronic pain syndrome; Headache disorder;  Trigger point posterior superior iliac spine (PSIS) (Right); 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 Pain Assessment: Severity of   is reported as a 4 /10. Location: Back Lower, Mid/hips bilateral down back of leg to feet. Onset: More than a month ago. Quality: Aching, Constant, Sharp, Stabbing. Timing: Constant. Modifying factor(s): medications. rest, heat, ice, massage. Vitals:  height is _0  (1.753 m) and weight is 224 lb (101.6 kg). His temperature is 97.3 F (36.3 C) (abnormal). His blood pressure is 138/78 and his pulse is 82. His respiration is 16 and oxygen saturation is 99%.   Reason for encounter: medication management.  The patient indicates doing well with the current medication regimen. No adverse reactions or side effects reported to the medications.   RTCB: 01/05/2022 Nonopioids transferred 11/22/2019: Magnesium, Flexeril, and Neurontin.  Pharmacotherapy Assessment  Analgesic: Oxycodone IR 5 mg, 1 tab p.o. every 4 hours (30 mg/day of oxycodone) MME/day: 45 mg/day.   Monitoring: [place] PMP: PDMP reviewed during this encounter.       Pharmacotherapy: No side-effects or adverse reactions reported. Compliance: No problems identified. Effectiveness: Clinically acceptable.  Ignatius Specking, RN  10/01/2021 11:44 AM  Sign when Signing Visit Nursing Pain Medication Assessment:  Safety precautions to be maintained throughout the outpatient stay will include: orient to surroundings, keep bed in low  position, maintain call bell within reach at all times, provide assistance with transfer out of bed and ambulation.  Medication Inspection Compliance: Pill count conducted under aseptic conditions, in  front of the patient. Neither the pills nor the bottle was removed from the patient's sight at any time. Once count was completed pills were immediately returned to the patient in their original bottle.  Medication: See above Pill/Patch Count:  111 of 180 pills remain Pill/Patch Appearance: Markings consistent with prescribed medication Bottle Appearance: Standard pharmacy container. Clearly labeled. Filled Date: 07 / 27 / 2023 Last Medication intake:  Today    UDS:  Summary  Date Value Ref Range Status  06/27/2021 Note  Final    Comment:    ==================================================================== [employer] Select 13 (MW) ==================================================================== Test                             Result       Flag       Units  Drug Present and Declared for Prescription Verification   Desmethyldiazepam              222          EXPECTED   ng/mg creat   Oxazepam                       665          EXPECTED   ng/mg creat   Temazepam                      411          EXPECTED   ng/mg creat    Desmethyldiazepam, oxazepam, and temazepam are expected metabolites    of diazepam. Desmethyldiazepam and oxazepam are also expected    metabolites of other drugs, including chlordiazepoxide, prazepam,    clorazepate, and halazepam. Oxazepam is an expected metabolite of    temazepam. Oxazepam and temazepam are also available as scheduled    prescription medications.    Oxycodone                      371          EXPECTED   ng/mg creat   Oxymorphone                    1345         EXPECTED   ng/mg creat   Noroxycodone                   511          EXPECTED   ng/mg creat   Noroxymorphone                 320          EXPECTED   ng/mg creat    Sources of oxycodone are  scheduled prescription medications.    Oxymorphone, noroxycodone, and noroxymorphone are expected    metabolites of oxycodone. Oxymorphone is also available as a    scheduled prescription medication.  Drug Absent but Declared for Prescription Verification   Alprazolam                     Not Detected UNEXPECTED ng/mg creat ==================================================================== Test                      Result    Flag   Units      Ref Range   Creatinine              65  mg/dL      >=20 ==================================================================== Declared Medications:  The flagging and interpretation on this report are based on the  following declared medications.  Unexpected results may arise from  inaccuracies in the declared medications.   **Note: The testing scope of this panel includes these medications:   Alprazolam (Xanax)  Diazepam (Valium)  Oxycodone (Roxicodone)   **Note: The testing scope of this panel does not include the  following reported medications:   Aspirin  Cetirizine (Zyrtec)  Cyclobenzaprine (Flexeril)  Diltiazem (Cardizem)  Docusate (Senokot-S)  Enalapril (Vasotec)  Esomeprazole (Nexium)  Ezetimibe (Zetia)  Gabapentin (Neurontin)  Glucagon  Insulin (Humulin)  Isosorbide (Imdur)  Magnesium (Mag-Ox)  Meclizine (Antivert)  Metformin (Glucophage)  Metoprolol (Toprol)  Montelukast (Singulair)  Naloxegol (Movantik)  Niacin  Nitroglycerin (Nitrostat)  Polyethylene Glycol (MiraLAX)  Prasugrel (Effient)  Rosuvastatin (Crestor)  Sennosides (Senokot-S) ==================================================================== For clinical consultation, please call [phone]. ====================================================================      ROS  Constitutional: Denies any fever or chills Gastrointestinal: No reported hemesis, hematochezia, vomiting, or acute GI distress Musculoskeletal: Denies any acute onset  joint swelling, redness, loss of ROM, or weakness Neurological: No reported episodes of acute onset apraxia, aphasia, dysarthria, agnosia, amnesia, paralysis, loss of coordination, or loss of consciousness  Medication Review  ALPRAZolam, Glucagon, Magnesium Oxide -Mg Supplement, aspirin EC, cetirizine, cyclobenzaprine, diazepam, diltiazem, enalapril, esomeprazole, ezetimibe, gabapentin, insulin regular human CONCENTRATED, isosorbide mononitrate, magnesium oxide, meclizine, metFORMIN, metoprolol succinate, montelukast, naloxegol oxalate, niacin, nitroGLYCERIN, oxyCODONE, polyethylene glycol, prasugrel, rosuvastatin, and senna-docusate  History Review  Allergy: Mr. Barren has No Known Allergies. Drug: Mr. Mckamie  reports no history of drug use. Alcohol:  reports no history of alcohol use. Tobacco:  reports that he has never smoked. He has never used smokeless tobacco. Social: Mr. Lienhard  reports that he has never smoked. He has never used smokeless tobacco. He reports that he does not drink alcohol and does not use drugs. Medical:  has a past medical history of Acute postoperative pain (12/03/2018), Allergic rhinitis (12/30/2012), Anginal pain ([place]), Anxiety, Bronchitis, Can't get food down (08/12/2014), Cancer ([place]) (01/2020), Chronic back pain, Concussion (09/2015), Coronary artery disease, DDD (degenerative disc disease), cervical, Dehydration symptoms, Diabetes mellitus without complication ([place]), Dysphagia, GERD (gastroesophageal reflux disease), History of Meniere's disease (12/21/2014), History of thoracic spine surgery (S/P T9-10 IVD spacer) (12/21/2014), Hypercholesteremia, Hyperlipidemia, Hypertension, Meniere's disease, Myocardial infarction ([place]) (2012), Neuromuscular disorder ([place]), Severe sepsis ([place]) (02/07/2020), Short-segment Barrett's esophagus, and Sleep apnea. Surgical: Mr. Christopoulos  has a past surgical history that includes Cardiovascular stress test; Cardiac catheterization; Labrinthectomy;  mastoid shunt (Bilateral); Back surgery; Shoulder arthroscopy with subacromial decompression (Left, 04/06/2012); ARTHRODESIS ANTERIOR ANTERIOR CERVICLE SPINE (01/04/2013); Colonoscopy with propofol (N/A, 09/19/2014); Esophagogastroduodenoscopy (N/A, 09/19/2014); Savory dilation (N/A, 09/19/2014); Spinal cord stimulator implant (Right); Cardiac catheterization (N/A, 06/15/2015); Cardiac catheterization (N/A, 06/15/2015); Pulse generator implant (N/A, 01/18/2019); Lumbar spinal cord simulator lead removal (Right, 08/09/2019); IR Perc Cholecystostomy (02/09/2020); IR Radiologist Eval & Mgmt (03/14/2020); Coronary angioplasty; Appendectomy; Robot assisted laparoscopic nephrectomy (Left, 04/20/2020); and Colonoscopy with propofol (N/A, 04/03/2021). Family: family history includes Cancer in his sister; Diabetes in his maternal grandmother, mother, and paternal grandmother; Heart disease in his father, maternal aunt, maternal uncle, and mother.  Laboratory Chemistry Profile   Renal Lab Results  Component Value Date   BUN 21 12/11/2020   CREATININE 1.49 (H) 12/11/2020   GFRAA 58 (L) 01/08/2019   GFRNONAA 51 (L) 12/11/2020    Hepatic Lab Results  Component Value Date   AST 18 12/11/2020  ALT 17 12/11/2020   ALBUMIN 4.6 12/11/2020   ALKPHOS 92 12/11/2020   HCVAB NON REACTIVE 02/08/2020   LIPASE 28 12/11/2020   AMMONIA 24 02/07/2020    Electrolytes Lab Results  Component Value Date   NA 138 12/11/2020   K 4.7 12/11/2020   CL 104 12/11/2020   CALCIUM 10.6 (H) 12/11/2020   MG 1.6 (L) 12/11/2020   PHOS 3.1 11/09/2020    Bone No results found for: "VD25OH", "VD125OH2TOT", "MV7846NG2", "XB2841LK4", "25OHVITD1", "25OHVITD2", "40NUUVOZ3", "TESTOFREE", "TESTOSTERONE"  Inflammation (CRP: Acute Phase) (ESR: Chronic Phase) Lab Results  Component Value Date   CRP 1.4 (H) 03/22/2015   ESRSEDRATE 9 03/22/2015   LATICACIDVEN 1.5 12/11/2020         Note: Above Lab results reviewed.  Recent Imaging Review  DG  PAIN CLINIC C-ARM 1-60 MIN NO REPORT Fluoro was used, but no Radiologist interpretation will be provided.  Please refer to "NOTES" tab for provider progress note. Note: Reviewed        Physical Exam  General appearance: Well nourished, well developed, and well hydrated. In no apparent acute distress Mental status: Alert, oriented x 3 (person, place, & time)       Respiratory: No evidence of acute respiratory distress Eyes: PERLA Vitals: BP 138/78   Pulse 82   Temp (!) 97.3 F (36.3 C)   Resp 16   Ht _0  (1.753 m)   Wt 224 lb (101.6 kg)   SpO2 99%   BMI 33.08 kg/m  BMI: Estimated body mass index is 33.08 kg/m as calculated from the following:   Height as of this encounter: _1  (1.753 m).   Weight as of this encounter: 224 lb (101.6 kg). Ideal: Ideal body weight: 70.7 kg (155 lb 13.8 oz) Adjusted ideal body weight: 83.1 kg (183 lb 1.9 oz)  Assessment   Diagnosis Status  1. Chronic pain syndrome   2. Failed back surgical syndrome   3. 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 Beverely Pace at [facility] on 01/04/2013)   4. Chronic lower extremity pain (Left)   5. Cervical facet syndrome (Right)   6. Chronic low back pain (Bilateral) w/o sciatica   7. Chronic musculoskeletal pain   8. Chronic upper back pain   9. Chronic upper extremity pain (Left)   10. Thoracic facet syndrome (T8-10)   11. Pharmacologic therapy   12. Chronic use of opiate for therapeutic purpose   13. Encounter for medication management   14. Encounter for chronic pain management    Controlled Controlled Controlled   Updated Problems: No problems updated.  Plan of Care  Problem-specific:  No problem-specific Assessment & Plan notes found for this encounter.  Mr. JAIMEN MELONE has a current medication list which includes the following long-term medication(s): cetirizine, cyclobenzaprine, esomeprazole, ezetimibe, gabapentin, humulin r u-500 kwikpen, magnesium oxide -mg supplement,  metoprolol succinate, montelukast, niacin, [START ON 10/07/2021] oxycodone, [START ON 11/06/2021] oxycodone, and [START ON 12/06/2021] oxycodone.  Pharmacotherapy (Medications Ordered): Meds ordered this encounter  Medications   oxyCODONE (OXY IR/ROXICODONE) 5 MG immediate release tablet    Sig: Take 1 tablet (5 mg total) by mouth every 4 (four) hours as needed for severe pain. Must last 30 days.    Dispense:  180 tablet    Refill:  0    DO NOT: delete (not duplicate); no partial-fill (will deny script to complete), no refill request (F/U required). DISPENSE: 1 day early if closed on fill date. WARN: No CNS-depressants within  8 hrs of med.   oxyCODONE (OXY IR/ROXICODONE) 5 MG immediate release tablet    Sig: Take 1 tablet (5 mg total) by mouth every 4 (four) hours as needed for severe pain. Must last 30 days.    Dispense:  180 tablet    Refill:  0    DO NOT: delete (not duplicate); no partial-fill (will deny script to complete), no refill request (F/U required). DISPENSE: 1 day early if closed on fill date. WARN: No CNS-depressants within 8 hrs of med.   oxyCODONE (OXY IR/ROXICODONE) 5 MG immediate release tablet    Sig: Take 1 tablet (5 mg total) by mouth every 4 (four) hours as needed for severe pain. Must last 30 days.    Dispense:  180 tablet    Refill:  0    DO NOT: delete (not duplicate); no partial-fill (will deny script to complete), no refill request (F/U required). DISPENSE: 1 day early if closed on fill date. WARN: No CNS-depressants within 8 hrs of med.   Orders:  No orders of the defined types were placed in this encounter.  Follow-up plan:   Return in about 3 months (around 01/05/2022) for Eval-day (M,W), (F2F), (MM).     Interventional Therapies  Risk  Complexity Considerations:   Estimated body mass index is 28.94 kg/m as calculated from the following:   Height as of this encounter: _0  (1.753 m).   Weight as of this encounter: 196 lb (88.9 kg). NOTE: EFFIENT  Anticoagulation (Stop: 7-10 days  Re-start: 6 hrs)   Planned  Pending:   Palliative left lumbar facet RFA #3 (02/20/2021)    Under consideration:   CYP2D6 / CYP3A4 genetic testing.   Completed:   Palliative right PSIS MNB/TPI x3 (11/13/2017) (100/100/100)  Diagnostic/palliative right lumbar facet MBB x2 (09/22/2018) (100/100/75/75)  Diagnostic/palliative left lumbar facet MBB x2 (09/22/2018) (100/100/75/75)  Palliative left lumbar facet RFA x3 (02/20/2021) (85/85/55/60-70)  Palliative right lumbar facet RFA x3 (01/09/2021) (100/95/50/60-70)    Therapeutic  Palliative (PRN) options:   Palliative right PSIS MNB/TPI  Diagnostic/palliative lumbar facet MBB  Palliative lumbar facet RFA      Recent Visits No visits were found meeting these conditions. Showing recent visits within past 90 days and meeting all other requirements Today's Visits Date Type Provider Dept  10/01/21 Office Visit Milinda Pointer, MD [facility]  Showing today's visits and meeting all other requirements Future Appointments No visits were found meeting these conditions. Showing future appointments within next 90 days and meeting all other requirements  I discussed the assessment and treatment plan with the patient. The patient was provided an opportunity to ask questions and all were answered. The patient agreed with the plan and demonstrated an understanding of the instructions.  Patient advised to call back or seek an in-person evaluation if the symptoms or condition worsens.  Duration of encounter: 30 minutes.  Total time on encounter, as per AMA guidelines included both the face-to-face and non-face-to-face time personally spent by the physician and/or other qualified health care professional(s) on the day of the encounter (includes time in activities that require the physician or other qualified health care professional and does not include time in activities normally performed by clinical staff).  Physician's time may include the following activities when performed: preparing to see the patient (eg, review of tests, pre-charting review of records) obtaining and/or reviewing separately obtained history performing a medically appropriate examination and/or evaluation counseling and educating the patient/family/caregiver ordering medications, tests, or procedures referring  and communicating with other health care professionals (when not separately reported) documenting clinical information in the electronic or other health record independently interpreting results (not separately reported) and communicating results to the patient/ family/caregiver care coordination (not separately reported)  Note by: Gaspar Cola, MD Date: 10/01/2021; Time: 11:52 AM

## 2021-10-01 ENCOUNTER — Ambulatory Visit: Payer: Medicare Other | Attending: Pain Medicine | Admitting: Pain Medicine

## 2021-10-01 ENCOUNTER — Encounter: Payer: Self-pay | Admitting: Pain Medicine

## 2021-10-01 VITALS — BP 138/78 | HR 82 | Temp 97.3°F | Resp 16 | Ht 69.0 in | Wt 224.0 lb

## 2021-10-01 DIAGNOSIS — Z79899 Other long term (current) drug therapy: Secondary | ICD-10-CM | POA: Insufficient documentation

## 2021-10-01 DIAGNOSIS — M549 Dorsalgia, unspecified: Secondary | ICD-10-CM | POA: Insufficient documentation

## 2021-10-01 DIAGNOSIS — M79605 Pain in left leg: Secondary | ICD-10-CM | POA: Insufficient documentation

## 2021-10-01 DIAGNOSIS — M7918 Myalgia, other site: Secondary | ICD-10-CM | POA: Diagnosis present

## 2021-10-01 DIAGNOSIS — M961 Postlaminectomy syndrome, not elsewhere classified: Secondary | ICD-10-CM | POA: Insufficient documentation

## 2021-10-01 DIAGNOSIS — G894 Chronic pain syndrome: Secondary | ICD-10-CM | POA: Diagnosis present

## 2021-10-01 DIAGNOSIS — M47894 Other spondylosis, thoracic region: Secondary | ICD-10-CM | POA: Insufficient documentation

## 2021-10-01 DIAGNOSIS — G8929 Other chronic pain: Secondary | ICD-10-CM | POA: Diagnosis present

## 2021-10-01 DIAGNOSIS — Z79891 Long term (current) use of opiate analgesic: Secondary | ICD-10-CM | POA: Diagnosis present

## 2021-10-01 DIAGNOSIS — M545 Low back pain, unspecified: Secondary | ICD-10-CM | POA: Insufficient documentation

## 2021-10-01 DIAGNOSIS — M79602 Pain in left arm: Secondary | ICD-10-CM | POA: Insufficient documentation

## 2021-10-01 DIAGNOSIS — M47812 Spondylosis without myelopathy or radiculopathy, cervical region: Secondary | ICD-10-CM | POA: Diagnosis present

## 2021-10-01 MED ORDER — OXYCODONE HCL 5 MG PO TABS: 5.0000 mg | ORAL_TABLET | ORAL | 0 refills | Status: DC | PRN

## 2021-10-01 NOTE — Progress Notes (Signed)
Nursing Pain Medication Assessment:  Safety precautions to be maintained throughout the outpatient stay will include: orient to surroundings, keep bed in low position, maintain call bell within reach at all times, provide assistance with transfer out of bed and ambulation.  Medication Inspection Compliance: Pill count conducted under aseptic conditions, in front of the patient. Neither the pills nor the bottle was removed from the patient's sight at any time. Once count was completed pills were immediately returned to the patient in their original bottle.  Medication: See above Pill/Patch Count:  111 of 180 pills remain Pill/Patch Appearance: Markings consistent with prescribed medication Bottle Appearance: Standard pharmacy container. Clearly labeled. Filled Date: 07 / 27 / 2023 Last Medication intake:  Today

## 2021-11-01 ENCOUNTER — Observation Stay
Admission: EM | Admit: 2021-11-01 | Discharge: 2021-11-02 | Disposition: A | Discharge status: Home / Self Care | Payer: Medicare Other | Attending: Hospitalist | Admitting: Hospitalist

## 2021-11-01 ENCOUNTER — Other Ambulatory Visit: Payer: Self-pay

## 2021-11-01 DIAGNOSIS — E875 Hyperkalemia: Principal | ICD-10-CM | POA: Diagnosis present

## 2021-11-01 DIAGNOSIS — Z7982 Long term (current) use of aspirin: Secondary | ICD-10-CM | POA: Insufficient documentation

## 2021-11-01 DIAGNOSIS — Z9861 Coronary angioplasty status: Secondary | ICD-10-CM | POA: Insufficient documentation

## 2021-11-01 DIAGNOSIS — Z79891 Long term (current) use of opiate analgesic: Secondary | ICD-10-CM

## 2021-11-01 DIAGNOSIS — N179 Acute kidney failure, unspecified: Secondary | ICD-10-CM | POA: Diagnosis not present

## 2021-11-01 DIAGNOSIS — N189 Chronic kidney disease, unspecified: Secondary | ICD-10-CM | POA: Diagnosis present

## 2021-11-01 DIAGNOSIS — D631 Anemia in chronic kidney disease: Secondary | ICD-10-CM | POA: Diagnosis present

## 2021-11-01 DIAGNOSIS — Z794 Long term (current) use of insulin: Secondary | ICD-10-CM | POA: Diagnosis not present

## 2021-11-01 DIAGNOSIS — I129 Hypertensive chronic kidney disease with stage 1 through stage 4 chronic kidney disease, or unspecified chronic kidney disease: Secondary | ICD-10-CM | POA: Diagnosis not present

## 2021-11-01 DIAGNOSIS — I2510 Atherosclerotic heart disease of native coronary artery without angina pectoris: Secondary | ICD-10-CM | POA: Diagnosis not present

## 2021-11-01 DIAGNOSIS — Z79899 Other long term (current) drug therapy: Secondary | ICD-10-CM | POA: Insufficient documentation

## 2021-11-01 DIAGNOSIS — E1122 Type 2 diabetes mellitus with diabetic chronic kidney disease: Secondary | ICD-10-CM | POA: Insufficient documentation

## 2021-11-01 DIAGNOSIS — Z952 Presence of prosthetic heart valve: Secondary | ICD-10-CM | POA: Insufficient documentation

## 2021-11-01 DIAGNOSIS — G8929 Other chronic pain: Secondary | ICD-10-CM | POA: Diagnosis present

## 2021-11-01 DIAGNOSIS — M7918 Myalgia, other site: Secondary | ICD-10-CM | POA: Diagnosis present

## 2021-11-01 DIAGNOSIS — Z85528 Personal history of other malignant neoplasm of kidney: Secondary | ICD-10-CM | POA: Insufficient documentation

## 2021-11-01 DIAGNOSIS — E1142 Type 2 diabetes mellitus with diabetic polyneuropathy: Secondary | ICD-10-CM | POA: Diagnosis not present

## 2021-11-01 DIAGNOSIS — N1831 Chronic kidney disease, stage 3a: Secondary | ICD-10-CM

## 2021-11-01 DIAGNOSIS — I10 Essential (primary) hypertension: Secondary | ICD-10-CM | POA: Diagnosis present

## 2021-11-01 HISTORY — DX: Disorder of kidney and ureter, unspecified: N28.9

## 2021-11-01 LAB — COMPREHENSIVE METABOLIC PANEL
ALT: 31 U/L (ref 0–44)
AST: 33 U/L (ref 15–41)
Albumin: 4 g/dL (ref 3.5–5.0)
Alkaline Phosphatase: 79 U/L (ref 38–126)
Anion gap: 8 (ref 5–15)
BUN: 38 mg/dL — ABNORMAL HIGH (ref 8–23)
CO2: 22 mmol/L (ref 22–32)
Calcium: 9.2 mg/dL (ref 8.9–10.3)
Chloride: 111 mmol/L (ref 98–111)
Creatinine, Ser: 2.31 mg/dL — ABNORMAL HIGH (ref 0.61–1.24)
GFR, Estimated: 30 mL/min — ABNORMAL LOW (ref >60)
Glucose, Bld: 125 mg/dL — ABNORMAL HIGH (ref 70–99)
Potassium: 6.7 mmol/L (ref 3.5–5.1)
Sodium: 141 mmol/L (ref 135–145)
Total Bilirubin: 0.6 mg/dL (ref 0.3–1.2)
Total Protein: 6.9 g/dL (ref 6.5–8.1)

## 2021-11-01 LAB — CBC WITH DIFFERENTIAL/PLATELET
Abs Immature Granulocytes: 0.04 10*3/uL (ref 0.00–0.07)
Basophils Absolute: 0.1 10*3/uL (ref 0.0–0.1)
Basophils Relative: 1 %
Eosinophils Absolute: 0.2 10*3/uL (ref 0.0–0.5)
Eosinophils Relative: 3 %
HCT: 37.5 % — ABNORMAL LOW (ref 39.0–52.0)
Hemoglobin: 12.4 g/dL — ABNORMAL LOW (ref 13.0–17.0)
Immature Granulocytes: 1 %
Lymphocytes Relative: 42 %
Lymphs Abs: 2.7 10*3/uL (ref 0.7–4.0)
MCH: 27.6 pg (ref 26.0–34.0)
MCHC: 33.1 g/dL (ref 30.0–36.0)
MCV: 83.3 fL (ref 80.0–100.0)
Monocytes Absolute: 0.7 10*3/uL (ref 0.1–1.0)
Monocytes Relative: 10 %
Neutro Abs: 2.7 10*3/uL (ref 1.7–7.7)
Neutrophils Relative %: 43 %
Platelets: 194 10*3/uL (ref 150–400)
RBC: 4.5 MIL/uL (ref 4.22–5.81)
RDW: 13.7 % (ref 11.5–15.5)
WBC: 6.4 10*3/uL (ref 4.0–10.5)
nRBC: 0 % (ref 0.0–0.2)

## 2021-11-01 LAB — CBC
HCT: 36.9 % — ABNORMAL LOW (ref 39.0–52.0)
Hemoglobin: 12 g/dL — ABNORMAL LOW (ref 13.0–17.0)
MCH: 27.6 pg (ref 26.0–34.0)
MCHC: 32.5 g/dL (ref 30.0–36.0)
MCV: 84.8 fL (ref 80.0–100.0)
Platelets: 178 10*3/uL (ref 150–400)
RBC: 4.35 MIL/uL (ref 4.22–5.81)
RDW: 13.9 % (ref 11.5–15.5)
WBC: 6.9 10*3/uL (ref 4.0–10.5)
nRBC: 0.3 % — ABNORMAL HIGH (ref 0.0–0.2)

## 2021-11-01 LAB — CBG MONITORING, ED: Glucose-Capillary: 147 mg/dL — ABNORMAL HIGH (ref 70–99)

## 2021-11-01 LAB — CREATININE, SERUM
Creatinine, Ser: 2.54 mg/dL — ABNORMAL HIGH (ref 0.61–1.24)
GFR, Estimated: 27 mL/min — ABNORMAL LOW (ref >60)

## 2021-11-01 LAB — POTASSIUM: Potassium: 6 mmol/L — ABNORMAL HIGH (ref 3.5–5.1)

## 2021-11-01 LAB — TROPONIN I (HIGH SENSITIVITY)
Troponin I (High Sensitivity): 5 ng/L (ref <18)
Troponin I (High Sensitivity): 5 ng/L (ref <18)

## 2021-11-01 MED ORDER — EZETIMIBE 10 MG PO TABS
10.0000 mg | ORAL_TABLET | Freq: Every day | ORAL | Status: DC
  Administered 2021-11-02: 10 mg via ORAL
  Filled 2021-11-01: qty 1

## 2021-11-01 MED ORDER — ALPRAZOLAM 0.5 MG PO TABS
0.5000 mg | ORAL_TABLET | Freq: Every day | ORAL | Status: DC
  Administered 2021-11-01: 0.5 mg via ORAL
  Filled 2021-11-01: qty 1

## 2021-11-01 MED ORDER — ONDANSETRON HCL 4 MG/2ML IJ SOLN: 4.0000 mg | Freq: Four times a day (QID) | INTRAMUSCULAR | Status: DC | PRN

## 2021-11-01 MED ORDER — CALCIUM GLUCONATE-NACL 1-0.675 GM/50ML-% IV SOLN
1.0000 g | Freq: Once | INTRAVENOUS | Status: AC
  Administered 2021-11-01: 1000 mg via INTRAVENOUS
  Filled 2021-11-01: qty 50

## 2021-11-01 MED ORDER — INSULIN ASPART 100 UNIT/ML IJ SOLN
0.0000 [IU] | Freq: Three times a day (TID) | INTRAMUSCULAR | Status: DC
  Administered 2021-11-02 (×2): 8 [IU] via SUBCUTANEOUS
  Filled 2021-11-01 (×2): qty 1

## 2021-11-01 MED ORDER — ACETAMINOPHEN 325 MG PO TABS: 650.0000 mg | ORAL_TABLET | Freq: Four times a day (QID) | ORAL | Status: DC | PRN

## 2021-11-01 MED ORDER — ACETAMINOPHEN 650 MG RE SUPP: 650.0000 mg | Freq: Four times a day (QID) | RECTAL | Status: DC | PRN

## 2021-11-01 MED ORDER — METOPROLOL SUCCINATE ER 50 MG PO TB24
100.0000 mg | ORAL_TABLET | Freq: Two times a day (BID) | ORAL | Status: DC
  Administered 2021-11-01 – 2021-11-02 (×2): 100 mg via ORAL
  Filled 2021-11-01 (×2): qty 2

## 2021-11-01 MED ORDER — ASPIRIN 81 MG PO TBEC
81.0000 mg | DELAYED_RELEASE_TABLET | Freq: Every day | ORAL | Status: DC
  Administered 2021-11-01: 81 mg via ORAL
  Filled 2021-11-01: qty 1

## 2021-11-01 MED ORDER — INSULIN ASPART 100 UNIT/ML IJ SOLN: 0.0000 [IU] | Freq: Every day | INTRAMUSCULAR | Status: DC

## 2021-11-01 MED ORDER — INSULIN ASPART 100 UNIT/ML IJ SOLN
10.0000 [IU] | Freq: Once | INTRAMUSCULAR | Status: AC
  Administered 2021-11-01: 10 [IU] via INTRAVENOUS
  Filled 2021-11-01: qty 1

## 2021-11-01 MED ORDER — OXYCODONE HCL 5 MG PO TABS: 5.0000 mg | ORAL_TABLET | ORAL | Status: DC | PRN

## 2021-11-01 MED ORDER — PATIROMER SORBITEX CALCIUM 8.4 G PO PACK
16.8000 g | PACK | Freq: Every day | ORAL | Status: DC
  Administered 2021-11-01 – 2021-11-02 (×2): 16.8 g via ORAL
  Filled 2021-11-01 (×2): qty 2

## 2021-11-01 MED ORDER — DEXTROSE 50 % IV SOLN
1.0000 | Freq: Once | INTRAVENOUS | Status: AC
  Administered 2021-11-01: 50 mL via INTRAVENOUS
  Filled 2021-11-01: qty 50

## 2021-11-01 MED ORDER — ONDANSETRON HCL 4 MG PO TABS: 4.0000 mg | ORAL_TABLET | Freq: Four times a day (QID) | ORAL | Status: DC | PRN

## 2021-11-01 MED ORDER — CYCLOBENZAPRINE HCL 10 MG PO TABS: 10.0000 mg | ORAL_TABLET | Freq: Three times a day (TID) | ORAL | Status: DC | PRN

## 2021-11-01 MED ORDER — DILTIAZEM HCL ER COATED BEADS 240 MG PO CP24
240.0000 mg | ORAL_CAPSULE | Freq: Every day | ORAL | Status: DC
  Administered 2021-11-01 – 2021-11-02 (×2): 240 mg via ORAL
  Filled 2021-11-01 (×2): qty 1

## 2021-11-01 MED ORDER — ENOXAPARIN SODIUM 30 MG/0.3ML IJ SOSY
30.0000 mg | PREFILLED_SYRINGE | INTRAMUSCULAR | Status: DC
  Administered 2021-11-01: 30 mg via SUBCUTANEOUS
  Filled 2021-11-01: qty 0.3

## 2021-11-01 MED ORDER — ROSUVASTATIN CALCIUM 20 MG PO TABS
40.0000 mg | ORAL_TABLET | Freq: Every day | ORAL | Status: DC
  Administered 2021-11-01: 40 mg via ORAL
  Filled 2021-11-01: qty 2

## 2021-11-01 MED ORDER — MAGNESIUM OXIDE -MG SUPPLEMENT 400 (240 MG) MG PO TABS
600.0000 mg | ORAL_TABLET | Freq: Every day | ORAL | Status: DC
  Administered 2021-11-01 – 2021-11-02 (×2): 600 mg via ORAL
  Filled 2021-11-01 (×2): qty 2

## 2021-11-01 MED ORDER — ISOSORBIDE MONONITRATE ER 60 MG PO TB24
30.0000 mg | ORAL_TABLET | Freq: Every day | ORAL | Status: DC
  Administered 2021-11-02: 30 mg via ORAL
  Filled 2021-11-01: qty 1

## 2021-11-01 NOTE — Assessment & Plan Note (Signed)
We will treat with sliding scale insulin coverage.  Hold metformin due to AKI.

## 2021-11-01 NOTE — Assessment & Plan Note (Signed)
BP is controlled.  Continue metoprolol, diltiazem and enalapril pending med rec

## 2021-11-01 NOTE — Assessment & Plan Note (Signed)
No complaints of chest pain and troponin is normal Had a negative stress test in July 2023 Continue metoprolol, Imdur, rosuvastatin, aspirin and as needed sublingual nitroglycerin

## 2021-11-01 NOTE — Assessment & Plan Note (Signed)
Creatinine 2.31, up from 1.49 about 10 months prior Followed by nephrology

## 2021-11-01 NOTE — ED Notes (Signed)
Pt taken to room 33 for further care.

## 2021-11-01 NOTE — ED Notes (Signed)
BP cuff switched to larger one.  Box lunch given to pt as per verbal provider order.

## 2021-11-01 NOTE — Assessment & Plan Note (Signed)
Hemoglobin 12.4 which is down from 21 Being monitored by nephrology

## 2021-11-01 NOTE — Assessment & Plan Note (Signed)
Potassium 6.7, symptomatic for fatigue Received calcium gluconate, insulin and dextrose and [place] in the ED We will continue to monitor potassium and treat as appropriate Continuous cardiac monitoring Nephrology consulted to follow

## 2021-11-01 NOTE — Assessment & Plan Note (Signed)
Chronic opiates Continue Flexeril, gabapentin and oxycodone 5 mg every 4 hours as needed

## 2021-11-01 NOTE — ED Provider Notes (Signed)
[facility] Provider Note    Event Date/Time   First MD Initiated Contact with Patient 11/01/21 1652     (approximate)   History   abnormal labs   HPI  Kenneth Osborn is a 66 y.o. male who presents to the emergency department today after blood work performed by nephrologist yesterday showed hyperkalemia.  Patient has felt generalized fatigue recently.  Additionally family states they have noticed some increased confusion which she has had when his potassium is elevated in the past.  Patient denies any chest pain or palpitations. Says that at one time had been on medication to try to help keep his potassium low.     Physical Exam   Triage Vital Signs: ED Triage Vitals  Enc Vitals Group     BP 11/01/21 1504 104/63     Pulse Rate 11/01/21 1504 89     Resp 11/01/21 1504 18     Temp 11/01/21 1504 98.2 F (36.8 C)     Temp src --      SpO2 11/01/21 1504 94 %     Weight 11/01/21 1505 215 lb (97.5 kg)     Height --      Head Circumference --      Peak Flow --      Pain Score 11/01/21 1504 4     Pain Loc --      Pain Edu? --      Excl. in [place]? --     Most recent vital signs: Vitals:   11/01/21 1504  BP: 104/63  Pulse: 89  Resp: 18  Temp: 98.2 F (36.8 C)  SpO2: 94%    General: Awake, alert, oriented.  CV:  Good peripheral perfusion. Regular rate and rhythm. Resp:  Normal effort. Lungs clear. Abd:  No distention.    ED Results / Procedures / Treatments   Labs (all labs ordered are listed, but only abnormal results are displayed) Labs Reviewed  COMPREHENSIVE METABOLIC PANEL - Abnormal; Notable for the following components:      Result Value   Potassium 6.7 (*)    Glucose, Bld 125 (*)    BUN 38 (*)    Creatinine, Ser 2.31 (*)    GFR, Estimated 30 (*)    All other components within normal limits  CBC WITH DIFFERENTIAL/PLATELET - Abnormal; Notable for the following components:   Hemoglobin 12.4 (*)    HCT 37.5 (*)    All other  components within normal limits  TROPONIN I (HIGH SENSITIVITY)  TROPONIN I (HIGH SENSITIVITY)     EKG  I, Nance Pear, attending physician, personally viewed and interpreted this EKG  EKG Time: 1517 Rate: 75 Rhythm: sinus rhythm with PVC Axis: rightward axis Intervals: qtc 415 QRS: narrow, q waves v1 ST changes: no st elevation Impression: abnormal ekg   RADIOLOGY None   PROCEDURES:  Critical Care performed: No  Procedures   MEDICATIONS ORDERED IN ED: Medications - No data to display   IMPRESSION / MDM / [place] / ED COURSE  I reviewed the triage vital signs and the nursing notes.                              Differential diagnosis includes, but is not limited to, hyperkalemia, lab error  Patient's presentation is most consistent with acute presentation with potential threat to life or bodily function.  Patient presented to the emergency department today at  the request of nephrologist due to high potassium found on blood work yesterday.  Blood work here in the emergency department confirms hyperkalemia.  Did order medication to help start bringing potassium level down.  Discussed with Dr. Damita Dunnings with the hospital service will plan on admission.     FINAL CLINICAL IMPRESSION(S) / ED DIAGNOSES   Final diagnoses:  Hyperkalemia       Note:  This document was prepared using Dragon voice recognition software and may include unintentional dictation errors.    Nance Pear, MD 11/01/21 2020

## 2021-11-01 NOTE — H&P (Signed)
History and Physical    Patient: Kenneth Osborn YKD:[id number] DOB: [birth year] DOA: 11/01/2021 DOS: the patient was seen and examined on 11/01/2021 PCP: [name], MD  Patient coming from: Home  Chief Complaint:  Chief Complaint  Patient presents with   abnormal labs    HPI: Kenneth Osborn is a 66 y.o. male with medical history significant for Insulin-dependent type 2 diabetes, CAD, CKD 3, HTN, chronic musculoskeletal pain on chronic opiates, who was sent to the ED by his nephrologist for management of hyperkalemia of 7 at the office.  Patient was previously on potassium binders but not currently.  Family reports that over the past few days he has been somewhat fatigued and confused.   ED course and data review: Vitals within normal limits though BP borderline soft on arrival at 104/63.  Labs significant for potassium of 6.7, creatinine 2.31 above baseline of 1.5 about 10 months prior.  Bicarb normal at 22.  Hemoglobin 12.4, down from 16, ten months prior.  EKG showing sinus at 75 with otherwise no acute changes and no peak T waves. Patient was treated with insulin and dextrose, calcium gluconate and Veltassa. The ED provider spoke with nephrologist Dr. Candiss Norse who will follow patient while hospitalized.  Hospitalist consulted for admission.     Past Medical History:  Diagnosis Date   Acute postoperative pain 12/03/2018   Allergic rhinitis 12/30/2012   Anginal pain ([place])    Anxiety    Bronchitis    hx of   Can't get food down 08/12/2014   Cancer ([place]) 01/2020   kidney    Chronic back pain    thoracic area   Concussion 09/2015   Coronary artery disease    99% blockage   DDD (degenerative disc disease), cervical    Dehydration symptoms    2019   Diabetes mellitus without complication (HCC)    insulin dependent   Dysphagia    GERD (gastroesophageal reflux disease)    History of Meniere's disease 12/21/2014   History of thoracic spine surgery (S/P T9-10 IVD spacer)  12/21/2014   Hypercholesteremia    Hyperlipidemia    Hypertension    sees Dr. 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 Drema [place], [facility]   Neuromuscular disorder [facility])    diabetic neuropathy in feet   Renal disorder    Severe sepsis ([place]) 02/07/2020   Short-segment Barrett's esophagus    Sleep apnea    mild   Past Surgical History:  Procedure Laterality Date   APPENDECTOMY     age 56   ARTHRODESIS ANTERIOR ANTERIOR CERVICLE SPINE  01/04/2013   BACK SURGERY     fusion thoracic area   CARDIAC CATHETERIZATION     may 2012 and Nov 20, 2010   CARDIAC CATHETERIZATION N/A 06/15/2015   Procedure: Left Heart Cath and Coronary Angiography;  Surgeon: Corey Skains, MD;  Location: [place] CV LAB;  Service: Cardiovascular;  Laterality: N/A;   CARDIAC CATHETERIZATION N/A 06/15/2015   Procedure: Coronary Stent Intervention;  Surgeon: Isaias Cowman, MD;  Location: [place] CV LAB;  Service: Cardiovascular;  Laterality: N/A;   CARDIOVASCULAR STRESS TEST     jan 2014   COLONOSCOPY WITH PROPOFOL N/A 09/19/2014   Procedure: COLONOSCOPY WITH PROPOFOL;  Surgeon: Manya Silvas, MD;  Location: [facility] ENDOSCOPY;  Service: Endoscopy;  Laterality: N/A;   COLONOSCOPY WITH PROPOFOL N/A 04/03/2021  Procedure: COLONOSCOPY WITH PROPOFOL;  Surgeon: Lesly Rubenstein, MD;  Location: [facility] ENDOSCOPY;  Service: Endoscopy;  Laterality: N/A;  IDDM   CORONARY ANGIOPLASTY     stent placement x3   ESOPHAGOGASTRODUODENOSCOPY N/A 09/19/2014   Procedure: ESOPHAGOGASTRODUODENOSCOPY (EGD);  Surgeon: Manya Silvas, MD;  Location: [facility] ENDOSCOPY;  Service: Endoscopy;  Laterality: N/A;   IR PERC CHOLECYSTOSTOMY  02/09/2020   IR RADIOLOGIST EVAL & MGMT  03/14/2020   LABRINTHECTOMY     1999 right ear   LUMBAR SPINAL CORD SIMULATOR LEAD REMOVAL Right 08/09/2019   Procedure: REMOVAL SPINAL CORD STIMULATOR PERCUTANEOUS  LEADS, REMOVAL PULSE GENERATOR;  Surgeon: [name], MD;  Location: [facility] ORS;  Service: Neurosurgery;  Laterality: Right;  LOCAL WITH MAC   mastoid shunt Bilateral    left, 2002, 1997 right ear, 1980 right ear   PULSE GENERATOR IMPLANT N/A 01/18/2019   Procedure: MEDTRONIC SPINAL CORD STIMULATOR BATTERY EXCHANGE;  Surgeon: [name], MD;  Location: [facility] ORS;  Service: Neurosurgery;  Laterality: N/A;   ROBOT ASSISTED LAPAROSCOPIC NEPHRECTOMY Left 04/20/2020   Procedure: XI ROBOTIC ASSITED LAPAROSCOPIC NEPHRECTOMY;  Surgeon: Raynelle Bring, MD;  Location: [facility] ORS;  Service: Urology;  Laterality: Left;   SAVORY DILATION N/A 09/19/2014   Procedure: SAVORY DILATION;  Surgeon: Manya Silvas, MD;  Location: [facility] ENDOSCOPY;  Service: Endoscopy;  Laterality: N/A;   SHOULDER ARTHROSCOPY WITH SUBACROMIAL DECOMPRESSION Left 04/06/2012   Procedure: SHOULDER ARTHROSCOPY WITH SUBACROMIAL DECOMPRESSION;  Surgeon: Vickey Huger, MD;  Location: [place];  Service: Orthopedics;  Laterality: Left;  left shoulder arthroscopy, subacromial decompression and distal clavicle resection   SPINAL CORD STIMULATOR IMPLANT Right    Social History:  reports that he has never smoked. He has never used smokeless tobacco. He reports that he does not drink alcohol and does not use drugs.  No Known Allergies  Family History  Problem Relation Age of Onset   Heart disease Mother    Diabetes Mother    Heart disease Father    Cancer Sister    Heart disease Maternal Aunt    Heart disease Maternal Uncle    Diabetes Maternal Grandmother    Diabetes Paternal Grandmother     Prior to Admission medications   Medication Sig Start Date End Date Taking? Authorizing Provider  ALPRAZolam Duanne Moron) 0.5 MG tablet Take 1 tablet (0.5 mg total) by mouth at bedtime. 04/26/20   Little Ishikawa, MD  aspirin EC 81 MG tablet Take 81 mg by mouth at bedtime. Swallow whole.    [provider]  cetirizine (ZYRTEC) 10 MG tablet Take 10 mg by  mouth daily.     [provider]  cyclobenzaprine (FLEXERIL) 10 MG tablet Take 1 tablet (10 mg total) by mouth 3 (three) times daily as needed for muscle spasms. 04/26/20   Little Ishikawa, MD  diazepam (VALIUM) 5 MG tablet Take 1 tablet (5 mg total) by mouth See admin instructions. Take 5 mg in the morning and additional if needed for Meniere's disease up to three a day 04/26/20   Little Ishikawa, MD  diltiazem (CARDIZEM CD) 240 MG 24 hr capsule Take 240 mg by mouth daily. 03/30/18   [provider]  enalapril (VASOTEC) 10 MG tablet Take 10 mg by mouth daily. 09/07/20   [provider]  esomeprazole (NEXIUM) 40 MG capsule Take 40 mg by mouth 2 (two) times daily.    [provider]  ezetimibe (ZETIA) 10 MG tablet Take 10 mg by mouth daily. 03/26/17  [provider]  gabapentin (NEURONTIN) 800 MG tablet Take 800 mg by mouth 2 (two) times daily.    [provider]  Glucagon (BAQSIMI ONE PACK NA) Place 1 Dose into the nose daily as needed (severely low blood sugar).    [provider]  HUMULIN R U-500 KWIKPEN 500 UNIT/ML kwikpen Inject 40 Units into the skin 3 (three) times daily with meals. Patient taking differently: Inject 65-75 Units into the skin See admin instructions. Sliding scale  Can adjust according to blood sugar 01/19/18   Max Sane, MD  isosorbide mononitrate (IMDUR) 30 MG 24 hr tablet Take 30 mg by mouth daily.    [provider]  magnesium oxide (MAG-OX) 400 MG tablet Take 500 mg by mouth daily.    [provider]  Magnesium Oxide 500 MG TABS Take 500 mg by mouth daily.    [provider]  meclizine (ANTIVERT) 25 MG tablet Take 25 mg by mouth See admin instructions. Take 25 mg in the morning, and additional 25 mg if needed up to a total of three per day for Meniere's disease 07/06/15   [provider]  metFORMIN (GLUCOPHAGE) 1000 MG tablet Take 1,000 mg by mouth 2 (two) times daily.  07/25/20   [provider]  metoprolol succinate (TOPROL-XL) 100 MG 24 hr tablet Take 100 mg by mouth 2 (two) times daily. Take with or immediately following a meal.    [provider]  montelukast (SINGULAIR) 10 MG tablet Take 10 mg by mouth at bedtime.     [provider]  MOVANTIK 12.5 MG TABS tablet Take 12.5 mg by mouth daily. 02/06/21   [provider]  niacin (NIASPAN) 500 MG CR tablet Take 500 mg by mouth at bedtime.    [provider]  nitroGLYCERIN (NITROSTAT) 0.4 MG SL tablet Place 0.4 mg under the tongue as needed. 08/03/20   [provider]  oxyCODONE (OXY IR/ROXICODONE) 5 MG immediate release tablet Take 1 tablet (5 mg total) by mouth every 4 (four) hours as needed for severe pain. Must last 30 days. 10/07/21 11/06/21  Milinda Pointer, MD  oxyCODONE (OXY IR/ROXICODONE) 5 MG immediate release tablet Take 1 tablet (5 mg total) by mouth every 4 (four) hours as needed for severe pain. Must last 30 days. 11/06/21 12/06/21  Milinda Pointer, MD  oxyCODONE (OXY IR/ROXICODONE) 5 MG immediate release tablet Take 1 tablet (5 mg total) by mouth every 4 (four) hours as needed for severe pain. Must last 30 days. 12/06/21 01/05/22  Milinda Pointer, MD  polyethylene glycol (MIRALAX / GLYCOLAX) 17 g packet Take 17 g by mouth 2 (two) times daily. 04/26/20   Little Ishikawa, MD  prasugrel (EFFIENT) 10 MG TABS tablet Take 10 mg by mouth daily. 11/22/19   [provider]  rosuvastatin (CRESTOR) 40 MG tablet Take 40 mg by mouth at bedtime.    [provider]  senna-docusate (SENOKOT-S) 8.6-50 MG tablet Take 1 tablet by mouth 2 (two) times daily. 04/26/20   Little Ishikawa, MD    Physical Exam: Vitals:   11/01/21 1900 11/01/21 1915 11/01/21 1930 11/01/21 1932  BP: (!) 67/57  116/62   Pulse: 71 76 73 81  Resp: 19 (!) 22 20   Temp:   98.3 F (36.8 C) 98.3 F (36.8 C)  TempSrc:   Oral Oral  SpO2: 94% 97% 94%   Weight:        Physical Exam Vitals and nursing note reviewed.  Constitutional:  General: He is sleeping. He is not in acute distress. HENT:     Head: Normocephalic and atraumatic.  Cardiovascular:     Rate and Rhythm: Normal rate and regular rhythm.     Heart sounds: Normal heart sounds.  Pulmonary:     Effort: Pulmonary effort is normal.     Breath sounds: Normal breath sounds.  Abdominal:     Palpations: Abdomen is soft.     Tenderness: There is no abdominal tenderness.  Neurological:     Mental Status: He is easily aroused. Mental status is at baseline.     Labs on Admission: I have personally reviewed following labs and imaging studies  CBC: Recent Labs  Lab 11/01/21 1519  WBC 6.4  NEUTROABS 2.7  HGB 12.4*  HCT 37.5*  MCV 83.3  PLT 614   Basic Metabolic Panel: Recent Labs  Lab 11/01/21 1519  NA 141  K 6.7*  CL 111  CO2 22  GLUCOSE 125*  BUN 38*  CREATININE 2.31*  CALCIUM 9.2   GFR: Estimated Creatinine Clearance: 35.7 mL/min (A) (by C-G formula based on SCr of 2.31 mg/dL (H)). Liver Function Tests: Recent Labs  Lab 11/01/21 1519  AST 33  ALT 31  ALKPHOS 79  BILITOT 0.6  PROT 6.9  ALBUMIN 4.0   No results for input(s): "LIPASE", "AMYLASE" in the last 168 hours. No results for input(s): "AMMONIA" in the last 168 hours. Coagulation Profile: No results for input(s): "INR", "PROTIME" in the last 168 hours. Cardiac Enzymes: No results for input(s): "CKTOTAL", "CKMB", "CKMBINDEX", "TROPONINI" in the last 168 hours. BNP (last 3 results) No results for input(s): "PROBNP" in the last 8760 hours. HbA1C: No results for input(s): "HGBA1C" in the last 72 hours. CBG: No results for input(s): "GLUCAP" in the last 168 hours. Lipid Profile: No results for input(s): "CHOL", "HDL", "LDLCALC", "TRIG", "CHOLHDL", "LDLDIRECT" in the last 72 hours. Thyroid Function Tests: No results for input(s): "TSH", "T4TOTAL", "FREET4", "T3FREE", "THYROIDAB" in the last 72  hours. Anemia Panel: No results for input(s): "VITAMINB12", "FOLATE", "FERRITIN", "TIBC", "IRON", "RETICCTPCT" in the last 72 hours. Urine analysis:    Component Value Date/Time   COLORURINE YELLOW 12/11/2020 2202   APPEARANCEUR CLEAR 12/11/2020 2202   LABSPEC 1.023 12/11/2020 2202   PHURINE 5.0 12/11/2020 2202   GLUCOSEU >=500 (A) 12/11/2020 2202   HGBUR NEGATIVE 12/11/2020 2202   BILIRUBINUR NEGATIVE 12/11/2020 2202   KETONESUR NEGATIVE 12/11/2020 2202   PROTEINUR 100 (A) 12/11/2020 2202   NITRITE NEGATIVE 12/11/2020 2202   LEUKOCYTESUR NEGATIVE 12/11/2020 2202    Radiological Exams on Admission: No results found.   Data Reviewed: Relevant notes from primary care and specialist visits, past discharge summaries as available in EHR, including Care Everywhere. Prior diagnostic testing as pertinent to current admission diagnoses Updated medications and problem lists for reconciliation ED course, including vitals, labs, imaging, treatment and response to treatment Triage notes, nursing and pharmacy notes and ED provider's notes Notable results as noted in HPI   Assessment and Plan: * Hyperkalemia Potassium 6.7, symptomatic for fatigue Received calcium gluconate, insulin and dextrose and Veltassa in the ED We will continue to monitor potassium and treat as appropriate Continuous cardiac monitoring Nephrology consulted to follow  Acute renal failure superimposed on stage 3a chronic kidney disease (HCC) Creatinine 2.31, up from 1.49 about 10 months prior Followed by nephrology  Anemia in chronic kidney disease Hemoglobin 12.4 which is down from 65 Being monitored by nephrology  Type 2 diabetes mellitus with diabetic  polyneuropathy, with long-term current use of insulin (HCC) We will treat with sliding scale insulin coverage.  Hold metformin due to AKI.  CAD S/P percutaneous coronary angioplasty No complaints of chest pain and troponin is normal Had a negative stress  test in July 2023 Continue metoprolol, Imdur, rosuvastatin, aspirin and as needed sublingual nitroglycerin  Chronic musculoskeletal pain Chronic opiates Continue Flexeril, gabapentin and oxycodone 5 mg every 4 hours as needed  Benign essential HTN BP is controlled.  Continue metoprolol, diltiazem and enalapril pending med rec        DVT prophylaxis: Lovenox  Consults: Nephrology, Dr. Candiss Norse  Advance Care Planning:   Code Status: Prior   Family Communication: none  Disposition Plan: Back to previous home environment  Severity of Illness: The appropriate patient status for this patient is INPATIENT. Inpatient status is judged to be reasonable and necessary in order to provide the required intensity of service to ensure the patient's safety. The patient's presenting symptoms, physical exam findings, and initial radiographic and laboratory data in the context of their chronic comorbidities is felt to place them at high risk for further clinical deterioration. Furthermore, it is not anticipated that the patient will be medically stable for discharge from the hospital within 2 midnights of admission.   * I certify that at the point of admission it is my clinical judgment that the patient will require inpatient hospital care spanning beyond 2 midnights from the point of admission due to high intensity of service, high risk for further deterioration and high frequency of surveillance required.*  Author: Athena Masse, MD 11/01/2021 8:13 PM  For on call review [email or web address].

## 2021-11-01 NOTE — Progress Notes (Unsigned)
Unsuccessful attempt to contact patient for Virtual Visit (Pain Management Telehealth)   Patient provided contact information:  [phone] (home); [phone] (mobile); (Preferred) [phone] [email or web address]   Pre-screening:  Our staff was successful in contacting [name] using the above provided information.   I unsuccessfully attempted to make contact with Kenneth Osborn on 11/05/2021 via telephone. I was unable to complete the virtual encounter due to call going directly to voicemail. I was able to leave a message where I clearly identify myself as Gaspar Cola, MD and I left a message to call us back to reschedule the call.  Pharmacotherapy Assessment  Analgesic: Oxycodone IR 5 mg, 1 tab p.o. every 4 hours (30 mg/day of oxycodone) MME/day: 45 mg/day.   Follow-up plan:   Reschedule Visit.    Interventional Therapies  Risk  Complexity Considerations:   Estimated body mass index is 28.94 kg/m as calculated from the following:   Height as of this encounter: 5' 9"  (1.753 m).   Weight as of this encounter: 196 lb (88.9 kg). NOTE: EFFIENT Anticoagulation (Stop: 7-10 days  Re-start: 6 hrs)   Planned  Pending:   Palliative left lumbar facet RFA #3 (02/20/2021)    Under consideration:   CYP2D6 / CYP3A4 genetic testing.   Completed:   Palliative right PSIS MNB/TPI x3 (11/13/2017) (100/100/100)  Diagnostic/palliative right lumbar facet MBB x2 (09/22/2018) (100/100/75/75)  Diagnostic/palliative left lumbar facet MBB x2 (09/22/2018) (100/100/75/75)  Palliative left lumbar facet RFA x3 (02/20/2021) (85/85/55/60-70)  Palliative right lumbar facet RFA x3 (01/09/2021) (100/95/50/60-70)    Therapeutic  Palliative (PRN) options:   Palliative right PSIS MNB/TPI  Diagnostic/palliative lumbar facet MBB  Palliative lumbar facet RFA       Recent Visits Date Type Provider Dept  10/01/21 Office Visit Milinda Pointer, MD [facility]  Showing recent visits within past  90 days and meeting all other requirements Today's Visits Date Type Provider Dept  11/05/21 Office Visit Milinda Pointer, MD [facility]  Showing today's visits and meeting all other requirements Future Appointments Date Type Provider Dept  01/02/22 Appointment Milinda Pointer, MD [facility]  Showing future appointments within next 90 days and meeting all other requirements   Note by: Gaspar Cola, MD Date: 11/05/2021; Time: 3:58 PM

## 2021-11-01 NOTE — ED Triage Notes (Signed)
PT coming In pov from home for hyperkalemia. PT was seeing kidney dr for blood work. Pt was told that their k+ was 7.0.

## 2021-11-02 DIAGNOSIS — E875 Hyperkalemia: Secondary | ICD-10-CM | POA: Diagnosis not present

## 2021-11-02 LAB — CBC
HCT: 36.6 % — ABNORMAL LOW (ref 39.0–52.0)
Hemoglobin: 12 g/dL — ABNORMAL LOW (ref 13.0–17.0)
MCH: 26.9 pg (ref 26.0–34.0)
MCHC: 32.8 g/dL (ref 30.0–36.0)
MCV: 82.1 fL (ref 80.0–100.0)
Platelets: 172 10*3/uL (ref 150–400)
RBC: 4.46 MIL/uL (ref 4.22–5.81)
RDW: 13.7 % (ref 11.5–15.5)
WBC: 5.2 10*3/uL (ref 4.0–10.5)
nRBC: 0 % (ref 0.0–0.2)

## 2021-11-02 LAB — HEMOGLOBIN A1C
Hgb A1c MFr Bld: 7.4 % — ABNORMAL HIGH (ref 4.8–5.6)
Mean Plasma Glucose: 165.68 mg/dL

## 2021-11-02 LAB — BASIC METABOLIC PANEL
Anion gap: 7 (ref 5–15)
BUN: 37 mg/dL — ABNORMAL HIGH (ref 8–23)
CO2: 24 mmol/L (ref 22–32)
Calcium: 9.8 mg/dL (ref 8.9–10.3)
Chloride: 107 mmol/L (ref 98–111)
Creatinine, Ser: 2.18 mg/dL — ABNORMAL HIGH (ref 0.61–1.24)
GFR, Estimated: 32 mL/min — ABNORMAL LOW (ref >60)
Glucose, Bld: 304 mg/dL — ABNORMAL HIGH (ref 70–99)
Potassium: 5.9 mmol/L — ABNORMAL HIGH (ref 3.5–5.1)
Sodium: 138 mmol/L (ref 135–145)

## 2021-11-02 LAB — CBG MONITORING, ED
Glucose-Capillary: 256 mg/dL — ABNORMAL HIGH (ref 70–99)
Glucose-Capillary: 272 mg/dL — ABNORMAL HIGH (ref 70–99)

## 2021-11-02 LAB — HIV ANTIBODY (ROUTINE TESTING W REFLEX): HIV Screen 4th Generation wRfx: NONREACTIVE

## 2021-11-02 LAB — MAGNESIUM: Magnesium: 1.6 mg/dL — ABNORMAL LOW (ref 1.7–2.4)

## 2021-11-02 MED ORDER — SODIUM POLYSTYRENE SULFONATE 15 GM/60ML PO SUSP: 15.0000 g | Freq: Every day | ORAL | 0 refills | Status: AC

## 2021-11-02 MED ORDER — SODIUM CHLORIDE 0.9 % IV SOLN: INTRAVENOUS | Status: DC

## 2021-11-02 MED ORDER — HUMULIN R U-500 KWIKPEN 500 UNIT/ML ~~LOC~~ SOPN: 65.0000 [IU] | PEN_INJECTOR | SUBCUTANEOUS | Status: AC

## 2021-11-02 NOTE — Discharge Summary (Signed)
Physician Discharge Summary   Kenneth LEDBETTER  male DOB: 1955-11-17  HFW:[id number]  PCP: [name], MD  Admit date: 11/01/2021 Discharge date: 11/02/2021  Admitted From: home Disposition:  home CODE STATUS: Full code  Discharge Instructions     Discharge instructions   Complete by: As directed    Please take Kayexalate 15 g daily for 2 weeks to lower your potassium level.  Please follow up with your outpatient provider to check labs 1-2 weeks after discharge.   Dr. Enzo Bi [facility] Course:  For full details, please see H&P, progress notes, consult notes and ancillary notes.  Briefly,  Kenneth Osborn is a 66 y.o. male with medical history significant for Insulin-dependent type 2 diabetes, CAD, CKD 3, HTN, chronic musculoskeletal pain on chronic opiates, who was sent to the ED by his nephrologist for management of hyperkalemia of 7 at the office.  Patient was previously on potassium binders but not currently.  Family reports that over the past few days he has been somewhat fatigued and confused.    * Hyperkalemia Potassium 6.7, symptomatic for fatigue. Received calcium gluconate, insulin and dextrose and Veltassa in the ED. --Nephrology consulted and cleared pt for discharge on kayexalate 15 g daily for 2 weeks (chosen due to cost issue) and will follow up outpatient to recheck potassium levels.   Acute renal failure superimposed on stage 3a chronic kidney disease (HCC) Creatinine 2.31, up from 1.49 about 10 months prior.  Nephrology consulted and cleared pt for discharge and will follow as outpatient.   Anemia in chronic kidney disease Hemoglobin 12.4 which is down from 63 Being monitored by nephrology   Type 2 diabetes mellitus with diabetic polyneuropathy, with long-term current use of insulin (HCC) --discharge back on home regimen (see below)   CAD S/P percutaneous coronary angioplasty No complaints of chest pain and troponin is normal Had a  negative stress test in July 2023 --cont home ASA and statin   Chronic musculoskeletal pain on Chronic opiates Continue Flexeril, gabapentin and oxycodone 5 mg every 4 hours as needed   Benign essential HTN BP is controlled.   --d/c enalapril. --cont cardizem, Imdur, Toprol.  Anxiety --cont home Xanax.  Not currently taking Valium.   Discharge Diagnoses:  Principal Problem:   Hyperkalemia Active Problems:   Acute renal failure superimposed on stage 3a chronic kidney disease (HCC)   Anemia in chronic kidney disease   Benign essential HTN   Long term current use of opiate analgesic   Chronic musculoskeletal pain   CAD S/P percutaneous coronary angioplasty   Type 2 diabetes mellitus with diabetic polyneuropathy, with long-term current use of insulin [facility])     Discharge Instructions:  Allergies as of 11/02/2021   No Known Allergies      Medication List     STOP taking these medications    diazepam 5 MG tablet Commonly known as: VALIUM   enalapril 10 MG tablet Commonly known as: VASOTEC       TAKE these medications    ALPRAZolam 0.5 MG tablet Commonly known as: XANAX Take 1 tablet (0.5 mg total) by mouth at bedtime.   aspirin EC 81 MG tablet Take 81 mg by mouth at bedtime. Swallow whole.   BAQSIMI ONE PACK NA Place 1 Dose into the nose daily as needed (severely low blood sugar).   cetirizine 10 MG tablet Commonly known as: ZYRTEC Take 10 mg by mouth daily.   cyclobenzaprine 10  MG tablet Commonly known as: FLEXERIL Take 1 tablet (10 mg total) by mouth 3 (three) times daily as needed for muscle spasms.   diltiazem 240 MG 24 hr capsule Commonly known as: CARDIZEM CD Take 240 mg by mouth daily.   esomeprazole 40 MG capsule Commonly known as: NEXIUM Take 40 mg by mouth 2 (two) times daily.   ezetimibe 10 MG tablet Commonly known as: ZETIA Take 10 mg by mouth daily.   gabapentin 800 MG tablet Commonly known as: NEURONTIN Take 800 mg by mouth 2  (two) times daily.   HumuLIN R U-500 KwikPen 500 UNIT/ML KwikPen Generic drug: insulin regular human CONCENTRATED Inject 65-75 Units into the skin See admin instructions. Sliding scale Can adjust according to blood sugar Home med What changed:  how much to take when to take this additional instructions   isosorbide mononitrate 30 MG 24 hr tablet Commonly known as: IMDUR Take 30 mg by mouth daily.   Magnesium Oxide 250 MG Tabs Take 500 mg by mouth daily.   meclizine 25 MG tablet Commonly known as: ANTIVERT Take 25 mg by mouth See admin instructions. Take 25 mg in the morning, and additional 25 mg if needed up to a total of three per day for Meniere's disease   metFORMIN 1000 MG tablet Commonly known as: GLUCOPHAGE Take 1,000 mg by mouth 2 (two) times daily.   metoprolol succinate 100 MG 24 hr tablet Commonly known as: TOPROL-XL Take 100 mg by mouth 2 (two) times daily. Take with or immediately following a meal.   montelukast 10 MG tablet Commonly known as: SINGULAIR Take 10 mg by mouth at bedtime.   Movantik 12.5 MG Tabs tablet Generic drug: naloxegol oxalate Take 12.5 mg by mouth daily.   niacin 500 MG ER tablet Commonly known as: VITAMIN B3 Take 500 mg by mouth at bedtime.   nitroGLYCERIN 0.4 MG SL tablet Commonly known as: NITROSTAT Place 0.4 mg under the tongue as needed.   oxyCODONE 5 MG immediate release tablet Commonly known as: Oxy IR/ROXICODONE Take 1 tablet (5 mg total) by mouth every 4 (four) hours as needed for severe pain. Must last 30 days.   oxyCODONE 5 MG immediate release tablet Commonly known as: Oxy IR/ROXICODONE Take 1 tablet (5 mg total) by mouth every 4 (four) hours as needed for severe pain. Must last 30 days. Start taking on: November 06, 2021   oxyCODONE 5 MG immediate release tablet Commonly known as: Oxy IR/ROXICODONE Take 1 tablet (5 mg total) by mouth every 4 (four) hours as needed for severe pain. Must last 30 days. Start taking  on: December 06, 2021   polyethylene glycol 17 g packet Commonly known as: MIRALAX / GLYCOLAX Take 17 g by mouth 2 (two) times daily.   prasugrel 10 MG Tabs tablet Commonly known as: EFFIENT Take 10 mg by mouth daily.   rosuvastatin 40 MG tablet Commonly known as: CRESTOR Take 40 mg by mouth at bedtime.   senna-docusate 8.6-50 MG tablet Commonly known as: Senokot-S Take 1 tablet by mouth 2 (two) times daily.   sodium polystyrene 15 GM/60ML suspension Commonly known as: KAYEXALATE Take 60 mLs (15 g total) by mouth daily for 14 doses.         Follow-up Information     [name], MD Follow up in 1 week(s).   Specialty: Family Medicine Contact information: [address] [place] [address] [phone]  No Known Allergies   The results of significant diagnostics from this hospitalization (including imaging, microbiology, ancillary and laboratory) are listed below for reference.   Consultations:   Procedures/Studies: No results found.    Labs: BNP (last 3 results) No results for input(s): "BNP" in the last 8760 hours. Basic Metabolic Panel: Recent Labs  Lab 11/01/21 1519 11/01/21 2032 11/02/21 1022  NA 141  --  138  K 6.7* 6.0* 5.9*  CL 111  --  107  CO2 22  --  24  GLUCOSE 125*  --  304*  BUN 38*  --  37*  CREATININE 2.31* 2.54* 2.18*  CALCIUM 9.2  --  9.8  MG  --   --  1.6*   Liver Function Tests: Recent Labs  Lab 11/01/21 1519  AST 33  ALT 31  ALKPHOS 79  BILITOT 0.6  PROT 6.9  ALBUMIN 4.0   No results for input(s): "LIPASE", "AMYLASE" in the last 168 hours. No results for input(s): "AMMONIA" in the last 168 hours. CBC: Recent Labs  Lab 11/01/21 1519 11/01/21 2032 11/02/21 0924  WBC 6.4 6.9 5.2  NEUTROABS 2.7  --   --   HGB 12.4* 12.0* 12.0*  HCT 37.5* 36.9* 36.6*  MCV 83.3 84.8 82.1  PLT 194 178 172   Cardiac Enzymes: No results for input(s): "CKTOTAL", "CKMB", "CKMBINDEX",  "TROPONINI" in the last 168 hours. BNP: Invalid input(s): "POCBNP" CBG: Recent Labs  Lab 11/01/21 2142 11/02/21 0730 11/02/21 1133  GLUCAP 147* 256* 272*   D-Dimer No results for input(s): "DDIMER" in the last 72 hours. Hgb A1c Recent Labs    11/01/21 2032  HGBA1C 7.4*   Lipid Profile No results for input(s): "CHOL", "HDL", "LDLCALC", "TRIG", "CHOLHDL", "LDLDIRECT" in the last 72 hours. Thyroid function studies No results for input(s): "TSH", "T4TOTAL", "T3FREE", "THYROIDAB" in the last 72 hours.  Invalid input(s): "FREET3" Anemia work up No results for input(s): "VITAMINB12", "FOLATE", "FERRITIN", "TIBC", "IRON", "RETICCTPCT" in the last 72 hours. Urinalysis    Component Value Date/Time   COLORURINE YELLOW 12/11/2020 2202   APPEARANCEUR CLEAR 12/11/2020 2202   LABSPEC 1.023 12/11/2020 2202   PHURINE 5.0 12/11/2020 2202   GLUCOSEU >=500 (A) 12/11/2020 2202   HGBUR NEGATIVE 12/11/2020 2202   BILIRUBINUR NEGATIVE 12/11/2020 2202   KETONESUR NEGATIVE 12/11/2020 2202   PROTEINUR 100 (A) 12/11/2020 2202   NITRITE NEGATIVE 12/11/2020 2202   LEUKOCYTESUR NEGATIVE 12/11/2020 2202   Sepsis Labs Recent Labs  Lab 11/01/21 1519 11/01/21 2032 11/02/21 0924  WBC 6.4 6.9 5.2   Microbiology No results found for this or any previous visit (from the past 240 hour(s)).   Total time spend on discharging this patient, including the last patient exam, discussing the hospital stay, instructions for ongoing care as it relates to all pertinent caregivers, as well as preparing the medical discharge records, prescriptions, and/or referrals as applicable, is 35 minutes.    Enzo Bi, MD  [facility] 11/02/2021, 11:50 AM

## 2021-11-02 NOTE — Progress Notes (Signed)
Kenneth Osborn, [place] 11/02/21  Subjective:   Hospital day # 0   Patient known to our practice and is followed as outpatient by Dr. Holley Raring for chronic kidney disease. He has history of nephrectomy, hyperkalemia and chronic kidney disease stage IIIb.  Patient is also diabetic. He presented to the emergency room on 9/14 from home for hyperkalemia.  His potassium was greater than 7 therefore was referred to the ER for evaluation.  He is admitted under observation and has received shifting measures to manage high potassium.  Last evening at 8 PM, potassium was 6.0.   Renal: 09/14 0701 - 09/15 0700 In: 43.1 [IV Piggyback:43.1] Out: 400 [Urine:400] Lab Results  Component Value Date   CREATININE 2.18 (H) 11/02/2021   CREATININE 2.54 (H) 11/01/2021   CREATININE 2.31 (H) 11/01/2021     Objective:  Vital signs in last 24 hours:  Temp:  [98.2 F (36.8 C)-98.8 F (37.1 C)] 98.2 F (36.8 C) (09/15 0634) Pulse Rate:  [69-89] 81 (09/15 1018) Resp:  [16-23] 18 (09/15 1018) BP: (67-148)/(57-77) 148/77 (09/15 1018) SpO2:  [92 %-98 %] 95 % (09/15 1018) Weight:  [97.5 kg] 97.5 kg (09/15 0634)  Weight change:  Filed Weights   11/01/21 1505 11/02/21 0634  Weight: 97.5 kg 97.5 kg    Intake/Output:    Intake/Output Summary (Last 24 hours) at 11/02/2021 1303 Last data filed at 11/02/2021 0523 Gross per 24 hour  Intake 43.07 ml  Output 400 ml  Net -356.93 ml    Physical Exam: General:  No acute distress, laying in the bed  HEENT  anicteric, moist oral mucous membrane  Pulm/lungs  normal breathing effort, lungs are clear to auscultation  CVS/Heart  regular rhythm, no rub or gallop  Abdomen:   Soft, nontender  Extremities:  No peripheral edema  Neurologic:  Alert, oriented, able to follow commands  Skin:  No acute rashes     Basic Metabolic Panel:  Recent Labs  Lab 11/01/21 1519 11/01/21 2032 11/02/21 1022  NA 141  --  138  K 6.7* 6.0* 5.9*  CL  111  --  107  CO2 22  --  24  GLUCOSE 125*  --  304*  BUN 38*  --  37*  CREATININE 2.31* 2.54* 2.18*  CALCIUM 9.2  --  9.8  MG  --   --  1.6*     CBC: Recent Labs  Lab 11/01/21 1519 11/01/21 2032 11/02/21 0924  WBC 6.4 6.9 5.2  NEUTROABS 2.7  --   --   HGB 12.4* 12.0* 12.0*  HCT 37.5* 36.9* 36.6*  MCV 83.3 84.8 82.1  PLT 194 178 172      Lab Results  Component Value Date   HEPBSAG NON REACTIVE 02/08/2020   HEPBIGM NON REACTIVE 02/08/2020      Microbiology:  No results found for this or any previous visit (from the past 240 hour(s)).  Coagulation Studies: No results for input(s): "LABPROT", "INR" in the last 72 hours.  Urinalysis: No results for input(s): "COLORURINE", "LABSPEC", "PHURINE", "GLUCOSEU", "HGBUR", "BILIRUBINUR", "KETONESUR", "PROTEINUR", "UROBILINOGEN", "NITRITE", "LEUKOCYTESUR" in the last 72 hours.  Invalid input(s): "APPERANCEUR"    Imaging: No results found.   Medications:    sodium chloride 100 mL/hr at 11/02/21 0913    ALPRAZolam  0.5 mg Oral QHS   aspirin EC  81 mg Oral QHS   diltiazem  240 mg Oral Daily   enoxaparin (LOVENOX) injection  30 mg Subcutaneous Q24H   ezetimibe  10 mg Oral Daily   insulin aspart  0-15 Units Subcutaneous TID WC   insulin aspart  0-5 Units Subcutaneous QHS   isosorbide mononitrate  30 mg Oral Daily   magnesium oxide  600 mg Oral Daily   metoprolol succinate  100 mg Oral BID   patiromer  16.8 g Oral Daily   rosuvastatin  40 mg Oral QHS   acetaminophen **OR** acetaminophen, cyclobenzaprine, ondansetron **OR** ondansetron (ZOFRAN) IV, oxyCODONE  Assessment/ Plan:  66 y.o. male with coronary artery disease, hypertension, hyperlipidemia, history of renal cell carcinoma status post left nephrectomy, diabetes   admitted on 11/01/2021 for Hyperkalemia [E87.5]  #Hyperkalemia Patient has chronic hyperkalemia.  We discussed low potassium diet.  Patient denies any excessive potato products, chips, fries,  orange juice etc.  He was not able to afford Veltassa or Lokelma.  Recommended to start low-dose Kayexalate as outpatient.  Treated with Veltassa while in the hospital this time.  Sodium has improved to 5.9 this morning.  #Acute kidney injury on chronic kidney disease stage IIIb secondary to type 2 diabetes and left nephrectomy. Baseline creatinine of ?  1.77/GFR 42 from May 31, 2021. Recent increase in creatinine is noted.  Admit creatinine of 2.31 which h peaked at 2.54 but has improved to 2.18 today.  Follow-up as outpatient.     LOS: 0 Kenneth Osborn 9/15/20231:03 PM  Kenneth Osborn, Kenneth Osborn  Note: This note was prepared with Dragon dictation. Any transcription errors are unintentional

## 2021-11-02 NOTE — Progress Notes (Signed)
Inpatient Diabetes Program Recommendations  AACE/ADA: New Consensus Statement on Inpatient Glycemic Control (2015)  Target Ranges:  Prepandial:   less than 140 mg/dL      Peak postprandial:   less than 180 mg/dL (1-2 hours)      Critically ill patients:  140 - 180 mg/dL   Lab Results  Component Value Date   GLUCAP 256 (H) 11/02/2021   HGBA1C 7.4 (H) 11/01/2021    Review of Glycemic Control  Latest Reference Range & Units 11/01/21 21:42 11/02/21 07:30  Glucose-Capillary 70 - 99 mg/dL 147 (H) 256 (H)   Diabetes history: DM 2 Outpatient Diabetes medications: U500 65-75 units tid with meals, Metformin 1000 mg bid Current orders for Inpatient glycemic control:  Novolog 0-15 units tid with meals and HS  Inpatient Diabetes Program Recommendations:    Please consider adding Levemir 12 units bid.  Also please consider adding Novolog 4 units tid with meals (hold if patient eats less than 50% or NPO).    Thanks,  Adah Perl, RN, BC-ADM Inpatient Diabetes Coordinator Pager [phone]  (8a-5p)

## 2021-11-05 ENCOUNTER — Ambulatory Visit: Payer: Medicare Other | Attending: Pain Medicine | Admitting: Pain Medicine

## 2021-11-05 DIAGNOSIS — Z91199 Patient's noncompliance with other medical treatment and regimen due to unspecified reason: Secondary | ICD-10-CM

## 2021-11-06 ENCOUNTER — Telehealth: Payer: Self-pay | Admitting: Pain Medicine

## 2021-11-06 NOTE — Telephone Encounter (Signed)
PT stated that he didn't received his VV phone call on 11-05-21. Wants to know if doctor will be calling him on today.

## 2021-11-07 ENCOUNTER — Ambulatory Visit: Payer: Medicare Other | Attending: Pain Medicine | Admitting: Pain Medicine

## 2021-11-07 DIAGNOSIS — M5441 Lumbago with sciatica, right side: Secondary | ICD-10-CM

## 2021-11-07 DIAGNOSIS — M5410 Radiculopathy, site unspecified: Secondary | ICD-10-CM

## 2021-11-07 DIAGNOSIS — M961 Postlaminectomy syndrome, not elsewhere classified: Secondary | ICD-10-CM

## 2021-11-07 DIAGNOSIS — Z7901 Long term (current) use of anticoagulants: Secondary | ICD-10-CM

## 2021-11-07 DIAGNOSIS — M5137 Other intervertebral disc degeneration, lumbosacral region: Secondary | ICD-10-CM

## 2021-11-07 DIAGNOSIS — M79604 Pain in right leg: Secondary | ICD-10-CM

## 2021-11-07 DIAGNOSIS — G8929 Other chronic pain: Secondary | ICD-10-CM | POA: Insufficient documentation

## 2021-11-07 NOTE — Patient Instructions (Signed)
______________________________________________________________________  Preparing for Procedure with Sedation  NOTICE: Due to recent regulatory changes, starting on September 18, 2020, procedures requiring intravenous (IV) sedation will no longer be performed at the [facility].  These types of procedures are required to be performed at [facility] ambulatory surgery facility.  We are very sorry for the inconvenience.  Procedure appointments are limited to planned procedures: No Prescription Refills. No disability issues will be discussed. No medication changes will be discussed.  Instructions: Oral Intake: Do not eat or drink anything for at least 8 hours prior to your procedure. (Exception: Blood Pressure Medication. See below.) Transportation: A driver is required. You may not drive yourself after the procedure. Blood Pressure Medicine: Do not forget to take your blood pressure medicine with a sip of water the morning of the procedure. If your Diastolic (lower reading) is above 100 mmHg, elective cases will be cancelled/rescheduled. Blood thinners: These will need to be stopped for procedures. Notify our staff if you are taking any blood thinners. Depending on which one you take, there will be specific instructions on how and when to stop it. Diabetics on insulin: Notify the staff so that you can be scheduled 1st case in the morning. If your diabetes requires high dose insulin, take only  of your normal insulin dose the morning of the procedure and notify the staff that you have done so. Preventing infections: Shower with an antibacterial soap the morning of your procedure. Build-up your immune system: Take 1000 mg of Vitamin C with every meal (3 times a day) the day prior to your procedure. Antibiotics: Inform the staff if you have a condition or reason that requires you to take antibiotics before dental procedures. Pregnancy: If you are pregnant, call and cancel the procedure. Sickness: If  you have a cold, fever, or any active infections, call and cancel the procedure. Arrival: You must be in the facility at least 30 minutes prior to your scheduled procedure. Children: Do not bring children with you. Dress appropriately: There is always the possibility that your clothing may get soiled. Valuables: Do not bring any jewelry or valuables.  Reasons to call and reschedule or cancel your procedure: (Following these recommendations will minimize the risk of a serious complication.) Surgeries: Avoid having procedures within 2 weeks of any surgery. (Avoid for 2 weeks before or after any surgery). Flu Shots: Avoid having procedures within 2 weeks of a flu shots. (Avoid for 2 weeks before or after immunizations). Barium: Avoid having a procedure within 7-10 days after having had a radiological study involving the use of radiological contrast. (Myelograms, Barium swallow or enema study). Heart attacks: Avoid any elective procedures or surgeries for the initial 6 months after a "Myocardial Infarction" (Heart Attack). Blood thinners: It is imperative that you stop these medications before procedures. Let us know if you if you take any blood thinner.  Infection: Avoid procedures during or within two weeks of an infection (including chest colds or gastrointestinal problems). Symptoms associated with infections include: Localized redness, fever, chills, night sweats or profuse sweating, burning sensation when voiding, cough, congestion, stuffiness, runny nose, sore throat, diarrhea, nausea, vomiting, cold or Flu symptoms, recent or current infections. It is specially important if the infection is over the area that we intend to treat. Heart and lung problems: Symptoms that may suggest an active cardiopulmonary problem include: cough, chest pain, breathing difficulties or shortness of breath, dizziness, ankle swelling, uncontrolled high or unusually low blood pressure, and/or palpitations. If you are  experiencing any of these symptoms, cancel your procedure and contact your primary care physician for an evaluation.  Remember:  Regular Business hours are:  Monday to Thursday 8:00 AM to 4:00 PM  Provider's Schedule: Milinda Pointer, MD:  Procedure days: Tuesday and Thursday 7:30 AM to 4:00 PM  Gillis Santa, MD:  Procedure days: Monday and Wednesday 7:30 AM to 4:00 PM ______________________________________________________________________  ____________________________________________________________________________________________  General Risks and Possible Complications  Patient Responsibilities: It is important that you read this as it is part of your informed consent. It is our duty to inform you of the risks and possible complications associated with treatments offered to you. It is your responsibility as a patient to read this and to ask questions about anything that is not clear or that you believe was not covered in this document.  Patient's Rights: You have the right to refuse treatment. You also have the right to change your mind, even after initially having agreed to have the treatment done. However, under this last option, if you wait until the last second to change your mind, you may be charged for the materials used up to that point.  Introduction: Medicine is not an Chief Strategy Officer. Everything in Medicine, including the lack of treatment(s), carries the potential for danger, harm, or loss (which is by definition: Risk). In Medicine, a complication is a secondary problem, condition, or disease that can aggravate an already existing one. All treatments carry the risk of possible complications. The fact that a side effects or complications occurs, does not imply that the treatment was conducted incorrectly. It must be clearly understood that these can happen even when everything is done following the highest safety standards.  No treatment: You can choose not to proceed with the  proposed treatment alternative. The "PRO(s)" would include: avoiding the risk of complications associated with the therapy. The "CON(s)" would include: not getting any of the treatment benefits. These benefits fall under one of three categories: diagnostic; therapeutic; and/or palliative. Diagnostic benefits include: getting information which can ultimately lead to improvement of the disease or symptom(s). Therapeutic benefits are those associated with the successful treatment of the disease. Finally, palliative benefits are those related to the decrease of the primary symptoms, without necessarily curing the condition (example: decreasing the pain from a flare-up of a chronic condition, such as incurable terminal cancer).  General Risks and Complications: These are associated to most interventional treatments. They can occur alone, or in combination. They fall under one of the following six (6) categories: no benefit or worsening of symptoms; bleeding; infection; nerve damage; allergic reactions; and/or death. No benefits or worsening of symptoms: In Medicine there are no guarantees, only probabilities. No healthcare provider can ever guarantee that a medical treatment will work, they can only state the probability that it may. Furthermore, there is always the possibility that the condition may worsen, either directly, or indirectly, as a consequence of the treatment. Bleeding: This is more common if the patient is taking a blood thinner, either prescription or over the counter (example: Goody Powders, Fish oil, Aspirin, Garlic, etc.), or if suffering a condition associated with impaired coagulation (example: Hemophilia, cirrhosis of the liver, low platelet counts, etc.). However, even if you do not have one on these, it can still happen. If you have any of these conditions, or take one of these drugs, make sure to notify your treating physician. Infection: This is more common in patients with a compromised  immune system, either due to disease (example:  diabetes, cancer, human immunodeficiency virus [HIV], etc.), or due to medications or treatments (example: therapies used to treat cancer and rheumatological diseases). However, even if you do not have one on these, it can still happen. If you have any of these conditions, or take one of these drugs, make sure to notify your treating physician. Nerve Damage: This is more common when the treatment is an invasive one, but it can also happen with the use of medications, such as those used in the treatment of cancer. The damage can occur to small secondary nerves, or to large primary ones, such as those in the spinal cord and brain. This damage may be temporary or permanent and it may lead to impairments that can range from temporary numbness to permanent paralysis and/or brain death. Allergic Reactions: Any time a substance or material comes in contact with our body, there is the possibility of an allergic reaction. These can range from a mild skin rash (contact dermatitis) to a severe systemic reaction (anaphylactic reaction), which can result in death. Death: In general, any medical intervention can result in death, most of the time due to an unforeseen complication. ____________________________________________________________________________________________ ____________________________________________________________________________________________  Blood Thinners  IMPORTANT NOTICE:  If you take any of these, make sure to notify the nursing staff.  Failure to do so may result in injury.  Recommended time intervals to stop and restart blood-thinners, before & after invasive procedures  Generic Name Brand Name Pre-procedure. Stop this long before procedure. Post-procedure. Minimum waiting period before restarting.  Abciximab Reopro 15 days 2 hrs  Alteplase Activase 10 days 10 days  Anagrelide Agrylin    Apixaban Eliquis 3 days 6 hrs  Cilostazol Pletal  3 days 5 hrs  Clopidogrel Plavix 7-10 days 2 hrs  Dabigatran Pradaxa 5 days 6 hrs  Dalteparin Fragmin 24 hours 4 hrs  Dipyridamole Aggrenox 11days 2 hrs  Edoxaban Lixiana; Savaysa 3 days 2 hrs  Enoxaparin  Lovenox 24 hours 4 hrs  Eptifibatide Integrillin 8 hours 2 hrs  Fondaparinux  Arixtra 72 hours 12 hrs  Hydroxychloroquine Plaquenil 11 days   Prasugrel Effient 7-10 days 6 hrs  Reteplase Retavase 10 days 10 days  Rivaroxaban Xarelto 3 days 6 hrs  Ticagrelor Brilinta 5-7 days 6 hrs  Ticlopidine Ticlid 10-14 days 2 hrs  Tinzaparin Innohep 24 hours 4 hrs  Tirofiban Aggrastat 8 hours 2 hrs  Warfarin Coumadin 5 days 2 hrs   Other medications with blood-thinning effects  Product indications Generic (Brand) names Note  Cholesterol Lipitor Stop 4 days before procedure  Blood thinner (injectable) Heparin (LMW or LMWH Heparin) Stop 24 hours before procedure  Cancer Ibrutinib (Imbruvica) Stop 7 days before procedure  Malaria/Rheumatoid Hydroxychloroquine (Plaquenil) Stop 11 days before procedure  Thrombolytics  10 days before or after procedures   Over-the-counter (OTC) Products with blood-thinning effects  Product Common names Stop Time  Aspirin > 325 mg Goody Powders, Excedrin, etc. 11 days  Aspirin ? 81 mg  7 days  Fish oil  4 days  Garlic supplements  7 days  Ginkgo biloba  36 hours  Ginseng  24 hours  NSAIDs Ibuprofen, Naprosyn, etc. 3 days  Vitamin E  4 days   ____________________________________________________________________________________________

## 2021-11-07 NOTE — Progress Notes (Signed)
Patient: Kenneth Osborn  Service Category: E/M  Provider: Gaspar Cola, MD  DOB: August 17, 1955  DOS: 11/07/2021  Location: Office  MRN: [id number]  Setting: Ambulatory outpatient  Referring Provider: [name], MD  Type: Established Patient  Specialty: Interventional Pain Management  PCP: [name], MD  Location: Remote location  Delivery: TeleHealth     Virtual Encounter - Pain Management PROVIDER NOTE: Information contained herein reflects review and annotations entered in association with encounter. Interpretation of such information and data should be left to medically-trained personnel. Information provided to patient can be located elsewhere in the medical record under "Patient Instructions". Document created using STT-dictation technology, any transcriptional errors that may result from process are unintentional.    Contact & Pharmacy Preferred: [place]: [phone] (home) Mobile: [phone] (mobile) E-mail: [facility] [address] ([address]), [place]) [place] [address] Phone: [phone] Fax: [phone]  [facility] ([place], [place] [facility] [address] [place] [address] [place] [address] Phone: [phone] Fax: [phone]   Pre-screening  Kenneth Osborn offered "in-person" vs "virtual" encounter. He indicated preferring virtual for this encounter.   Reason COVID-19*  Social distancing based on CDC and AMA recommendations.   I contacted Kenneth Osborn on 11/07/2021 via telephone.      I clearly identified myself as Gaspar Cola, MD. I verified that I was speaking with the correct person using two identifiers (Name: Kenneth Osborn, and date of birth: October 28, 1955).  Consent I sought verbal advanced consent from Kenneth Osborn for virtual visit interactions. I informed Kenneth Osborn of possible security and privacy  concerns, risks, and limitations associated with providing "not-in-person" medical evaluation and management services. I also informed Kenneth Osborn of the availability of "in-person" appointments. Finally, I informed him that there would be a charge for the virtual visit and that he could be  personally, fully or partially, financially responsible for it. Kenneth Osborn expressed understanding and agreed to proceed.   Historic Elements   Kenneth Osborn is a 66 y.o. year old, male patient evaluated today after our last contact on 11/06/2021. Kenneth Osborn  has a past medical history of Acute postoperative pain (12/03/2018), Allergic rhinitis (12/30/2012), Anginal pain ([place]), Anxiety, Bronchitis, Can't get food down (08/12/2014), Cancer ([place]) (01/2020), Chronic back pain, Concussion (09/2015), Coronary artery disease, DDD (degenerative disc disease), cervical, Dehydration symptoms, Diabetes mellitus without complication ([place]), Dysphagia, GERD (gastroesophageal reflux disease), History of Meniere's disease (12/21/2014), History of thoracic spine surgery (S/P T9-10 IVD spacer) (12/21/2014), Hypercholesteremia, Hyperlipidemia, Hypertension, Meniere's disease, Myocardial infarction ([place]) (2012), Neuromuscular disorder ([place]), Renal disorder, Severe sepsis ([place]) (02/07/2020), Short-segment Barrett's esophagus, and Sleep apnea. He also  has a past surgical history that includes Cardiovascular stress test; Cardiac catheterization; Labrinthectomy; mastoid shunt (Bilateral); Back surgery; Shoulder arthroscopy with subacromial decompression (Left, 04/06/2012); ARTHRODESIS ANTERIOR ANTERIOR CERVICLE SPINE (01/04/2013); Colonoscopy with propofol (N/A, 09/19/2014); Esophagogastroduodenoscopy (N/A, 09/19/2014); Savory dilation (N/A, 09/19/2014); Spinal cord stimulator implant (Right); Cardiac catheterization (N/A, 06/15/2015); Cardiac catheterization (N/A, 06/15/2015); Pulse generator implant (N/A, 01/18/2019); Lumbar spinal cord simulator lead  removal (Right, 08/09/2019); IR Perc Cholecystostomy (02/09/2020); IR Radiologist Eval & Mgmt (03/14/2020); Coronary angioplasty; Appendectomy; Robot assisted laparoscopic nephrectomy (Left, 04/20/2020); and Colonoscopy with propofol (N/A, 04/03/2021). Kenneth Osborn has a current medication list which includes the following prescription(s): alprazolam, aspirin ec, cetirizine, cyclobenzaprine, diltiazem, esomeprazole, ezetimibe, gabapentin, glucagon, humulin r u-500 kwikpen, isosorbide mononitrate, magnesium  oxide, meclizine, metformin, metoprolol succinate, montelukast, movantik, niacin, nitroglycerin, oxycodone, oxycodone, [START ON 12/06/2021] oxycodone, polyethylene glycol, prasugrel, rosuvastatin, senna-docusate, and sodium polystyrene. He  reports that he has never smoked. He has never used smokeless tobacco. He reports that he does not drink alcohol and does not use drugs. Kenneth Osborn has No Known Allergies.   HPI  Today, he is being contacted for worsening of previously known (established) problem.  The patient requested today's encounter for the purpose of evaluating the possibility of further interventional therapies.  Based on the PMP, he is last prescription for oxycodone IR 5 mg tablets was filled on 10/14/2021 from a prescription written on 10/01/2021.  The PMP has also demonstrated that the patient continues to use benzodiazepines including diazepam 5 mg and alprazolam 0.5 mg tablets, 1 both of which are being prescribed by his primary care physician Dr. [name].  In the past I have reminded the patient that the concomitant use of these benzodiazepines along with the opioids increases the risk of an adverse reactions such as respiratory depression and death.  Today I will be sending a letter to his PCP since I have noticed that the patient is now being prescribed diazepam in addition to the alprazolam.  Today the patient indicated having increased low back pain and pain going down the right lower  extremity into the bottom of his foot and lateral portion of the foot with the pain running through the back of the leg, suggestive of a right S1 radiculopathy/radiculitis.  Today I had a long conversation with the patient regarding this since he was suggesting that we just do the radiofrequency ablation of the facet joints since that tends to provide him with 6 to 8 months of pain relief.  However, today I have explained to him that that treatment is particularly for the back pain and referred pain running down the legs, but the pattern that he is currently having in the right leg is not that of referred pain but more so of a radiculitis.  For this reason, I have recommended that we do either a right caudal ESI versus a right L5-S1 LESI.  Today I have confirmed that the patient is still taking anticoagulants, but to my surprise he indicates that he took it upon himself to stop his Effient and expectation for a nerve block.  He refers that the last time he took it was this past Saturday meaning that this would be day #4 off of the anticoagulant.  Today I have given the patient a warning about doing this without our specific consent since the Effient needs to be stopped 7 to 10 days prior to procedures meaning that we will not be able to add him to tomorrow schedule and the next time that we are able to bring him in would be this next Tuesday, which would be right at day #10.  He understood and accepted.  Today I have also explained to the patient that once we get the radicular component under control, then we will be able to go back and do the lumbar facet radiofrequency ablation.  The last time that we treated the right side was on 01/09/2021 and the last time we treated the left side was on 02/20/2021.  Radiofrequencies typically will last anywhere from 4 to 18 months and therefore he is right there at the average.  Pharmacotherapy Assessment   Opioid Analgesic: Oxycodone IR 5 mg, 1 tab p.o. every 4 hours (30  mg/day of oxycodone) MME/day:  45 mg/day.   Monitoring: [place] PMP: PDMP reviewed during this encounter.       Pharmacotherapy: No side-effects or adverse reactions reported. Compliance: No problems identified. Effectiveness: Clinically acceptable. Plan: Refer to "POC". UDS:  Summary  Date Value Ref Range Status  06/27/2021 Note  Final    Comment:    ==================================================================== [employer] Select 13 (MW) ==================================================================== Test                             Result       Flag       Units  Drug Present and Declared for Prescription Verification   Desmethyldiazepam              222          EXPECTED   ng/mg creat   Oxazepam                       665          EXPECTED   ng/mg creat   Temazepam                      411          EXPECTED   ng/mg creat    Desmethyldiazepam, oxazepam, and temazepam are expected metabolites    of diazepam. Desmethyldiazepam and oxazepam are also expected    metabolites of other drugs, including chlordiazepoxide, prazepam,    clorazepate, and halazepam. Oxazepam is an expected metabolite of    temazepam. Oxazepam and temazepam are also available as scheduled    prescription medications.    Oxycodone                      371          EXPECTED   ng/mg creat   Oxymorphone                    1345         EXPECTED   ng/mg creat   Noroxycodone                   511          EXPECTED   ng/mg creat   Noroxymorphone                 320          EXPECTED   ng/mg creat    Sources of oxycodone are scheduled prescription medications.    Oxymorphone, noroxycodone, and noroxymorphone are expected    metabolites of oxycodone. Oxymorphone is also available as a    scheduled prescription medication.  Drug Absent but Declared for Prescription Verification   Alprazolam                     Not Detected UNEXPECTED ng/mg  creat ==================================================================== Test                      Result    Flag   Units      Ref Range   Creatinine              65               mg/dL      >=20 ==================================================================== Declared Medications:  The flagging and interpretation on this report are based on the  following declared medications.  Unexpected results may arise from  inaccuracies in the declared medications.   **Note: The testing scope of this panel includes these medications:   Alprazolam (Xanax)  Diazepam (Valium)  Oxycodone (Roxicodone)   **Note: The testing scope of this panel does not include the  following reported medications:   Aspirin  Cetirizine (Zyrtec)  Cyclobenzaprine (Flexeril)  Diltiazem (Cardizem)  Docusate (Senokot-S)  Enalapril (Vasotec)  Esomeprazole (Nexium)  Ezetimibe (Zetia)  Gabapentin (Neurontin)  Glucagon  Insulin (Humulin)  Isosorbide (Imdur)  Magnesium (Mag-Ox)  Meclizine (Antivert)  Metformin (Glucophage)  Metoprolol (Toprol)  Montelukast (Singulair)  Naloxegol (Movantik)  Niacin  Nitroglycerin (Nitrostat)  Polyethylene Glycol (MiraLAX)  Prasugrel (Effient)  Rosuvastatin (Crestor)  Sennosides (Senokot-S) ==================================================================== For clinical consultation, please call [phone]. ====================================================================    No results found for: "CBDTHCR", "D8THCCBX", "D9THCCBX"   Laboratory Chemistry Profile   Renal Lab Results  Component Value Date   BUN 37 (H) 11/02/2021   CREATININE 2.18 (H) 11/02/2021   GFRAA 58 (L) 01/08/2019   GFRNONAA 32 (L) 11/02/2021    Hepatic Lab Results  Component Value Date   AST 33 11/01/2021   ALT 31 11/01/2021   ALBUMIN 4.0 11/01/2021   ALKPHOS 79 11/01/2021   HCVAB NON REACTIVE 02/08/2020   LIPASE 28 12/11/2020   AMMONIA 24 02/07/2020    Electrolytes Lab  Results  Component Value Date   NA 138 11/02/2021   K 5.9 (H) 11/02/2021   CL 107 11/02/2021   CALCIUM 9.8 11/02/2021   MG 1.6 (L) 11/02/2021   PHOS 3.1 11/09/2020    Bone No results found for: "VD25OH", "VD125OH2TOT", "UJ8119JY7", "WG9562ZH0", "25OHVITD1", "25OHVITD2", "25OHVITD3", "TESTOFREE", "TESTOSTERONE"  Inflammation (CRP: Acute Phase) (ESR: Chronic Phase) Lab Results  Component Value Date   CRP 1.4 (H) 03/22/2015   ESRSEDRATE 9 03/22/2015   LATICACIDVEN 1.5 12/11/2020         Note: Above Lab results reviewed.  Imaging  DG PAIN CLINIC C-ARM 1-60 MIN NO REPORT Fluoro was used, but no Radiologist interpretation will be provided.  Please refer to "NOTES" tab for provider progress note.  Assessment  The primary encounter diagnosis was Chronic low back pain (Right) w/ sciatica (Right). Diagnoses of Failed back surgical syndrome, Chronic lower extremity pain (Right), Chronic Lumbar radicular pain (Right: S1), DDD (degenerative disc disease), lumbosacral, and Chronic anticoagulation (Effient) were also pertinent to this visit.  Plan of Care  Problem-specific:  No problem-specific Assessment & Plan notes found for this encounter.  Mr. ADIL Osborn has a current medication list which includes the following long-term medication(s): cetirizine, cyclobenzaprine, esomeprazole, ezetimibe, gabapentin, humulin r u-500 kwikpen, metoprolol succinate, montelukast, niacin, oxycodone, oxycodone, and [START ON 12/06/2021] oxycodone.  Pharmacotherapy (Medications Ordered): No orders of the defined types were placed in this encounter.  Orders:  Orders Placed This Encounter  Procedures   Caudal Epidural Injection    Standing Status:   Future    Standing Expiration Date:   02/06/2022    Scheduling Instructions:     Laterality: Right-sided     Level(s): Sacrococcygeal canal (Tailbone area)     Sedation: Patient's choice     Scheduling Timeframe: As soon as pre-approved    Order  Specific Question:   Where will this procedure be performed?    Answer:   [facility]   Blood Thinner Instructions to Nursing    Always make sure patient has clearance from prescribing physician to stop blood thinners for interventional therapies. If the patient requires a Lovenox-bridge therapy, make  sure arrangements are made to institute it with the assistance of the PCP.    Scheduling Instructions:     Have Kenneth Osborn stop the Effient (Prasugrel) x 10 days prior to procedure or surgery.   Follow-up plan:   Return for procedure (Clinic): (R) Caudal ESI #1, (Blood Thinner Protocol).     Interventional Therapies  Risk  Complexity Considerations:   Estimated body mass index is 28.94 kg/m as calculated from the following:   Height as of this encounter: _0  (1.753 m).   Weight as of this encounter: 196 lb (88.9 kg). NOTE: EFFIENT Anticoagulation (Stop: 7-10 days  Re-start: 6 hrs)   Planned  Pending:   Diagnostic/therapeutic right caudal ESI #1  Palliative/therapeutic right lumbar facet RFA #4    Under consideration:   CYP2D6 / CYP3A4 genetic testing.   Completed:   Palliative right PSIS MNB/TPI x3 (11/13/2017) (100/100/100)  Diagnostic/palliative right lumbar facet MBB x2 (09/22/2018) (100/100/75/75)  Diagnostic/palliative left lumbar facet MBB x2 (09/22/2018) (100/100/75/75)  Palliative left lumbar facet RFA x3 (02/20/2021) (85/85/55/60-70)  Palliative right lumbar facet RFA x3 (01/09/2021) (100/95/50/60-70)    Therapeutic  Palliative (PRN) options:   Palliative right PSIS MNB/TPI  Diagnostic/palliative lumbar facet MBB  Palliative lumbar facet RFA     Recent Visits Date Type Provider Dept  11/05/21 Office Visit Milinda Pointer, [facility]  10/01/21 Office Visit Milinda Pointer, MD [facility]  Showing recent visits within past 90 days and meeting all other requirements Today's Visits Date Type Provider Dept  11/07/21 Office Visit  Milinda Pointer, MD [facility]  Showing today's visits and meeting all other requirements Future Appointments Date Type Provider Dept  01/02/22 Appointment Milinda Pointer, MD [facility]  Showing future appointments within next 90 days and meeting all other requirements  I discussed the assessment and treatment plan with the patient. The patient was provided an opportunity to ask questions and all were answered. The patient agreed with the plan and demonstrated an understanding of the instructions.  Patient advised to call back or seek an in-person evaluation if the symptoms or condition worsens.  Duration of encounter: 45 minutes.  Note by: Gaspar Cola, MD Date: 11/07/2021; Time: 9:09 AM

## 2021-11-13 ENCOUNTER — Ambulatory Visit
Admission: RE | Admit: 2021-11-13 | Discharge: 2021-11-13 | Disposition: A | Discharge status: Home / Self Care | Payer: Medicare Other | Source: Ambulatory Visit | Attending: Pain Medicine | Admitting: Pain Medicine

## 2021-11-13 ENCOUNTER — Other Ambulatory Visit: Payer: Self-pay

## 2021-11-13 ENCOUNTER — Ambulatory Visit: Payer: Medicare Other | Attending: Pain Medicine | Admitting: Pain Medicine

## 2021-11-13 ENCOUNTER — Encounter: Payer: Self-pay | Admitting: Pain Medicine

## 2021-11-13 VITALS — BP 92/70 | HR 90 | Temp 98.2°F | Resp 18 | Ht 69.0 in | Wt 215.0 lb

## 2021-11-13 DIAGNOSIS — Z7901 Long term (current) use of anticoagulants: Secondary | ICD-10-CM | POA: Diagnosis present

## 2021-11-13 DIAGNOSIS — M79604 Pain in right leg: Secondary | ICD-10-CM | POA: Diagnosis present

## 2021-11-13 DIAGNOSIS — G8929 Other chronic pain: Secondary | ICD-10-CM | POA: Diagnosis present

## 2021-11-13 DIAGNOSIS — M5441 Lumbago with sciatica, right side: Secondary | ICD-10-CM | POA: Diagnosis present

## 2021-11-13 DIAGNOSIS — M5137 Other intervertebral disc degeneration, lumbosacral region: Secondary | ICD-10-CM | POA: Insufficient documentation

## 2021-11-13 DIAGNOSIS — M961 Postlaminectomy syndrome, not elsewhere classified: Secondary | ICD-10-CM | POA: Diagnosis present

## 2021-11-13 DIAGNOSIS — M5410 Radiculopathy, site unspecified: Secondary | ICD-10-CM | POA: Insufficient documentation

## 2021-11-13 MED ORDER — IOHEXOL 180 MG/ML  SOLN
10.0000 mL | Freq: Once | INTRAMUSCULAR | Status: AC
  Administered 2021-11-13: 10 mL via EPIDURAL
  Filled 2021-11-13: qty 20

## 2021-11-13 MED ORDER — MIDAZOLAM HCL 5 MG/5ML IJ SOLN
0.5000 mg | Freq: Once | INTRAMUSCULAR | Status: AC
  Administered 2021-11-13: 1.5 mg via INTRAVENOUS
  Filled 2021-11-13: qty 5

## 2021-11-13 MED ORDER — SODIUM CHLORIDE (PF) 0.9 % IJ SOLN
INTRAMUSCULAR | Status: AC
  Filled 2021-11-13: qty 10

## 2021-11-13 MED ORDER — LIDOCAINE HCL 2 % IJ SOLN
20.0000 mL | Freq: Once | INTRAMUSCULAR | Status: AC
  Administered 2021-11-13: 400 mg
  Filled 2021-11-13: qty 20

## 2021-11-13 MED ORDER — SODIUM CHLORIDE 0.9% FLUSH
2.0000 mL | Freq: Once | INTRAVENOUS | Status: AC
  Administered 2021-11-13: 2 mL

## 2021-11-13 MED ORDER — PENTAFLUOROPROP-TETRAFLUOROETH EX AERO: INHALATION_SPRAY | Freq: Once | CUTANEOUS | Status: DC

## 2021-11-13 MED ORDER — LACTATED RINGERS IV SOLN: Freq: Once | INTRAVENOUS | Status: AC

## 2021-11-13 MED ORDER — ROPIVACAINE HCL 2 MG/ML IJ SOLN
2.0000 mL | Freq: Once | INTRAMUSCULAR | Status: AC
  Administered 2021-11-13: 2 mL via EPIDURAL
  Filled 2021-11-13: qty 20

## 2021-11-13 MED ORDER — TRIAMCINOLONE ACETONIDE 40 MG/ML IJ SUSP
40.0000 mg | Freq: Once | INTRAMUSCULAR | Status: AC
  Administered 2021-11-13: 40 mg
  Filled 2021-11-13: qty 1

## 2021-11-13 NOTE — Patient Instructions (Signed)

## 2021-11-13 NOTE — Progress Notes (Signed)
Safety precautions to be maintained throughout the outpatient stay will include: orient to surroundings, keep bed in low position, maintain call bell within reach at all times, provide assistance with transfer out of bed and ambulation.  

## 2021-11-13 NOTE — Progress Notes (Signed)
PROVIDER NOTE: Interpretation of information contained herein should be left to medically-trained personnel. Specific patient instructions are provided elsewhere under "Patient Instructions" section of medical record. This document was created in part using STT-dictation technology, any transcriptional errors that may result from this process are unintentional.  Patient: Kenneth Osborn Type: Established DOB: 1955/04/23 MRN: [id number] PCP: [name], MD  Service: Procedure DOS: 11/13/2021 Setting: Ambulatory Location: Ambulatory outpatient facility Delivery: Face-to-face Provider: Gaspar Cola, MD Specialty: Interventional Pain Management Specialty designation: 09 Location: Outpatient facility Ref. Prov.: [name], MD    Interventional Therapy      Procedure: Caudal Epidural Steroid Injection + Epidurogram   #1  Laterality: Midline aiming right Level: Sacrococcygeal ligament  Imaging: Fluoroscopy-guided         Anesthesia: Local anesthesia (1-2% Lidocaine) Anxiolysis: IV Versed 1.5 mg Sedation: No Sedation                       DOS: 11/13/2021  Performed by: Gaspar Cola, MD  Purpose: Diagnostic/Therapeutic Indications: Low back and lower extremity pain severe enough to impact quality of life or function. Rationale (medical necessity): procedure needed and proper for the diagnosis and/or treatment of Kenneth Osborn's medical symptoms and needs. 1. 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 Chronic lower extremity pain (Right)   3. Chronic Lumbar radicular pain (Right: S1)   4. DDD (degenerative disc disease), lumbosacral   5. Failed back surgical syndrome   6. Chronic anticoagulation (Effient)    NAS-11 Pain score:   Pre-procedure: 4 /10   Post-procedure: 0-No pain/10     Target: Lumbosacral epidural canal  Location: Epidural space  Region: Caudal canal Approach: Percutaneous  Type of procedure: Epidural block  Position  Prep  Materials:   Position: Prone  Prep solution: DuraPrep (Iodine Povacrylex [0.7% available iodine] and Isopropyl Alcohol, 74% w/w) Prep Area: Entire posterior lumbosacral area  Materials:  Tray: Epidural          Needle(s):  Type: Epidural  Gauge (G): 17  Length: 3.5-in  Qty: 1  Pre-op H&P Assessment:  Kenneth Osborn is a 66 y.o. (year old), male patient, seen today for interventional treatment. He  has a past surgical history that includes Cardiovascular stress test; Cardiac catheterization; Labrinthectomy; mastoid shunt (Bilateral); Back surgery; Shoulder arthroscopy with subacromial decompression (Left, 04/06/2012); ARTHRODESIS ANTERIOR ANTERIOR CERVICLE SPINE (01/04/2013); Colonoscopy with propofol (N/A, 09/19/2014); Esophagogastroduodenoscopy (N/A, 09/19/2014); Savory dilation (N/A, 09/19/2014); Spinal cord stimulator implant (Right); Cardiac catheterization (N/A, 06/15/2015); Cardiac catheterization (N/A, 06/15/2015); Pulse generator implant (N/A, 01/18/2019); Lumbar spinal cord simulator lead removal (Right, 08/09/2019); IR Perc Cholecystostomy (02/09/2020); IR Radiologist Eval & Mgmt (03/14/2020); Coronary angioplasty; Appendectomy; Robot assisted laparoscopic nephrectomy (Left, 04/20/2020); and Colonoscopy with propofol (N/A, 04/03/2021). Kenneth Osborn has a current medication list which includes the following prescription(s): alprazolam, aspirin ec, cetirizine, cyclobenzaprine, diltiazem, esomeprazole, ezetimibe, gabapentin, glucagon, isosorbide mononitrate, magnesium oxide, meclizine, metformin, metoprolol succinate, montelukast, niacin, nitroglycerin, oxycodone, [START ON 12/06/2021] oxycodone, prasugrel, rosuvastatin, senna-docusate, sodium polystyrene, humulin r u-500 kwikpen, movantik, oxycodone, and polyethylene glycol, and the following Facility-Administered Medications: pentafluoroprop-tetrafluoroeth. His primarily concern today is the Back Pain (Lower worse on the right. )  Initial Vital Signs:  Pulse/HCG Rate: 90ECG  Heart Rate: 82 Temp: (!) 97.5 F (36.4 C) Resp: 16 BP: 106/76 SpO2: 95 %  BMI: Estimated body mass index is 31.75 kg/m as calculated from the following:   Height as of this encounter: 5' 9"  (1.753 m).  Weight as of this encounter: 215 lb (97.5 kg).  Risk Assessment: Allergies: Reviewed. He has No Known Allergies.  Allergy Precautions: None required Coagulopathies: Reviewed. None identified.  Blood-thinner therapy: None at this time Active Infection(s): Reviewed. None identified. Kenneth Osborn is afebrile  Site Confirmation: Kenneth Osborn was asked to confirm the procedure and laterality before marking the site Procedure checklist: Completed Consent: Before the procedure and under the influence of no sedative(s), amnesic(s), or anxiolytics, the patient was informed of the treatment options, risks and possible complications. To fulfill our ethical and legal obligations, as recommended by the American Medical Association's Code of Ethics, I have informed the patient of my clinical impression; the nature and purpose of the treatment or procedure; the risks, benefits, and possible complications of the intervention; the alternatives, including doing nothing; the risk(s) and benefit(s) of the alternative treatment(s) or procedure(s); and the risk(s) and benefit(s) of doing nothing. The patient was provided information about the general risks and possible complications associated with the procedure. These may include, but are not limited to: failure to achieve desired goals, infection, bleeding, organ or nerve damage, allergic reactions, paralysis, and death. In addition, the patient was informed of those risks and complications associated to Spine-related procedures, such as failure to decrease pain; infection (i.e.: Meningitis, epidural or intraspinal abscess); bleeding (i.e.: epidural hematoma, subarachnoid hemorrhage, or any other type of intraspinal or peri-dural bleeding); organ or nerve damage (i.e.:  Any type of peripheral nerve, nerve root, or spinal cord injury) with subsequent damage to sensory, motor, and/or autonomic systems, resulting in permanent pain, numbness, and/or weakness of one or several areas of the body; allergic reactions; (i.e.: anaphylactic reaction); and/or death. Furthermore, the patient was informed of those risks and complications associated with the medications. These include, but are not limited to: allergic reactions (i.e.: anaphylactic or anaphylactoid reaction(s)); adrenal axis suppression; blood sugar elevation that in diabetics may result in ketoacidosis or comma; water retention that in patients with history of congestive heart failure may result in shortness of breath, pulmonary edema, and decompensation with resultant heart failure; weight gain; swelling or edema; medication-induced neural toxicity; particulate matter embolism and blood vessel occlusion with resultant organ, and/or nervous system infarction; and/or aseptic necrosis of one or more joints. Finally, the patient was informed that Medicine is not an exact science; therefore, there is also the possibility of unforeseen or unpredictable risks and/or possible complications that may result in a catastrophic outcome. The patient indicated having understood very clearly. We have given the patient no guarantees and we have made no promises. Enough time was given to the patient to ask questions, all of which were answered to the patient's satisfaction. Mr. Eagon has indicated that he wanted to continue with the procedure. Attestation: I, the ordering provider, attest that I have discussed with the patient the benefits, risks, side-effects, alternatives, likelihood of achieving goals, and potential problems during recovery for the procedure that I have provided informed consent. Date  Time: 11/13/2021 10:42 AM  Pre-Procedure Preparation:  Monitoring: As per clinic protocol. Respiration, ETCO2, SpO2, BP, heart rate and  rhythm monitor placed and checked for adequate function Safety Precautions: Patient was assessed for positional comfort and pressure points before starting the procedure. Time-out: I initiated and conducted the "Time-out" before starting the procedure, as per protocol. The patient was asked to participate by confirming the accuracy of the "Time Out" information. Verification of the correct person, site, and procedure were performed and confirmed by me, the nursing staff, and the  patient. "Time-out" conducted as per Joint Commission's Universal Protocol (UP.01.01.01). Time: 1220  Description  Narrative of Procedure:          Procedural Technique Safety Precautions: Aspiration looking for blood return was conducted prior to all injections. At no point did we inject any substances, as a needle was being advanced. No attempts were made at seeking any paresthesias. Safe injection practices and needle disposal techniques used. Medications properly checked for expiration dates. SDV (single dose vial) medications used. Description of the Procedure: Protocol guidelines were followed. The patient was assisted into a comfortable position. The target area was identified and the area prepped in the usual manner. Skin & deeper tissues infiltrated with local anesthetic. Appropriate amount of time allowed to pass for local anesthetics to take effect. The procedure needles were then advanced to the target area. Proper needle placement secured. Negative aspiration confirmed. Solution injected in intermittent fashion, asking for systemic symptoms every 0.5cc of injectate. The needles were then removed and the area cleansed, making sure to leave some of the prepping solution back to take advantage of its long term bactericidal properties.  Technical description of procedure:  Safety Precautions: Aspiration looking for blood return was conducted prior to all injections. At no point did we inject any substances, as a needle  was being advanced. No attempts were made at seeking any paresthesias. Safe injection practices and needle disposal techniques used. Medications properly checked for expiration dates. SDV (single dose vial) medications used. Description of the Procedure: Protocol guidelines were followed. The patient was placed in position over the fluoroscopy table. The target area was identified and the area prepped in the usual manner. Skin & deeper tissues infiltrated with local anesthetic. Appropriate amount of time allowed to pass for local anesthetics to take effect. The procedure needle was then advanced to the target area. Proper needle placement secured. Negative aspiration confirmed.  Solution injected in intermittent fashion, asking for systemic symptoms every 0.5cc of injectate. The needle/catheter were removed and the area cleansed, making sure to leave some of the prepping solution back to take advantage of its long term bactericidal properties.  Vitals:   11/13/21 1226 11/13/21 1230 11/13/21 1236 11/13/21 1244  BP: 116/84 100/73 107/82 92/70  Pulse:      Resp: 17 17 18 18   Temp:    98.2 F (36.8 C)  TempSrc:      SpO2: 93% 97% 94% 97%  Weight:      Height:         Start Time: 1220 hrs. End Time: 1235 hrs.  Imaging Guidance (Spinal):          Type of Imaging Technique: Fluoroscopy Guidance (Spinal) Indication(s): Assistance in needle guidance and placement for procedures requiring needle placement in or near specific anatomical locations not easily accessible without such assistance. Exposure Time: Please see nurses notes. Contrast: Before injecting any contrast, we confirmed that the patient did not have an allergy to iodine, shellfish, or radiological contrast. Once satisfactory needle placement was completed at the desired level, radiological contrast was injected. Contrast injected under live fluoroscopy. No contrast complications. See chart for type and volume of contrast  used. Fluoroscopic Guidance: I was personally present during the use of fluoroscopy. "Tunnel Vision Technique" used to obtain the best possible view of the target area. Parallax error corrected before commencing the procedure. "Direction-depth-direction" technique used to introduce the needle under continuous pulsed fluoroscopy. Once target was reached, antero-posterior, oblique, and lateral fluoroscopic projection used confirm needle placement in  all planes. Images permanently stored in EMR. Interpretation: I personally interpreted the imaging intraoperatively. Adequate needle placement confirmed in multiple planes. Appropriate spread of contrast into desired area was observed. No evidence of afferent or efferent intravascular uptake. No intrathecal or subarachnoid spread observed. Permanent images saved into the patient's record.  Diagnostic Epidurogram:  Contrast:  Type: Non-ionic, water soluble, hypoallergenic, myelogram-compatible, radiological contrast used. Please see orders and nurses note for specific choice of contrast. Volume: Please see nurses note for injected volume.  Observations:  Spinal Alignment: Adequate       Vertebral body: Intact Lamina: Intact Disc: Disc hight preserved Facet: Within Normal Limits.        Hardware: None  Spread: Appropriate epidural spread of contrast Anterior: Adequate Posterior: Adequate Superior (cephalad): Adequate Inferior (caudad): Adequate Right lateral: Adequate Left lateral: Adequate Plica medialis dorsalis: Medially aligned Nerve root(s): Adequate  Epidural extravasation: None observed Intrathecal: No intrathecal spread identified Subarachnoid: No subarachnoid spread pattern observed Vascular: No evidence of afferent or efferent intravascular uptake  Impression: Technically successful epidurogram. See above for details Note: Electronic copies saved to PACS (EMR).  Post-operative Assessment:  Post-procedure Vital Signs:  Pulse/HCG  Rate: 9086 Temp: 98.2 F (36.8 C) Resp: 18 BP: 92/70 SpO2: 97 %  EBL: None  Complications: No immediate post-treatment complications observed by team, or reported by patient.  Note: The patient tolerated the entire procedure well. A repeat set of vitals were taken after the procedure and the patient was kept under observation following institutional policy, for this type of procedure. Post-procedural neurological assessment was performed, showing return to baseline, prior to discharge. The patient was provided with post-procedure discharge instructions, including a section on how to identify potential problems. Should any problems arise concerning this procedure, the patient was given instructions to immediately contact us, at any time, without hesitation. In any case, we plan to contact the patient by telephone for a follow-up status report regarding this interventional procedure.  Comments:  No additional relevant information.  Plan of Care  Orders:  Orders Placed This Encounter  Procedures   Caudal Epidural Injection    Scheduling Instructions:     Laterality: Right-sided     Level(s): Sacrococcygeal canal (Tailbone area)     Sedation: Patient's choice     Timeframe: Today    Order Specific Question:   Where will this procedure be performed?    Answer:   [facility]   DG [facility] C-ARM 1-60 MIN NO REPORT    Intraoperative interpretation by procedural physician at [place].    Standing Status:   Standing    Number of Occurrences:   1    Order Specific Question:   Reason for exam:    Answer:   Assistance in needle guidance and placement for procedures requiring needle placement in or near specific anatomical locations not easily accessible without such assistance.   Informed Consent Details: Physician/Practitioner Attestation; Transcribe to consent form and obtain patient signature    Nursing Order: Transcribe to consent form and obtain patient  signature. Note: Always confirm laterality of pain with Mr. Beckner, before procedure.    Order Specific Question:   Physician/Practitioner attestation of informed consent for procedure/surgical case    Answer:   I, the physician/practitioner, attest that I have discussed with the patient the benefits, risks, side effects, alternatives, likelihood of achieving goals and potential problems during recovery for the procedure that I have provided informed consent.    Order Specific Question:  Procedure    Answer:   Caudal epidural steroid injection    Order Specific Question:   Physician/Practitioner performing the procedure    Answer:   Alnita Aybar A. Dossie Arbour, MD    Order Specific Question:   Indication/Reason    Answer:   Low back pain and lower extremity pain secondary to lumbosacral radiculitis   Care order/instruction: Please confirm that the patient has stopped the Effient (Prasugrel) x 10 days prior to procedure or surgery.    Please confirm that the patient has stopped the Effient (Prasugrel) x 10 days prior to procedure or surgery.    Standing Status:   Standing    Number of Occurrences:   1   Provide equipment / supplies at bedside    "Epidural Tray" (Disposable  single use) Catheter: NOT required    Standing Status:   Standing    Number of Occurrences:   1    Order Specific Question:   Specify    Answer:   Epidural Tray   Bleeding precautions    Standing Status:   Standing    Number of Occurrences:   1   Chronic Opioid Analgesic:  Oxycodone IR 5 mg, 1 tab p.o. every 4 hours (30 mg/day of oxycodone) MME/day: 45 mg/day.   Medications ordered for procedure: Meds ordered this encounter  Medications   iohexol (OMNIPAQUE) 180 MG/ML injection 10 mL    Must be Myelogram-compatible. If not available, you may substitute with a water-soluble, non-ionic, hypoallergenic, myelogram-compatible radiological contrast medium.   lidocaine (XYLOCAINE) 2 % (with pres) injection 400 mg    pentafluoroprop-tetrafluoroeth (GEBAUERS) aerosol   lactated ringers infusion   midazolam (VERSED) 5 MG/5ML injection 0.5-2 mg    Make sure Flumazenil is available in the pyxis when using this medication. If oversedation occurs, administer 0.2 mg IV over 15 sec. If after 45 sec no response, administer 0.2 mg again over 1 min; may repeat at 1 min intervals; not to exceed 4 doses (1 mg)   sodium chloride flush (NS) 0.9 % injection 2 mL   ropivacaine (PF) 2 mg/mL (0.2%) (NAROPIN) injection 2 mL   triamcinolone acetonide (KENALOG-40) injection 40 mg   Medications administered: We administered iohexol, lidocaine, lactated ringers, midazolam, sodium chloride flush, ropivacaine (PF) 2 mg/mL (0.2%), and triamcinolone acetonide.  See the medical record for exact dosing, route, and time of administration.  Follow-up plan:   Return in about 2 weeks (around 11/27/2021) for Proc-day (T,Th), (VV), (PPE).       Interventional Therapies  Risk  Complexity Considerations:   Estimated body mass index is 28.94 kg/m as calculated from the following:   Height as of this encounter: 5' 9"  (1.753 m).   Weight as of this encounter: 196 lb (88.9 kg). NOTE: EFFIENT Anticoagulation (Stop: 7-10 days  Re-start: 6 hrs)   Planned  Pending:   Diagnostic/therapeutic right caudal ESI #1  Palliative/therapeutic right lumbar facet RFA #4    Under consideration:   CYP2D6 / CYP3A4 genetic testing.   Completed:   Palliative right PSIS MNB/TPI x3 (11/13/2017) (100/100/100)  Diagnostic/palliative right lumbar facet MBB x2 (09/22/2018) (100/100/75/75)  Diagnostic/palliative left lumbar facet MBB x2 (09/22/2018) (100/100/75/75)  Palliative left lumbar facet RFA x3 (02/20/2021) (85/85/55/60-70)  Palliative right lumbar facet RFA x3 (01/09/2021) (100/95/50/60-70)    Therapeutic  Palliative (PRN) options:   Palliative right PSIS MNB/TPI  Diagnostic/palliative lumbar facet MBB  Palliative lumbar facet RFA      Recent  Visits Date Type Provider Dept  11/07/21 Office  Visit Milinda Pointer, MD [facility]  11/05/21 Office Visit Milinda Pointer, MD [facility]  10/01/21 Office Visit Milinda Pointer, MD [facility]  Showing recent visits within past 90 days and meeting all other requirements Today's Visits Date Type Provider Dept  11/13/21 Procedure visit Milinda Pointer, MD [facility]  Showing today's visits and meeting all other requirements Future Appointments Date Type Provider Dept  11/27/21 Appointment Milinda Pointer, MD [facility]  01/02/22 Appointment Milinda Pointer, MD [facility]  Showing future appointments within next 90 days and meeting all other requirements  Disposition: Discharge home  Discharge (Date  Time): 11/13/2021; 1245 hrs.   Primary Care Physician: [name], MD Location: [facility] Note by: Gaspar Cola, MD Date: 11/13/2021; Time: 1:37 PM  Disclaimer:  Medicine is not an Chief Strategy Officer. The only guarantee in medicine is that nothing is guaranteed. It is important to note that the decision to proceed with this intervention was based on the information collected from the patient. The Data and conclusions were drawn from the patient's questionnaire, the interview, and the physical examination. Because the information was provided in large part by the patient, it cannot be guaranteed that it has not been purposely or unconsciously manipulated. Every effort has been made to obtain as much relevant data as possible for this evaluation. It is important to note that the conclusions that lead to this procedure are derived in large part from the available data. Always take into account that the treatment will also be dependent on availability of resources and existing treatment guidelines, considered by other Pain Management Practitioners as being common knowledge and  practice, at the time of the intervention. For Medico-Legal purposes, it is also important to point out that variation in procedural techniques and pharmacological choices are the acceptable norm. The indications, contraindications, technique, and results of the above procedure should only be interpreted and judged by a Board-Certified Interventional Pain Specialist with extensive familiarity and expertise in the same exact procedure and technique.

## 2021-11-14 ENCOUNTER — Telehealth: Payer: Self-pay

## 2021-11-14 NOTE — Telephone Encounter (Signed)
Post procedure phone call.  Patient states he is doing ok.

## 2021-11-25 NOTE — Progress Notes (Unsigned)
Unsuccessful attempt to contact patient for Virtual Visit (Pain Management Telehealth)   Patient provided contact information:  [phone] (home); [phone] (mobile); (Preferred) [phone] [email or web address]   Pre-screening:  Our staff was successful in contacting Kenneth Osborn using the above provided information.   I unsuccessfully attempted to make contact with Kenneth Osborn on 3 different locations on 11/27/2021 via telephone. I was unable to complete the virtual encounter due to call going directly to voicemail. I was able to leave a message where I clearly identify myself as Gaspar Cola, MD and I left a message to call us back to reschedule the call.  The information shown below was obtained by the nursing staff during the precharting review of records with the patient.   Post-procedure evaluation   Procedure: Caudal Epidural Steroid Injection + Epidurogram   #1  Laterality: Midline aiming right Level: Sacrococcygeal ligament  Imaging: Fluoroscopy-guided         Anesthesia: Local anesthesia (1-2% Lidocaine) Anxiolysis: IV Versed 1.5 mg Sedation: No Sedation                       DOS: 11/13/2021  Performed by: Gaspar Cola, MD  Purpose: Diagnostic/Therapeutic Indications: Low back and lower extremity pain severe enough to impact quality of life or function. Rationale (medical necessity): procedure needed and proper for the diagnosis and/or treatment of Kenneth Osborn's medical symptoms and needs. 1. 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 Chronic lower extremity pain (Right)   3. Chronic Lumbar radicular pain (Right: S1)   4. DDD (degenerative disc disease), lumbosacral   5. Failed back surgical syndrome   6. Chronic anticoagulation (Effient)    NAS-11 Pain score:   Pre-procedure: 4 /10   Post-procedure: 0-No pain/10     Effectiveness:  Initial hour after procedure: 75 %. Subsequent 4-6 hours post-procedure: 75 %. Analgesia past  initial 6 hours: 75 % (for about a week then it gradulally returned.).  Pharmacotherapy Assessment  Analgesic: Oxycodone IR 5 mg, 1 tab p.o. every 4 hours (30 mg/day of oxycodone) MME/day: 45 mg/day.   Follow-up plan:   Reschedule Visit.     Interventional Therapies  Risk  Complexity Considerations:   Estimated body mass index is 28.94 kg/m as calculated from the following:   Height as of this encounter: 5' 9"  (1.753 m).   Weight as of this encounter: 196 lb (88.9 kg). NOTE: EFFIENT Anticoagulation (Stop: 7-10 days  Re-start: 6 hrs)   Planned  Pending:   Diagnostic/therapeutic right caudal ESI #1  Palliative/therapeutic right lumbar facet RFA #4    Under consideration:   CYP2D6 / CYP3A4 genetic testing.   Completed:   Palliative right PSIS MNB/TPI x3 (11/13/2017) (100/100/100)  Diagnostic/palliative right lumbar facet MBB x2 (09/22/2018) (100/100/75/75)  Diagnostic/palliative left lumbar facet MBB x2 (09/22/2018) (100/100/75/75)  Palliative left lumbar facet RFA x3 (02/20/2021) (85/85/55/60-70)  Palliative right lumbar facet RFA x3 (01/09/2021) (100/95/50/60-70)    Therapeutic  Palliative (PRN) options:   Palliative right PSIS MNB/TPI  Diagnostic/palliative lumbar facet MBB  Palliative lumbar facet RFA     Recent Visits Date Type Provider Dept  11/13/21 Procedure visit Milinda Pointer, MD [facility]  11/07/21 Office Visit Milinda Pointer, MD [facility]  11/05/21 Office Visit Milinda Pointer, MD [facility]  10/01/21 Office Visit Milinda Pointer, MD [facility]  Showing recent visits within past 90 days and meeting all other requirements Today's Visits Date Type Provider Dept  11/27/21 Office Visit Milinda Pointer, MD [facility]  Showing today's visits and meeting all other requirements Future Appointments Date Type Provider Dept  01/02/22 Appointment Milinda Pointer, MD [facility]  Showing  future appointments within next 90 days and meeting all other requirements   Note by: Gaspar Cola, MD Date: 11/27/2021; Time: 3:37 PM

## 2021-11-27 ENCOUNTER — Ambulatory Visit: Payer: Medicare Other | Attending: Pain Medicine | Admitting: Pain Medicine

## 2021-11-27 DIAGNOSIS — Z91199 Patient's noncompliance with other medical treatment and regimen due to unspecified reason: Secondary | ICD-10-CM

## 2021-12-01 NOTE — Progress Notes (Unsigned)
Patient: Kenneth Osborn  Service Category: E/M  Provider: Gaspar Cola, MD  DOB: 12-Jul-1955  DOS: 12/04/2021  Location: Office  MRN: [id number]  Setting: Ambulatory outpatient  Referring Provider: [name], MD  Type: Established Patient  Specialty: Interventional Pain Management  PCP: [name], MD  Location: Remote location  Delivery: TeleHealth     Virtual Encounter - Pain Management PROVIDER NOTE: Information contained herein reflects review and annotations entered in association with encounter. Interpretation of such information and data should be left to medically-trained personnel. Information provided to patient can be located elsewhere in the medical record under "Patient Instructions". Document created using STT-dictation technology, any transcriptional errors that may result from process are unintentional.    Contact & Pharmacy Preferred: [place]: [phone] (home) Mobile: [phone] (mobile) E-mail: [facility] [address] ([address]), [place]) [place] [address] Phone: [phone] Fax: [phone]  [facility] ([place], [facility] [address] [place] [address] [place] [address] Phone: [phone] Fax: [phone]   Pre-screening  Mr. Tosi offered "in-person" vs "virtual" encounter. He indicated preferring virtual for this encounter.   Reason COVID-19*  Social distancing based on CDC and AMA recommendations.   I contacted SAGAN MASELLI on 12/04/2021 via telephone.      I clearly identified myself as Gaspar Cola, MD. I verified that I was speaking with the correct person using two identifiers (Name: LENTON GENDREAU, and date of birth: Oct 26, 1955).  Consent I sought verbal advanced consent from [name] for virtual visit interactions. I informed Mr. Thune of possible security and privacy  concerns, risks, and limitations associated with providing "not-in-person" medical evaluation and management services. I also informed Mr. Fleek of the availability of "in-person" appointments. Finally, I informed him that there would be a charge for the virtual visit and that he could be  personally, fully or partially, financially responsible for it. Mr. Mercado expressed understanding and agreed to proceed.   Historic Elements   Mr. CRESCENCIO JOZWIAK is a 66 y.o. year old, male patient evaluated today after our last contact on 11/27/2021. Mr. Legler  has a past medical history of Acute postoperative pain (12/03/2018), Allergic rhinitis (12/30/2012), Anginal pain ([place]), Anxiety, Bronchitis, Can't get food down (08/12/2014), Cancer ([place]) (01/2020), Chronic back pain, Concussion (09/2015), Coronary artery disease, DDD (degenerative disc disease), cervical, Dehydration symptoms, Diabetes mellitus without complication ([place]), Dysphagia, GERD (gastroesophageal reflux disease), History of Meniere's disease (12/21/2014), History of thoracic spine surgery (S/P T9-10 IVD spacer) (12/21/2014), Hypercholesteremia, Hyperlipidemia, Hypertension, Meniere's disease, Myocardial infarction ([place]) (2012), Neuromuscular disorder ([place]), Renal disorder, Severe sepsis ([place]) (02/07/2020), Short-segment Barrett's esophagus, and Sleep apnea. He also  has a past surgical history that includes Cardiovascular stress test; Cardiac catheterization; Labrinthectomy; mastoid shunt (Bilateral); Back surgery; Shoulder arthroscopy with subacromial decompression (Left, 04/06/2012); ARTHRODESIS ANTERIOR ANTERIOR CERVICLE SPINE (01/04/2013); Colonoscopy with propofol (N/A, 09/19/2014); Esophagogastroduodenoscopy (N/A, 09/19/2014); Savory dilation (N/A, 09/19/2014); Spinal cord stimulator implant (Right); Cardiac catheterization (N/A, 06/15/2015); Cardiac catheterization (N/A, 06/15/2015); Pulse generator implant (N/A, 01/18/2019); Lumbar spinal cord simulator lead  removal (Right, 08/09/2019); IR Perc Cholecystostomy (02/09/2020); IR Radiologist Eval & Mgmt (03/14/2020); Coronary angioplasty; Appendectomy; Robot assisted laparoscopic nephrectomy (Left, 04/20/2020); and Colonoscopy with propofol (N/A, 04/03/2021). Mr. Haberland has a current medication list which includes the following prescription(s): alprazolam, aspirin ec, cetirizine, cyclobenzaprine, diltiazem, esomeprazole, ezetimibe, gabapentin, glucagon, humulin r u-500 kwikpen, isosorbide mononitrate, magnesium  oxide, meclizine, metformin, metoprolol succinate, montelukast, movantik, niacin, nitroglycerin, oxycodone, oxycodone, [START ON 12/06/2021] oxycodone, polyethylene glycol, prasugrel, rosuvastatin, and senna-docusate. He  reports that he has never smoked. He has never used smokeless tobacco. He reports that he does not drink alcohol and does not use drugs. Mr. Colgate has No Known Allergies.   HPI  Today, he is being contacted for a post-procedure assessment.  Post-procedure evaluation   Procedure: Caudal Epidural Steroid Injection + Epidurogram   #1  Laterality: Midline aiming right Level: Sacrococcygeal ligament  Imaging: Fluoroscopy-guided         Anesthesia: Local anesthesia (1-2% Lidocaine) Anxiolysis: IV Versed 1.5 mg Sedation: No Sedation                       DOS: 11/13/2021  Performed by: Gaspar Cola, MD  Purpose: Diagnostic/Therapeutic Indications: Low back and lower extremity pain severe enough to impact quality of life or function. Rationale (medical necessity): procedure needed and proper for the diagnosis and/or treatment of Mr. Frenette's medical symptoms and needs. 1. 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 Chronic lower extremity pain (Right)   3. Chronic Lumbar radicular pain (Right: S1)   4. DDD (degenerative disc disease), lumbosacral   5. Failed back surgical syndrome   6. Chronic anticoagulation (Effient)    NAS-11 Pain score:   Pre-procedure: 4 /10    Post-procedure: 0-No pain/10      Effectiveness:  Initial hour after procedure:   ***. Subsequent 4-6 hours post-procedure:   ***. Analgesia past initial 6 hours:   ***. Ongoing improvement:  Analgesic:  *** Function:    ***    ROM:    ***     Pharmacotherapy Assessment   Opioid Analgesic: Oxycodone IR 5 mg, 1 tab p.o. every 4 hours (30 mg/day of oxycodone) MME/day: 45 mg/day.   Monitoring: [place] PMP: PDMP not reviewed this encounter.       Pharmacotherapy: No side-effects or adverse reactions reported. Compliance: No problems identified. Effectiveness: Clinically acceptable. Plan: Refer to "POC". UDS:  Summary  Date Value Ref Range Status  06/27/2021 Note  Final    Comment:    ==================================================================== [employer] [facility] 13 (MW) ==================================================================== Test                             Result       Flag       Units  Drug Present and Declared for Prescription Verification   Desmethyldiazepam              222          EXPECTED   ng/mg creat   Oxazepam                       665          EXPECTED   ng/mg creat   Temazepam                      411          EXPECTED   ng/mg creat    Desmethyldiazepam, oxazepam, and temazepam are expected metabolites    of diazepam. Desmethyldiazepam and oxazepam are also expected    metabolites of other drugs, including chlordiazepoxide, prazepam,    clorazepate, and halazepam. Oxazepam is an expected metabolite of    temazepam. Oxazepam and temazepam are also available as scheduled  prescription medications.    Oxycodone                      371          EXPECTED   ng/mg creat   Oxymorphone                    1345         EXPECTED   ng/mg creat   Noroxycodone                   511          EXPECTED   ng/mg creat   Noroxymorphone                 320          EXPECTED   ng/mg creat    Sources of oxycodone are scheduled prescription medications.     Oxymorphone, noroxycodone, and noroxymorphone are expected    metabolites of oxycodone. Oxymorphone is also available as a    scheduled prescription medication.  Drug Absent but Declared for Prescription Verification   Alprazolam                     Not Detected UNEXPECTED ng/mg creat ==================================================================== Test                      Result    Flag   Units      Ref Range   Creatinine              65               mg/dL      >=20 ==================================================================== Declared Medications:  The flagging and interpretation on this report are based on the  following declared medications.  Unexpected results may arise from  inaccuracies in the declared medications.   **Note: The testing scope of this panel includes these medications:   Alprazolam (Xanax)  Diazepam (Valium)  Oxycodone (Roxicodone)   **Note: The testing scope of this panel does not include the  following reported medications:   Aspirin  Cetirizine (Zyrtec)  Cyclobenzaprine (Flexeril)  Diltiazem (Cardizem)  Docusate (Senokot-S)  Enalapril (Vasotec)  Esomeprazole (Nexium)  Ezetimibe (Zetia)  Gabapentin (Neurontin)  Glucagon  Insulin (Humulin)  Isosorbide (Imdur)  Magnesium (Mag-Ox)  Meclizine (Antivert)  Metformin (Glucophage)  Metoprolol (Toprol)  Montelukast (Singulair)  Naloxegol (Movantik)  Niacin  Nitroglycerin (Nitrostat)  Polyethylene Glycol (MiraLAX)  Prasugrel (Effient)  Rosuvastatin (Crestor)  Sennosides (Senokot-S) ==================================================================== For clinical consultation, please call [phone]. ====================================================================    No results found for: "CBDTHCR", "D8THCCBX", "D9THCCBX"   Laboratory Chemistry Profile   Renal Lab Results  Component Value Date   BUN 37 (H) 11/02/2021   CREATININE 2.18 (H) 11/02/2021   GFRAA 58 (L)  01/08/2019   GFRNONAA 32 (L) 11/02/2021    Hepatic Lab Results  Component Value Date   AST 33 11/01/2021   ALT 31 11/01/2021   ALBUMIN 4.0 11/01/2021   ALKPHOS 79 11/01/2021   HCVAB NON REACTIVE 02/08/2020   LIPASE 28 12/11/2020   AMMONIA 24 02/07/2020    Electrolytes Lab Results  Component Value Date   NA 138 11/02/2021   K 5.9 (H) 11/02/2021   CL 107 11/02/2021   CALCIUM 9.8 11/02/2021   MG 1.6 (L) 11/02/2021   PHOS 3.1 11/09/2020    Bone No results found for: "VD25OH", "VD125OH2TOT", "ON6295MW4", "XL2440NU2", "25OHVITD1", "25OHVITD2", "25OHVITD3", "  TESTOFREE", "TESTOSTERONE"  Inflammation (CRP: Acute Phase) (ESR: Chronic Phase) Lab Results  Component Value Date   CRP 1.4 (H) 03/22/2015   ESRSEDRATE 9 03/22/2015   LATICACIDVEN 1.5 12/11/2020         Note: Above Lab results reviewed.  Imaging  DG PAIN CLINIC C-ARM 1-60 MIN NO REPORT Fluoro was used, but no Radiologist interpretation will be provided.  Please refer to "NOTES" tab for provider progress note.  Assessment  There were no encounter diagnoses.  Plan of Care  Problem-specific:  No problem-specific Assessment & Plan notes found for this encounter.  Mr. CATHY ROPP has a current medication list which includes the following long-term medication(s): cetirizine, cyclobenzaprine, esomeprazole, ezetimibe, gabapentin, humulin r u-500 kwikpen, metoprolol succinate, montelukast, niacin, oxycodone, oxycodone, and [START ON 12/06/2021] oxycodone.  Pharmacotherapy (Medications Ordered): No orders of the defined types were placed in this encounter.  Orders:  No orders of the defined types were placed in this encounter.  Follow-up plan:   No follow-ups on file.     Interventional Therapies  Risk  Complexity Considerations:   Estimated body mass index is 28.94 kg/m as calculated from the following:   Height as of this encounter: _0  (1.753 m).   Weight as of this encounter: 196 lb (88.9 kg). NOTE:  EFFIENT Anticoagulation (Stop: 7-10 days  Re-start: 6 hrs)   Planned  Pending:   Diagnostic/therapeutic right caudal ESI #1  Palliative/therapeutic right lumbar facet RFA #4    Under consideration:   CYP2D6 / CYP3A4 genetic testing.   Completed:   Palliative right PSIS MNB/TPI x3 (11/13/2017) (100/100/100)  Diagnostic/palliative right lumbar facet MBB x2 (09/22/2018) (100/100/75/75)  Diagnostic/palliative left lumbar facet MBB x2 (09/22/2018) (100/100/75/75)  Palliative left lumbar facet RFA x3 (02/20/2021) (85/85/55/60-70)  Palliative right lumbar facet RFA x3 (01/09/2021) (100/95/50/60-70)    Therapeutic  Palliative (PRN) options:   Palliative right PSIS MNB/TPI  Diagnostic/palliative lumbar facet MBB  Palliative lumbar facet RFA      Recent Visits Date Type Provider Dept  11/27/21 Office Visit Milinda Pointer, MD [facility]  11/13/21 Procedure visit Milinda Pointer, MD [facility]  11/07/21 Office Visit Milinda Pointer, MD [facility]  11/05/21 Office Visit Milinda Pointer, MD [facility]  10/01/21 Office Visit Milinda Pointer, MD [facility]  Showing recent visits within past 90 days and meeting all other requirements Future Appointments Date Type Provider Dept  12/04/21 Appointment Milinda Pointer, MD [facility]  01/02/22 Appointment Milinda Pointer, MD [facility]  Showing future appointments within next 90 days and meeting all other requirements  I discussed the assessment and treatment plan with the patient. The patient was provided an opportunity to ask questions and all were answered. The patient agreed with the plan and demonstrated an understanding of the instructions.  Patient advised to call back or seek an in-person evaluation if the symptoms or condition worsens.  Duration of encounter: *** minutes.  Note by: Gaspar Cola, MD Date: 12/04/2021; Time: 9:32 AM

## 2021-12-04 ENCOUNTER — Ambulatory Visit: Payer: Medicare Other | Attending: Pain Medicine | Admitting: Pain Medicine

## 2021-12-04 DIAGNOSIS — M5410 Radiculopathy, site unspecified: Secondary | ICD-10-CM | POA: Diagnosis not present

## 2021-12-04 DIAGNOSIS — M961 Postlaminectomy syndrome, not elsewhere classified: Secondary | ICD-10-CM

## 2021-12-04 DIAGNOSIS — M5137 Other intervertebral disc degeneration, lumbosacral region: Secondary | ICD-10-CM | POA: Diagnosis not present

## 2021-12-04 DIAGNOSIS — M79604 Pain in right leg: Secondary | ICD-10-CM | POA: Diagnosis not present

## 2021-12-04 DIAGNOSIS — M5441 Lumbago with sciatica, right side: Secondary | ICD-10-CM | POA: Diagnosis not present

## 2021-12-04 DIAGNOSIS — G96198 Other disorders of meninges, not elsewhere classified: Secondary | ICD-10-CM

## 2021-12-04 DIAGNOSIS — Z7901 Long term (current) use of anticoagulants: Secondary | ICD-10-CM

## 2021-12-04 DIAGNOSIS — G8929 Other chronic pain: Secondary | ICD-10-CM

## 2021-12-04 NOTE — Patient Instructions (Signed)
______________________________________________________________________  Preparing for Procedure with Sedation  NOTICE: Due to recent regulatory changes, starting on September 18, 2020, procedures requiring intravenous (IV) sedation will no longer be performed at the [facility].  These types of procedures are required to be performed at [facility] ambulatory surgery facility.  We are very sorry for the inconvenience.  Procedure appointments are limited to planned procedures: No Prescription Refills. No disability issues will be discussed. No medication changes will be discussed.  Instructions: Oral Intake: Do not eat or drink anything for at least 8 hours prior to your procedure. (Exception: Blood Pressure Medication. See below.) Transportation: A driver is required. You may not drive yourself after the procedure. Blood Pressure Medicine: Do not forget to take your blood pressure medicine with a sip of water the morning of the procedure. If your Diastolic (lower reading) is above 100 mmHg, elective cases will be cancelled/rescheduled. Blood thinners: These will need to be stopped for procedures. Notify our staff if you are taking any blood thinners. Depending on which one you take, there will be specific instructions on how and when to stop it. Diabetics on insulin: Notify the staff so that you can be scheduled 1st case in the morning. If your diabetes requires high dose insulin, take only  of your normal insulin dose the morning of the procedure and notify the staff that you have done so. Preventing infections: Shower with an antibacterial soap the morning of your procedure. Build-up your immune system: Take 1000 mg of Vitamin C with every meal (3 times a day) the day prior to your procedure. Antibiotics: Inform the staff if you have a condition or reason that requires you to take antibiotics before dental procedures. Pregnancy: If you are pregnant, call and cancel the procedure. Sickness: If  you have a cold, fever, or any active infections, call and cancel the procedure. Arrival: You must be in the facility at least 30 minutes prior to your scheduled procedure. Children: Do not bring children with you. Dress appropriately: There is always the possibility that your clothing may get soiled. Valuables: Do not bring any jewelry or valuables.  Reasons to call and reschedule or cancel your procedure: (Following these recommendations will minimize the risk of a serious complication.) Surgeries: Avoid having procedures within 2 weeks of any surgery. (Avoid for 2 weeks before or after any surgery). Flu Shots: Avoid having procedures within 2 weeks of a flu shots. (Avoid for 2 weeks before or after immunizations). Barium: Avoid having a procedure within 7-10 days after having had a radiological study involving the use of radiological contrast. (Myelograms, Barium swallow or enema study). Heart attacks: Avoid any elective procedures or surgeries for the initial 6 months after a "Myocardial Infarction" (Heart Attack). Blood thinners: It is imperative that you stop these medications before procedures. Let us know if you if you take any blood thinner.  Infection: Avoid procedures during or within two weeks of an infection (including chest colds or gastrointestinal problems). Symptoms associated with infections include: Localized redness, fever, chills, night sweats or profuse sweating, burning sensation when voiding, cough, congestion, stuffiness, runny nose, sore throat, diarrhea, nausea, vomiting, cold or Flu symptoms, recent or current infections. It is specially important if the infection is over the area that we intend to treat. Heart and lung problems: Symptoms that may suggest an active cardiopulmonary problem include: cough, chest pain, breathing difficulties or shortness of breath, dizziness, ankle swelling, uncontrolled high or unusually low blood pressure, and/or palpitations. If you are  experiencing any of these symptoms, cancel your procedure and contact your primary care physician for an evaluation.  Remember:  Regular Business hours are:  Monday to Thursday 8:00 AM to 4:00 PM  Provider's Schedule: Milinda Pointer, MD:  Procedure days: Tuesday and Thursday 7:30 AM to 4:00 PM  Gillis Santa, MD:  Procedure days: Monday and Wednesday 7:30 AM to 4:00 PM ______________________________________________________________________  ____________________________________________________________________________________________  General Risks and Possible Complications  Patient Responsibilities: It is important that you read this as it is part of your informed consent. It is our duty to inform you of the risks and possible complications associated with treatments offered to you. It is your responsibility as a patient to read this and to ask questions about anything that is not clear or that you believe was not covered in this document.  Patient's Rights: You have the right to refuse treatment. You also have the right to change your mind, even after initially having agreed to have the treatment done. However, under this last option, if you wait until the last second to change your mind, you may be charged for the materials used up to that point.  Introduction: Medicine is not an Chief Strategy Officer. Everything in Medicine, including the lack of treatment(s), carries the potential for danger, harm, or loss (which is by definition: Risk). In Medicine, a complication is a secondary problem, condition, or disease that can aggravate an already existing one. All treatments carry the risk of possible complications. The fact that a side effects or complications occurs, does not imply that the treatment was conducted incorrectly. It must be clearly understood that these can happen even when everything is done following the highest safety standards.  No treatment: You can choose not to proceed with the  proposed treatment alternative. The "PRO(s)" would include: avoiding the risk of complications associated with the therapy. The "CON(s)" would include: not getting any of the treatment benefits. These benefits fall under one of three categories: diagnostic; therapeutic; and/or palliative. Diagnostic benefits include: getting information which can ultimately lead to improvement of the disease or symptom(s). Therapeutic benefits are those associated with the successful treatment of the disease. Finally, palliative benefits are those related to the decrease of the primary symptoms, without necessarily curing the condition (example: decreasing the pain from a flare-up of a chronic condition, such as incurable terminal cancer).  General Risks and Complications: These are associated to most interventional treatments. They can occur alone, or in combination. They fall under one of the following six (6) categories: no benefit or worsening of symptoms; bleeding; infection; nerve damage; allergic reactions; and/or death. No benefits or worsening of symptoms: In Medicine there are no guarantees, only probabilities. No healthcare provider can ever guarantee that a medical treatment will work, they can only state the probability that it may. Furthermore, there is always the possibility that the condition may worsen, either directly, or indirectly, as a consequence of the treatment. Bleeding: This is more common if the patient is taking a blood thinner, either prescription or over the counter (example: Goody Powders, Fish oil, Aspirin, Garlic, etc.), or if suffering a condition associated with impaired coagulation (example: Hemophilia, cirrhosis of the liver, low platelet counts, etc.). However, even if you do not have one on these, it can still happen. If you have any of these conditions, or take one of these drugs, make sure to notify your treating physician. Infection: This is more common in patients with a compromised  immune system, either due to disease (example:  diabetes, cancer, human immunodeficiency virus [HIV], etc.), or due to medications or treatments (example: therapies used to treat cancer and rheumatological diseases). However, even if you do not have one on these, it can still happen. If you have any of these conditions, or take one of these drugs, make sure to notify your treating physician. Nerve Damage: This is more common when the treatment is an invasive one, but it can also happen with the use of medications, such as those used in the treatment of cancer. The damage can occur to small secondary nerves, or to large primary ones, such as those in the spinal cord and brain. This damage may be temporary or permanent and it may lead to impairments that can range from temporary numbness to permanent paralysis and/or brain death. Allergic Reactions: Any time a substance or material comes in contact with our body, there is the possibility of an allergic reaction. These can range from a mild skin rash (contact dermatitis) to a severe systemic reaction (anaphylactic reaction), which can result in death. Death: In general, any medical intervention can result in death, most of the time due to an unforeseen complication. ____________________________________________________________________________________________ ____________________________________________________________________________________________  Blood Thinners  IMPORTANT NOTICE:  If you take any of these, make sure to notify the nursing staff.  Failure to do so may result in injury.  Recommended time intervals to stop and restart blood-thinners, before & after invasive procedures  Generic Name Brand Name Pre-procedure. Stop this long before procedure. Post-procedure. Minimum waiting period before restarting.  Abciximab Reopro 15 days 2 hrs  Alteplase Activase 10 days 10 days  Anagrelide Agrylin    Apixaban Eliquis 3 days 6 hrs  Cilostazol Pletal  3 days 5 hrs  Clopidogrel Plavix 7-10 days 2 hrs  Dabigatran Pradaxa 5 days 6 hrs  Dalteparin Fragmin 24 hours 4 hrs  Dipyridamole Aggrenox 11days 2 hrs  Edoxaban Lixiana; Savaysa 3 days 2 hrs  Enoxaparin  Lovenox 24 hours 4 hrs  Eptifibatide Integrillin 8 hours 2 hrs  Fondaparinux  Arixtra 72 hours 12 hrs  Hydroxychloroquine Plaquenil 11 days   Prasugrel Effient 7-10 days 6 hrs  Reteplase Retavase 10 days 10 days  Rivaroxaban Xarelto 3 days 6 hrs  Ticagrelor Brilinta 5-7 days 6 hrs  Ticlopidine Ticlid 10-14 days 2 hrs  Tinzaparin Innohep 24 hours 4 hrs  Tirofiban Aggrastat 8 hours 2 hrs  Warfarin Coumadin 5 days 2 hrs   Other medications with blood-thinning effects  Product indications Generic (Brand) names Note  Cholesterol Lipitor Stop 4 days before procedure  Blood thinner (injectable) Heparin (LMW or LMWH Heparin) Stop 24 hours before procedure  Cancer Ibrutinib (Imbruvica) Stop 7 days before procedure  Malaria/Rheumatoid Hydroxychloroquine (Plaquenil) Stop 11 days before procedure  Thrombolytics  10 days before or after procedures   Over-the-counter (OTC) Products with blood-thinning effects  Product Common names Stop Time  Aspirin > 325 mg Goody Powders, Excedrin, etc. 11 days  Aspirin ? 81 mg  7 days  Fish oil  4 days  Garlic supplements  7 days  Ginkgo biloba  36 hours  Ginseng  24 hours  NSAIDs Ibuprofen, Naprosyn, etc. 3 days  Vitamin E  4 days   ____________________________________________________________________________________________

## 2021-12-05 ENCOUNTER — Telehealth: Payer: Self-pay

## 2021-12-05 NOTE — Telephone Encounter (Signed)
Kenneth Osborn, this patient is having a RACZ procedure on 12-18-2021.  I will sent the orders down ahead of time.  Thanks.  If you have any questions, please call the pain clinic at [phone]

## 2021-12-18 ENCOUNTER — Ambulatory Visit: Payer: Medicare Other | Attending: Pain Medicine | Admitting: Pain Medicine

## 2021-12-18 ENCOUNTER — Encounter: Payer: Self-pay | Admitting: Pain Medicine

## 2021-12-18 ENCOUNTER — Ambulatory Visit
Admission: RE | Admit: 2021-12-18 | Discharge: 2021-12-18 | Disposition: A | Discharge status: Home / Self Care | Payer: Medicare Other | Source: Ambulatory Visit | Attending: Pain Medicine | Admitting: Pain Medicine

## 2021-12-18 VITALS — BP 145/72 | HR 76 | Temp 97.3°F | Resp 12 | Ht 69.0 in | Wt 220.0 lb

## 2021-12-18 DIAGNOSIS — G8929 Other chronic pain: Secondary | ICD-10-CM | POA: Diagnosis present

## 2021-12-18 DIAGNOSIS — Z7901 Long term (current) use of anticoagulants: Secondary | ICD-10-CM | POA: Insufficient documentation

## 2021-12-18 DIAGNOSIS — M5410 Radiculopathy, site unspecified: Secondary | ICD-10-CM | POA: Diagnosis present

## 2021-12-18 DIAGNOSIS — M5137 Other intervertebral disc degeneration, lumbosacral region: Secondary | ICD-10-CM | POA: Diagnosis present

## 2021-12-18 DIAGNOSIS — M5441 Lumbago with sciatica, right side: Secondary | ICD-10-CM | POA: Diagnosis present

## 2021-12-18 DIAGNOSIS — G96198 Other disorders of meninges, not elsewhere classified: Secondary | ICD-10-CM | POA: Insufficient documentation

## 2021-12-18 DIAGNOSIS — M961 Postlaminectomy syndrome, not elsewhere classified: Secondary | ICD-10-CM | POA: Diagnosis present

## 2021-12-18 DIAGNOSIS — M79604 Pain in right leg: Secondary | ICD-10-CM | POA: Insufficient documentation

## 2021-12-18 MED ORDER — MIDAZOLAM HCL 5 MG/5ML IJ SOLN
0.5000 mg | Freq: Once | INTRAMUSCULAR | Status: AC
  Administered 2021-12-18: 2 mg via INTRAVENOUS

## 2021-12-18 MED ORDER — PENTAFLUOROPROP-TETRAFLUOROETH EX AERO
INHALATION_SPRAY | Freq: Once | CUTANEOUS | Status: AC
  Administered 2021-12-18: 30 via TOPICAL

## 2021-12-18 MED ORDER — LIDOCAINE-EPINEPHRINE (PF) 2 %-1:200000 IJ SOLN
10.0000 mL | Freq: Once | INTRAMUSCULAR | Status: AC
  Administered 2021-12-18: 10 mL

## 2021-12-18 MED ORDER — CEFAZOLIN SODIUM 1 G IJ SOLR
INTRAMUSCULAR | Status: AC
  Filled 2021-12-18: qty 10

## 2021-12-18 MED ORDER — IOHEXOL 180 MG/ML  SOLN
10.0000 mL | Freq: Once | INTRAMUSCULAR | Status: AC
  Administered 2021-12-18: 10 mL via EPIDURAL

## 2021-12-18 MED ORDER — SODIUM CHLORIDE 0.9% FLUSH
4.0000 mL | Freq: Once | INTRAVENOUS | Status: AC
  Administered 2021-12-18: 4 mL

## 2021-12-18 MED ORDER — LACTATED RINGERS IV SOLN: Freq: Once | INTRAVENOUS | Status: AC

## 2021-12-18 MED ORDER — FENTANYL CITRATE (PF) 100 MCG/2ML IJ SOLN
INTRAMUSCULAR | Status: AC
  Filled 2021-12-18: qty 2

## 2021-12-18 MED ORDER — FENTANYL CITRATE (PF) 100 MCG/2ML IJ SOLN
25.0000 ug | INTRAMUSCULAR | Status: DC | PRN
  Administered 2021-12-18: 50 ug via INTRAVENOUS

## 2021-12-18 MED ORDER — STERILE WATER FOR INJECTION IJ SOLN
10.0000 mL | Freq: Once | INTRAVENOUS | Status: AC
  Administered 2021-12-18: 10 mL via EPIDURAL
  Filled 2021-12-18: qty 4.27

## 2021-12-18 MED ORDER — TRIAMCINOLONE ACETONIDE 40 MG/ML IJ SUSP
INTRAMUSCULAR | Status: AC
  Filled 2021-12-18: qty 1

## 2021-12-18 MED ORDER — ROPIVACAINE HCL 2 MG/ML IJ SOLN
4.0000 mL | Freq: Once | INTRAMUSCULAR | Status: AC
  Administered 2021-12-18: 4 mL via EPIDURAL

## 2021-12-18 MED ORDER — SODIUM CHLORIDE (PF) 0.9 % IJ SOLN
INTRAMUSCULAR | Status: AC
  Filled 2021-12-18: qty 10

## 2021-12-18 MED ORDER — CEFAZOLIN SODIUM-DEXTROSE 1-4 GM/50ML-% IV SOLN
1.0000 g | Freq: Once | INTRAVENOUS | Status: AC
  Administered 2021-12-18: 1 g via INTRAVENOUS

## 2021-12-18 MED ORDER — LIDOCAINE HCL (PF) 2 % IJ SOLN: 10.0000 mL | Freq: Once | INTRAMUSCULAR | Status: DC

## 2021-12-18 MED ORDER — LIDOCAINE HCL 2 % IJ SOLN
20.0000 mL | Freq: Once | INTRAMUSCULAR | Status: AC
  Administered 2021-12-18: 400 mg

## 2021-12-18 MED ORDER — MIDAZOLAM HCL 5 MG/5ML IJ SOLN
INTRAMUSCULAR | Status: AC
  Filled 2021-12-18: qty 5

## 2021-12-18 MED ORDER — TRIAMCINOLONE ACETONIDE 40 MG/ML IJ SUSP
40.0000 mg | Freq: Once | INTRAMUSCULAR | Status: AC
  Administered 2021-12-18: 40 mg

## 2021-12-18 MED ORDER — LIDOCAINE HCL 2 % IJ SOLN
INTRAMUSCULAR | Status: AC
  Filled 2021-12-18: qty 20

## 2021-12-18 MED ORDER — ROPIVACAINE HCL 2 MG/ML IJ SOLN
INTRAMUSCULAR | Status: AC
  Filled 2021-12-18: qty 20

## 2021-12-18 MED ORDER — HYALURONIDASE HUMAN 150 UNIT/ML IJ SOLN
1500.0000 [IU] | Freq: Once | INTRAMUSCULAR | Status: AC
  Administered 2021-12-18: 1500 [IU] via INTRADERMAL
  Filled 2021-12-18 (×2): qty 10

## 2021-12-18 NOTE — Progress Notes (Addendum)
0900 3ML OF CONTRAST USED  0902 LIDOCAINE WITH EPI GIVEN  0906 HYLENEX 1500 UNITS GIVEN

## 2021-12-18 NOTE — Progress Notes (Signed)
0923:  Pt arrived to the recovery room and was given Triamcinolone 34m, Ropivacaine 439mand 0.9% PF NSS               via of catheter by Dr. NaDossie ArbourTolerated well.  0945:  He was placed on 2 liters of 02 for comfort measures.  1000:   Pt was given 10% of Hypertonic saline slowly via catheter by Dr. NaLowella DandyHis catheter was removed and intact.              He was given 45m645mersed via IV for pain.  1030:    IV was removed and pt ready for discharge. He voided before discharge.

## 2021-12-18 NOTE — Progress Notes (Signed)
PROVIDER NOTE: Interpretation of information contained herein should be left to medically-trained personnel. Specific patient instructions are provided elsewhere under "Patient Instructions" section of medical record. This document was created in part using STT-dictation technology, any transcriptional errors that may result from this process are unintentional.  Patient: Kenneth Osborn Type: Established DOB: [birth year] MRN: [id number] PCP: [name], MD  Service: Procedure DOS: 12/18/2021 Setting: Ambulatory Location: Ambulatory outpatient facility Delivery: Face-to-face Provider: Gaspar Cola, MD Specialty: Interventional Pain Management Specialty designation: 09 Location: Outpatient facility Ref. Prov.: [name], MD    Primary Reason for Visit: Interventional Pain Management Treatment. CC: Back Pain (lower)    Procedure:          Anesthesia, Analgesia, Anxiolysis:  Type: Therapeutic Percutaneous Epidural Neuroplasty and Lysis of Adhesions (RACZ Procedure)  #1  Region: Caudal Level: Sacrococcygeal   Laterality: Midline aiming at the right  Anesthesia: Local (1-2% Lidocaine)  Anxiolysis: None  Sedation: None  Guidance: Fluoroscopy           Position: Prone   1. Failed back surgical syndrome   2. Epidural fibrosis   3. DDD (degenerative disc disease), lumbosacral   4. 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 Chronic lower extremity pain (Right)   6. Chronic Lumbar radicular pain (Right: S1)   7. Chronic anticoagulation (Effient)    NAS-11 Pain score:   Pre-procedure: 3 /10   Post-procedure: 0-No pain/10     Pre-op H&P Assessment:  Kenneth Osborn is a 66 y.o. (year old), male patient, seen today for interventional treatment. He  has a past surgical history that includes Cardiovascular stress test; Cardiac catheterization; Labrinthectomy; mastoid shunt (Bilateral); Back surgery; Shoulder arthroscopy with subacromial decompression (Left, 04/06/2012);  ARTHRODESIS ANTERIOR ANTERIOR CERVICLE SPINE (01/04/2013); Colonoscopy with propofol (N/A, 09/19/2014); Esophagogastroduodenoscopy (N/A, 09/19/2014); Savory dilation (N/A, 09/19/2014); Spinal cord stimulator implant (Right); Cardiac catheterization (N/A, 06/15/2015); Cardiac catheterization (N/A, 06/15/2015); Pulse generator implant (N/A, 01/18/2019); Lumbar spinal cord simulator lead removal (Right, 08/09/2019); IR Perc Cholecystostomy (02/09/2020); IR Radiologist Eval & Mgmt (03/14/2020); Coronary angioplasty; Appendectomy; Robot assisted laparoscopic nephrectomy (Left, 04/20/2020); and Colonoscopy with propofol (N/A, 04/03/2021). [name] has a current medication list which includes the following prescription(s): alprazolam, aspirin ec, cetirizine, cyclobenzaprine, diltiazem, esomeprazole, ezetimibe, gabapentin, glucagon, humulin r u-500 kwikpen, isosorbide mononitrate, magnesium oxide, meclizine, metformin, metoprolol succinate, montelukast, movantik, niacin, nitroglycerin, oxycodone, polyethylene glycol, prasugrel, rosuvastatin, senna-docusate, oxycodone, and oxycodone, and the following Facility-Administered Medications: fentanyl, lidocaine hcl (pf), midazolam, and sodium chloride hypertonic 10% epidural injection. His primarily concern today is the Back Pain (lower)  Initial Vital Signs:  Pulse/HCG Rate: 76ECG Heart Rate: 80 (NSR) Temp: (!) 97.2 F (36.2 C) Resp: 16 BP: 127/80 SpO2: 98 %  BMI: Estimated body mass index is 32.49 kg/m as calculated from the following:   Height as of this encounter: _0  (1.753 m).   Weight as of this encounter: 220 lb (99.8 kg).  Risk Assessment: Allergies: Reviewed. He has No Known Allergies.  Allergy Precautions: None required Coagulopathies: Reviewed. None identified.  Blood-thinner therapy: None at this time Active Infection(s): Reviewed. None identified. [name] is afebrile  Site Confirmation: [name] was asked to confirm the procedure and laterality before  marking the site Procedure checklist: Completed Consent: Before the procedure and under the influence of no sedative(s), amnesic(s), or anxiolytics, the patient was informed of the treatment options, risks and possible complications. To fulfill our ethical and legal obligations, as recommended by the American Medical Association's Code of  Ethics, I have informed the patient of my clinical impression; the nature and purpose of the treatment or procedure; the risks, benefits, and possible complications of the intervention; the alternatives, including doing nothing; the risk(s) and benefit(s) of the alternative treatment(s) or procedure(s); and the risk(s) and benefit(s) of doing nothing. The patient was provided information about the general risks and possible complications associated with the procedure. These may include, but are not limited to: failure to achieve desired goals, infection, bleeding, organ or nerve damage, allergic reactions, paralysis, and death. In addition, the patient was informed of those risks and complications associated to Spine-related procedures, such as failure to decrease pain; infection (i.e.: Meningitis, epidural or intraspinal abscess); bleeding (i.e.: epidural hematoma, subarachnoid hemorrhage, or any other type of intraspinal or peri-dural bleeding); organ or nerve damage (i.e.: Any type of peripheral nerve, nerve root, or spinal cord injury) with subsequent damage to sensory, motor, and/or autonomic systems, resulting in permanent pain, numbness, and/or weakness of one or several areas of the body; allergic reactions; (i.e.: anaphylactic reaction); and/or death. Furthermore, the patient was informed of those risks and complications associated with the medications. These include, but are not limited to: allergic reactions (i.e.: anaphylactic or anaphylactoid reaction(s)); adrenal axis suppression; blood sugar elevation that in diabetics may result in ketoacidosis or comma; water  retention that in patients with history of congestive heart failure may result in shortness of breath, pulmonary edema, and decompensation with resultant heart failure; weight gain; swelling or edema; medication-induced neural toxicity; particulate matter embolism and blood vessel occlusion with resultant organ, and/or nervous system infarction; and/or aseptic necrosis of one or more joints. Finally, the patient was informed that Medicine is not an exact science; therefore, there is also the possibility of unforeseen or unpredictable risks and/or possible complications that may result in a catastrophic outcome. The patient indicated having understood very clearly. We have given the patient no guarantees and we have made no promises. Enough time was given to the patient to ask questions, all of which were answered to the patient's satisfaction. Mr. Sequeira has indicated that he wanted to continue with the procedure. Attestation: I, the ordering provider, attest that I have discussed with the patient the benefits, risks, side-effects, alternatives, likelihood of achieving goals, and potential problems during recovery for the procedure that I have provided informed consent. Date  Time: 12/18/2021  8:12 AM  Pre-Procedure Preparation:  Monitoring: As per clinic protocol. Respiration, ETCO2, SpO2, BP, heart rate and rhythm monitor placed and checked for adequate function Safety Precautions: Patient was assessed for positional comfort and pressure points before starting the procedure. Time-out: I initiated and conducted the "Time-out" before starting the procedure, as per protocol. The patient was asked to participate by confirming the accuracy of the "Time Out" information. Verification of the correct person, site, and procedure were performed and confirmed by me, the nursing staff, and the patient. "Time-out" conducted as per Joint Commission's Universal Protocol (UP.01.01.01). Time: 52  Description of  Procedure:          Target Area: Caudal Epidural Canal Approach: Midline approach Area Prepped: Entire Posterior Sacrococcygeal Region DuraPrep (Iodine Povacrylex [0.7% available iodine] and Isopropyl Alcohol, 74% w/w) Safety Precautions: Aspiration looking for blood return was conducted prior to all injections. At no point did I inject any substances, as a needle was being advanced. No attempts were made at seeking any paresthesias. Safe injection practices and needle disposal techniques used. Medications properly checked for expiration dates. SDV (single dose vial) medications used.  Description of the Procedure: Protocol guidelines were followed. The patient was placed in position over the fluoroscopy table. Patient assessed for comfort and pressure points before starting procedure. The target area was identified and the area prepped in the usual manner. Skin & deeper tissues infiltrated with local anesthetic. Appropriate amount of time allowed to pass for local anesthetics to take effect. The epidural needle was then advanced to the target area, via the sacral hiatus, between the sacral cornu. Proper needle placement secured. Negative aspiration confirmed. Step 1: Epidurogram performed by slowly injecting a non-ionic, water-soluble, hypoallergenic, myelogram-compatible radiological contrast. Defect identified and Racz catheter advanced slowly next to rest proximal to it without attempting to perforate or puncture the defect. At this point, the epidural needle was removed. Step 2: Contrast was again injected, this time thru the catheter, under live fluoroscopy, closely observing for vascular uptake or intrathecal spread. Step 3: Once no vascular uptake or intrathecal spread was confirmed, a 2 mL test-dose using 2% PF-Lidocaine with 1:200,000 Epinephrine was injected thru the catheter, while closely monitoring for an increase in heart rate or evidence of spinal anesthesia. Step 4: After waiting over 5  minutes, the patient was assessed for evidence of a spinal block. Step 5: Once I had confirmed that there was no vascular uptake or evidence of intrathecal spread, I then slowly injected 1,500 Units of hyaluronidase, carefully monitoring for allergic reactions. I then waited 10 minutes, using this time to secure the catheter using a sterile benzoin tincture swab and (8m x 100 mm) steri-strip. After confirming vitals to be stable, the patient was transferred to the recovery area where Mr. RRockettwas kept under constant observation and monitored as per post-sedation protocol. Step 6: After the 10 minutes, I proceeded to slowly inject a steroid solution containing 0.2% MPF-Ropivacaine (4 mL) + 0.9% PF-NSS (5 mL) + SDV Triamcinolone 40 mg/mL (1 mL), in intermittent fashion, asking for systemic symptoms every 0.5cc of injectate. Step 7:  30 minutes later, a neurological exam was conducted for any evidence of a spinal blockade. Step 8:  After confirming the absence of anesthesia, I slowly injected the 10% PF-Hypertonic Saline, stopping to assess, any time the patient described any discomfort. Once the procedure was completed, I removed the catheter, taking time to show the patient its spring tip, and demonstrating to the patient that none had been left behind. EBL: None Materials & Medications used:  1. Racz Tun-L-Kath (Epimed, [place] Catheter. (or similar)(Perifix 20Gx100cm) 2. 2" Foam Tape 3. Benzoin tincture swab 4. Steri-Strip (176mx 100 mm) 5. Non-occlusive Transparent Dressing (3MT TegadermT) 6. Bacteriostatic Filter for Epidural Catheter 7. Epidural Kit for 20G catheter Needle(s) used: 20g - 10cm, Tuohy-style epidural needle   Medications used:  1. Skin infiltration: 2.0% Lidocaine (1014m2. Test-dose: 1.5 % Lidocaine w/ Epi 1:200,000 (5ml72m. Epidural Steroid injection:  A). Steroid: Triamcinolone 40 mg/mL (SDV) (1ml)66m. Local Anesthetic: 0.2% PF-Ropivacaine (2 mg/mL) (4  mL) C). Dilution agent: 0.9% PF-NSS (injectable saline) (5 mL) 6. Neurolytic Agent: 10% PF-Sodium Chloride (Hypertonic Saline) (10ml)57m.4% PF-Sodium Chloride (4.273mL) 20m-Sterile H2O (5.727mL) =27m PF-Sodium Chloride (10mL)] 732mar softening agent: Hyaluronidase (150 units/mL) x (10 mL) = 1500 Units 8. Radiological Contrast Media: Isovue-M 200 (10 mL)  Vitals:   12/18/21 0958 12/18/21 1008 12/18/21 1018 12/18/21 1030  BP: (!) 140/80 (!) 140/88 (!) 150/75 (!) 145/72  Pulse:      Resp: _0 Temp:    (!) 97.3 F (  36.3 C)  TempSrc:      SpO2: 100% 100% 97% 97%  Weight:      Height:        Start Time: 0845 hrs. End Time: 0908 hrs. Epidurogram:  Epidurogram flow pattern demonstrated a restricted low at the level of the surgery. The epidural catheter was placed next to this restriction without attempting to perforate it.  Materials:  Needle(s) Type: Epidural needle Gauge: 20G Length: 3.5-in Medication(s): Please see orders for medications and dosing details.  Imaging Guidance (Spinal):          Type of Imaging Technique: Fluoroscopy Guidance (Spinal) Indication(s): Assistance in needle guidance and placement for procedures requiring needle placement in or near specific anatomical locations not easily accessible without such assistance. Exposure Time: Please see nurses notes. Contrast: Before injecting any contrast, we confirmed that the patient did not have an allergy to iodine, shellfish, or radiological contrast. Once satisfactory needle placement was completed at the desired level, radiological contrast was injected. Contrast injected under live fluoroscopy. No contrast complications. See chart for type and volume of contrast used. Fluoroscopic Guidance: I was personally present during the use of fluoroscopy. "Tunnel Vision Technique" used to obtain the best possible view of the target area. Parallax error corrected before commencing the procedure.  "Direction-depth-direction" technique used to introduce the needle under continuous pulsed fluoroscopy. Once target was reached, antero-posterior, oblique, and lateral fluoroscopic projection used confirm needle placement in all planes. Images permanently stored in EMR. Interpretation: I personally interpreted the imaging intraoperatively. Adequate needle placement confirmed in multiple planes. Appropriate spread of contrast into desired area was observed. No evidence of afferent or efferent intravascular uptake. No intrathecal or subarachnoid spread observed. Permanent images saved into the patient's record.  Antibiotic Prophylaxis:   Anti-infectives (From admission, onward)    Start     Dose/Rate Route Frequency Ordered Stop   12/18/21 1000  ceFAZolin (ANCEF) IVPB 1 g/50 mL premix        1 g 100 mL/hr over 30 Minutes Intravenous  Once 12/18/21 0952 12/18/21 0910      Indication(s): None identified  Post-operative Assessment:  Post-procedure Vital Signs:  Pulse/HCG Rate: 7677 Temp: (!) 97.3 F (36.3 C) Resp: 12 BP: (!) 145/72 SpO2: 97 %  EBL: None  Complications: No immediate post-treatment complications observed by team, or reported by patient.  Note: The patient tolerated the entire procedure well. A repeat set of vitals were taken after the procedure and the patient was kept under observation following institutional policy, for this type of procedure. Post-procedural neurological assessment was performed, showing return to baseline, prior to discharge. The patient was provided with post-procedure discharge instructions, including a section on how to identify potential problems. Should any problems arise concerning this procedure, the patient was given instructions to immediately contact us, at any time, without hesitation. In any case, we plan to contact the patient by telephone for a follow-up status report regarding this interventional procedure.  Comments:  No additional  relevant information.  Plan of Care  Orders:  Orders Placed This Encounter  Procedures   Racz (One Day)    Equipment: Epidural Tray; Racz Catheter; 10% Hypertonic Saline; Hyaluronidase; 3/4" Steri-strips; 4x4 Sterile Gauze pack.    Scheduling Instructions:     Procedure: RACZ Epidural Lysis of Adhesions     Side: Midline     Sedation: Moderate Conscious Sedation     Timeframe: Today   DG PAIN CLINIC C-ARM 1-60 MIN NO REPORT    Intraoperative interpretation  by procedural physician at [facility].    Standing Status:   Standing    Number of Occurrences:   1    Order Specific Question:   Reason for exam:    Answer:   Assistance in needle guidance and placement for procedures requiring needle placement in or near specific anatomical locations not easily accessible without such assistance.   Informed Consent Details: Physician/Practitioner Attestation; Transcribe to consent form and obtain patient signature    Nursing Order: Transcribe to consent form and obtain patient signature. Note: Always confirm laterality of pain with Mr. Barfield, before procedure.    Order Specific Question:   Physician/Practitioner attestation of informed consent for procedure/surgical case    Answer:   I, the physician/practitioner, attest that I have discussed with the patient the benefits, risks, side effects, alternatives, likelihood of achieving goals and potential problems during recovery for the procedure that I have provided informed consent.    Order Specific Question:   Procedure    Answer:   RACZ Procedure (Epidural Lysis of Adhesions)    Order Specific Question:   Physician/Practitioner performing the procedure    Answer:   Aubrianna Orchard A. Dossie Arbour, MD    Order Specific Question:   Indication/Reason    Answer:   Chronic low back pain and lower extremity pain secondary to epidural fibrosis associated with a failed back surgical syndrome   Care order/instruction: Please confirm that the patient has  stopped the Effient (Prasugrel) x 10 days prior to procedure or surgery.    Please confirm that the patient has stopped the Effient (Prasugrel) x 10 days prior to procedure or surgery.    Standing Status:   Standing    Number of Occurrences:   1   Provide equipment / supplies at bedside    "Epidural Tray" (Disposable  single use) Catheter: NOT required    Standing Status:   Standing    Number of Occurrences:   1    Order Specific Question:   Specify    Answer:   Epidural Tray   Bleeding precautions    Standing Status:   Standing    Number of Occurrences:   1   Chronic Opioid Analgesic:  Oxycodone IR 5 mg, 1 tab p.o. every 4 hours (30 mg/day of oxycodone) MME/day: 45 mg/day.   Medications ordered for procedure: Meds ordered this encounter  Medications   iohexol (OMNIPAQUE) 180 MG/ML injection 10 mL    Must be Myelogram-compatible. If not available, you may substitute with a water-soluble, non-ionic, hypoallergenic, myelogram-compatible radiological contrast medium.   lidocaine (XYLOCAINE) 2 % (with pres) injection 400 mg   pentafluoroprop-tetrafluoroeth (GEBAUERS) aerosol   lactated ringers infusion   midazolam (VERSED) 5 MG/5ML injection 0.5-2 mg    Make sure Flumazenil is available in the pyxis when using this medication. If oversedation occurs, administer 0.2 mg IV over 15 sec. If after 45 sec no response, administer 0.2 mg again over 1 min; may repeat at 1 min intervals; not to exceed 4 doses (1 mg)   fentaNYL (SUBLIMAZE) injection 25-50 mcg    Make sure Narcan is available in the pyxis when using this medication. In the event of respiratory depression (RR< 8/min): Titrate NARCAN (naloxone) in increments of 0.1 to 0.2 mg IV at 2-3 minute intervals, until desired degree of reversal.   lidocaine HCl (PF) (XYLOCAINE) 2 % injection 10 mL    If 2.0 % is unavailable, it may be substituted with 1.5 %. Always notify physician of changes  before procedure.   lidocaine-EPINEPHrine  (XYLOCAINE W/EPI) 2 %-1:200000 (PF) injection 10 mL    If 2% is unavailable, may be substituted with 1.5%, but must be preservative-free. If 1:200,000 concentration of epinephrine is not available, it may be substituted with 1:100,000. Notify physician of changes, before procedure.   ropivacaine (PF) 2 mg/mL (0.2%) (NAROPIN) injection 4 mL   triamcinolone acetonide (KENALOG-40) injection 40 mg   hyaluronidase Human (HYLENEX) injection 1,500 Units    10 mL of the 150 Units/mL   sodium chloride hypertonic 10% epidural injection    For Racz Epidural Lysis of Adhesions.   sodium chloride flush (NS) 0.9 % injection 4 mL   ceFAZolin (ANCEF) IVPB 1 g/50 mL premix    Order Specific Question:   Antibiotic Indication:    Answer:   Surgical Prophylaxis    Order Specific Question:   Other Indication:    Answer:   Procedure Prophylaxis   Medications administered: We administered iohexol, lidocaine, pentafluoroprop-tetrafluoroeth, lactated ringers, lidocaine-EPINEPHrine, ropivacaine (PF) 2 mg/mL (0.2%), triamcinolone acetonide, hyaluronidase Human, sodium chloride flush, and ceFAZolin.  See the medical record for exact dosing, route, and time of administration.  Follow-up plan:   Return in about 2 weeks (around 01/01/2022) for Proc-day (T,Th), (VV), (PPE).       Interventional Therapies  Risk  Complexity Considerations:   Estimated body mass index is 28.94 kg/m as calculated from the following:   Height as of this encounter: _0  (1.753 m).   Weight as of this encounter: 196 lb (88.9 kg). NOTE: EFFIENT Anticoagulation (Stop: 7-10 days  Re-start: 6 hrs)   Planned  Pending:   Therapeutic right Racz procedure #1    Under consideration:   CYP2D6 / CYP3A4 genetic testing.   Completed:   Diagnostic/therapeutic right caudal ESI x1 (11/13/2021)  Palliative right PSIS MNB/TPI x3 (11/13/2017) (100/100/100)  Diagnostic/palliative right lumbar facet MBB x2 (09/22/2018) (100/100/75/75)   Diagnostic/palliative left lumbar facet MBB x2 (09/22/2018) (100/100/75/75)  Palliative left lumbar facet RFA x3 (02/20/2021) (85/85/55/60-70)  Palliative right lumbar facet RFA x3 (01/09/2021) (100/95/50/60-70)    Therapeutic  Palliative (PRN) options:   Palliative right PSIS MNB/TPI  Diagnostic/palliative lumbar facet MBB  Palliative lumbar facet RFA      Recent Visits Date Type Provider Dept  12/04/21 Office Visit Milinda Pointer, MD [facility]  11/27/21 Office Visit Milinda Pointer, MD [facility]  11/13/21 Procedure visit Milinda Pointer, MD [facility]  11/07/21 Office Visit Milinda Pointer, MD [facility]  11/05/21 Office Visit Milinda Pointer, MD [facility]  10/01/21 Office Visit Milinda Pointer, MD [facility]  Showing recent visits within past 90 days and meeting all other requirements Today's Visits Date Type Provider Dept  12/18/21 Procedure visit Milinda Pointer, MD [facility]  Showing today's visits and meeting all other requirements Future Appointments Date Type Provider Dept  01/02/22 Appointment Milinda Pointer, MD [facility]  01/03/22 Appointment Milinda Pointer, MD [facility]  Showing future appointments within next 90 days and meeting all other requirements  Disposition: Discharge home  Discharge (Date  Time): 12/18/2021; 1039 hrs.   Primary Care Physician: [name], MD Location: [facility] Note by: Gaspar Cola, MD Date: 12/18/2021; Time: 10:46 AM  Disclaimer:  Medicine is not an exact science. The only guarantee in medicine is that nothing is guaranteed. It is important to note that the decision to proceed with this intervention was based on the information collected from  the patient. The Data and conclusions were drawn from the patient's questionnaire, the interview, and the physical  examination. Because the information was provided in large part by the patient, it cannot be guaranteed that it has not been purposely or unconsciously manipulated. Every effort has been made to obtain as much relevant data as possible for this evaluation. It is important to note that the conclusions that lead to this procedure are derived in large part from the available data. Always take into account that the treatment will also be dependent on availability of resources and existing treatment guidelines, considered by other Pain Management Practitioners as being common knowledge and practice, at the time of the intervention. For Medico-Legal purposes, it is also important to point out that variation in procedural techniques and pharmacological choices are the acceptable norm. The indications, contraindications, technique, and results of the above procedure should only be interpreted and judged by a Board-Certified Interventional Pain Specialist with extensive familiarity and expertise in the same exact procedure and technique.

## 2021-12-18 NOTE — Patient Instructions (Addendum)
___________________________________________________________________________________________  Post-Radiofrequency (RF) Discharge Instructions  You have just completed a Radiofrequency Neurotomy.  The following instructions will provide you with information and guidelines for self-care upon discharge.  If at any time you have questions or concerns please call your physician. DO NOT DRIVE YOURSELF!!  Instructions: Apply ice: Fill a plastic sandwich bag with crushed ice. Cover it with a small towel and apply to injection site. Apply for 15 minutes then remove x 15 minutes. Repeat sequence on day of procedure, until you go to bed. The purpose is to minimize swelling and discomfort after procedure. Apply heat: Apply heat to procedure site starting the day following the procedure. The purpose is to treat any soreness and discomfort from the procedure. Food intake: No eating limitations, unless stipulated above.  Nevertheless, if you have had sedation, you may experience some nausea.  In this case, it may be wise to wait at least two hours prior to resuming regular diet. Physical activities: Keep activities to a minimum for the first 8 hours after the procedure. For the first 24 hours after the procedure, do not drive a motor vehicle,  Operate heavy machinery, power tools, or handle any weapons.  Consider walking with the use of an assistive device or accompanied by an adult for the first 24 hours.  Do not drink alcoholic beverages including beer.  Do not make any important decisions or sign any legal documents. Go home and rest today.  Resume activities tomorrow, as tolerated.  Use caution in moving about as you may experience mild leg weakness.  Use caution in cooking, use of household electrical appliances and climbing steps. Driving: If you have received any sedation, you are not allowed to drive for 24 hours after your procedure. Blood thinner: Restart your blood thinner 6 hours after your procedure. (Only  for those taking blood thinners) Insulin: As soon as you can eat, you may resume your normal dosing schedule. (Only for those taking insulin) Medications: May resume pre-procedure medications.  Do not take any drugs, other than what has been prescribed to you. Infection prevention: Keep procedure site clean and dry. Post-procedure Pain Diary: Extremely important that this be done correctly and accurately. Recorded information will be used to determine the next step in treatment. Pain evaluated is that of treated area only. Do not include pain from an untreated area. Complete every hour, on the hour, for the initial 8 hours. Set an alarm to help you do this part accurately. Do not go to sleep and have it completed later. It will not be accurate. Follow-up appointment: Keep your follow-up appointment after the procedure. Usually 2-6 weeks after radiofrequency. Bring you pain diary. The information collected will be essential for your long-term care.   Expect: From numbing medicine (AKA: Local Anesthetics): Numbness or decrease in pain. Onset: Full effect within 15 minutes of injected. Duration: It will depend on the type of local anesthetic used. On the average, 1 to 8 hours.  From steroids (when added): Decrease in swelling or inflammation. Once inflammation is improved, relief of the pain will follow. Onset of benefits: Depends on the amount of swelling present. The more swelling, the longer it will take for the benefits to be seen. In some cases, up to 10 days. Duration: Steroids will stay in the system x 2 weeks. Duration of benefits will depend on multiple posibilities including persistent irritating factors. From procedure: Some discomfort is to be expected once the numbing medicine wears off. In the case of radiofrequency procedures,  this may last as long as 6 weeks. Additional post-procedure pain medication is provided for this. Discomfort is minimized if ice and heat are applied as  instructed.  Call if: You experience numbness and weakness that gets worse with time, as opposed to wearing off. He experience any unusual bleeding, difficulty breathing, or loss of the ability to control your bowel and bladder. (This applies to Spinal procedures only) You experience any redness, swelling, heat, red streaks, elevated temperature, fever, or any other signs of a possible infection.  Emergency Numbers: [facility] hours (Monday - Thursday, 8:00 AM - 4:00 PM) (Friday, 9:00 AM - 12:00 Noon): [phone] After hours: [phone] ____________________________________________________________________________________________  ____________________________________________________________________________________________  Patient Information update  To: All of our patients.  Re: Name change.  It has been made official that our current name, "[facility]"   will soon be changed to "[facility]".   The purpose of this change is to eliminate any confusion created by the concept of our practice being a "Medication Management Pain Clinic". In the past this has led to the misconception that we treat pain primarily by the use of prescription medications.  Nothing can be farther from the truth.   Understanding PAIN MANAGEMENT: To further understand what our practice does, you first have to understand that "Pain Management" is a subspecialty that requires additional training once a physician has completed their specialty training, which can be in either Anesthesia, Neurology, Psychiatry, or Physical Medicine and Rehabilitation (PMR). Each one of these contributes to the final approach taken by each physician to the management of their patient's pain. To be a "Pain Management Specialist" you must have first completed one of the specialty trainings below.  Anesthesiologists -  trained in clinical pharmacology and interventional techniques such as nerve blockade and regional as well as central neuroanatomy. They are trained to block pain before, during, and after surgical interventions.  Neurologists - trained in the diagnosis and pharmacological treatment of complex neurological conditions, such as Multiple Sclerosis, Parkinson's, spinal cord injuries, and other systemic conditions that may be associated with symptoms that may include but are not limited to pain. They tend to rely primarily on the treatment of chronic pain using prescription medications.  Psychiatrist - trained in conditions affecting the psychosocial wellbeing of patients including but not limited to depression, anxiety, schizophrenia, personality disorders, addiction, and other substance use disorders that may be associated with chronic pain. They tend to rely primarily on the treatment of chronic pain using prescription medications.   Physical Medicine and Rehabilitation (PMR) physicians, also known as physiatrists - trained to treat a wide variety of medical conditions affecting the brain, spinal cord, nerves, bones, joints, ligaments, muscles, and tendons. Their training is primarily aimed at treating patients that have suffered injuries that have caused severe physical impairment. Their training is primarily aimed at the physical therapy and rehabilitation of those patients. They may also work alongside orthopedic surgeons or neurosurgeons using their expertise in assisting surgical patients to recover after their surgeries.  INTERVENTIONAL PAIN MANAGEMENT is sub-subspecialty of Pain Management.  Our physicians are Board-certified in Anesthesia, Pain Management, and Interventional Pain Management.  This meaning that not only have they been trained and Board-certified in their specialty of Anesthesia, and subspecialty of Pain Management, but they have also received further training in the sub-subspecialty of  Interventional Pain Management, in order to become Board-certified as INTERVENTIONAL PAIN MANAGEMENT SPECIALIST.  Mission: Our goal is to use our skills in  [place] as alternatives to the chronic use of prescription opioid medications for the treatment of pain. To make this more clear, we have changed our name to reflect what we do and offer. We will continue to offer medication management assessment and recommendations, but we will not be taking over any patient's medication management.  ____________________________________________________________________________________________  ____________________________________________________________________________________________  Virtual Visits   What is a "Virtual Visit"? It is a [employer]all[employer]ist (medical visit) that takes place on real time (NOT TEXT or E-MAIL) over the telephone or computer device (desktop, laptop, tablet, smart phone, etc.). It allows for more location flexibility between the patient and the healthcare provider.  Who decides when these types of visits will be used? The physician.  Who is eligible for these types of visits? Only those patients that can be reliably reached over the telephone.  What do you mean by reliably? We do not have time to call everyone multiple times, therefore those that tend to screen calls and then call back later are not suitable candidates for this system. We understand how people are reluctant to pickup on "unknown" calls, therefore, we suggest adding our telephone numbers to your list of "CONTACT(s)". This way, you should be able to readily identify our calls when you receive one. All of our numbers are available below.   Who is not eligible? This option is not available for medication management encounters, specially for controlled substances. Patients on pain medications that fall under the category of controlled substances have to come in for "Face-to-Face"  encounters. This is required for mandatory monitoring of these substances. You may be asked to provide a sample for an unannounced urine drug screening test (UDS), and we will need to count your pain pills. Not bringing your pills to be counted may result in no refill. Obviously, neither one of these can be done over the phone.  When will this type of visits be used? You can request a virtual visit whenever you are physically unable to attend a regular appointment. The decision will be made by the physician (or healthcare provider) on a case by case basis.   At what time will I be called? This is an excellent question. The providers will try to call you whenever they have time available. Do not expect to be called at any specific time. The secretaries will assign you a time for your virtual visit appointment, but this is done simply to keep a list of those patients that need to be called, but not for the purpose of keeping a time schedule. Be advised that the call may come in anytime during the day, between the hours of 8:00 AM and 8::00 PM, depending on provider availability. We do understand that the system is not perfect. If you are unable to be available that day on a moments notice, then request an "in-person" appointment rather than a "virtual visit".  Can I request my medication visits to be "Virtual"? Yes you may request it, but the decision is entirely up to the healthcare provider. Control substances require specific monitoring that requires Face-to-Face encounters. The number of encounters  and the extent of the monitoring is determined on a case by case basis.  Add a new contact to your smart phone and label it "PAIN CLINIC" Under this contact add the following numbers: Main: [phone] (Official Contact Number) Nurses: [phone] (These are outgoing only calling systems. Do not call this  number.) Dr. Dossie Arbour: [phone] or [phone] (Outgoing calls only. Do not call this  number.)  ____________________________________________________________________________________________

## 2021-12-19 ENCOUNTER — Telehealth: Payer: Self-pay | Admitting: *Deleted

## 2021-12-19 NOTE — Telephone Encounter (Signed)
Attempted to call for post procedure follow-up. Message left. 

## 2022-01-01 NOTE — Progress Notes (Unsigned)
PROVIDER NOTE: Information contained herein reflects review and annotations entered in association with encounter. Interpretation of such information and data should be left to medically-trained personnel. Information provided to patient can be located elsewhere in the medical record under "Patient Instructions". Document created using STT-dictation technology, any transcriptional errors that may result from process are unintentional.    Patient: Kenneth Osborn  Service Category: E/M  Provider: Gaspar Cola, MD  DOB: [birth year]  DOS: 01/02/2022  Referring Provider: [name], MD  MRN: [id number]  Specialty: Interventional Pain Management  PCP: [name], MD  Type: Established Patient  Setting: Ambulatory outpatient    Location: Office  Delivery: Face-to-face     HPI  [name], a 66 y.o. year old male, is here today because of his No primary diagnosis found.. [name]'s primary complain today is No chief complaint on file. Last encounter: My last encounter with him was on 12/18/2021. Pertinent problems: 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 Beverely Pace at [facility] on 01/04/2013); Neurogenic pain; Thoracic facet syndrome (T8-10); Lumbar facet syndrome (Bilateral) (R>L); Cervical facet syndrome (Right); Cervical spondylosis; Lumbar spondylosis; Chronic upper extremity pain (Left); Chronic cervical radicular pain (Left); Chronic upper back pain; History of thoracic spine surgery (S/P T9-10 IVD spacer); Failed back surgical syndrome; Chronic musculoskeletal pain; Chronic lower extremity pain (Left); Chronic Lumbar radicular pain (Left: L4); Arthralgia of shoulder; Chronic tension-type headache, not intractable; 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); Chronic pain syndrome; Headache  disorder; Trigger point posterior superior iliac spine (PSIS) (Right); 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); Polyneuropathy; Type 2 diabetes mellitus with diabetic polyneuropathy, with long-term current use of insulin ([place]); Spondylosis without myelopathy or radiculopathy, lumbosacral region; Frequent falls; Diabetic polyneuropathy associated with diabetes mellitus due to underlying condition ([place]); Neuropathic pain; Musculoskeletal pain; DDD (degenerative disc disease), lumbosacral; Chronic low back pain (Bilateral) w/o sciatica; Renal mass; 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; Malignant neoplasm of left kidney, except renal pelvis ([place]); Chronic low back pain (Right) w/ sciatica (Right); Chronic lower extremity pain (Right); Chronic Lumbar radicular pain (Right: S1); and Epidural fibrosis on their pertinent problem list. Pain Assessment: Severity of   is reported as a  /10. Location:    / . Onset:  . Quality:  . Timing:  . Modifying factor(s):  Marland Kitchen Vitals:  vitals were not taken for this visit.   Reason for encounter: both, medication management and post-procedure evaluation and assessment. ***  Post-procedure evaluation    Procedure:          Anesthesia, Analgesia, Anxiolysis:  Type: Therapeutic Percutaneous Epidural Neuroplasty and Lysis of Adhesions (RACZ Procedure)  #1  Region: Caudal Level: Sacrococcygeal   Laterality: Midline aiming at the right  Anesthesia: Local (1-2% Lidocaine)  Anxiolysis: None  Sedation: None  Guidance: Fluoroscopy           Position: Prone   1. Failed back surgical syndrome   2. Epidural fibrosis   3. DDD (degenerative disc disease), lumbosacral   4. 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 Chronic lower extremity pain (Right)   6. Chronic Lumbar radicular pain (Right: S1)   7. Chronic anticoagulation (Effient)    NAS-11 Pain score:   Pre-procedure: 3 /10   Post-procedure: 0-No pain/10       Effectiveness:  Initial hour after procedure:   ***. Subsequent 4-6 hours post-procedure:   ***. Analgesia past initial 6 hours:   ***. Ongoing improvement:  Analgesic:  *** Function:    ***    ROM:    ***     Pharmacotherapy Assessment  Analgesic: Oxycodone IR 5 mg, 1 tab p.o. every 4 hours (30 mg/day of oxycodone) MME/day: 45 mg/day.   Monitoring:  PMP: PDMP reviewed during this encounter.       Pharmacotherapy: No side-effects or adverse reactions reported. Compliance: No problems identified. Effectiveness: Clinically acceptable.  No notes on file  No results found for: "CBDTHCR" No results found for: "D8THCCBX" No results found for: "D9THCCBX"  UDS:  Summary  Date Value Ref Range Status  06/27/2021 Note  Final    Comment:    ==================================================================== [employer] Select 13 (MW) ==================================================================== Test                             Result       Flag       Units  Drug Present and Declared for Prescription Verification   Desmethyldiazepam              222          EXPECTED   ng/mg creat   Oxazepam                       665          EXPECTED   ng/mg creat   Temazepam                      411          EXPECTED   ng/mg creat    Desmethyldiazepam, oxazepam, and temazepam are expected metabolites    of diazepam. Desmethyldiazepam and oxazepam are also expected    metabolites of other drugs, including chlordiazepoxide, prazepam,    clorazepate, and halazepam. Oxazepam is an expected metabolite of    temazepam. Oxazepam and temazepam are also available as scheduled    prescription medications.    Oxycodone                      371          EXPECTED   ng/mg creat   Oxymorphone                    1345         EXPECTED   ng/mg creat   Noroxycodone                   511          EXPECTED   ng/mg creat   Noroxymorphone                 320          EXPECTED   ng/mg creat    Sources of  oxycodone are scheduled prescription medications.    Oxymorphone, noroxycodone, and noroxymorphone are expected    metabolites of oxycodone. Oxymorphone is also available as a    scheduled prescription medication.  Drug Absent but Declared for Prescription Verification   Alprazolam                     Not Detected UNEXPECTED ng/mg creat ==================================================================== Test  Result    Flag   Units      Ref Range   Creatinine              65               mg/dL      >=20 ==================================================================== Declared Medications:  The flagging and interpretation on this report are based on the  following declared medications.  Unexpected results may arise from  inaccuracies in the declared medications.   **Note: The testing scope of this panel includes these medications:   Alprazolam (Xanax)  Diazepam (Valium)  Oxycodone (Roxicodone)   **Note: The testing scope of this panel does not include the  following reported medications:   Aspirin  Cetirizine (Zyrtec)  Cyclobenzaprine (Flexeril)  Diltiazem (Cardizem)  Docusate (Senokot-S)  Enalapril (Vasotec)  Esomeprazole (Nexium)  Ezetimibe (Zetia)  Gabapentin (Neurontin)  Glucagon  Insulin (Humulin)  Isosorbide (Imdur)  Magnesium (Mag-Ox)  Meclizine (Antivert)  Metformin (Glucophage)  Metoprolol (Toprol)  Montelukast (Singulair)  Naloxegol (Movantik)  Niacin  Nitroglycerin (Nitrostat)  Polyethylene Glycol (MiraLAX)  Prasugrel (Effient)  Rosuvastatin (Crestor)  Sennosides (Senokot-S) ==================================================================== For clinical consultation, please call [phone]. ====================================================================       ROS  Constitutional: Denies any fever or chills Gastrointestinal: No reported hemesis, hematochezia, vomiting, or acute GI distress Musculoskeletal: Denies  any acute onset joint swelling, redness, loss of ROM, or weakness Neurological: No reported episodes of acute onset apraxia, aphasia, dysarthria, agnosia, amnesia, paralysis, loss of coordination, or loss of consciousness  Medication Review  ALPRAZolam, Glucagon, Magnesium Oxide, aspirin EC, cetirizine, cyclobenzaprine, diltiazem, esomeprazole, ezetimibe, gabapentin, insulin regular human CONCENTRATED, isosorbide mononitrate, meclizine, metFORMIN, metoprolol succinate, montelukast, naloxegol oxalate, niacin, nitroGLYCERIN, oxyCODONE, polyethylene glycol, prasugrel, rosuvastatin, and senna-docusate  History Review  Allergy: [name] has No Known Allergies. Drug: [name]  reports no history of drug use. Alcohol:  reports no history of alcohol use. Tobacco:  reports that he has never smoked. He has never used smokeless tobacco. Social: [name]  reports that he has never smoked. He has never used smokeless tobacco. He reports that he does not drink alcohol and does not use drugs. Medical:  has a past medical history of Acute postoperative pain (12/03/2018), Allergic rhinitis (12/30/2012), Anginal pain ([place]), Anxiety, Bronchitis, Can't get food down (08/12/2014), Cancer ([place]) (01/2020), Chronic back pain, Concussion (09/2015), Coronary artery disease, DDD (degenerative disc disease), cervical, Dehydration symptoms, Diabetes mellitus without complication ([place]), Dysphagia, GERD (gastroesophageal reflux disease), History of Meniere's disease (12/21/2014), History of thoracic spine surgery (S/P T9-10 IVD spacer) (12/21/2014), Hypercholesteremia, Hyperlipidemia, Hypertension, Meniere's disease, Myocardial infarction ([place]) (2012), Neuromuscular disorder ([place]), Renal disorder, Severe sepsis ([place]) (02/07/2020), Short-segment Barrett's esophagus, and Sleep apnea. Surgical: [name]  has a past surgical history that includes Cardiovascular stress test; Cardiac catheterization; Labrinthectomy; mastoid shunt  (Bilateral); Back surgery; Shoulder arthroscopy with subacromial decompression (Left, 04/06/2012); ARTHRODESIS ANTERIOR ANTERIOR CERVICLE SPINE (01/04/2013); Colonoscopy with propofol (N/A, 09/19/2014); Esophagogastroduodenoscopy (N/A, 09/19/2014); Savory dilation (N/A, 09/19/2014); Spinal cord stimulator implant (Right); Cardiac catheterization (N/A, 06/15/2015); Cardiac catheterization (N/A, 06/15/2015); Pulse generator implant (N/A, 01/18/2019); Lumbar spinal cord simulator lead removal (Right, 08/09/2019); IR Perc Cholecystostomy (02/09/2020); IR Radiologist Eval & Mgmt (03/14/2020); Coronary angioplasty; Appendectomy; Robot assisted laparoscopic nephrectomy (Left, 04/20/2020); and Colonoscopy with propofol (N/A, 04/03/2021). Family: family history includes Cancer in his sister; Diabetes in his maternal grandmother, mother, and paternal grandmother; Heart disease in his father, maternal aunt, maternal uncle, and mother.  Laboratory Chemistry Profile   Renal Lab Results  Component Value Date   BUN 37 (H) 11/02/2021   CREATININE 2.18 (H) 11/02/2021   GFRAA 58 (L) 01/08/2019   GFRNONAA 32 (L) 11/02/2021    Hepatic Lab Results  Component Value Date   AST 33 11/01/2021   ALT 31 11/01/2021   ALBUMIN 4.0 11/01/2021   ALKPHOS 79 11/01/2021   HCVAB NON REACTIVE 02/08/2020   LIPASE 28 12/11/2020   AMMONIA 24 02/07/2020    Electrolytes Lab Results  Component Value Date   NA 138 11/02/2021   K 5.9 (H) 11/02/2021   CL 107 11/02/2021   CALCIUM 9.8 11/02/2021   MG 1.6 (L) 11/02/2021   PHOS 3.1 11/09/2020    Bone No results found for: "VD25OH", "VD125OH2TOT", "ZP9150VW9", "VX4801KP5", "25OHVITD1", "25OHVITD2", "37SMOLMB8", "TESTOFREE", "TESTOSTERONE"  Inflammation (CRP: Acute Phase) (ESR: Chronic Phase) Lab Results  Component Value Date   CRP 1.4 (H) 03/22/2015   ESRSEDRATE 9 03/22/2015   LATICACIDVEN 1.5 12/11/2020         Note: Above Lab results reviewed.  Recent Imaging Review  DG PAIN CLINIC  C-ARM 1-60 MIN NO REPORT Fluoro was used, but no Radiologist interpretation will be provided.  Please refer to "NOTES" tab for provider progress note. Note: Reviewed        Physical Exam  General appearance: Well nourished, well developed, and well hydrated. In no apparent acute distress Mental status: Alert, oriented x 3 (person, place, & time)       Respiratory: No evidence of acute respiratory distress Eyes: PERLA Vitals: There were no vitals taken for this visit. BMI: Estimated body mass index is 32.49 kg/m as calculated from the following:   Height as of 12/18/21: _0  (1.753 m).   Weight as of 12/18/21: 220 lb (99.8 kg). Ideal: Patient weight not recorded  Assessment   Diagnosis Status  No diagnosis found. Controlled Controlled Controlled   Updated Problems: No problems updated.   Plan of Care  Problem-specific:  No problem-specific Assessment & Plan notes found for this encounter.  [name] has a current medication list which includes the following long-term medication(s): cetirizine, cyclobenzaprine, esomeprazole, ezetimibe, gabapentin, humulin r u-500 kwikpen, metoprolol succinate, montelukast, niacin, oxycodone, oxycodone, and oxycodone.  Pharmacotherapy (Medications Ordered): No orders of the defined types were placed in this encounter.  Orders:  No orders of the defined types were placed in this encounter.  Follow-up plan:   No follow-ups on file.     Interventional Therapies  Risk  Complexity Considerations:   Estimated body mass index is 28.94 kg/m as calculated from the following:   Height as of this encounter: _1  (1.753 m).   Weight as of this encounter: 196 lb (88.9 kg). NOTE: EFFIENT Anticoagulation (Stop: 7-10 days  Re-start: 6 hrs)   Planned  Pending:   Therapeutic right Racz procedure #1    Under consideration:   CYP2D6 / CYP3A4 genetic testing.   Completed:   Diagnostic/therapeutic right caudal ESI x1 (11/13/2021)   Palliative right PSIS MNB/TPI x3 (11/13/2017) (100/100/100)  Diagnostic/palliative right lumbar facet MBB x2 (09/22/2018) (100/100/75/75)  Diagnostic/palliative left lumbar facet MBB x2 (09/22/2018) (100/100/75/75)  Palliative left lumbar facet RFA x3 (02/20/2021) (85/85/55/60-70)  Palliative right lumbar facet RFA x3 (01/09/2021) (100/95/50/60-70)    Therapeutic  Palliative (PRN) options:   Palliative right PSIS MNB/TPI  Diagnostic/palliative lumbar facet MBB  Palliative lumbar facet RFA       Recent Visits Date Type Provider Dept  12/18/21 Procedure visit Milinda Pointer, [facility]  12/04/21 Office Visit Milinda Pointer, MD [facility]  11/27/21 Office Visit Milinda Pointer, MD [facility]  11/13/21 Procedure visit Milinda Pointer, MD [facility]  11/07/21 Office Visit Milinda Pointer, MD [facility]  11/05/21 Office Visit Milinda Pointer, MD [facility]  Showing recent visits within past 90 days and meeting all other requirements Future Appointments Date Type Provider Dept  01/02/22 Appointment Milinda Pointer, MD [facility]  01/03/22 Appointment Milinda Pointer, MD [facility]  Showing future appointments within next 90 days and meeting all other requirements  I discussed the assessment and treatment plan with the patient. The patient was provided an opportunity to ask questions and all were answered. The patient agreed with the plan and demonstrated an understanding of the instructions.  Patient advised to call back or seek an in-person evaluation if the symptoms or condition worsens.  Duration of encounter: *** minutes.  Total time on encounter, as per AMA guidelines included both the face-to-face and non-face-to-face time personally spent by the physician and/or other qualified health care professional(s) on the day of the encounter (includes time in activities that require  the physician or other qualified health care professional and does not include time in activities normally performed by clinical staff). Physician's time may include the following activities when performed: preparing to see the patient (eg, review of tests, pre-charting review of records) obtaining and/or reviewing separately obtained history performing a medically appropriate examination and/or evaluation counseling and educating the patient/family/caregiver ordering medications, tests, or procedures referring and communicating with other health care professionals (when not separately reported) documenting clinical information in the electronic or other health record independently interpreting results (not separately reported) and communicating results to the patient/ family/caregiver care coordination (not separately reported)  Note by: Gaspar Cola, MD Date: 01/02/2022; Time: 3:42 PM

## 2022-01-02 ENCOUNTER — Encounter: Payer: Self-pay | Admitting: Pain Medicine

## 2022-01-02 ENCOUNTER — Ambulatory Visit: Payer: Medicare Other | Attending: Pain Medicine | Admitting: Pain Medicine

## 2022-01-02 VITALS — BP 123/81 | HR 67 | Temp 97.2°F | Ht 69.0 in | Wt 215.0 lb

## 2022-01-02 DIAGNOSIS — M7918 Myalgia, other site: Secondary | ICD-10-CM | POA: Diagnosis present

## 2022-01-02 DIAGNOSIS — M79605 Pain in left leg: Secondary | ICD-10-CM | POA: Diagnosis present

## 2022-01-02 DIAGNOSIS — M47894 Other spondylosis, thoracic region: Secondary | ICD-10-CM

## 2022-01-02 DIAGNOSIS — G8929 Other chronic pain: Secondary | ICD-10-CM | POA: Diagnosis present

## 2022-01-02 DIAGNOSIS — M79602 Pain in left arm: Secondary | ICD-10-CM

## 2022-01-02 DIAGNOSIS — M545 Low back pain, unspecified: Secondary | ICD-10-CM

## 2022-01-02 DIAGNOSIS — Z79891 Long term (current) use of opiate analgesic: Secondary | ICD-10-CM | POA: Diagnosis present

## 2022-01-02 DIAGNOSIS — G894 Chronic pain syndrome: Secondary | ICD-10-CM | POA: Diagnosis not present

## 2022-01-02 DIAGNOSIS — Z79899 Other long term (current) drug therapy: Secondary | ICD-10-CM

## 2022-01-02 DIAGNOSIS — M549 Dorsalgia, unspecified: Secondary | ICD-10-CM | POA: Diagnosis present

## 2022-01-02 DIAGNOSIS — M47812 Spondylosis without myelopathy or radiculopathy, cervical region: Secondary | ICD-10-CM

## 2022-01-02 DIAGNOSIS — M961 Postlaminectomy syndrome, not elsewhere classified: Secondary | ICD-10-CM

## 2022-01-02 MED ORDER — NALOXONE HCL 4 MG/0.1ML NA LIQD: 1.0000 | NASAL | 0 refills | Status: DC | PRN

## 2022-01-02 MED ORDER — OXYCODONE HCL 5 MG PO TABS: 5.0000 mg | ORAL_TABLET | Freq: Every day | ORAL | 0 refills | Status: DC

## 2022-01-02 NOTE — Patient Instructions (Signed)
____________________________________________________________________________________________  Patient Information update  To: All of our patients.  Re: Name change.  It has been made official that our current name, "[facility]"   will soon be changed to "[place]".   The purpose of this change is to eliminate any confusion created by the concept of our practice being a "Medication Management Pain Clinic". In the past this has led to the misconception that we treat pain primarily by the use of prescription medications.  Nothing can be farther from the truth.   Understanding PAIN MANAGEMENT: To further understand what our practice does, you first have to understand that "Pain Management" is a subspecialty that requires additional training once a physician has completed their specialty training, which can be in either Anesthesia, Neurology, Psychiatry, or Physical Medicine and Rehabilitation (PMR). Each one of these contributes to the final approach taken by each physician to the management of their patient's pain. To be a "Pain Management Specialist" you must have first completed one of the specialty trainings below.  Anesthesiologists - trained in clinical pharmacology and interventional techniques such as nerve blockade and regional as well as central neuroanatomy. They are trained to block pain before, during, and after surgical interventions.  Neurologists - trained in the diagnosis and pharmacological treatment of complex neurological conditions, such as Multiple Sclerosis, Parkinson's, spinal cord injuries, and other systemic conditions that may be associated with symptoms that may include but are not limited to pain. They tend to rely primarily on the treatment of chronic pain using prescription medications.  Psychiatrist - trained in conditions affecting the psychosocial wellbeing  of patients including but not limited to depression, anxiety, schizophrenia, personality disorders, addiction, and other substance use disorders that may be associated with chronic pain. They tend to rely primarily on the treatment of chronic pain using prescription medications.   Physical Medicine and Rehabilitation (PMR) physicians, also known as physiatrists - trained to treat a wide variety of medical conditions affecting the brain, spinal cord, nerves, bones, joints, ligaments, muscles, and tendons. Their training is primarily aimed at treating patients that have suffered injuries that have caused severe physical impairment. Their training is primarily aimed at the physical therapy and rehabilitation of those patients. They may also work alongside orthopedic surgeons or neurosurgeons using their expertise in assisting surgical patients to recover after their surgeries.  INTERVENTIONAL PAIN MANAGEMENT is sub-subspecialty of Pain Management.  Our physicians are Board-certified in Anesthesia, Pain Management, and Interventional Pain Management.  This meaning that not only have they been trained and Board-certified in their specialty of Anesthesia, and subspecialty of Pain Management, but they have also received further training in the sub-subspecialty of Interventional Pain Management, in order to become Board-certified as INTERVENTIONAL PAIN MANAGEMENT SPECIALIST.    Mission: Our goal is to use our skills in  [place] as alternatives to the chronic use of prescription opioid medications for the treatment of pain. To make this more clear, we have changed our name to reflect what we do and offer. We will continue to offer medication management assessment and recommendations, but we will not be taking over any patient's medication management.  ____________________________________________________________________________________________   _______________________________________________________________________  Medication Rules  Purpose: To inform patients, and their family members, of our medication rules and regulations.  Applies to: All patients receiving prescriptions from our practice (written or electronic).  Pharmacy of record: This is the pharmacy where your electronic prescriptions will be sent.  Make sure we have the correct one.  Electronic prescriptions: In compliance with the [place] (STOP) Act of 2017 (Session Lanny Cramp [phone]), effective February 18, 2018, all controlled substances must be electronically prescribed. Written prescriptions, faxing, or calling prescriptions to a pharmacy will no longer be done.  Prescription refills: These will be provided only during in-person appointments. No medications will be renewed without a "face-to-face" evaluation with your provider. Applies to all prescriptions.  NOTE: The following applies primarily to controlled substances (Opioid* Pain Medications).   Type of encounter (visit): For patients receiving controlled substances, face-to-face visits are required. (Not an option and not up to the patient.)  Patient's responsibilities: Pain Pills: Bring all pain pills to every appointment (except for procedure appointments). Pill Bottles: Bring pills in original pharmacy bottle. Bring bottle, even if empty. Always bring the bottle of the most recent fill.  Medication refills: You are responsible for knowing and keeping track of what medications you are taking and when is it that you will need a refill. The day before your appointment: write a list of all prescriptions that need to be refilled. The day of the appointment: give the list to the admitting nurse. Prescriptions will be written only during appointments. No prescriptions will be written on procedure days. If you forget a medication: it will not be "Called in", "Faxed", or  "electronically sent". You will need to get another appointment to get these prescribed. No early refills. Do not call asking to have your prescription filled early. Partial  or short prescriptions: Occasionally your pharmacy may not have enough pills to fill your prescription.  NEVER ACCEPT a partial fill or a prescription that is short of the total amount of pills that you were prescribed.  With controlled substances the law allows 72 hours for the pharmacy to complete the prescription.  If the prescription is not completed within 72 hours, the pharmacist will require a new prescription to be written. This means that you will be short on your medicine and we WILL NOT send another prescription to complete your original prescription.  Instead, request the pharmacy to send a carrier to a nearby branch to get enough medication to provide you with your full prescription. Prescription Accuracy: You are responsible for carefully inspecting your prescriptions before leaving our office. Have the discharge nurse carefully go over each prescription with you, before taking them home. Make sure that your name is accurately spelled, that your address is correct. Check the name and dose of your medication to make sure it is accurate. Check the number of pills, and the written instructions to make sure they are clear and accurate. Make sure that you are given enough medication to last until your next medication refill appointment. Taking Medication: Take medication as prescribed. When it comes to controlled substances, taking less pills or less frequently than prescribed is permitted and encouraged. Never take more pills than instructed. Never take the medication more frequently than prescribed.  Inform other Doctors: Always inform, all of your healthcare providers, of all the medications you take. Pain Medication from other Providers: You are not allowed to accept any additional pain medication from any other Doctor or  Healthcare provider. There are two exceptions to this rule. (see below) In the event that you require additional pain medication, you are responsible for notifying us, as stated below. Cough Medicine: Often these contain an opioid, such as codeine or hydrocodone. Never accept or take cough medicine containing these opioids if  you are already taking an opioid* medication. The combination may cause respiratory failure and death. Medication Agreement: You are responsible for carefully reading and following our Medication Agreement. This must be signed before receiving any prescriptions from our practice. Safely store a copy of your signed Agreement. Violations to the Agreement will result in no further prescriptions. (Additional copies of our Medication Agreement are available upon request.) Laws, Rules, & Regulations: All patients are expected to follow all Federal and [employer], Rules, [employer]. Ignorance of the Laws does not constitute a valid excuse.  Illegal drugs and Controlled Substances: The use of illegal substances (including, but not limited to marijuana and its derivatives) and/or the illegal use of any controlled substances is strictly prohibited. Violation of this rule may result in the immediate and permanent discontinuation of any and all prescriptions being written by our practice. The use of any illegal substances is prohibited. Adopted CDC guidelines & recommendations: Target dosing levels will be at or below 60 MME/day. Use of benzodiazepines** is not recommended.  Exceptions: There are only two exceptions to the rule of not receiving pain medications from other Healthcare Providers. Exception #1 (Emergencies): In the event of an emergency (i.e.: accident requiring emergency care), you are allowed to receive additional pain medication. However, you are responsible for: As soon as you are able, call our office [phone], at any time of the day or night, and leave a  message stating your name, the date and nature of the emergency, and the name and dose of the medication prescribed. In the event that your call is answered by a member of our staff, make sure to document and save the date, time, and the name of the person that took your information.  Exception #2 (Planned Surgery): In the event that you are scheduled by another doctor or dentist to have any type of surgery or procedure, you are allowed (for a period no longer than 30 days), to receive additional pain medication, for the acute post-op pain. However, in this case, you are responsible for picking up a copy of our "Post-op Pain Management for Surgeons" handout, and giving it to your surgeon or dentist. This document is available at our office, and does not require an appointment to obtain it. Simply go to our office during business hours (Monday-Thursday from 8:00 AM to 4:00 PM) (Friday 8:00 AM to 12:00 Noon) or if you have a scheduled appointment with [name], prior to your surgery, and ask for it by name. In addition, you are responsible for: calling our office [phone], at any time of the day or night, and leaving a message stating your name, name of your surgeon, type of surgery, and date of procedure or surgery. Failure to comply with your responsibilities may result in termination of therapy involving the controlled substances. Medication Agreement Violation. Following the above rules, including your responsibilities will help you in avoiding a Medication Agreement Violation ("Breaking your Pain Medication Contract").  Consequences:  Not following the above rules may result in permanent discontinuation of medication prescription therapy.  *Opioid medications include: morphine, codeine, oxycodone, oxymorphone, hydrocodone, hydromorphone, meperidine, tramadol, tapentadol, buprenorphine, fentanyl, methadone. **Benzodiazepine medications include: diazepam (Valium), alprazolam (Xanax), clonazepam (Klonopine),  lorazepam (Ativan), clorazepate (Tranxene), chlordiazepoxide (Librium), estazolam (Prosom), oxazepam (Serax), temazepam (Restoril), triazolam (Halcion) (Last updated: 12/11/2021) ______________________________________________________________________   ______________________________________________________________________  Medication Recommendations and Reminders  Applies to: All patients receiving prescriptions (written and/or electronic).  Medication Rules & Regulations: You are responsible for reading, knowing, and  following our "Medication Rules" document. These exist for your safety and that of others. They are not flexible and neither are we. Dismissing or ignoring them is an act of "non-compliance" that may result in complete and irreversible termination of such medication therapy. For safety reasons, "non-compliance" will not be tolerated. As with the [place] fundamental legal principle of "ignorance of the law is no defense", we will accept no excuses for not having read and knowing the content of documents provided to you by our practice.  Pharmacy of record:  Definition: This is the pharmacy where your electronic prescriptions will be sent.  We do not endorse any particular pharmacy. It is up to you and your insurance to decide what pharmacy to use.  We do not restrict you in your choice of pharmacy. However, once we write for your prescriptions, we will NOT be re-sending more prescriptions to fix restricted supply problems created by your pharmacy, or your insurance.  The pharmacy listed in the electronic medical record should be the one where you want electronic prescriptions to be sent. If you choose to change pharmacy, simply notify our nursing staff. Changes will be made only during your regular appointments and not over the phone.  Recommendations: Keep all of your pain medications in a safe place, under lock and key, even if you live alone. We will NOT replace lost, stolen, or  damaged medication. We do not accept "Police Reports" as proof of medications having been stolen. After you fill your prescription, take 1 week's worth of pills and put them away in a safe place. You should keep a separate, properly labeled bottle for this purpose. The remainder should be kept in the original bottle. Use this as your primary supply, until it runs out. Once it's gone, then you know that you have 1 week's worth of medicine, and it is time to come in for a prescription refill. If you do this correctly, it is unlikely that you will ever run out of medicine. To make sure that the above recommendation works, it is very important that you make sure your medication refill appointments are scheduled at least 1 week before you run out of medicine. To do this in an effective manner, make sure that you do not leave the office without scheduling your next medication management appointment. Always ask the nursing staff to show you in your prescription , when your medication will be running out. Then arrange for the receptionist to get you a return appointment, at least 7 days before you run out of medicine. Do not wait until you have 1 or 2 pills left, to come in. This is very poor planning and does not take into consideration that we may need to cancel appointments due to bad weather, sickness, or emergencies affecting our staff. DO NOT ACCEPT A "Partial Fill": If for any reason your pharmacy does not have enough pills/tablets to completely fill or refill your prescription, do not allow for a "partial fill". The law allows the pharmacy to complete that prescription within 72 hours, without requiring a new prescription. If they do not fill the rest of your prescription within those 72 hours, you will need a separate prescription to fill the remaining amount, which we will NOT provide. If the reason for the partial fill is your insurance, you will need to talk to the pharmacist about payment alternatives for  the remaining tablets, but again, DO NOT ACCEPT A PARTIAL FILL, unless you can trust your pharmacist to obtain  the remainder of the pills within 72 hours.  Prescription refills and/or changes in medication(s):  Prescription refills, and/or changes in dose or medication, will be conducted only during scheduled medication management appointments. (Applies to both, written and electronic prescriptions.) No refills on procedure days. No medication will be changed or started on procedure days. No changes, adjustments, and/or refills will be conducted on a procedure day. Doing so will interfere with the diagnostic portion of the procedure. No phone refills. No medications will be "called into the pharmacy". No Fax refills. No weekend refills. No Holliday refills. No after hours refills.  Remember:  Business hours are:  Monday to Thursday 8:00 AM to 4:00 PM Provider's Schedule: Milinda Pointer, MD - Appointments are:  Medication management: Monday and Wednesday 8:00 AM to 4:00 PM Procedure day: Tuesday and Thursday 7:30 AM to 4:00 PM Gillis Santa, MD - Appointments are:  Medication management: Tuesday and Thursday 8:00 AM to 4:00 PM Procedure day: Monday and Wednesday 7:30 AM to 4:00 PM (Last update: 12/11/2021) ______________________________________________________________________  ____________________________________________________________________________________________  Pharmacy Shortages of Pain Medication   Introduction Shockingly as it may seem, .  "No U.S. Supreme Court decision has ever interpreted the Constitution as guaranteeing a right to health care for all Americans." - https://huff.com/  "With respect to human rights, the [place] has no formally codified right to health, nor does it participate in a human rights treaty that specifies a right to health." - Scott J. Schweikart, JD, MBE  Situation By  now, most of our patients have had the experience of being told by their pharmacist that they do not have enough medication to cover their prescription. If you have not had this experience, just know that you soon will.  Problem There appears to be a shortage of these medications, either at the national level or locally. This is happening with all pharmacies. When there is not enough medication, patients are offered a partial fill and they are told that they will try to get the rest of the medicine for them at a later time. If they do not have enough for even a partial fill, the pharmacists are telling the patients to call us (the prescribing physicians) to request that we send another prescription to another pharmacy to get the medicine.   This reordering of a controlled substance creates documentation problems where additional paperwork needs to be created to explain why two prescriptions for the same period of time and the same medicine are being prescribed to the same patient. It also creates situations where the last appointment note does not accurately reflect when and what prescriptions were given to a patient. This leads to prescribing errors down the line, in subsequent follow-up visits.   [employer] of Pharmacy ([employer]) Research revealed that Surveyor, quantity .1806 (21 NCAC 46.1806) authorizes pharmacists to the transfer of prescriptions among pharmacies, and it sets forth procedural and recordkeeping requirements for doing so. However, this requires the pharmacist to complete the previously mentioned procedural paperwork to accomplish the transfer. As it turns out, it is much easier for them to have the prescribing physicians do the work.   Possible solutions 1. You can ask your physician to assist you in weaning yourself off these medications. 2. Ask your pharmacy if the medication is in stock, 3 days prior to your refill. 3. If you need a pharmacy change, let us know at your  medication management visit. Prescriptions that have already been electronically sent to a pharmacy will  not be re-sent to a different pharmacy if your pharmacy of record does not have it in stock. Proper stocking of medication is a pharmacy problem, not a prescriber problem. Work with your pharmacist to solve the problem. 4. Have the [facility] Assembly add a provision to the "STOP ACT" (the law that mandates how controlled substances are prescribed) where there is an exception to the electronic prescribing rule that states that in the event there are shortages of medications the physicians are allowed to use written prescriptions as opposed to electronic ones. This would allow patients to take their prescriptions to a different pharmacy that may have enough medication available to fill the prescription. The problem is that currently there is a law that does not allow for written prescriptions, with the exception of instances where the electronic medical record is down due to technical issues.  5. Have [name] Congress ease the pressure on pharmaceutical companies, allowing them to produce enough quantities of the medication to adequately supply the population. 6. Have pharmacies keep enough stocks of these medications to cover their client base.  7. Have the [facility] Assembly add a provision to the "STOP ACT" where they ease the regulations surrounding the transfer of controlled substances between pharmacies, so as to simplify the transfer of supplies. As an alternative, develop a system to allow patients to obtain the remainder of their prescription at another one of their pharmacies or at an associate pharmacy.   How this shortage will affect you.  Understand that this is a pharmacy supply problem, not a prescriber problem. Work with your pharmacy to solve it. The job of the prescriber is to evaluate and monitor the patient for the appropriate indications and use of these medicines. It is  not the job of the prescriber to supply the medication or to solve problems with that supply. The responsibility and the choice to obtain the medication resides on the patient. By law, supplying the medication is the job of the pharmacy. It is certainly not the job of the prescriber to solve supply problems.   Due to the above problems we are no longer taking patients to write for their pain medication. Future discussions with your physician may include potentially weaning medications or transitioning to alternatives.  We will be focusing primarily on interventional based pain management. We will continue to evaluate for appropriate indications and we may provide recommendations regarding medication, dose, and schedule, as well as monitoring recommendations, however, we will not be taking over the actual prescribing of these substances. On those patients where we are treating their chronic pain with interventional therapies, exceptions will be considered on a case by case basis. At this time, we will try to continue providing this supplemental service to those patients we have been managing in the past. However, as of August 1st, 2023, we no longer will be sending additional prescriptions to other pharmacies for the purpose of solving their supply problems. Once we send a prescription to a pharmacy, we will not be resending it again to another pharmacy to cover for their shortages.   What to do. Write as many letters as you can. Recruit the help of family members in writing these letters. Below are some of the places where you can write to make your voice heard. Let them know what the problem is and push them to look for solutions.   Search internet for: "[employer] find your legislators" NoseSwap.is  Search internet for: "[employer]  complaints" Starlas.fi  Search internet for: "[place] complaints" https://www.hernandez-brewer.com/.htm  Search internet for: "[facility] pharmacy complaints" Email [facility] Customer Relations woondaal.com.jsp?callType=store  Search internet for: Programme researcher, broadcasting/film/video customer service complaints" https://www.[facility].com/topic/marketing/contactus/contactus_customerservice.jsp  ____________________________________________________________________________________________  ____________________________________________________________________________________________  Drug Holidays (Slow)  What is a "Drug Holiday"? Drug Holiday: is the name given to the period of time during which a patient stops taking a medication(s) for the purpose of eliminating tolerance to the drug.  Benefits Improved effectiveness of opioids. Decreased opioid dose needed to achieve benefits. Improved pain with lesser dose.  What is tolerance? Tolerance: is the progressive decreased in effectiveness of a drug due to its repetitive use. With repetitive use, the body gets use to the medication and as a consequence, it loses its effectiveness. This is a common problem seen with opioid pain medications. As a result, a larger dose of the drug is needed to achieve the same effect that used to be obtained with a smaller dose.  How long should a "Drug Holiday" last? You should stay off of the pain medicine for at least 14 consecutive days. (2 weeks)  Should I stop the medicine "cold [place]"? No. You should always coordinate with your Pain Specialist so that he/she can provide you with the correct medication dose to make the transition as smoothly as possible.  How do I stop the medicine? Slowly. You will be instructed to decrease the daily amount of pills that you take by one (1) pill every seven (7) days. This is called a "slow downward taper" of your dose. For example: if you normally take four (4) pills per day, you will  be asked to drop this dose to three (3) pills per day for seven (7) days, then to two (2) pills per day for seven (7) days, then to one (1) per day for seven (7) days, and at the end of those last seven (7) days, this is when the "Drug Holiday" would start.   Will I have withdrawals? By doing a "slow downward taper" like this one, it is unlikely that you will experience any significant withdrawal symptoms. Typically, what triggers withdrawals is the sudden stop of a high dose opioid therapy. Withdrawals can usually be avoided by slowly decreasing the dose over a prolonged period of time. If you do not follow these instructions and decide to stop your medication abruptly, withdrawals may be possible.  What are withdrawals? Withdrawals: refers to the wide range of symptoms that occur after stopping or dramatically reducing opiate drugs after heavy and prolonged use. Withdrawal symptoms do not occur to patients that use low dose opioids, or those who take the medication sporadically. Contrary to benzodiazepine (example: Valium, Xanax, etc.) or alcohol withdrawals ("Delirium Tremens"), opioid withdrawals are not lethal. Withdrawals are the physical manifestation of the body getting rid of the excess receptors.  Expected Symptoms Early symptoms of withdrawal may include: Agitation Anxiety Muscle aches Increased tearing Insomnia Runny nose Sweating Yawning  Late symptoms of withdrawal may include: Abdominal cramping Diarrhea Dilated pupils Goose bumps Nausea Vomiting  Will I experience withdrawals? Due to the slow nature of the taper, it is very unlikely that you will experience any.  What is a slow taper? Taper: refers to the gradual decrease in dose.  (Last update: 09/08/2019) ____________________________________________________________________________________________   ____________________________________________________________________________________________  CBD (cannabidiol) &  Delta-8 (Delta-8 tetrahydrocannabinol) WARNING  Intro: Cannabidiol (CBD) and tetrahydrocannabinol (THC), are two natural compounds found in plants of the Cannabis genus. They can both be extracted  from hemp or cannabis. Hemp and cannabis come from the Cannabis sativa plant. Both compounds interact with your body's endocannabinoid system, but they have very different effects. CBD does not produce the high sensation associated with cannabis. Delta-8 tetrahydrocannabinol, also known as delta-8 THC, is a psychoactive substance found in the Cannabis sativa plant, of which marijuana and hemp are two varieties. THC is responsible for the high associated with the illicit use of marijuana.  Applicable to: All individuals currently taking or considering taking CBD (cannabidiol) and, more important, all patients taking opioid analgesic controlled substances (pain medication). (Example: oxycodone; oxymorphone; hydrocodone; hydromorphone; morphine; methadone; tramadol; tapentadol; fentanyl; buprenorphine; butorphanol; dextromethorphan; meperidine; codeine; etc.)  Legal status: CBD remains a Schedule I drug prohibited for any use. CBD is illegal with one exception. In the [place], CBD has a limited Transport planner (FDA) approval for the treatment of two specific types of epilepsy disorders. Only one CBD product has been approved by the FDA for this purpose: "Epidiolex". FDA is aware that some companies are marketing products containing cannabis and cannabis-derived compounds in ways that violate the [employer], Drug and Cosmetic Act [facility] Act) and that may put the health and safety of consumers at risk. The FDA, a Federal agency, has not enforced the CBD status since 2018. UPDATE: (04/06/2021) The Drug Enforcement Agency ([place]) issued a letter stating that "delta" cannabinoids, including Delta-8-THCO and Delta-9-THCO, synthetically derived from hemp do not qualify as hemp and will be viewed as Schedule I  drugs. (Schedule I drugs, substances, or chemicals are defined as drugs with no currently accepted medical use and a high potential for abuse. Some examples of Schedule I drugs are: heroin, lysergic acid diethylamide (LSD), marijuana (cannabis), 3,4-methylenedioxymethamphetamine (ecstasy), methaqualone, and peyote.) (https://jennings.com/)  Legality: Some manufacturers ship CBD products nationally, which is illegal. Often such products are sold online and are therefore available throughout the country. CBD is openly sold in head shops and health food stores in some states where such sales have not been explicitly legalized. Selling unapproved products with unsubstantiated therapeutic claims is not only a violation of the law, but also can put patients at risk, as these products have not been proven to be safe or effective. Federal illegality makes it difficult to conduct research on CBD.  Reference: "FDA Regulation of Cannabis and Cannabis-Derived Products, Including Cannabidiol (CBD)" - [employer]Art[employer].pt  Warning: CBD is not FDA approved and has not undergo the same manufacturing controls as prescription drugs.  This means that the purity and safety of available CBD may be questionable. Most of the time, despite manufacturer's claims, it is contaminated with THC (delta-9-tetrahydrocannabinol - the chemical in marijuana responsible for the "HIGH").  When this is the case, the [facility] contaminant will trigger a positive urine drug screen (UDS) test for Marijuana (carboxy-THC). Because a positive UDS for any illicit substance is a violation of our medication agreement, your opioid analgesics (pain medicine) may be permanently discontinued. The FDA recently put out a warning about 5 things that everyone should be aware of regarding Delta-8 THC: Delta-8 THC products have not been evaluated or approved by  the FDA for safe use and may be marketed in ways that put the public health at risk. The FDA has received adverse event reports involving delta-8 THC-containing products. Delta-8 THC has psychoactive and intoxicating effects. Delta-8 THC manufacturing often involve use of potentially harmful chemicals to create the concentrations of delta-8 THC claimed in the marketplace. The final delta-8 THC product may have potentially  harmful by-products (contaminants) due to the chemicals used in the process. Manufacturing of delta-8 THC products may occur in uncontrolled or unsanitary settings, which may lead to the presence of unsafe contaminants or other potentially harmful substances. Delta-8 THC products should be kept out of the reach of children and pets.  MORE ABOUT CBD  General Information: CBD was discovered in [birth year] and it is a derivative of the cannabis sativa genus plants (Marijuana and Hemp). It is one of the 113 identified substances found in Marijuana. It accounts for up to 40% of the plant's extract. As of 2018, preliminary clinical studies on CBD included research for the treatment of anxiety, movement disorders, and pain. CBD is available and consumed in multiple forms, including inhalation of smoke or vapor, as an aerosol spray, and by mouth. It may be supplied as an oil containing CBD, capsules, dried cannabis, or as a liquid solution. CBD is thought not to be as psychoactive as THC (delta-9-tetrahydrocannabinol - the chemical in marijuana responsible for the "HIGH"). Studies suggest that CBD may interact with different biological target receptors in the body, including cannabinoid and other neurotransmitter receptors. As of 2018 the mechanism of action for its biological effects has not been determined.  Side-effects  Adverse reactions: Dry mouth, diarrhea, decreased appetite, fatigue, drowsiness, malaise, weakness, sleep disturbances, and others.  Drug interactions: CBC may interact with other  medications such as blood-thinners. Because CBD causes drowsiness on its own, it also increases the drowsiness caused by other medications, including antihistamines (such as Benadryl), benzodiazepines (Xanax, Ativan, Valium), antipsychotics, antidepressants and opioids, as well as alcohol and supplements such as kava, melatonin and St. John's Wort. Be cautious with the following combinations:   Brivaracetam (Briviact) Brivaracetam is changed and broken down by the body. CBD might decrease how quickly the body breaks down brivaracetam. This might increase levels of brivaracetam in the body.  Caffeine Caffeine is changed and broken down by the body. CBD might decrease how quickly the body breaks down caffeine. This might increase levels of caffeine in the body.  Carbamazepine (Tegretol) Carbamazepine is changed and broken down by the body. CBD might decrease how quickly the body breaks down carbamazepine. This might increase levels of carbamazepine in the body and increase its side effects.  Citalopram (Celexa) Citalopram is changed and broken down by the body. CBD might decrease how quickly the body breaks down citalopram. This might increase levels of citalopram in the body and increase its side effects.  Clobazam (Onfi) Clobazam is changed and broken down by the liver. CBD might decrease how quickly the liver breaks down clobazam. This might increase the effects and side effects of clobazam.  Eslicarbazepine (Aptiom) Eslicarbazepine is changed and broken down by the body. CBD might decrease how quickly the body breaks down eslicarbazepine. This might increase levels of eslicarbazepine in the body by a small amount.  Everolimus (Zostress) Everolimus is changed and broken down by the body. CBD might decrease how quickly the body breaks down everolimus. This might increase levels of everolimus in the body.  Lithium Taking higher doses of CBD might increase levels of lithium. This can increase  the risk of lithium toxicity.  Medications changed by the liver (Cytochrome P450 1A1 (CYP1A1) substrates) Some medications are changed and broken down by the liver. CBD might change how quickly the liver breaks down these medications. This could change the effects and side effects of these medications.  Medications changed by the liver (Cytochrome P450 1A2 (CYP1A2) substrates) Some medications are  changed and broken down by the liver. CBD might change how quickly the liver breaks down these medications. This could change the effects and side effects of these medications.  Medications changed by the liver (Cytochrome P450 1B1 (CYP1B1) substrates) Some medications are changed and broken down by the liver. CBD might change how quickly the liver breaks down these medications. This could change the effects and side effects of these medications.  Medications changed by the liver (Cytochrome P450 2A6 (CYP2A6) substrates) Some medications are changed and broken down by the liver. CBD might change how quickly the liver breaks down these medications. This could change the effects and side effects of these medications.  Medications changed by the liver (Cytochrome P450 2B6 (CYP2B6) substrates) Some medications are changed and broken down by the liver. CBD might change how quickly the liver breaks down these medications. This could change the effects and side effects of these medications.  Medications changed by the liver (Cytochrome P450 2C19 (CYP2C19) substrates) Some medications are changed and broken down by the liver. CBD might change how quickly the liver breaks down these medications. This could change the effects and side effects of these medications.  Medications changed by the liver (Cytochrome P450 2C8 (CYP2C8) substrates) Some medications are changed and broken down by the liver. CBD might change how quickly the liver breaks down these medications. This could change the effects and side effects  of these medications.  Medications changed by the liver (Cytochrome P450 2C9 (CYP2C9) substrates) Some medications are changed and broken down by the liver. CBD might change how quickly the liver breaks down these medications. This could change the effects and side effects of these medications.  Medications changed by the liver (Cytochrome P450 2D6 (CYP2D6) substrates) Some medications are changed and broken down by the liver. CBD might change how quickly the liver breaks down these medications. This could change the effects and side effects of these medications.  Medications changed by the liver (Cytochrome P450 2E1 (CYP2E1) substrates) Some medications are changed and broken down by the liver. CBD might change how quickly the liver breaks down these medications. This could change the effects and side effects of these medications.  Medications changed by the liver (Cytochrome P450 3A4 (CYP3A4) substrates) Some medications are changed and broken down by the liver. CBD might change how quickly the liver breaks down these medications. This could change the effects and side effects of these medications.  Medications changed by the liver (Glucuronidated drugs) Some medications are changed and broken down by the liver. CBD might change how quickly the liver breaks down these medications. This could change the effects and side effects of these medications.  Medications that decrease the breakdown of other medications by the liver (Cytochrome P450 2C19 (CYP2C19) inhibitors) CBD is changed and broken down by the liver. Some drugs decrease how quickly the liver changes and breaks down CBD. This could change the effects and side effects of CBD.  Medications that decrease the breakdown of other medications in the liver (Cytochrome P450 3A4 (CYP3A4) inhibitors) CBD is changed and broken down by the liver. Some drugs decrease how quickly the liver changes and breaks down CBD. This could change the effects  and side effects of CBD.  Medications that increase breakdown of other medications by the liver (Cytochrome P450 3A4 (CYP3A4) inducers) CBD is changed and broken down by the liver. Some drugs increase how quickly the liver changes and breaks down CBD. This could change the effects and side effects of  CBD.  Medications that increase the breakdown of other medications by the liver (Cytochrome P450 2C19 (CYP2C19) inducers) CBD is changed and broken down by the liver. Some drugs increase how quickly the liver changes and breaks down CBD. This could change the effects and side effects of CBD.  Methadone (Dolophine) Methadone is broken down by the liver. CBD might decrease how quickly the liver breaks down methadone. Taking cannabidiol along with methadone might increase the effects and side effects of methadone.  Rufinamide (Banzel) Rufinamide is changed and broken down by the body. CBD might decrease how quickly the body breaks down rufinamide. This might increase levels of rufinamide in the body by a small amount.  Sedative medications (CNS depressants) CBD might cause sleepiness and slowed breathing. Some medications, called sedatives, can also cause sleepiness and slowed breathing. Taking CBD with sedative medications might cause breathing problems and/or too much sleepiness.  Sirolimus (Rapamune) Sirolimus is changed and broken down by the body. CBD might decrease how quickly the body breaks down sirolimus. This might increase levels of sirolimus in the body.  Stiripentol (Diacomit) Stiripentol is changed and broken down by the body. CBD might decrease how quickly the body breaks down stiripentol. This might increase levels of stiripentol in the body and increase its side effects.  Tacrolimus (Prograf) Tacrolimus is changed and broken down by the body. CBD might decrease how quickly the body breaks down tacrolimus. This might increase levels of tacrolimus in the body.  Tamoxifen  (Soltamox) Tamoxifen is changed and broken down by the body. CBD might affect how quickly the body breaks down tamoxifen. This might affect levels of tamoxifen in the body.  Topiramate (Topamax) Topiramate is changed and broken down by the body. CBD might decrease how quickly the body breaks down topiramate. This might increase levels of topiramate in the body by a small amount.  Valproate Valproic acid can cause liver injury. Taking cannabidiol with valproic acid might increase the chance of liver injury. CBD and/or valproic acid might need to be stopped, or the dose might need to be reduced.  Warfarin (Coumadin) CBD might increase levels of warfarin, which can increase the risk for bleeding. CBD and/or warfarin might need to be stopped, or the dose might need to be reduced.  Zonisamide Zonisamide is changed and broken down by the body. CBD might decrease how quickly the body breaks down zonisamide. This might increase levels of zonisamide in the body by a small amount. (Last update: 04/18/2021) ____________________________________________________________________________________________  Naloxone Nasal Spray What is this medication? NALOXONE (nal OX one) treats opioid overdose, which causes slow or shallow breathing, severe drowsiness, or trouble staying awake. Call emergency services after using this medication. You may need additional treatment. Naloxone works by reversing the effects of opioids. It belongs to a group of medications called opioid blockers. This medicine may be used for other purposes; ask your health care provider or pharmacist if you have questions. COMMON BRAND NAME(S): Kloxxado, Narcan What should I tell my care team before I take this medication? They need to know if you have any of these conditions: Heart disease Substance use disorder An unusual or allergic reaction to naloxone, other medications, foods, dyes, or preservatives Pregnant or trying to get  pregnant Breast-feeding How should I use this medication? This medication is for use in the nose. Lay the person on their back. Support their neck with your hand and allow the head to tilt back before giving the medication. The nasal spray should be given into  1 nostril. After giving the medication, move the person onto their side. Do not remove or test the nasal spray until ready to use. Get emergency medical help right away after giving the first dose of this medication, even if the person wakes up. You should be familiar with how to recognize the signs and symptoms of a narcotic overdose. If more doses are needed, give the additional dose in the other nostril. Talk to your care team about the use of this medication in children. While this medication may be prescribed for children as young as newborns for selected conditions, precautions do apply. Overdosage: If you think you have taken too much of this medicine contact a poison control center or emergency room at once. NOTE: This medicine is only for you. Do not share this medicine with others. What if I miss a dose? This does not apply. What may interact with this medication? This is only used during an emergency. No interactions are expected during emergency use. This list may not describe all possible interactions. Give your health care provider a list of all the medicines, herbs, non-prescription drugs, or dietary supplements you use. Also tell them if you smoke, drink alcohol, or use illegal drugs. Some items may interact with your medicine. What should I watch for while using this medication? Keep this medication ready for use in the case of an opioid overdose. Make sure that you have the phone number of your care team and local hospital ready. You may need to have additional doses of this medication. Each nasal spray contains a single dose. Some emergencies may require additional doses. After use, bring the treated person to the nearest  hospital or call 911. Make sure the treating care team knows that the person has received a dose of this medication. You will receive additional instructions on what to do during and after use of this medication before an emergency occurs. What side effects may I notice from receiving this medication? Side effects that you should report to your care team as soon as possible: Allergic reactions--skin rash, itching, hives, swelling of the face, lips, tongue, or throat Side effects that usually do not require medical attention (report these to your care team if they continue or are bothersome): Constipation Dryness or irritation inside the nose Headache Increase in blood pressure Muscle spasms Stuffy nose Toothache This list may not describe all possible side effects. Call your doctor for medical advice about side effects. You may report side effects to FDA at 1-800-FDA-1088. Where should I keep my medication? Keep out of the reach of children and pets. Store between 20 and 25 degrees C (68 and 77 degrees F). Do not freeze. Throw away any unused medication after the expiration date. Keep in original box until ready to use. NOTE: This sheet is a summary. It may not cover all possible information. If you have questions about this medicine, talk to your doctor, pharmacist, or health care provider.  2023 Elsevier/Gold Standard (2020-10-13 00:00:00)

## 2022-01-02 NOTE — Progress Notes (Signed)
Nursing Pain Medication Assessment:  Safety precautions to be maintained throughout the outpatient stay will include: orient to surroundings, keep bed in low position, maintain call bell within reach at all times, provide assistance with transfer out of bed and ambulation.  Medication Inspection Compliance: Pill count conducted under aseptic conditions, in front of the patient. Neither the pills nor the bottle was removed from the patient's sight at any time. Once count was completed pills were immediately returned to the patient in their original bottle.  Medication: Oxycodone IR Pill/Patch Count:  116 of 180 pills remain Pill/Patch Appearance: Markings consistent with prescribed medication Bottle Appearance: Standard pharmacy container. Clearly labeled. Filled Date: 10 / 30 / 2023 Last Medication intake:  TodaySafety precautions to be maintained throughout the outpatient stay will include: orient to surroundings, keep bed in low position, maintain call bell within reach at all times, provide assistance with transfer out of bed and ambulation.

## 2022-01-03 ENCOUNTER — Telehealth: Payer: Medicare Other | Admitting: Pain Medicine

## 2022-04-29 ENCOUNTER — Ambulatory Visit (HOSPITAL_BASED_OUTPATIENT_CLINIC_OR_DEPARTMENT_OTHER): Payer: Medicare Other | Admitting: Pain Medicine

## 2022-04-29 DIAGNOSIS — Z91199 Patient's noncompliance with other medical treatment and regimen due to unspecified reason: Secondary | ICD-10-CM | POA: Insufficient documentation

## 2022-04-29 NOTE — Progress Notes (Signed)
(  04/29/2022) NO-SHOW to medication management appointment.

## 2022-04-29 NOTE — Patient Instructions (Signed)
____________________________________________________________________________________________  Opioid Pain Medication Update  To: All patients taking opioid pain medications. (I.e.: hydrocodone, hydromorphone, oxycodone, oxymorphone, morphine, codeine, methadone, tapentadol, tramadol, buprenorphine, fentanyl, etc.)  Re: Updated review of side effects and adverse reactions of opioid analgesics, as well as new information about long term effects of this class of medications.  Direct risks of long-term opioid therapy are not limited to opioid addiction and overdose. Potential medical risks include serious fractures, breathing problems during sleep, hyperalgesia, immunosuppression, chronic constipation, bowel obstruction, myocardial infarction, and tooth decay secondary to xerostomia.  Unpredictable adverse effects that can occur even if you take your medication correctly: Cognitive impairment, respiratory depression, and death. Most people think that if they take their medication "correctly", and "as instructed", that they will be safe. Nothing could be farther from the truth. In reality, a significant amount of recorded deaths associated with the use of opioids has occurred in individuals that had taken the medication for a long time, and were taking their medication correctly. The following are examples of how this can happen: Patient taking his/her medication for a long time, as instructed, without any side effects, is given a certain antibiotic or another unrelated medication, which in turn triggers a "Drug-to-drug interaction" leading to disorientation, cognitive impairment, impaired reflexes, respiratory depression or an untoward event leading to serious bodily harm or injury, including death.  Patient taking his/her medication for a long time, as instructed, without any side effects, develops an acute impairment of liver and/or kidney function. This will lead to a rapid inability of the body to  breakdown and eliminate their pain medication, which will result in effects similar to an "overdose", but with the same medicine and dose that they had always taken. This again may lead to disorientation, cognitive impairment, impaired reflexes, respiratory depression or an untoward event leading to serious bodily harm or injury, including death.  A similar problem will occur with patients as they grow older and their liver and kidney function begins to decrease as part of the aging process.  Background information: Historically, the original case for using long-term opioid therapy to treat chronic noncancer pain was based on safety assumptions that subsequent experience has called into question. In 1996, the American Pain Society and the [place] [employer] issued a consensus statement supporting long-term opioid therapy. This statement acknowledged the dangers of opioid prescribing but concluded that the risk for addiction was low; respiratory depression induced by opioids was short-lived, occurred mainly in opioid-naive patients, and was antagonized by pain; tolerance was not a common problem; and efforts to control diversion should not constrain opioid prescribing. This has now proven to be wrong. Experience regarding the risks for opioid addiction, misuse, and overdose in community practice has failed to support these assumptions.  According to the Centers for Disease Control and Prevention, fatal overdoses involving opioid analgesics have increased sharply over the past decade. Currently, more than 96,700 people die from drug overdoses every year. Opioids are a factor in 7 out of every 10 overdose deaths. Deaths from drug overdose have surpassed motor vehicle accidents as the leading cause of death for individuals between the ages of 3 and 36.  Clinical data suggest that neuroendocrine dysfunction may be very common in both men and women, potentially causing hypogonadism, erectile  dysfunction, infertility, decreased libido, osteoporosis, and depression. Recent studies linked higher opioid dose to increased opioid-related mortality. Controlled observational studies reported that long-term opioid therapy may be associated with increased risk for cardiovascular events. Subsequent meta-analysis concluded  that the safety of long-term opioid therapy in elderly patients has not been proven.   Side Effects and adverse reactions: Common side effects: Drowsiness (sedation). Dizziness. Nausea and vomiting. Constipation. Physical dependence -- Dependence often manifests with withdrawal symptoms when opioids are discontinued or decreased. Tolerance -- As you take repeated doses of opioids, you require increased medication to experience the same effect of pain relief. Respiratory depression -- This can occur in healthy people, especially with higher doses. However, people with COPD, asthma or other lung conditions may be even more susceptible to fatal respiratory impairment.  Uncommon side effects: An increased sensitivity to feeling pain and extreme response to pain (hyperalgesia). Chronic use of opioids can lead to this. Delayed gastric emptying (the process by which the contents of your stomach are moved into your small intestine). Muscle rigidity. Immune system and hormonal dysfunction. Quick, involuntary muscle jerks (myoclonus). Arrhythmia. Itchy skin (pruritus). Dry mouth (xerostomia).  Long-term side effects: Chronic constipation. Sleep-disordered breathing (SDB). Increased risk of bone fractures. Hypothalamic-pituitary-adrenal dysregulation. Increased risk of overdose.  RISKS: Fractures and Falls:  Opioids increase the risk and incidence of falls. This is of particular importance in elderly patients.  Endocrine System:  Long-term administration is associated with endocrine abnormalities (endocrinopathies). (Also known as Opioid-induced Endocrinopathy) Influences  on both the hypothalamic-pituitary-adrenal axis?and the hypothalamic-pituitary-gonadal axis have been demonstrated with consequent hypogonadism and adrenal insufficiency in both sexes. Hypogonadism and decreased levels of dehydroepiandrosterone sulfate have been reported in men and women. Endocrine effects include: Amenorrhoea in women (abnormal absence of menstruation) Reduced libido in both sexes Decreased sexual function Erectile dysfunction in men Hypogonadisms (decreased testicular function with shrinkage of testicles) Infertility Depression and fatigue Loss of muscle mass Anxiety Depression Immune suppression Hyperalgesia Weight gain Anemia Osteoporosis Patients (particularly women of childbearing age) should avoid opioids. There is insufficient evidence to recommend routine monitoring of asymptomatic patients taking opioids in the long-term for hormonal deficiencies.  Immune System: Human studies have demonstrated that opioids have an immunomodulating effect. These effects are mediated via opioid receptors both on immune effector cells and in the central nervous system. Opioids have been demonstrated to have adverse effects on antimicrobial response and anti-tumour surveillance. Buprenorphine has been demonstrated to have no impact on immune function.  Opioid Induced Hyperalgesia: Human studies have demonstrated that prolonged use of opioids can lead to a state of abnormal pain sensitivity, sometimes called opioid induced hyperalgesia (OIH). Opioid induced hyperalgesia is not usually seen in the absence of tolerance to opioid analgesia. Clinically, hyperalgesia may be diagnosed if the patient on long-term opioid therapy presents with increased pain. This might be qualitatively and anatomically distinct from pain related to disease progression or to breakthrough pain resulting from development of opioid tolerance. Pain associated with hyperalgesia tends to be more diffuse than the  pre-existing pain and less defined in quality. Management of opioid induced hyperalgesia requires opioid dose reduction.  Cancer: Chronic opioid therapy has been associated with an increased risk of cancer among noncancer patients with chronic pain. This association was more evident in chronic strong opioid users. Chronic opioid consumption causes significant pathological changes in the small intestine and colon. Epidemiological studies have found that there is a link between opium dependence and initiation of gastrointestinal cancers. Cancer is the second leading cause of death after cardiovascular disease. Chronic use of opioids can cause multiple conditions such as GERD, immunosuppression and renal damage as well as carcinogenic effects, which are associated with the incidence of cancers.   Mortality: Long-term opioid use  has been associated with increased mortality among patients with chronic non-cancer pain (CNCP).  Prescription of long-acting opioids for chronic noncancer pain was associated with a significantly increased risk of all-cause mortality, including deaths from causes other than overdose.  Reference: Von Korff M, Kolodny A, Deyo RA, Chou R. Long-term opioid therapy reconsidered. Ann Intern Med. 2011 Sep 6;155(5):325-8. doi: 10.7326/[id number]. PMID: [id number]; PMCIDXX:1631110. Morley Kos, Hayward RA, Dunn KM, [place] KP. Risk of adverse events in patients prescribed long-term opioids: A cohort study in the [place] Clinical Practice Research Datal[employer]. Eur J Pain. 2019 May;23(5):908-922. doi: 10.1002/ejp.1357. Epub 2019 Jan 31. PMID: [id number]. Colameco S, Coren JS, Ciervo CA. Continuous opioid treatment for chronic noncancer pain: a time for moderation in prescribing. Postgrad Med. 2009 Jul;121(4):61-6. doi: 10.3810/pgm.2009.07.2032. PMID: [id number]. Heywood Bene RN, [place] SD, Blazina I, Rosalio Loud, Bougatsos C, Deyo RA. The  effectiveness and risks of long-term opioid therapy for chronic pain: a systematic review for a [employer] of Health Pathways to [employer]. Ann Intern Med. 2015 Feb 17;162(4):276-86. doi: [id number]. PMID: [id number]. Marjory Sneddon [facility], [place] DM. NCHS Data Brief No. 22. [place]: Centers for Disease Control and Prevention; 2009. Sep, Increase in Fatal Poisonings Involving Opioid Analgesics in the [place], 1999-2006. Song IA, Choi HR, Oh TK. Long-term opioid use and mortality in patients with chronic non-cancer pain: Ten-year follow-up study in [place] from 2010 through 2019. EClinicalMedicine. 2022 Jul 18;51:101558. doi: 10.1016/j.eclinm.2022.UB:[id number]. PMID: [id number]; PMCIDOX:8550940. Huser, W., Schubert, T., Vogelmann, T. et al. All-cause mortality in patients with long-term opioid therapy compared with non-opioid analgesics for chronic non-cancer pain: a database study. [place] Med 18, 162 (2020). [email or web address] Rashidian H, Roxy Cedar, Malekzadeh R, Haghdoost AA. An Ecological Study of the Association between Opiate Use and Incidence of Cancers. Addict Health. 2016 Fall;8(4):252-260. PMID: [id number]; PMCIDQI:9185013.  Our Goal: Our goal is to control your pain with means other than the use of opioid pain medications.  Our Recommendation: Talk to your physician about coming off of these medications. We can assist you with the tapering down and stopping these medicines. Based on the new information, even if you cannot completely stop the medication, a decrease in the dose may be associated with a lesser risk. Ask for other means of controlling the pain. Decrease or eliminate those factors that significantly contribute to your pain such as smoking, obesity, and a diet heavily tilted towards "inflammatory" nutrients.  Last Updated: 04/17/2022    ____________________________________________________________________________________________     ____________________________________________________________________________________________  Patient Information update  To: All of our patients.  Re: Name change.  It has been made official that our current name, "[facility]"   will soon be changed to "[facility]".   The purpose of this change is to eliminate any confusion created by the concept of our practice being a "Medication Management Pain Clinic". In the past this has led to the misconception that we treat pain primarily by the use of prescription medications.  Nothing can be farther from the truth.   Understanding PAIN MANAGEMENT: To further understand what our practice does, you first have to understand that "Pain Management" is a subspecialty that requires additional training once a physician has completed their specialty training, which can be in either Anesthesia, Neurology, Psychiatry, or Physical Medicine and Rehabilitation (PMR). Each one of these contributes to the final approach taken by each physician to  the management of their patient's pain. To be a "Pain Management Specialist" you must have first completed one of the specialty trainings below.  Anesthesiologists - trained in clinical pharmacology and interventional techniques such as nerve blockade and regional as well as central neuroanatomy. They are trained to block pain before, during, and after surgical interventions.  Neurologists - trained in the diagnosis and pharmacological treatment of complex neurological conditions, such as Multiple Sclerosis, Parkinson's, spinal cord injuries, and other systemic conditions that may be associated with symptoms that may include but are not limited to pain. They tend to rely primarily on the treatment of chronic pain  using prescription medications.  Psychiatrist - trained in conditions affecting the psychosocial wellbeing of patients including but not limited to depression, anxiety, schizophrenia, personality disorders, addiction, and other substance use disorders that may be associated with chronic pain. They tend to rely primarily on the treatment of chronic pain using prescription medications.   Physical Medicine and Rehabilitation (PMR) physicians, also known as physiatrists - trained to treat a wide variety of medical conditions affecting the brain, spinal cord, nerves, bones, joints, ligaments, muscles, and tendons. Their training is primarily aimed at treating patients that have suffered injuries that have caused severe physical impairment. Their training is primarily aimed at the physical therapy and rehabilitation of those patients. They may also work alongside orthopedic surgeons or neurosurgeons using their expertise in assisting surgical patients to recover after their surgeries.  INTERVENTIONAL PAIN MANAGEMENT is sub-subspecialty of Pain Management.  Our physicians are Board-certified in Anesthesia, Pain Management, and Interventional Pain Management.  This meaning that not only have they been trained and Board-certified in their specialty of Anesthesia, and subspecialty of Pain Management, but they have also received further training in the sub-subspecialty of Interventional Pain Management, in order to become Board-certified as INTERVENTIONAL PAIN MANAGEMENT SPECIALIST.    Mission: Our goal is to use our skills in  [place] as alternatives to the chronic use of prescription opioid medications for the treatment of pain. To make this more clear, we have changed our name to reflect what we do and offer. We will continue to offer medication management assessment and recommendations, but we will not be taking over any patient's medication  management.  ____________________________________________________________________________________________     ____________________________________________________________________________________________  National Pain Medication Shortage  The [place] is experiencing worsening drug shortages. These have had a negative widespread effect on patient care and treatment. Not expected to improve any time soon. Predicted to last past 2029.   Drug shortage list (generic names) Oxycodone IR Oxycodone/APAP Oxymorphone IR Hydromorphone Hydrocodone/APAP Morphine  Where is the problem?  Manufacturing and supply level.  Will this shortage affect you?  Only if you take any of the above pain medications.  How? You may be unable to fill your prescription.  Your pharmacist may offer a "partial fill" of your prescription. (Warning: Do not accept partial fills.) Prescriptions partially filled cannot be transferred to another pharmacy. Read our Medication Rules and Regulation. Depending on how much medicine you are dependent on, you may experience withdrawals when unable to get the medication.  Recommendations: Consider ending your dependence on opioid pain medications. Ask your pain specialist to assist you with the process. Consider switching to a medication currently not in shortage, such as Buprenorphine. Talk to your pain specialist about this option. Consider decreasing your pain medication requirements by managing tolerance thru "Drug Holidays". This may help minimize withdrawals, should you run out of medicine. Control your pain thru  the use of non-pharmacological interventional therapies.   Your prescriber: Prescribers cannot be blamed for shortages. Medication manufacturing and supply issues cannot be fixed by the prescriber.   NOTE: The prescriber is not responsible for supplying the medication, or solving supply issues. Work with your pharmacist to solve it. The patient is responsible for  the decision to take or continue taking the medication and for identifying and securing a legal supply source. By law, supplying the medication is the job and responsibility of the pharmacy. The prescriber is responsible for the evaluation, monitoring, and prescribing of these medications.   Prescribers will NOT: Re-issue prescriptions that have been partially filled. Re-issue prescriptions already sent to a pharmacy.  Re-send prescriptions to a different pharmacy because yours did not have your medication. Ask pharmacist to order more medicine or transfer the prescription to another pharmacy. (Read below.)  New 2023 regulation: "October 19, 2021 Revised Regulation Allows DEA-Registered Pharmacies to Transfer Electronic Prescriptions at a Patient's Request [place] Patients now have the ability to request their electronic prescription be transferred to another pharmacy without having to go back to their practitioner to initiate the request. This revised regulation went into effect on Monday, October 15, 2021.     At a patient's request, a DEA-registered retail pharmacy can now transfer an electronic prescription for a controlled substance (schedules II-V) to another DEA-registered retail pharmacy. Prior to this change, patients would have to go through their practitioner to cancel their prescription and have it re-issued to a different pharmacy. The process was taxing and time consuming for both patients and practitioners.    The Drug Enforcement Administration [facility]) published its intent to revise the process for transferring electronic prescriptions on January 07, 2020.  The final rule was published in the federal register on September 13, 2021 and went into effect 30 days later.  Under the final rule, a prescription can only be transferred once between pharmacies, and only if allowed under existing state or other applicable law. The prescription must  remain in its electronic form; may not be altered in any way; and the transfer must be communicated directly between two licensed pharmacists. It's important to note, any authorized refills transfer with the original prescription, which means the entire prescription will be filled at the same pharmacy".  Reference: [email or web address] [facility] website announcement)  [email or web address] ([place])   [employer] / Vol. 88, No. 143 / Thursday, September 13, 2021 / Rules and Regulations DEPARTMENT OF JUSTICE  Drug Enforcement Administration  21 CFR Part 1306  [Docket No. [id number]]  RIN [id number] Transfer of Electronic Prescriptions for Schedules II-V Controlled Substances Between Pharmacies for Initial Filling  ____________________________________________________________________________________________     ____________________________________________________________________________________________  Transfer of Pain Medication between Pharmacies  Re: 2023 DEA Clarification on existing regulation  Published on DEA Website: October 19, 2021  Title: Revised Regulation Allows DEA-Registered Pharmacies to Conservator, museum/gallery Prescriptions at a Patient's Request [place]  "Patients now have the ability to request their electronic prescription be transferred to another pharmacy without having to go back to their practitioner to initiate the request. This revised regulation went into effect on Monday, October 15, 2021.     At a patient's request, a DEA-registered retail pharmacy can now transfer an electronic prescription for a controlled substance (schedules II-V) to another DEA-registered retail pharmacy. Prior to this change, patients would have to go through their practitioner to  cancel their prescription  and have it re-issued to a different pharmacy. The process was taxing and time consuming for both patients and practitioners.    The Drug Enforcement Administration [facility]) published its intent to revise the process for transferring electronic prescriptions on January 07, 2020.  The final rule was published in the federal register on September 13, 2021 and went into effect 30 days later.  Under the final rule, a prescription can only be transferred once between pharmacies, and only if allowed under existing state or other applicable law. The prescription must remain in its electronic form; may not be altered in any way; and the transfer must be communicated directly between two licensed pharmacists. It's important to note, any authorized refills transfer with the original prescription, which means the entire prescription will be filled at the same pharmacy."    REFERENCES: 1. DEA website announcement [email or web address]  2. Department of Justice website  [email or web address].pdf  3. DEPARTMENT OF JUSTICE Drug Enforcement Administration 21 CFR Part 1306 [Docket No. [id number]-637] RIN [id number] "Transfer of Electronic Prescriptions for Schedules II-V Controlled Substances Between Pharmacies for Initial Filling"  ____________________________________________________________________________________________     _______________________________________________________________________  Medication Rules  Purpose: To inform patients, and their family members, of our medication rules and regulations.  Applies to: All patients receiving prescriptions from our practice (written or electronic).  Pharmacy of record: This is the pharmacy where your electronic prescriptions will be sent. Make sure we have the correct one.  Electronic prescriptions: In  compliance with the [place] (STOP) Act of 2017 (Session Lanny Cramp [phone]), effective February 18, 2018, all controlled substances must be electronically prescribed. Written prescriptions, faxing, or calling prescriptions to a pharmacy will no longer be done.  Prescription refills: These will be provided only during in-person appointments. No medications will be renewed without a "face-to-face" evaluation with your provider. Applies to all prescriptions.  NOTE: The following applies primarily to controlled substances (Opioid* Pain Medications).   Type of encounter (visit): For patients receiving controlled substances, face-to-face visits are required. (Not an option and not up to the patient.)  Patient's responsibilities: Pain Pills: Bring all pain pills to every appointment (except for procedure appointments). Pill Bottles: Bring pills in original pharmacy bottle. Bring bottle, even if empty. Always bring the bottle of the most recent fill.  Medication refills: You are responsible for knowing and keeping track of what medications you are taking and when is it that you will need a refill. The day before your appointment: write a list of all prescriptions that need to be refilled. The day of the appointment: give the list to the admitting nurse. Prescriptions will be written only during appointments. No prescriptions will be written on procedure days. If you forget a medication: it will not be "Called in", "Faxed", or "electronically sent". You will need to get another appointment to get these prescribed. No early refills. Do not call asking to have your prescription filled early. Partial  or short prescriptions: Occasionally your pharmacy may not have enough pills to fill your prescription.  NEVER ACCEPT a partial fill or a prescription that is short of the total amount of pills that you were prescribed.  With controlled substances the law allows 72 hours for  the pharmacy to complete the prescription.  If the prescription is not completed within 72 hours, the pharmacist will require a new prescription to be written. This means that you will be short on your medicine and we WILL NOT send another prescription to complete your original  prescription.  Instead, request the pharmacy to send a carrier to a nearby branch to get enough medication to provide you with your full prescription. Prescription Accuracy: You are responsible for carefully inspecting your prescriptions before leaving our office. Have the discharge nurse carefully go over each prescription with you, before taking them home. Make sure that your name is accurately spelled, that your address is correct. Check the name and dose of your medication to make sure it is accurate. Check the number of pills, and the written instructions to make sure they are clear and accurate. Make sure that you are given enough medication to last until your next medication refill appointment. Taking Medication: Take medication as prescribed. When it comes to controlled substances, taking less pills or less frequently than prescribed is permitted and encouraged. Never take more pills than instructed. Never take the medication more frequently than prescribed.  Inform other Doctors: Always inform, all of your healthcare providers, of all the medications you take. Pain Medication from other Providers: You are not allowed to accept any additional pain medication from any other Doctor or Healthcare provider. There are two exceptions to this rule. (see below) In the event that you require additional pain medication, you are responsible for notifying us, as stated below. Cough Medicine: Often these contain an opioid, such as codeine or hydrocodone. Never accept or take cough medicine containing these opioids if you are already taking an opioid* medication. The combination may cause respiratory failure and death. Medication Agreement:  You are responsible for carefully reading and following our Medication Agreement. This must be signed before receiving any prescriptions from our practice. Safely store a copy of your signed Agreement. Violations to the Agreement will result in no further prescriptions. (Additional copies of our Medication Agreement are available upon request.) Laws, Rules, & Regulations: All patients are expected to follow all Federal and [employer], [employer], Rules, [facility]. Ignorance of the Laws does not constitute a valid excuse.  Illegal drugs and Controlled Substances: The use of illegal substances (including, but not limited to marijuana and its derivatives) and/or the illegal use of any controlled substances is strictly prohibited. Violation of this rule may result in the immediate and permanent discontinuation of any and all prescriptions being written by our practice. The use of any illegal substances is prohibited. Adopted CDC guidelines & recommendations: Target dosing levels will be at or below 60 MME/day. Use of benzodiazepines** is not recommended.  Exceptions: There are only two exceptions to the rule of not receiving pain medications from other Healthcare Providers. Exception #1 (Emergencies): In the event of an emergency (i.e.: accident requiring emergency care), you are allowed to receive additional pain medication. However, you are responsible for: As soon as you are able, call our office [phone], at any time of the day or night, and leave a message stating your name, the date and nature of the emergency, and the name and dose of the medication prescribed. In the event that your call is answered by a member of our staff, make sure to document and save the date, time, and the name of the person that took your information.  Exception #2 (Planned Surgery): In the event that you are scheduled by another doctor or dentist to have any type of surgery or procedure, you are allowed (for a period no  longer than 30 days), to receive additional pain medication, for the acute post-op pain. However, in this case, you are responsible for picking up a copy of  our "Post-op Pain Management for Surgeons" handout, and giving it to your surgeon or dentist. This document is available at our office, and does not require an appointment to obtain it. Simply go to our office during business hours (Monday-Thursday from 8:00 AM to 4:00 PM) (Friday 8:00 AM to 12:00 Noon) or if you have a scheduled appointment with [name], prior to your surgery, and ask for it by name. In addition, you are responsible for: calling our office [phone], at any time of the day or night, and leaving a message stating your name, name of your surgeon, type of surgery, and date of procedure or surgery. Failure to comply with your responsibilities may result in termination of therapy involving the controlled substances. Medication Agreement Violation. Following the above rules, including your responsibilities will help you in avoiding a Medication Agreement Violation ("Breaking your Pain Medication Contract").  Consequences:  Not following the above rules may result in permanent discontinuation of medication prescription therapy.  *Opioid medications include: morphine, codeine, oxycodone, oxymorphone, hydrocodone, hydromorphone, meperidine, tramadol, tapentadol, buprenorphine, fentanyl, methadone. **Benzodiazepine medications include: diazepam (Valium), alprazolam (Xanax), clonazepam (Klonopine), lorazepam (Ativan), clorazepate (Tranxene), chlordiazepoxide (Librium), estazolam (Prosom), oxazepam (Serax), temazepam (Restoril), triazolam (Halcion) (Last updated: 12/11/2021) ______________________________________________________________________    ______________________________________________________________________  Medication Recommendations and Reminders  Applies to: All patients receiving prescriptions (written and/or  electronic).  Medication Rules & Regulations: You are responsible for reading, knowing, and following our "Medication Rules" document. These exist for your safety and that of others. They are not flexible and neither are we. Dismissing or ignoring them is an act of "non-compliance" that may result in complete and irreversible termination of such medication therapy. For safety reasons, "non-compliance" will not be tolerated. As with the [place] fundamental legal principle of "ignorance of the law is no defense", we will accept no excuses for not having read and knowing the content of documents provided to you by our practice.  Pharmacy of record:  Definition: This is the pharmacy where your electronic prescriptions will be sent.  We do not endorse any particular pharmacy. It is up to you and your insurance to decide what pharmacy to use.  We do not restrict you in your choice of pharmacy. However, once we write for your prescriptions, we will NOT be re-sending more prescriptions to fix restricted supply problems created by your pharmacy, or your insurance.  The pharmacy listed in the electronic medical record should be the one where you want electronic prescriptions to be sent. If you choose to change pharmacy, simply notify our nursing staff. Changes will be made only during your regular appointments and not over the phone.  Recommendations: Keep all of your pain medications in a safe place, under lock and key, even if you live alone. We will NOT replace lost, stolen, or damaged medication. We do not accept "Police Reports" as proof of medications having been stolen. After you fill your prescription, take 1 week's worth of pills and put them away in a safe place. You should keep a separate, properly labeled bottle for this purpose. The remainder should be kept in the original bottle. Use this as your primary supply, until it runs out. Once it's gone, then you know that you have 1 week's worth of medicine,  and it is time to come in for a prescription refill. If you do this correctly, it is unlikely that you will ever run out of medicine. To make sure that the above recommendation works, it is very important that you make  sure your medication refill appointments are scheduled at least 1 week before you run out of medicine. To do this in an effective manner, make sure that you do not leave the office without scheduling your next medication management appointment. Always ask the nursing staff to show you in your prescription , when your medication will be running out. Then arrange for the receptionist to get you a return appointment, at least 7 days before you run out of medicine. Do not wait until you have 1 or 2 pills left, to come in. This is very poor planning and does not take into consideration that we may need to cancel appointments due to bad weather, sickness, or emergencies affecting our staff. DO NOT ACCEPT A "Partial Fill": If for any reason your pharmacy does not have enough pills/tablets to completely fill or refill your prescription, do not allow for a "partial fill". The law allows the pharmacy to complete that prescription within 72 hours, without requiring a new prescription. If they do not fill the rest of your prescription within those 72 hours, you will need a separate prescription to fill the remaining amount, which we will NOT provide. If the reason for the partial fill is your insurance, you will need to talk to the pharmacist about payment alternatives for the remaining tablets, but again, DO NOT ACCEPT A PARTIAL FILL, unless you can trust your pharmacist to obtain the remainder of the pills within 72 hours.  Prescription refills and/or changes in medication(s):  Prescription refills, and/or changes in dose or medication, will be conducted only during scheduled medication management appointments. (Applies to both, written and electronic prescriptions.) No refills on procedure days. No  medication will be changed or started on procedure days. No changes, adjustments, and/or refills will be conducted on a procedure day. Doing so will interfere with the diagnostic portion of the procedure. No phone refills. No medications will be "called into the pharmacy". No Fax refills. No weekend refills. No Holliday refills. No after hours refills.  Remember:  Business hours are:  Monday to Thursday 8:00 AM to 4:00 PM Provider's Schedule: Milinda Pointer, MD - Appointments are:  Medication management: Monday and Wednesday 8:00 AM to 4:00 PM Procedure day: Tuesday and Thursday 7:30 AM to 4:00 PM Gillis Santa, MD - Appointments are:  Medication management: Tuesday and Thursday 8:00 AM to 4:00 PM Procedure day: Monday and Wednesday 7:30 AM to 4:00 PM (Last update: 12/11/2021) ______________________________________________________________________    ____________________________________________________________________________________________  Drug Holidays  What is a "Drug Holiday"? Drug Holiday: is the name given to the process of slowly tapering down and temporarily stopping the pain medication for the purpose of decreasing or eliminating tolerance to the drug.  Benefits Improved effectiveness Decreased required effective dose Improved pain control End dependence on high dose therapy Decrease cost of therapy Uncovering "opioid-induced hyperalgesia". (OIH)  What is "opioid hyperalgesia"? It is a paradoxical increase in pain caused by exposure to opioids. Stopping the opioid pain medication, contrary to the expected, it actually decreases or completely eliminates the pain. Ref.: "A comprehensive review of opioid-induced hyperalgesia". Brion Aliment, et.al. Pain Physician. 2011 Mar-Apr;14(2):145-61.  What is tolerance? Tolerance: the progressive loss of effectiveness of a pain medicine due to repetitive use. A common problem of opioid pain medications.  How long should a "Drug  Holiday" last? Effectiveness depends on the patient staying off all opioid pain medicines for a minimum of 14 consecutive days. (2 weeks)  How about just taking less of the medicine? Does not  work. Will not accomplish goal of eliminating the excess receptors.  How about switching to a different pain medicine? (AKA. "Opioid rotation") Does not work. Creates the illusion of effectiveness by taking advantage of inaccurate equivalent dose calculations between different opioids. -This "technique" was promoted by studies funded by [employer], such as [employer], creators of "OxyContin".  Can I stop the medicine "cold [place]"? Depends. You should always coordinate with your Pain Specialist to make the transition as smoothly as possible. Avoid stopping the medicine abruptly without consulting. We recommend a "slow taper".  What is a slow taper? Taper: refers to the gradual decrease in dose.   How do I stop/taper the dose? Slowly. Decrease the daily amount of pills that you take by one (1) pill every seven (7) days. This is called a "slow downward taper". Example: if you normally take four (4) pills per day, drop it to three (3) pills per day for seven (7) days, then to two (2) pills per day for seven (7) days, then to one (1) per day for seven (7) days, and then stop the medicine. The 14 day "Drug Holiday" starts on the first day without medicine.   Will I experience withdrawals? Unlikely with a slow taper.  What triggers withdrawals? Withdrawals are triggered by the sudden/abrupt stop of high dose opioids. Withdrawals can be avoided by slowly decreasing the dose over a prolonged period of time.  What are withdrawals? Symptoms associated with sudden/abrupt reduction/stopping of high-dose, long-term use of pain medication. Withdrawal are seldom seen on low dose therapy, or patients rarely taking opioid medication.  Early Withdrawal Symptoms may include: Agitation Anxiety Muscle  aches Increased tearing Insomnia Runny nose Sweating Yawning  Late symptoms may include: Abdominal cramping Diarrhea Dilated pupils Goose bumps Nausea Vomiting  (Last update: 01/27/2022) ____________________________________________________________________________________________    ____________________________________________________________________________________________  WARNING: CBD (cannabidiol) & Delta (Delta-8 tetrahydrocannabinol) products.   Applicable to:  All individuals currently taking or considering taking CBD (cannabidiol) and, more important, all patients taking opioid analgesic controlled substances (pain medication). (Example: oxycodone; oxymorphone; hydrocodone; hydromorphone; morphine; methadone; tramadol; tapentadol; fentanyl; buprenorphine; butorphanol; dextromethorphan; meperidine; codeine; etc.)  Introduction:  Recently there has been a drive towards the use of "natural" products for the treatment of different conditions, including pain anxiety and sleep disorders. Marijuana and hemp are two varieties of the cannabis genus plants. Marijuana and its derivatives are illegal, while hemp and its derivatives are not. Cannabidiol (CBD) and tetrahydrocannabinol (THC), are two natural compounds found in plants of the Cannabis genus. They can both be extracted from hemp or marijuana. Both compounds interact with your body's endocannabinoid system in very different ways. CBD is associated with pain relief (analgesia) while THC is associated with the psychoactive effects ("the high") obtained from the use of marijuana products. There are two main types of THC: Delta-9, which comes from the marijuana plant and it is illegal, and Delta-8, which comes from the hemp plant, and it is legal. (Both, Delta-9-THC and Delta-8-THC are psychoactive and give you "the high".)   Legality:  Marijuana and its derivatives: illegal Hemp and its derivatives: Legal (State dependent) UPDATE:  (04/06/2021) The Drug Enforcement Agency ([place]) issued a letter stating that "delta" cannabinoids, including Delta-8-THCO and Delta-9-THCO, synthetically derived from hemp do not qualify as hemp and will be viewed as Schedule I drugs. (Schedule I drugs, substances, or chemicals are defined as drugs with no currently accepted medical use and a high potential for abuse. Some examples of Schedule I drugs are: heroin,  lysergic acid diethylamide (LSD), marijuana (cannabis), 3,4-methylenedioxymethamphetamine (ecstasy), methaqualone, and peyote.) (https://jennings.com/)  Legal status of CBD in [place]:  "Conditionally Legal"  Reference: "FDA Regulation of Cannabis and Cannabis-Derived Products, Including Cannabidiol (CBD)" - SeekArtists.com.pt  Warning:  CBD is not FDA approved and has not undergo the same manufacturing controls as prescription drugs.  This means that the purity and safety of available CBD may be questionable. Most of the time, despite manufacturer's claims, it is contaminated with THC (delta-9-tetrahydrocannabinol - the chemical in marijuana responsible for the "HIGH").  When this is the case, the [facility] contaminant will trigger a positive urine drug screen (UDS) test for Marijuana (carboxy-THC).   The FDA recently put out a warning about 5 things that everyone should be aware of regarding Delta-8 THC: Delta-8 THC products have not been evaluated or approved by the FDA for safe use and may be marketed in ways that put the public health at risk. The FDA has received adverse event reports involving delta-8 THC-containing products. Delta-8 THC has psychoactive and intoxicating effects. Delta-8 THC manufacturing often involve use of potentially harmful chemicals to create the concentrations of delta-8 THC claimed in the marketplace. The final delta-8 THC product may have potentially harmful  by-products (contaminants) due to the chemicals used in the process. Manufacturing of delta-8 THC products may occur in uncontrolled or unsanitary settings, which may lead to the presence of unsafe contaminants or other potentially harmful substances. Delta-8 THC products should be kept out of the reach of children and pets.  NOTE: Because a positive UDS for any illicit substance is a violation of our medication agreement, your opioid analgesics (pain medicine) may be permanently discontinued.  MORE ABOUT CBD  General Information: CBD was discovered in [birth year] and it is a derivative of the cannabis sativa genus plants (Marijuana and Hemp). It is one of the 113 identified substances found in Marijuana. It accounts for up to 40% of the plant's extract. As of 2018, preliminary clinical studies on CBD included research for the treatment of anxiety, movement disorders, and pain. CBD is available and consumed in multiple forms, including inhalation of smoke or vapor, as an aerosol spray, and by mouth. It may be supplied as an oil containing CBD, capsules, dried cannabis, or as a liquid solution. CBD is thought not to be as psychoactive as THC (delta-9-tetrahydrocannabinol - the chemical in marijuana responsible for the "HIGH"). Studies suggest that CBD may interact with different biological target receptors in the body, including cannabinoid and other neurotransmitter receptors. As of 2018 the mechanism of action for its biological effects has not been determined.  Side-effects  Adverse reactions: Dry mouth, diarrhea, decreased appetite, fatigue, drowsiness, malaise, weakness, sleep disturbances, and others.  Drug interactions:  CBD may interact with medications such as blood-thinners. CBD causes drowsiness on its own and it will increase drowsiness caused by other medications, including antihistamines (such as Benadryl), benzodiazepines (Xanax, Ativan, Valium), antipsychotics, antidepressants, opioids, alcohol  and supplements such as kava, melatonin and St. John's Wort.  Other drug interactions: Brivaracetam (Briviact); Caffeine; Carbamazepine (Tegretol); Citalopram (Celexa); Clobazam (Onfi); Eslicarbazepine (Aptiom); Everolimus (Zostress); Lithium; Methadone (Dolophine); Rufinamide (Banzel); Sedative medications (CNS depressants); Sirolimus (Rapamune); Stiripentol (Diacomit); Tacrolimus (Prograf); Tamoxifen ; Soltamox); Topiramate (Topamax); Valproate; Warfarin (Coumadin); Zonisamide. (Last update: 01/28/2022) ____________________________________________________________________________________________   ____________________________________________________________________________________________  Naloxone Nasal Spray  Why am I receiving this medication? [place] STOP ACT requires that all patients taking high dose opioids or at risk of opioids respiratory depression, be prescribed an opioid reversal agent, such as  Naloxone (AKA: Narcan).  What is this medication? NALOXONE (nal OX one) treats opioid overdose, which causes slow or shallow breathing, severe drowsiness, or trouble staying awake. Call emergency services after using this medication. You may need additional treatment. Naloxone works by reversing the effects of opioids. It belongs to a group of medications called opioid blockers.  COMMON BRAND NAME(S): Kloxxado, Narcan  What should I tell my care team before I take this medication? They need to know if you have any of these conditions: Heart disease Substance use disorder An unusual or allergic reaction to naloxone, other medications, foods, dyes, or preservatives Pregnant or trying to get pregnant Breast-feeding  When to use this medication? This medication is to be used for the treatment of respiratory depression (less than 8 breaths per minute) secondary to opioid overdose.   How to use this medication? This medication is for use in the nose. Lay the person on their back.  Support their neck with your hand and allow the head to tilt back before giving the medication. The nasal spray should be given into 1 nostril. After giving the medication, move the person onto their side. Do not remove or test the nasal spray until ready to use. Get emergency medical help right away after giving the first dose of this medication, even if the person wakes up. You should be familiar with how to recognize the signs and symptoms of a narcotic overdose. If more doses are needed, give the additional dose in the other nostril. Talk to your care team about the use of this medication in children. While this medication may be prescribed for children as young as newborns for selected conditions, precautions do apply.  Naloxone Overdosage: If you think you have taken too much of this medicine contact a poison control center or emergency room at once.  NOTE: This medicine is only for you. Do not share this medicine with others.  What if I miss a dose? This does not apply.  What may interact with this medication? This is only used during an emergency. No interactions are expected during emergency use. This list may not describe all possible interactions. Give your health care provider a list of all the medicines, herbs, non-prescription drugs, or dietary supplements you use. Also tell them if you smoke, drink alcohol, or use illegal drugs. Some items may interact with your medicine.  What should I watch for while using this medication? Keep this medication ready for use in the case of an opioid overdose. Make sure that you have the phone number of your care team and local hospital ready. You may need to have additional doses of this medication. Each nasal spray contains a single dose. Some emergencies may require additional doses. After use, bring the treated person to the nearest hospital or call 911. Make sure the treating care team knows that the person has received a dose of this medication.  You will receive additional instructions on what to do during and after use of this medication before an emergency occurs.  What side effects may I notice from receiving this medication? Side effects that you should report to your care team as soon as possible: Allergic reactions--skin rash, itching, hives, swelling of the face, lips, tongue, or throat Side effects that usually do not require medical attention (report these to your care team if they continue or are bothersome): Constipation Dryness or irritation inside the nose Headache Increase in blood pressure Muscle spasms Stuffy nose Toothache This list may not  describe all possible side effects. Call your doctor for medical advice about side effects. You may report side effects to FDA at 1-800-FDA-1088.  Where should I keep my medication? Because this is an emergency medication, you should keep it with you at all times.  Keep out of the reach of children and pets. Store between 20 and 25 degrees C (68 and 77 degrees F). Do not freeze. Throw away any unused medication after the expiration date. Keep in original box until ready to use.  NOTE: This sheet is a summary. It may not cover all possible information. If you have questions about this medicine, talk to your doctor, pharmacist, or health care provider.   2023 [employer] Standard (2020-10-13 00:00:00)  ____________________________________________________________________________________________

## 2022-05-05 NOTE — Progress Notes (Signed)
PROVIDER NOTE: Information contained herein reflects review and annotations entered in association with encounter. Interpretation of such information and data should be left to medically-trained personnel. Information provided to patient can be located elsewhere in the medical record under "Patient Instructions". Document created using STT-dictation technology, any transcriptional errors that may result from process are unintentional.    Patient: Kenneth Osborn  Service Category: E/M  Provider: Oswaldo Done, MD  DOB: 1955/05/26  DOS: 05/06/2022  Referring Provider: Jerl Mina, MD  MRN: [id number]  Specialty: Interventional Pain Management  PCP: Jerl Mina, MD  Type: Established Patient  Setting: Ambulatory outpatient    Location: Office  Delivery: Face-to-face     HPI  [name], a 67 y.o. year old male, is here today because of his Acute pain of left knee [M25.562]. 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 [facility] on 01/04/2013); Neurogenic pain; Thoracic facet syndrome (T8-10); Lumbar facet syndrome (Bilateral) (R>L); Cervical facet syndrome (Right); Cervical spondylosis; Lumbar spondylosis; Chronic upper extremity pain (Left); Chronic cervical radicular pain (Left); Chronic upper back pain; History of thoracic spine surgery (S/P T9-10 IVD spacer); Failed back surgical syndrome; Chronic musculoskeletal pain; Chronic lower extremity pain (Left); Chronic Lumbar radicular pain (Left: L4); Arthralgia of shoulder; Chronic tension-type headache, not intractable; 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); Chronic pain syndrome; Headache  disorder; Trigger point posterior superior iliac spine (PSIS) (Right); 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); Polyneuropathy; Type 2 diabetes mellitus with diabetic polyneuropathy, with long-term current use of insulin (HCC); Spondylosis without myelopathy or radiculopathy, lumbosacral region; Frequent falls; Diabetic polyneuropathy associated with diabetes mellitus due to underlying condition (HCC); Neuropathic pain; Musculoskeletal pain; DDD (degenerative disc disease), lumbosacral; Chronic low back pain (Bilateral) w/o sciatica; Renal mass; 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; Malignant neoplasm of left kidney, except renal pelvis (HCC); Chronic low back pain (Right) w/ sciatica (Right); Chronic lower extremity pain (Right); Chronic Lumbar radicular pain (Right: S1); Epidural fibrosis; Acute pain of left knee; and Chronic knee pain (Left) on their pertinent problem list. Pain Assessment: Severity of Chronic pain is reported as a 5 /10. Location: Knee (lumbar bilateral) Left/knee pain radiating up into left hip. Onset: More than a month ago. Quality: Discomfort, Constant, Pounding, Pressure. Timing: Constant. Modifying factor(s): brace for the knee. Vitals:  height is 5\' 9"  (1.753 m) and weight is 220 lb (99.8 kg). His temporal temperature is 97.7 F (36.5 C). His blood pressure is 160/77 (abnormal) and his pulse is 112 (abnormal). His respiration is 16 and oxygen saturation is 98%.  BMI: Estimated body mass index is 32.49 kg/m as calculated from the following:   Height as of this encounter: 5\' 9"  (1.753 m).   Weight as of this encounter: 220 lb (99.8 kg). Last encounter: 04/29/2022. Last procedure: 12/18/2021.  Reason for encounter: medication management.  The patient indicates doing well with the current medication regimen. No adverse reactions or side effects reported to the medications.  The patient took it upon himself to start the process of doing  a drug holiday.  He refers that he slowly went down on the amount of pills that he was taking per day  and for the past 2 weeks he has been taking 2 tablets/day.  He refers that started taking 1/day yesterday.  He was instructed to do that for 1 week and then to start his 14-day "Drug Holiday".  If he starts the drug holiday on 05/13/2022 he should end the drug holiday on 05/27/2022.  Today he had a pill count of 186 pills.  If he takes 1 pill per they for the next 7 days he should end up with 179 pills that he should have available once he completes his drug holiday.  If he was to begin taking 3 tablets/day on 05/27/2022, day 179 pills should last him for 59 days or until 07/25/2022.  From that point on, he should continue with 3/day instead of the 5/day that he has been taking.  Today I will write his next prescription for oxycodone IR 5 mg 1 tab p.o. every 8 hours to be filled on 07/25/2022.  In addition to the above, the patient indicates having had a fall approximately 1 week ago where he went to the [facility] due to increased pain in the left knee where he apparently was hit.  He refers that he has been having multiple falls.  The main concern that I have is with the fact that the patient is on the anticoagulants.  Because the x-rays were done at the [facility], I do not have the images or the official report.  According to the patient he was told that the x-rays were negative.  However he has continued to complain of pain in the lateral aspect of the knee.  Today's physical exam did not show any redness, bruising, or swelling.  He does have exquisite tenderness to palpation over the lateral cruciate ligament.  He also indicates that he feels very unstable on that knee.  Because of the possibility of hemarthrosis and his complaints of instability on the left knee, today I will be ordering an MRI of that left knee.  I have instructed the patient to give [name] a call after he has had the MRI done so that we can  go over that and determine if there is anything else that we will need to do.  He refers that due to this acute left knee trauma, he refers that he feels he could use his full dose of oxycodone, but he understands that he has already started this "Drug Holiday" process and he needs to see it through.  He does not complain of any type of withdrawal symptoms or any major problems associated with his tapering down of the opioids.  He refers that he has done this in the past without any problems.  I did remind him that anytime that he is planning on doing a drug holiday I would prefer if he does it under my supervision so that I can keep track of the pill counts so as to be able to truly document that he has completed the process.  Today we will simply use the pill counts and calculations to determine when he will be needing his new prescription assuming that he truly completes the "Drug Holiday" as instructed.  He understands and agrees.  Today I had initially sent his regular prescriptions to the pharmacy but seen that we have already started the drug holiday process, I have called and cancel those prescriptions and I have sent a new one taking into account the changes that we have already made.  RTCB: 08/24/2022    Pharmacotherapy Assessment  Analgesic: Oxycodone IR 5 mg, 1 tab PO 5x/day (25 mg/day of oxycodone).  (05/06/2022) "Drug Holiday" process with subsequent change in dosing to 3 times daily. MME/day: 37.5 mg/day.   Monitoring: [place] PMP: PDMP reviewed during this encounter.       Pharmacotherapy: No side-effects or adverse reactions reported. Compliance: No problems identified. Effectiveness: Clinically acceptable.  Vernie Ammons, RN  05/06/2022  3:09 PM  Sign when Signing Visit Nursing Pain Medication Assessment:  Safety precautions to be maintained throughout the outpatient stay will include: orient to surroundings, keep bed in low position, maintain call bell within reach at all times,  provide assistance with transfer out of bed and ambulation.  Medication Inspection Compliance: Pill count conducted under aseptic conditions, in front of the patient. Neither the pills nor the bottle was removed from the patient's sight at any time. Once count was completed pills were immediately returned to the patient in their original bottle.  Medication #1: Oxycodone IR Pill/Patch Count:  36 of 150 pills remain Pill/Patch Appearance: Markings consistent with prescribed medication Bottle Appearance: Standard pharmacy container. Clearly labeled. Filled Date: 01 / 29 / 2024 Last Medication intake:  Today  Medication #2: Oxycodone IR Pill/Patch Count:  150 of 150 pills remain Pill/Patch Appearance: Markings consistent with prescribed medication Bottle Appearance: Standard pharmacy container. Clearly labeled. Filled Date: 03 / 04 / 2024 Last Medication intake:   has not started on this Rx,    No results found for: "CBDTHCR" No results found for: "D8THCCBX" No results found for: "D9THCCBX"  UDS:  Summary  Date Value Ref Range Status  06/27/2021 Note  Final    Comment:    ==================================================================== [employer] Select 13 (MW) ==================================================================== Test                             Result       Flag       Units  Drug Present and Declared for Prescription Verification   Desmethyldiazepam              222          EXPECTED   ng/mg creat   Oxazepam                       665          EXPECTED   ng/mg creat   Temazepam                      411          EXPECTED   ng/mg creat    Desmethyldiazepam, oxazepam, and temazepam are expected metabolites    of diazepam. Desmethyldiazepam and oxazepam are also expected    metabolites of other drugs, including chlordiazepoxide, prazepam,    clorazepate, and halazepam. Oxazepam is an expected metabolite of    temazepam. Oxazepam and temazepam are also available as  scheduled    prescription medications.    Oxycodone                      371          EXPECTED   ng/mg creat   Oxymorphone                    1345         EXPECTED   ng/mg creat   Noroxycodone  511          EXPECTED   ng/mg creat   Noroxymorphone                 320          EXPECTED   ng/mg creat    Sources of oxycodone are scheduled prescription medications.    Oxymorphone, noroxycodone, and noroxymorphone are expected    metabolites of oxycodone. Oxymorphone is also available as a    scheduled prescription medication.  Drug Absent but Declared for Prescription Verification   Alprazolam                     Not Detected UNEXPECTED ng/mg creat ==================================================================== Test                      Result    Flag   Units      Ref Range   Creatinine              65               mg/dL      >=16 ==================================================================== Declared Medications:  The flagging and interpretation on this report are based on the  following declared medications.  Unexpected results may arise from  inaccuracies in the declared medications.   **Note: The testing scope of this panel includes these medications:   Alprazolam (Xanax)  Diazepam (Valium)  Oxycodone (Roxicodone)   **Note: The testing scope of this panel does not include the  following reported medications:   Aspirin  Cetirizine (Zyrtec)  Cyclobenzaprine (Flexeril)  Diltiazem (Cardizem)  Docusate (Senokot-S)  Enalapril (Vasotec)  Esomeprazole (Nexium)  Ezetimibe (Zetia)  Gabapentin (Neurontin)  Glucagon  Insulin (Humulin)  Isosorbide (Imdur)  Magnesium (Mag-Ox)  Meclizine (Antivert)  Metformin (Glucophage)  Metoprolol (Toprol)  Montelukast (Singulair)  Naloxegol (Movantik)  Niacin  Nitroglycerin (Nitrostat)  Polyethylene Glycol (MiraLAX)  Prasugrel (Effient)  Rosuvastatin (Crestor)  Sennosides  (Senokot-S) ==================================================================== For clinical consultation, please call [phone]. ====================================================================       ROS  Constitutional: Denies any fever or chills Gastrointestinal: No reported hemesis, hematochezia, vomiting, or acute GI distress Musculoskeletal: Denies any acute onset joint swelling, redness, loss of ROM, or weakness Neurological: No reported episodes of acute onset apraxia, aphasia, dysarthria, agnosia, amnesia, paralysis, loss of coordination, or loss of consciousness  Medication Review  ALPRAZolam, Glucagon, Magnesium Oxide, aspirin EC, cetirizine, cyclobenzaprine, diltiazem, esomeprazole, ezetimibe, gabapentin, insulin regular human CONCENTRATED, isosorbide mononitrate, meclizine, metFORMIN, metoprolol succinate, montelukast, naloxegol oxalate, naloxone, niacin, nitroGLYCERIN, oxyCODONE, prasugrel, rosuvastatin, and senna-docusate  History Review  Allergy: Kenneth Osborn has No Known Allergies. Drug: Kenneth Osborn  reports no history of drug use. Alcohol:  reports no history of alcohol use. Tobacco:  reports that he has never smoked. He has never used smokeless tobacco. Social: Kenneth Osborn  reports that he has never smoked. He has never used smokeless tobacco. He reports that he does not drink alcohol and does not use drugs. Medical:  has a past medical history of Acute postoperative pain (12/03/2018), Allergic rhinitis (12/30/2012), Anginal pain (HCC), Anxiety, Bronchitis, Can't get food down (08/12/2014), Cancer (HCC) (01/2020), Chronic back pain, Concussion (09/2015), Coronary artery disease, DDD (degenerative disc disease), cervical, Dehydration symptoms, Diabetes mellitus without complication (HCC), Dysphagia, GERD (gastroesophageal reflux disease), History of Meniere's disease (12/21/2014), History of thoracic spine surgery (S/P T9-10 IVD spacer) (12/21/2014), Hypercholesteremia,  Hyperlipidemia, Hypertension, Meniere's disease, Myocardial infarction (HCC) (2012), Neuromuscular disorder (HCC), Renal disorder, Severe sepsis (  HCC) (02/07/2020), Short-segment Barrett's esophagus, and Sleep apnea. Surgical: Kenneth Osborn  has a past surgical history that includes Cardiovascular stress test; Cardiac catheterization; Labrinthectomy; mastoid shunt (Bilateral); Back surgery; Shoulder arthroscopy with subacromial decompression (Left, 04/06/2012); ARTHRODESIS ANTERIOR ANTERIOR CERVICLE SPINE (01/04/2013); Colonoscopy with propofol (N/A, 09/19/2014); Esophagogastroduodenoscopy (N/A, 09/19/2014); Savory dilation (N/A, 09/19/2014); Spinal cord stimulator implant (Right); Cardiac catheterization (N/A, 06/15/2015); Cardiac catheterization (N/A, 06/15/2015); Pulse generator implant (N/A, 01/18/2019); Lumbar spinal cord simulator lead removal (Right, 08/09/2019); IR Perc Cholecystostomy (02/09/2020); IR Radiologist Eval & Mgmt (03/14/2020); Coronary angioplasty; Appendectomy; Robot assisted laparoscopic nephrectomy (Left, 04/20/2020); and Colonoscopy with propofol (N/A, 04/03/2021). Family: family history includes Cancer in his sister; Diabetes in his maternal grandmother, mother, and paternal grandmother; Heart disease in his father, maternal aunt, maternal uncle, and mother.  Laboratory Chemistry Profile   Renal Lab Results  Component Value Date   BUN 37 (H) 11/02/2021   CREATININE 2.18 (H) 11/02/2021   GFRAA 58 (L) 01/08/2019   GFRNONAA 32 (L) 11/02/2021    Hepatic Lab Results  Component Value Date   AST 33 11/01/2021   ALT 31 11/01/2021   ALBUMIN 4.0 11/01/2021   ALKPHOS 79 11/01/2021   HCVAB NON REACTIVE 02/08/2020   LIPASE 28 12/11/2020   AMMONIA 24 02/07/2020    Electrolytes Lab Results  Component Value Date   NA 138 11/02/2021   K 5.9 (H) 11/02/2021   CL 107 11/02/2021   CALCIUM 9.8 11/02/2021   MG 1.6 (L) 11/02/2021   PHOS 3.1 11/09/2020    Bone No results found for: "VD25OH",  "VD125OH2TOT", "ZO1096EA5", "WU9811BJ4", "25OHVITD1", "25OHVITD2", "25OHVITD3", "TESTOFREE", "TESTOSTERONE"  Inflammation (CRP: Acute Phase) (ESR: Chronic Phase) Lab Results  Component Value Date   CRP 1.4 (H) 03/22/2015   ESRSEDRATE 9 03/22/2015   LATICACIDVEN 1.5 12/11/2020         Note: Above Lab results reviewed.  Recent Imaging Review  DG PAIN CLINIC C-ARM 1-60 MIN NO REPORT Fluoro was used, but no Radiologist interpretation will be provided.  Please refer to "NOTES" tab for provider progress note. Note: Reviewed        Physical Exam  General appearance: Well nourished, well developed, and well hydrated. In no apparent acute distress Mental status: Alert, oriented x 3 (person, place, & time)       Respiratory: No evidence of acute respiratory distress Eyes: PERLA Vitals: BP (!) 160/77 (BP Location: Left Arm, Patient Position: Sitting, Cuff Size: Normal)   Pulse (!) 112   Temp 97.7 F (36.5 C) (Temporal)   Resp 16   Ht 5\' 9"  (1.753 m)   Wt 220 lb (99.8 kg)   SpO2 98%   BMI 32.49 kg/m  BMI: Estimated body mass index is 32.49 kg/m as calculated from the following:   Height as of this encounter: 5\' 9"  (1.753 m).   Weight as of this encounter: 220 lb (99.8 kg). Ideal: Ideal body weight: 70.7 kg (155 lb 13.8 oz) Adjusted ideal body weight: 82.3 kg (181 lb 8.3 oz)  Assessment   Diagnosis Status  1. Acute pain of left knee   2. Chronic knee pain (Left)   3. Chronic pain syndrome   4. Cervical facet syndrome (Right)   5. 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 Carolyn Stare at [facility] on 01/04/2013)   6. Thoracic facet syndrome (T8-10)   7. Chronic upper back pain   8. Failed back surgical syndrome   9. Chronic lower extremity pain (Left)   10. Chronic upper extremity pain (  Left)   11. Chronic low back pain (Bilateral) w/o sciatica   12. Pharmacologic therapy   13. Chronic musculoskeletal pain   14. Chronic use of opiate for therapeutic purpose   15.  Encounter for medication management   16. Encounter for chronic pain management   17. Chronic anticoagulation (Effient)   18. Frequent falls    Controlled Controlled Controlled   Updated Problems: Problem  Acute Pain of Left Knee   Secondary to a recent fall.   Chronic knee pain (Left)    Plan of Care  Problem-specific:  No problem-specific Assessment & Plan notes found for this encounter.  Kenneth Osborn has a current medication list which includes the following long-term medication(s): cetirizine, cyclobenzaprine, esomeprazole, ezetimibe, gabapentin, humulin r u-500 kwikpen, metoprolol succinate, montelukast, niacin, and [START ON 07/25/2022] oxycodone.  Pharmacotherapy (Medications Ordered): Meds ordered this encounter  Medications   DISCONTD: oxyCODONE (OXY IR/ROXICODONE) 5 MG immediate release tablet    Sig: Take 1 tablet (5 mg total) by mouth 5 (five) times daily. Must last 30 days.    Dispense:  150 tablet    Refill:  0    DO NOT: delete (not duplicate); no partial-fill (will deny script to complete), no refill request (F/U required). DISPENSE: 1 day early if closed on fill date. WARN: No CNS-depressants within 8 hrs of med.   DISCONTD: oxyCODONE (OXY IR/ROXICODONE) 5 MG immediate release tablet    Sig: Take 1 tablet (5 mg total) by mouth 5 (five) times daily. Must last 30 days.    Dispense:  150 tablet    Refill:  0    DO NOT: delete (not duplicate); no partial-fill (will deny script to complete), no refill request (F/U required). DISPENSE: 1 day early if closed on fill date. WARN: No CNS-depressants within 8 hrs of med.   DISCONTD: oxyCODONE (OXY IR/ROXICODONE) 5 MG immediate release tablet    Sig: Take 1 tablet (5 mg total) by mouth 5 (five) times daily. Must last 30 days.    Dispense:  150 tablet    Refill:  0    DO NOT: delete (not duplicate); no partial-fill (will deny script to complete), no refill request (F/U required). DISPENSE: 1 day early if closed on fill  date. WARN: No CNS-depressants within 8 hrs of med.   oxyCODONE (OXY IR/ROXICODONE) 5 MG immediate release tablet    Sig: Take 1 tablet (5 mg total) by mouth every 8 (eight) hours. Must last 30 days.    Dispense:  90 tablet    Refill:  0    DO NOT: delete (not duplicate); no partial-fill (will deny script to complete), no refill request (F/U required). DISPENSE: 1 day early if closed on fill date. WARN: No CNS-depressants within 8 hrs of med.   Orders:  Orders Placed This Encounter  Procedures   MR KNEE LEFT WO CONTRAST    Standing Status:   Future    Standing Expiration Date:   06/06/2022    Scheduling Instructions:     Please make sure that the patient understands that this needs to be done as soon as possible. Never have the patient do the imaging "just before the next appointment". Inform patient that having the imaging done within the [facility] will expedite the availability of the results and will provide      imaging availability to the requesting physician. In addition inform the patient that the imaging order has an expiration date and will not be renewed if not  done within the active period.    Order Specific Question:   What is the patient's sedation requirement?    Answer:   No Sedation    Order Specific Question:   Does the patient have a pacemaker or implanted devices?    Answer:   No    Order Specific Question:   Preferred imaging location?    Answer:   [facility] (table limit-350lbs)    Order Specific Question:   Call Results- Best Contact Number?    Answer:   [phone] [facility])    Order Specific Question:   Radiology Contrast Protocol - do NOT remove file path    Answer:   [email or web address]   Follow-up plan:   No follow-ups on file.      Interventional Therapies  Risk  Complexity Considerations:   Estimated body mass index is 28.94 kg/m as calculated from the following:   Height as of this encounter: 5\' 9"   (1.753 m).   Weight as of this encounter: 196 lb (88.9 kg). NOTE: EFFIENT Anticoagulation (Stop: 7-10 days  Re-start: 6 hrs)   Planned  Pending:   Downward taper of opioids started on 01/02/2022 for the purpose of completing a "Drug Holiday".   Under consideration:   CYP2D6 / CYP3A4 genetic testing.   Completed:   Therapeutic right Racz procedure x1 (12/18/2021) (100/100/70/70-100)  Diagnostic/therapeutic right caudal ESI x1 (11/13/2021)  Palliative right PSIS MNB/TPI x3 (11/13/2017) (100/100/100)  Diagnostic/palliative right lumbar facet MBB x2 (09/22/2018) (100/100/75/75)  Diagnostic/palliative left lumbar facet MBB x2 (09/22/2018) (100/100/75/75)  Palliative left lumbar facet RFA x3 (02/20/2021) (85/85/55/60-70)  Palliative right lumbar facet RFA x3 (01/09/2021) (100/95/50/60-70)    Therapeutic  Palliative (PRN) options:   Palliative right PSIS MNB/TPI  Diagnostic/palliative lumbar facet MBB  Palliative lumbar facet RFA    Pharmacotherapy  Nonopioids transferred 11/22/2019: Magnesium, Flexeril, and Neurontin.       Recent Visits No visits were found meeting these conditions. Showing recent visits within past 90 days and meeting all other requirements Today's Visits Date Type Provider Dept  05/06/22 Office Visit Delano Metz, MD [facility]  Showing today's visits and meeting all other requirements Future Appointments No visits were found meeting these conditions. Showing future appointments within next 90 days and meeting all other requirements  I discussed the assessment and treatment plan with the patient. The patient was provided an opportunity to ask questions and all were answered. The patient agreed with the plan and demonstrated an understanding of the instructions.  Patient advised to call back or seek an in-person evaluation if the symptoms or condition worsens.  Duration of encounter: 52 minutes.  Total time on encounter, as per AMA guidelines  included both the face-to-face and non-face-to-face time personally spent by the physician and/or other qualified health care professional(s) on the day of the encounter (includes time in activities that require the physician or other qualified health care professional and does not include time in activities normally performed by clinical staff). Physician's time may include the following activities when performed: Preparing to see the patient (e.g., pre-charting review of records, searching for previously ordered imaging, lab work, and nerve conduction tests) Review of prior analgesic pharmacotherapies. Reviewing PMP Interpreting ordered tests (e.g., lab work, imaging, nerve conduction tests) Performing post-procedure evaluations, including interpretation of diagnostic procedures Obtaining and/or reviewing separately obtained history Performing a medically appropriate examination and/or evaluation Counseling and educating the patient/family/caregiver Ordering medications, tests, or procedures Referring and communicating with other health care professionals (when  not separately reported) Documenting clinical information in the electronic or other health record Independently interpreting results (not separately reported) and communicating results to the patient/ family/caregiver Care coordination (not separately reported)  Note by: Oswaldo Done, MD Date: 05/06/2022; Time: 4:14 PM

## 2022-05-05 NOTE — Patient Instructions (Signed)
____________________________________________________________________________________________  Opioid Pain Medication Update  To: All patients taking opioid pain medications. (I.e.: hydrocodone, hydromorphone, oxycodone, oxymorphone, morphine, codeine, methadone, tapentadol, tramadol, buprenorphine, fentanyl, etc.)  Re: Updated review of side effects and adverse reactions of opioid analgesics, as well as new information about long term effects of this class of medications.  Direct risks of long-term opioid therapy are not limited to opioid addiction and overdose. Potential medical risks include serious fractures, breathing problems during sleep, hyperalgesia, immunosuppression, chronic constipation, bowel obstruction, myocardial infarction, and tooth decay secondary to xerostomia.  Unpredictable adverse effects that can occur even if you take your medication correctly: Cognitive impairment, respiratory depression, and death. Most people think that if they take their medication "correctly", and "as instructed", that they will be safe. Nothing could be farther from the truth. In reality, a significant amount of recorded deaths associated with the use of opioids has occurred in individuals that had taken the medication for a long time, and were taking their medication correctly. The following are examples of how this can happen: Patient taking his/her medication for a long time, as instructed, without any side effects, is given a certain antibiotic or another unrelated medication, which in turn triggers a "Drug-to-drug interaction" leading to disorientation, cognitive impairment, impaired reflexes, respiratory depression or an untoward event leading to serious bodily harm or injury, including death.  Patient taking his/her medication for a long time, as instructed, without any side effects, develops an acute impairment of liver and/or kidney function. This will lead to a rapid inability of the body to  breakdown and eliminate their pain medication, which will result in effects similar to an "overdose", but with the same medicine and dose that they had always taken. This again may lead to disorientation, cognitive impairment, impaired reflexes, respiratory depression or an untoward event leading to serious bodily harm or injury, including death.  A similar problem will occur with patients as they grow older and their liver and kidney function begins to decrease as part of the aging process.  Background information: Historically, the original case for using long-term opioid therapy to treat chronic noncancer pain was based on safety assumptions that subsequent experience has called into question. In 1996, the American Pain Society and the American Academy of Pain Medicine issued a consensus statement supporting long-term opioid therapy. This statement acknowledged the dangers of opioid prescribing but concluded that the risk for addiction was low; respiratory depression induced by opioids was short-lived, occurred mainly in opioid-naive patients, and was antagonized by pain; tolerance was not a common problem; and efforts to control diversion should not constrain opioid prescribing. This has now proven to be wrong. Experience regarding the risks for opioid addiction, misuse, and overdose in community practice has failed to support these assumptions.  According to the Centers for Disease Control and Prevention, fatal overdoses involving opioid analgesics have increased sharply over the past decade. Currently, more than 96,700 people die from drug overdoses every year. Opioids are a factor in 7 out of every 10 overdose deaths. Deaths from drug overdose have surpassed motor vehicle accidents as the leading cause of death for individuals between the ages of 35 and 54.  Clinical data suggest that neuroendocrine dysfunction may be very common in both men and women, potentially causing hypogonadism, erectile  dysfunction, infertility, decreased libido, osteoporosis, and depression. Recent studies linked higher opioid dose to increased opioid-related mortality. Controlled observational studies reported that long-term opioid therapy may be associated with increased risk for cardiovascular events. Subsequent meta-analysis concluded   that the safety of long-term opioid therapy in elderly patients has not been proven.   Side Effects and adverse reactions: Common side effects: Drowsiness (sedation). Dizziness. Nausea and vomiting. Constipation. Physical dependence -- Dependence often manifests with withdrawal symptoms when opioids are discontinued or decreased. Tolerance -- As you take repeated doses of opioids, you require increased medication to experience the same effect of pain relief. Respiratory depression -- This can occur in healthy people, especially with higher doses. However, people with COPD, asthma or other lung conditions may be even more susceptible to fatal respiratory impairment.  Uncommon side effects: An increased sensitivity to feeling pain and extreme response to pain (hyperalgesia). Chronic use of opioids can lead to this. Delayed gastric emptying (the process by which the contents of your stomach are moved into your small intestine). Muscle rigidity. Immune system and hormonal dysfunction. Quick, involuntary muscle jerks (myoclonus). Arrhythmia. Itchy skin (pruritus). Dry mouth (xerostomia).  Long-term side effects: Chronic constipation. Sleep-disordered breathing (SDB). Increased risk of bone fractures. Hypothalamic-pituitary-adrenal dysregulation. Increased risk of overdose.  RISKS: Fractures and Falls:  Opioids increase the risk and incidence of falls. This is of particular importance in elderly patients.  Endocrine System:  Long-term administration is associated with endocrine abnormalities (endocrinopathies). (Also known as Opioid-induced Endocrinopathy) Influences  on both the hypothalamic-pituitary-adrenal axis?and the hypothalamic-pituitary-gonadal axis have been demonstrated with consequent hypogonadism and adrenal insufficiency in both sexes. Hypogonadism and decreased levels of dehydroepiandrosterone sulfate have been reported in men and women. Endocrine effects include: Amenorrhoea in women (abnormal absence of menstruation) Reduced libido in both sexes Decreased sexual function Erectile dysfunction in men Hypogonadisms (decreased testicular function with shrinkage of testicles) Infertility Depression and fatigue Loss of muscle mass Anxiety Depression Immune suppression Hyperalgesia Weight gain Anemia Osteoporosis Patients (particularly women of childbearing age) should avoid opioids. There is insufficient evidence to recommend routine monitoring of asymptomatic patients taking opioids in the long-term for hormonal deficiencies.  Immune System: Human studies have demonstrated that opioids have an immunomodulating effect. These effects are mediated via opioid receptors both on immune effector cells and in the central nervous system. Opioids have been demonstrated to have adverse effects on antimicrobial response and anti-tumour surveillance. Buprenorphine has been demonstrated to have no impact on immune function.  Opioid Induced Hyperalgesia: Human studies have demonstrated that prolonged use of opioids can lead to a state of abnormal pain sensitivity, sometimes called opioid induced hyperalgesia (OIH). Opioid induced hyperalgesia is not usually seen in the absence of tolerance to opioid analgesia. Clinically, hyperalgesia may be diagnosed if the patient on long-term opioid therapy presents with increased pain. This might be qualitatively and anatomically distinct from pain related to disease progression or to breakthrough pain resulting from development of opioid tolerance. Pain associated with hyperalgesia tends to be more diffuse than the  pre-existing pain and less defined in quality. Management of opioid induced hyperalgesia requires opioid dose reduction.  Cancer: Chronic opioid therapy has been associated with an increased risk of cancer among noncancer patients with chronic pain. This association was more evident in chronic strong opioid users. Chronic opioid consumption causes significant pathological changes in the small intestine and colon. Epidemiological studies have found that there is a link between opium dependence and initiation of gastrointestinal cancers. Cancer is the second leading cause of death after cardiovascular disease. Chronic use of opioids can cause multiple conditions such as GERD, immunosuppression and renal damage as well as carcinogenic effects, which are associated with the incidence of cancers.   Mortality: Long-term opioid use   has been associated with increased mortality among patients with chronic non-cancer pain (CNCP).  Prescription of long-acting opioids for chronic noncancer pain was associated with a significantly increased risk of all-cause mortality, including deaths from causes other than overdose.  Reference: Von Korff M, Kolodny A, Deyo RA, Chou R. Long-term opioid therapy reconsidered. Ann Intern Med. 2011 Sep 6;155(5):325-8. doi: 10.7326/[id number]. PMID: 21893626; PMCID: PMC3280085. Bedson J, Chen Y, Ashworth J, Hayward RA, Dunn KM, Jordan KP. Risk of adverse events in patients prescribed long-term opioids: A cohort study in the [place] Clinical Practice Research Datalink. Eur J Pain. 2019 May;23(5):908-922. doi: 10.1002/ejp.1357. Epub 2019 Jan 31. PMID: 30620116. Colameco S, Coren JS, Ciervo CA. Continuous opioid treatment for chronic noncancer pain: a time for moderation in prescribing. Postgrad Med. 2009 Jul;121(4):61-6. doi: 10.3810/pgm.2009.07.2032. PMID: 19641271. Chou R, Turner JA, Devine EB, Hansen RN, Sullivan SD, Blazina I, Dana T, Bougatsos C, Deyo RA. The  effectiveness and risks of long-term opioid therapy for chronic pain: a systematic review for a National Institutes of Health Pathways to Prevention Workshop. Ann Intern Med. 2015 Feb 17;162(4):276-86. doi: 10.7326/M14-2559. PMID: 25581257. Warner M, Chen LH, Makuc DM. NCHS Data Brief No. 22. [place]: Centers for Disease Control and Prevention; 2009. Sep, Increase in Fatal Poisonings Involving Opioid Analgesics in the [place], 1999-2006. Song IA, Choi HR, Oh TK. Long-term opioid use and mortality in patients with chronic non-cancer pain: Ten-year follow-up study in [name] from 2010 through 2019. EClinicalMedicine. 2022 Jul 18;51:101558. doi: 10.1016/j.eclinm.2022.101558. PMID: 35875817; PMCID: PMC9304910. Huser, W., Schubert, T., Vogelmann, T. et al. All-cause mortality in patients with long-term opioid therapy compared with non-opioid analgesics for chronic non-cancer pain: a database study. BMC Med 18, 162 (2020). https://doi.org/10.1186/[email or web address] Rashidian H, Zendehdel K, Kamangar F, Malekzadeh R, Haghdoost AA. An Ecological Study of the Association between Opiate Use and Incidence of Cancers. Addict Health. 2016 Fall;8(4):252-260. PMID: 28819556; PMCID: PMC5554805.  Our Goal: Our goal is to control your pain with means other than the use of opioid pain medications.  Our Recommendation: Talk to your physician about coming off of these medications. We can assist you with the tapering down and stopping these medicines. Based on the new information, even if you cannot completely stop the medication, a decrease in the dose may be associated with a lesser risk. Ask for other means of controlling the pain. Decrease or eliminate those factors that significantly contribute to your pain such as smoking, obesity, and a diet heavily tilted towards "inflammatory" nutrients.  Last Updated: 04/17/2022    ____________________________________________________________________________________________     ____________________________________________________________________________________________  Patient Information update  To: All of our patients.  Re: Name change.  It has been made official that our current name, "[facility]"   will soon be changed to "[facility]".   The purpose of this change is to eliminate any confusion created by the concept of our practice being a "Medication Management Pain Clinic". In the past this has led to the misconception that we treat pain primarily by the use of prescription medications.  Nothing can be farther from the truth.   Understanding PAIN MANAGEMENT: To further understand what our practice does, you first have to understand that "Pain Management" is a subspecialty that requires additional training once a physician has completed their specialty training, which can be in either Anesthesia, Neurology, Psychiatry, or Physical Medicine and Rehabilitation (PMR). Each one of these contributes to the final approach taken by each physician to   the management of their patient's pain. To be a "Pain Management Specialist" you must have first completed one of the specialty trainings below.  Anesthesiologists - trained in clinical pharmacology and interventional techniques such as nerve blockade and regional as well as central neuroanatomy. They are trained to block pain before, during, and after surgical interventions.  Neurologists - trained in the diagnosis and pharmacological treatment of complex neurological conditions, such as Multiple Sclerosis, Parkinson's, spinal cord injuries, and other systemic conditions that may be associated with symptoms that may include but are not limited to pain. They tend to rely primarily on the treatment of chronic pain  using prescription medications.  Psychiatrist - trained in conditions affecting the psychosocial wellbeing of patients including but not limited to depression, anxiety, schizophrenia, personality disorders, addiction, and other substance use disorders that may be associated with chronic pain. They tend to rely primarily on the treatment of chronic pain using prescription medications.   Physical Medicine and Rehabilitation (PMR) physicians, also known as physiatrists - trained to treat a wide variety of medical conditions affecting the brain, spinal cord, nerves, bones, joints, ligaments, muscles, and tendons. Their training is primarily aimed at treating patients that have suffered injuries that have caused severe physical impairment. Their training is primarily aimed at the physical therapy and rehabilitation of those patients. They may also work alongside orthopedic surgeons or neurosurgeons using their expertise in assisting surgical patients to recover after their surgeries.  INTERVENTIONAL PAIN MANAGEMENT is sub-subspecialty of Pain Management.  Our physicians are Board-certified in Anesthesia, Pain Management, and Interventional Pain Management.  This meaning that not only have they been trained and Board-certified in their specialty of Anesthesia, and subspecialty of Pain Management, but they have also received further training in the sub-subspecialty of Interventional Pain Management, in order to become Board-certified as INTERVENTIONAL PAIN MANAGEMENT SPECIALIST.    Mission: Our goal is to use our skills in  INTERVENTIONAL PAIN MANAGEMENT as alternatives to the chronic use of prescription opioid medications for the treatment of pain. To make this more clear, we have changed our name to reflect what we do and offer. We will continue to offer medication management assessment and recommendations, but we will not be taking over any patient's medication  management.  ____________________________________________________________________________________________     ____________________________________________________________________________________________  National Pain Medication Shortage  The [place] is experiencing worsening drug shortages. These have had a negative widespread effect on patient care and treatment. Not expected to improve any time soon. Predicted to last past 2029.   Drug shortage list (generic names) Oxycodone IR Oxycodone/APAP Oxymorphone IR Hydromorphone Hydrocodone/APAP Morphine  Where is the problem?  Manufacturing and supply level.  Will this shortage affect you?  Only if you take any of the above pain medications.  How? You may be unable to fill your prescription.  Your pharmacist may offer a "partial fill" of your prescription. (Warning: Do not accept partial fills.) Prescriptions partially filled cannot be transferred to another pharmacy. Read our Medication Rules and Regulation. Depending on how much medicine you are dependent on, you may experience withdrawals when unable to get the medication.  Recommendations: Consider ending your dependence on opioid pain medications. Ask your pain specialist to assist you with the process. Consider switching to a medication currently not in shortage, such as Buprenorphine. Talk to your pain specialist about this option. Consider decreasing your pain medication requirements by managing tolerance thru "Drug Holidays". This may help minimize withdrawals, should you run out of medicine. Control your pain thru   the use of non-pharmacological interventional therapies.   Your prescriber: Prescribers cannot be blamed for shortages. Medication manufacturing and supply issues cannot be fixed by the prescriber.   NOTE: The prescriber is not responsible for supplying the medication, or solving supply issues. Work with your pharmacist to solve it. The patient is responsible for  the decision to take or continue taking the medication and for identifying and securing a legal supply source. By law, supplying the medication is the job and responsibility of the pharmacy. The prescriber is responsible for the evaluation, monitoring, and prescribing of these medications.   Prescribers will NOT: Re-issue prescriptions that have been partially filled. Re-issue prescriptions already sent to a pharmacy.  Re-send prescriptions to a different pharmacy because yours did not have your medication. Ask pharmacist to order more medicine or transfer the prescription to another pharmacy. (Read below.)  New 2023 regulation: "October 19, 2021 Revised Regulation Allows DEA-Registered Pharmacies to Transfer Electronic Prescriptions at a Patient's Request DEA Headquarters Division - Public Information Office Patients now have the ability to request their electronic prescription be transferred to another pharmacy without having to go back to their practitioner to initiate the request. This revised regulation went into effect on Monday, October 15, 2021.     At a patient's request, a DEA-registered retail pharmacy can now transfer an electronic prescription for a controlled substance (schedules II-V) to another DEA-registered retail pharmacy. Prior to this change, patients would have to go through their practitioner to cancel their prescription and have it re-issued to a different pharmacy. The process was taxing and time consuming for both patients and practitioners.    The Drug Enforcement Administration (DEA) published its intent to revise the process for transferring electronic prescriptions on January 07, 2020.  The final rule was published in the federal register on September 13, 2021 and went into effect 30 days later.  Under the final rule, a prescription can only be transferred once between pharmacies, and only if allowed under existing state or other applicable law. The prescription must  remain in its electronic form; may not be altered in any way; and the transfer must be communicated directly between two licensed pharmacists. It's important to note, any authorized refills transfer with the original prescription, which means the entire prescription will be filled at the same pharmacy".  Reference: https://www.dea.gov/stories/2023/2023-10/2021-09-01/revised-regulation-allows-dea-registered-pharmacies-transfer (DEA website announcement)  https://www.govinfo.gov/content/pkg/FR-2021-09-13/pdf/2023-15847.pdf (Federal Register  Department of Justice)   Federal Register / Vol. 88, No. 143 / Thursday, September 13, 2021 / Rules and Regulations DEPARTMENT OF JUSTICE  Drug Enforcement Administration  21 CFR Part 1306  [Docket No. [id number]]  RIN 1117-AB64 Transfer of Electronic Prescriptions for Schedules II-V Controlled Substances Between Pharmacies for Initial Filling  ____________________________________________________________________________________________     ____________________________________________________________________________________________  Transfer of Pain Medication between Pharmacies  Re: 2023 DEA Clarification on existing regulation  Published on DEA Website: October 19, 2021  Title: Revised Regulation Allows DEA-Registered Pharmacies to Transfer Electronic Prescriptions at a Patient's Request DEA Headquarters Division - Public Information Office  "Patients now have the ability to request their electronic prescription be transferred to another pharmacy without having to go back to their practitioner to initiate the request. This revised regulation went into effect on Monday, October 15, 2021.     At a patient's request, a DEA-registered retail pharmacy can now transfer an electronic prescription for a controlled substance (schedules II-V) to another DEA-registered retail pharmacy. Prior to this change, patients would have to go through their practitioner to  cancel their prescription   and have it re-issued to a different pharmacy. The process was taxing and time consuming for both patients and practitioners.    The Drug Enforcement Administration (DEA) published its intent to revise the process for transferring electronic prescriptions on January 07, 2020.  The final rule was published in the federal register on September 13, 2021 and went into effect 30 days later.  Under the final rule, a prescription can only be transferred once between pharmacies, and only if allowed under existing state or other applicable law. The prescription must remain in its electronic form; may not be altered in any way; and the transfer must be communicated directly between two licensed pharmacists. It's important to note, any authorized refills transfer with the original prescription, which means the entire prescription will be filled at the same pharmacy."    REFERENCES: 1. DEA website announcement https://www.dea.gov/stories/2023/2023-10/2021-09-01/revised-regulation-allows-dea-registered-pharmacies-transfer  2. Department of Justice website  https://www.govinfo.gov/content/pkg/FR-2021-09-13/pdf/2023-15847.pdf  3. DEPARTMENT OF JUSTICE Drug Enforcement Administration 21 CFR Part 1306 [Docket No. [id number]] RIN [id number]-AB64 "Transfer of Electronic Prescriptions for Schedules II-V Controlled Substances Between Pharmacies for Initial Filling"  ____________________________________________________________________________________________     _______________________________________________________________________  Medication Rules  Purpose: To inform patients, and their family members, of our medication rules and regulations.  Applies to: All patients receiving prescriptions from our practice (written or electronic).  Pharmacy of record: This is the pharmacy where your electronic prescriptions will be sent. Make sure we have the correct one.  Electronic prescriptions: In  compliance with the [place] Strengthen Opioid Misuse Prevention (STOP) Act of 2017 (Session Law 2017-74/H243), effective February 18, 2018, all controlled substances must be electronically prescribed. Written prescriptions, faxing, or calling prescriptions to a pharmacy will no longer be done.  Prescription refills: These will be provided only during in-person appointments. No medications will be renewed without a "face-to-face" evaluation with your provider. Applies to all prescriptions.  NOTE: The following applies primarily to controlled substances (Opioid* Pain Medications).   Type of encounter (visit): For patients receiving controlled substances, face-to-face visits are required. (Not an option and not up to the patient.)  Patient's responsibilities: Pain Pills: Bring all pain pills to every appointment (except for procedure appointments). Pill Bottles: Bring pills in original pharmacy bottle. Bring bottle, even if empty. Always bring the bottle of the most recent fill.  Medication refills: You are responsible for knowing and keeping track of what medications you are taking and when is it that you will need a refill. The day before your appointment: write a list of all prescriptions that need to be refilled. The day of the appointment: give the list to the admitting nurse. Prescriptions will be written only during appointments. No prescriptions will be written on procedure days. If you forget a medication: it will not be "Called in", "Faxed", or "electronically sent". You will need to get another appointment to get these prescribed. No early refills. Do not call asking to have your prescription filled early. Partial  or short prescriptions: Occasionally your pharmacy may not have enough pills to fill your prescription.  NEVER ACCEPT a partial fill or a prescription that is short of the total amount of pills that you were prescribed.  With controlled substances the law allows 72 hours for  the pharmacy to complete the prescription.  If the prescription is not completed within 72 hours, the pharmacist will require a new prescription to be written. This means that you will be short on your medicine and we WILL NOT send another prescription to complete your original   prescription.  Instead, request the pharmacy to send a carrier to a nearby branch to get enough medication to provide you with your full prescription. Prescription Accuracy: You are responsible for carefully inspecting your prescriptions before leaving our office. Have the discharge nurse carefully go over each prescription with you, before taking them home. Make sure that your name is accurately spelled, that your address is correct. Check the name and dose of your medication to make sure it is accurate. Check the number of pills, and the written instructions to make sure they are clear and accurate. Make sure that you are given enough medication to last until your next medication refill appointment. Taking Medication: Take medication as prescribed. When it comes to controlled substances, taking less pills or less frequently than prescribed is permitted and encouraged. Never take more pills than instructed. Never take the medication more frequently than prescribed.  Inform other Doctors: Always inform, all of your healthcare providers, of all the medications you take. Pain Medication from other Providers: You are not allowed to accept any additional pain medication from any other Doctor or Healthcare provider. There are two exceptions to this rule. (see below) In the event that you require additional pain medication, you are responsible for notifying us, as stated below. Cough Medicine: Often these contain an opioid, such as codeine or hydrocodone. Never accept or take cough medicine containing these opioids if you are already taking an opioid* medication. The combination may cause respiratory failure and death. Medication Agreement:  You are responsible for carefully reading and following our Medication Agreement. This must be signed before receiving any prescriptions from our practice. Safely store a copy of your signed Agreement. Violations to the Agreement will result in no further prescriptions. (Additional copies of our Medication Agreement are available upon request.) Laws, Rules, & Regulations: All patients are expected to follow all Federal and State Laws, Statutes, Rules, & Regulations. Ignorance of the Laws does not constitute a valid excuse.  Illegal drugs and Controlled Substances: The use of illegal substances (including, but not limited to marijuana and its derivatives) and/or the illegal use of any controlled substances is strictly prohibited. Violation of this rule may result in the immediate and permanent discontinuation of any and all prescriptions being written by our practice. The use of any illegal substances is prohibited. Adopted CDC guidelines & recommendations: Target dosing levels will be at or below 60 MME/day. Use of benzodiazepines** is not recommended.  Exceptions: There are only two exceptions to the rule of not receiving pain medications from other Healthcare Providers. Exception #1 (Emergencies): In the event of an emergency (i.e.: accident requiring emergency care), you are allowed to receive additional pain medication. However, you are responsible for: As soon as you are able, call our office [phone], at any time of the day or night, and leave a message stating your name, the date and nature of the emergency, and the name and dose of the medication prescribed. In the event that your call is answered by a member of our staff, make sure to document and save the date, time, and the name of the person that took your information.  Exception #2 (Planned Surgery): In the event that you are scheduled by another doctor or dentist to have any type of surgery or procedure, you are allowed (for a period no  longer than 30 days), to receive additional pain medication, for the acute post-op pain. However, in this case, you are responsible for picking up a copy of   our "Post-op Pain Management for Surgeons" handout, and giving it to your surgeon or dentist. This document is available at our office, and does not require an appointment to obtain it. Simply go to our office during business hours (Monday-Thursday from 8:00 AM to 4:00 PM) (Friday 8:00 AM to 12:00 Noon) or if you have a scheduled appointment with us, prior to your surgery, and ask for it by name. In addition, you are responsible for: calling our office [phone], at any time of the day or night, and leaving a message stating your name, name of your surgeon, type of surgery, and date of procedure or surgery. Failure to comply with your responsibilities may result in termination of therapy involving the controlled substances. Medication Agreement Violation. Following the above rules, including your responsibilities will help you in avoiding a Medication Agreement Violation ("Breaking your Pain Medication Contract").  Consequences:  Not following the above rules may result in permanent discontinuation of medication prescription therapy.  *Opioid medications include: morphine, codeine, oxycodone, oxymorphone, hydrocodone, hydromorphone, meperidine, tramadol, tapentadol, buprenorphine, fentanyl, methadone. **Benzodiazepine medications include: diazepam (Valium), alprazolam (Xanax), clonazepam (Klonopine), lorazepam (Ativan), clorazepate (Tranxene), chlordiazepoxide (Librium), estazolam (Prosom), oxazepam (Serax), temazepam (Restoril), triazolam (Halcion) (Last updated: 12/11/2021) ______________________________________________________________________    ______________________________________________________________________  Medication Recommendations and Reminders  Applies to: All patients receiving prescriptions (written and/or  electronic).  Medication Rules & Regulations: You are responsible for reading, knowing, and following our "Medication Rules" document. These exist for your safety and that of others. They are not flexible and neither are we. Dismissing or ignoring them is an act of "non-compliance" that may result in complete and irreversible termination of such medication therapy. For safety reasons, "non-compliance" will not be tolerated. As with the [place] fundamental legal principle of "ignorance of the law is no defense", we will accept no excuses for not having read and knowing the content of documents provided to you by our practice.  Pharmacy of record:  Definition: This is the pharmacy where your electronic prescriptions will be sent.  We do not endorse any particular pharmacy. It is up to you and your insurance to decide what pharmacy to use.  We do not restrict you in your choice of pharmacy. However, once we write for your prescriptions, we will NOT be re-sending more prescriptions to fix restricted supply problems created by your pharmacy, or your insurance.  The pharmacy listed in the electronic medical record should be the one where you want electronic prescriptions to be sent. If you choose to change pharmacy, simply notify our nursing staff. Changes will be made only during your regular appointments and not over the phone.  Recommendations: Keep all of your pain medications in a safe place, under lock and key, even if you live alone. We will NOT replace lost, stolen, or damaged medication. We do not accept "Police Reports" as proof of medications having been stolen. After you fill your prescription, take 1 week's worth of pills and put them away in a safe place. You should keep a separate, properly labeled bottle for this purpose. The remainder should be kept in the original bottle. Use this as your primary supply, until it runs out. Once it's gone, then you know that you have 1 week's worth of medicine,  and it is time to come in for a prescription refill. If you do this correctly, it is unlikely that you will ever run out of medicine. To make sure that the above recommendation works, it is very important that you make   sure your medication refill appointments are scheduled at least 1 week before you run out of medicine. To do this in an effective manner, make sure that you do not leave the office without scheduling your next medication management appointment. Always ask the nursing staff to show you in your prescription , when your medication will be running out. Then arrange for the receptionist to get you a return appointment, at least 7 days before you run out of medicine. Do not wait until you have 1 or 2 pills left, to come in. This is very poor planning and does not take into consideration that we may need to cancel appointments due to bad weather, sickness, or emergencies affecting our staff. DO NOT ACCEPT A "Partial Fill": If for any reason your pharmacy does not have enough pills/tablets to completely fill or refill your prescription, do not allow for a "partial fill". The law allows the pharmacy to complete that prescription within 72 hours, without requiring a new prescription. If they do not fill the rest of your prescription within those 72 hours, you will need a separate prescription to fill the remaining amount, which we will NOT provide. If the reason for the partial fill is your insurance, you will need to talk to the pharmacist about payment alternatives for the remaining tablets, but again, DO NOT ACCEPT A PARTIAL FILL, unless you can trust your pharmacist to obtain the remainder of the pills within 72 hours.  Prescription refills and/or changes in medication(s):  Prescription refills, and/or changes in dose or medication, will be conducted only during scheduled medication management appointments. (Applies to both, written and electronic prescriptions.) No refills on procedure days. No  medication will be changed or started on procedure days. No changes, adjustments, and/or refills will be conducted on a procedure day. Doing so will interfere with the diagnostic portion of the procedure. No phone refills. No medications will be "called into the pharmacy". No Fax refills. No weekend refills. No Holliday refills. No after hours refills.  Remember:  Business hours are:  Monday to Thursday 8:00 AM to 4:00 PM Provider's Schedule: Lashun Ramseyer, MD - Appointments are:  Medication management: Monday and Wednesday 8:00 AM to 4:00 PM Procedure day: Tuesday and Thursday 7:30 AM to 4:00 PM Bilal Lateef, MD - Appointments are:  Medication management: Tuesday and Thursday 8:00 AM to 4:00 PM Procedure day: Monday and Wednesday 7:30 AM to 4:00 PM (Last update: 12/11/2021) ______________________________________________________________________    ____________________________________________________________________________________________  Drug Holidays  What is a "Drug Holiday"? Drug Holiday: is the name given to the process of slowly tapering down and temporarily stopping the pain medication for the purpose of decreasing or eliminating tolerance to the drug.  Benefits Improved effectiveness Decreased required effective dose Improved pain control End dependence on high dose therapy Decrease cost of therapy Uncovering "opioid-induced hyperalgesia". (OIH)  What is "opioid hyperalgesia"? It is a paradoxical increase in pain caused by exposure to opioids. Stopping the opioid pain medication, contrary to the expected, it actually decreases or completely eliminates the pain. Ref.: "A comprehensive review of opioid-induced hyperalgesia". Marion Lee, et.al. Pain Physician. 2011 Mar-Apr;14(2):145-61.  What is tolerance? Tolerance: the progressive loss of effectiveness of a pain medicine due to repetitive use. A common problem of opioid pain medications.  How long should a "Drug  Holiday" last? Effectiveness depends on the patient staying off all opioid pain medicines for a minimum of 14 consecutive days. (2 weeks)  How about just taking less of the medicine? Does not   work. Will not accomplish goal of eliminating the excess receptors.  How about switching to a different pain medicine? (AKA. "Opioid rotation") Does not work. Creates the illusion of effectiveness by taking advantage of inaccurate equivalent dose calculations between different opioids. -This "technique" was promoted by studies funded by pharmaceutical companies, such as [employer], creators of "OxyContin".  Can I stop the medicine "cold turkey"? Depends. You should always coordinate with your Pain Specialist to make the transition as smoothly as possible. Avoid stopping the medicine abruptly without consulting. We recommend a "slow taper".  What is a slow taper? Taper: refers to the gradual decrease in dose.   How do I stop/taper the dose? Slowly. Decrease the daily amount of pills that you take by one (1) pill every seven (7) days. This is called a "slow downward taper". Example: if you normally take four (4) pills per day, drop it to three (3) pills per day for seven (7) days, then to two (2) pills per day for seven (7) days, then to one (1) per day for seven (7) days, and then stop the medicine. The 14 day "Drug Holiday" starts on the first day without medicine.   Will I experience withdrawals? Unlikely with a slow taper.  What triggers withdrawals? Withdrawals are triggered by the sudden/abrupt stop of high dose opioids. Withdrawals can be avoided by slowly decreasing the dose over a prolonged period of time.  What are withdrawals? Symptoms associated with sudden/abrupt reduction/stopping of high-dose, long-term use of pain medication. Withdrawal are seldom seen on low dose therapy, or patients rarely taking opioid medication.  Early Withdrawal Symptoms may include: Agitation Anxiety Muscle  aches Increased tearing Insomnia Runny nose Sweating Yawning  Late symptoms may include: Abdominal cramping Diarrhea Dilated pupils Goose bumps Nausea Vomiting  (Last update: 01/27/2022) ____________________________________________________________________________________________    ____________________________________________________________________________________________  WARNING: CBD (cannabidiol) & Delta (Delta-8 tetrahydrocannabinol) products.   Applicable to:  All individuals currently taking or considering taking CBD (cannabidiol) and, more important, all patients taking opioid analgesic controlled substances (pain medication). (Example: oxycodone; oxymorphone; hydrocodone; hydromorphone; morphine; methadone; tramadol; tapentadol; fentanyl; buprenorphine; butorphanol; dextromethorphan; meperidine; codeine; etc.)  Introduction:  Recently there has been a drive towards the use of "natural" products for the treatment of different conditions, including pain anxiety and sleep disorders. Marijuana and hemp are two varieties of the cannabis genus plants. Marijuana and its derivatives are illegal, while hemp and its derivatives are not. Cannabidiol (CBD) and tetrahydrocannabinol (THC), are two natural compounds found in plants of the Cannabis genus. They can both be extracted from hemp or marijuana. Both compounds interact with your body's endocannabinoid system in very different ways. CBD is associated with pain relief (analgesia) while THC is associated with the psychoactive effects ("the high") obtained from the use of marijuana products. There are two main types of THC: Delta-9, which comes from the marijuana plant and it is illegal, and Delta-8, which comes from the hemp plant, and it is legal. (Both, Delta-9-THC and Delta-8-THC are psychoactive and give you "the high".)   Legality:  Marijuana and its derivatives: illegal Hemp and its derivatives: Legal (State dependent) UPDATE:  (04/06/2021) The Drug Enforcement Agency (DEA) issued a letter stating that "delta" cannabinoids, including Delta-8-THCO and Delta-9-THCO, synthetically derived from hemp do not qualify as hemp and will be viewed as Schedule I drugs. (Schedule I drugs, substances, or chemicals are defined as drugs with no currently accepted medical use and a high potential for abuse. Some examples of Schedule I drugs are: heroin,   lysergic acid diethylamide (LSD), marijuana (cannabis), 3,4-methylenedioxymethamphetamine (ecstasy), methaqualone, and peyote.) (https://www.dea.gov)  Legal status of CBD in [place]:  "Conditionally Legal"  Reference: "FDA Regulation of Cannabis and Cannabis-Derived Products, Including Cannabidiol (CBD)" - https://www.fda.gov/news-events/public-health-focus/fda-regulation-cannabis-and-cannabis-derived-products-including-cannabidiol-cbd  Warning:  CBD is not FDA approved and has not undergo the same manufacturing controls as prescription drugs.  This means that the purity and safety of available CBD may be questionable. Most of the time, despite manufacturer's claims, it is contaminated with THC (delta-9-tetrahydrocannabinol - the chemical in marijuana responsible for the "HIGH").  When this is the case, the THC contaminant will trigger a positive urine drug screen (UDS) test for Marijuana (carboxy-THC).   The FDA recently put out a warning about 5 things that everyone should be aware of regarding Delta-8 THC: Delta-8 THC products have not been evaluated or approved by the FDA for safe use and may be marketed in ways that put the public health at risk. The FDA has received adverse event reports involving delta-8 THC-containing products. Delta-8 THC has psychoactive and intoxicating effects. Delta-8 THC manufacturing often involve use of potentially harmful chemicals to create the concentrations of delta-8 THC claimed in the marketplace. The final delta-8 THC product may have potentially harmful  by-products (contaminants) due to the chemicals used in the process. Manufacturing of delta-8 THC products may occur in uncontrolled or unsanitary settings, which may lead to the presence of unsafe contaminants or other potentially harmful substances. Delta-8 THC products should be kept out of the reach of children and pets.  NOTE: Because a positive UDS for any illicit substance is a violation of our medication agreement, your opioid analgesics (pain medicine) may be permanently discontinued.  MORE ABOUT CBD  General Information: CBD was discovered in 1940 and it is a derivative of the cannabis sativa genus plants (Marijuana and Hemp). It is one of the 113 identified substances found in Marijuana. It accounts for up to 40% of the plant's extract. As of 2018, preliminary clinical studies on CBD included research for the treatment of anxiety, movement disorders, and pain. CBD is available and consumed in multiple forms, including inhalation of smoke or vapor, as an aerosol spray, and by mouth. It may be supplied as an oil containing CBD, capsules, dried cannabis, or as a liquid solution. CBD is thought not to be as psychoactive as THC (delta-9-tetrahydrocannabinol - the chemical in marijuana responsible for the "HIGH"). Studies suggest that CBD may interact with different biological target receptors in the body, including cannabinoid and other neurotransmitter receptors. As of 2018 the mechanism of action for its biological effects has not been determined.  Side-effects  Adverse reactions: Dry mouth, diarrhea, decreased appetite, fatigue, drowsiness, malaise, weakness, sleep disturbances, and others.  Drug interactions:  CBD may interact with medications such as blood-thinners. CBD causes drowsiness on its own and it will increase drowsiness caused by other medications, including antihistamines (such as Benadryl), benzodiazepines (Xanax, Ativan, Valium), antipsychotics, antidepressants, opioids, alcohol  and supplements such as kava, melatonin and St. John's Wort.  Other drug interactions: Brivaracetam (Briviact); Caffeine; Carbamazepine (Tegretol); Citalopram (Celexa); Clobazam (Onfi); Eslicarbazepine (Aptiom); Everolimus (Zostress); Lithium; Methadone (Dolophine); Rufinamide (Banzel); Sedative medications (CNS depressants); Sirolimus (Rapamune); Stiripentol (Diacomit); Tacrolimus (Prograf); Tamoxifen ; Soltamox); Topiramate (Topamax); Valproate; Warfarin (Coumadin); Zonisamide. (Last update: 01/28/2022) ____________________________________________________________________________________________   ____________________________________________________________________________________________  Naloxone Nasal Spray  Why am I receiving this medication? [place] STOP ACT requires that all patients taking high dose opioids or at risk of opioids respiratory depression, be prescribed an opioid reversal agent, such as   Naloxone (AKA: Narcan).  What is this medication? NALOXONE (nal OX one) treats opioid overdose, which causes slow or shallow breathing, severe drowsiness, or trouble staying awake. Call emergency services after using this medication. You may need additional treatment. Naloxone works by reversing the effects of opioids. It belongs to a group of medications called opioid blockers.  COMMON BRAND NAME(S): Kloxxado, Narcan  What should I tell my care team before I take this medication? They need to know if you have any of these conditions: Heart disease Substance use disorder An unusual or allergic reaction to naloxone, other medications, foods, dyes, or preservatives Pregnant or trying to get pregnant Breast-feeding  When to use this medication? This medication is to be used for the treatment of respiratory depression (less than 8 breaths per minute) secondary to opioid overdose.   How to use this medication? This medication is for use in the nose. Lay the person on their back.  Support their neck with your hand and allow the head to tilt back before giving the medication. The nasal spray should be given into 1 nostril. After giving the medication, move the person onto their side. Do not remove or test the nasal spray until ready to use. Get emergency medical help right away after giving the first dose of this medication, even if the person wakes up. You should be familiar with how to recognize the signs and symptoms of a narcotic overdose. If more doses are needed, give the additional dose in the other nostril. Talk to your care team about the use of this medication in children. While this medication may be prescribed for children as young as newborns for selected conditions, precautions do apply.  Naloxone Overdosage: If you think you have taken too much of this medicine contact a poison control center or emergency room at once.  NOTE: This medicine is only for you. Do not share this medicine with others.  What if I miss a dose? This does not apply.  What may interact with this medication? This is only used during an emergency. No interactions are expected during emergency use. This list may not describe all possible interactions. Give your health care provider a list of all the medicines, herbs, non-prescription drugs, or dietary supplements you use. Also tell them if you smoke, drink alcohol, or use illegal drugs. Some items may interact with your medicine.  What should I watch for while using this medication? Keep this medication ready for use in the case of an opioid overdose. Make sure that you have the phone number of your care team and local hospital ready. You may need to have additional doses of this medication. Each nasal spray contains a single dose. Some emergencies may require additional doses. After use, bring the treated person to the nearest hospital or call 911. Make sure the treating care team knows that the person has received a dose of this medication.  You will receive additional instructions on what to do during and after use of this medication before an emergency occurs.  What side effects may I notice from receiving this medication? Side effects that you should report to your care team as soon as possible: Allergic reactions--skin rash, itching, hives, swelling of the face, lips, tongue, or throat Side effects that usually do not require medical attention (report these to your care team if they continue or are bothersome): Constipation Dryness or irritation inside the nose Headache Increase in blood pressure Muscle spasms Stuffy nose Toothache This list may not   describe all possible side effects. Call your doctor for medical advice about side effects. You may report side effects to FDA at 1-800-FDA-1088.  Where should I keep my medication? Because this is an emergency medication, you should keep it with you at all times.  Keep out of the reach of children and pets. Store between 20 and 25 degrees C (68 and 77 degrees F). Do not freeze. Throw away any unused medication after the expiration date. Keep in original box until ready to use.  NOTE: This sheet is a summary. It may not cover all possible information. If you have questions about this medicine, talk to your doctor, pharmacist, or health care provider.   2023 [employer]/Gold Standard (2020-10-13 00:00:00)  ____________________________________________________________________________________________   

## 2022-05-06 ENCOUNTER — Ambulatory Visit: Payer: Medicare Other | Attending: Pain Medicine | Admitting: Pain Medicine

## 2022-05-06 ENCOUNTER — Encounter: Payer: Self-pay | Admitting: Pain Medicine

## 2022-05-06 VITALS — BP 160/77 | HR 112 | Temp 97.7°F | Resp 16 | Ht 69.0 in | Wt 220.0 lb

## 2022-05-06 DIAGNOSIS — Z79899 Other long term (current) drug therapy: Secondary | ICD-10-CM

## 2022-05-06 DIAGNOSIS — M545 Low back pain, unspecified: Secondary | ICD-10-CM | POA: Diagnosis present

## 2022-05-06 DIAGNOSIS — M961 Postlaminectomy syndrome, not elsewhere classified: Secondary | ICD-10-CM

## 2022-05-06 DIAGNOSIS — M79602 Pain in left arm: Secondary | ICD-10-CM

## 2022-05-06 DIAGNOSIS — Z79891 Long term (current) use of opiate analgesic: Secondary | ICD-10-CM

## 2022-05-06 DIAGNOSIS — M47812 Spondylosis without myelopathy or radiculopathy, cervical region: Secondary | ICD-10-CM

## 2022-05-06 DIAGNOSIS — Z7901 Long term (current) use of anticoagulants: Secondary | ICD-10-CM

## 2022-05-06 DIAGNOSIS — M549 Dorsalgia, unspecified: Secondary | ICD-10-CM

## 2022-05-06 DIAGNOSIS — M47894 Other spondylosis, thoracic region: Secondary | ICD-10-CM | POA: Diagnosis present

## 2022-05-06 DIAGNOSIS — G8929 Other chronic pain: Secondary | ICD-10-CM | POA: Diagnosis present

## 2022-05-06 DIAGNOSIS — M7918 Myalgia, other site: Secondary | ICD-10-CM | POA: Diagnosis present

## 2022-05-06 DIAGNOSIS — M79605 Pain in left leg: Secondary | ICD-10-CM | POA: Diagnosis present

## 2022-05-06 DIAGNOSIS — R296 Repeated falls: Secondary | ICD-10-CM

## 2022-05-06 DIAGNOSIS — M25562 Pain in left knee: Secondary | ICD-10-CM | POA: Insufficient documentation

## 2022-05-06 DIAGNOSIS — G894 Chronic pain syndrome: Secondary | ICD-10-CM

## 2022-05-06 MED ORDER — OXYCODONE HCL 5 MG PO TABS: 5.0000 mg | ORAL_TABLET | Freq: Every day | ORAL | 0 refills | Status: DC

## 2022-05-06 MED ORDER — OXYCODONE HCL 5 MG PO TABS: 5.0000 mg | ORAL_TABLET | Freq: Three times a day (TID) | ORAL | 0 refills | Status: DC

## 2022-05-06 NOTE — Progress Notes (Signed)
Nursing Pain Medication Assessment:  Safety precautions to be maintained throughout the outpatient stay will include: orient to surroundings, keep bed in low position, maintain call bell within reach at all times, provide assistance with transfer out of bed and ambulation.  Medication Inspection Compliance: Pill count conducted under aseptic conditions, in front of the patient. Neither the pills nor the bottle was removed from the patient's sight at any time. Once count was completed pills were immediately returned to the patient in their original bottle.  Medication #1: Oxycodone IR Pill/Patch Count:  36 of 150 pills remain Pill/Patch Appearance: Markings consistent with prescribed medication Bottle Appearance: Standard pharmacy container. Clearly labeled. Filled Date: 01 / 29 / 2024 Last Medication intake:  Today  Medication #2: Oxycodone IR Pill/Patch Count:  150 of 150 pills remain Pill/Patch Appearance: Markings consistent with prescribed medication Bottle Appearance: Standard pharmacy container. Clearly labeled. Filled Date: 03 / 04 / 2024 Last Medication intake:   has not started on this Rx,

## 2022-05-16 ENCOUNTER — Telehealth: Payer: Self-pay | Admitting: Pain Medicine

## 2022-05-16 NOTE — Telephone Encounter (Signed)
Patient returned call. I instructed him that if he wants to discuss pain medication restart, he will need to make an evaluation appointment with Dr. Dossie Arbour. Patient also scheduled to have knee scan done of his knee and can discuss results at this same appointment. Patient  agrees with plan.

## 2022-05-16 NOTE — Telephone Encounter (Signed)
PT stated that he has been off his meds since 04-29-22. PT stated that he has been fine until today and states that he needs some. PT wanted to know when can he start back taking his prescription. Please give patient a call, TY

## 2022-05-16 NOTE — Telephone Encounter (Signed)
Called patient back.  No answer.  LVM.

## 2022-05-18 ENCOUNTER — Ambulatory Visit
Admission: RE | Admit: 2022-05-18 | Discharge: 2022-05-18 | Disposition: A | Discharge status: Home / Self Care | Payer: Medicare Other | Source: Ambulatory Visit | Attending: Pain Medicine | Admitting: Pain Medicine

## 2022-05-18 DIAGNOSIS — R296 Repeated falls: Secondary | ICD-10-CM | POA: Diagnosis present

## 2022-05-18 DIAGNOSIS — Z7901 Long term (current) use of anticoagulants: Secondary | ICD-10-CM | POA: Insufficient documentation

## 2022-05-18 DIAGNOSIS — M25562 Pain in left knee: Secondary | ICD-10-CM | POA: Diagnosis present

## 2022-05-18 DIAGNOSIS — G8929 Other chronic pain: Secondary | ICD-10-CM | POA: Diagnosis present

## 2022-05-21 NOTE — Patient Instructions (Incomplete)
____________________________________________________________________________________________  Opioid Pain Medication Update  To: All patients taking opioid pain medications. (I.e.: hydrocodone, hydromorphone, oxycodone, oxymorphone, morphine, codeine, methadone, tapentadol, tramadol, buprenorphine, fentanyl, etc.)  Re: Updated review of side effects and adverse reactions of opioid analgesics, as well as new information about long term effects of this class of medications.  Direct risks of long-term opioid therapy are not limited to opioid addiction and overdose. Potential medical risks include serious fractures, breathing problems during sleep, hyperalgesia, immunosuppression, chronic constipation, bowel obstruction, myocardial infarction, and tooth decay secondary to xerostomia.  Unpredictable adverse effects that can occur even if you take your medication correctly: Cognitive impairment, respiratory depression, and death. Most people think that if they take their medication "correctly", and "as instructed", that they will be safe. Nothing could be farther from the truth. In reality, a significant amount of recorded deaths associated with the use of opioids has occurred in individuals that had taken the medication for a long time, and were taking their medication correctly. The following are examples of how this can happen: Patient taking his/her medication for a long time, as instructed, without any side effects, is given a certain antibiotic or another unrelated medication, which in turn triggers a "Drug-to-drug interaction" leading to disorientation, cognitive impairment, impaired reflexes, respiratory depression or an untoward event leading to serious bodily harm or injury, including death.  Patient taking his/her medication for a long time, as instructed, without any side effects, develops an acute impairment of liver and/or kidney function. This will lead to a rapid inability of the body to  breakdown and eliminate their pain medication, which will result in effects similar to an "overdose", but with the same medicine and dose that they had always taken. This again may lead to disorientation, cognitive impairment, impaired reflexes, respiratory depression or an untoward event leading to serious bodily harm or injury, including death.  A similar problem will occur with patients as they grow older and their liver and kidney function begins to decrease as part of the aging process.  Background information: Historically, the original case for using long-term opioid therapy to treat chronic noncancer pain was based on safety assumptions that subsequent experience has called into question. In 1996, the American Pain Society and the [place] [employer] issued a consensus statement supporting long-term opioid therapy. This statement acknowledged the dangers of opioid prescribing but concluded that the risk for addiction was low; respiratory depression induced by opioids was short-lived, occurred mainly in opioid-naive patients, and was antagonized by pain; tolerance was not a common problem; and efforts to control diversion should not constrain opioid prescribing. This has now proven to be wrong. Experience regarding the risks for opioid addiction, misuse, and overdose in community practice has failed to support these assumptions.  According to the Centers for Disease Control and Prevention, fatal overdoses involving opioid analgesics have increased sharply over the past decade. Currently, more than 96,700 people die from drug overdoses every year. Opioids are a factor in 7 out of every 10 overdose deaths. Deaths from drug overdose have surpassed motor vehicle accidents as the leading cause of death for individuals between the ages of 3 and 36.  Clinical data suggest that neuroendocrine dysfunction may be very common in both men and women, potentially causing hypogonadism, erectile  dysfunction, infertility, decreased libido, osteoporosis, and depression. Recent studies linked higher opioid dose to increased opioid-related mortality. Controlled observational studies reported that long-term opioid therapy may be associated with increased risk for cardiovascular events. Subsequent meta-analysis concluded  that the safety of long-term opioid therapy in elderly patients has not been proven.   Side Effects and adverse reactions: Common side effects: Drowsiness (sedation). Dizziness. Nausea and vomiting. Constipation. Physical dependence -- Dependence often manifests with withdrawal symptoms when opioids are discontinued or decreased. Tolerance -- As you take repeated doses of opioids, you require increased medication to experience the same effect of pain relief. Respiratory depression -- This can occur in healthy people, especially with higher doses. However, people with COPD, asthma or other lung conditions may be even more susceptible to fatal respiratory impairment.  Uncommon side effects: An increased sensitivity to feeling pain and extreme response to pain (hyperalgesia). Chronic use of opioids can lead to this. Delayed gastric emptying (the process by which the contents of your stomach are moved into your small intestine). Muscle rigidity. Immune system and hormonal dysfunction. Quick, involuntary muscle jerks (myoclonus). Arrhythmia. Itchy skin (pruritus). Dry mouth (xerostomia).  Long-term side effects: Chronic constipation. Sleep-disordered breathing (SDB). Increased risk of bone fractures. Hypothalamic-pituitary-adrenal dysregulation. Increased risk of overdose.  RISKS: Fractures and Falls:  Opioids increase the risk and incidence of falls. This is of particular importance in elderly patients.  Endocrine System:  Long-term administration is associated with endocrine abnormalities (endocrinopathies). (Also known as Opioid-induced Endocrinopathy) Influences  on both the hypothalamic-pituitary-adrenal axis?and the hypothalamic-pituitary-gonadal axis have been demonstrated with consequent hypogonadism and adrenal insufficiency in both sexes. Hypogonadism and decreased levels of dehydroepiandrosterone sulfate have been reported in men and women. Endocrine effects include: Amenorrhoea in women (abnormal absence of menstruation) Reduced libido in both sexes Decreased sexual function Erectile dysfunction in men Hypogonadisms (decreased testicular function with shrinkage of testicles) Infertility Depression and fatigue Loss of muscle mass Anxiety Depression Immune suppression Hyperalgesia Weight gain Anemia Osteoporosis Patients (particularly women of childbearing age) should avoid opioids. There is insufficient evidence to recommend routine monitoring of asymptomatic patients taking opioids in the long-term for hormonal deficiencies.  Immune System: Human studies have demonstrated that opioids have an immunomodulating effect. These effects are mediated via opioid receptors both on immune effector cells and in the central nervous system. Opioids have been demonstrated to have adverse effects on antimicrobial response and anti-tumour surveillance. Buprenorphine has been demonstrated to have no impact on immune function.  Opioid Induced Hyperalgesia: Human studies have demonstrated that prolonged use of opioids can lead to a state of abnormal pain sensitivity, sometimes called opioid induced hyperalgesia (OIH). Opioid induced hyperalgesia is not usually seen in the absence of tolerance to opioid analgesia. Clinically, hyperalgesia may be diagnosed if the patient on long-term opioid therapy presents with increased pain. This might be qualitatively and anatomically distinct from pain related to disease progression or to breakthrough pain resulting from development of opioid tolerance. Pain associated with hyperalgesia tends to be more diffuse than the  pre-existing pain and less defined in quality. Management of opioid induced hyperalgesia requires opioid dose reduction.  Cancer: Chronic opioid therapy has been associated with an increased risk of cancer among noncancer patients with chronic pain. This association was more evident in chronic strong opioid users. Chronic opioid consumption causes significant pathological changes in the small intestine and colon. Epidemiological studies have found that there is a link between opium dependence and initiation of gastrointestinal cancers. Cancer is the second leading cause of death after cardiovascular disease. Chronic use of opioids can cause multiple conditions such as GERD, immunosuppression and renal damage as well as carcinogenic effects, which are associated with the incidence of cancers.   Mortality: Long-term opioid use   has been associated with increased mortality among patients with chronic non-cancer pain (CNCP).  Prescription of long-acting opioids for chronic noncancer pain was associated with a significantly increased risk of all-cause mortality, including deaths from causes other than overdose.  Reference: Von Korff M, Kolodny A, Deyo RA, Chou R. Long-term opioid therapy reconsidered. Ann Intern Med. 2011 Sep 6;155(5):325-8. doi: 10.7326/[id number]. PMID: [id number]; PMCIDXX:1631110. Morley Kos, Hayward RA, Dunn KM, [place] KP. Risk of adverse events in patients prescribed long-term opioids: A cohort study in the [place] Clinical Practice Research D[employer]. Eur J Pain. 2019 May;23(5):908-922. doi: 10.1002/ejp.1357. Epub 2019 Jan 31. PMID: [id number]. Colameco S, Coren JS, Ciervo CA. Continuous opioid treatment for chronic noncancer pain: a time for moderation in prescribing. Postgrad Med. 2009 Jul;121(4):61-6. doi: 10.3810/pgm.2009.07.2032. PMID: [id number]. Heywood Bene RN, [place] SD, Blazina I, Rosalio Loud, Bougatsos C, Deyo RA. The  effectiveness and risks of long-term opioid therapy for chronic pain: a systematic review for a [employer] of Health Pathways to [employer]. Ann Intern Med. 2015 Feb 17;162(4):276-86. doi: [id number]. PMID: [id number]. Marjory Sneddon [facility], [place] DM. NCHS Data Brief No. 22. [place]: Centers for Disease Control and Prevention; 2009. Sep, Increase in Fatal Poisonings Involving Opioid Analgesics in the [place], 1999-2006. Song IA, Choi HR, Oh TK. Long-term opioid use and mortality in patients with chronic non-cancer pain: Ten-year follow-up study in [place] from 2010 through 2019. EClinicalMedicine. 2022 Jul 18;51:101558. doi: 10.1016/j.eclinm.2022.UB:5887891. PMID: [id number]; PMCIDOX:8550940. Huser, W., Schubert, T., Vogelmann, T. et al. All-cause mortality in patients with long-term opioid therapy compared with non-opioid analgesics for chronic non-cancer pain: a database study. [place] Med 18, 162 (2020). [email or web address] Rashidian H, Roxy Cedar, Malekzadeh R, Haghdoost AA. An Ecological Study of the Association between Opiate Use and Incidence of Cancers. Addict Health. 2016 Fall;8(4):252-260. PMID: [id number]; PMCIDQI:9185013.  Our Goal: Our goal is to control your pain with means other than the use of opioid pain medications.  Our Recommendation: Talk to your physician about coming off of these medications. We can assist you with the tapering down and stopping these medicines. Based on the new information, even if you cannot completely stop the medication, a decrease in the dose may be associated with a lesser risk. Ask for other means of controlling the pain. Decrease or eliminate those factors that significantly contribute to your pain such as smoking, obesity, and a diet heavily tilted towards "inflammatory" nutrients.  Last Updated: 04/17/2022    ____________________________________________________________________________________________     ____________________________________________________________________________________________  Patient Information update  To: All of our patients.  Re: Name change.  It has been made official that our current name, "[facility]"   will soon be changed to "[facility]".   The purpose of this change is to eliminate any confusion created by the concept of our practice being a "Medication Management Pain Clinic". In the past this has led to the misconception that we treat pain primarily by the use of prescription medications.  Nothing can be farther from the truth.   Understanding PAIN MANAGEMENT: To further understand what our practice does, you first have to understand that "Pain Management" is a subspecialty that requires additional training once a physician has completed their specialty training, which can be in either Anesthesia, Neurology, Psychiatry, or Physical Medicine and Rehabilitation (PMR). Each one of these contributes to the final approach taken by each physician to  the management of their patient's pain. To be a "Pain Management Specialist" you must have first completed one of the specialty trainings below.  Anesthesiologists - trained in clinical pharmacology and interventional techniques such as nerve blockade and regional as well as central neuroanatomy. They are trained to block pain before, during, and after surgical interventions.  Neurologists - trained in the diagnosis and pharmacological treatment of complex neurological conditions, such as Multiple Sclerosis, Parkinson's, spinal cord injuries, and other systemic conditions that may be associated with symptoms that may include but are not limited to pain. They tend to rely primarily on the treatment of chronic pain  using prescription medications.  Psychiatrist - trained in conditions affecting the psychosocial wellbeing of patients including but not limited to depression, anxiety, schizophrenia, personality disorders, addiction, and other substance use disorders that may be associated with chronic pain. They tend to rely primarily on the treatment of chronic pain using prescription medications.   Physical Medicine and Rehabilitation (PMR) physicians, also known as physiatrists - trained to treat a wide variety of medical conditions affecting the brain, spinal cord, nerves, bones, joints, ligaments, muscles, and tendons. Their training is primarily aimed at treating patients that have suffered injuries that have caused severe physical impairment. Their training is primarily aimed at the physical therapy and rehabilitation of those patients. They may also work alongside orthopedic surgeons or neurosurgeons using their expertise in assisting surgical patients to recover after their surgeries.  INTERVENTIONAL PAIN MANAGEMENT is sub-subspecialty of Pain Management.  Our physicians are Board-certified in Anesthesia, Pain Management, and Interventional Pain Management.  This meaning that not only have they been trained and Board-certified in their specialty of Anesthesia, and subspecialty of Pain Management, but they have also received further training in the sub-subspecialty of Interventional Pain Management, in order to become Board-certified as INTERVENTIONAL PAIN MANAGEMENT SPECIALIST.    Mission: Our goal is to use our skills in  [place] as alternatives to the chronic use of prescription opioid medications for the treatment of pain. To make this more clear, we have changed our name to reflect what we do and offer. We will continue to offer medication management assessment and recommendations, but we will not be taking over any patient's medication  management.  ____________________________________________________________________________________________     ____________________________________________________________________________________________  National Pain Medication Shortage  The [place] is experiencing worsening drug shortages. These have had a negative widespread effect on patient care and treatment. Not expected to improve any time soon. Predicted to last past 2029.   Drug shortage list (generic names) Oxycodone IR Oxycodone/APAP Oxymorphone IR Hydromorphone Hydrocodone/APAP Morphine  Where is the problem?  Manufacturing and supply level.  Will this shortage affect you?  Only if you take any of the above pain medications.  How? You may be unable to fill your prescription.  Your pharmacist may offer a "partial fill" of your prescription. (Warning: Do not accept partial fills.) Prescriptions partially filled cannot be transferred to another pharmacy. Read our Medication Rules and Regulation. Depending on how much medicine you are dependent on, you may experience withdrawals when unable to get the medication.  Recommendations: Consider ending your dependence on opioid pain medications. Ask your pain specialist to assist you with the process. Consider switching to a medication currently not in shortage, such as Buprenorphine. Talk to your pain specialist about this option. Consider decreasing your pain medication requirements by managing tolerance thru "Drug Holidays". This may help minimize withdrawals, should you run out of medicine. Control your pain thru  the use of non-pharmacological interventional therapies.   Your prescriber: Prescribers cannot be blamed for shortages. Medication manufacturing and supply issues cannot be fixed by the prescriber.   NOTE: The prescriber is not responsible for supplying the medication, or solving supply issues. Work with your pharmacist to solve it. The patient is responsible for  the decision to take or continue taking the medication and for identifying and securing a legal supply source. By law, supplying the medication is the job and responsibility of the pharmacy. The prescriber is responsible for the evaluation, monitoring, and prescribing of these medications.   Prescribers will NOT: Re-issue prescriptions that have been partially filled. Re-issue prescriptions already sent to a pharmacy.  Re-send prescriptions to a different pharmacy because yours did not have your medication. Ask pharmacist to order more medicine or transfer the prescription to another pharmacy. (Read below.)  New 2023 regulation: "October 19, 2021 Revised Regulation Allows DEA-Registered Pharmacies to Transfer Electronic Prescriptions at a Patient's Request [place] Patients now have the ability to request their electronic prescription be transferred to another pharmacy without having to go back to their practitioner to initiate the request. This revised regulation went into effect on Monday, October 15, 2021.     At a patient's request, a DEA-registered retail pharmacy can now transfer an electronic prescription for a controlled substance (schedules II-V) to another DEA-registered retail pharmacy. Prior to this change, patients would have to go through their practitioner to cancel their prescription and have it re-issued to a different pharmacy. The process was taxing and time consuming for both patients and practitioners.    The Drug Enforcement Administration [facility]) published its intent to revise the process for transferring electronic prescriptions on January 07, 2020.  The final rule was published in the federal register on September 13, 2021 and went into effect 30 days later.  Under the final rule, a prescription can only be transferred once between pharmacies, and only if allowed under existing state or other applicable law. The prescription must  remain in its electronic form; may not be altered in any way; and the transfer must be communicated directly between two licensed pharmacists. It's important to note, any authorized refills transfer with the original prescription, which means the entire prescription will be filled at the same pharmacy".  Reference: [employer] [facility] website announcement)  [email or web address].pdf ([place])   [employer] / Vol. 88, No. 143 / Thursday, September 13, 2021 / Rules and Regulations DEPARTMENT OF JUSTICE  Drug Enforcement Administration  21 CFR Part 1306  [Docket No. [id number]]  RIN [id number] Transfer of Electronic Prescriptions for Schedules II-V Controlled Substances Between Pharmacies for Initial Filling  ____________________________________________________________________________________________     ____________________________________________________________________________________________  Transfer of Pain Medication between Pharmacies  Re: 2023 DEA Clarification on existing regulation  Published on DEA Website: October 19, 2021  Title: Revised Regulation Allows DEA-Registered Pharmacies to Conservator, museum/gallery Prescriptions at a Patient's Request [place]  "Patients now have the ability to request their electronic prescription be transferred to another pharmacy without having to go back to their practitioner to initiate the request. This revised regulation went into effect on Monday, October 15, 2021.     At a patient's request, a DEA-registered retail pharmacy can now transfer an electronic prescription for a controlled substance (schedules II-V) to another DEA-registered retail pharmacy. Prior to this change, patients would have to go through their practitioner to  cancel their prescription  and have it re-issued to a different pharmacy. The process was taxing and time consuming for both patients and practitioners.    The Drug Enforcement Administration (DEA) published its intent to revise the process for transferring electronic prescriptions on January 07, 2020.  The final rule was published in the federal register on September 13, 2021 and went into effect 30 days later.  Under the final rule, a prescription can only be transferred once between pharmacies, and only if allowed under existing state or other applicable law. The prescription must remain in its electronic form; may not be altered in any way; and the transfer must be communicated directly between two licensed pharmacists. It's important to note, any authorized refills transfer with the original prescription, which means the entire prescription will be filled at the same pharmacy."    REFERENCES: 1. DEA website announcement https://www.dea.gov/stories/2023/2023-10/2021-09-01/revised-regulation-allows-dea-registered-pharmacies-transfer  2. Department of Justice website  https://www.govinfo.gov/content/pkg/FR-2021-09-13/pdf/2023-15847.pdf  3. DEPARTMENT OF JUSTICE Drug Enforcement Administration 21 CFR Part 1306 [Docket No. [id number]] RIN [id number] "Transfer of Electronic Prescriptions for Schedules II-V Controlled Substances Between Pharmacies for Initial Filling"  ____________________________________________________________________________________________     _______________________________________________________________________  Medication Rules  Purpose: To inform patients, and their family members, of our medication rules and regulations.  Applies to: All patients receiving prescriptions from our practice (written or electronic).  Pharmacy of record: This is the pharmacy where your electronic prescriptions will be sent. Make sure we have the correct one.  Electronic prescriptions: In  compliance with the [place] Strengthen Opioid Misuse Prevention (STOP) Act of 2017 (Session Law 2017-74/H243), effective February 18, 2018, all controlled substances must be electronically prescribed. Written prescriptions, faxing, or calling prescriptions to a pharmacy will no longer be done.  Prescription refills: These will be provided only during in-person appointments. No medications will be renewed without a "face-to-face" evaluation with your provider. Applies to all prescriptions.  NOTE: The following applies primarily to controlled substances (Opioid* Pain Medications).   Type of encounter (visit): For patients receiving controlled substances, face-to-face visits are required. (Not an option and not up to the patient.)  Patient's responsibilities: Pain Pills: Bring all pain pills to every appointment (except for procedure appointments). Pill Bottles: Bring pills in original pharmacy bottle. Bring bottle, even if empty. Always bring the bottle of the most recent fill.  Medication refills: You are responsible for knowing and keeping track of what medications you are taking and when is it that you will need a refill. The day before your appointment: write a list of all prescriptions that need to be refilled. The day of the appointment: give the list to the admitting nurse. Prescriptions will be written only during appointments. No prescriptions will be written on procedure days. If you forget a medication: it will not be "Called in", "Faxed", or "electronically sent". You will need to get another appointment to get these prescribed. No early refills. Do not call asking to have your prescription filled early. Partial  or short prescriptions: Occasionally your pharmacy may not have enough pills to fill your prescription.  NEVER ACCEPT a partial fill or a prescription that is short of the total amount of pills that you were prescribed.  With controlled substances the law allows 72 hours for  the pharmacy to complete the prescription.  If the prescription is not completed within 72 hours, the pharmacist will require a new prescription to be written. This means that you will be short on your medicine and we WILL NOT send another prescription to complete your original   prescription.  Instead, request the pharmacy to send a carrier to a nearby branch to get enough medication to provide you with your full prescription. Prescription Accuracy: You are responsible for carefully inspecting your prescriptions before leaving our office. Have the discharge nurse carefully go over each prescription with you, before taking them home. Make sure that your name is accurately spelled, that your address is correct. Check the name and dose of your medication to make sure it is accurate. Check the number of pills, and the written instructions to make sure they are clear and accurate. Make sure that you are given enough medication to last until your next medication refill appointment. Taking Medication: Take medication as prescribed. When it comes to controlled substances, taking less pills or less frequently than prescribed is permitted and encouraged. Never take more pills than instructed. Never take the medication more frequently than prescribed.  Inform other Doctors: Always inform, all of your healthcare providers, of all the medications you take. Pain Medication from other Providers: You are not allowed to accept any additional pain medication from any other Doctor or Healthcare provider. There are two exceptions to this rule. (see below) In the event that you require additional pain medication, you are responsible for notifying us, as stated below. Cough Medicine: Often these contain an opioid, such as codeine or hydrocodone. Never accept or take cough medicine containing these opioids if you are already taking an opioid* medication. The combination may cause respiratory failure and death. Medication Agreement:  You are responsible for carefully reading and following our Medication Agreement. This must be signed before receiving any prescriptions from our practice. Safely store a copy of your signed Agreement. Violations to the Agreement will result in no further prescriptions. (Additional copies of our Medication Agreement are available upon request.) Laws, Rules, & Regulations: All patients are expected to follow all Federal and State Laws, Statutes, Rules, & Regulations. Ignorance of the Laws does not constitute a valid excuse.  Illegal drugs and Controlled Substances: The use of illegal substances (including, but not limited to marijuana and its derivatives) and/or the illegal use of any controlled substances is strictly prohibited. Violation of this rule may result in the immediate and permanent discontinuation of any and all prescriptions being written by our practice. The use of any illegal substances is prohibited. Adopted CDC guidelines & recommendations: Target dosing levels will be at or below 60 MME/day. Use of benzodiazepines** is not recommended.  Exceptions: There are only two exceptions to the rule of not receiving pain medications from other Healthcare Providers. Exception #1 (Emergencies): In the event of an emergency (i.e.: accident requiring emergency care), you are allowed to receive additional pain medication. However, you are responsible for: As soon as you are able, call our office [phone], at any time of the day or night, and leave a message stating your name, the date and nature of the emergency, and the name and dose of the medication prescribed. In the event that your call is answered by a member of our staff, make sure to document and save the date, time, and the name of the person that took your information.  Exception #2 (Planned Surgery): In the event that you are scheduled by another doctor or dentist to have any type of surgery or procedure, you are allowed (for a period no  longer than 30 days), to receive additional pain medication, for the acute post-op pain. However, in this case, you are responsible for picking up a copy of   our "Post-op Pain Management for Surgeons" handout, and giving it to your surgeon or dentist. This document is available at our office, and does not require an appointment to obtain it. Simply go to our office during business hours (Monday-Thursday from 8:00 AM to 4:00 PM) (Friday 8:00 AM to 12:00 Noon) or if you have a scheduled appointment with [name], prior to your surgery, and ask for it by name. In addition, you are responsible for: calling our office [phone], at any time of the day or night, and leaving a message stating your name, name of your surgeon, type of surgery, and date of procedure or surgery. Failure to comply with your responsibilities may result in termination of therapy involving the controlled substances. Medication Agreement Violation. Following the above rules, including your responsibilities will help you in avoiding a Medication Agreement Violation ("Breaking your Pain Medication Contract").  Consequences:  Not following the above rules may result in permanent discontinuation of medication prescription therapy.  *Opioid medications include: morphine, codeine, oxycodone, oxymorphone, hydrocodone, hydromorphone, meperidine, tramadol, tapentadol, buprenorphine, fentanyl, methadone. **Benzodiazepine medications include: diazepam (Valium), alprazolam (Xanax), clonazepam (Klonopine), lorazepam (Ativan), clorazepate (Tranxene), chlordiazepoxide (Librium), estazolam (Prosom), oxazepam (Serax), temazepam (Restoril), triazolam (Halcion) (Last updated: 12/11/2021) ______________________________________________________________________    ______________________________________________________________________  Medication Recommendations and Reminders  Applies to: All patients receiving prescriptions (written and/or  electronic).  Medication Rules & Regulations: You are responsible for reading, knowing, and following our "Medication Rules" document. These exist for your safety and that of others. They are not flexible and neither are we. Dismissing or ignoring them is an act of "non-compliance" that may result in complete and irreversible termination of such medication therapy. For safety reasons, "non-compliance" will not be tolerated. As with the [place] fundamental legal principle of "ignorance of the law is no defense", we will accept no excuses for not having read and knowing the content of documents provided to you by our practice.  Pharmacy of record:  Definition: This is the pharmacy where your electronic prescriptions will be sent.  We do not endorse any particular pharmacy. It is up to you and your insurance to decide what pharmacy to use.  We do not restrict you in your choice of pharmacy. However, once we write for your prescriptions, we will NOT be re-sending more prescriptions to fix restricted supply problems created by your pharmacy, or your insurance.  The pharmacy listed in the electronic medical record should be the one where you want electronic prescriptions to be sent. If you choose to change pharmacy, simply notify our nursing staff. Changes will be made only during your regular appointments and not over the phone.  Recommendations: Keep all of your pain medications in a safe place, under lock and key, even if you live alone. We will NOT replace lost, stolen, or damaged medication. We do not accept "Police Reports" as proof of medications having been stolen. After you fill your prescription, take 1 week's worth of pills and put them away in a safe place. You should keep a separate, properly labeled bottle for this purpose. The remainder should be kept in the original bottle. Use this as your primary supply, until it runs out. Once it's gone, then you know that you have 1 week's worth of medicine,  and it is time to come in for a prescription refill. If you do this correctly, it is unlikely that you will ever run out of medicine. To make sure that the above recommendation works, it is very important that you make  sure your medication refill appointments are scheduled at least 1 week before you run out of medicine. To do this in an effective manner, make sure that you do not leave the office without scheduling your next medication management appointment. Always ask the nursing staff to show you in your prescription , when your medication will be running out. Then arrange for the receptionist to get you a return appointment, at least 7 days before you run out of medicine. Do not wait until you have 1 or 2 pills left, to come in. This is very poor planning and does not take into consideration that we may need to cancel appointments due to bad weather, sickness, or emergencies affecting our staff. DO NOT ACCEPT A "Partial Fill": If for any reason your pharmacy does not have enough pills/tablets to completely fill or refill your prescription, do not allow for a "partial fill". The law allows the pharmacy to complete that prescription within 72 hours, without requiring a new prescription. If they do not fill the rest of your prescription within those 72 hours, you will need a separate prescription to fill the remaining amount, which we will NOT provide. If the reason for the partial fill is your insurance, you will need to talk to the pharmacist about payment alternatives for the remaining tablets, but again, DO NOT ACCEPT A PARTIAL FILL, unless you can trust your pharmacist to obtain the remainder of the pills within 72 hours.  Prescription refills and/or changes in medication(s):  Prescription refills, and/or changes in dose or medication, will be conducted only during scheduled medication management appointments. (Applies to both, written and electronic prescriptions.) No refills on procedure days. No  medication will be changed or started on procedure days. No changes, adjustments, and/or refills will be conducted on a procedure day. Doing so will interfere with the diagnostic portion of the procedure. No phone refills. No medications will be "called into the pharmacy". No Fax refills. No weekend refills. No Holliday refills. No after hours refills.  Remember:  Business hours are:  Monday to Thursday 8:00 AM to 4:00 PM Provider's Schedule: Milinda Pointer, MD - Appointments are:  Medication management: Monday and Wednesday 8:00 AM to 4:00 PM Procedure day: Tuesday and Thursday 7:30 AM to 4:00 PM Gillis Santa, MD - Appointments are:  Medication management: Tuesday and Thursday 8:00 AM to 4:00 PM Procedure day: Monday and Wednesday 7:30 AM to 4:00 PM (Last update: 12/11/2021) ______________________________________________________________________    ____________________________________________________________________________________________  Drug Holidays  What is a "Drug Holiday"? Drug Holiday: is the name given to the process of slowly tapering down and temporarily stopping the pain medication for the purpose of decreasing or eliminating tolerance to the drug.  Benefits Improved effectiveness Decreased required effective dose Improved pain control End dependence on high dose therapy Decrease cost of therapy Uncovering "opioid-induced hyperalgesia". (OIH)  What is "opioid hyperalgesia"? It is a paradoxical increase in pain caused by exposure to opioids. Stopping the opioid pain medication, contrary to the expected, it actually decreases or completely eliminates the pain. Ref.: "A comprehensive review of opioid-induced hyperalgesia". Brion Aliment, et.al. Pain Physician. 2011 Mar-Apr;14(2):145-61.  What is tolerance? Tolerance: the progressive loss of effectiveness of a pain medicine due to repetitive use. A common problem of opioid pain medications.  How long should a "Drug  Holiday" last? Effectiveness depends on the patient staying off all opioid pain medicines for a minimum of 14 consecutive days. (2 weeks)  How about just taking less of the medicine? Does not  work. Will not accomplish goal of eliminating the excess receptors.  How about switching to a different pain medicine? (AKA. "Opioid rotation") Does not work. Creates the illusion of effectiveness by taking advantage of inaccurate equivalent dose calculations between different opioids. -This "technique" was promoted by studies funded by pharmaceutical companies, such as [employer], creators of "OxyContin".  Can I stop the medicine "cold turkey"? We do not recommend it. You should always coordinate with your prescribing physician to make the transition as smoothly as possible. Avoid stopping the medicine abruptly without consulting. We recommend a "slow taper".  What is a slow taper? Taper: refers to the gradual decrease in dose.   How do I stop/taper the dose? Slowly. Decrease the daily amount of pills that you take by one (1) pill every seven (7) days. This is called a "slow downward taper". Example: if you normally take four (4) pills per day, drop it to three (3) pills per day for seven (7) days, then to two (2) pills per day for seven (7) days, then to one (1) per day for seven (7) days, and then stop the medicine. The 14 day "Drug Holiday" starts on the first day without medicine.   Will I experience withdrawals? Unlikely with a slow taper.  What triggers withdrawals? Withdrawals are triggered by the sudden/abrupt stop of high dose opioids. Withdrawals can be avoided by slowly decreasing the dose over a prolonged period of time.  What are withdrawals? Symptoms associated with sudden/abrupt reduction/stopping of high-dose, long-term use of pain medication. Withdrawal are seldom seen on low dose therapy, or patients rarely taking opioid medication.  Early Withdrawal Symptoms may  include: Agitation Anxiety Muscle aches Increased tearing Insomnia Runny nose Sweating Yawning  Late symptoms may include: Abdominal cramping Diarrhea Dilated pupils Goose bumps Nausea Vomiting  When could I see withdrawals? Onset: 8-24 hours after last use for most opioids. 12-48 hours for long-acting opioids (i.e.: methadone)  How long could they last? Duration: 4-10 days for most opioids. 14-21 days for long-acting opioids (i.e.: methadone)  What will happen after I complete my "Drug Holiday"? The need and indications for the opioid analgesic will be reviewed before restarting the medication. Dose requirements will likely decrease and the dose will need to be adjusted accordingly.   (Last update: 05/08/2022) ____________________________________________________________________________________________    ____________________________________________________________________________________________  WARNING: CBD (cannabidiol) & Delta (Delta-8 tetrahydrocannabinol) products.   Applicable to:  All individuals currently taking or considering taking CBD (cannabidiol) and, more important, all patients taking opioid analgesic controlled substances (pain medication). (Example: oxycodone; oxymorphone; hydrocodone; hydromorphone; morphine; methadone; tramadol; tapentadol; fentanyl; buprenorphine; butorphanol; dextromethorphan; meperidine; codeine; etc.)  Introduction:  Recently there has been a drive towards the use of "natural" products for the treatment of different conditions, including pain anxiety and sleep disorders. Marijuana and hemp are two varieties of the cannabis genus plants. Marijuana and its derivatives are illegal, while hemp and its derivatives are not. Cannabidiol (CBD) and tetrahydrocannabinol (THC), are two natural compounds found in plants of the Cannabis genus. They can both be extracted from hemp or marijuana. Both compounds interact with your body's endocannabinoid  system in very different ways. CBD is associated with pain relief (analgesia) while THC is associated with the psychoactive effects ("the high") obtained from the use of marijuana products. There are two main types of THC: Delta-9, which comes from the marijuana plant and it is illegal, and Delta-8, which comes from the hemp plant, and it is legal. (Both, Delta-9-THC and Delta-8-THC are psychoactive and   give you "the high".)   Legality:  Marijuana and its derivatives: illegal Hemp and its derivatives: Legal (State dependent) UPDATE: (04/06/2021) The Drug Enforcement Agency ([place]) issued a letter stating that "delta" cannabinoids, including Delta-8-THCO and Delta-9-THCO, synthetically derived from hemp do not qualify as hemp and will be viewed as Schedule I drugs. (Schedule I drugs, substances, or chemicals are defined as drugs with no currently accepted medical use and a high potential for abuse. Some examples of Schedule I drugs are: heroin, lysergic acid diethylamide (LSD), marijuana (cannabis), 3,4-methylenedioxymethamphetamine (ecstasy), methaqualone, and peyote.) (https://jennings.com/)  Legal status of CBD in [place]:  "Conditionally Legal"  Reference: "FDA Regulation of Cannabis and Cannabis-Derived Products, Including Cannabidiol (CBD)" - SeekArtists.com.pt  Warning:  CBD is not FDA approved and has not undergo the same manufacturing controls as prescription drugs.  This means that the purity and safety of available CBD may be questionable. Most of the time, despite manufacturer's claims, it is contaminated with THC (delta-9-tetrahydrocannabinol - the chemical in marijuana responsible for the "HIGH").  When this is the case, the [facility] contaminant will trigger a positive urine drug screen (UDS) test for Marijuana (carboxy-THC).   The FDA recently put out a warning about 5 things that everyone  should be aware of regarding Delta-8 THC: Delta-8 THC products have not been evaluated or approved by the FDA for safe use and may be marketed in ways that put the public health at risk. The FDA has received adverse event reports involving delta-8 THC-containing products. Delta-8 THC has psychoactive and intoxicating effects. Delta-8 THC manufacturing often involve use of potentially harmful chemicals to create the concentrations of delta-8 THC claimed in the marketplace. The final delta-8 THC product may have potentially harmful by-products (contaminants) due to the chemicals used in the process. Manufacturing of delta-8 THC products may occur in uncontrolled or unsanitary settings, which may lead to the presence of unsafe contaminants or other potentially harmful substances. Delta-8 THC products should be kept out of the reach of children and pets.  NOTE: Because a positive UDS for any illicit substance is a violation of our medication agreement, your opioid analgesics (pain medicine) may be permanently discontinued.  MORE ABOUT CBD  General Information: CBD was discovered in 21 and it is a derivative of the cannabis sativa genus plants (Marijuana and Hemp). It is one of the 113 identified substances found in Marijuana. It accounts for up to 40% of the plant's extract. As of 2018, preliminary clinical studies on CBD included research for the treatment of anxiety, movement disorders, and pain. CBD is available and consumed in multiple forms, including inhalation of smoke or vapor, as an aerosol spray, and by mouth. It may be supplied as an oil containing CBD, capsules, dried cannabis, or as a liquid solution. CBD is thought not to be as psychoactive as THC (delta-9-tetrahydrocannabinol - the chemical in marijuana responsible for the "HIGH"). Studies suggest that CBD may interact with different biological target receptors in the body, including cannabinoid and other neurotransmitter receptors. As of  2018 the mechanism of action for its biological effects has not been determined.  Side-effects  Adverse reactions: Dry mouth, diarrhea, decreased appetite, fatigue, drowsiness, malaise, weakness, sleep disturbances, and others.  Drug interactions:  CBD may interact with medications such as blood-thinners. CBD causes drowsiness on its own and it will increase drowsiness caused by other medications, including antihistamines (such as Benadryl), benzodiazepines (Xanax, Ativan, Valium), antipsychotics, antidepressants, opioids, alcohol and supplements such as kava, melatonin and St. John's Wort.  Other drug interactions: Brivaracetam (Briviact); Caffeine; Carbamazepine (Tegretol); Citalopram (Celexa); Clobazam (Onfi); Eslicarbazepine (Aptiom); Everolimus (Zostress); Lithium; Methadone (Dolophine); Rufinamide (Banzel); Sedative medications (CNS depressants); Sirolimus (Rapamune); Stiripentol (Diacomit); Tacrolimus (Prograf); Tamoxifen ; Soltamox); Topiramate (Topamax); Valproate; Warfarin (Coumadin); Zonisamide. (Last update: 01/28/2022) ____________________________________________________________________________________________   ____________________________________________________________________________________________  Naloxone Nasal Spray  Why am I receiving this medication? [place] STOP ACT requires that all patients taking high dose opioids or at risk of opioids respiratory depression, be prescribed an opioid reversal agent, such as Naloxone (AKA: Narcan).  What is this medication? NALOXONE (nal OX one) treats opioid overdose, which causes slow or shallow breathing, severe drowsiness, or trouble staying awake. Call emergency services after using this medication. You may need additional treatment. Naloxone works by reversing the effects of opioids. It belongs to a group of medications called opioid blockers.  COMMON BRAND NAME(S): Kloxxado, Narcan  What should I tell my care team before  I take this medication? They need to know if you have any of these conditions: Heart disease Substance use disorder An unusual or allergic reaction to naloxone, other medications, foods, dyes, or preservatives Pregnant or trying to get pregnant Breast-feeding  When to use this medication? This medication is to be used for the treatment of respiratory depression (less than 8 breaths per minute) secondary to opioid overdose.   How to use this medication? This medication is for use in the nose. Lay the person on their back. Support their neck with your hand and allow the head to tilt back before giving the medication. The nasal spray should be given into 1 nostril. After giving the medication, move the person onto their side. Do not remove or test the nasal spray until ready to use. Get emergency medical help right away after giving the first dose of this medication, even if the person wakes up. You should be familiar with how to recognize the signs and symptoms of a narcotic overdose. If more doses are needed, give the additional dose in the other nostril. Talk to your care team about the use of this medication in children. While this medication may be prescribed for children as young as newborns for selected conditions, precautions do apply.  Naloxone Overdosage: If you think you have taken too much of this medicine contact a poison control center or emergency room at once.  NOTE: This medicine is only for you. Do not share this medicine with others.  What if I miss a dose? This does not apply.  What may interact with this medication? This is only used during an emergency. No interactions are expected during emergency use. This list may not describe all possible interactions. Give your health care provider a list of all the medicines, herbs, non-prescription drugs, or dietary supplements you use. Also tell them if you smoke, drink alcohol, or use illegal drugs. Some items may interact with  your medicine.  What should I watch for while using this medication? Keep this medication ready for use in the case of an opioid overdose. Make sure that you have the phone number of your care team and local hospital ready. You may need to have additional doses of this medication. Each nasal spray contains a single dose. Some emergencies may require additional doses. After use, bring the treated person to the nearest hospital or call 911. Make sure the treating care team knows that the person has received a dose of this medication. You will receive additional instructions on what to do during and after use of this  medication before an emergency occurs.  What side effects may I notice from receiving this medication? Side effects that you should report to your care team as soon as possible: Allergic reactions--skin rash, itching, hives, swelling of the face, lips, tongue, or throat Side effects that usually do not require medical attention (report these to your care team if they continue or are bothersome): Constipation Dryness or irritation inside the nose Headache Increase in blood pressure Muscle spasms Stuffy nose Toothache This list may not describe all possible side effects. Call your doctor for medical advice about side effects. You may report side effects to FDA at 1-800-FDA-1088.  Where should I keep my medication? Because this is an emergency medication, you should keep it with you at all times.  Keep out of the reach of children and pets. Store between 20 and 25 degrees C (68 and 77 degrees F). Do not freeze. Throw away any unused medication after the expiration date. Keep in original box until ready to use.  NOTE: This sheet is a summary. It may not cover all possible information. If you have questions about this medicine, talk to your doctor, pharmacist, or health care provider.   2023 Elsevier/Gold Standard (2020-10-13  00:00:00)  ____________________________________________________________________________________________   

## 2022-05-21 NOTE — Progress Notes (Unsigned)
PROVIDER NOTE: Information contained herein reflects review and annotations entered in association with encounter. Interpretation of such information and data should be left to medically-trained personnel. Information provided to patient can be located elsewhere in the medical record under "Patient Instructions". Document created using STT-dictation technology, any transcriptional errors that may result from process are unintentional.    Patient: Kenneth Osborn  Service Category: E/M  Provider: Gaspar Cola, MD  DOB: 1955-08-24  DOS: 05/22/2022  Referring Provider: [name], MD  MRN: [id number]  Specialty: Interventional Pain Management  PCP: [name], MD  Type: Established Patient  Setting: Ambulatory outpatient    Location: Office  Delivery: Face-to-face     HPI  Kenneth Osborn, a 67 y.o. year old male, is here today because of his Chronic pain of left knee [M25.562, G89.29]. Kenneth Osborn primary complain today is No chief complaint on file.  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); Chronic pain syndrome; Headache disorder; Trigger point posterior superior iliac  spine (PSIS) (Right); 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); Polyneuropathy; Type 2 diabetes mellitus with diabetic polyneuropathy, with long-term current use of insulin; Spondylosis without myelopathy or radiculopathy, lumbosacral region; Frequent falls; Diabetic polyneuropathy associated with diabetes mellitus due to underlying condition; Neuropathic pain; Musculoskeletal pain; DDD (degenerative disc disease), lumbosacral; Chronic low back pain (Bilateral) w/o sciatica; Renal mass; 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; Malignant neoplasm of left kidney, except renal pelvis; Chronic low back pain (Right) w/ sciatica (Right); Chronic lower extremity pain (Right); Chronic Lumbar radicular pain (Right: S1); Epidural fibrosis; Acute pain of left knee; and Chronic knee pain (Left) on their pertinent problem list. Pain Assessment: Severity of   is reported as a  /10. Location:    / . Onset:  . Quality:  . Timing:  . Modifying factor(s):  Marland Kitchen Vitals:  vitals were not taken for this visit.  BMI: Estimated body mass index is 32.49 kg/m as calculated from the following:   Height as of 05/06/22: 5\' 9"  (1.753 m).   Weight as of 05/06/22: 220 lb (99.8 kg). Last encounter: 05/06/2022. Last procedure: 12/18/2021.  (Right Racz procedure #1)  Reason for encounter: medication management. ***  The patient was last seen on 05/06/2022 at which time he indicated that he had taken it upon himself to taper his medication down for the purpose of a "drug holiday".  Under 05/06/2022 visit he indicated that for the past 2 weeks he had been taking 2 tablets/day.  He also indicated that on 05/05/2022 he had started taking 1 tablet/day.  He was instructed to continue doing that for 1 week and then to completely stop his medicine for a period of 14 consecutive days for the purpose of the "Drug Holiday".  By my calculations, if he started the drug holiday on 05/13/2022 he should have been done  with it on 05/27/2022.  On 05/06/2022 he had a total pill count of 186 pills (36 of 150 pills from the 03/18/2022 prescription, and 150 of 150 pills from a prescription that he had failed on 04/22/2022).  If out of the 186 pills we assume that he follow my instructions and took 1 pill/day for those additional 7 days, he should have 179 pills left before starting "Drug Holiday".  Assuming that he ended the drug holiday on 05/27/2022 and assuming that he follow my instructions of resuming his regimen that 3 tablets/day on 05/27/2022, day 179 pills should last him for 59 days or until 07/25/2022.  On 05/06/2022 I wrote a prescription for him to get filled on 07/25/2022.  This prescription reflects the regimen that we spoke about of taking 15 mg/day of oxycodone rather than the 25 mg that he was taking before.  His medication management follow-up from that appointment should have been scheduled just before 08/24/2022.  In addition to the above, on that visit we also talked about his left knee pain for which an MRI was ordered.  There is also the MRI indicated the patient to have mild patellofemoral chondrosis along the inferior aspect of the patellar facets, mild edema in the suprapatellar fat pad which can be seen in suprapatellar impingement.  With these findings, we could offer the patient a left suprapatellar intra-articular steroid patellofemoral knee injection.  RTCB: 08/24/2022  Pharmacotherapy Assessment  Analgesic: Oxycodone IR 5 mg, 1 tab PO 5x/day (25 mg/day of oxycodone).  (05/06/2022) "Drug Holiday" process with subsequent change in dosing to 3 times daily. MME/day: 37.5 mg/day.   Monitoring: [place] PMP: PDMP reviewed during this encounter.       Pharmacotherapy: No side-effects or adverse reactions reported. Compliance: No problems identified. Effectiveness: Clinically acceptable.  No notes on file  No results found for: "CBDTHCR" No results found for: "D8THCCBX" No results found for: "D9THCCBX"  UDS:   Summary  Date Value Ref Range Status  06/27/2021 Note  Final    Comment:    ==================================================================== [employer] Select 13 (MW) ==================================================================== Test                             Result       Flag       Units  Drug Present and Declared for Prescription Verification   Desmethyldiazepam              222          EXPECTED   ng/mg creat   Oxazepam                       665          EXPECTED   ng/mg creat   Temazepam                      411          EXPECTED   ng/mg creat    Desmethyldiazepam, oxazepam, and temazepam are expected metabolites    of diazepam. Desmethyldiazepam and oxazepam are also expected    metabolites of other drugs, including chlordiazepoxide, prazepam,    clorazepate, and halazepam. Oxazepam is an expected metabolite of    temazepam. Oxazepam and temazepam are also available as scheduled    prescription medications.    Oxycodone  371          EXPECTED   ng/mg creat   Oxymorphone                    1345         EXPECTED   ng/mg creat   Noroxycodone                   511          EXPECTED   ng/mg creat   Noroxymorphone                 320          EXPECTED   ng/mg creat    Sources of oxycodone are scheduled prescription medications.    Oxymorphone, noroxycodone, and noroxymorphone are expected    metabolites of oxycodone. Oxymorphone is also available as a    scheduled prescription medication.  Drug Absent but Declared for Prescription Verification   Alprazolam                     Not Detected UNEXPECTED ng/mg creat ==================================================================== Test                      Result    Flag   Units      Ref Range   Creatinine              65               mg/dL      >=20 ==================================================================== Declared Medications:  The flagging and interpretation on this report are based on  the  following declared medications.  Unexpected results may arise from  inaccuracies in the declared medications.   **Note: The testing scope of this panel includes these medications:   Alprazolam (Xanax)  Diazepam (Valium)  Oxycodone (Roxicodone)   **Note: The testing scope of this panel does not include the  following reported medications:   Aspirin  Cetirizine (Zyrtec)  Cyclobenzaprine (Flexeril)  Diltiazem (Cardizem)  Docusate (Senokot-S)  Enalapril (Vasotec)  Esomeprazole (Nexium)  Ezetimibe (Zetia)  Gabapentin (Neurontin)  Glucagon  Insulin (Humulin)  Isosorbide (Imdur)  Magnesium (Mag-Ox)  Meclizine (Antivert)  Metformin (Glucophage)  Metoprolol (Toprol)  Montelukast (Singulair)  Naloxegol (Movantik)  Niacin  Nitroglycerin (Nitrostat)  Polyethylene Glycol (MiraLAX)  Prasugrel (Effient)  Rosuvastatin (Crestor)  Sennosides (Senokot-S) ==================================================================== For clinical consultation, please call [phone]. ====================================================================       ROS  Constitutional: Denies any fever or chills Gastrointestinal: No reported hemesis, hematochezia, vomiting, or acute GI distress Musculoskeletal: Denies any acute onset joint swelling, redness, loss of ROM, or weakness Neurological: No reported episodes of acute onset apraxia, aphasia, dysarthria, agnosia, amnesia, paralysis, loss of coordination, or loss of consciousness  Medication Review  ALPRAZolam, Glucagon, Magnesium Oxide, aspirin EC, cetirizine, cyclobenzaprine, diltiazem, esomeprazole, ezetimibe, gabapentin, insulin regular human CONCENTRATED, isosorbide mononitrate, meclizine, metFORMIN, metoprolol succinate, montelukast, naloxegol oxalate, naloxone, niacin, nitroGLYCERIN, oxyCODONE, prasugrel, rosuvastatin, and senna-docusate  History Review  Allergy: Kenneth Osborn has No Known Allergies. Drug: Kenneth Osborn  reports no  history of drug use. Alcohol:  reports no history of alcohol use. Tobacco:  reports that he has never smoked. He has never used smokeless tobacco. Social: Kenneth Osborn  reports that he has never smoked. He has never used smokeless tobacco. He reports that he does not drink alcohol and does not use drugs. Medical:  has a past medical history of Acute postoperative pain (12/03/2018),  Allergic rhinitis (12/30/2012), Anginal pain ([place]), Anxiety, Bronchitis, Can't get food down (08/12/2014), Cancer ([place]) (01/2020), Chronic back pain, Concussion (09/2015), Coronary artery disease, DDD (degenerative disc disease), cervical, Dehydration symptoms, Diabetes mellitus without complication ([place]), Dysphagia, GERD (gastroesophageal reflux disease), History of Meniere's disease (12/21/2014), History of thoracic spine surgery (S/P T9-10 IVD spacer) (12/21/2014), Hypercholesteremia, Hyperlipidemia, Hypertension, Meniere's disease, Myocardial infarction ([place]) (2012), Neuromuscular disorder ([place]), Renal disorder, Severe sepsis ([place]) (02/07/2020), Short-segment Barrett's esophagus, and Sleep apnea. Surgical: Kenneth Osborn  has a past surgical history that includes Cardiovascular stress test; Cardiac catheterization; Labrinthectomy; mastoid shunt (Bilateral); Back surgery; Shoulder arthroscopy with subacromial decompression (Left, 04/06/2012); ARTHRODESIS ANTERIOR ANTERIOR CERVICLE SPINE (01/04/2013); Colonoscopy with propofol (N/A, 09/19/2014); Esophagogastroduodenoscopy (N/A, 09/19/2014); Savory dilation (N/A, 09/19/2014); Spinal cord stimulator implant (Right); Cardiac catheterization (N/A, 06/15/2015); Cardiac catheterization (N/A, 06/15/2015); Pulse generator implant (N/A, 01/18/2019); Lumbar spinal cord simulator lead removal (Right, 08/09/2019); IR Perc Cholecystostomy (02/09/2020); IR Radiologist Eval & Mgmt (03/14/2020); Coronary angioplasty; Appendectomy; Robot assisted laparoscopic nephrectomy (Left, 04/20/2020); and Colonoscopy with  propofol (N/A, 04/03/2021). Family: family history includes Cancer in his sister; Diabetes in his maternal grandmother, mother, and paternal grandmother; Heart disease in his father, maternal aunt, maternal uncle, and mother.  Laboratory Chemistry Profile   Renal Lab Results  Component Value Date   BUN 37 (H) 11/02/2021   CREATININE 2.18 (H) 11/02/2021   GFRAA 58 (L) 01/08/2019   GFRNONAA 32 (L) 11/02/2021    Hepatic Lab Results  Component Value Date   AST 33 11/01/2021   ALT 31 11/01/2021   ALBUMIN 4.0 11/01/2021   ALKPHOS 79 11/01/2021   HCVAB NON REACTIVE 02/08/2020   LIPASE 28 12/11/2020   AMMONIA 24 02/07/2020    Electrolytes Lab Results  Component Value Date   NA 138 11/02/2021   K 5.9 (H) 11/02/2021   CL 107 11/02/2021   CALCIUM 9.8 11/02/2021   MG 1.6 (L) 11/02/2021   PHOS 3.1 11/09/2020    Bone No results found for: "VD25OH", "VD125OH2TOT", "[id number]", "[id number]", "25OHVITD1", "25OHVITD2", "[id number]", "TESTOFREE", "TESTOSTERONE"  Inflammation (CRP: Acute Phase) (ESR: Chronic Phase) Lab Results  Component Value Date   CRP 1.4 (H) 03/22/2015   ESRSEDRATE 9 03/22/2015   LATICACIDVEN 1.5 12/11/2020         Note: Above Lab results reviewed.  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 No intravenous contrast was administered.  COMPARISON:  None Available.  FINDINGS: MENISCI  Medial: Intact.  Lateral: Intact.  LIGAMENTS  Cruciates: ACL and PCL are intact.  Collaterals: Medial collateral ligament is intact. Lateral collateral ligament complex is intact.  CARTILAGE  Patellofemoral: Mild chondrosis along the inferior aspect of the patellar facets.  Medial:  No chondral  defect.  Lateral:  No chondral defect.  JOINT: Trace joint effusion. Mild edema in the suprapatellar fat pad.  POPLITEAL FOSSA: Miniscule Baker's cyst.  EXTENSOR MECHANISM: Intact quadriceps tendon. Intact patellar tendon.  BONES: No evidence of acute fracture.  No aggressive osseous lesion.  Other: No focal fluid collection.  IMPRESSION: Mild patellofemoral chondrosis along the inferior aspect of the patellar facets.  Mild edema in the suprapatellar fat pad, can be seen in suprapatellar impingement.  No evidence of meniscus tear or ligamentous injury.  Electronically Signed   By: Edison Nasuti  Chancy Milroy M.D.   On: 05/20/2022 16:38 Note: Reviewed        Physical Exam  General appearance: Well nourished, well developed, and well hydrated. In no apparent acute distress Mental status: Alert, oriented x 3 (person, place, & time)       Respiratory: No evidence of acute respiratory distress Eyes: PERLA Vitals: There were no vitals taken for this visit. BMI: Estimated body mass index is 32.49 kg/m as calculated from the following:   Height as of 05/06/22: 5\' 9"  (1.753 m).   Weight as of 05/06/22: 220 lb (99.8 kg). Ideal: Patient weight not recorded  Assessment   Diagnosis Status  1. Chronic knee pain (Left)   2. Acute pain of left knee    Controlled Controlled Controlled   Updated Problems: Problem  Hypertension    Plan of Care  Problem-specific:  No problem-specific Assessment & Plan notes found for this encounter.  Kenneth Osborn has a current medication list which includes the following long-term medication(s): cetirizine, cyclobenzaprine, esomeprazole, ezetimibe, gabapentin, humulin r u-500 kwikpen, metoprolol succinate, montelukast, niacin, and [START ON 07/25/2022] oxycodone.  Pharmacotherapy (Medications Ordered): No orders of the defined types were placed in this encounter.  Orders:  No orders of the defined types were placed in this encounter.  Follow-up  plan:   No follow-ups on file.      Interventional Therapies  Risk Factors  Considerations:   EFFIENT Anticoagulation: (Stop: 7-10 days  Re-start: 6 hrs)  Mnire's disease  OSA  GERD  CAD  (B) Carotid Artery Stenosis  s/p CABG  Hx. Cardiac Syncope  T2IDDM  Hx. MI (06/2010)  Unstable Angina   Planned  Pending:   Diagnostic/therapeutic left suprapatellar intra-articular steroid patellofemoral knee injection #1   Under consideration:   Diagnostic/therapeutic left suprapatellar intra-articular steroid patellofemoral knee injection #1 CYP2D6 / CYP3A4 genetic testing.   Completed:   Therapeutic right Racz procedure x1 (12/18/2021) (100/100/70/70-100)  Diagnostic/therapeutic right caudal ESI x1 (11/13/2021)  Palliative right PSIS MNB/TPI x3 (11/13/2017) (100/100/100)  Diagnostic/palliative right lumbar facet MBB x2 (09/22/2018) (100/100/75/75)  Diagnostic/palliative left lumbar facet MBB x2 (09/22/2018) (100/100/75/75)  Palliative left lumbar facet RFA x3 (02/20/2021) (85/85/55/60-70)  Palliative right lumbar facet RFA x3 (01/09/2021) (100/95/50/60-70)    Therapeutic  Palliative (PRN) options:   Palliative right PSIS MNB/TPI  Diagnostic/palliative lumbar facet MBB  Palliative lumbar facet RFA    Pharmacotherapy  Nonopioids transferred 11/22/2019: Magnesium, Flexeril, and Neurontin. Last "Drug Holiday": See 05/06/2022 note.  (To be completed on 05/27/2022)        Recent Visits Date Type Provider Dept  05/06/22 Office Visit Milinda Pointer, MD [facility]  Showing recent visits within past 90 days and meeting all other requirements Future Appointments Date Type Provider Dept  05/22/22 Appointment Milinda Pointer, MD [facility]  Showing future appointments within next 90 days and meeting all other requirements  I discussed the assessment and treatment plan with the patient. The patient was provided an opportunity to ask questions and all were answered.  The patient agreed with the plan and demonstrated an understanding of the instructions.  Patient advised to call back or seek an in-person evaluation if the symptoms or condition worsens.  Duration of encounter: *** minutes.  Total time on encounter, as per AMA guidelines included both the face-to-face and non-face-to-face time personally spent by the physician and/or other qualified health care professional(s) on the day of the encounter (includes time in activities that require the physician or other qualified health  care professional and does not include time in activities normally performed by clinical staff). Physician's time may include the following activities when performed: Preparing to see the patient (e.g., pre-charting review of records, searching for previously ordered imaging, lab work, and nerve conduction tests) Review of prior analgesic pharmacotherapies. Reviewing PMP Interpreting ordered tests (e.g., lab work, imaging, nerve conduction tests) Performing post-procedure evaluations, including interpretation of diagnostic procedures Obtaining and/or reviewing separately obtained history Performing a medically appropriate examination and/or evaluation Counseling and educating the patient/family/caregiver Ordering medications, tests, or procedures Referring and communicating with other health care professionals (when not separately reported) Documenting clinical information in the electronic or other health record Independently interpreting results (not separately reported) and communicating results to the patient/ family/caregiver Care coordination (not separately reported)  Note by: Gaspar Cola, MD Date: 05/22/2022; Time: 1:02 PM

## 2022-05-22 ENCOUNTER — Encounter: Payer: Self-pay | Admitting: Pain Medicine

## 2022-05-22 ENCOUNTER — Ambulatory Visit: Payer: Medicare Other | Attending: Pain Medicine | Admitting: Pain Medicine

## 2022-05-22 VITALS — BP 135/78 | HR 97 | Temp 97.7°F | Ht 69.0 in | Wt 220.0 lb

## 2022-05-22 DIAGNOSIS — Z79891 Long term (current) use of opiate analgesic: Secondary | ICD-10-CM | POA: Insufficient documentation

## 2022-05-22 DIAGNOSIS — G8929 Other chronic pain: Secondary | ICD-10-CM | POA: Insufficient documentation

## 2022-05-22 DIAGNOSIS — M1712 Unilateral primary osteoarthritis, left knee: Secondary | ICD-10-CM | POA: Diagnosis present

## 2022-05-22 DIAGNOSIS — Z79899 Other long term (current) drug therapy: Secondary | ICD-10-CM | POA: Diagnosis present

## 2022-05-22 DIAGNOSIS — M25562 Pain in left knee: Secondary | ICD-10-CM | POA: Diagnosis not present

## 2022-05-22 DIAGNOSIS — Z7901 Long term (current) use of anticoagulants: Secondary | ICD-10-CM | POA: Insufficient documentation

## 2022-05-22 DIAGNOSIS — M7122 Synovial cyst of popliteal space [Baker], left knee: Secondary | ICD-10-CM | POA: Insufficient documentation

## 2022-05-22 NOTE — Progress Notes (Signed)
Nursing Pain Medication Assessment:  Safety precautions to be maintained throughout the outpatient stay will include: orient to surroundings, keep bed in low position, maintain call bell within reach at all times, provide assistance with transfer out of bed and ambulation.  Medication Inspection Compliance: Pill count conducted under aseptic conditions, in front of the patient. Neither the pills nor the bottle was removed from the patient's sight at any time. Once count was completed pills were immediately returned to the patient in their original bottle.  Medication: Oxycodone IR Pill/Patch Count:  36 of 150  pills remain Pill/Patch Appearance: Markings consistent with prescribed medication Bottle Appearance: Standard pharmacy container. Clearly labeled. Filled Date: 1 / 29 / 2024 Last Medication intake:  2 weeks ago   Medication: Oxycodone IR Pill/Patch Count:  150 of 150 pills remain Pill/Patch Appearance: Markings consistent with prescribed medication Bottle Appearance: Standard pharmacy container. Clearly labeled. Filled Date: 3 / 4 / 2024 Last Medication intake:   2 week ago

## 2022-05-26 LAB — TOXASSURE SELECT 13 (MW), URINE

## 2022-06-04 ENCOUNTER — Encounter: Payer: Self-pay | Admitting: Pain Medicine

## 2022-06-04 ENCOUNTER — Ambulatory Visit: Payer: Medicare Other | Attending: Pain Medicine | Admitting: Pain Medicine

## 2022-06-04 VITALS — BP 134/77 | HR 98 | Temp 97.3°F | Resp 18 | Ht 69.0 in | Wt 215.0 lb

## 2022-06-04 DIAGNOSIS — R809 Proteinuria, unspecified: Secondary | ICD-10-CM | POA: Insufficient documentation

## 2022-06-04 DIAGNOSIS — M7122 Synovial cyst of popliteal space [Baker], left knee: Secondary | ICD-10-CM

## 2022-06-04 DIAGNOSIS — G8929 Other chronic pain: Secondary | ICD-10-CM | POA: Insufficient documentation

## 2022-06-04 DIAGNOSIS — Z7901 Long term (current) use of anticoagulants: Secondary | ICD-10-CM

## 2022-06-04 DIAGNOSIS — M25562 Pain in left knee: Secondary | ICD-10-CM | POA: Insufficient documentation

## 2022-06-04 DIAGNOSIS — I129 Hypertensive chronic kidney disease with stage 1 through stage 4 chronic kidney disease, or unspecified chronic kidney disease: Secondary | ICD-10-CM | POA: Insufficient documentation

## 2022-06-04 DIAGNOSIS — M1712 Unilateral primary osteoarthritis, left knee: Secondary | ICD-10-CM

## 2022-06-04 MED ORDER — LIDOCAINE HCL (PF) 2 % IJ SOLN
5.0000 mL | Freq: Once | INTRAMUSCULAR | Status: AC
  Administered 2022-06-04: 5 mL
  Filled 2022-06-04: qty 5

## 2022-06-04 MED ORDER — ROPIVACAINE HCL 2 MG/ML IJ SOLN
3.0000 mL | Freq: Once | INTRAMUSCULAR | Status: AC
  Administered 2022-06-04: 3 mL via INTRA_ARTICULAR
  Filled 2022-06-04: qty 20

## 2022-06-04 MED ORDER — PENTAFLUOROPROP-TETRAFLUOROETH EX AERO
INHALATION_SPRAY | Freq: Once | CUTANEOUS | Status: AC
  Administered 2022-06-04: 1 via TOPICAL

## 2022-06-04 MED ORDER — METHYLPREDNISOLONE ACETATE 80 MG/ML IJ SUSP
80.0000 mg | Freq: Once | INTRAMUSCULAR | Status: AC
  Administered 2022-06-04: 80 mg via INTRA_ARTICULAR
  Filled 2022-06-04: qty 1

## 2022-06-04 NOTE — Progress Notes (Signed)
Safety precautions to be maintained throughout the outpatient stay will include: orient to surroundings, keep bed in low position, maintain call bell within reach at all times, provide assistance with transfer out of bed and ambulation.  

## 2022-06-04 NOTE — Patient Instructions (Signed)

## 2022-06-04 NOTE — Progress Notes (Signed)
PROVIDER NOTE: Interpretation of information contained herein should be left to medically-trained personnel. Specific patient instructions are provided elsewhere under "Patient Instructions" section of medical record. This document was created in part using STT-dictation technology, any transcriptional errors that may result from this process are unintentional.  Patient: Kenneth Osborn Type: Established DOB: 11-Aug-1955 MRN: [id number] PCP: Jerl Mina, MD  Service: Procedure DOS: 06/04/2022 Setting: Ambulatory Location: Ambulatory outpatient facility Delivery: Face-to-face Provider: Oswaldo Done, MD Specialty: Interventional Pain Management Specialty designation: 09 Location: Outpatient facility Ref. Prov.: Jerl Mina, MD       Interventional Therapy   Procedure:           Type: Steroid Intra-articular Knee Injection #1  Laterality: Left (-LT) Level/approach: Superior (Patello-Femoral) Imaging guidance: None required ([id number]) Anesthesia: Local anesthesia (1-2% Lidocaine) Anxiolysis: None                 Sedation: No Sedation                       DOS: 06/04/2022  Performed by: Oswaldo Done, MD  Purpose: Diagnostic/Therapeutic Indications: Knee arthralgia associated to osteoarthritis of the knee 1. Acute pain of left knee   2. Chronic knee pain (Left)   3. Baker's cyst (tiny) (Left)   4. Osteoarthritis of knee (Left)   5. Osteoarthritis of patellofemoral joint (Left)   6. Chronic anticoagulation (Effient)    NAS-11 score:   Pre-procedure: 2 /10   Post-procedure: 2 /10     Pre-Procedure Preparation  Monitoring: As per clinic protocol.  Risk Assessment: Vitals:  [id number] body mass index is 31.75 kg/m as calculated from the following:   Height as of this encounter: 5\' 9"  (1.753 m).   Weight as of this encounter: 215 lb (97.5 kg)., Rate:98 , BP:134/77, Resp:18, Temp:(!) 97.3 F (36.3 C), SpO2:98 %  Allergies: He has No Known Allergies.   Precautions: No additional precautions required  Blood-thinner(s): None at this time  Coagulopathies: Reviewed. None identified.   Active Infection(s): Reviewed. None identified. Kenneth Osborn is afebrile   Location setting: Exam room Position: Sitting w/ knee bent 90 degrees Safety Precautions: Patient was assessed for positional comfort and pressure points before starting the procedure. Prepping solution: DuraPrep (Iodine Povacrylex [0.7% available iodine] and Isopropyl Alcohol, 74% w/w) Prep Area: Entire knee region Approach: percutaneous, just above the tibial plateau, lateral to the infrapatellar tendon. Intended target: Intra-articular knee space Materials: Tray: Block Needle(s): Regular Qty: 1/side Length: 1.5-inch Gauge: 25G (x1) + 22G (x1)  Meds ordered this encounter  Medications   pentafluoroprop-tetrafluoroeth (GEBAUERS) aerosol   lidocaine HCl (PF) (XYLOCAINE) 2 % injection 5 mL   ropivacaine (PF) 2 mg/mL (0.2%) (NAROPIN) injection 3 mL   methylPREDNISolone acetate (DEPO-MEDROL) injection 80 mg    Orders Placed This Encounter  Procedures   KNEE INJECTION    Local Anesthetic & Steroid injection.    Scheduling Instructions:     Side(s): Left Knee     Sedation: None     Timeframe: Today    Order Specific Question:   Where will this procedure be performed?    Answer:   [facility]   Informed Consent Details: Physician/Practitioner Attestation; Transcribe to consent form and obtain patient signature    Note: Always confirm laterality of pain with Kenneth Osborn, before procedure. Transcribe to consent form and obtain patient signature.    Order Specific Question:   Physician/Practitioner attestation of informed consent for procedure/surgical case  Answer:   I, the physician/practitioner, attest that I have discussed with the patient the benefits, risks, side effects, alternatives, likelihood of achieving goals and potential problems during recovery for the procedure  that I have provided informed consent.    Order Specific Question:   Procedure    Answer:   Left-sided intra-articular knee arthrocentesis (aspiration and/or injection)    Order Specific Question:   Physician/Practitioner performing the procedure    Answer:   Bradlee Heitman A. Laban Emperor, MD    Order Specific Question:   Indication/Reason    Answer:   Chronic left-sided knee pain secondary to knee arthropathy/arthralgia   Care order/instruction: Please confirm that the patient has stopped the Effient (Prasugrel) x 10 days prior to procedure or surgery.    Please confirm that the patient has stopped the Effient (Prasugrel) x 10 days prior to procedure or surgery.    Standing Status:   Standing    Number of Occurrences:   1   Provide equipment / supplies at bedside    Procedure tray: "Block Tray" (Disposable  single use) Skin infiltration needle: Regular 1.5-in, 25-G, (x1) Block Needle type: Regular Amount/quantity: 1 Size: Short(1.5-inch) Gauge: (25G x1) + (22G x1)    Standing Status:   Standing    Number of Occurrences:   1    Order Specific Question:   Specify    Answer:   Block Tray   Bleeding precautions    Standing Status:   Standing    Number of Occurrences:   1     Time-out: 1120 I initiated and conducted the "Time-out" before starting the procedure, as per protocol. The patient was asked to participate by confirming the accuracy of the "Time Out" information. Verification of the correct person, site, and procedure were performed and confirmed by me, the nursing staff, and the patient. "Time-out" conducted as per Joint Commission's Universal Protocol (UP.01.01.01). Procedure checklist: Completed  H&P (Pre-op  Assessment)  Kenneth Osborn is a 67 y.o. (year old), male patient, seen today for interventional treatment. He  has a past surgical history that includes Cardiovascular stress test; Cardiac catheterization; Labrinthectomy; mastoid shunt (Bilateral); Back surgery; Shoulder arthroscopy  with subacromial decompression (Left, 04/06/2012); ARTHRODESIS ANTERIOR ANTERIOR CERVICLE SPINE (01/04/2013); Colonoscopy with propofol (N/A, 09/19/2014); Esophagogastroduodenoscopy (N/A, 09/19/2014); Savory dilation (N/A, 09/19/2014); Spinal cord stimulator implant (Right); Cardiac catheterization (N/A, 06/15/2015); Cardiac catheterization (N/A, 06/15/2015); Pulse generator implant (N/A, 01/18/2019); Lumbar spinal cord simulator lead removal (Right, 08/09/2019); IR Perc Cholecystostomy (02/09/2020); IR Radiologist Eval & Mgmt (03/14/2020); Coronary angioplasty; Appendectomy; Robot assisted laparoscopic nephrectomy (Left, 04/20/2020); and Colonoscopy with propofol (N/A, 04/03/2021). Kenneth Osborn has a current medication list which includes the following prescription(s): alprazolam, aspirin ec, cetirizine, cyclobenzaprine, diazepam, diltiazem, esomeprazole, ezetimibe, gabapentin, glucagon, humulin r u-500 kwikpen, isosorbide mononitrate, magnesium oxide, meclizine, metformin, metoprolol succinate, montelukast, naloxone, niacin, nitroglycerin, [START ON 07/25/2022] oxycodone, prasugrel, rosuvastatin, senna-docusate, and movantik. His primarily concern today is the Knee Pain (bilateral)  He has No Known Allergies.   Last encounter: My last encounter with him was on 05/22/2022. 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 [facility] on 01/04/2013); Neurogenic pain; Thoracic facet syndrome (T8-10); Lumbar facet syndrome (Bilateral) (R>L); Cervical facet syndrome (Right); Cervical spondylosis; Lumbar spondylosis; Chronic upper extremity pain (Left); Chronic cervical radicular pain (Left); Chronic upper back pain; History of thoracic spine surgery (S/P  T9-10 IVD spacer); Failed back surgical syndrome; Chronic musculoskeletal pain; Chronic  lower extremity pain (Left); Chronic Lumbar radicular pain (Left: L4); Arthralgia of shoulder; Chronic tension-type headache, not intractable; 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); Chronic pain syndrome; Headache disorder; Trigger point posterior superior iliac spine (PSIS) (Right); 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); Polyneuropathy; Type 2 diabetes mellitus with diabetic polyneuropathy, with long-term current use of insulin; Spondylosis without myelopathy or radiculopathy, lumbosacral region; Frequent falls; Diabetic polyneuropathy associated with diabetes mellitus due to underlying condition; Neuropathic pain; Musculoskeletal pain; DDD (degenerative disc disease), lumbosacral; Chronic low back pain (Bilateral) w/o sciatica; Renal mass; 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; Malignant neoplasm of left kidney, except renal pelvis; Chronic low back pain (Right) w/ sciatica (Right); Chronic lower extremity pain (Right); Chronic Lumbar radicular pain (Right: S1); Epidural fibrosis; Acute pain of left knee; Chronic knee pain (Left); Osteoarthritis of knee (Left); Osteoarthritis of patellofemoral joint (Left); and Baker's cyst (tiny) (Left) on their pertinent problem list. Pain Assessment: Severity of Chronic pain is reported as a 2 /10. Location: Knee Right, Left/denies. Onset: More than a month ago. Quality: Aching, Sharp. Timing: Constant. Modifying factor(s): rest. Vitals:  height is 5\' 9"  (1.753 m) and weight is 215 lb (97.5 kg). His temperature is 97.3 F (36.3 C) (abnormal). His blood pressure is 134/77 and his pulse is 98. His respiration is 18 and oxygen saturation is 98%.   Reason for encounter: "interventional pain management therapy due pain of at least four (4) weeks in duration, with failure to respond and/or inability to tolerate more conservative care.  Site Confirmation: Kenneth Osborn was asked to confirm the procedure and  laterality before marking the site.  Consent: Before the procedure and under the influence of no sedative(s), amnesic(s), or anxiolytics, the patient was informed of the treatment options, risks and possible complications. To fulfill our ethical and legal obligations, as recommended by the American Medical Association's Code of Ethics, I have informed the patient of my clinical impression; the nature and purpose of the treatment or procedure; the risks, benefits, and possible complications of the intervention; the alternatives, including doing nothing; the risk(s) and benefit(s) of the alternative treatment(s) or procedure(s); and the risk(s) and benefit(s) of doing nothing. The patient was provided information about the general risks and possible complications associated with the procedure. These may include, but are not limited to: failure to achieve desired goals, infection, bleeding, organ or nerve damage, allergic reactions, paralysis, and death. In addition, the patient was informed of those risks and complications associated to Spine-related procedures, such as failure to decrease pain; infection (i.e.: Meningitis, epidural or intraspinal abscess); bleeding (i.e.: epidural hematoma, subarachnoid hemorrhage, or any other type of intraspinal or peri-dural bleeding); organ or nerve damage (i.e.: Any type of peripheral nerve, nerve root, or spinal cord injury) with subsequent damage to sensory, motor, and/or autonomic systems, resulting in permanent pain, numbness, and/or weakness of one or several areas of the body; allergic reactions; (i.e.: anaphylactic reaction); and/or death. Furthermore, the patient was informed of those risks and complications associated with the medications. These include, but are not limited to: allergic reactions (i.e.: anaphylactic or anaphylactoid reaction(s)); adrenal axis suppression; blood sugar elevation that in diabetics may result in ketoacidosis or comma; water retention  that in patients with history of congestive heart failure may result in shortness of breath, pulmonary edema, and decompensation with resultant heart failure; weight gain; swelling or edema; medication-induced neural toxicity; particulate matter embolism and blood  vessel occlusion with resultant organ, and/or nervous system infarction; and/or aseptic necrosis of one or more joints. Finally, the patient was informed that Medicine is not an exact science; therefore, there is also the possibility of unforeseen or unpredictable risks and/or possible complications that may result in a catastrophic outcome. The patient indicated having understood very clearly. We have given the patient no guarantees and we have made no promises. Enough time was given to the patient to ask questions, all of which were answered to the patient's satisfaction. Kenneth Osborn has indicated that he wanted to continue with the procedure. Attestation: I, the ordering provider, attest that I have discussed with the patient the benefits, risks, side-effects, alternatives, likelihood of achieving goals, and potential problems during recovery for the procedure that I have provided informed consent.  Date  Time: 06/04/2022 10:55 AM  Description of procedure  Start Time: 1121 hrs  Local Anesthesia: Once the patient was positioned, prepped, and time-out was completed. The target area was identified located. The skin was marked with an approved surgical skin marker. Once marked, the skin (epidermis, dermis, and hypodermis), and deeper tissues (fat, connective tissue and muscle) were infiltrated with a small amount of a short-acting local anesthetic, loaded on a 10cc syringe with a 25G, 1.5-in  Needle. An appropriate amount of time was allowed for local anesthetics to take effect before proceeding to the next step. Local Anesthetic: Lidocaine 1-2% The unused portion of the local anesthetic was discarded in the proper designated containers. Safety  Precautions: Aspiration looking for blood return was conducted prior to all injections. At no point did I inject any substances, as a needle was being advanced. Before injecting, the patient was told to immediately notify me if he was experiencing any new onset of "ringing in the ears, or metallic taste in the mouth". No attempts were made at seeking any paresthesias. Safe injection practices and needle disposal techniques used. Medications properly checked for expiration dates. SDV (single dose vial) medications used. After the completion of the procedure, all disposable equipment used was discarded in the proper designated medical waste containers.  Technical description: Protocol guidelines were followed. After positioning, the target area was identified and prepped in the usual manner. Skin & deeper tissues infiltrated with local anesthetic. Appropriate amount of time allowed to pass for local anesthetics to take effect. Proper needle placement secured. Once satisfactory needle placement was confirmed, I proceeded to inject the desired solution in slow, incremental fashion, intermittently assessing for discomfort or any signs of abnormal or undesired spread of substance. Once completed, the needle was removed and disposed of, as per hospital protocols. The area was cleaned, making sure to leave some of the prepping solution back to take advantage of its long term bactericidal properties.  Aspiration:  Negative        Vitals:   06/04/22 1054  BP: 134/77  Pulse: 98  Resp: 18  Temp: (!) 97.3 F (36.3 C)  SpO2: 98%  Weight: 215 lb (97.5 kg)  Height: 5\' 9"  (1.753 m)    End Time: 1122 hrs  Imaging guidance  Imaging-assisted Technique: None required. Indication(s): N/A Exposure Time: N/A Contrast: None Fluoroscopic Guidance: N/A Ultrasound Guidance: N/A Interpretation: N/A  Post-op assessment  Post-procedure Vital Signs:  Pulse/HCG Rate: 98  Temp: (!) 97.3 F (36.3 C) Resp:  18 BP: 134/77 SpO2: 98 %  EBL: None  Complications: No immediate post-treatment complications observed by team, or reported by patient.  Note: The patient tolerated the entire procedure well.  A repeat set of vitals were taken after the procedure and the patient was kept under observation following institutional policy, for this type of procedure. Post-procedural neurological assessment was performed, showing return to baseline, prior to discharge. The patient was provided with post-procedure discharge instructions, including a section on how to identify potential problems. Should any problems arise concerning this procedure, the patient was given instructions to immediately contact us, at any time, without hesitation. In any case, we plan to contact the patient by telephone for a follow-up status report regarding this interventional procedure.  Comments:  No additional relevant information.  Plan of care  Chronic Opioid Analgesic:  Oxycodone IR 5 mg, 1 tab PO 5x/day (25 mg/day of oxycodone).  (05/06/2022) "Drug Holiday" process with subsequent change in dosing to 3 times daily. MME/day: 37.5 mg/day.   Medications administered: We administered pentafluoroprop-tetrafluoroeth, lidocaine HCl (PF), ropivacaine (PF) 2 mg/mL (0.2%), and methylPREDNISolone acetate.  Follow-up plan:   Return in about 2 weeks (around 06/18/2022) for Proc-day (T,Th), (Face2F), (PPE).      Interventional Therapies  Risk Factors  Considerations:   EFFIENT Anticoagulation: (Stop: 7-10 days  Re-start: 6 hrs)  Mnire's disease  OSA  GERD  CAD  (B) Carotid Artery Stenosis  s/p CABG  Hx. Cardiac Syncope  T2IDDM  Hx. MI (06/2010)  Unstable Angina   Planned  Pending:   Diagnostic/therapeutic left suprapatellar intra-articular steroid patellofemoral knee injection #1   Under consideration:   Diagnostic/therapeutic left suprapatellar intra-articular steroid patellofemoral knee injection #1 CYP2D6 / CYP3A4 genetic  testing.   Completed:   Therapeutic right Racz procedure x1 (12/18/2021) (100/100/70/70-100)  Diagnostic/therapeutic right caudal ESI x1 (11/13/2021)  Palliative right PSIS MNB/TPI x3 (11/13/2017) (100/100/100)  Diagnostic/palliative right lumbar facet MBB x2 (09/22/2018) (100/100/75/75)  Diagnostic/palliative left lumbar facet MBB x2 (09/22/2018) (100/100/75/75)  Palliative left lumbar facet RFA x3 (02/20/2021) (85/85/55/60-70)  Palliative right lumbar facet RFA x3 (01/09/2021) (100/95/50/60-70)    Therapeutic  Palliative (PRN) options:   Palliative right PSIS MNB/TPI  Diagnostic/palliative lumbar facet MBB  Palliative lumbar facet RFA    Pharmacotherapy  Nonopioids transferred 11/22/2019: Magnesium, Flexeril, and Neurontin. Last "Drug Holiday": See 05/06/2022 note.  (To be completed on 05/27/2022)        Recent Visits Date Type Provider Dept  05/22/22 Office Visit Delano Metz, MD [facility]  05/06/22 Office Visit Delano Metz, MD [facility]  Showing recent visits within past 90 days and meeting all other requirements Today's Visits Date Type Provider Dept  06/04/22 Procedure visit Delano Metz, MD [facility]  Showing today's visits and meeting all other requirements Future Appointments Date Type Provider Dept  06/27/22 Appointment Delano Metz, MD [facility]  08/21/22 Appointment Delano Metz, MD [facility]  Showing future appointments within next 90 days and meeting all other requirements   Disposition: Discharge home  Discharge (Date  Time): 06/04/2022; 1125 hrs.   Primary Care Physician: Jerl Mina, MD Location: [facility] Note by: Oswaldo Done, MD Date: 06/04/2022; Time: 11:51 AM  DISCLAIMER: Medicine is not an Visual merchandiser. It has no guarantees or warranties. The decision to proceed with this intervention was based on the information collected from  the patient. Conclusions were drawn from the patient's questionnaire, interview, and examination. Because information was provided in large part by the patient, it cannot be guaranteed that it has not been purposely or unconsciously manipulated or altered. Every effort has been made to obtain as much accurate, relevant, available  data as possible. Always take into account that the treatment will also be dependent on availability of resources and existing treatment guidelines, considered by other Pain Management Specialists as being common knowledge and practice, at the time of the intervention. It is also important to point out that variation in procedural techniques and pharmacological choices are the acceptable norm. For Medico-Legal review purposes, the indications, contraindications, technique, and results of the these procedures should only be evaluated, judged and interpreted by a Board-Certified Interventional Pain Specialist with extensive familiarity and expertise in the same exact procedure and technique.

## 2022-06-05 ENCOUNTER — Telehealth: Payer: Self-pay | Admitting: *Deleted

## 2022-06-05 NOTE — Telephone Encounter (Signed)
No problems post procedure. 

## 2022-06-06 ENCOUNTER — Ambulatory Visit: Payer: Medicare Other | Admitting: Pain Medicine

## 2022-06-27 ENCOUNTER — Ambulatory Visit: Payer: Medicare Other | Attending: Pain Medicine | Admitting: Pain Medicine

## 2022-06-27 ENCOUNTER — Encounter: Payer: Self-pay | Admitting: Pain Medicine

## 2022-06-27 VITALS — BP 133/92 | HR 118 | Temp 97.3°F | Resp 16 | Ht 69.0 in | Wt 208.0 lb

## 2022-06-27 DIAGNOSIS — G8929 Other chronic pain: Secondary | ICD-10-CM | POA: Diagnosis present

## 2022-06-27 DIAGNOSIS — M1712 Unilateral primary osteoarthritis, left knee: Secondary | ICD-10-CM | POA: Diagnosis present

## 2022-06-27 DIAGNOSIS — M25562 Pain in left knee: Secondary | ICD-10-CM

## 2022-06-27 DIAGNOSIS — Z09 Encounter for follow-up examination after completed treatment for conditions other than malignant neoplasm: Secondary | ICD-10-CM

## 2022-06-27 DIAGNOSIS — M7122 Synovial cyst of popliteal space [Baker], left knee: Secondary | ICD-10-CM

## 2022-06-27 NOTE — Patient Instructions (Signed)

## 2022-06-27 NOTE — Progress Notes (Signed)
PROVIDER NOTE: Information contained herein reflects review and annotations entered in association with encounter. Interpretation of such information and data should be left to medically-trained personnel. Information provided to patient can be located elsewhere in the medical record under "Patient Instructions". Document created using STT-dictation technology, any transcriptional errors that may result from process are unintentional.    Patient: Kenneth Osborn  Service Category: E/M  Provider: Oswaldo Done, MD  DOB: [birth year]  DOS: 06/27/2022  Referring Provider: Jerl Mina, MD  MRN: [id number]  Specialty: Interventional Pain Management  PCP: Kenneth Mina, MD  Type: Established Patient  Setting: Ambulatory outpatient    Location: Office  Delivery: Face-to-face     HPI  [name], a 67 y.o. year old male, is here today because of his Chronic pain of left knee [M25.562, G89.29]. 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 Carolyn Osborn at [facility] on 01/04/2013); Neurogenic pain; Thoracic facet syndrome (T8-10); Lumbar facet syndrome (Bilateral) (R>L); Cervical facet syndrome (Right); Cervical spondylosis; Lumbar spondylosis; Chronic upper extremity pain (Left); Chronic cervical radicular pain (Left); Chronic upper back pain; History of thoracic spine surgery (S/P T9-10 IVD spacer); Failed back surgical syndrome; Chronic musculoskeletal pain; Chronic lower extremity pain (Left); Chronic Lumbar radicular pain (Left: L4); Arthralgia of shoulder; Chronic tension-type headache, not intractable; 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); Chronic pain syndrome; Headache disorder; Trigger point posterior  superior iliac spine (PSIS) (Right); 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); Polyneuropathy; Type 2 diabetes mellitus with diabetic polyneuropathy, with long-term current use of insulin (HCC); Spondylosis without myelopathy or radiculopathy, lumbosacral region; Frequent falls; Diabetic polyneuropathy associated with diabetes mellitus due to underlying condition (HCC); Neuropathic pain; Musculoskeletal pain; DDD (degenerative disc disease), lumbosacral; Chronic low back pain (Bilateral) w/o sciatica; Renal mass; 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; Malignant neoplasm of left kidney, except renal pelvis (HCC); Chronic low back pain (Right) w/ sciatica (Right); Chronic lower extremity pain (Right); Chronic Lumbar radicular pain (Right: S1); Epidural fibrosis; Acute pain of left knee; Chronic knee pain (Left); Osteoarthritis of knee (Left); Osteoarthritis of patellofemoral joint (Left); and Baker's cyst (tiny) (Left) on their pertinent problem list. Pain Assessment: Severity of Chronic pain is reported as a 3 /10. Location: Back Lower/both upper leg. Onset: More than a month ago. Quality: Pressure, Dull. Timing: Constant. Modifying factor(s): lying down. Vitals:  height is 5\' 9"  (1.753 m) and weight is 208 lb (94.3 kg). His temporal temperature is 97.3 F (36.3 C) (abnormal). His blood pressure is 133/92 (abnormal) and his pulse is 118 (abnormal). His respiration is 16 and oxygen saturation is 97%.  BMI: Estimated body mass index is 30.72 kg/m as calculated from the following:   Height as of this encounter: 5\' 9"  (1.753 m).   Weight as of this encounter: 208 lb (94.3 kg). Last encounter: 05/22/2022. Last procedure: 06/04/2022.  Reason for encounter: post-procedure evaluation and assessment.  The patient indicates having attained 100% relief of the pain for the duration of the local anesthetic followed by an ongoing 50% improvement.  Post-procedure evaluation    Type: Steroid Intra-articular Knee Injection #1  Laterality: Left (-LT) Level/approach: Superior (Patello-Femoral) Imaging guidance: None required ([id number]) Anesthesia: Local anesthesia (1-2% Lidocaine) Anxiolysis: None  Sedation: No Sedation                       DOS: 06/04/2022  Performed by: Oswaldo Done, MD  Purpose: Diagnostic/Therapeutic Indications: Knee arthralgia associated to osteoarthritis of the knee 1. Acute pain of left knee   2. Chronic knee pain (Left)   3. Baker's cyst (tiny) (Left)   4. Osteoarthritis of knee (Left)   5. Osteoarthritis of patellofemoral joint (Left)   6. Chronic anticoagulation (Effient)    NAS-11 score:   Pre-procedure: 2 /10   Post-procedure: 2 /10     Effectiveness:  Initial hour after procedure: 100 %. Subsequent 4-6 hours post-procedure: 75 %. Analgesia past initial 6 hours: 50 %. Ongoing improvement:  Analgesic: The patient indicates having an ongoing 50% improvement of his knee pain from the steroid injection. Function: Kenneth Osborn reports improvement in function ROM: Kenneth Osborn reports improvement in ROM  Pharmacotherapy Assessment  Analgesic: Oxycodone IR 5 mg, 1 tab PO 5x/day (25 mg/day of oxycodone).  (05/06/2022) "Drug Holiday" process with subsequent change in dosing to 3 times daily. MME/day: 37.5 mg/day.   Monitoring: [place] PMP: PDMP reviewed during this encounter.       Pharmacotherapy: No side-effects or adverse reactions reported. Compliance: No problems identified. Effectiveness: Clinically acceptable.  No notes on file  No results found for: "CBDTHCR" No results found for: "D8THCCBX" No results found for: "D9THCCBX"  UDS:  Summary  Date Value Ref Range Status  05/22/2022 Note  Final    Comment:    ==================================================================== [employer] Select 13 (MW) ==================================================================== Test                              Result       Flag       Units  Drug Present not Declared for Prescription Verification   Desmethyldiazepam              140          UNEXPECTED ng/mg creat   Oxazepam                       559          UNEXPECTED ng/mg creat   Temazepam                      396          UNEXPECTED ng/mg creat    Desmethyldiazepam, oxazepam, and temazepam are benzodiazepine drugs,    but may also be present as common metabolites of other    benzodiazepine drugs, including diazepam.  Drug Absent but Declared for Prescription Verification   Alprazolam                     Not Detected UNEXPECTED ng/mg creat   Oxycodone                      Not Detected UNEXPECTED ng/mg creat ==================================================================== Test                      Result    Flag   Units      Ref Range   Creatinine              92               mg/dL      >=  20 ==================================================================== Declared Medications:  The flagging and interpretation on this report are based on the  following declared medications.  Unexpected results may arise from  inaccuracies in the declared medications.   **Note: The testing scope of this panel includes these medications:   Alprazolam (Xanax)  Oxycodone (OxyIR)   **Note: The testing scope of this panel does not include the  following reported medications:   Aspirin  Cetirizine (Zyrtec)  Cyclobenzaprine (Flexeril)  Diltiazem (Cardizem)  Docusate (Senokot-S)  Esomeprazole (Nexium)  Ezetimibe (Zetia)  Gabapentin (Neurontin)  Glucagon  Insulin (Humulin)  Isosorbide (Imdur)  Magnesium (Mag-Ox)  Meclizine (Antivert)  Metformin (Glucophage)  Metoprolol (Toprol)  Montelukast (Singulair)  Naloxegol (Movantik)  Naloxone (Narcan)  Nitroglycerin (Nitrostat)  Prasugrel (Effient)  Rosuvastatin (Crestor)  Sennosides (Senokot-S)  Vitamin B3 ==================================================================== For clinical  consultation, please call [phone]. ====================================================================       ROS  Constitutional: Denies any fever or chills Gastrointestinal: No reported hemesis, hematochezia, vomiting, or acute GI distress Musculoskeletal: Denies any acute onset joint swelling, redness, loss of ROM, or weakness Neurological: No reported episodes of acute onset apraxia, aphasia, dysarthria, agnosia, amnesia, paralysis, loss of coordination, or loss of consciousness  Medication Review  ALPRAZolam, Glucagon, Magnesium Oxide, aspirin EC, cetirizine, cyclobenzaprine, diazepam, diltiazem, esomeprazole, ezetimibe, gabapentin, insulin regular human CONCENTRATED, isosorbide mononitrate, meclizine, metFORMIN, metoprolol succinate, montelukast, naloxegol oxalate, naloxone, niacin, nitroGLYCERIN, oxyCODONE, prasugrel, rosuvastatin, and senna-docusate  History Review  Allergy: Mr. Colvard has No Known Allergies. Drug: Mr. Bierley  reports no history of drug use. Alcohol:  reports no history of alcohol use. Tobacco:  reports that he has never smoked. He has never used smokeless tobacco. Social: Mr. Harmelink  reports that he has never smoked. He has never used smokeless tobacco. He reports that he does not drink alcohol and does not use drugs. Medical:  has a past medical history of Acute postoperative pain (12/03/2018), Allergic rhinitis (12/30/2012), Anginal pain (HCC), Anxiety, Bronchitis, Can't get food down (08/12/2014), Cancer (HCC) (01/2020), Chronic back pain, Concussion (09/2015), Coronary artery disease, DDD (degenerative disc disease), cervical, Dehydration symptoms, Diabetes mellitus without complication (HCC), Dysphagia, GERD (gastroesophageal reflux disease), History of Meniere's disease (12/21/2014), History of thoracic spine surgery (S/P T9-10 IVD spacer) (12/21/2014), Hypercholesteremia, Hyperlipidemia, Hypertension, Meniere's disease, Myocardial infarction (HCC) (2012),  Neuromuscular disorder (HCC), Renal disorder, Severe sepsis (HCC) (02/07/2020), Short-segment Barrett's esophagus, and Sleep apnea. Surgical: Mr. Marinelarena  has a past surgical history that includes Cardiovascular stress test; Cardiac catheterization; Labrinthectomy; mastoid shunt (Bilateral); Back surgery; Shoulder arthroscopy with subacromial decompression (Left, 04/06/2012); ARTHRODESIS ANTERIOR ANTERIOR CERVICLE SPINE (01/04/2013); Colonoscopy with propofol (N/A, 09/19/2014); Esophagogastroduodenoscopy (N/A, 09/19/2014); Savory dilation (N/A, 09/19/2014); Spinal cord stimulator implant (Right); Cardiac catheterization (N/A, 06/15/2015); Cardiac catheterization (N/A, 06/15/2015); Pulse generator implant (N/A, 01/18/2019); Lumbar spinal cord simulator lead removal (Right, 08/09/2019); IR Perc Cholecystostomy (02/09/2020); IR Radiologist Eval & Mgmt (03/14/2020); Coronary angioplasty; Appendectomy; Robot assisted laparoscopic nephrectomy (Left, 04/20/2020); and Colonoscopy with propofol (N/A, 04/03/2021). Family: family history includes Cancer in his sister; Diabetes in his maternal grandmother, mother, and paternal grandmother; Heart disease in his father, maternal aunt, maternal uncle, and mother.  Laboratory Chemistry Profile   Renal Lab Results  Component Value Date   BUN 37 (H) 11/02/2021   CREATININE 2.18 (H) 11/02/2021   GFRAA 58 (L) 01/08/2019   GFRNONAA 32 (L) 11/02/2021    Hepatic Lab Results  Component Value Date   AST 33 11/01/2021   ALT 31 11/01/2021   ALBUMIN 4.0 11/01/2021   ALKPHOS 79 11/01/2021  HCVAB NON REACTIVE 02/08/2020   LIPASE 28 12/11/2020   AMMONIA 24 02/07/2020    Electrolytes Lab Results  Component Value Date   NA 138 11/02/2021   K 5.9 (H) 11/02/2021   CL 107 11/02/2021   CALCIUM 9.8 11/02/2021   MG 1.6 (L) 11/02/2021   PHOS 3.1 11/09/2020    Bone No results found for: "VD25OH", "VD125OH2TOT", "ZO1096EA5", "WU9811BJ4", "25OHVITD1", "25OHVITD2", "25OHVITD3", "TESTOFREE",  "TESTOSTERONE"  Inflammation (CRP: Acute Phase) (ESR: Chronic Phase) Lab Results  Component Value Date   CRP 1.4 (H) 03/22/2015   ESRSEDRATE 9 03/22/2015   LATICACIDVEN 1.5 12/11/2020         Note: Above Lab results reviewed.  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 No intravenous contrast was administered.  COMPARISON:  None Available.  FINDINGS: MENISCI  Medial: Intact.  Lateral: Intact.  LIGAMENTS  Cruciates: ACL and PCL are intact.  Collaterals: Medial collateral ligament is intact. Lateral collateral ligament complex is intact.  CARTILAGE  Patellofemoral: Mild chondrosis along the inferior aspect of the patellar facets.  Medial:  No chondral defect.  Lateral:  No chondral defect.  JOINT: Trace joint effusion. Mild edema in the suprapatellar fat pad.  POPLITEAL FOSSA: Miniscule Baker's cyst.  EXTENSOR MECHANISM: Intact quadriceps tendon. Intact patellar tendon.  BONES: No evidence of acute fracture.  No aggressive osseous lesion.  Other: No focal fluid collection.  IMPRESSION: Mild patellofemoral chondrosis along the inferior aspect of the patellar facets.  Mild edema in the suprapatellar fat pad, can be seen in suprapatellar impingement.  No evidence of meniscus tear or ligamentous injury.  Electronically Signed   By: Caprice Renshaw M.D.   On: 05/20/2022 16:38 Note: Reviewed        Physical Exam  General appearance: Well nourished, well developed, and well hydrated. In no apparent acute distress Mental status: Alert, oriented x 3 (person, place, & time)       Respiratory: No evidence of acute respiratory distress Eyes: PERLA Vitals: BP (!) 133/92   Pulse (!) 118    Temp (!) 97.3 F (36.3 C) (Temporal)   Resp 16   Ht 5\' 9"  (1.753 m)   Wt 208 lb (94.3 kg)   SpO2 97%   BMI 30.72 kg/m  BMI: Estimated body mass index is 30.72 kg/m as calculated from the following:   Height as of this encounter: 5\' 9"  (1.753 m).   Weight as of this encounter: 208 lb (94.3 kg). Ideal: Ideal body weight: 70.7 kg (155 lb 13.8 oz) Adjusted ideal body weight: 80.2 kg (176 lb 11.5 oz)  Assessment   Diagnosis Status  1. Chronic knee pain (Left)   2. Acute pain of left knee   3. Baker's cyst (tiny) (Left)   4. Osteoarthritis of patellofemoral joint (Left)   5. Osteoarthritis of knee (Left)   6. Postop check    Controlled Controlled Controlled   Updated Problems: No problems updated.  Plan of Care  Problem-specific:  No problem-specific Assessment & Plan notes found for this encounter.  Mr. KAIQUE CARTWRIGHT has a current medication list which includes the following long-term medication(s): cetirizine, cyclobenzaprine, esomeprazole, ezetimibe, gabapentin, humulin r u-500 kwikpen, metoprolol succinate, montelukast, niacin, and [START ON 07/25/2022] oxycodone.  Pharmacotherapy (Medications Ordered): No  orders of the defined types were placed in this encounter.  Orders:  Orders Placed This Encounter  Procedures   Nursing Instructions:    Please complete this patient's postprocedure evaluation.    Scheduling Instructions:     Please complete this patient's postprocedure evaluation.   Follow-up plan:   No follow-ups on file.      Interventional Therapies  Risk Factors  Considerations:   EFFIENT Anticoagulation: (Stop: 7-10 days  Re-start: 6 hrs)  Mnire's disease  OSA  GERD  CAD  (B) Carotid Artery Stenosis  s/p CABG  Hx. Cardiac Syncope  T2IDDM  Hx. MI (06/2010)  Unstable Angina   Planned  Pending:   Diagnostic/therapeutic left suprapatellar intra-articular steroid patellofemoral knee injection #1   Under consideration:   Diagnostic/therapeutic  left suprapatellar intra-articular steroid patellofemoral knee injection #1 CYP2D6 / CYP3A4 genetic testing.   Completed:   Therapeutic right Racz procedure x1 (12/18/2021) (100/100/70/70-100)  Diagnostic/therapeutic right caudal ESI x1 (11/13/2021)  Palliative right PSIS MNB/TPI x3 (11/13/2017) (100/100/100)  Diagnostic/palliative right lumbar facet MBB x2 (09/22/2018) (100/100/75/75)  Diagnostic/palliative left lumbar facet MBB x2 (09/22/2018) (100/100/75/75)  Palliative left lumbar facet RFA x3 (02/20/2021) (85/85/55/60-70)  Palliative right lumbar facet RFA x3 (01/09/2021) (100/95/50/60-70)    Therapeutic  Palliative (PRN) options:   Palliative right PSIS MNB/TPI  Diagnostic/palliative lumbar facet MBB  Palliative lumbar facet RFA    Pharmacotherapy  Nonopioids transferred 11/22/2019: Magnesium, Flexeril, and Neurontin. Last "Drug Holiday": See 05/06/2022 note.  (To be completed on 05/27/2022)          Recent Visits Date Type Provider Dept  06/27/22 Office Visit Delano Metz, MD [facility]  06/04/22 Procedure visit Delano Metz, MD [facility]  05/22/22 Office Visit Delano Metz, MD [facility]  05/06/22 Office Visit Delano Metz, MD [facility]  Showing recent visits within past 90 days and meeting all other requirements Future Appointments Date Type Provider Dept  08/21/22 Appointment Delano Metz, MD [facility]  Showing future appointments within next 90 days and meeting all other requirements  I discussed the assessment and treatment plan with the patient. The patient was provided an opportunity to ask questions and all were answered. The patient agreed with the plan and demonstrated an understanding of the instructions.  Patient advised to call back or seek an in-person evaluation if the symptoms or condition worsens.  Duration of encounter: 30 minutes.  Total time on encounter, as per AMA  guidelines included both the face-to-face and non-face-to-face time personally spent by the physician and/or other qualified health care professional(s) on the day of the encounter (includes time in activities that require the physician or other qualified health care professional and does not include time in activities normally performed by clinical staff). Physician's time may include the following activities when performed: Preparing to see the patient (e.g., pre-charting review of records, searching for previously ordered imaging, lab work, and nerve conduction tests) Review of prior analgesic pharmacotherapies. Reviewing PMP Interpreting ordered tests (e.g., lab work, imaging, nerve conduction tests) Performing post-procedure evaluations, including interpretation of diagnostic procedures Obtaining and/or reviewing separately obtained history Performing a medically appropriate examination and/or evaluation Counseling and educating the patient/family/caregiver Ordering medications, tests, or procedures Referring and communicating with other health care professionals (when not separately reported) Documenting clinical information in the electronic or other health record Independently interpreting results (not separately reported) and communicating results to the patient/ family/caregiver Care coordination (not separately reported)  Note by: Oswaldo Done, MD Date:  06/27/2022; Time: 6:49 AM

## 2022-08-03 IMAGING — DX DG CHEST 1V PORT
1 series · 1 of 1 positions shown · non-contrast
Comparison: 02/13/2020

CLINICAL DATA: Weakness, hypoxia

EXAM:
PORTABLE CHEST 1 VIEW

[chest ap]
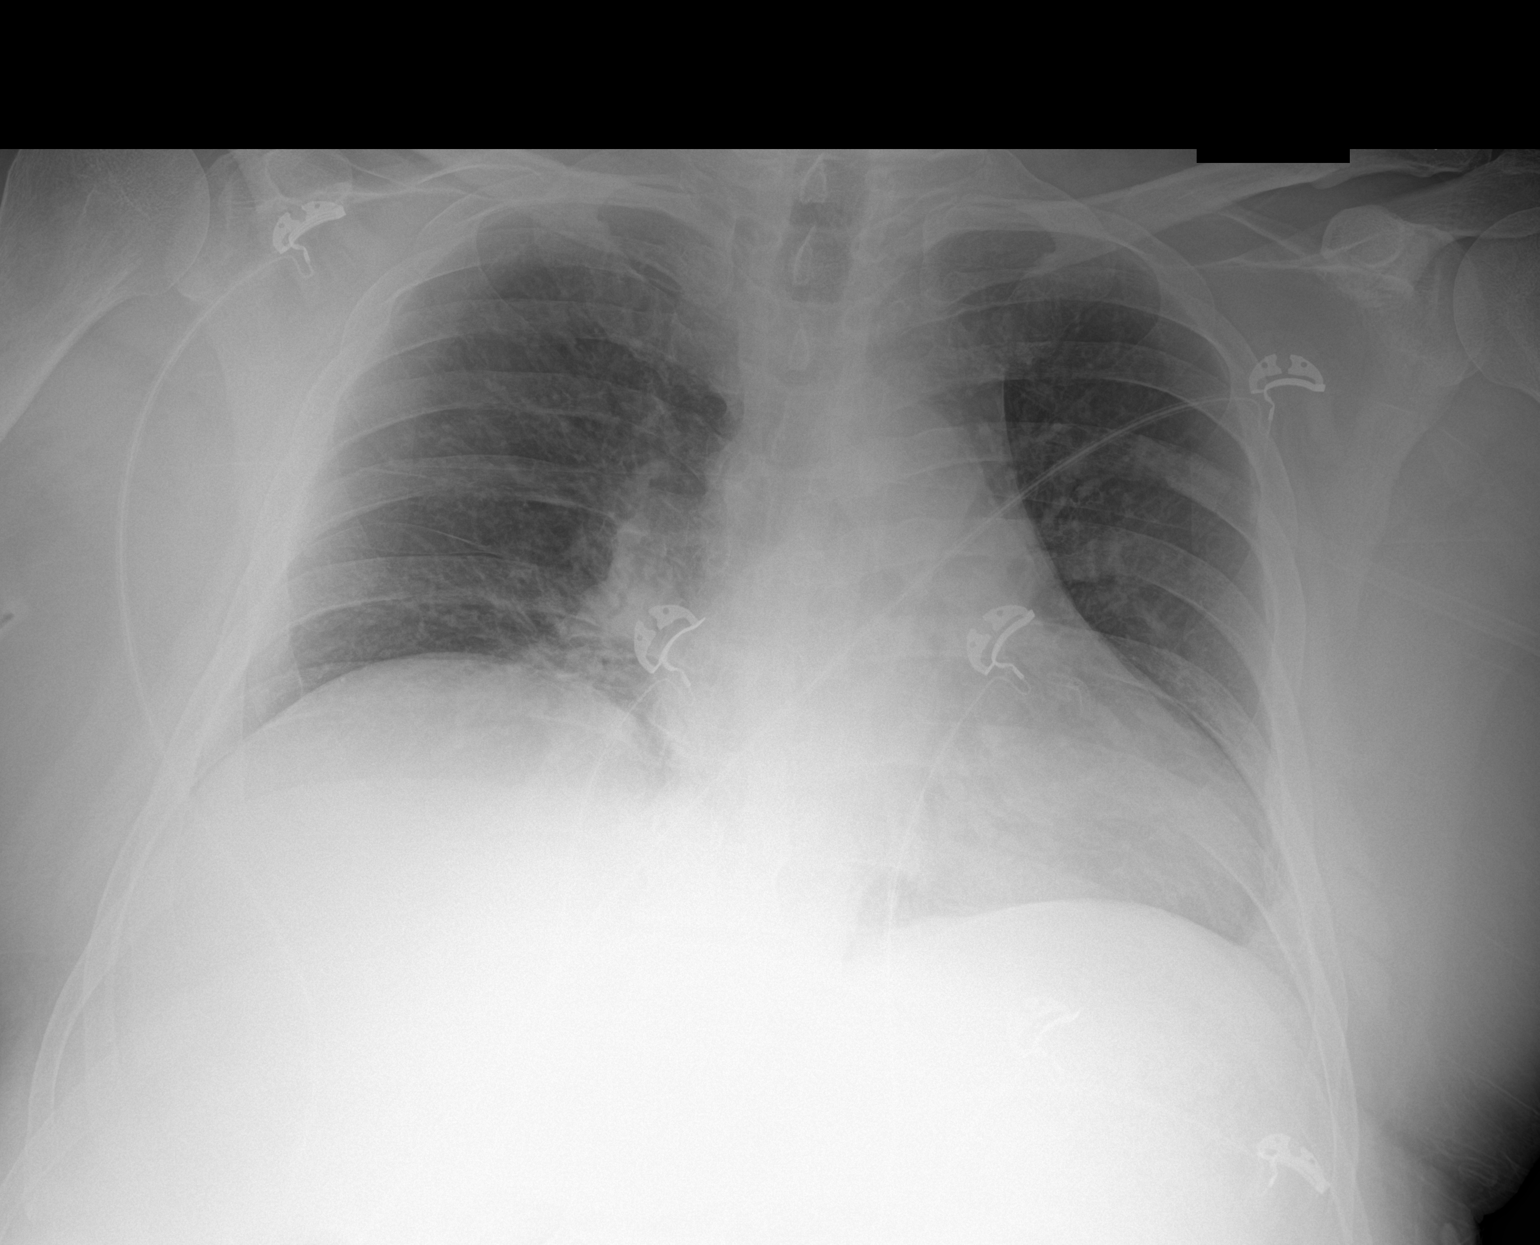

[1 of 1 positions shown; findings below may reference images not displayed]

FINDINGS: Lung volumes are small and there is mild elevation of the right
hemidiaphragm with asymmetric right basilar atelectasis. No
superimposed focal pulmonary infiltrate. No pneumothorax or pleural
effusion. Cardiac size within normal limits. Pulmonary vascularity
normal. No acute bone abnormality.
IMPRESSION: Pulmonary hypoinflation

## 2022-08-19 NOTE — Progress Notes (Unsigned)
PROVIDER NOTE: Information contained herein reflects review and annotations entered in association with encounter. Interpretation of such information and data should be left to medically-trained personnel. Information provided to patient can be located elsewhere in the medical record under "Patient Instructions". Document created using STT-dictation technology, any transcriptional errors that may result from process are unintentional.    Patient: Kenneth Osborn  Service Category: E/M  Provider: Oswaldo Done, MD  DOB: 1955-12-17  DOS: 08/21/2022  Referring Provider: Jerl Mina, MD  MRN: [id number]  Specialty: Interventional Pain Management  PCP: Jerl Mina, MD  Type: Established Patient  Setting: Ambulatory outpatient    Location: Office  Delivery: Face-to-face     HPI  [name], a 67 y.o. year old male, is here today because of his No primary diagnosis found.. Kenneth Osborn primary complain today is No chief complaint on file.  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 [facility] on 01/04/2013); Neurogenic pain; Thoracic facet syndrome (T8-10); Lumbar facet syndrome (Bilateral) (R>L); Cervical facet syndrome (Right); Cervical spondylosis; Lumbar spondylosis; Chronic upper extremity pain (Left); Chronic cervical radicular pain (Left); Chronic upper back pain; History of thoracic spine surgery (S/P T9-10 IVD spacer); Failed back surgical syndrome; Chronic musculoskeletal pain; Chronic lower extremity pain (Left); Chronic Lumbar radicular pain (Left: L4); Arthralgia of shoulder; Chronic tension-type headache, not intractable; 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); Chronic pain syndrome; Headache disorder; Trigger point posterior superior iliac spine (PSIS)  (Right); 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); Polyneuropathy; Type 2 diabetes mellitus with diabetic polyneuropathy, with long-term current use of insulin (HCC); Spondylosis without myelopathy or radiculopathy, lumbosacral region; Frequent falls; Diabetic polyneuropathy associated with diabetes mellitus due to underlying condition (HCC); Neuropathic pain; Musculoskeletal pain; DDD (degenerative disc disease), lumbosacral; Chronic low back pain (Bilateral) w/o sciatica; Renal mass; 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; Malignant neoplasm of left kidney, except renal pelvis (HCC); Chronic low back pain (Right) w/ sciatica (Right); Chronic lower extremity pain (Right); Chronic Lumbar radicular pain (Right: S1); Epidural fibrosis; Acute pain of left knee; Chronic knee pain (Left); Osteoarthritis of knee (Left); Osteoarthritis of patellofemoral joint (Left); and Baker's cyst (tiny) (Left) on their pertinent problem list. Pain Assessment: Severity of   is reported as a  /10. Location:    / . Onset:  . Quality:  . Timing:  . Modifying factor(s):  Marland Kitchen Vitals:  vitals were not taken for this visit.  BMI: Estimated body mass index is 30.72 kg/m as calculated from the following:   Height as of 06/27/22: 5\' 9"  (1.753 m).   Weight as of 06/27/22: 208 lb (94.3 kg). Last encounter: 06/27/2022. Last procedure: 06/04/2022.  Reason for encounter:  *** . ***  Pharmacotherapy Assessment  Analgesic: Oxycodone IR 5 mg, 1 tab PO 5x/day (25 mg/day of oxycodone).  (05/06/2022) "Drug Holiday" process with subsequent change in dosing to 3 times daily. MME/day: 37.5 mg/day.   Monitoring: [place] PMP: PDMP reviewed during this encounter.       Pharmacotherapy: No side-effects or adverse reactions reported. Compliance: No problems identified. Effectiveness: Clinically acceptable.  No notes on file  No results found for: "CBDTHCR" No results found for: "D8THCCBX" No results found for:  "D9THCCBX"  UDS:  Summary  Date Value Ref Range Status  05/22/2022 Note  Final  Comment:    ==================================================================== [employer] Select 13 (MW) ==================================================================== Test                             Result       Flag       Units  Drug Present not Declared for Prescription Verification   Desmethyldiazepam              140          UNEXPECTED ng/mg creat   Oxazepam                       559          UNEXPECTED ng/mg creat   Temazepam                      396          UNEXPECTED ng/mg creat    Desmethyldiazepam, oxazepam, and temazepam are benzodiazepine drugs,    but may also be present as common metabolites of other    benzodiazepine drugs, including diazepam.  Drug Absent but Declared for Prescription Verification   Alprazolam                     Not Detected UNEXPECTED ng/mg creat   Oxycodone                      Not Detected UNEXPECTED ng/mg creat ==================================================================== Test                      Result    Flag   Units      Ref Range   Creatinine              92               mg/dL      >=16 ==================================================================== Declared Medications:  The flagging and interpretation on this report are based on the  following declared medications.  Unexpected results may arise from  inaccuracies in the declared medications.   **Note: The testing scope of this panel includes these medications:   Alprazolam (Xanax)  Oxycodone (OxyIR)   **Note: The testing scope of this panel does not include the  following reported medications:   Aspirin  Cetirizine (Zyrtec)  Cyclobenzaprine (Flexeril)  Diltiazem (Cardizem)  Docusate (Senokot-S)  Esomeprazole (Nexium)  Ezetimibe (Zetia)  Gabapentin (Neurontin)  Glucagon  Insulin (Humulin)  Isosorbide (Imdur)  Magnesium (Mag-Ox)  Meclizine (Antivert)  Metformin  (Glucophage)  Metoprolol (Toprol)  Montelukast (Singulair)  Naloxegol (Movantik)  Naloxone (Narcan)  Nitroglycerin (Nitrostat)  Prasugrel (Effient)  Rosuvastatin (Crestor)  Sennosides (Senokot-S)  Vitamin B3 ==================================================================== For clinical consultation, please call [phone]. ====================================================================       ROS  Constitutional: Denies any fever or chills Gastrointestinal: No reported hemesis, hematochezia, vomiting, or acute GI distress Musculoskeletal: Denies any acute onset joint swelling, redness, loss of ROM, or weakness Neurological: No reported episodes of acute onset apraxia, aphasia, dysarthria, agnosia, amnesia, paralysis, loss of coordination, or loss of consciousness  Medication Review  ALPRAZolam, Glucagon, Magnesium Oxide, aspirin EC, cetirizine, cyclobenzaprine, diazepam, diltiazem, esomeprazole, ezetimibe, gabapentin, insulin regular human CONCENTRATED, isosorbide mononitrate, meclizine, metFORMIN, metoprolol succinate, montelukast, naloxegol oxalate, naloxone, niacin, nitroGLYCERIN, oxyCODONE, prasugrel, rosuvastatin, and senna-docusate  History Review  Allergy: Kenneth Osborn has No Known Allergies. Drug: Kenneth Osborn  reports no history of drug use. Alcohol:  reports no history of alcohol use. Tobacco:  reports that he has never smoked. He has never used smokeless tobacco. Social: Kenneth Osborn  reports that he has never smoked. He has never used smokeless tobacco. He reports that he does not drink alcohol and does not use drugs. Medical:  has a past medical history of Acute postoperative pain (12/03/2018), Allergic rhinitis (12/30/2012), Anginal pain (HCC), Anxiety, Bronchitis, Can't get food down (08/12/2014), Cancer (HCC) (01/2020), Chronic back pain, Concussion (09/2015), Coronary artery disease, DDD (degenerative disc disease), cervical, Dehydration symptoms, Diabetes mellitus  without complication (HCC), Dysphagia, GERD (gastroesophageal reflux disease), History of Meniere's disease (12/21/2014), History of thoracic spine surgery (S/P T9-10 IVD spacer) (12/21/2014), Hypercholesteremia, Hyperlipidemia, Hypertension, Meniere's disease, Myocardial infarction (HCC) (2012), Neuromuscular disorder (HCC), Renal disorder, Severe sepsis (HCC) (02/07/2020), Short-segment Barrett's esophagus, and Sleep apnea. Surgical: Kenneth Osborn  has a past surgical history that includes Cardiovascular stress test; Cardiac catheterization; Labrinthectomy; mastoid shunt (Bilateral); Back surgery; Shoulder arthroscopy with subacromial decompression (Left, 04/06/2012); ARTHRODESIS ANTERIOR ANTERIOR CERVICLE SPINE (01/04/2013); Colonoscopy with propofol (N/A, 09/19/2014); Esophagogastroduodenoscopy (N/A, 09/19/2014); Savory dilation (N/A, 09/19/2014); Spinal cord stimulator implant (Right); Cardiac catheterization (N/A, 06/15/2015); Cardiac catheterization (N/A, 06/15/2015); Pulse generator implant (N/A, 01/18/2019); Lumbar spinal cord simulator lead removal (Right, 08/09/2019); IR Perc Cholecystostomy (02/09/2020); IR Radiologist Eval & Mgmt (03/14/2020); Coronary angioplasty; Appendectomy; Robot assisted laparoscopic nephrectomy (Left, 04/20/2020); and Colonoscopy with propofol (N/A, 04/03/2021). Family: family history includes Cancer in his sister; Diabetes in his maternal grandmother, mother, and paternal grandmother; Heart disease in his father, maternal aunt, maternal uncle, and mother.  Laboratory Chemistry Profile   Renal Lab Results  Component Value Date   BUN 37 (H) 11/02/2021   CREATININE 2.18 (H) 11/02/2021   GFRAA 58 (L) 01/08/2019   GFRNONAA 32 (L) 11/02/2021    Hepatic Lab Results  Component Value Date   AST 33 11/01/2021   ALT 31 11/01/2021   ALBUMIN 4.0 11/01/2021   ALKPHOS 79 11/01/2021   HCVAB NON REACTIVE 02/08/2020   LIPASE 28 12/11/2020   AMMONIA 24 02/07/2020    Electrolytes Lab Results   Component Value Date   NA 138 11/02/2021   K 5.9 (H) 11/02/2021   CL 107 11/02/2021   CALCIUM 9.8 11/02/2021   MG 1.6 (L) 11/02/2021   PHOS 3.1 11/09/2020    Bone No results found for: "VD25OH", "VD125OH2TOT", "UX3244WN0", "UV2536UY4", "25OHVITD1", "25OHVITD2", "25OHVITD3", "TESTOFREE", "TESTOSTERONE"  Inflammation (CRP: Acute Phase) (ESR: Chronic Phase) Lab Results  Component Value Date   CRP 1.4 (H) 03/22/2015   ESRSEDRATE 9 03/22/2015   LATICACIDVEN 1.5 12/11/2020         Note: Above Lab results reviewed.  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 No intravenous contrast was administered.  COMPARISON:  None Available.  FINDINGS: MENISCI  Medial: Intact.  Lateral: Intact.  LIGAMENTS  Cruciates: ACL and PCL are intact.  Collaterals: Medial collateral ligament is intact. Lateral collateral ligament complex is intact.  CARTILAGE  Patellofemoral: Mild chondrosis along the inferior aspect of the patellar facets.  Medial:  No chondral defect.  Lateral:  No chondral defect.  JOINT: Trace joint effusion. Mild edema in the suprapatellar fat pad.  POPLITEAL FOSSA: Miniscule Baker's cyst.  EXTENSOR MECHANISM: Intact quadriceps tendon. Intact patellar tendon.  BONES: No evidence of acute fracture.  No aggressive osseous lesion.  Other: No focal fluid collection.  IMPRESSION: Mild patellofemoral chondrosis along the inferior aspect of the patellar facets.  Mild edema in the suprapatellar fat pad, can be seen in suprapatellar impingement.  No evidence of meniscus tear or ligamentous injury.  Electronically Signed   By: Caprice Renshaw M.D.   On: 05/20/2022 16:38 Note: Reviewed         Physical Exam  General appearance: Well nourished, well developed, and well hydrated. In no apparent acute distress Mental status: Alert, oriented x 3 (person, place, & time)       Respiratory: No evidence of acute respiratory distress Eyes: PERLA Vitals: There were no vitals taken for this visit. BMI: Estimated body mass index is 30.72 kg/m as calculated from the following:   Height as of 06/27/22: 5\' 9"  (1.753 m).   Weight as of 06/27/22: 208 lb (94.3 kg). Ideal: Patient weight not recorded  Assessment   Diagnosis Status  No diagnosis found. Controlled Controlled Controlled   Updated Problems: No problems updated.  Plan of Care  Problem-specific:  No problem-specific Assessment & Plan notes found for this encounter.  Kenneth Osborn has a current medication list which includes the following long-term medication(s): cetirizine, cyclobenzaprine, esomeprazole, ezetimibe, gabapentin, humulin r u-500 kwikpen, metoprolol succinate, montelukast, niacin, and oxycodone.  Pharmacotherapy (Medications Ordered): No orders of the defined types were placed in this encounter.  Orders:  No orders of the defined types were placed in this encounter.  Follow-up plan:   No follow-ups on file.      Interventional Therapies  Risk Factors  Considerations:   EFFIENT Anticoagulation: (Stop: 7-10 days  Re-start: 6 hrs)  Mnire's disease  OSA  GERD  CAD  (B) Carotid Artery Stenosis  s/p CABG  Hx. Cardiac Syncope  T2IDDM  Hx. MI (06/2010)  Unstable Angina   Planned  Pending:   Diagnostic/therapeutic left suprapatellar intra-articular steroid patellofemoral knee injection #1   Under consideration:   Diagnostic/therapeutic left suprapatellar intra-articular steroid patellofemoral knee injection #1 CYP2D6 / CYP3A4 genetic testing.   Completed:   Therapeutic right Racz procedure x1 (12/18/2021) (100/100/70/70-100)  Diagnostic/therapeutic right caudal ESI x1 (11/13/2021)   Palliative right PSIS MNB/TPI x3 (11/13/2017) (100/100/100)  Diagnostic/palliative right lumbar facet MBB x2 (09/22/2018) (100/100/75/75)  Diagnostic/palliative left lumbar facet MBB x2 (09/22/2018) (100/100/75/75)  Palliative left lumbar facet RFA x3 (02/20/2021) (85/85/55/60-70)  Palliative right lumbar facet RFA x3 (01/09/2021) (100/95/50/60-70)    Therapeutic  Palliative (PRN) options:   Palliative right PSIS MNB/TPI  Diagnostic/palliative lumbar facet MBB  Palliative lumbar facet RFA    Pharmacotherapy  Nonopioids transferred 11/22/2019: Magnesium, Flexeril, and Neurontin. Last "Drug Holiday": See 05/06/2022 note.  (To be completed on 05/27/2022)           Recent Visits Date Type Provider Dept  06/27/22 Office Visit Delano Metz, MD [facility]  06/04/22 Procedure visit Delano Metz, MD [facility]  05/22/22 Office Visit Delano Metz, MD [facility]  Showing recent visits within past 90 days and meeting all other requirements Future Appointments Date Type Provider Dept  08/21/22 Appointment Delano Metz, MD [facility]  Showing future appointments within next 90 days and meeting all other requirements  I discussed the assessment and treatment plan with the patient. The patient was provided an opportunity to ask questions and all were answered. The patient agreed with the plan and demonstrated an understanding of the instructions.  Patient  advised to call back or seek an in-person evaluation if the symptoms or condition worsens.  Duration of encounter: *** minutes.  Total time on encounter, as per AMA guidelines included both the face-to-face and non-face-to-face time personally spent by the physician and/or other qualified health care professional(s) on the day of the encounter (includes time in activities that require the physician or other qualified health care professional and does not include time in activities  normally performed by clinical staff). Physician's time may include the following activities when performed: Preparing to see the patient (e.g., pre-charting review of records, searching for previously ordered imaging, lab work, and nerve conduction tests) Review of prior analgesic pharmacotherapies. Reviewing PMP Interpreting ordered tests (e.g., lab work, imaging, nerve conduction tests) Performing post-procedure evaluations, including interpretation of diagnostic procedures Obtaining and/or reviewing separately obtained history Performing a medically appropriate examination and/or evaluation Counseling and educating the patient/family/caregiver Ordering medications, tests, or procedures Referring and communicating with other health care professionals (when not separately reported) Documenting clinical information in the electronic or other health record Independently interpreting results (not separately reported) and communicating results to the patient/ family/caregiver Care coordination (not separately reported)  Note by: Oswaldo Done, MD Date: 08/21/2022; Time: 5:59 AM

## 2022-08-21 ENCOUNTER — Ambulatory Visit (HOSPITAL_BASED_OUTPATIENT_CLINIC_OR_DEPARTMENT_OTHER): Payer: Medicare Other | Admitting: Pain Medicine

## 2022-08-21 DIAGNOSIS — Z91199 Patient's noncompliance with other medical treatment and regimen due to unspecified reason: Secondary | ICD-10-CM

## 2022-08-21 NOTE — Patient Instructions (Signed)
____________________________________________________________________________________________  Opioid Pain Medication Update  To: All patients taking opioid pain medications. (I.e.: hydrocodone, hydromorphone, oxycodone, oxymorphone, morphine, codeine, methadone, tapentadol, tramadol, buprenorphine, fentanyl, etc.)  Re: Updated review of side effects and adverse reactions of opioid analgesics, as well as new information about long term effects of this class of medications.  Direct risks of long-term opioid therapy are not limited to opioid addiction and overdose. Potential medical risks include serious fractures, breathing problems during sleep, hyperalgesia, immunosuppression, chronic constipation, bowel obstruction, myocardial infarction, and tooth decay secondary to xerostomia.  Unpredictable adverse effects that can occur even if you take your medication correctly: Cognitive impairment, respiratory depression, and death. Most people think that if they take their medication "correctly", and "as instructed", that they will be safe. Nothing could be farther from the truth. In reality, a significant amount of recorded deaths associated with the use of opioids has occurred in individuals that had taken the medication for a long time, and were taking their medication correctly. The following are examples of how this can happen: Patient taking his/her medication for a long time, as instructed, without any side effects, is given a certain antibiotic or another unrelated medication, which in turn triggers a "Drug-to-drug interaction" leading to disorientation, cognitive impairment, impaired reflexes, respiratory depression or an untoward event leading to serious bodily harm or injury, including death.  Patient taking his/her medication for a long time, as instructed, without any side effects, develops an acute impairment of liver and/or kidney function. This will lead to a rapid inability of the body to  breakdown and eliminate their pain medication, which will result in effects similar to an "overdose", but with the same medicine and dose that they had always taken. This again may lead to disorientation, cognitive impairment, impaired reflexes, respiratory depression or an untoward event leading to serious bodily harm or injury, including death.  A similar problem will occur with patients as they grow older and their liver and kidney function begins to decrease as part of the aging process.  Background information: Historically, the original case for using long-term opioid therapy to treat chronic noncancer pain was based on safety assumptions that subsequent experience has called into question. In 1996, the American Pain Society and the American Academy of Pain Medicine issued a consensus statement supporting long-term opioid therapy. This statement acknowledged the dangers of opioid prescribing but concluded that the risk for addiction was low; respiratory depression induced by opioids was short-lived, occurred mainly in opioid-naive patients, and was antagonized by pain; tolerance was not a common problem; and efforts to control diversion should not constrain opioid prescribing. This has now proven to be wrong. Experience regarding the risks for opioid addiction, misuse, and overdose in community practice has failed to support these assumptions.  According to the Centers for Disease Control and Prevention, fatal overdoses involving opioid analgesics have increased sharply over the past decade. Currently, more than 96,700 people die from drug overdoses every year. Opioids are a factor in 7 out of every 10 overdose deaths. Deaths from drug overdose have surpassed motor vehicle accidents as the leading cause of death for individuals between the ages of 35 and 54.  Clinical data suggest that neuroendocrine dysfunction may be very common in both men and women, potentially causing hypogonadism, erectile  dysfunction, infertility, decreased libido, osteoporosis, and depression. Recent studies linked higher opioid dose to increased opioid-related mortality. Controlled observational studies reported that long-term opioid therapy may be associated with increased risk for cardiovascular events. Subsequent meta-analysis concluded   that the safety of long-term opioid therapy in elderly patients has not been proven.   Side Effects and adverse reactions: Common side effects: Drowsiness (sedation). Dizziness. Nausea and vomiting. Constipation. Physical dependence -- Dependence often manifests with withdrawal symptoms when opioids are discontinued or decreased. Tolerance -- As you take repeated doses of opioids, you require increased medication to experience the same effect of pain relief. Respiratory depression -- This can occur in healthy people, especially with higher doses. However, people with COPD, asthma or other lung conditions may be even more susceptible to fatal respiratory impairment.  Uncommon side effects: An increased sensitivity to feeling pain and extreme response to pain (hyperalgesia). Chronic use of opioids can lead to this. Delayed gastric emptying (the process by which the contents of your stomach are moved into your small intestine). Muscle rigidity. Immune system and hormonal dysfunction. Quick, involuntary muscle jerks (myoclonus). Arrhythmia. Itchy skin (pruritus). Dry mouth (xerostomia).  Long-term side effects: Chronic constipation. Sleep-disordered breathing (SDB). Increased risk of bone fractures. Hypothalamic-pituitary-adrenal dysregulation. Increased risk of overdose.  RISKS: Fractures and Falls:  Opioids increase the risk and incidence of falls. This is of particular importance in elderly patients.  Endocrine System:  Long-term administration is associated with endocrine abnormalities (endocrinopathies). (Also known as Opioid-induced Endocrinopathy) Influences  on both the hypothalamic-pituitary-adrenal axis?and the hypothalamic-pituitary-gonadal axis have been demonstrated with consequent hypogonadism and adrenal insufficiency in both sexes. Hypogonadism and decreased levels of dehydroepiandrosterone sulfate have been reported in men and women. Endocrine effects include: Amenorrhoea in women (abnormal absence of menstruation) Reduced libido in both sexes Decreased sexual function Erectile dysfunction in men Hypogonadisms (decreased testicular function with shrinkage of testicles) Infertility Depression and fatigue Loss of muscle mass Anxiety Depression Immune suppression Hyperalgesia Weight gain Anemia Osteoporosis Patients (particularly women of childbearing age) should avoid opioids. There is insufficient evidence to recommend routine monitoring of asymptomatic patients taking opioids in the long-term for hormonal deficiencies.  Immune System: Human studies have demonstrated that opioids have an immunomodulating effect. These effects are mediated via opioid receptors both on immune effector cells and in the central nervous system. Opioids have been demonstrated to have adverse effects on antimicrobial response and anti-tumour surveillance. Buprenorphine has been demonstrated to have no impact on immune function.  Opioid Induced Hyperalgesia: Human studies have demonstrated that prolonged use of opioids can lead to a state of abnormal pain sensitivity, sometimes called opioid induced hyperalgesia (OIH). Opioid induced hyperalgesia is not usually seen in the absence of tolerance to opioid analgesia. Clinically, hyperalgesia may be diagnosed if the patient on long-term opioid therapy presents with increased pain. This might be qualitatively and anatomically distinct from pain related to disease progression or to breakthrough pain resulting from development of opioid tolerance. Pain associated with hyperalgesia tends to be more diffuse than the  pre-existing pain and less defined in quality. Management of opioid induced hyperalgesia requires opioid dose reduction.  Cancer: Chronic opioid therapy has been associated with an increased risk of cancer among noncancer patients with chronic pain. This association was more evident in chronic strong opioid users. Chronic opioid consumption causes significant pathological changes in the small intestine and colon. Epidemiological studies have found that there is a link between opium dependence and initiation of gastrointestinal cancers. Cancer is the second leading cause of death after cardiovascular disease. Chronic use of opioids can cause multiple conditions such as GERD, immunosuppression and renal damage as well as carcinogenic effects, which are associated with the incidence of cancers.   Mortality: Long-term opioid use   has been associated with increased mortality among patients with chronic non-cancer pain (CNCP).  Prescription of long-acting opioids for chronic noncancer pain was associated with a significantly increased risk of all-cause mortality, including deaths from causes other than overdose.  Reference: Von Korff M, Kolodny A, Deyo RA, Chou R. Long-term opioid therapy reconsidered. Ann Intern Med. 2011 Sep 6;155(5):325-8. doi: 10.7326/[id number]. PMID: 21893626; PMCID: PMC3280085. Bedson J, Chen Y, Ashworth J, Hayward RA, Dunn KM, Jordan KP. Risk of adverse events in patients prescribed long-term opioids: A cohort study in the [place] Clinical Practice Research Datalink. Eur J Pain. 2019 May;23(5):908-922. doi: 10.1002/ejp.1357. Epub 2019 Jan 31. PMID: 30620116. Colameco S, Coren JS, Ciervo CA. Continuous opioid treatment for chronic noncancer pain: a time for moderation in prescribing. Postgrad Med. 2009 Jul;121(4):61-6. doi: 10.3810/pgm.2009.07.2032. PMID: 19641271. Chou R, Turner JA, Devine EB, Hansen RN, Sullivan SD, Blazina I, Dana T, Bougatsos C, Deyo RA. The  effectiveness and risks of long-term opioid therapy for chronic pain: a systematic review for a National Institutes of Health Pathways to Prevention Workshop. Ann Intern Med. 2015 Feb 17;162(4):276-86. doi: 10.7326/M14-2559. PMID: 25581257. Warner M, Chen LH, Makuc DM. NCHS Data Brief No. 22. [place]: Centers for Disease Control and Prevention; 2009. Sep, Increase in Fatal Poisonings Involving Opioid Analgesics in the [place], 1999-2006. Song IA, Choi HR, Oh TK. Long-term opioid use and mortality in patients with chronic non-cancer pain: Ten-year follow-up study in [name] from 2010 through 2019. EClinicalMedicine. 2022 Jul 18;51:101558. doi: 10.1016/j.eclinm.2022.101558. PMID: 35875817; PMCID: PMC9304910. Huser, W., Schubert, T., Vogelmann, T. et al. All-cause mortality in patients with long-term opioid therapy compared with non-opioid analgesics for chronic non-cancer pain: a database study. BMC Med 18, 162 (2020). https://doi.org/10.1186/[email or web address] Rashidian H, Zendehdel K, Kamangar F, Malekzadeh R, Haghdoost AA. An Ecological Study of the Association between Opiate Use and Incidence of Cancers. Addict Health. 2016 Fall;8(4):252-260. PMID: 28819556; PMCID: PMC5554805.  Our Goal: Our goal is to control your pain with means other than the use of opioid pain medications.  Our Recommendation: Talk to your physician about coming off of these medications. We can assist you with the tapering down and stopping these medicines. Based on the new information, even if you cannot completely stop the medication, a decrease in the dose may be associated with a lesser risk. Ask for other means of controlling the pain. Decrease or eliminate those factors that significantly contribute to your pain such as smoking, obesity, and a diet heavily tilted towards "inflammatory" nutrients.  Last Updated: 04/17/2022    ____________________________________________________________________________________________     ____________________________________________________________________________________________  Transfer of Pain Medication between Pharmacies  Re: 2023 DEA Clarification on existing regulation  Published on DEA Website: October 19, 2021  Title: Revised Regulation Allows DEA-Registered Pharmacies to Transfer Electronic Prescriptions at a Patient's Request DEA Headquarters Division - Public Information Office  "Patients now have the ability to request their electronic prescription be transferred to another pharmacy without having to go back to their practitioner to initiate the request. This revised regulation went into effect on Monday, October 15, 2021.     At a patient's request, a DEA-registered retail pharmacy can now transfer an electronic prescription for a controlled substance (schedules II-V) to another DEA-registered retail pharmacy. Prior to this change, patients would have to go through their practitioner to cancel their prescription and have it re-issued to a different pharmacy. The process was taxing and time consuming for both patients and practitioners.    The Drug Enforcement Administration (DEA) published its intent to revise the   process for transferring electronic prescriptions on January 07, 2020.  The final rule was published in the federal register on September 13, 2021 and went into effect 30 days later.  Under the final rule, a prescription can only be transferred once between pharmacies, and only if allowed under existing state or other applicable law. The prescription must remain in its electronic form; may not be altered in any way; and the transfer must be communicated directly between two licensed pharmacists. It's important to note, any authorized refills transfer with the original prescription, which means the entire prescription will be filled at the same pharmacy."     REFERENCES: 1. DEA website announcement https://www.dea.gov/stories/2023/2023-10/2021-09-01/revised-regulation-allows-dea-registered-pharmacies-transfer  2. Department of Justice website  https://www.govinfo.gov/content/pkg/FR-2021-09-13/pdf/2023-15847.pdf  3. DEPARTMENT OF JUSTICE Drug Enforcement Administration 21 CFR Part 1306 [Docket No. DEA-637] RIN [id number] "Transfer of Electronic Prescriptions for Schedules II-V Controlled Substances Between Pharmacies for Initial Filling"  ____________________________________________________________________________________________     _______________________________________________________________________  Medication Rules  Purpose: To inform patients, and their family members, of our medication rules and regulations.  Applies to: All patients receiving prescriptions from our practice (written or electronic).  Pharmacy of record: This is the pharmacy where your electronic prescriptions will be sent. Make sure we have the correct one.  Electronic prescriptions: In compliance with the [place] Strengthen Opioid Misuse Prevention (STOP) Act of 2017 (Session Law 2017-74/H243), effective February 18, 2018, all controlled substances must be electronically prescribed. Written prescriptions, faxing, or calling prescriptions to a pharmacy will no longer be done.  Prescription refills: These will be provided only during in-person appointments. No medications will be renewed without a "face-to-face" evaluation with your provider. Applies to all prescriptions.  NOTE: The following applies primarily to controlled substances (Opioid* Pain Medications).   Type of encounter (visit): For patients receiving controlled substances, face-to-face visits are required. (Not an option and not up to the patient.)  Patient's responsibilities: Pain Pills: Bring all pain pills to every appointment (except for procedure appointments). Pill Bottles: Bring  pills in original pharmacy bottle. Bring bottle, even if empty. Always bring the bottle of the most recent fill.  Medication refills: You are responsible for knowing and keeping track of what medications you are taking and when is it that you will need a refill. The day before your appointment: write a list of all prescriptions that need to be refilled. The day of the appointment: give the list to the admitting nurse. Prescriptions will be written only during appointments. No prescriptions will be written on procedure days. If you forget a medication: it will not be "Called in", "Faxed", or "electronically sent". You will need to get another appointment to get these prescribed. No early refills. Do not call asking to have your prescription filled early. Partial  or short prescriptions: Occasionally your pharmacy may not have enough pills to fill your prescription.  NEVER ACCEPT a partial fill or a prescription that is short of the total amount of pills that you were prescribed.  With controlled substances the law allows 72 hours for the pharmacy to complete the prescription.  If the prescription is not completed within 72 hours, the pharmacist will require a new prescription to be written. This means that you will be short on your medicine and we WILL NOT send another prescription to complete your original prescription.  Instead, request the pharmacy to send a carrier to a nearby branch to get enough medication to provide you with your full prescription. Prescription Accuracy: You are responsible for carefully inspecting your   prescriptions before leaving our office. Have the discharge nurse carefully go over each prescription with you, before taking them home. Make sure that your name is accurately spelled, that your address is correct. Check the name and dose of your medication to make sure it is accurate. Check the number of pills, and the written instructions to make sure they are clear and accurate. Make  sure that you are given enough medication to last until your next medication refill appointment. Taking Medication: Take medication as prescribed. When it comes to controlled substances, taking less pills or less frequently than prescribed is permitted and encouraged. Never take more pills than instructed. Never take the medication more frequently than prescribed.  Inform other Doctors: Always inform, all of your healthcare providers, of all the medications you take. Pain Medication from other Providers: You are not allowed to accept any additional pain medication from any other Doctor or Healthcare provider. There are two exceptions to this rule. (see below) In the event that you require additional pain medication, you are responsible for notifying us, as stated below. Cough Medicine: Often these contain an opioid, such as codeine or hydrocodone. Never accept or take cough medicine containing these opioids if you are already taking an opioid* medication. The combination may cause respiratory failure and death. Medication Agreement: You are responsible for carefully reading and following our Medication Agreement. This must be signed before receiving any prescriptions from our practice. Safely store a copy of your signed Agreement. Violations to the Agreement will result in no further prescriptions. (Additional copies of our Medication Agreement are available upon request.) Laws, Rules, & Regulations: All patients are expected to follow all Federal and State Laws, Statutes, Rules, & Regulations. Ignorance of the Laws does not constitute a valid excuse.  Illegal drugs and Controlled Substances: The use of illegal substances (including, but not limited to marijuana and its derivatives) and/or the illegal use of any controlled substances is strictly prohibited. Violation of this rule may result in the immediate and permanent discontinuation of any and all prescriptions being written by our practice. The use of  any illegal substances is prohibited. Adopted CDC guidelines & recommendations: Target dosing levels will be at or below 60 MME/day. Use of benzodiazepines** is not recommended.  Exceptions: There are only two exceptions to the rule of not receiving pain medications from other Healthcare Providers. Exception #1 (Emergencies): In the event of an emergency (i.e.: accident requiring emergency care), you are allowed to receive additional pain medication. However, you are responsible for: As soon as you are able, call our office [phone], at any time of the day or night, and leave a message stating your name, the date and nature of the emergency, and the name and dose of the medication prescribed. In the event that your call is answered by a member of our staff, make sure to document and save the date, time, and the name of the person that took your information.  Exception #2 (Planned Surgery): In the event that you are scheduled by another doctor or dentist to have any type of surgery or procedure, you are allowed (for a period no longer than 30 days), to receive additional pain medication, for the acute post-op pain. However, in this case, you are responsible for picking up a copy of our "Post-op Pain Management for Surgeons" handout, and giving it to your surgeon or dentist. This document is available at our office, and does not require an appointment to obtain it. Simply go to   our office during business hours (Monday-Thursday from 8:00 AM to 4:00 PM) (Friday 8:00 AM to 12:00 Noon) or if you have a scheduled appointment with us, prior to your surgery, and ask for it by name. In addition, you are responsible for: calling our office [phone], at any time of the day or night, and leaving a message stating your name, name of your surgeon, type of surgery, and date of procedure or surgery. Failure to comply with your responsibilities may result in termination of therapy involving the controlled  substances. Medication Agreement Violation. Following the above rules, including your responsibilities will help you in avoiding a Medication Agreement Violation ("Breaking your Pain Medication Contract").  Consequences:  Not following the above rules may result in permanent discontinuation of medication prescription therapy.  *Opioid medications include: morphine, codeine, oxycodone, oxymorphone, hydrocodone, hydromorphone, meperidine, tramadol, tapentadol, buprenorphine, fentanyl, methadone. **Benzodiazepine medications include: diazepam (Valium), alprazolam (Xanax), clonazepam (Klonopine), lorazepam (Ativan), clorazepate (Tranxene), chlordiazepoxide (Librium), estazolam (Prosom), oxazepam (Serax), temazepam (Restoril), triazolam (Halcion) (Last updated: 12/11/2021) ______________________________________________________________________    ______________________________________________________________________  Medication Recommendations and Reminders  Applies to: All patients receiving prescriptions (written and/or electronic).  Medication Rules & Regulations: You are responsible for reading, knowing, and following our "Medication Rules" document. These exist for your safety and that of others. They are not flexible and neither are we. Dismissing or ignoring them is an act of "non-compliance" that may result in complete and irreversible termination of such medication therapy. For safety reasons, "non-compliance" will not be tolerated. As with the [place] fundamental legal principle of "ignorance of the law is no defense", we will accept no excuses for not having read and knowing the content of documents provided to you by our practice.  Pharmacy of record:  Definition: This is the pharmacy where your electronic prescriptions will be sent.  We do not endorse any particular pharmacy. It is up to you and your insurance to decide what pharmacy to use.  We do not restrict you in your choice of  pharmacy. However, once we write for your prescriptions, we will NOT be re-sending more prescriptions to fix restricted supply problems created by your pharmacy, or your insurance.  The pharmacy listed in the electronic medical record should be the one where you want electronic prescriptions to be sent. If you choose to change pharmacy, simply notify our nursing staff. Changes will be made only during your regular appointments and not over the phone.  Recommendations: Keep all of your pain medications in a safe place, under lock and key, even if you live alone. We will NOT replace lost, stolen, or damaged medication. We do not accept "Police Reports" as proof of medications having been stolen. After you fill your prescription, take 1 week's worth of pills and put them away in a safe place. You should keep a separate, properly labeled bottle for this purpose. The remainder should be kept in the original bottle. Use this as your primary supply, until it runs out. Once it's gone, then you know that you have 1 week's worth of medicine, and it is time to come in for a prescription refill. If you do this correctly, it is unlikely that you will ever run out of medicine. To make sure that the above recommendation works, it is very important that you make sure your medication refill appointments are scheduled at least 1 week before you run out of medicine. To do this in an effective manner, make sure that you do not leave the office without   scheduling your next medication management appointment. Always ask the nursing staff to show you in your prescription , when your medication will be running out. Then arrange for the receptionist to get you a return appointment, at least 7 days before you run out of medicine. Do not wait until you have 1 or 2 pills left, to come in. This is very poor planning and does not take into consideration that we may need to cancel appointments due to bad weather, sickness, or emergencies  affecting our staff. DO NOT ACCEPT A "Partial Fill": If for any reason your pharmacy does not have enough pills/tablets to completely fill or refill your prescription, do not allow for a "partial fill". The law allows the pharmacy to complete that prescription within 72 hours, without requiring a new prescription. If they do not fill the rest of your prescription within those 72 hours, you will need a separate prescription to fill the remaining amount, which we will NOT provide. If the reason for the partial fill is your insurance, you will need to talk to the pharmacist about payment alternatives for the remaining tablets, but again, DO NOT ACCEPT A PARTIAL FILL, unless you can trust your pharmacist to obtain the remainder of the pills within 72 hours.  Prescription refills and/or changes in medication(s):  Prescription refills, and/or changes in dose or medication, will be conducted only during scheduled medication management appointments. (Applies to both, written and electronic prescriptions.) No refills on procedure days. No medication will be changed or started on procedure days. No changes, adjustments, and/or refills will be conducted on a procedure day. Doing so will interfere with the diagnostic portion of the procedure. No phone refills. No medications will be "called into the pharmacy". No Fax refills. No weekend refills. No Holliday refills. No after hours refills.  Remember:  Business hours are:  Monday to Thursday 8:00 AM to 4:00 PM Provider's Schedule: Parish Augustine, MD - Appointments are:  Medication management: Monday and Wednesday 8:00 AM to 4:00 PM Procedure day: Tuesday and Thursday 7:30 AM to 4:00 PM Bilal Lateef, MD - Appointments are:  Medication management: Tuesday and Thursday 8:00 AM to 4:00 PM Procedure day: Monday and Wednesday 7:30 AM to 4:00 PM (Last update: 12/11/2021) ______________________________________________________________________    ____________________________________________________________________________________________  Naloxone Nasal Spray  Why am I receiving this medication? [place] STOP ACT requires that all patients taking high dose opioids or at risk of opioids respiratory depression, be prescribed an opioid reversal agent, such as Naloxone (AKA: Narcan).  What is this medication? NALOXONE (nal OX one) treats opioid overdose, which causes slow or shallow breathing, severe drowsiness, or trouble staying awake. Call emergency services after using this medication. You may need additional treatment. Naloxone works by reversing the effects of opioids. It belongs to a group of medications called opioid blockers.  COMMON BRAND NAME(S): Kloxxado, Narcan  What should I tell my care team before I take this medication? They need to know if you have any of these conditions: Heart disease Substance use disorder An unusual or allergic reaction to naloxone, other medications, foods, dyes, or preservatives Pregnant or trying to get pregnant Breast-feeding  When to use this medication? This medication is to be used for the treatment of respiratory depression (less than 8 breaths per minute) secondary to opioid overdose.   How to use this medication? This medication is for use in the nose. Lay the person on their back. Support their neck with your hand and allow the head to tilt   back before giving the medication. The nasal spray should be given into 1 nostril. After giving the medication, move the person onto their side. Do not remove or test the nasal spray until ready to use. Get emergency medical help right away after giving the first dose of this medication, even if the person wakes up. You should be familiar with how to recognize the signs and symptoms of a narcotic overdose. If more doses are needed, give the additional dose in the other nostril. Talk to your care team about the use of this medication in children.  While this medication may be prescribed for children as young as newborns for selected conditions, precautions do apply.  Naloxone Overdosage: If you think you have taken too much of this medicine contact a poison control center or emergency room at once.  NOTE: This medicine is only for you. Do not share this medicine with others.  What if I miss a dose? This does not apply.  What may interact with this medication? This is only used during an emergency. No interactions are expected during emergency use. This list may not describe all possible interactions. Give your health care provider a list of all the medicines, herbs, non-prescription drugs, or dietary supplements you use. Also tell them if you smoke, drink alcohol, or use illegal drugs. Some items may interact with your medicine.  What should I watch for while using this medication? Keep this medication ready for use in the case of an opioid overdose. Make sure that you have the phone number of your care team and local hospital ready. You may need to have additional doses of this medication. Each nasal spray contains a single dose. Some emergencies may require additional doses. After use, bring the treated person to the nearest hospital or call 911. Make sure the treating care team knows that the person has received a dose of this medication. You will receive additional instructions on what to do during and after use of this medication before an emergency occurs.  What side effects may I notice from receiving this medication? Side effects that you should report to your care team as soon as possible: Allergic reactions--skin rash, itching, hives, swelling of the face, lips, tongue, or throat Side effects that usually do not require medical attention (report these to your care team if they continue or are bothersome): Constipation Dryness or irritation inside the nose Headache Increase in blood pressure Muscle spasms Stuffy  nose Toothache This list may not describe all possible side effects. Call your doctor for medical advice about side effects. You may report side effects to FDA at 1-800-FDA-1088.  Where should I keep my medication? Because this is an emergency medication, you should keep it with you at all times.  Keep out of the reach of children and pets. Store between 20 and 25 degrees C (68 and 77 degrees F). Do not freeze. Throw away any unused medication after the expiration date. Keep in original box until ready to use.  NOTE: This sheet is a summary. It may not cover all possible information. If you have questions about this medicine, talk to your doctor, pharmacist, or health care provider.   2023 [employer] Standard (2020-10-13 00:00:00)  ____________________________________________________________________________________________   

## 2022-09-01 NOTE — Progress Notes (Signed)
PROVIDER NOTE: Information contained herein reflects review and annotations entered in association with encounter. Interpretation of such information and data should be left to medically-trained personnel. Information provided to patient can be located elsewhere in the medical record under "Patient Instructions". Document created using STT-dictation technology, any transcriptional errors that may result from process are unintentional.    Patient: Kenneth Osborn  Service Category: E/M  Provider: Oswaldo Done, MD  DOB: 10/14/1955  DOS: 09/02/2022  Referring Provider: Jerl Mina, MD  MRN: [id number]  Specialty: Interventional Pain Management  PCP: Jerl Mina, MD  Type: Established Patient  Setting: Ambulatory outpatient    Location: Office  Delivery: Face-to-face     HPI  Kenneth Osborn, a 67 y.o. year old male, is here today because of his Chronic pain syndrome [G89.4]. 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 [facility] on 01/04/2013); Neurogenic pain; Thoracic facet syndrome (T8-10); Lumbar facet syndrome (Bilateral) (R>L); Cervical facet syndrome (Right); Cervical spondylosis; Lumbar spondylosis; Chronic upper extremity pain (Left); Chronic cervical radicular pain (Left); Chronic upper back pain; History of thoracic spine surgery (S/P T9-10 IVD spacer); Failed back surgical syndrome; Chronic musculoskeletal pain; Chronic lower extremity pain (Left); Chronic Lumbar radicular pain (Left: L4); Arthralgia of shoulder; Chronic tension-type headache, not intractable; 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); Chronic pain syndrome; Headache disorder; Trigger point posterior superior iliac spine  (PSIS) (Right); 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); Polyneuropathy; Type 2 diabetes mellitus with diabetic polyneuropathy, with long-term current use of insulin (HCC); Spondylosis without myelopathy or radiculopathy, lumbosacral region; Frequent falls; Diabetic polyneuropathy associated with diabetes mellitus due to underlying condition (HCC); Neuropathic pain; Musculoskeletal pain; DDD (degenerative disc disease), lumbosacral; Chronic low back pain (Bilateral) w/o sciatica; Renal mass; 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; Malignant neoplasm of left kidney, except renal pelvis (HCC); Chronic low back pain (Right) w/ sciatica (Right); Chronic lower extremity pain (Right); Chronic Lumbar radicular pain (Right: S1); Epidural fibrosis; Acute pain of left knee; Chronic knee pain (Left); Osteoarthritis of knee (Left); Osteoarthritis of patellofemoral joint (Left); Baker's cyst (tiny) (Left); and Lumbar facet joint pain on their pertinent problem list. Pain Assessment: Severity of Chronic pain is reported as a 3 /10. Location: Back Lower/upper back at times and left leg. Onset: More than a month ago. Quality: Archie Patten. Timing: Constant. Modifying factor(s): medications, walking,. Vitals:  height is 5\' 9"  (1.753 m) and weight is 225 lb (102.1 kg). His temporal temperature is 97 F (36.1 C) (abnormal). His blood pressure is 126/81 and his pulse is 78. His respiration is 18 and oxygen saturation is 100%.  BMI: Estimated body mass index is 33.23 kg/m as calculated from the following:   Height as of this encounter: 5\' 9"  (1.753 m).   Weight as of this encounter: 225 lb (102.1 kg). Last encounter: 08/21/2022. Last procedure: 06/04/2022.  Reason for encounter: medication management.  The patient indicates doing well with the current medication regimen. No adverse reactions or side effects reported to the medications.   In addition, the patient indicates beginning  to have increased pain in the lower back.  He recently came back from a long car trip to [place].  Since then he has been experiencing more  pain in the lower back.  At this time he refers that it seems to be worse on the left side.  He also refers having had excellent benefit from the radiofrequency ablation of the lumbar facets and would like to have this repeated.  He is still taking Effient and therefore he will need to be off of it for 7 to 10 days prior to the procedure.  He is aware of that as well asked the risk and possible complications of coming off of the blood thinners.  The last time that he had a radiofrequency ablation of the left Lumbar facets was on 02/20/2021.  That was his third radiofrequency ablation on that side.  At the time of his follow-up evaluation 6 weeks after the procedure he had an ongoing 70% relief of his left lower back pain.  The right side was last done on 01/09/2021.  That was also the third radiofrequency done on the right side of the lumbar spine.  On follow-up 6 weeks after the procedure the patient again was in going an ongoing 70% relief of his low back pain.  He refers that the 2 radiofrequency ablations helped his pain considerably.  He would like to have those repeated.  Currently he refers not having any lower extremity pain, weakness, or numbness compatible with a radiculopathy.  He does have a failed back surgical syndrome that intermittently will cause him to have radicular symptoms specially on the left lower extremity.  Right now he is not having that and his primary pain is in the lower back.  Physical exam continues to be compatible with lumbar facet arthropathy/arthralgia.  Last available set of x-rays was done on 08/09/2019.  Today I will be entering orders to repeat x-rays of the lumbar spine with flexion and extension views.  The last time that we did any x-rays with flexion and extension was on 12/26/2016.  The patient also had a spinal cord stimulator which  was implanted on 01/18/2019 and apparently removed on 08/09/2019.  Routine UDS ordered today.   RTCB: 12/01/2022   Pharmacotherapy Assessment  Analgesic: Oxycodone IR 5 mg, 1 tab PO TID MME/day: 37.5 mg/day.   Monitoring: [place] PMP: PDMP reviewed during this encounter.       Pharmacotherapy: No side-effects or adverse reactions reported. Compliance: No problems identified. Effectiveness: Clinically acceptable.  Rickey Barbara, RN  09/02/2022  8:06 AM  Signed Nursing Pain Medication Assessment:  Safety precautions to be maintained throughout the outpatient stay will include: orient to surroundings, keep bed in low position, maintain call bell within reach at all times, provide assistance with transfer out of bed and ambulation.  Medication Inspection Compliance: Pill count conducted under aseptic conditions, in front of the patient. Neither the pills nor the bottle was removed from the patient's sight at any time. Once count was completed pills were immediately returned to the patient in their original bottle.  Medication: Oxycodone IR Pill/Patch Count:  2 of 90 pills remain Pill/Patch Appearance: Markings consistent with prescribed medication Bottle Appearance: Standard pharmacy container. Clearly labeled. Filled Date: 06 / 07 / 2024 Last Medication intake:  Today    No results found for: "CBDTHCR" No results found for: "D8THCCBX" No results found for: "D9THCCBX"  UDS:  Summary  Date Value Ref Range Status  05/22/2022 Note  Final    Comment:    ==================================================================== ToxASSURE Select 13 (MW) ==================================================================== Test  Result       Flag       Units  Drug Present not Declared for Prescription Verification   Desmethyldiazepam              140          UNEXPECTED ng/mg creat   Oxazepam                       559          UNEXPECTED ng/mg creat   Temazepam                       396          UNEXPECTED ng/mg creat    Desmethyldiazepam, oxazepam, and temazepam are benzodiazepine drugs,    but may also be present as common metabolites of other    benzodiazepine drugs, including diazepam.  Drug Absent but Declared for Prescription Verification   Alprazolam                     Not Detected UNEXPECTED ng/mg creat   Oxycodone                      Not Detected UNEXPECTED ng/mg creat ==================================================================== Test                      Result    Flag   Units      Ref Range   Creatinine              92               mg/dL      >=82 ==================================================================== Declared Medications:  The flagging and interpretation on this report are based on the  following declared medications.  Unexpected results may arise from  inaccuracies in the declared medications.   **Note: The testing scope of this panel includes these medications:   Alprazolam (Xanax)  Oxycodone (OxyIR)   **Note: The testing scope of this panel does not include the  following reported medications:   Aspirin  Cetirizine (Zyrtec)  Cyclobenzaprine (Flexeril)  Diltiazem (Cardizem)  Docusate (Senokot-S)  Esomeprazole (Nexium)  Ezetimibe (Zetia)  Gabapentin (Neurontin)  Glucagon  Insulin (Humulin)  Isosorbide (Imdur)  Magnesium (Mag-Ox)  Meclizine (Antivert)  Metformin (Glucophage)  Metoprolol (Toprol)  Montelukast (Singulair)  Naloxegol (Movantik)  Naloxone (Narcan)  Nitroglycerin (Nitrostat)  Prasugrel (Effient)  Rosuvastatin (Crestor)  Sennosides (Senokot-S)  Vitamin B3 ==================================================================== For clinical consultation, please call [phone]. ====================================================================       ROS  Constitutional: Denies any fever or chills Gastrointestinal: No reported hemesis, hematochezia, vomiting, or acute GI  distress Musculoskeletal: Denies any acute onset joint swelling, redness, loss of ROM, or weakness Neurological: No reported episodes of acute onset apraxia, aphasia, dysarthria, agnosia, amnesia, paralysis, loss of coordination, or loss of consciousness  Medication Review  ALPRAZolam, Glucagon, Magnesium Oxide, aspirin EC, cetirizine, cyclobenzaprine, diazepam, diltiazem, esomeprazole, ezetimibe, gabapentin, insulin regular human CONCENTRATED, isosorbide mononitrate, meclizine, metFORMIN, metoprolol succinate, montelukast, naloxone, niacin, nitroGLYCERIN, oxyCODONE, prasugrel, rosuvastatin, and senna-docusate  History Review  Allergy: Kenneth Osborn has No Known Allergies. Drug: Kenneth Osborn  reports no history of drug use. Alcohol:  reports no history of alcohol use. Tobacco:  reports that he has never smoked. He has never used smokeless tobacco. Social: Kenneth Osborn  reports that he has never smoked. He has never used smokeless tobacco. He reports that he  does not drink alcohol and does not use drugs. Medical:  has a past medical history of Acute postoperative pain (12/03/2018), Allergic rhinitis (12/30/2012), Anginal pain (HCC), Anxiety, Bronchitis, Can't get food down (08/12/2014), Cancer (HCC) (01/2020), Chronic back pain, Concussion (09/2015), Coronary artery disease, DDD (degenerative disc disease), cervical, Dehydration symptoms, Diabetes mellitus without complication (HCC), Dysphagia, GERD (gastroesophageal reflux disease), History of Meniere's disease (12/21/2014), History of thoracic spine surgery (S/P T9-10 IVD spacer) (12/21/2014), Hypercholesteremia, Hyperlipidemia, Hypertension, Meniere's disease, Myocardial infarction (HCC) (2012), Neuromuscular disorder (HCC), Renal disorder, Severe sepsis (HCC) (02/07/2020), Short-segment Barrett's esophagus, and Sleep apnea. Surgical: Kenneth Osborn  has a past surgical history that includes Cardiovascular stress test; Cardiac catheterization; Labrinthectomy;  mastoid shunt (Bilateral); Back surgery; Shoulder arthroscopy with subacromial decompression (Left, 04/06/2012); ARTHRODESIS ANTERIOR ANTERIOR CERVICLE SPINE (01/04/2013); Colonoscopy with propofol (N/A, 09/19/2014); Esophagogastroduodenoscopy (N/A, 09/19/2014); Savory dilation (N/A, 09/19/2014); Spinal cord stimulator implant (Right); Cardiac catheterization (N/A, 06/15/2015); Cardiac catheterization (N/A, 06/15/2015); Pulse generator implant (N/A, 01/18/2019); Lumbar spinal cord simulator lead removal (Right, 08/09/2019); IR Perc Cholecystostomy (02/09/2020); IR Radiologist Eval & Mgmt (03/14/2020); Coronary angioplasty; Appendectomy; Robot assisted laparoscopic nephrectomy (Left, 04/20/2020); and Colonoscopy with propofol (N/A, 04/03/2021). Family: family history includes Cancer in his sister; Diabetes in his maternal grandmother, mother, and paternal grandmother; Heart disease in his father, maternal aunt, maternal uncle, and mother.  Laboratory Chemistry Profile   Renal Lab Results  Component Value Date   BUN 37 (H) 11/02/2021   CREATININE 2.18 (H) 11/02/2021   GFRAA 58 (L) 01/08/2019   GFRNONAA 32 (L) 11/02/2021    Hepatic Lab Results  Component Value Date   AST 33 11/01/2021   ALT 31 11/01/2021   ALBUMIN 4.0 11/01/2021   ALKPHOS 79 11/01/2021   HCVAB NON REACTIVE 02/08/2020   LIPASE 28 12/11/2020   AMMONIA 24 02/07/2020    Electrolytes Lab Results  Component Value Date   NA 138 11/02/2021   K 5.9 (H) 11/02/2021   CL 107 11/02/2021   CALCIUM 9.8 11/02/2021   MG 1.6 (L) 11/02/2021   PHOS 3.1 11/09/2020    Bone No results found for: "VD25OH", "VD125OH2TOT", "ZO1096EA5", "WU9811BJ4", "25OHVITD1", "25OHVITD2", "25OHVITD3", "TESTOFREE", "TESTOSTERONE"  Inflammation (CRP: Acute Phase) (ESR: Chronic Phase) Lab Results  Component Value Date   CRP 1.4 (H) 03/22/2015   ESRSEDRATE 9 03/22/2015   LATICACIDVEN 1.5 12/11/2020         Note: Above Lab results reviewed.  Recent Imaging Review   MR KNEE LEFT WO CONTRAST CLINICAL DATA:  Knee pain, chronic, erosive osteoarthritis suspected, xray done Knee pain, chronic, degenerative disease on xray (Age >= 5y)  EXAM: MRI OF THE LEFT KNEE WITHOUT CONTRAST  TECHNIQUE: Multiplanar, multisequence MR imaging of the knee was performed. No intravenous contrast was administered.  COMPARISON:  None Available.  FINDINGS: MENISCI  Medial: Intact.  Lateral: Intact.  LIGAMENTS  Cruciates: ACL and PCL are intact.  Collaterals: Medial collateral ligament is intact. Lateral collateral ligament complex is intact.  CARTILAGE  Patellofemoral: Mild chondrosis along the inferior aspect of the patellar facets.  Medial:  No chondral defect.  Lateral:  No chondral defect.  JOINT: Trace joint effusion. Mild edema in the suprapatellar fat pad.  POPLITEAL FOSSA: Miniscule Baker's cyst.  EXTENSOR MECHANISM: Intact quadriceps tendon. Intact patellar tendon.  BONES: No evidence of acute fracture.  No aggressive osseous lesion.  Other: No focal fluid collection.  IMPRESSION: Mild patellofemoral chondrosis along the inferior aspect of the patellar facets.  Mild edema in the suprapatellar fat pad, can be  seen in suprapatellar impingement.  No evidence of meniscus tear or ligamentous injury.  Electronically Signed   By: Caprice Renshaw M.D.   On: 05/20/2022 16:38 Note: Reviewed        Physical Exam  General appearance: Well nourished, well developed, and well hydrated. In no apparent acute distress Mental status: Alert, oriented x 3 (person, place, & time)       Respiratory: No evidence of acute respiratory distress Eyes: PERLA Vitals: BP 126/81   Pulse 78   Temp (!) 97 F (36.1 C) (Temporal)   Resp 18   Ht 5\' 9"  (1.753 m)   Wt 225 lb (102.1 kg)   SpO2 100%   BMI 33.23 kg/m  BMI: Estimated body mass index is 33.23 kg/m as calculated from the following:   Height as of this encounter: 5\' 9"  (1.753 m).   Weight as of  this encounter: 225 lb (102.1 kg). Ideal: Ideal body weight: 70.7 kg (155 lb 13.8 oz) Adjusted ideal body weight: 83.2 kg (183 lb 8.3 oz)  Assessment   Diagnosis Status  1. Chronic pain syndrome   2. Cervical facet syndrome (Right)   3. 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 Carolyn Stare at [facility] on 01/04/2013)   4. Thoracic facet syndrome (T8-10)   5. Chronic upper back pain   6. Chronic musculoskeletal pain   7. Failed back surgical syndrome   8. Chronic lower extremity pain (Left)   9. Chronic upper extremity pain (Left)   10. Chronic low back pain (Bilateral) w/o sciatica   11. Pharmacologic therapy   12. Chronic use of opiate for therapeutic purpose   13. Encounter for medication management   14. Encounter for chronic pain management   15. Chronic anticoagulation (Effient)   16. Lumbar facet syndrome (Bilateral) (R>L)   17. Spondylosis without myelopathy or radiculopathy, lumbosacral region   18. Lumbar facet joint pain    Controlled Controlled Controlled   Updated Problems: Problem  Lumbar Facet Joint Pain    Plan of Care  Problem-specific:  No problem-specific Assessment & Plan notes found for this encounter.  Kenneth Osborn has a current medication list which includes the following long-term medication(s): cetirizine, cyclobenzaprine, esomeprazole, ezetimibe, gabapentin, humulin r u-500 kwikpen, metoprolol succinate, montelukast, niacin, oxycodone, [START ON 10/02/2022] oxycodone, and [START ON 11/01/2022] oxycodone.  Pharmacotherapy (Medications Ordered): Meds ordered this encounter  Medications   oxyCODONE (OXY IR/ROXICODONE) 5 MG immediate release tablet    Sig: Take 1 tablet (5 mg total) by mouth every 8 (eight) hours. Must last 30 days.    Dispense:  90 tablet    Refill:  0    DO NOT: delete (not duplicate); no partial-fill (will deny script to complete), no refill request (F/U required). DISPENSE: 1 day early if closed on fill date. WARN: No  CNS-depressants within 8 hrs of med.   oxyCODONE (OXY IR/ROXICODONE) 5 MG immediate release tablet    Sig: Take 1 tablet (5 mg total) by mouth every 8 (eight) hours. Must last 30 days.    Dispense:  90 tablet    Refill:  0    DO NOT: delete (not duplicate); no partial-fill (will deny script to complete), no refill request (F/U required). DISPENSE: 1 day early if closed on fill date. WARN: No CNS-depressants within 8 hrs of med.   oxyCODONE (OXY IR/ROXICODONE) 5 MG immediate release tablet    Sig: Take 1 tablet (5 mg total) by mouth every 8 (eight) hours. Must  last 30 days.    Dispense:  90 tablet    Refill:  0    DO NOT: delete (not duplicate); no partial-fill (will deny script to complete), no refill request (F/U required). DISPENSE: 1 day early if closed on fill date. WARN: No CNS-depressants within 8 hrs of med.   Orders:  Orders Placed This Encounter  Procedures   Radiofrequency,Lumbar    Standing Status:   Future    Standing Expiration Date:   12/03/2022    Scheduling Instructions:     Side(s): Left-sided     Level: L3-4, L4-5, and L5-S1 Facets (L2, L3, L4, L5, and S1 Medial Branch)     Sedation: With Sedation.     Scheduling Timeframe: As soon as pre-approved    Order Specific Question:   Where will this procedure be performed?    Answer:   [facility]   DG Lumbar Spine Complete W/Bend    Patient presents with axial pain with possible radicular component. Please assist [name] in identifying specific level(s) and laterality of any additional findings such as: 1. Facet (Zygapophyseal) joint DJD (Hypertrophy, space narrowing, subchondral sclerosis, and/or osteophyte formation) 2. DDD and/or IVDD (Loss of disc height, desiccation, gas patterns, osteophytes, endplate sclerosis, or "Black disc disease") 3. Pars defects 4. Spondylolisthesis, spondylosis, and/or spondyloarthropathies (include Degree/Grade of displacement in mm) (stability) 5. Vertebral body Fractures (acute/chronic)  (state percentage of collapse) 6. Demineralization (osteopenia/osteoporotic) 7. Bone pathology 8. Foraminal narrowing  9. Surgical changes    Standing Status:   Future    Standing Expiration Date:   10/03/2022    Scheduling Instructions:     Please make sure that the patient understands that this needs to be done as soon as possible. Never have the patient do the imaging "just before the next appointment". Inform patient that having the imaging done within the [facility] will expedite the availability of the results and will provide      imaging availability to the requesting physician. In addition inform the patient that the imaging order has an expiration date and will not be renewed if not done within the active period.    Order Specific Question:   Reason for Exam (SYMPTOM  OR DIAGNOSIS REQUIRED)    Answer:   Low back pain    Order Specific Question:   Preferred imaging location?    Answer:   [facility]    Order Specific Question:   Call Results- Best Contact Number?    Answer:   [phone] [facility])    Order Specific Question:   Radiology Contrast Protocol - do NOT remove file path    Answer:   [email or web address]    Order Specific Question:   Release to patient    Answer:   Immediate   ToxASSURE Select 13 (MW), Urine    Volume: 30 ml(s). Minimum 3 ml of urine is needed. Document temperature of fresh sample. Indications: Long term (current) use of opiate analgesic (H08.657)    Order Specific Question:   Release to patient    Answer:   Immediate   Nursing Instructions:    1). STAT: UDS required today. 2). Make sure to document all opioids and benzodiazepines taken, including time of last intake. 3). If order is entered on a procedure day, make sure sample is obtained before any medications are administered.   Blood Thinner Instructions to Nursing    Always make sure patient has clearance from prescribing physician to stop  blood thinners for interventional  therapies. If the patient requires a Lovenox-bridge therapy, make sure arrangements are made to institute it with the assistance of the PCP.    Scheduling Instructions:     Have Kenneth Osborn stop the Effient (Prasugrel) x 10 days prior to procedure or surgery.   Follow-up plan:   Return for (ECT): (L) L-FCT RFA #4, (Blood Thinner Protocol).      Interventional Therapies  Risk Factors  Considerations:   EFFIENT Anticoagulation: (Stop: 7-10 days  Re-start: 6 hrs)  Mnire's disease  OSA  GERD  CAD  (B) Carotid Artery Stenosis  s/p CABG  Hx. Cardiac Syncope  T2IDDM  Hx. MI (06/2010)  Unstable Angina  Lumbar SCS: implanted on 01/18/2019 and removed on 08/09/2019.   Planned  Pending:   Diagnostic/therapeutic left suprapatellar intra-articular steroid patellofemoral knee injection #1   Under consideration:   Diagnostic/therapeutic left suprapatellar intra-articular steroid patellofemoral knee injection #1 CYP2D6 / CYP3A4 genetic testing.   Completed:   Therapeutic right Racz procedure x1 (12/18/2021) (100/100/70/70-100)  Diagnostic/therapeutic right caudal ESI x1 (11/13/2021)  Palliative right PSIS MNB/TPI x3 (11/13/2017) (100/100/100)  Diagnostic/palliative right lumbar facet MBB x2 (09/22/2018) (100/100/75/75)  Diagnostic/palliative left lumbar facet MBB x2 (09/22/2018) (100/100/75/75)  Palliative left lumbar facet RFA x3 (02/20/2021) (85/85/55/60-70)  Palliative right lumbar facet RFA x3 (01/09/2021) (100/95/50/60-70)    Therapeutic  Palliative (PRN) options:   Palliative right PSIS MNB/TPI  Diagnostic/palliative lumbar facet MBB  Palliative lumbar facet RFA    Pharmacotherapy  Nonopioids transferred 11/22/2019: Magnesium, Flexeril, and Neurontin. Last "Drug Holiday": See 05/06/2022 note.  (To be completed on 05/27/2022)       Recent Visits Date Type Provider Dept  06/27/22 Office Visit Delano Metz, MD [facility]  06/04/22  Procedure visit Delano Metz, MD [facility]  Showing recent visits within past 90 days and meeting all other requirements Today's Visits Date Type Provider Dept  09/02/22 Office Visit Delano Metz, MD [facility]  Showing today's visits and meeting all other requirements Future Appointments Date Type Provider Dept  09/17/22 Appointment Delano Metz, MD [facility]  11/25/22 Appointment Delano Metz, MD [facility]  Showing future appointments within next 90 days and meeting all other requirements  I discussed the assessment and treatment plan with the patient. The patient was provided an opportunity to ask questions and all were answered. The patient agreed with the plan and demonstrated an understanding of the instructions.  Patient advised to call back or seek an in-person evaluation if the symptoms or condition worsens.  Duration of encounter: 30 minutes.  Total time on encounter, as per AMA guidelines included both the face-to-face and non-face-to-face time personally spent by the physician and/or other qualified health care professional(s) on the day of the encounter (includes time in activities that require the physician or other qualified health care professional and does not include time in activities normally performed by clinical staff). Physician's time may include the following activities when performed: Preparing to see the patient (e.g., pre-charting review of records, searching for previously ordered imaging, lab work, and nerve conduction tests) Review of prior analgesic pharmacotherapies. Reviewing PMP Interpreting ordered tests (e.g., lab work, imaging, nerve conduction tests) Performing post-procedure evaluations, including interpretation of diagnostic procedures Obtaining and/or reviewing separately obtained history Performing a medically appropriate examination and/or evaluation Counseling and educating the  patient/family/caregiver Ordering medications, tests, or procedures Referring and communicating with other health care professionals (when not separately reported) Documenting clinical information in the electronic or other health record  Independently interpreting results (not separately reported) and communicating results to the patient/ family/caregiver Care coordination (not separately reported)  Note by: Oswaldo Done, MD Date: 09/02/2022; Time: 8:51 AM

## 2022-09-01 NOTE — Patient Instructions (Addendum)
Radiofrequency Ablation Radiofrequency ablation is a procedure that is performed to relieve pain. The procedure is often used for back, neck, or arm pain. Radiofrequency ablation involves the use of a machine that creates radio waves to make heat. During the procedure, the heat is applied to the nerve that carries the pain signal. The heat damages the nerve and interferes with the pain signal. Pain relief usually starts about 2 weeks after the procedure and lasts for 6 months to 1 year. Tell a health care provider about: Any allergies you have. All medicines you are taking, including vitamins, herbs, eye drops, creams, and over-the-counter medicines. Any problems you or family members have had with anesthetic medicines. Any bleeding problems you have. Any surgeries you have had. Any medical conditions you have. Whether you are pregnant or may be pregnant. What are the risks? Generally, this is a safe procedure. However, problems may occur, including: Pain or soreness at the injection site. Allergic reaction to medicines given during the procedure. Bleeding. Infection at the injection site. Damage to nerves or blood vessels. What happens before the procedure? When to stop eating and drinking Follow instructions from your health care provider about what you may eat and drink before your procedure. These may include: 8 hours before the procedure Stop eating most foods. Do not eat meat, fried foods, or fatty foods. Eat only light foods, such as toast or crackers. All liquids are okay except energy drinks and alcohol. 6 hours before the procedure Stop eating. Drink only clear liquids, such as water, clear fruit juice, black coffee, plain tea, and sports drinks. Do not drink energy drinks or alcohol. 2 hours before the procedure Stop drinking all liquids. You may be allowed to take medicine with small sips of water. If you do not follow your health care provider's instructions, your  procedure may be delayed or canceled. Medicines Ask your health care provider about: Changing or stopping your regular medicines. This is especially important if you are taking diabetes medicines or blood thinners. Taking medicines such as aspirin and ibuprofen. These medicines can thin your blood. Do not take these medicines unless your health care provider tells you to take them. Taking over-the-counter medicines, vitamins, herbs, and supplements. General instructions Ask your health care provider what steps will be taken to help prevent infection. These steps may include: Removing hair at the procedure site. Washing skin with a germ-killing soap. Taking antibiotic medicine. If you will be going home right after the procedure, plan to have a responsible adult: Take you home from the hospital or clinic. You will not be allowed to drive. Care for you for the time you are told. What happens during the procedure?  You will be awake during the procedure. You will need to be able to talk with the health care provider during the procedure. An IV will be inserted into one of your veins. You will be given one or more of the following: A medicine to help you relax (sedative). A medicine to numb the area (local anesthetic). Your health care provider will insert a radiofrequency needle into the area to be treated. This is done with the help of fluoroscopy. A wire that carries the radio waves (electrode) will be put through the radiofrequency needle. An electrical pulse will be sent through the electrode to verify the correct nerve that is causing your pain. You will feel a tingling sensation, and you may have muscle twitching. The tissue around the needle tip will be heated by an  electric current that comes from the radiofrequency machine. This will numb the nerves. The needle will be removed. A bandage (dressing) will be put on the insertion area. The procedure may vary among health care providers  and hospitals. What happens after the procedure? Your blood pressure, heart rate, breathing rate, and blood oxygen level will be monitored until you leave the hospital or clinic. Return to your normal activities as told by your health care provider. Ask your health care provider what activities are safe for you. If you were given a sedative during the procedure, it can affect you for several hours. Do not drive or operate machinery until your health care provider says that it is safe. Summary Radiofrequency ablation is a procedure that is performed to relieve pain. The procedure is often used for back, neck, or arm pain. Radiofrequency ablation involves the use of a machine that creates radio waves to make heat. Plan to have a responsible adult take you home from the hospital or clinic. Do not drive or operate machinery until your health care provider says that it is safe. Return to your normal activities as told by your health care provider. Ask your health care provider what activities are safe for you. This information is not intended to replace advice given to you by your health care provider. Make sure you discuss any questions you have with your health care provider. Document Revised: 07/25/2020 Document Reviewed: 07/25/2020 Elsevier Patient Education  2024 Elsevier Inc.  ____________________________________________________________________________________________  Opioid Pain Medication Update  To: All patients taking opioid pain medications. (I.e.: hydrocodone, hydromorphone, oxycodone, oxymorphone, morphine, codeine, methadone, tapentadol, tramadol, buprenorphine, fentanyl, etc.)  Re: Updated review of side effects and adverse reactions of opioid analgesics, as well as new information about long term effects of this class of medications.  Direct risks of long-term opioid therapy are not limited to opioid addiction and overdose. Potential medical risks include serious fractures,  breathing problems during sleep, hyperalgesia, immunosuppression, chronic constipation, bowel obstruction, myocardial infarction, and tooth decay secondary to xerostomia.  Unpredictable adverse effects that can occur even if you take your medication correctly: Cognitive impairment, respiratory depression, and death. Most people think that if they take their medication "correctly", and "as instructed", that they will be safe. Nothing could be farther from the truth. In reality, a significant amount of recorded deaths associated with the use of opioids has occurred in individuals that had taken the medication for a long time, and were taking their medication correctly. The following are examples of how this can happen: Patient taking his/her medication for a long time, as instructed, without any side effects, is given a certain antibiotic or another unrelated medication, which in turn triggers a "Drug-to-drug interaction" leading to disorientation, cognitive impairment, impaired reflexes, respiratory depression or an untoward event leading to serious bodily harm or injury, including death.  Patient taking his/her medication for a long time, as instructed, without any side effects, develops an acute impairment of liver and/or kidney function. This will lead to a rapid inability of the body to breakdown and eliminate their pain medication, which will result in effects similar to an "overdose", but with the same medicine and dose that they had always taken. This again may lead to disorientation, cognitive impairment, impaired reflexes, respiratory depression or an untoward event leading to serious bodily harm or injury, including death.  A similar problem will occur with patients as they grow older and their liver and kidney function begins to decrease as part of the aging  process.  Background information: Historically, the original case for using long-term opioid therapy to treat chronic noncancer pain was  based on safety assumptions that subsequent experience has called into question. In 1996, the American Pain Society and the American Academy of Pain Medicine issued a consensus statement supporting long-term opioid therapy. This statement acknowledged the dangers of opioid prescribing but concluded that the risk for addiction was low; respiratory depression induced by opioids was short-lived, occurred mainly in opioid-naive patients, and was antagonized by pain; tolerance was not a common problem; and efforts to control diversion should not constrain opioid prescribing. This has now proven to be wrong. Experience regarding the risks for opioid addiction, misuse, and overdose in community practice has failed to support these assumptions.  According to the Centers for Disease Control and Prevention, fatal overdoses involving opioid analgesics have increased sharply over the past decade. Currently, more than 96,700 people die from drug overdoses every year. Opioids are a factor in 7 out of every 10 overdose deaths. Deaths from drug overdose have surpassed motor vehicle accidents as the leading cause of death for individuals between the ages of 9 and 64.  Clinical data suggest that neuroendocrine dysfunction may be very common in both men and women, potentially causing hypogonadism, erectile dysfunction, infertility, decreased libido, osteoporosis, and depression. Recent studies linked higher opioid dose to increased opioid-related mortality. Controlled observational studies reported that long-term opioid therapy may be associated with increased risk for cardiovascular events. Subsequent meta-analysis concluded that the safety of long-term opioid therapy in elderly patients has not been proven.   Side Effects and adverse reactions: Common side effects: Drowsiness (sedation). Dizziness. Nausea and vomiting. Constipation. Physical dependence -- Dependence often manifests with withdrawal symptoms when opioids  are discontinued or decreased. Tolerance -- As you take repeated doses of opioids, you require increased medication to experience the same effect of pain relief. Respiratory depression -- This can occur in healthy people, especially with higher doses. However, people with COPD, asthma or other lung conditions may be even more susceptible to fatal respiratory impairment.  Uncommon side effects: An increased sensitivity to feeling pain and extreme response to pain (hyperalgesia). Chronic use of opioids can lead to this. Delayed gastric emptying (the process by which the contents of your stomach are moved into your small intestine). Muscle rigidity. Immune system and hormonal dysfunction. Quick, involuntary muscle jerks (myoclonus). Arrhythmia. Itchy skin (pruritus). Dry mouth (xerostomia).  Long-term side effects: Chronic constipation. Sleep-disordered breathing (SDB). Increased risk of bone fractures. Hypothalamic-pituitary-adrenal dysregulation. Increased risk of overdose.  RISKS: Respiratory depression and death: Opioids increase the risk of respiratory depression and death.  Drug-to-drug interactions: Opioids are relatively contraindicated in combination with benzodiazepines, sleep inducers, and other central nervous system depressants. Other classes of medications (i.e.: certain antibiotics and even over-the-counter medications) may also trigger or induce respiratory depression in some patients.  Medical conditions: Patients with pre-existing respiratory problems are at higher risk of respiratory failure and/or depression when in combination with opioid analgesics. Opioids are relatively contraindicated in some medical conditions such as central sleep apnea.   Fractures and Falls:  Opioids increase the risk and incidence of falls. This is of particular importance in elderly patients.  Endocrine System:  Long-term administration is associated with endocrine abnormalities  (endocrinopathies). (Also known as Opioid-induced Endocrinopathy) Influences on both the hypothalamic-pituitary-adrenal axis?and the hypothalamic-pituitary-gonadal axis have been demonstrated with consequent hypogonadism and adrenal insufficiency in both sexes. Hypogonadism and decreased levels of dehydroepiandrosterone sulfate have been reported in men and women.  Endocrine effects include: Amenorrhoea in women (abnormal absence of menstruation) Reduced libido in both sexes Decreased sexual function Erectile dysfunction in men Hypogonadisms (decreased testicular function with shrinkage of testicles) Infertility Depression and fatigue Loss of muscle mass Anxiety Depression Immune suppression Hyperalgesia Weight gain Anemia Osteoporosis Patients (particularly women of childbearing age) should avoid opioids. There is insufficient evidence to recommend routine monitoring of asymptomatic patients taking opioids in the long-term for hormonal deficiencies.  Immune System: Human studies have demonstrated that opioids have an immunomodulating effect. These effects are mediated via opioid receptors both on immune effector cells and in the central nervous system. Opioids have been demonstrated to have adverse effects on antimicrobial response and anti-tumour surveillance. Buprenorphine has been demonstrated to have no impact on immune function.  Opioid Induced Hyperalgesia: Human studies have demonstrated that prolonged use of opioids can lead to a state of abnormal pain sensitivity, sometimes called opioid induced hyperalgesia (OIH). Opioid induced hyperalgesia is not usually seen in the absence of tolerance to opioid analgesia. Clinically, hyperalgesia may be diagnosed if the patient on long-term opioid therapy presents with increased pain. This might be qualitatively and anatomically distinct from pain related to disease progression or to breakthrough pain resulting from development of opioid  tolerance. Pain associated with hyperalgesia tends to be more diffuse than the pre-existing pain and less defined in quality. Management of opioid induced hyperalgesia requires opioid dose reduction.  Cancer: Chronic opioid therapy has been associated with an increased risk of cancer among noncancer patients with chronic pain. This association was more evident in chronic strong opioid users. Chronic opioid consumption causes significant pathological changes in the small intestine and colon. Epidemiological studies have found that there is a link between opium dependence and initiation of gastrointestinal cancers. Cancer is the second leading cause of death after cardiovascular disease. Chronic use of opioids can cause multiple conditions such as GERD, immunosuppression and renal damage as well as carcinogenic effects, which are associated with the incidence of cancers.   Mortality: Long-term opioid use has been associated with increased mortality among patients with chronic non-cancer pain (CNCP).  Prescription of long-acting opioids for chronic noncancer pain was associated with a significantly increased risk of all-cause mortality, including deaths from causes other than overdose.  Reference: Von Korff M, Kolodny A, Deyo RA, Chou R. Long-term opioid therapy reconsidered. Ann Intern Med. 2011 Sep 6;155(5):325-8. doi: 10.7326/[id number]. PMID: 16109604; PMCID: [id number]. Randon Goldsmith, Hayward RA, Dunn KM, [place] KP. Risk of adverse events in patients prescribed long-term opioids: A cohort study in the [place] [facility] [employer]. Eur J Pain. 2019 May;23(5):908-922. doi: 10.1002/ejp.1357. Epub 2019 Jan 31. PMID: 78295621. Colameco S, Coren JS, Ciervo CA. Continuous opioid treatment for chronic noncancer pain: a time for moderation in prescribing. Postgrad Med. 2009 Jul;121(4):61-6. doi: 10.3810/pgm.2009.07.2032. PMID: 30865784. William Hamburger RN, [place], Blazina I, Cristopher [place], Bougatsos C, Deyo RA. The effectiveness and risks of long-term opioid therapy for chronic pain: a systematic review for a [employer] to [employer]. Ann Intern Med. 2015 Feb 17;162(4):276-86. doi: 10.7326/M14-2559. PMID: 69629528. Caryl Bis [facility], [place] DM. NCHS Data Brief No. 22. [place]: Centers for Disease Control and Prevention; 2009. Sep, Increase in Fatal Poisonings Involving Opioid Analgesics in the [place], 1999-2006. Song IA, Choi HR, Oh TK. Long-term opioid use and mortality in patients with chronic non-cancer pain: Ten-year follow-up study in [place] from 2010 through 2019. EClinicalMedicine. 2022 Jul 18;51:101558. doi:  10.1016/j.eclinm.2022.161096. PMID: 04540981; PMCID: [id number]. Huser, W., Schubert, T., Vogelmann, T. et al. All-cause mortality in patients with long-term opioid therapy compared with non-opioid analgesics for chronic non-cancer pain: a database study. BMC Med 18, 162 (2020). http://lester.info/ Rashidian H, Karie Kirks, Malekzadeh R, Haghdoost AA. An Ecological Study of the Association between Opiate Use and Incidence of Cancers. Addict Health. 2016 Fall;8(4):252-260. PMID: 21308657; PMCID: [id number].  Our Goal: Our goal is to control your pain with means other than the use of opioid pain medications.  Our Recommendation: Talk to your physician about coming off of these medications. We can assist you with the tapering down and stopping these medicines. Based on the new information, even if you cannot completely stop the medication, a decrease in the dose may be associated with a lesser risk. Ask for other means of controlling the pain. Decrease or eliminate those factors that significantly contribute to your pain such as smoking, obesity, and a diet heavily tilted towards "inflammatory" nutrients.  Last Updated: 08/26/2022    ____________________________________________________________________________________________     ____________________________________________________________________________________________  Transfer of Pain Medication between Pharmacies  Re: 2023 DEA Clarification on existing regulation  Published on DEA Website: October 19, 2021  Title: Revised Regulation Allows DEA-Registered Pharmacies to Electrical engineer Prescriptions at a Patient's Request DEA Headquarters Division - [employer]  "Patients now have the ability to request their electronic prescription be transferred to another pharmacy without having to go back to their practitioner to initiate the request. This revised regulation went into effect on Monday, October 15, 2021.     At a patient's request, a DEA-registered retail pharmacy can now transfer an electronic prescription for a controlled substance (schedules II-V) to another DEA-registered retail pharmacy. Prior to this change, patients would have to go through their practitioner to cancel their prescription and have it re-issued to a different pharmacy. The process was taxing and time consuming for both patients and practitioners.    The Drug Enforcement Administration [facility]) published its intent to revise the process for transferring electronic prescriptions on January 07, 2020.  The final rule was published in the federal register on September 13, 2021 and went into effect 30 days later.  Under the final rule, a prescription can only be transferred once between pharmacies, and only if allowed under existing state or other applicable law. The prescription must remain in its electronic form; may not be altered in any way; and the transfer must be communicated directly between two licensed pharmacists. It's important to note, any authorized refills transfer with the original prescription, which means the entire prescription will be filled at the same pharmacy."     REFERENCES: 1. DEA website announcement HugeHand.is  2. Department of Justice website  CheapWipes.at.pdf  3. DEPARTMENT OF JUSTICE Drug Enforcement Administration 21 CFR Part 1306 [Docket No. [id number]] RIN [id number] "Transfer of Electronic Prescriptions for Schedules II-V Controlled Substances Between Pharmacies for Initial Filling"  ____________________________________________________________________________________________     _______________________________________________________________________  Medication Rules  Purpose: To inform patients, and their family members, of our medication rules and regulations.  Applies to: All patients receiving prescriptions from our practice (written or electronic).  Pharmacy of record: This is the pharmacy where your electronic prescriptions will be sent. Make sure we have the correct one.  Electronic prescriptions: In compliance with the [facility] Strengthen Opioid Misuse Prevention (STOP) Act of 2017 (Session Conni Elliot [phone]), effective February 18, 2018, all controlled substances must be electronically prescribed. Written prescriptions, faxing, or calling prescriptions to a pharmacy  will no longer be done.  Prescription refills: These will be provided only during in-person appointments. No medications will be renewed without a "face-to-face" evaluation with your provider. Applies to all prescriptions.  NOTE: The following applies primarily to controlled substances (Opioid* Pain Medications).   Type of encounter (visit): For patients receiving controlled substances, face-to-face visits are required. (Not an option and not up to the patient.)  Patient's responsibilities: Pain Pills: Bring all pain pills to every appointment (except for procedure appointments). Pill Bottles: Bring  pills in original pharmacy bottle. Bring bottle, even if empty. Always bring the bottle of the most recent fill.  Medication refills: You are responsible for knowing and keeping track of what medications you are taking and when is it that you will need a refill. The day before your appointment: write a list of all prescriptions that need to be refilled. The day of the appointment: give the list to the admitting nurse. Prescriptions will be written only during appointments. No prescriptions will be written on procedure days. If you forget a medication: it will not be "Called in", "Faxed", or "electronically sent". You will need to get another appointment to get these prescribed. No early refills. Do not call asking to have your prescription filled early. Partial  or short prescriptions: Occasionally your pharmacy may not have enough pills to fill your prescription.  NEVER ACCEPT a partial fill or a prescription that is short of the total amount of pills that you were prescribed.  With controlled substances the law allows 72 hours for the pharmacy to complete the prescription.  If the prescription is not completed within 72 hours, the pharmacist will require a new prescription to be written. This means that you will be short on your medicine and we WILL NOT send another prescription to complete your original prescription.  Instead, request the pharmacy to send a carrier to a nearby branch to get enough medication to provide you with your full prescription. Prescription Accuracy: You are responsible for carefully inspecting your prescriptions before leaving our office. Have the discharge nurse carefully go over each prescription with you, before taking them home. Make sure that your name is accurately spelled, that your address is correct. Check the name and dose of your medication to make sure it is accurate. Check the number of pills, and the written instructions to make sure they are clear and accurate. Make  sure that you are given enough medication to last until your next medication refill appointment. Taking Medication: Take medication as prescribed. When it comes to controlled substances, taking less pills or less frequently than prescribed is permitted and encouraged. Never take more pills than instructed. Never take the medication more frequently than prescribed.  Inform other Doctors: Always inform, all of your healthcare providers, of all the medications you take. Pain Medication from other Providers: You are not allowed to accept any additional pain medication from any other Doctor or Healthcare provider. There are two exceptions to this rule. (see below) In the event that you require additional pain medication, you are responsible for notifying us, as stated below. Cough Medicine: Often these contain an opioid, such as codeine or hydrocodone. Never accept or take cough medicine containing these opioids if you are already taking an opioid* medication. The combination may cause respiratory failure and death. Medication Agreement: You are responsible for carefully reading and following our Medication Agreement. This must be signed before receiving any prescriptions from our practice. Safely store a copy of your signed Agreement.  Violations to the Agreement will result in no further prescriptions. (Additional copies of our Medication Agreement are available upon request.) Laws, Rules, & Regulations: All patients are expected to follow all [address] and [employer], Rules, [place] [employer]. Ignorance of the Laws does not constitute a valid excuse.  Illegal drugs and Controlled Substances: The use of illegal substances (including, but not limited to marijuana and its derivatives) and/or the illegal use of any controlled substances is strictly prohibited. Violation of this rule may result in the immediate and permanent discontinuation of any and all prescriptions being written by our practice. The use of  any illegal substances is prohibited. Adopted CDC guidelines & recommendations: Target dosing levels will be at or below 60 MME/day. Use of benzodiazepines** is not recommended.  Exceptions: There are only two exceptions to the rule of not receiving pain medications from other Healthcare Providers. Exception #1 (Emergencies): In the event of an emergency (i.e.: accident requiring emergency care), you are allowed to receive additional pain medication. However, you are responsible for: As soon as you are able, call our office [phone], at any time of the day or night, and leave a message stating your name, the date and nature of the emergency, and the name and dose of the medication prescribed. In the event that your call is answered by a member of our staff, make sure to document and save the date, time, and the name of the person that took your information.  Exception #2 (Planned Surgery): In the event that you are scheduled by another doctor or dentist to have any type of surgery or procedure, you are allowed (for a period no longer than 30 days), to receive additional pain medication, for the acute post-op pain. However, in this case, you are responsible for picking up a copy of our "Post-op Pain Management for Surgeons" handout, and giving it to your surgeon or dentist. This document is available at our office, and does not require an appointment to obtain it. Simply go to our office during business hours (Monday-Thursday from 8:00 AM to 4:00 PM) (Friday 8:00 AM to 12:00 Noon) or if you have a scheduled appointment with [name], prior to your surgery, and ask for it by name. In addition, you are responsible for: calling our office [phone], at any time of the day or night, and leaving a message stating your name, name of your surgeon, type of surgery, and date of procedure or surgery. Failure to comply with your responsibilities may result in termination of therapy involving the controlled  substances. Medication Agreement Violation. Following the above rules, including your responsibilities will help you in avoiding a Medication Agreement Violation ("Breaking your Pain Medication Contract").  Consequences:  Not following the above rules may result in permanent discontinuation of medication prescription therapy.  *Opioid medications include: morphine, codeine, oxycodone, oxymorphone, hydrocodone, hydromorphone, meperidine, tramadol, tapentadol, buprenorphine, fentanyl, methadone. **Benzodiazepine medications include: diazepam (Valium), alprazolam (Xanax), clonazepam (Klonopine), lorazepam (Ativan), clorazepate (Tranxene), chlordiazepoxide (Librium), estazolam (Prosom), oxazepam (Serax), temazepam (Restoril), triazolam (Halcion) (Last updated: 12/11/2021) ______________________________________________________________________    ______________________________________________________________________  Medication Recommendations and Reminders  Applies to: All patients receiving prescriptions (written and/or electronic).  Medication Rules & Regulations: You are responsible for reading, knowing, and following our "Medication Rules" document. These exist for your safety and that of others. They are not flexible and neither are we. Dismissing or ignoring them is an act of "non-compliance" that may result in complete and irreversible termination of such medication therapy. For safety reasons, "  non-compliance" will not be tolerated. As with the [place] fundamental legal principle of "ignorance of the law is no defense", we will accept no excuses for not having read and knowing the content of documents provided to you by our practice.  Pharmacy of record:  Definition: This is the pharmacy where your electronic prescriptions will be sent.  We do not endorse any particular pharmacy. It is up to you and your insurance to decide what pharmacy to use.  We do not restrict you in your choice of  pharmacy. However, once we write for your prescriptions, we will NOT be re-sending more prescriptions to fix restricted supply problems created by your pharmacy, or your insurance.  The pharmacy listed in the electronic medical record should be the one where you want electronic prescriptions to be sent. If you choose to change pharmacy, simply notify our nursing staff. Changes will be made only during your regular appointments and not over the phone.  Recommendations: Keep all of your pain medications in a safe place, under lock and key, even if you live alone. We will NOT replace lost, stolen, or damaged medication. We do not accept "Police Reports" as proof of medications having been stolen. After you fill your prescription, take 1 week's worth of pills and put them away in a safe place. You should keep a separate, properly labeled bottle for this purpose. The remainder should be kept in the original bottle. Use this as your primary supply, until it runs out. Once it's gone, then you know that you have 1 week's worth of medicine, and it is time to come in for a prescription refill. If you do this correctly, it is unlikely that you will ever run out of medicine. To make sure that the above recommendation works, it is very important that you make sure your medication refill appointments are scheduled at least 1 week before you run out of medicine. To do this in an effective manner, make sure that you do not leave the office without scheduling your next medication management appointment. Always ask the nursing staff to show you in your prescription , when your medication will be running out. Then arrange for the receptionist to get you a return appointment, at least 7 days before you run out of medicine. Do not wait until you have 1 or 2 pills left, to come in. This is very poor planning and does not take into consideration that we may need to cancel appointments due to bad weather, sickness, or emergencies  affecting our staff. DO NOT ACCEPT A "Partial Fill": If for any reason your pharmacy does not have enough pills/tablets to completely fill or refill your prescription, do not allow for a "partial fill". The law allows the pharmacy to complete that prescription within 72 hours, without requiring a new prescription. If they do not fill the rest of your prescription within those 72 hours, you will need a separate prescription to fill the remaining amount, which we will NOT provide. If the reason for the partial fill is your insurance, you will need to talk to the pharmacist about payment alternatives for the remaining tablets, but again, DO NOT ACCEPT A PARTIAL FILL, unless you can trust your pharmacist to obtain the remainder of the pills within 72 hours.  Prescription refills and/or changes in medication(s):  Prescription refills, and/or changes in dose or medication, will be conducted only during scheduled medication management appointments. (Applies to both, written and electronic prescriptions.) No refills on procedure days. No  medication will be changed or started on procedure days. No changes, adjustments, and/or refills will be conducted on a procedure day. Doing so will interfere with the diagnostic portion of the procedure. No phone refills. No medications will be "called into the pharmacy". No Fax refills. No weekend refills. No Holliday refills. No after hours refills.  Remember:  Business hours are:  Monday to Thursday 8:00 AM to 4:00 PM Provider's Schedule: Delano Metz, MD - Appointments are:  Medication management: Monday and Wednesday 8:00 AM to 4:00 PM Procedure day: Tuesday and Thursday 7:30 AM to 4:00 PM Edward Jolly, MD - Appointments are:  Medication management: Tuesday and Thursday 8:00 AM to 4:00 PM Procedure day: Monday and Wednesday 7:30 AM to 4:00 PM (Last update: 12/11/2021) ______________________________________________________________________    ____________________________________________________________________________________________  Naloxone Nasal Spray  Why am I receiving this medication? [place] STOP ACT requires that all patients taking high dose opioids or at risk of opioids respiratory depression, be prescribed an opioid reversal agent, such as Naloxone (AKA: Narcan).  What is this medication? NALOXONE (nal OX one) treats opioid overdose, which causes slow or shallow breathing, severe drowsiness, or trouble staying awake. Call emergency services after using this medication. You may need additional treatment. Naloxone works by reversing the effects of opioids. It belongs to a group of medications called opioid blockers.  COMMON BRAND NAME(S): Kloxxado, Narcan  What should I tell my care team before I take this medication? They need to know if you have any of these conditions: Heart disease Substance use disorder An unusual or allergic reaction to naloxone, other medications, foods, dyes, or preservatives Pregnant or trying to get pregnant Breast-feeding  When to use this medication? This medication is to be used for the treatment of respiratory depression (less than 8 breaths per minute) secondary to opioid overdose.   How to use this medication? This medication is for use in the nose. Lay the person on their back. Support their neck with your hand and allow the head to tilt back before giving the medication. The nasal spray should be given into 1 nostril. After giving the medication, move the person onto their side. Do not remove or test the nasal spray until ready to use. Get emergency medical help right away after giving the first dose of this medication, even if the person wakes up. You should be familiar with how to recognize the signs and symptoms of a narcotic overdose. If more doses are needed, give the additional dose in the other nostril. Talk to your care team about the use of this medication in children.  While this medication may be prescribed for children as young as newborns for selected conditions, precautions do apply.  Naloxone Overdosage: If you think you have taken too much of this medicine contact a poison control center or emergency room at once.  NOTE: This medicine is only for you. Do not share this medicine with others.  What if I miss a dose? This does not apply.  What may interact with this medication? This is only used during an emergency. No interactions are expected during emergency use. This list may not describe all possible interactions. Give your health care provider a list of all the medicines, herbs, non-prescription drugs, or dietary supplements you use. Also tell them if you smoke, drink alcohol, or use illegal drugs. Some items may interact with your medicine.  What should I watch for while using this medication? Keep this medication ready for use in the case of  an opioid overdose. Make sure that you have the phone number of your care team and local hospital ready. You may need to have additional doses of this medication. Each nasal spray contains a single dose. Some emergencies may require additional doses. After use, bring the treated person to the nearest hospital or call 911. Make sure the treating care team knows that the person has received a dose of this medication. You will receive additional instructions on what to do during and after use of this medication before an emergency occurs.  What side effects may I notice from receiving this medication? Side effects that you should report to your care team as soon as possible: Allergic reactions--skin rash, itching, hives, swelling of the face, lips, tongue, or throat Side effects that usually do not require medical attention (report these to your care team if they continue or are bothersome): Constipation Dryness or irritation inside the nose Headache Increase in blood pressure Muscle spasms Stuffy  nose Toothache This list may not describe all possible side effects. Call your doctor for medical advice about side effects. You may report side effects to FDA at 1-800-FDA-1088.  Where should I keep my medication? Because this is an emergency medication, you should keep it with you at all times.  Keep out of the reach of children and pets. Store between 20 and 25 degrees C (68 and 77 degrees F). Do not freeze. Throw away any unused medication after the expiration date. Keep in original box until ready to use.  NOTE: This sheet is a summary. It may not cover all possible information. If you have questions about this medicine, talk to your doctor, pharmacist, or health care provider.   2023 Elsevier/Gold Standard (2020-10-13 00:00:00)  ____________________________________________________________________________________________

## 2022-09-02 ENCOUNTER — Encounter: Payer: Self-pay | Admitting: Pain Medicine

## 2022-09-02 ENCOUNTER — Ambulatory Visit: Payer: Medicare Other | Attending: Pain Medicine | Admitting: Pain Medicine

## 2022-09-02 VITALS — BP 126/81 | HR 78 | Temp 97.0°F | Resp 18 | Ht 69.0 in | Wt 225.0 lb

## 2022-09-02 DIAGNOSIS — M79605 Pain in left leg: Secondary | ICD-10-CM | POA: Insufficient documentation

## 2022-09-02 DIAGNOSIS — Z7901 Long term (current) use of anticoagulants: Secondary | ICD-10-CM | POA: Insufficient documentation

## 2022-09-02 DIAGNOSIS — M47894 Other spondylosis, thoracic region: Secondary | ICD-10-CM | POA: Diagnosis present

## 2022-09-02 DIAGNOSIS — M549 Dorsalgia, unspecified: Secondary | ICD-10-CM | POA: Diagnosis present

## 2022-09-02 DIAGNOSIS — Z79899 Other long term (current) drug therapy: Secondary | ICD-10-CM | POA: Diagnosis present

## 2022-09-02 DIAGNOSIS — M79602 Pain in left arm: Secondary | ICD-10-CM | POA: Diagnosis present

## 2022-09-02 DIAGNOSIS — G8929 Other chronic pain: Secondary | ICD-10-CM | POA: Diagnosis present

## 2022-09-02 DIAGNOSIS — M545 Low back pain, unspecified: Secondary | ICD-10-CM | POA: Diagnosis present

## 2022-09-02 DIAGNOSIS — Z79891 Long term (current) use of opiate analgesic: Secondary | ICD-10-CM | POA: Diagnosis present

## 2022-09-02 DIAGNOSIS — M47816 Spondylosis without myelopathy or radiculopathy, lumbar region: Secondary | ICD-10-CM | POA: Diagnosis present

## 2022-09-02 DIAGNOSIS — M47817 Spondylosis without myelopathy or radiculopathy, lumbosacral region: Secondary | ICD-10-CM | POA: Insufficient documentation

## 2022-09-02 DIAGNOSIS — M7918 Myalgia, other site: Secondary | ICD-10-CM | POA: Diagnosis present

## 2022-09-02 DIAGNOSIS — M5459 Other low back pain: Secondary | ICD-10-CM | POA: Insufficient documentation

## 2022-09-02 DIAGNOSIS — M961 Postlaminectomy syndrome, not elsewhere classified: Secondary | ICD-10-CM | POA: Diagnosis present

## 2022-09-02 DIAGNOSIS — M47812 Spondylosis without myelopathy or radiculopathy, cervical region: Secondary | ICD-10-CM | POA: Insufficient documentation

## 2022-09-02 DIAGNOSIS — G894 Chronic pain syndrome: Secondary | ICD-10-CM | POA: Insufficient documentation

## 2022-09-02 MED ORDER — OXYCODONE HCL 5 MG PO TABS
5.0000 mg | ORAL_TABLET | Freq: Three times a day (TID) | ORAL | 0 refills | Status: DC
Start: 2022-09-02 — End: 2022-11-24

## 2022-09-02 MED ORDER — OXYCODONE HCL 5 MG PO TABS
5.0000 mg | ORAL_TABLET | Freq: Three times a day (TID) | ORAL | 0 refills | Status: DC
Start: 2022-11-01 — End: 2022-11-24

## 2022-09-02 MED ORDER — OXYCODONE HCL 5 MG PO TABS
5.0000 mg | ORAL_TABLET | Freq: Three times a day (TID) | ORAL | 0 refills | Status: DC
Start: 2022-10-02 — End: 2022-11-24

## 2022-09-02 NOTE — Progress Notes (Signed)
Nursing Pain Medication Assessment:  Safety precautions to be maintained throughout the outpatient stay will include: orient to surroundings, keep bed in low position, maintain call bell within reach at all times, provide assistance with transfer out of bed and ambulation.  Medication Inspection Compliance: Pill count conducted under aseptic conditions, in front of the patient. Neither the pills nor the bottle was removed from the patient's sight at any time. Once count was completed pills were immediately returned to the patient in their original bottle.  Medication: Oxycodone IR Pill/Patch Count:  2 of 90 pills remain Pill/Patch Appearance: Markings consistent with prescribed medication Bottle Appearance: Standard pharmacy container. Clearly labeled. Filled Date: 06 / 07 / 2024 Last Medication intake:  Today

## 2022-09-03 ENCOUNTER — Other Ambulatory Visit: Payer: Self-pay | Admitting: Pain Medicine

## 2022-09-03 ENCOUNTER — Telehealth: Payer: Self-pay

## 2022-09-03 DIAGNOSIS — M545 Low back pain, unspecified: Secondary | ICD-10-CM

## 2022-09-03 DIAGNOSIS — M5459 Other low back pain: Secondary | ICD-10-CM

## 2022-09-03 DIAGNOSIS — M47816 Spondylosis without myelopathy or radiculopathy, lumbar region: Secondary | ICD-10-CM

## 2022-09-03 DIAGNOSIS — M47817 Spondylosis without myelopathy or radiculopathy, lumbosacral region: Secondary | ICD-10-CM

## 2022-09-03 NOTE — Telephone Encounter (Signed)
This case is just like the previous one, where the patient has not had facets in several years, although having previous RFA's. Medicare will need repeat facet blocks before approving another RFA. His last facets were before 2021. Can you order the facets?

## 2022-09-03 NOTE — Progress Notes (Signed)
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

## 2022-09-05 LAB — TOXASSURE SELECT 13 (MW), URINE

## 2022-09-17 ENCOUNTER — Ambulatory Visit: Payer: Medicare Other | Admitting: Pain Medicine

## 2022-09-23 NOTE — Progress Notes (Unsigned)
PROVIDER NOTE: Interpretation of information contained herein should be left to medically-trained personnel. Specific patient instructions are provided elsewhere under "Patient Instructions" section of medical record. This document was created in part using STT-dictation technology, any transcriptional errors that may result from this process are unintentional.  Patient: Kenneth Osborn Type: Established DOB: June 08, 1955 MRN: [id number] PCP: Jerl Mina, MD  Service: Procedure DOS: 09/24/2022 Setting: Ambulatory Location: Ambulatory outpatient facility Delivery: Face-to-face Provider: Oswaldo Done, MD Specialty: Interventional Pain Management Specialty designation: 09 Location: Outpatient facility Ref. Prov.: Delano Metz, MD       Interventional Therapy   Procedure: Lumbar Facet, Medial Branch Block(s) #3  Laterality: Bilateral  Level: T12, L1, L2, L3, L4, L5, and S1 Medial Branch Level(s). Injecting these levels blocks the L1-2, L2-3, L3-4, L4-5, and L5-S1 lumbar facet joints. Imaging: Fluoroscopic guidance         Anesthesia: Local anesthesia (1-2% Lidocaine) Anxiolysis: IV Versed         Sedation:                         DOS: 09/24/2022 Performed by: Oswaldo Done, MD  Primary Purpose: Diagnostic/Therapeutic Indications: Low back pain severe enough to impact quality of life or function. 1. Chronic low back pain (Bilateral) w/o sciatica   2. Lumbar facet joint pain   3. Lumbar facet syndrome (Bilateral) (R>L)   4. Spondylosis without myelopathy or radiculopathy, lumbosacral region   5. Failed back surgical syndrome   6. Encounter for therapeutic procedure   7. DDD (degenerative disc disease), lumbosacral    NAS-11 Pain score:   Pre-procedure:  /10   Post-procedure:  /10     Position / Prep / Materials:  Position: Prone  Prep solution: DuraPrep (Iodine Povacrylex [0.7% available iodine] and Isopropyl Alcohol, 74% w/w) Area Prepped: Posterolateral  Lumbosacral Spine (Wide prep: From the lower border of the scapula down to the end of the tailbone and from flank to flank.)  Materials:  Tray: Block Needle(s):  Type: Spinal  Gauge (G): 22  Length: 5-in Qty:    ***      H&P (Pre-op Assessment):  Kenneth Osborn is a 66 y.o. (year old), male patient, seen today for interventional treatment. He  has a past surgical history that includes Cardiovascular stress test; Cardiac catheterization; Labrinthectomy; mastoid shunt (Bilateral); Back surgery; Shoulder arthroscopy with subacromial decompression (Left, 04/06/2012); ARTHRODESIS ANTERIOR ANTERIOR CERVICLE SPINE (01/04/2013); Colonoscopy with propofol (N/A, 09/19/2014); Esophagogastroduodenoscopy (N/A, 09/19/2014); Savory dilation (N/A, 09/19/2014); Spinal cord stimulator implant (Right); Cardiac catheterization (N/A, 06/15/2015); Cardiac catheterization (N/A, 06/15/2015); Pulse generator implant (N/A, 01/18/2019); Lumbar spinal cord simulator lead removal (Right, 08/09/2019); IR Perc Cholecystostomy (02/09/2020); IR Radiologist Eval & Mgmt (03/14/2020); Coronary angioplasty; Appendectomy; Robot assisted laparoscopic nephrectomy (Left, 04/20/2020); and Colonoscopy with propofol (N/A, 04/03/2021). Kenneth Osborn has a current medication list which includes the following prescription(s): alprazolam, aspirin ec, cetirizine, cyclobenzaprine, diazepam, diltiazem, esomeprazole, ezetimibe, gabapentin, glucagon, humulin r u-500 kwikpen, isosorbide mononitrate, magnesium oxide, meclizine, metformin, metoprolol succinate, montelukast, naloxone, niacin, nitroglycerin, oxycodone, [START ON 10/02/2022] oxycodone, [START ON 11/01/2022] oxycodone, prasugrel, rosuvastatin, and senna-docusate. His primarily concern today is the No chief complaint on file.  Initial Vital Signs:  Pulse/HCG Rate:    Temp:   Resp:   BP:   SpO2:    BMI: Estimated body mass index is 33.23 kg/m as calculated from the following:   Height as of 09/02/22: 5\' 9"  (1.753  m).   Weight as of 09/02/22: 225  lb (102.1 kg).  Risk Assessment: Allergies: Reviewed. He has No Known Allergies.  Allergy Precautions: None required Coagulopathies: Reviewed. None identified.  Blood-thinner therapy: None at this time Active Infection(s): Reviewed. None identified. Mr. Kerbo is afebrile  Site Confirmation: Kenneth Osborn was asked to confirm the procedure and laterality before marking the site Procedure checklist: Completed Consent: Before the procedure and under the influence of no sedative(s), amnesic(s), or anxiolytics, the patient was informed of the treatment options, risks and possible complications. To fulfill our ethical and legal obligations, as recommended by the American Medical Association's Code of Ethics, I have informed the patient of my clinical impression; the nature and purpose of the treatment or procedure; the risks, benefits, and possible complications of the intervention; the alternatives, including doing nothing; the risk(s) and benefit(s) of the alternative treatment(s) or procedure(s); and the risk(s) and benefit(s) of doing nothing. The patient was provided information about the general risks and possible complications associated with the procedure. These may include, but are not limited to: failure to achieve desired goals, infection, bleeding, organ or nerve damage, allergic reactions, paralysis, and death. In addition, the patient was informed of those risks and complications associated to Spine-related procedures, such as failure to decrease pain; infection (i.e.: Meningitis, epidural or intraspinal abscess); bleeding (i.e.: epidural hematoma, subarachnoid hemorrhage, or any other type of intraspinal or peri-dural bleeding); organ or nerve damage (i.e.: Any type of peripheral nerve, nerve root, or spinal cord injury) with subsequent damage to sensory, motor, and/or autonomic systems, resulting in permanent pain, numbness, and/or weakness of one or several areas  of the body; allergic reactions; (i.e.: anaphylactic reaction); and/or death. Furthermore, the patient was informed of those risks and complications associated with the medications. These include, but are not limited to: allergic reactions (i.e.: anaphylactic or anaphylactoid reaction(s)); adrenal axis suppression; blood sugar elevation that in diabetics may result in ketoacidosis or comma; water retention that in patients with history of congestive heart failure may result in shortness of breath, pulmonary edema, and decompensation with resultant heart failure; weight gain; swelling or edema; medication-induced neural toxicity; particulate matter embolism and blood vessel occlusion with resultant organ, and/or nervous system infarction; and/or aseptic necrosis of one or more joints. Finally, the patient was informed that Medicine is not an exact science; therefore, there is also the possibility of unforeseen or unpredictable risks and/or possible complications that may result in a catastrophic outcome. The patient indicated having understood very clearly. We have given the patient no guarantees and we have made no promises. Enough time was given to the patient to ask questions, all of which were answered to the patient's satisfaction. Mr. Hartsel has indicated that he wanted to continue with the procedure. Attestation: I, the ordering provider, attest that I have discussed with the patient the benefits, risks, side-effects, alternatives, likelihood of achieving goals, and potential problems during recovery for the procedure that I have provided informed consent. Date  Time: {CHL [facility]-PAIN TIME CHOICES:[id number]}   Pre-Procedure Preparation:  Monitoring: As per clinic protocol. Respiration, ETCO2, SpO2, BP, heart rate and rhythm monitor placed and checked for adequate function Safety Precautions: Patient was assessed for positional comfort and pressure points before starting the procedure. Time-out: I  initiated and conducted the "Time-out" before starting the procedure, as per protocol. The patient was asked to participate by confirming the accuracy of the "Time Out" information. Verification of the correct person, site, and procedure were performed and confirmed by me, the nursing staff, and the patient. "Time-out" conducted as  per Joint Commission's Universal Protocol (UP.01.01.01). Time:   Start Time:   hrs.  Description of Procedure:          Laterality: (see above) Targeted Levels: (see above)  Safety Precautions: Aspiration looking for blood return was conducted prior to all injections. At no point did we inject any substances, as a needle was being advanced. Before injecting, the patient was told to immediately notify me if he was experiencing any new onset of "ringing in the ears, or metallic taste in the mouth". No attempts were made at seeking any paresthesias. Safe injection practices and needle disposal techniques used. Medications properly checked for expiration dates. SDV (single dose vial) medications used. After the completion of the procedure, all disposable equipment used was discarded in the proper designated medical waste containers. Local Anesthesia: Protocol guidelines were followed. The patient was positioned over the fluoroscopy table. The area was prepped in the usual manner. The time-out was completed. The target area was identified using fluoroscopy. A 12-in long, straight, sterile hemostat was used with fluoroscopic guidance to locate the targets for each level blocked. Once located, the skin was marked with an approved surgical skin marker. Once all sites were marked, the skin (epidermis, dermis, and hypodermis), as well as deeper tissues (fat, connective tissue and muscle) were infiltrated with a small amount of a short-acting local anesthetic, loaded on a 10cc syringe with a 25G, 1.5-in  Needle. An appropriate amount of time was allowed for local anesthetics to take effect  before proceeding to the next step. Local Anesthetic: Lidocaine 2.0% The unused portion of the local anesthetic was discarded in the proper designated containers. Technical description of process:  L2 Medial Branch Nerve Block (MBB): The target area for the L2 medial branch is at the junction of the postero-lateral aspect of the superior articular process and the superior, posterior, and medial edge of the transverse process of L3. Under fluoroscopic guidance, a Quincke needle was inserted until contact was made with os over the superior postero-lateral aspect of the pedicular shadow (target area). After negative aspiration for blood, 0.5 mL of the nerve block solution was injected without difficulty or complication. The needle was removed intact. L3 Medial Branch Nerve Block (MBB): The target area for the L3 medial branch is at the junction of the postero-lateral aspect of the superior articular process and the superior, posterior, and medial edge of the transverse process of L4. Under fluoroscopic guidance, a Quincke needle was inserted until contact was made with os over the superior postero-lateral aspect of the pedicular shadow (target area). After negative aspiration for blood, 0.5 mL of the nerve block solution was injected without difficulty or complication. The needle was removed intact. L4 Medial Branch Nerve Block (MBB): The target area for the L4 medial branch is at the junction of the postero-lateral aspect of the superior articular process and the superior, posterior, and medial edge of the transverse process of L5. Under fluoroscopic guidance, a Quincke needle was inserted until contact was made with os over the superior postero-lateral aspect of the pedicular shadow (target area). After negative aspiration for blood, 0.5 mL of the nerve block solution was injected without difficulty or complication. The needle was removed intact. L5 Medial Branch Nerve Block (MBB): The target area for the L5  medial branch is at the junction of the postero-lateral aspect of the superior articular process and the superior, posterior, and medial edge of the sacral ala. Under fluoroscopic guidance, a Quincke needle was inserted until contact  was made with os over the superior postero-lateral aspect of the pedicular shadow (target area). After negative aspiration for blood, 0.5 mL of the nerve block solution was injected without difficulty or complication. The needle was removed intact. S1 Medial Branch Nerve Block (MBB): The target area for the S1 medial branch is at the posterior and inferior 6 o'clock position of the L5-S1 facet joint. Under fluoroscopic guidance, the Quincke needle inserted for the L5 MBB was redirected until contact was made with os over the inferior and postero aspect of the sacrum, at the 6 o' clock position under the L5-S1 facet joint (Target area). After negative aspiration for blood, 0.5 mL of the nerve block solution was injected without difficulty or complication. The needle was removed intact.  Once the entire procedure was completed, the treated area was cleaned, making sure to leave some of the prepping solution back to take advantage of its long term bactericidal properties.         Illustration of the posterior view of the lumbar spine and the posterior neural structures. Laminae of L2 through S1 are labeled. DPRL5, dorsal primary ramus of L5; DPRS1, dorsal primary ramus of S1; DPR3, dorsal primary ramus of L3; FJ, facet (zygapophyseal) joint L3-L4; I, inferior articular process of L4; LB1, lateral branch of dorsal primary ramus of L1; IAB, inferior articular branches from L3 medial branch (supplies L4-L5 facet joint); IBP, intermediate branch plexus; MB3, medial branch of dorsal primary ramus of L3; NR3, third lumbar nerve root; S, superior articular process of L5; SAB, superior articular branches from L4 (supplies L4-5 facet joint also); TP3, transverse process of L3.   Facet  Joint Innervation (* possible contribution)  L1-2 T12, L1 (L2*)  Medial Branch  L2-3 L1, L2 (L3*)         "          "  L3-4 L2, L3 (L4*)         "          "  L4-5 L3, L4 (L5*)         "          "  L5-S1 L4, L5, S1          "          "    There were no vitals filed for this visit.   End Time:   hrs.  Imaging Guidance (Spinal):          Type of Imaging Technique: Fluoroscopy Guidance (Spinal) Indication(s): Assistance in needle guidance and placement for procedures requiring needle placement in or near specific anatomical locations not easily accessible without such assistance. Exposure Time: Please see nurses notes. Contrast: None used. Fluoroscopic Guidance: I was personally present during the use of fluoroscopy. "Tunnel Vision Technique" used to obtain the best possible view of the target area. Parallax error corrected before commencing the procedure. "Direction-depth-direction" technique used to introduce the needle under continuous pulsed fluoroscopy. Once target was reached, antero-posterior, oblique, and lateral fluoroscopic projection used confirm needle placement in all planes. Images permanently stored in EMR. Interpretation: No contrast injected. I personally interpreted the imaging intraoperatively. Adequate needle placement confirmed in multiple planes. Permanent images saved into the patient's record.  Post-operative Assessment:  Post-procedure Vital Signs:  Pulse/HCG Rate:    Temp:   Resp:   BP:   SpO2:    EBL: None  Complications: No immediate post-treatment complications observed by team, or reported by patient.  Note: The patient tolerated  the entire procedure well. A repeat set of vitals were taken after the procedure and the patient was kept under observation following institutional policy, for this type of procedure. Post-procedural neurological assessment was performed, showing return to baseline, prior to discharge. The patient was provided with post-procedure  discharge instructions, including a section on how to identify potential problems. Should any problems arise concerning this procedure, the patient was given instructions to immediately contact us, at any time, without hesitation. In any case, we plan to contact the patient by telephone for a follow-up status report regarding this interventional procedure.  Comments:  No additional relevant information.  Plan of Care (POC)  Orders:  No orders of the defined types were placed in this encounter.  Chronic Opioid Analgesic:  Oxycodone IR 5 mg, 1 tab PO TID MME/day: 37.5 mg/day.   Medications ordered for procedure: No orders of the defined types were placed in this encounter.  Medications administered: Mancel Bale "[facility]" had no medications administered during this visit.  See the medical record for exact dosing, route, and time of administration.  Follow-up plan:   No follow-ups on file.       Interventional Therapies  Risk Factors  Considerations:   EFFIENT Anticoagulation: (Stop: 7-10 days  Re-start: 6 hrs)  Mnire's disease  OSA  GERD  CAD  (B) Carotid Artery Stenosis  s/p CABG  Hx. Cardiac Syncope  T2IDDM  Hx. MI (06/2010)  Unstable Angina  Lumbar SCS: implanted on 01/18/2019 and removed on 08/09/2019.   Planned  Pending:   Diagnostic/therapeutic left suprapatellar intra-articular steroid patellofemoral knee injection #1   Under consideration:   Diagnostic/therapeutic left suprapatellar intra-articular steroid patellofemoral knee injection #1 CYP2D6 / CYP3A4 genetic testing.   Completed:   Therapeutic right Racz procedure x1 (12/18/2021) (100/100/70/70-100)  Diagnostic/therapeutic right caudal ESI x1 (11/13/2021)  Palliative right PSIS MNB/TPI x3 (11/13/2017) (100/100/100)  Diagnostic/palliative right lumbar facet MBB x2 (09/22/2018) (100/100/75/75)  Diagnostic/palliative left lumbar facet MBB x2 (09/22/2018) (100/100/75/75)  Palliative left lumbar facet RFA x3  (02/20/2021) (85/85/55/60-70)  Palliative right lumbar facet RFA x3 (01/09/2021) (100/95/50/60-70)    Therapeutic  Palliative (PRN) options:   Palliative right PSIS MNB/TPI  Diagnostic/palliative lumbar facet MBB  Palliative lumbar facet RFA    Pharmacotherapy  Nonopioids transferred 11/22/2019: Magnesium, Flexeril, and Neurontin. Last "Drug Holiday": See 05/06/2022 note.  (To be completed on 05/27/2022)        Recent Visits Date Type Provider Dept  09/02/22 Office Visit Delano Metz, MD [facility]  06/27/22 Office Visit Delano Metz, MD [facility]  Showing recent visits within past 90 days and meeting all other requirements Future Appointments Date Type Provider Dept  09/24/22 Appointment Delano Metz, MD [facility]  11/25/22 Appointment Delano Metz, MD [facility]  Showing future appointments within next 90 days and meeting all other requirements  Disposition: Discharge home  Discharge (Date  Time): 09/24/2022;   hrs.   Primary Care Physician: Jerl Mina, MD Location: [facility] Note by: Oswaldo Done, MD (TTS technology used. I apologize for any typographical errors that were not detected and corrected.) Date: 09/24/2022; Time: 8:28 PM  Disclaimer:  Medicine is not an Visual merchandiser. The only guarantee in medicine is that nothing is guaranteed. It is important to note that the decision to proceed with this intervention was based on the information collected from the patient. The Data and conclusions were drawn from the patient's questionnaire, the interview, and the physical examination.  Because the information was provided in large part by the patient, it cannot be guaranteed that it has not been purposely or unconsciously manipulated. Every effort has been made to obtain as much relevant data as possible for this evaluation. It is important to note that the conclusions that lead  to this procedure are derived in large part from the available data. Always take into account that the treatment will also be dependent on availability of resources and existing treatment guidelines, considered by other Pain Management Practitioners as being common knowledge and practice, at the time of the intervention. For Medico-Legal purposes, it is also important to point out that variation in procedural techniques and pharmacological choices are the acceptable norm. The indications, contraindications, technique, and results of the above procedure should only be interpreted and judged by a Board-Certified Interventional Pain Specialist with extensive familiarity and expertise in the same exact procedure and technique.

## 2022-09-24 ENCOUNTER — Encounter: Payer: Self-pay | Admitting: Pain Medicine

## 2022-09-24 ENCOUNTER — Ambulatory Visit: Payer: Medicare Other | Attending: Pain Medicine | Admitting: Pain Medicine

## 2022-09-24 ENCOUNTER — Ambulatory Visit
Admission: RE | Admit: 2022-09-24 | Discharge: 2022-09-24 | Disposition: A | Discharge status: Home / Self Care | Payer: Medicare Other | Source: Ambulatory Visit | Attending: Pain Medicine | Admitting: Pain Medicine

## 2022-09-24 VITALS — BP 104/80 | HR 76 | Temp 97.2°F | Resp 17 | Ht 69.0 in | Wt 225.0 lb

## 2022-09-24 DIAGNOSIS — M47817 Spondylosis without myelopathy or radiculopathy, lumbosacral region: Secondary | ICD-10-CM | POA: Diagnosis present

## 2022-09-24 DIAGNOSIS — M5137 Other intervertebral disc degeneration, lumbosacral region: Secondary | ICD-10-CM | POA: Diagnosis present

## 2022-09-24 DIAGNOSIS — Z7901 Long term (current) use of anticoagulants: Secondary | ICD-10-CM | POA: Diagnosis present

## 2022-09-24 DIAGNOSIS — M545 Low back pain, unspecified: Secondary | ICD-10-CM | POA: Insufficient documentation

## 2022-09-24 DIAGNOSIS — M47816 Spondylosis without myelopathy or radiculopathy, lumbar region: Secondary | ICD-10-CM | POA: Insufficient documentation

## 2022-09-24 DIAGNOSIS — M5459 Other low back pain: Secondary | ICD-10-CM | POA: Diagnosis present

## 2022-09-24 DIAGNOSIS — M961 Postlaminectomy syndrome, not elsewhere classified: Secondary | ICD-10-CM | POA: Insufficient documentation

## 2022-09-24 DIAGNOSIS — Z5189 Encounter for other specified aftercare: Secondary | ICD-10-CM | POA: Diagnosis present

## 2022-09-24 DIAGNOSIS — G8929 Other chronic pain: Secondary | ICD-10-CM | POA: Insufficient documentation

## 2022-09-24 MED ORDER — ROPIVACAINE HCL 2 MG/ML IJ SOLN
INTRAMUSCULAR | Status: AC
  Filled 2022-09-24: qty 20

## 2022-09-24 MED ORDER — MIDAZOLAM HCL 5 MG/5ML IJ SOLN
INTRAMUSCULAR | Status: AC
  Filled 2022-09-24: qty 5

## 2022-09-24 MED ORDER — LIDOCAINE HCL 2 % IJ SOLN
20.0000 mL | Freq: Once | INTRAMUSCULAR | Status: AC
  Administered 2022-09-24: 400 mg

## 2022-09-24 MED ORDER — TRIAMCINOLONE ACETONIDE 40 MG/ML IJ SUSP
INTRAMUSCULAR | Status: AC
  Filled 2022-09-24: qty 2

## 2022-09-24 MED ORDER — FENTANYL CITRATE (PF) 100 MCG/2ML IJ SOLN
INTRAMUSCULAR | Status: AC
  Filled 2022-09-24: qty 2

## 2022-09-24 MED ORDER — LACTATED RINGERS IV SOLN: Freq: Once | INTRAVENOUS | Status: AC

## 2022-09-24 MED ORDER — TRIAMCINOLONE ACETONIDE 40 MG/ML IJ SUSP
80.0000 mg | Freq: Once | INTRAMUSCULAR | Status: AC
  Administered 2022-09-24: 40 mg

## 2022-09-24 MED ORDER — PENTAFLUOROPROP-TETRAFLUOROETH EX AERO
INHALATION_SPRAY | Freq: Once | CUTANEOUS | Status: AC
  Administered 2022-09-24: 30 via TOPICAL
  Filled 2022-09-24: qty 116

## 2022-09-24 MED ORDER — MIDAZOLAM HCL 5 MG/5ML IJ SOLN
0.5000 mg | Freq: Once | INTRAMUSCULAR | Status: AC
  Administered 2022-09-24 (×2): 1 mg via INTRAVENOUS

## 2022-09-24 MED ORDER — FENTANYL CITRATE (PF) 100 MCG/2ML IJ SOLN
25.0000 ug | INTRAMUSCULAR | Status: DC | PRN
  Administered 2022-09-24: 50 ug via INTRAVENOUS

## 2022-09-24 MED ORDER — LIDOCAINE HCL 2 % IJ SOLN
INTRAMUSCULAR | Status: AC
  Filled 2022-09-24: qty 20

## 2022-09-24 MED ORDER — ROPIVACAINE HCL 2 MG/ML IJ SOLN: 18.0000 mL | Freq: Once | INTRAMUSCULAR | Status: DC

## 2022-09-24 NOTE — Patient Instructions (Signed)

## 2022-09-25 ENCOUNTER — Telehealth: Payer: Self-pay

## 2022-09-25 ENCOUNTER — Telehealth: Payer: Self-pay | Admitting: Pain Medicine

## 2022-09-25 NOTE — Telephone Encounter (Signed)
Spoke with patient and informed him that he should not be taking medication more than prescribed.  Informed him that he will run out early and it will not be refilled early.  Patient states u nderstanding

## 2022-09-25 NOTE — Telephone Encounter (Signed)
Post procedure follow up..  Patient states he is doing good 

## 2022-09-25 NOTE — Telephone Encounter (Signed)
PT states he forgot to mention to tell the nurse that he is taking more of his pain medication to help with the pain. Since he had procedure done on yesterday. PT wanted to make sure that it's okay. Please give patient a call. TY

## 2022-10-02 DIAGNOSIS — Z955 Presence of coronary angioplasty implant and graft: Secondary | ICD-10-CM | POA: Insufficient documentation

## 2022-10-08 ENCOUNTER — Ambulatory Visit: Payer: Medicare Other | Admitting: Pain Medicine

## 2022-10-13 NOTE — Progress Notes (Unsigned)
PROVIDER NOTE: Information contained herein reflects review and annotations entered in association with encounter. Interpretation of such information and data should be left to medically-trained personnel. Information provided to patient can be located elsewhere in the medical record under "Patient Instructions". Document created using STT-dictation technology, any transcriptional errors that may result from process are unintentional.    Patient: Kenneth Osborn  Service Category: E/M  Provider: Oswaldo Done, MD  DOB: [birth year]  DOS: 10/15/2022  Referring Provider: Jerl Mina, MD  MRN: [id number]  Specialty: Interventional Pain Management  PCP: Jerl Mina, MD  Type: Established Patient  Setting: Ambulatory outpatient    Location: Office  Delivery: Face-to-face     HPI  Kenneth Osborn, a 67 y.o. year old male, is here today because of his Lumbar facet joint pain [M54.59]. Kenneth Osborn primary complain today is No chief complaint on file.  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); Chronic pain syndrome; Headache disorder; Trigger point posterior superior iliac spine (PSIS)  (Right); 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); Polyneuropathy; Type 2 diabetes mellitus with diabetic polyneuropathy, with long-term current use of insulin (HCC); Spondylosis without myelopathy or radiculopathy, lumbosacral region; Frequent falls; Diabetic polyneuropathy associated with diabetes mellitus due to underlying condition (HCC); Neuropathic pain; Musculoskeletal pain; DDD (degenerative disc disease), lumbosacral; Chronic low back pain (Bilateral) w/o sciatica; Renal mass; 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; Malignant neoplasm of left kidney, except renal pelvis (HCC); Chronic low back pain (Right) w/ sciatica (Right); Chronic lower extremity pain (Right); Chronic Lumbar radicular pain (Right: S1); Epidural fibrosis; Acute pain of left knee; Chronic knee pain (Left); Osteoarthritis of knee (Left); Osteoarthritis of patellofemoral joint (Left); Baker's cyst (tiny) (Left); Lumbar facet joint pain; and Abnormal MRI, lumbar spine (12/24/2013) on their pertinent problem list. Pain Assessment: Severity of Chronic pain is reported as a 4 /10. Location: Back Lower, Left/left leg to just below theknee. Onset: More than a month ago. Quality: Sharp. Timing: Constant. Modifying factor(s): medications, rest. Vitals:  height is 5\' 9"  (1.753 m) and weight is 210 lb (95.3 kg). His temporal temperature is 97 F (36.1 C) (abnormal). His blood pressure is 102/87 and his pulse is 79. His respiration is 16 and oxygen saturation is 93%.  BMI: Estimated body mass index is 31.01 kg/m as calculated from the following:   Height as of this encounter: 5\' 9"  (1.753 m).   Weight as of this encounter: 210 lb (95.3 kg). Last encounter: 09/02/2022. Last procedure: 09/24/2022.  Reason for encounter: post-procedure evaluation and assessment. The patient indicates having attained 100% relief of the pain for the duration of the local anesthetic followed by a decrease to an ongoing  50% improvement.  Today the patient indicates that his primary area of pain is that of the left side of his lower back as well as the left  lower extremity with the pain going over his anterior thigh and what appears to be an L2/L3 dermatomal distribution.  He comes in today indicating that he had some questions with regards to some information that he obtained in the waiting room about the MILD procedure.  I have explained what the procedure entails and he wants to know if he would be a candidate for this.  Today I took the time to look into his electronic medical record for his last lumbar MRI which was done on 2015.  This MRI does indicate that the patient has multilevel ligamentum flavum hypertrophy which would make him a decent candidate for the MILD procedure.  In order to further investigate this I will be ordering a new MRI of the lumbar spine since he continues to have persistent pain despite our interventional treatments.  The plan was shared with the patient who understood and accepted.  Post-procedure evaluation   Procedure: Lumbar Facet, Medial Branch Block(s) #3  Laterality: Bilateral  Level: L2, L3, L4, L5, and S1 Medial Branch Level(s). Injecting these levels blocks the L3-4, L4-5, and L5-S1 lumbar facet joints. Imaging: Fluoroscopic guidance         Anesthesia: Local anesthesia (1-2% Lidocaine) Anxiolysis: IV Versed 2.0 mg Sedation: Moderate Sedation None required. No Fentanyl administered.         DOS: 09/24/2022 Performed by: Oswaldo Done, MD  Primary Purpose: Diagnostic/Therapeutic Indications: Low back pain severe enough to impact quality of life or function. 1. Chronic low back pain (Bilateral) w/o sciatica   2. Lumbar facet joint pain   3. Lumbar facet syndrome (Bilateral) (R>L)   4. Spondylosis without myelopathy or radiculopathy, lumbosacral region   5. Failed back surgical syndrome   6. Encounter for therapeutic procedure   7. DDD (degenerative disc disease),  lumbosacral    NAS-11 Pain score:   Pre-procedure: 3 /10   Post-procedure: 0-No pain/10      Effectiveness:  Initial hour after procedure: 100 %. Subsequent 4-6 hours post-procedure: 100 %. Analgesia past initial 6 hours: 50 %. Ongoing improvement:  Analgesic: The patient indicates having attained 100% relief of the pain for the duration of the local anesthetic followed by a decrease to an ongoing 50% improvement. Function: Kenneth Osborn reports improvement in function ROM: Kenneth Osborn reports improvement in ROM  Pharmacotherapy Assessment  Analgesic: Oxycodone IR 5 mg, 1 tab PO TID MME/day: 37.5 mg/day.   Monitoring: [place] PMP: PDMP reviewed during this encounter.       Pharmacotherapy: No side-effects or adverse reactions reported. Compliance: No problems identified. Effectiveness: Clinically acceptable.  No notes on file  No results found for: "CBDTHCR" No results found for: "D8THCCBX" No results found for: "D9THCCBX"  UDS:  Summary  Date Value Ref Range Status  09/02/2022 Note  Final    Comment:    ==================================================================== [employer] Select 13 (MW) ==================================================================== Test                             Result       Flag       Units  Drug Present and Declared for Prescription Verification   Desmethyldiazepam              230          EXPECTED   ng/mg creat   Oxazepam  653          EXPECTED   ng/mg creat   Temazepam                      360          EXPECTED   ng/mg creat    Desmethyldiazepam, oxazepam, and temazepam are expected metabolites    of diazepam. Desmethyldiazepam and oxazepam are also expected    metabolites of other drugs, including chlordiazepoxide, prazepam,    clorazepate, and halazepam. Oxazepam is an expected metabolite of    temazepam. Oxazepam and temazepam are also available as scheduled    prescription medications.    Oxycodone                       587          EXPECTED   ng/mg creat   Oxymorphone                    482          EXPECTED   ng/mg creat   Noroxycodone                   265          EXPECTED   ng/mg creat   Noroxymorphone                 70           EXPECTED   ng/mg creat    Sources of oxycodone are scheduled prescription medications.    Oxymorphone, noroxycodone, and noroxymorphone are expected    metabolites of oxycodone. Oxymorphone is also available as a    scheduled prescription medication.  Drug Absent but Declared for Prescription Verification   Alprazolam                     Not Detected UNEXPECTED ng/mg creat ==================================================================== Test                      Result    Flag   Units      Ref Range   Creatinine              83               mg/dL      >=16 ==================================================================== Declared Medications:  The flagging and interpretation on this report are based on the  following declared medications.  Unexpected results may arise from  inaccuracies in the declared medications.   **Note: The testing scope of this panel includes these medications:   Alprazolam (Xanax)  Diazepam (Valium)  Oxycodone (Roxicodone)   **Note: The testing scope of this panel does not include the  following reported medications:   Aspirin  Cetirizine (Zyrtec)  Cyclobenzaprine (Flexeril)  Diltiazem (Cardizem)  Esomeprazole (Nexium)  Ezetimibe (Zetia)  Gabapentin (Neurontin)  Insulin (Humulin)  Isosorbide (Imdur)  Magnesium (Mag-Ox)  Meclizine (Antivert)  Metformin (Glucophage)  Metoprolol (Toprol)  Montelukast (Singulair)  Naloxone (Narcan)  Nitroglycerin (Nitrostat)  Prasugrel (Effient)  Rosuvastatin (Crestor)  Sennosides (Senokot)  Vitamin B3 ==================================================================== For clinical consultation, please call [phone]. ====================================================================       ROS  Constitutional: Denies any fever or chills Gastrointestinal: No reported hemesis, hematochezia, vomiting, or acute GI distress Musculoskeletal: Denies any acute onset joint swelling, redness, loss of ROM, or weakness Neurological: No reported episodes of acute onset apraxia, aphasia, dysarthria, agnosia,  amnesia, paralysis, loss of coordination, or loss of consciousness  Medication Review  ALPRAZolam, Glucagon, Magnesium Oxide, aspirin EC, cetirizine, cyclobenzaprine, diazepam, diltiazem, esomeprazole, ezetimibe, gabapentin, insulin regular human CONCENTRATED, isosorbide mononitrate, meclizine, metFORMIN, metoprolol succinate, montelukast, naloxone, niacin, nitroGLYCERIN, oxyCODONE, prasugrel, rosuvastatin, and senna-docusate  History Review  Allergy: Kenneth Osborn has No Known Allergies. Drug: Kenneth Osborn  reports no history of drug use. Alcohol:  reports no history of alcohol use. Tobacco:  reports that he has never smoked. He has never used smokeless tobacco. Social: Kenneth Osborn  reports that he has never smoked. He has never used smokeless tobacco. He reports that he does not drink alcohol and does not use drugs. Medical:  has a past medical history of Acute postoperative pain (12/03/2018), Allergic rhinitis (12/30/2012), Anginal pain (HCC), Anxiety, Bronchitis, Can't get food down (08/12/2014), Cancer (HCC) (01/2020), Chronic back pain, Concussion (09/2015), Coronary artery disease, DDD (degenerative disc disease), cervical, Dehydration symptoms, Diabetes mellitus without complication (HCC), Dysphagia, GERD (gastroesophageal reflux disease), History of Meniere's disease (12/21/2014), History of thoracic spine surgery (S/P T9-10 IVD spacer) (12/21/2014), Hypercholesteremia, Hyperlipidemia, Hypertension, Meniere's disease, Myocardial infarction (HCC) (2012), Neuromuscular disorder (HCC), Renal disorder, Severe  sepsis (HCC) (02/07/2020), Short-segment Barrett's esophagus, and Sleep apnea. Surgical: Kenneth Osborn  has a past surgical history that includes Cardiovascular stress test; Cardiac catheterization; Labrinthectomy; mastoid shunt (Bilateral); Back surgery; Shoulder arthroscopy with subacromial decompression (Left, 04/06/2012); ARTHRODESIS ANTERIOR ANTERIOR CERVICLE SPINE (01/04/2013); Colonoscopy with propofol (N/A, 09/19/2014); Esophagogastroduodenoscopy (N/A, 09/19/2014); Savory dilation (N/A, 09/19/2014); Spinal cord stimulator implant (Right); Cardiac catheterization (N/A, 06/15/2015); Cardiac catheterization (N/A, 06/15/2015); Pulse generator implant (N/A, 01/18/2019); Lumbar spinal cord simulator lead removal (Right, 08/09/2019); IR Perc Cholecystostomy (02/09/2020); IR Radiologist Eval & Mgmt (03/14/2020); Coronary angioplasty; Appendectomy; Robot assisted laparoscopic nephrectomy (Left, 04/20/2020); and Colonoscopy with propofol (N/A, 04/03/2021). Family: family history includes Cancer in his sister; Diabetes in his maternal grandmother, mother, and paternal grandmother; Heart disease in his father, maternal aunt, maternal uncle, and mother.  Laboratory Chemistry Profile   Renal Lab Results  Component Value Date   BUN 37 (H) 11/02/2021   CREATININE 2.18 (H) 11/02/2021   GFRAA 58 (L) 01/08/2019   GFRNONAA 32 (L) 11/02/2021    Hepatic Lab Results  Component Value Date   AST 33 11/01/2021   ALT 31 11/01/2021   ALBUMIN 4.0 11/01/2021   ALKPHOS 79 11/01/2021   HCVAB NON REACTIVE 02/08/2020   LIPASE 28 12/11/2020   AMMONIA 24 02/07/2020    Electrolytes Lab Results  Component Value Date   NA 138 11/02/2021   K 5.9 (H) 11/02/2021   CL 107 11/02/2021   CALCIUM 9.8 11/02/2021   MG 1.6 (L) 11/02/2021   PHOS 3.1 11/09/2020    Bone No results found for: "VD25OH", "VD125OH2TOT", "OH6073XT0", "GY6948NI6", "25OHVITD1", "25OHVITD2", "25OHVITD3", "TESTOFREE", "TESTOSTERONE"  Inflammation (CRP: Acute Phase)  (ESR: Chronic Phase) Lab Results  Component Value Date   CRP 1.4 (H) 03/22/2015   ESRSEDRATE 9 03/22/2015   LATICACIDVEN 1.5 12/11/2020         Note: Above Lab results reviewed.  Recent Imaging Review  DG PAIN CLINIC C-ARM 1-60 MIN NO REPORT Fluoro was used, but no Radiologist interpretation will be provided.  Please refer to "NOTES" tab for provider progress note. Note: Reviewed        Physical Exam  General appearance: Well nourished, well developed, and well hydrated. In no apparent acute distress Mental status: Alert, oriented x 3 (person, place, & time)       Respiratory: No evidence  of acute respiratory distress Eyes: PERLA Vitals: BP 102/87   Pulse 79   Temp (!) 97 F (36.1 C) (Temporal)   Resp 16   Ht 5\' 9"  (1.753 m)   Wt 210 lb (95.3 kg)   SpO2 93%   BMI 31.01 kg/m  BMI: Estimated body mass index is 31.01 kg/m as calculated from the following:   Height as of this encounter: 5\' 9"  (1.753 m).   Weight as of this encounter: 210 lb (95.3 kg). Ideal: Ideal body weight: 70.7 kg (155 lb 13.8 oz) Adjusted ideal body weight: 80.5 kg (177 lb 8.3 oz)  Assessment   Diagnosis Status  1. Lumbar facet joint pain   2. Lumbar facet syndrome (Bilateral) (R>L)   3. Postop check   4. Abnormal MRI, lumbar spine (12/24/2013)   5. Chronic low back pain (Bilateral) (L>R) w/ sciatica (Left)   6. Chronic Lumbar radicular pain (Left: L4)   7. Chronic lower extremity pain (Left)   8. DDD (degenerative disc disease), lumbosacral   9. Failed back surgical syndrome    Controlled Controlled Controlled   Updated Problems: Problem  Abnormal MRI, lumbar spine (12/24/2013)   FINDINGS: The vertebral bodies are normally aligned. Bone marrow signal intensity is normal. Mild disc dessication is present at L3-S1. The visualized cord is unremarkable and the conus medullaris ends at a normal level. Minimal endplate degenerative change at a few levels.  At L1-2, there is thickening of the  ligamentum flavum without significant central canal stenosis or neural foraminal narrowing.  At L2-3, there is thickening of the ligamentum flavum and facet hypertrophy which results in mild central canal stenosis. There is no significant neural foraminal narrowing.  At L3-4, there is thickening of the ligamentum flavum and facet hypertrophy which results in mild central canal stenosis. There is no significant neural foraminal narrowing.  At L4-5, there is thickening of the ligamentum flavum without significant central canal stenosis or neural foraminal narrowing.  At L5-S1, there is no significant central canal stenosis or neural foraminal narrowing. There is a small annular fissure.  For the purposes of this dictation, the lowest well formed intervertebral disc space is assumed to be the L5-S1 level, and there are presumed to be five lumbar-type vertebral bodies. Mild edema in the soft tissues of the lower back.  IMPRESSION: Mild degenerative changes without significant spinal canal or neural foraminal stenosis.   History of Coronary Artery Stent Placement  Proteinuria    Plan of Care  Problem-specific:  No problem-specific Assessment & Plan notes found for this encounter.  Kenneth Osborn has a current medication list which includes the following long-term medication(s): cetirizine, cyclobenzaprine, esomeprazole, ezetimibe, gabapentin, humulin r u-500 kwikpen, metoprolol succinate, montelukast, niacin, oxycodone, [START ON 11/01/2022] oxycodone, and oxycodone.  Pharmacotherapy (Medications Ordered): No orders of the defined types were placed in this encounter.  Orders:  Orders Placed This Encounter  Procedures   MR LUMBAR SPINE WO CONTRAST    Patient presents with axial pain with possible radicular component. Please assist [name] in identifying specific level(s) and laterality of any additional findings such as: 1. Facet (Zygapophyseal) joint DJD (Hypertrophy, space narrowing,  subchondral sclerosis, and/or osteophyte formation) 2. DDD and/or IVDD (Loss of disc height, desiccation, gas patterns, osteophytes, endplate sclerosis, or "Black disc disease") 3. Pars defects 4. Spondylolisthesis, spondylosis, and/or spondyloarthropathies (include Degree/Grade of displacement in mm) (stability) 5. Vertebral body Fractures (acute/chronic) (state percentage of collapse) 6. Demineralization (osteopenia/osteoporotic) 7. Bone pathology 8. Foraminal narrowing  9.  Surgical changes 10. Central, Lateral Recess, and/or Foraminal Stenosis (include AP diameter of stenosis in mm) 11. Surgical changes (hardware type, status, and presence of fibrosis) 12. Modic Type Changes (MRI only) 13. IVDD (Disc bulge, protrusion, herniation, extrusion) (Level, laterality, extent)    Standing Status:   Future    Standing Expiration Date:   01/15/2023    Scheduling Instructions:     Please make sure that the patient understands that this needs to be done as soon as possible. Never have the patient do the imaging "just before the next appointment". Inform patient that having the imaging done within the [facility] will expedite the availability of the results and will provide      imaging availability to the requesting physician. In addition inform the patient that the imaging order has an expiration date and will not be renewed if not done within the active period.    Order Specific Question:   What is the patient's sedation requirement?    Answer:   No Sedation    Order Specific Question:   Does the patient have a pacemaker or implanted devices?    Answer:   No    Order Specific Question:   Preferred imaging location?    Answer:   [facility] (table limit-350lbs)    Order Specific Question:   Call Results- Best Contact Number?    Answer:   [phone] [facility]    Order Specific Question:   Radiology Contrast Protocol - do NOT remove  file path    Answer:   [email or web address]   Nursing Instructions:    Please complete this patient's postprocedure evaluation.    Scheduling Instructions:     Please complete this patient's postprocedure evaluation.   Follow-up plan:   Return for Eval-day (M,W), (F2F), for review of ordered tests (MRI).      Interventional Therapies  Risk Factors  Considerations:   EFFIENT Anticoagulation: (Stop: 7-10 days  Re-start: 6 hrs)  Mnire's disease  OSA  GERD  CAD  (B) Carotid Artery Stenosis  s/p CABG  Hx. Cardiac Syncope  T2IDDM  Hx. MI (06/2010)  Unstable Angina  Lumbar SCS: implanted on 01/18/2019 and removed on 08/09/2019.   Planned  Pending:   Today I have ordered a repeat MRI of the lumbar spine since his last MRI was in 2015 and his condition continues to worsen and he is response to interventional therapies has been somewhat limited. Diagnostic/therapeutic left suprapatellar intra-articular steroid patellofemoral knee injection #1   Under consideration:   Diagnostic/therapeutic left suprapatellar intra-articular steroid patellofemoral knee injection #1 CYP2D6 / CYP3A4 genetic testing.   Completed:   Therapeutic right Racz procedure x1 (12/18/2021) (100/100/70/70-100)  Diagnostic/therapeutic right caudal ESI x1 (11/13/2021)  Palliative right PSIS MNB/TPI x3 (11/13/2017) (100/100/100)  Diagnostic/palliative right lumbar facet MBB x2 (09/22/2018) (100/100/75/75)  Diagnostic/palliative left lumbar facet MBB x2 (09/22/2018) (100/100/75/75)  Palliative left lumbar facet RFA x3 (02/20/2021) (85/85/55/60-70)  Palliative right lumbar facet RFA x3 (01/09/2021) (100/95/50/60-70)    Therapeutic  Palliative (PRN) options:   Palliative right PSIS MNB/TPI  Diagnostic/palliative lumbar facet MBB  Palliative lumbar facet RFA    Pharmacotherapy  Nonopioids transferred 11/22/2019: Magnesium, Flexeril, and Neurontin. Last "Drug Holiday": See 05/06/2022 note.  (To be  completed on 05/27/2022)       Recent Visits Date Type Provider Dept  09/24/22 Procedure visit Delano Metz, MD [facility]  09/02/22 Office Visit Delano Metz, MD [facility]  Showing  recent visits within past 90 days and meeting all other requirements Today's Visits Date Type Provider Dept  10/15/22 Office Visit Delano Metz, MD [facility]  Showing today's visits and meeting all other requirements Future Appointments Date Type Provider Dept  11/25/22 Appointment Delano Metz, MD [facility]  Showing future appointments within next 90 days and meeting all other requirements  I discussed the assessment and treatment plan with the patient. The patient was provided an opportunity to ask questions and all were answered. The patient agreed with the plan and demonstrated an understanding of the instructions.  Patient advised to call back or seek an in-person evaluation if the symptoms or condition worsens.  Duration of encounter: 30 minutes.  Total time on encounter, as per AMA guidelines included both the face-to-face and non-face-to-face time personally spent by the physician and/or other qualified health care professional(s) on the day of the encounter (includes time in activities that require the physician or other qualified health care professional and does not include time in activities normally performed by clinical staff). Physician's time may include the following activities when performed: Preparing to see the patient (e.g., pre-charting review of records, searching for previously ordered imaging, lab work, and nerve conduction tests) Review of prior analgesic pharmacotherapies. Reviewing PMP Interpreting ordered tests (e.g., lab work, imaging, nerve conduction tests) Performing post-procedure evaluations, including interpretation of diagnostic procedures Obtaining and/or reviewing separately obtained history Performing  a medically appropriate examination and/or evaluation Counseling and educating the patient/family/caregiver Ordering medications, tests, or procedures Referring and communicating with other health care professionals (when not separately reported) Documenting clinical information in the electronic or other health record Independently interpreting results (not separately reported) and communicating results to the patient/ family/caregiver Care coordination (not separately reported)  Note by: Oswaldo Done, MD Date: 10/15/2022; Time: 4:54 PM

## 2022-10-15 ENCOUNTER — Encounter: Payer: Self-pay | Admitting: Pain Medicine

## 2022-10-15 ENCOUNTER — Ambulatory Visit: Payer: Medicare Other | Attending: Pain Medicine | Admitting: Pain Medicine

## 2022-10-15 VITALS — BP 102/87 | HR 79 | Temp 97.0°F | Resp 16 | Ht 69.0 in | Wt 210.0 lb

## 2022-10-15 DIAGNOSIS — M79605 Pain in left leg: Secondary | ICD-10-CM | POA: Diagnosis present

## 2022-10-15 DIAGNOSIS — M5137 Other intervertebral disc degeneration, lumbosacral region: Secondary | ICD-10-CM | POA: Diagnosis present

## 2022-10-15 DIAGNOSIS — R937 Abnormal findings on diagnostic imaging of other parts of musculoskeletal system: Secondary | ICD-10-CM | POA: Insufficient documentation

## 2022-10-15 DIAGNOSIS — G8929 Other chronic pain: Secondary | ICD-10-CM | POA: Diagnosis present

## 2022-10-15 DIAGNOSIS — M5459 Other low back pain: Secondary | ICD-10-CM | POA: Diagnosis present

## 2022-10-15 DIAGNOSIS — M961 Postlaminectomy syndrome, not elsewhere classified: Secondary | ICD-10-CM | POA: Insufficient documentation

## 2022-10-15 DIAGNOSIS — M47816 Spondylosis without myelopathy or radiculopathy, lumbar region: Secondary | ICD-10-CM | POA: Diagnosis present

## 2022-10-15 DIAGNOSIS — M5442 Lumbago with sciatica, left side: Secondary | ICD-10-CM | POA: Insufficient documentation

## 2022-10-15 DIAGNOSIS — M5416 Radiculopathy, lumbar region: Secondary | ICD-10-CM | POA: Insufficient documentation

## 2022-10-15 DIAGNOSIS — Z09 Encounter for follow-up examination after completed treatment for conditions other than malignant neoplasm: Secondary | ICD-10-CM | POA: Diagnosis present

## 2022-10-18 ENCOUNTER — Ambulatory Visit: Payer: Medicare Other

## 2022-11-04 ENCOUNTER — Ambulatory Visit
Admission: RE | Admit: 2022-11-04 | Discharge: 2022-11-04 | Disposition: A | Discharge status: Home / Self Care | Payer: Medicare Other | Source: Ambulatory Visit | Attending: Pain Medicine | Admitting: Pain Medicine

## 2022-11-04 DIAGNOSIS — M5416 Radiculopathy, lumbar region: Secondary | ICD-10-CM | POA: Diagnosis present

## 2022-11-04 DIAGNOSIS — G8929 Other chronic pain: Secondary | ICD-10-CM

## 2022-11-04 DIAGNOSIS — R937 Abnormal findings on diagnostic imaging of other parts of musculoskeletal system: Secondary | ICD-10-CM

## 2022-11-04 DIAGNOSIS — M5442 Lumbago with sciatica, left side: Secondary | ICD-10-CM | POA: Diagnosis present

## 2022-11-04 DIAGNOSIS — M51379 Other intervertebral disc degeneration, lumbosacral region without mention of lumbar back pain or lower extremity pain: Secondary | ICD-10-CM

## 2022-11-04 DIAGNOSIS — M961 Postlaminectomy syndrome, not elsewhere classified: Secondary | ICD-10-CM

## 2022-11-04 DIAGNOSIS — M79605 Pain in left leg: Secondary | ICD-10-CM | POA: Insufficient documentation

## 2022-11-04 DIAGNOSIS — M5137 Other intervertebral disc degeneration, lumbosacral region: Secondary | ICD-10-CM | POA: Insufficient documentation

## 2022-11-24 NOTE — Progress Notes (Unsigned)
PROVIDER NOTE: Information contained herein reflects review and annotations entered in association with encounter. Interpretation of such information and data should be left to medically-trained personnel. Information provided to patient can be located elsewhere in the medical record under "Patient Instructions". Document created using STT-dictation technology, any transcriptional errors that may result from process are unintentional.    Patient: Kenneth Osborn  Service Category: E/M  Provider: Oswaldo Done, MD  DOB: July 20, 1955  DOS: 11/25/2022  Referring Provider: Jerl Mina, MD  MRN: [id number]  Specialty: Interventional Pain Management  PCP: Jerl Mina, MD  Type: Established Patient  Setting: Ambulatory outpatient    Location: Office  Delivery: Face-to-face     HPI  Kenneth Osborn, a 67 y.o. year old male, is here today because of his No primary diagnosis found.. Kenneth Osborn's primary complain today is No chief complaint on file.  Pertinent problems: 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 Carolyn Stare at [facility] on 01/04/2013); Neurogenic pain; Thoracic facet syndrome (T8-10); Lumbar facet syndrome (Bilateral) (R>L); Cervical facet syndrome (Right); Cervical spondylosis; Lumbar spondylosis; Chronic upper extremity pain (Left); Chronic cervical radicular pain (Left); Chronic upper back pain; History of thoracic spine surgery (S/P T9-10 IVD spacer); Failed back surgical syndrome; Chronic musculoskeletal pain; Chronic lower extremity pain (Left); Chronic Lumbar radicular pain (Left: L4); Arthralgia of shoulder; Chronic tension-type headache, not intractable; 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); Chronic pain syndrome; Headache disorder; Trigger point posterior superior iliac spine (PSIS)  (Right); 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); Polyneuropathy; Type 2 diabetes mellitus with diabetic polyneuropathy, with long-term current use of insulin (HCC); Spondylosis without myelopathy or radiculopathy, lumbosacral region; Frequent falls; Diabetic polyneuropathy associated with diabetes mellitus due to underlying condition (HCC); Neuropathic pain; Musculoskeletal pain; DDD (degenerative disc disease), lumbosacral; Chronic low back pain (Bilateral) w/o sciatica; Renal mass; 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; Malignant neoplasm of left kidney, except renal pelvis (HCC); Chronic low back pain (Right) w/ sciatica (Right); Chronic lower extremity pain (Right); Chronic Lumbar radicular pain (Right: S1); Epidural fibrosis; Acute pain of left knee; Chronic knee pain (Left); Osteoarthritis of knee (Left); Osteoarthritis of patellofemoral joint (Left); Baker's cyst (tiny) (Left); Lumbar facet joint pain; and Abnormal MRI, lumbar spine (12/24/2013) on their pertinent problem list. Pain Assessment: Severity of   is reported as a  /10. Location:    / . Onset:  . Quality:  . Timing:  . Modifying factor(s):  Marland Kitchen Vitals:  vitals were not taken for this visit.  BMI: Estimated body mass index is 31.01 kg/m as calculated from the following:   Height as of 10/15/22: 5\' 9"  (1.753 m).   Weight as of 10/15/22: 210 lb (95.3 kg). Last encounter: 10/15/2022. Last procedure: 09/24/2022.  Reason for encounter: medication management. ***  Pharmacotherapy Assessment  Analgesic: Oxycodone IR 5 mg, 1 tab PO TID MME/day: 37.5 mg/day.   Monitoring: [place] PMP: PDMP reviewed during this encounter.       Pharmacotherapy: No side-effects or adverse reactions reported. Compliance: No problems identified. Effectiveness: Clinically acceptable.  No notes on file  No results found for: "CBDTHCR" No results found for: "D8THCCBX" No results found for: "D9THCCBX"  UDS:  Summary   Date Value Ref Range Status  09/02/2022 Note  Final    Comment:    ==================================================================== [employer] Select 13 (MW) ====================================================================  Test                             Result       Flag       Units  Drug Present and Declared for Prescription Verification   Desmethyldiazepam              230          EXPECTED   ng/mg creat   Oxazepam                       653          EXPECTED   ng/mg creat   Temazepam                      360          EXPECTED   ng/mg creat    Desmethyldiazepam, oxazepam, and temazepam are expected metabolites    of diazepam. Desmethyldiazepam and oxazepam are also expected    metabolites of other drugs, including chlordiazepoxide, prazepam,    clorazepate, and halazepam. Oxazepam is an expected metabolite of    temazepam. Oxazepam and temazepam are also available as scheduled    prescription medications.    Oxycodone                      587          EXPECTED   ng/mg creat   Oxymorphone                    482          EXPECTED   ng/mg creat   Noroxycodone                   265          EXPECTED   ng/mg creat   Noroxymorphone                 70           EXPECTED   ng/mg creat    Sources of oxycodone are scheduled prescription medications.    Oxymorphone, noroxycodone, and noroxymorphone are expected    metabolites of oxycodone. Oxymorphone is also available as a    scheduled prescription medication.  Drug Absent but Declared for Prescription Verification   Alprazolam                     Not Detected UNEXPECTED ng/mg creat ==================================================================== Test                      Result    Flag   Units      Ref Range   Creatinine              83               mg/dL      >=42 ==================================================================== Declared Medications:  The flagging and interpretation on this report are based on the   following declared medications.  Unexpected results may arise from  inaccuracies in the declared medications.   **Note: The testing scope of this panel includes these medications:   Alprazolam (Xanax)  Diazepam (Valium)  Oxycodone (Roxicodone)   **Note: The testing scope of this panel does not include the  following reported medications:   Aspirin  Cetirizine (Zyrtec)  Cyclobenzaprine (Flexeril)  Diltiazem (Cardizem)  Esomeprazole (Nexium)  Ezetimibe (Zetia)  Gabapentin (Neurontin)  Insulin (Humulin)  Isosorbide (Imdur)  Magnesium (Mag-Ox)  Meclizine (Antivert)  Metformin (Glucophage)  Metoprolol (Toprol)  Montelukast (Singulair)  Naloxone (Narcan)  Nitroglycerin (Nitrostat)  Prasugrel (Effient)  Rosuvastatin (Crestor)  Sennosides (Senokot)  Vitamin B3 ==================================================================== For clinical consultation, please call [phone]. ====================================================================       ROS  Constitutional: Denies any fever or chills Gastrointestinal: No reported hemesis, hematochezia, vomiting, or acute GI distress Musculoskeletal: Denies any acute onset joint swelling, redness, loss of ROM, or weakness Neurological: No reported episodes of acute onset apraxia, aphasia, dysarthria, agnosia, amnesia, paralysis, loss of coordination, or loss of consciousness  Medication Review  ALPRAZolam, Glucagon, Magnesium Oxide, aspirin EC, cetirizine, cyclobenzaprine, diazepam, diltiazem, esomeprazole, ezetimibe, gabapentin, insulin regular human CONCENTRATED, isosorbide mononitrate, meclizine, metFORMIN, metoprolol succinate, montelukast, naloxone, niacin, nitroGLYCERIN, oxyCODONE, prasugrel, rosuvastatin, and senna-docusate  History Review  Allergy: Mr. Biesinger has No Known Allergies. Drug: Mr. Upshur  reports no history of drug use. Alcohol:  reports no history of alcohol use. Tobacco:  reports that he has never  smoked. He has never used smokeless tobacco. Social: Mr. Ebel  reports that he has never smoked. He has never used smokeless tobacco. He reports that he does not drink alcohol and does not use drugs. Medical:  has a past medical history of Acute postoperative pain (12/03/2018), Allergic rhinitis (12/30/2012), Anginal pain (HCC), Anxiety, Bronchitis, Can't get food down (08/12/2014), Cancer (HCC) (01/2020), Chronic back pain, Concussion (09/2015), Coronary artery disease, DDD (degenerative disc disease), cervical, Dehydration symptoms, Diabetes mellitus without complication (HCC), Dysphagia, GERD (gastroesophageal reflux disease), History of Meniere's disease (12/21/2014), History of thoracic spine surgery (S/P T9-10 IVD spacer) (12/21/2014), Hypercholesteremia, Hyperlipidemia, Hypertension, Meniere's disease, Myocardial infarction (HCC) (2012), Neuromuscular disorder (HCC), Renal disorder, Severe sepsis (HCC) (02/07/2020), Short-segment Barrett's esophagus, and Sleep apnea. Surgical: Mr. Jabs  has a past surgical history that includes Cardiovascular stress test; Cardiac catheterization; Labrinthectomy; mastoid shunt (Bilateral); Back surgery; Shoulder arthroscopy with subacromial decompression (Left, 04/06/2012); ARTHRODESIS ANTERIOR ANTERIOR CERVICLE SPINE (01/04/2013); Colonoscopy with propofol (N/A, 09/19/2014); Esophagogastroduodenoscopy (N/A, 09/19/2014); Savory dilation (N/A, 09/19/2014); Spinal cord stimulator implant (Right); Cardiac catheterization (N/A, 06/15/2015); Cardiac catheterization (N/A, 06/15/2015); Pulse generator implant (N/A, 01/18/2019); Lumbar spinal cord simulator lead removal (Right, 08/09/2019); IR Perc Cholecystostomy (02/09/2020); IR Radiologist Eval & Mgmt (03/14/2020); Coronary angioplasty; Appendectomy; Robot assisted laparoscopic nephrectomy (Left, 04/20/2020); and Colonoscopy with propofol (N/A, 04/03/2021). Family: family history includes Cancer in his sister; Diabetes in his maternal  grandmother, mother, and paternal grandmother; Heart disease in his father, maternal aunt, maternal uncle, and mother.  Laboratory Chemistry Profile   Renal Lab Results  Component Value Date   BUN 37 (H) 11/02/2021   CREATININE 2.18 (H) 11/02/2021   GFRAA 58 (L) 01/08/2019   GFRNONAA 32 (L) 11/02/2021    Hepatic Lab Results  Component Value Date   AST 33 11/01/2021   ALT 31 11/01/2021   ALBUMIN 4.0 11/01/2021   ALKPHOS 79 11/01/2021   HCVAB NON REACTIVE 02/08/2020   LIPASE 28 12/11/2020   AMMONIA 24 02/07/2020    Electrolytes Lab Results  Component Value Date   NA 138 11/02/2021   K 5.9 (H) 11/02/2021   CL 107 11/02/2021   CALCIUM 9.8 11/02/2021   MG 1.6 (L) 11/02/2021   PHOS 3.1 11/09/2020    Bone No results found for: "VD25OH", "VD125OH2TOT", "QI6962XB2", "WU1324MW1", "25OHVITD1", "25OHVITD2", "25OHVITD3", "TESTOFREE", "TESTOSTERONE"  Inflammation (CRP: Acute Phase) (ESR: Chronic Phase) Lab Results  Component Value Date  CRP 1.4 (H) 03/22/2015   ESRSEDRATE 9 03/22/2015   LATICACIDVEN 1.5 12/11/2020         Note: Above Lab results reviewed.  Recent Imaging Review  MR LUMBAR SPINE WO CONTRAST CLINICAL DATA:  Low back pain for over 6 weeks  EXAM: MRI LUMBAR SPINE WITHOUT CONTRAST  TECHNIQUE: Multiplanar, multisequence MR imaging of the lumbar spine was performed. No intravenous contrast was administered.  COMPARISON:  03/12/2013  FINDINGS: Segmentation:  Standard.  Alignment:  Physiologic.  Vertebrae: No acute fracture, evidence of discitis, or aggressive bone lesion.  Conus medullaris and cauda equina: Conus extends to the L1 level. Conus and cauda equina appear normal.  Paraspinal and other soft tissues: No acute paraspinal abnormality.  Disc levels:  Disc spaces: Disc desiccation throughout the lumbar spine. Disc heights are maintained.  T12-L1: No significant disc bulge. No neural foraminal stenosis. No central canal stenosis.  L1-L2:  No significant disc bulge. Mild bilateral facet arthropathy. No foraminal or central canal stenosis.  L2-L3: Mild broad-based disc bulge. Mild bilateral facet arthropathy. No foraminal or central canal stenosis.  L3-L4: Mild broad-based disc bulge. Mild bilateral facet arthropathy. No foraminal or central canal stenosis.  L4-L5: Mild broad-based disc bulge. Mild bilateral facet arthropathy. No foraminal or central canal stenosis.  L5-S1: Broad-based disc bulge. No foraminal or central canal stenosis.  IMPRESSION: 1. Mild lumbar spine spondylosis as described above. 2. No acute osseous injury of the lumbar spine.  Electronically Signed   By: Elige Ko M.D.   On: 11/23/2022 09:28 Note: Reviewed        Physical Exam  General appearance: Well nourished, well developed, and well hydrated. In no apparent acute distress Mental status: Alert, oriented x 3 (person, place, & time)       Respiratory: No evidence of acute respiratory distress Eyes: PERLA Vitals: There were no vitals taken for this visit. BMI: Estimated body mass index is 31.01 kg/m as calculated from the following:   Height as of 10/15/22: 5\' 9"  (1.753 m).   Weight as of 10/15/22: 210 lb (95.3 kg). Ideal: Patient weight not recorded  Assessment   Diagnosis Status  1. Cervical facet syndrome (Right)   2. 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 Carolyn Stare at [facility] on 01/04/2013)   3. Thoracic facet syndrome (T8-10)   4. Chronic upper back pain   5. Chronic musculoskeletal pain   6. Failed back surgical syndrome   7. Chronic lower extremity pain (Left)   8. Chronic upper extremity pain (Left)   9. Chronic low back pain (Bilateral) w/o sciatica   10. Chronic pain syndrome   11. Pharmacologic therapy   12. Chronic use of opiate for therapeutic purpose   13. Encounter for medication management   14. Encounter for chronic pain management    Controlled Controlled Controlled   Updated Problems: No  problems updated.  Plan of Care  Problem-specific:  No problem-specific Assessment & Plan notes found for this encounter.  Mr. ROMELIO KLEIN has a current medication list which includes the following long-term medication(s): cetirizine, cyclobenzaprine, esomeprazole, ezetimibe, gabapentin, humulin r u-500 kwikpen, metoprolol succinate, montelukast, niacin, and oxycodone.  Pharmacotherapy (Medications Ordered): No orders of the defined types were placed in this encounter.  Orders:  No orders of the defined types were placed in this encounter.  Follow-up plan:   No follow-ups on file.      Interventional Therapies  Risk Factors  Considerations:   EFFIENT Anticoagulation: (Stop: 7-10 days  Re-start: 6 hrs)  Mnire's disease  OSA  GERD  CAD  (B) Carotid Artery Stenosis  s/p CABG  Hx. Cardiac Syncope  T2IDDM  Hx. MI (06/2010)  Unstable Angina  Lumbar SCS: implanted on 01/18/2019 and removed on 08/09/2019.   Planned  Pending:   Today I have ordered a repeat MRI of the lumbar spine since his last MRI was in 2015 and his condition continues to worsen and he is response to interventional therapies has been somewhat limited. Diagnostic/therapeutic left suprapatellar intra-articular steroid patellofemoral knee injection #1   Under consideration:   Diagnostic/therapeutic left suprapatellar intra-articular steroid patellofemoral knee injection #1 CYP2D6 / CYP3A4 genetic testing.   Completed:   Therapeutic right Racz procedure x1 (12/18/2021) (100/100/70/70-100)  Diagnostic/therapeutic right caudal ESI x1 (11/13/2021)  Palliative right PSIS MNB/TPI x3 (11/13/2017) (100/100/100)  Diagnostic/palliative right lumbar facet MBB x2 (09/22/2018) (100/100/75/75)  Diagnostic/palliative left lumbar facet MBB x2 (09/22/2018) (100/100/75/75)  Palliative left lumbar facet RFA x3 (02/20/2021) (85/85/55/60-70)  Palliative right lumbar facet RFA x3 (01/09/2021) (100/95/50/60-70)    Therapeutic   Palliative (PRN) options:   Palliative right PSIS MNB/TPI  Diagnostic/palliative lumbar facet MBB  Palliative lumbar facet RFA    Pharmacotherapy  Nonopioids transferred 11/22/2019: Magnesium, Flexeril, and Neurontin. Last "Drug Holiday": See 05/06/2022 note.  (To be completed on 05/27/2022)        Recent Visits Date Type Provider Dept  10/15/22 Office Visit Delano Metz, MD [facility]  09/24/22 Procedure visit Delano Metz, MD [facility]  09/02/22 Office Visit Delano Metz, MD [facility]  Showing recent visits within past 90 days and meeting all other requirements Future Appointments Date Type Provider Dept  11/25/22 Appointment Delano Metz, MD [facility]  Showing future appointments within next 90 days and meeting all other requirements  I discussed the assessment and treatment plan with the patient. The patient was provided an opportunity to ask questions and all were answered. The patient agreed with the plan and demonstrated an understanding of the instructions.  Patient advised to call back or seek an in-person evaluation if the symptoms or condition worsens.  Duration of encounter: *** minutes.  Total time on encounter, as per AMA guidelines included both the face-to-face and non-face-to-face time personally spent by the physician and/or other qualified health care professional(s) on the day of the encounter (includes time in activities that require the physician or other qualified health care professional and does not include time in activities normally performed by clinical staff). Physician's time may include the following activities when performed: Preparing to see the patient (e.g., pre-charting review of records, searching for previously ordered imaging, lab work, and nerve conduction tests) Review of prior analgesic pharmacotherapies. Reviewing PMP Interpreting ordered tests (e.g., lab work, imaging, nerve  conduction tests) Performing post-procedure evaluations, including interpretation of diagnostic procedures Obtaining and/or reviewing separately obtained history Performing a medically appropriate examination and/or evaluation Counseling and educating the patient/family/caregiver Ordering medications, tests, or procedures Referring and communicating with other health care professionals (when not separately reported) Documenting clinical information in the electronic or other health record Independently interpreting results (not separately reported) and communicating results to the patient/ family/caregiver Care coordination (not separately reported)  Note by: Oswaldo Done, MD Date: 11/25/2022; Time: 4:31 PM

## 2022-11-24 NOTE — Patient Instructions (Signed)
 ____________________________________________________________________________________________  Opioid Pain Medication Update  To: All patients taking opioid pain medications. (I.e.: hydrocodone, hydromorphone, oxycodone, oxymorphone, morphine, codeine, methadone, tapentadol, tramadol, buprenorphine, fentanyl, etc.)  Re: Updated review of side effects and adverse reactions of opioid analgesics, as well as new information about long term effects of this class of medications.  Direct risks of long-term opioid therapy are not limited to opioid addiction and overdose. Potential medical risks include serious fractures, breathing problems during sleep, hyperalgesia, immunosuppression, chronic constipation, bowel obstruction, myocardial infarction, and tooth decay secondary to xerostomia.  Unpredictable adverse effects that can occur even if you take your medication correctly: Cognitive impairment, respiratory depression, and death. Most people think that if they take their medication "correctly", and "as instructed", that they will be safe. Nothing could be farther from the truth. In reality, a significant amount of recorded deaths associated with the use of opioids has occurred in individuals that had taken the medication for a long time, and were taking their medication correctly. The following are examples of how this can happen: Patient taking his/her medication for a long time, as instructed, without any side effects, is given a certain antibiotic or another unrelated medication, which in turn triggers a "Drug-to-drug interaction" leading to disorientation, cognitive impairment, impaired reflexes, respiratory depression or an untoward event leading to serious bodily harm or injury, including death.  Patient taking his/her medication for a long time, as instructed, without any side effects, develops an acute impairment of liver and/or kidney function. This will lead to a rapid inability of the body to  breakdown and eliminate their pain medication, which will result in effects similar to an "overdose", but with the same medicine and dose that they had always taken. This again may lead to disorientation, cognitive impairment, impaired reflexes, respiratory depression or an untoward event leading to serious bodily harm or injury, including death.  A similar problem will occur with patients as they grow older and their liver and kidney function begins to decrease as part of the aging process.  Background information: Historically, the original case for using long-term opioid therapy to treat chronic noncancer pain was based on safety assumptions that subsequent experience has called into question. In 1996, the American Pain Society and the American Academy of Pain Medicine issued a consensus statement supporting long-term opioid therapy. This statement acknowledged the dangers of opioid prescribing but concluded that the risk for addiction was low; respiratory depression induced by opioids was short-lived, occurred mainly in opioid-naive patients, and was antagonized by pain; tolerance was not a common problem; and efforts to control diversion should not constrain opioid prescribing. This has now proven to be wrong. Experience regarding the risks for opioid addiction, misuse, and overdose in community practice has failed to support these assumptions.  According to the Centers for Disease Control and Prevention, fatal overdoses involving opioid analgesics have increased sharply over the past decade. Currently, more than 96,700 people die from drug overdoses every year. Opioids are a factor in 7 out of every 10 overdose deaths. Deaths from drug overdose have surpassed motor vehicle accidents as the leading cause of death for individuals between the ages of 66 and 14.  Clinical data suggest that neuroendocrine dysfunction may be very common in both men and women, potentially causing hypogonadism, erectile  dysfunction, infertility, decreased libido, osteoporosis, and depression. Recent studies linked higher opioid dose to increased opioid-related mortality. Controlled observational studies reported that long-term opioid therapy may be associated with increased risk for cardiovascular events. Subsequent meta-analysis concluded  that the safety of long-term opioid therapy in elderly patients has not been proven.   Side Effects and adverse reactions: Common side effects: Drowsiness (sedation). Dizziness. Nausea and vomiting. Constipation. Physical dependence -- Dependence often manifests with withdrawal symptoms when opioids are discontinued or decreased. Tolerance -- As you take repeated doses of opioids, you require increased medication to experience the same effect of pain relief. Respiratory depression -- This can occur in healthy people, especially with higher doses. However, people with COPD, asthma or other lung conditions may be even more susceptible to fatal respiratory impairment.  Uncommon side effects: An increased sensitivity to feeling pain and extreme response to pain (hyperalgesia). Chronic use of opioids can lead to this. Delayed gastric emptying (the process by which the contents of your stomach are moved into your small intestine). Muscle rigidity. Immune system and hormonal dysfunction. Quick, involuntary muscle jerks (myoclonus). Arrhythmia. Itchy skin (pruritus). Dry mouth (xerostomia).  Long-term side effects: Chronic constipation. Sleep-disordered breathing (SDB). Increased risk of bone fractures. Hypothalamic-pituitary-adrenal dysregulation. Increased risk of overdose.  RISKS: Respiratory depression and death: Opioids increase the risk of respiratory depression and death.  Drug-to-drug interactions: Opioids are relatively contraindicated in combination with benzodiazepines, sleep inducers, and other central nervous system depressants. Other classes of medications  (i.e.: certain antibiotics and even over-the-counter medications) may also trigger or induce respiratory depression in some patients.  Medical conditions: Patients with pre-existing respiratory problems are at higher risk of respiratory failure and/or depression when in combination with opioid analgesics. Opioids are relatively contraindicated in some medical conditions such as central sleep apnea.   Fractures and Falls:  Opioids increase the risk and incidence of falls. This is of particular importance in elderly patients.  Endocrine System:  Long-term administration is associated with endocrine abnormalities (endocrinopathies). (Also known as Opioid-induced Endocrinopathy) Influences on both the hypothalamic-pituitary-adrenal axis?and the hypothalamic-pituitary-gonadal axis have been demonstrated with consequent hypogonadism and adrenal insufficiency in both sexes. Hypogonadism and decreased levels of dehydroepiandrosterone sulfate have been reported in men and women. Endocrine effects include: Amenorrhoea in women (abnormal absence of menstruation) Reduced libido in both sexes Decreased sexual function Erectile dysfunction in men Hypogonadisms (decreased testicular function with shrinkage of testicles) Infertility Depression and fatigue Loss of muscle mass Anxiety Depression Immune suppression Hyperalgesia Weight gain Anemia Osteoporosis Patients (particularly women of childbearing age) should avoid opioids. There is insufficient evidence to recommend routine monitoring of asymptomatic patients taking opioids in the long-term for hormonal deficiencies.  Immune System: Human studies have demonstrated that opioids have an immunomodulating effect. These effects are mediated via opioid receptors both on immune effector cells and in the central nervous system. Opioids have been demonstrated to have adverse effects on antimicrobial response and anti-tumour surveillance. Buprenorphine has  been demonstrated to have no impact on immune function.  Opioid Induced Hyperalgesia: Human studies have demonstrated that prolonged use of opioids can lead to a state of abnormal pain sensitivity, sometimes called opioid induced hyperalgesia (OIH). Opioid induced hyperalgesia is not usually seen in the absence of tolerance to opioid analgesia. Clinically, hyperalgesia may be diagnosed if the patient on long-term opioid therapy presents with increased pain. This might be qualitatively and anatomically distinct from pain related to disease progression or to breakthrough pain resulting from development of opioid tolerance. Pain associated with hyperalgesia tends to be more diffuse than the pre-existing pain and less defined in quality. Management of opioid induced hyperalgesia requires opioid dose reduction.  Cancer: Chronic opioid therapy has been associated with an increased risk of cancer  among noncancer patients with chronic pain. This association was more evident in chronic strong opioid users. Chronic opioid consumption causes significant pathological changes in the small intestine and colon. Epidemiological studies have found that there is a link between opium dependence and initiation of gastrointestinal cancers. Cancer is the second leading cause of death after cardiovascular disease. Chronic use of opioids can cause multiple conditions such as GERD, immunosuppression and renal damage as well as carcinogenic effects, which are associated with the incidence of cancers.   Mortality: Long-term opioid use has been associated with increased mortality among patients with chronic non-cancer pain (CNCP).  Prescription of long-acting opioids for chronic noncancer pain was associated with a significantly increased risk of all-cause mortality, including deaths from causes other than overdose.  Reference: Von Korff M, Kolodny A, Deyo RA, Chou R. Long-term opioid therapy reconsidered. Ann Intern Med. 2011  Sep 6;155(5):325-8. doi: 10.7326/[id number]. PMID: 16109604; PMCID: [id number]. Randon Goldsmith, Hayward RA, Dunn KM, [place] KP. Risk of adverse events in patients prescribed long-term opioids: A cohort study in the [place] [facility] Research Datalink. Eur J Pain. 2019 May;23(5):908-922. doi: 10.1002/ejp.1357. Epub 2019 Jan 31. PMID: 78295621. Colameco S, Coren JS, Ciervo CA. Continuous opioid treatment for chronic noncancer pain: a time for moderation in prescribing. Postgrad Med. 2009 Jul;121(4):61-6. doi: 10.3810/pgm.2009.07.2032. PMID: 30865784. William Hamburger RN, [place], Blazina I, Cristopher Peru, Bougatsos C, Deyo RA. The effectiveness and risks of long-term opioid therapy for chronic pain: a systematic review for a [employer] of [employer] Pathways to [employer]. Ann Intern Med. 2015 Feb 17;162(4):276-86. doi: 10.7326/M14-2559. PMID: 69629528. Caryl Bis [facility], [place] DM. NCHS Data Brief No. 22. [place]: Centers for Disease Control and Prevention; 2009. Sep, Increase in Fatal Poisonings Involving Opioid Analgesics in the [place], 1999-2006. Song IA, Choi HR, Oh TK. Long-term opioid use and mortality in patients with chronic non-cancer pain: Ten-year follow-up study in [place] from 2010 through 2019. EClinicalMedicine. 2022 Jul 18;51:101558. doi: 10.1016/j.eclinm.2022.413244. PMID: 01027253; PMCID: [id number]. Huser, W., Schubert, T., Vogelmann, T. et al. All-cause mortality in patients with long-term opioid therapy compared with non-opioid analgesics for chronic non-cancer pain: a database study. BMC Med 18, 162 (2020). http://lester.info/ Rashidian H, Karie Kirks, Malekzadeh R, Haghdoost AA. An Ecological Study of the Association between Opiate Use and Incidence of Cancers. Addict Health. 2016 Fall;8(4):252-260. PMID: 59563875; PMCID: [id number].  Our Goal: Our goal is to control your  pain with means other than the use of opioid pain medications.  Our Recommendation: Talk to your physician about coming off of these medications. We can assist you with the tapering down and stopping these medicines. Based on the new information, even if you cannot completely stop the medication, a decrease in the dose may be associated with a lesser risk. Ask for other means of controlling the pain. Decrease or eliminate those factors that significantly contribute to your pain such as smoking, obesity, and a diet heavily tilted towards "inflammatory" nutrients.  Last Updated: 08/26/2022   ____________________________________________________________________________________________     ____________________________________________________________________________________________  National Pain Medication Shortage  The [place] is experiencing worsening drug shortages. These have had a negative widespread effect on patient care and treatment. Not expected to improve any time soon. Predicted to last past 2029.   Drug shortage list (generic names) Oxycodone IR Oxycodone/APAP Oxymorphone IR Hydromorphone Hydrocodone/APAP Morphine  Where is the problem?  Manufacturing and supply level.  Will this shortage affect you?  Only if you  take any of the above pain medications.  How? You may be unable to fill your prescription.  Your pharmacist may offer a "partial fill" of your prescription. (Warning: Do not accept partial fills.) Prescriptions partially filled cannot be transferred to another pharmacy. Read our Medication Rules and Regulation. Depending on how much medicine you are dependent on, you may experience withdrawals when unable to get the medication.  Recommendations: Consider ending your dependence on opioid pain medications. Ask your pain specialist to assist you with the process. Consider switching to a medication currently not in shortage, such as Buprenorphine. Talk to your pain  specialist about this option. Consider decreasing your pain medication requirements by managing tolerance thru "Drug Holidays". This may help minimize withdrawals, should you run out of medicine. Control your pain thru the use of non-pharmacological interventional therapies.   Your prescriber: Prescribers cannot be blamed for shortages. Medication manufacturing and supply issues cannot be fixed by the prescriber.   NOTE: The prescriber is not responsible for supplying the medication, or solving supply issues. Work with your pharmacist to solve it. The patient is responsible for the decision to take or continue taking the medication and for identifying and securing a legal supply source. By law, supplying the medication is the job and responsibility of the pharmacy. The prescriber is responsible for the evaluation, monitoring, and prescribing of these medications.   Prescribers will NOT: Re-issue prescriptions that have been partially filled. Re-issue prescriptions already sent to a pharmacy.  Re-send prescriptions to a different pharmacy because yours did not have your medication. Ask pharmacist to order more medicine or transfer the prescription to another pharmacy. (Read below.)  New 2023 regulation: "October 19, 2021 Revised Regulation Allows DEA-Registered Pharmacies to Transfer Electronic Prescriptions at a Patient's Request DEA Headquarters Division - Public Information Office Patients now have the ability to request their electronic prescription be transferred to another pharmacy without having to go back to their practitioner to initiate the request. This revised regulation went into effect on Monday, October 15, 2021.     At a patient's request, a DEA-registered retail pharmacy can now transfer an electronic prescription for a controlled substance (schedules II-V) to another DEA-registered retail pharmacy. Prior to this change, patients would have to go through their practitioner to  cancel their prescription and have it re-issued to a different pharmacy. The process was taxing and time consuming for both patients and practitioners.    The Drug Enforcement Administration [facility]) published its intent to revise the process for transferring electronic prescriptions on January 07, 2020.  The final rule was published in the federal register on September 13, 2021 and went into effect 30 days later.  Under the final rule, a prescription can only be transferred once between pharmacies, and only if allowed under existing state or other applicable law. The prescription must remain in its electronic form; may not be altered in any way; and the transfer must be communicated directly between two licensed pharmacists. It's important to note, any authorized refills transfer with the original prescription, which means the entire prescription will be filled at the same pharmacy".  Reference: [email or web address] [facility] website announcement)  [email or web address] [employer] of [employer])   [employer] / Vol. 88, No. 143 / Thursday, September 13, 2021 / Rules and Regulations DEPARTMENT OF JUSTICE  Drug Enforcement Administration  21 CFR Part 1306  [Docket No. [id number]]  RIN [id number] Transfer of Electronic Prescriptions for Schedules II-V Controlled Substances Between Pharmacies for Initial Filling  ____________________________________________________________________________________________  ____________________________________________________________________________________________  Transfer of Pain Medication between Pharmacies  Re: 2023 DEA Clarification on existing regulation  Published on DEA Website: October 19, 2021  Title: Revised Regulation Allows DEA-Registered Pharmacies to Electrical engineer Prescriptions at a Patient's  Request DEA Headquarters Division - [employer]  "Patients now have the ability to request their electronic prescription be transferred to another pharmacy without having to go back to their practitioner to initiate the request. This revised regulation went into effect on Monday, October 15, 2021.     At a patient's request, a DEA-registered retail pharmacy can now transfer an electronic prescription for a controlled substance (schedules II-V) to another DEA-registered retail pharmacy. Prior to this change, patients would have to go through their practitioner to cancel their prescription and have it re-issued to a different pharmacy. The process was taxing and time consuming for both patients and practitioners.    The Drug Enforcement Administration [facility]) published its intent to revise the process for transferring electronic prescriptions on January 07, 2020.  The final rule was published in the federal register on September 13, 2021 and went into effect 30 days later.  Under the final rule, a prescription can only be transferred once between pharmacies, and only if allowed under existing state or other applicable law. The prescription must remain in its electronic form; may not be altered in any way; and the transfer must be communicated directly between two licensed pharmacists. It's important to note, any authorized refills transfer with the original prescription, which means the entire prescription will be filled at the same pharmacy."    REFERENCES: 1. DEA website announcement [email or web address]  2. Department of Justice website  [email or web address].pdf  3. DEPARTMENT OF JUSTICE Drug Enforcement Administration 21 CFR Part 1306 [Docket No. [id number]] RIN [id number] "Transfer of Electronic Prescriptions for Schedules II-V Controlled Substances  Between Pharmacies for Initial Filling"  ____________________________________________________________________________________________     _______________________________________________________________________  Medication Rules  Purpose: To inform patients, and their family members, of our medication rules and regulations.  Applies to: All patients receiving prescriptions from our practice (written or electronic).  Pharmacy of record: This is the pharmacy where your electronic prescriptions will be sent. Make sure we have the correct one.  Electronic prescriptions: In compliance with the [facility] Strengthen Opioid Misuse Prevention (STOP) Act of 2017 (Session Conni Elliot [phone]), effective February 18, 2018, all controlled substances must be electronically prescribed. Written prescriptions, faxing, or calling prescriptions to a pharmacy will no longer be done.  Prescription refills: These will be provided only during in-person appointments. No medications will be renewed without a "face-to-face" evaluation with your provider. Applies to all prescriptions.  NOTE: The following applies primarily to controlled substances (Opioid* Pain Medications).   Type of encounter (visit): For patients receiving controlled substances, face-to-face visits are required. (Not an option and not up to the patient.)  Patient's responsibilities: Pain Pills: Bring all pain pills to every appointment (except for procedure appointments). Pill Bottles: Bring pills in original pharmacy bottle. Bring bottle, even if empty. Always bring the bottle of the most recent fill.  Medication refills: You are responsible for knowing and keeping track of what medications you are taking and when is it that you will need a refill. The day before your appointment: write a list of all prescriptions that need to be refilled. The day of the appointment: give the list to the admitting nurse. Prescriptions will be written only  during appointments. No prescriptions will be written on procedure days. If you forget a  medication: it will not be "Called in", "Faxed", or "electronically sent". You will need to get another appointment to get these prescribed. No early refills. Do not call asking to have your prescription filled early. Partial  or short prescriptions: Occasionally your pharmacy may not have enough pills to fill your prescription.  NEVER ACCEPT a partial fill or a prescription that is short of the total amount of pills that you were prescribed.  With controlled substances the law allows 72 hours for the pharmacy to complete the prescription.  If the prescription is not completed within 72 hours, the pharmacist will require a new prescription to be written. This means that you will be short on your medicine and we WILL NOT send another prescription to complete your original prescription.  Instead, request the pharmacy to send a carrier to a nearby branch to get enough medication to provide you with your full prescription. Prescription Accuracy: You are responsible for carefully inspecting your prescriptions before leaving our office. Have the discharge nurse carefully go over each prescription with you, before taking them home. Make sure that your name is accurately spelled, that your address is correct. Check the name and dose of your medication to make sure it is accurate. Check the number of pills, and the written instructions to make sure they are clear and accurate. Make sure that you are given enough medication to last until your next medication refill appointment. Taking Medication: Take medication as prescribed. When it comes to controlled substances, taking less pills or less frequently than prescribed is permitted and encouraged. Never take more pills than instructed. Never take the medication more frequently than prescribed.  Inform other Doctors: Always inform, all of your healthcare providers, of all the  medications you take. Pain Medication from other Providers: You are not allowed to accept any additional pain medication from any other Doctor or Healthcare provider. There are two exceptions to this rule. (see below) In the event that you require additional pain medication, you are responsible for notifying us, as stated below. Cough Medicine: Often these contain an opioid, such as codeine or hydrocodone. Never accept or take cough medicine containing these opioids if you are already taking an opioid* medication. The combination may cause respiratory failure and death. Medication Agreement: You are responsible for carefully reading and following our Medication Agreement. This must be signed before receiving any prescriptions from our practice. Safely store a copy of your signed Agreement. Violations to the Agreement will result in no further prescriptions. (Additional copies of our Medication Agreement are available upon request.) Laws, Rules, & Regulations: All patients are expected to follow all [address] and [employer] Industries, Rules, [employer]. Ignorance of the Laws does not constitute a valid excuse.  Illegal drugs and Controlled Substances: The use of illegal substances (including, but not limited to marijuana and its derivatives) and/or the illegal use of any controlled substances is strictly prohibited. Violation of this rule may result in the immediate and permanent discontinuation of any and all prescriptions being written by our practice. The use of any illegal substances is prohibited. Adopted CDC guidelines & recommendations: Target dosing levels will be at or below 60 MME/day. Use of benzodiazepines** is not recommended.  Exceptions: There are only two exceptions to the rule of not receiving pain medications from other Healthcare Providers. Exception #1 (Emergencies): In the event of an emergency (i.e.: accident requiring emergency care), you are allowed to receive additional pain  medication. However, you are responsible for: As soon as  you are able, call our office [phone], at any time of the day or night, and leave a message stating your name, the date and nature of the emergency, and the name and dose of the medication prescribed. In the event that your call is answered by a member of our staff, make sure to document and save the date, time, and the name of the person that took your information.  Exception #2 (Planned Surgery): In the event that you are scheduled by another doctor or dentist to have any type of surgery or procedure, you are allowed (for a period no longer than 30 days), to receive additional pain medication, for the acute post-op pain. However, in this case, you are responsible for picking up a copy of our "Post-op Pain Management for Surgeons" handout, and giving it to your surgeon or dentist. This document is available at our office, and does not require an appointment to obtain it. Simply go to our office during business hours (Monday-Thursday from 8:00 AM to 4:00 PM) (Friday 8:00 AM to 12:00 Noon) or if you have a scheduled appointment with [name], prior to your surgery, and ask for it by name. In addition, you are responsible for: calling our office [phone], at any time of the day or night, and leaving a message stating your name, name of your surgeon, type of surgery, and date of procedure or surgery. Failure to comply with your responsibilities may result in termination of therapy involving the controlled substances. Medication Agreement Violation. Following the above rules, including your responsibilities will help you in avoiding a Medication Agreement Violation ("Breaking your Pain Medication Contract").  Consequences:  Not following the above rules may result in permanent discontinuation of medication prescription therapy.  *Opioid medications include: morphine, codeine, oxycodone, oxymorphone, hydrocodone, hydromorphone, meperidine, tramadol,  tapentadol, buprenorphine, fentanyl, methadone. **Benzodiazepine medications include: diazepam (Valium), alprazolam (Xanax), clonazepam (Klonopine), lorazepam (Ativan), clorazepate (Tranxene), chlordiazepoxide (Librium), estazolam (Prosom), oxazepam (Serax), temazepam (Restoril), triazolam (Halcion) (Last updated: 12/11/2021) ______________________________________________________________________    ______________________________________________________________________  Medication Recommendations and Reminders  Applies to: All patients receiving prescriptions (written and/or electronic).  Medication Rules & Regulations: You are responsible for reading, knowing, and following our "Medication Rules" document. These exist for your safety and that of others. They are not flexible and neither are we. Dismissing or ignoring them is an act of "non-compliance" that may result in complete and irreversible termination of such medication therapy. For safety reasons, "non-compliance" will not be tolerated. As with the [place] fundamental legal principle of "ignorance of the law is no defense", we will accept no excuses for not having read and knowing the content of documents provided to you by our practice.  Pharmacy of record:  Definition: This is the pharmacy where your electronic prescriptions will be sent.  We do not endorse any particular pharmacy. It is up to you and your insurance to decide what pharmacy to use.  We do not restrict you in your choice of pharmacy. However, once we write for your prescriptions, we will NOT be re-sending more prescriptions to fix restricted supply problems created by your pharmacy, or your insurance.  The pharmacy listed in the electronic medical record should be the one where you want electronic prescriptions to be sent. If you choose to change pharmacy, simply notify our nursing staff. Changes will be made only during your regular appointments and not over the  phone.  Recommendations: Keep all of your pain medications in a safe place, under lock and key, even  if you live alone. We will NOT replace lost, stolen, or damaged medication. We do not accept "Police Reports" as proof of medications having been stolen. After you fill your prescription, take 1 week's worth of pills and put them away in a safe place. You should keep a separate, properly labeled bottle for this purpose. The remainder should be kept in the original bottle. Use this as your primary supply, until it runs out. Once it's gone, then you know that you have 1 week's worth of medicine, and it is time to come in for a prescription refill. If you do this correctly, it is unlikely that you will ever run out of medicine. To make sure that the above recommendation works, it is very important that you make sure your medication refill appointments are scheduled at least 1 week before you run out of medicine. To do this in an effective manner, make sure that you do not leave the office without scheduling your next medication management appointment. Always ask the nursing staff to show you in your prescription , when your medication will be running out. Then arrange for the receptionist to get you a return appointment, at least 7 days before you run out of medicine. Do not wait until you have 1 or 2 pills left, to come in. This is very poor planning and does not take into consideration that we may need to cancel appointments due to bad weather, sickness, or emergencies affecting our staff. DO NOT ACCEPT A "Partial Fill": If for any reason your pharmacy does not have enough pills/tablets to completely fill or refill your prescription, do not allow for a "partial fill". The law allows the pharmacy to complete that prescription within 72 hours, without requiring a new prescription. If they do not fill the rest of your prescription within those 72 hours, you will need a separate prescription to fill the remaining  amount, which we will NOT provide. If the reason for the partial fill is your insurance, you will need to talk to the pharmacist about payment alternatives for the remaining tablets, but again, DO NOT ACCEPT A PARTIAL FILL, unless you can trust your pharmacist to obtain the remainder of the pills within 72 hours.  Prescription refills and/or changes in medication(s):  Prescription refills, and/or changes in dose or medication, will be conducted only during scheduled medication management appointments. (Applies to both, written and electronic prescriptions.) No refills on procedure days. No medication will be changed or started on procedure days. No changes, adjustments, and/or refills will be conducted on a procedure day. Doing so will interfere with the diagnostic portion of the procedure. No phone refills. No medications will be "called into the pharmacy". No Fax refills. No weekend refills. No Holliday refills. No after hours refills.  Remember:  Business hours are:  Monday to Thursday 8:00 AM to 4:00 PM Provider's Schedule: Delano Metz, MD - Appointments are:  Medication management: Monday and Wednesday 8:00 AM to 4:00 PM Procedure day: Tuesday and Thursday 7:30 AM to 4:00 PM Edward Jolly, MD - Appointments are:  Medication management: Tuesday and Thursday 8:00 AM to 4:00 PM Procedure day: Monday and Wednesday 7:30 AM to 4:00 PM (Last update: 12/11/2021) ______________________________________________________________________    ____________________________________________________________________________________________  Drug Holidays  What is a "Drug Holiday"? Drug Holiday: is the name given to the process of slowly tapering down and temporarily stopping the pain medication for the purpose of decreasing or eliminating tolerance to the drug.  Benefits Improved effectiveness Decreased required effective  dose Improved pain control End dependence on high dose  therapy Decrease cost of therapy Uncovering "opioid-induced hyperalgesia". (OIH)  What is "opioid hyperalgesia"? It is a paradoxical increase in pain caused by exposure to opioids. Stopping the opioid pain medication, contrary to the expected, it actually decreases or completely eliminates the pain. Ref.: "A comprehensive review of opioid-induced hyperalgesia". Donney Rankins, et.al. Pain Physician. 2011 Mar-Apr;14(2):145-61.  What is tolerance? Tolerance: the progressive loss of effectiveness of a pain medicine due to repetitive use. A common problem of opioid pain medications.  How long should a "Drug Holiday" last? Effectiveness depends on the patient staying off all opioid pain medicines for a minimum of 14 consecutive days. (2 weeks)  How about just taking less of the medicine? Does not work. Will not accomplish goal of eliminating the excess receptors.  How about switching to a different pain medicine? (AKA. "Opioid rotation") Does not work. Creates the illusion of effectiveness by taking advantage of inaccurate equivalent dose calculations between different opioids. -This "technique" was promoted by studies funded by [employer], such as [employer], creators of "OxyContin".  Can I stop the medicine "cold [place]"? We do not recommend it. You should always coordinate with your prescribing physician to make the transition as smoothly as possible. Avoid stopping the medicine abruptly without consulting. We recommend a "slow taper".  What is a slow taper? Taper: refers to the gradual decrease in dose.   How do I stop/taper the dose? Slowly. Decrease the daily amount of pills that you take by one (1) pill every seven (7) days. This is called a "slow downward taper". Example: if you normally take four (4) pills per day, drop it to three (3) pills per day for seven (7) days, then to two (2) pills per day for seven (7) days, then to one (1) per day for seven (7) days, and then  stop the medicine. The 14 day "Drug Holiday" starts on the first day without medicine.   Will I experience withdrawals? Unlikely with a slow taper.  What triggers withdrawals? Withdrawals are triggered by the sudden/abrupt stop of high dose opioids. Withdrawals can be avoided by slowly decreasing the dose over a prolonged period of time.  What are withdrawals? Symptoms associated with sudden/abrupt reduction/stopping of high-dose, long-term use of pain medication. Withdrawal are seldom seen on low dose therapy, or patients rarely taking opioid medication.  Early Withdrawal Symptoms may include: Agitation Anxiety Muscle aches Increased tearing Insomnia Runny nose Sweating Yawning  Late symptoms may include: Abdominal cramping Diarrhea Dilated pupils Goose bumps Nausea Vomiting  When could I see withdrawals? Onset: 8-24 hours after last use for most opioids. 12-48 hours for long-acting opioids (i.e.: methadone)  How long could they last? Duration: 4-10 days for most opioids. 14-21 days for long-acting opioids (i.e.: methadone)  What will happen after I complete my "Drug Holiday"? The need and indications for the opioid analgesic will be reviewed before restarting the medication. Dose requirements will likely decrease and the dose will need to be adjusted accordingly.   (Last update: 05/08/2022) ____________________________________________________________________________________________   ____________________________________________________________________________________________  Naloxone Nasal Spray  Why am I receiving this medication? [place] STOP ACT requires that all patients taking high dose opioids or at risk of opioids respiratory depression, be prescribed an opioid reversal agent, such as Naloxone (AKA: Narcan).  What is this medication? NALOXONE (nal OX one) treats opioid overdose, which causes slow or shallow breathing, severe drowsiness, or trouble  staying awake. Call emergency services after using  this medication. You may need additional treatment. Naloxone works by reversing the effects of opioids. It belongs to a group of medications called opioid blockers.  COMMON BRAND NAME(S): Kloxxado, Narcan  What should I tell my care team before I take this medication? They need to know if you have any of these conditions: Heart disease Substance use disorder An unusual or allergic reaction to naloxone, other medications, foods, dyes, or preservatives Pregnant or trying to get pregnant Breast-feeding  When to use this medication? This medication is to be used for the treatment of respiratory depression (less than 8 breaths per minute) secondary to opioid overdose.   How to use this medication? This medication is for use in the nose. Lay the person on their back. Support their neck with your hand and allow the head to tilt back before giving the medication. The nasal spray should be given into 1 nostril. After giving the medication, move the person onto their side. Do not remove or test the nasal spray until ready to use. Get emergency medical help right away after giving the first dose of this medication, even if the person wakes up. You should be familiar with how to recognize the signs and symptoms of a narcotic overdose. If more doses are needed, give the additional dose in the other nostril. Talk to your care team about the use of this medication in children. While this medication may be prescribed for children as young as newborns for selected conditions, precautions do apply.  Naloxone Overdosage: If you think you have taken too much of this medicine contact a poison control center or emergency room at once.  NOTE: This medicine is only for you. Do not share this medicine with others.  What if I miss a dose? This does not apply.  What may interact with this medication? This is only used during an emergency. No interactions are  expected during emergency use. This list may not describe all possible interactions. Give your health care provider a list of all the medicines, herbs, non-prescription drugs, or dietary supplements you use. Also tell them if you smoke, drink alcohol, or use illegal drugs. Some items may interact with your medicine.  What should I watch for while using this medication? Keep this medication ready for use in the case of an opioid overdose. Make sure that you have the phone number of your care team and local hospital ready. You may need to have additional doses of this medication. Each nasal spray contains a single dose. Some emergencies may require additional doses. After use, bring the treated person to the nearest hospital or call 911. Make sure the treating care team knows that the person has received a dose of this medication. You will receive additional instructions on what to do during and after use of this medication before an emergency occurs.  What side effects may I notice from receiving this medication? Side effects that you should report to your care team as soon as possible: Allergic reactions--skin rash, itching, hives, swelling of the face, lips, tongue, or throat Side effects that usually do not require medical attention (report these to your care team if they continue or are bothersome): Constipation Dryness or irritation inside the nose Headache Increase in blood pressure Muscle spasms Stuffy nose Toothache This list may not describe all possible side effects. Call your doctor for medical advice about side effects. You may report side effects to FDA at 1-800-FDA-1088.  Where should I keep my medication? Because this is  an emergency medication, you should keep it with you at all times.  Keep out of the reach of children and pets. Store between 20 and 25 degrees C (68 and 77 degrees F). Do not freeze. Throw away any unused medication after the expiration date. Keep in original  box until ready to use.  NOTE: This sheet is a summary. It may not cover all possible information. If you have questions about this medicine, talk to your doctor, pharmacist, or health care provider.   2023 Elsevier/Gold Standard (2020-10-13 00:00:00)  ____________________________________________________________________________________________

## 2022-11-25 ENCOUNTER — Encounter: Payer: Self-pay | Admitting: Pain Medicine

## 2022-11-25 ENCOUNTER — Ambulatory Visit: Payer: Medicare Other | Attending: Pain Medicine | Admitting: Pain Medicine

## 2022-11-25 VITALS — BP 95/59 | HR 83 | Temp 97.3°F | Ht 69.0 in | Wt 211.0 lb

## 2022-11-25 DIAGNOSIS — M47894 Other spondylosis, thoracic region: Secondary | ICD-10-CM | POA: Diagnosis present

## 2022-11-25 DIAGNOSIS — M545 Low back pain, unspecified: Secondary | ICD-10-CM | POA: Insufficient documentation

## 2022-11-25 DIAGNOSIS — M549 Dorsalgia, unspecified: Secondary | ICD-10-CM | POA: Insufficient documentation

## 2022-11-25 DIAGNOSIS — M961 Postlaminectomy syndrome, not elsewhere classified: Secondary | ICD-10-CM

## 2022-11-25 DIAGNOSIS — M47812 Spondylosis without myelopathy or radiculopathy, cervical region: Secondary | ICD-10-CM | POA: Diagnosis present

## 2022-11-25 DIAGNOSIS — M7918 Myalgia, other site: Secondary | ICD-10-CM

## 2022-11-25 DIAGNOSIS — G894 Chronic pain syndrome: Secondary | ICD-10-CM

## 2022-11-25 DIAGNOSIS — Z79891 Long term (current) use of opiate analgesic: Secondary | ICD-10-CM | POA: Diagnosis present

## 2022-11-25 DIAGNOSIS — M79602 Pain in left arm: Secondary | ICD-10-CM | POA: Diagnosis present

## 2022-11-25 DIAGNOSIS — M79605 Pain in left leg: Secondary | ICD-10-CM | POA: Insufficient documentation

## 2022-11-25 DIAGNOSIS — G8929 Other chronic pain: Secondary | ICD-10-CM | POA: Diagnosis present

## 2022-11-25 DIAGNOSIS — Z79899 Other long term (current) drug therapy: Secondary | ICD-10-CM

## 2022-11-25 MED ORDER — NALOXONE HCL 4 MG/0.1ML NA LIQD: 1.0000 | NASAL | 0 refills | Status: DC | PRN

## 2022-11-25 MED ORDER — OXYCODONE HCL 5 MG PO TABS: 5.0000 mg | ORAL_TABLET | Freq: Three times a day (TID) | ORAL | 0 refills | Status: DC

## 2022-11-25 NOTE — Progress Notes (Signed)
Safety precautions to be maintained throughout the outpatient stay will include: orient to surroundings, keep bed in low position, maintain call bell within reach at all times, provide assistance with transfer out of bed and ambulation.   Nursing Pain Medication Assessment:  Safety precautions to be maintained throughout the outpatient stay will include: orient to surroundings, keep bed in low position, maintain call bell within reach at all times, provide assistance with transfer out of bed and ambulation.  Medication Inspection Compliance: Kenneth Osborn did not comply with our request to bring his pills to be counted. He was reminded that bringing the medication bottles, even when empty, is a requirement.  Medication: Oxycodone IR Pill/Patch Count: No pills available to be counted. Pill/Patch Appearance: No markings Bottle Appearance: No container available. Did not bring bottle(s) to appointment. Filled Date: 33 / 3 / 2024 Last Medication intake:  Today

## 2022-12-16 NOTE — Progress Notes (Unsigned)
PROVIDER NOTE: Information contained herein reflects review and annotations entered in association with encounter. Interpretation of such information and data should be left to medically-trained personnel. Information provided to patient can be located elsewhere in the medical record under "Patient Instructions". Document created using STT-dictation technology, any transcriptional errors that may result from process are unintentional.    Patient: Kenneth Osborn  Service Category: E/M  Provider: Oswaldo Done, MD  DOB: 09/25/1955  DOS: 12/18/2022  Referring Provider: Jerl Mina, MD  MRN: [id number]  Specialty: Interventional Pain Management  PCP: Jerl Mina, MD  Type: Established Patient  Setting: Ambulatory outpatient    Location: Office  Delivery: Face-to-face     HPI  [name], a 67 y.o. year old male, is here today because of his No primary diagnosis found.. Kenneth Osborn's primary complain today is No chief complaint on file.  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 [facility] on 01/04/2013); Neurogenic pain; Thoracic facet syndrome (T8-10); Lumbar facet syndrome (Bilateral) (R>L); Cervical facet syndrome (Right); Cervical spondylosis; Lumbar spondylosis; Chronic upper extremity pain (Left); Chronic cervical radicular pain (Left); Chronic upper back pain; History of thoracic spine surgery (S/P T9-10 IVD spacer); Failed back surgical syndrome; Chronic musculoskeletal pain; Chronic lower extremity pain (Left); Chronic Lumbar radicular pain (Left: L4); Arthralgia of shoulder; Chronic tension-type headache, not intractable; 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); Chronic pain syndrome; Headache disorder; Trigger point posterior superior iliac spine (PSIS)  (Right); 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); Polyneuropathy; Type 2 diabetes mellitus with diabetic polyneuropathy, with long-term current use of insulin (HCC); Spondylosis without myelopathy or radiculopathy, lumbosacral region; Frequent falls; Diabetic polyneuropathy associated with diabetes mellitus due to underlying condition (HCC); Neuropathic pain; Musculoskeletal pain; DDD (degenerative disc disease), lumbosacral; Chronic low back pain (Bilateral) w/o sciatica; Renal mass; 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; Malignant neoplasm of left kidney, except renal pelvis (HCC); Chronic low back pain (Right) w/ sciatica (Right); Chronic lower extremity pain (Right); Chronic Lumbar radicular pain (Right: S1); Epidural fibrosis; Acute pain of left knee; Chronic knee pain (Left); Osteoarthritis of knee (Left); Osteoarthritis of patellofemoral joint (Left); Baker's cyst (tiny) (Left); Lumbar facet joint pain; and Abnormal MRI, lumbar spine (12/24/2013) on their pertinent problem list. Pain Assessment: Severity of   is reported as a  /10. Location:    / . Onset:  . Quality:  . Timing:  . Modifying factor(s):  Marland Kitchen Vitals:  vitals were not taken for this visit.  BMI: Estimated body mass index is 31.16 kg/m as calculated from the following:   Height as of 11/25/22: 5\' 9"  (1.753 m).   Weight as of 11/25/22: 211 lb (95.7 kg). Last encounter: 11/25/2022. Last procedure: 09/24/2022.  Reason for encounter:  *** . ***  Pharmacotherapy Assessment  Analgesic: Oxycodone IR 5 mg, 1 tab PO TID MME/day: 37.5 mg/day.   Monitoring: [place] PMP: PDMP reviewed during this encounter.       Pharmacotherapy: No side-effects or adverse reactions reported. Compliance: No problems identified. Effectiveness: Clinically acceptable.  No notes on file  No results found for: "CBDTHCR" No results found for: "D8THCCBX" No results found for: "D9THCCBX"  UDS:  Summary  Date Value Ref  Range Status  09/02/2022 Note  Final    Comment:    ==================================================================== [employer] [facility] [employer] (  MW) ==================================================================== Test                             Result       Flag       Units  Drug Present and Declared for Prescription Verification   Desmethyldiazepam              230          EXPECTED   ng/mg creat   Oxazepam                       653          EXPECTED   ng/mg creat   Temazepam                      360          EXPECTED   ng/mg creat    Desmethyldiazepam, oxazepam, and temazepam are expected metabolites    of diazepam. Desmethyldiazepam and oxazepam are also expected    metabolites of other drugs, including chlordiazepoxide, prazepam,    clorazepate, and halazepam. Oxazepam is an expected metabolite of    temazepam. Oxazepam and temazepam are also available as scheduled    prescription medications.    Oxycodone                      587          EXPECTED   ng/mg creat   Oxymorphone                    482          EXPECTED   ng/mg creat   Noroxycodone                   265          EXPECTED   ng/mg creat   Noroxymorphone                 70           EXPECTED   ng/mg creat    Sources of oxycodone are scheduled prescription medications.    Oxymorphone, noroxycodone, and noroxymorphone are expected    metabolites of oxycodone. Oxymorphone is also available as a    scheduled prescription medication.  Drug Absent but Declared for Prescription Verification   Alprazolam                     Not Detected UNEXPECTED ng/mg creat ==================================================================== Test                      Result    Flag   Units      Ref Range   Creatinine              83               mg/dL      >=16 ==================================================================== Declared Medications:  The flagging and interpretation on this report are based on the  following declared  medications.  Unexpected results may arise from  inaccuracies in the declared medications.   **Note: The testing scope of this panel includes these medications:   Alprazolam (Xanax)  Diazepam (Valium)  Oxycodone (Roxicodone)   **Note: The testing scope of this panel does not include the  following reported medications:   Aspirin  Cetirizine (Zyrtec)  Cyclobenzaprine (  Flexeril)  Diltiazem (Cardizem)  Esomeprazole (Nexium)  Ezetimibe (Zetia)  Gabapentin (Neurontin)  Insulin (Humulin)  Isosorbide (Imdur)  Magnesium (Mag-Ox)  Meclizine (Antivert)  Metformin (Glucophage)  Metoprolol (Toprol)  Montelukast (Singulair)  Naloxone (Narcan)  Nitroglycerin (Nitrostat)  Prasugrel (Effient)  Rosuvastatin (Crestor)  Sennosides (Senokot)  Vitamin B3 ==================================================================== For clinical consultation, please call [phone]. ====================================================================       ROS  Constitutional: Denies any fever or chills Gastrointestinal: No reported hemesis, hematochezia, vomiting, or acute GI distress Musculoskeletal: Denies any acute onset joint swelling, redness, loss of ROM, or weakness Neurological: No reported episodes of acute onset apraxia, aphasia, dysarthria, agnosia, amnesia, paralysis, loss of coordination, or loss of consciousness  Medication Review  ALPRAZolam, Glucagon, Magnesium Oxide, aspirin EC, cetirizine, chlorthalidone, cyclobenzaprine, diazepam, diltiazem, empagliflozin, esomeprazole, ezetimibe, gabapentin, insulin regular human CONCENTRATED, isosorbide mononitrate, latanoprost, meclizine, metFORMIN, metoprolol succinate, montelukast, naloxone, niacin, nitroGLYCERIN, oxyCODONE, prasugrel, rosuvastatin, and senna-docusate  History Review  Allergy: Kenneth Osborn has No Known Allergies. Drug: Kenneth Osborn  reports no history of drug use. Alcohol:  reports no history of alcohol use. Tobacco:   reports that he has never smoked. He has never used smokeless tobacco. Social: Kenneth Osborn  reports that he has never smoked. He has never used smokeless tobacco. He reports that he does not drink alcohol and does not use drugs. Medical:  has a past medical history of Acute postoperative pain (12/03/2018), Allergic rhinitis (12/30/2012), Anginal pain (HCC), Anxiety, Bronchitis, Can't get food down (08/12/2014), Cancer (HCC) (01/2020), Chronic back pain, Concussion (09/2015), Coronary artery disease, DDD (degenerative disc disease), cervical, Dehydration symptoms, Diabetes mellitus without complication (HCC), Dysphagia, GERD (gastroesophageal reflux disease), History of Meniere's disease (12/21/2014), History of thoracic spine surgery (S/P T9-10 IVD spacer) (12/21/2014), Hypercholesteremia, Hyperlipidemia, Hypertension, Meniere's disease, Myocardial infarction (HCC) (2012), Neuromuscular disorder (HCC), Renal disorder, Severe sepsis (HCC) (02/07/2020), Short-segment Barrett's esophagus, and Sleep apnea. Surgical: Kenneth Osborn  has a past surgical history that includes Cardiovascular stress test; Cardiac catheterization; Labrinthectomy; mastoid shunt (Bilateral); Back surgery; Shoulder arthroscopy with subacromial decompression (Left, 04/06/2012); ARTHRODESIS ANTERIOR ANTERIOR CERVICLE SPINE (01/04/2013); Colonoscopy with propofol (N/A, 09/19/2014); Esophagogastroduodenoscopy (N/A, 09/19/2014); Savory dilation (N/A, 09/19/2014); Spinal cord stimulator implant (Right); Cardiac catheterization (N/A, 06/15/2015); Cardiac catheterization (N/A, 06/15/2015); Pulse generator implant (N/A, 01/18/2019); Lumbar spinal cord simulator lead removal (Right, 08/09/2019); IR Perc Cholecystostomy (02/09/2020); IR Radiologist Eval & Mgmt (03/14/2020); Coronary angioplasty; Appendectomy; Robot assisted laparoscopic nephrectomy (Left, 04/20/2020); and Colonoscopy with propofol (N/A, 04/03/2021). Family: family history includes Cancer in his sister;  Diabetes in his maternal grandmother, mother, and paternal grandmother; Heart disease in his father, maternal aunt, maternal uncle, and mother.  Laboratory Chemistry Profile   Renal Lab Results  Component Value Date   BUN 37 (H) 11/02/2021   CREATININE 2.18 (H) 11/02/2021   GFRAA 58 (L) 01/08/2019   GFRNONAA 32 (L) 11/02/2021    Hepatic Lab Results  Component Value Date   AST 33 11/01/2021   ALT 31 11/01/2021   ALBUMIN 4.0 11/01/2021   ALKPHOS 79 11/01/2021   HCVAB NON REACTIVE 02/08/2020   LIPASE 28 12/11/2020   AMMONIA 24 02/07/2020    Electrolytes Lab Results  Component Value Date   NA 138 11/02/2021   K 5.9 (H) 11/02/2021   CL 107 11/02/2021   CALCIUM 9.8 11/02/2021   MG 1.6 (L) 11/02/2021   PHOS 3.1 11/09/2020    Bone No results found for: "VD25OH", "VD125OH2TOT", "QM5784ON6", "EX5284XL2", "25OHVITD1", "25OHVITD2", "25OHVITD3", "TESTOFREE", "TESTOSTERONE"  Inflammation (CRP: Acute Phase) (ESR: Chronic Phase) Lab  Results  Component Value Date   CRP 1.4 (H) 03/22/2015   ESRSEDRATE 9 03/22/2015   LATICACIDVEN 1.5 12/11/2020         Note: Above Lab results reviewed.  Recent Imaging Review  MR LUMBAR SPINE WO CONTRAST CLINICAL DATA:  Low back pain for over 6 weeks  EXAM: MRI LUMBAR SPINE WITHOUT CONTRAST  TECHNIQUE: Multiplanar, multisequence MR imaging of the lumbar spine was performed. No intravenous contrast was administered.  COMPARISON:  03/12/2013  FINDINGS: Segmentation:  Standard.  Alignment:  Physiologic.  Vertebrae: No acute fracture, evidence of discitis, or aggressive bone lesion.  Conus medullaris and cauda equina: Conus extends to the L1 level. Conus and cauda equina appear normal.  Paraspinal and other soft tissues: No acute paraspinal abnormality.  Disc levels:  Disc spaces: Disc desiccation throughout the lumbar spine. Disc heights are maintained.  T12-L1: No significant disc bulge. No neural foraminal stenosis. No central  canal stenosis.  L1-L2: No significant disc bulge. Mild bilateral facet arthropathy. No foraminal or central canal stenosis.  L2-L3: Mild broad-based disc bulge. Mild bilateral facet arthropathy. No foraminal or central canal stenosis.  L3-L4: Mild broad-based disc bulge. Mild bilateral facet arthropathy. No foraminal or central canal stenosis.  L4-L5: Mild broad-based disc bulge. Mild bilateral facet arthropathy. No foraminal or central canal stenosis.  L5-S1: Broad-based disc bulge. No foraminal or central canal stenosis.  IMPRESSION: 1. Mild lumbar spine spondylosis as described above. 2. No acute osseous injury of the lumbar spine.  Electronically Signed   By: Elige Ko M.D.   On: 11/23/2022 09:28 Note: Reviewed        Physical Exam  General appearance: Well nourished, well developed, and well hydrated. In no apparent acute distress Mental status: Alert, oriented x 3 (person, place, & time)       Respiratory: No evidence of acute respiratory distress Eyes: PERLA Vitals: There were no vitals taken for this visit. BMI: Estimated body mass index is 31.16 kg/m as calculated from the following:   Height as of 11/25/22: 5\' 9"  (1.753 m).   Weight as of 11/25/22: 211 lb (95.7 kg). Ideal: Patient weight not recorded  Assessment   Diagnosis Status  No diagnosis found. Controlled Controlled Controlled   Updated Problems: No problems updated.  Plan of Care  Problem-specific:  No problem-specific Assessment & Plan notes found for this encounter.  Kenneth Osborn has a current medication list which includes the following long-term medication(s): cetirizine, chlorthalidone, cyclobenzaprine, esomeprazole, ezetimibe, gabapentin, humulin r u-500 kwikpen, metoprolol succinate, montelukast, niacin, and oxycodone.  Pharmacotherapy (Medications Ordered): No orders of the defined types were placed in this encounter.  Orders:  No orders of the defined types were placed in  this encounter.  Follow-up plan:   No follow-ups on file.      Interventional Therapies  Risk Factors  Considerations:   EFFIENT Anticoagulation: (Stop: 7-10 days  Re-start: 6 hrs)  Mnire's disease  OSA  GERD  CAD  (B) Carotid Artery Stenosis  s/p CABG  Hx. Cardiac Syncope  T2IDDM  Hx. MI (06/2010)  Unstable Angina  Lumbar SCS: implanted on 01/18/2019 and removed on 08/09/2019.   Planned  Pending:   Today I have ordered a repeat MRI of the lumbar spine since his last MRI was in 2015 and his condition continues to worsen and he is response to interventional therapies has been somewhat limited. Diagnostic/therapeutic left suprapatellar intra-articular steroid patellofemoral knee injection #1   Under consideration:   Diagnostic/therapeutic left suprapatellar  intra-articular steroid patellofemoral knee injection #1 CYP2D6 / CYP3A4 genetic testing.   Completed:   Therapeutic right Racz procedure x1 (12/18/2021) (100/100/70/70-100)  Diagnostic/therapeutic right caudal ESI x1 (11/13/2021)  Palliative right PSIS MNB/TPI x3 (11/13/2017) (100/100/100)  Diagnostic/palliative right lumbar facet MBB x2 (09/22/2018) (100/100/75/75)  Diagnostic/palliative left lumbar facet MBB x2 (09/22/2018) (100/100/75/75)  Palliative left lumbar facet RFA x3 (02/20/2021) (85/85/55/60-70)  Palliative right lumbar facet RFA x3 (01/09/2021) (100/95/50/60-70)    Therapeutic  Palliative (PRN) options:   Palliative right PSIS MNB/TPI  Diagnostic/palliative lumbar facet MBB  Palliative lumbar facet RFA    Pharmacotherapy  Nonopioids transferred 11/22/2019: Magnesium, Flexeril, and Neurontin. Last "Drug Holiday": See 05/06/2022 note.  (To be completed on 05/27/2022)         Recent Visits Date Type Provider Dept  11/25/22 Office Visit Delano Metz, MD [facility]  10/15/22 Office Visit Delano Metz, MD [facility]  09/24/22 Procedure visit Delano Metz, MD [facility]  Showing recent visits within past 90 days and meeting all other requirements Future Appointments Date Type Provider Dept  12/18/22 Appointment Delano Metz, MD [facility]  Showing future appointments within next 90 days and meeting all other requirements  I discussed the assessment and treatment plan with the patient. The patient was provided an opportunity to ask questions and all were answered. The patient agreed with the plan and demonstrated an understanding of the instructions.  Patient advised to call back or seek an in-person evaluation if the symptoms or condition worsens.  Duration of encounter: *** minutes.  Total time on encounter, as per AMA guidelines included both the face-to-face and non-face-to-face time personally spent by the physician and/or other qualified health care professional(s) on the day of the encounter (includes time in activities that require the physician or other qualified health care professional and does not include time in activities normally performed by clinical staff). Physician's time may include the following activities when performed: Preparing to see the patient (e.g., pre-charting review of records, searching for previously ordered imaging, lab work, and nerve conduction tests) Review of prior analgesic pharmacotherapies. Reviewing PMP Interpreting ordered tests (e.g., lab work, imaging, nerve conduction tests) Performing post-procedure evaluations, including interpretation of diagnostic procedures Obtaining and/or reviewing separately obtained history Performing a medically appropriate examination and/or evaluation Counseling and educating the patient/family/caregiver Ordering medications, tests, or procedures Referring and communicating with other health care professionals (when not separately reported) Documenting clinical information in the electronic or other health record Independently interpreting results (not  separately reported) and communicating results to the patient/ family/caregiver Care coordination (not separately reported)  Note by: Oswaldo Done, MD Date: 12/18/2022; Time: 12:55 PM

## 2022-12-18 ENCOUNTER — Other Ambulatory Visit: Payer: Self-pay | Admitting: Pain Medicine

## 2022-12-18 ENCOUNTER — Ambulatory Visit: Payer: Medicare Other | Attending: Pain Medicine | Admitting: Pain Medicine

## 2022-12-18 ENCOUNTER — Encounter: Payer: Self-pay | Admitting: Pain Medicine

## 2022-12-18 VITALS — BP 134/82 | HR 118 | Temp 97.5°F | Resp 16 | Ht 69.0 in | Wt 211.0 lb

## 2022-12-18 DIAGNOSIS — Z79891 Long term (current) use of opiate analgesic: Secondary | ICD-10-CM | POA: Diagnosis present

## 2022-12-18 DIAGNOSIS — M549 Dorsalgia, unspecified: Secondary | ICD-10-CM | POA: Diagnosis present

## 2022-12-18 DIAGNOSIS — M961 Postlaminectomy syndrome, not elsewhere classified: Secondary | ICD-10-CM | POA: Diagnosis present

## 2022-12-18 DIAGNOSIS — M25562 Pain in left knee: Secondary | ICD-10-CM

## 2022-12-18 DIAGNOSIS — G894 Chronic pain syndrome: Secondary | ICD-10-CM | POA: Diagnosis present

## 2022-12-18 DIAGNOSIS — Z79899 Other long term (current) drug therapy: Secondary | ICD-10-CM

## 2022-12-18 DIAGNOSIS — M79602 Pain in left arm: Secondary | ICD-10-CM

## 2022-12-18 DIAGNOSIS — M79605 Pain in left leg: Secondary | ICD-10-CM

## 2022-12-18 DIAGNOSIS — G8929 Other chronic pain: Secondary | ICD-10-CM | POA: Diagnosis present

## 2022-12-18 DIAGNOSIS — M7918 Myalgia, other site: Secondary | ICD-10-CM | POA: Diagnosis present

## 2022-12-18 DIAGNOSIS — M47894 Other spondylosis, thoracic region: Secondary | ICD-10-CM

## 2022-12-18 DIAGNOSIS — M47812 Spondylosis without myelopathy or radiculopathy, cervical region: Secondary | ICD-10-CM

## 2022-12-18 DIAGNOSIS — M545 Low back pain, unspecified: Secondary | ICD-10-CM

## 2022-12-18 MED ORDER — OXYCODONE HCL 5 MG PO TABS: 5.0000 mg | ORAL_TABLET | Freq: Three times a day (TID) | ORAL | 0 refills | Status: DC

## 2022-12-18 NOTE — Progress Notes (Signed)
Nursing Pain Medication Assessment:  Safety precautions to be maintained throughout the outpatient stay will include: orient to surroundings, keep bed in low position, maintain call bell within reach at all times, provide assistance with transfer out of bed and ambulation.  Medication Inspection Compliance: Pill count conducted under aseptic conditions, in front of the patient. Neither the pills nor the bottle was removed from the patient's sight at any time. Once count was completed pills were immediately returned to the patient in their original bottle.  Medication: Oxycodone IR Pill/Patch Count:  11 of 90 pills remain Pill/Patch Appearance: Markings consistent with prescribed medication Bottle Appearance: Standard pharmacy container. Clearly labeled. Filled Date: 10 / 02 / 2024 Last Medication intake:  Today

## 2022-12-18 NOTE — Patient Instructions (Addendum)
Pain Management Discharge Instructions  General Discharge Instructions :  If you need to reach your doctor call: Monday-Friday 8:00 am - 4:00 pm at [phone] or toll free [phone].  After clinic hours [phone] to have operator reach doctor.  Bring all of your medication bottles to all your appointments in the pain clinic.  To cancel or reschedule your appointment with Pain Management please remember to call 24 hours in advance to avoid a fee.  Refer to the educational materials which you have been given on: General Risks, I had my Procedure. Discharge Instructions, Post Sedation.  Post Procedure Instructions:  The drugs you were given will stay in your system until tomorrow, so for the next 24 hours you should not drive, make any legal decisions or drink any alcoholic beverages.  You may eat anything you prefer, but it is better to start with liquids then soups and crackers, and gradually work up to solid foods.  Please notify your doctor immediately if you have any unusual bleeding, trouble breathing or pain that is not related to your normal pain.  Depending on the type of procedure that was done, some parts of your body may feel week and/or numb.  This usually clears up by tonight or the next day.  Walk with the use of an assistive device or accompanied by an adult for the 24 hours.  You may use ice on the affected area for the first 24 hours.  Put ice in a Ziploc bag and cover with a towel and place against area 15 minutes on 15 minutes off.  You may switch to heat after 24 hours.Knee Injection A knee injection is a procedure to get medicine into your knee joint to relieve the pain, swelling, and stiffness of arthritis. Your health care provider uses a needle to inject medicine, which may also help to lubricate and cushion your knee joint. You may need more than one injection. Tell a health care provider about: Any allergies you have. All medicines you are taking,  including vitamins, herbs, eye drops, creams, and over-the-counter medicines. Any problems you or family members have had with anesthetic medicines. Any blood disorders you have. Any surgeries you have had. Any medical conditions you have. Whether you are pregnant or may be pregnant. What are the risks? Generally, this is a safe procedure. However, problems may occur, including: Infection. Bleeding. Symptoms that get worse. Damage to the area around your knee. Allergic reaction to any of the medicines. Skin reactions from repeated injections. What happens before the procedure? Ask your health care provider about: Changing or stopping your regular medicines. This is especially important if you are taking diabetes medicines or blood thinners. Taking medicines such as aspirin and ibuprofen. These medicines can thin your blood. Do not take these medicines unless your health care provider tells you to take them. Taking over-the-counter medicines, vitamins, herbs, and supplements. Plan to have a responsible adult take you home from the hospital or clinic. What happens during the procedure?  You will sit or lie down in a position for your knee to be treated. The skin over your kneecap will be cleaned with a germ-killing soap. You will be given a medicine that numbs the area (local anesthetic). You may feel some stinging. The medicine will be injected into your knee. The needle is carefully placed between your kneecap and your knee. The medicine is injected into the joint space. The needle will be removed at the end of the procedure. A bandage (dressing) may  be placed over the injection site. The procedure may vary among health care providers and hospitals. What can I expect after the procedure? Your blood pressure, heart rate, breathing rate, and blood oxygen level will be monitored until you leave the hospital or clinic. You may have to move your knee through its full range of motion. This  helps to get all the medicine into your joint space. You will be watched to make sure that you do not have a reaction to the injected medicine. You may feel more pain, swelling, and warmth than you did before the injection. This reaction may last about 1-2 days. Follow these instructions at home: Medicines Take over-the-counter and prescription medicines only as told by your health care provider. Ask your health care provider if the medicine prescribed to you requires you to avoid driving or using machinery. Do not take medicines such as aspirin and ibuprofen unless your health care provider tells you to take them. Injection site care Follow instructions from your health care provider about: How to take care of your puncture site. When and how you should change your dressing. When you should remove your dressing. Check your injection area every day for signs of infection. Check for: More redness, swelling, or pain after 2 days. Fluid or blood. Pus or a bad smell. Warmth. Managing pain, stiffness, and swelling  If directed, put ice on the injection area. To do this: Put ice in a plastic bag. Place a towel between your skin and the bag. Leave the ice on for 20 minutes, 2-3 times per day. Remove the ice if your skin turns bright red. This is very important. If you cannot feel pain, heat, or cold, you have a greater risk of damage to the area. Do not apply heat to your knee. Raise (elevate) the injection area above the level of your heart while you are sitting or lying down. General instructions If you were given a dressing, keep it dry until your health care provider says it can be removed. Ask your health care provider when you can start showering or bathing. Avoid strenuous activities for as long as directed by your health care provider. Ask your health care provider when you can return to your normal activities. Keep all follow-up visits. This is important. You may need more  injections. Contact a health care provider if you have: A fever. Warmth in your injection area. Fluid, blood, or pus coming from your injection site. Symptoms at your injection site that last longer than 2 days after your procedure. Get help right away if: Your knee turns very red. Your knee becomes very swollen. Your knee is in severe pain. Summary A knee injection is a procedure to get medicine into your knee joint to relieve the pain, swelling, and stiffness of arthritis. A needle is carefully placed between your kneecap and your knee to inject medicine into the joint space. Before the procedure, ask your health care provider about changing or stopping your regular medicines, especially if you are taking diabetes medicines or blood thinners. Contact your health care provider if you have any problems or questions after your procedure. This information is not intended to replace advice given to you by your health care provider. Make sure you discuss any questions you have with your health care provider. Document Revised: 07/21/2019 Document Reviewed: 07/21/2019 Elsevier Patient Education  2024 Elsevier Inc.  ______________________________________________________________________    Opioid Pain Medication Update  To: All patients taking opioid pain medications. (I.e.: hydrocodone, hydromorphone,  oxycodone, oxymorphone, morphine, codeine, methadone, tapentadol, tramadol, buprenorphine, fentanyl, etc.)  Re: Updated review of side effects and adverse reactions of opioid analgesics, as well as new information about long term effects of this class of medications.  Direct risks of long-term opioid therapy are not limited to opioid addiction and overdose. Potential medical risks include serious fractures, breathing problems during sleep, hyperalgesia, immunosuppression, chronic constipation, bowel obstruction, myocardial infarction, and tooth decay secondary to xerostomia.  Unpredictable  adverse effects that can occur even if you take your medication correctly: Cognitive impairment, respiratory depression, and death. Most people think that if they take their medication "correctly", and "as instructed", that they will be safe. Nothing could be farther from the truth. In reality, a significant amount of recorded deaths associated with the use of opioids has occurred in individuals that had taken the medication for a long time, and were taking their medication correctly. The following are examples of how this can happen: Patient taking his/her medication for a long time, as instructed, without any side effects, is given a certain antibiotic or another unrelated medication, which in turn triggers a "Drug-to-drug interaction" leading to disorientation, cognitive impairment, impaired reflexes, respiratory depression or an untoward event leading to serious bodily harm or injury, including death.  Patient taking his/her medication for a long time, as instructed, without any side effects, develops an acute impairment of liver and/or kidney function. This will lead to a rapid inability of the body to breakdown and eliminate their pain medication, which will result in effects similar to an "overdose", but with the same medicine and dose that they had always taken. This again may lead to disorientation, cognitive impairment, impaired reflexes, respiratory depression or an untoward event leading to serious bodily harm or injury, including death.  A similar problem will occur with patients as they grow older and their liver and kidney function begins to decrease as part of the aging process.  Background information: Historically, the original case for using long-term opioid therapy to treat chronic noncancer pain was based on safety assumptions that subsequent experience has called into question. In 1996, the American Pain Society and the American Academy of Pain Medicine issued a consensus statement  supporting long-term opioid therapy. This statement acknowledged the dangers of opioid prescribing but concluded that the risk for addiction was low; respiratory depression induced by opioids was short-lived, occurred mainly in opioid-naive patients, and was antagonized by pain; tolerance was not a common problem; and efforts to control diversion should not constrain opioid prescribing. This has now proven to be wrong. Experience regarding the risks for opioid addiction, misuse, and overdose in community practice has failed to support these assumptions.  According to the Centers for Disease Control and Prevention, fatal overdoses involving opioid analgesics have increased sharply over the past decade. Currently, more than 96,700 people die from drug overdoses every year. Opioids are a factor in 7 out of every 10 overdose deaths. Deaths from drug overdose have surpassed motor vehicle accidents as the leading cause of death for individuals between the ages of 55 and 21.  Clinical data suggest that neuroendocrine dysfunction may be very common in both men and women, potentially causing hypogonadism, erectile dysfunction, infertility, decreased libido, osteoporosis, and depression. Recent studies linked higher opioid dose to increased opioid-related mortality. Controlled observational studies reported that long-term opioid therapy may be associated with increased risk for cardiovascular events. Subsequent meta-analysis concluded that the safety of long-term opioid therapy in elderly patients has not been proven.   Side  Effects and adverse reactions: Common side effects: Drowsiness (sedation). Dizziness. Nausea and vomiting. Constipation. Physical dependence -- Dependence often manifests with withdrawal symptoms when opioids are discontinued or decreased. Tolerance -- As you take repeated doses of opioids, you require increased medication to experience the same effect of pain relief. Respiratory depression  -- This can occur in healthy people, especially with higher doses. However, people with COPD, asthma or other lung conditions may be even more susceptible to fatal respiratory impairment.  Uncommon side effects: An increased sensitivity to feeling pain and extreme response to pain (hyperalgesia). Chronic use of opioids can lead to this. Delayed gastric emptying (the process by which the contents of your stomach are moved into your small intestine). Muscle rigidity. Immune system and hormonal dysfunction. Quick, involuntary muscle jerks (myoclonus). Arrhythmia. Itchy skin (pruritus). Dry mouth (xerostomia).  Long-term side effects: Chronic constipation. Sleep-disordered breathing (SDB). Increased risk of bone fractures. Hypothalamic-pituitary-adrenal dysregulation. Increased risk of overdose.  RISKS: Respiratory depression and death: Opioids increase the risk of respiratory depression and death.  Drug-to-drug interactions: Opioids are relatively contraindicated in combination with benzodiazepines, sleep inducers, and other central nervous system depressants. Other classes of medications (i.e.: certain antibiotics and even over-the-counter medications) may also trigger or induce respiratory depression in some patients.  Medical conditions: Patients with pre-existing respiratory problems are at higher risk of respiratory failure and/or depression when in combination with opioid analgesics. Opioids are relatively contraindicated in some medical conditions such as central sleep apnea.   Fractures and Falls:  Opioids increase the risk and incidence of falls. This is of particular importance in elderly patients.  Endocrine System:  Long-term administration is associated with endocrine abnormalities (endocrinopathies). (Also known as Opioid-induced Endocrinopathy) Influences on both the hypothalamic-pituitary-adrenal axis?and the hypothalamic-pituitary-gonadal axis have been demonstrated with  consequent hypogonadism and adrenal insufficiency in both sexes. Hypogonadism and decreased levels of dehydroepiandrosterone sulfate have been reported in men and women. Endocrine effects include: Amenorrhoea in women (abnormal absence of menstruation) Reduced libido in both sexes Decreased sexual function Erectile dysfunction in men Hypogonadisms (decreased testicular function with shrinkage of testicles) Infertility Depression and fatigue Loss of muscle mass Anxiety Depression Immune suppression Hyperalgesia Weight gain Anemia Osteoporosis Patients (particularly women of childbearing age) should avoid opioids. There is insufficient evidence to recommend routine monitoring of asymptomatic patients taking opioids in the long-term for hormonal deficiencies.  Immune System: Human studies have demonstrated that opioids have an immunomodulating effect. These effects are mediated via opioid receptors both on immune effector cells and in the central nervous system. Opioids have been demonstrated to have adverse effects on antimicrobial response and anti-tumour surveillance. Buprenorphine has been demonstrated to have no impact on immune function.  Opioid Induced Hyperalgesia: Human studies have demonstrated that prolonged use of opioids can lead to a state of abnormal pain sensitivity, sometimes called opioid induced hyperalgesia (OIH). Opioid induced hyperalgesia is not usually seen in the absence of tolerance to opioid analgesia. Clinically, hyperalgesia may be diagnosed if the patient on long-term opioid therapy presents with increased pain. This might be qualitatively and anatomically distinct from pain related to disease progression or to breakthrough pain resulting from development of opioid tolerance. Pain associated with hyperalgesia tends to be more diffuse than the pre-existing pain and less defined in quality. Management of opioid induced hyperalgesia requires opioid dose  reduction.  Cancer: Chronic opioid therapy has been associated with an increased risk of cancer among noncancer patients with chronic pain. This association was more evident in chronic strong opioid users.  Chronic opioid consumption causes significant pathological changes in the small intestine and colon. Epidemiological studies have found that there is a link between opium dependence and initiation of gastrointestinal cancers. Cancer is the second leading cause of death after cardiovascular disease. Chronic use of opioids can cause multiple conditions such as GERD, immunosuppression and renal damage as well as carcinogenic effects, which are associated with the incidence of cancers.   Mortality: Long-term opioid use has been associated with increased mortality among patients with chronic non-cancer pain (CNCP).  Prescription of long-acting opioids for chronic noncancer pain was associated with a significantly increased risk of all-cause mortality, including deaths from causes other than overdose.  Reference: Von Korff M, Kolodny A, Deyo RA, Chou R. Long-term opioid therapy reconsidered. Ann Intern Med. 2011 Sep 6;155(5):325-8. doi: 10.7326/[id number]. PMID: 95621308; PMCID: [id number]. Randon Goldsmith, Hayward RA, Dunn KM, [place] KP. Risk of adverse events in patients prescribed long-term opioids: A cohort study in the [place] [facility] Research D[employer]. Eur J Pain. 2019 May;23(5):908-922. doi: 10.1002/ejp.1357. Epub 2019 Jan 31. PMID: 52841324. Colameco S, Coren JS, Ciervo CA. Continuous opioid treatment for chronic noncancer pain: a time for moderation in prescribing. Postgrad Med. 2009 Jul;121(4):61-6. doi: 10.3810/pgm.2009.07.2032. PMID: 40102725. William Hamburger RN, [place], Blazina I, Cristopher [place], Bougatsos C, Deyo RA. The effectiveness and risks of long-term opioid therapy for chronic pain: a systematic review for a [employer] of  Health Pathways to [employer]. Ann Intern Med. 2015 Feb 17;162(4):276-86. doi: 10.7326/M14-2559. PMID: 36644034. Caryl Bis [facility], [place] DM. NCHS Data Brief No. 22. [place]: Centers for Disease Control and Prevention; 2009. Sep, Increase in Fatal Poisonings Involving Opioid Analgesics in the [place], 1999-2006. Song IA, Choi HR, Oh TK. Long-term opioid use and mortality in patients with chronic non-cancer pain: Ten-year follow-up study in [place] from 2010 through 2019. EClinicalMedicine. 2022 Jul 18;51:101558. doi: 10.1016/j.eclinm.2022.742595. PMID: 63875643; PMCID: [id number]. Huser, W., Schubert, T., Vogelmann, T. et al. All-cause mortality in patients with long-term opioid therapy compared with non-opioid analgesics for chronic non-cancer pain: a database study. BMC Med 18, 162 (2020). http://lester.info/ Rashidian H, Karie Kirks, Malekzadeh R, Haghdoost AA. An Ecological Study of the Association between Opiate Use and Incidence of Cancers. Addict Health. 2016 Fall;8(4):252-260. PMID: 60630160; PMCID: [id number].  Our Goal: Our goal is to control your pain with means other than the use of opioid pain medications.  Our Recommendation: Talk to your physician about coming off of these medications. We can assist you with the tapering down and stopping these medicines. Based on the new information, even if you cannot completely stop the medication, a decrease in the dose may be associated with a lesser risk. Ask for other means of controlling the pain. Decrease or eliminate those factors that significantly contribute to your pain such as smoking, obesity, and a diet heavily tilted towards "inflammatory" nutrients.  Last Updated: 08/26/2022   ______________________________________________________________________       ______________________________________________________________________    National Pain Medication Shortage  The [place] is  experiencing worsening drug shortages. These have had a negative widespread effect on patient care and treatment. Not expected to improve any time soon. Predicted to last past 2029.   Drug shortage list (generic names) Oxycodone IR Oxycodone/APAP Oxymorphone IR Hydromorphone Hydrocodone/APAP Morphine  Where is the problem?  Manufacturing and supply level.  Will this shortage affect you?  Only if you take any of the above pain medications.  How? You may be  unable to fill your prescription.  Your pharmacist may offer a "partial fill" of your prescription. (Warning: Do not accept partial fills.) Prescriptions partially filled cannot be transferred to another pharmacy. Read our Medication Rules and Regulation. Depending on how much medicine you are dependent on, you may experience withdrawals when unable to get the medication.  Recommendations: Consider ending your dependence on opioid pain medications. Ask your pain specialist to assist you with the process. Consider switching to a medication currently not in shortage, such as Buprenorphine. Talk to your pain specialist about this option. Consider decreasing your pain medication requirements by managing tolerance thru "Drug Holidays". This may help minimize withdrawals, should you run out of medicine. Control your pain thru the use of non-pharmacological interventional therapies.   Your prescriber: Prescribers cannot be blamed for shortages. Medication manufacturing and supply issues cannot be fixed by the prescriber.   NOTE: The prescriber is not responsible for supplying the medication, or solving supply issues. Work with your pharmacist to solve it. The patient is responsible for the decision to take or continue taking the medication and for identifying and securing a legal supply source. By law, supplying the medication is the job and responsibility of the pharmacy. The prescriber is responsible for the evaluation, monitoring, and  prescribing of these medications.   Prescribers will NOT: Re-issue prescriptions that have been partially filled. Re-issue prescriptions already sent to a pharmacy.  Re-send prescriptions to a different pharmacy because yours did not have your medication. Ask pharmacist to order more medicine or transfer the prescription to another pharmacy. (Read below.)  New 2023 regulation: "October 19, 2021 Revised Regulation Allows DEA-Registered Pharmacies to Transfer Electronic Prescriptions at a Patient's Request DEA Headquarters Division - Public Information Office Patients now have the ability to request their electronic prescription be transferred to another pharmacy without having to go back to their practitioner to initiate the request. This revised regulation went into effect on Monday, October 15, 2021.     At a patient's request, a DEA-registered retail pharmacy can now transfer an electronic prescription for a controlled substance (schedules II-V) to another DEA-registered retail pharmacy. Prior to this change, patients would have to go through their practitioner to cancel their prescription and have it re-issued to a different pharmacy. The process was taxing and time consuming for both patients and practitioners.    The Drug Enforcement Administration [facility]) published its intent to revise the process for transferring electronic prescriptions on January 07, 2020.  The final rule was published in the federal register on September 13, 2021 and went into effect 30 days later.  Under the final rule, a prescription can only be transferred once between pharmacies, and only if allowed under existing state or other applicable law. The prescription must remain in its electronic form; may not be altered in any way; and the transfer must be communicated directly between two licensed pharmacists. It's important to note, any authorized refills transfer with the original prescription, which means the entire  prescription will be filled at the same pharmacy".  Reference: HugeHand.is [facility] website announcement)  CheapWipes.at.pdf Financial planner of Justice)   [employer] / Vol. 88, No. 143 / Thursday, September 13, 2021 / Rules and Regulations DEPARTMENT OF JUSTICE  Drug Enforcement Administration  21 CFR Part 1306  [Docket No. [id number]]  RIN [id number] Transfer of Electronic Prescriptions for Schedules II-V Controlled Substances Between Pharmacies for Initial Filling  ______________________________________________________________________       ______________________________________________________________________    Transfer of  Pain Medication between Pharmacies  Re: 2023 DEA Clarification on existing regulation  Published on DEA Website: October 19, 2021  Title: Revised Regulation Allows DEA-Registered Pharmacies to Electrical engineer Prescriptions at a Patient's Request DEA [employer] Division - [employer]  "Patients now have the ability to request their electronic prescription be transferred to another pharmacy without having to go back to their practitioner to initiate the request. This revised regulation went into effect on Monday, October 15, 2021.     At a patient's request, a DEA-registered retail pharmacy can now transfer an electronic prescription for a controlled substance (schedules II-V) to another DEA-registered retail pharmacy. Prior to this change, patients would have to go through their practitioner to cancel their prescription and have it re-issued to a different pharmacy. The process was taxing and time consuming for both patients and practitioners.    The Drug Enforcement Administration [facility]) published its intent to revise the process for transferring electronic prescriptions on January 07, 2020.  The final rule was published in the federal register on September 13, 2021 and went into effect 30 days later.  Under the final rule, a prescription can only be transferred once between pharmacies, and only if allowed under existing state or other applicable law. The prescription must remain in its electronic form; may not be altered in any way; and the transfer must be communicated directly between two licensed pharmacists. It's important to note, any authorized refills transfer with the original prescription, which means the entire prescription will be filled at the same pharmacy."    REFERENCES: 1. DEA website announcement HugeHand.is  2. Department of Justice website  CheapWipes.at.pdf  3. DEPARTMENT OF JUSTICE Drug Enforcement Administration 21 CFR Part 1306 [Docket No. [id number]-637] RIN 1117-AB64 "Transfer of Electronic Prescriptions for Schedules II-V Controlled Substances Between Pharmacies for Initial Filling"  ______________________________________________________________________       ______________________________________________________________________    Medication Rules  Purpose: To inform patients, and their family members, of our medication rules and regulations.  Applies to: All patients receiving prescriptions from our practice (written or electronic).  Pharmacy of record: This is the pharmacy where your electronic prescriptions will be sent. Make sure we have the correct one.  Electronic prescriptions: In compliance with the [facility] Strengthen Opioid Misuse Prevention (STOP) Act of 2017 (Session Conni Elliot [phone]), effective February 18, 2018, all controlled substances must be electronically prescribed. Written prescriptions, faxing, or calling prescriptions to a pharmacy will no longer be done.  Prescription  refills: These will be provided only during in-person appointments. No medications will be renewed without a "face-to-face" evaluation with your provider. Applies to all prescriptions.  NOTE: The following applies primarily to controlled substances (Opioid* Pain Medications).   Type of encounter (visit): For patients receiving controlled substances, face-to-face visits are required. (Not an option and not up to the patient.)  Patient's Responsibilities: Pain Pills: Bring all pain pills to every appointment (except for procedure appointments). Pill counts are required.  Pill Bottles: Bring pills in original pharmacy bottle. Bring bottle, even if empty. Always bring the bottle of the most recent fill.  Medication refills: You are responsible for knowing and keeping track of what medications you are taking and when is it that you will need a refill. The day before your appointment: write a list of all prescriptions that need to be refilled. The day of the appointment: give the list to the admitting nurse. Prescriptions will be written only during appointments. No prescriptions will be written on procedure days.  If you forget a medication: it will not be "Called in", "Faxed", or "electronically sent". You will need to get another appointment to get these prescribed. No early refills. Do not call asking to have your prescription filled early. Partial  or short prescriptions: Occasionally your pharmacy may not have enough pills to fill your prescription.  NEVER ACCEPT a partial fill or a prescription that is short of the total amount of pills that you were prescribed.  With controlled substances the law allows 72 hours for the pharmacy to complete the prescription.  If the prescription is not completed within 72 hours, the pharmacist will require a new prescription to be written. This means that you will be short on your medicine and we WILL NOT send another prescription to complete your original  prescription.  Instead, request the pharmacy to send a carrier to a nearby branch to get enough medication to provide you with your full prescription. Prescription Accuracy: You are responsible for carefully inspecting your prescriptions before leaving our office. Have the discharge nurse carefully go over each prescription with you, before taking them home. Make sure that your name is accurately spelled, that your address is correct. Check the name and dose of your medication to make sure it is accurate. Check the number of pills, and the written instructions to make sure they are clear and accurate. Make sure that you are given enough medication to last until your next medication refill appointment. Taking Medication: Take medication as prescribed. When it comes to controlled substances, taking less pills or less frequently than prescribed is permitted and encouraged. Never take more pills than instructed. Never take the medication more frequently than prescribed.  Inform other Doctors: Always inform, all of your healthcare providers, of all the medications you take. Pain Medication from other Providers: You are not allowed to accept any additional pain medication from any other Doctor or Healthcare provider. There are two exceptions to this rule. (see below) In the event that you require additional pain medication, you are responsible for notifying us, as stated below. Cough Medicine: Often these contain an opioid, such as codeine or hydrocodone. Never accept or take cough medicine containing these opioids if you are already taking an opioid* medication. The combination may cause respiratory failure and death. Medication Agreement: You are responsible for carefully reading and following our Medication Agreement. This must be signed before receiving any prescriptions from our practice. Safely store a copy of your signed Agreement. Violations to the Agreement will result in no further prescriptions.  (Additional copies of our Medication Agreement are available upon request.) Laws, Rules, & Regulations: All patients are expected to follow all [address] and [employer], Rules, [place] [employer]. Ignorance of the Laws does not constitute a valid excuse.  Illegal drugs and Controlled Substances: The use of illegal substances (including, but not limited to marijuana and its derivatives) and/or the illegal use of any controlled substances is strictly prohibited. Violation of this rule may result in the immediate and permanent discontinuation of any and all prescriptions being written by our practice. The use of any illegal substances is prohibited. Adopted CDC guidelines & recommendations: Target dosing levels will be at or below 60 MME/day. Use of benzodiazepines** is not recommended. Urine Drug testing: Patients taking controlled substances will be required to provide a urine sample upon request. Do not void before coming to your medication management appointments. Hold emptying your bladder until you are admitted. The admitting nurse will inform you if a sample is  required. Our practice reserves the right to call you at any time to provide a sample. Once receiving the call, you have 24 hours to comply with request. Not providing a sample upon request may result in termination of medication therapy.  Exceptions: There are only two exceptions to the rule of not receiving pain medications from other Healthcare Providers. Exception #1 (Emergencies): In the event of an emergency (i.e.: accident requiring emergency care), you are allowed to receive additional pain medication. However, you are responsible for: As soon as you are able, call our office [phone], at any time of the day or night, and leave a message stating your name, the date and nature of the emergency, and the name and dose of the medication prescribed. In the event that your call is answered by a member of our staff, make sure to document  and save the date, time, and the name of the person that took your information.  Exception #2 (Planned Surgery): In the event that you are scheduled by another doctor or dentist to have any type of surgery or procedure, you are allowed (for a period no longer than 30 days), to receive additional pain medication, for the acute post-op pain. However, in this case, you are responsible for picking up a copy of our "Post-op Pain Management for Surgeons" handout, and giving it to your surgeon or dentist. This document is available at our office, and does not require an appointment to obtain it. Simply go to our office during business hours (Monday-Thursday from 8:00 AM to 4:00 PM) (Friday 8:00 AM to 12:00 Noon) or if you have a scheduled appointment with [name], prior to your surgery, and ask for it by name. In addition, you are responsible for: calling our office [phone], at any time of the day or night, and leaving a message stating your name, name of your surgeon, type of surgery, and date of procedure or surgery. Failure to comply with your responsibilities may result in termination of therapy involving the controlled substances.  Consequences:  Non-compliance with the above rules may result in permanent discontinuation of medication prescription therapy. All patients receiving any type of controlled substance is expected to comply with the above patient responsibilities. Not doing so may result in permanent discontinuation of medication prescription therapy. Medication Agreement Violation. Following the above rules, including your responsibilities will help you in avoiding a Medication Agreement Violation ("Breaking your Pain Medication Contract").  *Opioid medications include: morphine, codeine, oxycodone, oxymorphone, hydrocodone, hydromorphone, meperidine, tramadol, tapentadol, buprenorphine, fentanyl, methadone. **Benzodiazepine medications include: diazepam (Valium), alprazolam (Xanax), clonazepam  (Klonopine), lorazepam (Ativan), clorazepate (Tranxene), chlordiazepoxide (Librium), estazolam (Prosom), oxazepam (Serax), temazepam (Restoril), triazolam (Halcion) (Last updated: 12/11/2022) ______________________________________________________________________      ______________________________________________________________________    Medication Recommendations and Reminders  Applies to: All patients receiving prescriptions (written and/or electronic).  Medication Rules & Regulations: You are responsible for reading, knowing, and following our "Medication Rules" document. These exist for your safety and that of others. They are not flexible and neither are we. Dismissing or ignoring them is an act of "non-compliance" that may result in complete and irreversible termination of such medication therapy. For safety reasons, "non-compliance" will not be tolerated. As with the [place] fundamental legal principle of "ignorance of the law is no defense", we will accept no excuses for not having read and knowing the content of documents provided to you by our practice.  Pharmacy of record:  Definition: This is the pharmacy where your electronic prescriptions will be  sent.  We do not endorse any particular pharmacy. It is up to you and your insurance to decide what pharmacy to use.  We do not restrict you in your choice of pharmacy. However, once we write for your prescriptions, we will NOT be re-sending more prescriptions to fix restricted supply problems created by your pharmacy, or your insurance.  The pharmacy listed in the electronic medical record should be the one where you want electronic prescriptions to be sent. If you choose to change pharmacy, simply notify our nursing staff. Changes will be made only during your regular appointments and not over the phone.  Recommendations: Keep all of your pain medications in a safe place, under lock and key, even if you live alone. We will NOT replace  lost, stolen, or damaged medication. We do not accept "Police Reports" as proof of medications having been stolen. After you fill your prescription, take 1 week's worth of pills and put them away in a safe place. You should keep a separate, properly labeled bottle for this purpose. The remainder should be kept in the original bottle. Use this as your primary supply, until it runs out. Once it's gone, then you know that you have 1 week's worth of medicine, and it is time to come in for a prescription refill. If you do this correctly, it is unlikely that you will ever run out of medicine. To make sure that the above recommendation works, it is very important that you make sure your medication refill appointments are scheduled at least 1 week before you run out of medicine. To do this in an effective manner, make sure that you do not leave the office without scheduling your next medication management appointment. Always ask the nursing staff to show you in your prescription , when your medication will be running out. Then arrange for the receptionist to get you a return appointment, at least 7 days before you run out of medicine. Do not wait until you have 1 or 2 pills left, to come in. This is very poor planning and does not take into consideration that we may need to cancel appointments due to bad weather, sickness, or emergencies affecting our staff. DO NOT ACCEPT A "Partial Fill": If for any reason your pharmacy does not have enough pills/tablets to completely fill or refill your prescription, do not allow for a "partial fill". The law allows the pharmacy to complete that prescription within 72 hours, without requiring a new prescription. If they do not fill the rest of your prescription within those 72 hours, you will need a separate prescription to fill the remaining amount, which we will NOT provide. If the reason for the partial fill is your insurance, you will need to talk to the pharmacist about payment  alternatives for the remaining tablets, but again, DO NOT ACCEPT A PARTIAL FILL, unless you can trust your pharmacist to obtain the remainder of the pills within 72 hours.  Prescription refills and/or changes in medication(s):  Prescription refills, and/or changes in dose or medication, will be conducted only during scheduled medication management appointments. (Applies to both, written and electronic prescriptions.) No refills on procedure days. No medication will be changed or started on procedure days. No changes, adjustments, and/or refills will be conducted on a procedure day. Doing so will interfere with the diagnostic portion of the procedure. No phone refills. No medications will be "called into the pharmacy". No Fax refills. No weekend refills. No Holliday refills. No after hours refills.  Remember:  Business hours are:  Monday to Thursday 8:00 AM to 4:00 PM Provider's Schedule: Delano Metz, MD - Appointments are:  Medication management: Monday and Wednesday 8:00 AM to 4:00 PM Procedure day: Tuesday and Thursday 7:30 AM to 4:00 PM Edward Jolly, MD - Appointments are:  Medication management: Tuesday and Thursday 8:00 AM to 4:00 PM Procedure day: Monday and Wednesday 7:30 AM to 4:00 PM (Last update: 12/11/2021) ______________________________________________________________________      ______________________________________________________________________     Naloxone Nasal Spray  Why am I receiving this medication? [place] STOP ACT requires that all patients taking high dose opioids or at risk of opioids respiratory depression, be prescribed an opioid reversal agent, such as Naloxone (AKA: Narcan).  What is this medication? NALOXONE (nal OX one) treats opioid overdose, which causes slow or shallow breathing, severe drowsiness, or trouble staying awake. Call emergency services after using this medication. You may need additional treatment. Naloxone works by  reversing the effects of opioids. It belongs to a group of medications called opioid blockers.  COMMON BRAND NAME(S): Kloxxado, Narcan  What should I tell my care team before I take this medication? They need to know if you have any of these conditions: Heart disease Substance use disorder An unusual or allergic reaction to naloxone, other medications, foods, dyes, or preservatives Pregnant or trying to get pregnant Breast-feeding  When to use this medication? This medication is to be used for the treatment of respiratory depression (less than 8 breaths per minute) secondary to opioid overdose.   How to use this medication? This medication is for use in the nose. Lay the person on their back. Support their neck with your hand and allow the head to tilt back before giving the medication. The nasal spray should be given into 1 nostril. After giving the medication, move the person onto their side. Do not remove or test the nasal spray until ready to use. Get emergency medical help right away after giving the first dose of this medication, even if the person wakes up. You should be familiar with how to recognize the signs and symptoms of a narcotic overdose. If more doses are needed, give the additional dose in the other nostril. Talk to your care team about the use of this medication in children. While this medication may be prescribed for children as young as newborns for selected conditions, precautions do apply.  Naloxone Overdosage: If you think you have taken too much of this medicine contact a poison control center or emergency room at once.  NOTE: This medicine is only for you. Do not share this medicine with others.  What if I miss a dose? This does not apply.  What may interact with this medication? This is only used during an emergency. No interactions are expected during emergency use. This list may not describe all possible interactions. Give your health care provider a list of  all the medicines, herbs, non-prescription drugs, or dietary supplements you use. Also tell them if you smoke, drink alcohol, or use illegal drugs. Some items may interact with your medicine.  What should I watch for while using this medication? Keep this medication ready for use in the case of an opioid overdose. Make sure that you have the phone number of your care team and local hospital ready. You may need to have additional doses of this medication. Each nasal spray contains a single dose. Some emergencies may require additional doses. After use, bring the treated person to the nearest  hospital or call 911. Make sure the treating care team knows that the person has received a dose of this medication. You will receive additional instructions on what to do during and after use of this medication before an emergency occurs.  What side effects may I notice from receiving this medication? Side effects that you should report to your care team as soon as possible: Allergic reactions--skin rash, itching, hives, swelling of the face, lips, tongue, or throat Side effects that usually do not require medical attention (report these to your care team if they continue or are bothersome): Constipation Dryness or irritation inside the nose Headache Increase in blood pressure Muscle spasms Stuffy nose Toothache This list may not describe all possible side effects. Call your doctor for medical advice about side effects. You may report side effects to FDA at 1-800-FDA-1088.  Where should I keep my medication? Because this is an emergency medication, you should keep it with you at all times.  Keep out of the reach of children and pets. Store between 20 and 25 degrees C (68 and 77 degrees F). Do not freeze. Throw away any unused medication after the expiration date. Keep in original box until ready to use.  NOTE: This sheet is a summary. It may not cover all possible information. If you have questions about  this medicine, talk to your doctor, pharmacist, or health care provider.   2023 Elsevier/Gold Standard (2020-10-13 00:00:00)  ______________________________________________________________________

## 2023-01-02 IMAGING — DX DG CHEST 1V PORT
1 series · 1 of 1 positions shown · non-contrast
Comparison: Chest x-ray dated April 23, 2020

CLINICAL DATA: Weakness

EXAM:
PORTABLE CHEST 1 VIEW

[chest ap]
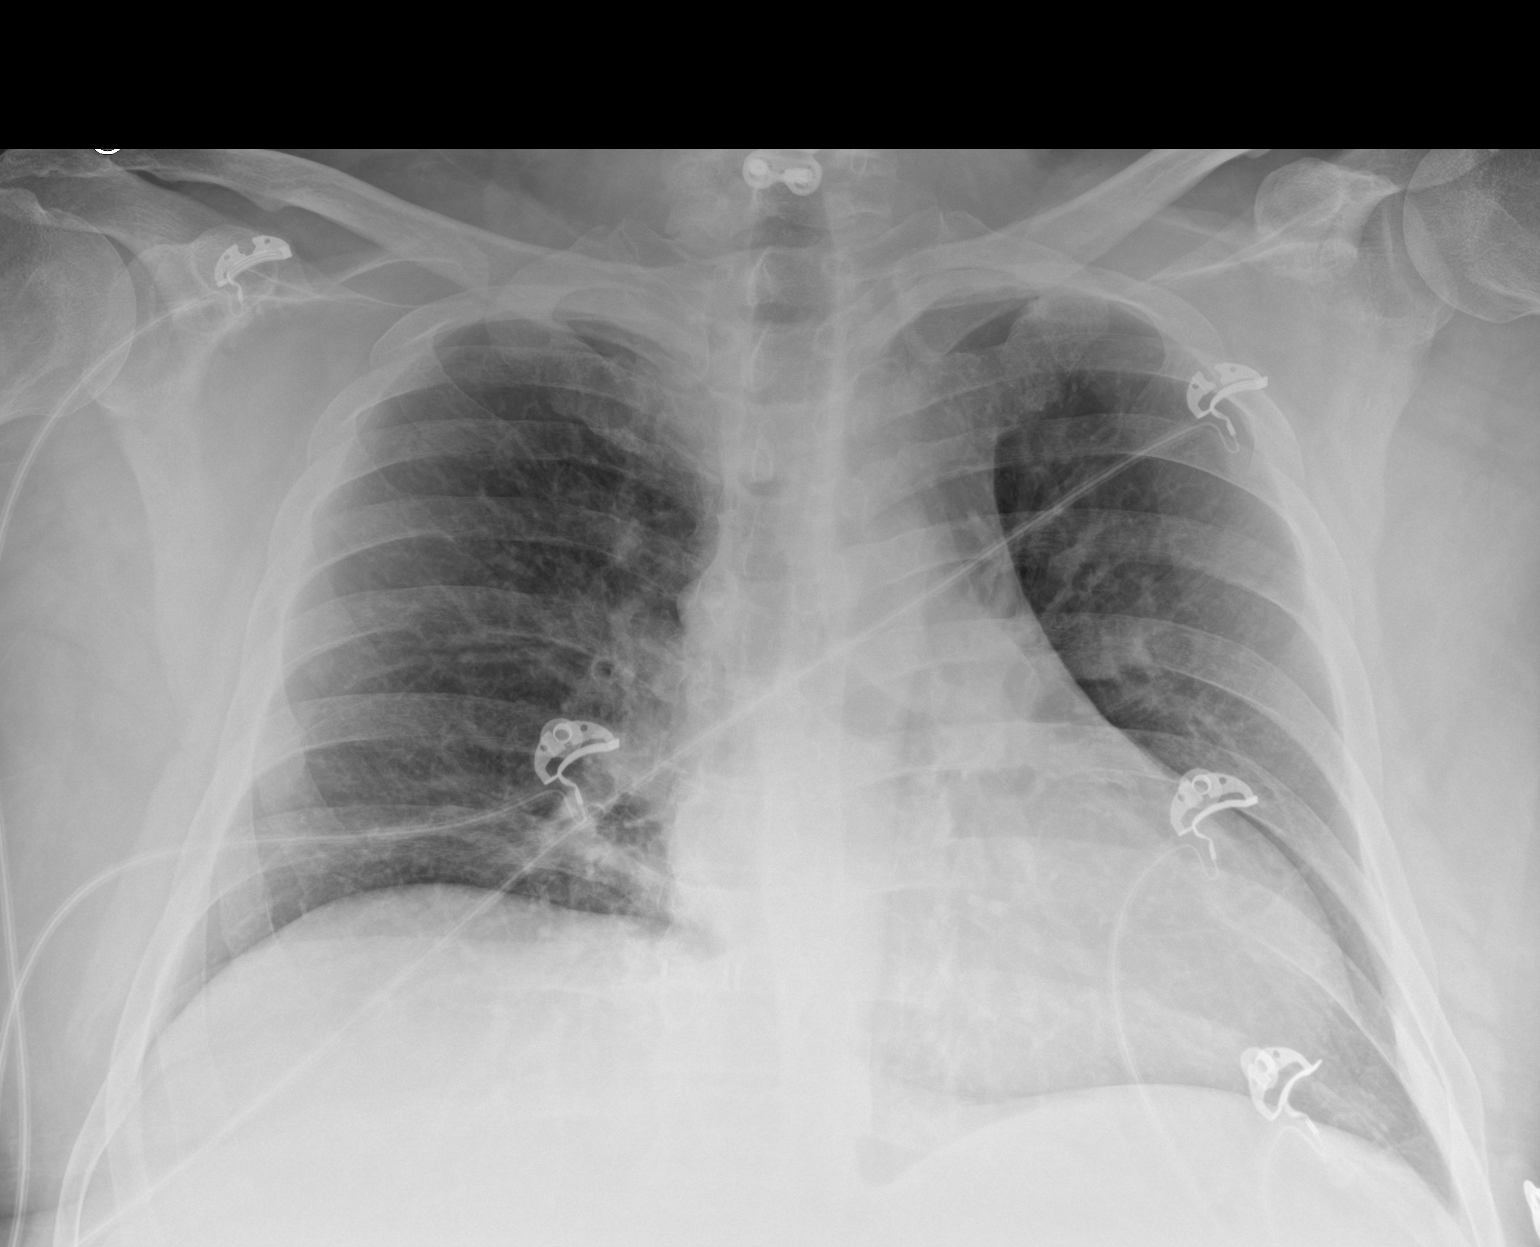

[1 of 1 positions shown; findings below may reference images not displayed]

FINDINGS: Unchanged mild cardiomegaly. Bibasilar atelectasis. Lungs otherwise
clear. No evidence of pleural effusion or pneumothorax.
IMPRESSION: Basilar atelectasis.  Lungs otherwise clear.

## 2023-01-07 ENCOUNTER — Encounter: Payer: Self-pay | Admitting: Pain Medicine

## 2023-01-07 ENCOUNTER — Ambulatory Visit: Payer: Medicare Other | Attending: Pain Medicine | Admitting: Pain Medicine

## 2023-01-07 VITALS — BP 113/81 | HR 75 | Temp 97.5°F | Resp 18 | Ht 69.0 in | Wt 206.0 lb

## 2023-01-07 DIAGNOSIS — M25562 Pain in left knee: Secondary | ICD-10-CM | POA: Insufficient documentation

## 2023-01-07 DIAGNOSIS — Z7901 Long term (current) use of anticoagulants: Secondary | ICD-10-CM | POA: Insufficient documentation

## 2023-01-07 DIAGNOSIS — G8929 Other chronic pain: Secondary | ICD-10-CM | POA: Diagnosis present

## 2023-01-07 DIAGNOSIS — M1712 Unilateral primary osteoarthritis, left knee: Secondary | ICD-10-CM | POA: Insufficient documentation

## 2023-01-07 MED ORDER — ROPIVACAINE HCL 2 MG/ML IJ SOLN: 3.0000 mL | Freq: Once | INTRAMUSCULAR | Status: DC

## 2023-01-07 MED ORDER — TRIAMCINOLONE ACETONIDE 32 MG IX SRER
32.0000 mg | Freq: Once | INTRA_ARTICULAR | Status: DC
  Filled 2023-01-07: qty 5

## 2023-01-07 MED ORDER — LIDOCAINE HCL (PF) 2 % IJ SOLN: 5.0000 mL | Freq: Once | INTRAMUSCULAR | Status: DC

## 2023-01-07 MED ORDER — PENTAFLUOROPROP-TETRAFLUOROETH EX AERO: INHALATION_SPRAY | Freq: Once | CUTANEOUS | Status: DC

## 2023-01-07 NOTE — Patient Instructions (Signed)
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
## 2023-02-15 IMAGING — CR DG ANKLE COMPLETE 3+V*R*
3 series · 3 of 3 positions shown · non-contrast
Comparison: Right ankle x-ray 10/16/2020.

CLINICAL DATA: Swelling.

EXAM:
RIGHT ANKLE - COMPLETE 3+ VIEW

[x ankle ap right]
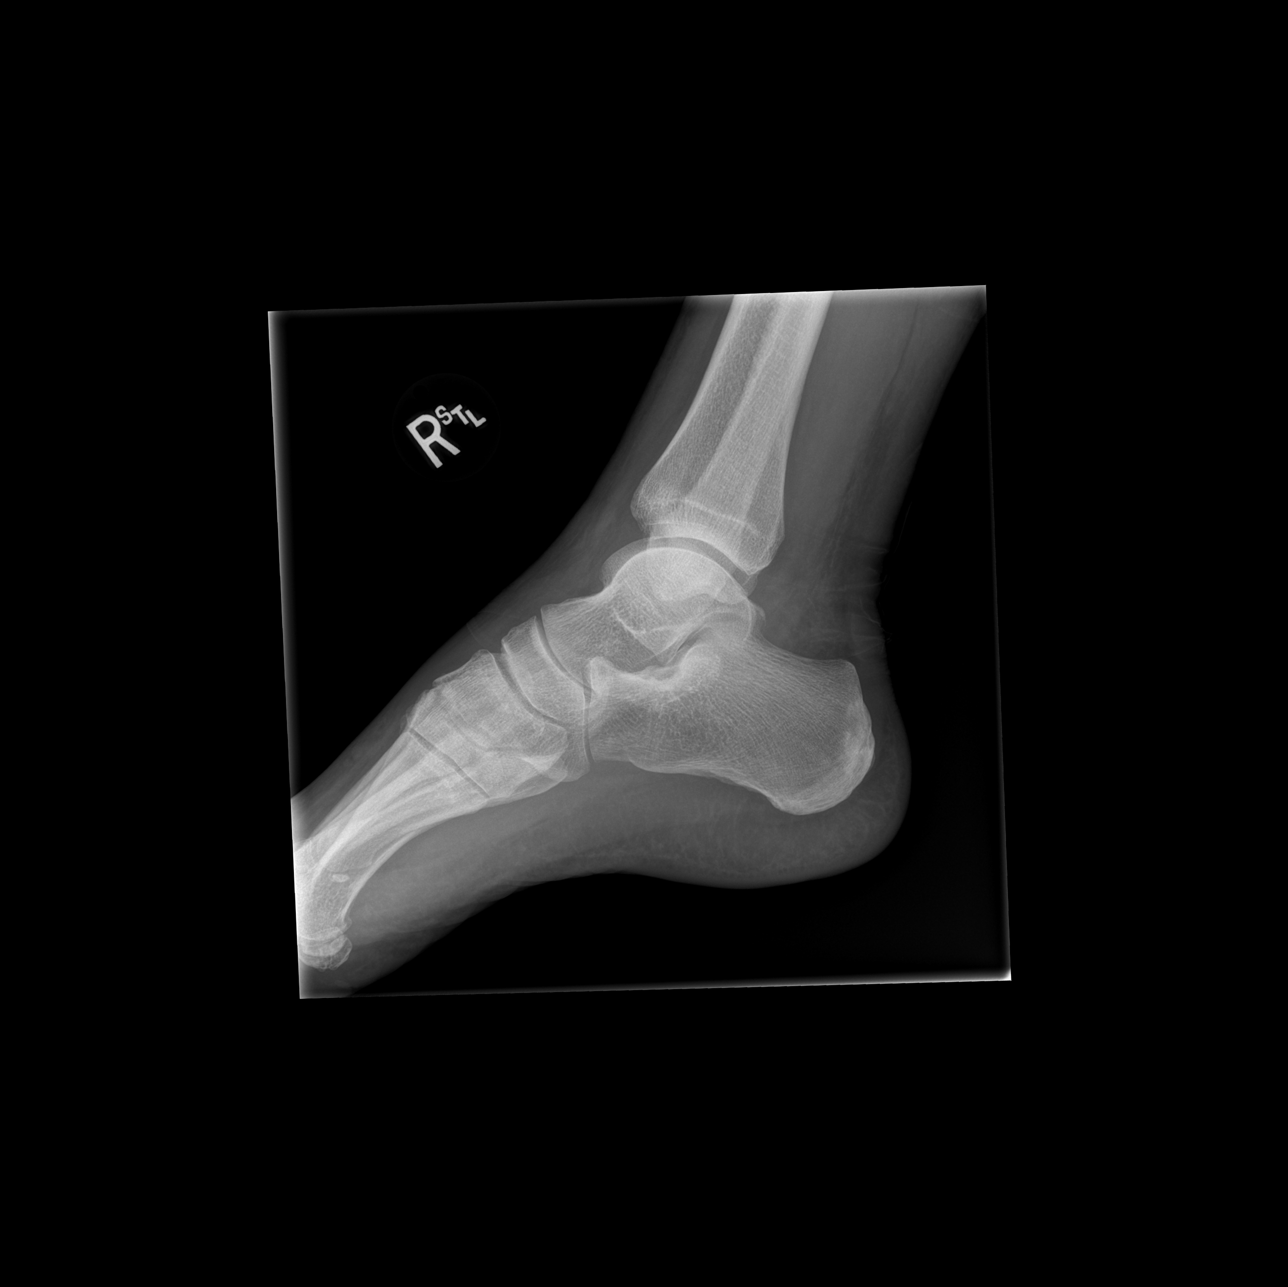

[x ankle obl right (1 of 2)]
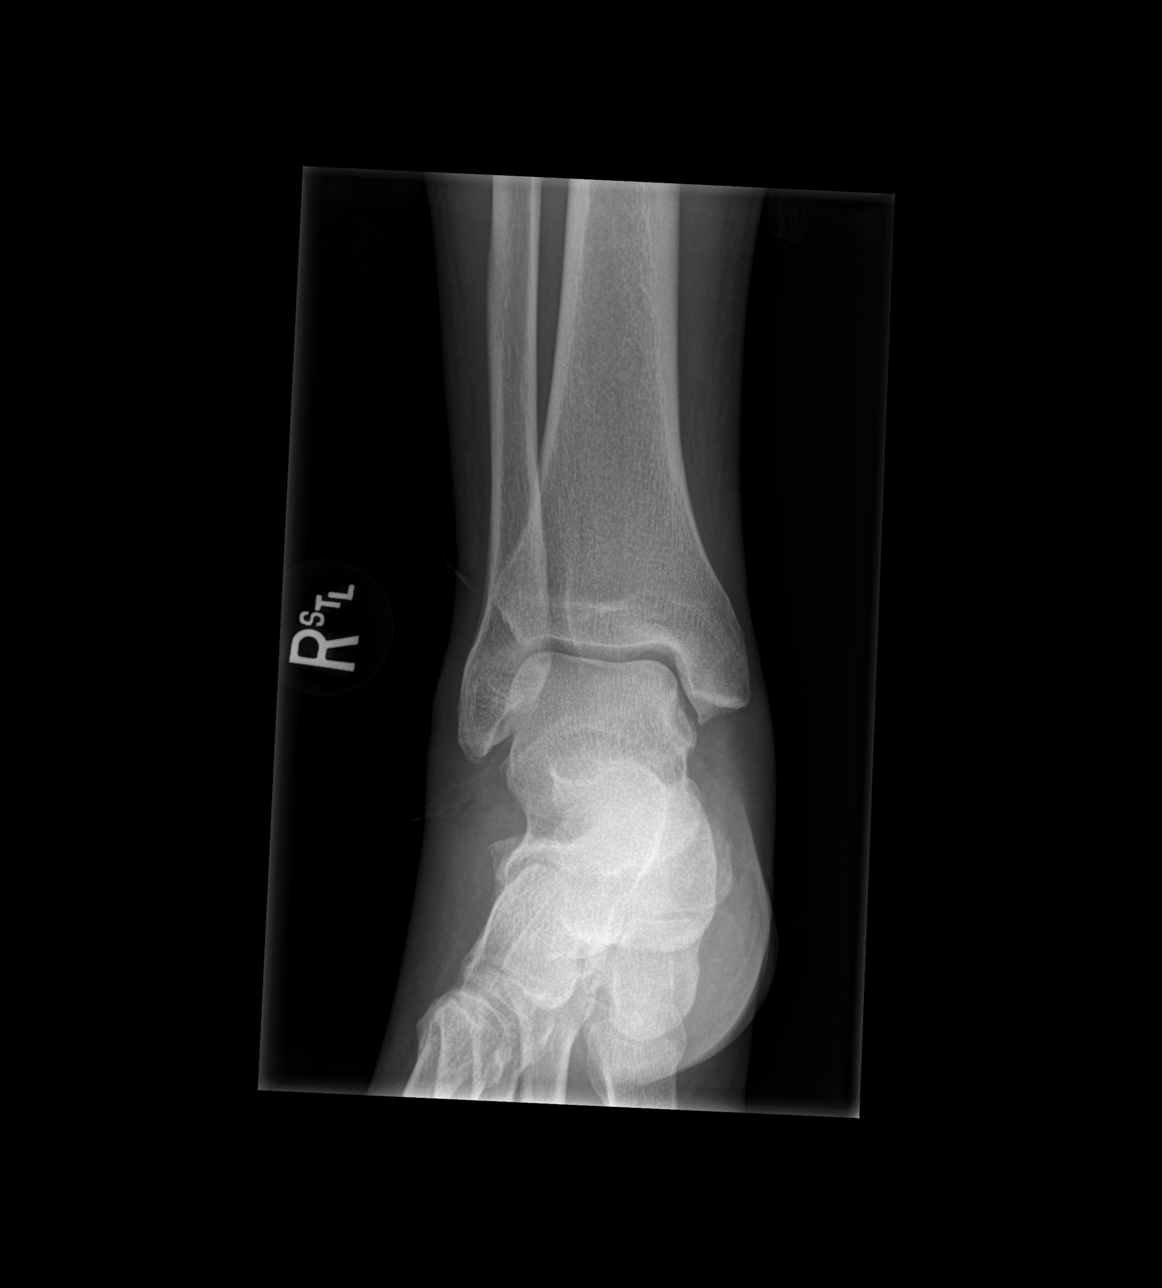

[x ankle obl right (2 of 2)]
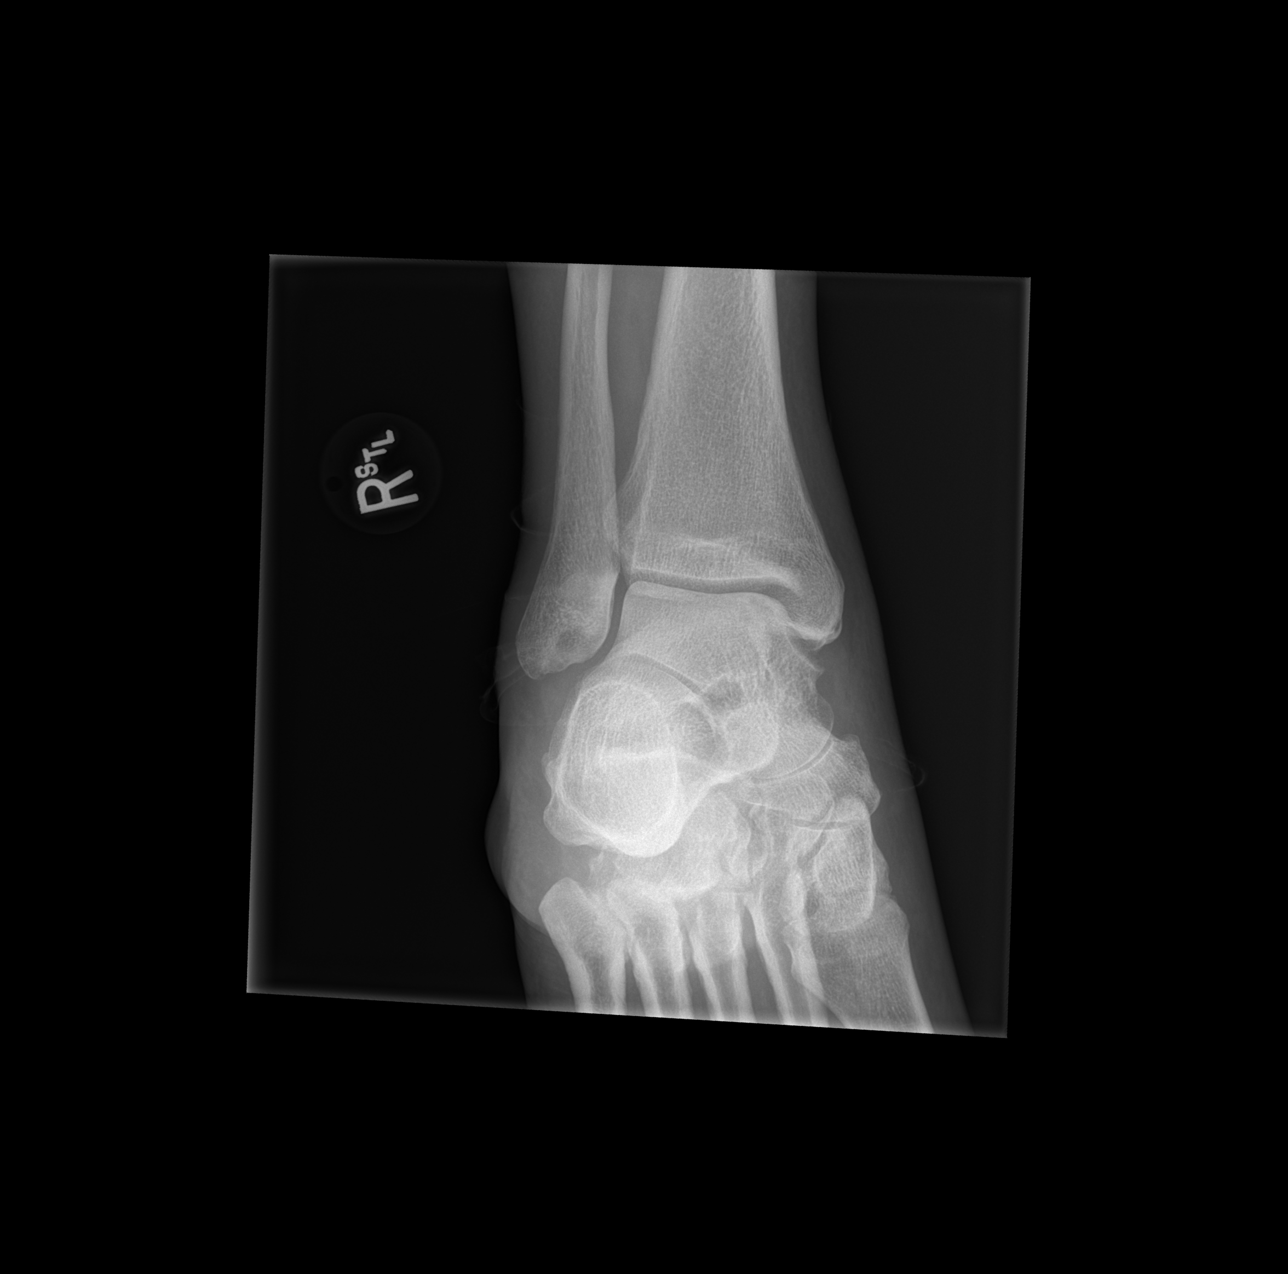

[3 of 3 positions shown; findings below may reference images not displayed]

FINDINGS: There is no evidence of fracture, dislocation, or joint effusion.
There is no evidence of arthropathy or other focal bone abnormality.
There is soft tissue swelling surrounding the ankle.
IMPRESSION: No acute fracture or dislocation of the right ankle.

## 2023-03-17 NOTE — Progress Notes (Unsigned)
PROVIDER NOTE: Information contained herein reflects review and annotations entered in association with encounter. Interpretation of such information and data should be left to medically-trained personnel. Information provided to patient can be located elsewhere in the medical record under "Patient Instructions". Document created using STT-dictation technology, any transcriptional errors that may result from process are unintentional.    Patient: Kenneth Osborn  Service Category: E/M  Provider: Oswaldo Done, MD  DOB: 24-Oct-1955  DOS: 03/19/2023  Referring Provider: Jerl Mina, MD  MRN: [id number]  Specialty: Interventional Pain Management  PCP: Jerl Mina, MD  Type: Established Patient  Setting: Ambulatory outpatient    Location: Office  Delivery: Face-to-face     HPI  Kenneth Osborn, a 68 y.o. year old male, is here today because of his No primary diagnosis found.. Kenneth Osborn's primary complain today is No chief complaint on file.  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 [facility] on 01/04/2013); Neurogenic pain; Thoracic facet syndrome (T8-10); Lumbar facet syndrome (Bilateral) (R>L); Cervical facet syndrome (Right); Cervical spondylosis; Lumbar spondylosis; Chronic upper extremity pain (Left); Chronic cervical radicular pain (Left); Chronic upper back pain; History of thoracic spine surgery (S/P T9-10 IVD spacer); Failed back surgical syndrome; Chronic musculoskeletal pain; Chronic lower extremity pain (Left); Chronic Lumbar radicular pain (Left: L4); Arthralgia of shoulder; Chronic tension-type headache, not intractable; 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); Chronic pain syndrome; Headache disorder; Trigger point posterior superior iliac spine (PSIS)  (Right); 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); Polyneuropathy; Type 2 diabetes mellitus with diabetic polyneuropathy, with long-term current use of insulin (HCC); Spondylosis without myelopathy or radiculopathy, lumbosacral region; Frequent falls; Diabetic polyneuropathy associated with diabetes mellitus due to underlying condition (HCC); Neuropathic pain; Musculoskeletal pain; DDD (degenerative disc disease), lumbosacral; Chronic low back pain (Bilateral) w/o sciatica; Renal mass; 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; Malignant neoplasm of left kidney, except renal pelvis (HCC); Chronic low back pain (Right) w/ sciatica (Right); Chronic lower extremity pain (Right); Chronic Lumbar radicular pain (Right: S1); Epidural fibrosis; Acute pain of left knee; Chronic knee pain (Left); Osteoarthritis of knee (Left); Osteoarthritis of patellofemoral joint (Left); Baker's cyst (tiny) (Left); Lumbar facet joint pain; and Abnormal MRI, lumbar spine (12/24/2013) on their pertinent problem list. Pain Assessment: Severity of   is reported as a  /10. Location:    / . Onset:  . Quality:  . Timing:  . Modifying factor(s):  Marland Kitchen Vitals:  vitals were not taken for this visit.  BMI: Estimated body mass index is 30.42 kg/m as calculated from the following:   Height as of 01/07/23: 5\' 9"  (1.753 m).   Weight as of 01/07/23: 206 lb (93.4 kg). Last encounter: 12/18/2022. Last procedure: 01/07/2023.  Reason for encounter: medication management. ***  Discussed the use of AI scribe software for clinical note transcription with the patient, who gave verbal consent to proceed.  History of Present Illness         RTCB: 06/29/2023   Pharmacotherapy Assessment  Analgesic: Oxycodone IR 5 mg, 1 tab PO TID MME/day: 37.5 mg/day.   Monitoring: [place] PMP: PDMP reviewed during this encounter.       Pharmacotherapy: No side-effects or adverse reactions reported. Compliance: No problems  identified. Effectiveness: Clinically acceptable.  No notes on file  No results found for: "CBDTHCR"  No results found for: "D8THCCBX" No results found for: "D9THCCBX"  UDS:  Summary  Date Value Ref Range Status  09/02/2022 Note  Final    Comment:    ==================================================================== [employer] Select 13 (MW) ==================================================================== Test                             Result       Flag       Units  Drug Present and Declared for Prescription Verification   Desmethyldiazepam              230          EXPECTED   ng/mg creat   Oxazepam                       653          EXPECTED   ng/mg creat   Temazepam                      360          EXPECTED   ng/mg creat    Desmethyldiazepam, oxazepam, and temazepam are expected metabolites    of diazepam. Desmethyldiazepam and oxazepam are also expected    metabolites of other drugs, including chlordiazepoxide, prazepam,    clorazepate, and halazepam. Oxazepam is an expected metabolite of    temazepam. Oxazepam and temazepam are also available as scheduled    prescription medications.    Oxycodone                      587          EXPECTED   ng/mg creat   Oxymorphone                    482          EXPECTED   ng/mg creat   Noroxycodone                   265          EXPECTED   ng/mg creat   Noroxymorphone                 70           EXPECTED   ng/mg creat    Sources of oxycodone are scheduled prescription medications.    Oxymorphone, noroxycodone, and noroxymorphone are expected    metabolites of oxycodone. Oxymorphone is also available as a    scheduled prescription medication.  Drug Absent but Declared for Prescription Verification   Alprazolam                     Not Detected UNEXPECTED ng/mg creat ==================================================================== Test                      Result    Flag   Units      Ref Range   Creatinine              83                mg/dL      >=11 ==================================================================== Declared Medications:  The flagging and interpretation on this report are based on the  following declared medications.  Unexpected results may arise from  inaccuracies in the declared medications.   **Note: The testing scope of this panel includes these  medications:   Alprazolam (Xanax)  Diazepam (Valium)  Oxycodone (Roxicodone)   **Note: The testing scope of this panel does not include the  following reported medications:   Aspirin  Cetirizine (Zyrtec)  Cyclobenzaprine (Flexeril)  Diltiazem (Cardizem)  Esomeprazole (Nexium)  Ezetimibe (Zetia)  Gabapentin (Neurontin)  Insulin (Humulin)  Isosorbide (Imdur)  Magnesium (Mag-Ox)  Meclizine (Antivert)  Metformin (Glucophage)  Metoprolol (Toprol)  Montelukast (Singulair)  Naloxone (Narcan)  Nitroglycerin (Nitrostat)  Prasugrel (Effient)  Rosuvastatin (Crestor)  Sennosides (Senokot)  Vitamin B3 ==================================================================== For clinical consultation, please call [phone]. ====================================================================       ROS  Constitutional: Denies any fever or chills Gastrointestinal: No reported hemesis, hematochezia, vomiting, or acute GI distress Musculoskeletal: Denies any acute onset joint swelling, redness, loss of ROM, or weakness Neurological: No reported episodes of acute onset apraxia, aphasia, dysarthria, agnosia, amnesia, paralysis, loss of coordination, or loss of consciousness  Medication Review  ALPRAZolam, Glucagon, Magnesium Oxide, aspirin EC, cetirizine, chlorthalidone, cyclobenzaprine, diazepam, diltiazem, empagliflozin, esomeprazole, ezetimibe, gabapentin, insulin regular human CONCENTRATED, isosorbide mononitrate, latanoprost, meclizine, metFORMIN, metoprolol succinate, montelukast, naloxone, niacin, nitroGLYCERIN, oxyCODONE, prasugrel,  rosuvastatin, and senna-docusate  History Review  Allergy: Kenneth Osborn has no known allergies. Drug: Kenneth Osborn  reports no history of drug use. Alcohol:  reports no history of alcohol use. Tobacco:  reports that he has never smoked. He has never used smokeless tobacco. Social: Kenneth Osborn  reports that he has never smoked. He has never used smokeless tobacco. He reports that he does not drink alcohol and does not use drugs. Medical:  has a past medical history of Acute postoperative pain (12/03/2018), Allergic rhinitis (12/30/2012), Anginal pain (HCC), Anxiety, Bronchitis, Can't get food down (08/12/2014), Cancer (HCC) (01/2020), Chronic back pain, Concussion (09/2015), Coronary artery disease, DDD (degenerative disc disease), cervical, Dehydration symptoms, Diabetes mellitus without complication (HCC), Dysphagia, GERD (gastroesophageal reflux disease), History of Meniere's disease (12/21/2014), History of thoracic spine surgery (S/P T9-10 IVD spacer) (12/21/2014), Hypercholesteremia, Hyperlipidemia, Hypertension, Meniere's disease, Myocardial infarction (HCC) (2012), Neuromuscular disorder (HCC), Renal disorder, Severe sepsis (HCC) (02/07/2020), Short-segment Barrett's esophagus, and Sleep apnea. Surgical: Kenneth Osborn  has a past surgical history that includes Cardiovascular stress test; Cardiac catheterization; Labrinthectomy; mastoid shunt (Bilateral); Back surgery; Shoulder arthroscopy with subacromial decompression (Left, 04/06/2012); ARTHRODESIS ANTERIOR ANTERIOR CERVICLE SPINE (01/04/2013); Colonoscopy with propofol (N/A, 09/19/2014); Esophagogastroduodenoscopy (N/A, 09/19/2014); Savory dilation (N/A, 09/19/2014); Spinal cord stimulator implant (Right); Cardiac catheterization (N/A, 06/15/2015); Cardiac catheterization (N/A, 06/15/2015); Pulse generator implant (N/A, 01/18/2019); Lumbar spinal cord simulator lead removal (Right, 08/09/2019); IR Perc Cholecystostomy (02/09/2020); IR Radiologist Eval & Mgmt  (03/14/2020); Coronary angioplasty; Appendectomy; Robot assisted laparoscopic nephrectomy (Left, 04/20/2020); and Colonoscopy with propofol (N/A, 04/03/2021). Family: family history includes Cancer in his sister; Diabetes in his maternal grandmother, mother, and paternal grandmother; Heart disease in his father, maternal aunt, maternal uncle, and mother.  Laboratory Chemistry Profile   Renal Lab Results  Component Value Date   BUN 37 (H) 11/02/2021   CREATININE 2.18 (H) 11/02/2021   GFRAA 58 (L) 01/08/2019   GFRNONAA 32 (L) 11/02/2021    Hepatic Lab Results  Component Value Date   AST 33 11/01/2021   ALT 31 11/01/2021   ALBUMIN 4.0 11/01/2021   ALKPHOS 79 11/01/2021   HCVAB NON REACTIVE 02/08/2020   LIPASE 28 12/11/2020   AMMONIA 24 02/07/2020    Electrolytes Lab Results  Component Value Date   NA 138 11/02/2021   K 5.9 (H) 11/02/2021   CL 107 11/02/2021   CALCIUM 9.8 11/02/2021  MG 1.6 (L) 11/02/2021   PHOS 3.1 11/09/2020    Bone No results found for: "VD25OH", "VD125OH2TOT", "IO9629BM8", "UX3244WN0", "25OHVITD1", "25OHVITD2", "25OHVITD3", "TESTOFREE", "TESTOSTERONE"  Inflammation (CRP: Acute Phase) (ESR: Chronic Phase) Lab Results  Component Value Date   CRP 1.4 (H) 03/22/2015   ESRSEDRATE 9 03/22/2015   LATICACIDVEN 1.5 12/11/2020         Note: Above Lab results reviewed.  Recent Imaging Review  MR LUMBAR SPINE WO CONTRAST CLINICAL DATA:  Low back pain for over 6 weeks  EXAM: MRI LUMBAR SPINE WITHOUT CONTRAST  TECHNIQUE: Multiplanar, multisequence MR imaging of the lumbar spine was performed. No intravenous contrast was administered.  COMPARISON:  03/12/2013  FINDINGS: Segmentation:  Standard.  Alignment:  Physiologic.  Vertebrae: No acute fracture, evidence of discitis, or aggressive bone lesion.  Conus medullaris and cauda equina: Conus extends to the L1 level. Conus and cauda equina appear normal.  Paraspinal and other soft tissues: No acute  paraspinal abnormality.  Disc levels:  Disc spaces: Disc desiccation throughout the lumbar spine. Disc heights are maintained.  T12-L1: No significant disc bulge. No neural foraminal stenosis. No central canal stenosis.  L1-L2: No significant disc bulge. Mild bilateral facet arthropathy. No foraminal or central canal stenosis.  L2-L3: Mild broad-based disc bulge. Mild bilateral facet arthropathy. No foraminal or central canal stenosis.  L3-L4: Mild broad-based disc bulge. Mild bilateral facet arthropathy. No foraminal or central canal stenosis.  L4-L5: Mild broad-based disc bulge. Mild bilateral facet arthropathy. No foraminal or central canal stenosis.  L5-S1: Broad-based disc bulge. No foraminal or central canal stenosis.  IMPRESSION: 1. Mild lumbar spine spondylosis as described above. 2. No acute osseous injury of the lumbar spine.  Electronically Signed   By: Elige Ko M.D.   On: 11/23/2022 09:28 Note: Reviewed        Physical Exam  General appearance: Well nourished, well developed, and well hydrated. In no apparent acute distress Mental status: Alert, oriented x 3 (person, place, & time)       Respiratory: No evidence of acute respiratory distress Eyes: PERLA Vitals: There were no vitals taken for this visit. BMI: Estimated body mass index is 30.42 kg/m as calculated from the following:   Height as of 01/07/23: 5\' 9"  (1.753 m).   Weight as of 01/07/23: 206 lb (93.4 kg). Ideal: Patient weight not recorded  Assessment   Diagnosis Status  1. Cervical facet syndrome (Right)   2. 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 Carolyn Stare at [facility] on 01/04/2013)   3. Thoracic facet syndrome (T8-10)   4. Chronic upper back pain   5. Chronic musculoskeletal pain   6. Failed back surgical syndrome   7. Chronic lower extremity pain (Left)   8. Chronic upper extremity pain (Left)   9. Chronic low back pain (Bilateral) w/o sciatica   10. Chronic pain  syndrome   11. Pharmacologic therapy   12. Chronic use of opiate for therapeutic purpose   13. Encounter for medication management   14. Encounter for chronic pain management   15. Long term prescription benzodiazepine use    Controlled Controlled Controlled   Updated Problems: Problem  Hypertensive Kidney Disease, Malignant, Stage 1-4 Or Unspecified Chronic Kidney Disease    Plan of Care  Problem-specific:  Assessment and Plan            Kenneth Osborn has a current medication list which includes the following long-term medication(s): cetirizine, chlorthalidone, cyclobenzaprine, esomeprazole, ezetimibe, gabapentin, humulin r  u-500 kwikpen, metoprolol succinate, montelukast, niacin, and oxycodone.  Pharmacotherapy (Medications Ordered): No orders of the defined types were placed in this encounter.  Orders:  No orders of the defined types were placed in this encounter.  Follow-up plan:   No follow-ups on file.      Interventional Therapies  Risk Factors  Considerations:   EFFIENT Anticoagulation: (Stop: 7-10 days  Re-start: 6 hrs)  Mnire's disease  OSA  GERD  CAD  (B) Carotid Artery Stenosis  s/p CABG  Hx. Cardiac Syncope  T2IDDM  Hx. MI (06/2010)  Unstable Angina  Lumbar SCS: implanted on 01/18/2019 and removed on 08/09/2019.   Planned  Pending:   Today I have ordered a repeat MRI of the lumbar spine since his last MRI was in 2015 and his condition continues to worsen and he is response to interventional therapies has been somewhat limited. Diagnostic/therapeutic left suprapatellar intra-articular steroid patellofemoral knee injection #1   Under consideration:   Diagnostic/therapeutic left suprapatellar intra-articular steroid patellofemoral knee injection #1 CYP2D6 / CYP3A4 genetic testing.   Completed:   Diagnostic/therapeutic left IA steroid knee inj. x1 (06/04/2022) (100/75/50/50) Therapeutic right Racz procedure x1 (12/18/2021)  (100/100/70/70-100)  Diagnostic/therapeutic right caudal ESI x1 (11/13/2021)  Palliative right PSIS MNB/TPI x3 (11/13/2017) (100/100/100)  Diagnostic/palliative right lumbar facet MBB x2 (09/22/2018) (100/100/75/75)  Diagnostic/palliative left lumbar facet MBB x2 (09/22/2018) (100/100/75/75)  Palliative left lumbar facet RFA x3 (02/20/2021) (85/85/55/60-70)  Palliative right lumbar facet RFA x3 (01/09/2021) (100/95/50/60-70)    Therapeutic  Palliative (PRN) options:   Palliative right PSIS MNB/TPI  Diagnostic/palliative lumbar facet MBB  Palliative lumbar facet RFA    Pharmacotherapy  Nonopioids transferred 11/22/2019: Magnesium, Flexeril, and Neurontin. Last "Drug Holiday": See 05/06/2022 note.  (To be completed on 05/27/2022)      Recent Visits Date Type Provider Dept  01/07/23 Procedure visit Delano Metz, MD [facility]  12/18/22 Office Visit Delano Metz, MD [facility]  Showing recent visits within past 90 days and meeting all other requirements Future Appointments Date Type Provider Dept  03/19/23 Appointment Delano Metz, MD [facility]  Showing future appointments within next 90 days and meeting all other requirements  I discussed the assessment and treatment plan with the patient. The patient was provided an opportunity to ask questions and all were answered. The patient agreed with the plan and demonstrated an understanding of the instructions.  Patient advised to call back or seek an in-person evaluation if the symptoms or condition worsens.  Duration of encounter: *** minutes.  Total time on encounter, as per AMA guidelines included both the face-to-face and non-face-to-face time personally spent by the physician and/or other qualified health care professional(s) on the day of the encounter (includes time in activities that require the physician or other qualified health care professional and does not include time in activities  normally performed by clinical staff). Physician's time may include the following activities when performed: Preparing to see the patient (e.g., pre-charting review of records, searching for previously ordered imaging, lab work, and nerve conduction tests) Review of prior analgesic pharmacotherapies. Reviewing PMP Interpreting ordered tests (e.g., lab work, imaging, nerve conduction tests) Performing post-procedure evaluations, including interpretation of diagnostic procedures Obtaining and/or reviewing separately obtained history Performing a medically appropriate examination and/or evaluation Counseling and educating the patient/family/caregiver Ordering medications, tests, or procedures Referring and communicating with other health care professionals (when not separately reported) Documenting clinical information in the electronic or other health record Independently interpreting results (not separately reported) and  communicating results to the patient/ family/caregiver Care coordination (not separately reported)  Note by: Oswaldo Done, MD Date: 03/19/2023; Time: 2:38 PM

## 2023-03-17 NOTE — Patient Instructions (Signed)

## 2023-03-19 ENCOUNTER — Encounter: Payer: Self-pay | Admitting: Pain Medicine

## 2023-03-19 ENCOUNTER — Ambulatory Visit: Payer: Medicare Other | Attending: Pain Medicine | Admitting: Pain Medicine

## 2023-03-19 DIAGNOSIS — M47894 Other spondylosis, thoracic region: Secondary | ICD-10-CM | POA: Diagnosis not present

## 2023-03-19 DIAGNOSIS — M79602 Pain in left arm: Secondary | ICD-10-CM | POA: Diagnosis present

## 2023-03-19 DIAGNOSIS — M7918 Myalgia, other site: Secondary | ICD-10-CM | POA: Diagnosis present

## 2023-03-19 DIAGNOSIS — Z79891 Long term (current) use of opiate analgesic: Secondary | ICD-10-CM | POA: Insufficient documentation

## 2023-03-19 DIAGNOSIS — M545 Low back pain, unspecified: Secondary | ICD-10-CM | POA: Insufficient documentation

## 2023-03-19 DIAGNOSIS — G894 Chronic pain syndrome: Secondary | ICD-10-CM | POA: Insufficient documentation

## 2023-03-19 DIAGNOSIS — M79605 Pain in left leg: Secondary | ICD-10-CM | POA: Insufficient documentation

## 2023-03-19 DIAGNOSIS — G8929 Other chronic pain: Secondary | ICD-10-CM | POA: Insufficient documentation

## 2023-03-19 DIAGNOSIS — M549 Dorsalgia, unspecified: Secondary | ICD-10-CM | POA: Insufficient documentation

## 2023-03-19 DIAGNOSIS — M961 Postlaminectomy syndrome, not elsewhere classified: Secondary | ICD-10-CM | POA: Insufficient documentation

## 2023-03-19 DIAGNOSIS — Z79899 Other long term (current) drug therapy: Secondary | ICD-10-CM | POA: Diagnosis present

## 2023-03-19 DIAGNOSIS — M47812 Spondylosis without myelopathy or radiculopathy, cervical region: Secondary | ICD-10-CM | POA: Diagnosis not present

## 2023-03-19 MED ORDER — OXYCODONE HCL 5 MG PO TABS: 5.0000 mg | ORAL_TABLET | Freq: Three times a day (TID) | ORAL | 0 refills | Status: DC

## 2023-03-19 NOTE — Progress Notes (Signed)
Nursing Pain Medication Assessment:  Safety precautions to be maintained throughout the outpatient stay will include: orient to surroundings, keep bed in low position, maintain call bell within reach at all times, provide assistance with transfer out of bed and ambulation.   Medication Inspection Compliance: Pill count conducted under aseptic conditions, in front of the patient. Neither the pills nor the bottle was removed from the patient's sight at any time. Once count was completed pills were immediately returned to the patient in their original bottle.  Medication: Oxycodone IR Pill/Patch Count:  75 of 90 pills remain Pill/Patch Appearance: Markings consistent with prescribed medication Bottle Appearance: Standard pharmacy container. Clearly labeled. Filled Date: 01 / 24 / 2025 Last Medication intake:  Today

## 2023-03-25 ENCOUNTER — Encounter: Payer: Self-pay | Admitting: Internal Medicine

## 2023-04-23 ENCOUNTER — Other Ambulatory Visit: Payer: Self-pay | Admitting: Gastroenterology

## 2023-04-23 DIAGNOSIS — R1319 Other dysphagia: Secondary | ICD-10-CM

## 2023-05-02 ENCOUNTER — Ambulatory Visit
Admission: RE | Admit: 2023-05-02 | Discharge: 2023-05-02 | Disposition: A | Discharge status: Home / Self Care | Source: Ambulatory Visit | Attending: Gastroenterology | Admitting: Gastroenterology

## 2023-05-02 DIAGNOSIS — R1319 Other dysphagia: Secondary | ICD-10-CM | POA: Diagnosis present

## 2023-06-19 NOTE — Progress Notes (Unsigned)
 PROVIDER NOTE: Interpretation of information contained herein should be left to medically-trained personnel. Specific patient instructions are provided elsewhere under "Patient Instructions" section of medical record. This document was created in part using AI and STT-dictation technology, any transcriptional errors that may result from this process are unintentional.  Patient: Kenneth Osborn  Service: E/M   PCP: Kenneth San, MD  DOB: [birth year]  DOS: 06/23/2023  Provider: Cherylin Corrigan, NP  MRN: [id number]  Delivery: Face-to-face  Specialty: Interventional Pain Management  Type: Established Patient  Setting: Ambulatory outpatient facility  Specialty designation: 09  Referring Prov.: Kenneth San, MD  Location: Outpatient office facility       HPI  Kenneth Osborn, a 68 y.o. year old male, is here today because of his No primary diagnosis found.. Kenneth Osborn primary complain today is No chief complaint on file.   Pain Assessment: Severity of   is reported as a  /10. Location:    / . Onset:  . Quality:  . Timing:  . Modifying factor(s):  Kenneth Osborn Vitals:  vitals were not taken for this visit.  BMI: Estimated body mass index is 31.01 kg/m as calculated from the following:   Height as of 03/19/23: 5\' 9"  (1.753 m).   Weight as of 03/19/23: 210 lb (95.3 kg). Last encounter: 03/19/2023.  Reason for encounter: medication management. ***  Discussed the use of AI scribe software for clinical note transcription with the patient, who gave verbal consent to proceed.  History of Present Illness           Pharmacotherapy Assessment  Analgesic: {There is no content from the last Subjective section.}   Monitoring: [place] PMP: PDMP not reviewed this encounter.       Pharmacotherapy: No side-effects or adverse reactions reported. Compliance: No problems identified. Effectiveness: Clinically acceptable.  No notes on file  No results found for: "CBDTHCR" No results found for: "D8THCCBX" No results  found for: "D9THCCBX"  UDS:  Summary  Date Value Ref Range Status  09/02/2022 Note  Final    Comment:    ==================================================================== [employer] (MW) ==================================================================== Test                             Result       Flag       Units  Drug Present and Declared for Prescription Verification   Desmethyldiazepam              230          EXPECTED   ng/mg creat   Oxazepam                       653          EXPECTED   ng/mg creat   Temazepam                      360          EXPECTED   ng/mg creat    Desmethyldiazepam, oxazepam, and temazepam are expected metabolites    of diazepam . Desmethyldiazepam and oxazepam are also expected    metabolites of other drugs, including chlordiazepoxide, prazepam,    clorazepate, and halazepam. Oxazepam is an expected metabolite of    temazepam. Oxazepam and temazepam are also available as scheduled    prescription medications.    Oxycodone   587          EXPECTED   ng/mg creat   Oxymorphone                    482          EXPECTED   ng/mg creat   Noroxycodone                   265          EXPECTED   ng/mg creat   Noroxymorphone                 70           EXPECTED   ng/mg creat    Sources of oxycodone  are scheduled prescription medications.    Oxymorphone, noroxycodone, and noroxymorphone are expected    metabolites of oxycodone . Oxymorphone is also available as a    scheduled prescription medication.  Drug Absent but Declared for Prescription Verification   Alprazolam                      Not Detected UNEXPECTED ng/mg creat ==================================================================== Test                      Result    Flag   Units      Ref Range   Creatinine              83               mg/dL      >=16 ==================================================================== Declared Medications:  The flagging and interpretation  on this report are based on the  following declared medications.  Unexpected results may arise from  inaccuracies in the declared medications.   **Note: The testing scope of this panel includes these medications:   Alprazolam  (Xanax )  Diazepam  (Valium )  Oxycodone  (Roxicodone )   **Note: The testing scope of this panel does not include the  following reported medications:   Aspirin   Cetirizine (Zyrtec)  Cyclobenzaprine  (Flexeril )  Diltiazem  (Cardizem )  Esomeprazole (Nexium)  Ezetimibe  (Zetia )  Gabapentin  (Neurontin )  Insulin  (Humulin )  Isosorbide  (Imdur )  Magnesium  (Mag-Ox)  Meclizine  (Antivert )  Metformin  (Glucophage )  Metoprolol  (Toprol )  Montelukast  (Singulair )  Naloxone  (Narcan )  Nitroglycerin  (Nitrostat )  Prasugrel  (Effient )  Rosuvastatin  (Crestor )  Sennosides (Senokot)  Vitamin B3 ==================================================================== For clinical consultation, please call [phone]. ====================================================================       ROS  Constitutional: Denies any fever or chills Gastrointestinal: No reported hemesis, hematochezia, vomiting, or acute GI distress Musculoskeletal: Denies any acute onset joint swelling, redness, loss of ROM, or weakness Neurological: No reported episodes of acute onset apraxia, aphasia, dysarthria, agnosia, amnesia, paralysis, loss of coordination, or loss of consciousness  Medication Review  ALPRAZolam , Glucagon, Magnesium  Oxide, aspirin  EC, cetirizine, chlorthalidone, cyclobenzaprine , diazepam , diltiazem , empagliflozin, esomeprazole, ezetimibe , gabapentin , insulin  regular human CONCENTRATED, isosorbide  mononitrate, latanoprost, meclizine , metFORMIN , metoprolol  succinate, montelukast , naloxone , niacin , nitroGLYCERIN , oxyCODONE , prasugrel , rosuvastatin , and senna-docusate  History Review  Allergy: Kenneth Osborn has no known allergies. Drug: Kenneth Osborn  reports no history of drug use. Alcohol:   reports no history of alcohol use. Tobacco:  reports that he has never smoked. He has never used smokeless tobacco. Social: Kenneth Osborn  reports that he has never smoked. He has never used smokeless tobacco. He reports that he does not drink alcohol and does not use drugs. Medical:  has a past medical history of Acute postoperative pain (12/03/2018), Allergic rhinitis (12/30/2012), Anemia, Anginal pain (HCC),  Anxiety, Bronchitis, Can't get food down (08/12/2014), Cancer (HCC) (01/2020), Chronic back pain, Concussion (09/2015), Coronary artery disease, DDD (degenerative disc disease), cervical, Dehydration symptoms, Diabetes mellitus without complication (HCC), Dysphagia, GERD (gastroesophageal reflux disease), History of Meniere's disease (12/21/2014), History of thoracic spine surgery (S/P T9-10 IVD spacer) (12/21/2014), Hypercalcemia, Hypercholesteremia, Hyperkalemia, Hyperlipidemia, Hypertension, Meniere's disease, Myocardial infarction (HCC) (2012), Neuromuscular disorder (HCC), Proteinuria, Renal cell carcinoma (HCC), Renal disorder, Secondary hyperparathyroidism of renal origin (HCC), Severe sepsis (HCC) (02/07/2020), Short-segment Barrett's esophagus, and Sleep apnea. Surgical: Kenneth Osborn  has a past surgical history that includes Cardiovascular stress test; Cardiac catheterization; Labrinthectomy; mastoid shunt (Bilateral); Back surgery; Shoulder arthroscopy with subacromial decompression (Left, 04/06/2012); ARTHRODESIS ANTERIOR ANTERIOR CERVICLE SPINE (01/04/2013); Colonoscopy with propofol  (N/A, 09/19/2014); Esophagogastroduodenoscopy (N/A, 09/19/2014); Savory dilation (N/A, 09/19/2014); Spinal cord stimulator implant (Right); Cardiac catheterization (N/A, 06/15/2015); Cardiac catheterization (N/A, 06/15/2015); Pulse generator implant (N/A, 01/18/2019); Lumbar spinal cord simulator lead removal (Right, 08/09/2019); IR Perc Cholecystostomy (02/09/2020); IR Radiologist Eval & Mgmt (03/14/2020); Coronary  angioplasty; Appendectomy; Robot assisted laparoscopic nephrectomy (Left, 04/20/2020); Colonoscopy with propofol  (N/A, 04/03/2021); and Nephrectomy (Left). Family: family history includes Cancer in his sister; Diabetes in his maternal grandmother, mother, and paternal grandmother; Heart disease in his father, maternal aunt, maternal uncle, and mother.  Laboratory Chemistry Profile   Renal Lab Results  Component Value Date   BUN 37 (H) 11/02/2021   CREATININE 2.18 (H) 11/02/2021   GFRAA 58 (L) 01/08/2019   GFRNONAA 32 (L) 11/02/2021    Hepatic Lab Results  Component Value Date   AST 33 11/01/2021   ALT 31 11/01/2021   ALBUMIN  4.0 11/01/2021   ALKPHOS 79 11/01/2021   HCVAB NON REACTIVE 02/08/2020   LIPASE 28 12/11/2020   AMMONIA 24 02/07/2020    Electrolytes Lab Results  Component Value Date   NA 138 11/02/2021   K 5.9 (H) 11/02/2021   CL 107 11/02/2021   CALCIUM  9.8 11/02/2021   MG 1.6 (L) 11/02/2021   PHOS 3.1 11/09/2020    Bone No results found for: "VD25OH", "VD125OH2TOT", "WG9562ZH0", "QM5784ON6", "25OHVITD1", "25OHVITD2", "25OHVITD3", "TESTOFREE", "TESTOSTERONE"  Inflammation (CRP: Acute Phase) (ESR: Chronic Phase) Lab Results  Component Value Date   CRP 1.4 (H) 03/22/2015   ESRSEDRATE 9 03/22/2015   LATICACIDVEN 1.5 12/11/2020         Note: Above Lab results reviewed.  Recent Imaging Review  DG ESOPHAGUS W DOUBLE CM (HD) CLINICAL DATA:  Patient with complaints of dysphagia with both liquids and solids.  EXAM: ESOPHAGUS/BARIUM SWALLOW/TABLET STUDY  TECHNIQUE: Combined double and single contrast examination was performed using effervescent crystals, high-density barium, and thin liquid barium. This exam was performed by Jetta Morrow NP, and was supervised and interpreted by Dr. Juliaette Ober.  FLUOROSCOPY: Radiation Exposure Index (as provided by the fluoroscopic device): 48.10 mGy Kerma  COMPARISON:  None Available.  FINDINGS: Swallowing: Flash  vestibular penetration.  Pharynx: Unremarkable.  Esophagus: Small anterior cervical web  Esophageal motility: Moderate dysmotility with tertiary contractions  Hiatal Hernia: None.  Gastroesophageal reflux: Spontaneous reflux observed into the lower esophagus.  Ingested 13mm barium tablet: Passed normally  Other: None.  IMPRESSION: Flash vestibular penetration. Small anterior cervical web. Moderate dysmotility with tertiary contractions. Spontaneous reflux to the level of the lower esophagus.  Electronically Signed   By: Audra Blend M.D.   On: 05/02/2023 12:44 Note: Reviewed        Physical Exam  General appearance: Well nourished, well developed, and well hydrated. In no apparent acute distress Mental status: Alert, oriented  x 3 (person, place, & time)       Respiratory: No evidence of acute respiratory distress Eyes: PERLA Vitals: There were no vitals taken for this visit. BMI: Estimated body mass index is 31.01 kg/m as calculated from the following:   Height as of 03/19/23: 5\' 9"  (1.753 m).   Weight as of 03/19/23: 210 lb (95.3 kg). Ideal: Patient weight not recorded  Assessment   Diagnosis Status  No diagnosis found. Controlled Controlled Controlled   Plan of Care  Assessment and Plan             Pharmacotherapy (Medications Ordered): No orders of the defined types were placed in this encounter.  Orders:  No orders of the defined types were placed in this encounter.  Follow-up plan:   No follow-ups on file.     {There is no content from the last Plan section.}   Recent Visits No visits were found meeting these conditions. Showing recent visits within past 90 days and meeting all other requirements Future Appointments Date Type Provider Dept  06/23/23 Appointment Renaldo Caroli, MD [facility]  Showing future appointments within next 90 days and meeting all other requirements  I discussed the assessment and treatment plan with  the patient. The patient was provided an opportunity to ask questions and all were answered. The patient agreed with the plan and demonstrated an understanding of the instructions.  Patient advised to call back or seek an in-person evaluation if the symptoms or condition worsens.  Duration of encounter: *** minutes.  Total time on encounter, as per AMA guidelines included both the face-to-face and non-face-to-face time personally spent by the physician and/or other qualified health care professional(s) on the day of the encounter (includes time in activities that require the physician or other qualified health care professional and does not include time in activities normally performed by clinical staff). Physician's time may include the following activities when performed: Preparing to see the patient (e.g., pre-charting review of records, searching for previously ordered imaging, lab work, and nerve conduction tests) Review of prior analgesic pharmacotherapies. Reviewing PMP Interpreting ordered tests (e.g., lab work, imaging, nerve conduction tests) Performing post-procedure evaluations, including interpretation of diagnostic procedures Obtaining and/or reviewing separately obtained history Performing a medically appropriate examination and/or evaluation Counseling and educating the patient/family/caregiver Ordering medications, tests, or procedures Referring and communicating with other health care professionals (when not separately reported) Documenting clinical information in the electronic or other health record Independently interpreting results (not separately reported) and communicating results to the patient/ family/caregiver Care coordination (not separately reported)  Note by: Alakai Macbride K Eiden Bagot, NP (TTS and AI technology used. I apologize for any typographical errors that were not detected and corrected.) Date: 06/23/2023; Time: 3:23 PM

## 2023-06-23 ENCOUNTER — Ambulatory Visit: Payer: Medicare Other | Attending: Pain Medicine | Admitting: Pain Medicine

## 2023-06-23 ENCOUNTER — Encounter: Payer: Self-pay | Admitting: Pain Medicine

## 2023-06-23 DIAGNOSIS — G894 Chronic pain syndrome: Secondary | ICD-10-CM

## 2023-06-23 DIAGNOSIS — M7918 Myalgia, other site: Secondary | ICD-10-CM

## 2023-06-23 DIAGNOSIS — G8929 Other chronic pain: Secondary | ICD-10-CM | POA: Diagnosis present

## 2023-06-23 DIAGNOSIS — Z79891 Long term (current) use of opiate analgesic: Secondary | ICD-10-CM | POA: Diagnosis present

## 2023-06-23 DIAGNOSIS — Z79899 Other long term (current) drug therapy: Secondary | ICD-10-CM

## 2023-06-23 DIAGNOSIS — M79602 Pain in left arm: Secondary | ICD-10-CM | POA: Insufficient documentation

## 2023-06-23 DIAGNOSIS — M47812 Spondylosis without myelopathy or radiculopathy, cervical region: Secondary | ICD-10-CM | POA: Diagnosis present

## 2023-06-23 DIAGNOSIS — M549 Dorsalgia, unspecified: Secondary | ICD-10-CM | POA: Insufficient documentation

## 2023-06-23 DIAGNOSIS — M961 Postlaminectomy syndrome, not elsewhere classified: Secondary | ICD-10-CM | POA: Diagnosis not present

## 2023-06-23 DIAGNOSIS — M47894 Other spondylosis, thoracic region: Secondary | ICD-10-CM

## 2023-06-23 DIAGNOSIS — M545 Low back pain, unspecified: Secondary | ICD-10-CM | POA: Diagnosis present

## 2023-06-23 DIAGNOSIS — M79605 Pain in left leg: Secondary | ICD-10-CM | POA: Diagnosis present

## 2023-06-23 MED ORDER — OXYCODONE HCL 5 MG PO TABS
5.0000 mg | ORAL_TABLET | Freq: Three times a day (TID) | ORAL | 0 refills | Status: DC
Start: 2023-07-18 — End: 2023-10-13

## 2023-06-23 MED ORDER — OXYCODONE HCL 5 MG PO TABS: 5.0000 mg | ORAL_TABLET | Freq: Three times a day (TID) | ORAL | 0 refills | Status: DC

## 2023-06-23 NOTE — Progress Notes (Signed)
 Nursing Pain Medication Assessment:  Safety precautions to be maintained throughout the outpatient stay will include: orient to surroundings, keep bed in low position, maintain call bell within reach at all times, provide assistance with transfer out of bed and ambulation.  Medication Inspection Compliance: Pill count conducted under aseptic conditions, in front of the patient. Neither the pills nor the bottle was removed from the patient's sight at any time. Once count was completed pills were immediately returned to the patient in their original bottle.  Medication: Oxycodone  IR Pill/Patch Count:  2 of 90 pills remain Pill/Patch Appearance: Markings consistent with prescribed medication Bottle Appearance: Standard pharmacy container. Clearly labeled. Filled Date: 04 / 02 / 2025 Last Medication intake:  Today

## 2023-06-25 ENCOUNTER — Ambulatory Visit: Payer: Self-pay

## 2023-06-25 ENCOUNTER — Encounter
Admission: RE | Disposition: A | Discharge status: Home / Self Care | Payer: Self-pay | Source: Home / Self Care | Attending: Internal Medicine

## 2023-06-25 ENCOUNTER — Encounter: Payer: Self-pay | Admitting: Internal Medicine

## 2023-06-25 ENCOUNTER — Ambulatory Visit
Admission: RE | Admit: 2023-06-25 | Discharge: 2023-06-25 | Disposition: A | Discharge status: Home / Self Care | Payer: Medicare Other | Attending: Internal Medicine | Admitting: Internal Medicine

## 2023-06-25 ENCOUNTER — Other Ambulatory Visit: Payer: Self-pay

## 2023-06-25 DIAGNOSIS — R519 Headache, unspecified: Secondary | ICD-10-CM | POA: Diagnosis not present

## 2023-06-25 DIAGNOSIS — E1140 Type 2 diabetes mellitus with diabetic neuropathy, unspecified: Secondary | ICD-10-CM | POA: Insufficient documentation

## 2023-06-25 DIAGNOSIS — F419 Anxiety disorder, unspecified: Secondary | ICD-10-CM | POA: Diagnosis not present

## 2023-06-25 DIAGNOSIS — D123 Benign neoplasm of transverse colon: Secondary | ICD-10-CM | POA: Diagnosis not present

## 2023-06-25 DIAGNOSIS — Z79899 Other long term (current) drug therapy: Secondary | ICD-10-CM | POA: Diagnosis not present

## 2023-06-25 DIAGNOSIS — H8109 Meniere's disease, unspecified ear: Secondary | ICD-10-CM | POA: Diagnosis not present

## 2023-06-25 DIAGNOSIS — I10 Essential (primary) hypertension: Secondary | ICD-10-CM | POA: Insufficient documentation

## 2023-06-25 DIAGNOSIS — N2581 Secondary hyperparathyroidism of renal origin: Secondary | ICD-10-CM | POA: Insufficient documentation

## 2023-06-25 DIAGNOSIS — Z955 Presence of coronary angioplasty implant and graft: Secondary | ICD-10-CM | POA: Insufficient documentation

## 2023-06-25 DIAGNOSIS — Z7902 Long term (current) use of antithrombotics/antiplatelets: Secondary | ICD-10-CM | POA: Insufficient documentation

## 2023-06-25 DIAGNOSIS — Z794 Long term (current) use of insulin: Secondary | ICD-10-CM | POA: Diagnosis not present

## 2023-06-25 DIAGNOSIS — I252 Old myocardial infarction: Secondary | ICD-10-CM | POA: Insufficient documentation

## 2023-06-25 DIAGNOSIS — K219 Gastro-esophageal reflux disease without esophagitis: Secondary | ICD-10-CM | POA: Diagnosis not present

## 2023-06-25 DIAGNOSIS — E7800 Pure hypercholesterolemia, unspecified: Secondary | ICD-10-CM | POA: Diagnosis not present

## 2023-06-25 DIAGNOSIS — I2510 Atherosclerotic heart disease of native coronary artery without angina pectoris: Secondary | ICD-10-CM | POA: Diagnosis not present

## 2023-06-25 DIAGNOSIS — G4730 Sleep apnea, unspecified: Secondary | ICD-10-CM | POA: Insufficient documentation

## 2023-06-25 DIAGNOSIS — Z1211 Encounter for screening for malignant neoplasm of colon: Secondary | ICD-10-CM | POA: Diagnosis present

## 2023-06-25 DIAGNOSIS — Z85528 Personal history of other malignant neoplasm of kidney: Secondary | ICD-10-CM | POA: Diagnosis not present

## 2023-06-25 DIAGNOSIS — Z7984 Long term (current) use of oral hypoglycemic drugs: Secondary | ICD-10-CM | POA: Insufficient documentation

## 2023-06-25 HISTORY — DX: Malignant neoplasm of unspecified kidney, except renal pelvis: C64.9

## 2023-06-25 HISTORY — DX: Secondary hyperparathyroidism of renal origin: N25.81

## 2023-06-25 HISTORY — DX: Proteinuria, unspecified: R80.9

## 2023-06-25 HISTORY — DX: Anemia, unspecified: D64.9

## 2023-06-25 HISTORY — DX: Hyperkalemia: E87.5

## 2023-06-25 HISTORY — PX: COLONOSCOPY WITH PROPOFOL: SHX5780

## 2023-06-25 HISTORY — DX: Hypercalcemia: E83.52

## 2023-06-25 LAB — GLUCOSE, CAPILLARY: Glucose-Capillary: 111 mg/dL — ABNORMAL HIGH (ref 70–99)

## 2023-06-25 SURGERY — COLONOSCOPY WITH PROPOFOL
Anesthesia: General

## 2023-06-25 MED ORDER — PROPOFOL 1000 MG/100ML IV EMUL
INTRAVENOUS | Status: AC
  Filled 2023-06-25: qty 200

## 2023-06-25 MED ORDER — PROPOFOL 500 MG/50ML IV EMUL
INTRAVENOUS | Status: DC | PRN
  Administered 2023-06-25: 60 mg via INTRAVENOUS
  Administered 2023-06-25: 200 ug/kg/min via INTRAVENOUS

## 2023-06-25 MED ORDER — LIDOCAINE HCL (PF) 2 % IJ SOLN
INTRAMUSCULAR | Status: AC
  Filled 2023-06-25: qty 5

## 2023-06-25 MED ORDER — SODIUM CHLORIDE 0.9 % IV SOLN: INTRAVENOUS | Status: DC

## 2023-06-25 NOTE — Transfer of Care (Signed)
 Immediate Anesthesia Transfer of Care Note  Patient: Kenneth Osborn  Procedure(s) Performed: COLONOSCOPY WITH PROPOFOL   Patient Location: Endoscopy Unit  Anesthesia Type:General  Level of Consciousness: sedated  Airway & Oxygen Therapy: Patient Spontanous Breathing  Post-op Assessment: Report given to RN and Post -op Vital signs reviewed and stable  Post vital signs: Reviewed and stable  Last Vitals:  Vitals Value Taken Time  BP 103/70 06/25/23 0914  Temp 35.7 C 06/25/23 0914  Pulse 70 06/25/23 0914  Resp 15 06/25/23 0914  SpO2 93 % 06/25/23 0914    Last Pain:  Vitals:   06/25/23 0914  TempSrc: Temporal  PainSc: Asleep         Complications: There were no known notable events for this encounter.

## 2023-06-25 NOTE — Op Note (Signed)
 [facility] Patient Name: Kenneth Osborn Procedure Date: 06/25/2023 8:10 AM MRN: [id number] Account #: [id number] Date of Birth: March 13, 1955 Admit Type: Outpatient Age: 68 Room: [facility] ENDO ROOM 2 Gender: Male Note Status: Finalized Instrument Name: Hyman Main [id number] Procedure:             Colonoscopy Indications:           High risk colon cancer surveillance: Personal history                         of non-advanced adenoma Providers:             Shahrzad Koble K. Corky Diener MD, MD Referring MD:          Naesha Buckalew K. Corky Diener MD, MD (Referring MD) Medicines:             Propofol  per Anesthesia Complications:         No immediate complications. Estimated blood loss: None. Procedure:             Pre-Anesthesia Assessment:                        - The risks and benefits of the procedure and the                         sedation options and risks were discussed with the                         patient. All questions were answered and informed                         consent was obtained.                        - Patient identification and proposed procedure were                         verified prior to the procedure by the nurse. The                         procedure was verified in the procedure room.                        - ASA Grade Assessment: III - A patient with severe                         systemic disease.                        - After reviewing the risks and benefits, the patient                         was deemed in satisfactory condition to undergo the                         procedure.                        After obtaining informed consent, the colonoscope was  passed under direct vision. Throughout the procedure,                         the patient's blood pressure, pulse, and oxygen                         saturations were monitored continuously. The                         Colonoscope was introduced through the anus and                          advanced to the the cecum, identified by appendiceal                         orifice and ileocecal valve. The colonoscopy was                         performed without difficulty. The patient tolerated                         the procedure well. The quality of the bowel                         preparation was good. The ileocecal valve, appendiceal                         orifice, and rectum were photographed. Findings:      The perianal and digital rectal examinations were normal. Pertinent       negatives include normal sphincter tone and no palpable rectal lesions.      A 12 mm polyp was found in the transverse colon. The polyp was       semi-pedunculated. The polyp was removed with a hot snare. Resection and       retrieval were complete.      The exam was otherwise without abnormality on direct and retroflexion       views. Impression:            - One 12 mm polyp in the transverse colon, removed                         with a hot snare. Resected and retrieved.                        - The examination was otherwise normal on direct and                         retroflexion views. Recommendation:        - Patient has a contact number available for                         emergencies. The signs and symptoms of potential                         delayed complications were discussed with the patient.                         Return to normal activities tomorrow. Written  discharge instructions were provided to the patient.                        - Resume previous diet.                        - Continue present medications.                        - Resume antiplatelet medication at prior dose today.                         Refer to managing physician for further adjustment of                         therapy.                        - Repeat colonoscopy in 3 - 5 years for surveillance                         based on pathology results.                         - Return to GI office PRN.                        - The findings and recommendations were discussed with                         the patient. Procedure Code(s):     --- Professional ---                        [phone], Colonoscopy, flexible; with removal of                         tumor(s), polyp(s), or other lesion(s) by snare                         technique Diagnosis Code(s):     --- Professional ---                        D12.3, Benign neoplasm of transverse colon (hepatic                         flexure or splenic flexure)                        Z86.010, Personal history of colonic polyps CPT copyright 2022 [employer]. All rights reserved. The codes documented in this report are preliminary and upon coder review may  be revised to meet current compliance requirements. Cassie Click MD, MD 06/25/2023 9:24:19 AM This report has been signed electronically. Number of Addenda: 0 Note Initiated On: 06/25/2023 8:10 AM Scope Withdrawal Time: 0 hours 8 minutes 16 seconds  Total Procedure Duration: 0 hours 11 minutes 58 seconds  Estimated Blood Loss:  Estimated blood loss: none.      [facility]

## 2023-06-25 NOTE — Anesthesia Preprocedure Evaluation (Signed)
 Anesthesia Evaluation  Patient identified by MRN, date of birth, ID band Patient awake    Reviewed: Allergy & Precautions, NPO status , Patient's Chart, lab work & pertinent test results  History of Anesthesia Complications Negative for: history of anesthetic complications  Airway Mallampati: III  TM Distance: >3 FB Neck ROM: full    Dental no notable dental hx.    Pulmonary sleep apnea and Continuous Positive Airway Pressure Ventilation    Pulmonary exam normal        Cardiovascular hypertension, (-) angina + CAD, + Past MI and + Cardiac Stents  (-) DOE + dysrhythmias Supra Ventricular Tachycardia      Neuro/Psych  Headaches PSYCHIATRIC DISORDERS Anxiety      Neuromuscular disease    GI/Hepatic Neg liver ROS,GERD  Medicated,,  Endo/Other  negative endocrine ROSdiabetes, Well Controlled, Type 2, Insulin  Dependent, Oral Hypoglycemic Agents    Renal/GU Renal disease  negative genitourinary   Musculoskeletal   Abdominal   Peds  Hematology negative hematology ROS (+)   Anesthesia Other Findings Past Medical History: 12/03/2018: Acute postoperative pain 12/30/2012: Allergic rhinitis No date: Anemia No date: Anginal pain (HCC) No date: Anxiety No date: Bronchitis     Comment:  hx of 08/12/2014: Can't get food down 01/2020: Cancer [facility])     Comment:  kidney  No date: Chronic back pain     Comment:  thoracic area 09/2015: Concussion No date: Coronary artery disease     Comment:  99% blockage No date: DDD (degenerative disc disease), cervical No date: Dehydration symptoms     Comment:  2019 No date: Diabetes mellitus without complication (HCC)     Comment:  insulin  dependent No date: Dysphagia No date: GERD (gastroesophageal reflux disease) 12/21/2014: History of Meniere's disease 12/21/2014: History of thoracic spine surgery (S/P T9-10 IVD spacer) No date: Hypercalcemia No date: Hypercholesteremia No date:  Hyperkalemia No date: Hyperlipidemia No date: Hypertension     Comment:  sees Dr. Autry Legions walker [facility] No date: Meniere's disease     Comment:  deaf in right ear, takes diazepam  2012: Myocardial infarction [facility])     Comment:  Sees Dr. Elin Guan, [facility] No date: Neuromuscular disorder [facility])     Comment:  diabetic neuropathy in feet No date: Proteinuria No date: Renal cell carcinoma (HCC) No date: Renal disorder No date: Secondary hyperparathyroidism of renal origin (HCC) 02/07/2020: Severe sepsis (HCC) No date: Short-segment Barrett's esophagus No date: Sleep apnea     Comment:  mild  Past Surgical History: No date: APPENDECTOMY     Comment:  age 3 01/04/2013: ARTHRODESIS ANTERIOR ANTERIOR CERVICLE SPINE No date: BACK SURGERY     Comment:  fusion thoracic area No date: CARDIAC CATHETERIZATION     Comment:  may 2012 and Nov 20, 2010 06/15/2015: CARDIAC CATHETERIZATION; N/A     Comment:  Procedure: Left Heart Cath and Coronary Angiography;                Surgeon: Michelle Aid, MD;  Location: [facility] INVASIVE               CV LAB;  Service: Cardiovascular;  Laterality: N/A; 06/15/2015: CARDIAC CATHETERIZATION; N/A     Comment:  Procedure: Coronary Stent Intervention;  Surgeon:               Percival Brace, MD;  Location: [facility] INVASIVE CV LAB;              Service: Cardiovascular;  Laterality: N/A; No date: CARDIOVASCULAR  STRESS TEST     Comment:  jan 2014 09/19/2014: COLONOSCOPY WITH PROPOFOL ; N/A     Comment:  Procedure: COLONOSCOPY WITH PROPOFOL ;  Surgeon: Cassie Click, MD;  Location: [facility] ENDOSCOPY;  Service:               Endoscopy;  Laterality: N/A; 04/03/2021: COLONOSCOPY WITH PROPOFOL ; N/A     Comment:  Procedure: COLONOSCOPY WITH PROPOFOL ;  Surgeon:               Shane Darling, MD;  Location: [facility] ENDOSCOPY;                Service: Endoscopy;  Laterality: N/A;  IDDM No date: CORONARY ANGIOPLASTY     Comment:  stent placement  x3 09/19/2014: ESOPHAGOGASTRODUODENOSCOPY; N/A     Comment:  Procedure: ESOPHAGOGASTRODUODENOSCOPY (EGD);  Surgeon:               Cassie Click, MD;  Location: [facility] ENDOSCOPY;                Service: Endoscopy;  Laterality: N/A; 02/09/2020: IR PERC CHOLECYSTOSTOMY 03/14/2020: IR RADIOLOGIST EVAL & MGMT No date: LABRINTHECTOMY     Comment:  1999 right ear 08/09/2019: LUMBAR SPINAL CORD SIMULATOR LEAD REMOVAL; Right     Comment:  Procedure: REMOVAL SPINAL CORD STIMULATOR PERCUTANEOUS               LEADS, REMOVAL PULSE GENERATOR;  Surgeon: Berta Brittle,               MD;  Location: [facility] ORS;  Service: Neurosurgery;                Laterality: Right;  LOCAL WITH MAC No date: mastoid shunt; Bilateral     Comment:  left, 2002, 1997 right ear, 1980 right ear No date: NEPHRECTOMY; Left 01/18/2019: PULSE GENERATOR IMPLANT; N/A     Comment:  Procedure: MEDTRONIC SPINAL CORD STIMULATOR BATTERY               EXCHANGE;  Surgeon: Berta Brittle, MD;  Location: [facility]               ORS;  Service: Neurosurgery;  Laterality: N/A; 04/20/2020: ROBOT ASSISTED LAPAROSCOPIC NEPHRECTOMY; Left     Comment:  Procedure: XI ROBOTIC ASSITED LAPAROSCOPIC NEPHRECTOMY;               Surgeon: Florencio Hunting, MD;  Location: [facility] ORS;  Service:              Urology;  Laterality: Left; 09/19/2014: SAVORY DILATION; N/A     Comment:  Procedure: SAVORY DILATION;  Surgeon: Cassie Click,               MD;  Location: [facility] ENDOSCOPY;  Service: Endoscopy;                Laterality: N/A; 04/06/2012: SHOULDER ARTHROSCOPY WITH SUBACROMIAL DECOMPRESSION; Left     Comment:  Procedure: SHOULDER ARTHROSCOPY WITH SUBACROMIAL               DECOMPRESSION;  Surgeon: Christie Cox, MD;  Location: [facility]               OR;  Service: Orthopedics;  Laterality: Left;  left               shoulder arthroscopy, subacromial decompression and  distal clavicle resection No date: SPINAL CORD STIMULATOR IMPLANT; Right      Reproductive/Obstetrics negative OB ROS                             Anesthesia Physical Anesthesia Plan  ASA: 3  Anesthesia Plan: General   Post-op Pain Management: Minimal or no pain anticipated   Induction: Intravenous  PONV Risk Score and Plan: 1 and Propofol  infusion and TIVA  Airway Management Planned: Natural Airway and Nasal Cannula  Additional Equipment:   Intra-op Plan:   Post-operative Plan:   Informed Consent: I have reviewed the patients History and Physical, chart, labs and discussed the procedure including the risks, benefits and alternatives for the proposed anesthesia with the patient or authorized representative who has indicated his/her understanding and acceptance.     Dental Advisory Given  Plan Discussed with: Anesthesiologist, CRNA and Surgeon  Anesthesia Plan Comments: (Patient consented for risks of anesthesia including but not limited to:  - adverse reactions to medications - risk of airway placement if required - damage to eyes, teeth, lips or other oral mucosa - nerve damage due to positioning  - sore throat or hoarseness - Damage to heart, brain, nerves, lungs, other parts of body or loss of life  Patient voiced understanding and assent.)       Anesthesia Quick Evaluation

## 2023-06-25 NOTE — H&P (Signed)
 Outpatient short stay form Pre-procedure 06/25/2023 8:23 AM Winnie Barsky K. Corky Diener, M.D.  Primary Physician: Lyle San, M.D.  Reason for visit:  Personal history of adenomatous colon polyps  History of present illness:  Mr. Mirchandani presents to the [facility] GI clinic for follow-up to discuss scheduling repeat surveillance colonoscopy and opioid-induced constipation. He had colonoscopy performed by Dr. Emerick Hanlon in Feb 2023 which showed suboptimal prep and 3 subcentimeter adenomas removed. A 1-2 year repeat was advised. He reports he did the colon prep as advised. He did try the Movantik for 22-month but stopped because it was too expensive. He continues to have chronic constipation suspected 2/2 daily use of opioids. He takes a stool softener couples times a week. He is a little concerned about change in bowel habits recently because he has noticed issues with some fecal urgency and not being able to make the bathroom in time and having accidents. Stools can be very loose and watery. Currently he estimates he is having a BM every 2 days. He denies any issues with hematochezia or melena. No complaints of abdominal pain or abdominal cramping. His appetite and diet are stable without any unintentional weight loss. He reports GERD symptoms are well-controlled with use of esomeprazole 40 mg twice daily. He denies any frequent breakthrough or refractory GERD symptoms. He denies any complaints of nocturnal awakenings, esophageal dysphagia, odynophagia, early satiety, hoarseness, or epigastric abdominal pain.     Current Facility-Administered Medications:    0.9 %  sodium chloride  infusion, , Intravenous, Continuous, Micheal Murad K, MD  Medications Prior to Admission  Medication Sig Dispense Refill Last Dose/Taking   cetirizine (ZYRTEC) 10 MG tablet Take 10 mg by mouth daily.    06/25/2023 at  5:00 AM   chlorthalidone (HYGROTON) 25 MG tablet Take 25 mg by mouth daily.   06/24/2023   cyclobenzaprine  (FLEXERIL ) 10  MG tablet Take 1 tablet (10 mg total) by mouth 3 (three) times daily as needed for muscle spasms. 30 tablet 0 06/25/2023 at  5:00 AM   diazepam  (VALIUM ) 5 MG tablet Take 5 mg by mouth 3 (three) times daily as needed.   06/25/2023 at  5:00 AM   diltiazem  (CARDIZEM  CD) 240 MG 24 hr capsule Take 240 mg by mouth daily.   06/24/2023   esomeprazole (NEXIUM) 40 MG capsule Take 40 mg by mouth 2 (two) times daily.   06/24/2023   ezetimibe  (ZETIA ) 10 MG tablet Take 10 mg by mouth daily.   06/24/2023   gabapentin  (NEURONTIN ) 800 MG tablet Take 800 mg by mouth 2 (two) times daily.   06/24/2023   Glucagon (BAQSIMI ONE PACK NA) Place 1 Dose into the nose daily as needed (severely low blood sugar).   06/24/2023   HUMULIN  R U-500 KWIKPEN 500 UNIT/ML KwikPen Inject 65-75 Units into the skin See admin instructions. Sliding scale Can adjust according to blood sugar Home med   06/24/2023   isosorbide  mononitrate (IMDUR ) 30 MG 24 hr tablet Take 30 mg by mouth daily.   06/24/2023   JARDIANCE 25 MG TABS tablet Take 25 mg by mouth daily.   06/24/2023   latanoprost (XALATAN) 0.005 % ophthalmic solution Place 1 drop into both eyes at bedtime.   06/24/2023   Magnesium  Oxide 250 MG TABS Take 500 mg by mouth daily.   06/24/2023   meclizine  (ANTIVERT ) 25 MG tablet Take 25 mg by mouth See admin instructions. Take 25 mg in the morning, and additional 25 mg if needed up to a total of  three per day for Meniere's disease   06/24/2023   metFORMIN  (GLUCOPHAGE ) 1000 MG tablet Take 1,000 mg by mouth 2 (two) times daily.   06/24/2023   metoprolol  succinate (TOPROL -XL) 100 MG 24 hr tablet Take 100 mg by mouth 2 (two) times daily. Take with or immediately following a meal.   06/25/2023 at  5:00 AM   montelukast  (SINGULAIR ) 10 MG tablet Take 10 mg by mouth at bedtime.    06/24/2023   naloxone  (NARCAN ) nasal spray 4 mg/0.1 mL Place 1 spray into the nose as needed for up to 365 doses (for opioid-induced respiratory depresssion). In case of emergency (overdose), spray once  into each nostril. If no response within 3 minutes, repeat application and call 911. 1 each 0 06/24/2023   niacin  (NIASPAN ) 500 MG CR tablet Take 500 mg by mouth at bedtime.   06/24/2023   nitroGLYCERIN  (NITROSTAT ) 0.4 MG SL tablet Place 0.4 mg under the tongue as needed.   06/24/2023   [START ON 07/18/2023] oxyCODONE  (OXY IR/ROXICODONE ) 5 MG immediate release tablet Take 1 tablet (5 mg total) by mouth every 8 (eight) hours. Must last 30 days. 90 tablet 0 06/24/2023   [START ON 08/17/2023] oxyCODONE  (OXY IR/ROXICODONE ) 5 MG immediate release tablet Take 1 tablet (5 mg total) by mouth every 8 (eight) hours. Must last 30 days. 90 tablet 0 06/24/2023   [START ON 09/16/2023] oxyCODONE  (OXY IR/ROXICODONE ) 5 MG immediate release tablet Take 1 tablet (5 mg total) by mouth every 8 (eight) hours. Must last 30 days. 90 tablet 0 06/24/2023   prasugrel  (EFFIENT ) 10 MG TABS tablet Take 10 mg by mouth daily.   06/19/2023   rosuvastatin  (CRESTOR ) 40 MG tablet Take 40 mg by mouth at bedtime.   06/24/2023   senna-docusate (SENOKOT-S) 8.6-50 MG tablet Take 1 tablet by mouth 2 (two) times daily. 30 tablet 0 06/24/2023   ALPRAZolam  (XANAX ) 0.5 MG tablet Take 1 tablet (0.5 mg total) by mouth at bedtime. (Patient taking differently: Take 0.5 mg by mouth at bedtime. PRN) 30 tablet 0      No Known Allergies   Past Medical History:  Diagnosis Date   Acute postoperative pain 12/03/2018   Allergic rhinitis 12/30/2012   Anemia    Anginal pain (HCC)    Anxiety    Bronchitis    hx of   Can't get food down 08/12/2014   Cancer (HCC) 01/2020   kidney    Chronic back pain    thoracic area   Concussion 09/2015   Coronary artery disease    99% blockage   DDD (degenerative disc disease), cervical    Dehydration symptoms    2019   Diabetes mellitus without complication (HCC)    insulin  dependent   Dysphagia    GERD (gastroesophageal reflux disease)    History of Meniere's disease 12/21/2014   History of thoracic spine surgery (S/P  T9-10 IVD spacer) 12/21/2014   Hypercalcemia    Hypercholesteremia    Hyperkalemia    Hyperlipidemia    Hypertension    sees Dr. Autry Legions walker [facility]   Meniere's disease    deaf in right ear, takes diazepam    Myocardial infarction [facility]) 2012   Sees Dr. Elin Guan, [facility]   Neuromuscular disorder [facility])    diabetic neuropathy in feet   Proteinuria    Renal cell carcinoma [facility])    Renal disorder    Secondary hyperparathyroidism of renal origin (HCC)    Severe sepsis (HCC) 02/07/2020   Short-segment Barrett's  esophagus    Sleep apnea    mild    Review of systems:  Otherwise negative.    Physical Exam  Gen: Alert, oriented. Appears stated age.  HEENT: [place]/AT. PERRLA. Lungs: CTA, no wheezes. CV: RR nl S1, S2. Abd: soft, benign, no masses. BS+ Ext: No edema. Pulses 2+    Planned procedures: Proceed with colonoscopy. The patient understands the nature of the planned procedure, indications, risks, alternatives and potential complications including but not limited to bleeding, infection, perforation, damage to internal organs and possible oversedation/side effects from anesthesia. The patient agrees and gives consent to proceed.  Please refer to procedure notes for findings, recommendations and patient disposition/instructions.     Fleurette Woolbright K. Corky Diener, M.D. Gastroenterology 06/25/2023  8:23 AM

## 2023-06-25 NOTE — Interval H&P Note (Signed)
 History and Physical Interval Note:  06/25/2023 8:25 AM  Kenneth Osborn  has presented today for surgery, with the diagnosis of h/o TA polyps.  The various methods of treatment have been discussed with the patient and family. After consideration of risks, benefits and other options for treatment, the patient has consented to  Procedure(s) with comments: COLONOSCOPY WITH PROPOFOL  (N/A) - IDDM, 1ST CASE as a surgical intervention.  The patient's history has been reviewed, patient examined, no change in status, stable for surgery.  I have reviewed the patient's chart and labs.  Questions were answered to the patient's satisfaction.     [place], [name]

## 2023-06-25 NOTE — Anesthesia Postprocedure Evaluation (Signed)
 Anesthesia Post Note  Patient: Kenneth Osborn  Procedure(s) Performed: COLONOSCOPY WITH PROPOFOL   Patient location during evaluation: Endoscopy Anesthesia Type: General Level of consciousness: awake and alert Pain management: pain level controlled Vital Signs Assessment: post-procedure vital signs reviewed and stable Respiratory status: spontaneous breathing, nonlabored ventilation, respiratory function stable and patient connected to nasal cannula oxygen Cardiovascular status: blood pressure returned to baseline and stable Postop Assessment: no apparent nausea or vomiting Anesthetic complications: no   There were no known notable events for this encounter.   Last Vitals:  Vitals:   06/25/23 0924 06/25/23 0934  BP: 100/75 126/84  Pulse: 62 66  Resp: 15 13  Temp:    SpO2: 98% 96%    Last Pain:  Vitals:   06/25/23 0924  TempSrc:   PainSc: 0-No pain                 Nancey Awkward

## 2023-06-26 LAB — SURGICAL PATHOLOGY

## 2023-08-26 ENCOUNTER — Emergency Department

## 2023-08-26 ENCOUNTER — Inpatient Hospital Stay
Admission: EM | Admit: 2023-08-26 | Discharge: 2023-09-03 | DRG: 659 | Disposition: A | Discharge status: Home Health | Attending: Internal Medicine | Admitting: Internal Medicine

## 2023-08-26 ENCOUNTER — Other Ambulatory Visit: Payer: Self-pay

## 2023-08-26 DIAGNOSIS — M549 Dorsalgia, unspecified: Secondary | ICD-10-CM | POA: Diagnosis present

## 2023-08-26 DIAGNOSIS — N1832 Chronic kidney disease, stage 3b: Secondary | ICD-10-CM | POA: Diagnosis not present

## 2023-08-26 DIAGNOSIS — Z6832 Body mass index (BMI) 32.0-32.9, adult: Secondary | ICD-10-CM

## 2023-08-26 DIAGNOSIS — Z9682 Presence of neurostimulator: Secondary | ICD-10-CM

## 2023-08-26 DIAGNOSIS — R310 Gross hematuria: Secondary | ICD-10-CM | POA: Diagnosis not present

## 2023-08-26 DIAGNOSIS — Z79899 Other long term (current) drug therapy: Secondary | ICD-10-CM

## 2023-08-26 DIAGNOSIS — Z8249 Family history of ischemic heart disease and other diseases of the circulatory system: Secondary | ICD-10-CM

## 2023-08-26 DIAGNOSIS — J9601 Acute respiratory failure with hypoxia: Secondary | ICD-10-CM | POA: Diagnosis not present

## 2023-08-26 DIAGNOSIS — H8109 Meniere's disease, unspecified ear: Secondary | ICD-10-CM | POA: Diagnosis present

## 2023-08-26 DIAGNOSIS — R001 Bradycardia, unspecified: Secondary | ICD-10-CM | POA: Diagnosis not present

## 2023-08-26 DIAGNOSIS — R000 Tachycardia, unspecified: Secondary | ICD-10-CM | POA: Diagnosis not present

## 2023-08-26 DIAGNOSIS — E872 Acidosis, unspecified: Secondary | ICD-10-CM | POA: Diagnosis present

## 2023-08-26 DIAGNOSIS — E7800 Pure hypercholesterolemia, unspecified: Secondary | ICD-10-CM | POA: Diagnosis present

## 2023-08-26 DIAGNOSIS — Z833 Family history of diabetes mellitus: Secondary | ICD-10-CM

## 2023-08-26 DIAGNOSIS — E1122 Type 2 diabetes mellitus with diabetic chronic kidney disease: Secondary | ICD-10-CM | POA: Diagnosis present

## 2023-08-26 DIAGNOSIS — N5082 Scrotal pain: Secondary | ICD-10-CM | POA: Diagnosis present

## 2023-08-26 DIAGNOSIS — F419 Anxiety disorder, unspecified: Secondary | ICD-10-CM | POA: Diagnosis present

## 2023-08-26 DIAGNOSIS — R319 Hematuria, unspecified: Secondary | ICD-10-CM | POA: Diagnosis not present

## 2023-08-26 DIAGNOSIS — N1831 Chronic kidney disease, stage 3a: Secondary | ICD-10-CM

## 2023-08-26 DIAGNOSIS — I10 Essential (primary) hypertension: Secondary | ICD-10-CM | POA: Diagnosis not present

## 2023-08-26 DIAGNOSIS — E785 Hyperlipidemia, unspecified: Secondary | ICD-10-CM | POA: Diagnosis not present

## 2023-08-26 DIAGNOSIS — Z85528 Personal history of other malignant neoplasm of kidney: Secondary | ICD-10-CM

## 2023-08-26 DIAGNOSIS — Z905 Acquired absence of kidney: Secondary | ICD-10-CM

## 2023-08-26 DIAGNOSIS — G928 Other toxic encephalopathy: Secondary | ICD-10-CM | POA: Diagnosis present

## 2023-08-26 DIAGNOSIS — N3289 Other specified disorders of bladder: Secondary | ICD-10-CM | POA: Diagnosis present

## 2023-08-26 DIAGNOSIS — Z794 Long term (current) use of insulin: Secondary | ICD-10-CM

## 2023-08-26 DIAGNOSIS — K567 Ileus, unspecified: Secondary | ICD-10-CM | POA: Diagnosis present

## 2023-08-26 DIAGNOSIS — Z7984 Long term (current) use of oral hypoglycemic drugs: Secondary | ICD-10-CM

## 2023-08-26 DIAGNOSIS — K2270 Barrett's esophagus without dysplasia: Secondary | ICD-10-CM | POA: Diagnosis present

## 2023-08-26 DIAGNOSIS — N1339 Other hydronephrosis: Secondary | ICD-10-CM | POA: Diagnosis not present

## 2023-08-26 DIAGNOSIS — N400 Enlarged prostate without lower urinary tract symptoms: Secondary | ICD-10-CM | POA: Diagnosis present

## 2023-08-26 DIAGNOSIS — I2510 Atherosclerotic heart disease of native coronary artery without angina pectoris: Secondary | ICD-10-CM | POA: Diagnosis present

## 2023-08-26 DIAGNOSIS — E1140 Type 2 diabetes mellitus with diabetic neuropathy, unspecified: Secondary | ICD-10-CM | POA: Diagnosis present

## 2023-08-26 DIAGNOSIS — I129 Hypertensive chronic kidney disease with stage 1 through stage 4 chronic kidney disease, or unspecified chronic kidney disease: Secondary | ICD-10-CM | POA: Diagnosis present

## 2023-08-26 DIAGNOSIS — Z7902 Long term (current) use of antithrombotics/antiplatelets: Secondary | ICD-10-CM

## 2023-08-26 DIAGNOSIS — G4733 Obstructive sleep apnea (adult) (pediatric): Secondary | ICD-10-CM | POA: Diagnosis present

## 2023-08-26 DIAGNOSIS — H9191 Unspecified hearing loss, right ear: Secondary | ICD-10-CM | POA: Diagnosis present

## 2023-08-26 DIAGNOSIS — D62 Acute posthemorrhagic anemia: Secondary | ICD-10-CM | POA: Diagnosis not present

## 2023-08-26 DIAGNOSIS — E875 Hyperkalemia: Secondary | ICD-10-CM | POA: Diagnosis present

## 2023-08-26 DIAGNOSIS — D72828 Other elevated white blood cell count: Secondary | ICD-10-CM | POA: Diagnosis present

## 2023-08-26 DIAGNOSIS — E66811 Obesity, class 1: Secondary | ICD-10-CM | POA: Diagnosis present

## 2023-08-26 DIAGNOSIS — K219 Gastro-esophageal reflux disease without esophagitis: Secondary | ICD-10-CM | POA: Diagnosis present

## 2023-08-26 DIAGNOSIS — N2889 Other specified disorders of kidney and ureter: Secondary | ICD-10-CM

## 2023-08-26 DIAGNOSIS — N2581 Secondary hyperparathyroidism of renal origin: Secondary | ICD-10-CM | POA: Diagnosis present

## 2023-08-26 DIAGNOSIS — Z955 Presence of coronary angioplasty implant and graft: Secondary | ICD-10-CM

## 2023-08-26 DIAGNOSIS — E871 Hypo-osmolality and hyponatremia: Secondary | ICD-10-CM | POA: Diagnosis present

## 2023-08-26 DIAGNOSIS — I252 Old myocardial infarction: Secondary | ICD-10-CM

## 2023-08-26 DIAGNOSIS — N179 Acute kidney failure, unspecified: Secondary | ICD-10-CM | POA: Diagnosis present

## 2023-08-26 DIAGNOSIS — I447 Left bundle-branch block, unspecified: Secondary | ICD-10-CM | POA: Diagnosis present

## 2023-08-26 DIAGNOSIS — G894 Chronic pain syndrome: Secondary | ICD-10-CM | POA: Diagnosis present

## 2023-08-26 DIAGNOSIS — E663 Overweight: Secondary | ICD-10-CM | POA: Diagnosis present

## 2023-08-26 LAB — CBC WITH DIFFERENTIAL/PLATELET
Abs Immature Granulocytes: 0.03 K/uL (ref 0.00–0.07)
Basophils Absolute: 0.1 K/uL (ref 0.0–0.1)
Basophils Relative: 1 %
Eosinophils Absolute: 0.1 K/uL (ref 0.0–0.5)
Eosinophils Relative: 1 %
HCT: 45 % (ref 39.0–52.0)
Hemoglobin: 14.2 g/dL (ref 13.0–17.0)
Immature Granulocytes: 0 %
Lymphocytes Relative: 26 %
Lymphs Abs: 2 K/uL (ref 0.7–4.0)
MCH: 26.2 pg (ref 26.0–34.0)
MCHC: 31.6 g/dL (ref 30.0–36.0)
MCV: 83.2 fL (ref 80.0–100.0)
Monocytes Absolute: 0.5 K/uL (ref 0.1–1.0)
Monocytes Relative: 6 %
Neutro Abs: 5.1 K/uL (ref 1.7–7.7)
Neutrophils Relative %: 66 %
Platelets: 237 K/uL (ref 150–400)
RBC: 5.41 MIL/uL (ref 4.22–5.81)
RDW: 15.5 % (ref 11.5–15.5)
WBC: 7.7 K/uL (ref 4.0–10.5)
nRBC: 0 % (ref 0.0–0.2)

## 2023-08-26 LAB — APTT: aPTT: 28 s (ref 24–36)

## 2023-08-26 LAB — COMPREHENSIVE METABOLIC PANEL WITH GFR
ALT: 55 U/L — ABNORMAL HIGH (ref 0–44)
AST: 50 U/L — ABNORMAL HIGH (ref 15–41)
Albumin: 3.7 g/dL (ref 3.5–5.0)
Alkaline Phosphatase: 92 U/L (ref 38–126)
Anion gap: 10 (ref 5–15)
BUN: 28 mg/dL — ABNORMAL HIGH (ref 8–23)
CO2: 24 mmol/L (ref 22–32)
Calcium: 9.5 mg/dL (ref 8.9–10.3)
Chloride: 104 mmol/L (ref 98–111)
Creatinine, Ser: 1.79 mg/dL — ABNORMAL HIGH (ref 0.61–1.24)
GFR, Estimated: 41 mL/min — ABNORMAL LOW (ref >60)
Glucose, Bld: 281 mg/dL — ABNORMAL HIGH (ref 70–99)
Potassium: 4.7 mmol/L (ref 3.5–5.1)
Sodium: 138 mmol/L (ref 135–145)
Total Bilirubin: 0.6 mg/dL (ref 0.0–1.2)
Total Protein: 7.4 g/dL (ref 6.5–8.1)

## 2023-08-26 LAB — URINALYSIS, ROUTINE W REFLEX MICROSCOPIC
Bacteria, UA: NONE SEEN
RBC / HPF: >50 RBC/hpf (ref 0–5)
Squamous Epithelial / HPF: 0 /HPF (ref 0–5)

## 2023-08-26 LAB — PROTIME-INR
INR: 1 (ref 0.8–1.2)
Prothrombin Time: 13.5 s (ref 11.4–15.2)

## 2023-08-26 MED ORDER — SODIUM CHLORIDE 0.9 % IR SOLN
3000.0000 mL | Status: DC
  Administered 2023-08-27 (×5): 3000 mL

## 2023-08-26 MED ORDER — ACETAMINOPHEN 325 MG PO TABS: 325.0000 mg | ORAL_TABLET | Freq: Four times a day (QID) | ORAL | Status: DC | PRN

## 2023-08-26 MED ORDER — MORPHINE SULFATE (PF) 4 MG/ML IV SOLN: 4.0000 mg | Freq: Once | INTRAVENOUS | Status: DC

## 2023-08-26 MED ORDER — ONDANSETRON HCL 4 MG/2ML IJ SOLN
4.0000 mg | Freq: Three times a day (TID) | INTRAMUSCULAR | Status: DC | PRN
  Administered 2023-08-27 (×2): 4 mg via INTRAVENOUS
  Filled 2023-08-26 (×2): qty 2

## 2023-08-26 MED ORDER — MORPHINE SULFATE (PF) 4 MG/ML IV SOLN
8.0000 mg | Freq: Once | INTRAVENOUS | Status: AC
  Administered 2023-08-26: 8 mg via INTRAVENOUS
  Filled 2023-08-26: qty 2

## 2023-08-26 MED ORDER — MORPHINE SULFATE (PF) 4 MG/ML IV SOLN
4.0000 mg | Freq: Once | INTRAVENOUS | Status: AC
  Administered 2023-08-26: 4 mg via INTRAVENOUS
  Filled 2023-08-26: qty 1

## 2023-08-26 MED ORDER — IOHEXOL 300 MG/ML  SOLN
75.0000 mL | Freq: Once | INTRAMUSCULAR | Status: AC | PRN
  Administered 2023-08-26: 75 mL via INTRAVENOUS

## 2023-08-26 MED ORDER — HYDRALAZINE HCL 20 MG/ML IJ SOLN
5.0000 mg | INTRAMUSCULAR | Status: DC | PRN
  Administered 2023-08-27: 5 mg via INTRAVENOUS
  Filled 2023-08-26: qty 1

## 2023-08-26 MED ORDER — INSULIN ASPART 100 UNIT/ML IJ SOLN
0.0000 [IU] | Freq: Every day | INTRAMUSCULAR | Status: DC
  Administered 2023-08-27 (×2): 2 [IU] via SUBCUTANEOUS
  Filled 2023-08-26 (×2): qty 1

## 2023-08-26 MED ORDER — LIDOCAINE HCL URETHRAL/MUCOSAL 2 % EX GEL
1.0000 | Freq: Once | CUTANEOUS | Status: AC
  Administered 2023-08-26: 1 via URETHRAL
  Filled 2023-08-26: qty 10

## 2023-08-26 MED ORDER — FENTANYL CITRATE PF 50 MCG/ML IJ SOSY
100.0000 ug | PREFILLED_SYRINGE | Freq: Once | INTRAMUSCULAR | Status: AC
  Administered 2023-08-26: 100 ug via INTRAVENOUS
  Filled 2023-08-26: qty 2

## 2023-08-26 MED ORDER — OXYCODONE HCL 5 MG PO TABS
5.0000 mg | ORAL_TABLET | Freq: Four times a day (QID) | ORAL | Status: DC | PRN
  Filled 2023-08-26: qty 1

## 2023-08-26 MED ORDER — OXYCODONE-ACETAMINOPHEN 5-325 MG PO TABS: 1.0000 | ORAL_TABLET | ORAL | Status: DC | PRN

## 2023-08-26 MED ORDER — MORPHINE SULFATE (PF) 2 MG/ML IV SOLN
2.0000 mg | INTRAVENOUS | Status: DC | PRN
  Administered 2023-08-27 – 2023-08-29 (×5): 2 mg via INTRAVENOUS
  Filled 2023-08-26 (×5): qty 1

## 2023-08-26 MED ORDER — INSULIN ASPART 100 UNIT/ML IJ SOLN
0.0000 [IU] | Freq: Three times a day (TID) | INTRAMUSCULAR | Status: DC
  Administered 2023-08-27 – 2023-08-28 (×3): 5 [IU] via SUBCUTANEOUS
  Administered 2023-08-28: 3 [IU] via SUBCUTANEOUS
  Filled 2023-08-26 (×2): qty 1
  Filled 2023-08-26: qty 3
  Filled 2023-08-26: qty 1

## 2023-08-26 NOTE — ED Provider Notes (Signed)
 [facility] Provider Note    Event Date/Time   First MD Initiated Contact with Patient 08/26/23 1911     (approximate)   History   Hematuria   HPI ARRAN FESSEL is a 68 y.o. male with history of HTN, DM2, HLD presenting today for scrotal pain and hematuria.  Reports initial episode happened around 6 AM this morning and worsened throughout the day.  Reports prior history of nephrectomy in March 2022 and on blood thinners.  Patient states he is having groin pain right above his penis.  Seems to have difficulty initiating his stream and blood clots are becoming more prominent.  Has some lower abdominal pain after he pees.  Denies any upper abdominal pain, nausea, vomiting, diarrhea, constipation.  Denies any symptoms similar to this in the past.     Physical Exam   Triage Vital Signs: ED Triage Vitals  Encounter Vitals Group     BP 08/26/23 1634 (!) 153/104     Girls Systolic BP Percentile --      Girls Diastolic BP Percentile --      Boys Systolic BP Percentile --      Boys Diastolic BP Percentile --      Pulse Rate 08/26/23 1634 (!) 107     Resp 08/26/23 1634 18     Temp 08/26/23 1634 98.3 F (36.8 C)     Temp Source 08/26/23 1634 Oral     SpO2 08/26/23 1634 98 %     Weight 08/26/23 1636 202 lb 13.2 oz (92 kg)     Height 08/26/23 1636 5' 9 (1.753 m)     Head Circumference --      Peak Flow --      Pain Score 08/26/23 1635 5     Pain Loc --      Pain Education --      Exclude from Growth Chart --     Most recent vital signs: Vitals:   08/26/23 1634  BP: (!) 153/104  Pulse: (!) 107  Resp: 18  Temp: 98.3 F (36.8 C)  SpO2: 98%   Physical Exam: I have reviewed the vital signs and nursing notes. General: Awake, alert, no acute distress.  Nontoxic appearing. Head:  Atraumatic, normocephalic.   ENT:  EOM intact, PERRL. Oral mucosa is pink and moist with no lesions. Neck: Neck is supple with full range of motion, No meningeal  signs. Cardiovascular:  RRR, No murmurs. Peripheral pulses palpable and equal bilaterally. Respiratory:  Symmetrical chest wall expansion.  No rhonchi, rales, or wheezes.  Good air movement throughout.  No use of accessory muscles.   Musculoskeletal:  No cyanosis or edema. Moving extremities with full ROM Abdomen:  Soft, mild tenderness in the suprapubic region, nondistended. GU: No scrotal tenderness, swelling, or erythema Neuro:  GCS 15, moving all four extremities, interacting appropriately. Speech clear. Psych:  Calm, appropriate.   Skin:  Warm, dry, no rash.    ED Results / Procedures / Treatments   Labs (all labs ordered are listed, but only abnormal results are displayed) Labs Reviewed  COMPREHENSIVE METABOLIC PANEL WITH GFR - Abnormal; Notable for the following components:      Result Value   Glucose, Bld 281 (*)    BUN 28 (*)    Creatinine, Ser 1.79 (*)    AST 50 (*)    ALT 55 (*)    GFR, Estimated 41 (*)    All other components within normal limits  URINALYSIS, ROUTINE  W REFLEX MICROSCOPIC - Abnormal; Notable for the following components:   Color, Urine RED (*)    APPearance TURBID (*)    Glucose, UA   (*)    Value: TEST NOT REPORTED DUE TO COLOR INTERFERENCE OF URINE PIGMENT   Hgb urine dipstick   (*)    Value: TEST NOT REPORTED DUE TO COLOR INTERFERENCE OF URINE PIGMENT   Bilirubin Urine   (*)    Value: TEST NOT REPORTED DUE TO COLOR INTERFERENCE OF URINE PIGMENT   Ketones, ur   (*)    Value: TEST NOT REPORTED DUE TO COLOR INTERFERENCE OF URINE PIGMENT   Protein, ur   (*)    Value: TEST NOT REPORTED DUE TO COLOR INTERFERENCE OF URINE PIGMENT   Nitrite   (*)    Value: TEST NOT REPORTED DUE TO COLOR INTERFERENCE OF URINE PIGMENT   Leukocytes,Ua   (*)    Value: TEST NOT REPORTED DUE TO COLOR INTERFERENCE OF URINE PIGMENT   All other components within normal limits  CBC WITH DIFFERENTIAL/PLATELET  PROTIME-INR  APTT     EKG    RADIOLOGY Independently  interpreted CT abdomen/pelvis with evidence of possible hemorrhage inside the right renal pelvis and blood present in the bladder   PROCEDURES:  Critical Care performed: No  Procedures   MEDICATIONS ORDERED IN ED: Medications  morphine  (PF) 4 MG/ML injection 4 mg (4 mg Intravenous Given 08/26/23 1946)  iohexol  (OMNIPAQUE ) 300 MG/ML solution 75 mL (75 mLs Intravenous Contrast Given 08/26/23 2036)  morphine  (PF) 4 MG/ML injection 8 mg (8 mg Intravenous Given 08/26/23 2156)     IMPRESSION / MDM / ASSESSMENT AND PLAN / ED COURSE  I reviewed the triage vital signs and the nursing notes.                              Differential diagnosis includes, but is not limited to, acute cystitis, intra bladder hemorrhage, intrarenal hemorrhage, nephrolithiasis, urinary retention  Patient's presentation is most consistent with acute presentation with potential threat to life or bodily function.  Patient is a 68 year old male presenting today for onset of hematuria this morning.  He is now mostly only passing blood clots.  Complaining of pain in the suprapubic region radiating up his right side.  Is mildly tachycardic although this is likely from pain symptoms at this time.  Initially given morphine .  CBC with no signs of anemia.  CMP near his baseline creatinine.  INR and PTT normal.  UA is grossly positive for blood without any obvious bacteria.  CT imaging of the abdomen pelvis was ordered and shows evidence of hemorrhage in the right renal pelvis collecting in his bladder.  Suspect this is leading to blood clots and obstruction.  Given he is now having difficult time peeing and having urinary retention with greater than 150 cc in his bladder, will place three-way Foley for irrigation.  Thankfully no drop in his hemoglobin with over 12 hours of bleeding.  Discussed case with urology, Dr. Twylla.  He agrees with Foley for irrigation.  No immediate additional procedures otherwise.  Recommends admission to  hospitalist overnight for ongoing monitoring until urine is clear and bleeding is stopped.  Admitted to hospitalist for further care.  The patient is on the cardiac monitor to evaluate for evidence of arrhythmia and/or significant heart rate changes. Clinical Course as of 08/26/23 2158  Tue Aug 26, 2023  2035 Urinalysis, Routine w  reflex microscopic -Urine, Clean Catch(!) No bacteria present [DW]  2141 Paging urology based on CT findings.  Will place three-way Foley catheter and begin irrigation as well as give additional pain medication [DW]  2146 Spoke with Dr. Twylla.  He agrees with placement of Foley catheter for irrigation of the bladder to help with passage of blood clots.  Nothing particular elsewise to do right now given no drop in his hematocrit/hemoglobin.  Admit overnight for ongoing monitoring of his blood counts and to ensure bleeding eventually stops. [DW]    Clinical Course User Index [DW] Malvina Alm DASEN, MD     FINAL CLINICAL IMPRESSION(S) / ED DIAGNOSES   Final diagnoses:  Gross hematuria  Renal hemorrhage, right     Rx / DC Orders   ED Discharge Orders     None        Note:  This document was prepared using Dragon voice recognition software and may include unintentional dictation errors.   Malvina Alm DASEN, MD 08/26/23 2200

## 2023-08-26 NOTE — H&P (Signed)
 History and Physical    Kenneth Osborn FMW:[id number] DOB: Aug 04, 1955 DOA: 08/26/2023  Referring MD/NP/PA:   PCP: Valora Agent, MD   Patient coming from:  The patient is coming from home.     Chief Complaint: Hematuria  HPI: Kenneth Osborn is a 69 y.o. 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  Patient states that his hematuria started this morning. It has been worsening throughout the day.  He states that he has difficult initiating his urine stream, with blood clot coming out.  He also has pain in his penis, which is constant, sharp, moderate, radiating to the groin area.  He has mild suprapubic abdominal discomfort and nausea.  No vomiting or diarrhea.  No fever or chills.  No burning on urination.  Patient does not have chest pain, cough, SOB.  Data reviewed independently and ED Course: pt was found to have WBC 7.7, stable renal function, UA with WBC 0-5, INR 1.0, PTT 28, hemoglobin 14.2.  Temperature normal, blood pressure 153/104, heart rate of 107, RR 18, oxygen saturation 98% on room air.  Patient is placed in telemetry bed for observation.  Dr. Twylla of urology is consulted by EDP.  CT abdomen/pelvis: 1. Status post left nephrectomy. New mild right hydronephrosis. Hyperdense material in the right renal collecting system, suspect for hemorrhagic material. No definite calcified stone along the course of the ureter. Vague hyperdense masslike area in the posterior bladder, probably represents hemorrhagic material/hematoma, short-term follow-up ultrasound could be obtained to ensure resolution. Poor excretion of contrast from right kidney on delayed views suggesting decreased renal function. 2. Hepatic steatosis. 3. Aortic atherosclerosis.   Aortic Atherosclerosis  (ICD10-I70.0).   EKG:   Not done in ED, will get one.      Review of Systems:   General: no fevers, chills, no body weight gain, has fatigue HEENT: no blurry vision, hearing changes or sore throat Respiratory: no dyspnea, coughing, wheezing CV: no chest pain, no palpitations GI: no nausea, vomiting, abdominal pain, diarrhea, constipation GU: Has hematuria, penis pain, difficulty urinating Ext: no leg edema Neuro: no unilateral weakness, numbness, or tingling, no vision change or hearing loss Skin: no rash, no skin tear. MSK: No muscle spasm, no deformity, no limitation of range of movement in spin Heme: No easy bruising.  Travel history: No recent long distant travel.   Allergy: No Known Allergies  Past Medical History:  Diagnosis Date   Acute postoperative pain 12/03/2018   Allergic rhinitis 12/30/2012   Anemia    Anginal pain (HCC)    Anxiety    Bronchitis    hx of   Can't get food down 08/12/2014   Cancer (HCC) 01/2020   kidney    Chronic back pain    thoracic area   Concussion 09/2015   Coronary artery disease    99% blockage   DDD (degenerative disc disease), cervical    Dehydration symptoms    2019   Diabetes mellitus without complication (HCC)    insulin  dependent   Dysphagia    GERD (gastroesophageal reflux disease)    History of Meniere's disease 12/21/2014   History of thoracic spine surgery (S/P T9-10 IVD spacer) 12/21/2014   Hypercalcemia    Hypercholesteremia    Hyperkalemia    Hyperlipidemia    Hypertension    sees  Dr. Norleen walker Maryl   Meniere's disease    deaf in right ear, takes diazepam    Myocardial infarction [facility]) 2012   Sees Dr. Jettie, [facility] clinic   Neuromuscular disorder [facility])    diabetic neuropathy in feet   Proteinuria    Renal cell carcinoma [facility])    Renal disorder    Secondary hyperparathyroidism of renal origin (HCC)    Severe sepsis (HCC) 02/07/2020   Short-segment Barrett's esophagus    Sleep apnea    mild     Past Surgical History:  Procedure Laterality Date   APPENDECTOMY     age 75   ARTHRODESIS ANTERIOR ANTERIOR CERVICLE SPINE  01/04/2013   BACK SURGERY     fusion thoracic area   CARDIAC CATHETERIZATION     may 2012 and Nov 20, 2010   CARDIAC CATHETERIZATION N/A 06/15/2015   Procedure: Left Heart Cath and Coronary Angiography;  Surgeon: Wolm JINNY Rhyme, MD;  Location: [facility] INVASIVE CV LAB;  Service: Cardiovascular;  Laterality: N/A;   CARDIAC CATHETERIZATION N/A 06/15/2015   Procedure: Coronary Stent Intervention;  Surgeon: Marsa Dooms, MD;  Location: [facility] INVASIVE CV LAB;  Service: Cardiovascular;  Laterality: N/A;   CARDIOVASCULAR STRESS TEST     jan 2014   COLONOSCOPY WITH PROPOFOL  N/A 09/19/2014   Procedure: COLONOSCOPY WITH PROPOFOL ;  Surgeon: Lamar ONEIDA Holmes, MD;  Location: [facility] ENDOSCOPY;  Service: Endoscopy;  Laterality: N/A;   COLONOSCOPY WITH PROPOFOL  N/A 04/03/2021   Procedure: COLONOSCOPY WITH PROPOFOL ;  Surgeon: Maryruth Ole ONEIDA, MD;  Location: [facility] ENDOSCOPY;  Service: Endoscopy;  Laterality: N/A;  IDDM   COLONOSCOPY WITH PROPOFOL  N/A 06/25/2023   Procedure: COLONOSCOPY WITH PROPOFOL ;  Surgeon: Toledo, Ladell POUR, MD;  Location: [facility] ENDOSCOPY;  Service: Gastroenterology;  Laterality: N/A;  IDDM, 1ST CASE   CORONARY ANGIOPLASTY     stent placement x3   ESOPHAGOGASTRODUODENOSCOPY N/A 09/19/2014   Procedure: ESOPHAGOGASTRODUODENOSCOPY (EGD);  Surgeon: Lamar ONEIDA Holmes, MD;  Location: [facility] ENDOSCOPY;  Service: Endoscopy;  Laterality: N/A;   IR PERC CHOLECYSTOSTOMY  02/09/2020   IR RADIOLOGIST EVAL & MGMT  03/14/2020   LABRINTHECTOMY     1999 right ear   LUMBAR SPINAL CORD SIMULATOR LEAD REMOVAL Right 08/09/2019   Procedure: REMOVAL SPINAL CORD STIMULATOR PERCUTANEOUS LEADS, REMOVAL PULSE GENERATOR;  Surgeon: Bluford Standing, MD;  Location: [facility] ORS;  Service: Neurosurgery;  Laterality: Right;  LOCAL WITH MAC   mastoid shunt Bilateral    left, 2002, 1997 right ear,  1980 right ear   NEPHRECTOMY Left    PULSE GENERATOR IMPLANT N/A 01/18/2019   Procedure: MEDTRONIC SPINAL CORD STIMULATOR BATTERY EXCHANGE;  Surgeon: Bluford Standing, MD;  Location: [facility] ORS;  Service: Neurosurgery;  Laterality: N/A;   ROBOT ASSISTED LAPAROSCOPIC NEPHRECTOMY Left 04/20/2020   Procedure: XI ROBOTIC ASSITED LAPAROSCOPIC NEPHRECTOMY;  Surgeon: Renda Glance, MD;  Location: [facility] ORS;  Service: Urology;  Laterality: Left;   SAVORY DILATION N/A 09/19/2014   Procedure: SAVORY DILATION;  Surgeon: Lamar ONEIDA Holmes, MD;  Location: [facility] ENDOSCOPY;  Service: Endoscopy;  Laterality: N/A;   SHOULDER ARTHROSCOPY WITH SUBACROMIAL DECOMPRESSION Left 04/06/2012   Procedure: SHOULDER ARTHROSCOPY WITH SUBACROMIAL DECOMPRESSION;  Surgeon: Marcey Raman, MD;  Location: [facility] OR;  Service: Orthopedics;  Laterality: Left;  left shoulder arthroscopy, subacromial decompression and distal clavicle resection   SPINAL CORD STIMULATOR IMPLANT Right     Social History:  reports that he has never smoked. He has never used smokeless tobacco. He reports that he does not drink alcohol and does  not use drugs.  Family History:  Family History  Problem Relation Age of Onset   Heart disease Mother    Diabetes Mother    Heart disease Father    Cancer Sister    Heart disease Maternal Aunt    Heart disease Maternal Uncle    Diabetes Maternal Grandmother    Diabetes Paternal Grandmother      Prior to Admission medications   Medication Sig Start Date End Date Taking? Authorizing Provider  ALPRAZolam  (XANAX ) 0.5 MG tablet Take 1 tablet (0.5 mg total) by mouth at bedtime. Patient taking differently: Take 0.5 mg by mouth at bedtime. PRN 04/26/20   Lue Elsie BROCKS, MD  cetirizine (ZYRTEC) 10 MG tablet Take 10 mg by mouth daily.     [provider]  chlorthalidone (HYGROTON) 25 MG tablet Take 25 mg by mouth daily. 11/18/22   [provider]  cyclobenzaprine  (FLEXERIL ) 10 MG tablet Take 1 tablet (10 mg  total) by mouth 3 (three) times daily as needed for muscle spasms. 04/26/20   Lue Elsie BROCKS, MD  diazepam  (VALIUM ) 5 MG tablet Take 5 mg by mouth 3 (three) times daily as needed. 06/03/22   [provider]  diltiazem  (CARDIZEM  CD) 240 MG 24 hr capsule Take 240 mg by mouth daily. 03/30/18   [provider]  esomeprazole (NEXIUM) 40 MG capsule Take 40 mg by mouth 2 (two) times daily.    [provider]  ezetimibe  (ZETIA ) 10 MG tablet Take 10 mg by mouth daily. 03/26/17   [provider]  gabapentin  (NEURONTIN ) 800 MG tablet Take 800 mg by mouth 2 (two) times daily.    [provider]  Glucagon (BAQSIMI ONE PACK NA) Place 1 Dose into the nose daily as needed (severely low blood sugar).    [provider]  HUMULIN  R U-500 KWIKPEN 500 UNIT/ML KwikPen Inject 65-75 Units into the skin See admin instructions. Sliding scale Can adjust according to blood sugar Home med 11/02/21   Awanda City, MD  isosorbide  mononitrate (IMDUR ) 30 MG 24 hr tablet Take 30 mg by mouth daily.    [provider]  JARDIANCE 25 MG TABS tablet Take 25 mg by mouth daily. 11/13/22 11/13/23  [provider]  latanoprost  (XALATAN ) 0.005 % ophthalmic solution Place 1 drop into both eyes at bedtime. 11/22/22   [provider]  Magnesium  Oxide 250 MG TABS Take 500 mg by mouth daily.    [provider]  meclizine  (ANTIVERT ) 25 MG tablet Take 25 mg by mouth See admin instructions. Take 25 mg in the morning, and additional 25 mg if needed up to a total of three per day for Meniere's disease 07/06/15   [provider]  metFORMIN  (GLUCOPHAGE ) 1000 MG tablet Take 1,000 mg by mouth 2 (two) times daily. 07/25/20   [provider]  metoprolol  succinate (TOPROL -XL) 100 MG 24 hr tablet Take 100 mg by mouth 2 (two) times daily. Take with or immediately following a meal.    [provider]  montelukast  (SINGULAIR ) 10 MG tablet Take 10 mg by mouth  at bedtime.     [provider]  naloxone  (NARCAN ) nasal spray 4 mg/0.1 mL Place 1 spray into the nose as needed for up to 365 doses (for opioid-induced respiratory depresssion). In case of emergency (overdose), spray once into each nostril. If no response within 3 minutes, repeat application and call 911. 11/25/22 11/25/23  Tanya Glisson, MD  niacin  (NIASPAN ) 500 MG CR tablet Take  500 mg by mouth at bedtime.    [provider]  nitroGLYCERIN  (NITROSTAT ) 0.4 MG SL tablet Place 0.4 mg under the tongue as needed. 08/03/20   [provider]  oxyCODONE  (OXY IR/ROXICODONE ) 5 MG immediate release tablet Take 1 tablet (5 mg total) by mouth every 8 (eight) hours. Must last 30 days. 07/18/23 08/17/23  Patel, Seema K, NP  oxyCODONE  (OXY IR/ROXICODONE ) 5 MG immediate release tablet Take 1 tablet (5 mg total) by mouth every 8 (eight) hours. Must last 30 days. 08/17/23 09/16/23  Patel, Seema K, NP  oxyCODONE  (OXY IR/ROXICODONE ) 5 MG immediate release tablet Take 1 tablet (5 mg total) by mouth every 8 (eight) hours. Must last 30 days. 09/16/23 10/16/23  Patel, Seema K, NP  prasugrel  (EFFIENT ) 10 MG TABS tablet Take 10 mg by mouth daily. 11/22/19   [provider]  rosuvastatin  (CRESTOR ) 40 MG tablet Take 40 mg by mouth at bedtime.    [provider]  senna-docusate (SENOKOT-S) 8.6-50 MG tablet Take 1 tablet by mouth 2 (two) times daily. 04/26/20   Lue Elsie BROCKS, MD    Physical Exam: Vitals:   08/26/23 1634 08/26/23 1636 08/27/23 0015  BP: (!) 153/104  (!) 190/100  Pulse: (!) 107  95  Resp: 18  13  Temp: 98.3 F (36.8 C)  98.2 F (36.8 C)  TempSrc: Oral  Oral  SpO2: 98%  97%  Weight:  92 kg   Height:  5' 9 (1.753 m)    General: Not in acute distress HEENT:       Eyes: PERRL, EOMI, no jaundice       ENT: No discharge from the ears and nose, no pharynx injection, no tonsillar enlargement.        Neck: No JVD, no bruit, no mass felt. Heme: No neck lymph node  enlargement. Cardiac: S1/S2, RRR, No murmurs, No gallops or rubs. Respiratory: No rales, wheezing, rhonchi or rubs. GI: Soft, nondistended, nontender, no rebound pain, no organomegaly, BS present. GU: has hematuria Ext: No pitting leg edema bilaterally. 1+DP/PT pulse bilaterally. Musculoskeletal: No joint deformities, No joint redness or warmth, no limitation of ROM in spin. Skin: No rashes.  Neuro: Alert, oriented X3, cranial nerves II-XII grossly intact, moves all extremities normally. Psych: Patient is not psychotic, no suicidal or hemocidal ideation.  Labs on Admission: I have personally reviewed following labs and imaging studies  CBC: Recent Labs  Lab 08/26/23 1639  WBC 7.7  NEUTROABS 5.1  HGB 14.2  HCT 45.0  MCV 83.2  PLT 237   Basic Metabolic Panel: Recent Labs  Lab 08/26/23 1639  NA 138  K 4.7  CL 104  CO2 24  GLUCOSE 281*  BUN 28*  CREATININE 1.79*  CALCIUM  9.5   GFR: Estimated Creatinine Clearance: 44.2 mL/min (A) (by C-G formula based on SCr of 1.79 mg/dL (H)). Liver Function Tests: Recent Labs  Lab 08/26/23 1639  AST 50*  ALT 55*  ALKPHOS 92  BILITOT 0.6  PROT 7.4  ALBUMIN  3.7   No results for input(s): LIPASE, AMYLASE in the last 168 hours. No results for input(s): AMMONIA in the last 168 hours. Coagulation Profile: Recent Labs  Lab 08/26/23 1639  INR 1.0   Cardiac Enzymes: No results for input(s): CKTOTAL, CKMB, CKMBINDEX, TROPONINI in the last 168 hours. BNP (last 3 results) No results for input(s): PROBNP in the last 8760 hours. HbA1C: No results for input(s): HGBA1C in the last 72 hours. CBG: No results for input(s): GLUCAP  in the last 168 hours. Lipid Profile: No results for input(s): CHOL, HDL, LDLCALC, TRIG, CHOLHDL, LDLDIRECT in the last 72 hours. Thyroid  Function Tests: No results for input(s): TSH, T4TOTAL, FREET4, T3FREE, THYROIDAB in the last 72 hours. Anemia Panel: No results  for input(s): VITAMINB12, FOLATE, FERRITIN, TIBC, IRON, RETICCTPCT in the last 72 hours. Urine analysis:    Component Value Date/Time   COLORURINE RED (A) 08/26/2023 1934   APPEARANCEUR TURBID (A) 08/26/2023 1934   LABSPEC  08/26/2023 1934    TEST NOT REPORTED DUE TO COLOR INTERFERENCE OF URINE PIGMENT   PHURINE  08/26/2023 1934    TEST NOT REPORTED DUE TO COLOR INTERFERENCE OF URINE PIGMENT   GLUCOSEU (A) 08/26/2023 1934    TEST NOT REPORTED DUE TO COLOR INTERFERENCE OF URINE PIGMENT   HGBUR (A) 08/26/2023 1934    TEST NOT REPORTED DUE TO COLOR INTERFERENCE OF URINE PIGMENT   BILIRUBINUR (A) 08/26/2023 1934    TEST NOT REPORTED DUE TO COLOR INTERFERENCE OF URINE PIGMENT   KETONESUR (A) 08/26/2023 1934    TEST NOT REPORTED DUE TO COLOR INTERFERENCE OF URINE PIGMENT   PROTEINUR (A) 08/26/2023 1934    TEST NOT REPORTED DUE TO COLOR INTERFERENCE OF URINE PIGMENT   NITRITE (A) 08/26/2023 1934    TEST NOT REPORTED DUE TO COLOR INTERFERENCE OF URINE PIGMENT   LEUKOCYTESUR (A) 08/26/2023 1934    TEST NOT REPORTED DUE TO COLOR INTERFERENCE OF URINE PIGMENT   Sepsis Labs: @LABRCNTIP (procalcitonin:4,lacticidven:4) )No results found for this or any previous visit (from the past 240 hours).   Radiological Exams on Admission:   Assessment/Plan Principal Problem:   Hematuria Active Problems:   CAD (coronary artery disease)   HTN (hypertension)   HLD (hyperlipidemia)   Chronic kidney disease, stage 3b (HCC)   Anxiety   Chronic pain syndrome   Overweight (BMI 25.0-29.9)   Assessment and Plan:   Hematuria: Hemoglobin stable 14.2.  CT scan showed new mild right hydronephrosis, possible hemorrhagic material seen right renal collecting system and hematoma in posterior bladder.  ED physician consulted Dr. Twylla of urology.  -place in tele bed for obs -ordered three-way Foley catheter and continuous bladder irrigation - Hold Effient  - Pain control: As needed morphine ,  oxycodone , low-dose Tylenol   CAD (coronary artery disease): s/p of stent. No CP. -hold Effient  - Continue Crestor   HTN (hypertension) -IV hydralazine  as needed - Cardizem , metoprolol  - Hold Hygroton  HLD (hyperlipidemia) -Crestor   Chronic kidney disease, stage 3b (HCC): Renal function stable.  Recent creatinine 2.0 on 01/22/2023.  His creatinine is 1.79, BUN 28, GFR 41 -Follow-up with BMP  Anxiety -Continue as needed Xanax   Chronic pain syndrome -Continue home oxycodone .  Overweight (BMI 25.0-29.9): Body weight 92 kg, BMI 29.95 - Encourage losing weight - Exercise healthy diet      DVT ppx: SCD  Code Status: Full code   Family Communication:    Yes, patient's wife at bed side.    Disposition Plan:  Anticipate discharge back to previous environment  Consults called:   Dr. Twylla of urology is consulted by EDP.  Admission status and Level of care: Telemetry Medical:    for obs     Dispo: The patient is from: Home              Anticipated d/c is to: Home              Anticipated d/c date is: 1 day  Patient currently is not medically stable to d/c.    Severity of Illness:  The appropriate patient status for this patient is OBSERVATION. Observation status is judged to be reasonable and necessary in order to provide the required intensity of service to ensure the patient's safety. The patient's presenting symptoms, physical exam findings, and initial radiographic and laboratory data in the context of their medical condition is felt to place them at decreased risk for further clinical deterioration. Furthermore, it is anticipated that the patient will be medically stable for discharge from the hospital within 2 midnights of admission.        Date of Service 08/27/2023    Caleb Exon [facility]   If 7PM-7AM, please contact night-coverage [email or web address] 08/27/2023, 1:12 AM

## 2023-08-26 NOTE — H&P (Incomplete)
 History and Physical    Kenneth Osborn FMW:[id number] DOB: 1955-03-29 DOA: 08/26/2023  Referring MD/NP/PA:   PCP: Valora Agent, MD   Patient coming from:  The patient is coming from home.     Chief Complaint: Hematuria  HPI: Kenneth Osborn is a 68 y.o. 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  Patient states that his hematuria started this morning. It has been worsening throughout the day.  He states that he has difficult initiating his urine stream, with blood clot coming out.  He also has pain in his penis, which is constant, sharp, moderate, radiating to the groin area.  He has mild suprapubic abdominal discomfort and nausea.  No vomiting or diarrhea.  No fever or chills.  No burning on urination.  Patient does not have chest pain, cough, SOB.  Data reviewed independently and ED Course: pt was found to have WBC 7.7, stable renal function, UA with WBC 0-5, INR 1.0, PTT 28, hemoglobin 14.2.  Temperature normal, blood pressure 153/104, heart rate of 107, RR 18, oxygen saturation 98% on room air.  Patient is placed in telemetry bed for observation.  Dr. Twylla of urology is consulted by EDP.  CT abdomen/pelvis:   EKG:   Not done in ED, will get one.      Review of Systems:   General: no fevers, chills, no body weight gain, has poor appetite, has fatigue HEENT: no blurry vision, hearing changes or sore throat Respiratory: no dyspnea, coughing, wheezing CV: no chest pain, no palpitations GI: no nausea, vomiting, abdominal pain, diarrhea, constipation GU: no dysuria, burning on urination, increased urinary frequency, hematuria  Ext: no leg edema Neuro: no unilateral weakness, numbness, or tingling, no vision change or hearing loss Skin: no rash, no skin tear. MSK: No  muscle spasm, no deformity, no limitation of range of movement in spin Heme: No easy bruising.  Travel history: No recent long distant travel.   Allergy: No Known Allergies  Past Medical History:  Diagnosis Date  . Acute postoperative pain 12/03/2018  . Allergic rhinitis 12/30/2012  . Anemia   . Anginal pain (HCC)   . Anxiety   . Bronchitis    hx of  . Can't get food down 08/12/2014  . Cancer (HCC) 01/2020   kidney   . Chronic back pain    thoracic area  . Concussion 09/2015  . Coronary artery disease    99% blockage  . DDD (degenerative disc disease), cervical   . Dehydration symptoms    2019  . Diabetes mellitus without complication (HCC)    insulin  dependent  . Dysphagia   . GERD (gastroesophageal reflux disease)   . History of Meniere's disease 12/21/2014  . History of thoracic spine surgery (S/P T9-10 IVD spacer) 12/21/2014  . Hypercalcemia   . Hypercholesteremia   . Hyperkalemia   . Hyperlipidemia   . Hypertension    sees Dr. Norleen walker Maryl  . Meniere's disease    deaf in right ear, takes diazepam   . Myocardial infarction [facility]) 2012   Sees Dr. Jettie, [facility]  . Neuromuscular disorder (HCC)    diabetic neuropathy in feet  . Proteinuria   . Renal cell carcinoma (HCC)   . Renal disorder   . Secondary hyperparathyroidism of renal origin (HCC)   . Severe  sepsis (HCC) 02/07/2020  . Short-segment Barrett's esophagus   . Sleep apnea    mild    Past Surgical History:  Procedure Laterality Date  . APPENDECTOMY     age 58  . ARTHRODESIS ANTERIOR ANTERIOR CERVICLE SPINE  01/04/2013  . BACK SURGERY     fusion thoracic area  . CARDIAC CATHETERIZATION     may 2012 and Nov 20, 2010  . CARDIAC CATHETERIZATION N/A 06/15/2015   Procedure: Left Heart Cath and Coronary Angiography;  Surgeon: Wolm JINNY Rhyme, MD;  Location: [facility] INVASIVE CV LAB;  Service: Cardiovascular;  Laterality: N/A;  . CARDIAC CATHETERIZATION N/A 06/15/2015   Procedure:  Coronary Stent Intervention;  Surgeon: Marsa Dooms, MD;  Location: [facility] INVASIVE CV LAB;  Service: Cardiovascular;  Laterality: N/A;  . CARDIOVASCULAR STRESS TEST     jan 2014  . COLONOSCOPY WITH PROPOFOL  N/A 09/19/2014   Procedure: COLONOSCOPY WITH PROPOFOL ;  Surgeon: Lamar ONEIDA Holmes, MD;  Location: [facility] ENDOSCOPY;  Service: Endoscopy;  Laterality: N/A;  . COLONOSCOPY WITH PROPOFOL  N/A 04/03/2021   Procedure: COLONOSCOPY WITH PROPOFOL ;  Surgeon: Maryruth Ole ONEIDA, MD;  Location: [facility] ENDOSCOPY;  Service: Endoscopy;  Laterality: N/A;  IDDM  . COLONOSCOPY WITH PROPOFOL  N/A 06/25/2023   Procedure: COLONOSCOPY WITH PROPOFOL ;  Surgeon: Toledo, Ladell POUR, MD;  Location: [facility] ENDOSCOPY;  Service: Gastroenterology;  Laterality: N/A;  IDDM, 1ST CASE  . CORONARY ANGIOPLASTY     stent placement x3  . ESOPHAGOGASTRODUODENOSCOPY N/A 09/19/2014   Procedure: ESOPHAGOGASTRODUODENOSCOPY (EGD);  Surgeon: Lamar ONEIDA Holmes, MD;  Location: [facility] ENDOSCOPY;  Service: Endoscopy;  Laterality: N/A;  . IR PERC CHOLECYSTOSTOMY  02/09/2020  . IR RADIOLOGIST EVAL & MGMT  03/14/2020  . LABRINTHECTOMY     1999 right ear  . LUMBAR SPINAL CORD SIMULATOR LEAD REMOVAL Right 08/09/2019   Procedure: REMOVAL SPINAL CORD STIMULATOR PERCUTANEOUS LEADS, REMOVAL PULSE GENERATOR;  Surgeon: Bluford Standing, MD;  Location: [facility] ORS;  Service: Neurosurgery;  Laterality: Right;  LOCAL WITH MAC  . mastoid shunt Bilateral    left, 2002, 1997 right ear, 1980 right ear  . NEPHRECTOMY Left   . PULSE GENERATOR IMPLANT N/A 01/18/2019   Procedure: MEDTRONIC SPINAL CORD STIMULATOR BATTERY EXCHANGE;  Surgeon: Bluford Standing, MD;  Location: [facility] ORS;  Service: Neurosurgery;  Laterality: N/A;  . ROBOT ASSISTED LAPAROSCOPIC NEPHRECTOMY Left 04/20/2020   Procedure: XI ROBOTIC ASSITED LAPAROSCOPIC NEPHRECTOMY;  Surgeon: Renda Glance, MD;  Location: [facility] ORS;  Service: Urology;  Laterality: Left;  . SAVORY DILATION N/A 09/19/2014   Procedure:  SAVORY DILATION;  Surgeon: Lamar ONEIDA Holmes, MD;  Location: [facility] ENDOSCOPY;  Service: Endoscopy;  Laterality: N/A;  . SHOULDER ARTHROSCOPY WITH SUBACROMIAL DECOMPRESSION Left 04/06/2012   Procedure: SHOULDER ARTHROSCOPY WITH SUBACROMIAL DECOMPRESSION;  Surgeon: Marcey Raman, MD;  Location: [facility] OR;  Service: Orthopedics;  Laterality: Left;  left shoulder arthroscopy, subacromial decompression and distal clavicle resection  . SPINAL CORD STIMULATOR IMPLANT Right     Social History:  reports that he has never smoked. He has never used smokeless tobacco. He reports that he does not drink alcohol and does not use drugs.  Family History:  Family History  Problem Relation Age of Onset  . Heart disease Mother   . Diabetes Mother   . Heart disease Father   . Cancer Sister   . Heart disease Maternal Aunt   . Heart disease Maternal Uncle   . Diabetes Maternal Grandmother   . Diabetes Paternal Grandmother      Prior to Admission  medications   Medication Sig Start Date End Date Taking? Authorizing Provider  ALPRAZolam  (XANAX ) 0.5 MG tablet Take 1 tablet (0.5 mg total) by mouth at bedtime. Patient taking differently: Take 0.5 mg by mouth at bedtime. PRN 04/26/20   Lue Elsie BROCKS, MD  cetirizine (ZYRTEC) 10 MG tablet Take 10 mg by mouth daily.     [provider]  chlorthalidone (HYGROTON) 25 MG tablet Take 25 mg by mouth daily. 11/18/22   [provider]  cyclobenzaprine  (FLEXERIL ) 10 MG tablet Take 1 tablet (10 mg total) by mouth 3 (three) times daily as needed for muscle spasms. 04/26/20   Lue Elsie BROCKS, MD  diazepam  (VALIUM ) 5 MG tablet Take 5 mg by mouth 3 (three) times daily as needed. 06/03/22   [provider]  diltiazem  (CARDIZEM  CD) 240 MG 24 hr capsule Take 240 mg by mouth daily. 03/30/18   [provider]  esomeprazole (NEXIUM) 40 MG capsule Take 40 mg by mouth 2 (two) times daily.    [provider]  ezetimibe  (ZETIA ) 10 MG tablet Take 10  mg by mouth daily. 03/26/17   [provider]  gabapentin  (NEURONTIN ) 800 MG tablet Take 800 mg by mouth 2 (two) times daily.    [provider]  Glucagon (BAQSIMI ONE PACK NA) Place 1 Dose into the nose daily as needed (severely low blood sugar).    [provider]  HUMULIN  R U-500 KWIKPEN 500 UNIT/ML KwikPen Inject 65-75 Units into the skin See admin instructions. Sliding scale Can adjust according to blood sugar Home med 11/02/21   Awanda City, MD  isosorbide  mononitrate (IMDUR ) 30 MG 24 hr tablet Take 30 mg by mouth daily.    [provider]  JARDIANCE 25 MG TABS tablet Take 25 mg by mouth daily. 11/13/22 11/13/23  [provider]  latanoprost  (XALATAN ) 0.005 % ophthalmic solution Place 1 drop into both eyes at bedtime. 11/22/22   [provider]  Magnesium  Oxide 250 MG TABS Take 500 mg by mouth daily.    [provider]  meclizine  (ANTIVERT ) 25 MG tablet Take 25 mg by mouth See admin instructions. Take 25 mg in the morning, and additional 25 mg if needed up to a total of three per day for Meniere's disease 07/06/15   [provider]  metFORMIN  (GLUCOPHAGE ) 1000 MG tablet Take 1,000 mg by mouth 2 (two) times daily. 07/25/20   [provider]  metoprolol  succinate (TOPROL -XL) 100 MG 24 hr tablet Take 100 mg by mouth 2 (two) times daily. Take with or immediately following a meal.    [provider]  montelukast  (SINGULAIR ) 10 MG tablet Take 10 mg by mouth at bedtime.     [provider]  naloxone  (NARCAN ) nasal spray 4 mg/0.1 mL Place 1 spray into the nose as needed for up to 365 doses (for opioid-induced respiratory depresssion). In case of emergency (overdose), spray once into each nostril. If no response within 3 minutes, repeat application and call 911. 11/25/22 11/25/23  Tanya Glisson, MD  niacin  (NIASPAN ) 500 MG CR tablet Take 500 mg by mouth at bedtime.    [provider]  nitroGLYCERIN   (NITROSTAT ) 0.4 MG SL tablet Place 0.4 mg under the tongue as needed. 08/03/20   [provider]  oxyCODONE  (OXY IR/ROXICODONE ) 5 MG immediate release tablet Take 1 tablet (5 mg total) by mouth every 8 (eight) hours. Must last 30 days. 07/18/23 08/17/23  Patel, Seema K, NP  oxyCODONE  (OXY IR/ROXICODONE ) 5  MG immediate release tablet Take 1 tablet (5 mg total) by mouth every 8 (eight) hours. Must last 30 days. 08/17/23 09/16/23  Patel, Seema K, NP  oxyCODONE  (OXY IR/ROXICODONE ) 5 MG immediate release tablet Take 1 tablet (5 mg total) by mouth every 8 (eight) hours. Must last 30 days. 09/16/23 10/16/23  Patel, Seema K, NP  prasugrel  (EFFIENT ) 10 MG TABS tablet Take 10 mg by mouth daily. 11/22/19   [provider]  rosuvastatin  (CRESTOR ) 40 MG tablet Take 40 mg by mouth at bedtime.    [provider]  senna-docusate (SENOKOT-S) 8.6-50 MG tablet Take 1 tablet by mouth 2 (two) times daily. 04/26/20   Lue Elsie BROCKS, MD    Physical Exam: Vitals:   08/26/23 1634 08/26/23 1636  BP: (!) 153/104   Pulse: (!) 107   Resp: 18   Temp: 98.3 F (36.8 C)   TempSrc: Oral   SpO2: 98%   Weight:  92 kg  Height:  5' 9 (1.753 m)   General: Not in acute distress HEENT:       Eyes: PERRL, EOMI, no jaundice       ENT: No discharge from the ears and nose, no pharynx injection, no tonsillar enlargement.        Neck: No JVD, no bruit, no mass felt. Heme: No neck lymph node enlargement. Cardiac: S1/S2, RRR, No murmurs, No gallops or rubs. Respiratory: No rales, wheezing, rhonchi or rubs. GI: Soft, nondistended, nontender, no rebound pain, no organomegaly, BS present. GU: No hematuria Ext: No pitting leg edema bilaterally. 1+DP/PT pulse bilaterally. Musculoskeletal: No joint deformities, No joint redness or warmth, no limitation of ROM in spin. Skin: No rashes.  Neuro: Alert, oriented X3, cranial nerves II-XII grossly intact, moves all extremities normally. Muscle strength 5/5 in all  extremities, sensation to light touch intact. Brachial reflex 2+ bilaterally. Knee reflex 1+ bilaterally. Negative Babinski's sign. Normal finger to nose test. Psych: Patient is not psychotic, no suicidal or hemocidal ideation.  Labs on Admission: I have personally reviewed following labs and imaging studies  CBC: Recent Labs  Lab 08/26/23 1639  WBC 7.7  NEUTROABS 5.1  HGB 14.2  HCT 45.0  MCV 83.2  PLT 237   Basic Metabolic Panel: Recent Labs  Lab 08/26/23 1639  NA 138  K 4.7  CL 104  CO2 24  GLUCOSE 281*  BUN 28*  CREATININE 1.79*  CALCIUM  9.5   GFR: Estimated Creatinine Clearance: 44.2 mL/min (A) (by C-G formula based on SCr of 1.79 mg/dL (H)). Liver Function Tests: Recent Labs  Lab 08/26/23 1639  AST 50*  ALT 55*  ALKPHOS 92  BILITOT 0.6  PROT 7.4  ALBUMIN  3.7   No results for input(s): LIPASE, AMYLASE in the last 168 hours. No results for input(s): AMMONIA in the last 168 hours. Coagulation Profile: Recent Labs  Lab 08/26/23 1639  INR 1.0   Cardiac Enzymes: No results for input(s): CKTOTAL, CKMB, CKMBINDEX, TROPONINI in the last 168 hours. BNP (last 3 results) No results for input(s): PROBNP in the last 8760 hours. HbA1C: No results for input(s): HGBA1C in the last 72 hours. CBG: No results for input(s): GLUCAP in the last 168 hours. Lipid Profile: No results for input(s): CHOL, HDL, LDLCALC, TRIG, CHOLHDL, LDLDIRECT in the last 72 hours. Thyroid  Function Tests: No results for input(s): TSH, T4TOTAL, FREET4, T3FREE, THYROIDAB in the last 72 hours. Anemia Panel: No results for input(s): VITAMINB12, FOLATE, FERRITIN, TIBC, IRON, RETICCTPCT in the last 72 hours. Urine  analysis:    Component Value Date/Time   COLORURINE RED (A) 08/26/2023 1934   APPEARANCEUR TURBID (A) 08/26/2023 1934   LABSPEC  08/26/2023 1934    TEST NOT REPORTED DUE TO COLOR INTERFERENCE OF URINE PIGMENT   PHURINE   08/26/2023 1934    TEST NOT REPORTED DUE TO COLOR INTERFERENCE OF URINE PIGMENT   GLUCOSEU (A) 08/26/2023 1934    TEST NOT REPORTED DUE TO COLOR INTERFERENCE OF URINE PIGMENT   HGBUR (A) 08/26/2023 1934    TEST NOT REPORTED DUE TO COLOR INTERFERENCE OF URINE PIGMENT   BILIRUBINUR (A) 08/26/2023 1934    TEST NOT REPORTED DUE TO COLOR INTERFERENCE OF URINE PIGMENT   KETONESUR (A) 08/26/2023 1934    TEST NOT REPORTED DUE TO COLOR INTERFERENCE OF URINE PIGMENT   PROTEINUR (A) 08/26/2023 1934    TEST NOT REPORTED DUE TO COLOR INTERFERENCE OF URINE PIGMENT   NITRITE (A) 08/26/2023 1934    TEST NOT REPORTED DUE TO COLOR INTERFERENCE OF URINE PIGMENT   LEUKOCYTESUR (A) 08/26/2023 1934    TEST NOT REPORTED DUE TO COLOR INTERFERENCE OF URINE PIGMENT   Sepsis Labs: @LABRCNTIP (procalcitonin:4,lacticidven:4) )No results found for this or any previous visit (from the past 240 hours).   Radiological Exams on Admission:   Assessment/Plan Active Problems:   * No active hospital problems. *   Assessment and Plan: No notes have been filed under this hospital service. Service: Hospitalist      Active Problems:   * No active hospital problems. *    DVT ppx: SQ Heparin          SQ Lovenox   Code Status: Full code   ***  Family Communication:     not done, no family member is at bed side.              Yes, patient's    at bed side.       by phone   ***  Disposition Plan:  Anticipate discharge back to previous environment  Consults called:   Dr. Twylla of urology is consulted by EDP.  Admission status and Level of care: :    for obs as inpt        Dispo: The patient is from: {From:23814}              Anticipated d/c is to: {To:23815}              Anticipated d/c date is: {Days:23816}              Patient currently {Medically stable:23817}    Severity of Illness:  {Observation/Inpatient:21159}       Date of Service 08/26/2023    Caleb Exon [facility]   If  7PM-7AM, please contact night-coverage [email or web address] 08/26/2023, 10:11 PM

## 2023-08-26 NOTE — ED Triage Notes (Addendum)
 Pt noted blood in his urine at 6am with scrotal pain. Pt reports subsequent episodes of hematuria throughout the day with worsening groin pain. PT with hx of nephrectomy march 2022. Pt is on blood thinners. Pt sent by pcp for further evaluation.

## 2023-08-27 ENCOUNTER — Observation Stay

## 2023-08-27 DIAGNOSIS — N1339 Other hydronephrosis: Secondary | ICD-10-CM | POA: Diagnosis present

## 2023-08-27 DIAGNOSIS — N1832 Chronic kidney disease, stage 3b: Secondary | ICD-10-CM | POA: Diagnosis present

## 2023-08-27 DIAGNOSIS — Z7984 Long term (current) use of oral hypoglycemic drugs: Secondary | ICD-10-CM | POA: Diagnosis not present

## 2023-08-27 DIAGNOSIS — N3289 Other specified disorders of bladder: Secondary | ICD-10-CM

## 2023-08-27 DIAGNOSIS — E663 Overweight: Secondary | ICD-10-CM | POA: Diagnosis not present

## 2023-08-27 DIAGNOSIS — J9600 Acute respiratory failure, unspecified whether with hypoxia or hypercapnia: Secondary | ICD-10-CM | POA: Diagnosis not present

## 2023-08-27 DIAGNOSIS — R001 Bradycardia, unspecified: Secondary | ICD-10-CM | POA: Diagnosis not present

## 2023-08-27 DIAGNOSIS — E1122 Type 2 diabetes mellitus with diabetic chronic kidney disease: Secondary | ICD-10-CM | POA: Diagnosis present

## 2023-08-27 DIAGNOSIS — J9601 Acute respiratory failure with hypoxia: Secondary | ICD-10-CM | POA: Diagnosis not present

## 2023-08-27 DIAGNOSIS — N189 Chronic kidney disease, unspecified: Secondary | ICD-10-CM | POA: Diagnosis not present

## 2023-08-27 DIAGNOSIS — Z7902 Long term (current) use of antithrombotics/antiplatelets: Secondary | ICD-10-CM | POA: Diagnosis not present

## 2023-08-27 DIAGNOSIS — I447 Left bundle-branch block, unspecified: Secondary | ICD-10-CM | POA: Diagnosis present

## 2023-08-27 DIAGNOSIS — G894 Chronic pain syndrome: Secondary | ICD-10-CM | POA: Diagnosis present

## 2023-08-27 DIAGNOSIS — R9431 Abnormal electrocardiogram [ECG] [EKG]: Secondary | ICD-10-CM | POA: Diagnosis not present

## 2023-08-27 DIAGNOSIS — D62 Acute posthemorrhagic anemia: Secondary | ICD-10-CM | POA: Diagnosis not present

## 2023-08-27 DIAGNOSIS — Z794 Long term (current) use of insulin: Secondary | ICD-10-CM | POA: Diagnosis not present

## 2023-08-27 DIAGNOSIS — E875 Hyperkalemia: Secondary | ICD-10-CM | POA: Diagnosis present

## 2023-08-27 DIAGNOSIS — G928 Other toxic encephalopathy: Secondary | ICD-10-CM | POA: Diagnosis present

## 2023-08-27 DIAGNOSIS — R310 Gross hematuria: Secondary | ICD-10-CM | POA: Diagnosis present

## 2023-08-27 DIAGNOSIS — E1140 Type 2 diabetes mellitus with diabetic neuropathy, unspecified: Secondary | ICD-10-CM | POA: Diagnosis present

## 2023-08-27 DIAGNOSIS — Z6832 Body mass index (BMI) 32.0-32.9, adult: Secondary | ICD-10-CM | POA: Diagnosis not present

## 2023-08-27 DIAGNOSIS — N2581 Secondary hyperparathyroidism of renal origin: Secondary | ICD-10-CM | POA: Diagnosis present

## 2023-08-27 DIAGNOSIS — K567 Ileus, unspecified: Secondary | ICD-10-CM | POA: Diagnosis present

## 2023-08-27 DIAGNOSIS — I2510 Atherosclerotic heart disease of native coronary artery without angina pectoris: Secondary | ICD-10-CM | POA: Diagnosis present

## 2023-08-27 DIAGNOSIS — I10 Essential (primary) hypertension: Secondary | ICD-10-CM | POA: Diagnosis not present

## 2023-08-27 DIAGNOSIS — G929 Unspecified toxic encephalopathy: Secondary | ICD-10-CM | POA: Diagnosis not present

## 2023-08-27 DIAGNOSIS — E8721 Acute metabolic acidosis: Secondary | ICD-10-CM | POA: Diagnosis not present

## 2023-08-27 DIAGNOSIS — N179 Acute kidney failure, unspecified: Secondary | ICD-10-CM | POA: Diagnosis present

## 2023-08-27 DIAGNOSIS — F419 Anxiety disorder, unspecified: Secondary | ICD-10-CM | POA: Diagnosis present

## 2023-08-27 DIAGNOSIS — E872 Acidosis, unspecified: Secondary | ICD-10-CM | POA: Diagnosis present

## 2023-08-27 DIAGNOSIS — I129 Hypertensive chronic kidney disease with stage 1 through stage 4 chronic kidney disease, or unspecified chronic kidney disease: Secondary | ICD-10-CM | POA: Diagnosis present

## 2023-08-27 DIAGNOSIS — Z905 Acquired absence of kidney: Secondary | ICD-10-CM | POA: Diagnosis not present

## 2023-08-27 DIAGNOSIS — R319 Hematuria, unspecified: Secondary | ICD-10-CM

## 2023-08-27 DIAGNOSIS — E7800 Pure hypercholesterolemia, unspecified: Secondary | ICD-10-CM | POA: Diagnosis present

## 2023-08-27 DIAGNOSIS — N400 Enlarged prostate without lower urinary tract symptoms: Secondary | ICD-10-CM | POA: Diagnosis present

## 2023-08-27 DIAGNOSIS — E871 Hypo-osmolality and hyponatremia: Secondary | ICD-10-CM | POA: Diagnosis present

## 2023-08-27 DIAGNOSIS — N1831 Chronic kidney disease, stage 3a: Secondary | ICD-10-CM | POA: Diagnosis not present

## 2023-08-27 LAB — CBC
HCT: 47.4 % (ref 39.0–52.0)
Hemoglobin: 15.4 g/dL (ref 13.0–17.0)
MCH: 26.2 pg (ref 26.0–34.0)
MCHC: 32.5 g/dL (ref 30.0–36.0)
MCV: 80.7 fL (ref 80.0–100.0)
Platelets: 210 K/uL (ref 150–400)
RBC: 5.87 MIL/uL — ABNORMAL HIGH (ref 4.22–5.81)
RDW: 15.1 % (ref 11.5–15.5)
WBC: 10.6 K/uL — ABNORMAL HIGH (ref 4.0–10.5)
nRBC: 0 % (ref 0.0–0.2)

## 2023-08-27 LAB — GLUCOSE, CAPILLARY
Glucose-Capillary: 229 mg/dL — ABNORMAL HIGH (ref 70–99)
Glucose-Capillary: 277 mg/dL — ABNORMAL HIGH (ref 70–99)
Glucose-Capillary: 265 mg/dL — ABNORMAL HIGH (ref 70–99)
Glucose-Capillary: 241 mg/dL — ABNORMAL HIGH (ref 70–99)

## 2023-08-27 LAB — BASIC METABOLIC PANEL WITH GFR
Anion gap: 13 (ref 5–15)
BUN: 33 mg/dL — ABNORMAL HIGH (ref 8–23)
CO2: 21 mmol/L — ABNORMAL LOW (ref 22–32)
Calcium: 9.1 mg/dL (ref 8.9–10.3)
Chloride: 103 mmol/L (ref 98–111)
Creatinine, Ser: 2.69 mg/dL — ABNORMAL HIGH (ref 0.61–1.24)
GFR, Estimated: 25 mL/min — ABNORMAL LOW (ref >60)
Glucose, Bld: 252 mg/dL — ABNORMAL HIGH (ref 70–99)
Potassium: 4.6 mmol/L (ref 3.5–5.1)
Sodium: 137 mmol/L (ref 135–145)

## 2023-08-27 LAB — CBG MONITORING, ED: Glucose-Capillary: 201 mg/dL — ABNORMAL HIGH (ref 70–99)

## 2023-08-27 LAB — HIV ANTIBODY (ROUTINE TESTING W REFLEX): HIV Screen 4th Generation wRfx: NONREACTIVE

## 2023-08-27 MED ORDER — GABAPENTIN 400 MG PO CAPS
800.0000 mg | ORAL_CAPSULE | Freq: Two times a day (BID) | ORAL | Status: DC
  Administered 2023-08-27 – 2023-08-29 (×5): 800 mg via ORAL
  Filled 2023-08-27 (×5): qty 2

## 2023-08-27 MED ORDER — DILTIAZEM HCL ER COATED BEADS 240 MG PO CP24
240.0000 mg | ORAL_CAPSULE | Freq: Every day | ORAL | Status: DC
  Administered 2023-08-27 – 2023-08-28 (×2): 240 mg via ORAL
  Filled 2023-08-27 (×2): qty 2

## 2023-08-27 MED ORDER — HYDRALAZINE HCL 20 MG/ML IJ SOLN
10.0000 mg | INTRAMUSCULAR | Status: DC | PRN
  Administered 2023-08-27 – 2023-08-31 (×4): 10 mg via INTRAVENOUS
  Filled 2023-08-27 (×4): qty 1

## 2023-08-27 MED ORDER — PROMETHAZINE HCL 6.25 MG/5ML PO SOLN: 12.5000 mg | Freq: Four times a day (QID) | ORAL | Status: DC | PRN

## 2023-08-27 MED ORDER — NITROGLYCERIN 0.4 MG SL SUBL: 0.4000 mg | SUBLINGUAL_TABLET | SUBLINGUAL | Status: DC | PRN

## 2023-08-27 MED ORDER — METOPROLOL SUCCINATE ER 50 MG PO TB24
100.0000 mg | ORAL_TABLET | Freq: Two times a day (BID) | ORAL | Status: DC
  Administered 2023-08-27 – 2023-08-28 (×3): 100 mg via ORAL
  Filled 2023-08-27 (×3): qty 1

## 2023-08-27 MED ORDER — CHLORHEXIDINE GLUCONATE CLOTH 2 % EX PADS
6.0000 | MEDICATED_PAD | Freq: Every day | CUTANEOUS | Status: DC
  Administered 2023-08-27 – 2023-09-03 (×8): 6 via TOPICAL

## 2023-08-27 MED ORDER — INSULIN REGULAR HUMAN (CONC) 500 UNIT/ML ~~LOC~~ SOPN
50.0000 [IU] | PEN_INJECTOR | Freq: Three times a day (TID) | SUBCUTANEOUS | Status: DC
  Administered 2023-08-27 – 2023-08-28 (×3): 50 [IU] via SUBCUTANEOUS
  Filled 2023-08-27: qty 3

## 2023-08-27 MED ORDER — OXYCODONE HCL 5 MG PO TABS
10.0000 mg | ORAL_TABLET | Freq: Four times a day (QID) | ORAL | Status: DC | PRN
  Administered 2023-08-27 – 2023-09-02 (×8): 10 mg via ORAL
  Filled 2023-08-27 (×8): qty 2

## 2023-08-27 MED ORDER — ALPRAZOLAM 0.5 MG PO TABS
0.5000 mg | ORAL_TABLET | Freq: Every evening | ORAL | Status: DC | PRN
  Administered 2023-08-29 – 2023-09-01 (×2): 0.5 mg via ORAL
  Filled 2023-08-27 (×2): qty 1

## 2023-08-27 MED ORDER — LATANOPROST 0.005 % OP SOLN
1.0000 [drp] | Freq: Every day | OPHTHALMIC | Status: DC
  Administered 2023-08-27 – 2023-09-02 (×7): 1 [drp] via OPHTHALMIC
  Filled 2023-08-27 (×2): qty 2.5

## 2023-08-27 MED ORDER — ACETAMINOPHEN 325 MG PO TABS
325.0000 mg | ORAL_TABLET | Freq: Four times a day (QID) | ORAL | Status: DC | PRN
  Administered 2023-08-27 – 2023-08-31 (×5): 325 mg via ORAL
  Filled 2023-08-27 (×5): qty 1

## 2023-08-27 MED ORDER — METOPROLOL SUCCINATE ER 50 MG PO TB24
50.0000 mg | ORAL_TABLET | Freq: Once | ORAL | Status: AC
  Administered 2023-08-27: 50 mg via ORAL
  Filled 2023-08-27: qty 1

## 2023-08-27 MED ORDER — ROSUVASTATIN CALCIUM 10 MG PO TABS
40.0000 mg | ORAL_TABLET | Freq: Every day | ORAL | Status: DC
  Administered 2023-08-27 – 2023-09-02 (×7): 40 mg via ORAL
  Filled 2023-08-27 (×6): qty 4

## 2023-08-27 NOTE — Consult Note (Signed)
 Urology Consult I have been asked to see the patient by Dr. Hilma, for evaluation and management of hematuria.  Chief Complaint: Groin pain, gross hematuria  History of Present Illness: Kenneth Osborn is a 68 y.o. 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 Renda in 2022, diabetes on Jardiance, and CAD on Effient  who presented to the ED yesterday with reports of gross hematuria and progressive groin pain.  Foley catheter has been placed and he has been started on CBI.  CTAP with contrast on presentation notable for mild right hydronephrosis with hyperdense material in the right renal collecting system and bladder consistent with blood product.  Admission labs notable for white count 7.7; hemoglobin 14.2; creatinine 1.79, baseline 2.2; and UA with >50 RBC/hpf, no nitrites, pyuria, or bacteriuria.  This morning, white count is up, 10.6, and hemoglobin is up, 15.4.  Fattening also up, 2.69.  He reports sudden onset gross hematuria yesterday morning.  No prior episodes of gross hematuria.  He developed some penile and RLQ pain as the day progressed yesterday.  Today he has felt some bladder discomfort and like he needs to void.  He has continued to follow-up with Dr. Renda, most recently last month.  Past Medical History:  Diagnosis Date   Acute postoperative pain 12/03/2018   Allergic rhinitis 12/30/2012   Anemia    Anginal pain (HCC)    Anxiety    Bronchitis    hx of   Can't get food down 08/12/2014   Cancer (HCC) 01/2020   kidney    Chronic back pain    thoracic area   Concussion 09/2015   Coronary artery disease    99% blockage   DDD (degenerative disc disease), cervical    Dehydration symptoms    2019   Diabetes mellitus without complication (HCC)    insulin  dependent   Dysphagia    GERD (gastroesophageal reflux disease)    History of Meniere's disease 12/21/2014   History  of thoracic spine surgery (S/P T9-10 IVD spacer) 12/21/2014   Hypercalcemia    Hypercholesteremia    Hyperkalemia    Hyperlipidemia    Hypertension    sees Dr. 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 Jettie, [facility]   Neuromuscular disorder [facility])    diabetic neuropathy in feet   Proteinuria    Renal cell carcinoma (HCC)    Renal disorder    Secondary hyperparathyroidism of renal origin (HCC)    Severe sepsis (HCC) 02/07/2020   Short-segment Barrett's esophagus    Sleep apnea    mild    Past Surgical History:  Procedure Laterality Date   APPENDECTOMY     age 8   ARTHRODESIS ANTERIOR ANTERIOR CERVICLE SPINE  01/04/2013   BACK SURGERY     fusion thoracic area   CARDIAC CATHETERIZATION     may 2012 and Nov 20, 2010   CARDIAC CATHETERIZATION N/A 06/15/2015   Procedure: Left Heart Cath and Coronary Angiography;  Surgeon: Wolm JINNY Rhyme, MD;  Location: [facility] INVASIVE CV LAB;  Service: Cardiovascular;  Laterality: N/A;   CARDIAC CATHETERIZATION N/A 06/15/2015   Procedure: Coronary Stent Intervention;  Surgeon: Marsa Dooms, MD;  Location: [facility] INVASIVE CV LAB;  Service: Cardiovascular;  Laterality: N/A;   CARDIOVASCULAR STRESS TEST     jan 2014   COLONOSCOPY  WITH PROPOFOL  N/A 09/19/2014   Procedure: COLONOSCOPY WITH PROPOFOL ;  Surgeon: Lamar ONEIDA Holmes, MD;  Location: [facility] ENDOSCOPY;  Service: Endoscopy;  Laterality: N/A;   COLONOSCOPY WITH PROPOFOL  N/A 04/03/2021   Procedure: COLONOSCOPY WITH PROPOFOL ;  Surgeon: Maryruth Ole ONEIDA, MD;  Location: [facility] ENDOSCOPY;  Service: Endoscopy;  Laterality: N/A;  IDDM   COLONOSCOPY WITH PROPOFOL  N/A 06/25/2023   Procedure: COLONOSCOPY WITH PROPOFOL ;  Surgeon: Toledo, Ladell POUR, MD;  Location: [facility] ENDOSCOPY;  Service: Gastroenterology;  Laterality: N/A;  IDDM, 1ST CASE   CORONARY ANGIOPLASTY     stent placement x3    ESOPHAGOGASTRODUODENOSCOPY N/A 09/19/2014   Procedure: ESOPHAGOGASTRODUODENOSCOPY (EGD);  Surgeon: Lamar ONEIDA Holmes, MD;  Location: [facility] ENDOSCOPY;  Service: Endoscopy;  Laterality: N/A;   IR PERC CHOLECYSTOSTOMY  02/09/2020   IR RADIOLOGIST EVAL & MGMT  03/14/2020   LABRINTHECTOMY     1999 right ear   LUMBAR SPINAL CORD SIMULATOR LEAD REMOVAL Right 08/09/2019   Procedure: REMOVAL SPINAL CORD STIMULATOR PERCUTANEOUS LEADS, REMOVAL PULSE GENERATOR;  Surgeon: Bluford Standing, MD;  Location: [facility] ORS;  Service: Neurosurgery;  Laterality: Right;  LOCAL WITH MAC   mastoid shunt Bilateral    left, 2002, 1997 right ear, 1980 right ear   NEPHRECTOMY Left    PULSE GENERATOR IMPLANT N/A 01/18/2019   Procedure: MEDTRONIC SPINAL CORD STIMULATOR BATTERY EXCHANGE;  Surgeon: Bluford Standing, MD;  Location: [facility] ORS;  Service: Neurosurgery;  Laterality: N/A;   ROBOT ASSISTED LAPAROSCOPIC NEPHRECTOMY Left 04/20/2020   Procedure: XI ROBOTIC ASSITED LAPAROSCOPIC NEPHRECTOMY;  Surgeon: Renda Glance, MD;  Location: [facility] ORS;  Service: Urology;  Laterality: Left;   SAVORY DILATION N/A 09/19/2014   Procedure: SAVORY DILATION;  Surgeon: Lamar ONEIDA Holmes, MD;  Location: [facility] ENDOSCOPY;  Service: Endoscopy;  Laterality: N/A;   SHOULDER ARTHROSCOPY WITH SUBACROMIAL DECOMPRESSION Left 04/06/2012   Procedure: SHOULDER ARTHROSCOPY WITH SUBACROMIAL DECOMPRESSION;  Surgeon: Marcey Raman, MD;  Location: [facility] OR;  Service: Orthopedics;  Laterality: Left;  left shoulder arthroscopy, subacromial decompression and distal clavicle resection   SPINAL CORD STIMULATOR IMPLANT Right     Home Medications:  Current Meds  Medication Sig   ALPRAZolam  (XANAX ) 0.5 MG tablet Take 1 tablet (0.5 mg total) by mouth at bedtime. (Patient taking differently: Take 0.5 mg by mouth at bedtime. PRN)   cetirizine (ZYRTEC) 10 MG tablet Take 10 mg by mouth daily.    chlorthalidone (HYGROTON) 25 MG tablet Take 25 mg by mouth daily.   cyclobenzaprine   (FLEXERIL ) 10 MG tablet Take 1 tablet (10 mg total) by mouth 3 (three) times daily as needed for muscle spasms.   diazepam  (VALIUM ) 5 MG tablet Take 5 mg by mouth 3 (three) times daily as needed.   diltiazem  (CARDIZEM  CD) 240 MG 24 hr capsule Take 240 mg by mouth daily.   esomeprazole (NEXIUM) 40 MG capsule Take 40 mg by mouth 2 (two) times daily.   ezetimibe  (ZETIA ) 10 MG tablet Take 10 mg by mouth daily.   gabapentin  (NEURONTIN ) 800 MG tablet Take 800 mg by mouth 2 (two) times daily.   Glucagon (BAQSIMI ONE PACK NA) Place 1 Dose into the nose daily as needed (severely low blood sugar).   HUMULIN  R U-500 KWIKPEN 500 UNIT/ML KwikPen Inject 65-75 Units into the skin See admin instructions. Sliding scale Can adjust according to blood sugar Home med   isosorbide  mononitrate (IMDUR ) 30 MG 24 hr tablet Take 30 mg by mouth daily.   JARDIANCE 25 MG TABS tablet Take 25 mg by  mouth daily.   latanoprost  (XALATAN ) 0.005 % ophthalmic solution Place 1 drop into both eyes at bedtime.   lubiprostone (AMITIZA) 24 MCG capsule Take 24 mcg by mouth 2 (two) times daily with a meal.   Magnesium  Oxide 250 MG TABS Take 500 mg by mouth daily.   meclizine  (ANTIVERT ) 25 MG tablet Take 25 mg by mouth See admin instructions. Take 25 mg in the morning, and additional 25 mg if needed up to a total of three per day for Meniere's disease   metFORMIN  (GLUCOPHAGE ) 1000 MG tablet Take 1,000 mg by mouth 2 (two) times daily.   metoprolol  succinate (TOPROL -XL) 100 MG 24 hr tablet Take 100 mg by mouth 2 (two) times daily. Take with or immediately following a meal.   montelukast  (SINGULAIR ) 10 MG tablet Take 10 mg by mouth at bedtime.    naloxone  (NARCAN ) nasal spray 4 mg/0.1 mL Place 1 spray into the nose as needed for up to 365 doses (for opioid-induced respiratory depresssion). In case of emergency (overdose), spray once into each nostril. If no response within 3 minutes, repeat application and call 911.   niacin  (NIASPAN ) 500 MG CR  tablet Take 500 mg by mouth at bedtime.   nitroGLYCERIN  (NITROSTAT ) 0.4 MG SL tablet Place 0.4 mg under the tongue as needed.   oxyCODONE  (OXY IR/ROXICODONE ) 5 MG immediate release tablet Take 1 tablet (5 mg total) by mouth every 8 (eight) hours. Must last 30 days.   prasugrel  (EFFIENT ) 10 MG TABS tablet Take 10 mg by mouth daily.   rosuvastatin  (CRESTOR ) 40 MG tablet Take 40 mg by mouth at bedtime.    Allergies: No Known Allergies  Family History  Problem Relation Age of Onset   Heart disease Mother    Diabetes Mother    Heart disease Father    Cancer Sister    Heart disease Maternal Aunt    Heart disease Maternal Uncle    Diabetes Maternal Grandmother    Diabetes Paternal Grandmother     Social History:  reports that he has never smoked. He has never used smokeless tobacco. He reports that he does not drink alcohol and does not use drugs.  ROS: A complete review of systems was performed.  All systems are negative except for pertinent findings as noted.  Physical Exam:  Vital signs in last 24 hours: Temp:  [97.7 F (36.5 C)-98.4 F (36.9 C)] 98.2 F (36.8 C) (07/09 0753) Pulse Rate:  [95-109] 108 (07/09 0753) Resp:  [13-18] 16 (07/09 0753) BP: (153-197)/(89-110) 175/100 (07/09 0753) SpO2:  [93 %-98 %] 96 % (07/09 0753) Weight:  [92 kg] 92 kg (07/08 1636) Constitutional:  Alert, no acute distress HEENT: [place] AT, moist mucus membranes Cardiovascular: No clubbing, cyanosis, or edema Respiratory: Normal respiratory effort GU: Bilateral descended testicles, nonenlarged and nontender bilateral epididymides Neurologic: Grossly intact, no focal deficits, moving all 4 extremities Psychiatric: Normal mood and affect  Laboratory Data:  Recent Labs    08/26/23 1639 08/27/23 0348  WBC 7.7 10.6*  HGB 14.2 15.4  HCT 45.0 47.4   Recent Labs    08/26/23 1639 08/27/23 0348  NA 138 137  K 4.7 4.6  CL 104 103  CO2 24 21*  GLUCOSE 281* 252*  BUN 28* 33*  CREATININE 1.79*  2.69*  CALCIUM  9.5 9.1   Recent Labs    08/26/23 1639  INR 1.0   Urinalysis    Component Value Date/Time   COLORURINE RED (A) 08/26/2023 1934   APPEARANCEUR TURBID (  A) 08/26/2023 1934   LABSPEC  08/26/2023 1934    TEST NOT REPORTED DUE TO COLOR INTERFERENCE OF URINE PIGMENT   PHURINE  08/26/2023 1934    TEST NOT REPORTED DUE TO COLOR INTERFERENCE OF URINE PIGMENT   GLUCOSEU (A) 08/26/2023 1934    TEST NOT REPORTED DUE TO COLOR INTERFERENCE OF URINE PIGMENT   HGBUR (A) 08/26/2023 1934    TEST NOT REPORTED DUE TO COLOR INTERFERENCE OF URINE PIGMENT   BILIRUBINUR (A) 08/26/2023 1934    TEST NOT REPORTED DUE TO COLOR INTERFERENCE OF URINE PIGMENT   KETONESUR (A) 08/26/2023 1934    TEST NOT REPORTED DUE TO COLOR INTERFERENCE OF URINE PIGMENT   PROTEINUR (A) 08/26/2023 1934    TEST NOT REPORTED DUE TO COLOR INTERFERENCE OF URINE PIGMENT   NITRITE (A) 08/26/2023 1934    TEST NOT REPORTED DUE TO COLOR INTERFERENCE OF URINE PIGMENT   LEUKOCYTESUR (A) 08/26/2023 1934    TEST NOT REPORTED DUE TO COLOR INTERFERENCE OF URINE PIGMENT   Results for orders placed or performed during the hospital encounter of 12/11/20  Blood culture (routine x 2)     Status: None   Collection Time: 12/11/20 10:02 PM   Specimen: Right Antecubital; Blood  Result Value Ref Range Status   Specimen Description   Final    RIGHT ANTECUBITAL Performed at [facility], [address] [place], [place] [address]    Special Requests   Final    BOTTLES DRAWN AEROBIC AND ANAEROBIC Blood Culture adequate volume Performed at [facility], [address] [place], [place] [address]    Culture   Final    NO GROWTH 5 DAYS Performed at [facility], [address] [place], [place] [address]    Report Status 12/17/2020 FINAL  Final  Blood culture (routine x 2)     Status: None   Collection Time: 12/11/20 10:02 PM   Specimen: BLOOD LEFT FOREARM  Result Value Ref Range  Status   Specimen Description   Final    BLOOD LEFT FOREARM Performed at [facility], [address] [place], [place] [address]    Special Requests   Final    BOTTLES DRAWN AEROBIC AND ANAEROBIC Blood Culture adequate volume Performed at [facility], [address] [place], [place] [address]    Culture   Final    NO GROWTH 5 DAYS Performed at [facility], [address] [place], [place] [address]    Report Status 12/17/2020 FINAL  Final  Urine Culture     Status: Abnormal   Collection Time: 12/11/20 10:02 PM   Specimen: Urine, Clean Catch  Result Value Ref Range Status   Specimen Description   Final    URINE, CLEAN CATCH Performed at [facility], [address] [place], [place] [address]    Special Requests   Final    NONE Performed at [facility], [address] [place], [place] [address]    Culture (A)  Final    <10,000 COLONIES/mL INSIGNIFICANT GROWTH Performed at [facility], [address], [place], [place] [address]    Report Status 12/12/2020 FINAL  Final  Resp Panel by RT-PCR (Flu A&B, Covid) Nasopharyngeal Swab     Status: None   Collection Time: 12/11/20 11:07 PM   Specimen: Nasopharyngeal Swab; Nasopharyngeal(NP) swabs in vial transport medium  Result Value Ref Range Status   SARS Coronavirus 2  by RT PCR NEGATIVE NEGATIVE Final    Comment: (NOTE) SARS-CoV-2 target nucleic acids are NOT DETECTED.  The SARS-CoV-2 RNA is generally detectable in upper respiratory specimens during the acute phase of infection. The lowest concentration of SARS-CoV-2 viral copies this assay can detect is 138 copies/mL. A negative result does not preclude SARS-Cov-2 infection and should not be used as the sole basis for treatment or other patient management decisions. A negative result may occur with  improper specimen collection/handling, submission of specimen other than nasopharyngeal  swab, presence of viral mutation(s) within the areas targeted by this assay, and inadequate number of viral copies(<138 copies/mL). A negative result must be combined with clinical observations, patient history, and epidemiological information. The expected result is Negative.  Fact Sheet for Patients:  BloggerCourse.com  Fact Sheet for Healthcare Providers:  SeriousBroker.it  This test is no t yet approved or cleared by the [place]  FDA and  has been authorized for detection and/or diagnosis of SARS-CoV-2 by FDA under an Emergency Use Authorization (EUA). This EUA will remain  in effect (meaning this test can be used) for the duration of the COVID-19 declaration under Section 564(b)(1) of the Act, 21 U.S.C.section 360bbb-3(b)(1), unless the authorization is terminated  or revoked sooner.       Influenza A by PCR NEGATIVE NEGATIVE Final   Influenza B by PCR NEGATIVE NEGATIVE Final    Comment: (NOTE) The Xpert Xpress SARS-CoV-2/FLU/RSV plus assay is intended as an aid in the diagnosis of influenza from Nasopharyngeal swab specimens and should not be used as a sole basis for treatment. Nasal washings and aspirates are unacceptable for Xpert Xpress SARS-CoV-2/FLU/RSV testing.  Fact Sheet for Patients: BloggerCourse.com  Fact Sheet for Healthcare Providers: SeriousBroker.it  This test is not yet approved or cleared by the [place]  FDA and has been authorized for detection and/or diagnosis of SARS-CoV-2 by FDA under an Emergency Use Authorization (EUA). This EUA will remain in effect (meaning this test can be used) for the duration of the COVID-19 declaration under Section 564(b)(1) of the Act, 21 U.S.C. section 360bbb-3(b)(1), unless the authorization is terminated or revoked.  Performed at [facility], [address] [place], [place] [address]      Radiologic Imaging: CT ABDOMEN PELVIS W CONTRAST Result Date: 08/26/2023 CLINICAL DATA:  Fredericka lower abdominal pain history of nephrectomy EXAM: CT ABDOMEN AND PELVIS WITH CONTRAST TECHNIQUE: Multidetector CT imaging of the abdomen and pelvis was performed using the standard protocol following bolus administration of intravenous contrast. RADIATION DOSE REDUCTION: This exam was performed according to the departmental dose-optimization program which includes automated exposure control, adjustment of the mA and/or kV according to patient size and/or use of iterative reconstruction technique. CONTRAST:  75mL OMNIPAQUE  IOHEXOL  300 MG/ML  SOLN COMPARISON:  CT 11/10/2020, 07/23/2023, 07/23/2022 FINDINGS: Lower chest: Mild scarring at the left lung base. No acute airspace disease. Hepatobiliary: Hepatic steatosis. Cholecystectomy. No biliary dilatation Pancreas: Unremarkable. No pancreatic ductal dilatation or surrounding inflammatory changes. Spleen: Normal in size without focal abnormality. Adrenals/Urinary Tract: Normal right adrenal gland. Nonvisualized left adrenal gland. Status post left nephrectomy. New mild right hydronephrosis. Hyperdense material in the right renal collecting system. No definite calcified stone along the course of the ureter. No significant excretion of contrast on delayed images. Vague hyperdense masslike area in the posterior bladder, series 2, image 80. Stomach/Bowel: Stomach nonenlarged. No dilated small bowel. No acute bowel wall thickening. Vascular/Lymphatic: Aortic atherosclerosis. No enlarged abdominal or pelvic lymph nodes. Reproductive: Negative prostate  Other: Negative for pelvic effusion or free air. Subcutaneous infiltration within the anterior abdominal wall as before. Musculoskeletal: No acute or suspicious osseous abnormality IMPRESSION: 1. Status post left nephrectomy. New mild right hydronephrosis. Hyperdense material in the right renal collecting system, suspect for  hemorrhagic material. No definite calcified stone along the course of the ureter. Vague hyperdense masslike area in the posterior bladder, probably represents hemorrhagic material/hematoma, short-term follow-up ultrasound could be obtained to ensure resolution. Poor excretion of contrast from right kidney on delayed views suggesting decreased renal function. 2. Hepatic steatosis. 3. Aortic atherosclerosis. Aortic Atherosclerosis (ICD10-I70.0). Electronically Signed   By: Luke Bun M.D.   On: 08/26/2023 20:51   Bladder Irrigation  Due to gross hematuria/clot retention patient is present today for a bladder irrigation. Patient was cleaned and prepped in a sterile fashion. 1000 mL of sterile water  was instilled into the bladder with a 70mL To[place]y syringe through the catheter in place.  of urine return was cleared from the bladder with evacuation of 75ccs of clot material. Efflux cleared from maroon to light red with the procedure and the catheter irrigated with difficulty. Upon completion, the catheter was draining and was reattached to the night bag for drainage. CBI was resumed at slow drip. Patient tolerated well.   Performed by: Reakwon Barren, PA-C   Assessment & Plan:  68 y.o. male with PMH left RCC s/p nephrectomy with a solitary right kidney managed by Dr. Renda, diabetes on Jardiance, and CAD on Effient , now admitted with gross hematuria and clot retention requiring CBI.  I irrigated him extensively at the bedside this afternoon with some difficulty. I strongly suspect retained, organized clot. I've ordered a limited pelvic US  to confirm this. We discussed that if there is retained clot, we will recommend cystoscopy with clot evacuation, possible fulguration in the OR. Depending on his renal function, may also consider stent placement.  Fortunately, blood count remains stable.  Recommendations: -Continue CBI on slow drip -Limited pelvic ultrasound this afternoon, consider OR  per results -Continue holding Effient   Thank you for involving me in this patient's care, I will continue to follow along.  Saniyah Mondesir, PA-C 08/27/2023 12:48 PM

## 2023-08-27 NOTE — Progress Notes (Signed)
 PROGRESS NOTE    Kenneth Osborn  FMW:[id number] DOB: 02/16/1956 DOA: 08/26/2023 PCP: Valora Agent, MD    Assessment & Plan:   Principal Problem:   Hematuria Active Problems:   CAD (coronary artery disease)   HTN (hypertension)   HLD (hyperlipidemia)   Chronic kidney disease, stage 3b (HCC)   Anxiety   Chronic pain syndrome   Overweight (BMI 25.0-29.9)  Assessment and Plan: Hematuria: possibly secondary to organized clot as per uro. CT scan showed new mild right hydronephrosis, possible hemorrhagic material seen right renal collecting system and hematoma in posterior bladder. US  limited pelvic as per uro. May need cystoscopy w/ clot evacuation, possible fulguration as per uro. Uro following and recs apprec. Oxy, morphine  prn for pain    Hx of CAD: s/p of stent. No CP. Holding home dose of effient . Continue on statin   HTN: continue on home dose of cardizem , metoprolol . Holding home chlorthalidone    HLD: continue on statin   CKDIIIb: Cr is trending up from day prior. Avoid nephrotoxic meds   Anxiety: severity unknown. Xanax  prn    Chronic pain syndrome: continue on home dose of oxycodone     Overweight: BMI 29.9. Would benefit from weight loss        DVT prophylaxis: SCDs Code Status: full  Family Communication: discussed pt's care w/ pt's family at bedside and answered their questions Disposition Plan: likely d/c back home  Status is: Inpatient Remains inpatient appropriate because: hematuria    Level of care: Telemetry Medical Consultants:  Uro   Procedures:   Antimicrobials:    Subjective: Pt c/o pain  Objective: Vitals:   08/27/23 0239 08/27/23 0402 08/27/23 0653 08/27/23 0753  BP: (!) 188/107 (!) 176/89 (!) 175/94 (!) 175/100  Pulse: (!) 108 (!) 109 (!) 108 (!) 108  Resp: 16 16 16 16   Temp: 97.7 F (36.5 C) 97.8 F (36.6 C)  98.2 F (36.8 C)  TempSrc: Oral Oral  Oral  SpO2: 98% 93% 96% 96%  Weight:      Height:        Intake/Output  Summary (Last 24 hours) at 08/27/2023 1011 Last data filed at 08/27/2023 0729 Gross per 24 hour  Intake --  Output 8800 ml  Net -8800 ml   Filed Weights   08/26/23 1636  Weight: 92 kg    Examination:  General exam: Appears calm and comfortable  Respiratory system: Clear to auscultation. Respiratory effort normal. Cardiovascular system: S1 & S2+. No rubs, gallops or clicks Gastrointestinal system: Abdomen is nondistended, soft and nontender. Normal bowel sounds heard. Central nervous system: Alert and oriented. Moves all extremities  Psychiatry: Judgement and insight appear normal. Flat mood and affect     Data Reviewed: I have personally reviewed following labs and imaging studies  CBC: Recent Labs  Lab 08/26/23 1639 08/27/23 0348  WBC 7.7 10.6*  NEUTROABS 5.1  --   HGB 14.2 15.4  HCT 45.0 47.4  MCV 83.2 80.7  PLT 237 210   Basic Metabolic Panel: Recent Labs  Lab 08/26/23 1639 08/27/23 0348  NA 138 137  K 4.7 4.6  CL 104 103  CO2 24 21*  GLUCOSE 281* 252*  BUN 28* 33*  CREATININE 1.79* 2.69*  CALCIUM  9.5 9.1   GFR: Estimated Creatinine Clearance: 29.4 mL/min (A) (by C-G formula based on SCr of 2.69 mg/dL (H)). Liver Function Tests: Recent Labs  Lab 08/26/23 1639  AST 50*  ALT 55*  ALKPHOS 92  BILITOT 0.6  PROT  7.4  ALBUMIN  3.7   No results for input(s): LIPASE, AMYLASE in the last 168 hours. No results for input(s): AMMONIA in the last 168 hours. Coagulation Profile: Recent Labs  Lab 08/26/23 1639  INR 1.0   Cardiac Enzymes: No results for input(s): CKTOTAL, CKMB, CKMBINDEX, TROPONINI in the last 168 hours. BNP (last 3 results) No results for input(s): PROBNP in the last 8760 hours. HbA1C: No results for input(s): HGBA1C in the last 72 hours. CBG: Recent Labs  Lab 08/27/23 0200 08/27/23 0242 08/27/23 0752  GLUCAP 201* 229* 277*   Lipid Profile: No results for input(s): CHOL, HDL, LDLCALC, TRIG, CHOLHDL,  LDLDIRECT in the last 72 hours. Thyroid  Function Tests: No results for input(s): TSH, T4TOTAL, FREET4, T3FREE, THYROIDAB in the last 72 hours. Anemia Panel: No results for input(s): VITAMINB12, FOLATE, FERRITIN, TIBC, IRON, RETICCTPCT in the last 72 hours. Sepsis Labs: No results for input(s): PROCALCITON, LATICACIDVEN in the last 168 hours.  No results found for this or any previous visit (from the past 240 hours).       Radiology Studies: CT ABDOMEN PELVIS W CONTRAST Result Date: 08/26/2023 CLINICAL DATA:  Fredericka lower abdominal pain history of nephrectomy EXAM: CT ABDOMEN AND PELVIS WITH CONTRAST TECHNIQUE: Multidetector CT imaging of the abdomen and pelvis was performed using the standard protocol following bolus administration of intravenous contrast. RADIATION DOSE REDUCTION: This exam was performed according to the departmental dose-optimization program which includes automated exposure control, adjustment of the mA and/or kV according to patient size and/or use of iterative reconstruction technique. CONTRAST:  75mL OMNIPAQUE  IOHEXOL  300 MG/ML  SOLN COMPARISON:  CT 11/10/2020, 07/23/2023, 07/23/2022 FINDINGS: Lower chest: Mild scarring at the left lung base. No acute airspace disease. Hepatobiliary: Hepatic steatosis. Cholecystectomy. No biliary dilatation Pancreas: Unremarkable. No pancreatic ductal dilatation or surrounding inflammatory changes. Spleen: Normal in size without focal abnormality. Adrenals/Urinary Tract: Normal right adrenal gland. Nonvisualized left adrenal gland. Status post left nephrectomy. New mild right hydronephrosis. Hyperdense material in the right renal collecting system. No definite calcified stone along the course of the ureter. No significant excretion of contrast on delayed images. Vague hyperdense masslike area in the posterior bladder, series 2, image 80. Stomach/Bowel: Stomach nonenlarged. No dilated small bowel. No acute bowel wall  thickening. Vascular/Lymphatic: Aortic atherosclerosis. No enlarged abdominal or pelvic lymph nodes. Reproductive: Negative prostate Other: Negative for pelvic effusion or free air. Subcutaneous infiltration within the anterior abdominal wall as before. Musculoskeletal: No acute or suspicious osseous abnormality IMPRESSION: 1. Status post left nephrectomy. New mild right hydronephrosis. Hyperdense material in the right renal collecting system, suspect for hemorrhagic material. No definite calcified stone along the course of the ureter. Vague hyperdense masslike area in the posterior bladder, probably represents hemorrhagic material/hematoma, short-term follow-up ultrasound could be obtained to ensure resolution. Poor excretion of contrast from right kidney on delayed views suggesting decreased renal function. 2. Hepatic steatosis. 3. Aortic atherosclerosis. Aortic Atherosclerosis (ICD10-I70.0). Electronically Signed   By: Luke Bun M.D.   On: 08/26/2023 20:51        Scheduled Meds:  diltiazem   240 mg Oral Daily   gabapentin   800 mg Oral BID   insulin  aspart  0-5 Units Subcutaneous QHS   insulin  aspart  0-9 Units Subcutaneous TID WC   insulin  regular human CONCENTRATED  50 Units Subcutaneous TID WC   latanoprost   1 drop Both Eyes QHS   metoprolol  succinate  100 mg Oral BID WC   rosuvastatin   40 mg Oral QHS   Continuous  Infusions:  sodium chloride  irrigation       LOS: 0 days     Anthony CHRISTELLA Pouch, MD [facility] Pager [phone]  If 7PM-7AM, please contact night-coverage [email or web address] 08/27/2023, 10:11 AM

## 2023-08-27 NOTE — TOC CM/SW Note (Signed)
 Transition of Care [facility]) - Inpatient Brief Assessment   Patient Details  Name: RAYMOUND KATICH MRN: [id number] Date of Birth: 03-Jul-1955  Transition of Care [facility]) CM/SW Contact:    Corean ONEIDA Haddock, RN Phone Number: 08/27/2023, 2:30 PM   Clinical Narrative:   Transition of Care [facility]) Screening Note   Patient Details  Name: RAFE MACKOWSKI Date of Birth: 02-10-1956   Transition of Care [facility]) CM/SW Contact:    Corean ONEIDA Haddock, RN Phone Number: 08/27/2023, 2:30 PM    Transition of Care Department [facility]) has reviewed patient and no TOC needs have been identified at this time.  If new patient transition needs arise, please place a TOC consult.    Transition of Care Asessment: Insurance and Status: Insurance coverage has been reviewed Patient has primary care physician: Yes     Prior/Current Home Services: No current home services Social Drivers of Health Review: SDOH reviewed no interventions necessary Readmission risk has been reviewed: Yes Transition of care needs: no transition of care needs at this time

## 2023-08-27 NOTE — Plan of Care (Signed)

## 2023-08-28 ENCOUNTER — Inpatient Hospital Stay: Admitting: Certified Registered"

## 2023-08-28 ENCOUNTER — Inpatient Hospital Stay

## 2023-08-28 ENCOUNTER — Encounter
Admission: EM | Disposition: A | Discharge status: Home Health | Payer: Self-pay | Source: Home / Self Care | Attending: Family Medicine

## 2023-08-28 DIAGNOSIS — R310 Gross hematuria: Secondary | ICD-10-CM | POA: Diagnosis not present

## 2023-08-28 DIAGNOSIS — N189 Chronic kidney disease, unspecified: Secondary | ICD-10-CM | POA: Diagnosis not present

## 2023-08-28 DIAGNOSIS — E8721 Acute metabolic acidosis: Secondary | ICD-10-CM

## 2023-08-28 DIAGNOSIS — J9600 Acute respiratory failure, unspecified whether with hypoxia or hypercapnia: Secondary | ICD-10-CM | POA: Diagnosis not present

## 2023-08-28 DIAGNOSIS — Z905 Acquired absence of kidney: Secondary | ICD-10-CM

## 2023-08-28 DIAGNOSIS — N179 Acute kidney failure, unspecified: Secondary | ICD-10-CM | POA: Diagnosis not present

## 2023-08-28 DIAGNOSIS — N1831 Chronic kidney disease, stage 3a: Secondary | ICD-10-CM | POA: Diagnosis not present

## 2023-08-28 DIAGNOSIS — R001 Bradycardia, unspecified: Secondary | ICD-10-CM | POA: Diagnosis not present

## 2023-08-28 DIAGNOSIS — R319 Hematuria, unspecified: Secondary | ICD-10-CM | POA: Diagnosis not present

## 2023-08-28 DIAGNOSIS — D62 Acute posthemorrhagic anemia: Secondary | ICD-10-CM

## 2023-08-28 DIAGNOSIS — G929 Unspecified toxic encephalopathy: Secondary | ICD-10-CM

## 2023-08-28 DIAGNOSIS — D72829 Elevated white blood cell count, unspecified: Secondary | ICD-10-CM

## 2023-08-28 HISTORY — PX: CYSTOSCOPY W/ URETERAL STENT PLACEMENT: SHX1429

## 2023-08-28 LAB — COMPREHENSIVE METABOLIC PANEL WITH GFR
ALT: 30 U/L (ref 0–44)
AST: 16 U/L (ref 15–41)
Albumin: 3 g/dL — ABNORMAL LOW (ref 3.5–5.0)
Alkaline Phosphatase: 77 U/L (ref 38–126)
Anion gap: 14 (ref 5–15)
BUN: 56 mg/dL — ABNORMAL HIGH (ref 8–23)
CO2: 19 mmol/L — ABNORMAL LOW (ref 22–32)
Calcium: 8.5 mg/dL — ABNORMAL LOW (ref 8.9–10.3)
Chloride: 99 mmol/L (ref 98–111)
Creatinine, Ser: 7.49 mg/dL — ABNORMAL HIGH (ref 0.61–1.24)
GFR, Estimated: 7 mL/min — ABNORMAL LOW (ref >60)
Glucose, Bld: 160 mg/dL — ABNORMAL HIGH (ref 70–99)
Potassium: 5.7 mmol/L — ABNORMAL HIGH (ref 3.5–5.1)
Sodium: 132 mmol/L — ABNORMAL LOW (ref 135–145)
Total Bilirubin: 0.6 mg/dL (ref 0.0–1.2)
Total Protein: 6.5 g/dL (ref 6.5–8.1)

## 2023-08-28 LAB — BASIC METABOLIC PANEL WITH GFR
Anion gap: 12 (ref 5–15)
Anion gap: 15 (ref 5–15)
Anion gap: 13 (ref 5–15)
BUN: 52 mg/dL — ABNORMAL HIGH (ref 8–23)
BUN: 58 mg/dL — ABNORMAL HIGH (ref 8–23)
BUN: 61 mg/dL — ABNORMAL HIGH (ref 8–23)
CO2: 21 mmol/L — ABNORMAL LOW (ref 22–32)
CO2: 19 mmol/L — ABNORMAL LOW (ref 22–32)
CO2: 25 mmol/L (ref 22–32)
Calcium: 8.8 mg/dL — ABNORMAL LOW (ref 8.9–10.3)
Calcium: 8.2 mg/dL — ABNORMAL LOW (ref 8.9–10.3)
Calcium: 8.5 mg/dL — ABNORMAL LOW (ref 8.9–10.3)
Chloride: 99 mmol/L (ref 98–111)
Chloride: 100 mmol/L (ref 98–111)
Chloride: 99 mmol/L (ref 98–111)
Creatinine, Ser: 6.63 mg/dL — ABNORMAL HIGH (ref 0.61–1.24)
Creatinine, Ser: 7.55 mg/dL — ABNORMAL HIGH (ref 0.61–1.24)
Creatinine, Ser: 7.84 mg/dL — ABNORMAL HIGH (ref 0.61–1.24)
GFR, Estimated: 8 mL/min — ABNORMAL LOW (ref >60)
GFR, Estimated: 7 mL/min — ABNORMAL LOW (ref >60)
GFR, Estimated: 7 mL/min — ABNORMAL LOW (ref >60)
Glucose, Bld: 228 mg/dL — ABNORMAL HIGH (ref 70–99)
Glucose, Bld: 280 mg/dL — ABNORMAL HIGH (ref 70–99)
Glucose, Bld: 267 mg/dL — ABNORMAL HIGH (ref 70–99)
Potassium: 5.5 mmol/L — ABNORMAL HIGH (ref 3.5–5.1)
Potassium: 6 mmol/L — ABNORMAL HIGH (ref 3.5–5.1)
Potassium: 5.2 mmol/L — ABNORMAL HIGH (ref 3.5–5.1)
Sodium: 132 mmol/L — ABNORMAL LOW (ref 135–145)
Sodium: 134 mmol/L — ABNORMAL LOW (ref 135–145)
Sodium: 137 mmol/L (ref 135–145)

## 2023-08-28 LAB — BLOOD GAS, VENOUS
Acid-base deficit: 1.8 mmol/L (ref 0.0–2.0)
Bicarbonate: 25.8 mmol/L (ref 20.0–28.0)
O2 Saturation: 67.9 %
Patient temperature: 37
pCO2, Ven: 55 mmHg (ref 44–60)
pH, Ven: 7.28 (ref 7.25–7.43)
pO2, Ven: 39 mmHg (ref 32–45)

## 2023-08-28 LAB — CBC WITH DIFFERENTIAL/PLATELET
Abs Immature Granulocytes: 0.1 K/uL — ABNORMAL HIGH (ref 0.00–0.07)
Basophils Absolute: 0.1 K/uL (ref 0.0–0.1)
Basophils Relative: 0 %
Eosinophils Absolute: 0.1 K/uL (ref 0.0–0.5)
Eosinophils Relative: 1 %
HCT: 39.9 % (ref 39.0–52.0)
Hemoglobin: 12.9 g/dL — ABNORMAL LOW (ref 13.0–17.0)
Immature Granulocytes: 1 %
Lymphocytes Relative: 6 %
Lymphs Abs: 0.8 K/uL (ref 0.7–4.0)
MCH: 26.1 pg (ref 26.0–34.0)
MCHC: 32.3 g/dL (ref 30.0–36.0)
MCV: 80.8 fL (ref 80.0–100.0)
Monocytes Absolute: 1 K/uL (ref 0.1–1.0)
Monocytes Relative: 7 %
Neutro Abs: 11.5 K/uL — ABNORMAL HIGH (ref 1.7–7.7)
Neutrophils Relative %: 85 %
Platelets: 165 K/uL (ref 150–400)
RBC: 4.94 MIL/uL (ref 4.22–5.81)
RDW: 15.7 % — ABNORMAL HIGH (ref 11.5–15.5)
WBC: 13.4 K/uL — ABNORMAL HIGH (ref 4.0–10.5)
nRBC: 0 % (ref 0.0–0.2)

## 2023-08-28 LAB — CBC
HCT: 44.2 % (ref 39.0–52.0)
Hemoglobin: 14.5 g/dL (ref 13.0–17.0)
MCH: 26.3 pg (ref 26.0–34.0)
MCHC: 32.8 g/dL (ref 30.0–36.0)
MCV: 80.1 fL (ref 80.0–100.0)
Platelets: 211 K/uL (ref 150–400)
RBC: 5.52 MIL/uL (ref 4.22–5.81)
RDW: 15.6 % — ABNORMAL HIGH (ref 11.5–15.5)
WBC: 15.3 K/uL — ABNORMAL HIGH (ref 4.0–10.5)
nRBC: 0 % (ref 0.0–0.2)

## 2023-08-28 LAB — BLOOD GAS, ARTERIAL
Acid-base deficit: 6.1 mmol/L — ABNORMAL HIGH (ref 0.0–2.0)
Acid-base deficit: 4.8 mmol/L — ABNORMAL HIGH (ref 0.0–2.0)
Bicarbonate: 21.1 mmol/L (ref 20.0–28.0)
Bicarbonate: 21.6 mmol/L (ref 20.0–28.0)
Delivery systems: POSITIVE
Expiratory PAP: 5 cmH2O
FIO2: 30 %
Inspiratory PAP: 10 cmH2O
O2 Saturation: 89.6 %
O2 Saturation: 95.1 %
Patient temperature: 37
Patient temperature: 37
pCO2 arterial: 47 mmHg (ref 32–48)
pCO2 arterial: 44 mmHg (ref 32–48)
pH, Arterial: 7.26 — ABNORMAL LOW (ref 7.35–7.45)
pH, Arterial: 7.3 — ABNORMAL LOW (ref 7.35–7.45)
pO2, Arterial: 60 mmHg — ABNORMAL LOW (ref 83–108)
pO2, Arterial: 70 mmHg — ABNORMAL LOW (ref 83–108)

## 2023-08-28 LAB — GLUCOSE, CAPILLARY
Glucose-Capillary: 164 mg/dL — ABNORMAL HIGH (ref 70–99)
Glucose-Capillary: 174 mg/dL — ABNORMAL HIGH (ref 70–99)
Glucose-Capillary: 207 mg/dL — ABNORMAL HIGH (ref 70–99)
Glucose-Capillary: 225 mg/dL — ABNORMAL HIGH (ref 70–99)
Glucose-Capillary: 232 mg/dL — ABNORMAL HIGH (ref 70–99)
Glucose-Capillary: 244 mg/dL — ABNORMAL HIGH (ref 70–99)
Glucose-Capillary: 262 mg/dL — ABNORMAL HIGH (ref 70–99)
Glucose-Capillary: 291 mg/dL — ABNORMAL HIGH (ref 70–99)

## 2023-08-28 LAB — TROPONIN I (HIGH SENSITIVITY): Troponin I (High Sensitivity): 29 ng/L — ABNORMAL HIGH (ref <18)

## 2023-08-28 LAB — LACTIC ACID, PLASMA
Lactic Acid, Venous: 0.9 mmol/L (ref 0.5–1.9)
Lactic Acid, Venous: 1.2 mmol/L (ref 0.5–1.9)

## 2023-08-28 LAB — HEMOGLOBIN AND HEMATOCRIT, BLOOD
HCT: 40.2 % (ref 39.0–52.0)
Hemoglobin: 13.4 g/dL (ref 13.0–17.0)

## 2023-08-28 SURGERY — CYSTOSCOPY, WITH RETROGRADE PYELOGRAM AND URETERAL STENT INSERTION
Anesthesia: General | Laterality: Right

## 2023-08-28 MED ORDER — OXYCODONE HCL 5 MG PO TABS: 5.0000 mg | ORAL_TABLET | Freq: Once | ORAL | Status: DC | PRN

## 2023-08-28 MED ORDER — INSULIN ASPART 100 UNIT/ML IV SOLN
10.0000 [IU] | Freq: Once | INTRAVENOUS | Status: AC
  Administered 2023-08-28: 10 [IU] via INTRAVENOUS
  Filled 2023-08-28: qty 0.1

## 2023-08-28 MED ORDER — PHENYLEPHRINE 80 MCG/ML (10ML) SYRINGE FOR IV PUSH (FOR BLOOD PRESSURE SUPPORT)
PREFILLED_SYRINGE | INTRAVENOUS | Status: DC | PRN
  Administered 2023-08-28 (×2): 160 ug via INTRAVENOUS
  Administered 2023-08-28: 80 ug via INTRAVENOUS

## 2023-08-28 MED ORDER — MIDAZOLAM HCL 2 MG/2ML IJ SOLN
INTRAMUSCULAR | Status: AC
  Filled 2023-08-28: qty 2

## 2023-08-28 MED ORDER — PATIROMER SORBITEX CALCIUM 8.4 G PO PACK
25.2000 g | PACK | Freq: Every day | ORAL | Status: DC
  Filled 2023-08-28 (×2): qty 3

## 2023-08-28 MED ORDER — OXYCODONE HCL 5 MG/5ML PO SOLN: 5.0000 mg | Freq: Once | ORAL | Status: DC | PRN

## 2023-08-28 MED ORDER — FENTANYL CITRATE PF 50 MCG/ML IJ SOSY
PREFILLED_SYRINGE | INTRAMUSCULAR | Status: AC
  Administered 2023-08-28: 25 ug via INTRAVENOUS
  Filled 2023-08-28: qty 1

## 2023-08-28 MED ORDER — PRISMASOL BGK 2/3.5 32-2-3.5 MEQ/L EC SOLN: Status: DC

## 2023-08-28 MED ORDER — FENTANYL CITRATE (PF) 100 MCG/2ML IJ SOLN
INTRAMUSCULAR | Status: DC | PRN
  Administered 2023-08-28: 25 ug via INTRAVENOUS

## 2023-08-28 MED ORDER — SUGAMMADEX SODIUM 200 MG/2ML IV SOLN
INTRAVENOUS | Status: DC | PRN
Start: 2023-08-28 — End: 2023-08-28
  Administered 2023-08-28: 300 mg via INTRAVENOUS

## 2023-08-28 MED ORDER — PROPOFOL 10 MG/ML IV BOLUS
INTRAVENOUS | Status: AC
  Filled 2023-08-28: qty 20

## 2023-08-28 MED ORDER — ACETAMINOPHEN 10 MG/ML IV SOLN
1000.0000 mg | Freq: Once | INTRAVENOUS | Status: AC
  Administered 2023-08-28: 1000 mg via INTRAVENOUS

## 2023-08-28 MED ORDER — FENTANYL CITRATE (PF) 100 MCG/2ML IJ SOLN: 25.0000 ug | INTRAMUSCULAR | Status: DC | PRN

## 2023-08-28 MED ORDER — ACETAMINOPHEN 10 MG/ML IV SOLN
INTRAVENOUS | Status: AC
  Filled 2023-08-28: qty 100

## 2023-08-28 MED ORDER — IOHEXOL 180 MG/ML  SOLN
INTRAMUSCULAR | Status: DC | PRN
  Administered 2023-08-28: 20 mL

## 2023-08-28 MED ORDER — INSULIN ASPART 100 UNIT/ML IJ SOLN
0.0000 [IU] | INTRAMUSCULAR | Status: DC
  Administered 2023-08-28: 3 [IU] via SUBCUTANEOUS
  Administered 2023-08-28: 5 [IU] via SUBCUTANEOUS
  Administered 2023-08-29: 3 [IU] via SUBCUTANEOUS
  Administered 2023-08-29: 2 [IU] via SUBCUTANEOUS
  Administered 2023-08-29 (×2): 5 [IU] via SUBCUTANEOUS
  Administered 2023-08-29: 2 [IU] via SUBCUTANEOUS
  Administered 2023-08-29: 3 [IU] via SUBCUTANEOUS
  Administered 2023-08-30: 5 [IU] via SUBCUTANEOUS
  Filled 2023-08-28: qty 5
  Filled 2023-08-28: qty 2
  Filled 2023-08-28 (×2): qty 3
  Filled 2023-08-28: qty 5
  Filled 2023-08-28: qty 3
  Filled 2023-08-28: qty 5
  Filled 2023-08-28: qty 2
  Filled 2023-08-28: qty 5

## 2023-08-28 MED ORDER — SODIUM CHLORIDE 0.9 % IV SOLN: INTRAVENOUS | Status: DC

## 2023-08-28 MED ORDER — FUROSEMIDE 10 MG/ML IJ SOLN: 40.0000 mg | Freq: Once | INTRAMUSCULAR | Status: DC

## 2023-08-28 MED ORDER — DEXTROSE 50 % IV SOLN
1.0000 | Freq: Once | INTRAVENOUS | Status: AC
  Administered 2023-08-28: 50 mL via INTRAVENOUS
  Filled 2023-08-28: qty 50

## 2023-08-28 MED ORDER — CALCIUM GLUCONATE-NACL 1-0.675 GM/50ML-% IV SOLN
1.0000 g | Freq: Once | INTRAVENOUS | Status: AC
  Administered 2023-08-28: 1000 mg via INTRAVENOUS
  Filled 2023-08-28: qty 50

## 2023-08-28 MED ORDER — PROPOFOL 10 MG/ML IV BOLUS
INTRAVENOUS | Status: DC | PRN
  Administered 2023-08-28: 200 mg via INTRAVENOUS

## 2023-08-28 MED ORDER — SODIUM BICARBONATE 8.4 % IV SOLN
100.0000 meq | Freq: Once | INTRAVENOUS | Status: AC
  Administered 2023-08-28: 100 meq via INTRAVENOUS
  Filled 2023-08-28: qty 100

## 2023-08-28 MED ORDER — DEXTROSE 50 % IV SOLN
50.0000 mL | Freq: Once | INTRAVENOUS | Status: AC
  Administered 2023-08-28: 50 mL via INTRAVENOUS
  Filled 2023-08-28: qty 50

## 2023-08-28 MED ORDER — FENTANYL CITRATE PF 50 MCG/ML IJ SOSY: 25.0000 ug | PREFILLED_SYRINGE | Freq: Once | INTRAMUSCULAR | Status: AC

## 2023-08-28 MED ORDER — INSULIN ASPART 100 UNIT/ML IJ SOLN
5.0000 [IU] | Freq: Once | INTRAMUSCULAR | Status: AC
  Administered 2023-08-28: 5 [IU] via SUBCUTANEOUS

## 2023-08-28 MED ORDER — ROCURONIUM BROMIDE 100 MG/10ML IV SOLN
INTRAVENOUS | Status: DC | PRN
  Administered 2023-08-28: 90 mg via INTRAVENOUS

## 2023-08-28 MED ORDER — SODIUM ZIRCONIUM CYCLOSILICATE 10 G PO PACK
10.0000 g | PACK | Freq: Once | ORAL | Status: AC
  Administered 2023-08-28: 10 g via ORAL
  Filled 2023-08-28: qty 1

## 2023-08-28 MED ORDER — INSULIN ASPART 100 UNIT/ML IJ SOLN
INTRAMUSCULAR | Status: AC
  Filled 2023-08-28: qty 1

## 2023-08-28 MED ORDER — LIDOCAINE HCL (CARDIAC) PF 100 MG/5ML IV SOSY
PREFILLED_SYRINGE | INTRAVENOUS | Status: DC | PRN
  Administered 2023-08-28: 100 mg via INTRAVENOUS

## 2023-08-28 MED ORDER — DEXAMETHASONE SODIUM PHOSPHATE 10 MG/ML IJ SOLN
INTRAMUSCULAR | Status: DC | PRN
  Administered 2023-08-28: 4 mg via INTRAVENOUS

## 2023-08-28 MED ORDER — INSULIN ASPART 100 UNIT/ML IV SOLN
10.0000 [IU] | INTRAVENOUS | Status: AC
  Administered 2023-08-28: 10 [IU] via INTRAVENOUS
  Filled 2023-08-28: qty 0.1

## 2023-08-28 MED ORDER — SODIUM CHLORIDE 0.9 % IR SOLN
3000.0000 mL | Status: DC
  Administered 2023-08-28 – 2023-08-29 (×2): 3000 mL

## 2023-08-28 MED ORDER — SODIUM CHLORIDE 0.9 % FOR CRRT: INTRAVENOUS_CENTRAL | Status: DC | PRN

## 2023-08-28 MED ORDER — SODIUM CHLORIDE 0.9 % IR SOLN
Status: DC | PRN
  Administered 2023-08-28: 6000 mL

## 2023-08-28 MED ORDER — FENTANYL CITRATE (PF) 100 MCG/2ML IJ SOLN
INTRAMUSCULAR | Status: AC
  Filled 2023-08-28: qty 2

## 2023-08-28 MED ORDER — PHENYLEPHRINE HCL-NACL 20-0.9 MG/250ML-% IV SOLN
INTRAVENOUS | Status: AC
  Filled 2023-08-28: qty 250

## 2023-08-28 MED ORDER — SODIUM CHLORIDE 0.9 % IV SOLN
1.0000 g | INTRAVENOUS | Status: DC
  Administered 2023-08-28 – 2023-08-31 (×4): 1 g via INTRAVENOUS
  Filled 2023-08-28 (×4): qty 10

## 2023-08-28 MED ORDER — HEPARIN SODIUM (PORCINE) 1000 UNIT/ML DIALYSIS
1000.0000 [IU] | INTRAMUSCULAR | Status: DC | PRN
  Administered 2023-08-30: 3000 [IU] via INTRAVENOUS_CENTRAL
  Filled 2023-08-28: qty 4

## 2023-08-28 MED ORDER — IPRATROPIUM-ALBUTEROL 0.5-2.5 (3) MG/3ML IN SOLN
3.0000 mL | Freq: Once | RESPIRATORY_TRACT | Status: AC
  Administered 2023-08-28: 3 mL via RESPIRATORY_TRACT

## 2023-08-28 MED ORDER — ONDANSETRON HCL 4 MG/2ML IJ SOLN
INTRAMUSCULAR | Status: DC | PRN
  Administered 2023-08-28: 4 mg via INTRAVENOUS

## 2023-08-28 MED ORDER — SODIUM CHLORIDE 0.9 % IV SOLN: INTRAVENOUS | Status: DC | PRN

## 2023-08-28 MED ORDER — IPRATROPIUM-ALBUTEROL 0.5-2.5 (3) MG/3ML IN SOLN
RESPIRATORY_TRACT | Status: AC
  Filled 2023-08-28: qty 3

## 2023-08-28 SURGICAL SUPPLY — 28 items
BAG DRAIN SIEMENS DORNER NS (MISCELLANEOUS) ×2 IMPLANT
BAG URO DRAIN 4000ML (MISCELLANEOUS) ×1 IMPLANT
BRUSH SCRUB EZ 1% IODOPHOR (MISCELLANEOUS) ×2 IMPLANT
BRUSH SCRUB EZ 4% CHG (MISCELLANEOUS) ×2 IMPLANT
CATH FOL 3WAY LX 20X30 (CATHETERS) ×1 IMPLANT
CATH URETL OPEN END 6X70 (CATHETERS) ×2 IMPLANT
DRSG TELFA 3X4 N-ADH STERILE (GAUZE/BANDAGES/DRESSINGS) ×1 IMPLANT
ELECTRODE REM PT RTRN 9FT ADLT (ELECTROSURGICAL) ×1 IMPLANT
GLOVE BIOGEL PI IND STRL 7.5 (GLOVE) ×2 IMPLANT
GOWN STRL REUS W/ TWL LRG LVL3 (GOWN DISPOSABLE) ×4 IMPLANT
GOWN STRL REUS W/ TWL XL LVL3 (GOWN DISPOSABLE) ×2 IMPLANT
GUIDEWIRE STR DUAL SENSOR (WIRE) ×2 IMPLANT
HOLDER FOLEY CATH W/STRAP (MISCELLANEOUS) ×1 IMPLANT
KIT TURNOVER CYSTO (KITS) ×2 IMPLANT
NDL SAFETY ECLIPSE 18X1.5 (NEEDLE) ×1 IMPLANT
PACK CYSTO AR (MISCELLANEOUS) ×2 IMPLANT
PLUG CATH AND CAP STRL 200 (CATHETERS) ×1 IMPLANT
SET CYSTO W/LG BORE CLAMP LF (SET/KITS/TRAYS/PACK) ×1 IMPLANT
SET IRRIG Y TYPE TUR BLADDER L (SET/KITS/TRAYS/PACK) ×1 IMPLANT
SOL .9 NS 3000ML IRR UROMATIC (IV SOLUTION) ×3 IMPLANT
STENT URET 6FRX24 CONTOUR (STENTS) IMPLANT
STENT URET 6FRX26 CONTOUR (STENTS) ×1 IMPLANT
SURGILUBE 2OZ TUBE FLIPTOP (MISCELLANEOUS) ×2 IMPLANT
SYR 10ML LL (SYRINGE) ×2 IMPLANT
SYRINGE TOOMEY IRRIG 70ML (MISCELLANEOUS) ×1 IMPLANT
WATER STERILE IRR 1000ML POUR (IV SOLUTION) ×1 IMPLANT
WATER STERILE IRR 3000ML UROMA (IV SOLUTION) ×1 IMPLANT
WATER STERILE IRR 500ML POUR (IV SOLUTION) ×2 IMPLANT

## 2023-08-28 NOTE — Progress Notes (Signed)
 PROGRESS NOTE    Kenneth Osborn  FMW:[id number] DOB: [birth year] DOA: 08/26/2023 PCP: Valora Agent, MD    Assessment & Plan:   Principal Problem:   Hematuria Active Problems:   CAD (coronary artery disease)   HTN (hypertension)   HLD (hyperlipidemia)   Chronic kidney disease, stage 3b (HCC)   Anxiety   Chronic pain syndrome   Overweight (BMI 25.0-29.9)  Assessment and Plan: Hematuria: possibly secondary to organized clot as per uro. CT scan showed new mild right hydronephrosis, possible hemorrhagic material seen right renal collecting system and hematoma in posterior bladder. Will go cystoscopy w/ right ureteral stent placement, left retrograde pyelogram, possible clot evacuation/fulguration/biopsy today as per uro. Oxy, morphine  prn for pain   AKI on CKDIIIb: likely AKI secondary to above. Started on IVFs, IV rocephin , blood cxs, urine cx. Will consult nephro in AM if Cr is not trending down tomorrow  Hyperkalemia: likely secondary to AKI on CKD. Lokelma  x 1 & Ca gluconate ordered. Repeat potassium level ordered    Hx of CAD: s/p of stent. No CP. Continue on statin. Holding home dose of effient     HTN: continue on home dose of metoprolol , cardizem . Holding home chlorthalidone    HLD: continue on statin    Anxiety: severity unknown. Xanax  prn    Chronic pain syndrome: continue on home dose of oxycodone      Overweight: BMI 29.9. Would benefit from weight loss        DVT prophylaxis: SCDs Code Status: full  Family Communication: discussed pt's care w/ pt's family at bedside and answered their questions Disposition Plan: likely d/c back home  Status is: Inpatient Remains inpatient appropriate because: hematuria    Level of care: Telemetry Medical Consultants:  Uro   Procedures:   Antimicrobials: rocephin    Subjective: Pt c/o suprapubic pain   Objective: Vitals:   08/27/23 2027 08/27/23 2311 08/28/23 0333 08/28/23 0751  BP: (!) 162/88 (!) 158/73 (!)  145/78 (!) 150/76  Pulse: (!) 112 (!) 115 (!) 112 (!) 108  Resp:    20  Temp: 100.2 F (37.9 C) (!) 101.2 F (38.4 C) (!) 100.5 F (38.1 C) 99.5 F (37.5 C)  TempSrc: Oral Axillary Axillary Oral  SpO2: 90% 90% 90% 92%  Weight:      Height:        Intake/Output Summary (Last 24 hours) at 08/28/2023 0900 Last data filed at 08/28/2023 0800 Gross per 24 hour  Intake 5720 ml  Output 6300 ml  Net -580 ml   Filed Weights   08/26/23 1636  Weight: 92 kg    Examination:  General exam: appears uncomfortable  Respiratory system: decreased breath sounds b/l  Cardiovascular system: S1/S2+. No rubs or clicks  Gastrointestinal system: abd is soft, obese, hypoactive bowel sounds  Central nervous system: lethargic. Moves all extremities  Psychiatry: judgement and insight appears not at baseline. Flat mood and affect     Data Reviewed: I have personally reviewed following labs and imaging studies  CBC: Recent Labs  Lab 08/26/23 1639 08/27/23 0348 08/28/23 0505  WBC 7.7 10.6* 15.3*  NEUTROABS 5.1  --   --   HGB 14.2 15.4 14.5  HCT 45.0 47.4 44.2  MCV 83.2 80.7 80.1  PLT 237 210 211   Basic Metabolic Panel: Recent Labs  Lab 08/26/23 1639 08/27/23 0348 08/28/23 0505  NA 138 137 132*  K 4.7 4.6 5.5*  CL 104 103 99  CO2 24 21* 21*  GLUCOSE 281* 252* 228*  BUN 28* 33* 52*  CREATININE 1.79* 2.69* 6.63*  CALCIUM  9.5 9.1 8.8*   GFR: Estimated Creatinine Clearance: 11.9 mL/min (A) (by C-G formula based on SCr of 6.63 mg/dL (H)). Liver Function Tests: Recent Labs  Lab 08/26/23 1639  AST 50*  ALT 55*  ALKPHOS 92  BILITOT 0.6  PROT 7.4  ALBUMIN  3.7   No results for input(s): LIPASE, AMYLASE in the last 168 hours. No results for input(s): AMMONIA in the last 168 hours. Coagulation Profile: Recent Labs  Lab 08/26/23 1639  INR 1.0   Cardiac Enzymes: No results for input(s): CKTOTAL, CKMB, CKMBINDEX, TROPONINI in the last 168 hours. BNP (last 3  results) No results for input(s): PROBNP in the last 8760 hours. HbA1C: No results for input(s): HGBA1C in the last 72 hours. CBG: Recent Labs  Lab 08/27/23 0242 08/27/23 0752 08/27/23 1233 08/27/23 2047 08/28/23 0739  GLUCAP 229* 277* 265* 241* 232*   Lipid Profile: No results for input(s): CHOL, HDL, LDLCALC, TRIG, CHOLHDL, LDLDIRECT in the last 72 hours. Thyroid  Function Tests: No results for input(s): TSH, T4TOTAL, FREET4, T3FREE, THYROIDAB in the last 72 hours. Anemia Panel: No results for input(s): VITAMINB12, FOLATE, FERRITIN, TIBC, IRON, RETICCTPCT in the last 72 hours. Sepsis Labs: No results for input(s): PROCALCITON, LATICACIDVEN in the last 168 hours.  No results found for this or any previous visit (from the past 240 hours).       Radiology Studies: US  PELVIS LIMITED (TRANSABDOMINAL ONLY) Result Date: 08/27/2023 CLINICAL DATA:  Gross hematuria EXAM: LIMITED ULTRASOUND OF PELVIS TECHNIQUE: Limited transabdominal ultrasound examination of the pelvis was performed. COMPARISON:  CT 08/26/2023 FINDINGS: Urinary bladder is decompressed by Foley catheter. Pre-void volume: None significant Other findings:  None. IMPRESSION: Urinary bladder is completely decompressed by Foley catheter. Electronically Signed   By: Luke Bun M.D.   On: 08/27/2023 19:22   CT ABDOMEN PELVIS W CONTRAST Result Date: 08/26/2023 CLINICAL DATA:  Fredericka lower abdominal pain history of nephrectomy EXAM: CT ABDOMEN AND PELVIS WITH CONTRAST TECHNIQUE: Multidetector CT imaging of the abdomen and pelvis was performed using the standard protocol following bolus administration of intravenous contrast. RADIATION DOSE REDUCTION: This exam was performed according to the departmental dose-optimization program which includes automated exposure control, adjustment of the mA and/or kV according to patient size and/or use of iterative reconstruction technique. CONTRAST:  75mL  OMNIPAQUE  IOHEXOL  300 MG/ML  SOLN COMPARISON:  CT 11/10/2020, 07/23/2023, 07/23/2022 FINDINGS: Lower chest: Mild scarring at the left lung base. No acute airspace disease. Hepatobiliary: Hepatic steatosis. Cholecystectomy. No biliary dilatation Pancreas: Unremarkable. No pancreatic ductal dilatation or surrounding inflammatory changes. Spleen: Normal in size without focal abnormality. Adrenals/Urinary Tract: Normal right adrenal gland. Nonvisualized left adrenal gland. Status post left nephrectomy. New mild right hydronephrosis. Hyperdense material in the right renal collecting system. No definite calcified stone along the course of the ureter. No significant excretion of contrast on delayed images. Vague hyperdense masslike area in the posterior bladder, series 2, image 80. Stomach/Bowel: Stomach nonenlarged. No dilated small bowel. No acute bowel wall thickening. Vascular/Lymphatic: Aortic atherosclerosis. No enlarged abdominal or pelvic lymph nodes. Reproductive: Negative prostate Other: Negative for pelvic effusion or free air. Subcutaneous infiltration within the anterior abdominal wall as before. Musculoskeletal: No acute or suspicious osseous abnormality IMPRESSION: 1. Status post left nephrectomy. New mild right hydronephrosis. Hyperdense material in the right renal collecting system, suspect for hemorrhagic material. No definite calcified stone along the course of the ureter. Vague hyperdense masslike area in the posterior bladder,  probably represents hemorrhagic material/hematoma, short-term follow-up ultrasound could be obtained to ensure resolution. Poor excretion of contrast from right kidney on delayed views suggesting decreased renal function. 2. Hepatic steatosis. 3. Aortic atherosclerosis. Aortic Atherosclerosis (ICD10-I70.0). Electronically Signed   By: Luke Bun M.D.   On: 08/26/2023 20:51        Scheduled Meds:  Chlorhexidine  Gluconate Cloth  6 each Topical Daily   diltiazem   240 mg  Oral Daily   gabapentin   800 mg Oral BID   insulin  aspart  0-5 Units Subcutaneous QHS   insulin  aspart  0-9 Units Subcutaneous TID WC   insulin  regular human CONCENTRATED  50 Units Subcutaneous TID WC   latanoprost   1 drop Both Eyes QHS   metoprolol  succinate  100 mg Oral BID WC   rosuvastatin   40 mg Oral QHS   Continuous Infusions:  cefTRIAXone  (ROCEPHIN )  IV     sodium chloride  irrigation       LOS: 1 day     Anthony CHRISTELLA Pouch, MD [facility] Pager [phone]  If 7PM-7AM, please contact night-coverage [email or web address] 08/28/2023, 9:00 AM

## 2023-08-28 NOTE — Plan of Care (Signed)

## 2023-08-28 NOTE — Addendum Note (Signed)
 Addendum  created 08/28/23 1648 by Shellie Odor, MD   Clinical Note Signed

## 2023-08-28 NOTE — Anesthesia Preprocedure Evaluation (Signed)
 Anesthesia Evaluation  Patient identified by MRN, date of birth, ID band Patient awake    Reviewed: Allergy & Precautions, NPO status , Patient's Chart, lab work & pertinent test results  History of Anesthesia Complications Negative for: history of anesthetic complications  Airway Mallampati: III  TM Distance: >3 FB Neck ROM: full    Dental no notable dental hx.    Pulmonary sleep apnea and Continuous Positive Airway Pressure Ventilation    Pulmonary exam normal        Cardiovascular hypertension, (-) angina + CAD, + Past MI and + Cardiac Stents  (-) DOE + dysrhythmias Supra Ventricular Tachycardia      Neuro/Psych  Headaches PSYCHIATRIC DISORDERS Anxiety      Neuromuscular disease    GI/Hepatic Neg liver ROS,GERD  Medicated,,  Endo/Other  negative endocrine ROSdiabetes, Well Controlled, Type 2, Insulin  Dependent, Oral Hypoglycemic Agents    Renal/GU Renal disease     Musculoskeletal   Abdominal   Peds  Hematology negative hematology ROS (+)   Anesthesia Other Findings Past Medical History: 12/03/2018: Acute postoperative pain 12/30/2012: Allergic rhinitis No date: Anemia No date: Anginal pain (HCC) No date: Anxiety No date: Bronchitis     Comment:  hx of 08/12/2014: Can't get food down 01/2020: [facility])     Comment:  kidney  No date: Chronic back pain     Comment:  thoracic area 09/2015: Concussion No date: Coronary artery disease     Comment:  99% blockage No date: DDD (degenerative disc disease), cervical No date: Dehydration symptoms     Comment:  2019 No date: Diabetes mellitus without complication (HCC)     Comment:  insulin  dependent No date: Dysphagia No date: GERD (gastroesophageal reflux disease) 12/21/2014: History of Meniere's disease 12/21/2014: History of thoracic spine surgery (S/P T9-10 IVD spacer) No date: Hypercalcemia No date: Hypercholesteremia No date: Hyperkalemia No date:  Hyperlipidemia No date: Hypertension     Comment:  sees Dr. Norleen walker [facility] No date: Meniere's disease     Comment:  deaf in right ear, takes diazepam  2012: Myocardial infarction [facility])     Comment:  Sees Dr. Jettie, [facility] No date: Neuromuscular disorder [facility])     Comment:  diabetic neuropathy in feet No date: Proteinuria No date: Renal cell carcinoma (HCC) No date: Renal disorder No date: Secondary hyperparathyroidism of renal origin (HCC) 02/07/2020: Severe sepsis (HCC) No date: Short-segment Barrett's esophagus No date: Sleep apnea     Comment:  mild  Past Surgical History: No date: APPENDECTOMY     Comment:  age 68 01/04/2013: ARTHRODESIS ANTERIOR ANTERIOR CERVICLE SPINE No date: BACK SURGERY     Comment:  fusion thoracic area No date: CARDIAC CATHETERIZATION     Comment:  may 2012 and Nov 20, 2010 06/15/2015: CARDIAC CATHETERIZATION; N/A     Comment:  Procedure: Left Heart Cath and Coronary Angiography;                Surgeon: Wolm JINNY Rhyme, MD;  Location: [facility] INVASIVE               CV LAB;  Service: Cardiovascular;  Laterality: N/A; 06/15/2015: CARDIAC CATHETERIZATION; N/A     Comment:  Procedure: Coronary Stent Intervention;  Surgeon:               Marsa Dooms, MD;  Location: [facility] INVASIVE CV LAB;              Service: Cardiovascular;  Laterality: N/A; No date: CARDIOVASCULAR STRESS  TEST     Comment:  jan 2014 09/19/2014: COLONOSCOPY WITH PROPOFOL ; N/A     Comment:  Procedure: COLONOSCOPY WITH PROPOFOL ;  Surgeon: Lamar ONEIDA Holmes, MD;  Location: [facility] ENDOSCOPY;  Service:               Endoscopy;  Laterality: N/A; 04/03/2021: COLONOSCOPY WITH PROPOFOL ; N/A     Comment:  Procedure: COLONOSCOPY WITH PROPOFOL ;  Surgeon:               Maryruth Ole ONEIDA, MD;  Location: [facility] ENDOSCOPY;                Service: Endoscopy;  Laterality: N/A;  IDDM No date: CORONARY ANGIOPLASTY     Comment:  stent placement x3 09/19/2014:  ESOPHAGOGASTRODUODENOSCOPY; N/A     Comment:  Procedure: ESOPHAGOGASTRODUODENOSCOPY (EGD);  Surgeon:               Lamar ONEIDA Holmes, MD;  Location: [facility] ENDOSCOPY;                Service: Endoscopy;  Laterality: N/A; 02/09/2020: IR PERC CHOLECYSTOSTOMY 03/14/2020: IR RADIOLOGIST EVAL & MGMT No date: LABRINTHECTOMY     Comment:  1999 right ear 08/09/2019: LUMBAR SPINAL CORD SIMULATOR LEAD REMOVAL; Right     Comment:  Procedure: REMOVAL SPINAL CORD STIMULATOR PERCUTANEOUS               LEADS, REMOVAL PULSE GENERATOR;  Surgeon: Bluford Standing,               MD;  Location: [facility] ORS;  Service: Neurosurgery;                Laterality: Right;  LOCAL WITH MAC No date: mastoid shunt; Bilateral     Comment:  left, 2002, 1997 right ear, 1980 right ear No date: NEPHRECTOMY; Left 01/18/2019: PULSE GENERATOR IMPLANT; N/A     Comment:  Procedure: MEDTRONIC SPINAL CORD STIMULATOR BATTERY               EXCHANGE;  Surgeon: Bluford Standing, MD;  Location: [facility]               ORS;  Service: Neurosurgery;  Laterality: N/A; 04/20/2020: ROBOT ASSISTED LAPAROSCOPIC NEPHRECTOMY; Left     Comment:  Procedure: XI ROBOTIC ASSITED LAPAROSCOPIC NEPHRECTOMY;               Surgeon: Renda Glance, MD;  Location: [facility] ORS;  Service:              Urology;  Laterality: Left; 09/19/2014: SAVORY DILATION; N/A     Comment:  Procedure: SAVORY DILATION;  Surgeon: Lamar ONEIDA Holmes,               MD;  Location: [facility] ENDOSCOPY;  Service: Endoscopy;                Laterality: N/A; 04/06/2012: SHOULDER ARTHROSCOPY WITH SUBACROMIAL DECOMPRESSION; Left     Comment:  Procedure: SHOULDER ARTHROSCOPY WITH SUBACROMIAL               DECOMPRESSION;  Surgeon: Marcey Raman, MD;  Location: [facility]               OR;  Service: Orthopedics;  Laterality: Left;  left               shoulder arthroscopy, subacromial decompression and  distal clavicle resection No date: SPINAL CORD STIMULATOR IMPLANT; Right      Reproductive/Obstetrics negative OB ROS                              Anesthesia Physical Anesthesia Plan  ASA: 3  Anesthesia Plan: General ETT   Post-op Pain Management:    Induction: Intravenous  PONV Risk Score and Plan: 3 and Ondansetron  and Dexamethasone   Airway Management Planned: Oral ETT  Additional Equipment:   Intra-op Plan:   Post-operative Plan: Extubation in OR  Informed Consent: I have reviewed the patients History and Physical, chart, labs and discussed the procedure including the risks, benefits and alternatives for the proposed anesthesia with the patient or authorized representative who has indicated his/her understanding and acceptance.     Dental Advisory Given  Plan Discussed with: Anesthesiologist, CRNA and Surgeon  Anesthesia Plan Comments: (Patient consented for risks of anesthesia including but not limited to:  - adverse reactions to medications - damage to eyes, teeth, lips or other oral mucosa - nerve damage due to positioning  - sore throat or hoarseness - Damage to heart, brain, nerves, lungs, other parts of body or loss of life  Patient voiced understanding and assent.)        Anesthesia Quick Evaluation

## 2023-08-28 NOTE — Progress Notes (Signed)
 Urology Inpatient Progress Note  Subjective: He developed new fevers overnight, Tmax 38.5 C.  He has been mildly tachycardic and stably hypertensive.  Rocephin  has been ordered. Limited pelvis ultrasound yesterday showed complete decompression of the urinary bladder due to Foley catheter.  No significant residual clot burden appreciated. Creatinine up today, 6.63.  Hemoglobin slightly down, 14.5.  WBC count up, 15.3. He is accompanied today by his wife at the bedside.  He reports general malaise but denies pain. Foley catheter in place with dry tubing and night bag.  CBI is not running.  Anti-infectives: Anti-infectives (From admission, onward)    Start     Dose/Rate Route Frequency Ordered Stop   08/28/23 1100  cefTRIAXone  (ROCEPHIN ) 1 g in sodium chloride  0.9 % 100 mL IVPB        1 g 200 mL/hr over 30 Minutes Intravenous Every 24 hours 08/28/23 0900         Current Facility-Administered Medications  Medication Dose Route Frequency Provider Last Rate Last Admin   0.9 %  sodium chloride  infusion   Intravenous Continuous Trudy Anthony HERO, MD 75 mL/hr at 08/28/23 0914 New Bag at 08/28/23 0914   acetaminophen  (TYLENOL ) tablet 325 mg  325 mg Oral Q6H PRN Trudy Anthony HERO, MD   325 mg at 08/28/23 1015   ALPRAZolam  (XANAX ) tablet 0.5 mg  0.5 mg Oral QHS PRN Niu, Xilin, MD       calcium  gluconate 1 g/ 50 mL sodium chloride  IVPB  1 g Intravenous Once Trudy Anthony HERO, MD 50 mL/hr at 08/28/23 1020 1,000 mg at 08/28/23 1020   cefTRIAXone  (ROCEPHIN ) 1 g in sodium chloride  0.9 % 100 mL IVPB  1 g Intravenous Q24H Trudy Anthony HERO, MD       Chlorhexidine  Gluconate Cloth 2 % PADS 6 each  6 each Topical Daily Trudy Anthony HERO, MD   6 each at 08/28/23 9070   diltiazem  (CARDIZEM  CD) 24 hr capsule 240 mg  240 mg Oral Daily Niu, Xilin, MD   240 mg at 08/28/23 9071   gabapentin  (NEURONTIN ) capsule 800 mg  800 mg Oral BID Niu, Xilin, MD   800 mg at 08/28/23 9072   hydrALAZINE  (APRESOLINE )  injection 10 mg  10 mg Intravenous Q4H PRN Duncan, Hazel V, MD   10 mg at 08/27/23 0831   insulin  aspart (novoLOG ) injection 0-5 Units  0-5 Units Subcutaneous QHS Niu, Xilin, MD   2 Units at 08/27/23 2157   insulin  aspart (novoLOG ) injection 0-9 Units  0-9 Units Subcutaneous TID WC Niu, Xilin, MD   3 Units at 08/28/23 9071   insulin  regular human CONCENTRATED (HUMULIN  R) 500 UNIT/ML KwikPen 50 Units  50 Units Subcutaneous TID WC Niu, Xilin, MD   50 Units at 08/28/23 9071   latanoprost  (XALATAN ) 0.005 % ophthalmic solution 1 drop  1 drop Both Eyes QHS Niu, Xilin, MD   1 drop at 08/27/23 2159   metoprolol  succinate (TOPROL -XL) 24 hr tablet 100 mg  100 mg Oral BID WC Niu, Xilin, MD   100 mg at 08/28/23 9072   morphine  (PF) 2 MG/ML injection 2 mg  2 mg Intravenous Q4H PRN Niu, Xilin, MD   2 mg at 08/27/23 1439   nitroGLYCERIN  (NITROSTAT ) SL tablet 0.4 mg  0.4 mg Sublingual PRN Niu, Xilin, MD       ondansetron  (ZOFRAN ) injection 4 mg  4 mg Intravenous Q8H PRN Niu, Xilin, MD   4 mg at 08/27/23 0844   oxyCODONE  (Oxy IR/ROXICODONE )  immediate release tablet 10 mg  10 mg Oral Q6H PRN Trudy Anthony HERO, MD   10 mg at 08/27/23 1210   promethazine  (PHENERGAN ) 6.25 MG/5ML solution 12.5 mg  12.5 mg Oral Q6H PRN Trudy Anthony HERO, MD       rosuvastatin  (CRESTOR ) tablet 40 mg  40 mg Oral QHS Niu, Xilin, MD   40 mg at 08/27/23 2159   sodium chloride  irrigation 0.9 % 3,000 mL  3,000 mL Irrigation Continuous Niu, Xilin, MD   3,000 mL at 08/27/23 1722     Objective: Vital signs in last 24 hours: Temp:  [99.5 F (37.5 C)-101.3 F (38.5 C)] 99.5 F (37.5 C) (07/10 0751) Pulse Rate:  [108-115] 108 (07/10 0751) Resp:  [20-22] 20 (07/10 0751) BP: (145-163)/(73-88) 150/76 (07/10 0751) SpO2:  [86 %-93 %] 92 % (07/10 0751) FiO2 (%):  [21 %] 21 % (07/09 1710)  Intake/Output from previous day: 07/09 0701 - 07/10 0700 In: 5520 [P.O.:720] Out: 7700 [Urine:7700] Intake/Output this shift: Total I/O In: 1200  [Other:1200] Out: 2100 [Urine:2100]  Physical Exam Vitals and nursing note reviewed.  Constitutional:      General: He is not in acute distress.    Appearance: He is ill-appearing. He is not toxic-appearing or diaphoretic.  HENT:     Head: Normocephalic and atraumatic.  Pulmonary:     Effort: Pulmonary effort is normal. No respiratory distress.  Abdominal:     Comments: Protuberant abdomen, difficult to determine if bladder is palpable.  He reports mild discomfort to deep palpation.  Skin:    General: Skin is warm and dry.  Neurological:     Mental Status: He is alert.  Psychiatric:        Mood and Affect: Mood normal.        Behavior: Behavior normal.    Lab Results:  Recent Labs    08/27/23 0348 08/28/23 0505  WBC 10.6* 15.3*  HGB 15.4 14.5  HCT 47.4 44.2  PLT 210 211   BMET Recent Labs    08/27/23 0348 08/28/23 0505  NA 137 132*  K 4.6 5.5*  CL 103 99  CO2 21* 21*  GLUCOSE 252* 228*  BUN 33* 52*  CREATININE 2.69* 6.63*  CALCIUM  9.1 8.8*   PT/INR Recent Labs    08/26/23 1639  LABPROT 13.5  INR 1.0   Studies/Results: US  PELVIS LIMITED (TRANSABDOMINAL ONLY) Result Date: 08/27/2023 CLINICAL DATA:  Gross hematuria EXAM: LIMITED ULTRASOUND OF PELVIS TECHNIQUE: Limited transabdominal ultrasound examination of the pelvis was performed. COMPARISON:  CT 08/26/2023 FINDINGS: Urinary bladder is decompressed by Foley catheter. Pre-void volume: None significant Other findings:  None. IMPRESSION: Urinary bladder is completely decompressed by Foley catheter. Electronically Signed   By: Luke Bun M.D.   On: 08/27/2023 19:22   CT ABDOMEN PELVIS W CONTRAST Result Date: 08/26/2023 CLINICAL DATA:  Fredericka lower abdominal pain history of nephrectomy EXAM: CT ABDOMEN AND PELVIS WITH CONTRAST TECHNIQUE: Multidetector CT imaging of the abdomen and pelvis was performed using the standard protocol following bolus administration of intravenous contrast. RADIATION DOSE REDUCTION:  This exam was performed according to the departmental dose-optimization program which includes automated exposure control, adjustment of the mA and/or kV according to patient size and/or use of iterative reconstruction technique. CONTRAST:  75mL OMNIPAQUE  IOHEXOL  300 MG/ML  SOLN COMPARISON:  CT 11/10/2020, 07/23/2023, 07/23/2022 FINDINGS: Lower chest: Mild scarring at the left lung base. No acute airspace disease. Hepatobiliary: Hepatic steatosis. Cholecystectomy. No biliary dilatation Pancreas: Unremarkable. No pancreatic ductal dilatation  or surrounding inflammatory changes. Spleen: Normal in size without focal abnormality. Adrenals/Urinary Tract: Normal right adrenal gland. Nonvisualized left adrenal gland. Status post left nephrectomy. New mild right hydronephrosis. Hyperdense material in the right renal collecting system. No definite calcified stone along the course of the ureter. No significant excretion of contrast on delayed images. Vague hyperdense masslike area in the posterior bladder, series 2, image 80. Stomach/Bowel: Stomach nonenlarged. No dilated small bowel. No acute bowel wall thickening. Vascular/Lymphatic: Aortic atherosclerosis. No enlarged abdominal or pelvic lymph nodes. Reproductive: Negative prostate Other: Negative for pelvic effusion or free air. Subcutaneous infiltration within the anterior abdominal wall as before. Musculoskeletal: No acute or suspicious osseous abnormality IMPRESSION: 1. Status post left nephrectomy. New mild right hydronephrosis. Hyperdense material in the right renal collecting system, suspect for hemorrhagic material. No definite calcified stone along the course of the ureter. Vague hyperdense masslike area in the posterior bladder, probably represents hemorrhagic material/hematoma, short-term follow-up ultrasound could be obtained to ensure resolution. Poor excretion of contrast from right kidney on delayed views suggesting decreased renal function. 2. Hepatic  steatosis. 3. Aortic atherosclerosis. Aortic Atherosclerosis (ICD10-I70.0). Electronically Signed   By: Luke Bun M.D.   On: 08/26/2023 20:51   Assessment & Plan: 68 y.o. 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 Renda, diabetes on Jardiance, and CAD on Effient , now admitted with gross hematuria and clot retention requiring CBI.  Effient  is being held.  No significant residual clot burden in the bladder on ultrasound yesterday, however his renal function has significantly declined overnight and he is showing signs of infection of unclear etiology.  He is at risk for UTI with recent Foley catheterization and irrigation yesterday.  With no drainage from Foley catheter, I strongly suspect he has developed a new, obstructing clot in the bladder overnight.  I had a lengthy conversation with the patient and his wife at the bedside.  Patient was rather sleepy today and contributed minimally to the conversation.  I recommended proceeding to the OR with Dr. Stoioff at midday today for cystoscopy with right ureteral stent placement, left retrograde pyelogram, possible clot evacuation/fulguration/biopsy.  They are in agreement.  Case has been posted and informed consent order is in.  Please pause CBI in advance of procedure, I do not want to distend his bladder if he has a recurrent, obstructing clot.  Recommendations: - Agree with antibiotics per primary team - N.p.o. in advance of procedure - Cystoscopy with right ureteral stent placement, left retrograde pyelogram, possible clot evacuation/fulguration/biopsy with Dr. Twylla at noon today - Pause CBI in advance of procedure  Lucie Hones, PA-C 08/28/2023

## 2023-08-28 NOTE — Progress Notes (Signed)
 Attending Dr. Trudy made aware patient confused, afebile. Reported by CRNA when arrived to room to transport for surgery sats on RA 80% . Anesthesia Dr. Leavy also aware. CXR ordered by attending. Labs abnormal, urology aware. Will continue to monitor.

## 2023-08-28 NOTE — Anesthesia Procedure Notes (Signed)
 Procedure Name: Intubation Date/Time: 08/28/2023 12:19 PM  Performed by: Rosine Shona Jansky, CRNAPre-anesthesia Checklist: Patient identified, Emergency Drugs available, Suction available and Patient being monitored Patient Re-evaluated:Patient Re-evaluated prior to induction Oxygen Delivery Method: Circle system utilized Preoxygenation: Pre-oxygenation with 100% oxygen Induction Type: IV induction and Rapid sequence Laryngoscope Size: McGrath and 4 Grade View: Grade I Tube type: Oral Tube size: 7.5 mm Number of attempts: 1 Airway Equipment and Method: Stylet and Oral airway Placement Confirmation: ETT inserted through vocal cords under direct vision, positive ETCO2 and breath sounds checked- equal and bilateral Secured at: 21 cm Tube secured with: Tape Dental Injury: Teeth and Oropharynx as per pre-operative assessment

## 2023-08-28 NOTE — Care Management Important Message (Signed)
 Important Message  Patient Details  Name: Kenneth Osborn MRN: [id number] Date of Birth: 07-10-1955   Important Message Given:  Yes - Medicare IM     Kenneth Osborn 08/28/2023, 2:07 PM

## 2023-08-28 NOTE — Anesthesia Postprocedure Evaluation (Addendum)
 Anesthesia Post Note  Patient: Kenneth Osborn  Procedure(s) Performed: CYSTOSCOPY, WITH RETROGRADE PYELOGRAM AND URETERAL STENT INSERTION (Right)  Patient location during evaluation: PACU Anesthesia Type: General Level of consciousness: confused Pain management: pain level controlled Vital Signs Assessment: post-procedure vital signs reviewed and stable Respiratory status: respiratory function unstable (requring BiPAP to keep O2 sats above 90%) Cardiovascular status: blood pressure returned to baseline and stable Postop Assessment: no apparent nausea or vomiting Anesthetic complications: yes Comments: Called to PACU for altered mental status and hypoxia.  Sent ABG, CBC, CMP, lactate.  Earlier CXR showed only minor atelectasis. First treated with DuoNeb without adequate result, followed by BiPAP with good result.  Labs returned, pt with hypoxia, hyperkalemia, and high creatinine.  Sent to ICU on continuous BiPAP for further care.   No notable events documented.   Last Vitals:  Vitals:   08/28/23 1330 08/28/23 1345  BP: 137/78 139/82  Pulse: 96 95  Resp: 19 20  Temp: (!) 38.1 C   SpO2: 97% 97%    Last Pain:  Vitals:   08/28/23 1308  TempSrc:   PainSc: Asleep                 Debby Mines

## 2023-08-28 NOTE — Op Note (Signed)
    Preoperative diagnosis:  Solitary kidney with obstruction Acute kidney injury on CKD secondary to above Upper tract bleeding-undetermined etiology  Postoperative diagnosis:  Same  Procedure:  Cystoscopy Right ureteral stent placement (6 F/26 cm) Right retrograde pyelography with interpretation  Surgeon: Glendia C. Arbell Wycoff, M.D.  Anesthesia: General  Complications: None  Intraoperative findings:  Cystoscopy: Urethra normal in caliber without stricture, prostate with mild-moderate lateral lobe enlargement and moderate bladder neck elevation scant clot in bladder.  No efflux seen from right UO. Right retrograde pyelogram: Normal caliber ureter without dilation or filling defect.  Moderate right hydronephrosis with filling defects renal pelvis calyces most likely secondary to clot  EBL: Minimal  Specimens: Saline barbotage right renal pelvis for cytology  Indication: Kenneth Osborn is a 68 y.o. male with a history of renal cell carcinoma status post left nephrectomy 04/20/2020.  Admitted with gross hematuria and CT with contrast with hyperdense right collecting system consistent with clot.  Creatinine this morning 6.63 and right ureteral stent placement recommended.  After reviewing the management options for treatment, Kenneth Osborn elected to proceed with the above surgical procedure(s). We have discussed the potential benefits and risks of the procedure, side effects of the proposed treatment, the likelihood of the patient achieving the goals of the procedure, and any potential problems that might occur during the procedure or recuperation. Informed consent has been obtained.  Description of procedure:  The patient was taken to the operating room and general anesthesia was induced.  The patient was placed in the dorsal lithotomy position, prepped and draped in the usual sterile fashion, and preoperative antibiotics were administered. A preoperative time-out was performed.   A 21 French  cystoscope with 30 degree lens was lubricated, placed per urethra and advanced into the bladder under direct vision with findings as described above.  Attention directed to the right ureteral orifice and a 35F open-ended ureteral catheter was used to intubate the ureteral orifice.  Omnipaque  contrast was injected through the ureteral catheter and a retrograde pyelogram was performed with findings as described above.  A 0.038 Sensor guidewire was then placed through the ureteral catheter and into the renal pelvis under fluoroscopic guidance.  The catheter was advanced over the guidewire into the renal pelvis.  The guidewire was removed and saline barbotage was performed with bloody aspirate obtained and sent for cytology.  The guidewire was replaced and the ureteral catheter was removed.  A 35F/2 6 cm Contour ureteral stent was advanced over the guidewire.  The stent was positioned appropriately under fluoroscopic and cystoscopic guidance.  The wire was then removed with an adequate stent curl noted in the renal pelvis as well as in the bladder.  The bladder was then emptied and the procedure ended.  A 28F three-way Foley catheter was placed.  Low flow CBI was started with the effluent pink-tinged.  The patient appeared to tolerate the procedure well and without complications.  After anesthetic reversal the patient was transported to the PACU in stable condition.  Plan: Low flow CBI.  If effluent remains clear-pink may discontinue CBI Will follow  Glendia Barba, MD

## 2023-08-28 NOTE — Procedures (Signed)
 Central Venous Catheter Dialysis Insertion Procedure Note  Kenneth Osborn  [id number]  Jan 15, 1956  Date:08/28/23  Time:9:31 PM   Provider Performing:Andria Head Nelwyn   Procedure: Insertion of Non-tunneled Central Venous Catheter(36556) with US  guidance (23062)   Indication(s) Hemodialysis  Consent Risks of the procedure as well as the alternatives and risks of each were explained to the patient and/or caregiver.  Consent for the procedure was obtained and is signed in the bedside chart  Anesthesia Topical only with 1% lidocaine    Timeout Verified patient identification, verified procedure, site/side was marked, verified correct patient position, special equipment/implants available, medications/allergies/relevant history reviewed, required imaging and test results available.  Sterile Technique Maximal sterile technique including full sterile barrier drape, hand hygiene, sterile gown, sterile gloves, mask, hair covering, sterile ultrasound probe cover (if used).  Procedure Description Area of catheter insertion was cleaned with chlorhexidine  and draped in sterile fashion.  With real-time ultrasound guidance a HD catheter was placed into the right internal jugular vein. Nonpulsatile blood flow and easy flushing noted in all ports.  The catheter was sutured in place and sterile dressing applied.  Complications/Tolerance None; patient tolerated the procedure well. Chest X-ray is ordered to verify placement for internal jugular or subclavian cannulation.   EBL Minimal  Specimen(s) None

## 2023-08-28 NOTE — Consult Note (Signed)
 NAME:  Kenneth Osborn, MRN:  [id number], DOB:  December 20, 1955, LOS: 1 ADMISSION DATE:  08/26/2023, CONSULTATION DATE: 08/28/23 REFERRING MD: Dr. Trudy, CHIEF COMPLAINT: Gross Hematuria   History of Present Illness:  This is a 68 yo male with a PMH of left renal cell carcinoma s/p nephrectomy with solitary right kidney, type II diabetes mellitus, and CAD on effient .  He presented to [facility] ER on 07/08 with gross hematuria with blood clots onset the morning of 07/08.  Pt reported he also had difficulty initiating a urine stream along with worsening pain.    ED Course  Upon arrival to the ER significant lab results were: glucose 281/BUN 28/creatinine 1.79/AST 50/ALT 55. UA revealed >50 rbc's, but no bacteria present.  CT Abd/Pelvis W Contrast revealed hyperdense material in the right renal collecting system, suspect for hemorrhagic material and vague hyperdense masslike area in the posterior bladder, probably represents hemorrhagic material/hematoma.   Urology contacted by ER provider recommended placing three-way foley catheter and start bladder irrigation to help with passage of blood clots.  Pt admitted to the telemetry unit per hospitalist team for additional workup and treatment.  See detailed hospital course below under significant events.    Pertinent  Medical History  Anginal Pain  Anxiety  Bronchitis  Left Renal Cell Carcinoma s/p Nephrectomy  CAD on Effient   Type II Diabetes Mellitus (insulin  dependent) Dysphagia  GERD  Hypercholesteremia  HTN  MI  Short Segment Barrett's Esophagus  OSA  CKD stage IIIB  Significant Hospital Events: Including procedures, antibiotic start and stop dates in addition to other pertinent events   07/08: Pt admitted with acute kidney injury superimposed on CKD stage IIIB with left nephrectomy secondary to obstructive uropathy in the setting of gross hematuria with clot retention requiring CBI  07/09: Urology ordered pelvic US  which showed no significant  residual clot burden in the bladder  07/10: Pt developed worsening renal failure with hyperkalemia.  Pt underwent cystoscopy, right ureteral stent placement, and right retrograde pyelography with interpretation.  Postop pt developed acute encephalopathy and metabolic acidosis requiring Bipap and transfer to the stepdown unit.  PCCM team consulted to assist with management   Interim History / Subjective:  Pt somnolent but able to open his eyes to voice and following commands.  Pt mildly tachypneic on Bipap FiO2 @30 %  Objective    Blood pressure 134/79, pulse 88, temperature 98.3 F (36.8 C), temperature source Axillary, resp. rate (!) 21, height 5' 9 (1.753 m), weight 93 kg, SpO2 91%.    FiO2 (%):  [35 %] 35 % Pressure Support:  [5 cmH20] 5 cmH20   Intake/Output Summary (Last 24 hours) at 08/28/2023 1752 Last data filed at 08/28/2023 1600 Gross per 24 hour  Intake 4352.56 ml  Output 6075 ml  Net -1722.44 ml   Filed Weights   08/26/23 1636 08/28/23 1158  Weight: 92 kg 93 kg    Examination: General: Acutely-ill appearing male, mildly tachypneic on Bipap HENT: Supple, difficult to assess for JVD due to Bipap  Lungs: Diminished throughout, mildy tachypneic  Cardiovascular: Sinus bradycardia, no m/r/g, 2+ radial/2+ distal pulses, no edema  Abdomen: +BS x4, obese, distended, non tender  Extremities: Normal bulk and tone, moves all extremities  Neuro: Somnolent but able to follow commands, PERRLA  GU: Indwelling 3-way foley in place with continuous bladder irrigation draining light blood-tinged urine   Resolved problem list   Assessment and Plan   #Acute toxic metabolic encephalopathy  Hx: Anxiety and chronic back pain -  Correct metabolic derangements  - Avoid sedating medications as able  - Once mentation improves resume outpatient oxycodone , xanax , and gabapentin  to avoid withdrawal symptoms   #Sinus bradycardia  #Left bundle branch block  Hx: CAD on effient , HTN, MI, HLD,  and hypercholesteremia  - Continuous telemetry monitoring  - Troponin pending  - Hold outpatient antiarrhythmic for now  - Hold outpatient antihypertensive due to soft bp readings  - Hold outpatient effient   - Maintain map 65 or higher   #Acute respiratory failure suspect secondary to sedating medication  #Metabolic acidosis  Hx: OSA  - Continue Bipap for now  - Bipap or CPAP at bedtime  - Maintain O2 sats 92% or higher  - Intermittent CXR and ABG's   #Acute kidney injury superimposed on CKD stage IIIB with hx of left renal cell carcinoma requiring left nephrectomy secondary to obstructive uropathy in the setting of gross hematuria with clot retention s/p cystoscopy and right ureteral stent placement  #Metabolic acidosis  #Hyperkalemia  #Hyponatremia  - Trend BMP and vbg's  - Replace electrolytes as indicated  - Strict I&O's  - Avoid nephrotoxic agents as able  - Urology consulted appreciate input: continue continuous bladder irrigation   #Acute blood loss anemia secondary to gross hematuria  - Trend CBC  - Monitor for s/sx of bleeding  - Transfuse for hgb <7 - Hold outpatient effient  and avoid chemical VTE px; SCD's for VTE px   #Mild leukocytosis secondary to possible UTI  - Trend WBC and monitor fever curve  - Follow cultures  - Continue ceftriaxone  for now pending culture results and sensitivities   #Abdominal distension  - KUB pending   #Type II diabetes mellitus  - CBG's q4hrs  - SSI  - Follow hypo/hyperglycemic protocol  - Target CBG 140 to 180   Best Practice (right click and Reselect all SmartList Selections daily)   Diet/type: NPO DVT prophylaxis SCD Pressure ulcer(s): N/A GI prophylaxis: N/A Lines: N/A Foley:  Yes, and it is still needed Code Status:  full code Last date of multidisciplinary goals of care discussion [08/28/23]  Updated pts spouse at bedside regarding pts condition and current plan of care.  Pts spouse agreed to dialysis if deemed  necessary.  Also, discussed code status and per request pt FULL CODE  Labs   CBC: Recent Labs  Lab 08/26/23 1639 08/27/23 0348 08/28/23 0505 08/28/23 1546  WBC 7.7 10.6* 15.3* 13.4*  NEUTROABS 5.1  --   --  11.5*  HGB 14.2 15.4 14.5 12.9*  HCT 45.0 47.4 44.2 39.9  MCV 83.2 80.7 80.1 80.8  PLT 237 210 211 165    Basic Metabolic Panel: Recent Labs  Lab 08/26/23 1639 08/27/23 0348 08/28/23 0505 08/28/23 1546  NA 138 137 132* 132*  K 4.7 4.6 5.5* 5.7*  CL 104 103 99 99  CO2 24 21* 21* 19*  GLUCOSE 281* 252* 228* 160*  BUN 28* 33* 52* 56*  CREATININE 1.79* 2.69* 6.63* 7.49*  CALCIUM  9.5 9.1 8.8* 8.5*   GFR: Estimated Creatinine Clearance: 10.6 mL/min (A) (by C-G formula based on SCr of 7.49 mg/dL (H)). Recent Labs  Lab 08/26/23 1639 08/27/23 0348 08/28/23 0505 08/28/23 1546  WBC 7.7 10.6* 15.3* 13.4*  LATICACIDVEN  --   --   --  0.9    Liver Function Tests: Recent Labs  Lab 08/26/23 1639 08/28/23 1546  AST 50* 16  ALT 55* 30  ALKPHOS 92 77  BILITOT 0.6 0.6  PROT 7.4 6.5  ALBUMIN  3.7 3.0*   No results for input(s): LIPASE, AMYLASE in the last 168 hours. No results for input(s): AMMONIA in the last 168 hours.  ABG    Component Value Date/Time   PHART 7.26 (L) 08/28/2023 1540   PCO2ART 47 08/28/2023 1540   PO2ART 60 (L) 08/28/2023 1540   HCO3 21.1 08/28/2023 1540   TCO2 25 04/20/2020 1532   ACIDBASEDEF 6.1 (H) 08/28/2023 1540   O2SAT 89.6 08/28/2023 1540     Coagulation Profile: Recent Labs  Lab 08/26/23 1639  INR 1.0    Cardiac Enzymes: No results for input(s): CKTOTAL, CKMB, CKMBINDEX, TROPONINI in the last 168 hours.  HbA1C: Hgb A1c MFr Bld  Date/Time Value Ref Range Status  11/01/2021 08:32 PM 7.4 (H) 4.8 - 5.6 % Final    Comment:    (NOTE) Pre diabetes:          5.7%-6.4%  Diabetes:              >6.4%  Glycemic control for   <7.0% adults with diabetes   04/20/2020 09:08 PM 7.2 (H) 4.8 - 5.6 % Final    Comment:     (NOTE) Pre diabetes:          5.7%-6.4%  Diabetes:              >6.4%  Glycemic control for   <7.0% adults with diabetes     CBG: Recent Labs  Lab 08/28/23 0739 08/28/23 1107 08/28/23 1141 08/28/23 1311 08/28/23 1742  GLUCAP 232* 244* 207* 174* 164*    Review of Systems:   Unable to assess due to acute encephalopathy   Past Medical History:  He,  has a past medical history of Acute postoperative pain (12/03/2018), Allergic rhinitis (12/30/2012), Anemia, Anginal pain (HCC), Anxiety, Bronchitis, Can't get food down (08/12/2014), Cancer (HCC) (01/2020), Chronic back pain, Concussion (09/2015), Coronary artery disease, DDD (degenerative disc disease), cervical, Dehydration symptoms, Diabetes mellitus without complication (HCC), Dysphagia, GERD (gastroesophageal reflux disease), History of Meniere's disease (12/21/2014), History of thoracic spine surgery (S/P T9-10 IVD spacer) (12/21/2014), Hypercalcemia, Hypercholesteremia, Hyperkalemia, Hyperlipidemia, Hypertension, Meniere's disease, Myocardial infarction (HCC) (2012), Neuromuscular disorder (HCC), Proteinuria, Renal cell carcinoma (HCC), Renal disorder, Secondary hyperparathyroidism of renal origin (HCC), Severe sepsis (HCC) (02/07/2020), Short-segment Barrett's esophagus, and Sleep apnea.   Surgical History:   Past Surgical History:  Procedure Laterality Date   APPENDECTOMY     age 61   ARTHRODESIS ANTERIOR ANTERIOR CERVICLE SPINE  01/04/2013   BACK SURGERY     fusion thoracic area   CARDIAC CATHETERIZATION     may 2012 and Nov 20, 2010   CARDIAC CATHETERIZATION N/A 06/15/2015   Procedure: Left Heart Cath and Coronary Angiography;  Surgeon: Wolm JINNY Rhyme, MD;  Location: [facility] INVASIVE CV LAB;  Service: Cardiovascular;  Laterality: N/A;   CARDIAC CATHETERIZATION N/A 06/15/2015   Procedure: Coronary Stent Intervention;  Surgeon: Marsa Dooms, MD;  Location: [facility] INVASIVE CV LAB;  Service: Cardiovascular;   Laterality: N/A;   CARDIOVASCULAR STRESS TEST     jan 2014   COLONOSCOPY WITH PROPOFOL  N/A 09/19/2014   Procedure: COLONOSCOPY WITH PROPOFOL ;  Surgeon: Lamar ONEIDA Holmes, MD;  Location: [facility] ENDOSCOPY;  Service: Endoscopy;  Laterality: N/A;   COLONOSCOPY WITH PROPOFOL  N/A 04/03/2021   Procedure: COLONOSCOPY WITH PROPOFOL ;  Surgeon: Maryruth Ole ONEIDA, MD;  Location: [facility] ENDOSCOPY;  Service: Endoscopy;  Laterality: N/A;  IDDM   COLONOSCOPY WITH PROPOFOL  N/A 06/25/2023   Procedure: COLONOSCOPY WITH PROPOFOL ;  Surgeon: Wolfforth, Teodoro  K, MD;  Location: [facility] ENDOSCOPY;  Service: Gastroenterology;  Laterality: N/A;  IDDM, 1ST CASE   CORONARY ANGIOPLASTY     stent placement x3   ESOPHAGOGASTRODUODENOSCOPY N/A 09/19/2014   Procedure: ESOPHAGOGASTRODUODENOSCOPY (EGD);  Surgeon: Lamar ONEIDA Holmes, MD;  Location: [facility] ENDOSCOPY;  Service: Endoscopy;  Laterality: N/A;   IR PERC CHOLECYSTOSTOMY  02/09/2020   IR RADIOLOGIST EVAL & MGMT  03/14/2020   LABRINTHECTOMY     1999 right ear   LUMBAR SPINAL CORD SIMULATOR LEAD REMOVAL Right 08/09/2019   Procedure: REMOVAL SPINAL CORD STIMULATOR PERCUTANEOUS LEADS, REMOVAL PULSE GENERATOR;  Surgeon: Bluford Standing, MD;  Location: [facility] ORS;  Service: Neurosurgery;  Laterality: Right;  LOCAL WITH MAC   mastoid shunt Bilateral    left, 2002, 1997 right ear, 1980 right ear   NEPHRECTOMY Left    PULSE GENERATOR IMPLANT N/A 01/18/2019   Procedure: MEDTRONIC SPINAL CORD STIMULATOR BATTERY EXCHANGE;  Surgeon: Bluford Standing, MD;  Location: [facility] ORS;  Service: Neurosurgery;  Laterality: N/A;   ROBOT ASSISTED LAPAROSCOPIC NEPHRECTOMY Left 04/20/2020   Procedure: XI ROBOTIC ASSITED LAPAROSCOPIC NEPHRECTOMY;  Surgeon: Renda Glance, MD;  Location: [facility] ORS;  Service: Urology;  Laterality: Left;   SAVORY DILATION N/A 09/19/2014   Procedure: SAVORY DILATION;  Surgeon: Lamar ONEIDA Holmes, MD;  Location: [facility] ENDOSCOPY;  Service: Endoscopy;  Laterality: N/A;   SHOULDER ARTHROSCOPY WITH  SUBACROMIAL DECOMPRESSION Left 04/06/2012   Procedure: SHOULDER ARTHROSCOPY WITH SUBACROMIAL DECOMPRESSION;  Surgeon: Marcey Raman, MD;  Location: [facility] OR;  Service: Orthopedics;  Laterality: Left;  left shoulder arthroscopy, subacromial decompression and distal clavicle resection   SPINAL CORD STIMULATOR IMPLANT Right      Social History:   reports that he has never smoked. He has never used smokeless tobacco. He reports that he does not drink alcohol and does not use drugs.   Family History:  His family history includes Cancer in his sister; Diabetes in his maternal grandmother, mother, and paternal grandmother; Heart disease in his father, maternal aunt, maternal uncle, and mother.   Allergies No Known Allergies   Home Medications  Prior to Admission medications   Medication Sig Start Date End Date Taking? Authorizing Provider  ALPRAZolam  (XANAX ) 0.5 MG tablet Take 1 tablet (0.5 mg total) by mouth at bedtime. Patient taking differently: Take 0.5 mg by mouth at bedtime. PRN 04/26/20  Yes Lue Elsie BROCKS, MD  cetirizine (ZYRTEC) 10 MG tablet Take 10 mg by mouth daily.    Yes [provider]  chlorthalidone (HYGROTON) 25 MG tablet Take 25 mg by mouth daily. 11/18/22  Yes [provider]  cyclobenzaprine  (FLEXERIL ) 10 MG tablet Take 1 tablet (10 mg total) by mouth 3 (three) times daily as needed for muscle spasms. 04/26/20  Yes Lue Elsie BROCKS, MD  diazepam  (VALIUM ) 5 MG tablet Take 5 mg by mouth 3 (three) times daily as needed. 06/03/22  Yes [provider]  diltiazem  (CARDIZEM  CD) 240 MG 24 hr capsule Take 240 mg by mouth daily. 03/30/18  Yes [provider]  esomeprazole (NEXIUM) 40 MG capsule Take 40 mg by mouth 2 (two) times daily.   Yes [provider]  ezetimibe  (ZETIA ) 10 MG tablet Take 10 mg by mouth daily. 03/26/17  Yes [provider]  gabapentin  (NEURONTIN ) 800 MG tablet Take 800 mg by mouth 2 (two) times daily.   Yes [provider]  Glucagon (BAQSIMI ONE PACK NA) Place 1 Dose into the nose daily as needed (severely low blood sugar).  Yes [provider]  HUMULIN  R U-500 KWIKPEN 500 UNIT/ML KwikPen Inject 65-75 Units into the skin See admin instructions. Sliding scale Can adjust according to blood sugar Home med 11/02/21  Yes Awanda City, MD  isosorbide  mononitrate (IMDUR ) 30 MG 24 hr tablet Take 30 mg by mouth daily.   Yes [provider]  JARDIANCE 25 MG TABS tablet Take 25 mg by mouth daily. 11/13/22 11/13/23 Yes [provider]  latanoprost  (XALATAN ) 0.005 % ophthalmic solution Place 1 drop into both eyes at bedtime. 11/22/22  Yes [provider]  lubiprostone (AMITIZA) 24 MCG capsule Take 24 mcg by mouth 2 (two) times daily with a meal. 07/07/23  Yes [provider]  Magnesium  Oxide 250 MG TABS Take 500 mg by mouth daily.   Yes [provider]  meclizine  (ANTIVERT ) 25 MG tablet Take 25 mg by mouth See admin instructions. Take 25 mg in the morning, and additional 25 mg if needed up to a total of three per day for Meniere's disease 07/06/15  Yes [provider]  metFORMIN  (GLUCOPHAGE ) 1000 MG tablet Take 1,000 mg by mouth 2 (two) times daily. 07/25/20  Yes [provider]  metoprolol  succinate (TOPROL -XL) 100 MG 24 hr tablet Take 100 mg by mouth 2 (two) times daily. Take with or immediately following a meal.   Yes [provider]  montelukast  (SINGULAIR ) 10 MG tablet Take 10 mg by mouth at bedtime.    Yes [provider]  naloxone  (NARCAN ) nasal spray 4 mg/0.1 mL Place 1 spray into the nose as needed for up to 365 doses (for opioid-induced respiratory depresssion). In case of emergency (overdose), spray once into each nostril. If no response within 3 minutes, repeat application and call 911. 11/25/22 11/25/23 Yes Tanya Glisson, MD  niacin  (NIASPAN ) 500 MG CR tablet Take 500 mg by mouth at bedtime.   Yes [provider]   nitroGLYCERIN  (NITROSTAT ) 0.4 MG SL tablet Place 0.4 mg under the tongue as needed. 08/03/20  Yes [provider]  oxyCODONE  (OXY IR/ROXICODONE ) 5 MG immediate release tablet Take 1 tablet (5 mg total) by mouth every 8 (eight) hours. Must last 30 days. 08/17/23 09/16/23 Yes Patel, Seema K, NP  prasugrel  (EFFIENT ) 10 MG TABS tablet Take 10 mg by mouth daily. 11/22/19  Yes [provider]  rosuvastatin  (CRESTOR ) 40 MG tablet Take 40 mg by mouth at bedtime.   Yes [provider]  oxyCODONE  (OXY IR/ROXICODONE ) 5 MG immediate release tablet Take 1 tablet (5 mg total) by mouth every 8 (eight) hours. Must last 30 days. 07/18/23 08/17/23  Patel, Seema K, NP  oxyCODONE  (OXY IR/ROXICODONE ) 5 MG immediate release tablet Take 1 tablet (5 mg total) by mouth every 8 (eight) hours. Must last 30 days. 09/16/23 10/16/23  Patel, Seema K, NP  senna-docusate (SENOKOT-S) 8.6-50 MG tablet Take 1 tablet by mouth 2 (two) times daily. 04/26/20   Lue Elsie BROCKS, MD     Critical care time: 48 minutes       Lonell Moose, AGNP  Pulmonary/Critical Care Pager [phone] (please enter 7 digits) PCCM Consult Pager [phone] (please enter 7 digits)

## 2023-08-28 NOTE — Progress Notes (Addendum)
 Upon initial assessment, patient difficult to arouse. Responds to voice, able to state his name. MD notified patient's HR in 50s. EKG 12 lead obtained x2. See new orders for ABG, troponin, BMP, CBC. Also notified SBP<90; MAP WDL.

## 2023-08-28 NOTE — Interval H&P Note (Signed)
 History and Physical Interval Note:  08/28/2023 12:06 PM  Kenneth Osborn  has presented today for surgery, with the diagnosis of GROSS HEMATURIA, AKI.  The various methods of treatment have been discussed with the patient and family. After consideration of risks, benefits and other options for treatment, the patient has consented to  Procedure(s) with comments: CYSTOSCOPY, WITH RETROGRADE PYELOGRAM AND URETERAL STENT INSERTION (Right) - RIGHT STENT PLACEMENT, LEFT RETROGRADE PYELOGRAM EVACUATION HEMATOMA (N/A) CYSTOSCOPY, WITH BIOPSY (N/A) as a surgical intervention.  The patient's history has been reviewed, patient examined, no change in status, stable for surgery.  I have reviewed the patient's chart and labs.  Questions were answered to the patient's satisfaction.    AKI most likely secondary to obstructing clot collecting system and ureter.  The procedure was discussed in detail with patient and his wife.  All questions were answered and they desire to proceed  CV: RRR Lungs: Clear   Glendia JAYSON Barba

## 2023-08-28 NOTE — Progress Notes (Signed)
 Case discussed with ICU NP and hospitalist.  Will proceed with CRRT given worsening AKI and hyperkalemia.

## 2023-08-28 NOTE — H&P (View-Only) (Signed)
 Urology Inpatient Progress Note  Subjective: He developed new fevers overnight, Tmax 38.5 C.  He has been mildly tachycardic and stably hypertensive.  Rocephin  has been ordered. Limited pelvis ultrasound yesterday showed complete decompression of the urinary bladder due to Foley catheter.  No significant residual clot burden appreciated. Creatinine up today, 6.63.  Hemoglobin slightly down, 14.5.  WBC count up, 15.3. He is accompanied today by his wife at the bedside.  He reports general malaise but denies pain. Foley catheter in place with dry tubing and night bag.  CBI is not running.  Anti-infectives: Anti-infectives (From admission, onward)    Start     Dose/Rate Route Frequency Ordered Stop   08/28/23 1100  cefTRIAXone  (ROCEPHIN ) 1 g in sodium chloride  0.9 % 100 mL IVPB        1 g 200 mL/hr over 30 Minutes Intravenous Every 24 hours 08/28/23 0900         Current Facility-Administered Medications  Medication Dose Route Frequency Provider Last Rate Last Admin   0.9 %  sodium chloride  infusion   Intravenous Continuous Trudy Anthony HERO, MD 75 mL/hr at 08/28/23 0914 New Bag at 08/28/23 0914   acetaminophen  (TYLENOL ) tablet 325 mg  325 mg Oral Q6H PRN Trudy Anthony HERO, MD   325 mg at 08/28/23 1015   ALPRAZolam  (XANAX ) tablet 0.5 mg  0.5 mg Oral QHS PRN Niu, Xilin, MD       calcium  gluconate 1 g/ 50 mL sodium chloride  IVPB  1 g Intravenous Once Trudy Anthony HERO, MD 50 mL/hr at 08/28/23 1020 1,000 mg at 08/28/23 1020   cefTRIAXone  (ROCEPHIN ) 1 g in sodium chloride  0.9 % 100 mL IVPB  1 g Intravenous Q24H Trudy Anthony HERO, MD       Chlorhexidine  Gluconate Cloth 2 % PADS 6 each  6 each Topical Daily Trudy Anthony HERO, MD   6 each at 08/28/23 9070   diltiazem  (CARDIZEM  CD) 24 hr capsule 240 mg  240 mg Oral Daily Niu, Xilin, MD   240 mg at 08/28/23 9071   gabapentin  (NEURONTIN ) capsule 800 mg  800 mg Oral BID Niu, Xilin, MD   800 mg at 08/28/23 9072   hydrALAZINE  (APRESOLINE )  injection 10 mg  10 mg Intravenous Q4H PRN Duncan, Hazel V, MD   10 mg at 08/27/23 0831   insulin  aspart (novoLOG ) injection 0-5 Units  0-5 Units Subcutaneous QHS Niu, Xilin, MD   2 Units at 08/27/23 2157   insulin  aspart (novoLOG ) injection 0-9 Units  0-9 Units Subcutaneous TID WC Niu, Xilin, MD   3 Units at 08/28/23 9071   insulin  regular human CONCENTRATED (HUMULIN  R) 500 UNIT/ML KwikPen 50 Units  50 Units Subcutaneous TID WC Niu, Xilin, MD   50 Units at 08/28/23 9071   latanoprost  (XALATAN ) 0.005 % ophthalmic solution 1 drop  1 drop Both Eyes QHS Niu, Xilin, MD   1 drop at 08/27/23 2159   metoprolol  succinate (TOPROL -XL) 24 hr tablet 100 mg  100 mg Oral BID WC Niu, Xilin, MD   100 mg at 08/28/23 9072   morphine  (PF) 2 MG/ML injection 2 mg  2 mg Intravenous Q4H PRN Niu, Xilin, MD   2 mg at 08/27/23 1439   nitroGLYCERIN  (NITROSTAT ) SL tablet 0.4 mg  0.4 mg Sublingual PRN Niu, Xilin, MD       ondansetron  (ZOFRAN ) injection 4 mg  4 mg Intravenous Q8H PRN Niu, Xilin, MD   4 mg at 08/27/23 0844   oxyCODONE  (Oxy IR/ROXICODONE )  immediate release tablet 10 mg  10 mg Oral Q6H PRN Trudy Anthony HERO, MD   10 mg at 08/27/23 1210   promethazine  (PHENERGAN ) 6.25 MG/5ML solution 12.5 mg  12.5 mg Oral Q6H PRN Trudy Anthony HERO, MD       rosuvastatin  (CRESTOR ) tablet 40 mg  40 mg Oral QHS Niu, Xilin, MD   40 mg at 08/27/23 2159   sodium chloride  irrigation 0.9 % 3,000 mL  3,000 mL Irrigation Continuous Niu, Xilin, MD   3,000 mL at 08/27/23 1722     Objective: Vital signs in last 24 hours: Temp:  [99.5 F (37.5 C)-101.3 F (38.5 C)] 99.5 F (37.5 C) (07/10 0751) Pulse Rate:  [108-115] 108 (07/10 0751) Resp:  [20-22] 20 (07/10 0751) BP: (145-163)/(73-88) 150/76 (07/10 0751) SpO2:  [86 %-93 %] 92 % (07/10 0751) FiO2 (%):  [21 %] 21 % (07/09 1710)  Intake/Output from previous day: 07/09 0701 - 07/10 0700 In: 5520 [P.O.:720] Out: 7700 [Urine:7700] Intake/Output this shift: Total I/O In: 1200  [Other:1200] Out: 2100 [Urine:2100]  Physical Exam Vitals and nursing note reviewed.  Constitutional:      General: He is not in acute distress.    Appearance: He is ill-appearing. He is not toxic-appearing or diaphoretic.  HENT:     Head: Normocephalic and atraumatic.  Pulmonary:     Effort: Pulmonary effort is normal. No respiratory distress.  Abdominal:     Comments: Protuberant abdomen, difficult to determine if bladder is palpable.  He reports mild discomfort to deep palpation.  Skin:    General: Skin is warm and dry.  Neurological:     Mental Status: He is alert.  Psychiatric:        Mood and Affect: Mood normal.        Behavior: Behavior normal.    Lab Results:  Recent Labs    08/27/23 0348 08/28/23 0505  WBC 10.6* 15.3*  HGB 15.4 14.5  HCT 47.4 44.2  PLT 210 211   BMET Recent Labs    08/27/23 0348 08/28/23 0505  NA 137 132*  K 4.6 5.5*  CL 103 99  CO2 21* 21*  GLUCOSE 252* 228*  BUN 33* 52*  CREATININE 2.69* 6.63*  CALCIUM  9.1 8.8*   PT/INR Recent Labs    08/26/23 1639  LABPROT 13.5  INR 1.0   Studies/Results: US  PELVIS LIMITED (TRANSABDOMINAL ONLY) Result Date: 08/27/2023 CLINICAL DATA:  Gross hematuria EXAM: LIMITED ULTRASOUND OF PELVIS TECHNIQUE: Limited transabdominal ultrasound examination of the pelvis was performed. COMPARISON:  CT 08/26/2023 FINDINGS: Urinary bladder is decompressed by Foley catheter. Pre-void volume: None significant Other findings:  None. IMPRESSION: Urinary bladder is completely decompressed by Foley catheter. Electronically Signed   By: Luke Bun M.D.   On: 08/27/2023 19:22   CT ABDOMEN PELVIS W CONTRAST Result Date: 08/26/2023 CLINICAL DATA:  Kenneth Osborn lower abdominal pain history of nephrectomy EXAM: CT ABDOMEN AND PELVIS WITH CONTRAST TECHNIQUE: Multidetector CT imaging of the abdomen and pelvis was performed using the standard protocol following bolus administration of intravenous contrast. RADIATION DOSE REDUCTION:  This exam was performed according to the departmental dose-optimization program which includes automated exposure control, adjustment of the mA and/or kV according to patient size and/or use of iterative reconstruction technique. CONTRAST:  75mL OMNIPAQUE  IOHEXOL  300 MG/ML  SOLN COMPARISON:  CT 11/10/2020, 07/23/2023, 07/23/2022 FINDINGS: Lower chest: Mild scarring at the left lung base. No acute airspace disease. Hepatobiliary: Hepatic steatosis. Cholecystectomy. No biliary dilatation Pancreas: Unremarkable. No pancreatic ductal dilatation  or surrounding inflammatory changes. Spleen: Normal in size without focal abnormality. Adrenals/Urinary Tract: Normal right adrenal gland. Nonvisualized left adrenal gland. Status post left nephrectomy. New mild right hydronephrosis. Hyperdense material in the right renal collecting system. No definite calcified stone along the course of the ureter. No significant excretion of contrast on delayed images. Vague hyperdense masslike area in the posterior bladder, series 2, image 80. Stomach/Bowel: Stomach nonenlarged. No dilated small bowel. No acute bowel wall thickening. Vascular/Lymphatic: Aortic atherosclerosis. No enlarged abdominal or pelvic lymph nodes. Reproductive: Negative prostate Other: Negative for pelvic effusion or free air. Subcutaneous infiltration within the anterior abdominal wall as before. Musculoskeletal: No acute or suspicious osseous abnormality IMPRESSION: 1. Status post left nephrectomy. New mild right hydronephrosis. Hyperdense material in the right renal collecting system, suspect for hemorrhagic material. No definite calcified stone along the course of the ureter. Vague hyperdense masslike area in the posterior bladder, probably represents hemorrhagic material/hematoma, short-term follow-up ultrasound could be obtained to ensure resolution. Poor excretion of contrast from right kidney on delayed views suggesting decreased renal function. 2. Hepatic  steatosis. 3. Aortic atherosclerosis. Aortic Atherosclerosis (ICD10-I70.0). Electronically Signed   By: Luke Bun M.D.   On: 08/26/2023 20:51   Assessment & Plan: 68 y.o. 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 Renda, diabetes on Jardiance, and CAD on Effient , now admitted with gross hematuria and clot retention requiring CBI.  Effient  is being held.  No significant residual clot burden in the bladder on ultrasound yesterday, however his renal function has significantly declined overnight and he is showing signs of infection of unclear etiology.  He is at risk for UTI with recent Foley catheterization and irrigation yesterday.  With no drainage from Foley catheter, I strongly suspect he has developed a new, obstructing clot in the bladder overnight.  I had a lengthy conversation with the patient and his wife at the bedside.  Patient was rather sleepy today and contributed minimally to the conversation.  I recommended proceeding to the OR with Dr. Stoioff at midday today for cystoscopy with right ureteral stent placement, left retrograde pyelogram, possible clot evacuation/fulguration/biopsy.  They are in agreement.  Case has been posted and informed consent order is in.  Please pause CBI in advance of procedure, I do not want to distend his bladder if he has a recurrent, obstructing clot.  Recommendations: - Agree with antibiotics per primary team - N.p.o. in advance of procedure - Cystoscopy with right ureteral stent placement, left retrograde pyelogram, possible clot evacuation/fulguration/biopsy with Dr. Twylla at noon today - Pause CBI in advance of procedure  Lucie Hones, PA-C 08/28/2023

## 2023-08-28 NOTE — Transfer of Care (Signed)
 Immediate Anesthesia Transfer of Care Note  Patient: Kenneth Osborn  Procedure(s) Performed: CYSTOSCOPY, WITH RETROGRADE PYELOGRAM AND URETERAL STENT INSERTION (Right)  Patient Location: PACU  Anesthesia Type:General  Level of Consciousness: drowsy  Airway & Oxygen Therapy: Patient Spontanous Breathing and Patient connected to face mask oxygen  Post-op Assessment: Report given to RN  Post vital signs: stable  Last Vitals:  Vitals Value Taken Time  BP 137/74 08/28/23 13:08  Temp 37.7 C 08/28/23 13:08  Pulse 95 08/28/23 13:15  Resp 20 08/28/23 13:15  SpO2 97 % 08/28/23 13:15  Vitals shown include unfiled device data.  Last Pain:  Vitals:   08/28/23 1158  TempSrc: Temporal  PainSc:       Patients Stated Pain Goal: 2 (08/27/23 9177)  Complications: No notable events documented.

## 2023-08-29 ENCOUNTER — Encounter: Payer: Self-pay | Admitting: Urology

## 2023-08-29 ENCOUNTER — Inpatient Hospital Stay (HOSPITAL_COMMUNITY)
Admit: 2023-08-29 | Discharge: 2023-08-29 | Disposition: A | Discharge status: Home / Self Care | Attending: Critical Care Medicine | Admitting: Critical Care Medicine

## 2023-08-29 DIAGNOSIS — N179 Acute kidney failure, unspecified: Secondary | ICD-10-CM | POA: Diagnosis not present

## 2023-08-29 DIAGNOSIS — N1831 Chronic kidney disease, stage 3a: Secondary | ICD-10-CM | POA: Diagnosis not present

## 2023-08-29 DIAGNOSIS — R310 Gross hematuria: Secondary | ICD-10-CM | POA: Diagnosis not present

## 2023-08-29 DIAGNOSIS — E875 Hyperkalemia: Secondary | ICD-10-CM

## 2023-08-29 DIAGNOSIS — R9431 Abnormal electrocardiogram [ECG] [EKG]: Secondary | ICD-10-CM | POA: Diagnosis not present

## 2023-08-29 DIAGNOSIS — K567 Ileus, unspecified: Secondary | ICD-10-CM

## 2023-08-29 LAB — RENAL FUNCTION PANEL
Albumin: 2.9 g/dL — ABNORMAL LOW (ref 3.5–5.0)
Albumin: 2.9 g/dL — ABNORMAL LOW (ref 3.5–5.0)
Anion gap: 15 (ref 5–15)
Anion gap: 10 (ref 5–15)
BUN: 45 mg/dL — ABNORMAL HIGH (ref 8–23)
BUN: 42 mg/dL — ABNORMAL HIGH (ref 8–23)
CO2: 21 mmol/L — ABNORMAL LOW (ref 22–32)
CO2: 26 mmol/L (ref 22–32)
Calcium: 8.9 mg/dL (ref 8.9–10.3)
Calcium: 8.6 mg/dL — ABNORMAL LOW (ref 8.9–10.3)
Chloride: 101 mmol/L (ref 98–111)
Chloride: 101 mmol/L (ref 98–111)
Creatinine, Ser: 5.76 mg/dL — ABNORMAL HIGH (ref 0.61–1.24)
Creatinine, Ser: 4.4 mg/dL — ABNORMAL HIGH (ref 0.61–1.24)
GFR, Estimated: 10 mL/min — ABNORMAL LOW (ref >60)
GFR, Estimated: 14 mL/min — ABNORMAL LOW (ref >60)
Glucose, Bld: 164 mg/dL — ABNORMAL HIGH (ref 70–99)
Glucose, Bld: 272 mg/dL — ABNORMAL HIGH (ref 70–99)
Phosphorus: 4.7 mg/dL — ABNORMAL HIGH (ref 2.5–4.6)
Phosphorus: 4.1 mg/dL (ref 2.5–4.6)
Potassium: 4.3 mmol/L (ref 3.5–5.1)
Potassium: 4.7 mmol/L (ref 3.5–5.1)
Sodium: 137 mmol/L (ref 135–145)
Sodium: 137 mmol/L (ref 135–145)

## 2023-08-29 LAB — ECHOCARDIOGRAM COMPLETE
Height: 69 in
S' Lateral: 2.3 cm
Weight: 3305.14 [oz_av]

## 2023-08-29 LAB — CBC
HCT: 38.2 % — ABNORMAL LOW (ref 39.0–52.0)
Hemoglobin: 12.3 g/dL — ABNORMAL LOW (ref 13.0–17.0)
MCH: 26.5 pg (ref 26.0–34.0)
MCHC: 32.2 g/dL (ref 30.0–36.0)
MCV: 82.2 fL (ref 80.0–100.0)
Platelets: 183 K/uL (ref 150–400)
RBC: 4.65 MIL/uL (ref 4.22–5.81)
RDW: 15.6 % — ABNORMAL HIGH (ref 11.5–15.5)
WBC: 11.4 K/uL — ABNORMAL HIGH (ref 4.0–10.5)
nRBC: 0 % (ref 0.0–0.2)

## 2023-08-29 LAB — GLUCOSE, CAPILLARY
Glucose-Capillary: 159 mg/dL — ABNORMAL HIGH (ref 70–99)
Glucose-Capillary: 169 mg/dL — ABNORMAL HIGH (ref 70–99)
Glucose-Capillary: 219 mg/dL — ABNORMAL HIGH (ref 70–99)
Glucose-Capillary: 244 mg/dL — ABNORMAL HIGH (ref 70–99)
Glucose-Capillary: 260 mg/dL — ABNORMAL HIGH (ref 70–99)
Glucose-Capillary: 263 mg/dL — ABNORMAL HIGH (ref 70–99)

## 2023-08-29 LAB — MAGNESIUM: Magnesium: 1.8 mg/dL (ref 1.7–2.4)

## 2023-08-29 MED ORDER — SENNA 8.6 MG PO TABS
2.0000 | ORAL_TABLET | Freq: Every day | ORAL | Status: DC
  Administered 2023-08-29 – 2023-09-03 (×4): 17.2 mg via ORAL
  Filled 2023-08-29 (×5): qty 2

## 2023-08-29 MED ORDER — HYDROXYZINE HCL 25 MG PO TABS
25.0000 mg | ORAL_TABLET | Freq: Once | ORAL | Status: AC
  Administered 2023-08-29: 25 mg via ORAL
  Filled 2023-08-29: qty 1

## 2023-08-29 MED ORDER — BISACODYL 10 MG RE SUPP
10.0000 mg | Freq: Two times a day (BID) | RECTAL | Status: AC
  Administered 2023-08-29: 10 mg via RECTAL
  Filled 2023-08-29 (×2): qty 1

## 2023-08-29 MED ORDER — MAGNESIUM SULFATE 2 GM/50ML IV SOLN
2.0000 g | Freq: Once | INTRAVENOUS | Status: AC
  Administered 2023-08-29: 2 g via INTRAVENOUS
  Filled 2023-08-29: qty 50

## 2023-08-29 MED ORDER — DILTIAZEM HCL ER COATED BEADS 120 MG PO CP24
240.0000 mg | ORAL_CAPSULE | Freq: Every day | ORAL | Status: DC
  Administered 2023-08-29 – 2023-09-03 (×6): 240 mg via ORAL
  Filled 2023-08-29: qty 1
  Filled 2023-08-29: qty 2
  Filled 2023-08-29 (×3): qty 1
  Filled 2023-08-29: qty 2

## 2023-08-29 MED ORDER — PRISMASOL BGK 4/2.5 32-4-2.5 MEQ/L EC SOLN
Status: DC
Start: 2023-08-29 — End: 2023-08-30

## 2023-08-29 MED ORDER — ISOSORBIDE MONONITRATE ER 30 MG PO TB24
30.0000 mg | ORAL_TABLET | Freq: Every day | ORAL | Status: DC
  Administered 2023-08-29 – 2023-09-03 (×6): 30 mg via ORAL
  Filled 2023-08-29 (×6): qty 1

## 2023-08-29 MED ORDER — POLYETHYLENE GLYCOL 3350 17 G PO PACK
17.0000 g | PACK | Freq: Once | ORAL | Status: AC
  Administered 2023-08-29: 17 g via ORAL
  Filled 2023-08-29: qty 1

## 2023-08-29 MED ORDER — MONTELUKAST SODIUM 10 MG PO TABS
10.0000 mg | ORAL_TABLET | Freq: Every day | ORAL | Status: DC
  Administered 2023-08-29 – 2023-09-02 (×5): 10 mg via ORAL
  Filled 2023-08-29 (×5): qty 1

## 2023-08-29 MED ORDER — ENSURE PLUS HIGH PROTEIN PO LIQD
237.0000 mL | Freq: Three times a day (TID) | ORAL | Status: DC
  Administered 2023-08-29 – 2023-09-02 (×8): 237 mL via ORAL

## 2023-08-29 MED ORDER — RENA-VITE PO TABS
1.0000 | ORAL_TABLET | Freq: Every day | ORAL | Status: DC
  Administered 2023-08-29 – 2023-09-02 (×5): 1 via ORAL
  Filled 2023-08-29 (×5): qty 1

## 2023-08-29 MED ORDER — LUBIPROSTONE 24 MCG PO CAPS
24.0000 ug | ORAL_CAPSULE | Freq: Two times a day (BID) | ORAL | Status: DC
  Administered 2023-08-29 – 2023-09-03 (×8): 24 ug via ORAL
  Filled 2023-08-29 (×11): qty 1

## 2023-08-29 MED ORDER — PRISMASOL BGK 4/2.5 32-4-2.5 MEQ/L EC SOLN: Status: DC

## 2023-08-29 MED ORDER — GABAPENTIN 300 MG PO CAPS
300.0000 mg | ORAL_CAPSULE | Freq: Two times a day (BID) | ORAL | Status: DC
  Administered 2023-08-29 – 2023-09-03 (×10): 300 mg via ORAL
  Filled 2023-08-29 (×10): qty 1

## 2023-08-29 NOTE — Progress Notes (Signed)
 CBI paused by Dawna NP.

## 2023-08-29 NOTE — Progress Notes (Signed)
 Initial Nutrition Assessment  DOCUMENTATION CODES:   Not applicable  INTERVENTION:   Ensure Plus High Protein po TID, each supplement provides 350 kcal and 20 grams of protein  Rena-vit po daily   Liberalize diet   Pt at high refeed risk; recommend monitor potassium, magnesium  and phosphorus labs daily until stable  Daily weights   Bowel regimen per MD  NUTRITION DIAGNOSIS:   Inadequate oral intake related to acute illness as evidenced by per patient/family report.  GOAL:   Patient will meet greater than or equal to 90% of their needs  MONITOR:   PO intake, Supplement acceptance, Labs, Weight trends, Skin, I & O's  REASON FOR ASSESSMENT:   Consult Assessment of nutrition requirement/status  ASSESSMENT:   68 y.o. male with medical history significant for CAD s/p stent angioplasty, COPD, chronic constipation, chronic low back pain, GERD, hypertension, OSA, MI, Barrett's esophagus, diabetes mellitus, anxiety, CKD III, RCC s/p left nephrectomy 04/20/2020 and Mnire's disease who is admitted with bradycardia and AKI secondary to obstructive uropathy with clot retention s/p cystoscopy and right ureteral stent placement 7/10. CRRT initiated 7/10.  Met with pt and wife in room today. Pt is known to this RD from a previous lengthy admission. Pt reports good appetite and oral intake at baseline. Pt reports that his appetite is fair in hospital but reports that he does not like the bland food. Pt working on breakfast at the time of RD visit and had eaten a few bites of eggs. RD discussed with pt the importance of adequate nutrition needed to preserve lean muscle and replace losses from CRRT. RD will add supplements and vitamins to help pt meet his estimated needs (prefers chocolate). RD will also liberalize pt's diet as pt is not eating enough to exceed nutrient limits. Pt is at refeed risk. Per chart, pt appears weight stable at baseline. Pt is up ~4lbs since admission but appears to  be at his UBW currently. No BM since 7/8; moderate stool burden noted on KUB. Suppository given today.    Medications reviewed and include: insulin , senokot, ceftriaxone   Labs reviewed: K 4.3 wnl, BUN 45(H), creat 5.76(H), P 4.7(H), Mg 1.8 wnl  Wbc- 11.4(H) Cbgs- 159, 169 x 24 hrs  UOP-   NUTRITION - FOCUSED PHYSICAL EXAM:  Flowsheet Row Most Recent Value  Orbital Region No depletion  Upper Arm Region No depletion  Thoracic and Lumbar Region No depletion  Buccal Region No depletion  Temple Region No depletion  Clavicle Bone Region No depletion  Clavicle and Acromion Bone Region No depletion  Scapular Bone Region No depletion  Dorsal Hand No depletion  Patellar Region No depletion  Anterior Thigh Region No depletion  Posterior Calf Region No depletion  Edema (RD Assessment) Mild  Hair Reviewed  Eyes Reviewed  Mouth Reviewed  Skin Reviewed  Nails Reviewed   Diet Order:   Diet Order             Diet Carb Modified Fluid consistency: Thin; Room service appropriate? Yes  Diet effective now                  EDUCATION NEEDS:   Education needs have been addressed  Skin:  Skin Assessment: Reviewed RN Assessment  Last BM:  7/8  Height:   Ht Readings from Last 1 Encounters:  08/28/23 5' 9 (1.753 m)    Weight:   Wt Readings from Last 1 Encounters:  08/29/23 93.7 kg    Ideal Body Weight:  72.7 kg  BMI:  Body mass index is 30.51 kg/m.  Estimated Nutritional Needs:   Kcal:  2200-2500kcal/day  Protein:  110-125g/day  Fluid:  1.8-2.1L/day  Augustin Shams MS, RD, LDN If unable to be reached, please send secure chat to RD inpatient available from 8:00a-4:00p daily

## 2023-08-29 NOTE — Consult Note (Signed)
 PHARMACY CONSULT NOTE - ELECTROLYTES  Pharmacy Consult for Electrolyte Monitoring and Replacement   Recent Labs: Potassium (mmol/L)  Date Value  08/29/2023 4.3  02/24/2014 3.9   Magnesium  (mg/dL)  Date Value  92/88/7974 1.8  05/14/2013 1.5 (L)   Calcium  (mg/dL)  Date Value  92/88/7974 8.9   Calcium , Total (mg/dL)  Date Value  98/92/7983 8.4 (L)   Albumin  (g/dL)  Date Value  92/88/7974 2.9 (L)  08/18/2013 3.8   Phosphorus (mg/dL)  Date Value  92/88/7974 4.7 (H)   Sodium (mmol/L)  Date Value  08/29/2023 137  02/24/2014 142   Height: 5' 9 (175.3 cm) Weight: 93.7 kg (206 lb 9.1 oz) IBW/kg (Calculated) : 70.7 Estimated Creatinine Clearance: 13.9 mL/min (A) (by C-G formula based on SCr of 5.76 mg/dL (H)).  Assessment  Kenneth Osborn is a 68 y.o. male presenting with gross hematuria with blood clots . PMH significant for left renal cell carcinoma s/p nephrectomy with solitary right kidney, type II diabetes mellitus, and CAD on effient  . Pharmacy has been consulted to monitor and replace electrolytes.  Diet: NPO, sips w/ meds MIVF: NS @ 75 mL/hr Pertinent medications: N/A  Goal of Therapy: Electrolytes within normal limits  Plan:  Mg 1.8, magnesium  sulfate 2 g IV x 1 dose Continue to monitor renal function panel(s) BID while on CRRT  Thank you for allowing pharmacy to be a part of this patient's care.  Kenneth Osborn 08/29/2023 7:56 AM

## 2023-08-29 NOTE — Plan of Care (Signed)
  Problem: Metabolic: Goal: Ability to maintain appropriate glucose levels will improve Outcome: Progressing   Problem: Nutritional: Goal: Maintenance of adequate nutrition will improve Outcome: Progressing   Problem: Skin Integrity: Goal: Risk for impaired skin integrity will decrease Outcome: Progressing   Problem: Clinical Measurements: Goal: Respiratory complications will improve Outcome: Progressing Goal: Cardiovascular complication will be avoided Outcome: Progressing   Problem: Coping: Goal: Ability to adjust to condition or change in health will improve Outcome: Not Progressing   Problem: Health Behavior/Discharge Planning: Goal: Ability to manage health-related needs will improve Outcome: Not Progressing

## 2023-08-29 NOTE — Progress Notes (Signed)
 [facility]  ROUNDING NOTE   Subjective:   Patient well-known to us  as we follow him in the office for chronic kidney disease in the setting of left nephrectomy.  He presented with gross hematuria.  He was found to have new right-sided hydronephrosis with hyperdense material in the right renal collecting system. He was taken for cystoscopy and right retrograde pyelography yesterday. Right ureteral stent placed.  Objective:  Vital signs in last 24 hours:  Temp:  [98.3 F (36.8 C)-100.6 F (38.1 C)] 98.4 F (36.9 C) (07/11 1200) Pulse Rate:  [55-102] 94 (07/11 1200) Resp:  [12-30] 23 (07/11 1200) BP: (86-151)/(58-108) 118/95 (07/11 1200) SpO2:  [91 %-100 %] 93 % (07/11 1200) FiO2 (%):  [30 %-35 %] 30 % (07/10 2016) Weight:  [93.7 kg] 93.7 kg (07/11 0417)  Weight change:  Filed Weights   08/26/23 1636 08/28/23 1158 08/29/23 0417  Weight: 92 kg 93 kg 93.7 kg    Intake/Output: I/O last 3 completed shifts: In: 10361.6 [P.O.:720; I.V.:1641.3; Other:7800; IV Piggyback:200.3] Out: 87293 [Urine:12070; Other:300]   Intake/Output this shift:  Total I/O In: 791.9 [P.O.:600; I.V.:142; IV Piggyback:49.9] Out: 315 [Urine:300]  Physical Exam: General: No acute distress  Head: Normocephalic, atraumatic. Moist oral mucosal membranes  Neck: Supple  Lungs:  Clear to auscultation, normal effort  Heart: S1S2 no rubs  Abdomen:  Soft, nontender, bowel sounds present  Extremities: 1+ peripheral edema.  Neurologic: Awake, alert, following commands  Skin: No acute rash  Access: Right IJ temporary hemodialysis catheter    Basic Metabolic Panel: Recent Labs  Lab 08/28/23 0505 08/28/23 1546 08/28/23 1828 08/28/23 2230 08/29/23 0422  NA 132* 132* 134* 137 137  K 5.5* 5.7* 6.0* 5.2* 4.3  CL 99 99 100 99 101  CO2 21* 19* 19* 25 21*  GLUCOSE 228* 160* 280* 267* 164*  BUN 52* 56* 58* 61* 45*  CREATININE 6.63* 7.49* 7.55* 7.84* 5.76*  CALCIUM  8.8* 8.5* 8.2* 8.5* 8.9  MG   --   --   --   --  1.8  PHOS  --   --   --   --  4.7*    Liver Function Tests: Recent Labs  Lab 08/26/23 1639 08/28/23 1546 08/29/23 0422  AST 50* 16  --   ALT 55* 30  --   ALKPHOS 92 77  --   BILITOT 0.6 0.6  --   PROT 7.4 6.5  --   ALBUMIN  3.7 3.0* 2.9*   No results for input(s): LIPASE, AMYLASE in the last 168 hours. No results for input(s): AMMONIA in the last 168 hours.  CBC: Recent Labs  Lab 08/26/23 1639 08/27/23 0348 08/28/23 0505 08/28/23 1546 08/28/23 1950 08/29/23 0420  WBC 7.7 10.6* 15.3* 13.4*  --  11.4*  NEUTROABS 5.1  --   --  11.5*  --   --   HGB 14.2 15.4 14.5 12.9* 13.4 12.3*  HCT 45.0 47.4 44.2 39.9 40.2 38.2*  MCV 83.2 80.7 80.1 80.8  --  82.2  PLT 237 210 211 165  --  183    Cardiac Enzymes: No results for input(s): CKTOTAL, CKMB, CKMBINDEX, TROPONINI in the last 168 hours.  BNP: Invalid input(s): POCBNP  CBG: Recent Labs  Lab 08/28/23 1933 08/28/23 2331 08/29/23 0359 08/29/23 0739 08/29/23 1132  GLUCAP 291* 225* 169* 159* 219*    Microbiology: Results for orders placed or performed during the hospital encounter of 08/26/23  Culture, blood (Routine X 2) w Reflex to ID Panel  Status: None (Preliminary result)   Collection Time: 08/28/23  9:27 AM   Specimen: BLOOD  Result Value Ref Range Status   Specimen Description BLOOD BLOOD RIGHT HAND  Final   Special Requests   Final    BOTTLES DRAWN AEROBIC AND ANAEROBIC Blood Culture adequate volume   Culture   Final    NO GROWTH < 24 HOURS Performed at [facility], [address] [place], [place] [address]    Report Status PENDING  Incomplete  Culture, blood (Routine X 2) w Reflex to ID Panel     Status: None (Preliminary result)   Collection Time: 08/28/23  9:32 AM   Specimen: BLOOD  Result Value Ref Range Status   Specimen Description BLOOD BLOOD LEFT HAND  Final   Special Requests   Final    BOTTLES DRAWN AEROBIC AND ANAEROBIC Blood Culture adequate  volume   Culture   Final    NO GROWTH < 24 HOURS Performed at [facility], [address] [place], [place] [address]    Report Status PENDING  Incomplete    Coagulation Studies: Recent Labs    08/26/23 1639  LABPROT 13.5  INR 1.0    Urinalysis: Recent Labs    08/26/23 1934  COLORURINE RED*  LABSPEC TEST NOT REPORTED DUE TO COLOR INTERFERENCE OF URINE PIGMENT  PHURINE TEST NOT REPORTED DUE TO COLOR INTERFERENCE OF URINE PIGMENT  GLUCOSEU TEST NOT REPORTED DUE TO COLOR INTERFERENCE OF URINE PIGMENT*  HGBUR TEST NOT REPORTED DUE TO COLOR INTERFERENCE OF URINE PIGMENT*  BILIRUBINUR TEST NOT REPORTED DUE TO COLOR INTERFERENCE OF URINE PIGMENT*  KETONESUR TEST NOT REPORTED DUE TO COLOR INTERFERENCE OF URINE PIGMENT*  PROTEINUR TEST NOT REPORTED DUE TO COLOR INTERFERENCE OF URINE PIGMENT*  NITRITE TEST NOT REPORTED DUE TO COLOR INTERFERENCE OF URINE PIGMENT*  LEUKOCYTESUR TEST NOT REPORTED DUE TO COLOR INTERFERENCE OF URINE PIGMENT*      Imaging: DG Chest Port 1 View Result Date: 08/28/2023 CLINICAL DATA:  Central line placement EXAM: PORTABLE CHEST 1 VIEW COMPARISON:  08/28/2023, 12/11/2020, CT 07/23/2023 FINDINGS: Hypoventilatory changes. New right IJ central venous catheter tip at the right atrium. Cardiomegaly with vascular congestion and diffuse interstitial and hazy pulmonary opacity, probably due to edema. No pneumothorax. Hardware in the cervical spine IMPRESSION: 1. New right IJ central venous catheter tip at the right atrium. No pneumothorax. 2. Cardiomegaly with vascular congestion and diffuse interstitial and hazy pulmonary opacity, probably due to edema. Electronically Signed   By: Luke Bun M.D.   On: 08/28/2023 22:00   DG Abd 1 View Result Date: 08/28/2023 CLINICAL DATA:  Abdominal distension EXAM: ABDOMEN - 1 VIEW COMPARISON:  02/09/2020 FINDINGS: Mild gaseous distension of the colon. No evidence of bowel obstruction. Moderate stool burden. No  organomegaly or free air. Right ureteral stent in place. IMPRESSION: Mild gaseous distention of the colon likely reflects mild ileus. Moderate stool burden. Electronically Signed   By: Franky Crease M.D.   On: 08/28/2023 19:09   DG Chest Port 1 View Result Date: 08/28/2023 CLINICAL DATA:  Dyspnea. EXAM: PORTABLE CHEST 1 VIEW COMPARISON:  December 11, 2020. FINDINGS: Stable cardiomediastinal silhouette. Left lung is clear. Minimal right basilar subsegmental atelectasis is noted. Bony thorax is unremarkable. IMPRESSION: Minimal right basilar subsegmental atelectasis. Electronically Signed   By: Lynwood Landy Raddle M.D.   On: 08/28/2023 16:32   DG OR UROLOGY CYSTO IMAGE ([facility] ONLY) Result Date: 08/28/2023 There is no interpretation for this exam.  This order  is for images obtained during a surgical procedure.  Please See Surgeries Tab for more information regarding the procedure.   US  PELVIS LIMITED (TRANSABDOMINAL ONLY) Result Date: 08/27/2023 CLINICAL DATA:  Gross hematuria EXAM: LIMITED ULTRASOUND OF PELVIS TECHNIQUE: Limited transabdominal ultrasound examination of the pelvis was performed. COMPARISON:  CT 08/26/2023 FINDINGS: Urinary bladder is decompressed by Foley catheter. Pre-void volume: None significant Other findings:  None. IMPRESSION: Urinary bladder is completely decompressed by Foley catheter. Electronically Signed   By: Luke Bun M.D.   On: 08/27/2023 19:22     Medications:    cefTRIAXone  (ROCEPHIN )  IV 1 g (08/29/23 1059)   prismasol  BGK 4/2.5 400 mL/hr at 08/29/23 1156   prismasol  BGK 4/2.5 400 mL/hr at 08/29/23 1156   prismasol  BGK 4/2.5 2,000 mL/hr at 08/29/23 1156   sodium chloride  irrigation      bisacodyl   10 mg Rectal BID   Chlorhexidine  Gluconate Cloth  6 each Topical Daily   diltiazem   240 mg Oral Daily   feeding supplement  237 mL Oral TID BM   gabapentin   300 mg Oral BID   insulin  aspart  0-9 Units Subcutaneous Q4H   isosorbide  mononitrate  30 mg Oral Daily    latanoprost   1 drop Both Eyes QHS   lubiprostone   24 mcg Oral BID WC   montelukast   10 mg Oral QHS   multivitamin  1 tablet Oral QHS   rosuvastatin   40 mg Oral QHS   senna  2 tablet Oral Daily   acetaminophen , ALPRAZolam , heparin , hydrALAZINE , nitroGLYCERIN , ondansetron  (ZOFRAN ) IV, oxyCODONE , sodium chloride   Assessment/ Plan:  68 y.o. male with past medical history of renal cell carcinoma status post left nephrectomy, chronic kidney disease stage IIIb baseline EGFR 38, coronary disease with history of stent placement on Effient , hypertension, lipidemia, diabetes mellitus type 2, anxiety, chronic back pain, Mnire's disease who presented with hematuria and status post cystoscopy, right ureteral stent placement, and retrograde pyelography.  1.  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  The patient's acute kidney injury appears to be secondary to new right-sided hydronephrosis which was due to right renal bleeding.  Patient status post cystoscopy and right retrograde pyelography.  Given severe hyperkalemia patient was started on CRRT yesterday.  Renal parameters have improved.  Continue CRRT 1 additional day and reassess for additional need tomorrow.  2.  Hyperkalemia.  Serum potassium down to 4.3.  Patient switched to 4K bath.    LOS: 2 Ouida Abeyta 7/11/202512:41 PM

## 2023-08-29 NOTE — Progress Notes (Signed)
 Pt changed to Kenneth Osborn, 02 saturations above 95.

## 2023-08-29 NOTE — Progress Notes (Signed)
*  PRELIMINARY RESULTS* Echocardiogram 2D Echocardiogram has been performed.  Kenneth Osborn 08/29/2023, 12:14 PM

## 2023-08-29 NOTE — Progress Notes (Signed)
 NAME:  Kenneth Osborn, MRN:  [id number], DOB:  07-23-1955, LOS: 2 ADMISSION DATE:  08/26/2023, CONSULTATION DATE: 08/28/23 REFERRING MD: Dr. Trudy, CHIEF COMPLAINT: Gross Hematuria   History of Present Illness:  This is a 68 yo male with a PMH of left renal cell carcinoma s/p nephrectomy with solitary right kidney, type II diabetes mellitus, and CAD on effient .  He presented to [facility] ER on 07/08 with gross hematuria with blood clots onset the morning of 07/08.  Pt reported he also had difficulty initiating a urine stream along with worsening pain.    ED Course  Upon arrival to the ER significant lab results were: glucose 281/BUN 28/creatinine 1.79/AST 50/ALT 55. UA revealed >50 rbc's, but no bacteria present.  CT Abd/Pelvis W Contrast revealed hyperdense material in the right renal collecting system, suspect for hemorrhagic material and vague hyperdense masslike area in the posterior bladder, probably represents hemorrhagic material/hematoma.   Urology contacted by ER provider recommended placing three-way foley catheter and start bladder irrigation to help with passage of blood clots.  Pt admitted to the telemetry unit per hospitalist team for additional workup and treatment.  See detailed hospital course below under significant events.    Pertinent  Medical History  Anginal Pain  Anxiety  Bronchitis  Left Renal Cell Carcinoma s/p Nephrectomy  CAD on Effient   Type II Diabetes Mellitus (insulin  dependent) Dysphagia  GERD  Hypercholesteremia  HTN  MI  Short Segment Barrett's Esophagus  OSA  CKD stage IIIB  Significant Hospital Events: Including procedures, antibiotic start and stop dates in addition to other pertinent events   07/08: Pt admitted with acute kidney injury superimposed on CKD stage IIIB with left nephrectomy secondary to obstructive uropathy in the setting of gross hematuria with clot retention requiring CBI  07/09: Urology ordered pelvic US  which showed no significant  residual clot burden in the bladder  07/10: Pt developed worsening renal failure with hyperkalemia.  Pt underwent cystoscopy, right ureteral stent placement, and right retrograde pyelography with interpretation.  Postop pt developed acute encephalopathy and metabolic acidosis requiring Bipap and transfer to the stepdown unit.  PCCM team consulted to assist with management  7/11: Started on CRRT last night. Increased confusion, Atarax  x 1. Urine clear/blood tinged, CBI held. Transitioned off Bipap to Nasal canula. KUB with mild ileus, and stool burden.   Interim History / Subjective:  7/10: BC x 2 NGTD 7/10: Urine culture NGTD Objective    Blood pressure 125/69, pulse 76, temperature 98.7 F (37.1 C), temperature source Axillary, resp. rate (!) 30, height 5' 9 (1.753 m), weight 93.7 kg, SpO2 95%.    FiO2 (%):  [30 %-35 %] 30 % Pressure Support:  [5 cmH20] 5 cmH20   Intake/Output Summary (Last 24 hours) at 08/29/2023 0629 Last data filed at 08/29/2023 0600 Gross per 24 hour  Intake 9566.64 ml  Output 88458 ml  Net -1974.36 ml   Filed Weights   08/26/23 1636 08/28/23 1158 08/29/23 0417  Weight: 92 kg 93 kg 93.7 kg   Examination: General: NAD HENT: Supple Lungs: Diminished throughout, intermittently tachypneic  Cardiovascular: NSR, no m/r/g, 2+ radial/2+ distal pulses, no edema  Abdomen: +BS x4, obese, distended, non tender  Extremities: Normal bulk and tone, moves all extremities  Neuro: Drowsy, restless, follows commands, PERRLA  GU: Indwelling 3-way foley in place with continuous bladder irrigation on hold, clear/blood tinged urine   Resolved problem list   Assessment and Plan   #Acute toxic metabolic encephalopathy  Hx: Anxiety  and chronic back pain - Correct metabolic derangements  - Avoid sedating medications as able  - Continue home Neurontin  800 mg BID, Xanax  PRN, Oxycodone  PRN  #Sinus bradycardia, resolved #Left bundle branch block  Hx: CAD on effient , HTN, MI,  HLD, and hypercholesteremia  - Continuous telemetry monitoring  - Troponin 29 - Resume home Cardizem  and Imdur  today today - Holding home Chlorthalidone, and Metoprolol  due to bradycardia on admission, resume as tolerated - Home Crestor  - Hold outpatient effient   - Maintain map 65 or higher   #Acute respiratory failure suspect secondary to sedating medication, improving  #Metabolic acidosis, improving  Hx: OSA  - Bipap or CPAP at bedtime, tolerating Nasal canula this morning  - Maintain O2 sats 92% or higher  - Intermittent CXR and ABG's  - Resume home Singulair   #Acute kidney injury superimposed on CKD stage IIIB with hx of left renal cell carcinoma requiring left nephrectomy secondary to obstructive uropathy in the setting of gross hematuria with clot retention s/p cystoscopy and right ureteral stent placement  #Metabolic acidosis, improving  #Hyperkalemia, resolved #Hyponatremia, resolved  - Trend BMP  - Replace electrolytes as indicated  - Strict I&O's  - Avoid nephrotoxic agents as able  - Urology consulted appreciate input: continue continuous bladder irrigation on hold this morning  #Acute blood loss anemia secondary to gross hematuria  - Daily CBC  - Monitor for s/sx of bleeding  - Transfuse for hgb <7 - Hold outpatient effient  and avoid chemical VTE px; SCD's for VTE px   #Mild leukocytosis secondary to possible UTI  - Trend WBC and monitor fever curve  - Follow cultures as above - Continue Ceftriaxone  for now pending culture results and sensitivities   #Abdominal distension  #Mild Ileus - KUB Mild gaseous distention of the colon likely reflects mild ileus, Moderate stool burden - Increase bowel regimen, suppository daily  #Type II diabetes mellitus  - CBG's q4hrs  - SSI  - Hold home Metformin  and Jardiance - Follow hypo/hyperglycemic protocol  - Target CBG 140 to 180   Best Practice (right click and Reselect all SmartList Selections daily)   Diet/type:  NPO DVT prophylaxis SCD, holding SQH and Effient  due to hematuria Pressure ulcer(s): N/A GI prophylaxis: N/A Lines: RIJ Dialysis catheter  Foley:  Yes, and it is still needed Code Status:  full code  Labs   CBC: Recent Labs  Lab 08/26/23 1639 08/27/23 0348 08/28/23 0505 08/28/23 1546 08/28/23 1950 08/29/23 0420  WBC 7.7 10.6* 15.3* 13.4*  --  11.4*  NEUTROABS 5.1  --   --  11.5*  --   --   HGB 14.2 15.4 14.5 12.9* 13.4 12.3*  HCT 45.0 47.4 44.2 39.9 40.2 38.2*  MCV 83.2 80.7 80.1 80.8  --  82.2  PLT 237 210 211 165  --  183   Basic Metabolic Panel: Recent Labs  Lab 08/28/23 0505 08/28/23 1546 08/28/23 1828 08/28/23 2230 08/29/23 0422  NA 132* 132* 134* 137 137  K 5.5* 5.7* 6.0* 5.2* 4.3  CL 99 99 100 99 101  CO2 21* 19* 19* 25 21*  GLUCOSE 228* 160* 280* 267* 164*  BUN 52* 56* 58* 61* 45*  CREATININE 6.63* 7.49* 7.55* 7.84* 5.76*  CALCIUM  8.8* 8.5* 8.2* 8.5* 8.9  MG  --   --   --   --  1.8  PHOS  --   --   --   --  4.7*   GFR: Estimated Creatinine Clearance: 13.9 mL/min (A) (  by C-G formula based on SCr of 5.76 mg/dL (H)). Recent Labs  Lab 08/27/23 0348 08/28/23 0505 08/28/23 1546 08/28/23 1950 08/29/23 0420  WBC 10.6* 15.3* 13.4*  --  11.4*  LATICACIDVEN  --   --  0.9 1.2  --    Liver Function Tests: Recent Labs  Lab 08/26/23 1639 08/28/23 1546 08/29/23 0422  AST 50* 16  --   ALT 55* 30  --   ALKPHOS 92 77  --   BILITOT 0.6 0.6  --   PROT 7.4 6.5  --   ALBUMIN  3.7 3.0* 2.9*   No results for input(s): LIPASE, AMYLASE in the last 168 hours. No results for input(s): AMMONIA in the last 168 hours.  ABG    Component Value Date/Time   PHART 7.3 (L) 08/28/2023 1820   PCO2ART 44 08/28/2023 1820   PO2ART 70 (L) 08/28/2023 1820   HCO3 25.8 08/28/2023 2230   TCO2 25 04/20/2020 1532   ACIDBASEDEF 1.8 08/28/2023 2230   O2SAT 67.9 08/28/2023 2230    Coagulation Profile: Recent Labs  Lab 08/26/23 1639  INR 1.0   Cardiac Enzymes: No  results for input(s): CKTOTAL, CKMB, CKMBINDEX, TROPONINI in the last 168 hours.  HbA1C: Hgb A1c MFr Bld  Date/Time Value Ref Range Status  11/01/2021 08:32 PM 7.4 (H) 4.8 - 5.6 % Final    Comment:    (NOTE) Pre diabetes:          5.7%-6.4%  Diabetes:              >6.4%  Glycemic control for   <7.0% adults with diabetes   04/20/2020 09:08 PM 7.2 (H) 4.8 - 5.6 % Final    Comment:    (NOTE) Pre diabetes:          5.7%-6.4%  Diabetes:              >6.4%  Glycemic control for   <7.0% adults with diabetes     CBG: Recent Labs  Lab 08/28/23 1742 08/28/23 1825 08/28/23 1933 08/28/23 2331 08/29/23 0359  GLUCAP 164* 262* 291* 225* 169*    Review of Systems:   Unable to assess due to acute encephalopathy   Past Medical History:  He,  has a past medical history of Acute postoperative pain (12/03/2018), Allergic rhinitis (12/30/2012), Anemia, Anginal pain (HCC), Anxiety, Bronchitis, Can't get food down (08/12/2014), Cancer (HCC) (01/2020), Chronic back pain, Concussion (09/2015), Coronary artery disease, DDD (degenerative disc disease), cervical, Dehydration symptoms, Diabetes mellitus without complication (HCC), Dysphagia, GERD (gastroesophageal reflux disease), History of Meniere's disease (12/21/2014), History of thoracic spine surgery (S/P T9-10 IVD spacer) (12/21/2014), Hypercalcemia, Hypercholesteremia, Hyperkalemia, Hyperlipidemia, Hypertension, Meniere's disease, Myocardial infarction (HCC) (2012), Neuromuscular disorder (HCC), Proteinuria, Renal cell carcinoma (HCC), Renal disorder, Secondary hyperparathyroidism of renal origin (HCC), Severe sepsis (HCC) (02/07/2020), Short-segment Barrett's esophagus, and Sleep apnea.   Surgical History:   Past Surgical History:  Procedure Laterality Date   APPENDECTOMY     age 40   ARTHRODESIS ANTERIOR ANTERIOR CERVICLE SPINE  01/04/2013   BACK SURGERY     fusion thoracic area   CARDIAC CATHETERIZATION     may 2012 and Nov 20, 2010   CARDIAC CATHETERIZATION N/A 06/15/2015   Procedure: Left Heart Cath and Coronary Angiography;  Surgeon: Wolm JINNY Rhyme, MD;  Location: [facility] INVASIVE CV LAB;  Service: Cardiovascular;  Laterality: N/A;   CARDIAC CATHETERIZATION N/A 06/15/2015   Procedure: Coronary Stent Intervention;  Surgeon: Marsa Dooms, MD;  Location: [facility] INVASIVE CV  LAB;  Service: Cardiovascular;  Laterality: N/A;   CARDIOVASCULAR STRESS TEST     jan 2014   COLONOSCOPY WITH PROPOFOL  N/A 09/19/2014   Procedure: COLONOSCOPY WITH PROPOFOL ;  Surgeon: Lamar ONEIDA Holmes, MD;  Location: [facility] ENDOSCOPY;  Service: Endoscopy;  Laterality: N/A;   COLONOSCOPY WITH PROPOFOL  N/A 04/03/2021   Procedure: COLONOSCOPY WITH PROPOFOL ;  Surgeon: Maryruth Ole ONEIDA, MD;  Location: [facility] ENDOSCOPY;  Service: Endoscopy;  Laterality: N/A;  IDDM   COLONOSCOPY WITH PROPOFOL  N/A 06/25/2023   Procedure: COLONOSCOPY WITH PROPOFOL ;  Surgeon: Toledo, Ladell POUR, MD;  Location: [facility] ENDOSCOPY;  Service: Gastroenterology;  Laterality: N/A;  IDDM, 1ST CASE   CORONARY ANGIOPLASTY     stent placement x3   ESOPHAGOGASTRODUODENOSCOPY N/A 09/19/2014   Procedure: ESOPHAGOGASTRODUODENOSCOPY (EGD);  Surgeon: Lamar ONEIDA Holmes, MD;  Location: [facility] ENDOSCOPY;  Service: Endoscopy;  Laterality: N/A;   IR PERC CHOLECYSTOSTOMY  02/09/2020   IR RADIOLOGIST EVAL & MGMT  03/14/2020   LABRINTHECTOMY     1999 right ear   LUMBAR SPINAL CORD SIMULATOR LEAD REMOVAL Right 08/09/2019   Procedure: REMOVAL SPINAL CORD STIMULATOR PERCUTANEOUS LEADS, REMOVAL PULSE GENERATOR;  Surgeon: Bluford Standing, MD;  Location: [facility] ORS;  Service: Neurosurgery;  Laterality: Right;  LOCAL WITH MAC   mastoid shunt Bilateral    left, 2002, 1997 right ear, 1980 right ear   NEPHRECTOMY Left    PULSE GENERATOR IMPLANT N/A 01/18/2019   Procedure: MEDTRONIC SPINAL CORD STIMULATOR BATTERY EXCHANGE;  Surgeon: Bluford Standing, MD;  Location: [facility] ORS;  Service: Neurosurgery;  Laterality: N/A;    ROBOT ASSISTED LAPAROSCOPIC NEPHRECTOMY Left 04/20/2020   Procedure: XI ROBOTIC ASSITED LAPAROSCOPIC NEPHRECTOMY;  Surgeon: Renda Glance, MD;  Location: [facility] ORS;  Service: Urology;  Laterality: Left;   SAVORY DILATION N/A 09/19/2014   Procedure: SAVORY DILATION;  Surgeon: Lamar ONEIDA Holmes, MD;  Location: [facility] ENDOSCOPY;  Service: Endoscopy;  Laterality: N/A;   SHOULDER ARTHROSCOPY WITH SUBACROMIAL DECOMPRESSION Left 04/06/2012   Procedure: SHOULDER ARTHROSCOPY WITH SUBACROMIAL DECOMPRESSION;  Surgeon: Marcey Raman, MD;  Location: [facility] OR;  Service: Orthopedics;  Laterality: Left;  left shoulder arthroscopy, subacromial decompression and distal clavicle resection   SPINAL CORD STIMULATOR IMPLANT Right      Social History:   reports that he has never smoked. He has never used smokeless tobacco. He reports that he does not drink alcohol and does not use drugs.   Family History:  His family history includes Cancer in his sister; Diabetes in his maternal grandmother, mother, and paternal grandmother; Heart disease in his father, maternal aunt, maternal uncle, and mother.   Allergies No Known Allergies   Home Medications  Prior to Admission medications   Medication Sig Start Date End Date Taking? Authorizing Provider  ALPRAZolam  (XANAX ) 0.5 MG tablet Take 1 tablet (0.5 mg total) by mouth at bedtime. Patient taking differently: Take 0.5 mg by mouth at bedtime. PRN 04/26/20  Yes Lue Elsie BROCKS, MD  cetirizine (ZYRTEC) 10 MG tablet Take 10 mg by mouth daily.    Yes [provider]  chlorthalidone (HYGROTON) 25 MG tablet Take 25 mg by mouth daily. 11/18/22  Yes [provider]  cyclobenzaprine  (FLEXERIL ) 10 MG tablet Take 1 tablet (10 mg total) by mouth 3 (three) times daily as needed for muscle spasms. 04/26/20  Yes Lue Elsie BROCKS, MD  diazepam  (VALIUM ) 5 MG tablet Take 5 mg by mouth 3 (three) times daily as needed. 06/03/22  Yes [provider]  diltiazem   (CARDIZEM  CD) 240 MG 24  hr capsule Take 240 mg by mouth daily. 03/30/18  Yes [provider]  esomeprazole (NEXIUM) 40 MG capsule Take 40 mg by mouth 2 (two) times daily.   Yes [provider]  ezetimibe  (ZETIA ) 10 MG tablet Take 10 mg by mouth daily. 03/26/17  Yes [provider]  gabapentin  (NEURONTIN ) 800 MG tablet Take 800 mg by mouth 2 (two) times daily.   Yes [provider]  Glucagon (BAQSIMI ONE PACK NA) Place 1 Dose into the nose daily as needed (severely low blood sugar).   Yes [provider]  HUMULIN  R U-500 KWIKPEN 500 UNIT/ML KwikPen Inject 65-75 Units into the skin See admin instructions. Sliding scale Can adjust according to blood sugar Home med 11/02/21  Yes Awanda City, MD  isosorbide  mononitrate (IMDUR ) 30 MG 24 hr tablet Take 30 mg by mouth daily.   Yes [provider]  JARDIANCE 25 MG TABS tablet Take 25 mg by mouth daily. 11/13/22 11/13/23 Yes [provider]  latanoprost  (XALATAN ) 0.005 % ophthalmic solution Place 1 drop into both eyes at bedtime. 11/22/22  Yes [provider]  lubiprostone  (AMITIZA ) 24 MCG capsule Take 24 mcg by mouth 2 (two) times daily with a meal. 07/07/23  Yes [provider]  Magnesium  Oxide 250 MG TABS Take 500 mg by mouth daily.   Yes [provider]  meclizine  (ANTIVERT ) 25 MG tablet Take 25 mg by mouth See admin instructions. Take 25 mg in the morning, and additional 25 mg if needed up to a total of three per day for Meniere's disease 07/06/15  Yes [provider]  metFORMIN  (GLUCOPHAGE ) 1000 MG tablet Take 1,000 mg by mouth 2 (two) times daily. 07/25/20  Yes [provider]  metoprolol  succinate (TOPROL -XL) 100 MG 24 hr tablet Take 100 mg by mouth 2 (two) times daily. Take with or immediately following a meal.   Yes [provider]  montelukast  (SINGULAIR ) 10 MG tablet Take 10 mg by mouth at bedtime.    Yes [provider]  naloxone   (NARCAN ) nasal spray 4 mg/0.1 mL Place 1 spray into the nose as needed for up to 365 doses (for opioid-induced respiratory depresssion). In case of emergency (overdose), spray once into each nostril. If no response within 3 minutes, repeat application and call 911. 11/25/22 11/25/23 Yes Tanya Glisson, MD  niacin  (NIASPAN ) 500 MG CR tablet Take 500 mg by mouth at bedtime.   Yes [provider]  nitroGLYCERIN  (NITROSTAT ) 0.4 MG SL tablet Place 0.4 mg under the tongue as needed. 08/03/20  Yes [provider]  oxyCODONE  (OXY IR/ROXICODONE ) 5 MG immediate release tablet Take 1 tablet (5 mg total) by mouth every 8 (eight) hours. Must last 30 days. 08/17/23 09/16/23 Yes Patel, Seema K, NP  prasugrel  (EFFIENT ) 10 MG TABS tablet Take 10 mg by mouth daily. 11/22/19  Yes [provider]  rosuvastatin  (CRESTOR ) 40 MG tablet Take 40 mg by mouth at bedtime.   Yes [provider]  oxyCODONE  (OXY IR/ROXICODONE ) 5 MG immediate release tablet Take 1 tablet (5 mg total) by mouth every 8 (eight) hours. Must last 30 days. 07/18/23 08/17/23  Patel, Seema K, NP  oxyCODONE  (OXY IR/ROXICODONE ) 5 MG immediate release tablet Take 1 tablet (5 mg total) by mouth every 8 (eight) hours. Must last 30 days. 09/16/23 10/16/23  Patel, Seema K, NP  senna-docusate (SENOKOT-S) 8.6-50 MG tablet Take 1 tablet by mouth 2 (two) times daily. 04/26/20   Lue Elsie BROCKS, MD  Critical care time: 44 minutes     Dawna Metro NP Pulmonary/Critical Care Pager [phone] (please enter 7 digits) PCCM Consult Pager [phone] (please enter 7 digits)

## 2023-08-29 NOTE — Progress Notes (Addendum)
 Urology Consult Follow Up  Subjective: POD # 1  Patient is somnolent, but arousable with stimuli.  He remains confused and he cannot answer questions.   CBI is off.  Foley catheter in place draining clear orange-pink urine.   Afebrile, tachypneic  Serum creatinine down to 5.76 from 7.84 yesterday, he is currently on CRRT.  Hemoglobin relatively stable at 12.3 down from 13.4 yesterday, but likely from CRRT.   Anti-infectives: Anti-infectives (From admission, onward)    Start     Dose/Rate Route Frequency Ordered Stop   08/28/23 1100  cefTRIAXone  (ROCEPHIN ) 1 g in sodium chloride  0.9 % 100 mL IVPB        1 g 200 mL/hr over 30 Minutes Intravenous Every 24 hours 08/28/23 0900         Current Facility-Administered Medications  Medication Dose Route Frequency Provider Last Rate Last Admin   0.9 %  sodium chloride  infusion   Intravenous Continuous Trudy Anthony HERO, MD 75 mL/hr at 08/29/23 0700 Infusion Verify at 08/29/23 0700   acetaminophen  (TYLENOL ) tablet 325 mg  325 mg Oral Q6H PRN Trudy Anthony HERO, MD   325 mg at 08/28/23 1015   ALPRAZolam  (XANAX ) tablet 0.5 mg  0.5 mg Oral QHS PRN Niu, Xilin, MD       bisacodyl  (DULCOLAX) suppository 10 mg  10 mg Rectal BID Nelwyn Bristle, NP       cefTRIAXone  (ROCEPHIN ) 1 g in sodium chloride  0.9 % 100 mL IVPB  1 g Intravenous Q24H Trudy Anthony M, MD 200 mL/hr at 08/29/23 0200 Infusion Verify at 08/29/23 0200   Chlorhexidine  Gluconate Cloth 2 % PADS 6 each  6 each Topical Daily Trudy Anthony HERO, MD   6 each at 08/28/23 9070   diltiazem  (CARDIZEM  CD) 24 hr capsule 240 mg  240 mg Oral Daily Nelwyn Bristle, NP       gabapentin  (NEURONTIN ) capsule 800 mg  800 mg Oral BID Niu, Xilin, MD   800 mg at 08/28/23 2131   heparin  injection 1,000-6,000 Units  1,000-6,000 Units CRRT PRN Kolluru, Woodward, MD       hydrALAZINE  (APRESOLINE ) injection 10 mg  10 mg Intravenous Q4H PRN Duncan, Hazel V, MD   10 mg at 08/27/23 0831   insulin  aspart  (novoLOG ) injection 0-9 Units  0-9 Units Subcutaneous Q4H Nelson, Dana G, NP   2 Units at 08/29/23 0403   isosorbide  mononitrate (IMDUR ) 24 hr tablet 30 mg  30 mg Oral Daily Nelwyn Bristle, NP       latanoprost  (XALATAN ) 0.005 % ophthalmic solution 1 drop  1 drop Both Eyes QHS Niu, Xilin, MD   1 drop at 08/28/23 2131   montelukast  (SINGULAIR ) tablet 10 mg  10 mg Oral QHS Nelwyn Bristle, NP       nitroGLYCERIN  (NITROSTAT ) SL tablet 0.4 mg  0.4 mg Sublingual PRN Niu, Xilin, MD       ondansetron  (ZOFRAN ) injection 4 mg  4 mg Intravenous Q8H PRN Niu, Xilin, MD   4 mg at 08/27/23 0844   oxyCODONE  (Oxy IR/ROXICODONE ) immediate release tablet 10 mg  10 mg Oral Q6H PRN Trudy Anthony HERO, MD   10 mg at 08/29/23 0315   patiromer  (VELTASSA ) packet 25.2 g  25.2 g Oral Daily Lateef, Munsoor, MD       polyethylene glycol (MIRALAX  / GLYCOLAX ) packet 17 g  17 g Oral Once Nelwyn Bristle, NP       PrismaSol  BGK 2/3.5 infusion   CRRT Continuous Douglas Woodward, MD  400 mL/hr at 08/28/23 2257 New Bag at 08/28/23 2257   PrismaSol  BGK 2/3.5 infusion   CRRT Continuous Kolluru, Sarath, MD 400 mL/hr at 08/28/23 2304 New Bag at 08/28/23 2304   PrismaSol  BGK 2/3.5 infusion   CRRT Continuous Douglas Rule, MD 2,000 mL/hr at 08/29/23 0622 New Bag at 08/29/23 0622   rosuvastatin  (CRESTOR ) tablet 40 mg  40 mg Oral QHS Niu, Xilin, MD   40 mg at 08/28/23 2300   senna (SENOKOT) tablet 17.2 mg  2 tablet Oral Daily Nelwyn Bristle, NP       sodium chloride  0.9 % primer fluid for CRRT   CRRT PRN Kolluru, Rule, MD       sodium chloride  irrigation 0.9 % 3,000 mL  3,000 mL Irrigation Continuous Niu, Xilin, MD   3,000 mL at 08/27/23 1722   sodium chloride  irrigation 0.9 % 3,000 mL  3,000 mL Irrigation Continuous Stoioff, Scott C, MD   3,000 mL at 08/29/23 0100     Objective: Vital signs in last 24 hours: Temp:  [98.3 F (36.8 C)-101.4 F (38.6 C)] 98.7 F (37.1 C) (07/11 0400) Pulse Rate:  [55-108] 74 (07/11  0700) Resp:  [13-30] 25 (07/11 0700) BP: (86-151)/(58-92) 121/68 (07/11 0700) SpO2:  [91 %-100 %] 95 % (07/11 0700) FiO2 (%):  [30 %-35 %] 30 % (07/10 2016) Weight:  [93 kg-93.7 kg] 93.7 kg (07/11 0417)  Intake/Output from previous day: 07/10 0701 - 07/11 0700 In: 9641.6 [I.V.:1641.3; IV Piggyback:200.3] Out: 88393 [Urine:10970] Intake/Output this shift: No intake/output data recorded.   Physical Exam Vitals and nursing note reviewed. Exam conducted with a chaperone present.  Constitutional:      Comments: Somnolent, but arousable to stimuli.  He remains confused.  HENT:     Head: Normocephalic and atraumatic.     Nose: Nose normal.     Mouth/Throat:     Mouth: Mucous membranes are moist.  Pulmonary:     Effort: Pulmonary effort is normal.  Abdominal:     General: Abdomen is flat.     Palpations: Abdomen is soft.  Genitourinary:    Penis: Normal.   Skin:    General: Skin is warm.  Neurological:     Mental Status: He is disoriented.     Lab Results:  Recent Labs    08/28/23 1546 08/28/23 1950 08/29/23 0420  WBC 13.4*  --  11.4*  HGB 12.9* 13.4 12.3*  HCT 39.9 40.2 38.2*  PLT 165  --  183   BMET Recent Labs    08/28/23 2230 08/29/23 0422  NA 137 137  K 5.2* 4.3  CL 99 101  CO2 25 21*  GLUCOSE 267* 164*  BUN 61* 45*  CREATININE 7.84* 5.76*  CALCIUM  8.5* 8.9   PT/INR Recent Labs    08/26/23 1639  LABPROT 13.5  INR 1.0   ABG Recent Labs    08/28/23 1540 08/28/23 1820 08/28/23 2230  PHART 7.26* 7.3*  --   HCO3 21.1 21.6 25.8    Studies/Results: DG Chest Port 1 View Result Date: 08/28/2023 CLINICAL DATA:  Central line placement EXAM: PORTABLE CHEST 1 VIEW COMPARISON:  08/28/2023, 12/11/2020, CT 07/23/2023 FINDINGS: Hypoventilatory changes. New right IJ central venous catheter tip at the right atrium. Cardiomegaly with vascular congestion and diffuse interstitial and hazy pulmonary opacity, probably due to edema. No pneumothorax. Hardware in  the cervical spine IMPRESSION: 1. New right IJ central venous catheter tip at the right atrium. No pneumothorax. 2. Cardiomegaly with vascular congestion and  diffuse interstitial and hazy pulmonary opacity, probably due to edema. Electronically Signed   By: Luke Bun M.D.   On: 08/28/2023 22:00   DG Abd 1 View Result Date: 08/28/2023 CLINICAL DATA:  Abdominal distension EXAM: ABDOMEN - 1 VIEW COMPARISON:  02/09/2020 FINDINGS: Mild gaseous distension of the colon. No evidence of bowel obstruction. Moderate stool burden. No organomegaly or free air. Right ureteral stent in place. IMPRESSION: Mild gaseous distention of the colon likely reflects mild ileus. Moderate stool burden. Electronically Signed   By: Franky Crease M.D.   On: 08/28/2023 19:09   DG Chest Port 1 View Result Date: 08/28/2023 CLINICAL DATA:  Dyspnea. EXAM: PORTABLE CHEST 1 VIEW COMPARISON:  December 11, 2020. FINDINGS: Stable cardiomediastinal silhouette. Left lung is clear. Minimal right basilar subsegmental atelectasis is noted. Bony thorax is unremarkable. IMPRESSION: Minimal right basilar subsegmental atelectasis. Electronically Signed   By: Lynwood Landy Raddle M.D.   On: 08/28/2023 16:32   DG OR UROLOGY CYSTO IMAGE ([facility] ONLY) Result Date: 08/28/2023 There is no interpretation for this exam.  This order is for images obtained during a surgical procedure.  Please See Surgeries Tab for more information regarding the procedure.   US  PELVIS LIMITED (TRANSABDOMINAL ONLY) Result Date: 08/27/2023 CLINICAL DATA:  Gross hematuria EXAM: LIMITED ULTRASOUND OF PELVIS TECHNIQUE: Limited transabdominal ultrasound examination of the pelvis was performed. COMPARISON:  CT 08/26/2023 FINDINGS: Urinary bladder is decompressed by Foley catheter. Pre-void volume: None significant Other findings:  None. IMPRESSION: Urinary bladder is completely decompressed by Foley catheter. Electronically Signed   By: Luke Bun M.D.   On: 08/27/2023 19:22      Assessment: 68 year old male with past medical history of left RCC status post nephrectomy with solitary right kidney managed by Dr. Renda, diabetes on Jardiance and CAD Effient  who was admitted with gross hematuria clot retention requiring CBI.     Underwent cystoscopy, right retrograde and right ureteral stent placement yesterday (08/28/2023) with Dr. Twylla.  Cystoscopic findings were for mild to moderate lateral lobe enlargement and moderate bladder neck elevation with scant clot debris in the bladder and no efflux seen from the right UO.  The right retrograde noted a normal caliber ureter without dilation or filling defect with moderate right hydronephrosis with filling defects in the renal pelvis calyces most likely secondary to clot.  Saline barbotage of the right renal pelvic was performed and sent for cytology.  A right ureteral stent was placed.    -cytology pending   -urine culture pending   -Preliminary blood cultures with no growth   -CBI turned off at 0300, urine efflux has remained clear orange pink  -Hemoglobin is decreased, but likely to CRRT  Plan: - discontinue CBI - Continue broad-spectrum IV antibiotics - follow cultures and narrow antibiotic coverage as appropriate - follow cytology  - trend renal function - Continue analgesics as needed - monitor for worsening hematuria, although we can expect a degree of hematuria with a ureteral stent in place - irrigate Foley as needed to keep free of clots  - trend CBC - Continue to hold anticoagulant therapy until serum creatinine improves and he is off CRRT, we would like for him to make urine to be able to clear any residual clot material from his right kidney - will need outpatient follow up with Dr. Renda for stent management    LOS: 2 days    [facility] 08/29/2023

## 2023-08-29 NOTE — Progress Notes (Addendum)
 CRRT Filter changed due high filter pressure.

## 2023-08-29 NOTE — Plan of Care (Signed)
  Problem: Fluid Volume: Goal: Ability to maintain a balanced intake and output will improve Outcome: Progressing   Problem: Metabolic: Goal: Ability to maintain appropriate glucose levels will improve Outcome: Progressing   Problem: Nutritional: Goal: Maintenance of adequate nutrition will improve Outcome: Progressing   Problem: Skin Integrity: Goal: Risk for impaired skin integrity will decrease Outcome: Progressing   Problem: Tissue Perfusion: Goal: Adequacy of tissue perfusion will improve Outcome: Progressing   Problem: Coping: Goal: Level of anxiety will decrease Outcome: Progressing   Problem: Pain Managment: Goal: General experience of comfort will improve and/or be controlled Outcome: Progressing   Problem: Safety: Goal: Ability to remain free from injury will improve Outcome: Progressing

## 2023-08-30 ENCOUNTER — Inpatient Hospital Stay

## 2023-08-30 DIAGNOSIS — J9600 Acute respiratory failure, unspecified whether with hypoxia or hypercapnia: Secondary | ICD-10-CM | POA: Diagnosis not present

## 2023-08-30 DIAGNOSIS — R310 Gross hematuria: Secondary | ICD-10-CM | POA: Diagnosis not present

## 2023-08-30 DIAGNOSIS — G928 Other toxic encephalopathy: Secondary | ICD-10-CM

## 2023-08-30 DIAGNOSIS — N1832 Chronic kidney disease, stage 3b: Secondary | ICD-10-CM | POA: Diagnosis not present

## 2023-08-30 LAB — GLUCOSE, CAPILLARY
Glucose-Capillary: 236 mg/dL — ABNORMAL HIGH (ref 70–99)
Glucose-Capillary: 262 mg/dL — ABNORMAL HIGH (ref 70–99)
Glucose-Capillary: 278 mg/dL — ABNORMAL HIGH (ref 70–99)
Glucose-Capillary: 281 mg/dL — ABNORMAL HIGH (ref 70–99)
Glucose-Capillary: 330 mg/dL — ABNORMAL HIGH (ref 70–99)
Glucose-Capillary: 365 mg/dL — ABNORMAL HIGH (ref 70–99)

## 2023-08-30 LAB — CBC
HCT: 33.3 % — ABNORMAL LOW (ref 39.0–52.0)
Hemoglobin: 10.6 g/dL — ABNORMAL LOW (ref 13.0–17.0)
MCH: 26.9 pg (ref 26.0–34.0)
MCHC: 31.8 g/dL (ref 30.0–36.0)
MCV: 84.5 fL (ref 80.0–100.0)
Platelets: 136 K/uL — ABNORMAL LOW (ref 150–400)
RBC: 3.94 MIL/uL — ABNORMAL LOW (ref 4.22–5.81)
RDW: 15.4 % (ref 11.5–15.5)
WBC: 7.4 K/uL (ref 4.0–10.5)
nRBC: 0 % (ref 0.0–0.2)

## 2023-08-30 LAB — BASIC METABOLIC PANEL WITH GFR
Anion gap: 6 (ref 5–15)
BUN: 32 mg/dL — ABNORMAL HIGH (ref 8–23)
CO2: 28 mmol/L (ref 22–32)
Calcium: 8.4 mg/dL — ABNORMAL LOW (ref 8.9–10.3)
Chloride: 104 mmol/L (ref 98–111)
Creatinine, Ser: 2.89 mg/dL — ABNORMAL HIGH (ref 0.61–1.24)
GFR, Estimated: 23 mL/min — ABNORMAL LOW (ref >60)
Glucose, Bld: 305 mg/dL — ABNORMAL HIGH (ref 70–99)
Potassium: 4.3 mmol/L (ref 3.5–5.1)
Sodium: 138 mmol/L (ref 135–145)

## 2023-08-30 LAB — MAGNESIUM: Magnesium: 2.4 mg/dL (ref 1.7–2.4)

## 2023-08-30 LAB — URINE CULTURE: Culture: NO GROWTH

## 2023-08-30 LAB — HEPATITIS B SURFACE ANTIGEN: Hepatitis B Surface Ag: NONREACTIVE

## 2023-08-30 MED ORDER — METOPROLOL TARTRATE 5 MG/5ML IV SOLN
2.5000 mg | INTRAVENOUS | Status: DC | PRN
  Administered 2023-08-30: 2.5 mg via INTRAVENOUS
  Filled 2023-08-30: qty 5

## 2023-08-30 MED ORDER — INSULIN ASPART 100 UNIT/ML IJ SOLN
0.0000 [IU] | INTRAMUSCULAR | Status: DC
  Administered 2023-08-30: 8 [IU] via SUBCUTANEOUS
  Administered 2023-08-30: 5 [IU] via SUBCUTANEOUS
  Administered 2023-08-30: 11 [IU] via SUBCUTANEOUS
  Administered 2023-08-30: 15 [IU] via SUBCUTANEOUS
  Administered 2023-08-30: 8 [IU] via SUBCUTANEOUS
  Administered 2023-08-31: 11 [IU] via SUBCUTANEOUS
  Administered 2023-08-31 (×2): 3 [IU] via SUBCUTANEOUS
  Administered 2023-08-31: 11 [IU] via SUBCUTANEOUS
  Administered 2023-08-31 – 2023-09-01 (×2): 15 [IU] via SUBCUTANEOUS
  Administered 2023-09-01 (×2): 8 [IU] via SUBCUTANEOUS
  Administered 2023-09-01: 5 [IU] via SUBCUTANEOUS
  Administered 2023-09-01 (×2): 15 [IU] via SUBCUTANEOUS
  Administered 2023-09-02 (×2): 3 [IU] via SUBCUTANEOUS
  Administered 2023-09-02 (×2): 8 [IU] via SUBCUTANEOUS
  Administered 2023-09-02: 3 [IU] via SUBCUTANEOUS
  Administered 2023-09-02: 11 [IU] via SUBCUTANEOUS
  Administered 2023-09-02: 5 [IU] via SUBCUTANEOUS
  Filled 2023-08-30: qty 11
  Filled 2023-08-30: qty 1
  Filled 2023-08-30: qty 15
  Filled 2023-08-30: qty 1
  Filled 2023-08-30: qty 3
  Filled 2023-08-30: qty 15
  Filled 2023-08-30: qty 5
  Filled 2023-08-30: qty 11
  Filled 2023-08-30: qty 15
  Filled 2023-08-30: qty 11
  Filled 2023-08-30 (×4): qty 1
  Filled 2023-08-30: qty 8
  Filled 2023-08-30: qty 15
  Filled 2023-08-30 (×2): qty 8
  Filled 2023-08-30: qty 3
  Filled 2023-08-30: qty 1
  Filled 2023-08-30: qty 15
  Filled 2023-08-30: qty 8
  Filled 2023-08-30: qty 1

## 2023-08-30 MED ORDER — ALBUTEROL SULFATE (2.5 MG/3ML) 0.083% IN NEBU: 2.5000 mg | INHALATION_SOLUTION | RESPIRATORY_TRACT | Status: DC | PRN

## 2023-08-30 MED ORDER — INSULIN ASPART 100 UNIT/ML IJ SOLN: 0.0000 [IU] | INTRAMUSCULAR | Status: DC

## 2023-08-30 MED ORDER — ALBUTEROL SULFATE (2.5 MG/3ML) 0.083% IN NEBU: 2.5000 mg | INHALATION_SOLUTION | RESPIRATORY_TRACT | Status: DC

## 2023-08-30 NOTE — Consult Note (Signed)
 PHARMACY CONSULT NOTE - ELECTROLYTES  Pharmacy Consult for Electrolyte Monitoring and Replacement   Recent Labs: Potassium (mmol/L)  Date Value  08/30/2023 4.3  02/24/2014 3.9   Magnesium  (mg/dL)  Date Value  92/87/7974 2.4  05/14/2013 1.5 (L)   Calcium  (mg/dL)  Date Value  92/87/7974 8.4 (L)   Calcium , Total (mg/dL)  Date Value  98/92/7983 8.4 (L)   Albumin  (g/dL)  Date Value  92/88/7974 2.9 (L)  08/18/2013 3.8   Phosphorus (mg/dL)  Date Value  92/88/7974 4.1   Sodium (mmol/L)  Date Value  08/30/2023 138  02/24/2014 142   Height: 5' 9 (175.3 cm) Weight: 98.5 kg (217 lb 2.5 oz) IBW/kg (Calculated) : 70.7 Estimated Creatinine Clearance: 28.3 mL/min (A) (by C-G formula based on SCr of 2.89 mg/dL (H)).  Assessment  Kenneth Osborn is a 68 y.o. male presenting with gross hematuria with blood clots . PMH significant for left renal cell carcinoma s/p nephrectomy with solitary right kidney, type II diabetes mellitus, and CAD on effient  . Pharmacy has been consulted to monitor and replace electrolytes.  Diet: carb modified  MIVF: N/A Pertinent medications: N/A  Goal of Therapy: Electrolytes within normal limits  Plan:  No replacement indicated Continue to monitor renal function panel(s) BID while on CRRT  Thank you for allowing pharmacy to be a part of this patient's care.  Nysa Sarin A Blayton Huttner 08/30/2023 7:47 AM

## 2023-08-30 NOTE — Progress Notes (Signed)
 Spoke to Dr Dennise. Filter clotted, unable to return blood. Plans as of yesterday continue CRRT 1 additional day and reassess for additional need tomorrow  am labs sent off. Patient making 75cc to 200ccs of urine per hour. No vasoactive pressor support needed at this time. Ordered to stop CRRT now and will re-evaluate in am. ICU in need ICU beds.

## 2023-08-30 NOTE — Progress Notes (Signed)
 Patient was eating chicken sandwich when he pushed the call bell due to choking and vomiting. RN assisted/assessed patient. Patient has audible wheezing and gargling. RN notified Dr. Isaiah and chest xray, NPO status and speech consult ordered.

## 2023-08-30 NOTE — Plan of Care (Signed)
  Problem: Coping: Goal: Ability to adjust to condition or change in health will improve Outcome: Progressing   Problem: Fluid Volume: Goal: Ability to maintain a balanced intake and output will improve Outcome: Progressing   Problem: Metabolic: Goal: Ability to maintain appropriate glucose levels will improve Outcome: Progressing   Problem: Nutritional: Goal: Maintenance of adequate nutrition will improve Outcome: Progressing   Problem: Skin Integrity: Goal: Risk for impaired skin integrity will decrease Outcome: Progressing   Problem: Tissue Perfusion: Goal: Adequacy of tissue perfusion will improve Outcome: Progressing   Problem: Education: Goal: Knowledge of General Education information will improve Description: Including pain rating scale, medication(s)/side effects and non-pharmacologic comfort measures Outcome: Progressing   Problem: Clinical Measurements: Goal: Respiratory complications will improve Outcome: Progressing Goal: Cardiovascular complication will be avoided Outcome: Progressing

## 2023-08-30 NOTE — Progress Notes (Signed)
 [facility]  ROUNDING NOTE   Subjective:   Patient well-known to us  as we follow him in the office for chronic kidney disease in the setting of left nephrectomy.  He presented with gross hematuria.  He was found to have new right-sided hydronephrosis with hyperdense material in the right renal collecting system. He was taken for cystoscopy and right retrograde pyelography this admission on 7/10 by Dr Twylla. Right ureteral stent placed. Overnight CRRT dialyzer clotted.  Since urine output was improving and electrolytes were acceptable, CRRT was discontinued. Today he is sitting up in the chair.  Wife at bedside.  Trying to eat some breakfast. No complaints of shortness of breath. Continues to have Foley with blood-tinged urine.  Objective:  Vital signs in last 24 hours:  Temp:  [98.1 F (36.7 C)-99.6 F (37.6 C)] 98.1 F (36.7 C) (07/12 0800) Pulse Rate:  [78-103] 89 (07/12 0800) Resp:  [16-24] 22 (07/12 0800) BP: (105-150)/(62-108) 129/74 (07/12 0700) SpO2:  [90 %-97 %] 97 % (07/12 0800) Weight:  [98.5 kg] 98.5 kg (07/12 0420)  Weight change: 5.513 kg Filed Weights   08/28/23 1158 08/29/23 0417 08/30/23 0420  Weight: 93 kg 93.7 kg 98.5 kg    Intake/Output: I/O last 3 completed shifts: In: 8577.9 [P.O.:777; I.V.:1000.6; Other:6600; IV Piggyback:200.3] Out: 7474.6 [Urine:6700]   Intake/Output this shift:  Total I/O In: -  Out: 200 [Urine:200]  Physical Exam: General: No acute distress  Head: Normocephalic, atraumatic. Moist oral mucosal membranes  Neck: Supple  Lungs:  Clear to auscultation, normal effort, mild crackles at bases  Heart: S1S2 no rubs  Abdomen:  Soft, nontender, bowel sounds present  Extremities: 1+ peripheral edema.  Neurologic: Awake, alert, following commands  Skin: No acute rash  Access: Right IJ temporary hemodialysis catheter  Foley catheter in place  Basic Metabolic Panel: Recent Labs  Lab 08/28/23 1828 08/28/23 2230  08/29/23 0422 08/29/23 1534 08/30/23 0329  NA 134* 137 137 137 138  K 6.0* 5.2* 4.3 4.7 4.3  CL 100 99 101 101 104  CO2 19* 25 21* 26 28  GLUCOSE 280* 267* 164* 272* 305*  BUN 58* 61* 45* 42* 32*  CREATININE 7.55* 7.84* 5.76* 4.40* 2.89*  CALCIUM  8.2* 8.5* 8.9 8.6* 8.4*  MG  --   --  1.8  --  2.4  PHOS  --   --  4.7* 4.1  --     Liver Function Tests: Recent Labs  Lab 08/26/23 1639 08/28/23 1546 08/29/23 0422 08/29/23 1534  AST 50* 16  --   --   ALT 55* 30  --   --   ALKPHOS 92 77  --   --   BILITOT 0.6 0.6  --   --   PROT 7.4 6.5  --   --   ALBUMIN  3.7 3.0* 2.9* 2.9*   No results for input(s): LIPASE, AMYLASE in the last 168 hours. No results for input(s): AMMONIA in the last 168 hours.  CBC: Recent Labs  Lab 08/26/23 1639 08/27/23 0348 08/28/23 0505 08/28/23 1546 08/28/23 1950 08/29/23 0420 08/30/23 0329  WBC 7.7 10.6* 15.3* 13.4*  --  11.4* 7.4  NEUTROABS 5.1  --   --  11.5*  --   --   --   HGB 14.2 15.4 14.5 12.9* 13.4 12.3* 10.6*  HCT 45.0 47.4 44.2 39.9 40.2 38.2* 33.3*  MCV 83.2 80.7 80.1 80.8  --  82.2 84.5  PLT 237 210 211 165  --  183 136*  Cardiac Enzymes: No results for input(s): CKTOTAL, CKMB, CKMBINDEX, TROPONINI in the last 168 hours.  BNP: Invalid input(s): POCBNP  CBG: Recent Labs  Lab 08/29/23 1613 08/29/23 1954 08/29/23 2333 08/30/23 0450 08/30/23 0741  GLUCAP 263* 244* 260* 262* 236*    Microbiology: Results for orders placed or performed during the hospital encounter of 08/26/23  Culture, blood (Routine X 2) w Reflex to ID Panel     Status: None (Preliminary result)   Collection Time: 08/28/23  9:27 AM   Specimen: BLOOD  Result Value Ref Range Status   Specimen Description BLOOD BLOOD RIGHT HAND  Final   Special Requests   Final    BOTTLES DRAWN AEROBIC AND ANAEROBIC Blood Culture adequate volume   Culture   Final    NO GROWTH 2 DAYS Performed at [facility], [address]  [place], [place] [address]    Report Status PENDING  Incomplete  Culture, blood (Routine X 2) w Reflex to ID Panel     Status: None (Preliminary result)   Collection Time: 08/28/23  9:32 AM   Specimen: BLOOD  Result Value Ref Range Status   Specimen Description BLOOD BLOOD LEFT HAND  Final   Special Requests   Final    BOTTLES DRAWN AEROBIC AND ANAEROBIC Blood Culture adequate volume   Culture   Final    NO GROWTH 2 DAYS Performed at [facility], [address] [place], [place] [address]    Report Status PENDING  Incomplete  Urine Culture     Status: None   Collection Time: 08/28/23 12:41 PM   Specimen: PATH Cytology Urine  Result Value Ref Range Status   Specimen Description   Final    URINE, RANDOM Performed at [facility], [address], [place], [place] [address]    Special Requests   Final     CYTO URINE Performed at [facility], [address], [place], [place] [address]    Culture   Final    NO GROWTH Performed at [facility], [address] [place], [place] [address]    Report Status 08/30/2023 FINAL  Final    Coagulation Studies: No results for input(s): LABPROT, INR in the last 72 hours.   Urinalysis: No results for input(s): COLORURINE, LABSPEC, PHURINE, GLUCOSEU, HGBUR, BILIRUBINUR, KETONESUR, PROTEINUR, UROBILINOGEN, NITRITE, LEUKOCYTESUR in the last 72 hours.  Invalid input(s): APPERANCEUR     Imaging: ECHOCARDIOGRAM COMPLETE Result Date: 08/29/2023    ECHOCARDIOGRAM REPORT   Patient Name:   Kenneth Osborn Date of Exam: 08/29/2023 Medical Rec #:  [id number]       Height:       69.0 in Accession #:    [id number]      Weight:       206.6 lb Date of Birth:  1955/05/08       BSA:          2.095 m Patient Age:    68 years        BP:           118/95 mmHg Patient Gender: M               HR:           94 bpm. Exam Location:  [facility] Procedure: 2D Echo, Cardiac Doppler and Color Doppler (Both Spectral  and Color            Flow Doppler were utilized during procedure). Indications:  Abnormal ECG R94.31  History:         Patient has no prior history of Echocardiogram examinations.                  Risk Factors:Diabetes and Hypertension. Anginal pain.  Sonographer:     Christopher Furnace Referring Phys:  [id number] LONELL KANDICE MOOSE Diagnosing Phys: Redell Cave MD  Sonographer Comments: Technically challenging study due to limited acoustic windows, no apical window and no subcostal window. IMPRESSIONS  1. Left ventricular ejection fraction, by estimation, is 60 to 65%. The left ventricle has normal function. The left ventricle has no regional wall motion abnormalities. There is mild left ventricular hypertrophy. Left ventricular diastolic parameters are indeterminate.  2. Right ventricular systolic function is normal. The right ventricular size is not well visualized.  3. The mitral valve is degenerative. No evidence of mitral valve regurgitation.  4. The aortic valve is calcified. Aortic valve regurgitation is not visualized. Aortic valve sclerosis/calcification is present, without any evidence of aortic stenosis. FINDINGS  Left Ventricle: Left ventricular ejection fraction, by estimation, is 60 to 65%. The left ventricle has normal function. The left ventricle has no regional wall motion abnormalities. The left ventricular internal cavity size was normal in size. There is  mild left ventricular hypertrophy. Left ventricular diastolic parameters are indeterminate. Right Ventricle: The right ventricular size is not well visualized. No increase in right ventricular wall thickness. Right ventricular systolic function is normal. Left Atrium: Left atrial size was normal in size. Right Atrium: Right atrial size was not well visualized. Pericardium: There is no evidence of pericardial effusion. Mitral Valve: The mitral valve is degenerative in appearance. There is mild calcification of the mitral valve leaflet(s). No evidence of  mitral valve regurgitation. Tricuspid Valve: The tricuspid valve is normal in structure. Tricuspid valve regurgitation is mild. Aortic Valve: The aortic valve is calcified. Aortic valve regurgitation is not visualized. Aortic valve sclerosis/calcification is present, without any evidence of aortic stenosis. Pulmonic Valve: The pulmonic valve was not well visualized. Pulmonic valve regurgitation is not visualized. Aorta: The aortic root is normal in size and structure. IAS/Shunts: No atrial level shunt detected by color flow Doppler.  LEFT VENTRICLE PLAX 2D LVIDd:         4.10 cm LVIDs:         2.30 cm LV PW:         0.90 cm LV IVS:        1.60 cm  LEFT ATRIUM         Index LA diam:    3.80 cm 1.81 cm/m   AORTA Ao Root diam: 3.20 cm Redell Cave MD Electronically signed by Redell Cave MD Signature Date/Time: 08/29/2023/12:42:06 PM    Final    DG Chest Port 1 View Result Date: 08/28/2023 CLINICAL DATA:  Central line placement EXAM: PORTABLE CHEST 1 VIEW COMPARISON:  08/28/2023, 12/11/2020, CT 07/23/2023 FINDINGS: Hypoventilatory changes. New right IJ central venous catheter tip at the right atrium. Cardiomegaly with vascular congestion and diffuse interstitial and hazy pulmonary opacity, probably due to edema. No pneumothorax. Hardware in the cervical spine IMPRESSION: 1. New right IJ central venous catheter tip at the right atrium. No pneumothorax. 2. Cardiomegaly with vascular congestion and diffuse interstitial and hazy pulmonary opacity, probably due to edema. Electronically Signed   By: Luke Bun M.D.   On: 08/28/2023 22:00   DG Abd 1 View Result Date: 08/28/2023 CLINICAL DATA:  Abdominal distension EXAM: ABDOMEN - 1 VIEW COMPARISON:  02/09/2020 FINDINGS: Mild gaseous distension of the colon. No evidence of bowel obstruction. Moderate stool burden. No organomegaly or free air. Right ureteral stent in place. IMPRESSION: Mild gaseous distention of the colon likely reflects mild ileus. Moderate  stool burden. Electronically Signed   By: Franky Crease M.D.   On: 08/28/2023 19:09   DG Chest Port 1 View Result Date: 08/28/2023 CLINICAL DATA:  Dyspnea. EXAM: PORTABLE CHEST 1 VIEW COMPARISON:  December 11, 2020. FINDINGS: Stable cardiomediastinal silhouette. Left lung is clear. Minimal right basilar subsegmental atelectasis is noted. Bony thorax is unremarkable. IMPRESSION: Minimal right basilar subsegmental atelectasis. Electronically Signed   By: Lynwood Landy Raddle M.D.   On: 08/28/2023 16:32   DG OR UROLOGY CYSTO IMAGE ([facility] ONLY) Result Date: 08/28/2023 There is no interpretation for this exam.  This order is for images obtained during a surgical procedure.  Please See Surgeries Tab for more information regarding the procedure.     Medications:    cefTRIAXone  (ROCEPHIN )  IV 1 g (08/29/23 1059)   prismasol  BGK 4/2.5 400 mL/hr at 08/29/23 2317   prismasol  BGK 4/2.5 400 mL/hr at 08/29/23 2317   prismasol  BGK 4/2.5 2,000 mL/hr at 08/30/23 0212   sodium chloride  irrigation      bisacodyl   10 mg Rectal BID   Chlorhexidine  Gluconate Cloth  6 each Topical Daily   diltiazem   240 mg Oral Daily   feeding supplement  237 mL Oral TID BM   gabapentin   300 mg Oral BID   insulin  aspart  0-15 Units Subcutaneous Q4H   isosorbide  mononitrate  30 mg Oral Daily   latanoprost   1 drop Both Eyes QHS   lubiprostone   24 mcg Oral BID WC   montelukast   10 mg Oral QHS   multivitamin  1 tablet Oral QHS   rosuvastatin   40 mg Oral QHS   senna  2 tablet Oral Daily   acetaminophen , ALPRAZolam , heparin , hydrALAZINE , nitroGLYCERIN , ondansetron  (ZOFRAN ) IV, oxyCODONE , sodium chloride   Assessment/ Plan:  68 y.o. male with past medical history of renal cell carcinoma status post left nephrectomy, chronic kidney disease stage IIIb baseline EGFR 38, coronary disease with history of stent placement on Effient , hypertension, lipidemia, diabetes mellitus type 2, anxiety, chronic back pain, Mnire's disease who  presented with hematuria and status post cystoscopy, right ureteral stent placement, and retrograde pyelography.  1.  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  The patient's acute kidney injury appears to be secondary to new right-sided hydronephrosis which was due to right renal bleeding.  Patient status post cystoscopy and right retrograde pyelography on 08/28/2023.  Given severe hyperkalemia patient was started on CRRT.  Renal parameters and urine output have improved.  CRRT discontinued early in the morning.  Urine output reported at about 2000 cc yesterday and ate 75 cc today so far. No indication for dialysis at present.  We will continue to monitor  2.  Hyperkalemia.   Potassium level has corrected and is now in the normal range.  3.  Type 2 diabetes with CKD Hemoglobin A1c 7.4% from 11/01/2021     LOS: 3 Katherine Tout 7/12/20258:23 AM

## 2023-08-30 NOTE — Progress Notes (Addendum)
 NAME:  ARLAND USERY, MRN:  [id number], DOB:  [birth year], LOS: 3 ADMISSION DATE:  08/26/2023, CONSULTATION DATE: 08/28/23 REFERRING MD: Dr. Trudy, CHIEF COMPLAINT: Gross Hematuria   History of Present Illness:  This is a 68 yo male with a PMH of left renal cell carcinoma s/p nephrectomy with solitary right kidney, type II diabetes mellitus, and CAD on effient .  He presented to [facility] ER on 07/08 with gross hematuria with blood clots onset the morning of 07/08.  Pt reported he also had difficulty initiating a urine stream along with worsening pain.    ED Course  Upon arrival to the ER significant lab results were: glucose 281/BUN 28/creatinine 1.79/AST 50/ALT 55. UA revealed >50 rbc's, but no bacteria present.  CT Abd/Pelvis W Contrast revealed hyperdense material in the right renal collecting system, suspect for hemorrhagic material and vague hyperdense masslike area in the posterior bladder, probably represents hemorrhagic material/hematoma.   Urology contacted by ER provider recommended placing three-way foley catheter and start bladder irrigation to help with passage of blood clots.  Pt admitted to the telemetry unit per hospitalist team for additional workup and treatment.  See detailed hospital course below under significant events.    Pertinent  Medical History  Anginal Pain  Anxiety  Bronchitis  Left Renal Cell Carcinoma s/p Nephrectomy  CAD on Effient   Type II Diabetes Mellitus (insulin  dependent) Dysphagia  GERD  Hypercholesteremia  HTN  MI  Short Segment Barrett's Esophagus  OSA  CKD stage IIIB  Significant Hospital Events: Including procedures, antibiotic start and stop dates in addition to other pertinent events   07/08: Pt admitted with acute kidney injury superimposed on CKD stage IIIB with left nephrectomy secondary to obstructive uropathy in the setting of gross hematuria with clot retention requiring CBI  07/09: Urology ordered pelvic US  which showed no significant  residual clot burden in the bladder  07/10: Pt developed worsening renal failure with hyperkalemia.  Pt underwent cystoscopy, right ureteral stent placement, and right retrograde pyelography with interpretation.  Postop pt developed acute encephalopathy and metabolic acidosis requiring Bipap and transfer to the stepdown unit.  PCCM team consulted to assist with management  7/11: Started on CRRT last night. Increased confusion, Atarax  x 1. Urine clear/blood tinged, CBI held. Transitioned off Bipap to Nasal canula. KUB with mild ileus, and stool burden.  7/12: CRRT stopped @4am  per Nephrology  Interim History / Subjective:  7/10: BC x 2 NGTD 7/10: Urine culture NGTD Objective    Blood pressure 123/65, pulse 82, temperature 99.6 F (37.6 C), temperature source Oral, resp. rate 20, height 5' 9 (1.753 m), weight 98.5 kg, SpO2 93%.        Intake/Output Summary (Last 24 hours) at 08/30/2023 0439 Last data filed at 08/30/2023 0300 Gross per 24 hour  Intake 1193.73 ml  Output 2349.6 ml  Net -1155.87 ml   Filed Weights   08/28/23 1158 08/29/23 0417 08/30/23 0420  Weight: 93 kg 93.7 kg 98.5 kg   Examination: General: NAD HENT: Supple Lungs: Diminished throughout, intermittently tachypneic  Cardiovascular: NSR, no m/r/g, 2+ radial/2+ distal pulses, no edema  Abdomen: +BS x4, obese, distended, non tender  Extremities: Normal bulk and tone, moves all extremities  Neuro: Drowsy, restless, follows commands, PERRLA  GU: Indwelling 3-way foley in place with continuous bladder irrigation on hold, clear/blood tinged urine   Resolved problem list   Assessment and Plan   #Acute toxic metabolic encephalopathy ~Improving Hx: Anxiety and chronic back pain -Correct metabolic derangements  -  Avoid sedating medications as able  -Continue home Neurontin  800 mg BID, Xanax  PRN, Oxycodone  PRN  #Sinus bradycardia, resolved #Left bundle branch block  Hx: CAD on effient , HTN, MI, HLD, and  hypercholesteremia  -Continuous telemetry monitoring  -troponin 29 -Continue home Cardizem  and Imdur  today today -Holding home Chlorthalidone, and Metoprolol  due to bradycardia on admission, resume as tolerated -Resume Home Crestor  once able to tolerate -Hold outpatient effient   -Maintain map 65 or higher   #Acute respiratory failure suspect secondary to sedating medication, improving  #Metabolic acidosis, improving  Hx: OSA  -Bipap or CPAP at bedtime, tolerating Nasal canula this morning  -Maintain O2 sats 92% or higher  -Intermittent CXR and ABG's  -Continue home Singulair   #Acute kidney injury superimposed on CKD stage IIIB with hx of left renal cell carcinoma requiring left nephrectomy secondary to obstructive uropathy in the setting of gross hematuria with clot retention s/p cystoscopy and right ureteral stent placement  #Metabolic acidosis, improving  #Hyperkalemia, resolved #Hyponatremia, resolved  -Trend BMP  -Replace electrolytes as indicated  -Strict I&O's  -Avoid nephrotoxic agents as able  -Urology consulted appreciate input: continue continuous bladder irrigation on hold this morning -CRRT stopped per Nephrology  #Acute blood loss anemia secondary to gross hematuria  Hgb stable this am -Daily CBC  -Monitor for s/sx of bleeding  -Transfuse for hgb <7 -Hold outpatient effient  and avoid chemical VTE px; SCD's for VTE px   #Mild leukocytosis secondary to possible UTI  -Trend WBC and monitor fever curve  -Follow cultures as above -Continue Ceftriaxone  for now pending culture results and sensitivities   #Abdominal distension  #Mild Ileus -KUB Mild gaseous distention of the colon likely reflects mild ileus, Moderate stool burden -Increase bowel regimen, suppository daily  #Type II diabetes mellitus  -CBG's q4hrs  -SSI  -Hold home Metformin  and Jardiance -Follow hypo/hyperglycemic protocol  -Target CBG 140 to 180   Best Practice (right click and Reselect all  SmartList Selections daily)   Diet/type: NPO DVT prophylaxis SCD, holding SQH and Effient  due to hematuria Pressure ulcer(s): N/A GI prophylaxis: N/A Lines: RIJ Dialysis catheter  Foley:  Yes, and it is still needed Code Status:  full code  Labs   CBC: Recent Labs  Lab 08/26/23 1639 08/27/23 0348 08/28/23 0505 08/28/23 1546 08/28/23 1950 08/29/23 0420 08/30/23 0329  WBC 7.7 10.6* 15.3* 13.4*  --  11.4* 7.4  NEUTROABS 5.1  --   --  11.5*  --   --   --   HGB 14.2 15.4 14.5 12.9* 13.4 12.3* 10.6*  HCT 45.0 47.4 44.2 39.9 40.2 38.2* 33.3*  MCV 83.2 80.7 80.1 80.8  --  82.2 84.5  PLT 237 210 211 165  --  183 136*   Basic Metabolic Panel: Recent Labs  Lab 08/28/23 1828 08/28/23 2230 08/29/23 0422 08/29/23 1534 08/30/23 0329  NA 134* 137 137 137 138  K 6.0* 5.2* 4.3 4.7 4.3  CL 100 99 101 101 104  CO2 19* 25 21* 26 28  GLUCOSE 280* 267* 164* 272* 305*  BUN 58* 61* 45* 42* 32*  CREATININE 7.55* 7.84* 5.76* 4.40* 2.89*  CALCIUM  8.2* 8.5* 8.9 8.6* 8.4*  MG  --   --  1.8  --  2.4  PHOS  --   --  4.7* 4.1  --    GFR: Estimated Creatinine Clearance: 28.3 mL/min (A) (by C-G formula based on SCr of 2.89 mg/dL (H)). Recent Labs  Lab 08/28/23 0505 08/28/23 1546 08/28/23 1950 08/29/23 0420  08/30/23 0329  WBC 15.3* 13.4*  --  11.4* 7.4  LATICACIDVEN  --  0.9 1.2  --   --    Liver Function Tests: Recent Labs  Lab 08/26/23 1639 08/28/23 1546 08/29/23 0422 08/29/23 1534  AST 50* 16  --   --   ALT 55* 30  --   --   ALKPHOS 92 77  --   --   BILITOT 0.6 0.6  --   --   PROT 7.4 6.5  --   --   ALBUMIN  3.7 3.0* 2.9* 2.9*   No results for input(s): LIPASE, AMYLASE in the last 168 hours. No results for input(s): AMMONIA in the last 168 hours.  ABG    Component Value Date/Time   PHART 7.3 (L) 08/28/2023 1820   PCO2ART 44 08/28/2023 1820   PO2ART 70 (L) 08/28/2023 1820   HCO3 25.8 08/28/2023 2230   TCO2 25 04/20/2020 1532   ACIDBASEDEF 1.8 08/28/2023 2230    O2SAT 67.9 08/28/2023 2230    Coagulation Profile: Recent Labs  Lab 08/26/23 1639  INR 1.0   Cardiac Enzymes: No results for input(s): CKTOTAL, CKMB, CKMBINDEX, TROPONINI in the last 168 hours.  HbA1C: Hgb A1c MFr Bld  Date/Time Value Ref Range Status  11/01/2021 08:32 PM 7.4 (H) 4.8 - 5.6 % Final    Comment:    (NOTE) Pre diabetes:          5.7%-6.4%  Diabetes:              >6.4%  Glycemic control for   <7.0% adults with diabetes   04/20/2020 09:08 PM 7.2 (H) 4.8 - 5.6 % Final    Comment:    (NOTE) Pre diabetes:          5.7%-6.4%  Diabetes:              >6.4%  Glycemic control for   <7.0% adults with diabetes     CBG: Recent Labs  Lab 08/29/23 0739 08/29/23 1132 08/29/23 1613 08/29/23 1954 08/29/23 2333  GLUCAP 159* 219* 263* 244* 260*    Review of Systems:   Unable to assess due to acute encephalopathy   Past Medical History:  He,  has a past medical history of Acute postoperative pain (12/03/2018), Allergic rhinitis (12/30/2012), Anemia, Anginal pain (HCC), Anxiety, Bronchitis, Can't get food down (08/12/2014), Cancer (HCC) (01/2020), Chronic back pain, Concussion (09/2015), Coronary artery disease, DDD (degenerative disc disease), cervical, Dehydration symptoms, Diabetes mellitus without complication (HCC), Dysphagia, GERD (gastroesophageal reflux disease), History of Meniere's disease (12/21/2014), History of thoracic spine surgery (S/P T9-10 IVD spacer) (12/21/2014), Hypercalcemia, Hypercholesteremia, Hyperkalemia, Hyperlipidemia, Hypertension, Meniere's disease, Myocardial infarction (HCC) (2012), Neuromuscular disorder (HCC), Proteinuria, Renal cell carcinoma (HCC), Renal disorder, Secondary hyperparathyroidism of renal origin (HCC), Severe sepsis (HCC) (02/07/2020), Short-segment Barrett's esophagus, and Sleep apnea.   Surgical History:   Past Surgical History:  Procedure Laterality Date   APPENDECTOMY     age 85   ARTHRODESIS ANTERIOR  ANTERIOR CERVICLE SPINE  01/04/2013   BACK SURGERY     fusion thoracic area   CARDIAC CATHETERIZATION     may 2012 and Nov 20, 2010   CARDIAC CATHETERIZATION N/A 06/15/2015   Procedure: Left Heart Cath and Coronary Angiography;  Surgeon: Wolm JINNY Rhyme, MD;  Location: [facility] INVASIVE CV LAB;  Service: Cardiovascular;  Laterality: N/A;   CARDIAC CATHETERIZATION N/A 06/15/2015   Procedure: Coronary Stent Intervention;  Surgeon: Marsa Dooms, MD;  Location: [facility] INVASIVE CV LAB;  Service: Cardiovascular;  Laterality: N/A;   CARDIOVASCULAR STRESS TEST     jan 2014   COLONOSCOPY WITH PROPOFOL  N/A 09/19/2014   Procedure: COLONOSCOPY WITH PROPOFOL ;  Surgeon: Lamar ONEIDA Holmes, MD;  Location: [facility] ENDOSCOPY;  Service: Endoscopy;  Laterality: N/A;   COLONOSCOPY WITH PROPOFOL  N/A 04/03/2021   Procedure: COLONOSCOPY WITH PROPOFOL ;  Surgeon: Maryruth Ole ONEIDA, MD;  Location: [facility] ENDOSCOPY;  Service: Endoscopy;  Laterality: N/A;  IDDM   COLONOSCOPY WITH PROPOFOL  N/A 06/25/2023   Procedure: COLONOSCOPY WITH PROPOFOL ;  Surgeon: Toledo, Ladell POUR, MD;  Location: [facility] ENDOSCOPY;  Service: Gastroenterology;  Laterality: N/A;  IDDM, 1ST CASE   CORONARY ANGIOPLASTY     stent placement x3   CYSTOSCOPY W/ URETERAL STENT PLACEMENT Right 08/28/2023   Procedure: CYSTOSCOPY, WITH RETROGRADE PYELOGRAM AND URETERAL STENT INSERTION;  Surgeon: Twylla Glendia BROCKS, MD;  Location: [facility] ORS;  Service: Urology;  Laterality: Right;  RIGHT STENT PLACEMENT, right RETROGRADE PYELOGRAM   ESOPHAGOGASTRODUODENOSCOPY N/A 09/19/2014   Procedure: ESOPHAGOGASTRODUODENOSCOPY (EGD);  Surgeon: Lamar ONEIDA Holmes, MD;  Location: [facility] ENDOSCOPY;  Service: Endoscopy;  Laterality: N/A;   IR PERC CHOLECYSTOSTOMY  02/09/2020   IR RADIOLOGIST EVAL & MGMT  03/14/2020   LABRINTHECTOMY     1999 right ear   LUMBAR SPINAL CORD SIMULATOR LEAD REMOVAL Right 08/09/2019   Procedure: REMOVAL SPINAL CORD STIMULATOR PERCUTANEOUS LEADS, REMOVAL PULSE  GENERATOR;  Surgeon: Bluford Standing, MD;  Location: [facility] ORS;  Service: Neurosurgery;  Laterality: Right;  LOCAL WITH MAC   mastoid shunt Bilateral    left, 2002, 1997 right ear, 1980 right ear   NEPHRECTOMY Left    PULSE GENERATOR IMPLANT N/A 01/18/2019   Procedure: MEDTRONIC SPINAL CORD STIMULATOR BATTERY EXCHANGE;  Surgeon: Bluford Standing, MD;  Location: [facility] ORS;  Service: Neurosurgery;  Laterality: N/A;   ROBOT ASSISTED LAPAROSCOPIC NEPHRECTOMY Left 04/20/2020   Procedure: XI ROBOTIC ASSITED LAPAROSCOPIC NEPHRECTOMY;  Surgeon: Renda Glance, MD;  Location: [facility] ORS;  Service: Urology;  Laterality: Left;   SAVORY DILATION N/A 09/19/2014   Procedure: SAVORY DILATION;  Surgeon: Lamar ONEIDA Holmes, MD;  Location: [facility] ENDOSCOPY;  Service: Endoscopy;  Laterality: N/A;   SHOULDER ARTHROSCOPY WITH SUBACROMIAL DECOMPRESSION Left 04/06/2012   Procedure: SHOULDER ARTHROSCOPY WITH SUBACROMIAL DECOMPRESSION;  Surgeon: Marcey Raman, MD;  Location: [facility] OR;  Service: Orthopedics;  Laterality: Left;  left shoulder arthroscopy, subacromial decompression and distal clavicle resection   SPINAL CORD STIMULATOR IMPLANT Right      Social History:   reports that he has never smoked. He has never used smokeless tobacco. He reports that he does not drink alcohol and does not use drugs.   Family History:  His family history includes Cancer in his sister; Diabetes in his maternal grandmother, mother, and paternal grandmother; Heart disease in his father, maternal aunt, maternal uncle, and mother.   Allergies No Known Allergies   Home Medications  Prior to Admission medications   Medication Sig Start Date End Date Taking? Authorizing Provider  ALPRAZolam  (XANAX ) 0.5 MG tablet Take 1 tablet (0.5 mg total) by mouth at bedtime. Patient taking differently: Take 0.5 mg by mouth at bedtime. PRN 04/26/20  Yes Lue Elsie BROCKS, MD  cetirizine (ZYRTEC) 10 MG tablet Take 10 mg by mouth daily.    Yes [provider]   chlorthalidone (HYGROTON) 25 MG tablet Take 25 mg by mouth daily. 11/18/22  Yes [provider]  cyclobenzaprine  (FLEXERIL ) 10 MG tablet Take 1 tablet (10 mg total) by mouth 3 (three) times daily as needed for muscle  spasms. 04/26/20  Yes Lue Elsie BROCKS, MD  diazepam  (VALIUM ) 5 MG tablet Take 5 mg by mouth 3 (three) times daily as needed. 06/03/22  Yes [provider]  diltiazem  (CARDIZEM  CD) 240 MG 24 hr capsule Take 240 mg by mouth daily. 03/30/18  Yes [provider]  esomeprazole (NEXIUM) 40 MG capsule Take 40 mg by mouth 2 (two) times daily.   Yes [provider]  ezetimibe  (ZETIA ) 10 MG tablet Take 10 mg by mouth daily. 03/26/17  Yes [provider]  gabapentin  (NEURONTIN ) 800 MG tablet Take 800 mg by mouth 2 (two) times daily.   Yes [provider]  Glucagon (BAQSIMI ONE PACK NA) Place 1 Dose into the nose daily as needed (severely low blood sugar).   Yes [provider]  HUMULIN  R U-500 KWIKPEN 500 UNIT/ML KwikPen Inject 65-75 Units into the skin See admin instructions. Sliding scale Can adjust according to blood sugar Home med 11/02/21  Yes Awanda City, MD  isosorbide  mononitrate (IMDUR ) 30 MG 24 hr tablet Take 30 mg by mouth daily.   Yes [provider]  JARDIANCE 25 MG TABS tablet Take 25 mg by mouth daily. 11/13/22 11/13/23 Yes [provider]  latanoprost  (XALATAN ) 0.005 % ophthalmic solution Place 1 drop into both eyes at bedtime. 11/22/22  Yes [provider]  lubiprostone  (AMITIZA ) 24 MCG capsule Take 24 mcg by mouth 2 (two) times daily with a meal. 07/07/23  Yes [provider]  Magnesium  Oxide 250 MG TABS Take 500 mg by mouth daily.   Yes [provider]  meclizine  (ANTIVERT ) 25 MG tablet Take 25 mg by mouth See admin instructions. Take 25 mg in the morning, and additional 25 mg if needed up to a total of three per day for Meniere's disease 07/06/15  Yes [provider]   metFORMIN  (GLUCOPHAGE ) 1000 MG tablet Take 1,000 mg by mouth 2 (two) times daily. 07/25/20  Yes [provider]  metoprolol  succinate (TOPROL -XL) 100 MG 24 hr tablet Take 100 mg by mouth 2 (two) times daily. Take with or immediately following a meal.   Yes [provider]  montelukast  (SINGULAIR ) 10 MG tablet Take 10 mg by mouth at bedtime.    Yes [provider]  naloxone  (NARCAN ) nasal spray 4 mg/0.1 mL Place 1 spray into the nose as needed for up to 365 doses (for opioid-induced respiratory depresssion). In case of emergency (overdose), spray once into each nostril. If no response within 3 minutes, repeat application and call 911. 11/25/22 11/25/23 Yes Tanya Glisson, MD  niacin  (NIASPAN ) 500 MG CR tablet Take 500 mg by mouth at bedtime.   Yes [provider]  nitroGLYCERIN  (NITROSTAT ) 0.4 MG SL tablet Place 0.4 mg under the tongue as needed. 08/03/20  Yes [provider]  oxyCODONE  (OXY IR/ROXICODONE ) 5 MG immediate release tablet Take 1 tablet (5 mg total) by mouth every 8 (eight) hours. Must last 30 days. 08/17/23 09/16/23 Yes Patel, Seema K, NP  prasugrel  (EFFIENT ) 10 MG TABS tablet Take 10 mg by mouth daily. 11/22/19  Yes [provider]  rosuvastatin  (CRESTOR ) 40 MG tablet Take 40 mg by mouth at bedtime.   Yes [provider]  oxyCODONE  (OXY IR/ROXICODONE ) 5 MG immediate release tablet Take 1 tablet (5 mg total) by mouth every 8 (eight) hours. Must last 30 days. 07/18/23 08/17/23  Patel, Seema K, NP  oxyCODONE  (OXY IR/ROXICODONE ) 5 MG immediate release tablet Take 1 tablet (5 mg total) by mouth  every 8 (eight) hours. Must last 30 days. 09/16/23 10/16/23  Patel, Seema K, NP  senna-docusate (SENOKOT-S) 8.6-50 MG tablet Take 1 tablet by mouth 2 (two) times daily. 04/26/20   Lue Elsie BROCKS, MD  Scheduled Meds:  bisacodyl   10 mg Rectal BID   Chlorhexidine  Gluconate Cloth  6 each Topical Daily   diltiazem   240 mg Oral Daily   feeding  supplement  237 mL Oral TID BM   gabapentin   300 mg Oral BID   insulin  aspart  0-9 Units Subcutaneous Q4H   isosorbide  mononitrate  30 mg Oral Daily   latanoprost   1 drop Both Eyes QHS   lubiprostone   24 mcg Oral BID WC   montelukast   10 mg Oral QHS   multivitamin  1 tablet Oral QHS   rosuvastatin   40 mg Oral QHS   senna  2 tablet Oral Daily   Continuous Infusions:  cefTRIAXone  (ROCEPHIN )  IV 1 g (08/29/23 1059)   prismasol  BGK 4/2.5 400 mL/hr at 08/29/23 2317   prismasol  BGK 4/2.5 400 mL/hr at 08/29/23 2317   prismasol  BGK 4/2.5 2,000 mL/hr at 08/30/23 0212   sodium chloride  irrigation     PRN Meds:.acetaminophen , ALPRAZolam , heparin , hydrALAZINE , nitroGLYCERIN , ondansetron  (ZOFRAN ) IV, oxyCODONE , sodium chloride    Critical care time: 44 minutes       Almarie Nose, DNP, CCRN, FNP-C, AGACNP-BC Acute Care & Family Nurse Practitioner  [facility]  See Amion for personal pager PCCM on call pager [phone] until 7 am

## 2023-08-30 NOTE — Progress Notes (Signed)
 Physical Therapy Evaluation Patient Details Name: Kenneth Osborn MRN: [id number] DOB: 1955-03-25 Today's Date: 08/30/2023  History of Present Illness  68 y.o. male with past medical history of renal cell carcinoma status post left nephrectomy, chronic kidney disease stage IIIb baseline EGFR 38, coronary disease with history of stent placement on Effient , hypertension, lipidemia, diabetes mellitus type 2, anxiety, chronic back pain, Mnire's disease who presented with hematuria and status post cystoscopy, right ureteral stent placement, and retrograde pyelography.  Clinical Impression  Pt pleasant and interactive, needing redirection at times to stay on task but ultimately showed ability to ambulate ~75 ft as well as show some competence with bed mobility and transfers.  He did need extra time, effort and cuing but did not need direct assist to get to sitting nor to stand.  Pt's O2 mid 90s to high 80s t/o the effort on room air, able to speak nearly the entire time with only minimal DOE/SOB.  Pt is not at his baseline and will benefit from continued PT to address functional limitations.         If plan is discharge home, recommend the following: A little help with walking and/or transfers;A little help with bathing/dressing/bathroom;Assistance with cooking/housework;Assist for transportation;Help with stairs or ramp for entrance   Can travel by private vehicle        Equipment Recommendations None recommended by PT  Recommendations for Other Services       Functional Status Assessment Patient has had a recent decline in their functional status and demonstrates the ability to make significant improvements in function in a reasonable and predictable amount of time.     Precautions / Restrictions Precautions Precautions: Fall Restrictions Weight Bearing Restrictions Per Provider Order: No      Mobility  Bed Mobility Overal bed mobility: Needs Assistance Bed Mobility: Sidelying to  Sit, Rolling Rolling: Supervision Sidelying to sit: Min assist       General bed mobility comments: slow and heavily reliant on the rails but able with persistance achieved sitting EOB w/o direct assist    Transfers Overall transfer level: Needs assistance Equipment used: Rolling walker (2 wheels) Transfers: Sit to/from Stand Sit to Stand: Contact guard assist           General transfer comment: Pt stood from both bed and recliner.  Needed cuing each time for appropriate UE use and sequencing, but able to rise w/o direct assist.    Ambulation/Gait Ambulation/Gait assistance: Contact guard assist Gait Distance (Feet): 75 Feet Assistive device: Rolling walker (2 wheels)         General Gait Details: Initial few steps from bed to recliner were unsteady and with heavy forward lean on walker.  After seated rest and some cuign for appropriate posturing/AD use and general encouragement he was able to ambulate into the hallway and return (~47ft) with better cadence and posture.  Still needing consistent cuing for safety, Pt's SpO2 remained in the 88-93% range on room air t/o the effort  Stairs            Wheelchair Mobility     Tilt Bed    Modified Rankin (Stroke Patients Only)       Balance Overall balance assessment: Needs assistance Sitting-balance support: Bilateral upper extremity supported Sitting balance-Leahy Scale: Fair     Standing balance support: Bilateral upper extremity supported Standing balance-Leahy Scale: Fair Standing balance comment: tending to keep walker too far away with increased gait distance, consistent cuing to correct  Pertinent Vitals/Pain Pain Assessment Pain Assessment: Faces Faces Pain Scale: Hurts little more Pain Location: reports chronic back pain as well as significant catheter associated discomfort    Home Living Family/patient expects to be discharged to:: Private residence Living  Arrangements: Spouse/significant other Available Help at Discharge: Family Type of Home: House Home Access: Stairs to enter Entrance Stairs-Rails:  (yes) Entrance Stairs-Number of Steps: 2 Alternate Level Stairs-Number of Steps: flight, has (and uses) chair lift Home Layout: Two level Home Equipment: Agricultural consultant (2 wheels);Rollator (4 wheels);Cane - single point;Grab bars - tub/shower;Grab bars - toilet Additional Comments: stairs chair lift    Prior Function Prior Level of Function : Independent/Modified Independent             Mobility Comments: reports he is still driving and running errands, uses walker most of the time.  Endores regular falls and/or LOBs ADLs Comments: report as independent     Extremity/Trunk Assessment   Upper Extremity Assessment Upper Extremity Assessment: Generalized weakness    Lower Extremity Assessment Lower Extremity Assessment: Generalized weakness       Communication   Communication Communication: No apparent difficulties    Cognition   Behavior During Therapy: WFL for tasks assessed/performed   PT - Cognitive impairments: No apparent impairments                       PT - Cognition Comments: Pt tends to be loquacious Following commands: Intact       Cueing       General Comments General comments (skin integrity, edema, etc.): Pt eager to get out of bed, ultimately did better than he expected after a few days with minimal activity    Exercises     Assessment/Plan    PT Assessment Patient needs continued PT services  PT Problem List Decreased strength;Decreased activity tolerance;Decreased balance;Decreased safety awareness;Decreased mobility;Decreased knowledge of use of DME;Pain       PT Treatment Interventions DME instruction;Gait training;Stair training;Functional mobility training;Therapeutic activities;Balance training;Therapeutic exercise;Neuromuscular re-education;Patient/family education    PT Goals  (Current goals can be found in the Care Plan section)  Acute Rehab PT Goals Patient Stated Goal: Get stronger and go home PT Goal Formulation: With patient Time For Goal Achievement: 09/12/23 Potential to Achieve Goals: Good    Frequency Min 2X/week     Co-evaluation               AM-PAC PT 6 Clicks Mobility  Outcome Measure Help needed turning from your back to your side while in a flat bed without using bedrails?: A Little Help needed moving from lying on your back to sitting on the side of a flat bed without using bedrails?: A Little Help needed moving to and from a bed to a chair (including a wheelchair)?: A Little Help needed standing up from a chair using your arms (e.g., wheelchair or bedside chair)?: A Little Help needed to walk in hospital room?: A Little Help needed climbing 3-5 steps with a railing? : A Lot 6 Click Score: 17    End of Session Equipment Utilized During Treatment: Gait belt Activity Tolerance: Patient tolerated treatment well Patient left: in chair;with call bell/phone within reach;with nursing/sitter in room Nurse Communication: Mobility status PT Visit Diagnosis: Muscle weakness (generalized) (M62.81);Difficulty in walking, not elsewhere classified (R26.2)    Time: 1435-1500 PT Time Calculation (min) (ACUTE ONLY): 25 min   Charges:   PT Evaluation $PT Eval Low Complexity: 1 Low PT Treatments $Gait Training:  8-22 mins PT General Charges $$ ACUTE PT VISIT: 1 Visit         Carmin JONELLE Deed, DPT 08/30/2023, 3:49 PM

## 2023-08-30 NOTE — Plan of Care (Signed)

## 2023-08-30 NOTE — Progress Notes (Addendum)
 Persistent hypertension . BP ranging from 166-170's systolic. Initially reported pain and was treated with oxycodone  at 8pm and hydralazine  for hypertension without improvement. Also continues to be hyperglycemic. With BS running in the high 300's. Currently on ensure TID but could benefit from Glucerna instead and diabetes coordinator consult.  I have discussed concerns of hypertension with NP on unit.

## 2023-08-31 DIAGNOSIS — I2510 Atherosclerotic heart disease of native coronary artery without angina pectoris: Secondary | ICD-10-CM | POA: Diagnosis not present

## 2023-08-31 DIAGNOSIS — E663 Overweight: Secondary | ICD-10-CM | POA: Diagnosis not present

## 2023-08-31 DIAGNOSIS — R310 Gross hematuria: Secondary | ICD-10-CM | POA: Diagnosis not present

## 2023-08-31 DIAGNOSIS — F419 Anxiety disorder, unspecified: Secondary | ICD-10-CM | POA: Diagnosis not present

## 2023-08-31 LAB — GLUCOSE, CAPILLARY
Glucose-Capillary: 162 mg/dL — ABNORMAL HIGH (ref 70–99)
Glucose-Capillary: 199 mg/dL — ABNORMAL HIGH (ref 70–99)
Glucose-Capillary: 362 mg/dL — ABNORMAL HIGH (ref 70–99)
Glucose-Capillary: 314 mg/dL — ABNORMAL HIGH (ref 70–99)
Glucose-Capillary: 320 mg/dL — ABNORMAL HIGH (ref 70–99)
Glucose-Capillary: 358 mg/dL — ABNORMAL HIGH (ref 70–99)

## 2023-08-31 LAB — BASIC METABOLIC PANEL WITH GFR
Anion gap: 10 (ref 5–15)
BUN: 48 mg/dL — ABNORMAL HIGH (ref 8–23)
CO2: 27 mmol/L (ref 22–32)
Calcium: 9.4 mg/dL (ref 8.9–10.3)
Chloride: 106 mmol/L (ref 98–111)
Creatinine, Ser: 3.92 mg/dL — ABNORMAL HIGH (ref 0.61–1.24)
GFR, Estimated: 16 mL/min — ABNORMAL LOW (ref >60)
Glucose, Bld: 159 mg/dL — ABNORMAL HIGH (ref 70–99)
Potassium: 3.8 mmol/L (ref 3.5–5.1)
Sodium: 143 mmol/L (ref 135–145)

## 2023-08-31 LAB — CBC
HCT: 35.4 % — ABNORMAL LOW (ref 39.0–52.0)
Hemoglobin: 11.2 g/dL — ABNORMAL LOW (ref 13.0–17.0)
MCH: 26.2 pg (ref 26.0–34.0)
MCHC: 31.6 g/dL (ref 30.0–36.0)
MCV: 82.9 fL (ref 80.0–100.0)
Platelets: 179 K/uL (ref 150–400)
RBC: 4.27 MIL/uL (ref 4.22–5.81)
RDW: 14.6 % (ref 11.5–15.5)
WBC: 6 K/uL (ref 4.0–10.5)
nRBC: 0 % (ref 0.0–0.2)

## 2023-08-31 LAB — HEPATITIS B SURFACE ANTIBODY, QUANTITATIVE: Hep B S AB Quant (Post): 14.8 m[IU]/mL

## 2023-08-31 MED ORDER — HYDRALAZINE HCL 20 MG/ML IJ SOLN
10.0000 mg | Freq: Four times a day (QID) | INTRAMUSCULAR | Status: DC | PRN
  Administered 2023-08-31: 10 mg via INTRAVENOUS
  Filled 2023-08-31: qty 1

## 2023-08-31 MED ORDER — LABETALOL HCL 5 MG/ML IV SOLN
10.0000 mg | INTRAVENOUS | Status: DC | PRN
  Administered 2023-09-01: 10 mg via INTRAVENOUS
  Filled 2023-08-31: qty 4

## 2023-08-31 NOTE — Plan of Care (Signed)

## 2023-08-31 NOTE — Progress Notes (Signed)
 PROGRESS NOTE    Kenneth Osborn  FMW:[id number] DOB: 02-Sep-1955 DOA: 08/26/2023 PCP: Valora Agent, MD  Chief Complaint  Patient presents with   Hematuria    Hospital Course:  Kenneth Osborn 68 year old male with left renal cell carcinoma status post nephrectomy with solitary right kidney, type 2 diabetes, CAD on aspirin .  He presented to [facility] ED on 7/8 with gross hematuria and difficulty initiating urinary stream.  CT abdomen pelvis revealed hyperdense material in the right renal collecting system suspicious for hemorrhagic material.  Urology was consulted and initiated bladder irrigation.  Patient was also found to have AKI superimposed on CKD stage IIIb.  Nephrology was consulted.  Pelvic ultrasound revealed no significant residual clot burden.  On 7/10 patient developed worsening renal failure with hyperkalemia.  He underwent cystoscopy, right ureteral stent placement, right retrograde pyelogram.  Postoperatively he developed acute encephalopathy and metabolic acidosis requiring BiPAP and transferred to the ICU.  He was initiated on CRRT.  On 7/11 he was able to transition off of BiPAP.  On 7/12 CRRT was discontinued.  On 7/13 TRH assumed care.   Subjective: This morning patient has no acute complaints.  No events overnight.  He has passed speech eval this morning.  His wife is at bedside reports that the patient's mental status seems to have improved significantly.  Patient endorses some discomfort with the Foley catheter but no suprapubic pain at this time.   Objective: Vitals:   08/31/23 0900 08/31/23 1000 08/31/23 1030 08/31/23 1254  BP: (!) 188/85 (!) 156/76 (!) 163/90 (!) 144/86  Pulse: 100 94 100 93  Resp: 17 (!) 24 18 20   Temp:    98.5 F (36.9 C)  TempSrc:    Oral  SpO2: 96% 93% 90% 95%  Weight:      Height:        Intake/Output Summary (Last 24 hours) at 08/31/2023 1455 Last data filed at 08/31/2023 1200 Gross per 24 hour  Intake --  Output 5050 ml  Net -5050 ml    Filed Weights   08/29/23 0417 08/30/23 0420 08/31/23 0142  Weight: 93.7 kg 98.5 kg 96.9 kg    Examination: General exam: Appears calm and comfortable, NAD  Respiratory system: No work of breathing, symmetric chest wall expansion, 2 L Austwell in place Cardiovascular system: S1 & S2 heard, RRR.  Gastrointestinal system: Abdomen is nondistended, soft and nontender.  Neuro: Alert and oriented. No focal neurological deficits. Extremities: Symmetric, expected ROM Skin: No rashes, lesions Psychiatry: Demonstrates appropriate judgement and insight. Mood & affect appropriate for situation. GU: Foley catheter with pink-tinged urine  Assessment & Plan:  Principal Problem:   Hematuria Active Problems:   CAD (coronary artery disease)   HTN (hypertension)   HLD (hyperlipidemia)   Chronic kidney disease, stage 3b (HCC)   Anxiety   Chronic pain syndrome   Overweight (BMI 25.0-29.9)    AKI superimposed on CKD stage IIIb History of left renal cell carcinoma requiring left nephrectomy Solitary right kidney - Status post CRRT during this admission - Nephrology consulted, creatinine improving slowly - Continue to monitor urine output closely - Trend CMP - Avoid nephrotoxic medications - Renally dose with a creatinine clearance of 20 when needed  Acute hypoxic respiratory failure - Was initially on BiPAP, has now weaned to nasal cannula - Continue to wean O2  Hematuria - Status post cystoscopy and right ureteral stent placement - Has been on CBI, now discontinued - Continue Foley catheter - Urine is clear this  morning - Continue to monitor urinary output - Urine culture with no growth x 3 days.  Discontinue ceftriaxone   Acute blood loss anemia secondary to gross hematuria - Hemoglobin remains stable - Transfuse if under 7 - Hold Effient  and DVT prophylaxis.  SCDs for now.  Abdominal distention Mild ileus - KUB with mild ileus and stool burden - Continue current bowel regimen.   Last BM today.  Type 2 diabetes - At home on metformin  and Jardiance.  Holding both in setting of AKI - Hemoglobin A1c 7.4%. - Continue with sliding scale insulin  for now - Trend CBGs, titrate insulin  needs daily  Electrolyte disturbances Hyperkalemia Hyponatremia - Trend CMP - Replace electrolytes as needed  Acute toxic metabolic encephalopathy - Secondary to metabolic acidosis, azotemia, and anesthesia.  Occurred postoperatively while in the ICU - Presently patient is AO x 4.  His wife reports that he is nearing his mental status baseline - Worked with SLP today.  On renal diet now  Metabolic acidosis - Resolved.  Anxiety Chronic back pain - Continue home meds  CAD - Continue holding Effient  for now given gross hematuria  Hypertension -Resume home meds gradually.  Monitor blood pressure.  Titrate as needed.  Some issues with bradycardia during this admission.  Hyperlipidemia - Continue statin  History of esophageal dysfunction - Follows with GI outpatient.  DVT prophylaxis: SCDs   Code Status: Full Code Disposition:    Consultants:  Treatment Team:  Consulting Physician: Twylla Glendia BROCKS, MD  Procedures:  Cystoscopy   Antimicrobials:  Anti-infectives (From admission, onward)    Start     Dose/Rate Route Frequency Ordered Stop   08/28/23 1100  cefTRIAXone  (ROCEPHIN ) 1 g in sodium chloride  0.9 % 100 mL IVPB        1 g 200 mL/hr over 30 Minutes Intravenous Every 24 hours 08/28/23 0900         Data Reviewed: I have personally reviewed following labs and imaging studies CBC: Recent Labs  Lab 08/26/23 1639 08/27/23 0348 08/28/23 0505 08/28/23 1546 08/28/23 1950 08/29/23 0420 08/30/23 0329 08/31/23 0350  WBC 7.7   < > 15.3* 13.4*  --  11.4* 7.4 6.0  NEUTROABS 5.1  --   --  11.5*  --   --   --   --   HGB 14.2   < > 14.5 12.9* 13.4 12.3* 10.6* 11.2*  HCT 45.0   < > 44.2 39.9 40.2 38.2* 33.3* 35.4*  MCV 83.2   < > 80.1 80.8  --  82.2 84.5 82.9  PLT  237   < > 211 165  --  183 136* 179   < > = values in this interval not displayed.   Basic Metabolic Panel: Recent Labs  Lab 08/28/23 2230 08/29/23 0422 08/29/23 1534 08/30/23 0329 08/31/23 0350  NA 137 137 137 138 143  K 5.2* 4.3 4.7 4.3 3.8  CL 99 101 101 104 106  CO2 25 21* 26 28 27   GLUCOSE 267* 164* 272* 305* 159*  BUN 61* 45* 42* 32* 48*  CREATININE 7.84* 5.76* 4.40* 2.89* 3.92*  CALCIUM  8.5* 8.9 8.6* 8.4* 9.4  MG  --  1.8  --  2.4  --   PHOS  --  4.7* 4.1  --   --    GFR: Estimated Creatinine Clearance: 20.7 mL/min (A) (by C-G formula based on SCr of 3.92 mg/dL (H)). Liver Function Tests: Recent Labs  Lab 08/26/23 1639 08/28/23 1546 08/29/23 0422 08/29/23 1534  AST  50* 16  --   --   ALT 55* 30  --   --   ALKPHOS 92 77  --   --   BILITOT 0.6 0.6  --   --   PROT 7.4 6.5  --   --   ALBUMIN  3.7 3.0* 2.9* 2.9*   CBG: Recent Labs  Lab 08/30/23 1934 08/30/23 2344 08/31/23 0335 08/31/23 0739 08/31/23 1238  GLUCAP 330* 281* 162* 199* 362*    Recent Results (from the past 240 hours)  Culture, blood (Routine X 2) w Reflex to ID Panel     Status: None (Preliminary result)   Collection Time: 08/28/23  9:27 AM   Specimen: BLOOD  Result Value Ref Range Status   Specimen Description BLOOD BLOOD RIGHT HAND  Final   Special Requests   Final    BOTTLES DRAWN AEROBIC AND ANAEROBIC Blood Culture adequate volume   Culture   Final    NO GROWTH 3 DAYS Performed at [facility], [address], [place], [place] [address]    Report Status PENDING  Incomplete  Culture, blood (Routine X 2) w Reflex to ID Panel     Status: None (Preliminary result)   Collection Time: 08/28/23  9:32 AM   Specimen: BLOOD  Result Value Ref Range Status   Specimen Description BLOOD BLOOD LEFT HAND  Final   Special Requests   Final    BOTTLES DRAWN AEROBIC AND ANAEROBIC Blood Culture adequate volume   Culture   Final    NO GROWTH 3 DAYS Performed at [facility], [address], [place], [place] [address]    Report Status PENDING  Incomplete  Urine Culture     Status: None   Collection Time: 08/28/23 12:41 PM   Specimen: PATH Cytology Urine  Result Value Ref Range Status   Specimen Description   Final    URINE, RANDOM Performed at [facility], [address] [place], [place] [address]    Special Requests   Final     CYTO URINE Performed at [facility], [address] [place], [place] [address]    Culture   Final    NO GROWTH Performed at [facility], [address] [place], [place] [address]    Report Status 08/30/2023 FINAL  Final     Radiology Studies: DG Chest Port 1 View Result Date: 08/30/2023 CLINICAL DATA:  Pneumonia.  Choking. EXAM: PORTABLE CHEST 1 VIEW COMPARISON:  Chest x-ray 08/28/2023 FINDINGS: Which right-sided central venous catheter tip projects over the distal SVC. The heart size and mediastinal contours are within normal limits. Both lungs are clear. Cervical spinal fusion plate is present. No acute osseous abnormality. IMPRESSION: No active disease. Electronically Signed   By: Greig Pique M.D.   On: 08/30/2023 18:47    Scheduled Meds:  Chlorhexidine  Gluconate Cloth  6 each Topical Daily   diltiazem   240 mg Oral Daily   feeding supplement  237 mL Oral TID BM   gabapentin   300 mg Oral BID   insulin  aspart  0-15 Units Subcutaneous Q4H   isosorbide  mononitrate  30 mg Oral Daily   latanoprost   1 drop Both Eyes QHS   lubiprostone   24 mcg Oral BID WC   montelukast   10 mg Oral QHS   multivitamin  1 tablet Oral QHS   rosuvastatin   40 mg Oral QHS   senna  2 tablet Oral Daily   Continuous Infusions:  cefTRIAXone  (ROCEPHIN )  IV  1 g (08/31/23 1146)   sodium chloride  irrigation       LOS: 4 days  MDM: Patient is high risk for one or more organ failure.  They necessitate ongoing hospitalization for continued IV therapies and subsequent lab monitoring. Total time spent interpreting labs and  vitals, reviewing the medical record, coordinating care amongst consultants and care team members, directly assessing and discussing care with the patient and/or family: 55 min  Kenneth Lovelady, DO [facility]  To contact the attending physician between 7A-7P please use Epic Chat. To contact the covering physician during after hours 7P-7A, please review Amion.  08/31/2023, 2:55 PM   *This document has been created with the assistance of dictation software. Please excuse typographical errors. *

## 2023-08-31 NOTE — Progress Notes (Signed)
 [facility]  ROUNDING NOTE   Subjective:   Patient well-known to us  from office f/u for chronic kidney disease in the setting of left nephrectomy.  He presented this time with gross hematuria.  He was found to have new right-sided hydronephrosis with hyperdense material in the right renal collecting system. He underwent cystoscopy and right retrograde pyelography this admission on 7/10 by Dr Twylla. Right ureteral stent placed. Doing fair. Remains on [place] O2 4 L No complaints of shortness of breath. Continues to have Foley with blood-tinged urine.  Objective:  Vital signs in last 24 hours:  Temp:  [98 F (36.7 C)-99.3 F (37.4 C)] 98.5 F (36.9 C) (07/13 1254) Pulse Rate:  [82-102] 93 (07/13 1254) Resp:  [16-31] 20 (07/13 1254) BP: (131-188)/(52-120) 144/86 (07/13 1254) SpO2:  [90 %-98 %] 95 % (07/13 1254) Weight:  [96.9 kg] 96.9 kg (07/13 0142)  Weight change: -1.6 kg Filed Weights   08/29/23 0417 08/30/23 0420 08/31/23 0142  Weight: 93.7 kg 98.5 kg 96.9 kg    Intake/Output: I/O last 3 completed shifts: In: 480 [P.O.:480] Out: 5723.6 [Urine:5450]   Intake/Output this shift:  Total I/O In: -  Out: 1725 [Urine:1725]  Physical Exam: General: No acute distress  Head: Normocephalic, atraumatic. Moist oral mucosal membranes  Neck: Supple  Lungs:  Clear to auscultation, normal effort, mild crackles at bases  Heart: S1S2 no rubs  Abdomen:  Soft, nontender, bowel sounds present  Extremities: 1+ peripheral edema.  Neurologic: Awake, alert, following commands  Skin: No acute rash  Access: Right IJ temporary hemodialysis catheter  Foley catheter in place  Basic Metabolic Panel: Recent Labs  Lab 08/28/23 2230 08/29/23 0422 08/29/23 1534 08/30/23 0329 08/31/23 0350  NA 137 137 137 138 143  K 5.2* 4.3 4.7 4.3 3.8  CL 99 101 101 104 106  CO2 25 21* 26 28 27   GLUCOSE 267* 164* 272* 305* 159*  BUN 61* 45* 42* 32* 48*  CREATININE 7.84* 5.76* 4.40* 2.89*  3.92*  CALCIUM  8.5* 8.9 8.6* 8.4* 9.4  MG  --  1.8  --  2.4  --   PHOS  --  4.7* 4.1  --   --     Liver Function Tests: Recent Labs  Lab 08/26/23 1639 08/28/23 1546 08/29/23 0422 08/29/23 1534  AST 50* 16  --   --   ALT 55* 30  --   --   ALKPHOS 92 77  --   --   BILITOT 0.6 0.6  --   --   PROT 7.4 6.5  --   --   ALBUMIN  3.7 3.0* 2.9* 2.9*   No results for input(s): LIPASE, AMYLASE in the last 168 hours. No results for input(s): AMMONIA in the last 168 hours.  CBC: Recent Labs  Lab 08/26/23 1639 08/27/23 0348 08/28/23 0505 08/28/23 1546 08/28/23 1950 08/29/23 0420 08/30/23 0329 08/31/23 0350  WBC 7.7   < > 15.3* 13.4*  --  11.4* 7.4 6.0  NEUTROABS 5.1  --   --  11.5*  --   --   --   --   HGB 14.2   < > 14.5 12.9* 13.4 12.3* 10.6* 11.2*  HCT 45.0   < > 44.2 39.9 40.2 38.2* 33.3* 35.4*  MCV 83.2   < > 80.1 80.8  --  82.2 84.5 82.9  PLT 237   < > 211 165  --  183 136* 179   < > = values in this interval not displayed.  Cardiac Enzymes: No results for input(s): CKTOTAL, CKMB, CKMBINDEX, TROPONINI in the last 168 hours.  BNP: Invalid input(s): POCBNP  CBG: Recent Labs  Lab 08/30/23 1934 08/30/23 2344 08/31/23 0335 08/31/23 0739 08/31/23 1238  GLUCAP 330* 281* 162* 199* 362*    Microbiology: Results for orders placed or performed during the hospital encounter of 08/26/23  Culture, blood (Routine X 2) w Reflex to ID Panel     Status: None (Preliminary result)   Collection Time: 08/28/23  9:27 AM   Specimen: BLOOD  Result Value Ref Range Status   Specimen Description BLOOD BLOOD RIGHT HAND  Final   Special Requests   Final    BOTTLES DRAWN AEROBIC AND ANAEROBIC Blood Culture adequate volume   Culture   Final    NO GROWTH 3 DAYS Performed at [facility], [address] [place], [place] [address]    Report Status PENDING  Incomplete  Culture, blood (Routine X 2) w Reflex to ID Panel     Status: None (Preliminary result)    Collection Time: 08/28/23  9:32 AM   Specimen: BLOOD  Result Value Ref Range Status   Specimen Description BLOOD BLOOD LEFT HAND  Final   Special Requests   Final    BOTTLES DRAWN AEROBIC AND ANAEROBIC Blood Culture adequate volume   Culture   Final    NO GROWTH 3 DAYS Performed at [facility], [address], [place], [place] [address]    Report Status PENDING  Incomplete  Urine Culture     Status: None   Collection Time: 08/28/23 12:41 PM   Specimen: PATH Cytology Urine  Result Value Ref Range Status   Specimen Description   Final    URINE, RANDOM Performed at [facility], [address], [place], [place] [address]    Special Requests   Final     CYTO URINE Performed at [facility], [address] [place], [place] [address]    Culture   Final    NO GROWTH Performed at [facility], [address] [place], [place] [address]    Report Status 08/30/2023 FINAL  Final    Coagulation Studies: No results for input(s): LABPROT, INR in the last 72 hours.   Urinalysis: No results for input(s): COLORURINE, LABSPEC, PHURINE, GLUCOSEU, HGBUR, BILIRUBINUR, KETONESUR, PROTEINUR, UROBILINOGEN, NITRITE, LEUKOCYTESUR in the last 72 hours.  Invalid input(s): APPERANCEUR     Imaging: DG Chest Port 1 View Result Date: 08/30/2023 CLINICAL DATA:  Pneumonia.  Choking. EXAM: PORTABLE CHEST 1 VIEW COMPARISON:  Chest x-ray 08/28/2023 FINDINGS: Which right-sided central venous catheter tip projects over the distal SVC. The heart size and mediastinal contours are within normal limits. Both lungs are clear. Cervical spinal fusion plate is present. No acute osseous abnormality. IMPRESSION: No active disease. Electronically Signed   By: Greig Pique M.D.   On: 08/30/2023 18:47     Medications:    cefTRIAXone  (ROCEPHIN )  IV 1 g (08/31/23 1146)   sodium chloride  irrigation      Chlorhexidine  Gluconate Cloth  6 each Topical  Daily   diltiazem   240 mg Oral Daily   feeding supplement  237 mL Oral TID BM   gabapentin   300 mg Oral BID   insulin  aspart  0-15 Units Subcutaneous Q4H   isosorbide  mononitrate  30 mg Oral Daily   latanoprost   1 drop Both Eyes QHS   lubiprostone   24 mcg Oral BID WC   montelukast   10 mg Oral  QHS   multivitamin  1 tablet Oral QHS   rosuvastatin   40 mg Oral QHS   senna  2 tablet Oral Daily   acetaminophen , albuterol , ALPRAZolam , hydrALAZINE , labetalol , nitroGLYCERIN , ondansetron  (ZOFRAN ) IV, oxyCODONE   Assessment/ Plan:  68 y.o. male with past medical history of renal cell carcinoma status post left nephrectomy, chronic kidney disease stage IIIb baseline EGFR 38, coronary disease with history of stent placement on Effient , hypertension, lipidemia, diabetes mellitus type 2, anxiety, chronic back pain, Mnire's disease who presented with hematuria and status post cystoscopy, right ureteral stent placement, and retrograde pyelography.  1.  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  The patient's acute kidney injury appears to be secondary to new right-sided hydronephrosis which was due to right renal bleeding.  Patient status post cystoscopy and right retrograde pyelography on 08/28/2023.  Given severe hyperkalemia patient was started on CRRT.  Renal parameters and urine output have improved.  CRRT discontinued on 7/12.  Urine output reported at about 4200 cc yesterday   No indication for dialysis at present.  We will continue to monitor  2.  Hyperkalemia.   Potassium level has corrected and is now in the normal range.  3.  Type 2 diabetes with CKD Hemoglobin A1c 7.4% from 11/01/2021 Outpatient regimen included Jardiance, zetia  Agree with d/c metformin .     LOS: 4 Harry Shuck 7/13/20251:04 PM

## 2023-08-31 NOTE — Evaluation (Signed)
 Clinical/Bedside Swallow Evaluation Patient Details  Name: Kenneth Osborn MRN: [id number] Date of Birth: 03-Apr-1955  Today's Date: 08/31/2023 Time: SLP Start Time (ACUTE ONLY): 0850 SLP Stop Time (ACUTE ONLY): 0910 SLP Time Calculation (min) (ACUTE ONLY): 20 min  Past Medical History:  Past Medical History:  Diagnosis Date   Acute postoperative pain 12/03/2018   Allergic rhinitis 12/30/2012   Anemia    Anginal pain (HCC)    Anxiety    Bronchitis    hx of   Can't get food down 08/12/2014   Cancer (HCC) 01/2020   kidney    Chronic back pain    thoracic area   Concussion 09/2015   Coronary artery disease    99% blockage   DDD (degenerative disc disease), cervical    Dehydration symptoms    2019   Diabetes mellitus without complication (HCC)    insulin  dependent   Dysphagia    GERD (gastroesophageal reflux disease)    History of Meniere's disease 12/21/2014   History of thoracic spine surgery (S/P T9-10 IVD spacer) 12/21/2014   Hypercalcemia    Hypercholesteremia    Hyperkalemia    Hyperlipidemia    Hypertension    sees Dr. Norleen walker [facility]   Meniere's disease    deaf in right ear, takes diazepam    Myocardial infarction [facility]) 2012   Sees Dr. Jettie, [facility]   Neuromuscular disorder [facility])    diabetic neuropathy in feet   Proteinuria    Renal cell carcinoma (HCC)    Renal disorder    Secondary hyperparathyroidism of renal origin (HCC)    Severe sepsis (HCC) 02/07/2020   Short-segment Barrett's esophagus    Sleep apnea    mild   Past Surgical History:  Past Surgical History:  Procedure Laterality Date   APPENDECTOMY     age 89   ARTHRODESIS ANTERIOR ANTERIOR CERVICLE SPINE  01/04/2013   BACK SURGERY     fusion thoracic area   CARDIAC CATHETERIZATION     may 2012 and Nov 20, 2010   CARDIAC CATHETERIZATION N/A 06/15/2015   Procedure: Left Heart Cath and Coronary Angiography;  Surgeon: Wolm JINNY Rhyme, MD;  Location: [facility] INVASIVE CV LAB;   Service: Cardiovascular;  Laterality: N/A;   CARDIAC CATHETERIZATION N/A 06/15/2015   Procedure: Coronary Stent Intervention;  Surgeon: Marsa Dooms, MD;  Location: [facility] INVASIVE CV LAB;  Service: Cardiovascular;  Laterality: N/A;   CARDIOVASCULAR STRESS TEST     jan 2014   COLONOSCOPY WITH PROPOFOL  N/A 09/19/2014   Procedure: COLONOSCOPY WITH PROPOFOL ;  Surgeon: Lamar ONEIDA Holmes, MD;  Location: [facility] ENDOSCOPY;  Service: Endoscopy;  Laterality: N/A;   COLONOSCOPY WITH PROPOFOL  N/A 04/03/2021   Procedure: COLONOSCOPY WITH PROPOFOL ;  Surgeon: Maryruth Ole ONEIDA, MD;  Location: [facility] ENDOSCOPY;  Service: Endoscopy;  Laterality: N/A;  IDDM   COLONOSCOPY WITH PROPOFOL  N/A 06/25/2023   Procedure: COLONOSCOPY WITH PROPOFOL ;  Surgeon: Toledo, Ladell POUR, MD;  Location: [facility] ENDOSCOPY;  Service: Gastroenterology;  Laterality: N/A;  IDDM, 1ST CASE   CORONARY ANGIOPLASTY     stent placement x3   CYSTOSCOPY W/ URETERAL STENT PLACEMENT Right 08/28/2023   Procedure: CYSTOSCOPY, WITH RETROGRADE PYELOGRAM AND URETERAL STENT INSERTION;  Surgeon: Twylla Glendia BROCKS, MD;  Location: [facility] ORS;  Service: Urology;  Laterality: Right;  RIGHT STENT PLACEMENT, right RETROGRADE PYELOGRAM   ESOPHAGOGASTRODUODENOSCOPY N/A 09/19/2014   Procedure: ESOPHAGOGASTRODUODENOSCOPY (EGD);  Surgeon: Lamar ONEIDA Holmes, MD;  Location: [facility] ENDOSCOPY;  Service: Endoscopy;  Laterality: N/A;   IR PERC  CHOLECYSTOSTOMY  02/09/2020   IR RADIOLOGIST EVAL & MGMT  03/14/2020   LABRINTHECTOMY     1999 right ear   LUMBAR SPINAL CORD SIMULATOR LEAD REMOVAL Right 08/09/2019   Procedure: REMOVAL SPINAL CORD STIMULATOR PERCUTANEOUS LEADS, REMOVAL PULSE GENERATOR;  Surgeon: Bluford Standing, MD;  Location: [facility] ORS;  Service: Neurosurgery;  Laterality: Right;  LOCAL WITH MAC   mastoid shunt Bilateral    left, 2002, 1997 right ear, 1980 right ear   NEPHRECTOMY Left    PULSE GENERATOR IMPLANT N/A 01/18/2019   Procedure: MEDTRONIC SPINAL CORD STIMULATOR  BATTERY EXCHANGE;  Surgeon: Bluford Standing, MD;  Location: [facility] ORS;  Service: Neurosurgery;  Laterality: N/A;   ROBOT ASSISTED LAPAROSCOPIC NEPHRECTOMY Left 04/20/2020   Procedure: XI ROBOTIC ASSITED LAPAROSCOPIC NEPHRECTOMY;  Surgeon: Renda Glance, MD;  Location: [facility] ORS;  Service: Urology;  Laterality: Left;   SAVORY DILATION N/A 09/19/2014   Procedure: SAVORY DILATION;  Surgeon: Lamar ONEIDA Holmes, MD;  Location: [facility] ENDOSCOPY;  Service: Endoscopy;  Laterality: N/A;   SHOULDER ARTHROSCOPY WITH SUBACROMIAL DECOMPRESSION Left 04/06/2012   Procedure: SHOULDER ARTHROSCOPY WITH SUBACROMIAL DECOMPRESSION;  Surgeon: Marcey Raman, MD;  Location: [facility] OR;  Service: Orthopedics;  Laterality: Left;  left shoulder arthroscopy, subacromial decompression and distal clavicle resection   SPINAL CORD STIMULATOR IMPLANT Right    HPI:  This is a 68 yo male with a PMH of left renal cell carcinoma s/p nephrectomy with solitary right kidney, type II diabetes mellitus, and CAD on effient .  He presented to [facility] ER on 07/08 with gross hematuria with blood clots onset the morning of 07/08.  Pt reported he also had difficulty initiating a urine stream along with worsening pain.    Assessment / Plan / Recommendation  Clinical Impression  Pt reports history of esophageal dysfunction with recent DG Esophagus on 05/02/2023 that revealed flash vestibular penetration. Small anterior cervical web. Moderate dysmotility with tertiary contractions. Spontaneous reflux to the level of the lower esophagus. He reports that no recommendations were made by GI except to take small bites/sips and use slow rate of consumption. Chart review also reveals history of Dysphagia (08/12/2014) GERD (gastroesophageal reflux disease), Barrett's esophagus/hx of H. pylori. No active respiratory disease or unintentional weight loss reported by pt. He further describes yesterday the chicken had sat there and was too dry.  During this evaluation, pt presents  with adequate oropharyngeal abilities when consuming thin liquids via straw, puree as well as graham crackers. He was free of any s/s of dysphagia, aspiration or globus sensation. At this time, recommend regular diet with pt empowered to request an alternate tray should he feel the food is too dry or too tough. Pt voiced understanding. From an oropharyngeal perspective, his risk of aspiration appears reduced when following general aspiration precautions. Recommend pt follow up with GI for further evaluation should he continue to experience difficulty with globus sensation.   SLP Visit Diagnosis: Dysphagia, unspecified (R13.10)    Aspiration Risk  Mild aspiration risk (d/t esophageal dysfunction)    Diet Recommendation Regular;Thin liquid    Liquid Administration via: Cup;Straw Medication Administration: Whole meds with liquid Supervision: Patient able to self feed Compensations: Minimize environmental distractions;Slow rate;Small sips/bites Postural Changes: Seated upright at 90 degrees;Remain upright for at least 30 minutes after po intake    Other  Recommendations Oral Care Recommendations: Oral care BID     Assistance Recommended at Discharge  N/A  Functional Status Assessment Patient has not had a recent decline in their functional status  Frequency and Duration   N/A         Prognosis   N/A     Swallow Study   General Date of Onset: 08/30/23 HPI: This is a 68 yo male with a PMH of left renal cell carcinoma s/p nephrectomy with solitary right kidney, type II diabetes mellitus, and CAD on effient .  He presented to [facility] ER on 07/08 with gross hematuria with blood clots onset the morning of 07/08.  Pt reported he also had difficulty initiating a urine stream along with worsening pain. Type of Study: Bedside Swallow Evaluation Previous Swallow Assessment: non ein chart Diet Prior to this Study: NPO (d/t choking on a chicken sandwich the previous evening) Temperature Spikes Noted:  No Respiratory Status: Nasal cannula History of Recent Intubation: No Behavior/Cognition: Alert;Cooperative;Pleasant mood Oral Cavity Assessment: Within Functional Limits Oral Care Completed by SLP: No Oral Cavity - Dentition: Adequate natural dentition Vision: Functional for self-feeding Self-Feeding Abilities: Able to feed self Patient Positioning: Upright in bed Baseline Vocal Quality: Normal Volitional Cough: Strong Volitional Swallow: Able to elicit    Oral/Motor/Sensory Function Overall Oral Motor/Sensory Function: Within functional limits   Ice Chips Ice chips: Within functional limits Presentation: Self Fed;Spoon   Thin Liquid Thin Liquid: Within functional limits Presentation: Self Fed;Straw    Nectar Thick Nectar Thick Liquid: Not tested   Honey Thick Honey Thick Liquid: Not tested   Puree Puree: Within functional limits Presentation: Self Fed;Spoon   Solid     Solid: Within functional limits Presentation: Self Fed     Tyniesha Howald B. Rubbie, M.S., CCC-SLP, Tree surgeon Certified Brain Injury Specialist [facility]  [facility] Office [phone] Ascom [phone] Fax [phone]

## 2023-08-31 NOTE — Plan of Care (Signed)
   Problem: Education: Goal: Ability to describe self-care measures that may prevent or decrease complications (Diabetes Survival Skills Education) will improve Outcome: Progressing

## 2023-09-01 ENCOUNTER — Encounter: Payer: Self-pay | Admitting: Internal Medicine

## 2023-09-01 DIAGNOSIS — G894 Chronic pain syndrome: Secondary | ICD-10-CM | POA: Diagnosis not present

## 2023-09-01 DIAGNOSIS — E663 Overweight: Secondary | ICD-10-CM | POA: Diagnosis not present

## 2023-09-01 DIAGNOSIS — I10 Essential (primary) hypertension: Secondary | ICD-10-CM | POA: Diagnosis not present

## 2023-09-01 DIAGNOSIS — R319 Hematuria, unspecified: Secondary | ICD-10-CM | POA: Diagnosis not present

## 2023-09-01 LAB — COMPREHENSIVE METABOLIC PANEL WITH GFR
ALT: 23 U/L (ref 0–44)
AST: 18 U/L (ref 15–41)
Albumin: 2.9 g/dL — ABNORMAL LOW (ref 3.5–5.0)
Alkaline Phosphatase: 82 U/L (ref 38–126)
Anion gap: 9 (ref 5–15)
BUN: 57 mg/dL — ABNORMAL HIGH (ref 8–23)
CO2: 26 mmol/L (ref 22–32)
Calcium: 9.5 mg/dL (ref 8.9–10.3)
Chloride: 104 mmol/L (ref 98–111)
Creatinine, Ser: 3.53 mg/dL — ABNORMAL HIGH (ref 0.61–1.24)
GFR, Estimated: 18 mL/min — ABNORMAL LOW (ref >60)
Glucose, Bld: 298 mg/dL — ABNORMAL HIGH (ref 70–99)
Potassium: 3.9 mmol/L (ref 3.5–5.1)
Sodium: 139 mmol/L (ref 135–145)
Total Bilirubin: 0.4 mg/dL (ref 0.0–1.2)
Total Protein: 6.5 g/dL (ref 6.5–8.1)

## 2023-09-01 LAB — CBC WITH DIFFERENTIAL/PLATELET
Abs Immature Granulocytes: 0.04 K/uL (ref 0.00–0.07)
Basophils Absolute: 0.1 K/uL (ref 0.0–0.1)
Basophils Relative: 1 %
Eosinophils Absolute: 0.2 K/uL (ref 0.0–0.5)
Eosinophils Relative: 3 %
HCT: 35.4 % — ABNORMAL LOW (ref 39.0–52.0)
Hemoglobin: 11.3 g/dL — ABNORMAL LOW (ref 13.0–17.0)
Immature Granulocytes: 1 %
Lymphocytes Relative: 26 %
Lymphs Abs: 1.6 K/uL (ref 0.7–4.0)
MCH: 26.2 pg (ref 26.0–34.0)
MCHC: 31.9 g/dL (ref 30.0–36.0)
MCV: 81.9 fL (ref 80.0–100.0)
Monocytes Absolute: 0.7 K/uL (ref 0.1–1.0)
Monocytes Relative: 11 %
Neutro Abs: 3.8 K/uL (ref 1.7–7.7)
Neutrophils Relative %: 58 %
Platelets: 193 K/uL (ref 150–400)
RBC: 4.32 MIL/uL (ref 4.22–5.81)
RDW: 14.4 % (ref 11.5–15.5)
WBC: 6.3 K/uL (ref 4.0–10.5)
nRBC: 0 % (ref 0.0–0.2)

## 2023-09-01 LAB — RENAL FUNCTION PANEL
Albumin: 2.8 g/dL — ABNORMAL LOW (ref 3.5–5.0)
Anion gap: 9 (ref 5–15)
BUN: 57 mg/dL — ABNORMAL HIGH (ref 8–23)
CO2: 27 mmol/L (ref 22–32)
Calcium: 9.5 mg/dL (ref 8.9–10.3)
Chloride: 103 mmol/L (ref 98–111)
Creatinine, Ser: 3.39 mg/dL — ABNORMAL HIGH (ref 0.61–1.24)
GFR, Estimated: 19 mL/min — ABNORMAL LOW (ref >60)
Glucose, Bld: 299 mg/dL — ABNORMAL HIGH (ref 70–99)
Phosphorus: 3.8 mg/dL (ref 2.5–4.6)
Potassium: 3.9 mmol/L (ref 3.5–5.1)
Sodium: 139 mmol/L (ref 135–145)

## 2023-09-01 LAB — CYTOLOGY - NON PAP

## 2023-09-01 LAB — GLUCOSE, CAPILLARY
Glucose-Capillary: 297 mg/dL — ABNORMAL HIGH (ref 70–99)
Glucose-Capillary: 269 mg/dL — ABNORMAL HIGH (ref 70–99)
Glucose-Capillary: 429 mg/dL — ABNORMAL HIGH (ref 70–99)
Glucose-Capillary: 465 mg/dL — ABNORMAL HIGH (ref 70–99)
Glucose-Capillary: 379 mg/dL — ABNORMAL HIGH (ref 70–99)
Glucose-Capillary: 315 mg/dL — ABNORMAL HIGH (ref 70–99)
Glucose-Capillary: 237 mg/dL — ABNORMAL HIGH (ref 70–99)

## 2023-09-01 LAB — MAGNESIUM: Magnesium: 2.2 mg/dL (ref 1.7–2.4)

## 2023-09-01 LAB — PHOSPHORUS: Phosphorus: 3.9 mg/dL (ref 2.5–4.6)

## 2023-09-01 MED ORDER — INSULIN GLARGINE-YFGN 100 UNIT/ML ~~LOC~~ SOLN
20.0000 [IU] | Freq: Every day | SUBCUTANEOUS | Status: DC
  Administered 2023-09-01: 20 [IU] via SUBCUTANEOUS
  Filled 2023-09-01 (×2): qty 0.2

## 2023-09-01 MED ORDER — INSULIN ASPART 100 UNIT/ML IJ SOLN
4.0000 [IU] | Freq: Three times a day (TID) | INTRAMUSCULAR | Status: DC
  Administered 2023-09-01: 4 [IU] via SUBCUTANEOUS
  Filled 2023-09-01: qty 1

## 2023-09-01 MED ORDER — HYDRALAZINE HCL 10 MG PO TABS
10.0000 mg | ORAL_TABLET | Freq: Three times a day (TID) | ORAL | Status: DC
  Administered 2023-09-01 – 2023-09-03 (×7): 10 mg via ORAL
  Filled 2023-09-01 (×7): qty 1

## 2023-09-01 NOTE — Progress Notes (Signed)
 The patient has been transferred to room 215 in 2C. Report has been given to Autumn, Charity fundraiser.

## 2023-09-01 NOTE — Progress Notes (Signed)
 Physical Therapy Treatment Patient Details Name: Kenneth Osborn MRN: [id number] DOB: Feb 21, 1955 Today's Date: 09/01/2023   History of Present Illness 68 y.o. male with past medical history of renal cell carcinoma status post left nephrectomy, chronic kidney disease stage IIIb baseline EGFR 38, coronary disease with history of stent placement on Effient , hypertension, lipidemia, diabetes mellitus type 2, anxiety, chronic back pain, Mnire's disease who presented with hematuria and status post cystoscopy, right ureteral stent placement, and retrograde pyelography.    PT Comments  Stood and walked 220' with RW and cga x 1.  Overall walks well with no LOB or buckling noted.  Pt very talkative throughout session.  Returned to bed after session for nursing interventions.  BP 171/86 P 99 O2 96% on 2lpm after gait.  BP likely taken during gait and abnormally high/poor reading so it was repeated when supine and WFL.     If plan is discharge home, recommend the following: A little help with walking and/or transfers;A little help with bathing/dressing/bathroom;Assistance with cooking/housework;Assist for transportation;Help with stairs or ramp for entrance   Can travel by private vehicle        Equipment Recommendations  None recommended by PT    Recommendations for Other Services       Precautions / Restrictions       Mobility  Bed Mobility Overal bed mobility: Needs Assistance Bed Mobility: Sit to Supine       Sit to supine: Contact guard assist     Patient Response: Cooperative  Transfers Overall transfer level: Needs assistance Equipment used: Rolling walker (2 wheels) Transfers: Sit to/from Stand Sit to Stand: Contact guard assist                Ambulation/Gait Ambulation/Gait assistance: Contact guard assist Gait Distance (Feet): 220 Feet Assistive device: Rolling walker (2 wheels) Gait Pattern/deviations: Step-through pattern Gait velocity: WFL     General Gait  Details: generally steady without LOB   Stairs             Wheelchair Mobility     Tilt Bed Tilt Bed Patient Response: Cooperative  Modified Rankin (Stroke Patients Only)       Balance Overall balance assessment: Needs assistance Sitting-balance support: Bilateral upper extremity supported Sitting balance-Leahy Scale: Good     Standing balance support: Bilateral upper extremity supported Standing balance-Leahy Scale: Fair                              Hotel manager: No apparent difficulties  Cognition Arousal: Alert Behavior During Therapy: WFL for tasks assessed/performed   PT - Cognitive impairments: No apparent impairments                       PT - Cognition Comments: very talkative Following commands: Intact      Cueing Cueing Techniques: Verbal cues  Exercises      General Comments        Pertinent Vitals/Pain Pain Assessment Pain Assessment: No/denies pain    Home Living                          Prior Function            PT Goals (current goals can now be found in the care plan section) Progress towards PT goals: Progressing toward goals    Frequency    Min 2X/week  PT Plan      Co-evaluation              AM-PAC PT 6 Clicks Mobility   Outcome Measure  Help needed turning from your back to your side while in a flat bed without using bedrails?: A Little Help needed moving from lying on your back to sitting on the side of a flat bed without using bedrails?: A Little Help needed moving to and from a bed to a chair (including a wheelchair)?: A Little Help needed standing up from a chair using your arms (e.g., wheelchair or bedside chair)?: A Little Help needed to walk in hospital room?: A Little Help needed climbing 3-5 steps with a railing? : A Little 6 Click Score: 18    End of Session Equipment Utilized During Treatment: Gait belt Activity Tolerance:  Patient tolerated treatment well Patient left: in bed;with call bell/phone within reach;with bed alarm set Nurse Communication: Mobility status PT Visit Diagnosis: Muscle weakness (generalized) (M62.81);Difficulty in walking, not elsewhere classified (R26.2)     Time: 1348-1410 PT Time Calculation (min) (ACUTE ONLY): 22 min  Charges:    $Gait Training: 8-22 mins PT General Charges $$ ACUTE PT VISIT: 1 Visit                    Lauraine Gills, PTA 09/01/23, 3:02 PM

## 2023-09-01 NOTE — Plan of Care (Signed)

## 2023-09-01 NOTE — Plan of Care (Signed)
 The patient has has been up in the chair for most of the day today. Aox4. Per nephrology order trialysis catheter is to be removed. MD and urology have been notified that the patient has had some bleeding from around his foley catheter. Hospitalist has been notified that the patient's blood glucose has 465 at lunch time. Current orders are to provide sliding scale insulin  and to administer long acting insulin  ordered.  Problem: Education: Goal: Ability to describe self-care measures that may prevent or decrease complications (Diabetes Survival Skills Education) will improve Outcome: Progressing Goal: Individualized Educational Video(s) Outcome: Progressing   Problem: Coping: Goal: Ability to adjust to condition or change in health will improve Outcome: Progressing   Problem: Fluid Volume: Goal: Ability to maintain a balanced intake and output will improve Outcome: Progressing   Problem: Health Behavior/Discharge Planning: Goal: Ability to identify and utilize available resources and services will improve Outcome: Progressing Goal: Ability to manage health-related needs will improve Outcome: Progressing   Problem: Metabolic: Goal: Ability to maintain appropriate glucose levels will improve Outcome: Progressing   Problem: Nutritional: Goal: Maintenance of adequate nutrition will improve Outcome: Progressing Goal: Progress toward achieving an optimal weight will improve Outcome: Progressing   Problem: Skin Integrity: Goal: Risk for impaired skin integrity will decrease Outcome: Progressing   Problem: Tissue Perfusion: Goal: Adequacy of tissue perfusion will improve Outcome: Progressing   Problem: Education: Goal: Knowledge of General Education information will improve Description: Including pain rating scale, medication(s)/side effects and non-pharmacologic comfort measures Outcome: Progressing   Problem: Health Behavior/Discharge Planning: Goal: Ability to manage  health-related needs will improve Outcome: Progressing   Problem: Clinical Measurements: Goal: Ability to maintain clinical measurements within normal limits will improve Outcome: Progressing Goal: Will remain free from infection Outcome: Progressing Goal: Diagnostic test results will improve Outcome: Progressing Goal: Respiratory complications will improve Outcome: Progressing Goal: Cardiovascular complication will be avoided Outcome: Progressing   Problem: Activity: Goal: Risk for activity intolerance will decrease Outcome: Progressing   Problem: Nutrition: Goal: Adequate nutrition will be maintained Outcome: Progressing   Problem: Coping: Goal: Level of anxiety will decrease Outcome: Progressing   Problem: Elimination: Goal: Will not experience complications related to bowel motility Outcome: Progressing Goal: Will not experience complications related to urinary retention Outcome: Progressing   Problem: Pain Managment: Goal: General experience of comfort will improve and/or be controlled Outcome: Progressing   Problem: Safety: Goal: Ability to remain free from injury will improve Outcome: Progressing   Problem: Skin Integrity: Goal: Risk for impaired skin integrity will decrease Outcome: Progressing

## 2023-09-01 NOTE — TOC Initial Note (Signed)
 Transition of Care [facility]) - Initial/Assessment Note    Patient Details  Name: Kenneth Osborn MRN: [id number] Date of Birth: November 12, 1955  [facility]) CM/SW Contact:    Lauraine JAYSON Carpen, LCSW Phone Number: 09/01/2023, 1:38 PM  Clinical Narrative:    Readmission prevention screen complete. CSW met with patient. No family at bedside. CSW introduced role and explained that discharge planning would be discussed. PCP is Lynwood Null, MD. Patient drives himself to appointments or his wife drives him. [facility] [address]. He reports some issues affording medications. He does get income-based pricing on his diabetes medication. Patient lives home with wife. No home health prior to admission but he is agreeable as long as they will work with him. Gave CMS scores for agencies that serve his zip code. He will review to determine preference. He has canes, walkers, a BSC and shower chair that he doesn't use, and a chair lift for his stairs. Patient is currently on acute oxygen. Will follow for this potential discharge need. No further concerns. CSW will continue to follow patient for support and facilitate return home once stable. His wife will transport him home at discharge.              Expected Discharge Plan: Home w Home Health Services Barriers to Discharge: Continued Medical Work up   Patient Goals and CMS Choice   CMS Medicare.gov Compare Post Acute Care list provided to:: Patient        Expected Discharge Plan and Services     Post Acute Care Choice: Home Health Living arrangements for the past 2 months: Single Family Home                                      Prior Living Arrangements/Services Living arrangements for the past 2 months: Single Family Home Lives with:: Spouse Patient language and need for interpreter reviewed:: Yes Do you feel safe going back to the place where you live?: Yes      Need for Family Participation in  Patient Care: Yes (Comment) Care giver support system in place?: Yes (comment) Current home services: DME Criminal Activity/Legal Involvement Pertinent to Current Situation/Hospitalization: No - Comment as needed  Activities of Daily Living      Permission Sought/Granted Permission sought to share information with : Facility Industrial/product designer granted to share information with : Yes, Verbal Permission Granted     Permission granted to share info w AGENCY: Home health agencies        Emotional Assessment Appearance:: Appears stated age Attitude/Demeanor/Rapport: Engaged, Gracious Affect (typically observed): Accepting, Appropriate, Calm, Pleasant Orientation: : Oriented to Self, Oriented to Place, Oriented to  Time, Oriented to Situation Alcohol / Substance Use: Not Applicable Psych Involvement: No (comment)  Admission diagnosis:  Gross hematuria [R31.0] Renal hemorrhage, right [N28.89] Hematuria [R31.9] Patient Active Problem List   Diagnosis Date Noted   Hematuria 08/26/2023   HTN (hypertension) 08/26/2023   HLD (hyperlipidemia) 08/26/2023   Anxiety 08/26/2023   Overweight (BMI 25.0-29.9) 08/26/2023   Abnormal MRI, lumbar spine (12/24/2013) 10/15/2022   History of coronary artery stent placement 10/02/2022   Lumbar facet joint pain 09/02/2022   Proteinuria 06/04/2022   Hypertensive kidney disease, malignant, stage 1-4 or unspecified chronic kidney disease 06/04/2022   Osteoarthritis of knee (Left) 05/22/2022   Osteoarthritis of patellofemoral joint (Left) 05/22/2022   Baker's  cyst (tiny) (Left) 05/22/2022   Acute pain of left knee 05/06/2022   Chronic knee pain (Left) 05/06/2022   Failure to attend appointment 04/29/2022   Long term prescription benzodiazepine use 01/02/2022    Class: Chronic   Epidural fibrosis 12/04/2021   Chronic low back pain (Right) w/ sciatica (Right) 11/07/2021   Chronic lower extremity pain (Right) 11/07/2021   Chronic Lumbar  radicular pain (Right: S1) 11/07/2021   Hypercalcemia 04/12/2021   Secondary hyperparathyroidism of renal origin (HCC) 02/07/2021   Anemia in chronic kidney disease 12/25/2020   Malignant neoplasm of left kidney, except renal pelvis (HCC) 12/25/2020   Type 2 diabetes mellitus with diabetic chronic kidney disease (HCC) 12/25/2020   Chronic use of opiate for therapeutic purpose 05/23/2020   Uncomplicated opioid dependence (HCC) 05/23/2020   Constipation 04/24/2020   Low grade fever 04/24/2020   Hypoxia    Status post nephrectomy    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 04/20/2020   Acute on chronic cholecystitis s/p robotic cholecystectomy 04/20/2020 04/20/2020   Elevated LFTs    Renal mass    Hypokalemia    Chronic low back pain (Bilateral) w/o sciatica 12/16/2019   Hyperglycemia 12/16/2019   Pharmacologic therapy 08/03/2019   Disorder of skeletal system 08/03/2019   Problems influencing health status 08/03/2019   Postop check 12/17/2018   DDD (degenerative disc disease), lumbosacral 09/21/2018   Diabetic polyneuropathy associated with diabetes mellitus due to underlying condition (HCC) 09/10/2018   Neuropathic pain 09/10/2018   Musculoskeletal pain 09/10/2018   Acute renal failure superimposed on stage 3a chronic kidney disease (HCC) 02/19/2018   Frequent falls 02/07/2018   Hyperkalemia 01/18/2018   Spondylosis without myelopathy or radiculopathy, lumbosacral region 11/13/2017   Cardiac syncope 09/08/2017   History of coronary artery disease 09/03/2017   Spinal cord stimulator dysfunction (HCC) 12/26/2016   Trigger point posterior superior iliac spine (PSIS) (Right) 12/25/2016   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) 12/25/2016   Polyneuropathy 12/18/2016   Numbness and tingling 11/12/2016   Tachycardia with heart rate 100-120 beats per minute 11/12/2016   Meniere disease, bilateral 11/12/2016   Headache disorder 11/12/2016   Type  2 diabetes mellitus with diabetic neuropathy (HCC) 11/12/2016   Type 2 diabetes mellitus with diabetic polyneuropathy, with long-term current use of insulin  (HCC) 11/12/2016   Tremor 09/11/2016   Chronic pain syndrome 05/28/2016   Bilateral carotid artery stenosis 01/24/2016   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) 11/27/2015   Balance problem 10/09/2015   Chronic tension-type headache, not intractable 10/09/2015   Dizziness 10/09/2015   Post-concussion headache 10/09/2015   PSVT (paroxysmal supraventricular tachycardia) (HCC) 08/31/2015   Diabetes mellitus, type II (HCC) 06/14/2015   Unstable angina (HCC) 06/08/2015   Encounter for interrogation of neurostimulator 03/22/2015   [employer] spinal cord stimulator (implant date: 11/01/2014) 03/22/2015   Type 2 diabetes mellitus with hyperglycemia (HCC) 03/15/2015   Uncontrolled type 2 diabetes mellitus with hyperglycemia, with long-term current use of insulin  (HCC) 03/15/2015   DM type 2 with diabetic peripheral neuropathy (HCC) 03/15/2015   Acid reflux 12/21/2014   OSA (obstructive sleep apnea) 12/21/2014   S/P insertion of spinal cord stimulator 12/21/2014   Chronic low back pain (Bilateral) (L>R) w/ sciatica (Left) 12/21/2014   Failed cervical surgery syndrome (C5-6 ACDF by Dr. Lamar Notch at [facility] on 01/04/2013) 12/21/2014   Long term current use of opiate analgesic 12/21/2014   Long term prescription opiate use 12/21/2014   Opiate use (60 MME/Day)  12/21/2014   Opiate dependence (HCC) 12/21/2014   Encounter for therapeutic drug level monitoring 12/21/2014   Neurogenic pain 12/21/2014   Thoracic facet syndrome (T8-10) 12/21/2014   Lumbar facet syndrome (Bilateral) (R>L) 12/21/2014   Cervical facet syndrome (Right) 12/21/2014   Cervical spondylosis 12/21/2014   Lumbar spondylosis 12/21/2014   Chronic upper extremity pain (Left) 12/21/2014   Chronic cervical radicular pain (Left) 12/21/2014   Chronic upper back  pain 12/21/2014   History of thoracic spine surgery (S/P T9-10 IVD spacer) 12/21/2014   Failed back surgical syndrome 12/21/2014   Chronic musculoskeletal pain 12/21/2014   Chronic lower extremity pain (Left) 12/21/2014   Chronic Lumbar radicular pain (Left: L4) 12/21/2014   Coronary artery disease 12/21/2014   History of MI (myocardial infarction) (May 2012) 12/21/2014   Chronic anticoagulation (Effient ) 12/21/2014   History of Meniere's disease 12/21/2014   Sleep apnea 12/21/2014   History of spinal surgery 11/14/2014   Chronic kidney disease, stage 3b (HCC) 09/26/2014   Hypotension 09/22/2014   Benign essential HTN 09/14/2014   Hypertension 09/14/2014   H/O adenomatous polyp of colon 08/12/2014   Dysphagia 08/12/2014   GERD (gastroesophageal reflux disease) 01/25/2014   Arteriosclerosis of coronary artery 08/13/2013   Hyperlipidemia 08/13/2013   CAD (coronary artery disease) 08/13/2013   Hyperlipidemia associated with type 2 diabetes mellitus (HCC) 08/13/2013   Auditory vertigo 12/30/2012   Cervical spinal stenosis 12/11/2012   Arthralgia of shoulder 03/16/2012   PCP:  Valora Agent, MD Pharmacy:   [facility] [address] ([address]), [place] - [address] [place] ([place]) [place] [address] Phone: [phone] Fax: [phone]  [facility] - [place], [place] - [address] [facility] [address] [place] [address] [place] [address] Phone: [phone] Fax: [phone]     Social Drivers of Health (SDOH) Social History: SDOH Screenings   Food Insecurity: No Food Insecurity (08/27/2023)  Housing: Low Risk  (08/27/2023)  Transportation [place]: No Transportation [place] (08/27/2023)  Utilities: Not At Risk (08/27/2023)  Depression (PHQ2-9): Low Risk  (06/23/2023)  Financial Resource Strain: Low Risk  (08/21/2022)   Received from [facility]  Tobacco Use: Low Risk  (08/26/2023)   Received from [facility]   SDOH Interventions:     Readmission Risk Interventions    09/01/2023    1:35 PM  Readmission Risk Prevention Plan  Transportation Screening Complete  Medication Review (RN Care Manager) Complete  PCP or Specialist appointment within 3-5 days of discharge Complete  SW Recovery Care/Counseling Consult Complete  Palliative Care Screening Not Applicable  Skilled Nursing Facility Not Applicable

## 2023-09-01 NOTE — Inpatient Diabetes Management (Signed)
 Inpatient Diabetes Program Recommendations  AACE/ADA: New Consensus Statement on Inpatient Glycemic Control   Target Ranges:  Prepandial:   less than 140 mg/dL      Peak postprandial:   less than 180 mg/dL (1-2 hours)      Critically ill patients:  140 - 180 mg/dL    Latest Reference Range & Units 08/31/23 07:39 08/31/23 12:38 08/31/23 16:21 08/31/23 19:47 08/31/23 23:17 09/01/23 03:45 09/01/23 07:40  Glucose-Capillary 70 - 99 mg/dL 800 (H) 637 (H) 685 (H) 320 (H) 358 (H) 297 (H) 269 (H)   Review of Glycemic Control  Diabetes history: DM2 Outpatient Diabetes medications: Humulin  R U500 65-75 units TID, Jardiance 25 mg daily, Metformin  1000 mg BID Current orders for Inpatient glycemic control: Novolog  0-15 units Q4H  Inpatient Diabetes Program Recommendations:    Insulin : Please consider ordering Semglee  20 units Q24H and Novolog  4 units TID with meals for meal coverage if patient eats at least 50% of meals.  Thanks, Earnie Gainer, RN, MSN, CDCES Diabetes Coordinator Inpatient Diabetes Program [phone] (Team Pager from 8am to 5pm)

## 2023-09-01 NOTE — Progress Notes (Signed)
 [facility]  ROUNDING NOTE   Subjective:   Patient well-known to us  from office f/u for chronic kidney disease in the setting of left nephrectomy.  He presented this time with gross hematuria.  He was found to have new right-sided hydronephrosis with hyperdense material in the right renal collecting system. He underwent cystoscopy and right retrograde pyelography this admission on 7/10 by Dr Twylla. Right ureteral stent placed. Doing fair. Remains on [place] O2 1-2 L No complaints of shortness of breath. Continues to have Foley    Objective:  Vital signs in last 24 hours:  Temp:  [98 F (36.7 C)-98.9 F (37.2 C)] 98.1 F (36.7 C) (07/14 1200) Pulse Rate:  [82-110] 92 (07/14 1430) Resp:  [12-29] 12 (07/14 1430) BP: (126-176)/(55-133) 172/133 (07/14 1400) SpO2:  [90 %-99 %] 96 % (07/14 1430) Weight:  [95.2 kg] 95.2 kg (07/14 0401)  Weight change: -1.7 kg Filed Weights   08/30/23 0420 08/31/23 0142 09/01/23 0401  Weight: 98.5 kg 96.9 kg 95.2 kg    Intake/Output: I/O last 3 completed shifts: In: -  Out: 6850 [Urine:6850]   Intake/Output this shift:  Total I/O In: 360 [P.O.:360] Out: 850 [Urine:850]  Physical Exam: General: No acute distress  Head: Normocephalic, atraumatic. Moist oral mucosal membranes  Neck: Supple  Lungs:  Clear to auscultation, normal effort, mild crackles at bases  Heart: S1S2 no rubs  Abdomen:  Soft, nontender, bowel sounds present  Extremities: 1+ peripheral edema.  Neurologic: Awake, alert, following commands  Skin: No acute rash  Access: Right IJ temporary hemodialysis catheter  Foley catheter in place  Basic Metabolic Panel: Recent Labs  Lab 08/29/23 0422 08/29/23 1534 08/30/23 0329 08/31/23 0350 09/01/23 0412  NA 137 137 138 143 139  139  K 4.3 4.7 4.3 3.8 3.9  3.9  CL 101 101 104 106 103  104  CO2 21* 26 28 27 27  26   GLUCOSE 164* 272* 305* 159* 299*  298*  BUN 45* 42* 32* 48* 57*  57*  CREATININE 5.76* 4.40*  2.89* 3.92* 3.39*  3.53*  CALCIUM  8.9 8.6* 8.4* 9.4 9.5  9.5  MG 1.8  --  2.4  --  2.2  PHOS 4.7* 4.1  --   --  3.8  3.9    Liver Function Tests: Recent Labs  Lab 08/26/23 1639 08/28/23 1546 08/29/23 0422 08/29/23 1534 09/01/23 0412  AST 50* 16  --   --  18  ALT 55* 30  --   --  23  ALKPHOS 92 77  --   --  82  BILITOT 0.6 0.6  --   --  0.4  PROT 7.4 6.5  --   --  6.5  ALBUMIN  3.7 3.0* 2.9* 2.9* 2.8*  2.9*   No results for input(s): LIPASE, AMYLASE in the last 168 hours. No results for input(s): AMMONIA in the last 168 hours.  CBC: Recent Labs  Lab 08/26/23 1639 08/27/23 0348 08/28/23 1546 08/28/23 1950 08/29/23 0420 08/30/23 0329 08/31/23 0350 09/01/23 0412  WBC 7.7   < > 13.4*  --  11.4* 7.4 6.0 6.3  NEUTROABS 5.1  --  11.5*  --   --   --   --  3.8  HGB 14.2   < > 12.9* 13.4 12.3* 10.6* 11.2* 11.3*  HCT 45.0   < > 39.9 40.2 38.2* 33.3* 35.4* 35.4*  MCV 83.2   < > 80.8  --  82.2 84.5 82.9 81.9  PLT 237   < >  165  --  183 136* 179 193   < > = values in this interval not displayed.    Cardiac Enzymes: No results for input(s): CKTOTAL, CKMB, CKMBINDEX, TROPONINI in the last 168 hours.  BNP: Invalid input(s): POCBNP  CBG: Recent Labs  Lab 08/31/23 2317 09/01/23 0345 09/01/23 0740 09/01/23 1128 09/01/23 1131  GLUCAP 358* 297* 269* 429* 465*    Microbiology: Results for orders placed or performed during the hospital encounter of 08/26/23  Culture, blood (Routine X 2) w Reflex to ID Panel     Status: None (Preliminary result)   Collection Time: 08/28/23  9:27 AM   Specimen: BLOOD  Result Value Ref Range Status   Specimen Description   Final    BLOOD BLOOD RIGHT HAND Performed at [facility], [address] [place], [place] [address]    Special Requests   Final    BOTTLES DRAWN AEROBIC AND ANAEROBIC Blood Culture adequate volume Performed at [facility], [address] [place], [place] [address]    Culture    Final    NO GROWTH 4 DAYS Performed at [facility], [address] [place], [place] [address]    Report Status PENDING  Incomplete  Culture, blood (Routine X 2) w Reflex to ID Panel     Status: None (Preliminary result)   Collection Time: 08/28/23  9:32 AM   Specimen: BLOOD  Result Value Ref Range Status   Specimen Description   Final    BLOOD BLOOD LEFT HAND Performed at [facility], [address] [place], [place] [address]    Special Requests   Final    BOTTLES DRAWN AEROBIC AND ANAEROBIC Blood Culture adequate volume Performed at [facility], [address] [place], [place] [address]    Culture   Final    NO GROWTH 4 DAYS Performed at [facility], [address], [place], [place] [address]    Report Status PENDING  Incomplete  Urine Culture     Status: None   Collection Time: 08/28/23 12:41 PM   Specimen: PATH Cytology Urine  Result Value Ref Range Status   Specimen Description   Final    URINE, RANDOM Performed at [facility], [address] [place], [place] [address]    Special Requests   Final     CYTO URINE Performed at [facility], [address], [place], [place] [address]    Culture   Final    NO GROWTH Performed at [facility], [address] [place], [place] [address]    Report Status 08/30/2023 FINAL  Final    Coagulation Studies: No results for input(s): LABPROT, INR in the last 72 hours.   Urinalysis: No results for input(s): COLORURINE, LABSPEC, PHURINE, GLUCOSEU, HGBUR, BILIRUBINUR, KETONESUR, PROTEINUR, UROBILINOGEN, NITRITE, LEUKOCYTESUR in the last 72 hours.  Invalid input(s): APPERANCEUR     Imaging: DG Chest Port 1 View Result Date: 08/30/2023 CLINICAL DATA:  Pneumonia.  Choking. EXAM: PORTABLE CHEST 1 VIEW COMPARISON:  Chest x-ray 08/28/2023 FINDINGS: Which right-sided central venous catheter tip projects over the distal SVC. The heart size  and mediastinal contours are within normal limits. Both lungs are clear. Cervical spinal fusion plate is present. No acute osseous abnormality. IMPRESSION: No active disease. Electronically Signed   By: Greig Pique M.D.   On: 08/30/2023 18:47     Medications:    sodium chloride  irrigation      Chlorhexidine  Gluconate Cloth  6 each Topical Daily   diltiazem   240 mg Oral Daily   feeding supplement  237 mL Oral TID BM   gabapentin   300 mg Oral BID   insulin  aspart  0-15 Units Subcutaneous Q4H   insulin  glargine-yfgn  20 Units Subcutaneous Daily   isosorbide  mononitrate  30 mg Oral Daily   latanoprost   1 drop Both Eyes QHS   lubiprostone   24 mcg Oral BID WC   montelukast   10 mg Oral QHS   multivitamin  1 tablet Oral QHS   rosuvastatin   40 mg Oral QHS   senna  2 tablet Oral Daily   acetaminophen , albuterol , ALPRAZolam , hydrALAZINE , labetalol , nitroGLYCERIN , ondansetron  (ZOFRAN ) IV, oxyCODONE   Assessment/ Plan:  68 y.o. male with past medical history of renal cell carcinoma status post left nephrectomy, chronic kidney disease stage IIIb baseline EGFR 38, coronary disease with history of stent placement on Effient , hypertension, lipidemia, diabetes mellitus type 2, anxiety, chronic back pain, Mnire's disease who presented with hematuria and status post cystoscopy, right ureteral stent placement, and retrograde pyelography.  1.  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  The patient's acute kidney injury appears to be secondary to new right-sided hydronephrosis which was due to right renal bleeding.  Patient status post cystoscopy and right retrograde pyelography on 08/28/2023.  Given severe hyperkalemia patient was started on CRRT.  Renal parameters and urine output have improved.  CRRT discontinued on 7/12.  Urine output reported at about 4500 cc yesterday   No  indication for dialysis at present.  We will continue to monitor May discontinue right IJ dialysis catheter.  2.  Hyperkalemia.   Potassium level has corrected and is now in the normal range.  3.  Type 2 diabetes with CKD Hemoglobin A1c 7.4% from 11/01/2021 Outpatient regimen included Jardiance, zetia  Agree with d/c metformin .     LOS: 5 Jonh Mcqueary 7/14/20253:01 PM

## 2023-09-01 NOTE — Progress Notes (Signed)
 Urology Consult Follow Up  Subjective: POD # 4  Patient is awake and alert and responding to questions.  He is having some stent discomfort with some right flank irritation and urgency.  VSS afebrile   Serum creatinine trending downward from 3.92 yesterday to 3.53 today.  CRRT discontinued on July 12.  Foley catheter is in place and he is draining clear yellow urine.   Cytology from right renal pelvis is still pending.  Urine culture was negative.   Anti-infectives: Anti-infectives (From admission, onward)    Start     Dose/Rate Route Frequency Ordered Stop   08/28/23 1100  cefTRIAXone  (ROCEPHIN ) 1 g in sodium chloride  0.9 % 100 mL IVPB  Status:  Discontinued        1 g 200 mL/hr over 30 Minutes Intravenous Every 24 hours 08/28/23 0900 08/31/23 1506       Current Facility-Administered Medications  Medication Dose Route Frequency Provider Last Rate Last Admin   acetaminophen  (TYLENOL ) tablet 325 mg  325 mg Oral Q6H PRN Williams, Jamiese M, MD   325 mg at 08/31/23 2203   albuterol  (PROVENTIL ) (2.5 MG/3ML) 0.083% nebulizer solution 2.5 mg  2.5 mg Nebulization Q4H PRN Kasa, Kurian, MD       ALPRAZolam  (XANAX ) tablet 0.5 mg  0.5 mg Oral QHS PRN Niu, Xilin, MD   0.5 mg at 08/29/23 2117   Chlorhexidine  Gluconate Cloth 2 % PADS 6 each  6 each Topical Daily Trudy Anthony HERO, MD   6 each at 09/01/23 0849   diltiazem  (CARDIZEM  CD) 24 hr capsule 240 mg  240 mg Oral Daily Nelwyn Bristle, NP   240 mg at 09/01/23 0847   feeding supplement (ENSURE PLUS HIGH PROTEIN) liquid 237 mL  237 mL Oral TID BM Kasa, Kurian, MD   237 mL at 09/01/23 0848   gabapentin  (NEURONTIN ) capsule 300 mg  300 mg Oral BID Kasa, Kurian, MD   300 mg at 09/01/23 0846   hydrALAZINE  (APRESOLINE ) injection 10 mg  10 mg Intravenous Q6H PRN Dezii, Alexandra, DO   10 mg at 08/31/23 1957   insulin  aspart (novoLOG ) injection 0-15 Units  0-15 Units Subcutaneous Q4H Kathrene Almarie Bake, NP   8 Units at 09/01/23 0846   insulin   glargine-yfgn (SEMGLEE ) injection 20 Units  20 Units Subcutaneous Daily Dezii, Alexandra, DO   20 Units at 09/01/23 1012   isosorbide  mononitrate (IMDUR ) 24 hr tablet 30 mg  30 mg Oral Daily Nelwyn Bristle, NP   30 mg at 09/01/23 0846   labetalol  (NORMODYNE ) injection 10 mg  10 mg Intravenous Q2H PRN Dezii, Alexandra, DO   10 mg at 09/01/23 0407   latanoprost  (XALATAN ) 0.005 % ophthalmic solution 1 drop  1 drop Both Eyes QHS Niu, Xilin, MD   1 drop at 08/31/23 2206   lubiprostone  (AMITIZA ) capsule 24 mcg  24 mcg Oral BID WC Kasa, Kurian, MD   24 mcg at 09/01/23 0847   montelukast  (SINGULAIR ) tablet 10 mg  10 mg Oral QHS Nelwyn Bristle, NP   10 mg at 08/31/23 2203   multivitamin (RENA-VIT) tablet 1 tablet  1 tablet Oral QHS Kasa, Kurian, MD   1 tablet at 08/31/23 2203   nitroGLYCERIN  (NITROSTAT ) SL tablet 0.4 mg  0.4 mg Sublingual PRN Niu, Xilin, MD       ondansetron  (ZOFRAN ) injection 4 mg  4 mg Intravenous Q8H PRN Niu, Xilin, MD   4 mg at 08/27/23 0844   oxyCODONE  (Oxy IR/ROXICODONE ) immediate release tablet 10 mg  10 mg Oral Q6H PRN Trudy Anthony HERO, MD   10 mg at 09/01/23 9053   rosuvastatin  (CRESTOR ) tablet 40 mg  40 mg Oral QHS Niu, Xilin, MD   40 mg at 08/31/23 2203   senna (SENOKOT) tablet 17.2 mg  2 tablet Oral Daily Nelwyn Bristle, NP   17.2 mg at 09/01/23 0846   sodium chloride  irrigation 0.9 % 3,000 mL  3,000 mL Irrigation Continuous Niu, Xilin, MD   3,000 mL at 08/27/23 1722     Objective: Vital signs in last 24 hours: Temp:  [98 F (36.7 C)-98.9 F (37.2 C)] 98.2 F (36.8 C) (07/14 0723) Pulse Rate:  [82-110] 82 (07/14 0700) Resp:  [13-29] 20 (07/14 0700) BP: (126-176)/(55-93) 147/78 (07/14 0700) SpO2:  [92 %-99 %] 94 % (07/14 0700) Weight:  [95.2 kg] 95.2 kg (07/14 0401)  Intake/Output from previous day: 07/13 0701 - 07/14 0700 In: -  Out: 4550 [Urine:4550] Intake/Output this shift: Total I/O In: 240 [P.O.:240] Out: 400 [Urine:400]   Physical Exam Vitals  and nursing note reviewed.  Constitutional:      Appearance: Normal appearance. He is obese.  HENT:     Head: Normocephalic.     Nose: Nose normal.     Mouth/Throat:     Mouth: Mucous membranes are moist.  Eyes:     Extraocular Movements: Extraocular movements intact.     Conjunctiva/sclera: Conjunctivae normal.     Pupils: Pupils are equal, round, and reactive to light.  Pulmonary:     Effort: Pulmonary effort is normal.  Abdominal:     Palpations: Abdomen is soft.  Genitourinary:    Penis: Normal.      Comments: Foley catheter in place draining clear yellow urine.   Musculoskeletal:     Cervical back: Normal range of motion.  Skin:    General: Skin is warm.  Neurological:     General: No focal deficit present.     Mental Status: He is alert and oriented to person, place, and time.  Psychiatric:        Mood and Affect: Mood normal.        Behavior: Behavior normal.        Thought Content: Thought content normal.        Judgment: Judgment normal.     Lab Results:  Recent Labs    08/31/23 0350 09/01/23 0412  WBC 6.0 6.3  HGB 11.2* 11.3*  HCT 35.4* 35.4*  PLT 179 193   BMET Recent Labs    08/31/23 0350 09/01/23 0412  NA 143 139  139  K 3.8 3.9  3.9  CL 106 103  104  CO2 27 27  26   GLUCOSE 159* 299*  298*  BUN 48* 57*  57*  CREATININE 3.92* 3.39*  3.53*  CALCIUM  9.4 9.5  9.5   PT/INR No results for input(s): LABPROT, INR in the last 72 hours. ABG No results for input(s): PHART, HCO3 in the last 72 hours.  Invalid input(s): PCO2, PO2  Studies/Results: DG Chest Port 1 View Result Date: 08/30/2023 CLINICAL DATA:  Pneumonia.  Choking. EXAM: PORTABLE CHEST 1 VIEW COMPARISON:  Chest x-ray 08/28/2023 FINDINGS: Which right-sided central venous catheter tip projects over the distal SVC. The heart size and mediastinal contours are within normal limits. Both lungs are clear. Cervical spinal fusion plate is present. No acute osseous abnormality.  IMPRESSION: No active disease. Electronically Signed   By: Greig Pique M.D.   On: 08/30/2023 18:47  Assessment: 68 year old man with past medical history of left RCC status post nephrectomy with solitary right kidney management Dr. Renda, diabetes on Jardiance and CAD management Effient  was admitted with gross hematuria and clot retention requiring CBI.  Underwent right ureteral stent placement on August 28, 2023 and findings noted for BPH, scant clot debris in the bladder, normal normal caliber ureter, moderate hydronephrosis with filling defects in the renal pelvis calyces likely secondary to clot.  Saline barbotage of right renal pelvic was performed symptomatology and a right ureteral stent placed.  - Cytology pending  - Urine culture negative  - Blood cultures negative  - Foley catheter draining clear yellow urine with good urinary output  Plan: - Discussed that his symptoms of urinary urgency and discomfort in the right flank is typical of having a ureteral stent in place - Discussed worrisome symptoms after having a stent placed, such as fever, intractable pain or continuous bright red blood or dark blood with clots and to seek medical care immediately once discharged if he experiences the symptoms - When stable for discharge, could consider tamsulosin  0.4 mg daily and oxybutynin  IR 5 mg 3 times daily as needed for stent discomfort -Will need follow-up as an outpatient for stent management with Dr. Renda  - Trend CMP - Monitor urine output closely - continue Foley catheter, patient denies any significant history of BPH, so may consider voiding trial when he is closer to discharge pending renal function and any return of hematuria    LOS: 5 days    [facility] 09/01/2023

## 2023-09-01 NOTE — Progress Notes (Signed)
 PROGRESS NOTE    Kenneth Osborn  FMW:[id number] DOB: 03/09/1955 DOA: 08/26/2023 PCP: Valora Agent, MD  Chief Complaint  Patient presents with   Hematuria    Hospital Course:  Kenneth Osborn 68 year old male with left renal cell carcinoma status post nephrectomy with solitary right kidney, type 2 diabetes, CAD on aspirin .  He presented to [facility] ED on 7/8 with gross hematuria and difficulty initiating urinary stream.  CT abdomen pelvis revealed hyperdense material in the right renal collecting system suspicious for hemorrhagic material.  Urology was consulted and initiated bladder irrigation.  Patient was also found to have AKI superimposed on CKD stage IIIb.  Nephrology was consulted.  Pelvic ultrasound revealed no significant residual clot burden.  On 7/10 patient developed worsening renal failure with hyperkalemia.  He underwent cystoscopy, right ureteral stent placement, right retrograde pyelogram.  Postoperatively he developed acute encephalopathy and metabolic acidosis requiring BiPAP and transferred to the ICU.  He was initiated on CRRT.  On 7/11 he was able to transition off of BiPAP.  On 7/12 CRRT was discontinued.  On 7/13 [facility] assumed care.   Subjective: No issues overnight.  Patient continues to wean oxygen today.  He has no acute complaints.  He is anxious to discharge home from the hospital and hopes he will be home by Friday to attend his son's birthday party.  Objective: Vitals:   09/01/23 1430 09/01/23 1500 09/01/23 1525 09/01/23 1628  BP:  (!) 157/87 (!) 156/76 (!) 154/71  Pulse: 92 88 86 90  Resp: 12 17 20 17   Temp:   97.9 F (36.6 C) 98.3 F (36.8 C)  TempSrc:   Oral   SpO2: 96% 94% 95% 98%  Weight:      Height:        Intake/Output Summary (Last 24 hours) at 09/01/2023 1633 Last data filed at 09/01/2023 1300 Gross per 24 hour  Intake 360 ml  Output 2950 ml  Net -2590 ml   Filed Weights   08/30/23 0420 08/31/23 0142 09/01/23 0401  Weight: 98.5 kg 96.9 kg 95.2  kg    Examination: General exam: Appears calm and comfortable, NAD  Respiratory system: No work of breathing, symmetric chest wall expansion, 2 L [place] in place Cardiovascular system: S1 & S2 heard, RRR.  Gastrointestinal system: Abdomen is nondistended, soft and nontender.  Neuro: Alert and oriented. No focal neurological deficits. Extremities: Symmetric, expected ROM Skin: No rashes, lesions Psychiatry: Demonstrates appropriate judgement and insight. Mood & affect appropriate for situation. GU: Foley catheter with pink-tinged urine  Assessment & Plan:  Principal Problem:   Hematuria Active Problems:   CAD (coronary artery disease)   HTN (hypertension)   HLD (hyperlipidemia)   Chronic kidney disease, stage 3b (HCC)   Anxiety   Chronic pain syndrome   Overweight (BMI 25.0-29.9)    AKI superimposed on CKD stage IIIb History of left renal cell carcinoma requiring left nephrectomy Solitary right kidney - Status post CRRT during this admission - Nephrology consulted, creatinine improving slowly - Continue to monitor urine output and creatinine. - Trend CMP - Can likely discontinue right IJ given no acute needs for dialysis. - Renally dose with a creatinine clearance 23 - Avoid nephrotoxic meds when able  Acute hypoxic respiratory failure - Was initially on BiPAP, has now weaned to nasal cannula - Continue to wean O2, can likely wean to room air within next 24 hours  Hematuria BPH - 7/10 cystoscopy and right ureteral stent placement - Moderate hydronephrosis with filling  defects in the renal pelvis likely secondary to clot. - Cytology still pending - CBI has been discontinued.  Foley output is pink-tinged - Urology still following. - Continue with Foley for now, consider trial of void within next 48 hours. - Urine culture negative, discontinue ceftriaxone   Acute blood loss anemia secondary to gross hematuria - Hemoglobin remains stable - Transfuse if under 7 - Hold  Effient  and DVT prophylaxis.  SCDs for now.  Abdominal distention Mild ileus - KUB with mild ileus and stool burden - Now having bowel movements.  Continue current bowel regimen  Type 2 diabetes - At home on metformin  and Jardiance.  Holding both in setting of AKI - Hemoglobin A1c 7.4%. - Continue with basal/bolus and sliding scale insulin  for now. - Trend CBGs, titrate insulin  daily.  Electrolyte disturbances Hyperkalemia Hyponatremia - Trend CMP - Replace electrolytes as needed  Acute toxic metabolic encephalopathy - Secondary to metabolic acidosis, azotemia, and anesthesia.  Occurred postoperatively while in the ICU - Presently patient is AO x 4.  His wife reports that he is nearing his mental status baseline - Has been cleared for diet by SLP - Continue working with PT/OT.  Will likely require home health at discharge  Metabolic acidosis - Resolved.  Anxiety Chronic back pain - Continue home meds  CAD - Continue holding Effient  for now given gross hematuria  Hypertension - Blood pressure trending upward now.  Some issues with bradycardia during this admission.  This is now resolved - Avoid nephrotoxic medications.  Will add hydralazine  today.  Hyperlipidemia - Continue statin  History of esophageal dysfunction - Follows with GI outpatient.  DVT prophylaxis: SCDs   Code Status: Full Code Disposition:    Consultants:  Treatment Team:  Consulting Physician: Twylla Glendia BROCKS, MD  Procedures:  Cystoscopy   Antimicrobials:  Anti-infectives (From admission, onward)    Start     Dose/Rate Route Frequency Ordered Stop   08/28/23 1100  cefTRIAXone  (ROCEPHIN ) 1 g in sodium chloride  0.9 % 100 mL IVPB  Status:  Discontinued        1 g 200 mL/hr over 30 Minutes Intravenous Every 24 hours 08/28/23 0900 08/31/23 1506       Data Reviewed: I have personally reviewed following labs and imaging studies CBC: Recent Labs  Lab 08/26/23 1639 08/27/23 0348  08/28/23 1546 08/28/23 1950 08/29/23 0420 08/30/23 0329 08/31/23 0350 09/01/23 0412  WBC 7.7   < > 13.4*  --  11.4* 7.4 6.0 6.3  NEUTROABS 5.1  --  11.5*  --   --   --   --  3.8  HGB 14.2   < > 12.9* 13.4 12.3* 10.6* 11.2* 11.3*  HCT 45.0   < > 39.9 40.2 38.2* 33.3* 35.4* 35.4*  MCV 83.2   < > 80.8  --  82.2 84.5 82.9 81.9  PLT 237   < > 165  --  183 136* 179 193   < > = values in this interval not displayed.   Basic Metabolic Panel: Recent Labs  Lab 08/29/23 0422 08/29/23 1534 08/30/23 0329 08/31/23 0350 09/01/23 0412  NA 137 137 138 143 139  139  K 4.3 4.7 4.3 3.8 3.9  3.9  CL 101 101 104 106 103  104  CO2 21* 26 28 27 27  26   GLUCOSE 164* 272* 305* 159* 299*  298*  BUN 45* 42* 32* 48* 57*  57*  CREATININE 5.76* 4.40* 2.89* 3.92* 3.39*  3.53*  CALCIUM  8.9 8.6*  8.4* 9.4 9.5  9.5  MG 1.8  --  2.4  --  2.2  PHOS 4.7* 4.1  --   --  3.8  3.9   GFR: Estimated Creatinine Clearance: 23.7 mL/min (A) (by C-G formula based on SCr of 3.39 mg/dL (H)). Liver Function Tests: Recent Labs  Lab 08/26/23 1639 08/28/23 1546 08/29/23 0422 08/29/23 1534 09/01/23 0412  AST 50* 16  --   --  18  ALT 55* 30  --   --  23  ALKPHOS 92 77  --   --  82  BILITOT 0.6 0.6  --   --  0.4  PROT 7.4 6.5  --   --  6.5  ALBUMIN  3.7 3.0* 2.9* 2.9* 2.8*  2.9*   CBG: Recent Labs  Lab 09/01/23 0345 09/01/23 0740 09/01/23 1128 09/01/23 1131 09/01/23 1533  GLUCAP 297* 269* 429* 465* 379*    Recent Results (from the past 240 hours)  Culture, blood (Routine X 2) w Reflex to ID Panel     Status: None (Preliminary result)   Collection Time: 08/28/23  9:27 AM   Specimen: BLOOD  Result Value Ref Range Status   Specimen Description   Final    BLOOD BLOOD RIGHT HAND Performed at [facility], [address], [place], [place] [address]    Special Requests   Final    BOTTLES DRAWN AEROBIC AND ANAEROBIC Blood Culture adequate volume Performed at [facility], [address], [place], [place] [address]    Culture   Final    NO GROWTH 4 DAYS Performed at [facility], [address] [place], [place] [address]    Report Status PENDING  Incomplete  Culture, blood (Routine X 2) w Reflex to ID Panel     Status: None (Preliminary result)   Collection Time: 08/28/23  9:32 AM   Specimen: BLOOD  Result Value Ref Range Status   Specimen Description   Final    BLOOD BLOOD LEFT HAND Performed at [facility], [address] [place], [place] [address]    Special Requests   Final    BOTTLES DRAWN AEROBIC AND ANAEROBIC Blood Culture adequate volume Performed at [facility], [address], [place], [place] [address]    Culture   Final    NO GROWTH 4 DAYS Performed at [facility], [address] [place], [place] [address]    Report Status PENDING  Incomplete  Urine Culture     Status: None   Collection Time: 08/28/23 12:41 PM   Specimen: PATH Cytology Urine  Result Value Ref Range Status   Specimen Description   Final    URINE, RANDOM Performed at [facility], [address] [place], [place] [address]    Special Requests   Final     CYTO URINE Performed at [facility], [address], [place], [place] [address]    Culture   Final    NO GROWTH Performed at [facility], [address], [place], [place] [address]    Report Status 08/30/2023 FINAL  Final     Radiology Studies: DG Chest Port 1 View Result Date: 08/30/2023 CLINICAL DATA:  Pneumonia.  Choking. EXAM: PORTABLE CHEST 1 VIEW COMPARISON:  Chest x-ray 08/28/2023 FINDINGS: Which right-sided central venous catheter tip projects over the distal SVC. The heart size and mediastinal contours are within normal limits. Both lungs are clear. Cervical spinal fusion plate is present. No acute  osseous abnormality. IMPRESSION: No active disease. Electronically Signed   By: Greig Pique M.D.   On: 08/30/2023 18:47    Scheduled Meds:   Chlorhexidine  Gluconate Cloth  6 each Topical Daily   diltiazem   240 mg Oral Daily   feeding supplement  237 mL Oral TID BM   gabapentin   300 mg Oral BID   insulin  aspart  0-15 Units Subcutaneous Q4H   insulin  glargine-yfgn  20 Units Subcutaneous Daily   isosorbide  mononitrate  30 mg Oral Daily   latanoprost   1 drop Both Eyes QHS   lubiprostone   24 mcg Oral BID WC   montelukast   10 mg Oral QHS   multivitamin  1 tablet Oral QHS   rosuvastatin   40 mg Oral QHS   senna  2 tablet Oral Daily   Continuous Infusions:  sodium chloride  irrigation       LOS: 5 days  MDM: Patient is high risk for one or more organ failure.  They necessitate ongoing hospitalization for continued IV therapies and subsequent lab monitoring. Total time spent interpreting labs and vitals, reviewing the medical record, coordinating care amongst consultants and care team members, directly assessing and discussing care with the patient and/or family: 55 min  Elverta Dimiceli, DO [facility]  To contact the attending physician between 7A-7P please use Epic Chat. To contact the covering physician during after hours 7P-7A, please review Amion.  09/01/2023, 4:33 PM   *This document has been created with the assistance of dictation software. Please excuse typographical errors. *

## 2023-09-02 DIAGNOSIS — G894 Chronic pain syndrome: Secondary | ICD-10-CM | POA: Diagnosis not present

## 2023-09-02 DIAGNOSIS — E663 Overweight: Secondary | ICD-10-CM | POA: Diagnosis not present

## 2023-09-02 DIAGNOSIS — I10 Essential (primary) hypertension: Secondary | ICD-10-CM | POA: Diagnosis not present

## 2023-09-02 DIAGNOSIS — R319 Hematuria, unspecified: Secondary | ICD-10-CM | POA: Diagnosis not present

## 2023-09-02 LAB — CULTURE, BLOOD (ROUTINE X 2)
Culture: NO GROWTH
Culture: NO GROWTH
Special Requests: ADEQUATE
Special Requests: ADEQUATE

## 2023-09-02 LAB — CBC WITH DIFFERENTIAL/PLATELET
Abs Immature Granulocytes: 0.08 K/uL — ABNORMAL HIGH (ref 0.00–0.07)
Basophils Absolute: 0.1 K/uL (ref 0.0–0.1)
Basophils Relative: 1 %
Eosinophils Absolute: 0.3 K/uL (ref 0.0–0.5)
Eosinophils Relative: 5 %
HCT: 34.6 % — ABNORMAL LOW (ref 39.0–52.0)
Hemoglobin: 11.5 g/dL — ABNORMAL LOW (ref 13.0–17.0)
Immature Granulocytes: 1 %
Lymphocytes Relative: 30 %
Lymphs Abs: 2.1 K/uL (ref 0.7–4.0)
MCH: 26.9 pg (ref 26.0–34.0)
MCHC: 33.2 g/dL (ref 30.0–36.0)
MCV: 81 fL (ref 80.0–100.0)
Monocytes Absolute: 0.6 K/uL (ref 0.1–1.0)
Monocytes Relative: 9 %
Neutro Abs: 3.9 K/uL (ref 1.7–7.7)
Neutrophils Relative %: 54 %
Platelets: 199 K/uL (ref 150–400)
RBC: 4.27 MIL/uL (ref 4.22–5.81)
RDW: 14.3 % (ref 11.5–15.5)
WBC: 7.1 K/uL (ref 4.0–10.5)
nRBC: 0 % (ref 0.0–0.2)

## 2023-09-02 LAB — GLUCOSE, CAPILLARY
Glucose-Capillary: 171 mg/dL — ABNORMAL HIGH (ref 70–99)
Glucose-Capillary: 208 mg/dL — ABNORMAL HIGH (ref 70–99)
Glucose-Capillary: 216 mg/dL — ABNORMAL HIGH (ref 70–99)
Glucose-Capillary: 221 mg/dL — ABNORMAL HIGH (ref 70–99)
Glucose-Capillary: 223 mg/dL — ABNORMAL HIGH (ref 70–99)
Glucose-Capillary: 253 mg/dL — ABNORMAL HIGH (ref 70–99)
Glucose-Capillary: 271 mg/dL — ABNORMAL HIGH (ref 70–99)
Glucose-Capillary: 310 mg/dL — ABNORMAL HIGH (ref 70–99)

## 2023-09-02 LAB — COMPREHENSIVE METABOLIC PANEL WITH GFR
ALT: 22 U/L (ref 0–44)
AST: 17 U/L (ref 15–41)
Albumin: 2.8 g/dL — ABNORMAL LOW (ref 3.5–5.0)
Alkaline Phosphatase: 81 U/L (ref 38–126)
Anion gap: 11 (ref 5–15)
BUN: 59 mg/dL — ABNORMAL HIGH (ref 8–23)
CO2: 26 mmol/L (ref 22–32)
Calcium: 9.5 mg/dL (ref 8.9–10.3)
Chloride: 105 mmol/L (ref 98–111)
Creatinine, Ser: 3.02 mg/dL — ABNORMAL HIGH (ref 0.61–1.24)
GFR, Estimated: 22 mL/min — ABNORMAL LOW (ref >60)
Glucose, Bld: 224 mg/dL — ABNORMAL HIGH (ref 70–99)
Potassium: 3.9 mmol/L (ref 3.5–5.1)
Sodium: 142 mmol/L (ref 135–145)
Total Bilirubin: 0.6 mg/dL (ref 0.0–1.2)
Total Protein: 5.9 g/dL — ABNORMAL LOW (ref 6.5–8.1)

## 2023-09-02 LAB — MAGNESIUM: Magnesium: 1.9 mg/dL (ref 1.7–2.4)

## 2023-09-02 LAB — PHOSPHORUS: Phosphorus: 4.1 mg/dL (ref 2.5–4.6)

## 2023-09-02 MED ORDER — INSULIN ASPART 100 UNIT/ML IJ SOLN
7.0000 [IU] | Freq: Three times a day (TID) | INTRAMUSCULAR | Status: DC
  Administered 2023-09-02 – 2023-09-03 (×5): 7 [IU] via SUBCUTANEOUS
  Filled 2023-09-02 (×5): qty 1

## 2023-09-02 MED ORDER — INSULIN GLARGINE-YFGN 100 UNIT/ML ~~LOC~~ SOLN
30.0000 [IU] | Freq: Every day | SUBCUTANEOUS | Status: DC
  Administered 2023-09-02 – 2023-09-03 (×2): 30 [IU] via SUBCUTANEOUS
  Filled 2023-09-02 (×2): qty 0.3

## 2023-09-02 MED ORDER — PHENAZOPYRIDINE HCL 100 MG PO TABS
100.0000 mg | ORAL_TABLET | Freq: Two times a day (BID) | ORAL | Status: AC
  Administered 2023-09-02 (×2): 100 mg via ORAL
  Filled 2023-09-02 (×2): qty 1

## 2023-09-02 NOTE — Progress Notes (Signed)
 I have reviewed and concur with this student's documentation.   Carola Beal, RN 09/02/2023 2:02 PM

## 2023-09-02 NOTE — Progress Notes (Signed)
 PROGRESS NOTE    Kenneth Osborn  FMW:[id number] DOB: [birth year] DOA: 08/26/2023 PCP: Valora Agent, MD  Chief Complaint  Patient presents with   Hematuria    Hospital Course:  Kenneth Osborn 68 year old male with left renal cell carcinoma status post nephrectomy with solitary right kidney, type 2 diabetes, CAD on aspirin .  He presented to [facility] ED on 7/8 with gross hematuria and difficulty initiating urinary stream.  CT abdomen pelvis revealed hyperdense material in the right renal collecting system suspicious for hemorrhagic material.  Urology was consulted and initiated bladder irrigation.  Patient was also found to have AKI superimposed on CKD stage IIIb.  Nephrology was consulted.  Pelvic ultrasound revealed no significant residual clot burden.  On 7/10 patient developed worsening renal failure with hyperkalemia.  He underwent cystoscopy, right ureteral stent placement, right retrograde pyelogram.  Postoperatively he developed acute encephalopathy and metabolic acidosis requiring BiPAP and transferred to the ICU.  He was initiated on CRRT.  On 7/11 he was able to transition off of BiPAP.  On 7/12 CRRT was discontinued.  On 7/13 TRH assumed care.  Subjective: No acute events today.  Patient reiterates his desires to discharge home by Friday.  He has otherwise no acute complaints.  He is on board with removing Foley catheter today and trial of void.  Objective: Vitals:   09/01/23 1628 09/01/23 2031 09/02/23 0302 09/02/23 0812  BP: (!) 154/71 (!) 157/87 (!) 150/75 (!) 162/70  Pulse: 90 91 84 84  Resp: 17 16 16 16   Temp: 98.3 F (36.8 C) 98.2 F (36.8 C) 97.8 F (36.6 C) (!) 97.5 F (36.4 C)  TempSrc:  Oral Oral Oral  SpO2: 98% 96% 94% 98%  Weight:      Height:        Intake/Output Summary (Last 24 hours) at 09/02/2023 1550 Last data filed at 09/02/2023 1524 Gross per 24 hour  Intake 720 ml  Output 1500 ml  Net -780 ml   Filed Weights   08/30/23 0420 08/31/23 0142 09/01/23  0401  Weight: 98.5 kg 96.9 kg 95.2 kg    Examination: General exam: Appears calm and comfortable, NAD  Respiratory system: No work of breathing, symmetric chest wall expansion, 2 L [place] in place Cardiovascular system: S1 & S2 heard, RRR.  Gastrointestinal system: Abdomen is nondistended, soft and nontender.  Neuro: Alert and oriented. No focal neurological deficits. Extremities: Symmetric, expected ROM Skin: No rashes, lesions Psychiatry: Demonstrates appropriate judgement and insight. Mood & affect appropriate for situation. GU: Foley catheter with pink-tinged urine  Assessment & Plan:  Principal Problem:   Hematuria Active Problems:   CAD (coronary artery disease)   HTN (hypertension)   HLD (hyperlipidemia)   Chronic kidney disease, stage 3b (HCC)   Anxiety   Chronic pain syndrome   Overweight (BMI 25.0-29.9)    AKI superimposed on CKD stage IIIb History of left renal cell carcinoma requiring left nephrectomy Solitary right kidney - Status post CRRT during this admission - Nephrology consulted, creatinine improving slowly - Continue to monitor urine output and creatinine - Trend CMP daily - Discontinue right IJ 7/14. - No acute indication for dialysis - Avoid nephrotoxic meds when able  Acute hypoxic respiratory failure - Was initially on BiPAP, has now weaned to nasal cannula - Down to 1 L today.  Suspect now this is secondary to atelectasis.  Encourage incentive spirometry use - Continue to wean O2.  Will likely be on room air tomorrow  Hematuria BPH - 7/10  cystoscopy and right ureteral stent placement - Moderate hydronephrosis with filling defects in the renal pelvis likely secondary to clot. - Cytology still pending - CBI has been discontinued.  Foley output is pink-tinged - Urology still following - Discontinue Foley catheter today for trial of void.  If unsuccessful will replace Foley catheter and discharged with outpatient follow-up instead - Urine culture  negative, discontinue ceftriaxone   Acute blood loss anemia secondary to gross hematuria - Hemoglobin remains stable - Transfuse if under 7 - Hold Effient  and DVT prophylaxis.  SCDs for now.  Abdominal distention Mild ileus - KUB with mild ileus and stool burden - Now having bowel movements.  Continue current bowel regimen  Type 2 diabetes - At home on metformin  and Jardiance.  Holding both in setting of AKI - Hemoglobin A1c 7.4%. - Continue basal/bolus.  Titrate daily.  Have increased both today - Continue to trend CBGs  Electrolyte disturbances Hyperkalemia Hyponatremia - Trend CMP - Replace electrolytes as needed  Acute toxic metabolic encephalopathy - Secondary to metabolic acidosis, azotemia, and anesthesia.  Occurred postoperatively while in the ICU - Presently patient is AO x 4.  His wife reports that he is nearing his mental status baseline - Has been cleared for diet by SLP - Continue working with PT/OT.  Will likely require home health at discharge  Metabolic acidosis - Resolved.  Anxiety Chronic back pain - Continue home meds  CAD - Continue holding Effient  for now given gross hematuria  Hypertension - Blood pressure trending upward now.  Some issues with bradycardia during this admission.  This is now resolved - Avoid nephrotoxic medications.  Will add hydralazine  today.  Hyperlipidemia - Continue statin  History of esophageal dysfunction - Follows with GI outpatient.  DVT prophylaxis: SCDs   Code Status: Full Code Disposition: Likely discharge home within next 48 hours.  Home health has been ordered  Consultants:  Treatment Team:  Consulting Physician: Twylla Glendia BROCKS, MD  Procedures:  Cystoscopy   Antimicrobials:  Anti-infectives (From admission, onward)    Start     Dose/Rate Route Frequency Ordered Stop   08/28/23 1100  cefTRIAXone  (ROCEPHIN ) 1 g in sodium chloride  0.9 % 100 mL IVPB  Status:  Discontinued        1 g 200 mL/hr over 30  Minutes Intravenous Every 24 hours 08/28/23 0900 08/31/23 1506       Data Reviewed: I have personally reviewed following labs and imaging studies CBC: Recent Labs  Lab 08/26/23 1639 08/27/23 0348 08/28/23 1546 08/28/23 1950 08/29/23 0420 08/30/23 0329 08/31/23 0350 09/01/23 0412 09/02/23 0330  WBC 7.7   < > 13.4*  --  11.4* 7.4 6.0 6.3 7.1  NEUTROABS 5.1  --  11.5*  --   --   --   --  3.8 3.9  HGB 14.2   < > 12.9*   < > 12.3* 10.6* 11.2* 11.3* 11.5*  HCT 45.0   < > 39.9   < > 38.2* 33.3* 35.4* 35.4* 34.6*  MCV 83.2   < > 80.8  --  82.2 84.5 82.9 81.9 81.0  PLT 237   < > 165  --  183 136* 179 193 199   < > = values in this interval not displayed.   Basic Metabolic Panel: Recent Labs  Lab 08/29/23 0422 08/29/23 1534 08/30/23 0329 08/31/23 0350 09/01/23 0412 09/02/23 0330  NA 137 137 138 143 139  139 142  K 4.3 4.7 4.3 3.8 3.9  3.9 3.9  CL 101 101 104 106 103  104 105  CO2 21* 26 28 27 27  26 26   GLUCOSE 164* 272* 305* 159* 299*  298* 224*  BUN 45* 42* 32* 48* 57*  57* 59*  CREATININE 5.76* 4.40* 2.89* 3.92* 3.39*  3.53* 3.02*  CALCIUM  8.9 8.6* 8.4* 9.4 9.5  9.5 9.5  MG 1.8  --  2.4  --  2.2 1.9  PHOS 4.7* 4.1  --   --  3.8  3.9 4.1   GFR: Estimated Creatinine Clearance: 26.7 mL/min (A) (by C-G formula based on SCr of 3.02 mg/dL (H)). Liver Function Tests: Recent Labs  Lab 08/26/23 1639 08/28/23 1546 08/29/23 0422 08/29/23 1534 09/01/23 0412 09/02/23 0330  AST 50* 16  --   --  18 17  ALT 55* 30  --   --  23 22  ALKPHOS 92 77  --   --  82 81  BILITOT 0.6 0.6  --   --  0.4 0.6  PROT 7.4 6.5  --   --  6.5 5.9*  ALBUMIN  3.7 3.0* 2.9* 2.9* 2.8*  2.9* 2.8*   CBG: Recent Labs  Lab 09/01/23 2359 09/02/23 0116 09/02/23 0301 09/02/23 0812 09/02/23 1223  GLUCAP 221* 208* 216* 223* 310*    Recent Results (from the past 240 hours)  Culture, blood (Routine X 2) w Reflex to ID Panel     Status: None   Collection Time: 08/28/23  9:27 AM   Specimen:  BLOOD  Result Value Ref Range Status   Specimen Description BLOOD BLOOD RIGHT HAND  Final   Special Requests   Final    BOTTLES DRAWN AEROBIC AND ANAEROBIC Blood Culture adequate volume   Culture   Final    NO GROWTH 5 DAYS Performed at [facility], [address] [place], [place] [address]    Report Status 09/02/2023 FINAL  Final  Culture, blood (Routine X 2) w Reflex to ID Panel     Status: None   Collection Time: 08/28/23  9:32 AM   Specimen: BLOOD  Result Value Ref Range Status   Specimen Description BLOOD BLOOD LEFT HAND  Final   Special Requests   Final    BOTTLES DRAWN AEROBIC AND ANAEROBIC Blood Culture adequate volume   Culture   Final    NO GROWTH 5 DAYS Performed at [facility], [address] [place], [place] [address]    Report Status 09/02/2023 FINAL  Final  Urine Culture     Status: None   Collection Time: 08/28/23 12:41 PM   Specimen: PATH Cytology Urine  Result Value Ref Range Status   Specimen Description   Final    URINE, RANDOM Performed at [facility], [address], [place], [place] [address]    Special Requests   Final     CYTO URINE Performed at [facility], [address], [place], [place] [address]    Culture   Final    NO GROWTH Performed at [facility], [address], [place], [place] [address]    Report Status 08/30/2023 FINAL  Final     Radiology Studies: No results found.   Scheduled Meds:  Chlorhexidine  Gluconate Cloth  6 each Topical Daily   diltiazem   240 mg Oral Daily   gabapentin   300 mg Oral BID   hydrALAZINE   10 mg Oral Q8H   insulin  aspart  0-15 Units Subcutaneous Q4H   insulin  aspart  7 Units Subcutaneous TID  WC   insulin  glargine-yfgn  30 Units Subcutaneous Daily   isosorbide  mononitrate  30 mg Oral Daily   latanoprost   1 drop Both Eyes QHS   lubiprostone   24 mcg Oral BID WC   montelukast   10 mg Oral QHS   multivitamin  1 tablet Oral QHS   phenazopyridine   100 mg  Oral BID   rosuvastatin   40 mg Oral QHS   senna  2 tablet Oral Daily   Continuous Infusions:  sodium chloride  irrigation       LOS: 6 days  MDM: Patient is high risk for one or more organ failure.  They necessitate ongoing hospitalization for continued IV therapies and subsequent lab monitoring. Total time spent interpreting labs and vitals, reviewing the medical record, coordinating care amongst consultants and care team members, directly assessing and discussing care with the patient and/or family: 55 min  Iosefa Weintraub, DO [facility]  To contact the attending physician between 7A-7P please use Epic Chat. To contact the covering physician during after hours 7P-7A, please review Amion.  09/02/2023, 3:50 PM   *This document has been created with the assistance of dictation software. Please excuse typographical errors. *

## 2023-09-02 NOTE — Progress Notes (Signed)
 Physical Therapy Treatment Patient Details Name: Kenneth Osborn MRN: [id number] DOB: [birth year] Today's Date: 09/02/2023   History of Present Illness 68 y.o. male with past medical history of renal cell carcinoma status post left nephrectomy, chronic kidney disease stage IIIb baseline EGFR 38, coronary disease with history of stent placement on Effient , hypertension, lipidemia, diabetes mellitus type 2, anxiety, chronic back pain, Mnire's disease who presented with hematuria and status post cystoscopy, right ureteral stent placement, and retrograde pyelography.    PT Comments  OOB and completes x 3 laps on unit with RW and cga/supervision.  Progressing well with mobility.  Occasional cues for general safety but no outside intervention needed.  Pt comfortable with mobility skills.  Remained in recliner after session with wife and needs met.   If plan is discharge home, recommend the following: A little help with walking and/or transfers;A little help with bathing/dressing/bathroom;Assistance with cooking/housework;Assist for transportation;Help with stairs or ramp for entrance   Can travel by private vehicle        Equipment Recommendations  None recommended by PT    Recommendations for Other Services       Precautions / Restrictions Precautions Precautions: Fall Restrictions Weight Bearing Restrictions Per Provider Order: No     Mobility  Bed Mobility Overal bed mobility: Modified Independent               Patient Response: Cooperative  Transfers Overall transfer level: Needs assistance Equipment used: Rolling walker (2 wheels) Transfers: Sit to/from Stand Sit to Stand: Contact guard assist                Ambulation/Gait Ambulation/Gait assistance: Contact guard assist Gait Distance (Feet): 550 Feet Assistive device: Rolling walker (2 wheels) Gait Pattern/deviations: Step-through pattern Gait velocity: WFL     General Gait Details: generally steady  without LOB   Stairs             Wheelchair Mobility     Tilt Bed Tilt Bed Patient Response: Cooperative  Modified Rankin (Stroke Patients Only)       Balance Overall balance assessment: Needs assistance Sitting-balance support: Bilateral upper extremity supported Sitting balance-Leahy Scale: Good     Standing balance support: Bilateral upper extremity supported Standing balance-Leahy Scale: Fair Standing balance comment: tending to keep walker too far away with increased gait distance, consistent cuing to correct                            Communication Communication Communication: No apparent difficulties  Cognition Arousal: Alert Behavior During Therapy: WFL for tasks assessed/performed   PT - Cognitive impairments: No apparent impairments                       PT - Cognition Comments: very talkative Following commands: Intact      Cueing Cueing Techniques: Verbal cues  Exercises      General Comments        Pertinent Vitals/Pain Pain Assessment Pain Assessment: No/denies pain    Home Living                          Prior Function            PT Goals (current goals can now be found in the care plan section) Progress towards PT goals: Progressing toward goals    Frequency    Min 2X/week      PT  Plan      Co-evaluation              AM-PAC PT 6 Clicks Mobility   Outcome Measure  Help needed turning from your back to your side while in a flat bed without using bedrails?: None Help needed moving from lying on your back to sitting on the side of a flat bed without using bedrails?: None Help needed moving to and from a bed to a chair (including a wheelchair)?: A Little Help needed standing up from a chair using your arms (e.g., wheelchair or bedside chair)?: A Little Help needed to walk in hospital room?: A Little Help needed climbing 3-5 steps with a railing? : A Little 6 Click Score: 20     End of Session Equipment Utilized During Treatment: Gait belt Activity Tolerance: Patient tolerated treatment well Patient left: in bed;with call bell/phone within reach;with bed alarm set Nurse Communication: Mobility status PT Visit Diagnosis: Muscle weakness (generalized) (M62.81);Difficulty in walking, not elsewhere classified (R26.2)     Time: 1125-1140 PT Time Calculation (min) (ACUTE ONLY): 15 min  Charges:    $Gait Training: 8-22 mins PT General Charges $$ ACUTE PT VISIT: 1 Visit                   Lauraine Gills, PTA 09/02/23, 1:02 PM

## 2023-09-02 NOTE — Plan of Care (Signed)

## 2023-09-02 NOTE — Progress Notes (Signed)
   09/02/23 1445  Spiritual Encounters  Type of Visit Initial  Care provided to: Pt and family  Referral source Chaplain assessment  Reason for visit Routine spiritual support  Spiritual Framework  Presenting Themes Meaning/purpose/sources of inspiration;Community and relationships  Interventions  Spiritual Care Interventions Made Established relationship of care and support;Compassionate presence;Reflective listening;Narrative/life review;Prayer  Intervention Outcomes  Outcomes Connection to spiritual care;Awareness around self/spiritual resourses  Spiritual Care Plan  Spiritual Care Issues Still Outstanding No further spiritual care needs at this time (see row info)   Chaplain stepped into pt's room to see if wife, Heron, would be willing to be a witness for an AD. 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 We talked about people we have in common through the COG. Chaplain then prayed for pt and wife as they walk through this current illness.

## 2023-09-02 NOTE — Inpatient Diabetes Management (Signed)
 Inpatient Diabetes Program Recommendations  AACE/ADA: New Consensus Statement on Inpatient Glycemic Control   Target Ranges:  Prepandial:   less than 140 mg/dL      Peak postprandial:   less than 180 mg/dL (1-2 hours)      Critically ill patients:  140 - 180 mg/dL    Latest Reference Range & Units 09/01/23 07:40 09/01/23 11:28 09/01/23 11:31 09/01/23 15:33 09/01/23 17:37 09/01/23 21:26 09/01/23 23:59 09/02/23 01:16 09/02/23 03:01  Glucose-Capillary 70 - 99 mg/dL 730 (H) 570 (H) 534 (H) 379 (H) 315 (H) 237 (H) 221 (H) 208 (H) 216 (H)    Review of Glycemic Control  Diabetes history: DM2 Outpatient Diabetes medications: Humulin  R U500 65-75 units TID, Jardiance 25 mg daily, Metformin  1000 mg BID Current orders for Inpatient glycemic control: Semglee  20 units daily, Novolog  4 units TID with meals, Novolog  0-15 units Q4H   Inpatient Diabetes Program Recommendations:     Insulin : Please consider increasing Semglee  to 30 units daily and meal coverage to Novolog  7 units TID with meals.  Thanks, Earnie Gainer, RN, MSN, CDCES Diabetes Coordinator Inpatient Diabetes Program [phone] (Team Pager from 8am to 5pm)

## 2023-09-02 NOTE — Progress Notes (Signed)
 [facility]  ROUNDING NOTE   Subjective:   Patient well-known to us  from office f/u for chronic kidney disease in the setting of left nephrectomy.  He presented this time with gross hematuria.  He was found to have new right-sided hydronephrosis with hyperdense material in the right renal collecting system. He underwent cystoscopy and right retrograde pyelography this admission on 7/10 by Dr Twylla. Right ureteral stent placed.  Patient seen sitting up in bed Friends at bedside Remains on 1 L nasal cannula HD temp catheter removed at right IJ, dressing clean dry and intact Requesting Foley catheter be removed  Urine output 1.5 L recorded this shift thus far.  Objective:  Vital signs in last 24 hours:  Temp:  [97.5 F (36.4 C)-98.3 F (36.8 C)] 97.5 F (36.4 C) (07/15 0812) Pulse Rate:  [84-105] 84 (07/15 0812) Resp:  [12-25] 16 (07/15 0812) BP: (150-172)/(70-133) 162/70 (07/15 0812) SpO2:  [92 %-98 %] 98 % (07/15 0812)  Weight change:  Filed Weights   08/30/23 0420 08/31/23 0142 09/01/23 0401  Weight: 98.5 kg 96.9 kg 95.2 kg    Intake/Output: I/O last 3 completed shifts: In: 800 [P.O.:800] Out: 2600 [Urine:2600]   Intake/Output this shift:  Total I/O In: 480 [P.O.:480] Out: 1500 [Urine:1500]  Physical Exam: General: No acute distress  Head: Normocephalic, atraumatic. Moist oral mucosal membranes  Neck: Supple  Lungs:  Clear to auscultation, normal effort, mild crackles at bases  Heart: S1S2 no rubs  Abdomen:  Soft, nontender, bowel sounds present  Extremities: Trace peripheral edema.  Neurologic: Awake, alert, following commands  Skin: No acute rash  Access: None  Foley catheter in place  Basic Metabolic Panel: Recent Labs  Lab 08/29/23 0422 08/29/23 1534 08/30/23 0329 08/31/23 0350 09/01/23 0412 09/02/23 0330  NA 137 137 138 143 139  139 142  K 4.3 4.7 4.3 3.8 3.9  3.9 3.9  CL 101 101 104 106 103  104 105  CO2 21* 26 28 27 27  26  26   GLUCOSE 164* 272* 305* 159* 299*  298* 224*  BUN 45* 42* 32* 48* 57*  57* 59*  CREATININE 5.76* 4.40* 2.89* 3.92* 3.39*  3.53* 3.02*  CALCIUM  8.9 8.6* 8.4* 9.4 9.5  9.5 9.5  MG 1.8  --  2.4  --  2.2 1.9  PHOS 4.7* 4.1  --   --  3.8  3.9 4.1    Liver Function Tests: Recent Labs  Lab 08/26/23 1639 08/28/23 1546 08/29/23 0422 08/29/23 1534 09/01/23 0412 09/02/23 0330  AST 50* 16  --   --  18 17  ALT 55* 30  --   --  23 22  ALKPHOS 92 77  --   --  82 81  BILITOT 0.6 0.6  --   --  0.4 0.6  PROT 7.4 6.5  --   --  6.5 5.9*  ALBUMIN  3.7 3.0* 2.9* 2.9* 2.8*  2.9* 2.8*   No results for input(s): LIPASE, AMYLASE in the last 168 hours. No results for input(s): AMMONIA in the last 168 hours.  CBC: Recent Labs  Lab 08/26/23 1639 08/27/23 0348 08/28/23 1546 08/28/23 1950 08/29/23 0420 08/30/23 0329 08/31/23 0350 09/01/23 0412 09/02/23 0330  WBC 7.7   < > 13.4*  --  11.4* 7.4 6.0 6.3 7.1  NEUTROABS 5.1  --  11.5*  --   --   --   --  3.8 3.9  HGB 14.2   < > 12.9*   < >  12.3* 10.6* 11.2* 11.3* 11.5*  HCT 45.0   < > 39.9   < > 38.2* 33.3* 35.4* 35.4* 34.6*  MCV 83.2   < > 80.8  --  82.2 84.5 82.9 81.9 81.0  PLT 237   < > 165  --  183 136* 179 193 199   < > = values in this interval not displayed.    Cardiac Enzymes: No results for input(s): CKTOTAL, CKMB, CKMBINDEX, TROPONINI in the last 168 hours.  BNP: Invalid input(s): POCBNP  CBG: Recent Labs  Lab 09/01/23 2126 09/01/23 2359 09/02/23 0116 09/02/23 0301 09/02/23 0812  GLUCAP 237* 221* 208* 216* 223*    Microbiology: Results for orders placed or performed during the hospital encounter of 08/26/23  Culture, blood (Routine X 2) w Reflex to ID Panel     Status: None   Collection Time: 08/28/23  9:27 AM   Specimen: BLOOD  Result Value Ref Range Status   Specimen Description BLOOD BLOOD RIGHT HAND  Final   Special Requests   Final    BOTTLES DRAWN AEROBIC AND ANAEROBIC Blood Culture  adequate volume   Culture   Final    NO GROWTH 5 DAYS Performed at [facility], [address] [place], [place] [address]    Report Status 09/02/2023 FINAL  Final  Culture, blood (Routine X 2) w Reflex to ID Panel     Status: None   Collection Time: 08/28/23  9:32 AM   Specimen: BLOOD  Result Value Ref Range Status   Specimen Description BLOOD BLOOD LEFT HAND  Final   Special Requests   Final    BOTTLES DRAWN AEROBIC AND ANAEROBIC Blood Culture adequate volume   Culture   Final    NO GROWTH 5 DAYS Performed at [facility], [address] [place], [place] [address]    Report Status 09/02/2023 FINAL  Final  Urine Culture     Status: None   Collection Time: 08/28/23 12:41 PM   Specimen: PATH Cytology Urine  Result Value Ref Range Status   Specimen Description   Final    URINE, RANDOM Performed at [facility], [address], [place], [place] [address]    Special Requests   Final     CYTO URINE Performed at [facility], [address], [place], [place] [address]    Culture   Final    NO GROWTH Performed at [facility], [address], [place], [place] [address]    Report Status 08/30/2023 FINAL  Final    Coagulation Studies: No results for input(s): LABPROT, INR in the last 72 hours.   Urinalysis: No results for input(s): COLORURINE, LABSPEC, PHURINE, GLUCOSEU, HGBUR, BILIRUBINUR, KETONESUR, PROTEINUR, UROBILINOGEN, NITRITE, LEUKOCYTESUR in the last 72 hours.  Invalid input(s): APPERANCEUR     Imaging: No results found.    Medications:    sodium chloride  irrigation      Chlorhexidine  Gluconate Cloth  6 each Topical Daily   diltiazem   240 mg Oral Daily   feeding supplement  237 mL Oral TID BM   gabapentin   300 mg Oral BID   hydrALAZINE   10 mg Oral Q8H   insulin  aspart  0-15 Units Subcutaneous Q4H   insulin  aspart  7 Units Subcutaneous TID WC   insulin  glargine-yfgn  30 Units  Subcutaneous Daily   isosorbide  mononitrate  30 mg Oral Daily   latanoprost   1 drop Both Eyes QHS   lubiprostone   24 mcg Oral BID WC  montelukast   10 mg Oral QHS   multivitamin  1 tablet Oral QHS   rosuvastatin   40 mg Oral QHS   senna  2 tablet Oral Daily   acetaminophen , albuterol , ALPRAZolam , hydrALAZINE , labetalol , nitroGLYCERIN , ondansetron  (ZOFRAN ) IV, oxyCODONE   Assessment/ Plan:  68 y.o. male with past medical history of renal cell carcinoma status post left nephrectomy, chronic kidney disease stage IIIb baseline EGFR 38, coronary disease with history of stent placement on Effient , hypertension, lipidemia, diabetes mellitus type 2, anxiety, chronic back pain, Mnire's disease who presented with hematuria and status post cystoscopy, right ureteral stent placement, and retrograde pyelography.  1.  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  The patient's acute kidney injury appears to be secondary to new right-sided hydronephrosis which was due to right renal bleeding.  Patient status post cystoscopy and right retrograde pyelography on 08/28/2023.  Given severe hyperkalemia patient was started on CRRT.  Appreciate nursing staff removing HD temp cath yesterday.  Renal indices continue to improve with adequate urine output.  No acute indication for dialysis.  Will defer Foley catheter management to primary team.  Will continue to monitor.  2.  Hyperkalemia.   Potassium corrected  3.  Type 2 diabetes with CKD Hemoglobin A1c 7.4% from 11/01/2021 Outpatient regimen included Jardiance, zetia  Agree with d/c metformin .   Glucose levels have been elevated.  Primary team to continue management of sliding scale insulin .    LOS: 6 Keishawn Rajewski 7/15/202511:59 AM

## 2023-09-02 NOTE — Plan of Care (Signed)
  Problem: Education: Goal: Ability to describe self-care measures that may prevent or decrease complications (Diabetes Survival Skills Education) will improve Outcome: Progressing Goal: Individualized Educational Video(s) Outcome: Progressing   Problem: Coping: Goal: Ability to adjust to condition or change in health will improve Outcome: Progressing   Problem: Health Behavior/Discharge Planning: Goal: Ability to identify and utilize available resources and services will improve Outcome: Progressing Goal: Ability to manage health-related needs will improve Outcome: Progressing   Problem: Metabolic: Goal: Ability to maintain appropriate glucose levels will improve Outcome: Progressing   Problem: Nutritional: Goal: Maintenance of adequate nutrition will improve Outcome: Progressing Goal: Progress toward achieving an optimal weight will improve Outcome: Progressing   Problem: Skin Integrity: Goal: Risk for impaired skin integrity will decrease Outcome: Progressing   Problem: Tissue Perfusion: Goal: Adequacy of tissue perfusion will improve Outcome: Progressing   Problem: Education: Goal: Knowledge of General Education information will improve Description: Including pain rating scale, medication(s)/side effects and non-pharmacologic comfort measures Outcome: Progressing   Problem: Clinical Measurements: Goal: Ability to maintain clinical measurements within normal limits will improve Outcome: Progressing Goal: Will remain free from infection Outcome: Progressing Goal: Diagnostic test results will improve Outcome: Progressing Goal: Respiratory complications will improve Outcome: Progressing Goal: Cardiovascular complication will be avoided Outcome: Progressing   Problem: Health Behavior/Discharge Planning: Goal: Ability to manage health-related needs will improve Outcome: Progressing

## 2023-09-03 ENCOUNTER — Other Ambulatory Visit: Payer: Self-pay

## 2023-09-03 LAB — CBC WITH DIFFERENTIAL/PLATELET
Abs Immature Granulocytes: 0.13 K/uL — ABNORMAL HIGH (ref 0.00–0.07)
Basophils Absolute: 0.1 K/uL (ref 0.0–0.1)
Basophils Relative: 1 %
Eosinophils Absolute: 0.4 K/uL (ref 0.0–0.5)
Eosinophils Relative: 4 %
HCT: 36.5 % — ABNORMAL LOW (ref 39.0–52.0)
Hemoglobin: 12.2 g/dL — ABNORMAL LOW (ref 13.0–17.0)
Immature Granulocytes: 1 %
Lymphocytes Relative: 34 %
Lymphs Abs: 3 K/uL (ref 0.7–4.0)
MCH: 26.8 pg (ref 26.0–34.0)
MCHC: 33.4 g/dL (ref 30.0–36.0)
MCV: 80 fL (ref 80.0–100.0)
Monocytes Absolute: 0.8 K/uL (ref 0.1–1.0)
Monocytes Relative: 8 %
Neutro Abs: 4.7 K/uL (ref 1.7–7.7)
Neutrophils Relative %: 52 %
Platelets: 263 K/uL (ref 150–400)
RBC: 4.56 MIL/uL (ref 4.22–5.81)
RDW: 14.1 % (ref 11.5–15.5)
WBC: 9.1 K/uL (ref 4.0–10.5)
nRBC: 0 % (ref 0.0–0.2)

## 2023-09-03 LAB — COMPREHENSIVE METABOLIC PANEL WITH GFR
ALT: 30 U/L (ref 0–44)
AST: 31 U/L (ref 15–41)
Albumin: 2.9 g/dL — ABNORMAL LOW (ref 3.5–5.0)
Alkaline Phosphatase: 85 U/L (ref 38–126)
Anion gap: 9 (ref 5–15)
BUN: 54 mg/dL — ABNORMAL HIGH (ref 8–23)
CO2: 27 mmol/L (ref 22–32)
Calcium: 9.8 mg/dL (ref 8.9–10.3)
Chloride: 108 mmol/L (ref 98–111)
Creatinine, Ser: 2.45 mg/dL — ABNORMAL HIGH (ref 0.61–1.24)
GFR, Estimated: 28 mL/min — ABNORMAL LOW (ref >60)
Glucose, Bld: 108 mg/dL — ABNORMAL HIGH (ref 70–99)
Potassium: 3.6 mmol/L (ref 3.5–5.1)
Sodium: 144 mmol/L (ref 135–145)
Total Bilirubin: 0.4 mg/dL (ref 0.0–1.2)
Total Protein: 6.2 g/dL — ABNORMAL LOW (ref 6.5–8.1)

## 2023-09-03 LAB — GLUCOSE, CAPILLARY
Glucose-Capillary: 115 mg/dL — ABNORMAL HIGH (ref 70–99)
Glucose-Capillary: 189 mg/dL — ABNORMAL HIGH (ref 70–99)
Glucose-Capillary: 227 mg/dL — ABNORMAL HIGH (ref 70–99)
Glucose-Capillary: 361 mg/dL — ABNORMAL HIGH (ref 70–99)

## 2023-09-03 LAB — PHOSPHORUS: Phosphorus: 4 mg/dL (ref 2.5–4.6)

## 2023-09-03 LAB — MAGNESIUM: Magnesium: 1.8 mg/dL (ref 1.7–2.4)

## 2023-09-03 MED ORDER — INSULIN ASPART 100 UNIT/ML IJ SOLN: 0.0000 [IU] | Freq: Every day | INTRAMUSCULAR | Status: DC

## 2023-09-03 MED ORDER — OXYBUTYNIN CHLORIDE 5 MG PO TABS
5.0000 mg | ORAL_TABLET | Freq: Three times a day (TID) | ORAL | 0 refills | Status: DC | PRN
  Filled 2023-09-03: qty 30, 10d supply, fill #0

## 2023-09-03 MED ORDER — RENA-VITE PO TABS
1.0000 | ORAL_TABLET | Freq: Every day | ORAL | 0 refills | Status: DC
  Filled 2023-09-03: qty 30, 30d supply, fill #0

## 2023-09-03 MED ORDER — ACETAMINOPHEN 325 MG PO TABS: 325.0000 mg | ORAL_TABLET | Freq: Four times a day (QID) | ORAL | Status: AC | PRN

## 2023-09-03 MED ORDER — RENA-VITE PO TABS: 1.0000 | ORAL_TABLET | Freq: Every day | ORAL | 0 refills | Status: AC

## 2023-09-03 MED ORDER — OXYBUTYNIN CHLORIDE 5 MG PO TABS: 5.0000 mg | ORAL_TABLET | Freq: Three times a day (TID) | ORAL | 0 refills | Status: AC | PRN

## 2023-09-03 MED ORDER — INSULIN ASPART 100 UNIT/ML IJ SOLN: 0.0000 [IU] | Freq: Three times a day (TID) | INTRAMUSCULAR | Status: DC

## 2023-09-03 MED ORDER — TAMSULOSIN HCL 0.4 MG PO CAPS
0.4000 mg | ORAL_CAPSULE | Freq: Every day | ORAL | 0 refills | Status: DC
  Filled 2023-09-03: qty 30, 30d supply, fill #0

## 2023-09-03 MED ORDER — INSULIN ASPART 100 UNIT/ML IJ SOLN
0.0000 [IU] | Freq: Three times a day (TID) | INTRAMUSCULAR | Status: DC
  Administered 2023-09-03: 5 [IU] via SUBCUTANEOUS
  Filled 2023-09-03: qty 1

## 2023-09-03 MED ORDER — TAMSULOSIN HCL 0.4 MG PO CAPS
0.4000 mg | ORAL_CAPSULE | Freq: Every day | ORAL | 0 refills | Status: AC
Start: 2023-09-03 — End: ?

## 2023-09-03 NOTE — Progress Notes (Signed)
 [facility]  ROUNDING NOTE   Subjective:   Patient well-known to us  from office f/u for chronic kidney disease in the setting of left nephrectomy.  He presented this time with gross hematuria.  He was found to have new right-sided hydronephrosis with hyperdense material in the right renal collecting system. He underwent cystoscopy and right retrograde pyelography this admission on 7/10 by Dr Twylla. Right ureteral stent placed.  Patient sitting up in bed Appears in good spirits Tolerating meals without nausea or vomiting Weaned to room air  Creatinine 2.45 Urine output 2.15 L in previous 24 hours.  Objective:  Vital signs in last 24 hours:  Temp:  [97.6 F (36.4 C)-98.3 F (36.8 C)] 97.6 F (36.4 C) (07/16 0347) Pulse Rate:  [86-94] 86 (07/16 1311) Resp:  [18-20] 19 (07/16 1311) BP: (150-164)/(73-86) 152/84 (07/16 1311) SpO2:  [93 %-96 %] 93 % (07/16 0813) Weight:  [99.7 kg] 99.7 kg (07/16 0500)  Weight change:  Filed Weights   08/31/23 0142 09/01/23 0401 09/03/23 0500  Weight: 96.9 kg 95.2 kg 99.7 kg    Intake/Output: I/O last 3 completed shifts: In: 1200 [P.O.:1200] Out: 2150 [Urine:2150]   Intake/Output this shift:  Total I/O In: -  Out: 350 [Urine:350]  Physical Exam: General: No acute distress  Head: Normocephalic, atraumatic. Moist oral mucosal membranes  Neck: Supple  Lungs:  Clear to auscultation, normal effort, mild crackles at bases  Heart: S1S2 no rubs  Abdomen:  Soft, nontender, bowel sounds present  Extremities: Trace peripheral edema.  Neurologic: Awake, alert, following commands  Skin: No acute rash  Access: None    Basic Metabolic Panel: Recent Labs  Lab 08/29/23 0422 08/29/23 1534 08/30/23 0329 08/31/23 0350 09/01/23 0412 09/02/23 0330 09/03/23 0404  NA 137 137 138 143 139  139 142 144  K 4.3 4.7 4.3 3.8 3.9  3.9 3.9 3.6  CL 101 101 104 106 103  104 105 108  CO2 21* 26 28 27 27  26 26 27   GLUCOSE 164* 272* 305*  159* 299*  298* 224* 108*  BUN 45* 42* 32* 48* 57*  57* 59* 54*  CREATININE 5.76* 4.40* 2.89* 3.92* 3.39*  3.53* 3.02* 2.45*  CALCIUM  8.9 8.6* 8.4* 9.4 9.5  9.5 9.5 9.8  MG 1.8  --  2.4  --  2.2 1.9 1.8  PHOS 4.7* 4.1  --   --  3.8  3.9 4.1 4.0    Liver Function Tests: Recent Labs  Lab 08/28/23 1546 08/29/23 0422 08/29/23 1534 09/01/23 0412 09/02/23 0330 09/03/23 0404  AST 16  --   --  18 17 31   ALT 30  --   --  23 22 30   ALKPHOS 77  --   --  82 81 85  BILITOT 0.6  --   --  0.4 0.6 0.4  PROT 6.5  --   --  6.5 5.9* 6.2*  ALBUMIN  3.0* 2.9* 2.9* 2.8*  2.9* 2.8* 2.9*   No results for input(s): LIPASE, AMYLASE in the last 168 hours. No results for input(s): AMMONIA in the last 168 hours.  CBC: Recent Labs  Lab 08/28/23 1546 08/28/23 1950 08/30/23 0329 08/31/23 0350 09/01/23 0412 09/02/23 0330 09/03/23 0404  WBC 13.4*   < > 7.4 6.0 6.3 7.1 9.1  NEUTROABS 11.5*  --   --   --  3.8 3.9 4.7  HGB 12.9*   < > 10.6* 11.2* 11.3* 11.5* 12.2*  HCT 39.9   < > 33.3* 35.4*  35.4* 34.6* 36.5*  MCV 80.8   < > 84.5 82.9 81.9 81.0 80.0  PLT 165   < > 136* 179 193 199 263   < > = values in this interval not displayed.    Cardiac Enzymes: No results for input(s): CKTOTAL, CKMB, CKMBINDEX, TROPONINI in the last 168 hours.  BNP: Invalid input(s): POCBNP  CBG: Recent Labs  Lab 09/02/23 2348 09/03/23 0345 09/03/23 0811 09/03/23 0929 09/03/23 1155  GLUCAP 171* 115* 189* 227* 361*    Microbiology: Results for orders placed or performed during the hospital encounter of 08/26/23  Culture, blood (Routine X 2) w Reflex to ID Panel     Status: None   Collection Time: 08/28/23  9:27 AM   Specimen: BLOOD  Result Value Ref Range Status   Specimen Description BLOOD BLOOD RIGHT HAND  Final   Special Requests   Final    BOTTLES DRAWN AEROBIC AND ANAEROBIC Blood Culture adequate volume   Culture   Final    NO GROWTH 5 DAYS Performed at [facility], [address] [place], [place] [address]    Report Status 09/02/2023 FINAL  Final  Culture, blood (Routine X 2) w Reflex to ID Panel     Status: None   Collection Time: 08/28/23  9:32 AM   Specimen: BLOOD  Result Value Ref Range Status   Specimen Description BLOOD BLOOD LEFT HAND  Final   Special Requests   Final    BOTTLES DRAWN AEROBIC AND ANAEROBIC Blood Culture adequate volume   Culture   Final    NO GROWTH 5 DAYS Performed at [facility], [address] [place], [place] [address]    Report Status 09/02/2023 FINAL  Final  Urine Culture     Status: None   Collection Time: 08/28/23 12:41 PM   Specimen: PATH Cytology Urine  Result Value Ref Range Status   Specimen Description   Final    URINE, RANDOM Performed at [facility], [address] [place], [place] [address]    Special Requests   Final     CYTO URINE Performed at [facility], [address], [place], [place] [address]    Culture   Final    NO GROWTH Performed at [facility], [address], [place], [place] [address]    Report Status 08/30/2023 FINAL  Final    Coagulation Studies: No results for input(s): LABPROT, INR in the last 72 hours.   Urinalysis: No results for input(s): COLORURINE, LABSPEC, PHURINE, GLUCOSEU, HGBUR, BILIRUBINUR, KETONESUR, PROTEINUR, UROBILINOGEN, NITRITE, LEUKOCYTESUR in the last 72 hours.  Invalid input(s): APPERANCEUR     Imaging: No results found.    Medications:    sodium chloride  irrigation      Chlorhexidine  Gluconate Cloth  6 each Topical Daily   diltiazem   240 mg Oral Daily   gabapentin   300 mg Oral BID   hydrALAZINE   10 mg Oral Q8H   insulin  aspart  0-15 Units Subcutaneous TID WC   insulin  aspart  0-5 Units Subcutaneous QHS   insulin  aspart  7 Units Subcutaneous TID WC   insulin  glargine-yfgn  30 Units Subcutaneous Daily   isosorbide  mononitrate  30 mg Oral Daily   latanoprost   1 drop Both  Eyes QHS   lubiprostone   24 mcg Oral BID WC   montelukast   10 mg Oral QHS   multivitamin  1 tablet Oral QHS   rosuvastatin   40 mg Oral QHS   senna  2 tablet Oral Daily   acetaminophen , albuterol , ALPRAZolam , hydrALAZINE , labetalol , nitroGLYCERIN , ondansetron  (ZOFRAN ) IV, oxyCODONE   Assessment/ Plan:  68 y.o. male with past medical history of renal cell carcinoma status post left nephrectomy, chronic kidney disease stage IIIb baseline EGFR 38, coronary disease with history of stent placement on Effient , hypertension, lipidemia, diabetes mellitus type 2, anxiety, chronic back pain, Mnire's disease who presented with hematuria and status post cystoscopy, right ureteral stent placement, and retrograde pyelography.  1.  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  The patient's acute kidney injury appears to be secondary to new right-sided hydronephrosis which was due to right renal bleeding.  Patient status post cystoscopy and right retrograde pyelography on 08/28/2023.  Given severe hyperkalemia patient was started on CRRT.  Creatinine continues to improve. Foley catheter removed. Adequate urine output recorded.   2.  Hyperkalemia.   Potassium corrected  3.  Type 2 diabetes with CKD Hemoglobin A1c 7.4% from 11/01/2021 Outpatient regimen included Jardiance, zetia  Agree with d/c metformin .   Glucose elevated.  Primary team to continue management of sliding scale insulin .  Due to continued renal recovery, we will sign off at this time. Will schedule follow up appt in our office in 2-3 weeks.     LOS: 7 Verneice Caspers 7/16/20252:13 PM

## 2023-09-03 NOTE — Progress Notes (Signed)
 [facility]  ROUNDING NOTE   Subjective:   Patient well-known to us  from office f/u for chronic kidney disease in the setting of left nephrectomy.  He presented this time with gross hematuria.  He was found to have new right-sided hydronephrosis with hyperdense material in the right renal collecting system. He underwent cystoscopy and right retrograde pyelography this admission on 7/10 by Dr Twylla. Right ureteral stent placed.  Patient sitting up in bed Appears in good spirits Tolerating meals without nausea or vomiting Weaned to room air  Creatinine 2.45 Urine output 2.15 L in previous 24 hours.  Objective:  Vital signs in last 24 hours:  Temp:  [97.6 F (36.4 C)-98.3 F (36.8 C)] 97.6 F (36.4 C) (07/16 0347) Pulse Rate:  [87-94] 90 (07/16 0813) Resp:  [18-20] 20 (07/16 0813) BP: (150-164)/(73-86) 158/86 (07/16 0813) SpO2:  [93 %-96 %] 93 % (07/16 0813) Weight:  [99.7 kg] 99.7 kg (07/16 0500)  Weight change:  Filed Weights   08/31/23 0142 09/01/23 0401 09/03/23 0500  Weight: 96.9 kg 95.2 kg 99.7 kg    Intake/Output: I/O last 3 completed shifts: In: 1200 [P.O.:1200] Out: 2150 [Urine:2150]   Intake/Output this shift:  Total I/O In: -  Out: 350 [Urine:350]  Physical Exam: General: No acute distress  Head: Normocephalic, atraumatic. Moist oral mucosal membranes  Neck: Supple  Lungs:  Clear to auscultation, normal effort, mild crackles at bases  Heart: S1S2 no rubs  Abdomen:  Soft, nontender, bowel sounds present  Extremities: Trace peripheral edema.  Neurologic: Awake, alert, following commands  Skin: No acute rash  Access: None    Basic Metabolic Panel: Recent Labs  Lab 08/29/23 0422 08/29/23 1534 08/30/23 0329 08/31/23 0350 09/01/23 0412 09/02/23 0330 09/03/23 0404  NA 137 137 138 143 139  139 142 144  K 4.3 4.7 4.3 3.8 3.9  3.9 3.9 3.6  CL 101 101 104 106 103  104 105 108  CO2 21* 26 28 27 27  26 26 27   GLUCOSE 164* 272* 305*  159* 299*  298* 224* 108*  BUN 45* 42* 32* 48* 57*  57* 59* 54*  CREATININE 5.76* 4.40* 2.89* 3.92* 3.39*  3.53* 3.02* 2.45*  CALCIUM  8.9 8.6* 8.4* 9.4 9.5  9.5 9.5 9.8  MG 1.8  --  2.4  --  2.2 1.9 1.8  PHOS 4.7* 4.1  --   --  3.8  3.9 4.1 4.0    Liver Function Tests: Recent Labs  Lab 08/28/23 1546 08/29/23 0422 08/29/23 1534 09/01/23 0412 09/02/23 0330 09/03/23 0404  AST 16  --   --  18 17 31   ALT 30  --   --  23 22 30   ALKPHOS 77  --   --  82 81 85  BILITOT 0.6  --   --  0.4 0.6 0.4  PROT 6.5  --   --  6.5 5.9* 6.2*  ALBUMIN  3.0* 2.9* 2.9* 2.8*  2.9* 2.8* 2.9*   No results for input(s): LIPASE, AMYLASE in the last 168 hours. No results for input(s): AMMONIA in the last 168 hours.  CBC: Recent Labs  Lab 08/28/23 1546 08/28/23 1950 08/30/23 0329 08/31/23 0350 09/01/23 0412 09/02/23 0330 09/03/23 0404  WBC 13.4*   < > 7.4 6.0 6.3 7.1 9.1  NEUTROABS 11.5*  --   --   --  3.8 3.9 4.7  HGB 12.9*   < > 10.6* 11.2* 11.3* 11.5* 12.2*  HCT 39.9   < > 33.3* 35.4*  35.4* 34.6* 36.5*  MCV 80.8   < > 84.5 82.9 81.9 81.0 80.0  PLT 165   < > 136* 179 193 199 263   < > = values in this interval not displayed.    Cardiac Enzymes: No results for input(s): CKTOTAL, CKMB, CKMBINDEX, TROPONINI in the last 168 hours.  BNP: Invalid input(s): POCBNP  CBG: Recent Labs  Lab 09/02/23 1934 09/02/23 2348 09/03/23 0345 09/03/23 0811 09/03/23 0929  GLUCAP 253* 171* 115* 189* 227*    Microbiology: Results for orders placed or performed during the hospital encounter of 08/26/23  Culture, blood (Routine X 2) w Reflex to ID Panel     Status: None   Collection Time: 08/28/23  9:27 AM   Specimen: BLOOD  Result Value Ref Range Status   Specimen Description BLOOD BLOOD RIGHT HAND  Final   Special Requests   Final    BOTTLES DRAWN AEROBIC AND ANAEROBIC Blood Culture adequate volume   Culture   Final    NO GROWTH 5 DAYS Performed at [facility], [address] [place], [place] [address]    Report Status 09/02/2023 FINAL  Final  Culture, blood (Routine X 2) w Reflex to ID Panel     Status: None   Collection Time: 08/28/23  9:32 AM   Specimen: BLOOD  Result Value Ref Range Status   Specimen Description BLOOD BLOOD LEFT HAND  Final   Special Requests   Final    BOTTLES DRAWN AEROBIC AND ANAEROBIC Blood Culture adequate volume   Culture   Final    NO GROWTH 5 DAYS Performed at [facility], [address], [place], [place] [address]    Report Status 09/02/2023 FINAL  Final  Urine Culture     Status: None   Collection Time: 08/28/23 12:41 PM   Specimen: PATH Cytology Urine  Result Value Ref Range Status   Specimen Description   Final    URINE, RANDOM Performed at [facility], [address] [place], [place] [address]    Special Requests   Final     CYTO URINE Performed at [facility], [address] [place], [place] [address]    Culture   Final    NO GROWTH Performed at [facility], [address] [place], [place] [address]    Report Status 08/30/2023 FINAL  Final    Coagulation Studies: No results for input(s): LABPROT, INR in the last 72 hours.   Urinalysis: No results for input(s): COLORURINE, LABSPEC, PHURINE, GLUCOSEU, HGBUR, BILIRUBINUR, KETONESUR, PROTEINUR, UROBILINOGEN, NITRITE, LEUKOCYTESUR in the last 72 hours.  Invalid input(s): APPERANCEUR     Imaging: No results found.    Medications:    sodium chloride  irrigation      Chlorhexidine  Gluconate Cloth  6 each Topical Daily   diltiazem   240 mg Oral Daily   gabapentin   300 mg Oral BID   hydrALAZINE   10 mg Oral Q8H   insulin  aspart  0-15 Units Subcutaneous TID WC   insulin  aspart  0-5 Units Subcutaneous QHS   insulin  aspart  7 Units Subcutaneous TID WC   insulin  glargine-yfgn  30 Units Subcutaneous Daily   isosorbide  mononitrate  30 mg Oral Daily   latanoprost   1 drop Both  Eyes QHS   lubiprostone   24 mcg Oral BID WC   montelukast   10 mg Oral QHS   multivitamin  1 tablet Oral QHS   rosuvastatin   40 mg Oral QHS   senna  2 tablet Oral Daily   acetaminophen , albuterol , ALPRAZolam , hydrALAZINE , labetalol , nitroGLYCERIN , ondansetron  (ZOFRAN ) IV, oxyCODONE   Assessment/ Plan:  68 y.o. male with past medical history of renal cell carcinoma status post left nephrectomy, chronic kidney disease stage IIIb baseline EGFR 38, coronary disease with history of stent placement on Effient , hypertension, lipidemia, diabetes mellitus type 2, anxiety, chronic back pain, Mnire's disease who presented with hematuria and status post cystoscopy, right ureteral stent placement, and retrograde pyelography.  1.  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  The patient's acute kidney injury appears to be secondary to new right-sided hydronephrosis which was due to right renal bleeding.  Patient status post cystoscopy and right retrograde pyelography on 08/28/2023.  Given severe hyperkalemia patient was started on CRRT.  Creatinine continues to improve. Foley catheter removed. Adequate urine output recorded.   2.  Hyperkalemia.   Potassium corrected  3.  Type 2 diabetes with CKD Hemoglobin A1c 7.4% from 11/01/2021 Outpatient regimen included Jardiance, zetia  Agree with d/c metformin .   Glucose elevated.  Primary team to continue management of sliding scale insulin .    LOS: 7 Kenneth Osborn 7/16/202511:21 AM

## 2023-09-03 NOTE — Progress Notes (Signed)
 Pt discharged today at 1700 via private vehicle with wife. AVS/education provided to pt. PIV removed. All belongings sent with pt.

## 2023-09-03 NOTE — Progress Notes (Signed)
 Patient is not able to walk the distance required to go the bathroom, or he/she is unable to safely negotiate stairs required to access the bathroom.  A 3in1 BSC will alleviate this problem

## 2023-09-03 NOTE — TOC Transition Note (Signed)
 Transition of Care [facility]) - Discharge Note   Patient Details  Name: Kenneth Osborn MRN: [id number] Date of Birth: [birth year]  [facility] CM/SW Contact:  Corean ONEIDA Haddock, RN Phone Number: 09/03/2023, 4:14 PM   Clinical Narrative:    Met with patient and wife at bedside today to follow up with patient about home health services.   He request services through [facility].  Referral made and accepted by [facility] with [facility].  Patient request [facility] for discharge.  Referral made to [facility] with [employer] to be delivered to bedside prior to discharge      Barriers to Discharge: Continued Medical Work up   Patient Goals and CMS Choice   CMS Medicare.gov Compare Post Acute Care list provided to:: Patient        Discharge Placement                       Discharge Plan and Services Additional resources added to the After Visit Summary for       Post Acute Care Choice: Home Health                               Social Drivers of Health (SDOH) Interventions SDOH Screenings   Food Insecurity: No Food Insecurity (08/27/2023)  Housing: Low Risk  (08/27/2023)  Transportation Needs: No Transportation Needs (08/27/2023)  Utilities: Not At Risk (08/27/2023)  Depression (PHQ2-9): Low Risk  (06/23/2023)  Financial Resource Strain: Low Risk  (08/21/2022)   Received from [facility]  Tobacco Use: Low Risk  (09/01/2023)     Readmission Risk Interventions    09/01/2023    1:35 PM  Readmission Risk Prevention Plan  Transportation Screening Complete  Medication Review (RN Care Manager) Complete  PCP or Specialist appointment within 3-5 days of discharge Complete  SW Recovery Care/Counseling Consult Complete  Palliative Care Screening Not Applicable  Skilled Nursing Facility Not Applicable

## 2023-09-03 NOTE — Discharge Summary (Addendum)
 Physician Discharge Summary   Patient: Kenneth Osborn MRN: [id number] DOB: December 04, 1955  Admit date:     08/26/2023  Discharge date: 09/03/23  Discharge Physician: Delon Herald   PCP: Valora Agent, MD   Recommendations at discharge:   Start tamsulosin  (Flomax ) once daily Take oxybutynin  (Ditropan ) every 8 hours as needed for stent discomfort Hold chlorthalidone, Jardiance until nephrology follow up visit Follow up with Dr. Valora in 1-2 weeks Follow up with Dr. Douglas; call for appointment Follow up with urology; call for appointment  Discharge Diagnoses: Principal Problem:   Hematuria Active Problems:   Acute kidney injury superimposed on chronic kidney disease (HCC)   CAD (coronary artery disease)   HTN (hypertension)   HLD (hyperlipidemia)   Chronic kidney disease, stage 3b (HCC)   Anxiety   Chronic pain syndrome   Overweight (BMI 25.0-29.9)   Hospital Course: 68yo with h/o L RCC s/p nephrectomy, DM, and CAD who presented on 7/8 with gross hematuria.  CT with hemorrhagic material in R renal collecting system.  Urology consulted and initiated CBI.  Also with AKI on stage 3b CKD, nephrology consulted.  He underwent cystoscopy with R ureteral stent placement.  Postoperatively, he developed worsening renal failure which resolved with BIPAP.  He developed worsening renal failure and was started on CRRT through 7/12.  Voiding trial on 7/15.  Assessment and Plan:  Gross Hematuria Patient with known h/o BPH presented with gross hematuria CT with moderate hydronephrosis with filling defects in the renal pelvis likely secondary to clot 7/10 cystoscopy and right ureteral stent placement Cytology showed atypical urothelial cells, likely reactive CBI initiated and has since been discontinued Foley placed but removed on 7/15 with successful voiding trial Urology consulted Urine culture negative, discontinue ceftriaxone  Feeling well and awaiting dc DC with Tamsulosin  and  oxybutynin , as per urology  AKI superimposed on CKD stage IIIb 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 Douglas Add Renavite   Acute hypoxic respiratory failure Was initially on BiPAP -> weaned to nasal cannula -> room air No further issues Likely associated with acute illness including renal failure   Acute blood loss anemia secondary to gross hematuria Hemoglobin remains stable Resume Effient  at time of dc   Mild ileus Developed abdominal distention KUB with mild ileus and stool burden Now having bowel movements Continue current bowel regimen with Senokot, Amitiza  Resume Nexium   Type 2 diabetes Hemoglobin A1c 7.4%, reasonable but suboptimal control At home on metformin  and Jardiance - held as inpatient, can resume metformin  at dc Continue U500 Hold Jardiance until nephrology f/u   Electrolyte disturbances/Metabolic acidosis Replace electrolytes as needed Acidosis resolved   Acute toxic metabolic encephalopathy Secondary to metabolic acidosis, azotemia, and anesthesia - occurred postoperatively while in the ICU Resolved, back to mental status baseline Cleared for diet by SLP PT/OT consulted, recommending HH PT   Anxiety Continue alprazolam , Valium  as needed  Chronic back pain I have reviewed this patient in the [place] Controlled Substances Reporting System.  He is receiving medications from multiple providers but appears to be taking them as prescribed. He is at high risk of opioid misuse, diversion, or overdose.  Resume home oxycodone , Flexeril    CAD Resume Effient , Imdur    Hypertension Continue diltiazem , Toprol  XL Continue to hold chlorthalidone   Hyperlipidemia  Continue rosuvastatin ,  Zetia , Niaspan    History of esophageal dysfunction Follows with GI outpatient   Class 1 Obesity  Body mass index is 32.46 kg/m.SABRA  Weight loss should be encouraged Outpatient PCP/bariatric medicine f/u encouraged Significantly low or high BMI is associated with higher medical risk including morbidity and mortality      Pain control - [place]  Controlled Substance Reporting System database was reviewed. and patient was instructed, not to drive, operate heavy machinery, perform activities at heights, swimming or participation in water  activities or provide baby-sitting services while on Pain, Sleep and Anxiety Medications; until their outpatient Physician has advised to do so again. Also recommended to not to take more than prescribed Pain, Sleep and Anxiety Medications.    Disposition: Home Diet recommendation:  Cardiac and Carb modified diet DISCHARGE MEDICATION: Allergies as of 09/03/2023   No Known Allergies      Medication List     PAUSE taking these medications    chlorthalidone 25 MG tablet Wait to take this until your doctor or other care provider tells you to start again. Commonly known as: HYGROTON Take 25 mg by mouth daily.       TAKE these medications    acetaminophen  325 MG tablet Commonly known as: TYLENOL  Take 1 tablet (325 mg total) by mouth every 6 (six) hours as needed for mild pain (pain score 1-3), fever or headache.   ALPRAZolam  0.5 MG tablet Commonly known as: XANAX  Take 1 tablet (0.5 mg total) by mouth at bedtime. What changed: additional instructions   BAQSIMI ONE PACK NA Place 1 Dose into the nose daily as needed (severely low blood sugar).   cetirizine 10 MG tablet Commonly known as: ZYRTEC Take 10 mg by mouth daily.   cyclobenzaprine  10 MG tablet Commonly known as: FLEXERIL  Take 1 tablet (10 mg total) by mouth 3 (three) times daily as needed for muscle spasms.   diazepam  5 MG tablet Commonly known as: VALIUM  Take 5 mg by  mouth 3 (three) times daily as needed.   diltiazem  240 MG 24 hr capsule Commonly known as: CARDIZEM  CD Take 240 mg by mouth daily.   esomeprazole 40 MG capsule Commonly known as: NEXIUM Take 40 mg by mouth 2 (two) times daily.   ezetimibe  10 MG tablet Commonly known as: ZETIA  Take 10 mg by mouth daily.   gabapentin  800 MG tablet Commonly known as: NEURONTIN  Take 800 mg by mouth 2 (two) times daily.   HumuLIN  R U-500 KwikPen 500 UNIT/ML KwikPen Generic drug: insulin  regular human CONCENTRATED Inject 65-75 Units into the skin See admin instructions. Sliding scale Can adjust according to blood sugar Home med   isosorbide  mononitrate 30 MG 24 hr tablet Commonly known as: IMDUR  Take 30 mg by mouth daily.   Jardiance 25 MG Tabs tablet Generic drug: empagliflozin Take 25 mg by mouth daily.   latanoprost  0.005 % ophthalmic solution Commonly known as: XALATAN  Place 1 drop into both eyes at bedtime.   lubiprostone  24 MCG capsule Commonly known as: AMITIZA  Take 24 mcg by mouth 2 (two) times daily with a meal.   Magnesium  Oxide 250 MG Tabs Take 500 mg by mouth daily.   meclizine  25 MG tablet Commonly known as: ANTIVERT  Take 25 mg by mouth See admin instructions. Take 25 mg in the morning, and additional 25 mg if needed up to a total of three per day for Meniere's disease   metFORMIN  1000 MG tablet Commonly known as: GLUCOPHAGE  Take 1,000 mg by mouth  2 (two) times daily.   metoprolol  succinate 100 MG 24 hr tablet Commonly known as: TOPROL -XL Take 100 mg by mouth 2 (two) times daily. Take with or immediately following a meal.   montelukast  10 MG tablet Commonly known as: SINGULAIR  Take 10 mg by mouth at bedtime.   multivitamin Tabs tablet Take 1 tablet by mouth at bedtime.   naloxone  4 MG/0.1ML Liqd nasal spray kit Commonly known as: NARCAN  Place 1 spray into the nose as needed for up to 365 doses (for opioid-induced respiratory depresssion). In case of emergency  (overdose), spray once into each nostril. If no response within 3 minutes, repeat application and call 911.   niacin  500 MG ER tablet Commonly known as: VITAMIN B3 Take 500 mg by mouth at bedtime.   nitroGLYCERIN  0.4 MG SL tablet Commonly known as: NITROSTAT  Place 0.4 mg under the tongue as needed.   oxybutynin  5 MG tablet Commonly known as: DITROPAN  Take 1 tablet (5 mg total) by mouth every 8 (eight) hours as needed for bladder spasms.   oxyCODONE  5 MG immediate release tablet Commonly known as: Oxy IR/ROXICODONE  Take 1 tablet (5 mg total) by mouth every 8 (eight) hours. Must last 30 days.   oxyCODONE  5 MG immediate release tablet Commonly known as: Oxy IR/ROXICODONE  Take 1 tablet (5 mg total) by mouth every 8 (eight) hours. Must last 30 days.   oxyCODONE  5 MG immediate release tablet Commonly known as: Oxy IR/ROXICODONE  Take 1 tablet (5 mg total) by mouth every 8 (eight) hours. Must last 30 days. Start taking on: September 16, 2023   prasugrel  10 MG Tabs tablet Commonly known as: EFFIENT  Take 10 mg by mouth daily.   rosuvastatin  40 MG tablet Commonly known as: CRESTOR  Take 40 mg by mouth at bedtime.   senna-docusate 8.6-50 MG tablet Commonly known as: Senokot-S Take 1 tablet by mouth 2 (two) times daily.   tamsulosin  0.4 MG Caps capsule Commonly known as: FLOMAX  Take 1 capsule (0.4 mg total) by mouth daily after supper.               Durable Medical Equipment  (From admission, onward)           Start     Ordered   09/03/23 1218  For home use only DME Bedside commode  Once       Question:  Patient needs a bedside commode to treat with the following condition  Answer:  Weakness   09/03/23 1217            Discharge Exam:   Subjective: Feeling much better and wants to go home.  Passed his voiding trial.     Objective: Vitals:   09/03/23 0813 09/03/23 1311  BP: (!) 158/86 (!) 152/84  Pulse: 90 86  Resp: 20 19  Temp:    SpO2: 93%      Intake/Output Summary (Last 24 hours) at 09/03/2023 1629 Last data filed at 09/03/2023 1528 Gross per 24 hour  Intake 720 ml  Output 1000 ml  Net -280 ml   Filed Weights   08/31/23 0142 09/01/23 0401 09/03/23 0500  Weight: 96.9 kg 95.2 kg 99.7 kg    Exam:  General:  Appears calm and comfortable and is in NAD, up in bedside chair Eyes:  normal lids, iris ENT:  grossly normal hearing, lips & tongue, mmm Cardiovascular:  RRR. 1+ LE edema.  Respiratory:   CTA bilaterally with no wheezes/rales/rhonchi.  Normal respiratory effort. Abdomen:  soft, NT, ND Back:  normal alignment, no CVAT Skin:  no rash or induration seen on limited exam Musculoskeletal:  grossly normal tone BUE/BLE, good ROM, no bony abnormality Psychiatric:  grossly normal mood and affect, speech fluent and appropriate, AOx3 Neurologic:  CN 2-12 grossly intact, moves all extremities in coordinated fashion  Data Reviewed: I have reviewed the patient's lab results since admission.  Pertinent labs for today include:   Glucose 108 BUN 54/Creatinine 2.45/GFR 28, improving WBC 9.1 Hgb 12.2, improved    Condition at discharge: improving  The results of significant diagnostics from this hospitalization (including imaging, microbiology, ancillary and laboratory) are listed below for reference.   Imaging Studies: DG Chest Port 1 View Result Date: 08/30/2023 CLINICAL DATA:  Pneumonia.  Choking. EXAM: PORTABLE CHEST 1 VIEW COMPARISON:  Chest x-ray 08/28/2023 FINDINGS: Which right-sided central venous catheter tip projects over the distal SVC. The heart size and mediastinal contours are within normal limits. Both lungs are clear. Cervical spinal fusion plate is present. No acute osseous abnormality. IMPRESSION: No active disease. Electronically Signed   By: Greig Pique M.D.   On: 08/30/2023 18:47   ECHOCARDIOGRAM COMPLETE Result Date: 08/29/2023    ECHOCARDIOGRAM REPORT   Patient Name:   LENG MONTESDEOCA Date of Exam:  08/29/2023 Medical Rec #:  [id number]       Height:       69.0 in Accession #:    [id number]      Weight:       206.6 lb Date of Birth:  04-Jul-1955       BSA:          2.095 m Patient Age:    67 years        BP:           118/95 mmHg Patient Gender: M               HR:           94 bpm. Exam Location:  [facility] Procedure: 2D Echo, Cardiac Doppler and Color Doppler (Both Spectral and Color            Flow Doppler were utilized during procedure). Indications:     Abnormal ECG R94.31  History:         Patient has no prior history of Echocardiogram examinations.                  Risk Factors:Diabetes and Hypertension. Anginal pain.  Sonographer:     Christopher Furnace Referring Phys:  [id number] LONELL KANDICE MOOSE Diagnosing Phys: Redell Cave MD  Sonographer Comments: Technically challenging study due to limited acoustic windows, no apical window and no subcostal window. IMPRESSIONS  1. Left ventricular ejection fraction, by estimation, is 60 to 65%. The left ventricle has normal function. The left ventricle has no regional wall motion abnormalities. There is mild left ventricular hypertrophy. Left ventricular diastolic parameters are indeterminate.  2. Right ventricular systolic function is normal. The right ventricular size is not well visualized.  3. The mitral valve is degenerative. No evidence of mitral valve regurgitation.  4. The aortic valve is calcified. Aortic valve regurgitation is not visualized. Aortic valve sclerosis/calcification is present, without any evidence of aortic stenosis. FINDINGS  Left Ventricle: Left ventricular ejection fraction, by estimation, is 60 to 65%. The left ventricle has normal function. The left ventricle has no regional wall motion abnormalities. The left ventricular internal cavity size was normal in size. There is  mild left ventricular hypertrophy. Left ventricular diastolic parameters are  indeterminate. Right Ventricle: The right ventricular size is not well visualized. No increase in right  ventricular wall thickness. Right ventricular systolic function is normal. Left Atrium: Left atrial size was normal in size. Right Atrium: Right atrial size was not well visualized. Pericardium: There is no evidence of pericardial effusion. Mitral Valve: The mitral valve is degenerative in appearance. There is mild calcification of the mitral valve leaflet(s). No evidence of mitral valve regurgitation. Tricuspid Valve: The tricuspid valve is normal in structure. Tricuspid valve regurgitation is mild. Aortic Valve: The aortic valve is calcified. Aortic valve regurgitation is not visualized. Aortic valve sclerosis/calcification is present, without any evidence of aortic stenosis. Pulmonic Valve: The pulmonic valve was not well visualized. Pulmonic valve regurgitation is not visualized. Aorta: The aortic root is normal in size and structure. IAS/Shunts: No atrial level shunt detected by color flow Doppler.  LEFT VENTRICLE PLAX 2D LVIDd:         4.10 cm LVIDs:         2.30 cm LV PW:         0.90 cm LV IVS:        1.60 cm  LEFT ATRIUM         Index LA diam:    3.80 cm 1.81 cm/m   AORTA Ao Root diam: 3.20 cm Redell Cave MD Electronically signed by Redell Cave MD Signature Date/Time: 08/29/2023/12:42:06 PM    Final    DG Chest Port 1 View Result Date: 08/28/2023 CLINICAL DATA:  Central line placement EXAM: PORTABLE CHEST 1 VIEW COMPARISON:  08/28/2023, 12/11/2020, CT 07/23/2023 FINDINGS: Hypoventilatory changes. New right IJ central venous catheter tip at the right atrium. Cardiomegaly with vascular congestion and diffuse interstitial and hazy pulmonary opacity, probably due to edema. No pneumothorax. Hardware in the cervical spine IMPRESSION: 1. New right IJ central venous catheter tip at the right atrium. No pneumothorax. 2. Cardiomegaly with vascular congestion and diffuse interstitial and hazy pulmonary opacity, probably due to edema. Electronically Signed   By: Luke Bun M.D.   On: 08/28/2023 22:00    DG Abd 1 View Result Date: 08/28/2023 CLINICAL DATA:  Abdominal distension EXAM: ABDOMEN - 1 VIEW COMPARISON:  02/09/2020 FINDINGS: Mild gaseous distension of the colon. No evidence of bowel obstruction. Moderate stool burden. No organomegaly or free air. Right ureteral stent in place. IMPRESSION: Mild gaseous distention of the colon likely reflects mild ileus. Moderate stool burden. Electronically Signed   By: Franky Crease M.D.   On: 08/28/2023 19:09   DG Chest Port 1 View Result Date: 08/28/2023 CLINICAL DATA:  Dyspnea. EXAM: PORTABLE CHEST 1 VIEW COMPARISON:  December 11, 2020. FINDINGS: Stable cardiomediastinal silhouette. Left lung is clear. Minimal right basilar subsegmental atelectasis is noted. Bony thorax is unremarkable. IMPRESSION: Minimal right basilar subsegmental atelectasis. Electronically Signed   By: Lynwood Landy Raddle M.D.   On: 08/28/2023 16:32   DG OR UROLOGY CYSTO IMAGE ([facility] ONLY) Result Date: 08/28/2023 There is no interpretation for this exam.  This order is for images obtained during a surgical procedure.  Please See Surgeries Tab for more information regarding the procedure.   US  PELVIS LIMITED (TRANSABDOMINAL ONLY) Result Date: 08/27/2023 CLINICAL DATA:  Gross hematuria EXAM: LIMITED ULTRASOUND OF PELVIS TECHNIQUE: Limited transabdominal ultrasound examination of the pelvis was performed. COMPARISON:  CT 08/26/2023 FINDINGS: Urinary bladder is decompressed by Foley catheter. Pre-void volume: None significant Other findings:  None. IMPRESSION: Urinary bladder is completely decompressed by Foley catheter. Electronically Signed   By: Luke  Scott M.D.   On: 08/27/2023 19:22   CT ABDOMEN PELVIS W CONTRAST Result Date: 08/26/2023 CLINICAL DATA:  Fredericka lower abdominal pain history of nephrectomy EXAM: CT ABDOMEN AND PELVIS WITH CONTRAST TECHNIQUE: Multidetector CT imaging of the abdomen and pelvis was performed using the standard protocol following bolus administration of  intravenous contrast. RADIATION DOSE REDUCTION: This exam was performed according to the departmental dose-optimization program which includes automated exposure control, adjustment of the mA and/or kV according to patient size and/or use of iterative reconstruction technique. CONTRAST:  75mL OMNIPAQUE  IOHEXOL  300 MG/ML  SOLN COMPARISON:  CT 11/10/2020, 07/23/2023, 07/23/2022 FINDINGS: Lower chest: Mild scarring at the left lung base. No acute airspace disease. Hepatobiliary: Hepatic steatosis. Cholecystectomy. No biliary dilatation Pancreas: Unremarkable. No pancreatic ductal dilatation or surrounding inflammatory changes. Spleen: Normal in size without focal abnormality. Adrenals/Urinary Tract: Normal right adrenal gland. Nonvisualized left adrenal gland. Status post left nephrectomy. New mild right hydronephrosis. Hyperdense material in the right renal collecting system. No definite calcified stone along the course of the ureter. No significant excretion of contrast on delayed images. Vague hyperdense masslike area in the posterior bladder, series 2, image 80. Stomach/Bowel: Stomach nonenlarged. No dilated small bowel. No acute bowel wall thickening. Vascular/Lymphatic: Aortic atherosclerosis. No enlarged abdominal or pelvic lymph nodes. Reproductive: Negative prostate Other: Negative for pelvic effusion or free air. Subcutaneous infiltration within the anterior abdominal wall as before. Musculoskeletal: No acute or suspicious osseous abnormality IMPRESSION: 1. Status post left nephrectomy. New mild right hydronephrosis. Hyperdense material in the right renal collecting system, suspect for hemorrhagic material. No definite calcified stone along the course of the ureter. Vague hyperdense masslike area in the posterior bladder, probably represents hemorrhagic material/hematoma, short-term follow-up ultrasound could be obtained to ensure resolution. Poor excretion of contrast from right kidney on delayed views  suggesting decreased renal function. 2. Hepatic steatosis. 3. Aortic atherosclerosis. Aortic Atherosclerosis (ICD10-I70.0). Electronically Signed   By: Luke Scott M.D.   On: 08/26/2023 20:51    Microbiology: Results for orders placed or performed during the hospital encounter of 08/26/23  Culture, blood (Routine X 2) w Reflex to ID Panel     Status: None   Collection Time: 08/28/23  9:27 AM   Specimen: BLOOD  Result Value Ref Range Status   Specimen Description BLOOD BLOOD RIGHT HAND  Final   Special Requests   Final    BOTTLES DRAWN AEROBIC AND ANAEROBIC Blood Culture adequate volume   Culture   Final    NO GROWTH 5 DAYS Performed at [facility], [address] [place], [place] [address]    Report Status 09/02/2023 FINAL  Final  Culture, blood (Routine X 2) w Reflex to ID Panel     Status: None   Collection Time: 08/28/23  9:32 AM   Specimen: BLOOD  Result Value Ref Range Status   Specimen Description BLOOD BLOOD LEFT HAND  Final   Special Requests   Final    BOTTLES DRAWN AEROBIC AND ANAEROBIC Blood Culture adequate volume   Culture   Final    NO GROWTH 5 DAYS Performed at [facility], [address] [place], [place] [address]    Report Status 09/02/2023 FINAL  Final  Urine Culture     Status: None   Collection Time: 08/28/23 12:41 PM   Specimen: PATH Cytology Urine  Result Value Ref Range Status   Specimen Description   Final    URINE, RANDOM Performed at [facility], [address]  [place], [place] [address]    Special Requests   Final     CYTO URINE Performed at [facility], [address] [place], [place] [address]    Culture   Final    NO GROWTH Performed at [facility], [address] [place], [place] [address]    Report Status 08/30/2023 FINAL  Final    Labs: CBC: Recent Labs  Lab 08/28/23 1546 08/28/23 1950 08/30/23 0329 08/31/23 0350 09/01/23 0412 09/02/23 0330 09/03/23 0404  WBC 13.4*   <  > 7.4 6.0 6.3 7.1 9.1  NEUTROABS 11.5*  --   --   --  3.8 3.9 4.7  HGB 12.9*   < > 10.6* 11.2* 11.3* 11.5* 12.2*  HCT 39.9   < > 33.3* 35.4* 35.4* 34.6* 36.5*  MCV 80.8   < > 84.5 82.9 81.9 81.0 80.0  PLT 165   < > 136* 179 193 199 263   < > = values in this interval not displayed.   Basic Metabolic Panel: Recent Labs  Lab 08/29/23 0422 08/29/23 1534 08/30/23 0329 08/31/23 0350 09/01/23 0412 09/02/23 0330 09/03/23 0404  NA 137 137 138 143 139  139 142 144  K 4.3 4.7 4.3 3.8 3.9  3.9 3.9 3.6  CL 101 101 104 106 103  104 105 108  CO2 21* 26 28 27 27  26 26 27   GLUCOSE 164* 272* 305* 159* 299*  298* 224* 108*  BUN 45* 42* 32* 48* 57*  57* 59* 54*  CREATININE 5.76* 4.40* 2.89* 3.92* 3.39*  3.53* 3.02* 2.45*  CALCIUM  8.9 8.6* 8.4* 9.4 9.5  9.5 9.5 9.8  MG 1.8  --  2.4  --  2.2 1.9 1.8  PHOS 4.7* 4.1  --   --  3.8  3.9 4.1 4.0   Liver Function Tests: Recent Labs  Lab 08/28/23 1546 08/29/23 0422 08/29/23 1534 09/01/23 0412 09/02/23 0330 09/03/23 0404  AST 16  --   --  18 17 31   ALT 30  --   --  23 22 30   ALKPHOS 77  --   --  82 81 85  BILITOT 0.6  --   --  0.4 0.6 0.4  PROT 6.5  --   --  6.5 5.9* 6.2*  ALBUMIN  3.0* 2.9* 2.9* 2.8*  2.9* 2.8* 2.9*   CBG: Recent Labs  Lab 09/02/23 2348 09/03/23 0345 09/03/23 0811 09/03/23 0929 09/03/23 1155  GLUCAP 171* 115* 189* 227* 361*    Discharge time spent: greater than 30 minutes.  Signed: Delon Herald, MD [facility] 09/03/2023

## 2023-09-03 NOTE — Plan of Care (Signed)

## 2023-09-03 NOTE — Hospital Course (Signed)
 67yo with h/o L RCC s/p nephrectomy, DM, and CAD who presented on 7/8 with gross hematuria.  CT with hemorrhagic material in R renal collecting system.  Urology consulted and initiated CBI.  Also with AKI on stage 3b CKD, nephrology consulted.  He underwent cystoscopy with R ureteral stent placement.  Postoperatively, he developed worsening renal failure which resolved with BIPAP.  He developed worsening renal failure and was started on CRRT through 7/12.  Voiding trial on 7/15.

## 2023-09-03 NOTE — Progress Notes (Signed)
 Mobility Specialist - Progress Note   09/03/23 1404  Mobility  Activity Transferred to/from [facility];Ambulated with assistance in room  Level of Assistance Standby assist, set-up cues, supervision of patient - no hands on  Assistive Device None  Distance Ambulated (ft) 8 ft  Activity Response Tolerated well  Mobility visit 1 Mobility  Mobility Specialist Start Time (ACUTE ONLY) 1138  Mobility Specialist Stop Time (ACUTE ONLY) 1200  Mobility Specialist Time Calculation (min) (ACUTE ONLY) 22 min   MS responding to chair alarm, Pt transferring to the [facility] upon arrival---- BM on the recliner and floor. Pt doff/dons gown, STS and remains standing for ~2 mins while MS completes peri care MaxA. Pt amb to bed, left supine with needs within reach. RN present at bedside.  America Silvan Mobility Specialist 09/03/23 2:08 PM

## 2023-09-03 NOTE — Inpatient Diabetes Management (Signed)
 Inpatient Diabetes Program Recommendations  AACE/ADA: New Consensus Statement on Inpatient Glycemic Control   Target Ranges:  Prepandial:   less than 140 mg/dL      Peak postprandial:   less than 180 mg/dL (1-2 hours)      Critically ill patients:  140 - 180 mg/dL    Latest Reference Range & Units 09/02/23 08:12 09/02/23 12:23 09/02/23 17:56 09/02/23 19:34 09/02/23 23:48 09/03/23 03:45  Glucose-Capillary 70 - 99 mg/dL 776 (H) 689 (H) 728 (H) 253 (H) 171 (H) 115 (H)   Review of Glycemic Control  Diabetes history: DM2 Outpatient Diabetes medications: Humulin  R U500 65-75 units TID, Jardiance 25 mg daily, Metformin  1000 mg BID Current orders for Inpatient glycemic control: Semglee  30 units daily, Novolog  7 units TID with meals, Novolog  0-15 units Q4H   Inpatient Diabetes Program Recommendations:     Insulin : Please consider changing CBGs to AC&HS and Novolog  0-15 units to AC&HS.  Thanks, Earnie Gainer, RN, MSN, CDCES Diabetes Coordinator Inpatient Diabetes Program [phone] (Team Pager from 8am to 5pm)

## 2023-09-08 ENCOUNTER — Telehealth: Payer: Self-pay | Admitting: Pain Medicine

## 2023-09-08 NOTE — Telephone Encounter (Signed)
 Explained that our office cannot authorize taking additional opioid medication other that what is prescribed. Informed him that his urologist may prescribe additional medication if needed. Offered to fax the letter to urologist explaining that it is not a violation of pain contract to prescribe additional opioids for an acute pain situation. He said he has appt later this week and will discuss with urologist at that time.

## 2023-09-08 NOTE — Telephone Encounter (Signed)
 PT states that he has been in the hospital for a total of 8 hours. 6 days out of the 8 he was in ICU. PT wants to know if know if he can up take extra to deal with his pain. PT will like to take up to 20mg  of Oxycodone . PT states that he will only take for extra for a about a week. Please give patient a call. TY

## 2023-09-30 NOTE — Progress Notes (Signed)
 [facility] Follow Up Visit   Patient Name: Kenneth Osborn, male   Patient DOB: 05-31-1955 Date of Service: 09/30/2023  Patient MRN: [id number] Provider Creating Note: Woodward Brought, MD  [phone] Primary Care Physician: Valora Lynwood FALCON, MD   [address] [place] [address] Additional Physicians/ Providers:   Impression/Recommendations   Kenneth Osborn is a 68 y.o. white male with anxiety, hypertension, coronary artery disease, carotid artery stenosis, diabetes mellitus type II, diabetic neuropathy, GERD, meniere's, sleep apnea, tremor, chronic pain, history of renal cell carcinoma who presents for follow up for chronic kidney disease stage IIIB with hyperkalemia:   Chronic kidney disease stage IIIB with hyperkalemia and proteinuria. With recent acute kidney injury from obstructive uropathy. Now with stent.  - Continue empagliflozin 25mg  daily  - avoid ACE-I/ARB due to hyperkalemia.  - Avoid mineralocorticoid receptor antagonists due to hyperkalemia.  - currently holding diuretics for kaliuresis - low potassium diet reviewed.   - refer to urology for follow up  Hypertension with chronic kidney disease: 113/73.  - Current regimen of diltiazem , isosorbide  mononitrate, metoprolol  and tamsulosin .  - home blood pressure monitoring.   Secondary Hyperparathyroidism: with history of hypercalcemia.  - not on a vitamin D agent.   Hyperlipidemia - rosuvastatin   Diabetes mellitus type II with chronic kidney disease: insulin  dependent. On metformin . Hemoglobin A1c of 7% on 06/19/23. followed by endocrinology, Dr. Cherilyn - Continue empagliflozin 25mg  daily    Patient Active Problem List  Diagnosis  . Hyperkalemia  . Type 2 diabetes mellitus with diabetic chronic kidney disease (HCC)  . Renal cell carcinoma <Left side> (HCC)  . Anemia in chronic kidney disease  . Chronic kidney disease stage 3B (HCC)  . Secondary hyperparathyroidism of renal origin (HCC)   . Hypercalcemia  . Hypotension  . Proteinuria  . Hypertensive chronic kidney disease, malignant, with chronic kidney disease stage I through stage IV, or unspecified  . Urinary tract obstruction  . Acute kidney failure (HCC)    Orders Placed This Encounter  . PTH, Intact  . Renal Function Panel  . CBC and Differential  . Urinalysis, Complete w/reflex to Culture  . Protein, Total, Random Urine w/Creatinine (Protein/Creat Ratio)  . Ambulatory referral to Urology       Return in about 4 weeks (around 10/28/2023).  Chief Complaint   Chief Complaint  Patient presents with  . Follow-up    History of Present Illness  Kenneth Osborn presents for follow up for chronic kidney disease stage IIIB with hyperkalemia. Patient presents with his wife.   Patient was hospitalized from 7/8 to 7/16 for obstructive uropathy. His acute kidney injury required emergent hemodialysis treatment. Patient underwent cystoscopy with right ureteral stent placement.   Follow up creatinine of 2.1 with GFR of 34.   Patient currently off his chlorthalidone. He states his blood pressure is well controlled.   Patient is having some urinary incontinence.   Patient complains of dysphagia. He states he was told his symptoms were IBS.   Denies any more gross hematuria.   Denies use of nonsteroidal anti-inflammatory agent.   Medications   Current Outpatient Medications:  .  latanoprost  (XALATAN ) 0.005 % ophthalmic solution, 1 drop every night, Disp: , Rfl:  .  Multiple Vitamin (multivitamin) tablet, Take 1 tablet by mouth 1 (one) time each day, Disp: , Rfl:  .  ALPRAZolam  (XANAX ) 0.5 MG tablet, Take 1 tablet (0.5 mg total) by mouth at bedtime as needed for Sleep, Disp: ,  Rfl:  .  cetirizine (ZyrTEC) 10 MG tablet, Take 1 tablet (10 mg total) by mouth once daily, Disp: , Rfl:  .  cyclobenzaprine  (FLEXERIL ) 10 MG tablet, Take 1 tablet (10 mg total) by mouth 3 (three) times daily as needed, Disp: , Rfl:  .   diazePAM  (VALIUM ) 5 MG tablet, Take 1 tablet (5 mg total) by mouth every 8 (eight) hours as needed (Menieres), Disp: , Rfl:  .  dilTIAZem  (TIAZAC ) 240 MG 24 hr capsule, TAKE 1 CAPSULE BY MOUTH  ONCE DAILY, Disp: , Rfl:  .  Docusate Sodium  (DSS) 100 MG capsule, Take 100 mg by mouth 2 (two) times daily, Disp: , Rfl:  .  Empagliflozin (Jardiance) 25 MG tablet, Take 25 mg by mouth 1 (one) time each day in the morning, Disp: 30 tablet, Rfl: 11 .  esomeprazole (NexIUM) 40 MG DR capsule, Take 1 capsule (40 mg total) by mouth 2 (two) times daily, Disp: , Rfl:  .  ezetimibe  (ZETIA ) 10 MG tablet, TAKE 1 TABLET BY MOUTH ONCE DAILY, Disp: , Rfl:  .  gabapentin  (NEURONTIN ) 800 MG tablet, Take 800 mg by mouth in the morning and 800 mg in the evening., Disp: , Rfl:  .  insulin  regular (HumuLIN  R) 500 UNIT/ML CONCENTRATED injection, Patient taking differently: 90 UNITS WITH BREAKFAST.  Up to 300 units on sliding scale if needed, Disp: , Rfl:  .  isosorbide  mononitrate (IMDUR ) 30 MG 24 hr tablet, Take 1 tablet (30 mg total) by mouth in the morning. Do not crush or chew.., Disp: 30 tablet, Rfl: 11 .  lubiprostone  (AMITIZA ) 24 MCG capsule, Take 24 mcg by mouth in the morning and 24 mcg in the evening. Take with meals., Disp: , Rfl:  .  Magnesium  Oxide 500 MG tablet, Take 500 mg by mouth once daily, Disp: , Rfl:  .  meclizine  (ANTIVERT ) 25 MG tablet, Take 1 tablet (25 mg total) by mouth 3 (three) times daily as needed for Dizziness, Disp: , Rfl:  .  metFORMIN  (GLUCOPHAGE ) 1000 MG tablet, TAKE 1 TABLET BY MOUTH  TWICE DAILY WITH MEALS, Disp: , Rfl:  .  metoprolol  succinate XL (TOPROL -XL) 100 MG 24 hr tablet, TAKE 1 TABLET BY MOUTH  TWICE DAILY, Disp: , Rfl:  .  montelukast  (SINGULAIR ) 10 MG tablet, Take 1 tablet (10 mg total) by mouth at bedtime, Disp: , Rfl:  .  niacin  (NIASPAN ) 500 MG CR tablet, Take 1 tablet (500 mg total) by mouth at bedtime, Disp: , Rfl:  .  nitroglycerin  (NITROSTAT ) 0.4 MG SL tablet, DISSOLVE 1  TABLET UNDER THE TONGUE EVERY 5 MINUTES AS  NEEDED FOR CHEST PAIN. MAX  OF 3 TABLETS IN 15 MINUTES. CALL 911 IF PAIN PERSISTS., Disp: , Rfl:  .  OMEGA-3 FATTY ACIDS PO, Take 1,200 mg by mouth 2 (two) times daily., Disp: , Rfl:  .  oxyCODONE  (OXY-IR) 5 MG immediate release capsule, every 6 (six) hours if needed for moderate pain or severe pain, Disp: , Rfl:  .  prasugrel  (EFFIENT ) 10 MG tablet, TAKE 1 TABLET BY MOUTH ONCE DAILY, Disp: , Rfl:  .  rosuvastatin  (CRESTOR ) 40 MG tablet, TAKE 1 TABLET BY MOUTH AT  NIGHT, Disp: , Rfl:    Allergies Patient has no known allergies.  History Past Medical History:  Diagnosis Date  . Acute kidney failure (HCC) 09/30/2023  . Anxiety   . Barrett's esophagus   . Carotid artery stenosis   . Chronic kidney disease stage 3B (HCC) 12/25/2020  .  Coronary atherosclerosis of unspecified type of vessel, native or graft   . Diabetic neuropathy with neurologic complication (HCC)   . Gastroesophageal reflux disease   . Hypercalcemia 04/12/2021  . Hyperkalemia   . Hyperlipidemia   . Hypertensive chronic kidney disease, malignant, with chronic kidney disease stage I through stage IV, or unspecified 06/04/2022  . Lung nodule   . Minimal cognitive impairment   . Mnire's disease   . Proteinuria 06/04/2022  . Renal cell carcinoma <Left side> (HCC)   . Seasonal allergic rhinitis   . Secondary hyperparathyroidism of renal origin (HCC) 02/07/2021  . Sleep apnea   . Tremor   . Type 2 diabetes mellitus with diabetic chronic kidney disease (HCC)   . Urinary tract obstruction 09/30/2023    Past Surgical History:  Procedure Laterality Date  . APPENDECTOMY    . BACK SURGERY    . CHOLECYSTECTOMY    . COLONOSCOPY     12/22/1998,03/31/2003,06/06/2008,09/19/2014  . CORONARY ANGIOPLASTY    . ESOPHAGOGASTRODUODENOSCOPY     01/26/1999,06/06/2008,06/04/2010  . MASTOID SURGERY Left   . NEPHRECTOMY Left 04/20/2020   Dr. Renda  . SPINAL CORD STIMULATOR IMPLANT     Family  History  Problem Relation Age of Onset  . Hypertension Mother   . Diabetes Mother   . Cancer Mother        Breast  . Heart disease Father   . Hypertension Father   . Hypertension Sister   . Cancer Sister        Breast  . Hypertension Brother   . Diabetes Maternal Grandmother   . Diabetes Paternal Grandmother   . Kidney disease Paternal Grandfather   . Hypertension Paternal Grandfather   . Diabetes Paternal Grandfather    Social History   Tobacco Use  . Smoking status: Never  . Smokeless tobacco: Never  Substance Use Topics  . Alcohol use: Never     Physical Exam  Vitals BP 107/72 (BP Location: Right upper arm, Patient Position: Standing)   Pulse 82   Temp 98.3 F   Wt 186 lb 3.2 oz (84.5 kg)   SpO2 100%   BMI 27.50 kg/m   Constitutional: He is oriented to person, place, and time.  HEENT:  Right Ear: Hearing normal.  Left Ear: Hearing normal.  Nose: Nose normal. Mouth/Throat: Oropharynx is clear and moist.  Eyes: Conjunctivae and EOM are normal. Pupils are equal, round, and reactive to light.  Cardiovascular:  Normal rate, regular rhythm and intact distal pulses.           Murmur heard. Pulmonary/Chest: Effort normal and breath sounds normal.  Abdominal: Soft. Bowel sounds are normal.  Neurological: He is alert and oriented to person, place, and time.  Skin: Skin is warm and dry.  Psychiatric: He has a normal mood and affect. His behavior is normal. Judgment normal.   Urine microscopy: TNTC red cells. Red on appearance.    Laboratory Studies  Chemistry  Lab Units 09/11/23 1014 08/26/23 1350 01/22/23 1418 12/24/22 1350 11/13/22 1056 08/14/22 0823 06/20/22 0850 05/01/22 1423 03/04/22 1107 01/16/22 1154 10/31/21 1117 10/31/21 1117  SODIUM mmol/L 142 142 140 136 142 139 140 138 140 139    < > 137  POTASSIUM mmol/L 4.5 5.6* 4.5 6.3* 4.9 4.9 4.8 5.7* 5.5* 5.7*   < > 7.0*  CHLORIDE mmol/L 106 105 101 101 105 106 105 103 107 105    < > 107  CO2 mmol/L 25.0  24 27 23.6 24 25.8 26 26.2 25 26.3   < >  21  CALCIUM  mg/dL 9.6 9.9 89.7 10 89.1* 9.5 9.6 9.9 10.3 9.9   < > 9.6  PHOSPHORUS mg/dL 3.1  --   --   --  3.7  --  4.0 2.8 3.4  --   --  3.6  ALK PHOS U/L  --  92 105* 92  --  81  --   --   --  83  --   --   PTH pg/mL  --  160*  --   --  63  --   --   --  118*  --   --  205*  GLUCOSE mg/dL 747* 683* 806* 669* 666* 216* 149* 255* 189* 81   < > 136  ALBUMIN  g/dL 4.0 4.3 4.5 4.5 4.4 4.3 4.1 4.6 4.4 4.5   < > 4.3  BUN mg/dL 35* 26* 34* 55* 46* 42* 35* 34* 32* 31*   < > 40*  CREATININE mg/dL 2.1* 8.11* 2* 2.9* 8.06* 2.0* 1.95* 1.8* 1.80* 1.8*   < > 2.93*   < > = values in this interval not displayed.        No lab exists for component: IRON SATURATION, TRANSSATPER  CBC  Lab Units 08/26/23 1350 11/25/22 1021 11/13/22 1056 03/04/22 1107 10/31/21 1117  WBC AUTO Thousand/uL 8.8  --  8.0  --  9.3  HEMOGLOBIN g/dL 84.5  --  84.6  --  86.5  HEMOGLOBIN URINE   --  NEGATIVE  --  NEGATIVE  --   HEMATOCRIT % 48.5  --  48.2  --  41.1  MCV fL 83.9  --  88.9  --  85.3  PLATELETS AUTO Thousand/uL 244  --  238  --  206    Urine  Lab Units 08/26/23 1315 11/25/22 1021 03/04/22 1107 11/01/21 1405  COLOR U   --  YELLOW YELLOW  --   COLOR UA  Red  --   --   --   CLARITY UA  Turbid  --   --   --   KETONES U MG/DL   --  NEGATIVE NEGATIVE  --   KETONES UA  Positive  --   --   --   PH UA  300.0  --   --   --   UROBILINOGEN UA  >=8.0  --   --   --   PROT/CREAT RATIO UR mg/g creat  --  0.242*  242* 0.275*  275*  --   ALB MG/G CREAT UR mcg/mg creat  --   --   --  36*        No lab exists for component: CYCLOSPORITR     Woodward Brought, MD  [employer], [place]

## 2023-10-13 ENCOUNTER — Ambulatory Visit (HOSPITAL_BASED_OUTPATIENT_CLINIC_OR_DEPARTMENT_OTHER): Admitting: Nurse Practitioner

## 2023-10-13 DIAGNOSIS — Z91199 Patient's noncompliance with other medical treatment and regimen due to unspecified reason: Secondary | ICD-10-CM

## 2023-10-13 DIAGNOSIS — G894 Chronic pain syndrome: Secondary | ICD-10-CM

## 2023-10-14 ENCOUNTER — Encounter: Payer: Self-pay | Admitting: Nurse Practitioner

## 2023-10-14 ENCOUNTER — Ambulatory Visit: Attending: Nurse Practitioner | Admitting: Nurse Practitioner

## 2023-10-14 DIAGNOSIS — M7918 Myalgia, other site: Secondary | ICD-10-CM | POA: Insufficient documentation

## 2023-10-14 DIAGNOSIS — G894 Chronic pain syndrome: Secondary | ICD-10-CM | POA: Diagnosis present

## 2023-10-14 DIAGNOSIS — M961 Postlaminectomy syndrome, not elsewhere classified: Secondary | ICD-10-CM | POA: Diagnosis present

## 2023-10-14 DIAGNOSIS — G8929 Other chronic pain: Secondary | ICD-10-CM | POA: Insufficient documentation

## 2023-10-14 DIAGNOSIS — M545 Low back pain, unspecified: Secondary | ICD-10-CM | POA: Diagnosis present

## 2023-10-14 DIAGNOSIS — M79605 Pain in left leg: Secondary | ICD-10-CM | POA: Diagnosis present

## 2023-10-14 DIAGNOSIS — M549 Dorsalgia, unspecified: Secondary | ICD-10-CM | POA: Insufficient documentation

## 2023-10-14 DIAGNOSIS — Z79891 Long term (current) use of opiate analgesic: Secondary | ICD-10-CM | POA: Diagnosis present

## 2023-10-14 DIAGNOSIS — Z79899 Other long term (current) drug therapy: Secondary | ICD-10-CM | POA: Diagnosis present

## 2023-10-14 DIAGNOSIS — M47812 Spondylosis without myelopathy or radiculopathy, cervical region: Secondary | ICD-10-CM | POA: Diagnosis present

## 2023-10-14 DIAGNOSIS — M47894 Other spondylosis, thoracic region: Secondary | ICD-10-CM | POA: Diagnosis present

## 2023-10-14 DIAGNOSIS — M79602 Pain in left arm: Secondary | ICD-10-CM | POA: Insufficient documentation

## 2023-10-14 MED ORDER — OXYCODONE HCL 5 MG PO TABS: 5.0000 mg | ORAL_TABLET | Freq: Four times a day (QID) | ORAL | 0 refills | Status: DC | PRN

## 2023-10-14 MED ORDER — NALOXONE HCL 4 MG/0.1ML NA LIQD: 1.0000 | NASAL | 0 refills | Status: AC | PRN

## 2023-10-14 MED ORDER — OXYCODONE HCL 5 MG PO TABS
5.0000 mg | ORAL_TABLET | Freq: Four times a day (QID) | ORAL | 0 refills | Status: DC | PRN
Start: 2023-12-16 — End: 2024-01-05

## 2023-10-14 NOTE — Progress Notes (Signed)
 Nursing Pain Medication Assessment:  Safety precautions to be maintained throughout the outpatient stay will include: orient to surroundings, keep bed in low position, maintain call bell within reach at all times, provide assistance with transfer out of bed and ambulation.  Medication Inspection Compliance: Pill count conducted under aseptic conditions, in front of the patient. Neither the pills nor the bottle was removed from the patient's sight at any time. Once count was completed pills were immediately returned to the patient in their original bottle.  Medication: Oxycodone  IR Pill/Patch Count: 14 of 90 pills/patches remain Pill/Patch Appearance: Markings consistent with prescribed medication Bottle Appearance: Standard pharmacy container. Clearly labeled. Filled Date: 7 / 49 / 2025 Last Medication intake:  Today

## 2023-10-14 NOTE — Progress Notes (Signed)
 PROVIDER NOTE: Interpretation of information contained herein should be left to medically-trained personnel. Specific patient instructions are provided elsewhere under Patient Instructions section of medical record. This document was created in part using AI and STT-dictation technology, any transcriptional errors that may result from this process are unintentional.  Patient: Kenneth Osborn  Service: E/M   PCP: Valora Agent, MD  DOB: [birth year]  DOS: 10/14/2023  Provider: Emmy MARLA Blanch, NP  MRN: [id number]  Delivery: Face-to-face  Specialty: Interventional Pain Management  Type: Established Patient  Setting: Ambulatory outpatient facility  Specialty designation: 09  Referring Prov.: Valora Agent, MD  Location: Outpatient office facility       History of present illness (HPI) Kenneth Osborn, a 68 y.o. year old male, is here today because of his No primary diagnosis found.. [name]'s primary complain today is Back Pain  Pertinent problems: 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   Pain Assessment: Severity of Chronic pain is reported as a 4 /10. Location: Back Lower/down right leg, around to stomach. Onset: More than a month ago. Quality: Sharp (especially when urinating). Timing: Constant. Modifying factor(s): pain meds take edge off. Vitals:  height is 5' 9 (1.753 m) and weight is 200 lb (90.7 kg). His temperature is 98.4 F (36.9 C). His blood pressure is 121/80 and his pulse is 90. His respiration is 16 and oxygen  saturation is 100%.  BMI: Estimated body mass index is 29.53 kg/m as calculated from the following:   Height as of this encounter: 5' 9 (1.753 m).   Weight as of this encounter: 200 lb (90.7 kg).  Last encounter: 10/13/2023. Last procedure: Visit date not found.  Reason for encounter: medication management. The patient indicates doing well with the current medication regimen.  No side effects or adverse reaction reported to the medication.  The patient complains of bilateral low back pain however; prescribed pain medication regimen provides relief.   The patient recently presented to the ED with hematuria and was found to have hydronephrosis and acute kidney injury, for which she underwent right ureteral stent placement.  He continues to experience significant pain, and his current medication regimen does not provide adequate relief.  After discussion, we agreed to increase the frequency of oxycodone  for the next 3 months, after which we will return to his original dosing schedule.  Pharmacotherapy Assessment   Oxycodone  (Oxy IR/Roxicodone ) 5 mg immediate release tablet every 6 hours as needed for pain. MME=22.50 Monitoring: [place] PMP: PDMP reviewed during this encounter.       Pharmacotherapy: No side-effects or adverse reactions reported. Compliance: No problems identified. Effectiveness: Clinically acceptable.  Dayna Pulling, RN  10/14/2023  8:29 AM  Sign when Signing Visit Nursing Pain Medication Assessment:  Safety precautions to be maintained throughout the outpatient stay will include: orient to surroundings, keep bed in low position, maintain call bell within reach at all times, provide assistance with transfer out of bed and ambulation.  Medication Inspection Compliance: Pill count conducted under aseptic conditions, in front of the patient. Neither the pills nor the bottle was removed from the patient's sight at  any time. Once count was completed pills were immediately returned to the  patient in their original bottle.  Medication: Oxycodone  IR Pill/Patch Count: 14 of 90 pills/patches remain Pill/Patch Appearance: Markings consistent with prescribed medication Bottle Appearance: Standard pharmacy container. Clearly labeled. Filled Date: 7 / 77 / 2025 Last Medication intake:  Today  UDS:  Summary  Date Value Ref Range Status  09/02/2022 Note  Final    Comment:    ==================================================================== ToxASSURE Select 13 (MW) ==================================================================== Test                             Result       Flag       Units  Drug Present and Declared for Prescription Verification   Desmethyldiazepam              230          EXPECTED   ng/mg creat   Oxazepam                       653          EXPECTED   ng/mg creat   Temazepam                      360          EXPECTED   ng/mg creat    Desmethyldiazepam, oxazepam, and temazepam are expected metabolites    of diazepam . Desmethyldiazepam and oxazepam are also expected    metabolites of other drugs, including chlordiazepoxide, prazepam,    clorazepate, and halazepam. Oxazepam is an expected metabolite of    temazepam. Oxazepam and temazepam are also available as scheduled    prescription medications.    Oxycodone                       587          EXPECTED   ng/mg creat   Oxymorphone                    482          EXPECTED   ng/mg creat   Noroxycodone                   265          EXPECTED   ng/mg creat   Noroxymorphone                 70           EXPECTED   ng/mg creat    Sources of oxycodone  are scheduled prescription medications.    Oxymorphone, noroxycodone, and noroxymorphone are expected    metabolites of oxycodone . Oxymorphone is also available as a    scheduled prescription medication.  Drug Absent but Declared for Prescription Verification   Alprazolam                      Not Detected UNEXPECTED ng/mg  creat ==================================================================== Test                      Result    Flag   Units      Ref Range   Creatinine              83               mg/dL      >=  20 ==================================================================== Declared Medications:  The flagging and interpretation on this report are based on the  following declared medications.  Unexpected results may arise from  inaccuracies in the declared medications.   **Note: The testing scope of this panel includes these medications:   Alprazolam  (Xanax )  Diazepam  (Valium )  Oxycodone  (Roxicodone )   **Note: The testing scope of this panel does not include the  following reported medications:   Aspirin   Cetirizine (Zyrtec)  Cyclobenzaprine  (Flexeril )  Diltiazem  (Cardizem )  Esomeprazole (Nexium)  Ezetimibe  (Zetia )  Gabapentin  (Neurontin )  Insulin  (Humulin )  Isosorbide  (Imdur )  Magnesium  (Mag-Ox)  Meclizine  (Antivert )  Metformin  (Glucophage )  Metoprolol  (Toprol )  Montelukast  (Singulair )  Naloxone  (Narcan )  Nitroglycerin  (Nitrostat )  Prasugrel  (Effient )  Rosuvastatin  (Crestor )  Sennosides (Senokot)  Vitamin B3 ==================================================================== For clinical consultation, please call [phone]. ====================================================================     No results found for: CBDTHCR No results found for: D8THCCBX No results found for: D9THCCBX  ROS  Constitutional: Denies any fever or chills Gastrointestinal: No reported hemesis, hematochezia, vomiting, or acute GI distress Musculoskeletal: Denies any acute onset joint swelling, redness, loss of ROM, or weakness Neurological: No reported episodes of acute onset apraxia, aphasia, dysarthria, agnosia, amnesia, paralysis, loss of coordination, or loss of consciousness  Medication Review  ALPRAZolam , Glucagon, Magnesium  Oxide, acetaminophen , cetirizine, chlorthalidone,  cyclobenzaprine , diazepam , diltiazem , empagliflozin, esomeprazole, ezetimibe , gabapentin , insulin  regular human CONCENTRATED, isosorbide  mononitrate, latanoprost , lubiprostone , meclizine , metFORMIN , metoprolol  succinate, montelukast , multivitamin, naloxone , niacin , nitroGLYCERIN , oxyCODONE , oxybutynin , prasugrel , rosuvastatin , senna-docusate, and tamsulosin   History Review  Allergy: Mr. Andreoni has no known allergies. Drug: Mr. Geer  reports no history of drug use. Alcohol:  reports no history of alcohol use. Tobacco:  reports that he has never smoked. He has never used smokeless tobacco. Social: Mr. Whilden  reports that he has never smoked. He has never used smokeless tobacco. He reports that he does not drink alcohol and does not use drugs. Medical:  has a past medical history of Acute postoperative pain (12/03/2018), Allergic rhinitis (12/30/2012), Anemia, Anginal pain (HCC), Anxiety, Bronchitis, Can't get food down (08/12/2014), Cancer (HCC) (01/2020), Chronic back pain, Concussion (09/2015), Coronary artery disease, DDD (degenerative disc disease), cervical, Dehydration symptoms, Diabetes mellitus without complication (HCC), Dysphagia, GERD (gastroesophageal reflux disease), History of Meniere's disease (12/21/2014), History of thoracic spine surgery (S/P T9-10 IVD spacer) (12/21/2014), Hypercalcemia, Hypercholesteremia, Hyperkalemia, Hyperlipidemia, Hypertension, Meniere's disease, Myocardial infarction (HCC) (2012), Neuromuscular disorder (HCC), Proteinuria, Renal cell carcinoma (HCC), Renal disorder, Secondary hyperparathyroidism of renal origin (HCC), Severe sepsis (HCC) (02/07/2020), Short-segment Barrett's esophagus, and Sleep apnea. Surgical: Mr. Cocuzza  has a past surgical history that includes Cardiovascular stress test; Cardiac catheterization; Labrinthectomy; mastoid shunt (Bilateral); Back surgery; Shoulder arthroscopy with subacromial decompression (Left, 04/06/2012); ARTHRODESIS ANTERIOR  ANTERIOR CERVICLE SPINE (01/04/2013); Colonoscopy with propofol  (N/A, 09/19/2014); Esophagogastroduodenoscopy (N/A, 09/19/2014); Savory dilation (N/A, 09/19/2014); Spinal cord stimulator implant (Right); Cardiac catheterization (N/A, 06/15/2015); Cardiac catheterization (N/A, 06/15/2015); Pulse generator implant (N/A, 01/18/2019); Lumbar spinal cord simulator lead removal (Right, 08/09/2019); IR Perc Cholecystostomy (02/09/2020); IR Radiologist Eval & Mgmt (03/14/2020); Coronary angioplasty; Appendectomy; Robot assisted laparoscopic nephrectomy (Left, 04/20/2020); Colonoscopy with propofol  (N/A, 04/03/2021); Nephrectomy (Left); Colonoscopy with propofol  (N/A, 06/25/2023); and Cystoscopy w/ ureteral stent placement (Right, 08/28/2023). Family: family history includes Cancer in his sister; Diabetes in his maternal grandmother, mother, and paternal grandmother; Heart disease in his father, maternal aunt, maternal uncle, and mother.  Laboratory Chemistry Profile   Renal Lab Results  Component Value Date   BUN 54 (H) 09/03/2023   CREATININE 2.45 (H) 09/03/2023  GFRAA 58 (L) 01/08/2019   GFRNONAA 28 (L) 09/03/2023    Hepatic Lab Results  Component Value Date   AST 31 09/03/2023   ALT 30 09/03/2023   ALBUMIN  2.9 (L) 09/03/2023   ALKPHOS 85 09/03/2023   HCVAB NON REACTIVE 02/08/2020   LIPASE 28 12/11/2020   AMMONIA 24 02/07/2020    Electrolytes Lab Results  Component Value Date   NA 144 09/03/2023   K 3.6 09/03/2023   CL 108 09/03/2023   CALCIUM  9.8 09/03/2023   MG 1.8 09/03/2023   PHOS 4.0 09/03/2023    Bone No results found for: VD25OH, VD125OH2TOT, CI6874NY7, CI7874NY7, 25OHVITD1, 25OHVITD2, 25OHVITD3, TESTOFREE, TESTOSTERONE  Inflammation (CRP: Acute Phase) (ESR: Chronic Phase) Lab Results  Component Value Date   CRP 1.4 (H) 03/22/2015   ESRSEDRATE 9 03/22/2015   LATICACIDVEN 1.2 08/28/2023         Note: Above Lab results reviewed.  Recent Imaging Review  DG  Chest Port 1 View CLINICAL DATA:  Pneumonia.  Choking.  EXAM: PORTABLE CHEST 1 VIEW  COMPARISON:  Chest x-ray 08/28/2023  FINDINGS: Which right-sided central venous catheter tip projects over the distal SVC. The heart size and mediastinal contours are within normal limits. Both lungs are clear. Cervical spinal fusion plate is present. No acute osseous abnormality.  IMPRESSION: No active disease.  Electronically Signed   By: Greig Pique M.D.   On: 08/30/2023 18:47 Note: Reviewed        Physical Exam  Vitals: BP 121/80   Pulse 90   Temp 98.4 F (36.9 C)   Resp 16   Ht 5' 9 (1.753 m)   Wt 200 lb (90.7 kg)   SpO2 100%   BMI 29.53 kg/m  BMI: Estimated body mass index is 29.53 kg/m as calculated from the following:   Height as of this encounter: 5' 9 (1.753 m).   Weight as of this encounter: 200 lb (90.7 kg). Ideal: Ideal body weight: 70.7 kg (155 lb 13.8 oz) Adjusted ideal body weight: 78.7 kg (173 lb 8.3 oz) General appearance: Well nourished, well developed, and well hydrated. In no apparent acute distress Mental status: Alert, oriented x 3 (person, place, & time)       Respiratory: No evidence of acute respiratory distress Eyes: PERLA   Assessment   Diagnosis Status  1. Cervical facet syndrome (Right)   2. 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 Thoracic facet syndrome (T8-10)   4. Chronic upper back pain   5. Chronic musculoskeletal pain   6. Failed back surgical syndrome   7. Chronic lower extremity pain (Left)   8. Chronic upper extremity pain (Left)   9. Chronic low back pain (Bilateral) w/o sciatica   10. Chronic pain syndrome   11. Pharmacologic therapy   12. Chronic use of opiate for therapeutic purpose   13. Encounter for medication management   14. Encounter for chronic pain management   15. Long term prescription benzodiazepine use    Controlled Controlled Controlled   Updated  Problems: No problems updated.  Plan of Care  Problem-specific:  Assessment and Plan Will continue on current medication regimen.  Prescribing drug monitoring (PDMP) reviewed; findings consistent with the use of prescribed medication and no evidence of other narcotic misuse or abuse. Routine UDS ordered today.  Schedule follow-up in 90 days for medication management.  Mr. CASSON CATENA has a current medication list which includes the following long-term medication(s):  cetirizine, cyclobenzaprine , esomeprazole, ezetimibe , gabapentin , humulin  r u-500 kwikpen, metoprolol  succinate, montelukast , niacin , [START ON 10/17/2023] oxycodone , [START ON 11/16/2023] oxycodone , [START ON 12/16/2023] oxycodone , and [Paused] chlorthalidone.  Pharmacotherapy (Medications Ordered): Meds ordered this encounter  Medications   oxyCODONE  (OXY IR/ROXICODONE ) 5 MG immediate release tablet    Sig: Take 1 tablet (5 mg total) by mouth every 6 (six) hours as needed for severe pain (pain score 7-10). Must last 30 days.    Dispense:  120 tablet    Refill:  0    DO NOT: delete (not duplicate); no partial-fill (will deny script to complete), no refill request (F/U required). DISPENSE: 1 day early if closed on fill date. WARN: No CNS-depressants within 8 hrs of med.   oxyCODONE  (OXY IR/ROXICODONE ) 5 MG immediate release tablet    Sig: Take 1 tablet (5 mg total) by mouth every 6 (six) hours as needed for severe pain (pain score 7-10). Must last 30 days.    Dispense:  120 tablet    Refill:  0    DO NOT: delete (not duplicate); no partial-fill (will deny script to complete), no refill request (F/U required). DISPENSE: 1 day early if closed on fill date. WARN: No CNS-depressants within 8 hrs of med.   oxyCODONE  (OXY IR/ROXICODONE ) 5 MG immediate release tablet    Sig: Take 1 tablet (5 mg total) by mouth every 6 (six) hours as needed for severe pain (pain score 7-10). Must last 30 days.    Dispense:  120 tablet    Refill:  0     DO NOT: delete (not duplicate); no partial-fill (will deny script to complete), no refill request (F/U required). DISPENSE: 1 day early if closed on fill date. WARN: No CNS-depressants within 8 hrs of med.   naloxone  (NARCAN ) nasal spray 4 mg/0.1 mL    Sig: Place 1 spray into the nose as needed for up to 365 doses (for opioid-induced respiratory depresssion). In case of emergency (overdose), spray once into each nostril. If no response within 3 minutes, repeat application and call 911.    Dispense:  1 each    Refill:  0    Instruct patient in proper use of device.   Orders:  Orders Placed This Encounter  Procedures   ToxASSURE Select 13 (MW), Urine    Volume: 30 ml(s). Minimum 3 ml of urine is needed. Document temperature of fresh sample. Indications: Long term (current) use of opiate analgesic (S20.108)    Release to patient:   Immediate        Return in about 3 months (around 01/14/2024) for (F2F), (MM), Emmy Blanch NP.    Recent Visits Date Type Provider Dept  10/13/23 Office Visit Madia Carvell K, NP [facility]  Showing recent visits within past 90 days and meeting all other requirements Today's Visits Date Type Provider Dept  10/14/23 Office Visit Charliegh Vasudevan K, NP [facility]  Showing today's visits and meeting all other requirements Future Appointments Date Type Provider Dept  01/06/24 Appointment Mozetta Murfin K, NP [facility]  Showing future appointments within next 90 days and meeting all other requirements  I discussed the assessment and treatment plan with the patient. The patient was provided an opportunity to ask questions and all were answered. The patient agreed with the plan and demonstrated an understanding of the instructions.  Patient advised to call back or seek an in-person evaluation if the symptoms or condition worsens.  Duration of encounter: 30 minutes.  Total  time on encounter, as per AMA guidelines included both the  face-to-face and non-face-to-face time personally spent by the physician and/or other qualified health care professional(s) on the day of the encounter (includes time in activities that require the physician or other qualified health care professional and does not include time in activities normally performed by clinical staff). Physician's time may include the following activities when performed: Preparing to see the patient (e.g., pre-charting review of records, searching for previously ordered imaging, lab work, and nerve conduction tests) Review of prior analgesic pharmacotherapies. Reviewing PMP Interpreting ordered tests (e.g., lab work, imaging, nerve conduction tests) Performing post-procedure evaluations, including interpretation of diagnostic procedures Obtaining and/or reviewing separately obtained history Performing a medically appropriate examination and/or evaluation Counseling and educating the patient/family/caregiver Ordering medications, tests, or procedures Referring and communicating with other health care professionals (when not separately reported) Documenting clinical information in the electronic or other health record Independently interpreting results (not separately reported) and communicating results to the patient/ family/caregiver Care coordination (not separately reported)  Note by: Markella Dao K Reason Helzer, NP (TTS and AI technology used. I apologize for any typographical errors that were not detected and corrected.) Date: 10/14/2023; Time: 9:28 AM

## 2023-10-16 LAB — TOXASSURE SELECT 13 (MW), URINE

## 2023-10-21 ENCOUNTER — Other Ambulatory Visit: Payer: Self-pay | Admitting: Urology

## 2023-11-13 NOTE — Patient Instructions (Signed)
 SURGICAL WAITING ROOM VISITATION  Patients having surgery or a procedure may have no more than 2 support people in the waiting area - these visitors may rotate.    Children under the age of 12 must have an adult with them who is not the patient.  Visitors with respiratory illnesses are discouraged from visiting and should remain at home.  If the patient needs to stay at the hospital during part of their recovery, the visitor guidelines for inpatient rooms apply. Pre-op nurse will coordinate an appropriate time for 1 support person to accompany patient in pre-op.  This support person may not rotate.    Please refer to the [facility] website for the visitor guidelines for Inpatients (after your surgery is over and you are in a regular room).       Your procedure is scheduled on:  11/24/2023    Report to [facility] Main Entrance    Report to admitting at 115 pm    Call this number if you have problems the morning of surgery [phone]   Do not eat food :After Midnight.   After Midnight you may have the following liquids until _ 1200 noon _____ AM DAY OF SURGERY  Water  Non-Citrus Juices (without pulp, NO RED-Apple, White grape, White cranberry) Black Coffee (NO MILK/CREAM OR CREAMERS, sugar ok)  Clear Tea (NO MILK/CREAM OR CREAMERS, sugar ok) regular and decaf                             Plain Jell-O (NO RED)                                           Fruit ices (not with fruit pulp, NO RED)                                     Popsicles (NO RED)                                                               Sports drinks like Gatorade (NO RED)                            If you have questions, please contact your surgeon's office.      Oral Hygiene is also important to reduce your risk of infection.                                    Remember - BRUSH YOUR TEETH THE MORNING OF SURGERY WITH YOUR REGULAR TOOTHPASTE  DENTURES WILL BE REMOVED PRIOR TO SURGERY PLEASE DO  NOT APPLY Poly grip OR ADHESIVES!!!   Do NOT smoke after Midnight   Stop all vitamins and herbal supplements 7 days before surgery.   Take these medicines the morning of surgery with A SIP OF WATER :  zyrtec, cardizem ,. Imdur , nexium, gabapention, toprol ,  Jardiance hold for 72 hours prior to procedure.  Last dose on 11/20/2023.  Metformion- none am of surgery         Humulin  R U-500-   DO NOT TAKE ANY ORAL DIABETIC MEDICATIONS DAY OF YOUR SURGERY  Bring CPAP mask and tubing day of surgery.                              You may not have any metal on your body including hair pins, jewelry, and body piercing             Do not wear make-up, lotions, powders, perfumes/cologne, or deodorant  Do not wear nail polish including gel and S&S, artificial/acrylic nails, or any other type of covering on natural nails including finger and toenails. If you have artificial nails, gel coating, etc. that needs to be removed by a nail salon please have this removed prior to surgery or surgery may need to be canceled/ delayed if the surgeon/ anesthesia feels like they are unable to be safely monitored.   Do not shave  48 hours prior to surgery.               Men may shave face and neck.   Do not bring valuables to the hospital. Agency IS NOT             RESPONSIBLE   FOR VALUABLES.   Contacts, glasses, dentures or bridgework may not be worn into surgery.   Bring small overnight bag day of surgery.   DO NOT BRING YOUR HOME MEDICATIONS TO THE HOSPITAL. PHARMACY WILL DISPENSE MEDICATIONS LISTED ON YOUR MEDICATION LIST TO YOU DURING YOUR ADMISSION IN THE HOSPITAL!    Patients discharged on the day of surgery will not be allowed to drive home.  Someone NEEDS to stay with you for the first 24 hours after anesthesia.   Special Instructions: Bring a copy of your healthcare power of attorney and living will documents the day of surgery if you haven't scanned them before.               Please read over the following fact sheets you were given: IF YOU HAVE QUESTIONS ABOUT YOUR PRE-OP INSTRUCTIONS PLEASE CALL [phone].   If you received a COVID test during your pre-op visit  it is requested that you wear a mask when out in public, stay away from anyone that may not be feeling well and notify your surgeon if you develop symptoms. If you test positive for Covid or have been in contact with anyone that has tested positive in the last 10 days please notify you surgeon.    [place] - Preparing for Surgery Before surgery, you can play an important role.  Because skin is not sterile, your skin needs to be as free of germs as possible.  You can reduce the number of germs on your skin by washing with CHG (chlorahexidine gluconate) soap before surgery.  CHG is an antiseptic cleaner which kills germs and bonds with the skin to continue killing germs even after washing. Please DO NOT use if you have an allergy to CHG or antibacterial soaps.  If your skin becomes reddened/irritated stop using the CHG and inform your nurse when you arrive at Short Stay. Do not shave (including legs and underarms) for at least 48 hours prior to the first CHG shower.  You may shave your face/neck. Please follow these instructions carefully:  1.  Shower with CHG Soap the night before surgery and the  morning of Surgery.  2.  If you choose to wash your hair, wash your hair first as usual with your  normal  shampoo.  3.  After you shampoo, rinse your hair and body thoroughly to remove the  shampoo.                           4.  Use CHG as you would any other liquid soap.  You can apply chg directly  to the skin and wash                       Gently with a scrungie or clean washcloth.  5.  Apply the CHG Soap to your body ONLY FROM THE NECK DOWN.   Do not use on face/ open                           Wound or open sores. Avoid contact with eyes, ears mouth and genitals (private parts).                       Wash face,   Genitals (private parts) with your normal soap.             6.  Wash thoroughly, paying special attention to the area where your surgery  will be performed.  7.  Thoroughly rinse your body with warm water  from the neck down.  8.  DO NOT shower/wash with your normal soap after using and rinsing off  the CHG Soap.                9.  Pat yourself dry with a clean towel.            10.  Wear clean pajamas.            11.  Place clean sheets on your bed the night of your first shower and do not  sleep with pets. Day of Surgery : Do not apply any lotions/deodorants the morning of surgery.  Please wear clean clothes to the hospital/surgery center.  FAILURE TO FOLLOW THESE INSTRUCTIONS MAY RESULT IN THE CANCELLATION OF YOUR SURGERY PATIENT SIGNATURE_________________________________  NURSE SIGNATURE__________________________________  ________________________________________________________________________

## 2023-11-13 NOTE — Progress Notes (Addendum)
 Anesthesia Review:  PCP:  Lynwood Null - LVo 09/11/23  Cardiologist : Paraschos  Therisa Pierre LVO 04/16/23  Neph- Kolluni LOV 10/28/23, PA   Endo- O'Connell LOV 10/22/23   PPM/ ICD: Device Orders: Rep Notified:   Hx of Spinal Cord Stimulator - has been removed per pt   Chest x-ray : 08/30/23- 1 view  EKG : 08/28/2023  Echo : 08/29/23  Stress test: 2017  Cardiac Cath :  2017   Activity level: uses can cannot do a flgiht of stairs without difficutly  Sleep Study/ CPAP : no cpap has not used in 10 years per pt  Fasting Blood Sugar :      / Checks Blood Sugar -- times a day:      DM- type 2-  Fereestyle Libre  Hgba1c-  10/22/23- 7.5  Humulin  R- U-500 - pt to contact Dr Rawleigh  and obtain instructions on 11/17/23  Jardiance- hold for 72 hours prior to procedurel last dose on 11/20/2023  Metformin - none am of surgery   Blood Thinner/ Instructions /Last Dose: ASA / Instructions/ Last Dose :   Prasugrel  ( Effient ) pt states to stop a week prior- last dose on 11/20/2023 per pt    11/17/23- Called and LVMM for pt at 0915am.  Asked for call back.   Called wife .  PT thought appt was at 1100am with Ubaldo eagles. Explained to pt and wife this is a preop appt and not with Ubaldo eagles.  Informed them not sure who that is.  AT 81- Spoke with wife and then with pt and reviewed med hx and preop instructions via phone.  Med hx completed.  Preop instructions completed.  PT has phone number of [phone] if any quesitons prior to surgery.  PT voiced understanding.  PT aware to contact Dr Cherilyn  for u-500  Insulin  instrucitons .  PT again voiced understanding.     PT reports at preop that had diarrhea starting on 11/07/23 and lasted until 11/15/23.  Was at nephrology and mentioned to them.  Nothing done per pt.  Diarrhea resolved on 11/15/23 per pt.     Deaf in right ear  Uses cane

## 2023-11-17 ENCOUNTER — Encounter (HOSPITAL_COMMUNITY): Payer: Self-pay

## 2023-11-17 ENCOUNTER — Other Ambulatory Visit: Payer: Self-pay

## 2023-11-17 ENCOUNTER — Encounter (HOSPITAL_COMMUNITY)
Admission: RE | Admit: 2023-11-17 | Discharge: 2023-11-17 | Disposition: A | Discharge status: Home / Self Care | Source: Ambulatory Visit | Attending: Anesthesiology | Admitting: Anesthesiology

## 2023-11-17 VITALS — Ht 69.0 in | Wt 195.0 lb

## 2023-11-17 DIAGNOSIS — Z01818 Encounter for other preprocedural examination: Secondary | ICD-10-CM

## 2023-11-18 ENCOUNTER — Encounter (HOSPITAL_COMMUNITY): Payer: Self-pay

## 2023-11-18 NOTE — Progress Notes (Signed)
 Case: [id number] Date/Time: 11/24/23 1500   Procedures:      CYSTOSCOPY, WITH BIOPSY (Right)     CYSTOSCOPY, WITH RETROGRADE PYELOGRAM (Right)   Anesthesia type: General   Diagnosis:      Hematuria, gross [R31.0]     History of kidney cancer [Z85.528]   Pre-op diagnosis: GROSS HEMATURIA, HISTORY OF KIDNEY CANCER   Location: [facility] ROOM 03 / [facility]   Surgeons: Renda Glance, MD       DISCUSSION: Kenneth Osborn is a 68 yo male with PMH of HTN, hx of MI, CAD s/p LAD stent (2012) and RCA stent (2017), OSA (no CPAP), Meniere's disease, GERD, IDDM, CKD3, RCC s/p left nephrectomy (2022), s/p ACDF C5-C6 (2014).  Patient admitted from 7/8-7/16 for hematuria. He went to the OR on 7/10 for cysto and R stent placement. Underwent CBI. Also had AKI on CKD and underwent CRRT. Kidney function back to baseline at discharge.  Seen by PCP on 7/24 for hospital f/u. Stable at that visit.  Seen by Endocrine on 10/22/23. A1c 7.5.  Seen by Nephrology on 9/9. Kidney function stable.  Patient follows with Cardiology at [facility] for CAD s/p PCI to LAD in 2012 and PCI to RCA in 2017 Last seen in 03/2023. 2D echocardiogram on 08/22/2021 revealed normal left ventricular function with LVEF 55% with trivial valvular insufficiencies. The patient underwent ETT Myoview 08/29/2021, exercising for 6 minutes on a Bruce protocol, and experienced shortness of breath and claudication.  Gated scintigraphy revealed LVEF 53%. There was no evidence of scar or ischemia. Stable at that visit. Advised f/u in 6 months.  LD Effient : 10/2  VS: Ht 5' 9 (1.753 m)   Wt 88.5 kg   BMI 28.80 kg/m   PROVIDERS: Stanton Lynwood FALCON, MD   LABS: Obtain DOS. Baseline SCr 1.8   CXR 08/30/23:  FINDINGS: Which right-sided central venous catheter tip projects over the distal SVC. The heart size and mediastinal contours are within normal limits. Both lungs are clear. Cervical spinal fusion plate is present. No acute osseous abnormality.    IMPRESSION: No active disease.  EKG 08/28/23:  Sinus rhythm Borderline prolonged PR interval Left posterior fascicular block Anteroseptal infarct, age indeterminate ST elevation, consider inferior injury Lateral leads are also involved 12 Lead; Mason-Likar  Echo 08/29/23:  IMPRESSIONS    1. Left ventricular ejection fraction, by estimation, is 60 to 65%. The left ventricle has normal function. The left ventricle has no regional wall motion abnormalities. There is mild left ventricular hypertrophy. Left ventricular diastolic parameters are indeterminate.  2. Right ventricular systolic function is normal. The right ventricular size is not well visualized.  3. The mitral valve is degenerative. No evidence of mitral valve regurgitation.  4. The aortic valve is calcified. Aortic valve regurgitation is not visualized. Aortic valve sclerosis/calcification is present, without any evidence of aortic stenosis.  Left heart cath 06/15/2015:  Mid Cx to Dist Cx lesion, 60% stenosed. Dist Cx lesion, 10% stenosed. The lesion was previously treated with a stent (unknown type). Mid LAD to Dist LAD lesion, 10% stenosed. The lesion was previously treated with a stent (unknown type). Dist RCA lesion, 45% stenosed. 1st RPLB lesion, 85% stenosed. RPDA lesion, 70% stenosed. Mid RCA lesion, 75% stenosed. Post intervention, there is a 0% residual stenosis.   1. Successful PCI with DES of mid RCA   Past Medical History:  Diagnosis Date   Allergic rhinitis 12/30/2012   Anginal pain    Bronchitis    hx of  Chronic back pain    thoracic area   Concussion 09/2015   Coronary artery disease    99% blockage   DDD (degenerative disc disease), cervical    Diabetes mellitus without complication (HCC)    insulin  dependent   Dysphagia    GERD (gastroesophageal reflux disease)    History of Meniere's disease 12/21/2014   History of thoracic spine surgery (S/P T9-10 IVD spacer) 12/21/2014    Hypercalcemia    Hypercholesteremia    Hyperkalemia    Hyperlipidemia    Hypertension    sees Dr. Norleen walker [facility]   Myocardial infarction [facility]) 2012   [facility] Dr. J[facility]   Neuromuscular disorder [facility])    diabetic neuropathy in feet   Proteinuria    Renal cell carcinoma (HCC)    Renal disorder    Secondary hyperparathyroidism of renal origin    Severe sepsis (HCC) 02/07/2020   Short-segment Barrett's esophagus    Sleep apnea    mild    Past Surgical History:  Procedure Laterality Date   APPENDECTOMY     age 58   ARTHRODESIS ANTERIOR ANTERIOR CERVICLE SPINE  01/04/2013   BACK SURGERY     fusion thoracic area   CARDIAC CATHETERIZATION     may 2012 and Nov 20, 2010   CARDIAC CATHETERIZATION N/A 06/15/2015   Procedure: Left Heart Cath and Coronary Angiography;  Surgeon: Wolm JINNY Rhyme, MD;  Location: [facility] INVASIVE CV LAB;  Service: Cardiovascular;  Laterality: N/A;   CARDIAC CATHETERIZATION N/A 06/15/2015   Procedure: Coronary Stent Intervention;  Surgeon: Marsa Dooms, MD;  Location: [facility] INVASIVE CV LAB;  Service: Cardiovascular;  Laterality: N/A;   CARDIOVASCULAR STRESS TEST     jan 2014   COLONOSCOPY WITH PROPOFOL  N/A 09/19/2014   Procedure: COLONOSCOPY WITH PROPOFOL ;  Surgeon: Lamar ONEIDA Holmes, MD;  Location: [facility] ENDOSCOPY;  Service: Endoscopy;  Laterality: N/A;   COLONOSCOPY WITH PROPOFOL  N/A 04/03/2021   Procedure: COLONOSCOPY WITH PROPOFOL ;  Surgeon: Maryruth Ole ONEIDA, MD;  Location: [facility] ENDOSCOPY;  Service: Endoscopy;  Laterality: N/A;  IDDM   COLONOSCOPY WITH PROPOFOL  N/A 06/25/2023   Procedure: COLONOSCOPY WITH PROPOFOL ;  Surgeon: Toledo, Ladell POUR, MD;  Location: [facility] ENDOSCOPY;  Service: Gastroenterology;  Laterality: N/A;  IDDM, 1ST CASE   CORONARY ANGIOPLASTY     stent placement x3   coronary stents      x 3 per pt   CYSTOSCOPY W/ URETERAL STENT PLACEMENT Right 08/28/2023   Procedure: CYSTOSCOPY, WITH RETROGRADE PYELOGRAM AND  URETERAL STENT INSERTION;  Surgeon: Twylla Glendia BROCKS, MD;  Location: [facility] ORS;  Service: Urology;  Laterality: Right;  RIGHT STENT PLACEMENT, right RETROGRADE PYELOGRAM   ESOPHAGOGASTRODUODENOSCOPY N/A 09/19/2014   Procedure: ESOPHAGOGASTRODUODENOSCOPY (EGD);  Surgeon: Lamar ONEIDA Holmes, MD;  Location: [facility] ENDOSCOPY;  Service: Endoscopy;  Laterality: N/A;   IR PERC CHOLECYSTOSTOMY  02/09/2020   IR RADIOLOGIST EVAL & MGMT  03/14/2020   LABRINTHECTOMY     1999 right ear   LUMBAR SPINAL CORD SIMULATOR LEAD REMOVAL Right 08/09/2019   Procedure: REMOVAL SPINAL CORD STIMULATOR PERCUTANEOUS LEADS, REMOVAL PULSE GENERATOR;  Surgeon: Bluford Standing, MD;  Location: [facility] ORS;  Service: Neurosurgery;  Laterality: Right;  LOCAL WITH MAC   mastoid shunt Bilateral    left, 2002, 1997 right ear, 1980 right ear   NEPHRECTOMY Left    PULSE GENERATOR IMPLANT N/A 01/18/2019   Procedure: MEDTRONIC SPINAL CORD STIMULATOR BATTERY EXCHANGE;  Surgeon: Bluford Standing, MD;  Location: [facility] ORS;  Service: Neurosurgery;  Laterality:  N/A;   ROBOT ASSISTED LAPAROSCOPIC NEPHRECTOMY Left 04/20/2020   Procedure: XI ROBOTIC ASSITED LAPAROSCOPIC NEPHRECTOMY;  Surgeon: Renda Glance, MD;  Location[facility];  Service: Urology;  Laterality: Left;   SAVORY DILATION N/A 09/19/2014   Procedure: SAVORY DILATION;  Surgeon: Lamar ONEIDA Holmes, MD;  Location: [facility] ENDOSCOPY;  Service: Endoscopy;  Laterality: N/A;   SHOULDER ARTHROSCOPY WITH SUBACROMIAL DECOMPRESSION Left 04/06/2012   Procedure: SHOULDER ARTHROSCOPY WITH SUBACROMIAL DECOMPRESSION;  Surgeon: Marcey Raman, MD;  Location: [facility] OR;  Service: Orthopedics;  Laterality: Left;  left shoulder arthroscopy, subacromial decompression and distal clavicle resection   SPINAL CORD STIMULATOR IMPLANT Right     MEDICATIONS:  acetaminophen  (TYLENOL ) 325 MG tablet   ALPRAZolam  (XANAX ) 0.5 MG tablet   cetirizine (ZYRTEC) 10 MG tablet   [Paused] chlorthalidone (HYGROTON) 25 MG tablet    cyclobenzaprine  (FLEXERIL ) 10 MG tablet   diazepam  (VALIUM ) 5 MG tablet   diltiazem  (CARDIZEM  CD) 240 MG 24 hr capsule   esomeprazole (NEXIUM) 40 MG capsule   ezetimibe  (ZETIA ) 10 MG tablet   gabapentin  (NEURONTIN ) 800 MG tablet   HUMULIN  R U-500 KWIKPEN 500 UNIT/ML KwikPen   isosorbide  mononitrate (IMDUR ) 30 MG 24 hr tablet   latanoprost  (XALATAN ) 0.005 % ophthalmic solution   lubiprostone  (AMITIZA ) 24 MCG capsule   Magnesium  Oxide 250 MG TABS   meclizine  (ANTIVERT ) 25 MG tablet   metFORMIN  (GLUCOPHAGE ) 1000 MG tablet   metoprolol  succinate (TOPROL -XL) 100 MG 24 hr tablet   montelukast  (SINGULAIR ) 10 MG tablet   multivitamin (RENA-VIT) TABS tablet   naloxone  (NARCAN ) nasal spray 4 mg/0.1 mL   niacin  (NIASPAN ) 500 MG CR tablet   nitroGLYCERIN  (NITROSTAT ) 0.4 MG SL tablet   oxybutynin  (DITROPAN ) 5 MG tablet   oxyCODONE  (OXY IR/ROXICODONE ) 5 MG immediate release tablet   oxyCODONE  (OXY IR/ROXICODONE ) 5 MG immediate release tablet   [START ON 12/16/2023] oxyCODONE  (OXY IR/ROXICODONE ) 5 MG immediate release tablet   prasugrel  (EFFIENT ) 10 MG TABS tablet   rosuvastatin  (CRESTOR ) 40 MG tablet   senna-docusate (SENOKOT-S) 8.6-50 MG tablet   tamsulosin  (FLOMAX ) 0.4 MG CAPS capsule   No current facility-administered medications for this encounter.   Burnard CHRISTELLA Odis DEVONNA [facility] Short Stay/Anesthesiology [facility] Phone [phone] 11/18/2023 11:25 AM

## 2023-11-18 NOTE — Anesthesia Preprocedure Evaluation (Addendum)
 Anesthesia Evaluation  Patient identified by MRN, date of birth, ID band Patient awake    Reviewed: Allergy & Precautions, NPO status , Patient's Chart, lab work & pertinent test results, reviewed documented beta blocker date and time   History of Anesthesia Complications Negative for: history of anesthetic complications  Airway Mallampati: II  TM Distance: >3 FB Neck ROM: Full    Dental  (+) Dental Advisory Given, Chipped   Pulmonary sleep apnea (does not use CPAP) , COPD,  COPD inhaler   breath sounds clear to auscultation       Cardiovascular hypertension, Pt. on medications and Pt. on home beta blockers (-) angina + CAD, + Past MI and + Cardiac Stents  + dysrhythmias Supra Ventricular Tachycardia  Rhythm:Regular Rate:Normal  08/2023 ECHO: EF 60 to 65%.  1.The LV has normal function, no regional wall motion abnormalities. There is mild LVH  2. Right ventricular systolic function is normal. The right ventricular  size is not well visualized.   3. The mitral valve is degenerative. No evidence of mitral valve regurgitation.   4. The aortic valve is calcified. Aortic valve regurgitation is not visualized. Aortic valve sclerosis/calcification is present, without any evidence of aortic stenosis.     Neuro/Psych  Headaches  Anxiety     Chronic pain: spinal cord stimulator, opioids    GI/Hepatic Neg liver ROS,GERD  Medicated and Controlled,,  Endo/Other  diabetes (glu 210), Insulin  Dependent, Oral Hypoglycemic Agents    Renal/GU Renal InsufficiencyRenal diseaseH/o renal cell Ca     Musculoskeletal  (+) Arthritis ,    Abdominal   Peds  Hematology   Anesthesia Other Findings   Reproductive/Obstetrics                              Anesthesia Physical Anesthesia Plan  ASA: 3  Anesthesia Plan: General   Post-op Pain Management: Tylenol  PO (pre-op)*   Induction: Intravenous  PONV Risk Score  and Plan: 2 and Ondansetron  and Dexamethasone   Airway Management Planned: LMA  Additional Equipment: None  Intra-op Plan:   Post-operative Plan:   Informed Consent: I have reviewed the patients History and Physical, chart, labs and discussed the procedure including the risks, benefits and alternatives for the proposed anesthesia with the patient or authorized representative who has indicated his/her understanding and acceptance.     Dental advisory given  Plan Discussed with: CRNA and Surgeon  Anesthesia Plan Comments: (See PAT note from 9/29)         Anesthesia Quick Evaluation

## 2023-11-21 NOTE — H&P (Signed)
 Office Visit Report     11/18/2023   --------------------------------------------------------------------------------   Kenneth Osborn  MRN: [id number]  DOB: Dec 31, 1955, 68 year old Male  SSN:    PRIMARY CARE:  Redell Burnet, MD  PRIMARY CARE FAX:  [phone]  REFERRING:  Ubaldo FABIENE Eagles, NP  PROVIDER:  Gretel Ferrara, M.D.  TREATING:  Ubaldo Eagles, NP  LOCATION:  [facility] [phone]     --------------------------------------------------------------------------------   CC/HPI: Renal cell carcinoma   07/25/2023: Kenneth Osborn returns today 3 years out from his left radical nephrectomy. He denies hematuria, unintentional weight loss, night sweats, etc. Approximately 8 to 9 months ago, he did develop increased urinary urgency and urge incontinence. He also noticed bowel urgency and bowel incontinence. He does have a history of thoracic back issues and is status post what sounds like a thoracic fusion possibly in the past. This was done at [facility]. He does not have incontinence every day but does wear a pad now.   10/13/2023: Patient seen today accompanied by his wife for hospital follow-up. He was admitted in [place] last month from 7/8 through 7/16. Presented to the emergency department with worsening gross hematuria that had began about 1 day prior to hospital admission. CT imaging obtained showed hyperdense right collecting system consistent with blood products. Initial creatinine was 1.7 but then rapidly increased to 6.63. Dr. Twylla consulted patient was taken for cystoscopy with right retrograde pyelogram and right ureteral stent placement. Retrograde pyelogram revealed a normal caliber ureter without dilation or filling defect. There was moderate right hydronephrosis with filling defects in the renal pelvis and calyceal most likely secondary to a clot. Ureteral stent was placed. Urine cytology obtained which ultimately showed atypical urothelial cells thought to  be more reactive in nature. Foley catheter placed at the conclusion of the procedure, he was started on CBI. He required admission into the ICU for acute renal failure and was started on a couple days of CRRT. Nephrology consulted and followed. Patient was able to pass a hospital trial of void, creatinine slowly improved and was 2.45 at time of discharge. He had a negative urine culture in the hospital. He was also started on tamsulosin  and oxybutynin  for management of stent related symptoms. He has since followed back up with nephrology, creatinine now 2.1 as of 7/24.   Seen today for repeat evaluation. He has tolerated his stent poorly. He is endorsing intermittent right sided discomfort with radiating pain into both testicles, bothersome frequency/urgency and occasional dysuria. He has not had significant gross hematuria. He denies posthospital discharge fever/chills or nausea/vomiting but feels poorly. He has trouble sitting for prolonged periods of time. He is trying to stay hydrated and nourished but does endorse some anorexia. He denies constipation.   11/18/2023: Patient now scheduled with Dr. Ferrara to undergo cystoscopy with right stent removal, right retrograde pyelogram with right diagnostic ureteroscopy with possible biopsy and washing for cytology. This is scheduled on 10/6. He has received appropriate clearances hold his Effient  in anticipation of the procedure. Most recent creatinine was 1.8. Of note A1c was 7.5.   Symptoms noted at time of last exam continue. He still struggles with increased urgency, some urge incontinence as well as hesitancy/straining and pain with urination. Hematuria for the most part has significantly improved. He feels that continuing tamsulosin  and oxybutynin  which I prescribed at last exam have made things somewhat tolerable. He continues on oxycodone  from his pain management clinic. He has been having some  intermittent bouts of diarrhea as well. Currently denying  fever/chills, nausea/vomiting. Denies any recent chest pain shortness of breath.     ALLERGIES: No Known Drug Allergies    MEDICATIONS: metFORMIN  HCl 1000 MG Tablet  Metoprolol  Succinate ER 100 MG Tablet Extended Release 24 Hour  oxyBUTYnin  Chloride 5 MG Tablet 1 tablet PO TID PRN  Tamsulosin  HCl 0.4 MG Capsule 1 capsule PO Daily  ALPRAZolam  0.5 MG Tablet  Amitiza  24 MCG Capsule  Cetirizine HCl 10 MG Tablet  Cyclobenzaprine  HCl 10 MG Tablet  diazePAM  5 MG Tablet  dilTIAZem  HCl ER Coated Beads 240 MG Capsule Extended Release 24 Hour  Enalapril  Maleate 10 MG Tablet  Esomeprazole Magnesium  40 MG Capsule Delayed Release  Ezetimibe  10 MG Tablet  Fish Oil  Gabapentin  800 MG Tablet  Humulin  R U-500  Isosorbide  Mononitrate ER 30 MG Tablet Extended Release 24 Hour  Magnesium  Oxide  Meclizine  HCl 25 MG Tablet  Montelukast  Sodium 10 MG Tablet  Niacin  50 MG Tablet  Nitroglycerin  0.4 MG Tablet Sublingual  oxyCODONE  HCl 10 MG Tablet  Prasugrel  HCl 10 MG Tablet     GU PSH: None     PSH Notes: ear surgery, shoulder surgery   NON-GU PSH: Back Surgery (Unspecified) Visit Complexity (formerly GPC1X) - 10/13/2023, 07/25/2023     GU PMH: Acute kidney failure - 10/13/2023 Gross hematuria - 10/13/2023 Incomplete bladder emptying - 10/13/2023 Suprapubic pain - 10/13/2023 Urinary Frequency (Stable) - 10/13/2023, - 2022, - 2022 History of kidney cancer - 07/25/2023, - 07/26/2022, - 2023 Urge incontinence - 07/25/2023 Flank Pain - 07/23/2023, - 2022, - 2022 Renal cell carcinoma, left - 07/23/2023, - 07/23/2022, - 2023, - 2022, - 2022, - 2022 Left renal neoplasm - 2022      PMH Notes:   1) Renal cell carcinoma: He is s/p a left RAL radical nephrectomy (attempted partial nephrectomy) for a 5.3 cm incidentally detected left renal mass on 04/20/20. He underwent cholecystectomy at the same time by Dr. Sheldon.   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   Tobacco Use Assessment Completed: Used Tobacco in last 30 days? Has never drank.  Drinks 1 caffeinated drink per day.    REVIEW OF SYSTEMS:    GU Review Male:   Patient reports frequent urination, hard to postpone urination, burning/ pain with urination, get up at night to urinate, leakage of urine, stream starts and stops, and trouble starting your stream. Patient denies have to strain to urinate , erection problems, and penile pain.  Gastrointestinal (Upper):   Patient reports nausea. Patient denies vomiting and indigestion/ heartburn.  Gastrointestinal (Lower):   Patient reports diarrhea. Patient denies constipation.  Constitutional:   Patient reports fatigue. Patient denies fever, night sweats, and weight loss.  Skin:   Patient denies skin rash/ lesion and itching.  Eyes:   Patient denies blurred vision and double vision.  Ears/ Nose/ Throat:   Patient denies sore throat and sinus problems.  Hematologic/Lymphatic:   Patient denies swollen glands and easy bruising.  Cardiovascular:   Patient denies leg swelling and chest pains.  Respiratory:  Patient denies cough and shortness of breath.  Endocrine:   Patient denies excessive thirst.  Musculoskeletal:   Patient reports back pain and joint pain.   Neurological:   Patient denies headaches and dizziness.  Psychologic:   Patient denies depression and anxiety.   VITAL SIGNS:      11/18/2023 11:13 AM  BP 134/73 mmHg  Pulse 58 /min  Temperature 97.8 F / 36.5 C   MULTI-SYSTEM PHYSICAL EXAMINATION:    Constitutional: Well-nourished. No  physical deformities. Normally developed. Good grooming.  Neck: Neck symmetrical, not swollen. Normal tracheal position.  Respiratory: No labored breathing, no use of accessory muscles.   Cardiovascular: Normal temperature, normal extremity pulses, no swelling, no varicosities.  Skin: No paleness, no jaundice, no cyanosis. No lesion, no ulcer, no rash.  Neurologic / Psychiatric: Oriented to time, oriented to place, oriented to person. No depression, no anxiety, no agitation.  Gastrointestinal: Obese abdomen. No mass, no tenderness, no rigidity.   Musculoskeletal: Normal gait and station of head and neck.     Complexity of Data:  Source Of History:  Patient, Family/Caregiver, Medical Record Summary  Lab Test Review:   BMP, Hgb-A1c  Records Review:   Pathology Reports, Previous Doctor Records, Previous Hospital Records, Previous Patient Records  Urine Test Review:   Urinalysis, Urine Culture  X-Ray Review: C.T. Abdomen/Pelvis: Reviewed Films. Reviewed Report.     11/18/23  Urinalysis  Urine Appearance Slightly Cloudy   Urine Color Straw   Urine Glucose 2+ mg/dL  Urine Bilirubin Neg mg/dL  Urine Ketones Neg mg/dL  Urine Specific Gravity 1.020   Urine Blood 2+ ery/uL  Urine pH <=5.0   Urine Protein 1+ mg/dL  Urine Urobilinogen 0.2 mg/dL  Urine Nitrites Neg   Urine Leukocyte Esterase Neg leu/uL  Urine WBC/hpf 0 - 5/hpf   Urine RBC/hpf 3 - 10/hpf   Urine Epithelial Cells 0 - 5/hpf   Urine Bacteria Few (10-25/hpf)   Urine Mucous Present   Urine Yeast NS (Not Seen)   Urine Trichomonas Not Present   Urine Cystals NS (Not Seen)   Urine Casts NS (Not Seen)   Urine Sperm Not Present    PROCEDURES:          Visit Complexity - G2211          Urinalysis w/Scope Dipstick Dipstick Cont'd Micro  Color: Straw Bilirubin: Neg mg/dL WBC/hpf: 0 - 5/hpf  Appearance: Slightly Cloudy Ketones: Neg mg/dL RBC/hpf: 3 - 89/yeq  Specific Gravity: 1.020 Blood: 2+ ery/uL Bacteria: Few (10-25/hpf)   pH: <=5.0 Protein: 1+ mg/dL Cystals: NS (Not Seen)  Glucose: 2+ mg/dL Urobilinogen: 0.2 mg/dL Casts: NS (Not Seen)    Nitrites: Neg Trichomonas: Not Present    Leukocyte Esterase: Neg leu/uL Mucous: Present      Epithelial Cells: 0 - 5/hpf      Yeast: NS (Not Seen)      Sperm: Not Present    ASSESSMENT:      ICD-10 Details  1 GU:   Gross hematuria - R31.0 Acute, Complicated Injury, Improving  2   Urinary Frequency - R35.0 Chronic, Stable  3   Flank Pain - R10.84 Chronic, Stable  4   History of kidney cancer - Z85.528 Chronic, Stable   PLAN:           Orders Labs Urine Culture          Schedule Return Visit/Planned Activity: Keep Scheduled Appointment - Follow up MD, Schedule Surgery  Document Letter(s):  Created for Patient: Clinical Summary         Notes:   All questions answered to the best of my ability regarding the upcoming procedure and expected postoperative course with understanding expressed by the patient. Urine culture sent today to serve as a baseline. He will proceed with previously scheduled cystoscopy and right diagnostic ureteroscopy on 10/6 with Dr. Renda.        Next Appointment:      Next Appointment: 11/24/2023 03:15 PM    Appointment Type: Surgery     Location: [facility] [phone]    Provider: Gretel Renda, M.D.    Reason for Visit: WL/OP CYSTO, (R) STENT REMOVAL, (R) RPG, (R) DIAG URS, POSS BX WASHING FOR CYTO      * Signed by Ubaldo Eagles, NP on 11/18/23 at 11:34 AM (EDT)*

## 2023-11-24 ENCOUNTER — Other Ambulatory Visit: Payer: Self-pay

## 2023-11-24 ENCOUNTER — Ambulatory Visit (HOSPITAL_COMMUNITY)
Admission: RE | Admit: 2023-11-24 | Discharge: 2023-11-24 | Disposition: A | Discharge status: Home / Self Care | Attending: Urology | Admitting: Urology

## 2023-11-24 ENCOUNTER — Encounter (HOSPITAL_COMMUNITY)
Admission: RE | Disposition: A | Discharge status: Home / Self Care | Payer: Self-pay | Source: Home / Self Care | Attending: Urology

## 2023-11-24 ENCOUNTER — Ambulatory Visit (HOSPITAL_BASED_OUTPATIENT_CLINIC_OR_DEPARTMENT_OTHER): Admitting: Anesthesiology

## 2023-11-24 ENCOUNTER — Ambulatory Visit (HOSPITAL_COMMUNITY): Payer: Self-pay | Admitting: Physician Assistant

## 2023-11-24 ENCOUNTER — Ambulatory Visit (HOSPITAL_COMMUNITY)

## 2023-11-24 ENCOUNTER — Encounter (HOSPITAL_COMMUNITY): Payer: Self-pay | Admitting: Urology

## 2023-11-24 DIAGNOSIS — I129 Hypertensive chronic kidney disease with stage 1 through stage 4 chronic kidney disease, or unspecified chronic kidney disease: Secondary | ICD-10-CM

## 2023-11-24 DIAGNOSIS — I2510 Atherosclerotic heart disease of native coronary artery without angina pectoris: Secondary | ICD-10-CM | POA: Diagnosis not present

## 2023-11-24 DIAGNOSIS — Z79899 Other long term (current) drug therapy: Secondary | ICD-10-CM | POA: Diagnosis not present

## 2023-11-24 DIAGNOSIS — Z794 Long term (current) use of insulin: Secondary | ICD-10-CM | POA: Diagnosis not present

## 2023-11-24 DIAGNOSIS — K219 Gastro-esophageal reflux disease without esophagitis: Secondary | ICD-10-CM | POA: Insufficient documentation

## 2023-11-24 DIAGNOSIS — G4730 Sleep apnea, unspecified: Secondary | ICD-10-CM | POA: Diagnosis not present

## 2023-11-24 DIAGNOSIS — Z7984 Long term (current) use of oral hypoglycemic drugs: Secondary | ICD-10-CM | POA: Diagnosis not present

## 2023-11-24 DIAGNOSIS — R310 Gross hematuria: Secondary | ICD-10-CM | POA: Insufficient documentation

## 2023-11-24 DIAGNOSIS — N1330 Unspecified hydronephrosis: Secondary | ICD-10-CM | POA: Diagnosis not present

## 2023-11-24 DIAGNOSIS — J449 Chronic obstructive pulmonary disease, unspecified: Secondary | ICD-10-CM | POA: Insufficient documentation

## 2023-11-24 DIAGNOSIS — N1832 Chronic kidney disease, stage 3b: Secondary | ICD-10-CM

## 2023-11-24 DIAGNOSIS — F419 Anxiety disorder, unspecified: Secondary | ICD-10-CM | POA: Diagnosis not present

## 2023-11-24 DIAGNOSIS — Z01818 Encounter for other preprocedural examination: Secondary | ICD-10-CM

## 2023-11-24 DIAGNOSIS — E119 Type 2 diabetes mellitus without complications: Secondary | ICD-10-CM | POA: Diagnosis not present

## 2023-11-24 DIAGNOSIS — I10 Essential (primary) hypertension: Secondary | ICD-10-CM | POA: Insufficient documentation

## 2023-11-24 DIAGNOSIS — Z85528 Personal history of other malignant neoplasm of kidney: Secondary | ICD-10-CM | POA: Diagnosis present

## 2023-11-24 HISTORY — PX: CYSTOSCOPY W/ RETROGRADES: SHX1426

## 2023-11-24 HISTORY — PX: CYSTOSCOPY WITH BIOPSY: SHX5122

## 2023-11-24 LAB — CBC
HCT: 44.8 % (ref 39.0–52.0)
Hemoglobin: 14.7 g/dL (ref 13.0–17.0)
MCH: 25.8 pg — ABNORMAL LOW (ref 26.0–34.0)
MCHC: 32.8 g/dL (ref 30.0–36.0)
MCV: 78.6 fL — ABNORMAL LOW (ref 80.0–100.0)
Platelets: 239 K/uL (ref 150–400)
RBC: 5.7 MIL/uL (ref 4.22–5.81)
RDW: 14.3 % (ref 11.5–15.5)
WBC: 7.1 K/uL (ref 4.0–10.5)
nRBC: 0 % (ref 0.0–0.2)

## 2023-11-24 LAB — BASIC METABOLIC PANEL WITH GFR
Anion gap: 14 (ref 5–15)
BUN: 27 mg/dL — ABNORMAL HIGH (ref 8–23)
CO2: 24 mmol/L (ref 22–32)
Calcium: 10.6 mg/dL — ABNORMAL HIGH (ref 8.9–10.3)
Chloride: 102 mmol/L (ref 98–111)
Creatinine, Ser: 1.79 mg/dL — ABNORMAL HIGH (ref 0.61–1.24)
GFR, Estimated: 41 mL/min — ABNORMAL LOW (ref >60)
Glucose, Bld: 212 mg/dL — ABNORMAL HIGH (ref 70–99)
Potassium: 4.4 mmol/L (ref 3.5–5.1)
Sodium: 141 mmol/L (ref 135–145)

## 2023-11-24 LAB — GLUCOSE, CAPILLARY
Glucose-Capillary: 210 mg/dL — ABNORMAL HIGH (ref 70–99)
Glucose-Capillary: 165 mg/dL — ABNORMAL HIGH (ref 70–99)

## 2023-11-24 SURGERY — CYSTOSCOPY, WITH BIOPSY
Anesthesia: General | Laterality: Right

## 2023-11-24 MED ORDER — SODIUM CHLORIDE 0.9 % IR SOLN
Status: DC | PRN
  Administered 2023-11-24: 3000 mL via INTRAVESICAL

## 2023-11-24 MED ORDER — ONDANSETRON HCL 4 MG/2ML IJ SOLN
INTRAMUSCULAR | Status: DC | PRN
  Administered 2023-11-24: 4 mg via INTRAVENOUS

## 2023-11-24 MED ORDER — 0.9 % SODIUM CHLORIDE (POUR BTL) OPTIME
TOPICAL | Status: DC | PRN
  Administered 2023-11-24: 1000 mL

## 2023-11-24 MED ORDER — CHLORHEXIDINE GLUCONATE 0.12 % MT SOLN
15.0000 mL | Freq: Once | OROMUCOSAL | Status: AC
  Administered 2023-11-24: 15 mL via OROMUCOSAL

## 2023-11-24 MED ORDER — FENTANYL CITRATE (PF) 100 MCG/2ML IJ SOLN
INTRAMUSCULAR | Status: AC
  Filled 2023-11-24: qty 2

## 2023-11-24 MED ORDER — FENTANYL CITRATE PF 50 MCG/ML IJ SOSY
25.0000 ug | PREFILLED_SYRINGE | INTRAMUSCULAR | Status: DC | PRN
  Administered 2023-11-24: 25 ug via INTRAVENOUS

## 2023-11-24 MED ORDER — MIDAZOLAM HCL 2 MG/2ML IJ SOLN
INTRAMUSCULAR | Status: AC
  Filled 2023-11-24: qty 2

## 2023-11-24 MED ORDER — ORAL CARE MOUTH RINSE: 15.0000 mL | Freq: Once | OROMUCOSAL | Status: AC

## 2023-11-24 MED ORDER — MIDAZOLAM HCL 5 MG/5ML IJ SOLN
INTRAMUSCULAR | Status: DC | PRN
  Administered 2023-11-24: 2 mg via INTRAVENOUS

## 2023-11-24 MED ORDER — ONDANSETRON HCL 4 MG/2ML IJ SOLN
INTRAMUSCULAR | Status: AC
  Filled 2023-11-24: qty 2

## 2023-11-24 MED ORDER — LACTATED RINGERS IV SOLN
INTRAVENOUS | Status: DC
Start: 2023-11-24 — End: 2023-11-24

## 2023-11-24 MED ORDER — PROPOFOL 10 MG/ML IV BOLUS
INTRAVENOUS | Status: AC
  Filled 2023-11-24: qty 20

## 2023-11-24 MED ORDER — LIDOCAINE HCL (PF) 2 % IJ SOLN
INTRAMUSCULAR | Status: DC | PRN
  Administered 2023-11-24: 25 mg via INTRADERMAL

## 2023-11-24 MED ORDER — ACETAMINOPHEN 500 MG PO TABS
1000.0000 mg | ORAL_TABLET | Freq: Once | ORAL | Status: AC
  Administered 2023-11-24: 1000 mg via ORAL
  Filled 2023-11-24: qty 2

## 2023-11-24 MED ORDER — FENTANYL CITRATE (PF) 100 MCG/2ML IJ SOLN
INTRAMUSCULAR | Status: DC | PRN
  Administered 2023-11-24 (×4): 50 ug via INTRAVENOUS

## 2023-11-24 MED ORDER — DEXAMETHASONE SODIUM PHOSPHATE 10 MG/ML IJ SOLN
INTRAMUSCULAR | Status: AC
  Filled 2023-11-24: qty 1

## 2023-11-24 MED ORDER — FENTANYL CITRATE PF 50 MCG/ML IJ SOSY
PREFILLED_SYRINGE | INTRAMUSCULAR | Status: AC
  Filled 2023-11-24: qty 1

## 2023-11-24 MED ORDER — MEPERIDINE HCL 100 MG/ML IJ SOLN: 6.2500 mg | INTRAMUSCULAR | Status: DC | PRN

## 2023-11-24 MED ORDER — PROPOFOL 10 MG/ML IV BOLUS
INTRAVENOUS | Status: DC | PRN
  Administered 2023-11-24: 130 mg via INTRAVENOUS

## 2023-11-24 MED ORDER — OXYCODONE HCL 5 MG PO TABS: 5.0000 mg | ORAL_TABLET | Freq: Once | ORAL | Status: DC | PRN

## 2023-11-24 MED ORDER — SODIUM CHLORIDE 0.9 % IV SOLN: INTRAVENOUS | Status: DC

## 2023-11-24 MED ORDER — LIDOCAINE HCL (PF) 2 % IJ SOLN
INTRAMUSCULAR | Status: AC
Start: 2023-11-24 — End: 2023-11-24
  Filled 2023-11-24: qty 5

## 2023-11-24 MED ORDER — OXYCODONE HCL 5 MG/5ML PO SOLN: 5.0000 mg | Freq: Once | ORAL | Status: DC | PRN

## 2023-11-24 MED ORDER — IOHEXOL 300 MG/ML  SOLN
INTRAMUSCULAR | Status: DC | PRN
  Administered 2023-11-24: 10 mL

## 2023-11-24 MED ORDER — PROPOFOL 10 MG/ML IV BOLUS
INTRAVENOUS | Status: AC
Start: 2023-11-24 — End: 2023-11-24
  Filled 2023-11-24: qty 20

## 2023-11-24 MED ORDER — MIDAZOLAM HCL 2 MG/2ML IJ SOLN: 0.5000 mg | Freq: Once | INTRAMUSCULAR | Status: DC | PRN

## 2023-11-24 MED ORDER — DEXAMETHASONE SODIUM PHOSPHATE 10 MG/ML IJ SOLN
INTRAMUSCULAR | Status: DC | PRN
  Administered 2023-11-24: 10 mg via INTRAVENOUS

## 2023-11-24 MED ORDER — PHENAZOPYRIDINE HCL 200 MG PO TABS: 200.0000 mg | ORAL_TABLET | Freq: Three times a day (TID) | ORAL | 1 refills | Status: AC | PRN

## 2023-11-24 MED ORDER — INSULIN ASPART 100 UNIT/ML IJ SOLN
0.0000 [IU] | INTRAMUSCULAR | Status: DC | PRN
  Administered 2023-11-24: 2 [IU] via SUBCUTANEOUS
  Filled 2023-11-24: qty 1

## 2023-11-24 MED ORDER — CEFAZOLIN SODIUM-DEXTROSE 2-4 GM/100ML-% IV SOLN
2.0000 g | INTRAVENOUS | Status: AC
  Administered 2023-11-24: 2 g via INTRAVENOUS
  Filled 2023-11-24: qty 100

## 2023-11-24 SURGICAL SUPPLY — 24 items
BAG URINE DRAIN 2000ML AR STRL (UROLOGICAL SUPPLIES) IMPLANT
BAG URO CATCHER STRL LF (MISCELLANEOUS) ×2 IMPLANT
BASKET STNLS GEMINI 4WIRE 3FR (BASKET) IMPLANT
BRUSH URET BIOPSY 3F (UROLOGICAL SUPPLIES) IMPLANT
CATH URETL OPEN END 6FR 70 (CATHETERS) IMPLANT
CLOTH BEACON ORANGE TIMEOUT ST (SAFETY) ×2 IMPLANT
CNTNR URN SCR LID CUP LEK RST (MISCELLANEOUS) IMPLANT
DRAPE FOOT SWITCH (DRAPES) ×2 IMPLANT
ELECT REM PT RETURN 15FT ADLT (MISCELLANEOUS) ×2 IMPLANT
GLOVE SURG LX STRL 7.5 STRW (GLOVE) ×2 IMPLANT
GOWN STRL REUS W/ TWL XL LVL3 (GOWN DISPOSABLE) ×2 IMPLANT
GUIDEWIRE ANG ZIPWIRE 038X150 (WIRE) IMPLANT
GUIDEWIRE STR DUAL SENSOR (WIRE) ×2 IMPLANT
GUIDEWIRE ZIPWRE .038 STRAIGHT (WIRE) IMPLANT
KIT TURNOVER KIT A (KITS) ×2 IMPLANT
LOOP CUT BIPOLAR 24F LRG (ELECTROSURGICAL) IMPLANT
MANIFOLD NEPTUNE II (INSTRUMENTS) ×2 IMPLANT
NS IRRIG 1000ML POUR BTL (IV SOLUTION) IMPLANT
PACK CYSTO (CUSTOM PROCEDURE TRAY) ×2 IMPLANT
SHEATH NAVIGATOR HD 12/14X36 (SHEATH) IMPLANT
STENT URET 6FRX26 CONTOUR (STENTS) IMPLANT
SYRINGE TOOMEY IRRIG 70ML (MISCELLANEOUS) IMPLANT
TUBING CONNECTING 10 (TUBING) ×2 IMPLANT
TUBING UROLOGY SET (TUBING) ×2 IMPLANT

## 2023-11-24 NOTE — Transfer of Care (Signed)
 Immediate Anesthesia Transfer of Care Note  Patient: Kenneth Osborn  Procedure(s) Performed: CYSTOSCOPY, WITH RIGHT URETEROSCOPY, BLADDER WASHING OF RIGHT RENAL PELVIS, BIOPSY OF RIGHT RENAL PELVIS TUMOR, RIGHT URETERAL STONE (Right) CYSTOSCOPY, WITH RETROGRADE PYELOGRAM (Bilateral)  Patient Location: PACU  Anesthesia Type:General  Level of Consciousness: drowsy and patient cooperative  Airway & Oxygen Therapy: Patient Spontanous Breathing and Patient connected to face mask oxygen  Post-op Assessment: Report given to RN and Post -op Vital signs reviewed and stable  Post vital signs: Reviewed and stable  Last Vitals:  Vitals Value Taken Time  BP    Temp    Pulse 71 11/24/23 15:28  Resp 13 11/24/23 15:28  SpO2 98 % 11/24/23 15:28  Vitals shown include unfiled device data.  Last Pain:  Vitals:   11/24/23 1348  TempSrc:   PainSc: 3       Patients Stated Pain Goal: 2 (11/24/23 1348)  Complications: No notable events documented.

## 2023-11-24 NOTE — Op Note (Signed)
 Preoperative diagnoses: 1.  Hematuria 2.  Right hydronephrosis and solitary right kidney  Postoperative diagnoses: 1.  Hematuria 2.  Right hydronephrosis and solitary right kidney  Procedures: 1.  Cystoscopy 2.  Bilateral retrograde pyelography with interpretation 3.  Right ureteroscopy 4.  Right renal pelvic washing for cytology 5.  Brush biopsy of right renal pelvic tumor 6.  Biopsy of right renal pelvic tumor 7.  Right ureteral stent (6 x 26-no string)  Surgeon: Gretel CANDIE Renda Mickey MD  Anesthesia: General  Complications: None  EBL: Minimal  Specimens: 1.  Right renal pelvic washing 2.  Brush biopsy of right renal pelvic tumor 3.  Biopsies of right renal pelvic tumor  Disposition of specimen: Pathology  Intraoperative findings: Right retrograde pyelography demonstrated a filling defect in the upper pole calyx without other renal pelvic or ureteral abnormalities.  Retrograde pyelogram of the left ureteral stump revealed no filling defects or abnormalities.  Indication: Kenneth Osborn is a 68 year old gentleman who is status post left nephrectomy approximately 3 years ago for renal cell carcinoma.  He developed the acute onset of gross hematuria this summer and was noted to have an acute kidney injury with hydronephrosis of his solitary right kidney.  He underwent evaluation at [facility] and a ureteral stent was placed by Dr. Glendia Barba.  His renal function subsequently resolved.  He returns today for further evaluation of his hematuria.  The potential risks, complications, and expected recovery process associated with the above procedures were discussed in detail.  Informed consent was obtained.  Description of procedure: The patient was taken to the operating room and a general anesthetic was administered.  He was given preoperative antibiotics, placed in the dorsolithotomy position, and prepped and draped in usual sterile fashion.  Next, preoperative timeout was  performed.  Cystourethroscopy was performed in the indwelling right ureteral stent was immediately identified.  This was brought out to the urethral meatus with flexible graspers and removed.  Utilizing a 6 French ureteral catheter, the right ureteral orifice was intubated and Omnipaque  contrast was injected.  This revealed a normal caliber ureter without filling defects.  The right renal pelvis was mildly dilated with a filling defect noted in the upper pole calyx without other abnormalities.  A 0.38 sensor guidewire was then advanced up into the renal pelvis.  A 12/14 ureteral access sheath was then advanced over the wire and positioned in the proximal ureter.  A digital flexible ureteroscope was then advanced into the renal collecting system which was systematically examined.  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  A right renal pelvic washing was obtained for cytology.  A brush biopsy was then obtained from this area.  Finally, multiple biopsies were obtained with a Gemini basket for tissue diagnosis.  With the guidewire left in place, the ureteroscope was removed and the access sheath was withdrawn.  The wire was then backloaded on the cystoscope and a 6 x 26 double-J ureteral stent was advanced over the wire using Seldinger technique and position appropriately under fluoroscopic and cystoscopic guidance.  The wire was removed with a good curl noted in the renal pelvis as well as within the bladder.  Attention then turned to the left ureteral stump.  Considering the possible diagnosis of urothelial carcinoma, contrast was injected via a 6 [place] ureteral catheter into the left ureteral stump which appeared normal without filling defects or abnormalities.  Reinspection of the bladder revealed no bladder  tumors.  The patient's bladder was emptied and the procedure was ended.  He tolerated the procedure well without complications.  He was  able to be awakened and transferred to the recovery unit in satisfactory condition.

## 2023-11-24 NOTE — Discharge Instructions (Addendum)
You may see some blood in the urine and may have some burning with urination for 48-72 hours. You also may notice that you have to urinate more frequently or urgently after your procedure which is normal.  You should call should you develop an inability urinate, fever > 101, persistent nausea and vomiting that prevents you from eating or drinking to stay hydrated.  If you have a stent, you will likely urinate more frequently and urgently until the stent is removed and you may experience some discomfort/pain in the lower abdomen and flank especially when urinating. You may take pain medication prescribed to you if needed for pain. You may also intermittently have blood in the urine until the stent is removed.

## 2023-11-24 NOTE — Interval H&P Note (Signed)
 History and Physical Interval Note:  11/24/2023 1:57 PM  Kenneth Osborn  has presented today for surgery, with the diagnosis of GROSS HEMATURIA, HISTORY OF KIDNEY CANCER.  The various methods of treatment have been discussed with the patient and family. After consideration of risks, benefits and other options for treatment, the patient has consented to  Procedure(s): CYSTOSCOPY, WITH BIOPSY (Right) CYSTOSCOPY, WITH RETROGRADE PYELOGRAM (Right) as a surgical intervention.  The patient's history has been reviewed, patient examined, no change in status, stable for surgery.  I have reviewed the patient's chart and labs.  Questions were answered to the patient's satisfaction.     [employer]

## 2023-11-24 NOTE — Anesthesia Postprocedure Evaluation (Signed)
 Anesthesia Post Note  Patient: Kenneth Osborn  Procedure(s) Performed: CYSTOSCOPY, WITH RIGHT URETEROSCOPY, WASHING OF RIGHT RENAL PELVIS, BRUSH BIOPSY OF RIGHT RENAL PELVIS TUMOR, BIOPSY OF RIGHT RENAL PELVIC TUMOR, RIGHT URETERAL STENT EXCHANGE (Right) CYSTOSCOPY, WITH RETROGRADE PYELOGRAM (Bilateral)     Patient location during evaluation: PACU Anesthesia Type: General Level of consciousness: awake and alert, oriented and patient cooperative Pain management: pain level controlled Vital Signs Assessment: post-procedure vital signs reviewed and stable Respiratory status: spontaneous breathing, nonlabored ventilation and respiratory function stable Cardiovascular status: blood pressure returned to baseline and stable Postop Assessment: no apparent nausea or vomiting Anesthetic complications: no   No notable events documented.  Last Vitals:  Vitals:   11/24/23 1600 11/24/23 1618  BP: (!) 124/55 (!) 147/79  Pulse: 67 63  Resp: 10 14  Temp:    SpO2: 95% 96%    Last Pain:  Vitals:   11/24/23 1618  TempSrc:   PainSc: 4                  Robbert Langlinais,E. Bernadean Saling

## 2023-11-24 NOTE — Anesthesia Procedure Notes (Signed)
 Procedure Name: LMA Insertion Date/Time: 11/24/2023 2:27 PM  Performed by: Memory Armida LABOR, CRNAPre-anesthesia Checklist: Patient identified, Emergency Drugs available, Suction available, Patient being monitored and Timeout performed Patient Re-evaluated:Patient Re-evaluated prior to induction Oxygen Delivery Method: Simple face mask Preoxygenation: Pre-oxygenation with 100% oxygen Induction Type: IV induction LMA: LMA with gastric port inserted LMA Size: 4.0 Placement Confirmation: positive ETCO2 and breath sounds checked- equal and bilateral Tube secured with: Tape Dental Injury: Teeth and Oropharynx as per pre-operative assessment

## 2023-11-25 ENCOUNTER — Encounter (HOSPITAL_COMMUNITY): Payer: Self-pay | Admitting: Urology

## 2023-11-27 LAB — SURGICAL PATHOLOGY

## 2023-11-28 LAB — CYTOLOGY - NON PAP

## 2023-12-10 NOTE — Progress Notes (Signed)
 [facility] Follow Up Visit   Patient Name: Kenneth Osborn, male   Patient DOB: 09-01-1955 Date of Service: 12/10/2023  Patient MRN: [id number] Provider Creating Note: Woodward Brought, MD  [phone] Primary Care Physician: Valora Lynwood FALCON, MD   [address] [place] [address] Additional Physicians/ Providers:   Impression/Recommendations   [name] is a 68 y.o. white male with anxiety, hypertension, coronary artery disease, carotid artery stenosis, diabetes mellitus type II, diabetic neuropathy, GERD, meniere's, sleep apnea, tremor, chronic pain, history of renal cell carcinoma who presents for follow up for chronic kidney disease stage IIIB with hyperkalemia: Creatinine 1.8, GFR of 40. Potassium 4.7.   Chronic kidney disease stage IIIB with hyperkalemia and proteinuria. With solitary kidney. If remaining kidney is removed, patient will need renal replacement therapy. Discussed different modes of dialysis. Discussed benefits of doing dialysis at home. Patient is apprehensive about dialysis.  - Continue empagliflozin 25mg  daily  - avoid ACE-I/ARB due to hyperkalemia. 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  - Avoid mineralocorticoid receptor antagonists due to hyperkalemia.  - currently holding diuretics  - low potassium diet reviewed.   - urology for follow up.   - refer to oncology.  - schedule Kidney Education Class.   Hypertension with chronic kidney disease: 130/77.  - Current regimen of diltiazem , isosorbide  mononitrate, metoprolol  and tamsulosin .  - home blood pressure monitoring.   Secondary Hyperparathyroidism: with history of hypercalcemia.   - not on a vitamin D agent.   Hyperlipidemia: not at goal.  - rosuvastatin   Diabetes mellitus type II with chronic kidney disease: insulin  dependent. Hemoglobin A1c of 7.5% on 10/22/23. followed by endocrinology, Dr. Cherilyn - Continue empagliflozin 25mg  daily   - Continue  metformin   Patient Active Problem List  Diagnosis  . Hyperkalemia  . Type 2 diabetes mellitus with diabetic chronic kidney disease (HCC)  . Renal cell carcinoma <Left side> (HCC)  . Anemia in chronic kidney disease  . Chronic kidney disease stage 3B (HCC)  . Secondary hyperparathyroidism of renal origin (HCC)  . Hypercalcemia  . Hypotension  . Proteinuria  . Hypertensive chronic kidney disease, malignant, with chronic kidney disease stage I through stage IV, or unspecified  . Urinary tract obstruction  . Acute kidney failure (HCC)    Orders Placed This Encounter  . PTH, Intact  . Renal Function Panel  . CBC and Differential  . Ambulatory referral to Hematology / Oncology  . Ambulatory referral to Medical Records       Return in about 1 week (around 12/17/2023).  Chief Complaint   Chief Complaint  Patient presents with  . Follow-up    History of Present Illness  [name] presents for follow up for chronic kidney disease stage IIIB with hyperkalemia. Patient presents with his wife.   Patient was seen by urology yesterday. He was found to have a lesion on his right kidney. He already had a left nephrectomy.   Patient was given the option of palliative care with conservative management versus nephrectomy. If patient has his kidney removed, we will need renal replacement therapy.   Patient has many questions and concerns about dialysis.   He states his blood pressure is well controlled.   Patient continues to complain of urinary incontinence. Patient denies any gross hematuria.    Denies use of nonsteroidal anti-inflammatory agent.   Medications   Current Outpatient Medications:  .  ALPRAZolam  (XANAX ) 0.5 MG tablet, Take 1 tablet (0.5  mg total) by mouth at bedtime as needed for Sleep, Disp: , Rfl:  .  cetirizine (ZyrTEC) 10 MG tablet, Take 1 tablet (10 mg total) by mouth once daily, Disp: , Rfl:  .  cyclobenzaprine  (FLEXERIL ) 10 MG tablet, Take 1 tablet (10  mg total) by mouth 3 (three) times daily as needed, Disp: , Rfl:  .  diazePAM  (VALIUM ) 5 MG tablet, Take 1 tablet (5 mg total) by mouth every 8 (eight) hours as needed (Menieres), Disp: , Rfl:  .  dilTIAZem  (TIAZAC ) 240 MG 24 hr capsule, TAKE 1 CAPSULE BY MOUTH  ONCE DAILY, Disp: , Rfl:  .  Docusate Sodium  (DSS) 100 MG capsule, Take 100 mg by mouth 2 (two) times daily, Disp: , Rfl:  .  Empagliflozin (Jardiance) 25 MG tablet, Take 25 mg by mouth 1 (one) time each day in the morning, Disp: 30 tablet, Rfl: 11 .  esomeprazole (NexIUM) 40 MG DR capsule, Take 1 capsule (40 mg total) by mouth 2 (two) times daily, Disp: , Rfl:  .  ezetimibe  (ZETIA ) 10 MG tablet, TAKE 1 TABLET BY MOUTH ONCE DAILY, Disp: , Rfl:  .  gabapentin  (NEURONTIN ) 800 MG tablet, Take 800 mg by mouth in the morning and 800 mg in the evening., Disp: , Rfl:  .  insulin  regular (HumuLIN  R) 500 UNIT/ML CONCENTRATED injection, Patient taking differently: 90 UNITS WITH BREAKFAST.  Up to 300 units on sliding scale if needed, Disp: , Rfl:  .  isosorbide  mononitrate (IMDUR ) 30 MG 24 hr tablet, Take 1 tablet (30 mg total) by mouth in the morning. Do not crush or chew., Disp: 30 tablet, Rfl: 11 .  latanoprost  (XALATAN ) 0.005 % ophthalmic solution, 1 drop every night, Disp: , Rfl:  .  lubiprostone  (AMITIZA ) 24 MCG capsule, Take 24 mcg by mouth in the morning and 24 mcg in the evening. Take with meals., Disp: , Rfl:  .  Magnesium  Oxide 500 MG tablet, Take 500 mg by mouth once daily, Disp: , Rfl:  .  meclizine  (ANTIVERT ) 25 MG tablet, Take 1 tablet (25 mg total) by mouth 3 (three) times daily as needed for Dizziness, Disp: , Rfl:  .  metFORMIN  (GLUCOPHAGE ) 1000 MG tablet, TAKE 1 TABLET BY MOUTH  TWICE DAILY WITH MEALS, Disp: , Rfl:  .  metoprolol  succinate XL (TOPROL -XL) 100 MG 24 hr tablet, TAKE 1 TABLET BY MOUTH  TWICE DAILY, Disp: , Rfl:  .  montelukast  (SINGULAIR ) 10 MG tablet, Take 1 tablet (10 mg total) by mouth at bedtime, Disp: , Rfl:  .   Multiple Vitamin (multivitamin) tablet, Take 1 tablet by mouth 1 (one) time each day, Disp: , Rfl:  .  niacin  (NIASPAN ) 500 MG CR tablet, Take 1 tablet (500 mg total) by mouth at bedtime, Disp: , Rfl:  .  nitroglycerin  (NITROSTAT ) 0.4 MG SL tablet, DISSOLVE 1 TABLET UNDER THE TONGUE EVERY 5 MINUTES AS  NEEDED FOR CHEST PAIN. MAX  OF 3 TABLETS IN 15 MINUTES. CALL 911 IF PAIN PERSISTS., Disp: , Rfl:  .  OMEGA-3 FATTY ACIDS PO, Take 1,200 mg by mouth 2 (two) times daily., Disp: , Rfl:  .  oxybutynin  (DITROPAN ) 5 MG tablet, Take 5 mg by mouth 3 (three) times a day if needed, Disp: , Rfl:  .  oxyCODONE  (OXY-IR) 5 MG immediate release capsule, every 6 (six) hours if needed for moderate pain or severe pain, Disp: , Rfl:  .  prasugrel  (EFFIENT ) 10 MG tablet, TAKE 1 TABLET BY MOUTH ONCE  DAILY, Disp: , Rfl:  .  rosuvastatin  (CRESTOR ) 40 MG tablet, TAKE 1 TABLET BY MOUTH AT  NIGHT, Disp: , Rfl:  .  tamsulosin  (FLOMAX ) 0.4 MG 24 hr capsule, Take 0.4 mg by mouth 1 (one) time each day, Disp: , Rfl:    Allergies Patient has no known allergies.  History Past Medical History:  Diagnosis Date  . Acute kidney failure (HCC) 09/30/2023  . Anxiety   . Barrett's esophagus   . Carotid artery stenosis   . Chronic kidney disease stage 3B (HCC) 12/25/2020  . Coronary atherosclerosis of unspecified type of vessel, native or graft   . Diabetic neuropathy with neurologic complication (HCC)   . Gastroesophageal reflux disease   . Hypercalcemia 04/12/2021  . Hyperkalemia   . Hyperlipidemia   . Hypertensive chronic kidney disease, malignant, with chronic kidney disease stage I through stage IV, or unspecified 06/04/2022  . Lung nodule   . Minimal cognitive impairment   . Mnire's disease   . Proteinuria 06/04/2022  . Renal cell carcinoma <Left side> (HCC)   . Seasonal allergic rhinitis   . Secondary hyperparathyroidism of renal origin (HCC) 02/07/2021  . Sleep apnea   . Tremor   . Type 2 diabetes mellitus with  diabetic chronic kidney disease (HCC)   . Urinary tract obstruction 09/30/2023    Past Surgical History:  Procedure Laterality Date  . APPENDECTOMY    . BACK SURGERY    . CHOLECYSTECTOMY    . COLONOSCOPY     12/22/1998,03/31/2003,06/06/2008,09/19/2014  . CORONARY ANGIOPLASTY    . ESOPHAGOGASTRODUODENOSCOPY     01/26/1999,06/06/2008,06/04/2010  . MASTOID SURGERY Left   . NEPHRECTOMY Left 04/20/2020   Dr. Renda  . SPINAL CORD STIMULATOR IMPLANT     Family History  Problem Relation Age of Onset  . Hypertension Mother   . Diabetes Mother   . Cancer Mother        Breast  . Heart disease Father   . Hypertension Father   . Hypertension Sister   . Cancer Sister        Breast  . Hypertension Brother   . Diabetes Maternal Grandmother   . Diabetes Paternal Grandmother   . Kidney disease Paternal Grandfather   . Hypertension Paternal Grandfather   . Diabetes Paternal Grandfather    Social History   Tobacco Use  . Smoking status: Never  . Smokeless tobacco: Never  Substance Use Topics  . Alcohol use: Never     Physical Exam  Vitals BP 128/73 (BP Location: Left upper arm, Patient Position: Standing)   Pulse 67   Temp 97.7 F   Wt 184 lb (83.5 kg)   SpO2 96%   BMI 27.17 kg/m   Constitutional: He is oriented to person, place, and time.  HEENT:  Right Ear: Hearing normal.  Left Ear: Hearing normal.  Nose: Nose normal. Mouth/Throat: Oropharynx is clear and moist.  Eyes: Conjunctivae and EOM are normal. Pupils are equal, round, and reactive to light.  Cardiovascular:  Normal rate, regular rhythm and intact distal pulses.           Murmur heard. Pulmonary/Chest: Effort normal and breath sounds normal.  Abdominal: Soft. Bowel sounds are normal.  Neurological: He is alert and oriented to person, place, and time.  Skin: Skin is warm and dry.  Psychiatric: He has a normal mood and affect. His behavior is normal. Judgment normal.   Urine microscopy: TNTC red cells. Red on  appearance.    Laboratory Studies  Chemistry  Lab Units 10/22/23 0837 09/11/23 1014 08/26/23 1350 06/19/23 0802 01/22/23 1418 01/22/23 0941 12/24/22 1350 11/13/22 1056 10/01/22 0936 08/14/22 0823 06/20/22 0850 05/21/22 0902 05/01/22 1423 03/04/22 1107 01/16/22 1154 01/16/22 1154  SODIUM mmol/L 139 142 142  --  140  --  136 142  --  139 140  --  138 140   < > 139  POTASSIUM mmol/L 4.7 4.5 5.6*  --  4.5  --  6.3* 4.9  --  4.9 4.8  --  5.7* 5.5*   < > 5.7*  CHLORIDE mmol/L 104 106 105  --  101  --  101 105  --  106 105  --  103 107   < > 105  CO2 mmol/L 24.7 25.0 24  --  27.0  --  23.6 24  --  25.8 26  --  26.2 25   < > 26.3  CALCIUM  mg/dL 9.7 9.6 9.9  --  89.7  --  10.0 10.8*  --  9.5 9.6  --  9.9 10.3   < > 9.9  PHOSPHORUS mg/dL  --  3.1  --   --   --   --   --  3.7  --   --  4.0  --  2.8 3.4  --   --   ALK PHOS U/L 104  --  92  --  105*  --  92  --   --  81  --   --   --   --   --  83  PTH pg/mL  --   --  160*  --   --   --   --  63  --   --   --   --   --  118*  --   --   GLUCOSE mg/dL 826* 747* 683*  --  806*  --  330* 333*  --  216* 149*  --  255* 189*   < > 81  ALBUMIN  g/dL 4.2 4.0 4.3  --  4.5  --  4.5 4.4  --  4.3 4.1  --  4.6 4.4   < > 4.5  BUN mg/dL 28* 35* 26*  --  34*  --  55* 46*  --  42* 35*  --  34* 32*   < > 31*  CREATININE mg/dL 1.8* 2.1* 8.11*  --  2.0*  --  2.9* 1.93*  --  2.0* 1.95*  --  1.8* 1.80*   < > 1.8*  HEMOGLOBIN A1C % 7.5*  --   --  7.0*  --  7.9*  --   --  8.5*  --   --  8.2*  --   --   --   --    < > = values in this interval not displayed.        No lab exists for component: IRON SATURATION, TRANSSATPER  CBC  Lab Units 08/26/23 1350 11/25/22 1021 11/13/22 1056 03/04/22 1107  WBC AUTO Thousand/uL 8.8  --  8.0  --   HEMOGLOBIN g/dL 84.5  --  84.6  --   HEMOGLOBIN URINE   --  NEGATIVE  --  NEGATIVE  HEMATOCRIT % 48.5  --  48.2  --   MCV fL 83.9  --  88.9  --   PLATELETS AUTO Thousand/uL 244  --  238  --     Urine  Lab Units  08/26/23 1315 11/25/22 1021 03/04/22 1107  COLOR U   --  YELLOW YELLOW  COLOR UA  Red  --   --   CLARITY UA  Turbid  --   --   KETONES U MG/DL   --  NEGATIVE NEGATIVE  KETONES UA  Positive  --   --   PH UA  300.0  --   --   UROBILINOGEN UA  >=8.0  --   --   PROT/CREAT RATIO UR mg/g creat  --  0.242*  242* 0.275*  275*        No lab exists for component: CYCLOSPORITR     Woodward Brought, MD  [address], [place]

## 2023-12-15 ENCOUNTER — Encounter: Payer: Self-pay | Admitting: Oncology

## 2023-12-15 ENCOUNTER — Inpatient Hospital Stay

## 2023-12-15 ENCOUNTER — Inpatient Hospital Stay: Attending: Oncology | Admitting: Oncology

## 2023-12-15 VITALS — BP 119/73 | HR 65 | Temp 97.6°F | Resp 19 | Ht 69.0 in | Wt 193.1 lb

## 2023-12-15 DIAGNOSIS — E119 Type 2 diabetes mellitus without complications: Secondary | ICD-10-CM | POA: Insufficient documentation

## 2023-12-15 DIAGNOSIS — Z794 Long term (current) use of insulin: Secondary | ICD-10-CM | POA: Insufficient documentation

## 2023-12-15 DIAGNOSIS — I120 Hypertensive chronic kidney disease with stage 5 chronic kidney disease or end stage renal disease: Secondary | ICD-10-CM | POA: Insufficient documentation

## 2023-12-15 DIAGNOSIS — F1120 Opioid dependence, uncomplicated: Secondary | ICD-10-CM | POA: Diagnosis not present

## 2023-12-15 DIAGNOSIS — E7800 Pure hypercholesterolemia, unspecified: Secondary | ICD-10-CM | POA: Insufficient documentation

## 2023-12-15 DIAGNOSIS — N1832 Chronic kidney disease, stage 3b: Secondary | ICD-10-CM | POA: Insufficient documentation

## 2023-12-15 DIAGNOSIS — E1122 Type 2 diabetes mellitus with diabetic chronic kidney disease: Secondary | ICD-10-CM | POA: Diagnosis not present

## 2023-12-15 DIAGNOSIS — N2581 Secondary hyperparathyroidism of renal origin: Secondary | ICD-10-CM | POA: Diagnosis not present

## 2023-12-15 DIAGNOSIS — C641 Malignant neoplasm of right kidney, except renal pelvis: Secondary | ICD-10-CM | POA: Diagnosis present

## 2023-12-15 DIAGNOSIS — I252 Old myocardial infarction: Secondary | ICD-10-CM | POA: Diagnosis not present

## 2023-12-15 DIAGNOSIS — E785 Hyperlipidemia, unspecified: Secondary | ICD-10-CM | POA: Insufficient documentation

## 2023-12-15 DIAGNOSIS — I10 Essential (primary) hypertension: Secondary | ICD-10-CM | POA: Diagnosis not present

## 2023-12-15 DIAGNOSIS — I2510 Atherosclerotic heart disease of native coronary artery without angina pectoris: Secondary | ICD-10-CM | POA: Insufficient documentation

## 2023-12-15 DIAGNOSIS — Z79899 Other long term (current) drug therapy: Secondary | ICD-10-CM | POA: Insufficient documentation

## 2023-12-15 NOTE — Progress Notes (Signed)
 New patient; Urgent Referral : Renal Cell Carcinoma.

## 2023-12-15 NOTE — Progress Notes (Signed)
 Dr. Melanee requested that Dr. Gretel Ferrara contact her directly to further discuss this patient.  Outbound call to [facility] [phone] and requested for Dr. Ferrara to contact Dr. Melanee directly on cell phone.  Representative indicated she would relay the information to Dr. Ferrara.

## 2023-12-17 ENCOUNTER — Ambulatory Visit
Admission: RE | Admit: 2023-12-17 | Discharge: 2023-12-17 | Disposition: A | Discharge status: Home / Self Care | Source: Ambulatory Visit | Attending: Oncology | Admitting: Oncology

## 2023-12-17 DIAGNOSIS — I517 Cardiomegaly: Secondary | ICD-10-CM | POA: Diagnosis not present

## 2023-12-17 DIAGNOSIS — C641 Malignant neoplasm of right kidney, except renal pelvis: Secondary | ICD-10-CM | POA: Diagnosis present

## 2023-12-17 DIAGNOSIS — C679 Malignant neoplasm of bladder, unspecified: Secondary | ICD-10-CM | POA: Diagnosis not present

## 2023-12-17 DIAGNOSIS — I700 Atherosclerosis of aorta: Secondary | ICD-10-CM | POA: Insufficient documentation

## 2023-12-17 DIAGNOSIS — Z905 Acquired absence of kidney: Secondary | ICD-10-CM | POA: Insufficient documentation

## 2023-12-17 DIAGNOSIS — R310 Gross hematuria: Secondary | ICD-10-CM | POA: Diagnosis not present

## 2023-12-17 DIAGNOSIS — I2510 Atherosclerotic heart disease of native coronary artery without angina pectoris: Secondary | ICD-10-CM | POA: Insufficient documentation

## 2023-12-17 LAB — GLUCOSE, CAPILLARY: Glucose-Capillary: 163 mg/dL — ABNORMAL HIGH (ref 70–99)

## 2023-12-17 MED ORDER — FLUDEOXYGLUCOSE F - 18 (FDG) INJECTION
10.0000 | Freq: Once | INTRAVENOUS | Status: AC | PRN
  Administered 2023-12-17: 10.78 via INTRAVENOUS

## 2023-12-18 ENCOUNTER — Inpatient Hospital Stay

## 2023-12-19 ENCOUNTER — Telehealth: Payer: Self-pay

## 2023-12-19 DIAGNOSIS — C642 Malignant neoplasm of left kidney, except renal pelvis: Secondary | ICD-10-CM

## 2023-12-19 NOTE — Telephone Encounter (Signed)
 Per Dr. Melanee Refer patient to radiation oncology.  See me in 2 months no labs.  Dr. Melanee has already touched base with patient earlier today.

## 2023-12-19 NOTE — Telephone Encounter (Signed)
 Patient called to let Dr. Melanee know that he was expecting a call from her yesterday but did not receive.  He would like for MD to call him with an update [phone].

## 2023-12-19 NOTE — Telephone Encounter (Signed)
 Refer patient to radiation oncology.  See me in 2 months no labs

## 2023-12-24 ENCOUNTER — Telehealth: Payer: Self-pay | Admitting: Student in an Organized Health Care Education/Training Program

## 2023-12-24 ENCOUNTER — Telehealth: Payer: Self-pay | Admitting: Pain Medicine

## 2023-12-24 NOTE — Telephone Encounter (Signed)
 PT stated he needs to talk about test result that might effect his surgery. Please call

## 2023-12-24 NOTE — Telephone Encounter (Signed)
 Error

## 2023-12-24 NOTE — Telephone Encounter (Signed)
 Recently diagnosed with kidney cancer, plans to have the kidney removed. Concerned about post op pain meds.  I informed him that his surgeon may prescribe opioids, I will give him a copy of the Surgeon's Letter explaining this.  I placed the letter at the front desk for him to pick up later.

## 2023-12-25 ENCOUNTER — Inpatient Hospital Stay

## 2023-12-28 ENCOUNTER — Encounter: Payer: Self-pay | Admitting: Oncology

## 2023-12-28 DIAGNOSIS — C641 Malignant neoplasm of right kidney, except renal pelvis: Secondary | ICD-10-CM | POA: Insufficient documentation

## 2023-12-28 NOTE — Progress Notes (Signed)
 Hematology/Oncology Consult note [facility] [phone] [phone]  Patient Care Team: Valora Lynwood FALCON, MD as PCP - General (Family Medicine) Sheldon Standing, MD as Consulting Physician (General Surgery) Renda Glance, MD as Consulting Physician (Urology) Cherilyn Debby CROME, MD as Consulting Physician (Endocrinology) Hester Wolm PARAS, MD as Consulting Physician (Cardiology) Melanee Annah BROCKS, MD as Consulting Physician (Oncology) Lenn Aran, MD as Consulting Physician (Radiation Oncology)   Name of the patient: Kenneth Osborn  [id number]  Apr 08, 1955    Reason for referral-new diagnosis of upper urothelial tract cancer   Referring physician-Dr. Douglas  Date of visit: 12/28/23   History of presenting illness- Patient is a 68 year old male with a past medical history significant for hypertension, hyperlipidemia, type 2 diabetes, stage IIIb CKD who has a prior history of renal cell carcinoma papillary subtype for which she has undergone left nephrectomy in 2022 after an attempted partial nephrectomy.  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  Patient had evidence of hematuria noted in July 2025 and underwent CT scan which showed hyperdense material in the right renal collecting systems suspecting hemorrhagic material.  No definite calcified stone.  Patient was then seen by urology and underwent an ultrasound which showed urinary bladder that was decompressed by Foley catheter.  He was subsequently referred to Dr. Renda and underwent right renal pelvic washing and brush biopsy of right renal pelvic tumor which was noted on right retrograde pyelography.  Renal tumor right pelvic biopsy showed low-grade papillary urothelial carcinoma.  However cytology washings from the right pelvis showed high-grade urothelial carcinoma.   Patient has met with Dr. 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  He is therefore here to discuss alternative options if any  ECOG PS- 1  Pain scale- 0   Review of systems- Review of Systems  Constitutional:  Negative for chills, fever, malaise/fatigue and weight loss.  HENT:  Negative for congestion, ear discharge and nosebleeds.   Eyes:  Negative for blurred vision.  Respiratory:  Negative for cough, hemoptysis, sputum production, shortness of breath and wheezing.   Cardiovascular:  Negative for chest pain, palpitations, orthopnea and claudication.  Gastrointestinal:  Negative for abdominal pain, blood in stool, constipation, diarrhea, heartburn, melena, nausea and vomiting.  Genitourinary:  Negative for dysuria, flank pain, frequency, hematuria and urgency.  Musculoskeletal:  Negative for back pain, joint pain and myalgias.  Skin:  Negative for rash.  Neurological:  Negative for dizziness, tingling, focal weakness, seizures, weakness and headaches.  Endo/Heme/Allergies:  Does not bruise/bleed easily.  Psychiatric/Behavioral:  Negative for depression and suicidal ideas. The patient does not have insomnia.     No Known Allergies  Patient Active Problem List   Diagnosis Date Noted   Hematuria 08/26/2023   HTN (hypertension) 08/26/2023   HLD (hyperlipidemia) 08/26/2023   Anxiety 08/26/2023   Overweight (BMI 25.0-29.9) 08/26/2023   Abnormal MRI, lumbar spine (12/24/2013) 10/15/2022   History of coronary artery stent placement 10/02/2022   Lumbar facet joint pain 09/02/2022   Proteinuria 06/04/2022   Hypertensive kidney disease, malignant, stage 1-4 or unspecified chronic kidney disease 06/04/2022   Osteoarthritis of knee (Left) 05/22/2022   Osteoarthritis of patellofemoral joint (Left) 05/22/2022   Baker's cyst (tiny) (Left) 05/22/2022   Acute pain of left knee  05/06/2022   Chronic knee  pain (Left) 05/06/2022   Failure to attend appointment 04/29/2022   Long term prescription benzodiazepine use 01/02/2022    Class: Chronic   Epidural fibrosis 12/04/2021   Chronic low back pain (Right) w/ sciatica (Right) 11/07/2021   Chronic lower extremity pain (Right) 11/07/2021   Chronic Lumbar radicular pain (Right: S1) 11/07/2021   Hypercalcemia 04/12/2021   Secondary hyperparathyroidism of renal origin 02/07/2021   Anemia in chronic kidney disease 12/25/2020   Malignant neoplasm of left kidney, except renal pelvis (HCC) 12/25/2020   Type 2 diabetes mellitus with diabetic chronic kidney disease (HCC) 12/25/2020   Chronic use of opiate for therapeutic purpose 05/23/2020   Uncomplicated opioid dependence (HCC) 05/23/2020   Constipation 04/24/2020   Low grade fever 04/24/2020   Hypoxia    Status post nephrectomy    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 04/20/2020   Acute on chronic cholecystitis s/p robotic cholecystectomy 04/20/2020 04/20/2020   Elevated LFTs    Renal mass    Hypokalemia    Chronic low back pain (Bilateral) w/o sciatica 12/16/2019   Hyperglycemia 12/16/2019   Pharmacologic therapy 08/03/2019   Disorder of skeletal system 08/03/2019   Problems influencing health status 08/03/2019   Postop check 12/17/2018   DDD (degenerative disc disease), lumbosacral 09/21/2018   Diabetic polyneuropathy associated with diabetes mellitus due to underlying condition (HCC) 09/10/2018   Neuropathic pain 09/10/2018   Musculoskeletal pain 09/10/2018   Acute kidney injury superimposed on chronic kidney disease 02/19/2018   Frequent falls 02/07/2018   Hyperkalemia 01/18/2018   Spondylosis without myelopathy or radiculopathy, lumbosacral region 11/13/2017   Cardiac syncope 09/08/2017   History of coronary artery disease 09/03/2017   Spinal cord stimulator dysfunction 12/26/2016   Trigger point posterior superior  iliac spine (PSIS) (Right) 12/25/2016   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) 12/25/2016   Polyneuropathy 12/18/2016   Numbness and tingling 11/12/2016   Tachycardia with heart rate 100-120 beats per minute 11/12/2016   Meniere disease, bilateral 11/12/2016   Headache disorder 11/12/2016   Type 2 diabetes mellitus with diabetic neuropathy (HCC) 11/12/2016   Type 2 diabetes mellitus with diabetic polyneuropathy, with long-term current use of insulin  (HCC) 11/12/2016   Tremor 09/11/2016   Chronic pain syndrome 05/28/2016   Bilateral carotid artery stenosis 01/24/2016   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) 11/27/2015   Balance problem 10/09/2015   Chronic tension-type headache, not intractable 10/09/2015   Dizziness 10/09/2015   Post-concussion headache 10/09/2015   PSVT (paroxysmal supraventricular tachycardia) 08/31/2015   Diabetes mellitus, type II (HCC) 06/14/2015   Unstable angina (HCC) 06/08/2015   Encounter for interrogation of neurostimulator 03/22/2015   [employer] spinal cord stimulator (implant date: 11/01/2014) 03/22/2015   Type 2 diabetes mellitus with hyperglycemia (HCC) 03/15/2015   Uncontrolled type 2 diabetes mellitus with hyperglycemia, with long-term current use of insulin  (HCC) 03/15/2015   DM type 2 with diabetic peripheral neuropathy (HCC) 03/15/2015   Acid reflux 12/21/2014   OSA (obstructive sleep apnea) 12/21/2014   S/P insertion of spinal cord stimulator 12/21/2014   Chronic low back pain (Bilateral) (L>R) w/ sciatica (Left) 12/21/2014   Failed cervical surgery syndrome (C5-6 ACDF by Dr. Lamar Notch at [facility] on 01/04/2013) 12/21/2014   Long term current use of opiate analgesic 12/21/2014   Long term prescription opiate use 12/21/2014   Opiate use (60 MME/Day) 12/21/2014   Opiate dependence (HCC) 12/21/2014   Encounter for therapeutic drug level monitoring 12/21/2014   Neurogenic pain 12/21/2014  Thoracic facet syndrome (T8-10)  12/21/2014   Lumbar facet syndrome (Bilateral) (R>L) 12/21/2014   Cervical facet syndrome (Right) 12/21/2014   Cervical spondylosis 12/21/2014   Lumbar spondylosis 12/21/2014   Chronic upper extremity pain (Left) 12/21/2014   Chronic cervical radicular pain (Left) 12/21/2014   Chronic upper back pain 12/21/2014   History of thoracic spine surgery (S/P T9-10 IVD spacer) 12/21/2014   Failed back surgical syndrome 12/21/2014   Chronic musculoskeletal pain 12/21/2014   Chronic lower extremity pain (Left) 12/21/2014   Chronic Lumbar radicular pain (Left: L4) 12/21/2014   Coronary artery disease 12/21/2014   History of MI (myocardial infarction) (May 2012) 12/21/2014   Chronic anticoagulation (Effient ) 12/21/2014   History of Meniere's disease 12/21/2014   Sleep apnea 12/21/2014   History of spinal surgery 11/14/2014   Chronic kidney disease, stage 3b (HCC) 09/26/2014   Hypotension 09/22/2014   Benign essential HTN 09/14/2014   Hypertension 09/14/2014   H/O adenomatous polyp of colon 08/12/2014   Dysphagia 08/12/2014   GERD (gastroesophageal reflux disease) 01/25/2014   Arteriosclerosis of coronary artery 08/13/2013   Hyperlipidemia 08/13/2013   CAD (coronary artery disease) 08/13/2013   Hyperlipidemia associated with type 2 diabetes mellitus (HCC) 08/13/2013   Auditory vertigo 12/30/2012   Cervical spinal stenosis 12/11/2012   Arthralgia of shoulder 03/16/2012     Past Medical History:  Diagnosis Date   Allergic rhinitis 12/30/2012   Anginal pain    Bronchitis    hx of   Chronic back pain    thoracic area   Concussion 09/2015   Coronary artery disease    99% blockage   DDD (degenerative disc disease), cervical    Diabetes mellitus without complication (HCC)    insulin  dependent   Dysphagia    GERD (gastroesophageal reflux disease)    History of Meniere's disease 12/21/2014   History of thoracic spine surgery (S/P T9-10 IVD spacer) 12/21/2014   Hypercalcemia     Hypercholesteremia    Hyperkalemia    Hyperlipidemia    Hypertension    sees Dr. Norleen walker [facility]   Myocardial infarction [facility]) 2012   [facility]   Neuromuscular disorder [facility])    diabetic neuropathy in feet   Proteinuria    Renal cell carcinoma (HCC)    Renal disorder    Secondary hyperparathyroidism of renal origin    Severe sepsis (HCC) 02/07/2020   Short-segment Barrett's esophagus    Sleep apnea    mild     Past Surgical History:  Procedure Laterality Date   APPENDECTOMY     age 77   ARTHRODESIS ANTERIOR ANTERIOR CERVICLE SPINE  01/04/2013   BACK SURGERY     fusion thoracic area   CARDIAC CATHETERIZATION     may 2012 and Nov 20, 2010   CARDIAC CATHETERIZATION N/A 06/15/2015   Procedure: Left Heart Cath and Coronary Angiography;  Surgeon: Wolm JINNY Rhyme, MD;  Location: [facility] INVASIVE CV LAB;  Service: Cardiovascular;  Laterality: N/A;   CARDIAC CATHETERIZATION N/A 06/15/2015   Procedure: Coronary Stent Intervention;  Surgeon: Marsa Dooms, MD;  Location: [facility] INVASIVE CV LAB;  Service: Cardiovascular;  Laterality: N/A;   CARDIOVASCULAR STRESS TEST     jan 2014   COLONOSCOPY WITH PROPOFOL  N/A 09/19/2014   Procedure: COLONOSCOPY WITH PROPOFOL ;  Surgeon: Lamar ONEIDA Holmes, MD;  Location: [facility] ENDOSCOPY;  Service: Endoscopy;  Laterality: N/A;   COLONOSCOPY WITH PROPOFOL  N/A 04/03/2021   Procedure: COLONOSCOPY WITH PROPOFOL ;  Surgeon: Maryruth Ole ONEIDA, MD;  Location: [facility] ENDOSCOPY;  Service: Endoscopy;  Laterality: N/A;  IDDM   COLONOSCOPY WITH PROPOFOL  N/A 06/25/2023   Procedure: COLONOSCOPY WITH PROPOFOL ;  Surgeon: Toledo, Ladell POUR, MD;  Location: [facility] ENDOSCOPY;  Service: Gastroenterology;  Laterality: N/A;  IDDM, 1ST CASE   CORONARY ANGIOPLASTY     stent placement x3   coronary stents      x 3 per pt   CYSTOSCOPY W/ RETROGRADES Bilateral 11/24/2023   Procedure: CYSTOSCOPY, WITH RETROGRADE PYELOGRAM;  Surgeon: Renda Glance, MD;   Location: [facility] ORS;  Service: Urology;  Laterality: Bilateral;   CYSTOSCOPY W/ URETERAL STENT PLACEMENT Right 08/28/2023   Procedure: CYSTOSCOPY, WITH RETROGRADE PYELOGRAM AND URETERAL STENT INSERTION;  Surgeon: Twylla Glendia BROCKS, MD;  Location: [facility] ORS;  Service: Urology;  Laterality: Right;  RIGHT STENT PLACEMENT, right RETROGRADE PYELOGRAM   CYSTOSCOPY WITH BIOPSY Right 11/24/2023   Procedure: CYSTOSCOPY, WITH RIGHT URETEROSCOPY, WASHING OF RIGHT RENAL PELVIS, BRUSH BIOPSY OF RIGHT RENAL PELVIS TUMOR, BIOPSY OF RIGHT RENAL PELVIC TUMOR, RIGHT URETERAL STENT EXCHANGE;  Surgeon: Renda Glance, MD;  Location: [facility] ORS;  Service: Urology;  Laterality: Right;   ESOPHAGOGASTRODUODENOSCOPY N/A 09/19/2014   Procedure: ESOPHAGOGASTRODUODENOSCOPY (EGD);  Surgeon: Lamar ONEIDA Holmes, MD;  Location: [facility] ENDOSCOPY;  Service: Endoscopy;  Laterality: N/A;   IR PERC CHOLECYSTOSTOMY  02/09/2020   IR RADIOLOGIST EVAL & MGMT  03/14/2020   LABRINTHECTOMY     1999 right ear   LUMBAR SPINAL CORD SIMULATOR LEAD REMOVAL Right 08/09/2019   Procedure: REMOVAL SPINAL CORD STIMULATOR PERCUTANEOUS LEADS, REMOVAL PULSE GENERATOR;  Surgeon: Bluford Standing, MD;  Location: [facility] ORS;  Service: Neurosurgery;  Laterality: Right;  LOCAL WITH MAC   mastoid shunt Bilateral    left, 2002, 1997 right ear, 1980 right ear   NEPHRECTOMY Left    PULSE GENERATOR IMPLANT N/A 01/18/2019   Procedure: [employer] SPINAL CORD STIMULATOR BATTERY EXCHANGE;  Surgeon: Bluford Standing, MD;  Location: [facility] ORS;  Service: Neurosurgery;  Laterality: N/A;   ROBOT ASSISTED LAPAROSCOPIC NEPHRECTOMY Left 04/20/2020   Procedure: XI ROBOTIC ASSITED LAPAROSCOPIC NEPHRECTOMY;  Surgeon: Renda Glance, MD;  Location: [facility] OR[facility];  Service: Urology;  Laterality: Left;   SAVORY DILATION N/A 09/19/2014   Procedure: SAVORY DILATION;  Surgeon: Lamar ONEIDA Holmes, MD;  Location[facility] ENDOSCOPY;  Service: Endoscopy;  Laterality: N/A;   SHOULDER ARTHROSCOPY WITH SUBACROMIAL  DECOMPRESSION Left 04/06/2012   Procedure: SHOULDER ARTHROSCOPY WITH SUBACROMIAL DECOMPRESSION;  Surgeon: Marcey Raman, MD;  Location: [facility] OR;  Service: Orthopedics;  Laterality: Left;  left shoulder arthroscopy, subacromial decompression and distal clavicle resection   SPINAL CORD STIMULATOR IMPLANT Right     Social History   Socioeconomic History   Marital status: Married    Spouse name: Heron   Number of children: 2   Years of education: Not on file   Highest education level: Not on file  Occupational History   Not on file  Tobacco Use   Smoking status: Never   Smokeless tobacco: Never  Vaping Use   Vaping status: Never Used  Substance and Sexual Activity   Alcohol use: No   Drug use: No   Sexual activity: Not Currently  Other Topics Concern   Not on file  Social History Narrative   Patient hospitalized x 3 events for high K+ Pt had surgery on the 02/09/20   Social Drivers of Health   Financial Resource Strain: Low Risk  (08/21/2022)   Received from [facility]   Overall Financial Resource Strain (CARDIA)  Difficulty of Paying Living Expenses: Not very hard  Food Insecurity: No Food Insecurity (12/15/2023)   Hunger Vital Sign    Worried About Running Out of Food in the Last Year: Never true    Ran Out of Food in the Last Year: Never true  Transportation Needs: No Transportation Needs (12/15/2023)   PRAPARE - Administrator, Civil Service (Medical): No    Lack of Transportation (Non-Medical): No  Physical Activity: Not on file  Stress: Not on file  Social Connections: Not on file  Intimate Partner Violence: Not At Risk (12/15/2023)   Humiliation, Afraid, Rape, and Kick questionnaire    Fear of Current or Ex-Partner: No    Emotionally Abused: No    Physically Abused: No    Sexually Abused: No     Family History  Problem Relation Age of Onset   Heart disease Mother    Diabetes Mother    Heart disease Father    Cancer Sister     Heart disease Maternal Aunt    Heart disease Maternal Uncle    Diabetes Maternal Grandmother    Diabetes Paternal Grandmother      Current Outpatient Medications:    acetaminophen  (TYLENOL ) 325 MG tablet, Take 1 tablet (325 mg total) by mouth every 6 (six) hours as needed for mild pain (pain score 1-3), fever or headache., Disp: , Rfl:    ALPRAZolam  (XANAX ) 0.5 MG tablet, Take 1 tablet (0.5 mg total) by mouth at bedtime. (Patient taking differently: Take 0.5 mg by mouth at bedtime. PRN), Disp: 30 tablet, Rfl: 0   cetirizine (ZYRTEC) 10 MG tablet, Take 10 mg by mouth daily. , Disp: , Rfl:    cyclobenzaprine  (FLEXERIL ) 10 MG tablet, Take 1 tablet (10 mg total) by mouth 3 (three) times daily as needed for muscle spasms., Disp: 30 tablet, Rfl: 0   diazepam  (VALIUM ) 5 MG tablet, Take 5 mg by mouth 3 (three) times daily as needed., Disp: , Rfl:    diltiazem  (CARDIZEM  CD) 240 MG 24 hr capsule, Take 240 mg by mouth daily., Disp: , Rfl:    esomeprazole (NEXIUM) 40 MG capsule, Take 40 mg by mouth 2 (two) times daily., Disp: , Rfl:    ezetimibe  (ZETIA ) 10 MG tablet, Take 10 mg by mouth daily., Disp: , Rfl:    gabapentin  (NEURONTIN ) 800 MG tablet, Take 800 mg by mouth 2 (two) times daily., Disp: , Rfl:    HUMULIN  R U-500 KWIKPEN 500 UNIT/ML KwikPen, Inject 65-75 Units into the skin See admin instructions. Sliding scale Can adjust according to blood sugar Home med, Disp: , Rfl:    isosorbide  mononitrate (IMDUR ) 30 MG 24 hr tablet, Take 30 mg by mouth daily., Disp: , Rfl:    latanoprost  (XALATAN ) 0.005 % ophthalmic solution, Place 1 drop into both eyes at bedtime., Disp: , Rfl:    lubiprostone  (AMITIZA ) 24 MCG capsule, Take 24 mcg by mouth 2 (two) times daily with a meal., Disp: , Rfl:    Magnesium  Oxide 250 MG TABS, Take 500 mg by mouth daily., Disp: , Rfl:    meclizine  (ANTIVERT ) 25 MG tablet, Take 25 mg by mouth See admin instructions. Take 25 mg in the morning, and additional 25 mg if needed up to a  total of three per day for Meniere's disease, Disp: , Rfl:    metFORMIN  (GLUCOPHAGE ) 1000 MG tablet, Take 1,000 mg by mouth 2 (two) times daily., Disp: , Rfl:    metoprolol  succinate (TOPROL -XL) 100 MG  24 hr tablet, Take 100 mg by mouth 2 (two) times daily. Take with or immediately following a meal., Disp: , Rfl:    montelukast  (SINGULAIR ) 10 MG tablet, Take 10 mg by mouth at bedtime. , Disp: , Rfl:    multivitamin (RENA-VIT) TABS tablet, Take 1 tablet by mouth at bedtime., Disp: 30 tablet, Rfl: 0   niacin  (NIASPAN ) 500 MG CR tablet, Take 500 mg by mouth at bedtime., Disp: , Rfl:    nitroGLYCERIN  (NITROSTAT ) 0.4 MG SL tablet, Place 0.4 mg under the tongue as needed., Disp: , Rfl:    oxybutynin  (DITROPAN ) 5 MG tablet, Take 1 tablet (5 mg total) by mouth every 8 (eight) hours as needed for bladder spasms., Disp: 30 tablet, Rfl: 0   oxyCODONE  (OXY IR/ROXICODONE ) 5 MG immediate release tablet, Take 1 tablet (5 mg total) by mouth every 6 (six) hours as needed for severe pain (pain score 7-10). Must last 30 days., Disp: 120 tablet, Rfl: 0   phenazopyridine  (PYRIDIUM ) 200 MG tablet, Take 1 tablet (200 mg total) by mouth 3 (three) times daily as needed for pain., Disp: 30 tablet, Rfl: 1   prasugrel  (EFFIENT ) 10 MG TABS tablet, Take 10 mg by mouth daily., Disp: , Rfl:    rosuvastatin  (CRESTOR ) 40 MG tablet, Take 40 mg by mouth at bedtime., Disp: , Rfl:    senna-docusate (SENOKOT-S) 8.6-50 MG tablet, Take 1 tablet by mouth 2 (two) times daily., Disp: 30 tablet, Rfl: 0   tamsulosin  (FLOMAX ) 0.4 MG CAPS capsule, Take 1 capsule (0.4 mg total) by mouth daily after supper., Disp: 30 capsule, Rfl: 0   [Paused] chlorthalidone (HYGROTON) 25 MG tablet, Take 25 mg by mouth daily. (Patient not taking: Reported on 10/14/2023), Disp: , Rfl:    JARDIANCE 25 MG TABS tablet, Take 25 mg by mouth daily., Disp: , Rfl:    MOVANTIK 25 MG TABS tablet, Take 25 mg by mouth every morning., Disp: , Rfl:    naloxone  (NARCAN ) nasal spray  4 mg/0.1 mL, Place 1 spray into the nose as needed for up to 365 doses (for opioid-induced respiratory depresssion). In case of emergency (overdose), spray once into each nostril. If no response within 3 minutes, repeat application and call 911., Disp: 1 each, Rfl: 0   oxyCODONE  (OXY IR/ROXICODONE ) 5 MG immediate release tablet, Take 1 tablet (5 mg total) by mouth every 6 (six) hours as needed for severe pain (pain score 7-10). Must last 30 days. (Patient not taking: Reported on 12/15/2023), Disp: 120 tablet, Rfl: 0   oxyCODONE  (OXY IR/ROXICODONE ) 5 MG immediate release tablet, Take 1 tablet (5 mg total) by mouth every 6 (six) hours as needed for severe pain (pain score 7-10). Must last 30 days., Disp: 120 tablet, Rfl: 0   Physical exam:  Vitals:   12/15/23 1422  BP: 119/73  Pulse: 65  Resp: 19  Temp: 97.6 F (36.4 C)  TempSrc: Tympanic  SpO2: 96%  Weight: 193 lb 1.6 oz (87.6 kg)  Height: 5' 9 (1.753 m)   Physical Exam Cardiovascular:     Rate and Rhythm: Normal rate and regular rhythm.     Heart sounds: Normal heart sounds.  Pulmonary:     Effort: Pulmonary effort is normal.     Breath sounds: Normal breath sounds.  Abdominal:     General: Bowel sounds are normal.     Palpations: Abdomen is soft.  Skin:    General: Skin is warm and dry.  Neurological:     Mental  Status: He is alert and oriented to person, place, and time.           Latest Ref Rng & Units 11/24/2023    1:41 PM  CMP  Glucose 70 - 99 mg/dL 787   BUN 8 - 23 mg/dL 27   Creatinine 9.38 - 1.24 mg/dL 8.20   Sodium 864 - 854 mmol/L 141   Potassium 3.5 - 5.1 mmol/L 4.4   Chloride 98 - 111 mmol/L 102   CO2 22 - 32 mmol/L 24   Calcium  8.9 - 10.3 mg/dL 89.3       Latest Ref Rng & Units 11/24/2023    1:41 PM  CBC  WBC 4.0 - 10.5 K/uL 7.1   Hemoglobin 13.0 - 17.0 g/dL 85.2   Hematocrit 60.9 - 52.0 % 44.8   Platelets 150 - 400 K/uL 239     No images are attached to the encounter.  NM PET Image Initial  (PI) Skull Base To Thigh Result Date: 12/17/2023 EXAM: PET AND CT SKULL BASE TO MID THIGH 12/17/2023 09:36:26 AM TECHNIQUE: RADIOPHARMACEUTICAL: 10.78 mCi F-18 FDG Uptake time 60 minutes. Glucose level 163 mg/dl. Background blood pool activity SUV: 3.0 PET imaging was acquired from the base of the skull to the mid thighs. Non-contrast enhanced computed tomography was obtained for attenuation correction and anatomic localization. COMPARISON: Multiple exams, including 07/23/2023 CT scan. CLINICAL HISTORY: Bladder cancer, invasive, monitor. Urothelial carcinoma of right kidney. History of left nephrectomy. Gross hematuria. Pain in back. FINDINGS: HEAD AND NECK: Prior mastoidectomies. No metabolically active cervical lymphadenopathy. CHEST: Thoracic aortic, coronary artery, and branch vessel atheromatous vascular calcifications. Mild cardiomegaly. No metabolically active pulmonary nodules. No metabolically active lymphadenopathy. ABDOMEN AND PELVIS: Left nephrectomy. Right double J ureteral stent in place. Systemic atherosclerosis is present, including the aorta and iliac arteries. No metabolically active intraperitoneal mass. No metabolically active lymphadenopathy. Physiologic activity within the gastrointestinal and genitourinary systems. BONES AND SOFT TISSUE: No abnormal FDG activity localizes to the bones. No metabolically active aggressive osseous lesion. IMPRESSION: 1. No evidence of metabolically active disease. Please note that lesions along the urothelium could be occult due to adjacent excreted radiopharmaceutical. 2. Thoracic aortic, coronary artery, and branch vessel atherosclerotic calcifications. 3. Systemic atherosclerosis involving the aorta and iliac arteries. 4. Mild cardiomegaly. Electronically signed by: Ryan Salvage MD 12/17/2023 09:58 AM EDT RP Workstation: [id number]    Assessment and plan- Patient is a 68 y.o. male referred for newly diagnosed high-grade urothelial carcinoma of the  upper urothelial tract of the right renal pelvis  Based on CT findings there are tumor burden in the right renal pelvis appears low.  However getting a PET CT scan to rule out distant disease spread.  I will discuss with Dr. Renda if neoadjuvant chemotherapy with gemcitabine and cisplatin would be helpful prior to surgery which may not be the case given the low burden of disease.  For localized upper urothelial tract carcinomas which are high-grade-question is if there would be any nephron sparing approaches that would be feasible instead of right nephrectomy which would make patient dialysis dependent.  Population-based and retrospective studies have shown similar outcomes between nephron sparing approaches versus radical nephroureterectomy patients with organ localized high-grade urothelial carcinoma.  Nephron sparing approaches may be associated with higher risk of recurrence and may require more intensive surveillance and monitoring approaches if that is even an option and that would need to be decided by urology.  There are no prospective studies in this area.  I will  also discuss his case at tumor board next week. I had reached out to rad onc as well although data for SBRT in high grade urothelial carcinoma is poor. Patient wishes to explore all his options before committing to right nephroureterectomy   Thank you for this kind referral and the opportunity to participate in the care of this patient   Visit Diagnosis 1. Urothelial carcinoma of kidney, right (HCC)     Dr. Annah Skene, MD, MPH [facility] [phone] 12/28/2023

## 2024-01-05 ENCOUNTER — Ambulatory Visit: Attending: Nurse Practitioner | Admitting: Nurse Practitioner

## 2024-01-05 ENCOUNTER — Encounter: Payer: Self-pay | Admitting: Nurse Practitioner

## 2024-01-05 DIAGNOSIS — M47894 Other spondylosis, thoracic region: Secondary | ICD-10-CM | POA: Diagnosis present

## 2024-01-05 DIAGNOSIS — M79602 Pain in left arm: Secondary | ICD-10-CM | POA: Insufficient documentation

## 2024-01-05 DIAGNOSIS — M961 Postlaminectomy syndrome, not elsewhere classified: Secondary | ICD-10-CM | POA: Diagnosis present

## 2024-01-05 DIAGNOSIS — M549 Dorsalgia, unspecified: Secondary | ICD-10-CM | POA: Insufficient documentation

## 2024-01-05 DIAGNOSIS — Z79899 Other long term (current) drug therapy: Secondary | ICD-10-CM | POA: Insufficient documentation

## 2024-01-05 DIAGNOSIS — G894 Chronic pain syndrome: Secondary | ICD-10-CM | POA: Insufficient documentation

## 2024-01-05 DIAGNOSIS — M47812 Spondylosis without myelopathy or radiculopathy, cervical region: Secondary | ICD-10-CM | POA: Insufficient documentation

## 2024-01-05 DIAGNOSIS — M7918 Myalgia, other site: Secondary | ICD-10-CM | POA: Diagnosis present

## 2024-01-05 DIAGNOSIS — G8929 Other chronic pain: Secondary | ICD-10-CM | POA: Diagnosis present

## 2024-01-05 DIAGNOSIS — Z79891 Long term (current) use of opiate analgesic: Secondary | ICD-10-CM | POA: Insufficient documentation

## 2024-01-05 DIAGNOSIS — M545 Low back pain, unspecified: Secondary | ICD-10-CM | POA: Diagnosis not present

## 2024-01-05 DIAGNOSIS — M79605 Pain in left leg: Secondary | ICD-10-CM | POA: Diagnosis present

## 2024-01-05 MED ORDER — OXYCODONE HCL 5 MG PO TABS: 5.0000 mg | ORAL_TABLET | Freq: Four times a day (QID) | ORAL | 0 refills | Status: AC | PRN

## 2024-01-05 NOTE — Progress Notes (Signed)
 PROVIDER NOTE: Interpretation of information contained herein should be left to medically-trained personnel. Specific patient instructions are provided elsewhere under Patient Instructions section of medical record. This document was created in part using AI and STT-dictation technology, any transcriptional errors that may result from this process are unintentional.  Patient: Kenneth Osborn  Service: E/M   PCP: Valora Lynwood FALCON, MD  DOB: [birth year]  DOS: 01/05/2024  Provider: Emmy MARLA Blanch, NP  MRN: [id number]  Delivery: Face-to-face  Specialty: Interventional Pain Management  Type: Established Patient  Setting: Ambulatory outpatient facility  Specialty designation: 09  Referring Prov.: Melanee Annah BROCKS, MD  Location: Outpatient office facility       History of present illness (HPI) [name], a 68 y.o. year old male, is here today because of his back pain (R>L). Mr. Klima primary complain today is Back Pain  Pertinent problems: Mr. 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 Lamar Notch at [facility] on 01/04/2013); Neurogenic pain; Thoracic facet syndrome (T8-10); Lumbar facet syndrome (Bilateral) (R>L); Cervical facet syndrome (Right); Cervical spondylosis; Lumbar spondylosis; Chronic upper extremity pain (Left); and Chronic pain syndrome on their pertinent problem list.   Pain Assessment: Severity of Chronic pain is reported as a 3 /10. Location: Back Lower/Denies. Onset: More than a month ago. Quality: Aching. Timing: Constant. Modifying factor(s): Medication,Rest. Vitals:  height is 5' 9 (1.753 m) and weight is 195 lb (88.5 kg). His temporal temperature is 98.4 F (36.9 C). His blood pressure is 140/98 (abnormal) and his pulse is 99. His respiration is 18 and oxygen saturation is 97%.  BMI: Estimated body mass index  is 28.8 kg/m as calculated from the following:   Height as of this encounter: 5' 9 (1.753 m).   Weight as of this encounter: 195 lb (88.5 kg).  Last encounter: 10/14/2023. Last procedure: Visit date not found.  Reason for encounter: medication management.  The patient indicates doing well with the current medication regimen.  No side effects or adverse reaction reported to the medication.  The patient complains of bilateral low back pain however; prescribed pain medication regimen provides relief.   Discussed the use of AI scribe software for clinical note transcription with the patient, who gave verbal consent to proceed.  History of Present Illness   Kenneth Osborn is a 68 year old male with kidney cancer who presents with lower back pain. He experiences pain across his lower back, rated as 3 out of 10 in intensity, located on the side with the affected kidney. The current cancer is internal, whereas previously it was external. He is currently taking oxycodone  5 mg four times a day for pain management without side effects and has been on this medication for several years. He had his other kidney removed three and a half years ago due to a similar issue.     Pharmacotherapy Assessment   Oxycodone  (Oxy IR/Roxicodone ) 5 mg immediate release tablet every 6 hours as needed for pain. MME=22.50 Monitoring: [place] PMP: PDMP reviewed during this encounter.       Pharmacotherapy: No side-effects or adverse reactions reported. Compliance: No problems identified. Effectiveness: Clinically acceptable.  Kenneth Osborn, [place]  01/05/2024  2:14 PM  Sign when Signing Visit Nursing Pain Medication Assessment:  Safety precautions to be maintained throughout the outpatient stay will include: orient to surroundings, keep  bed in low position, maintain call bell within reach at all times, provide assistance with transfer out of bed and ambulation.  Medication Inspection Compliance: Pill count conducted  under aseptic conditions, in front of the patient. Neither the pills nor the bottle was removed from the patient's sight at any time. Once count was completed pills were immediately returned to the patient in their original bottle.  Medication: Oxycodone  IR Pill/Patch Count: 56 of 120 pills/patches remain Pill/Patch Appearance: Markings consistent with prescribed medication Bottle Appearance: Standard pharmacy container. Clearly labeled. Filled Date: 09 / 28 / 2025 Last Medication intake:  Today    UDS:  Summary  Date Value Ref Range Status  10/14/2023 FINAL  Final    Comment:    ==================================================================== [employer] Select 13 (MW) ==================================================================== Test                             Result       Flag       Units  Drug Present and Declared for Prescription Verification   Desmethyldiazepam              332          EXPECTED   ng/mg creat   Oxazepam                       694          EXPECTED   ng/mg creat   Temazepam                      866          EXPECTED   ng/mg creat    Desmethyldiazepam, oxazepam, and temazepam are expected metabolites    of diazepam . Desmethyldiazepam and oxazepam are also expected    metabolites of other drugs, including chlordiazepoxide, prazepam,    clorazepate, and halazepam. Oxazepam is an expected metabolite of    temazepam. Oxazepam and temazepam are also available as scheduled    prescription medications.    Oxycodone                       263          EXPECTED   ng/mg creat   Oxymorphone                    413          EXPECTED   ng/mg creat   Noroxycodone                   705          EXPECTED   ng/mg creat   Noroxymorphone                 161          EXPECTED   ng/mg creat    Sources of oxycodone  are scheduled prescription medications.    Oxymorphone, noroxycodone, and noroxymorphone are expected    metabolites of oxycodone . Oxymorphone is also available  as a    scheduled prescription medication.  Drug Absent but Declared for Prescription Verification   Alprazolam                      Not Detected UNEXPECTED ng/mg creat ==================================================================== Test  Result    Flag   Units      Ref Range   Creatinine              102              mg/dL      >=79 ==================================================================== Declared Medications:  The flagging and interpretation on this report are based on the  following declared medications.  Unexpected results may arise from  inaccuracies in the declared medications.   **Note: The testing scope of this panel includes these medications:   Alprazolam  (Xanax )  Diazepam  (Valium )  Oxycodone  (Roxicodone )   **Note: The testing scope of this panel does not include the  following reported medications:   Acetaminophen  (Tylenol )  Cetirizine (Zyrtec)  Chlorthalidone  Cyclobenzaprine  (Flexeril )  Diltiazem  (Cardizem )  Empagliflozin (Jardiance)  Esomeprazole (Nexium)  Ezetimibe  (Zetia )  Folic Acid   Insulin  (Humulin )  Isosorbide  (Imdur )  Latanoprost  (Xalatan )  Lubiprostone  (Amitiza )  Magnesium  (Mag-Ox)  Meclizine  (Antivert )  Metformin  (Glucophage )  Metoprolol  (Toprol )  Montelukast  (Singulair )  Naloxone  (Narcan )  Nitroglycerin  (Nitrostat )  Oxybutynin  (Ditropan )  Prasugrel  (Effient )  Rosuvastatin  (Crestor )  Sennosides (Senokot)  Tamsulosin  (Flomax )  Vitamin B  Vitamin B3 ==================================================================== For clinical consultation, please call [phone]. ====================================================================     No results found for: CBDTHCR No results found for: D8THCCBX No results found for: D9THCCBX  ROS  Constitutional: Denies any fever or chills Gastrointestinal: No reported hemesis, hematochezia, vomiting, or acute GI distress Musculoskeletal: back pain  (R>L) Neurological: No reported episodes of acute onset apraxia, aphasia, dysarthria, agnosia, amnesia, paralysis, loss of coordination, or loss of consciousness  Medication Review  ALPRAZolam , FreeStyle Libre 3 Plus Sensor, Magnesium  Oxide, acetaminophen , cetirizine, chlorthalidone, cyclobenzaprine , diazepam , diltiazem , empagliflozin, esomeprazole, ezetimibe , gabapentin , insulin  regular human CONCENTRATED, isosorbide  mononitrate, latanoprost , lubiprostone , meclizine , metFORMIN , metoprolol  succinate, montelukast , multivitamin, naloxegol oxalate, naloxone , niacin , nitroGLYCERIN , oxyCODONE , oxybutynin , phenazopyridine , prasugrel , rosuvastatin , senna-docusate, and tamsulosin   History Review  Allergy: Mr. Kamer has no known allergies. Drug: Mr. Grzelak  reports no history of drug use. Alcohol:  reports no history of alcohol use. Tobacco:  reports that he has never smoked. He has never used smokeless tobacco. Social: Mr. Olano  reports that he has never smoked. He has never used smokeless tobacco. He reports that he does not drink alcohol and does not use drugs. Medical:  has a past medical history of Allergic rhinitis (12/30/2012), Anginal pain, Bronchitis, Chronic back pain, Concussion (09/2015), Coronary artery disease, DDD (degenerative disc disease), cervical, Diabetes mellitus without complication (HCC), Dysphagia, GERD (gastroesophageal reflux disease), History of Meniere's disease (12/21/2014), History of thoracic spine surgery (S/P T9-10 IVD spacer) (12/21/2014), Hypercalcemia, Hypercholesteremia, Hyperkalemia, Hyperlipidemia, Hypertension, Myocardial infarction (HCC) (2012), Neuromuscular disorder (HCC), Proteinuria, Renal cell carcinoma (HCC), Renal disorder, Secondary hyperparathyroidism of renal origin, Severe sepsis (HCC) (02/07/2020), Short-segment Barrett's esophagus, and Sleep apnea. Surgical: Mr. Moncrief  has a past surgical history that includes Cardiovascular stress test; Cardiac  catheterization; Labrinthectomy; mastoid shunt (Bilateral); Back surgery; Shoulder arthroscopy with subacromial decompression (Left, 04/06/2012); ARTHRODESIS ANTERIOR ANTERIOR CERVICLE SPINE (01/04/2013); Colonoscopy with propofol  (N/A, 09/19/2014); Esophagogastroduodenoscopy (N/A, 09/19/2014); Savory dilation (N/A, 09/19/2014); Spinal cord stimulator implant (Right); Cardiac catheterization (N/A, 06/15/2015); Cardiac catheterization (N/A, 06/15/2015); Pulse generator implant (N/A, 01/18/2019); Lumbar spinal cord stimulator lead removal (Right, 08/09/2019); IR Perc Cholecystostomy (02/09/2020); IR Radiologist Eval & Mgmt (03/14/2020); Coronary angioplasty; Appendectomy; Robot assisted laparoscopic nephrectomy (Left, 04/20/2020); Colonoscopy with propofol  (N/A, 04/03/2021); Nephrectomy (Left); Colonoscopy with propofol  (N/A, 06/25/2023); Cystoscopy w/ ureteral stent placement (Right, 08/28/2023); coronary stents ; Cystoscopy with biopsy (  Right, 11/24/2023); and Cystoscopy w/ retrogrades (Bilateral, 11/24/2023). Family: family history includes Cancer in his sister; Diabetes in his maternal grandmother, mother, and paternal grandmother; Heart disease in his father, maternal aunt, maternal uncle, and mother.  Laboratory Chemistry Profile   Renal Lab Results  Component Value Date   BUN 27 (H) 11/24/2023   CREATININE 1.79 (H) 11/24/2023   GFRAA 58 (L) 01/08/2019   GFRNONAA 41 (L) 11/24/2023    Hepatic Lab Results  Component Value Date   AST 31 09/03/2023   ALT 30 09/03/2023   ALBUMIN  2.9 (L) 09/03/2023   ALKPHOS 85 09/03/2023   HCVAB NON REACTIVE 02/08/2020   LIPASE 28 12/11/2020   AMMONIA 24 02/07/2020    Electrolytes Lab Results  Component Value Date   NA 141 11/24/2023   K 4.4 11/24/2023   CL 102 11/24/2023   CALCIUM  10.6 (H) 11/24/2023   MG 1.8 09/03/2023   PHOS 4.0 09/03/2023    Bone No results found for: VD25OH, VD125OH2TOT, CI6874NY7, CI7874NY7, 25OHVITD1, 25OHVITD2,  25OHVITD3, TESTOFREE, TESTOSTERONE  Inflammation (CRP: Acute Phase) (ESR: Chronic Phase) Lab Results  Component Value Date   CRP 1.4 (H) 03/22/2015   ESRSEDRATE 9 03/22/2015   LATICACIDVEN 1.2 08/28/2023         Note: Above Lab results reviewed.  Recent Imaging Review  NM PET Image Initial (PI) Skull Base To Thigh EXAM: PET AND CT SKULL BASE TO MID THIGH 12/17/2023 09:36:26 AM  TECHNIQUE:  RADIOPHARMACEUTICAL: 10.78 mCi F-18 FDG Uptake time 60 minutes. Glucose level 163 mg/dl. Background blood pool activity SUV: 3.0 PET imaging was acquired from the base of the skull to the mid thighs. Non-contrast enhanced computed tomography was obtained for attenuation correction and anatomic localization.  COMPARISON: Multiple exams, including 07/23/2023 CT scan.  CLINICAL HISTORY: Bladder cancer, invasive, monitor. Urothelial carcinoma of right kidney. History of left nephrectomy. Gross hematuria. Pain in back.  FINDINGS:  HEAD AND NECK: Prior mastoidectomies. No metabolically active cervical lymphadenopathy.  CHEST: Thoracic aortic, coronary artery, and branch vessel atheromatous vascular calcifications. Mild cardiomegaly. No metabolically active pulmonary nodules. No metabolically active lymphadenopathy.  ABDOMEN AND PELVIS: Left nephrectomy. Right double J ureteral stent in place. Systemic atherosclerosis is present, including the aorta and iliac arteries. No metabolically active intraperitoneal mass. No metabolically active lymphadenopathy. Physiologic activity within the gastrointestinal and genitourinary systems.  BONES AND SOFT TISSUE: No abnormal FDG activity localizes to the bones. No metabolically active aggressive osseous lesion.  IMPRESSION: 1. No evidence of metabolically active disease. Please note that lesions along the urothelium could be occult due to adjacent excreted radiopharmaceutical. 2. Thoracic aortic, coronary artery, and branch vessel  atherosclerotic calcifications. 3. Systemic atherosclerosis involving the aorta and iliac arteries. 4. Mild cardiomegaly.  Electronically signed by: Ryan Salvage MD 12/17/2023 09:58 AM EDT RP Workstation: [id number] Note: Reviewed        Physical Exam  Vitals: BP (!) 140/98 (BP Location: Right Arm, Patient Position: Sitting, Cuff Size: Normal)   Pulse 99   Temp 98.4 F (36.9 C) (Temporal)   Resp 18   Ht 5' 9 (1.753 m)   Wt 195 lb (88.5 kg)   SpO2 97%   BMI 28.80 kg/m  BMI: Estimated body mass index is 28.8 kg/m as calculated from the following:   Height as of this encounter: 5' 9 (1.753 m).   Weight as of this encounter: 195 lb (88.5 kg). Ideal: Ideal body weight: 70.7 kg (155 lb 13.8 oz) Adjusted ideal body weight: 77.8 kg (171 lb  8.3 oz) General appearance: Well nourished, well developed, and well hydrated. In no apparent acute distress Mental status: Alert, oriented x 3 (person, place, & time)       Respiratory: No evidence of acute respiratory distress Eyes: PERLA  Musculoskeletal: +LBP (R>L) Assessment   Diagnosis Status  1. Chronic low back pain (Bilateral) w/o sciatica   2. Chronic pain syndrome   3. Pharmacologic therapy   4. Cervical facet syndrome (Right)   5. 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 Lamar Notch at [facility] on 01/04/2013)   6. Thoracic facet syndrome (T8-10)   7. Chronic upper back pain   8. Chronic musculoskeletal pain   9. Failed back surgical syndrome   10. Chronic lower extremity pain (Left)   11. Chronic upper extremity pain (Left)   12. Chronic use of opiate for therapeutic purpose   13. Long term prescription benzodiazepine use    Controlled Controlled Controlled   Updated Problems: No problems updated.  Plan of Care  Problem-specific:  Assessment and Plan    Chronic low back pain and chronic pain syndrome Chronic low back pain at level 3/10 managed with oxycodone  5 mg four times daily without side  effects. - Continue oxycodone  5 mg four times daily. Patient's pain is controlled with oxycodone , Will continue on current medication regimen.  Prescribing drug monitoring (PDMP) reviewed; findings consistent with the use of prescribed medication and no evidence of other narcotic misuse or abuse. No side effects or adverse reaction reported to medication. Schedule follow-up in 90 days for medication management.   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 Current cancer is internal.  Preoperative evaluation and dialysis planning for kidney cancer Preoperative evaluation and dialysis planning for kidney cancer.  Scheduled consultations with Dr. Rosalva and Dr. Renda.       Mr. KINLEY FERRENTINO has a current medication list which includes the following long-term medication(s): cetirizine, cyclobenzaprine , esomeprazole, ezetimibe , gabapentin , humulin  r u-500 kwikpen, metoprolol  succinate, montelukast , niacin , [Paused] chlorthalidone, [START ON 01/18/2024] oxycodone , [START ON 02/17/2024] oxycodone , and [START ON 03/18/2024] oxycodone .  Pharmacotherapy (Medications Ordered): Meds ordered this encounter  Medications   oxyCODONE  (OXY IR/ROXICODONE ) 5 MG immediate release tablet    Sig: Take 1 tablet (5 mg total) by mouth every 6 (six) hours as needed for severe pain (pain score 7-10). Must last 30 days.    Dispense:  120 tablet    Refill:  0    DO NOT: delete (not duplicate); no partial-fill (will deny script to complete), no refill request (F/U required). DISPENSE: 1 day early if closed on fill date. WARN: No CNS-depressants within 8 hrs of med.   oxyCODONE  (OXY IR/ROXICODONE ) 5 MG immediate release tablet    Sig: Take 1 tablet (5 mg total) by mouth every 6 (six) hours as needed for severe pain (pain score 7-10). Must last 30 days.    Dispense:  120 tablet    Refill:  0    DO NOT: delete (not duplicate); no  partial-fill (will deny script to complete), no refill request (F/U required). DISPENSE: 1 day early if closed on fill date. WARN: No CNS-depressants within 8 hrs of med.   oxyCODONE  (OXY IR/ROXICODONE ) 5 MG immediate release tablet    Sig: Take 1 tablet (5 mg total) by mouth every 6 (six) hours as needed for severe pain (pain score 7-10). Must last 30 days.    Dispense:  120 tablet  Refill:  0    DO NOT: delete (not duplicate); no partial-fill (will deny script to complete), no refill request (F/U required). DISPENSE: 1 day early if closed on fill date. WARN: No CNS-depressants within 8 hrs of med.   Orders:  No orders of the defined types were placed in this encounter.       Return in about 3 months (around 04/06/2024) for (F2F), (MM), Emmy Blanch NP.    Recent Visits Date Type Provider Dept  10/14/23 Office Visit Ladiamond Gallina K, NP [facility]  10/13/23 Office Visit Kirill Chatterjee K, NP [facility]  Showing recent visits within past 90 days and meeting all other requirements Today's Visits Date Type Provider Dept  01/05/24 Office Visit Garry Bochicchio K, NP [facility]  Showing today's visits and meeting all other requirements Future Appointments Date Type Provider Dept  03/31/24 Appointment Jacobb Alen K, NP [facility]  Showing future appointments within next 90 days and meeting all other requirements  I discussed the assessment and treatment plan with the patient. The patient was provided an opportunity to ask questions and all were answered. The patient agreed with the plan and demonstrated an understanding of the instructions.  Patient advised to call back or seek an in-person evaluation if the symptoms or condition worsens.  I personally spent a total of 30 minutes in the care of the patient today including preparing to see the patient, getting/reviewing separately obtained history, performing a medically appropriate exam/evaluation,  counseling and educating, placing orders, referring and communicating with other health care professionals, documenting clinical information in the EHR, independently interpreting results, communicating results, and coordinating care.   Note by: Kiing Deakin K Maahi Lannan, NP (TTS and AI technology used. I apologize for any typographical errors that were not detected and corrected.) Date: 01/05/2024; Time: 2:49 PM

## 2024-01-05 NOTE — Progress Notes (Signed)
 Nursing Pain Medication Assessment:  Safety precautions to be maintained throughout the outpatient stay will include: orient to surroundings, keep bed in low position, maintain call bell within reach at all times, provide assistance with transfer out of bed and ambulation.  Medication Inspection Compliance: Pill count conducted under aseptic conditions, in front of the patient. Neither the pills nor the bottle was removed from the patient's sight at any time. Once count was completed pills were immediately returned to the patient in their original bottle.  Medication: Oxycodone  IR Pill/Patch Count: 56 of 120 pills/patches remain Pill/Patch Appearance: Markings consistent with prescribed medication Bottle Appearance: Standard pharmacy container. Clearly labeled. Filled Date: 09 / 28 / 2025 Last Medication intake:  Today

## 2024-01-06 ENCOUNTER — Encounter: Admitting: Nurse Practitioner

## 2024-01-06 ENCOUNTER — Ambulatory Visit
Admission: RE | Admit: 2024-01-06 | Discharge: 2024-01-06 | Disposition: A | Discharge status: Home / Self Care | Source: Ambulatory Visit | Attending: Radiation Oncology | Admitting: Radiation Oncology

## 2024-01-06 ENCOUNTER — Encounter: Payer: Self-pay | Admitting: Radiation Oncology

## 2024-01-06 VITALS — BP 123/82 | HR 86 | Temp 97.5°F | Resp 20 | Wt 201.0 lb

## 2024-01-06 DIAGNOSIS — Z7902 Long term (current) use of antithrombotics/antiplatelets: Secondary | ICD-10-CM | POA: Diagnosis not present

## 2024-01-06 DIAGNOSIS — C651 Malignant neoplasm of right renal pelvis: Secondary | ICD-10-CM | POA: Insufficient documentation

## 2024-01-06 DIAGNOSIS — Z85528 Personal history of other malignant neoplasm of kidney: Secondary | ICD-10-CM | POA: Diagnosis not present

## 2024-01-06 DIAGNOSIS — Z7984 Long term (current) use of oral hypoglycemic drugs: Secondary | ICD-10-CM | POA: Diagnosis not present

## 2024-01-06 DIAGNOSIS — Z905 Acquired absence of kidney: Secondary | ICD-10-CM | POA: Diagnosis not present

## 2024-01-06 DIAGNOSIS — N1832 Chronic kidney disease, stage 3b: Secondary | ICD-10-CM | POA: Diagnosis not present

## 2024-01-06 DIAGNOSIS — Z79899 Other long term (current) drug therapy: Secondary | ICD-10-CM | POA: Insufficient documentation

## 2024-01-06 DIAGNOSIS — E785 Hyperlipidemia, unspecified: Secondary | ICD-10-CM | POA: Diagnosis not present

## 2024-01-06 DIAGNOSIS — E119 Type 2 diabetes mellitus without complications: Secondary | ICD-10-CM | POA: Diagnosis not present

## 2024-01-06 DIAGNOSIS — I10 Essential (primary) hypertension: Secondary | ICD-10-CM | POA: Diagnosis not present

## 2024-01-06 DIAGNOSIS — C642 Malignant neoplasm of left kidney, except renal pelvis: Secondary | ICD-10-CM

## 2024-01-06 NOTE — Consult Note (Signed)
 NEW PATIENT EVALUATION  Name: Kenneth Osborn  MRN: [id number]  Date:   01/06/2024     DOB: January 10, 1956   This 68 y.o. 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  REFERRING PHYSICIAN: Valora Lynwood FALCON, MD  CHIEF COMPLAINT:  Chief Complaint  Patient presents with   Kidney cancer    DIAGNOSIS: The encounter diagnosis was Malignant neoplasm of left kidney, except renal pelvis (HCC).   PREVIOUS INVESTIGATIONS:  CT scans PET CT scans reviewed Clinical notes reviewed Pathology reports reviewed Case presented at tumor conference  HPI: Patient is a 68 year old male with significant medical history of hypertension hyperlipidemia type 2 diabetes stage IIIb CKD.  He has a history of renal cell carcinoma papillary subtype and undergone left nephrectomy in 22 after attempted partial nephrectomy.  Patient did not require adjuvant treatment.  He recently in July presented with hematuria CT scan showed hyperdense material in the right renal collecting system.  Urology performed biopsies and brushings of right renal collecting system which showed low-grade papillary urethral carcinoma which was an area noted on right retrograde pyelography.  Cytology from the right pelvis also showed high-grade urothelial carcinoma.  He has seen Dr. Renda and recommendation for right nephro ureterectomy was made although this would result in renal dialysis.  Patient did have a PET scan showing no evidence of metabolically active disease no evidence of distant metastatic disease was noted.  Patient is fairly asymptomatic still sees occasional hematuria specifically denies abdominal pain or discomfort.  PLANNED TREATMENT REGIMEN: SBRT  PAST MEDICAL HISTORY:  has a past medical history of Allergic rhinitis (12/30/2012), Anginal pain, Bronchitis, Chronic back pain, Concussion  (09/2015), Coronary artery disease, DDD (degenerative disc disease), cervical, Diabetes mellitus without complication (HCC), Dysphagia, GERD (gastroesophageal reflux disease), History of Meniere's disease (12/21/2014), History of thoracic spine surgery (S/P T9-10 IVD spacer) (12/21/2014), Hypercalcemia, Hypercholesteremia, Hyperkalemia, Hyperlipidemia, Hypertension, Myocardial infarction (HCC) (2012), Neuromuscular disorder (HCC), Proteinuria, Renal cell carcinoma (HCC), Renal disorder, Secondary hyperparathyroidism of renal origin, Severe sepsis (HCC) (02/07/2020), Short-segment Barrett's esophagus, and Sleep apnea.    PAST SURGICAL HISTORY:  Past Surgical History:  Procedure Laterality Date   APPENDECTOMY     age 57   ARTHRODESIS ANTERIOR ANTERIOR CERVICLE SPINE  01/04/2013   BACK SURGERY     fusion thoracic area   CARDIAC CATHETERIZATION     may 2012 and Nov 20, 2010   CARDIAC CATHETERIZATION N/A 06/15/2015   Procedure: Left Heart Cath and Coronary Angiography;  Surgeon: Wolm JINNY Rhyme, MD;  Location: [facility];  Service: Cardiovascular;  Laterality: N/A;   CARDIAC CATHETERIZATION N/A 06/15/2015   Procedure: Coronary Stent Intervention;  Surgeon: Marsa Dooms, MD;  Location: [facility];  Service: Cardiovascular;  Laterality: N/A;   CARDIOVASCULAR STRESS TEST     jan 2014   COLONOSCOPY WITH PROPOFOL  N/A 09/19/2014   Procedure: COLONOSCOPY WITH PROPOFOL ;  Surgeon: Lamar ONEIDA Holmes, MD;  Location: [facility] ENDOSCOPY;  Service: Endoscopy;  Laterality: N/A;   COLONOSCOPY WITH PROPOFOL  N/A 04/03/2021   Procedure: COLONOSCOPY WITH PROPOFOL ;  Surgeon: Maryruth Ole ONEIDA, MD;  Location: [facility] ENDOSCOPY;  Service: Endoscopy;  Laterality: N/A;  IDDM   COLONOSCOPY WITH PROPOFOL  N/A 06/25/2023   Procedure: COLONOSCOPY WITH PROPOFOL ;  Surgeon: Toledo, Ladell POUR, MD;  Location: [facility] ENDOSCOPY;  Service: Gastroenterology;  Laterality: N/A;  IDDM, 1ST CASE   CORONARY ANGIOPLASTY  stent placement x3   coronary stents      x 3 per pt   CYSTOSCOPY W/ RETROGRADES Bilateral 11/24/2023   Procedure: CYSTOSCOPY, WITH RETROGRADE PYELOGRAM;  Surgeon: Renda Glance, MD;  Location: [facility];  Service: Urology;  Laterality: Bilateral;   CYSTOSCOPY W/ URETERAL STENT PLACEMENT Right 08/28/2023   Procedure: CYSTOSCOPY, WITH RETROGRADE PYELOGRAM AND URETERAL STENT INSERTION;  Surgeon: Twylla Glendia BROCKS, MD;  Location: [facility] ORS;  Service: Urology;  Laterality: Right;  RIGHT STENT PLACEMENT, right RETROGRADE PYELOGRAM   CYSTOSCOPY WITH BIOPSY Right 11/24/2023   Procedure: CYSTOSCOPY, WITH RIGHT URETEROSCOPY, WASHING OF RIGHT RENAL PELVIS, BRUSH BIOPSY OF RIGHT RENAL PELVIS TUMOR, BIOPSY OF RIGHT RENAL PELVIC TUMOR, RIGHT URETERAL STENT EXCHANGE;  Surgeon: Renda Glance, MD;  Location: [facility];  Service: Urology;  Laterality: Right;   ESOPHAGOGASTRODUODENOSCOPY N/A 09/19/2014   Procedure: ESOPHAGOGASTRODUODENOSCOPY (EGD);  Surgeon: Lamar ONEIDA Holmes, MD;  Location: [facility] ENDOSCOPY;  Service: Endoscopy;  Laterality: N/A;   IR PERC CHOLECYSTOSTOMY  02/09/2020   IR RADIOLOGIST EVAL & MGMT  03/14/2020   LABRINTHECTOMY     1999 right ear   LUMBAR SPINAL CORD STIMULATOR LEAD REMOVAL Right 08/09/2019   Procedure: REMOVAL SPINAL CORD STIMULATOR PERCUTANEOUS LEADS, REMOVAL PULSE GENERATOR;  Surgeon: Bluford Standing, MD;  Location: [facility];  Service: Neurosurgery;  Laterality: Right;  LOCAL WITH MAC   mastoid shunt Bilateral    left, 2002, 1997 right ear, 1980 right ear   NEPHRECTOMY Left    PULSE GENERATOR IMPLANT N/A 01/18/2019   Procedure: MEDTRONIC SPINAL CORD STIMULATOR BATTERY EXCHANGE;  Surgeon: Bluford Standing, MD;  Location: [facility] ORS;  Service: Neurosurgery;  Laterality: N/A;   ROBOT ASSISTED LAPAROSCOPIC NEPHRECTOMY Left 04/20/2020   Procedure: XI ROBOTIC ASSITED LAPAROSCOPIC NEPHRECTOMY;  Surgeon: Renda Glance, MD;  Location: [facility];  Service: Urology;  Laterality: Left;   SAVORY DILATION N/A  09/19/2014   Procedure: SAVORY DILATION;  Surgeon: Lamar ONEIDA Holmes, MD;  Location[facility] ENDOSCOPY;  Service: Endoscopy;  Laterality: N/A;   SHOULDER ARTHROSCOPY WITH SUBACROMIAL DECOMPRESSION Left 04/06/2012   Procedure: SHOULDER ARTHROSCOPY WITH SUBACROMIAL DECOMPRESSION;  Surgeon: Marcey Raman, MD;  Location: [facility];  Service: Orthopedics;  Laterality: Left;  left shoulder arthroscopy, subacromial decompression and distal clavicle resection   SPINAL CORD STIMULATOR IMPLANT Right     FAMILY HISTORY: family history includes Cancer in his sister; Diabetes in his maternal grandmother, mother, and paternal grandmother; Heart disease in his father, maternal aunt, maternal uncle, and mother.  SOCIAL HISTORY:  reports that he has never smoked. He has never used smokeless tobacco. He reports that he does not drink alcohol and does not use drugs.  ALLERGIES: Patient has no known allergies.  MEDICATIONS:  Current Outpatient Medications  Medication Sig Dispense Refill   acetaminophen  (TYLENOL ) 325 MG tablet Take 1 tablet (325 mg total) by mouth every 6 (six) hours as needed for mild pain (pain score 1-3), fever or headache.     ALPRAZolam  (XANAX ) 0.5 MG tablet Take 1 tablet (0.5 mg total) by mouth at bedtime. 30 tablet 0   cetirizine (ZYRTEC) 10 MG tablet Take 10 mg by mouth daily.      [Paused] chlorthalidone (HYGROTON) 25 MG tablet Take 25 mg by mouth daily.     Continuous Glucose Sensor (FREESTYLE LIBRE 3 PLUS SENSOR) MISC USE AS DIRECTED EVERY 15 DAYS     cyclobenzaprine  (FLEXERIL ) 10 MG tablet Take 1 tablet (10 mg total) by mouth 3 (three) times daily as needed for muscle spasms. 30  tablet 0   diazepam  (VALIUM ) 5 MG tablet Take 5 mg by mouth 3 (three) times daily as needed.     diltiazem  (CARDIZEM  CD) 240 MG 24 hr capsule Take 240 mg by mouth daily.     esomeprazole (NEXIUM) 40 MG capsule Take 40 mg by mouth 2 (two) times daily.     ezetimibe  (ZETIA ) 10 MG tablet Take 10 mg by mouth daily.      gabapentin  (NEURONTIN ) 800 MG tablet Take 800 mg by mouth 2 (two) times daily.     HUMULIN  R U-500 KWIKPEN 500 UNIT/ML KwikPen Inject 65-75 Units into the skin See admin instructions. Sliding scale Can adjust according to blood sugar Home med     isosorbide  mononitrate (IMDUR ) 30 MG 24 hr tablet Take 30 mg by mouth daily.     JARDIANCE 25 MG TABS tablet Take 25 mg by mouth daily.     latanoprost  (XALATAN ) 0.005 % ophthalmic solution Place 1 drop into both eyes at bedtime.     lubiprostone  (AMITIZA ) 24 MCG capsule Take 24 mcg by mouth 2 (two) times daily with a meal.     Magnesium  Oxide 250 MG TABS Take 500 mg by mouth daily.     meclizine  (ANTIVERT ) 25 MG tablet Take 25 mg by mouth See admin instructions. Take 25 mg in the morning, and additional 25 mg if needed up to a total of three per day for Meniere's disease     metFORMIN  (GLUCOPHAGE ) 1000 MG tablet Take 1,000 mg by mouth 2 (two) times daily.     metoprolol  succinate (TOPROL -XL) 100 MG 24 hr tablet Take 100 mg by mouth 2 (two) times daily. Take with or immediately following a meal.     montelukast  (SINGULAIR ) 10 MG tablet Take 10 mg by mouth at bedtime.      MOVANTIK 25 MG TABS tablet Take 25 mg by mouth every morning.     multivitamin (RENA-VIT) TABS tablet Take 1 tablet by mouth at bedtime. 30 tablet 0   naloxone  (NARCAN ) nasal spray 4 mg/0.1 mL Place 1 spray into the nose as needed for up to 365 doses (for opioid-induced respiratory depresssion). In case of emergency (overdose), spray once into each nostril. If no response within 3 minutes, repeat application and call 911. 1 each 0   niacin  (NIASPAN ) 500 MG CR tablet Take 500 mg by mouth at bedtime.     nitroGLYCERIN  (NITROSTAT ) 0.4 MG SL tablet Place 0.4 mg under the tongue as needed.     oxybutynin  (DITROPAN ) 5 MG tablet Take 1 tablet (5 mg total) by mouth every 8 (eight) hours as needed for bladder spasms. 30 tablet 0   [START ON 01/18/2024] oxyCODONE  (OXY IR/ROXICODONE ) 5 MG immediate  release tablet Take 1 tablet (5 mg total) by mouth every 6 (six) hours as needed for severe pain (pain score 7-10). Must last 30 days. 120 tablet 0   [START ON 02/17/2024] oxyCODONE  (OXY IR/ROXICODONE ) 5 MG immediate release tablet Take 1 tablet (5 mg total) by mouth every 6 (six) hours as needed for severe pain (pain score 7-10). Must last 30 days. 120 tablet 0   [START ON 03/18/2024] oxyCODONE  (OXY IR/ROXICODONE ) 5 MG immediate release tablet Take 1 tablet (5 mg total) by mouth every 6 (six) hours as needed for severe pain (pain score 7-10). Must last 30 days. 120 tablet 0   phenazopyridine  (PYRIDIUM ) 200 MG tablet Take 1 tablet (200 mg total) by mouth 3 (three) times daily as needed for pain.  30 tablet 1   prasugrel  (EFFIENT ) 10 MG TABS tablet Take 10 mg by mouth daily.     rosuvastatin  (CRESTOR ) 40 MG tablet Take 40 mg by mouth at bedtime.     senna-docusate (SENOKOT-S) 8.6-50 MG tablet Take 1 tablet by mouth 2 (two) times daily. 30 tablet 0   tamsulosin  (FLOMAX ) 0.4 MG CAPS capsule Take 1 capsule (0.4 mg total) by mouth daily after supper. 30 capsule 0   No current facility-administered medications for this encounter.    ECOG PERFORMANCE STATUS:  1 - Symptomatic but completely ambulatory  REVIEW OF SYSTEMS: Patient denies any weight loss, fatigue, weakness, fever, chills or night sweats. Patient denies any loss of vision, blurred vision. Patient denies any ringing  of the ears or hearing loss. No irregular heartbeat. Patient denies heart murmur or history of fainting. Patient denies any chest pain or pain radiating to her upper extremities. Patient denies any shortness of breath, difficulty breathing at night, cough or hemoptysis. Patient denies any swelling in the lower legs. Patient denies any nausea vomiting, vomiting of blood, or coffee ground material in the vomitus. Patient denies any stomach pain. Patient states has had normal bowel movements no significant constipation or diarrhea. Patient  denies any dysuria, hematuria or significant nocturia. Patient denies any problems walking, swelling in the joints or loss of balance. Patient denies any skin changes, loss of hair or loss of weight. Patient denies any excessive worrying or anxiety or significant depression. Patient denies any problems with insomnia. Patient denies excessive thirst, polyuria, polydipsia. Patient denies any swollen glands, patient denies easy bruising or easy bleeding. Patient denies any recent infections, allergies or URI. Patient s visual fields have not changed significantly in recent time.   PHYSICAL EXAM: BP 123/82   Pulse 86   Temp (!) 97.5 F (36.4 C) (Tympanic)   Resp 20   Wt 201 lb (91.2 kg)   BMI 29.68 kg/m  Well-developed well-nourished patient in NAD. HEENT reveals PERLA, EOMI, discs not visualized.  Oral cavity is clear. No oral mucosal lesions are identified. Neck is clear without evidence of cervical or supraclavicular adenopathy. Lungs are clear to A&P. Cardiac examination is essentially unremarkable with regular rate and rhythm without murmur rub or thrill. Abdomen is benign with no organomegaly or masses noted. Motor sensory and DTR levels are equal and symmetric in the upper and lower extremities. Cranial nerves II through XII are grossly intact. Proprioception is intact. No peripheral adenopathy or edema is identified. No motor or sensory levels are noted. Crude visual fields are within normal range.  LABORATORY DATA: Pathology and cytology reports reviewed    RADIOLOGY RESULTS: CT scans PET CT scans ultrasound all reviewed   IMPRESSION: High-grade urothelial carcinoma of the right renal pelvis in 68 year old male status post left nephrectomy for renal cell carcinoma  PLAN: SBRT treatment has been used in renal pelvic urothelial carcinomas..  Although this is an unusual and not studied clinical scenario [facility] has reported good results with SBRT 50 Gray in 5 fractions for this type of lesion.   This preserves renal function and would alleviate the need for dialysis.  Risks and benefits of treatment including extremely low side effect profile were reviewed with the patient.  [facility] has also concluded that even with a small case of stereotactic radiation therapy for [facility] is safe and well-tolerated with good tumor response to ablative doses of radiation.  Certainly this would not preclude surgery in the future for salvage in this patient  and I believe it is prudent to offer this type of treatment in order to spare the patient definitive dialysis.  I have certainly indicated the standard of care recommendation would be nephroureterectomy as Dr. Renda has suggested.  Patient would like to attempt SBRT of his renal pelvic urothelial carcinoma with a full knowledge that still nephroureterectomy may be possible in the future as salvage.  I have personally set up and ordered CT simulation for next week.  Patient will contemplate my recommendations.  I would like to take this opportunity to thank you for allowing me to participate in the care of your patient.SABRA Marcey Penton, MD

## 2024-01-07 ENCOUNTER — Inpatient Hospital Stay

## 2024-01-13 ENCOUNTER — Ambulatory Visit
Admission: RE | Admit: 2024-01-13 | Discharge: 2024-01-13 | Disposition: A | Discharge status: Home / Self Care | Source: Ambulatory Visit | Attending: Radiation Oncology | Admitting: Radiation Oncology

## 2024-01-13 DIAGNOSIS — Z510 Encounter for antineoplastic radiation therapy: Secondary | ICD-10-CM | POA: Diagnosis present

## 2024-01-13 DIAGNOSIS — C651 Malignant neoplasm of right renal pelvis: Secondary | ICD-10-CM | POA: Insufficient documentation

## 2024-01-21 ENCOUNTER — Inpatient Hospital Stay: Attending: Oncology

## 2024-01-21 DIAGNOSIS — Z510 Encounter for antineoplastic radiation therapy: Secondary | ICD-10-CM | POA: Diagnosis present

## 2024-01-21 DIAGNOSIS — C651 Malignant neoplasm of right renal pelvis: Secondary | ICD-10-CM | POA: Diagnosis not present

## 2024-01-21 DIAGNOSIS — C642 Malignant neoplasm of left kidney, except renal pelvis: Secondary | ICD-10-CM

## 2024-01-21 NOTE — Progress Notes (Signed)
 Multidisciplinary Oncology Council Documentation  TAYSEAN WAGER was presented by our [facility] on 01/21/2024, which included representatives from:  Palliative Care Dietitian  Physical/Occupational Therapist Nurse Navigator Genetics Social work Survivorship RN Financial Navigator Research RN   Rieley currently presents with history of urothelial cancer  We reviewed previous medical and familial history, history of present illness, and recent lab results along with all available histopathologic and imaging studies. The MOC considered available treatment options and made the following recommendations/referrals:  SW  The MOC is a meeting of clinicians from various specialty areas who evaluate and discuss patients for whom a multidisciplinary approach is being considered. Final determinations in the plan of care are those of the provider(s).   Today's extended care, comprehensive team conference, [place] was not present for the discussion and was not examined.

## 2024-01-27 ENCOUNTER — Ambulatory Visit
Admission: RE | Admit: 2024-01-27 | Discharge: 2024-01-27 | Disposition: A | Source: Ambulatory Visit | Attending: Radiation Oncology | Admitting: Radiation Oncology

## 2024-01-27 ENCOUNTER — Other Ambulatory Visit: Payer: Self-pay

## 2024-01-27 DIAGNOSIS — Z510 Encounter for antineoplastic radiation therapy: Secondary | ICD-10-CM | POA: Diagnosis not present

## 2024-01-27 LAB — RAD ONC ARIA SESSION SUMMARY
Course Elapsed Days: 0
Plan Fractions Treated to Date: 1
Plan Prescribed Dose Per Fraction: 10 Gy
Plan Total Fractions Prescribed: 5
Plan Total Prescribed Dose: 50 Gy
Reference Point Dosage Given to Date: 10 Gy
Reference Point Session Dosage Given: 10 Gy
Session Number: 1

## 2024-01-28 ENCOUNTER — Inpatient Hospital Stay: Admitting: Licensed Clinical Social Worker

## 2024-01-28 NOTE — Progress Notes (Signed)
 [facility] Clinical Social Work  Clinical Social Work was referred by [facility] for assessment of psychosocial needs.  Clinical Social Worker contacted patient by phone to offer support and assess for needs.    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  He stated he enjoyed his job and left for health reasons.  Interventions: Provided patient with information about CSW role, including counseling, resources and advance directives.  Patient scheduled an appointment to complete his advance directives.       Follow Up Plan:  CSW will see patient on 12/11 at 10am.    Macario CHRISTELLA Au, LCSW  Clinical Social Worker [facility]

## 2024-01-29 ENCOUNTER — Inpatient Hospital Stay

## 2024-01-29 ENCOUNTER — Other Ambulatory Visit: Payer: Self-pay

## 2024-01-29 ENCOUNTER — Ambulatory Visit
Admission: RE | Admit: 2024-01-29 | Discharge: 2024-01-29 | Disposition: A | Discharge status: Home / Self Care | Source: Ambulatory Visit | Attending: Radiation Oncology | Admitting: Radiation Oncology

## 2024-01-29 DIAGNOSIS — Z510 Encounter for antineoplastic radiation therapy: Secondary | ICD-10-CM | POA: Diagnosis not present

## 2024-01-29 LAB — RAD ONC ARIA SESSION SUMMARY
Course Elapsed Days: 2
Plan Fractions Treated to Date: 2
Plan Prescribed Dose Per Fraction: 10 Gy
Plan Total Fractions Prescribed: 5
Plan Total Prescribed Dose: 50 Gy
Reference Point Dosage Given to Date: 20 Gy
Reference Point Session Dosage Given: 10 Gy
Session Number: 2

## 2024-01-29 NOTE — Progress Notes (Signed)
 [facility] Healthcare Advance Directives Clinical Social Work  Patient presented to Advance Directives Clinic  to review and complete healthcare advance directives.  Clinical Social Worker met with patient.  The patient designated Kenneth Osborn as their primary healthcare agent and Joshua Hopes as their secondary agent.  Patient also completed healthcare living will.    Documents were notarized and copies made for patient/family. Clinical Social Worker will send documents to medical records to be scanned into patient's chart. Clinical Social Worker encouraged patient/family to contact with any additional questions or concerns.   Macario CHRISTELLA Au, LCSW Clinical Social Worker [facility]

## 2024-02-03 ENCOUNTER — Ambulatory Visit
Admission: RE | Admit: 2024-02-03 | Discharge: 2024-02-03 | Disposition: A | Source: Ambulatory Visit | Attending: Radiation Oncology | Admitting: Radiation Oncology

## 2024-02-03 ENCOUNTER — Other Ambulatory Visit: Payer: Self-pay

## 2024-02-03 DIAGNOSIS — Z510 Encounter for antineoplastic radiation therapy: Secondary | ICD-10-CM | POA: Diagnosis not present

## 2024-02-03 LAB — RAD ONC ARIA SESSION SUMMARY
Course Elapsed Days: 7
Plan Fractions Treated to Date: 3
Plan Prescribed Dose Per Fraction: 10 Gy
Plan Total Fractions Prescribed: 5
Plan Total Prescribed Dose: 50 Gy
Reference Point Dosage Given to Date: 30 Gy
Reference Point Session Dosage Given: 10 Gy
Session Number: 3

## 2024-02-05 ENCOUNTER — Ambulatory Visit
Admission: RE | Admit: 2024-02-05 | Discharge: 2024-02-05 | Attending: Radiation Oncology | Admitting: Radiation Oncology

## 2024-02-05 ENCOUNTER — Other Ambulatory Visit: Payer: Self-pay

## 2024-02-05 DIAGNOSIS — Z510 Encounter for antineoplastic radiation therapy: Secondary | ICD-10-CM | POA: Diagnosis not present

## 2024-02-05 LAB — RAD ONC ARIA SESSION SUMMARY
Course Elapsed Days: 9
Plan Fractions Treated to Date: 4
Plan Prescribed Dose Per Fraction: 10 Gy
Plan Total Fractions Prescribed: 5
Plan Total Prescribed Dose: 50 Gy
Reference Point Dosage Given to Date: 40 Gy
Reference Point Session Dosage Given: 10 Gy
Session Number: 4

## 2024-02-10 ENCOUNTER — Other Ambulatory Visit: Payer: Self-pay

## 2024-02-10 ENCOUNTER — Ambulatory Visit
Admission: RE | Admit: 2024-02-10 | Discharge: 2024-02-10 | Disposition: A | Discharge status: Home / Self Care | Source: Ambulatory Visit | Attending: Radiation Oncology | Admitting: Radiation Oncology

## 2024-02-10 DIAGNOSIS — Z510 Encounter for antineoplastic radiation therapy: Secondary | ICD-10-CM | POA: Diagnosis not present

## 2024-02-10 LAB — RAD ONC ARIA SESSION SUMMARY
Course Elapsed Days: 14
Plan Fractions Treated to Date: 5
Plan Prescribed Dose Per Fraction: 10 Gy
Plan Total Fractions Prescribed: 5
Plan Total Prescribed Dose: 50 Gy
Reference Point Dosage Given to Date: 50 Gy
Reference Point Session Dosage Given: 10 Gy
Session Number: 5

## 2024-02-11 NOTE — Radiation Completion Notes (Signed)
 Patient Name: Kenneth Osborn, Kenneth Osborn MRN: [id number] Date of Birth: 1955-04-19 Referring Physician: LYNWOOD NULL, M.D. Date of Service: 2024-02-11 Radiation Oncologist: Marcey Penton, M.D. [facility]                             RADIATION ONCOLOGY END OF TREATMENT NOTE     Diagnosis: C64.2 Malignant neoplasm of left kidney, except renal pelvis; C65.1 Malignant neoplasm of right renal pelvis Staging on 2023-12-28: Malignant neoplasm of left kidney, except renal pelvis (HCC) T=cT1a, N=cN0, M=cM0 Intent: Curative     HPI: Patient is a 68 year old male with significant medical history of hypertension hyperlipidemia type 2 diabetes stage IIIb CKD.  He has a history of renal cell carcinoma papillary subtype and undergone left nephrectomy in 22 after attempted partial nephrectomy.  Patient did not require adjuvant treatment.  He recently in July presented with hematuria CT scan showed hyperdense material in the right renal collecting system.  Urology performed biopsies and brushings of right renal collecting system which showed low-grade papillary urethral carcinoma which was an area noted on right retrograde pyelography.  Cytology from the right pelvis also showed high-grade urothelial carcinoma.  He has seen Dr. Renda and recommendation for right nephro ureterectomy was made although this would result in renal dialysis.  Patient did have a PET scan showing no evidence of metabolically active disease no evidence of distant metastatic disease was noted.  Patient is fairly asymptomatic still sees occasional hematuria specifically denies abdominal pain or discomfort.      ==========DELIVERED PLANS==========  First Treatment Date: 2024-01-27 Last Treatment Date: 2024-02-10   Plan Name: [id number] Site: Kidney, Right Technique: SBRT/SRT-IMRT Mode: Photon Dose Per Fraction: 10 Gy Prescribed Dose (Delivered / Prescribed): 50 Gy / 50 Gy Prescribed Fxs (Delivered / Prescribed): 5 /  5     ==========ON TREATMENT VISIT DATES========== 2024-01-27, 2024-01-29, 2024-02-03, 2024-02-03, 2024-02-05, 2024-02-10     ==========UPCOMING VISITS========== 03/31/2024 [facility] MGMT 20 Tobie Emmy POUR, NP  03/15/2024 [facility] RAD ONCOLOGY FOLLOW UP 30 Chrystal, Marcey, MD  02/25/2024 [facility] MED ONC EST PT Melanee Annah BROCKS, MD        ==========APPENDIX - ON TREATMENT VISIT NOTES==========   See weekly On Treatment Notes in Epic for details in the Media tab (listed as Progress notes on the On Treatment Visit Dates listed above).

## 2024-02-18 ENCOUNTER — Emergency Department
Admission: EM | Admit: 2024-02-18 | Discharge: 2024-02-19 | Disposition: A | Discharge status: Home / Self Care | Attending: Emergency Medicine | Admitting: Emergency Medicine

## 2024-02-18 ENCOUNTER — Emergency Department

## 2024-02-18 ENCOUNTER — Other Ambulatory Visit: Payer: Self-pay

## 2024-02-18 DIAGNOSIS — E1122 Type 2 diabetes mellitus with diabetic chronic kidney disease: Secondary | ICD-10-CM | POA: Insufficient documentation

## 2024-02-18 DIAGNOSIS — Z85528 Personal history of other malignant neoplasm of kidney: Secondary | ICD-10-CM | POA: Insufficient documentation

## 2024-02-18 DIAGNOSIS — I129 Hypertensive chronic kidney disease with stage 1 through stage 4 chronic kidney disease, or unspecified chronic kidney disease: Secondary | ICD-10-CM | POA: Insufficient documentation

## 2024-02-18 DIAGNOSIS — R319 Hematuria, unspecified: Secondary | ICD-10-CM

## 2024-02-18 DIAGNOSIS — N1832 Chronic kidney disease, stage 3b: Secondary | ICD-10-CM | POA: Diagnosis not present

## 2024-02-18 DIAGNOSIS — N189 Chronic kidney disease, unspecified: Secondary | ICD-10-CM

## 2024-02-18 LAB — COMPREHENSIVE METABOLIC PANEL WITH GFR
ALT: 75 U/L — ABNORMAL HIGH (ref 0–44)
AST: 101 U/L — ABNORMAL HIGH (ref 15–41)
Albumin: 4.3 g/dL (ref 3.5–5.0)
Alkaline Phosphatase: 106 U/L (ref 38–126)
Anion gap: 15 (ref 5–15)
BUN: 41 mg/dL — ABNORMAL HIGH (ref 8–23)
CO2: 22 mmol/L (ref 22–32)
Calcium: 10 mg/dL (ref 8.9–10.3)
Chloride: 100 mmol/L (ref 98–111)
Creatinine, Ser: 1.89 mg/dL — ABNORMAL HIGH (ref 0.61–1.24)
GFR, Estimated: 38 mL/min — ABNORMAL LOW
Glucose, Bld: 333 mg/dL — ABNORMAL HIGH (ref 70–99)
Potassium: 3.9 mmol/L (ref 3.5–5.1)
Sodium: 138 mmol/L (ref 135–145)
Total Bilirubin: <0.2 mg/dL (ref 0.0–1.2)
Total Protein: 6.9 g/dL (ref 6.5–8.1)

## 2024-02-18 LAB — CBC WITH DIFFERENTIAL/PLATELET
Abs Immature Granulocytes: 0.04 K/uL (ref 0.00–0.07)
Basophils Absolute: 0.1 K/uL (ref 0.0–0.1)
Basophils Relative: 1 %
Eosinophils Absolute: 0.3 K/uL (ref 0.0–0.5)
Eosinophils Relative: 3 %
HCT: 41 % (ref 39.0–52.0)
Hemoglobin: 13.9 g/dL (ref 13.0–17.0)
Immature Granulocytes: 1 %
Lymphocytes Relative: 32 %
Lymphs Abs: 2.4 K/uL (ref 0.7–4.0)
MCH: 26.6 pg (ref 26.0–34.0)
MCHC: 33.9 g/dL (ref 30.0–36.0)
MCV: 78.4 fL — ABNORMAL LOW (ref 80.0–100.0)
Monocytes Absolute: 0.7 K/uL (ref 0.1–1.0)
Monocytes Relative: 10 %
Neutro Abs: 4.1 K/uL (ref 1.7–7.7)
Neutrophils Relative %: 53 %
Platelets: 182 K/uL (ref 150–400)
RBC: 5.23 MIL/uL (ref 4.22–5.81)
RDW: 15.4 % (ref 11.5–15.5)
WBC: 7.6 K/uL (ref 4.0–10.5)
nRBC: 0 % (ref 0.0–0.2)

## 2024-02-18 NOTE — ED Triage Notes (Signed)
 POV from home for CC of blood in urine for the last few hours. Hx of kidney cancer in '22 resulting in L kidney removal, recurrence of kidney cancer dx this year, and finished radiation treatments on 02/10/24. Ambulatory with steady gait to triage. NAD at this time.

## 2024-02-18 NOTE — ED Provider Notes (Incomplete)
 "  [facility] Provider Note    Event Date/Time   First MD Initiated Contact with Patient 02/18/24 2257     (approximate)   History   Hematuria   HPI Kenneth Osborn is a 68 y.o. male  with PMH notable for hypertension, hyperlipidemia, type 2 diabetes, stage IIIb CKD.  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 Renda and recommendation for right nephro ureterectomy was made although this would result in renal dialysis Patient did have a PET scan showing no evidence of metabolically active disease no evidence of distant metastatic disease was noted.  Sees Dr. 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  He presents to [facility] ED tonight for evaluation of acute onset hematuria.  He said that he has a little bit of pain in his privates, but that seems to be associated with urinating.  He has some pain in the right lower part of his abdomen but it is an achy pain and minimal.  He has had no nausea or vomiting.  He is still urinating normally and has normal intake.  No fever.  He called Dr. Melanee the oncologist earlier this evening and she encouraged him to come to the  emergency department.      Physical Exam   Triage Vital Signs: ED Triage Vitals [02/18/24 2231]  Encounter Vitals Group     BP (!) 156/88     Girls Systolic BP Percentile      Girls Diastolic BP Percentile      Boys Systolic BP Percentile      Boys Diastolic BP Percentile      Pulse Rate (!) 105     Resp 19     Temp 97.8 F (36.6 C)     Temp Source Oral     SpO2 96 %     Weight 88.5 kg (195 lb)     Height 1.753 m (5' 9)     Head Circumference      Peak Flow      Pain Score 5     Pain Loc      Pain Education      Exclude from Growth Chart     Most recent vital signs: Vitals:   02/18/24 2231  BP: (!) 156/88  Pulse: (!) 105  Resp: 19  Temp: 97.8 F (36.6 C)  SpO2: 96%    General: Awake, no distress.  Watching TV, pleasant and conversant, well-appearing. CV:  Good peripheral perfusion.  Borderline tachycardia, regular rhythm. Resp:  Normal effort. Speaking easily and comfortably, no accessory muscle usage nor intercostal retractions.   Abd:  No distention.  Minimal tenderness to palpation, some tenderness in the suprapubic region.  No GU swelling. Other:  No flank tenderness to percussion.   ED Results / Procedures /  Treatments   Labs (all labs ordered are listed, but only abnormal results are displayed) Labs Reviewed  CBC WITH DIFFERENTIAL/PLATELET - Abnormal; Notable for the following components:      Result Value   MCV 78.4 (*)    All other components within normal limits  COMPREHENSIVE METABOLIC PANEL WITH GFR - Abnormal; Notable for the following components:   Glucose, Bld 333 (*)    BUN 41 (*)    Creatinine, Ser 1.89 (*)    AST 101 (*)    ALT 75 (*)    GFR, Estimated 38 (*)    All other components within normal limits  URINALYSIS, ROUTINE W REFLEX MICROSCOPIC     EKG  ***   RADIOLOGY See ED course for details   PROCEDURES:  Critical Care performed: No  Procedures    IMPRESSION / MDM / ASSESSMENT AND PLAN / ED COURSE  I  reviewed the triage vital signs and the nursing notes.                              Differential diagnosis includes, but is not limited to, cancer recurrence  Patient's presentation is most consistent with acute presentation with potential threat to life or bodily function.  Labs/studies ordered: Urinalysis, CMP, CBC with differential, CT renal stone study  Interventions/Medications given:  Medications - No data to display  (Note:  hospital course my include additional interventions and/or labs/studies not listed above.)   Patient without signs or symptoms of infection, I suspect the mild tachycardia triage was due to anxiety in the exertion of coming into the ED, but he is not tachycardic at rest and appears very comfortable.  Minimal discomfort on exam, no leukocytosis, stable CKD.  I will consult nephrology and hematology/oncology given that they are familiar with him and may have specific recommendations for emergent/urgent workup until the patient can follow-up as scheduled next week with his specialist.   Clinical Course as of 02/18/24 2342  Wed Feb 18, 2024  2316 Paged Dr. Dennise to discuss [CF]  2316 asdf consulted Dr. Brigid, now paging Dr. Melanee [CF]  2326 asdf consulted with Dr. Melanee, strongly advised speaking with urology, paging Dr. Devere with urology asdf [CF]  2329 I consulted with Dr. Devere with urology.  He agreed with the plan suggested by Dr. Dennise, which is to obtain a noncontrast renal stone protocol CT and verify that the patient has no evidence of infection on urinalysis.  He said that short of intervening due to high-grade obstruction there is nothing else urology has offered in the short-term and outpatient follow-up next week should be appropriate.  I am ordering the CT renal stone study and urinalysis has been sent. [CF]    Clinical Course User Index [CF] Gordan Huxley, MD     FINAL CLINICAL IMPRESSION(S) / ED DIAGNOSES   Final diagnoses:  None      Rx / DC Orders   ED Discharge Orders     None        Note:  This document was prepared using Dragon voice recognition software and may include unintentional dictation errors. "

## 2024-02-18 NOTE — ED Provider Notes (Signed)
 "  [facility] Provider Note    Event Date/Time   First MD Initiated Contact with Patient 02/18/24 2257     (approximate)   History   Hematuria   HPI Kenneth Osborn is a 68 y.o. male  with PMH notable for hypertension, hyperlipidemia, type 2 diabetes, stage IIIb CKD.  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 Renda and recommendation for right nephro ureterectomy was made although this would result in renal dialysis Patient did have a PET scan showing no evidence of metabolically active disease no evidence of distant metastatic disease was noted.  Sees Dr. 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  He presents to [facility] ED tonight for evaluation of acute onset hematuria.  He said that he has a little bit of pain in his privates, but that seems to be associated with urinating.  He has some pain in the right lower part of his abdomen but it is an achy pain and minimal.  He has had no nausea or vomiting.  He is still urinating normally and has normal intake.  No fever.  He called Dr. Melanee the oncologist earlier this evening and she encouraged him to come to the  emergency department.      Physical Exam   Triage Vital Signs: ED Triage Vitals [02/18/24 2231]  Encounter Vitals Group     BP (!) 156/88     Girls Systolic BP Percentile      Girls Diastolic BP Percentile      Boys Systolic BP Percentile      Boys Diastolic BP Percentile      Pulse Rate (!) 105     Resp 19     Temp 97.8 F (36.6 C)     Temp Source Oral     SpO2 96 %     Weight 88.5 kg (195 lb)     Height 1.753 m (5' 9)     Head Circumference      Peak Flow      Pain Score 5     Pain Loc      Pain Education      Exclude from Growth Chart     Most recent vital signs: Vitals:   02/18/24 2231  BP: (!) 156/88  Pulse: (!) 105  Resp: 19  Temp: 97.8 F (36.6 C)  SpO2: 96%    General: Awake, no distress.  Watching TV, pleasant and conversant, well-appearing. CV:  Good peripheral perfusion.  Borderline tachycardia, regular rhythm. Resp:  Normal effort. Speaking easily and comfortably, no accessory muscle usage nor intercostal retractions.   Abd:  No distention.  Minimal tenderness to palpation, some tenderness in the suprapubic region.  No GU swelling. Other:  No flank tenderness to percussion.   ED Results / Procedures /  Treatments   Labs (all labs ordered are listed, but only abnormal results are displayed) Labs Reviewed  CBC WITH DIFFERENTIAL/PLATELET - Abnormal; Notable for the following components:      Result Value   MCV 78.4 (*)    All other components within normal limits  COMPREHENSIVE METABOLIC PANEL WITH GFR - Abnormal; Notable for the following components:   Glucose, Bld 333 (*)    BUN 41 (*)    Creatinine, Ser 1.89 (*)    AST 101 (*)    ALT 75 (*)    GFR, Estimated 38 (*)    All other components within normal limits  URINALYSIS, ROUTINE W REFLEX MICROSCOPIC - Abnormal; Notable for the following components:   Color, Urine RED (*)    APPearance CLOUDY (*)    Glucose, UA   (*)    Value: TEST NOT REPORTED DUE TO COLOR INTERFERENCE OF URINE  PIGMENT   Hgb urine dipstick   (*)    Value: TEST NOT REPORTED DUE TO COLOR INTERFERENCE OF URINE PIGMENT   Bilirubin Urine   (*)    Value: TEST NOT REPORTED DUE TO COLOR INTERFERENCE OF URINE PIGMENT   Ketones, ur   (*)    Value: TEST NOT REPORTED DUE TO COLOR INTERFERENCE OF URINE PIGMENT   Protein, ur   (*)    Value: TEST NOT REPORTED DUE TO COLOR INTERFERENCE OF URINE PIGMENT   Nitrite   (*)    Value: TEST NOT REPORTED DUE TO COLOR INTERFERENCE OF URINE PIGMENT   Leukocytes,Ua   (*)    Value: TEST NOT REPORTED DUE TO COLOR INTERFERENCE OF URINE PIGMENT   Bacteria, UA RARE (*)    All other components within normal limits  URINE CULTURE     RADIOLOGY See ED course for details   PROCEDURES:  Critical Care performed: No  Procedures    IMPRESSION / MDM / ASSESSMENT AND PLAN / ED COURSE  I reviewed the triage vital signs and the nursing notes.                              Differential diagnosis includes, but is not limited to, cancer recurrence  Patient's presentation is most consistent with acute presentation with potential threat to life or bodily function.  Labs/studies ordered: Urinalysis, CMP, CBC with differential, CT renal stone study  Interventions/Medications given:  Medications - No data to display  (Note:  hospital course my include additional interventions and/or labs/studies not listed above.)   Patient without signs or symptoms of infection, I suspect the mild tachycardia triage was due to anxiety in the exertion of coming into the ED, but he is not tachycardic at rest and appears very comfortable.  Minimal discomfort on exam, no leukocytosis, stable CKD.  I will consult nephrology and hematology/oncology given that they are familiar with him and may have specific recommendations for emergent/urgent workup until the patient can follow-up as scheduled next week with his specialist.   Clinical Course as of 02/19/24 0047  Wed Feb 18, 2024  2316 Paged  Dr. Dennise to discuss [CF]  2316 I consulted with Dr. Dennise with nephrology.  He advised to check a urinalysis and a CT renal stone protocol to rule out infection and a stone respectively, but advised against getting a CT with IV contrast because it would not add much to the current clinical picture.  He felt the patient would be appropriate for discharge and  outpatient follow-up as scheduled if these studies are reassuring.  I am now consulting with Dr. Melanee to see if she has specific recommendations [CF]  2326 I consulted with Dr. Melanee who said she has no specific recommendations from an oncology standpoint, but strongly encourages me to touch base with urology to see if they have any additional recommendations for an emergent workup.  Paging Dr. Devere with urology [CF]  2329 I consulted with Dr. Devere with urology.  He agreed with the plan suggested by Dr. Dennise, which is to obtain a noncontrast renal stone protocol CT and verify that the patient has no evidence of infection on urinalysis.  He said that short of intervening due to high-grade obstruction there is nothing else urology has offered in the short-term and outpatient follow-up next week should be appropriate.  I am ordering the CT renal stone study and urinalysis has been sent. [CF]  Thu Feb 19, 2024  0026 Urinalysis, Routine w reflex microscopic -Urine, Clean Catch(!) Gross hematuria without evidence of infection.  I am sending a urine culture given his high risk [CF]  0027 CT Renal Stone Study I independently viewed and interpreted the patient's CT renal stone study I see no evidence of stone or hydronephrosis.  Radiology confirmed and mentioned stable findings in the right kidney.  No acute or emergent cause of hematuria identified [CF]  0044 I reassessed the patient and he has been stable, and has been able to urinate 2 more times since being here.  We talked about the reassuring results but his need for close outpatient follow-up  including his scheduled appointment with Dr. Melanee next week, his scheduled appointment with Dr. Camelia (radiation therapy) at the end of the month, and I strongly encouraged him to call the office of Dr. Douglas to schedule the next available follow-up with nephrology.  He and his wife said that they understand and agree with the plan. [CF]    Clinical Course User Index [CF] Gordan Huxley, MD     FINAL CLINICAL IMPRESSION(S) / ED DIAGNOSES   Final diagnoses:  Hematuria, unspecified type  Chronic kidney disease, unspecified CKD stage     Rx / DC Orders   ED Discharge Orders     None        Note:  This document was prepared using Dragon voice recognition software and may include unintentional dictation errors.   Gordan Huxley, MD 02/19/24 [phone]  "

## 2024-02-19 LAB — URINALYSIS, ROUTINE W REFLEX MICROSCOPIC
RBC / HPF: >50 RBC/hpf (ref 0–5)
Squamous Epithelial / HPF: 0 /HPF (ref 0–5)

## 2024-02-19 NOTE — Discharge Instructions (Signed)
 As we discussed, your evaluation tonight was stable and all of your specialist agree that given no evidence of an emergent condition tonight you are safe to go home and follow-up as an outpatient.  Please go to your scheduled appointments, but also call the office of Dr. Douglas to schedule the next available appointment to discuss your new hematuria and if he recommends that you get any additional outpatient evaluation, follow-up with urology, or something else.  Continue to drink plenty of fluids and take your regular medications.    Return to the emergency department if you develop new or worsening symptoms that concern you.

## 2024-02-20 LAB — URINE CULTURE: Culture: NO GROWTH

## 2024-02-20 NOTE — Telephone Encounter (Signed)
 Patient called in about a problem he is continuing to have. Patient states that he is urinating blood once again. He went to the hospital and they stated he was okay but he should contact his nephrologist. However I did tell this patient that his doctor is out of town and is not going to be able to be reached until next week. I recommended he call his urologist since that's what his nephrologist told him before. Patient said okay.

## 2024-02-25 ENCOUNTER — Inpatient Hospital Stay

## 2024-02-25 ENCOUNTER — Inpatient Hospital Stay: Attending: Oncology | Admitting: Oncology

## 2024-02-25 ENCOUNTER — Ambulatory Visit: Payer: Self-pay | Admitting: Oncology

## 2024-02-25 ENCOUNTER — Encounter: Payer: Self-pay | Admitting: Oncology

## 2024-02-25 VITALS — BP 132/80 | HR 61 | Temp 96.8°F | Resp 19 | Ht 69.0 in | Wt 210.4 lb

## 2024-02-25 DIAGNOSIS — C642 Malignant neoplasm of left kidney, except renal pelvis: Secondary | ICD-10-CM

## 2024-02-25 DIAGNOSIS — C641 Malignant neoplasm of right kidney, except renal pelvis: Secondary | ICD-10-CM | POA: Diagnosis not present

## 2024-02-25 LAB — CBC (CANCER CENTER ONLY)
HCT: 42.4 % (ref 39.0–52.0)
Hemoglobin: 14 g/dL (ref 13.0–17.0)
MCH: 26.2 pg (ref 26.0–34.0)
MCHC: 33 g/dL (ref 30.0–36.0)
MCV: 79.4 fL — ABNORMAL LOW (ref 80.0–100.0)
Platelet Count: 190 K/uL (ref 150–400)
RBC: 5.34 MIL/uL (ref 4.22–5.81)
RDW: 15.8 % — ABNORMAL HIGH (ref 11.5–15.5)
WBC Count: 6.6 K/uL (ref 4.0–10.5)
nRBC: 0 % (ref 0.0–0.2)

## 2024-02-25 LAB — FERRITIN: Ferritin: 40 ng/mL (ref 24–336)

## 2024-02-25 LAB — IRON AND TIBC
Iron: 103 ug/dL (ref 45–182)
Saturation Ratios: 26 % (ref 17.9–39.5)
TIBC: 402 ug/dL (ref 250–450)
UIBC: 299 ug/dL

## 2024-02-25 NOTE — Progress Notes (Signed)
 Patient states he started passing blood on NYE & still continuing to do so though it has lighten up some. He was told by the ED to see his urologist & is still awaiting medication from them. Patient is also still experiencing some pain in the kidney area. When pain is active, on a scale of 1-10 it is anywhere between a 3 & 6.

## 2024-02-25 NOTE — Progress Notes (Signed)
 "    Hematology/Oncology Consult note [facility]  Telephone:([phone] [phone]  Patient Care Team: Valora Lynwood FALCON, MD as PCP - General (Family Medicine) Sheldon Standing, MD as Consulting Physician (General Surgery) Renda Glance, MD as Consulting Physician (Urology) Cherilyn Debby CROME, MD as Consulting Physician (Endocrinology) Hester Wolm PARAS, MD as Consulting Physician (Cardiology) Melanee Annah BROCKS, MD as Consulting Physician (Oncology) Lenn Aran, MD as Consulting Physician (Radiation Oncology)   Name of the patient: Kenneth Osborn  [id number]  1955/08/10   Date of visit: 02/25/2024  Diagnosis-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   Patient had evidence of hematuria noted in July 2025 and underwent CT scan which showed hyperdense material in the right renal collecting systems suspecting hemorrhagic material.  No definite calcified stone.  Patient was then seen by urology and underwent an ultrasound which showed urinary bladder that was decompressed by Foley catheter.  He was subsequently referred to Dr. Renda and underwent right renal pelvic washing and brush biopsy of right renal pelvic tumor which was noted on right retrograde pyelography.  Renal tumor right pelvic biopsy showed  low-grade papillary urothelial carcinoma.  However cytology washings from the right pelvis showed high-grade urothelial carcinoma.  Patient has met with Dr. 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  He is therefore here to discuss alternative options if any  Interval history-patient completed SBRT to his right renal pelvis lesion end of December 2025.  Shortly following that he presented to the ER with symptoms of gross hematuria on 02/18/2024.  This was managed conservatively and hematuria is gradually getting better.  He does report having a hard time tolerating radiation and there were days when he was significantly fatigued and barely able to get out of bed.  Energy levels are improving but not back to baseline. History of Present Illness   ECOG PS- 1 Pain scale- 0 Opioid associated constipation- no  Review of systems- Review of Systems  Constitutional:  Positive for malaise/fatigue. Negative for chills, fever and weight loss.  HENT:  Negative for congestion, ear discharge and nosebleeds.   Eyes:  Negative for blurred vision.  Respiratory:  Negative for cough, hemoptysis, sputum production, shortness of breath and wheezing.   Cardiovascular:  Negative for chest pain, palpitations, orthopnea and claudication.  Gastrointestinal:  Negative for abdominal pain, blood in stool, constipation, diarrhea, heartburn, melena, nausea and vomiting.  Genitourinary:  Positive for hematuria. Negative for dysuria, flank pain, frequency and urgency.  Musculoskeletal:  Negative for back pain, joint pain and myalgias.  Skin:  Negative for rash.  Neurological:  Negative for dizziness, tingling, focal weakness, seizures, weakness and headaches.  Endo/Heme/Allergies:  Does not bruise/bleed easily.  Psychiatric/Behavioral:  Negative for depression and suicidal ideas. The patient does not have  insomnia.  Allergies[1]   Past Medical History:  Diagnosis Date   Allergic rhinitis 12/30/2012   Anginal pain    Bronchitis    hx of   Chronic back pain    thoracic area   Concussion 09/2015   Coronary artery disease    99% blockage   DDD (degenerative disc disease), cervical    Diabetes mellitus without complication (HCC)    insulin  dependent   Dysphagia    GERD (gastroesophageal reflux disease)    History of Meniere's disease 12/21/2014   History of thoracic spine surgery (S/P T9-10 IVD spacer) 12/21/2014   Hypercalcemia    Hypercholesteremia    Hyperkalemia    Hyperlipidemia    Hypertension    sees Dr. Norleen walker [facility]   Myocardial infarction [facility]) 2012   Se[facility] Dr. Jett[facility]   Neuromuscular disorder [facility])    diabetic neuropathy in feet   Proteinuria    Renal cell carcinoma (HCC)    Renal disorder    Secondary hyperparathyroidism of renal origin    Severe sepsis (HCC) 02/07/2020   Short-segment Barrett's esophagus    Sleep apnea    mild     Past Surgical History:  Procedure Laterality Date   APPENDECTOMY     age 4   ARTHRODESIS ANTERIOR ANTERIOR CERVICLE SPINE  01/04/2013   BACK SURGERY     fusion thoracic area   CARDIAC CATHETERIZATION     may 2012 and Nov 20, 2010   CARDIAC CATHETERIZATION N/A 06/15/2015   Procedure: Left Heart Cath and Coronary Angiography;  Surgeon: Wolm JINNY Rhyme, MD;  Location: [facility] INVASIVE CV LAB;  Service: Cardiovascular;  Laterality: N/A;   CARDIAC CATHETERIZATION N/A 06/15/2015   Procedure: Coronary Stent Intervention;  Surgeon: Marsa Dooms, MD;  Location: [facility] INVASIVE CV LAB;  Service: Cardiovascular;  Laterality: N/A;   CARDIOVASCULAR STRESS TEST     jan 2014   COLONOSCOPY WITH PROPOFOL  N/A 09/19/2014   Procedure: COLONOSCOPY WITH PROPOFOL ;  Surgeon: Lamar ONEIDA Holmes, MD;  Location: [facility] ENDOSCOPY;  Service: Endoscopy;  Laterality: N/A;   COLONOSCOPY WITH PROPOFOL  N/A 04/03/2021    Procedure: COLONOSCOPY WITH PROPOFOL ;  Surgeon: Maryruth Ole ONEIDA, MD;  Location: [facility] ENDOSCOPY;  Service: Endoscopy;  Laterality: N/A;  IDDM   COLONOSCOPY WITH PROPOFOL  N/A 06/25/2023   Procedure: COLONOSCOPY WITH PROPOFOL ;  Surgeon: Toledo, Ladell POUR, MD;  Location: [facility] ENDOSCOPY;  Service: Gastroenterology;  Laterality: N/A;  IDDM, 1ST CASE   CORONARY ANGIOPLASTY     stent placement x3   coronary stents      x 3 per pt   CYSTOSCOPY W/ RETROGRADES Bilateral 11/24/2023   Procedure: CYSTOSCOPY, WITH RETROGRADE PYELOGRAM;  Surgeon: Renda Glance, MD;  Location: [facility] ORS;  Service: Urology;  Laterality: Bilateral;   CYSTOSCOPY W/ URETERAL STENT PLACEMENT Right 08/28/2023   Procedure: CYSTOSCOPY, WITH RETROGRADE PYELOGRAM AND URETERAL STENT INSERTION;  Surgeon: Twylla Glendia BROCKS, MD;  Location: [facility] ORS;  Service: Urology;  Laterality: Right;  RIGHT STENT PLACEMENT, right RETROGRADE PYELOGRAM   CYSTOSCOPY WITH BIOPSY Right 11/24/2023   Procedure: CYSTOSCOPY, WITH RIGHT URETEROSCOPY, WASHING OF RIGHT RENAL PELVIS, BRUSH BIOPSY OF RIGHT RENAL PELVIS TUMOR, BIOPSY OF RIGHT RENAL PELVIC TUMOR, RIGHT URETERAL STENT EXCHANGE;  Surgeon: Renda Glance, MD;  Location: [facility] ORS;  Service: Urology;  Laterality: Right;   ESOPHAGOGASTRODUODENOSCOPY N/A 09/19/2014   Procedure: ESOPHAGOGASTRODUODENOSCOPY (EGD);  Surgeon: Lamar ONEIDA Holmes, MD;  Location: [facility] ENDOSCOPY;  Service: Endoscopy;  Laterality: N/A;   IR PERC CHOLECYSTOSTOMY  02/09/2020  IR RADIOLOGIST EVAL & MGMT  03/14/2020   LABRINTHECTOMY     1999 right ear   LUMBAR SPINAL CORD STIMULATOR LEAD REMOVAL Right 08/09/2019   Procedure: REMOVAL SPINAL CORD STIMULATOR PERCUTANEOUS LEADS, REMOVAL PULSE GENERATOR;  Surgeon: Bluford Standing, MD;  Location: [facility] ORS;  Service: Neurosurgery;  Laterality: Right;  LOCAL WITH MAC   mastoid shunt Bilateral    left, 2002, 1997 right ear, 1980 right ear   NEPHRECTOMY Left    PULSE GENERATOR IMPLANT N/A 01/18/2019    Procedure: MEDTRONIC SPINAL CORD STIMULATOR BATTERY EXCHANGE;  Surgeon: Bluford Standing, MD;  Location: [facility] ORS;  Service: Neurosurgery;  Laterality: N/A;   ROBOT ASSISTED LAPAROSCOPIC NEPHRECTOMY Left 04/20/2020   Procedure: XI ROBOTIC ASSITED LAPAROSCOPIC NEPHRECTOMY;  Surgeon: Renda Glance, MD;  Location: [facility] ORS;  Service: Urology;  Laterality: Left;   SAVORY DILATION N/A 09/19/2014   Procedure: SAVORY DILATION;  Surgeon: Lamar ONEIDA Holmes, MD;  Location: [facility] ENDOSCOPY;  Service: Endoscopy;  Laterality: N/A;   SHOULDER ARTHROSCOPY WITH SUBACROMIAL DECOMPRESSION Left 04/06/2012   Procedure: SHOULDER ARTHROSCOPY WITH SUBACROMIAL DECOMPRESSION;  Surgeon: Marcey Raman, MD;  Location: [facility] OR;  Service: Orthopedics;  Laterality: Left;  left shoulder arthroscopy, subacromial decompression and distal clavicle resection   SPINAL CORD STIMULATOR IMPLANT Right     Social History   Socioeconomic History   Marital status: Married    Spouse name: Heron   Number of children: 2   Years of education: Not on file   Highest education level: Not on file  Occupational History   Not on file  Tobacco Use   Smoking status: Never   Smokeless tobacco: Never  Vaping Use   Vaping status: Never Used  Substance and Sexual Activity   Alcohol use: No   Drug use: No   Sexual activity: Not Currently  Other Topics Concern   Not on file  Social History Narrative   Patient hospitalized x 3 events for high K+ Pt had surgery on the 02/09/20   Social Drivers of Health   Tobacco Use: Low Risk (02/25/2024)   Patient History    Smoking Tobacco Use: Never    Smokeless Tobacco Use: Never    Passive Exposure: Not on file  Financial Resource Strain: Low Risk  (08/21/2022)   Received from [facility]   Overall Financial Resource Strain (CARDIA)    Difficulty of Paying Living Expenses: Not very hard  Food Insecurity: No Food Insecurity (12/15/2023)   Epic    Worried About Running Out of Food in the  Last Year: Never true    Ran Out of Food in the Last Year: Never true  Transportation Needs: No Transportation Needs (12/15/2023)   Epic    Lack of Transportation (Medical): No    Lack of Transportation (Non-Medical): No  Physical Activity: Not on file  Stress: Not on file  Social Connections: Not on file  Intimate Partner Violence: Not At Risk (12/15/2023)   Epic    Fear of Current or Ex-Partner: No    Emotionally Abused: No    Physically Abused: No    Sexually Abused: No  Depression (PHQ2-9): Low Risk (02/25/2024)   Depression (PHQ2-9)    PHQ-2 Score: 0  Alcohol Screen: Not on file  Housing: Low Risk (12/15/2023)   Epic    Unable to Pay for Housing in the Last Year: No    Number of Times Moved in the Last Year: 0    Homeless in the Last Year: No  Utilities:  Not At Risk (12/15/2023)   Epic    Threatened with loss of utilities: No  Health Literacy: Not on file    Family History  Problem Relation Age of Onset   Heart disease Mother    Diabetes Mother    Heart disease Father    Cancer Sister    Heart disease Maternal Aunt    Heart disease Maternal Uncle    Diabetes Maternal Grandmother    Diabetes Paternal Grandmother     Current Medications[2]  Physical exam:  Vitals:   02/25/24 1147  BP: 132/80  Pulse: 61  Resp: 19  Temp: (!) 96.8 F (36 C)  TempSrc: Tympanic  SpO2: 98%  Weight: 210 lb 6.4 oz (95.4 kg)  Height: 5' 9 (1.753 m)   Physical Exam Constitutional:      Comments: Ambulates with a cane.  Appears in no acute distress  Cardiovascular:     Rate and Rhythm: Normal rate and regular rhythm.     Heart sounds: Normal heart sounds.  Pulmonary:     Effort: Pulmonary effort is normal.     Breath sounds: Normal breath sounds.  Skin:    General: Skin is warm and dry.  Neurological:     Mental Status: He is alert and oriented to person, place, and time.      I have personally reviewed labs listed below:    Latest Ref Rng & Units 02/18/2024   10:30  PM  CMP  Glucose 70 - 99 mg/dL 666   BUN 8 - 23 mg/dL 41   Creatinine 9.38 - 1.24 mg/dL 8.10   Sodium 864 - 854 mmol/L 138   Potassium 3.5 - 5.1 mmol/L 3.9   Chloride 98 - 111 mmol/L 100   CO2 22 - 32 mmol/L 22   Calcium  8.9 - 10.3 mg/dL 89.9   Total Protein 6.5 - 8.1 g/dL 6.9   Total Bilirubin 0.0 - 1.2 mg/dL <9.7   Alkaline Phos 38 - 126 U/L 106   AST 15 - 41 U/L 101   ALT 0 - 44 U/L 75       Latest Ref Rng & Units 02/25/2024   12:14 PM  CBC  WBC 4.0 - 10.5 K/uL 6.6   Hemoglobin 13.0 - 17.0 g/dL 85.9   Hematocrit 60.9 - 52.0 % 42.4   Platelets 150 - 400 K/uL 190    I have personally reviewed Radiology images listed below: No images are attached to the encounter.  CT Renal Stone Study Result Date: 02/19/2024 EXAM: CT ABDOMEN AND PELVIS WITHOUT CONTRAST 02/18/2024 11:45:09 PM TECHNIQUE: CT of the abdomen and pelvis was performed without the administration of intravenous contrast. Multiplanar reformatted images are provided for review. Automated exposure control, iterative reconstruction, and/or weight-based adjustment of the mA/kV was utilized to reduce the radiation dose to as low as reasonably achievable. COMPARISON: CT abdomen and pelvis 08/26/2023 and PET CT 12/17/2023. CLINICAL HISTORY: hematuria, hx of left nephrectomy and right kidney cancer. FINDINGS: LOWER CHEST: No acute abnormality. LIVER: The liver is unremarkable. GALLBLADDER AND BILE DUCTS: Gallbladder surgically absent. No biliary ductal dilatation. SPLEEN: No acute abnormality. PANCREAS: No acute abnormality. ADRENAL GLANDS: Left adrenal gland not seen. Right adrenal gland within normal limits. KIDNEYS, URETERS AND BLADDER: Left nephrectomy changes are again noted. There are 2 rounded subcentimeter hyperdense areas in the right kidney which have not significantly changed. Right ureteral stent has been removed in the interval. There is stable nonspecific right perinephric fat stranding. There is no hydronephrosis.  The bladder  is within normal limits. GI AND BOWEL: Stomach demonstrates no acute abnormality. The appendix is not visualized. There is no bowel obstruction. PERITONEUM AND RETROPERITONEUM: No ascites. No free air. VASCULATURE: Aorta is normal in caliber. There are atherosclerotic calcifications of the aorta. LYMPH NODES: No lymphadenopathy. REPRODUCTIVE ORGANS: No acute abnormality. BONES AND SOFT TISSUES: No acute osseous abnormality. There is some subcutaneous infiltration in the anterior abdominal wall which is unchanged from prior. IMPRESSION: 1. No hydronephrosis. 2. Stable 2 rounded subcentimeter hyperdense areas in the right kidney. 3. Stable nonspecific right perinephric fat stranding. 4. No evidence of metastatic disease. Electronically signed by: Greig Pique MD 02/19/2024 12:17 AM EST RP Workstation: [id number]     Assessment and plan- Patient is a 68 y.o. 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  He is s/p SBRT and here for routine follow-up  Patient was seen by Dr. Renda from urology and was offered right radical nephrectomy for his high-grade urothelial carcinoma.  That would make patient dialysis dependent and therefore patient did not wish to proceed with surgical option.  PET CT scan in October 2025 did not show any evidence of distant disease.  Given the presence of solitary right kidney potential for nephrotoxicity from neoadjuvant chemotherapy and unclear benefit in the presence of low tumor burden I did not feel that chemotherapy by itself would be a treatment option for the patient if he did not choose to proceed with surgery.  Patient met with Dr. Camelia from radiation oncology and underwent SBRT to the tumor in the right renal pelvis.  He completed this end of December 2025.  He has an appointment coming up with Dr. Renda next week.  Timing of the next retrograde  pyelography to be decided by Dr. Renda to see if he has responded to radiation or not and if he would still need salvage surgery should he not respond to radiation.  I will reach out to Dr. Renda next week after his visit and decide about follow-up with me accordingly  With regards to patient's fatigue I am checking his iron studies today given his ongoing intermittent hematuria.  If his iron levels are low patient is more interested in oral iron as compared to IV iron infusions.  He is not presently anemic   Visit Diagnosis 1. Urothelial carcinoma of kidney, right (HCC)      Dr. Annah Skene, MD, MPH [facility] [phone] 02/25/2024 1:01 PM                   [1] No Known Allergies [2]  Current Outpatient Medications:    acetaminophen  (TYLENOL ) 325 MG tablet, Take 1 tablet (325 mg total) by mouth every 6 (six) hours as needed for mild pain (pain score 1-3), fever or headache., Disp: , Rfl:    ALPRAZolam  (XANAX ) 0.5 MG tablet, Take 1 tablet (0.5 mg total) by mouth at bedtime., Disp: 30 tablet, Rfl: 0   cetirizine (ZYRTEC) 10 MG tablet, Take 10 mg by mouth daily. , Disp: , Rfl:    Continuous Glucose Sensor (FREESTYLE LIBRE 3 PLUS SENSOR) MISC, USE AS DIRECTED EVERY 15 DAYS, Disp: , Rfl:    cyclobenzaprine  (FLEXERIL ) 10 MG tablet, Take 1 tablet (10 mg total) by mouth 3 (three) times daily as needed for muscle spasms., Disp: 30 tablet, Rfl: 0   diazepam  (  VALIUM ) 5 MG tablet, Take 5 mg by mouth 3 (three) times daily as needed., Disp: , Rfl:    diltiazem  (CARDIZEM  CD) 240 MG 24 hr capsule, Take 240 mg by mouth daily., Disp: , Rfl:    esomeprazole (NEXIUM) 40 MG capsule, Take 40 mg by mouth 2 (two) times daily., Disp: , Rfl:    ezetimibe  (ZETIA ) 10 MG tablet, Take 10 mg by mouth daily., Disp: , Rfl:    gabapentin  (NEURONTIN ) 800 MG tablet, Take 800 mg by mouth 2 (two) times daily., Disp: , Rfl:    HUMULIN  R U-500 KWIKPEN 500 UNIT/ML KwikPen, Inject  65-75 Units into the skin See admin instructions. Sliding scale Can adjust according to blood sugar Home med, Disp: , Rfl:    isosorbide  mononitrate (IMDUR ) 30 MG 24 hr tablet, Take 30 mg by mouth daily., Disp: , Rfl:    JARDIANCE 25 MG TABS tablet, Take 25 mg by mouth daily., Disp: , Rfl:    latanoprost  (XALATAN ) 0.005 % ophthalmic solution, Place 1 drop into both eyes at bedtime., Disp: , Rfl:    lubiprostone  (AMITIZA ) 24 MCG capsule, Take 24 mcg by mouth 2 (two) times daily with a meal., Disp: , Rfl:    Magnesium  Oxide 250 MG TABS, Take 500 mg by mouth daily., Disp: , Rfl:    meclizine  (ANTIVERT ) 25 MG tablet, Take 25 mg by mouth See admin instructions. Take 25 mg in the morning, and additional 25 mg if needed up to a total of three per day for Meniere's disease, Disp: , Rfl:    metFORMIN  (GLUCOPHAGE ) 1000 MG tablet, Take 1,000 mg by mouth 2 (two) times daily., Disp: , Rfl:    metoprolol  succinate (TOPROL -XL) 100 MG 24 hr tablet, Take 100 mg by mouth 2 (two) times daily. Take with or immediately following a meal., Disp: , Rfl:    montelukast  (SINGULAIR ) 10 MG tablet, Take 10 mg by mouth at bedtime. , Disp: , Rfl:    MOVANTIK 25 MG TABS tablet, Take 25 mg by mouth every morning., Disp: , Rfl:    multivitamin (RENA-VIT) TABS tablet, Take 1 tablet by mouth at bedtime., Disp: 30 tablet, Rfl: 0   nitroGLYCERIN  (NITROSTAT ) 0.4 MG SL tablet, Place 0.4 mg under the tongue as needed., Disp: , Rfl:    oxybutynin  (DITROPAN ) 5 MG tablet, Take 1 tablet (5 mg total) by mouth every 8 (eight) hours as needed for bladder spasms., Disp: 30 tablet, Rfl: 0   oxyCODONE  (OXY IR/ROXICODONE ) 5 MG immediate release tablet, Take 1 tablet (5 mg total) by mouth every 6 (six) hours as needed for severe pain (pain score 7-10). Must last 30 days., Disp: 120 tablet, Rfl: 0   prasugrel  (EFFIENT ) 10 MG TABS tablet, Take 10 mg by mouth daily., Disp: , Rfl:    rosuvastatin  (CRESTOR ) 40 MG tablet, Take 40 mg by mouth at bedtime.,  Disp: , Rfl:    senna-docusate (SENOKOT-S) 8.6-50 MG tablet, Take 1 tablet by mouth 2 (two) times daily., Disp: 30 tablet, Rfl: 0   [Paused] chlorthalidone (HYGROTON) 25 MG tablet, Take 25 mg by mouth daily., Disp: , Rfl:    naloxone  (NARCAN ) nasal spray 4 mg/0.1 mL, Place 1 spray into the nose as needed for up to 365 doses (for opioid-induced respiratory depresssion). In case of emergency (overdose), spray once into each nostril. If no response within 3 minutes, repeat application and call 911., Disp: 1 each, Rfl: 0   niacin  (NIASPAN ) 500 MG CR tablet, Take 500 mg  by mouth at bedtime., Disp: , Rfl:    oxyCODONE  (OXY IR/ROXICODONE ) 5 MG immediate release tablet, Take 1 tablet (5 mg total) by mouth every 6 (six) hours as needed for severe pain (pain score 7-10). Must last 30 days., Disp: 120 tablet, Rfl: 0   [START ON 03/18/2024] oxyCODONE  (OXY IR/ROXICODONE ) 5 MG immediate release tablet, Take 1 tablet (5 mg total) by mouth every 6 (six) hours as needed for severe pain (pain score 7-10). Must last 30 days., Disp: 120 tablet, Rfl: 0   phenazopyridine  (PYRIDIUM ) 200 MG tablet, Take 1 tablet (200 mg total) by mouth 3 (three) times daily as needed for pain., Disp: 30 tablet, Rfl: 1   tamsulosin  (FLOMAX ) 0.4 MG CAPS capsule, Take 1 capsule (0.4 mg total) by mouth daily after supper. (Patient not taking: Reported on 02/25/2024), Disp: 30 capsule, Rfl: 0  "

## 2024-02-27 NOTE — Telephone Encounter (Signed)
 Called patient; per Dr. Darold request to let him know that his iron studies are normal but ferritin is still mildly low. Patient verbalized understanding & agreeable to taking iron tablets every other day.

## 2024-03-15 ENCOUNTER — Ambulatory Visit: Admitting: Radiation Oncology

## 2024-03-25 ENCOUNTER — Other Ambulatory Visit: Payer: Self-pay | Admitting: Urology

## 2024-03-31 ENCOUNTER — Ambulatory Visit: Admitting: Radiation Oncology

## 2024-03-31 ENCOUNTER — Encounter: Admitting: Nurse Practitioner

## 2024-05-03 ENCOUNTER — Ambulatory Visit (HOSPITAL_COMMUNITY): Admit: 2024-05-03 | Admitting: Urology
